# Patient Record
Sex: Male | Born: 1940 | Race: White | Hispanic: No | Smoking: Former smoker
Health system: Southern US, Community
[De-identification: ages and names within clinical notes are randomized; demographics above are authoritative.]

## PROBLEM LIST (undated history)

## (undated) ENCOUNTER — Emergency Department (HOSPITAL_COMMUNITY): Admission: EM | Payer: Medicare HMO

## (undated) DIAGNOSIS — H6090 Unspecified otitis externa, unspecified ear: Secondary | ICD-10-CM

## (undated) DIAGNOSIS — L97509 Non-pressure chronic ulcer of other part of unspecified foot with unspecified severity: Secondary | ICD-10-CM

## (undated) DIAGNOSIS — N400 Enlarged prostate without lower urinary tract symptoms: Secondary | ICD-10-CM

## (undated) DIAGNOSIS — D509 Iron deficiency anemia, unspecified: Secondary | ICD-10-CM

## (undated) DIAGNOSIS — K5790 Diverticulosis of intestine, part unspecified, without perforation or abscess without bleeding: Secondary | ICD-10-CM

## (undated) DIAGNOSIS — F32A Depression, unspecified: Secondary | ICD-10-CM

## (undated) DIAGNOSIS — M545 Low back pain, unspecified: Secondary | ICD-10-CM

## (undated) DIAGNOSIS — I10 Essential (primary) hypertension: Secondary | ICD-10-CM

## (undated) DIAGNOSIS — R161 Splenomegaly, not elsewhere classified: Secondary | ICD-10-CM

## (undated) DIAGNOSIS — M1990 Unspecified osteoarthritis, unspecified site: Secondary | ICD-10-CM

## (undated) DIAGNOSIS — I868 Varicose veins of other specified sites: Secondary | ICD-10-CM

## (undated) DIAGNOSIS — I509 Heart failure, unspecified: Secondary | ICD-10-CM

## (undated) DIAGNOSIS — I8500 Esophageal varices without bleeding: Secondary | ICD-10-CM

## (undated) DIAGNOSIS — A419 Sepsis, unspecified organism: Secondary | ICD-10-CM

## (undated) DIAGNOSIS — K7460 Unspecified cirrhosis of liver: Secondary | ICD-10-CM

## (undated) DIAGNOSIS — J449 Chronic obstructive pulmonary disease, unspecified: Secondary | ICD-10-CM

## (undated) DIAGNOSIS — N179 Acute kidney failure, unspecified: Secondary | ICD-10-CM

## (undated) DIAGNOSIS — J189 Pneumonia, unspecified organism: Secondary | ICD-10-CM

## (undated) DIAGNOSIS — D696 Thrombocytopenia, unspecified: Secondary | ICD-10-CM

## (undated) DIAGNOSIS — K3189 Other diseases of stomach and duodenum: Secondary | ICD-10-CM

## (undated) DIAGNOSIS — F329 Major depressive disorder, single episode, unspecified: Secondary | ICD-10-CM

## (undated) DIAGNOSIS — E785 Hyperlipidemia, unspecified: Secondary | ICD-10-CM

## (undated) DIAGNOSIS — K760 Fatty (change of) liver, not elsewhere classified: Secondary | ICD-10-CM

## (undated) DIAGNOSIS — R05 Cough: Secondary | ICD-10-CM

## (undated) DIAGNOSIS — K652 Spontaneous bacterial peritonitis: Secondary | ICD-10-CM

## (undated) DIAGNOSIS — K259 Gastric ulcer, unspecified as acute or chronic, without hemorrhage or perforation: Secondary | ICD-10-CM

## (undated) DIAGNOSIS — I700 Atherosclerosis of aorta: Secondary | ICD-10-CM

## (undated) DIAGNOSIS — E119 Type 2 diabetes mellitus without complications: Secondary | ICD-10-CM

## (undated) DIAGNOSIS — C220 Liver cell carcinoma: Secondary | ICD-10-CM

## (undated) DIAGNOSIS — K648 Other hemorrhoids: Secondary | ICD-10-CM

## (undated) DIAGNOSIS — C229 Malignant neoplasm of liver, not specified as primary or secondary: Secondary | ICD-10-CM

## (undated) DIAGNOSIS — Z5189 Encounter for other specified aftercare: Secondary | ICD-10-CM

## (undated) HISTORY — DX: Unspecified cirrhosis of liver: K74.60

## (undated) HISTORY — DX: Chronic obstructive pulmonary disease, unspecified: J44.9

## (undated) HISTORY — DX: Fatty (change of) liver, not elsewhere classified: K76.0

## (undated) HISTORY — DX: Heart failure, unspecified: I50.9

## (undated) HISTORY — DX: Esophageal varices without bleeding: I85.00

## (undated) HISTORY — DX: Unspecified osteoarthritis, unspecified site: M19.90

## (undated) HISTORY — DX: Spontaneous bacterial peritonitis: K65.2

## (undated) HISTORY — DX: Essential (primary) hypertension: I10

## (undated) HISTORY — DX: Liver cell carcinoma: C22.0

## (undated) HISTORY — DX: Splenomegaly, not elsewhere classified: R16.1

## (undated) HISTORY — DX: Encounter for other specified aftercare: Z51.89

## (undated) HISTORY — DX: Cough: R05

## (undated) HISTORY — PX: EYE SURGERY: SHX253

## (undated) HISTORY — DX: Non-pressure chronic ulcer of other part of unspecified foot with unspecified severity: L97.509

## (undated) HISTORY — DX: Benign prostatic hyperplasia without lower urinary tract symptoms: N40.0

## (undated) HISTORY — DX: Depression, unspecified: F32.A

## (undated) HISTORY — DX: Low back pain, unspecified: M54.50

## (undated) HISTORY — DX: Varicose veins of other specified sites: I86.8

## (undated) HISTORY — DX: Hyperlipidemia, unspecified: E78.5

## (undated) HISTORY — DX: Iron deficiency anemia, unspecified: D50.9

## (undated) HISTORY — DX: Atherosclerosis of aorta: I70.0

## (undated) HISTORY — DX: Type 2 diabetes mellitus without complications: E11.9

## (undated) HISTORY — DX: Diverticulosis of intestine, part unspecified, without perforation or abscess without bleeding: K57.90

## (undated) HISTORY — DX: Sepsis, unspecified organism: A41.9

## (undated) HISTORY — DX: Gastric ulcer, unspecified as acute or chronic, without hemorrhage or perforation: K25.9

## (undated) HISTORY — DX: Unspecified otitis externa, unspecified ear: H60.90

## (undated) HISTORY — DX: Low back pain: M54.5

## (undated) HISTORY — DX: Malignant neoplasm of liver, not specified as primary or secondary: C22.9

## (undated) HISTORY — DX: Thrombocytopenia, unspecified: D69.6

## (undated) HISTORY — DX: Other hemorrhoids: K64.8

## (undated) HISTORY — DX: Major depressive disorder, single episode, unspecified: F32.9

## (undated) HISTORY — DX: Other diseases of stomach and duodenum: K31.89

---

## 1978-10-10 HISTORY — PX: OTHER SURGICAL HISTORY: SHX169

## 2000-09-29 ENCOUNTER — Emergency Department (HOSPITAL_COMMUNITY): Admission: EM | Admit: 2000-09-29 | Discharge: 2000-09-29 | Payer: Self-pay | Admitting: Emergency Medicine

## 2000-09-29 ENCOUNTER — Encounter: Payer: Self-pay | Admitting: Emergency Medicine

## 2001-08-01 ENCOUNTER — Encounter: Payer: Self-pay | Admitting: Occupational Medicine

## 2001-08-01 ENCOUNTER — Encounter: Admission: RE | Admit: 2001-08-01 | Discharge: 2001-08-01 | Payer: Self-pay | Admitting: Occupational Medicine

## 2004-08-17 ENCOUNTER — Ambulatory Visit: Payer: Self-pay | Admitting: Internal Medicine

## 2004-12-30 ENCOUNTER — Ambulatory Visit: Payer: Self-pay | Admitting: Internal Medicine

## 2005-01-06 ENCOUNTER — Ambulatory Visit: Payer: Self-pay | Admitting: Internal Medicine

## 2005-07-08 ENCOUNTER — Ambulatory Visit: Payer: Self-pay | Admitting: Internal Medicine

## 2005-07-22 ENCOUNTER — Ambulatory Visit: Payer: Self-pay

## 2005-07-22 ENCOUNTER — Encounter: Payer: Self-pay | Admitting: Internal Medicine

## 2005-08-08 ENCOUNTER — Ambulatory Visit: Payer: Self-pay | Admitting: Internal Medicine

## 2005-10-11 ENCOUNTER — Ambulatory Visit: Payer: Self-pay | Admitting: Internal Medicine

## 2005-12-26 ENCOUNTER — Ambulatory Visit: Payer: Self-pay | Admitting: Internal Medicine

## 2006-01-02 ENCOUNTER — Ambulatory Visit: Payer: Self-pay | Admitting: Internal Medicine

## 2006-01-10 ENCOUNTER — Ambulatory Visit: Payer: Self-pay | Admitting: Internal Medicine

## 2006-04-03 ENCOUNTER — Ambulatory Visit: Payer: Self-pay | Admitting: Internal Medicine

## 2006-05-31 ENCOUNTER — Ambulatory Visit: Payer: Self-pay | Admitting: Internal Medicine

## 2006-06-06 ENCOUNTER — Ambulatory Visit: Payer: Self-pay | Admitting: Internal Medicine

## 2006-09-06 ENCOUNTER — Ambulatory Visit: Payer: Self-pay | Admitting: Internal Medicine

## 2006-11-14 ENCOUNTER — Ambulatory Visit: Payer: Self-pay | Admitting: Internal Medicine

## 2006-12-22 ENCOUNTER — Ambulatory Visit: Payer: Self-pay | Admitting: Gastroenterology

## 2006-12-27 ENCOUNTER — Ambulatory Visit: Payer: Self-pay | Admitting: Internal Medicine

## 2006-12-27 LAB — CONVERTED CEMR LAB
AST: 30 units/L (ref 0–37)
Alkaline Phosphatase: 64 units/L (ref 39–117)
BUN: 7 mg/dL (ref 6–23)
Basophils Relative: 0.2 % (ref 0.0–1.0)
Bilirubin, Direct: 0.1 mg/dL (ref 0.0–0.3)
CO2: 33 meq/L — ABNORMAL HIGH (ref 19–32)
Chloride: 98 meq/L (ref 96–112)
Eosinophils Absolute: 0.1 10*3/uL (ref 0.0–0.6)
Glucose, Bld: 118 mg/dL — ABNORMAL HIGH (ref 70–99)
HCT: 41.8 % (ref 39.0–52.0)
HDL: 35.8 mg/dL — ABNORMAL LOW (ref >39.0)
Hemoglobin: 14.5 g/dL (ref 13.0–17.0)
Hgb A1c MFr Bld: 6 % (ref 4.6–6.0)
MCHC: 34.7 g/dL (ref 30.0–36.0)
Microalb Creat Ratio: 9.8 mg/g (ref 0.0–30.0)
Neutro Abs: 3.3 10*3/uL (ref 1.4–7.7)
Neutrophils Relative %: 53.4 % (ref 43.0–77.0)
PSA: 1.78 ng/mL (ref 0.10–4.00)
Sodium: 136 meq/L (ref 135–145)
TSH: 1.08 microintl units/mL (ref 0.35–5.50)
Total CHOL/HDL Ratio: 3.4
Triglycerides: 155 mg/dL — ABNORMAL HIGH (ref 0–149)
WBC: 6 10*3/uL (ref 4.5–10.5)

## 2007-01-03 ENCOUNTER — Encounter: Payer: Self-pay | Admitting: Internal Medicine

## 2007-01-04 ENCOUNTER — Ambulatory Visit: Payer: Self-pay | Admitting: Internal Medicine

## 2007-04-06 DIAGNOSIS — E785 Hyperlipidemia, unspecified: Secondary | ICD-10-CM

## 2007-04-06 DIAGNOSIS — M1990 Unspecified osteoarthritis, unspecified site: Secondary | ICD-10-CM | POA: Insufficient documentation

## 2007-04-06 DIAGNOSIS — E1149 Type 2 diabetes mellitus with other diabetic neurological complication: Secondary | ICD-10-CM

## 2007-04-06 DIAGNOSIS — I10 Essential (primary) hypertension: Secondary | ICD-10-CM

## 2007-04-06 DIAGNOSIS — E1169 Type 2 diabetes mellitus with other specified complication: Secondary | ICD-10-CM

## 2007-04-26 ENCOUNTER — Ambulatory Visit: Payer: Self-pay | Admitting: Gastroenterology

## 2007-05-02 ENCOUNTER — Ambulatory Visit: Payer: Self-pay | Admitting: Internal Medicine

## 2007-05-02 DIAGNOSIS — M549 Dorsalgia, unspecified: Secondary | ICD-10-CM

## 2007-05-02 DIAGNOSIS — F329 Major depressive disorder, single episode, unspecified: Secondary | ICD-10-CM

## 2007-05-02 DIAGNOSIS — G8929 Other chronic pain: Secondary | ICD-10-CM

## 2007-05-03 ENCOUNTER — Ambulatory Visit: Payer: Self-pay

## 2007-05-03 ENCOUNTER — Encounter: Payer: Self-pay | Admitting: Internal Medicine

## 2007-05-18 ENCOUNTER — Ambulatory Visit: Payer: Self-pay | Admitting: Gastroenterology

## 2007-06-20 ENCOUNTER — Ambulatory Visit: Payer: Self-pay | Admitting: Internal Medicine

## 2007-06-20 DIAGNOSIS — N400 Enlarged prostate without lower urinary tract symptoms: Secondary | ICD-10-CM | POA: Insufficient documentation

## 2007-08-30 ENCOUNTER — Ambulatory Visit: Payer: Self-pay | Admitting: Internal Medicine

## 2007-08-30 DIAGNOSIS — M545 Low back pain: Secondary | ICD-10-CM

## 2007-08-30 DIAGNOSIS — G8929 Other chronic pain: Secondary | ICD-10-CM | POA: Insufficient documentation

## 2007-09-20 ENCOUNTER — Encounter: Payer: Self-pay | Admitting: Internal Medicine

## 2007-10-26 ENCOUNTER — Encounter: Payer: Self-pay | Admitting: Internal Medicine

## 2007-12-03 ENCOUNTER — Ambulatory Visit: Payer: Self-pay | Admitting: Internal Medicine

## 2007-12-03 LAB — CONVERTED CEMR LAB
Cholesterol, target level: 200 mg/dL
HDL goal, serum: 40 mg/dL

## 2008-01-25 ENCOUNTER — Telehealth: Payer: Self-pay | Admitting: *Deleted

## 2008-01-28 ENCOUNTER — Ambulatory Visit: Payer: Self-pay | Admitting: Internal Medicine

## 2008-01-28 LAB — CONVERTED CEMR LAB
Cholesterol: 193 mg/dL (ref 0–200)
LDL Cholesterol: 135 mg/dL — ABNORMAL HIGH (ref 0–99)
Triglycerides: 148 mg/dL (ref 0–149)

## 2008-02-07 ENCOUNTER — Ambulatory Visit: Payer: Self-pay | Admitting: Internal Medicine

## 2008-02-07 DIAGNOSIS — M1710 Unilateral primary osteoarthritis, unspecified knee: Secondary | ICD-10-CM | POA: Insufficient documentation

## 2008-02-14 ENCOUNTER — Encounter: Admission: RE | Admit: 2008-02-14 | Discharge: 2008-02-14 | Payer: Self-pay | Admitting: Internal Medicine

## 2008-02-19 ENCOUNTER — Encounter: Payer: Self-pay | Admitting: Internal Medicine

## 2008-03-13 ENCOUNTER — Ambulatory Visit: Payer: Self-pay | Admitting: Internal Medicine

## 2008-03-18 ENCOUNTER — Encounter: Payer: Self-pay | Admitting: Internal Medicine

## 2008-05-14 ENCOUNTER — Ambulatory Visit: Payer: Self-pay | Admitting: Internal Medicine

## 2008-05-14 LAB — CONVERTED CEMR LAB
Blood in Urine, dipstick: NEGATIVE
CO2: 27 meq/L (ref 19–32)
Chloride: 101 meq/L (ref 96–112)
Glucose, Bld: 122 mg/dL — ABNORMAL HIGH (ref 70–99)
Potassium: 4.2 meq/L (ref 3.5–5.1)
Sodium: 137 meq/L (ref 135–145)
Urobilinogen, UA: 1

## 2008-06-25 ENCOUNTER — Encounter: Payer: Self-pay | Admitting: Internal Medicine

## 2008-07-17 ENCOUNTER — Ambulatory Visit: Payer: Self-pay | Admitting: Internal Medicine

## 2008-08-14 ENCOUNTER — Ambulatory Visit: Payer: Self-pay | Admitting: Internal Medicine

## 2008-08-21 ENCOUNTER — Ambulatory Visit: Payer: Self-pay | Admitting: Internal Medicine

## 2008-08-28 ENCOUNTER — Ambulatory Visit: Payer: Self-pay | Admitting: Internal Medicine

## 2008-09-24 ENCOUNTER — Encounter: Payer: Self-pay | Admitting: Internal Medicine

## 2008-10-01 ENCOUNTER — Ambulatory Visit: Payer: Self-pay | Admitting: Internal Medicine

## 2008-10-01 DIAGNOSIS — T887XXA Unspecified adverse effect of drug or medicament, initial encounter: Secondary | ICD-10-CM

## 2008-10-01 DIAGNOSIS — R0609 Other forms of dyspnea: Secondary | ICD-10-CM

## 2008-10-01 DIAGNOSIS — R0989 Other specified symptoms and signs involving the circulatory and respiratory systems: Secondary | ICD-10-CM

## 2008-10-01 LAB — CONVERTED CEMR LAB
ALT: 18 units/L (ref 0–53)
AST: 26 units/L (ref 0–37)
Albumin: 3.3 g/dL — ABNORMAL LOW (ref 3.5–5.2)
BUN: 9 mg/dL (ref 6–23)
Basophils Absolute: 0.1 10*3/uL (ref 0.0–0.1)
Basophils Relative: 1 % (ref 0.0–3.0)
CO2: 31 meq/L (ref 19–32)
Calcium: 9.2 mg/dL (ref 8.4–10.5)
Creatinine, Ser: 0.7 mg/dL (ref 0.4–1.5)
Eosinophils Absolute: 0.1 10*3/uL (ref 0.0–0.7)
Eosinophils Relative: 1.7 % (ref 0.0–5.0)
GFR calc non Af Amer: 120 mL/min
HCT: 36.9 % — ABNORMAL LOW (ref 39.0–52.0)
Hemoglobin: 12.7 g/dL — ABNORMAL LOW (ref 13.0–17.0)
Hgb A1c MFr Bld: 6.7 % — ABNORMAL HIGH (ref 4.6–6.0)
MCHC: 34.3 g/dL (ref 30.0–36.0)
MCV: 95.4 fL (ref 78.0–100.0)
Neutro Abs: 4.9 10*3/uL (ref 1.4–7.7)
Pro B Natriuretic peptide (BNP): 24 pg/mL (ref 0.0–100.0)
RBC: 3.87 M/uL — ABNORMAL LOW (ref 4.22–5.81)
Total Bilirubin: 0.7 mg/dL (ref 0.3–1.2)
WBC: 7.2 10*3/uL (ref 4.5–10.5)

## 2008-10-08 ENCOUNTER — Encounter: Payer: Self-pay | Admitting: Internal Medicine

## 2008-10-08 ENCOUNTER — Ambulatory Visit: Payer: Self-pay

## 2008-10-14 ENCOUNTER — Encounter: Payer: Self-pay | Admitting: *Deleted

## 2008-11-12 ENCOUNTER — Ambulatory Visit: Payer: Self-pay | Admitting: Internal Medicine

## 2009-01-22 ENCOUNTER — Ambulatory Visit: Payer: Self-pay | Admitting: Internal Medicine

## 2009-01-22 LAB — CONVERTED CEMR LAB
BUN: 8 mg/dL (ref 6–23)
CO2: 31 meq/L (ref 19–32)
Calcium: 9 mg/dL (ref 8.4–10.5)
Chloride: 98 meq/L (ref 96–112)
Cholesterol: 183 mg/dL (ref 0–200)
Creatinine, Ser: 0.7 mg/dL (ref 0.4–1.5)

## 2009-05-06 ENCOUNTER — Ambulatory Visit: Payer: Self-pay | Admitting: Internal Medicine

## 2009-06-03 ENCOUNTER — Ambulatory Visit: Payer: Self-pay | Admitting: Internal Medicine

## 2009-06-03 DIAGNOSIS — J180 Bronchopneumonia, unspecified organism: Secondary | ICD-10-CM | POA: Insufficient documentation

## 2009-06-22 ENCOUNTER — Encounter: Admission: RE | Admit: 2009-06-22 | Discharge: 2009-06-22 | Payer: Self-pay | Admitting: Internal Medicine

## 2009-07-06 ENCOUNTER — Encounter: Admission: RE | Admit: 2009-07-06 | Discharge: 2009-07-06 | Payer: Self-pay | Admitting: Internal Medicine

## 2009-07-30 ENCOUNTER — Ambulatory Visit: Payer: Self-pay | Admitting: Internal Medicine

## 2009-07-30 LAB — CONVERTED CEMR LAB
BUN: 11 mg/dL (ref 6–23)
Basophils Absolute: 0 10*3/uL (ref 0.0–0.1)
CO2: 27 meq/L (ref 19–32)
Calcium: 8.9 mg/dL (ref 8.4–10.5)
Creatinine, Ser: 0.8 mg/dL (ref 0.4–1.5)
Eosinophils Absolute: 0.1 10*3/uL (ref 0.0–0.7)
GFR calc non Af Amer: 102.02 mL/min (ref >60)
Glucose, Bld: 144 mg/dL — ABNORMAL HIGH (ref 70–99)
Lymphocytes Relative: 33.1 % (ref 12.0–46.0)
MCHC: 34 g/dL (ref 30.0–36.0)
Monocytes Relative: 6.6 % (ref 3.0–12.0)
Neutro Abs: 3.6 10*3/uL (ref 1.4–7.7)
Neutrophils Relative %: 58.8 % (ref 43.0–77.0)
Platelets: 187 10*3/uL (ref 150.0–400.0)
RDW: 15.6 % — ABNORMAL HIGH (ref 11.5–14.6)
Sodium: 137 meq/L (ref 135–145)

## 2009-08-04 ENCOUNTER — Ambulatory Visit: Payer: Self-pay | Admitting: Cardiology

## 2009-08-12 ENCOUNTER — Ambulatory Visit: Payer: Self-pay | Admitting: Internal Medicine

## 2009-08-12 DIAGNOSIS — R059 Cough, unspecified: Secondary | ICD-10-CM | POA: Insufficient documentation

## 2009-08-12 DIAGNOSIS — R05 Cough: Secondary | ICD-10-CM

## 2009-08-31 ENCOUNTER — Ambulatory Visit: Payer: Self-pay | Admitting: Internal Medicine

## 2009-09-21 ENCOUNTER — Ambulatory Visit: Payer: Self-pay | Admitting: Internal Medicine

## 2009-09-22 ENCOUNTER — Telehealth (INDEPENDENT_AMBULATORY_CARE_PROVIDER_SITE_OTHER): Payer: Self-pay | Admitting: *Deleted

## 2009-10-27 ENCOUNTER — Telehealth: Payer: Self-pay | Admitting: Internal Medicine

## 2009-11-02 ENCOUNTER — Ambulatory Visit: Payer: Self-pay | Admitting: Internal Medicine

## 2009-11-03 ENCOUNTER — Ambulatory Visit: Payer: Self-pay | Admitting: Internal Medicine

## 2009-12-09 ENCOUNTER — Encounter: Payer: Self-pay | Admitting: Internal Medicine

## 2009-12-15 ENCOUNTER — Ambulatory Visit: Payer: Self-pay | Admitting: Internal Medicine

## 2009-12-15 DIAGNOSIS — J449 Chronic obstructive pulmonary disease, unspecified: Secondary | ICD-10-CM

## 2009-12-15 HISTORY — DX: Chronic obstructive pulmonary disease, unspecified: J44.9

## 2009-12-17 ENCOUNTER — Encounter: Payer: Self-pay | Admitting: Internal Medicine

## 2009-12-18 ENCOUNTER — Ambulatory Visit: Payer: Self-pay | Admitting: Cardiology

## 2010-01-04 ENCOUNTER — Ambulatory Visit: Payer: Self-pay | Admitting: Internal Medicine

## 2010-01-04 LAB — CONVERTED CEMR LAB
ALT: 18 units/L (ref 0–53)
AST: 25 units/L (ref 0–37)
Albumin: 3.7 g/dL (ref 3.5–5.2)
Alkaline Phosphatase: 82 units/L (ref 39–117)
BUN: 10 mg/dL (ref 6–23)
Bilirubin, Direct: 0 mg/dL (ref 0.0–0.3)
CO2: 30 meq/L (ref 19–32)
Calcium: 9.1 mg/dL (ref 8.4–10.5)
Chloride: 106 meq/L (ref 96–112)
Cholesterol: 182 mg/dL (ref 0–200)
Creatinine, Ser: 0.8 mg/dL (ref 0.4–1.5)
Direct LDL: 117.8 mg/dL
GFR calc non Af Amer: 101.89 mL/min (ref >60)
Glucose, Bld: 96 mg/dL (ref 70–99)
HDL: 35.4 mg/dL — ABNORMAL LOW (ref >39.00)
Hgb A1c MFr Bld: 6 % (ref 4.6–6.5)
PSA: 2.15 ng/mL (ref 0.10–4.00)
Potassium: 4.8 meq/L (ref 3.5–5.1)
Sodium: 143 meq/L (ref 135–145)
TSH: 4.18 microintl units/mL (ref 0.35–5.50)
Total Bilirubin: 0.4 mg/dL (ref 0.3–1.2)
Total CHOL/HDL Ratio: 5
Total Protein: 6.6 g/dL (ref 6.0–8.3)
Triglycerides: 241 mg/dL — ABNORMAL HIGH (ref 0.0–149.0)
VLDL: 48.2 mg/dL — ABNORMAL HIGH (ref 0.0–40.0)

## 2010-01-11 ENCOUNTER — Ambulatory Visit: Payer: Self-pay | Admitting: Internal Medicine

## 2010-02-11 ENCOUNTER — Ambulatory Visit: Payer: Self-pay | Admitting: Internal Medicine

## 2010-02-11 ENCOUNTER — Telehealth: Payer: Self-pay | Admitting: Internal Medicine

## 2010-03-10 ENCOUNTER — Telehealth: Payer: Self-pay | Admitting: Internal Medicine

## 2010-03-18 ENCOUNTER — Ambulatory Visit: Payer: Self-pay | Admitting: Internal Medicine

## 2010-04-23 ENCOUNTER — Ambulatory Visit: Payer: Self-pay | Admitting: Internal Medicine

## 2010-04-23 LAB — CONVERTED CEMR LAB
CO2: 30 meq/L (ref 19–32)
Calcium: 8.8 mg/dL (ref 8.4–10.5)
Creatinine, Ser: 1.2 mg/dL (ref 0.4–1.5)
GFR calc non Af Amer: 65.01 mL/min (ref >60)
Glucose, Bld: 77 mg/dL (ref 70–99)

## 2010-06-28 ENCOUNTER — Telehealth: Payer: Self-pay | Admitting: Internal Medicine

## 2010-07-26 ENCOUNTER — Telehealth: Payer: Self-pay | Admitting: Internal Medicine

## 2010-08-11 ENCOUNTER — Ambulatory Visit: Payer: Self-pay | Admitting: Internal Medicine

## 2010-08-11 ENCOUNTER — Encounter: Payer: Self-pay | Admitting: Internal Medicine

## 2010-09-18 ENCOUNTER — Encounter: Payer: Self-pay | Admitting: *Deleted

## 2010-09-28 ENCOUNTER — Encounter: Payer: Self-pay | Admitting: Internal Medicine

## 2010-10-22 ENCOUNTER — Encounter: Payer: Self-pay | Admitting: *Deleted

## 2010-11-11 NOTE — Assessment & Plan Note (Signed)
Summary: MED CK / REFILL // RS   Vital Signs:  Patient profile:   70 year old male Height:      72 inches Weight:      214 pounds BMI:     29.13 Temp:     98.2 degrees F oral Pulse rate:   76 / minute Resp:     14 per minute BP sitting:   118 / 70  (left arm)  Vitals Entered By: Allyne Gee, LPN (November  2, 624THL 3:28 PM) CC: roa Is Patient Diabetic? Yes Did you bring your meter with you today? No   Primary Care Mylinda Brook:  Dr. Benay Pillow  CC:  roa.  History of Present Illness: folllow up for chronic pain management I have spent greater that 30 min face to face evaluating this patient of which 1/2 was in counsilling about pain medicatiosn and their risks  Preventive Screening-Counseling & Management  Alcohol-Tobacco     Smoking Status: quit     Packs/Day: 4.0     Year Started: 1960     Year Quit: 1985     Tobacco Counseling: to remain off tobacco products  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  (ICD-715.36) 7)  Dyspnea On Exertion  (ICD-786.09) 8)  Uns Advrs Eff Uns Rx Medicinal&biological Sbstnc  (ICD-995.20) 9)  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  (ICD-715.36) 10)  Low Back Pain  (ICD-724.2) 11)  Benign Prostatic Hypertrophy  (ICD-600.00) 12)  Back Pain, Chronic  (ICD-724.5) 13)  Depression  (ICD-311) 14)  Diabetes Mellitus, Type II  (ICD-250.00) 15)  Family History of Melanoma  (ICD-V16.8) 16)  Family History Diabetes 1st Degree Relative  (ICD-V18.0) 17)  Dm  (ICD-250.00) 18)  Degenerative Joint Disease  (ICD-715.90) 19)  Hypertension  (ICD-401.9) 20)  Hyperlipidemia  (ICD-272.4)  Current Problems (verified): 1)  Other Iatrogenic Hypotension  (ICD-458.29) 2)  Preventive Health Care  (ICD-V70.0) 3)  COPD  Unspecified  (ICD-496) 4)  Cough  (ICD-786.2) 5)  Bronchopneumonia Organism Unspecified  (ICD-485) 6)  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  (ICD-715.36) 7)  Dyspnea On Exertion  (ICD-786.09) 8)  Uns Advrs Eff Uns Rx Medicinal&biological Sbstnc  (ICD-995.20) 9)  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 Take 1 Tablet By Mouth Once A Day 2)  Glucovance 2.5-500 Mg  Tabs (Glyburide-Metformin) .... Take 1 Tablet By Mouth Two Times A Day 3)  Amitriptyline Hcl 25 Mg  Tabs (Amitriptyline Hcl) .... One By Mouth Q Hs 4)  Prilosec 20 Mg Cpdr (Omeprazole) .... Take  One 30-60 Min Before First Meal of The Day 5)  Prozac 20 Mg Caps (Fluoxetine Hcl) .... One By Mouth Q Am 6)  Norvasc 5 Mg  Tabs (Amlodipine Besylate) .Marland KitchenMarland KitchenMarland Kitchen  Once Daily 7)  Finasteride 5 Mg Tabs (Finasteride) .Marland Kitchen.. 1 Once Daily 8)  Flomax 0.4 Mg  Cp24 (Tamsulosin Hcl) .... One By Mouth Daily 9)  Neurontin 300 Mg  Caps (Gabapentin) .... One By Mouth Twice Daily 10)  Naprosyn 500 Mg  Tabs (Naproxen) .... Take 1 Tablet By Mouth Two Times A Day 11)  Vitamin D3 1000 Unit Caps (Cholecalciferol) .... 2 Once Daily 12)  Benicar 40 Mg Tabs (Olmesartan Medoxomil) .Marland Kitchen.. 1 Once Daily 13)  Oxycontin 30 Mg Xr12h-Tab (Oxycodone Hcl) .Marland Kitchen.. 1 Two Times A Day 14)  Percocet 5-325 Mg Tabs (Oxycodone-Acetaminophen) .Marland Kitchen.. 1 Three Times A Day  Current Medications (verified): 1)  Aspirin 81 Mg  Tabs (Aspirin) .... Take 1 Tablet By Mouth Once A  Day 2)  Glucovance 2.5-500 Mg  Tabs (Glyburide-Metformin) .... Take 1 Tablet By Mouth Two Times A Day 3)  Amitriptyline Hcl 25 Mg  Tabs (Amitriptyline Hcl) .... One By Mouth Q Hs 4)  Prilosec 20 Mg Cpdr (Omeprazole) .... Take  One 30-60 Min Before First Meal of The Day 5)  Prozac 20 Mg Caps (Fluoxetine Hcl) .... One By Mouth Q Am 6)  Norvasc 5 Mg  Tabs (Amlodipine Besylate) .... Once Daily 7)  Finasteride 5 Mg Tabs (Finasteride) .Marland Kitchen.. 1 Once Daily 8)  Flomax 0.4 Mg  Cp24 (Tamsulosin Hcl) .... One By Mouth Daily 9)  Neurontin 300 Mg  Caps (Gabapentin) .... One By Mouth Twice Daily 10)  Naprosyn 500 Mg  Tabs (Naproxen) .... Take 1 Tablet By Mouth Two Times A Day 11)  Vitamin D3 1000 Unit Caps (Cholecalciferol) .... 2 Once Daily 12)  Benicar 40 Mg Tabs (Olmesartan Medoxomil) .Marland Kitchen.. 1 Once Daily 13)  Percocet 5-325 Mg Tabs (Oxycodone-Acetaminophen) .... One By Mouth Every 6 Hours As Needed For Pain  Allergies (verified): No Known Drug Allergies  Past History:  Family History: Last updated: 08/12/2009 Family History Diabetes 1st degree relative Family History of Melanoma Family History of Stroke M 1st degree relative <50 Fam hx fibromyalgia Negative for respiratory diseases or atopy   Social History: Last updated: 08/12/2009 Married Children Retired from [employer] work.  Previous exposure to chemicals at his job. Former Smoker.  Quit in 1985.  Smoked for 15-20 yrs up to 3-45 ppd. Alcohol use-no Drug use-no  Risk Factors: Exercise: no (06/20/2007)  Risk Factors: Smoking Status: quit (08/11/2010) Packs/Day: 4.0 (08/11/2010)  Past medical, surgical, family and social histories (including risk factors) reviewed, and no changes noted (except as noted below).  Past Medical History: Reviewed history from 12/18/2009 and no changes required. Hyperlipidemia Hypertension    - Try off ACE August 12, 2009 for upper airway  cough Cough...................................................................................Marland KitchenWert     - Sinus ct December 18, 2009 :  Minor mucosal thickening at the right frontoethmoidal recess, otherwise negative sinuses. COPD     - PFT's December 15, 2009 FEV1 2.30 (70%) ratio 63 no better with B2 and DLC0 18/6 (73%) corrects to 106% Diabetes mellitus, type II degenerative joint disease Depression Benign prostatic hypertrophy Low back pain  Past Surgical History: Reviewed history from 04/06/2007 and no changes required. [id number]  Family History: Reviewed history from 08/12/2009 and no changes required. Family History Diabetes 1st degree relative Family History of Melanoma Family History of Stroke M 1st degree relative <50 Fam hx fibromyalgia Negative for respiratory diseases or atopy   Social History: Reviewed history from 08/12/2009 and no changes required. Married Children Retired from [employer] work.  Previous exposure to chemicals  at his job. Former Smoker.  Quit in 1985.  Smoked for 15-20 yrs up to 3-45 ppd. Alcohol use-no Drug use-no  Review of Systems       The patient complains of muscle weakness and difficulty walking.  The patient denies anorexia, fever, weight loss, weight gain, vision loss, decreased hearing, hoarseness, chest pain, syncope, dyspnea on exertion, peripheral edema, prolonged cough, headaches, hemoptysis, abdominal pain, melena, hematochezia, severe indigestion/heartburn, hematuria, incontinence, genital sores, suspicious skin lesions, transient blindness, depression, unusual weight change, abnormal bleeding, enlarged lymph nodes, angioedema, breast masses, and testicular masses.    Physical Exam  General:  alert and well-hydrated.   Head:  normocephalic and atraumatic.   Eyes:  pupils equal and pupils round.   Ears:  R ear normal and L ear normal.   Nose:  no external deformity and no nasal discharge.   Mouth:  pharynx pink and moist and no  erythema.   Neck:  supple and full ROM.   Lungs:  normal respiratory effort.   increased inspiratory toe exp ratio no wheezing and egophany has cleared Heart:  normal rate and regular rhythm.   Abdomen:  soft, non-tender, and distended.   Msk:  joint tenderness and lumbar lordosis.     Impression & Recommendations:  Problem # 1:  COPD UNSPECIFIED (ICD-496)  Pulmonary Functions Reviewed:  will order PFT O2 sat: 97 (12/15/2009)     Vaccines Reviewed: Pneumovax: Pneumovax (Medicare) (08/31/2009)   Flu Vax: Historical (07/10/2010)  Pulmonary Functions Reviewed: O2 sat: 97 (12/15/2009)     Vaccines Reviewed: Pneumovax: Pneumovax (Medicare) (08/31/2009)   Flu Vax: Historical (07/10/2010)  Orders: Spirometry w/Graph (94010)  Problem # 2:  DYSPNEA ON EXERTION (ICD-786.09)  COPD  Recommended fluid and salt restriction.   Problem # 3:  LOW BACK PAIN (ICD-724.2)  The following medications were removed from the medication list:    Oxycontin 30 Mg Xr12h-tab (Oxycodone hcl) .Marland Kitchen... 1 two times a day His updated medication list for this problem includes:    Aspirin 81 Mg Tabs (Aspirin) .Marland Kitchen... Take 1 tablet by mouth once a day    Naprosyn 500 Mg Tabs (Naproxen) .Marland Kitchen... Take 1 tablet by mouth two times a day    Percocet 5-325 Mg Tabs (Oxycodone-acetaminophen) ..... One by mouth every 6 hours as needed for pain  Discussed use of moist heat or ice, modified activities, medications, and stretching/strengthening exercises. Back care instructions given. To be seen in 2 weeks if no improvement; sooner if worsening of symptoms.   Problem # 4:  BACK PAIN, CHRONIC (ICD-724.5)  The following medications were removed from the medication list:    Oxycontin 30 Mg Xr12h-tab (Oxycodone hcl) .Marland Kitchen... 1 two times a day His updated medication list for this problem includes:    Aspirin 81 Mg Tabs (Aspirin) .Marland Kitchen... Take 1 tablet by mouth once a day    Naprosyn 500 Mg Tabs (Naproxen) .Marland Kitchen... Take 1 tablet by mouth  two times a day    Percocet 5-325 Mg Tabs (Oxycodone-acetaminophen) ..... One by mouth every 6 hours as needed for pain  Discussed use of moist heat or ice, modified activities, medications, and stretching/strengthening exercises. Back care instructions given. To be seen in 2 weeks if no improvement; sooner if worsening of symptoms.   Complete Medication List: 1)  Aspirin 81 Mg Tabs (Aspirin) .... Take 1 tablet by mouth once a day 2)  Glucovance 2.5-500 Mg Tabs (Glyburide-metformin) .... Take 1 tablet by mouth two times a day 3)  Amitriptyline  Hcl 25 Mg Tabs (Amitriptyline hcl) .... One by mouth q hs 4)  Prilosec 20 Mg Cpdr (Omeprazole) .... Take  one 30-60 min before first meal of the day 5)  Prozac 20 Mg Caps (Fluoxetine hcl) .... One by mouth q am 6)  Norvasc 5 Mg Tabs (Amlodipine besylate) .... Once daily 7)  Finasteride 5 Mg Tabs (Finasteride) .Marland Kitchen.. 1 once daily 8)  Flomax 0.4 Mg Cp24 (Tamsulosin hcl) .... One by mouth daily 9)  Neurontin 300 Mg Caps (Gabapentin) .... One by mouth twice daily 10)  Naprosyn 500 Mg Tabs (Naproxen) .... Take 1 tablet by mouth two times a day 11)  Vitamin D3 1000 Unit Caps (Cholecalciferol) .... 2 once daily 12)  Benicar 40 Mg Tabs (Olmesartan medoxomil) .Marland Kitchen.. 1 once daily 13)  Percocet 5-325 Mg Tabs (Oxycodone-acetaminophen) .... One by mouth every 6 hours as needed for pain  Patient Instructions: 1)  Please schedule a follow-up appointment in 3 months. Prescriptions: PERCOCET 5-325 MG TABS (OXYCODONE-ACETAMINOPHEN) One by mouth every 6 hours as needed for pain  #120 x 0   Entered and Authorized by:   Ricard Dillon MD   Signed by:   Ricard Dillon MD on 08/11/2010   Method used:   Print then Give to Patient   RxID:   [id number] PERCOCET 5-325 MG TABS (OXYCODONE-ACETAMINOPHEN) One by mouth every 6 hours as needed for pain  #120 x 0   Entered and Authorized by:   Ricard Dillon MD   Signed by:   Ricard Dillon MD on 08/11/2010   Method used:    Print then Give to Patient   RxID:   [id number] PERCOCET 5-325 MG TABS (OXYCODONE-ACETAMINOPHEN) One by mouth every 6 hours as needed for pain  #120 x 0   Entered and Authorized by:   Ricard Dillon MD   Signed by:   Ricard Dillon MD on 08/11/2010   Method used:   Print then Give to Patient   RxID:   [id number]    Orders Added: 1)  Spirometry w/Graph [94010] 2)  Est. Patient Level IV [id number]   Immunization History:  Influenza Immunization History:    Influenza:  historical (07/10/2010)   Immunization History:  Influenza Immunization History:    Influenza:  Historical (07/10/2010)

## 2010-11-11 NOTE — Letter (Signed)
Summary: [facility]   [facility]   Imported By: Laural Benes 10/18/2010 12:17:05  _____________________________________________________________________  External Attachment:    Type:   Image     Comment:   External Document

## 2010-11-11 NOTE — Progress Notes (Signed)
Summary: REFILL REQUEST  Phone Note Refill Request Message from:  Patient  [phone] on July 26, 2010 9:58 AM  Refills Requested: Medication #1:  OXYCONTIN 30 MG XR12H-TAB 1 two times a day   Notes: Pt adv he will run out of med on 10/19 but he doesn't have an appt for med ck / refill till  11/02.... Pt would like refill so he will have enough of med to do him till his appt on 11/02.... Pt can be reached at  [phone].    Initial call taken by: Duanne Moron,  July 26, 2010 10:01 AM    Prescriptions: PERCOCET 5-325 MG TABS (OXYCODONE-ACETAMINOPHEN) 1 three times a day  #90 x 0   Entered by:   Allyne Gee, LPN   Authorized by:   Ricard Dillon MD   Signed by:   Allyne Gee, LPN on [id number]   Method used:   Print then Give to Patient   RxID:   [id number]

## 2010-11-11 NOTE — Progress Notes (Signed)
Summary: benzonatate refill  Phone Note Refill Request Message from:  Fax from Pharmacy on March 10, 2010 3:21 PM  Refills Requested: Medication #1:  BENZONATATE 200 MG CAPS one by mouth q 6 hour as needed cough Initial call taken by: Westley Hummer CMA Deborra Medina),  March 10, 2010 3:21 PM    Prescriptions: BENZONATATE 200 MG CAPS (BENZONATATE) one by mouth q 6 hour as needed cough  #90 x 1   Entered by:   Westley Hummer CMA ([place])   Authorized by:   Ricard Dillon MD   Signed by:   Westley Hummer CMA ([place]) on 03/10/2010   Method used:   Electronically to        [facility] (retail)       [place]  [address]       Ph: [phone]       Fax: [phone]   RxID:   [id number]

## 2010-11-11 NOTE — Medication Information (Signed)
Summary: Rx for Benicar  Rx for Benicar   Imported By: Laural Benes 12/11/2009 14:42:29  _____________________________________________________________________  External Attachment:    Type:   Image     Comment:   External Document

## 2010-11-11 NOTE — Assessment & Plan Note (Signed)
Summary: 1 month rov/njr   Vital Signs:  Patient profile:   70 year old male Height:      72 inches Weight:      212 pounds BMI:     28.86 Temp:     98.2 degrees F oral Pulse rate:   68 / minute Resp:     14 per minute BP sitting:   122 / 64  (left arm) BP standing:   100 / 56  (left arm)  Vitals Entered By: Allyne Gee, LPN (June  9, 624THL 624THL AM)  Nutrition Counseling: Patient's BMI is greater than 25 and therefore counseled on weight management options. CC: roa- c/o unsteadiness on feet after standing up at times, Hypertension Management   Primary Care Provider:  Dr. Benay Pillow  CC:  roa- c/o unsteadiness on feet after standing up at times and Hypertension Management.  History of Present Illness: DIZZY WITH STANDING The pt has orthostatic changes The pt has been taking two arb's he has not chest pain, SOB or mental staus changes His DM has been stable   Hypertension History:      He complains of dyspnea with exertion and peripheral edema, but denies headache, chest pain, palpitations, orthopnea, PND, visual symptoms, neurologic problems, syncope, and side effects from treatment.  trace edema with orhtstasis.        Positive major cardiovascular risk factors include male age 70 years old or older, diabetes, hyperlipidemia, and hypertension.  Negative major cardiovascular risk factors include non-tobacco-user status.     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 Take 1 Tablet By Mouth Once A Day 2)  Glucovance 2.5-500 Mg  Tabs (Glyburide-Metformin) .... Take 1 Tablet By Mouth Two Times A Day 3)  Acyclovir 800 Mg  Tabs (Acyclovir) .... Take 1 Tablet By Mouth Once A Day 4)  Amitriptyline Hcl 25 Mg  Tabs (Amitriptyline Hcl) .... One By Mouth Q Hs 5)  Prilosec 20 Mg Cpdr (Omeprazole) .... Take  One 30-60 Min Before First Meal of The Day 6)  Prozac 20 Mg Caps (Fluoxetine Hcl) .... One By Mouth Q Am 7)  Norvasc 5 Mg  Tabs (Amlodipine Besylate) .... Once Daily 8)  Finasteride 5 Mg Tabs (Finasteride) .Marland Kitchen.. 1 Once Daily 9)  Flomax 0.4 Mg  Cp24 (Tamsulosin Hcl) .... One By Mouth Daily 10)  Neurontin 300 Mg  Caps (Gabapentin) .... One By Mouth Twice Daily 11)  Naprosyn 500 Mg  Tabs (Naproxen) .... Take 1 Tablet By Mouth Two Times A Day 12)  Vitamin D3 1000 Unit Caps (Cholecalciferol) .... 2 Once Daily 13)  Benzonatate 200 Mg Caps (Benzonatate) .... One By Mouth Q 6 Hour As Needed Cough 14)  Losartan Potassium 100 Mg Tabs (Losartan Potassium) .Marland Kitchen.. 1 Once Daily 15)  Benicar 40 Mg Tabs (Olmesartan Medoxomil) .Marland Kitchen.. 1 Once Daily 16)  Oxycodone Hcl 15 Mg Tabs (Oxycodone Hcl) .... One By Mouth Qid  Current Medications (verified): 1)  Aspirin 81 Mg  Tabs (Aspirin) .... Take 1 Tablet By Mouth Once A Day 2)  Glucovance 2.5-500 Mg  Tabs (Glyburide-Metformin) .... Take 1 Tablet By Mouth Two Times A Day 3)  Acyclovir 800 Mg  Tabs (Acyclovir) .... Take 1 Tablet By  Mouth Once A Day 4)  Amitriptyline Hcl 25 Mg  Tabs (Amitriptyline Hcl) .... One By Mouth Q Hs 5)  Prilosec 20 Mg Cpdr (Omeprazole) .... Take  One 30-60 Min Before First Meal of The Day 6)  Prozac 20 Mg Caps (Fluoxetine Hcl) .... One By Mouth Q Am 7)  Norvasc 5 Mg  Tabs (Amlodipine Besylate) .... Once Daily 8)  Finasteride 5 Mg Tabs (Finasteride) .Marland Kitchen.. 1 Once Daily 9)  Flomax 0.4 Mg  Cp24 (Tamsulosin Hcl) .... One By Mouth Daily 10)  Neurontin 300 Mg  Caps (Gabapentin) .... One By Mouth Twice Daily 11)  Naprosyn 500 Mg  Tabs (Naproxen) .... Take 1 Tablet By Mouth Two Times A Day 12)  Vitamin D3 1000 Unit Caps (Cholecalciferol) .... 2 Once Daily 13)  Benzonatate 200 Mg Caps (Benzonatate) .... One By Mouth Q 6 Hour As Needed Cough 14)  Benicar 40 Mg Tabs (Olmesartan Medoxomil) .Marland Kitchen.. 1 Once Daily 15)  Oxycodone Hcl 15 Mg Tabs (Oxycodone Hcl) .... One By Mouth Qid  Allergies (verified): No Known Drug Allergies  Past History:  Family History: Last updated: 08/12/2009 Family History Diabetes 1st degree relative Family History of Melanoma Family History of Stroke M 1st degree relative <50 Fam hx fibromyalgia Negative for respiratory diseases or atopy   Social History: Last updated: 08/12/2009 Married Children Retired from [employer] work.  Previous exposure to chemicals at his job. Former Smoker.  Quit in 1985.  Smoked for 15-20 yrs up to 3-45 ppd. Alcohol use-no Drug use-no  Risk Factors: Exercise: no (06/20/2007)  Risk Factors: Smoking Status: quit (03/18/2010) Packs/Day: 4.0 (03/18/2010)  Past medical, surgical, family and social histories (including risk factors) reviewed, and no changes noted (except as noted below).  Past Medical History: Reviewed history from 12/18/2009 and no changes required. Hyperlipidemia Hypertension    - Try off ACE August 12, 2009 for upper airway  cough Cough...................................................................................Marland KitchenWert     - Sinus ct December 18, 2009 :  Minor mucosal thickening at the right frontoethmoidal recess, otherwise negative sinuses. COPD     - PFT's December 15, 2009 FEV1 2.30 (70%) ratio 63 no better with B2 and DLC0 18/6 (73%) corrects to 106% Diabetes mellitus, type II degenerative joint disease Depression Benign prostatic hypertrophy Low back pain  Past Surgical History: Reviewed history from 04/06/2007 and no changes required. [id number]  Family History: Reviewed history from 08/12/2009 and no changes required. Family History Diabetes 1st degree relative Family History of Melanoma Family History of Stroke M 1st degree relative <50 Fam hx fibromyalgia Negative for respiratory diseases or atopy   Social History: Reviewed history from 08/12/2009 and no changes required. Married Children Retired from [employer] work.  Previous exposure to chemicals at his job. Former Smoker.  Quit in 1985.  Smoked for 15-20 yrs up to 3-45 ppd. Alcohol use-no Drug use-no  Review of Systems       The patient complains of syncope, dyspnea on exertion, and peripheral edema.  The patient denies anorexia, fever, weight loss, weight gain, vision loss, decreased hearing, hoarseness, chest pain, prolonged cough, headaches, hemoptysis, abdominal pain, melena, hematochezia, severe indigestion/heartburn, hematuria, incontinence, genital sores, muscle weakness, suspicious skin lesions, transient blindness, difficulty walking, depression, unusual weight change, abnormal bleeding, enlarged lymph nodes, angioedema, breast masses, and testicular masses.    Physical Exam  General:  alert and well-hydrated.   Head:  normocephalic and atraumatic.   Eyes:  pupils equal and pupils round.   Ears:  R ear normal and L ear normal.   Nose:  no external deformity and no nasal discharge.   Neck:  supple and full ROM.   Lungs:   normal respiratory effort.   increased inspiratory toe exp ratio no wheezing and egophany has cleared Heart:  normal rate and regular rhythm.   Abdomen:  soft, non-tender, and normal bowel sounds.  distend  and tense Msk:  normal ROM and no joint tenderness.   Pulses:  decreased Extremities:  trace left pedal edema and trace right pedal edema.   Neurologic:  alert & oriented X3 and finger-to-nose normal.     Impression & Recommendations:  Problem # 1:  HYPERTENSION (ICD-401.9) due to confusion he stayed on the benicar when his insurance company mandated a change to losartin he took both The following medications were removed from the medication list:    Losartan Potassium 100 Mg Tabs (Losartan potassium) .Marland Kitchen... 1 once daily His updated medication list for this problem includes:    Norvasc 5 Mg Tabs (Amlodipine besylate) ..... Once daily    Benicar 40 Mg Tabs (Olmesartan medoxomil) .Marland Kitchen... 1 once daily  BP today: 122/64 Prior BP: 130/74 (02/11/2010)  10 Yr Risk Heart Disease: 27 % Prior 10 Yr Risk Heart Disease: 33 % (01/11/2010)  Labs Reviewed: K+: 4.8 (01/04/2010) Creat: : 0.8 (01/04/2010)   Chol: 182 (01/04/2010)   HDL: 35.40 (01/04/2010)   LDL: 135 (01/28/2008)   TG: 241.0 (01/04/2010)  Problem # 2:  COPD UNSPECIFIED (ICD-496) stable  Problem # 3:  LOC OSTEOARTHROS NOT SPEC PRIM/SEC LOWER LEG (ICD-715.36)  His updated medication list for this problem includes:    Aspirin 81 Mg Tabs (Aspirin) .Marland Kitchen... Take 1 tablet by mouth once a day    Naprosyn 500 Mg Tabs (Naproxen) .Marland Kitchen... Take 1 tablet by mouth two times a day    Oxycodone Hcl 15 Mg Tabs (Oxycodone hcl) ..... One by mouth qid  Discussed use of medications, application of heat or cold, and exercises.   Problem # 4:  DM (ICD-250.00)  The following medications were removed from the medication list:    Losartan Potassium 100 Mg Tabs (Losartan potassium) .Marland Kitchen... 1 once daily His updated medication list for this problem  includes:    Aspirin 81 Mg Tabs (Aspirin) .Marland Kitchen... Take 1 tablet by mouth once a day    Glucovance 2.5-500 Mg Tabs (Glyburide-metformin) .Marland Kitchen... Take 1 tablet by mouth two times a day    Benicar 40 Mg Tabs (Olmesartan medoxomil) .Marland Kitchen... 1 once daily  Labs Reviewed: Creat: 0.8 (01/04/2010)     Last Eye Exam: see office note from opthamolgist for details (09/26/2007) Reviewed HgBA1c results: 6.0 (01/04/2010)  6.6 (01/22/2009)  Complete Medication List: 1)  Aspirin  81 Mg Tabs (Aspirin) .... Take 1 tablet by mouth once a day 2)  Glucovance 2.5-500 Mg Tabs (Glyburide-metformin) .... Take 1 tablet by mouth two times a day 3)  Acyclovir 800 Mg Tabs (Acyclovir) .... Take 1 tablet by mouth once a day 4)  Amitriptyline Hcl 25 Mg Tabs (Amitriptyline hcl) .... One by mouth q hs 5)  Prilosec 20 Mg Cpdr (Omeprazole) .... Take  one 30-60 min before first meal of the day 6)  Prozac 20 Mg Caps (Fluoxetine hcl) .... One by mouth q am 7)  Norvasc 5 Mg Tabs (Amlodipine besylate) .... Once daily 8)  Finasteride 5 Mg Tabs (Finasteride) .Marland Kitchen.. 1 once daily 9)  Flomax 0.4 Mg Cp24 (Tamsulosin hcl) .... One by mouth daily 10)  Neurontin 300 Mg Caps (Gabapentin) .... One by mouth twice daily 11)  Naprosyn 500 Mg Tabs (Naproxen) .... Take 1 tablet by mouth two times a day 12)  Vitamin D3 1000 Unit Caps (Cholecalciferol) .... 2 once daily 13)  Benzonatate 200 Mg Caps (Benzonatate) .... One by mouth q 6 hour as needed cough 14)  Benicar 40 Mg Tabs (Olmesartan medoxomil) .Marland Kitchen.. 1 once daily 15)  Oxycodone Hcl 15 Mg Tabs (Oxycodone hcl) .... One by mouth qid  Hypertension Assessment/Plan:      The patient's hypertensive risk group is category C: Target organ damage and/or diabetes.  His calculated 10 year risk of coronary heart disease is 27 %.  Today's blood pressure is 122/64.  His blood pressure goal is < 130/80.  Patient Instructions: 1)  DO NOT TAKE THE LOSARTIN  throw it away to avoid confusion hold all blood pressure  medicines tomorrow both the amlodipine and the benicar 2)  resume them on  saturday 3)  ONLY take the benicar and the amlodipine for blood pressure 4)  Please schedule a follow-up appointment in 1 month.

## 2010-11-11 NOTE — Assessment & Plan Note (Signed)
Summary: 30 min ov/per dr Psalms Olarte/cjr   Vital Signs:  Patient profile:   70 year old male Height:      72 inches Weight:      222 pounds BMI:     30.22 Temp:     98.2 degrees F oral Pulse rate:   75 / minute Resp:     14 per minute BP sitting:   136 / 70  (left arm)  Vitals Entered By: Allyne Gee, LPN (April  4, 624THL [id number] AM) CC: annual visit for disease management-- benicar 40 added- doesnt know what it took the place of, Hypertension Management   Primary Care Provider:  Dr. Benay Pillow  CC:  annual visit for disease management-- benicar 40 added- doesnt know what it took the place of and Hypertension Management.  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        Positive major cardiovascular risk factors include male age 70 years old or older, diabetes, hyperlipidemia, and hypertension.  Negative major cardiovascular risk factors include non-tobacco-user status.     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  (ICD-715.36) 8)  Low Back Pain  (ICD-724.2) 9)  Benign Prostatic Hypertrophy  (ICD-600.00) 10)  Back Pain, Chronic  (ICD-724.5) 11)  Depression  (ICD-311) 12)  Diabetes Mellitus, Type II  (ICD-250.00) 13)  Family History of Melanoma  (ICD-V16.8) 14)  Family History Diabetes 1st Degree Relative  (ICD-V18.0) 15)  Dm  (ICD-250.00) 16)  Degenerative Joint Disease  (ICD-715.90) 17)  Hypertension  (ICD-401.9) 18)  Hyperlipidemia  (ICD-272.4)  Current Problems (verified): 1)  COPD Unspecified  (ICD-496) 2)  Cough  (ICD-786.2) 3)  Bronchopneumonia Organism Unspecified  (ICD-485) 4)  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  (ICD-715.36) 5)  Dyspnea On Exertion  (ICD-786.09) 6)  Uns Advrs Eff Uns Rx Medicinal&biological Sbstnc  (ICD-995.20) 7)  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 Take 1 Tablet By Mouth Once A Day 2)  Glucovance 2.5-500 Mg  Tabs (Glyburide-Metformin) .... Take 1 Tablet By Mouth Two Times A Day 3)  Acyclovir 800 Mg  Tabs (Acyclovir) .... Take 1 Tablet By Mouth Once A Day 4)  Amitriptyline Hcl 25 Mg  Tabs (Amitriptyline Hcl) .... One By Mouth Q Hs 5)  Prilosec 20 Mg Cpdr (Omeprazole) .... Take  One 30-60 Min Before First Meal of The Day 6)  Prozac 20 Mg Caps (Fluoxetine Hcl) .... One By Mouth Q Am 7)  Norvasc 5 Mg  Tabs (Amlodipine Besylate) .... Once Daily 8)  Finasteride 5 Mg Tabs  (Finasteride) .Marland Kitchen.. 1 Once Daily 9)  Flomax 0.4 Mg  Cp24 (Tamsulosin Hcl) .... One By Mouth Daily 10)  Neurontin 300 Mg  Caps (Gabapentin) .... One By Mouth Twice Daily 11)  Endocet 10-325 Mg Tabs (Oxycodone-Acetaminophen) .... One By Mouth Q 4 Hour As Needed Pain 12)  Vicodin Hp 10-660 Mg Tabs (Hydrocodone-Acetaminophen) .... One By Mouth Q 6 Hours As Needed  For Breakthrough Pain 13)  Naprosyn 500 Mg  Tabs (Naproxen) .... Take 1 Tablet By Mouth Two Times A Day 14)  Vitamin D3 1000 Unit Caps (Cholecalciferol) .... 2 Once Daily 15)  Benzonatate 200 Mg Caps (Benzonatate) .... One By Mouth Q 6 Hour As Needed Cough 16)  Percocet 7.5-325 Mg Tabs (Oxycodone-Acetaminophen) .... One By Mouth Q 6 Hour As Needed Pain 17)  Losartan Potassium 100 Mg Tabs (Losartan Potassium) .Marland Kitchen.. 1 Once Daily  Current Medications (verified): 1)  Aspirin 81 Mg  Tabs (Aspirin) .... Take 1 Tablet By Mouth Once A Day 2)  Glucovance 2.5-500 Mg  Tabs (Glyburide-Metformin) .... Take 1 Tablet By Mouth Two Times A Day 3)  Acyclovir 800 Mg  Tabs (Acyclovir) .... Take 1 Tablet By Mouth Once A Day 4)  Amitriptyline Hcl 25 Mg  Tabs (Amitriptyline Hcl) .... One By Mouth Q Hs 5)  Prilosec 20 Mg Cpdr (Omeprazole) .... Take  One 30-60 Min Before First Meal of The Day 6)  Prozac 20 Mg Caps (Fluoxetine Hcl) .... One By Mouth Q Am 7)  Norvasc 5 Mg  Tabs (Amlodipine Besylate) .... Once Daily 8)  Finasteride 5 Mg Tabs (Finasteride) .Marland Kitchen.. 1 Once Daily 9)  Flomax 0.4 Mg  Cp24 (Tamsulosin Hcl) .... One By Mouth Daily 10)  Neurontin 300 Mg  Caps (Gabapentin) .... One By Mouth Twice Daily 11)  Endocet 10-325 Mg Tabs (Oxycodone-Acetaminophen) .... One By Mouth Q 4 Hour As Needed Pain 12)  Vicodin Hp 10-660 Mg Tabs (Hydrocodone-Acetaminophen) .... One By Mouth Q 6 Hours As Needed  For Breakthrough Pain 13)  Naprosyn 500 Mg  Tabs (Naproxen) .... Take 1 Tablet By Mouth Two Times A Day 14)  Vitamin D3 1000 Unit Caps (Cholecalciferol) .... 2 Once  Daily 15)  Benzonatate 200 Mg Caps (Benzonatate) .... One By Mouth Q 6 Hour As Needed Cough 16)  Percocet 7.5-325 Mg Tabs (Oxycodone-Acetaminophen) .... One By Mouth Q 6 Hour As Needed Pain 17)  Losartan Potassium 100 Mg Tabs (Losartan Potassium) .Marland Kitchen.. 1 Once Daily 18)  Benicar 40 Mg Tabs (Olmesartan Medoxomil) .Marland Kitchen.. 1 Once Daily  Allergies (verified): No Known Drug Allergies  Past History:  Family History: Last updated: 08/12/2009 Family History Diabetes 1st degree relative Family History of Melanoma Family History of Stroke M 1st degree relative <50 Fam hx fibromyalgia Negative for respiratory diseases or atopy   Social History: Last updated: 08/12/2009 Married  Children Retired from [employer] work.  Previous exposure to chemicals at his job. Former Smoker.  Quit in 1985.  Smoked for 15-20 yrs up to 3-45 ppd. Alcohol use-no Drug use-no  Risk Factors: Exercise: no (06/20/2007)  Risk Factors: Smoking Status: quit (01/11/2010) Packs/Day: 4.0 (01/11/2010)  Past medical, surgical, family and social histories (including risk factors) reviewed, and no changes noted (except as noted below).  Past Medical History: Reviewed history from 12/18/2009 and no changes required. Hyperlipidemia Hypertension    - Try off ACE August 12, 2009 for upper airway cough Cough...................................................................................Marland KitchenWert     - Sinus ct December 18, 2009 :  Minor mucosal thickening at the right frontoethmoidal recess, otherwise negative sinuses. COPD     - PFT's December 15, 2009 FEV1 2.30 (70%) ratio 63 no better with B2 and DLC0 18/6 (73%) corrects to 106% Diabetes mellitus, type II degenerative joint disease Depression Benign prostatic hypertrophy Low back pain  Past Surgical History: Reviewed history from 04/06/2007 and no changes required. [id number]  Family History: Reviewed history from 08/12/2009 and no changes required. Family History  Diabetes 1st degree relative Family History of Melanoma Family History of Stroke M 1st degree relative <50 Fam hx fibromyalgia Negative for respiratory diseases or atopy   Social History: Reviewed history from 08/12/2009 and no changes required. Married Children Retired from [employer] work.  Previous exposure to chemicals at his job. Former Smoker.  Quit in 1985.  Smoked for 15-20 yrs up to 3-45 ppd. Alcohol use-no Drug use-no  Review of Systems       The patient complains of prolonged cough.  The patient denies anorexia, fever, weight loss, weight gain, vision loss, decreased hearing, hoarseness, chest pain, syncope, dyspnea on exertion, peripheral edema, headaches, hemoptysis, abdominal pain, melena, hematochezia, severe indigestion/heartburn, hematuria, incontinence, genital sores, muscle weakness, suspicious skin lesions, transient blindness, difficulty walking, depression, unusual weight change, abnormal bleeding, enlarged lymph nodes, angioedema, breast masses, and testicular masses.    Physical Exam  General:  Well-developed,well-nourished,in no acute distress; alert,appropriate and cooperative throughout examination Head:  normocephalic, atraumatic, and no abnormalities observed.   Eyes:  pupils equal and pupils round.   Ears:  R ear normal and L ear normal.   Nose:  no external deformity and no nasal discharge.   Mouth:  pharynx pink and moist and no erythema.   Neck:  supple and full ROM.   Lungs:  normal respiratory effort.   increased inspiratory toe exp ratio no wheezing and egophany has cleared Heart:  normal rate and regular rhythm.   Abdomen:  soft, non-tender, and normal bowel sounds.  distende  and tense Genitalia:  circumcised and no urethral discharge.   Prostate:  no gland enlargement and no asymmetry.   Msk:  normal ROM and no joint tenderness.   Extremities:  trace left pedal edema and trace right pedal edema.   Neurologic:  alert & oriented X3 and  finger-to-nose normal.     Impression & Recommendations:  Problem # 1:  PREVENTIVE HEALTH CARE (ICD-V70.0) The pt was asked about all immunizations, health maint. services that are appropriate to their age and was given guidance on diet exercize  and weight management  Colonoscopy: normal (05/18/2007) Td Booster: Historical (06/10/2006)   Flu Vax: Fluvax 3+ (08/31/2009)   Pneumovax: Pneumovax (Medicare) (08/31/2009) Chol: 182 (01/04/2010)   HDL: 35.40 (01/04/2010)   LDL: 135 (01/28/2008)   TG: 241.0 (01/04/2010) TSH: 4.18 (01/04/2010)   HgbA1C: 6.0 (01/04/2010)   PSA: 2.15 (01/04/2010) Next  Colonoscopy due:: 05/2017 (11/02/2009)  Discussed using sunscreen, use of alcohol, drug use, self testicular exam, routine dental care, routine eye care, routine physical exam, seat belts, multiple vitamins, osteoporosis prevention, adequate calcium intake in diet, and recommendations for immunizations.  Discussed exercise and checking cholesterol.  Discussed gun safety, safe sex, and contraception. Also recommend checking PSA.  Problem # 2:  COPD UNSPECIFIED (ICD-496) golds stage 2 with weight loss and monitering with pallnt to add a long acting agent if weigth loss not achieved  Problem # 3:  DIABETES MELLITUS, TYPE II (ICD-250.00)  His updated medication list for this problem includes:    Aspirin 81 Mg Tabs (Aspirin) .Marland Kitchen... Take 1 tablet by mouth once a day    Glucovance 2.5-500 Mg Tabs (Glyburide-metformin) .Marland Kitchen... Take 1 tablet by mouth two times a day    Losartan Potassium 100 Mg Tabs (Losartan potassium) .Marland Kitchen... 1 once daily    Benicar 40 Mg Tabs (Olmesartan medoxomil) .Marland Kitchen... 1 once daily  Labs Reviewed: Creat: 0.8 (01/04/2010)     Last Eye Exam: see office note from opthamolgist for details (09/26/2007) Reviewed HgBA1c results: 6.0 (01/04/2010)  6.6 (01/22/2009)  Problem # 4:  BACK PAIN, CHRONIC (ICD-724.5) change of medications The following medications were removed from the medication  list:    Endocet 10-325 Mg Tabs (Oxycodone-acetaminophen) ..... One by mouth q 4 hour as needed pain    Vicodin Hp 10-660 Mg Tabs (Hydrocodone-acetaminophen) ..... One by mouth q 6 hours as needed  for breakthrough pain    Percocet 7.5-325 Mg Tabs (Oxycodone-acetaminophen) ..... One by mouth q 6 hour as needed pain His updated medication list for this problem includes:    Aspirin 81 Mg Tabs (Aspirin) .Marland Kitchen... Take 1 tablet by mouth once a day    Naprosyn 500 Mg Tabs (Naproxen) .Marland Kitchen... Take 1 tablet by mouth two times a day    Oxycontin 20 Mg Xr12h-tab (Oxycodone hcl) ..... One by mouth bid  Discussed use of moist heat or ice, modified activities, medications, and stretching/strengthening exercises. Back care instructions given. To be seen in 2 weeks if no improvement; sooner if worsening of symptoms.   Complete Medication List: 1)  Aspirin 81 Mg Tabs (Aspirin) .... Take 1 tablet by mouth once a day 2)  Glucovance 2.5-500 Mg Tabs (Glyburide-metformin) .... Take 1 tablet by mouth two times a day 3)  Acyclovir 800 Mg Tabs (Acyclovir) .... Take 1 tablet by mouth once a day 4)  Amitriptyline Hcl 25 Mg Tabs (Amitriptyline hcl) .... One by mouth q hs 5)  Prilosec 20 Mg Cpdr (Omeprazole) .... Take  one 30-60 min before first meal of the day 6)  Prozac 20 Mg Caps (Fluoxetine hcl) .... One by mouth q am 7)  Norvasc 5 Mg Tabs (Amlodipine besylate) .... Once daily 8)  Finasteride 5 Mg Tabs (Finasteride) .Marland Kitchen.. 1 once daily 9)  Flomax 0.4 Mg Cp24 (Tamsulosin hcl) .... One by mouth daily 10)  Neurontin 300 Mg Caps (Gabapentin) .... One by mouth twice daily 11)  Naprosyn 500 Mg Tabs (Naproxen) .... Take 1 tablet by mouth two times a day 12)  Vitamin D3 1000 Unit Caps (Cholecalciferol) .... 2 once daily 13)  Benzonatate 200 Mg Caps (Benzonatate) .... One by mouth q 6 hour as needed cough 14)  Losartan Potassium 100 Mg Tabs (Losartan potassium) .Marland Kitchen.. 1 once daily 15)  Benicar 40 Mg Tabs (Olmesartan medoxomil) .Marland Kitchen..  1 once daily 16)  Oxycontin 20 Mg Xr12h-tab (Oxycodone hcl) .... One by mouth  bid  Other Orders: EKG w/ Interpretation (93000)  Hypertension Assessment/Plan:      The patient's hypertensive risk group is category C: Target organ damage and/or diabetes.  His calculated 10 year risk of coronary heart disease is 33 %.  Today's blood pressure is 136/70.  His blood pressure goal is < 130/80.  Patient Instructions: 1)  10 pounds weigth loss goal 2)  Please schedule a follow-up appointment in 1 month. for monitering of the oxycontin Prescriptions: OXYCONTIN 20 MG XR12H-TAB (OXYCODONE HCL) one by mouth BID  #60 x 0   Entered and Authorized by:   Ricard Dillon MD   Signed by:   Ricard Dillon MD on 01/11/2010   Method used:   Print then Give to Patient   RxID:   [id number]

## 2010-11-11 NOTE — Progress Notes (Signed)
Summary: benicar 20mg     [id number]  Phone Note Call from Patient Call back at Home Phone [phone]   Caller: Patient Call For: Brookelle Pellicane Summary of Call: needs refill on benicar we have no samples pts appt had to be pushed out due to the weather [facility] Initial call taken by: Cooper Render,  October 27, 2009 12:43 PM  Follow-up for Phone Call        [facility].  Pt saw MW 08-12-2009 and was changed from Benazapril to Benicar 20mg .  Pt did have a pending appt scheduled for 10-20-2009 but d/t the weather appt was cancelled and rescheduled to feb.  pt states he will run out of benicar samples before appt.  no samples of benicar avail in the office.  HOWEVER, after reviewing pt's pending appts...Marland KitchenMarland Kitchen Pt has been accidently scheduled to see CY instead of MW.  Therefore pt needs to be rescheduled and put on MW's schedule.  Jinny Blossom Reynolds LPN  January 18, 624THL 2:07 PM   Additional Follow-up for Phone Call Additional follow up Details #1::        pt r/s with MW. Pt has enough samples to last him until then. [place] Bing CMA  October 27, 2009 2:36 PM     Prescriptions: BENICAR 20 MG  TABS (OLMESARTAN MEDOXOMIL) One tablet by mouth daily  #30 x 0   Entered by:   [place] Bing CMA   Authorized by:   Tanda Rockers MD   Signed by:   [place] Bing CMA on 10/27/2009   Method used:   Electronically to        [facility]  [phone]* (retail)       [address]       [place], [place]  [address]       Ph: [phone] or [phone]       Fax: [phone]   RxID:   [id number]

## 2010-11-11 NOTE — Progress Notes (Signed)
Summary: new refill rx  Phone Note Refill Request Message from:  Patient---walk in  Refills Requested: Medication #1:  PERCOCET 5-325 MG TABS 1 two times a day. requesting new rx 3-4- times per day. pt will wait in lobby.per drjenkinss- may have three times a day only  Initial call taken by: Despina Arias,  June 28, 2010 10:24 AM    New/Updated Medications: PERCOCET 5-325 MG TABS (OXYCODONE-ACETAMINOPHEN) 1 three times a day Prescriptions: PERCOCET 5-325 MG TABS (OXYCODONE-ACETAMINOPHEN) 1 three times a day  #90 x 0   Entered by:   Allyne Gee, LPN   Authorized by:   Ricard Dillon MD   Signed by:   Allyne Gee, LPN on [id number]   Method used:   Print then Give to Patient   RxID:   [id number]

## 2010-11-11 NOTE — Assessment & Plan Note (Signed)
Summary: 2 month rov/njr   Vital Signs:  Patient profile:   70 year old male Height:      72 inches Weight:      222 pounds BMI:     30.22 Temp:     98.2 degrees F oral Pulse rate:   80 / minute Resp:     14 per minute BP sitting:   140 / 78  (left arm)  Vitals Entered By: Allyne Gee, LPN (January 24, [id number] 9:36 AM) CC: roa- dr wert changed benazepril to benicar 20, Hypertension Management   Primary Care Provider:  Dr. Benay Pillow  CC:  roa- dr wert changed benazepril to benicar 20 and Hypertension Management.  History of Present Illness: blood pressure  change to benicar 20 with blood pressure 140/90 the cough is improved but still "a little there" has surgery scheduled for Feb 17 Back pain and knee pain is much improved weight loss had helped still has significant pain in the hips the pt is on darvocet and needs a substitue   Hypertension History:      He complains of dyspnea with exertion, but denies headache, chest pain, palpitations, orthopnea, PND, peripheral edema, visual symptoms, neurologic problems, syncope, and side effects from treatment.        Positive major cardiovascular risk factors include male age 71 years old or older, diabetes, hyperlipidemia, and hypertension.  Negative major cardiovascular risk factors include non-tobacco-user status.     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 One Tablet By Mouth Daily 2)  Aspirin 81 Mg  Tabs (Aspirin) .... Take 1 Tablet By Mouth Once A Day 3)  Glucovance 2.5-500 Mg  Tabs (Glyburide-Metformin) .... Take 1 Tablet By Mouth Two Times A Day 4)  Acyclovir 800 Mg  Tabs (Acyclovir) .... Take 1 Tablet By Mouth Once A Day 5)  Amitriptyline Hcl 25 Mg  Tabs (Amitriptyline Hcl) .... One By Mouth Q Hs 6)  Prilosec 20 Mg Cpdr (Omeprazole) .... Take One 30-60 Min Before First and Last Meals of The Day 7)  Prozac 20 Mg Caps (Fluoxetine Hcl) .... One By Mouth Q Am 8)  Norvasc 5 Mg  Tabs (Amlodipine Besylate) .... Once Daily 9)  Finasteride 5 Mg Tabs (Finasteride) .Marland Kitchen.. 1 Once Daily 10)  Flomax 0.4 Mg  Cp24 (Tamsulosin Hcl) .... One By Mouth Daily 11)  Neurontin 300 Mg  Caps (Gabapentin) .... One By Mouth Twice Daily 12)  Furosemide 20 Mg Tabs (Furosemide) .... One By Mouth  Q Am 13)  Endocet 10-325 Mg Tabs (Oxycodone-Acetaminophen) .... One By Mouth Q 4 Hour As Needed Pain 14)  Vicodin Hp 10-660 Mg Tabs (Hydrocodone-Acetaminophen) .... One By Mouth Q 6 Hours As Needed  For Breakthrough Pain 15)  Naprosyn 500 Mg  Tabs (Naproxen) .... Take 1 Tablet By Mouth Two Times A Day 16)  Vitamin D3 1000 Unit Caps (Cholecalciferol) .... 2 Once Daily 17)  Benzonatate 200 Mg Caps (Benzonatate) .... One By Mouth Q 6 Hour As Needed Cough 18)  Percocet 7.5-325 Mg Tabs (Oxycodone-Acetaminophen) .... One By Mouth Q 6 Hour As Needed Pain  Current Medications (verified): 1)  Benicar 20 Mg  Tabs (Olmesartan Medoxomil) .... One Tablet By Mouth Daily 2)  Aspirin 81 Mg  Tabs (Aspirin)  .... Take 1 Tablet By Mouth Once A Day 3)  Glucovance 2.5-500 Mg  Tabs (Glyburide-Metformin) .... Take 1 Tablet By Mouth Two Times A Day 4)  Acyclovir 800 Mg  Tabs (Acyclovir) .... Take 1 Tablet By Mouth Once A Day 5)  Amitriptyline Hcl 25 Mg  Tabs (Amitriptyline Hcl) .... One By Mouth Q Hs 6)  Prilosec 20 Mg Cpdr (Omeprazole) .... Take One 30-60 Min Before First and Last Meals of The Day 7)  Prozac 20 Mg Caps (Fluoxetine Hcl) .... One By Mouth Q Am 8)  Norvasc 5 Mg  Tabs (Amlodipine Besylate) .... Once Daily 9)  Finasteride 5 Mg Tabs (Finasteride) .Marland Kitchen.. 1 Once Daily 10)  Flomax 0.4 Mg  Cp24 (Tamsulosin Hcl) .... One By Mouth Daily 11)  Neurontin 300 Mg  Caps (Gabapentin) .... One By Mouth Twice Daily 12)  Furosemide 20 Mg Tabs (Furosemide) .... One By Mouth Q Am 13)  Endocet 10-325 Mg Tabs (Oxycodone-Acetaminophen) .... One By Mouth Q 4 Hour As Needed Pain 14)  Vicodin Hp 10-660 Mg Tabs (Hydrocodone-Acetaminophen) .... One By Mouth Q 6 Hours As Needed  For Breakthrough Pain 15)  Naprosyn 500 Mg  Tabs (Naproxen) .... Take 1 Tablet By Mouth Two Times A Day 16)  Vitamin D3 1000 Unit Caps (Cholecalciferol) .... 2 Once Daily 17)  Benzonatate 200 Mg Caps (Benzonatate) .... One By Mouth Q 6 Hour As Needed Cough 18)  Percocet 7.5-325 Mg Tabs (Oxycodone-Acetaminophen) .... One By Mouth Q 6 Hour As Needed Pain  Allergies (verified): No Known Drug Allergies  Past History:  Family History: Last updated: 08/12/2009 Family History Diabetes 1st degree relative Family History of Melanoma Family History of Stroke M 1st degree relative <50 Fam hx fibromyalgia Negative for respiratory diseases or atopy   Social History: Last updated: 08/12/2009 Married Children Retired from [employer] work.  Previous exposure to chemicals at his job. Former Smoker.  Quit in 1985.  Smoked for 15-20 yrs up to 3-45 ppd. Alcohol use-no Drug use-no  Risk Factors: Exercise: no (06/20/2007)  Risk Factors: Smoking  Status: quit (11/02/2009) Packs/Day: 4.0 (11/02/2009)  Past medical, surgical, family and social histories (including risk factors) reviewed, and no changes noted (except as noted below).  Past Medical History: Reviewed history from 09/21/2009 and no changes required. Hyperlipidemia Hypertension    - Try off ACE August 12, 2009 for upper airway cough Cough.................................................................................Marland KitchenWert Diabetes mellitus, type II degenerative joint disease Depression Benign prostatic hypertrophy Low back pain  Past Surgical History: Reviewed history from 04/06/2007 and no changes required. [id number]  Family History: Reviewed history from 08/12/2009 and no changes required. Family History Diabetes 1st degree relative Family History of Melanoma Family History of Stroke M 1st degree relative <50  Fam hx fibromyalgia Negative for respiratory diseases or atopy   Social History: Reviewed history from 08/12/2009 and no changes required. Married Children Retired from [employer] work.  Previous exposure to chemicals at his job. Former Smoker.  Quit in 1985.  Smoked for 15-20 yrs up to 3-45 ppd. Alcohol use-no Drug use-no  Review of Systems  The patient denies anorexia, fever, weight loss, weight gain, vision loss, decreased hearing, hoarseness, chest pain, syncope, dyspnea on exertion, peripheral edema, prolonged cough, headaches, hemoptysis, abdominal pain, melena, hematochezia, severe indigestion/heartburn, hematuria, incontinence, genital sores, muscle weakness, suspicious skin lesions, transient blindness, difficulty walking, depression, unusual weight change, abnormal bleeding, enlarged lymph nodes, angioedema, and breast masses.    Physical Exam  General:  Well-developed,well-nourished,in no acute distress; alert,appropriate and cooperative throughout examination Head:  Normocephalic and atraumatic without obvious abnormalities. No  apparent alopecia or balding. Eyes:  pupils equal and pupils round.   Ears:  R ear normal and L ear normal.   Nose:  no external deformity and no nasal discharge.   Mouth:  Oral mucosa and oropharynx without lesions or exudates.  Teeth in good repair. Neck:  supple and full ROM.   Lungs:  normal respiratory effort.   increased inspiratory toe exp ratio no wheezing and egophany has cleared Heart:  normal rate and regular rhythm.   Abdomen:  soft, non-tender, and normal bowel sounds.  distende  and tense Msk:  normal ROM and no joint tenderness.   Pulses:  decreased Extremities:  trace left pedal edema and trace right pedal edema.   Neurologic:  alert & oriented X3 and finger-to-nose normal.     Impression & Recommendations:  Problem # 1:  COUGH (ICD-786.2) due to ace? improved with change to arb  Problem # 2:  DIABETES MELLITUS, TYPE II (ICD-250.00)  The following medications were removed from the medication list:    Benicar 20 Mg Tabs (Olmesartan medoxomil) ..... One tablet by mouth daily His updated medication list for this problem includes:    Aspirin 81 Mg Tabs (Aspirin) .Marland Kitchen... Take 1 tablet by mouth once a day    Glucovance 2.5-500 Mg Tabs (Glyburide-metformin) .Marland Kitchen... Take 1 tablet by mouth two times a day    Benicar 40 Mg Tabs (Olmesartan medoxomil) ..... One by mouth daily  Labs Reviewed: Creat: 0.8 (07/30/2009)     Last Eye Exam: see office note from opthamolgist for details (09/26/2007) Reviewed HgBA1c results: 6.6 (01/22/2009)  6.7 (10/01/2008)  Problem # 3:  HYPERTENSION (ICD-401.9) Assessment: Deteriorated when returns will use azor The following medications were removed from the medication list:    Benicar 20 Mg Tabs (Olmesartan medoxomil) ..... One tablet by mouth daily    Furosemide 20 Mg Tabs (Furosemide) ..... One by mouth q am His updated medication list for this problem includes:    Norvasc 5 Mg Tabs (Amlodipine besylate) ..... Once daily    Benicar 40 Mg  Tabs (Olmesartan medoxomil) ..... One by mouth daily  BP today: 140/78  not at goal Prior BP: 108/60 (09/21/2009)  10 Yr Risk Heart Disease: 47 % Prior 10 Yr Risk Heart Disease: 40 % (08/31/2009)  Labs Reviewed: K+: 4.1 (07/30/2009) Creat: : 0.8 (07/30/2009)   Chol: 183 (01/22/2009)   HDL: 33.00 (01/22/2009)   LDL: 135 (01/28/2008)   TG: 148 (01/28/2008)  Problem # 4:  HYPERLIPIDEMIA (ICD-272.4) Assessment: Unchanged  due pblood work  Labs Reviewed: SGOT: 26 (10/01/2008)   SGPT: 18 (10/01/2008)  Lipid Goals: Chol Goal: 200 (12/03/2007)   HDL  Goal: 40 (12/03/2007)   LDL Goal: 100 (12/03/2007)   TG Goal: 150 (12/03/2007)  10 Yr Risk Heart Disease: 47 % Prior 10 Yr Risk Heart Disease: 40 % (08/31/2009)   HDL:33.00 (01/22/2009), 28.7 (01/28/2008)  LDL:135 (01/28/2008), 54 (12/27/2006)  Chol:183 (01/22/2009), 193 (01/28/2008)  Trig:148 (01/28/2008), 155 (12/27/2006)  Complete Medication List: 1)  Aspirin 81 Mg Tabs (Aspirin) .... Take 1 tablet by mouth once a day 2)  Glucovance 2.5-500 Mg Tabs (Glyburide-metformin) .... Take 1 tablet by mouth two times a day 3)  Acyclovir 800 Mg Tabs (Acyclovir) .... Take 1 tablet by mouth once a day 4)  Amitriptyline Hcl 25 Mg Tabs (Amitriptyline hcl) .... One by mouth q hs 5)  Prilosec 20 Mg Cpdr (Omeprazole) .... Take one 30-60 min before first and last meals of the day 6)  Prozac 20 Mg Caps (Fluoxetine hcl) .... One by mouth q am 7)  Norvasc 5 Mg Tabs (Amlodipine besylate) .... Once daily 8)  Finasteride 5 Mg Tabs (Finasteride) .Marland Kitchen.. 1 once daily 9)  Flomax 0.4 Mg Cp24 (Tamsulosin hcl) .... One by mouth daily 10)  Neurontin 300 Mg Caps (Gabapentin) .... One by mouth twice daily 11)  Endocet 10-325 Mg Tabs (Oxycodone-acetaminophen) .... One by mouth q 4 hour as needed pain 12)  Vicodin Hp 10-660 Mg Tabs (Hydrocodone-acetaminophen) .... One by mouth q 6 hours as needed  for breakthrough pain 13)  Naprosyn 500 Mg Tabs (Naproxen) .... Take 1 tablet  by mouth two times a day 14)  Vitamin D3 1000 Unit Caps (Cholecalciferol) .... 2 once daily 15)  Benzonatate 200 Mg Caps (Benzonatate) .... One by mouth q 6 hour as needed cough 16)  Percocet 7.5-325 Mg Tabs (Oxycodone-acetaminophen) .... One by mouth q 6 hour as needed pain 17)  Benicar 40 Mg Tabs (Olmesartan medoxomil) .... One by mouth daily  Hypertension Assessment/Plan:      The patient's hypertensive risk group is category C: Target organ damage and/or diabetes.  His calculated 10 year risk of coronary heart disease is 47 %.  Today's blood pressure is 140/78.  His blood pressure goal is < 130/80.  Patient Instructions: 1)  april  30 min medicare visit 2)  BMP prior to visit, ICD-9:401.90 3)  Hepatic Panel prior to visit, ICD-9:995.20 4)  Lipid Panel prior to visit, ICD-9:272.4 5)  TSH prior to visit, ICD-9:272.4 6)  PSA prior to visit, ICD-9:601.0 7)  HbgA1C prior to visit, ICD-9:250.00   Immunization History:  Tetanus/Td Immunization History:    Tetanus/Td:  historical (06/10/2006)    Preventive Care Screening  Colonoscopy:    Date:  05/18/2007    Next Due:  05/2017    Results:  normal   Last Tetanus Booster:    Date:  06/10/2006    Results:  Historical

## 2010-11-11 NOTE — Assessment & Plan Note (Signed)
Summary: 1 month rov/njr   Vital Signs:  Patient profile:   70 year old male Weight:      212 pounds BMI:     28.86 Pulse rate:   80 / minute Pulse rhythm:   regular BP sitting:   110 / 60  (left arm) Cuff size:   regular  Vitals Entered By: Chipper Oman, RN (April 23, 2010 10:11 AM) CC: 1 mo ROV, still SOB with activity, knees and hips hurt., Hypertension Management   Primary Care Provider:  Dr. Benay Pillow  CC:  1 mo ROV, still SOB with activity, knees and hips hurt., and Hypertension Management.  History of Present Illness: ad rushing in head with standing too rapidly all pain is located in the SI joint area and radiates to the buittock and legs Blood sugers have been in the 100 range expect when he eats sweets!   Hypertension History:      He denies headache, chest pain, palpitations, dyspnea with exertion, orthopnea, PND, peripheral edema, visual symptoms, neurologic problems, syncope, and side effects from treatment.        Positive major cardiovascular risk factors include male age 84 years old or older, diabetes, hyperlipidemia, and hypertension.  Negative major cardiovascular risk factors include non-tobacco-user status.     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 Take 1 Tablet By Mouth Once A Day 2)  Glucovance 2.5-500 Mg  Tabs (Glyburide-Metformin) .... Take 1 Tablet By Mouth Two Times A Day 3)  Acyclovir 800 Mg  Tabs (Acyclovir) .... Take 1 Tablet By Mouth Once A Day 4)  Amitriptyline Hcl 25 Mg  Tabs (Amitriptyline Hcl) .... One By Mouth Q Hs 5)  Prilosec 20 Mg Cpdr (Omeprazole) .... Take  One 30-60 Min Before First Meal of The Day 6)  Prozac 20 Mg Caps (Fluoxetine Hcl) .... One By Mouth Q Am 7)  Norvasc 5 Mg  Tabs (Amlodipine Besylate) .... Once Daily 8)  Finasteride 5 Mg Tabs (Finasteride) .Marland Kitchen.. 1 Once Daily 9)  Flomax 0.4 Mg  Cp24 (Tamsulosin Hcl) .... One By Mouth Daily 10)  Neurontin 300 Mg  Caps (Gabapentin) .... One By Mouth Twice Daily 11)  Naprosyn 500 Mg  Tabs (Naproxen) .... Take 1 Tablet By Mouth Two Times A Day 12)  Vitamin D3 1000 Unit Caps (Cholecalciferol) .... 2 Once Daily 13)  Benzonatate 200 Mg Caps (Benzonatate) .... One By Mouth Q 6 Hour As Needed Cough 14)  Benicar 40 Mg Tabs (Olmesartan Medoxomil) .Marland Kitchen.. 1 Once Daily 15)  Oxycodone Hcl 15 Mg Tabs (Oxycodone Hcl) .... One  By Mouth Qid  Current Medications (verified): 1)  Aspirin 81 Mg  Tabs (Aspirin) .... Take 1 Tablet By Mouth Once A Day 2)  Glucovance 2.5-500 Mg  Tabs (Glyburide-Metformin) .... Take 1 Tablet By Mouth Two Times A Day 3)  Acyclovir 800 Mg  Tabs (Acyclovir) .... Take 1 Tablet By Mouth Once A Day 4)  Amitriptyline Hcl 25 Mg  Tabs (Amitriptyline Hcl) .... One By Mouth Q Hs 5)  Prilosec 20 Mg Cpdr (Omeprazole) .... Take  One 30-60 Min Before First Meal of The Day 6)  Prozac 20 Mg Caps (Fluoxetine Hcl) .... One By Mouth Q Am 7)  Norvasc 5 Mg  Tabs (Amlodipine Besylate) .... Once Daily 8)  Finasteride 5 Mg Tabs (Finasteride) .Marland Kitchen.. 1 Once Daily 9)  Flomax  0.4 Mg  Cp24 (Tamsulosin Hcl) .... One By Mouth Daily 10)  Neurontin 300 Mg  Caps (Gabapentin) .... One By Mouth Twice Daily 11)  Naprosyn 500 Mg  Tabs (Naproxen) .... Take 1 Tablet By Mouth Two Times A Day 12)  Vitamin D3 1000 Unit Caps (Cholecalciferol) .... 2 Once Daily 13)  Benzonatate 200 Mg Caps (Benzonatate) .... One By Mouth Q 6 Hour As Needed Cough 14)  Benicar 40 Mg Tabs (Olmesartan Medoxomil) .Marland Kitchen.. 1 Once Daily 15)  Oxycodone Hcl 15 Mg Tabs (Oxycodone Hcl) .... One By Mouth Qid  Allergies: No Known Drug Allergies  Past History:  Family History: Last updated: 08/12/2009 Family History Diabetes 1st degree relative Family History of Melanoma Family History of Stroke M 1st degree relative <50 Fam hx fibromyalgia Negative for respiratory diseases or atopy   Social History: Last updated: 08/12/2009 Married Children Retired from [employer] work.  Previous exposure to chemicals at his job. Former Smoker.  Quit in 1985.  Smoked for 15-20 yrs up to 3-45 ppd. Alcohol use-no Drug use-no  Risk Factors: Exercise: no (06/20/2007)  Risk Factors: Smoking Status: quit (03/18/2010) Packs/Day: 4.0 (03/18/2010)  Past medical, surgical, family and social histories (including risk factors) reviewed, and no changes noted (except as noted below).  Past Medical History: Reviewed history from 12/18/2009 and no changes required. Hyperlipidemia Hypertension    - Try off ACE August 12, 2009 for upper airway cough Cough...................................................................................Marland KitchenWert     - Sinus ct December 18, 2009 :  Minor mucosal thickening at the right frontoethmoidal recess, otherwise negative sinuses. COPD     - PFT's December 15, 2009 FEV1 2.30 (70%) ratio 63 no better with B2 and DLC0 18/6 (73%) corrects to 106% Diabetes mellitus, type II degenerative joint disease Depression Benign prostatic hypertrophy Low back pain  Past Surgical History: Reviewed  history from 04/06/2007 and no changes required. [id number]  Family History: Reviewed history from 08/12/2009 and no changes required. Family History Diabetes 1st degree relative Family History of Melanoma Family History of Stroke M 1st degree relative <50 Fam hx fibromyalgia Negative for respiratory diseases or atopy   Social History: Reviewed history from 08/12/2009 and no changes required. Married Children Retired from [employer] work.  Previous exposure to chemicals at his job. Former Smoker.  Quit in 1985.  Smoked for 15-20 yrs up to 3-45 ppd. Alcohol use-no Drug use-no  Review of Systems  The patient denies anorexia, fever, weight loss, weight gain, vision loss, decreased hearing, hoarseness, chest pain, syncope, dyspnea on exertion, peripheral edema, prolonged cough, headaches, hemoptysis, abdominal pain, melena, hematochezia, severe indigestion/heartburn, hematuria, incontinence, genital sores, muscle weakness, suspicious skin lesions, transient blindness, difficulty walking, depression, unusual weight change, abnormal bleeding,  enlarged lymph nodes, angioedema, and breast masses.    Physical Exam  General:  alert and well-hydrated.   Head:  normocephalic and atraumatic.   Eyes:  pupils equal and pupils round.   Ears:  R ear normal and L ear normal.   Nose:  no external deformity and no nasal discharge.   Mouth:  pharynx pink and moist and no erythema.   Neck:  supple and full ROM.   Lungs:  normal respiratory effort.   increased inspiratory toe exp ratio no wheezing and egophany has cleared Heart:  normal rate and regular rhythm.   Abdomen:  soft, non-tender, and normal bowel sounds.  distend  and tense Genitalia:  circumcised and no urethral discharge.   Msk:  normal ROM and no joint tenderness.   Pulses:  decreased Extremities:  trace left pedal edema and trace right pedal edema.   Neurologic:  alert & oriented X3 and finger-to-nose normal.     Impression &  Recommendations:  Problem # 1:  LOW BACK PAIN (ICD-724.2) the pt has symptoms of spinal stenosis and spondylosis His updated medication list for this problem includes:    Aspirin 81 Mg Tabs (Aspirin) .Marland Kitchen... Take 1 tablet by mouth once a day    Naprosyn 500 Mg Tabs (Naproxen) .Marland Kitchen... Take 1 tablet by mouth two times a day    Oxycodone Hcl 15 Mg Tabs (Oxycodone hcl) ..... One by mouth qid  Discussed use of moist heat or ice, modified activities, medications, and stretching/strengthening exercises. Back care instructions given. To be seen in 2 weeks if no improvement; sooner if worsening of symptoms.   Problem # 2:  DIABETES MELLITUS, TYPE II (ICD-250.00) Assessment: Improved  His updated medication list for this problem includes:    Aspirin 81 Mg Tabs (Aspirin) .Marland Kitchen... Take 1 tablet by mouth once a day    Glucovance 2.5-500 Mg Tabs (Glyburide-metformin) .Marland Kitchen... Take 1 tablet by mouth two times a day    Benicar 40 Mg Tabs (Olmesartan medoxomil) .Marland Kitchen... 1 once daily  Labs Reviewed: Creat: 0.8 (01/04/2010)     Last Eye Exam: see office note from opthamolgist for details (09/26/2007) Reviewed HgBA1c results: 6.0 (01/04/2010)  6.6 (01/22/2009)  Orders: Venipuncture [id number]) TLB-BMP (Basic Metabolic Panel-BMET) (99991111) TLB-A1C / Hgb A1C (Glycohemoglobin) (83036-A1C)  Problem # 3:  LOC OSTEOARTHROS NOT SPEC PRIM/SEC LOWER LEG (ICD-715.36)  His updated medication list for this problem includes:    Aspirin 81 Mg Tabs (Aspirin) .Marland Kitchen... Take 1 tablet by mouth once a day    Naprosyn 500 Mg Tabs (Naproxen) .Marland Kitchen... Take 1 tablet by mouth two times a day    Oxycodone Hcl 15 Mg Tabs (Oxycodone hcl) ..... One by mouth qid  Discussed use of medications, application of heat or cold, and exercises.   Problem # 4:  DIABETES MELLITUS, TYPE II (ICD-250.00)  His updated medication list for this problem includes:    Aspirin 81 Mg Tabs (Aspirin) .Marland Kitchen... Take 1 tablet by mouth once a day    Glucovance 2.5-500  Mg Tabs (Glyburide-metformin) .Marland Kitchen... Take 1 tablet by mouth two times a day    Benicar 40 Mg Tabs (Olmesartan medoxomil) .Marland Kitchen... 1 once daily  Labs Reviewed: Creat: 0.8 (01/04/2010)     Last Eye Exam: see office note from opthamolgist for details (09/26/2007) Reviewed HgBA1c results: 6.0 (01/04/2010)  6.6 (01/22/2009)  Orders: Venipuncture [id number]) TLB-BMP (Basic Metabolic Panel-BMET) (99991111) TLB-A1C / Hgb A1C (Glycohemoglobin) (83036-A1C)  Complete Medication List: 1)  Aspirin 81 Mg Tabs (Aspirin) .Marland KitchenMarland KitchenMarland Kitchen  Take 1 tablet by mouth once a day 2)  Glucovance 2.5-500 Mg Tabs (Glyburide-metformin) .... Take 1 tablet by mouth two times a day 3)  Amitriptyline Hcl 25 Mg Tabs (Amitriptyline hcl) .... One by mouth q hs 4)  Prilosec 20 Mg Cpdr (Omeprazole) .... Take  one 30-60 min before first meal of the day 5)  Prozac 20 Mg Caps (Fluoxetine hcl) .... One by mouth q am 6)  Norvasc 5 Mg Tabs (Amlodipine besylate) .... Once daily 7)  Finasteride 5 Mg Tabs (Finasteride) .Marland Kitchen.. 1 once daily 8)  Flomax 0.4 Mg Cp24 (Tamsulosin hcl) .... One by mouth daily 9)  Neurontin 300 Mg Caps (Gabapentin) .... One by mouth twice daily 10)  Naprosyn 500 Mg Tabs (Naproxen) .... Take 1 tablet by mouth two times a day 11)  Vitamin D3 1000 Unit Caps (Cholecalciferol) .... 2 once daily 12)  Benicar 40 Mg Tabs (Olmesartan medoxomil) .Marland Kitchen.. 1 once daily 13)  Oxycodone Hcl 15 Mg Tabs (Oxycodone hcl) .... One by mouth qid  Hypertension Assessment/Plan:      The patient's hypertensive risk group is category C: Target organ damage and/or diabetes.  His calculated 10 year risk of coronary heart disease is 27 %.  Today's blood pressure is 110/60.  His blood pressure goal is < 130/80.  Patient Instructions: 1)  Please schedule a follow-up appointment in 3 months. Prescriptions: OXYCODONE HCL 15 MG TABS (OXYCODONE HCL) one by mouth QID  #120 x 0   Entered and Authorized by:   Ricard Dillon MD   Signed by:   Ricard Dillon  MD on 04/23/2010   Method used:   Print then Give to Patient   RxID:   [id number] OXYCODONE HCL 15 MG TABS (OXYCODONE HCL) one by mouth QID  #120 x 0   Entered and Authorized by:   Ricard Dillon MD   Signed by:   Ricard Dillon MD on 04/23/2010   Method used:   Print then Give to Patient   RxID:   [id number] OXYCODONE HCL 15 MG TABS (OXYCODONE HCL) one by mouth QID  #120 x 0   Entered and Authorized by:   Ricard Dillon MD   Signed by:   Ricard Dillon MD on 04/23/2010   Method used:   Print then Give to Patient   RxID:   [id number]

## 2010-11-11 NOTE — Assessment & Plan Note (Signed)
Summary: Pulmonary/ fu ov   Copy to:  Dr. Benay Pillow Primary Provider/Referring Provider:  Dr. Benay Pillow  CC:  6 wk followup.  Pt still c/o chest congestion.  States that this is the same as before.  Denies any cough.  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  August 12, 2009 ov c/o sensation of throat and chest congestion > cough but not bringing up any mucus constantly aware of it except when sleeping when it doesn't bother him at all.  rec d/c ace and fish oil and rx gerd.     September 21, 2009  1 month followup with cxr.  Pt states that he still feels like he has chest congestion.  Feels like he has to clear his throat frequently.  He states that breathing is a little improved since last seen but much better than baseline.    November 03, 2009 denies doe, cough better to his satisfaction but still clearing throat, using tic tacs all the time although on list of diet restrictions given at last ov.  Pt denies any significant sore throat, dysphagia, itching, sneezing,  nasal congestion or excess secretions,  fever, chills, sweats, unintended wt loss, pleuritic or exertional cp, hempoptysis, change in activity tolerance  orthopnea pnd or leg swelling Pt also denies any obvious fluctuation in symptoms with weather or environmental change or other alleviating or aggravating factors.       Current Medications (verified): 1)  Aspirin 81 Mg  Tabs (Aspirin) .... Take 1 Tablet By Mouth Once A Day 2)  Glucovance 2.5-500 Mg  Tabs (Glyburide-Metformin) .... Take 1 Tablet By Mouth Two Times A Day 3)  Acyclovir 800 Mg  Tabs (Acyclovir) .... Take 1 Tablet By Mouth Once A Day 4)  Amitriptyline Hcl 25 Mg  Tabs (Amitriptyline Hcl) .... One By Mouth Q Hs 5)  Prilosec 20 Mg Cpdr (Omeprazole) .... Take One 30-60 Min Before First and Last  Meals of The Day 6)  Prozac 20 Mg Caps (Fluoxetine Hcl) .... One By Mouth Q Am 7)  Norvasc 5 Mg  Tabs (Amlodipine Besylate) .... Once Daily 8)  Finasteride 5 Mg Tabs (Finasteride) .Marland Kitchen.. 1 Once Daily 9)  Flomax 0.4 Mg  Cp24 (Tamsulosin Hcl) .... One By Mouth Daily 10)  Neurontin 300 Mg  Caps (Gabapentin) .... One By Mouth Twice Daily 11)  Endocet 10-325 Mg Tabs (Oxycodone-Acetaminophen) .... One By Mouth Q 4 Hour As Needed Pain 12)  Vicodin Hp 10-660 Mg Tabs (Hydrocodone-Acetaminophen) .... One By Mouth Q 6 Hours As Needed  For Breakthrough Pain 13)  Naprosyn 500 Mg  Tabs (Naproxen) .... Take 1 Tablet By Mouth Two Times A Day 14)  Vitamin D3 1000 Unit Caps (Cholecalciferol) .... 2 Once Daily 15)  Benzonatate 200 Mg Caps (Benzonatate) .... One By Mouth Q 6 Hour As Needed Cough 16)  Percocet 7.5-325 Mg Tabs (Oxycodone-Acetaminophen) .... One By Mouth Q 6 Hour As Needed Pain 17)  Benicar 40 Mg Tabs (Olmesartan Medoxomil) .... One By Mouth Daily  Allergies (verified): No Known Drug Allergies  Past History:  Past Medical History: Hyperlipidemia Hypertension    - Try off ACE August 12, 2009 for upper airway cough Cough...................................................................................Marland KitchenWert Diabetes mellitus, type II degenerative joint disease Depression Benign prostatic hypertrophy Low back pain  Vital Signs:  Patient profile:   70 year old  male Weight:      225.25 pounds O2 Sat:      98 % on Room air Temp:     97.6 degrees F oral Pulse rate:   89 / minute BP sitting:   120 / 68  (left arm)  Vitals Entered By: Tilden Dome (November 03, 2009 10:04 AM)  O2 Flow:  Room air  Physical Exam  Additional Exam:  wt   217 August 12, 2009 > 224 September 21, 2009  > 225 November 03, 2009  amb obese wm nad HEENT: full set of dentures with nl  turbinates, and orophanx. Nl external ear canals without cough reflex NECK :  without JVD/Nodes/TM/ nl carotid upstrokes  bilaterally LUNGS: no acc muscle use, clear to A and P bilaterally without cough on insp or exp maneuvers CV:  RRR  no s3 or murmur or increase in P2, no edema  ABD:  soft and nontender with nl excursion in the supine position. No bruits or organomegaly, bowel sounds nl MS:  warm without deformities, calf tenderness, cyanosis or clubbing       Clinical Reports Reviewed:  CXR:  09/21/2009: CXR Results:  CHEST - 2 VIEW   Comparison: 07/06/2009   Findings: No active infiltrate.  Linear densities at the right lung base are compatible with scarring and/or mild subsegmental atelectasis.  Cardiomediastinal silhouette unremarkable.  Small hiatal hernia.   IMPRESSION: Negative for infiltrate.  Subsegmental atelectasis versus linear scarring at the right lung base.   Impression & Recommendations:  Problem # 1:  COUGH (ICD-786.2) Upper airway cough syndrome, so named because it's frequently impossible to sort out how much is LPR vs CR/sinusitis with freq throat clearing generating secondary extra esophageal GERD from wide swings in gastric pressure that occur with throat clearing, promoting self use of mint and menthol lozenges that reduce the lower esophageal sphincter tone and exacerbate the problem further.  These symptoms are easily confused with asthma/copd by even experienced pulmonogists because they overlap so much. These are the same pts who not infrequently have failed to tolerate ace inhibitors,  dry powder inhalers or biphosphonates or report having reflux symptoms that don't respond to standard doses of PPI  therefore continue off ace, on h2 at hs and avoid mint and menthol  Problem # 2:  HYPERTENSION (ICD-401.9) continue off ace.  ACE inhibitors are problematic in  pts with airway complaints because  even experienced pulmononlogists can't always distinguish ace effects from copd/asthma.  By themselves they don't actually cause a problem, much like oxygen can't by itself start a  fire, but they certainly serve as a powerful catalyst or enhancer for any "fire"  or inflammatory process in the upper airway, be it caused by an ET  tube or more commonly reflux (especially in the obese or pts with known GERD or who are on biphoshonates). ACE inhibitors are problematic in  pts with airway complaints because  even experienced pulmononlogists can't always distinguish ace effects from copd/asthma.  By themselves they don't actually cause a problem, much like oxygen can't by itself start a fire, but they certainly serve as a powerful catalyst or enhancer for any "fire"  or inflammatory process in the upper airway, be it caused by an ET  tube or more commonly reflux (especially in the obese or pts with known GERD or who are on biphoshonates).   His updated medication list for this problem includes:    Norvasc 5 Mg Tabs (Amlodipine besylate) ..... Once daily  Benicar 40 Mg Tabs (Olmesartan medoxomil) ..... One by mouth daily  Medications Added to Medication List This Visit: 1)  Prilosec 20 Mg Cpdr (Omeprazole) .... Take  one 30-60 min before first meal of the day  Other Orders: Est. Patient Level III [id number])  Patient Instructions: 1)  Try reducing the prilosec (omeprazole) to Take  one 30-60 min before first meal of the day 2)  If having any respiratory symptoms in evening or while sleeping add pepcid 20 mg one at bedtime 3)  GERD (REFLUX)  is a common cause of respiratory symptoms. It commonly presents without heartburn and can be treated with medication, but also with lifestyle changes including avoidance of late meals, excessive alcohol, smoking cessation, and avoid fatty foods, chocolate, peppermint, colas, red wine, and acidic juices such as orange juice. NO MINT OR MENTHOL PRODUCTS SO NO COUGH DROPS  4)  USE SUGARLESS CANDY INSTEAD (jolley ranchers)  5)  NO OIL BASED VITAMINS  6)  Please schedule a follow-up appointment in 6 weeks with PFT's

## 2010-11-11 NOTE — Assessment & Plan Note (Signed)
Summary: 1 month follow up/cjr   Vital Signs:  Patient profile:   70 year old male Height:      72 inches Weight:      218 pounds BMI:     29.67 Temp:     98.2 degrees F oral Pulse rate:   72 / minute Resp:     14 per minute BP sitting:   130 / 74  (left arm)  Vitals Entered By: Allyne Gee, LPN (May  5, 624THL [id number] AM) CC: roa, Hypertension Management   Primary Care Provider:  Dr. Benay Pillow  CC:  roa and Hypertension Management.  History of Present Illness: Pt presents for control of back pain he has been on the oxycodone has experience back tension and pain that increases as the day advances  pain control is the main issue of today I have spent greater that 30 min face to face evaluating this patient 1/2 in pain cotrol counsilling  Hypertension History:      He denies headache, chest pain, palpitations, dyspnea with exertion, orthopnea, PND, peripheral edema, visual symptoms, neurologic problems, syncope, and side effects from treatment.        Positive major cardiovascular risk factors include male age 57 years old or older, diabetes, hyperlipidemia, and hypertension.  Negative major cardiovascular risk factors include non-tobacco-user status.     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  (ICD-715.36) 9)  Low Back Pain  (ICD-724.2) 10)  Benign Prostatic Hypertrophy  (ICD-600.00) 11)  Back Pain, Chronic  (ICD-724.5) 12)  Depression  (ICD-311) 13)  Diabetes  Mellitus, Type II  (ICD-250.00) 14)  Family History of Melanoma  (ICD-V16.8) 15)  Family History Diabetes 1st Degree Relative  (ICD-V18.0) 16)  Dm  (ICD-250.00) 17)  Degenerative Joint Disease  (ICD-715.90) 18)  Hypertension  (ICD-401.9) 19)  Hyperlipidemia  (ICD-272.4)  Current Problems (verified): 1)  Preventive Health Care  (ICD-V70.0) 2)  COPD Unspecified  (ICD-496) 3)  Cough  (ICD-786.2) 4)  Bronchopneumonia Organism Unspecified  (ICD-485) 5)  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  (ICD-715.36) 6)  Dyspnea On Exertion  (ICD-786.09) 7)  Uns Advrs Eff Uns Rx Medicinal&biological Sbstnc  (ICD-995.20) 8)  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 Take 1 Tablet By Mouth Once A Day 2)  Glucovance 2.5-500 Mg  Tabs (Glyburide-Metformin) .... Take 1 Tablet By Mouth Two Times A Day 3)  Acyclovir 800 Mg  Tabs (Acyclovir) .... Take 1 Tablet By Mouth Once A Day 4)  Amitriptyline Hcl 25 Mg  Tabs (Amitriptyline Hcl) .... One By Mouth Q Hs 5)  Prilosec 20 Mg Cpdr (Omeprazole) .... Take  One 30-60 Min Before First Meal of The Day 6)  Prozac 20 Mg Caps (Fluoxetine Hcl) .... One By Mouth Q Am 7)  Norvasc 5 Mg  Tabs (Amlodipine Besylate) .... Once Daily 8)  Finasteride 5 Mg Tabs (Finasteride) .Marland Kitchen.. 1 Once Daily 9)  Flomax 0.4 Mg  Cp24 (Tamsulosin Hcl) .... One By Mouth Daily 10)  Neurontin 300 Mg  Caps (Gabapentin) .... One By Mouth Twice Daily 11)  Naprosyn 500 Mg  Tabs (Naproxen)  .... Take 1 Tablet By Mouth Two Times A Day 12)  Vitamin D3 1000 Unit Caps (Cholecalciferol) .... 2 Once Daily 13)  Benzonatate 200 Mg Caps (Benzonatate) .... One By Mouth Q 6 Hour As Needed Cough 14)  Losartan Potassium 100 Mg Tabs (Losartan Potassium) .Marland Kitchen.. 1 Once Daily 15)  Benicar 40 Mg Tabs (Olmesartan Medoxomil) .Marland Kitchen.. 1 Once Daily 16)  Oxycodone Hcl 10 Mg Tabs (Oxycodone Hcl) .Marland Kitchen.. 1 Four Times A Day As Needed  Current Medications (verified): 1)  Aspirin 81 Mg  Tabs (Aspirin) .... Take 1 Tablet By Mouth Once A Day 2)  Glucovance 2.5-500 Mg  Tabs (Glyburide-Metformin) .... Take 1 Tablet By Mouth Two Times A Day 3)  Acyclovir 800 Mg  Tabs (Acyclovir) .... Take 1 Tablet By Mouth Once A Day 4)  Amitriptyline Hcl 25 Mg  Tabs (Amitriptyline Hcl) .... One By Mouth Q Hs 5)  Prilosec 20 Mg Cpdr (Omeprazole) .... Take  One 30-60 Min Before First Meal of The Day 6)  Prozac 20 Mg Caps (Fluoxetine Hcl) .... One By Mouth Q Am 7)  Norvasc 5 Mg  Tabs (Amlodipine Besylate) .... Once Daily 8)  Finasteride 5 Mg Tabs (Finasteride) .Marland Kitchen.. 1 Once Daily 9)  Flomax 0.4 Mg  Cp24 (Tamsulosin Hcl) .... One By Mouth Daily 10)  Neurontin 300 Mg  Caps (Gabapentin) .... One By Mouth Twice Daily 11)  Naprosyn 500 Mg  Tabs (Naproxen) .... Take 1 Tablet By Mouth Two Times A Day 12)  Vitamin D3 1000 Unit Caps (Cholecalciferol) .... 2 Once Daily 13)  Benzonatate 200 Mg Caps (Benzonatate) .... One By Mouth Q 6 Hour As Needed Cough 14)  Losartan Potassium 100 Mg Tabs (Losartan Potassium) .Marland Kitchen.. 1 Once Daily 15)  Benicar 40 Mg Tabs (Olmesartan Medoxomil) .Marland Kitchen.. 1 Once Daily 16)  Oxycodone Hcl 10 Mg Tabs (Oxycodone Hcl) .Marland Kitchen.. 1 Four Times A Day As Needed  Allergies (verified): No Known Drug Allergies  Past History:  Family History: Last updated: 08/12/2009 Family History Diabetes 1st degree relative Family History of Melanoma Family History of Stroke M 1st degree relative <50 Fam hx fibromyalgia Negative for respiratory  diseases or atopy   Social History: Last updated: 08/12/2009 Married Children Retired from [employer] work.  Previous exposure to chemicals at his job. Former Smoker.  Quit in 1985.  Smoked for 15-20 yrs up to 3-45 ppd. Alcohol use-no Drug use-no  Risk Factors: Exercise: no (06/20/2007)  Risk Factors: Smoking Status: quit (02/11/2010) Packs/Day: 4.0 (02/11/2010)  Past medical, surgical, family and social histories (including risk factors) reviewed, and no changes noted (except as noted below).  Past Medical History: Reviewed history from 12/18/2009 and no changes required. Hyperlipidemia Hypertension    - Try off ACE August 12, 2009 for upper airway cough Cough...................................................................................Marland KitchenWert     -  Sinus ct December 18, 2009 :  Minor mucosal thickening at the right frontoethmoidal recess, otherwise negative sinuses. COPD     - PFT's December 15, 2009 FEV1 2.30 (70%) ratio 63 no better with B2 and DLC0 18/6 (73%) corrects to 106% Diabetes mellitus, type II degenerative joint disease Depression Benign prostatic hypertrophy Low back pain  Past Surgical History: Reviewed history from 04/06/2007 and no changes required. [id number]  Family History: Reviewed history from 08/12/2009 and no changes required. Family History Diabetes 1st degree relative Family History of Melanoma Family History of Stroke M 1st degree relative <50 Fam hx fibromyalgia Negative for respiratory diseases or atopy   Social History: Reviewed history from 08/12/2009 and no changes required. Married Children Retired from [employer] work.  Previous exposure to chemicals at his job. Former Smoker.  Quit in 1985.  Smoked for 15-20 yrs up to 3-45 ppd. Alcohol use-no Drug use-no  Review of Systems  The patient denies anorexia, fever, weight loss, weight gain, vision loss, decreased hearing, hoarseness, chest pain, syncope, dyspnea on exertion,  peripheral edema, prolonged cough, headaches, hemoptysis, abdominal pain, melena, hematochezia, severe indigestion/heartburn, hematuria, incontinence, genital sores, muscle weakness, suspicious skin lesions, transient blindness, difficulty walking, depression, unusual weight change, abnormal bleeding, enlarged lymph nodes, angioedema, breast masses, and testicular masses.    Physical Exam  General:  Well-developed,well-nourished,in no acute distress; alert,appropriate and cooperative throughout examination Head:  normocephalic, atraumatic, and no abnormalities observed.   Eyes:  pupils equal and pupils round.   Ears:  R ear normal and L ear normal.   Nose:  no external deformity and no nasal discharge.   Mouth:  pharynx pink and moist and no erythema.   Lungs:  normal respiratory effort.   increased inspiratory toe exp ratio no wheezing and egophany has cleared Heart:  normal rate and regular rhythm.   Abdomen:  soft, non-tender, and normal bowel sounds.  distend  and tense   Impression & Recommendations:  Problem # 1:  LOW BACK PAIN (ICD-724.2) Assessment Improved muslce spasm  in the psoas muslce His updated medication list for this problem includes:    Aspirin 81 Mg Tabs (Aspirin) .Marland Kitchen... Take 1 tablet by mouth once a day    Naprosyn 500 Mg Tabs (Naproxen) .Marland Kitchen... Take 1 tablet by mouth two times a day    Oxycontin 20 Mg Xr12h-tab (Oxycodone hcl) ..... One by mouth  every 8 hours  Problem # 2:  HYPERTENSION (ICD-401.9) Assessment: Improved  His updated medication list for this problem includes:    Norvasc 5 Mg Tabs (Amlodipine besylate) ..... Once daily    Losartan Potassium 100 Mg Tabs (Losartan potassium) .Marland Kitchen... 1 once daily    Benicar 40 Mg Tabs (Olmesartan medoxomil) .Marland Kitchen... 1 once daily  BP today: 130/74 Prior BP: 136/70 (01/11/2010)  Prior 10 Yr Risk Heart Disease: 33 % (01/11/2010)  Labs Reviewed: K+: 4.8 (01/04/2010) Creat: : 0.8 (01/04/2010)   Chol: 182 (01/04/2010)    HDL: 35.40 (01/04/2010)   LDL: 135 (01/28/2008)   TG: 241.0 (01/04/2010)  Problem # 3:  BACK PAIN, CHRONIC (ICD-724.5)  His updated medication list for this problem includes:    Aspirin 81 Mg Tabs (Aspirin) .Marland Kitchen... Take 1 tablet by mouth once a day    Naprosyn 500 Mg Tabs (Naproxen) .Marland Kitchen... Take 1 tablet by mouth two times a day    Oxycontin 20 Mg Xr12h-tab (Oxycodone hcl) ..... One by mouth  every 8 hours  Discussed use of moist heat or ice, modified activities,  medications, and stretching/strengthening exercises. Back care instructions given. To be seen in 2 weeks if no improvement; sooner if worsening of symptoms.   Complete Medication List: 1)  Aspirin 81 Mg Tabs (Aspirin) .... Take 1 tablet by mouth once a day 2)  Glucovance 2.5-500 Mg Tabs (Glyburide-metformin) .... Take 1 tablet by mouth two times a day 3)  Acyclovir 800 Mg Tabs (Acyclovir) .... Take 1 tablet by mouth once a day 4)  Amitriptyline Hcl 25 Mg Tabs (Amitriptyline hcl) .... One by mouth q hs 5)  Prilosec 20 Mg Cpdr (Omeprazole) .... Take  one 30-60 min before first meal of the day 6)  Prozac 20 Mg Caps (Fluoxetine hcl) .... One by mouth q am 7)  Norvasc 5 Mg Tabs (Amlodipine besylate) .... Once daily 8)  Finasteride 5 Mg Tabs (Finasteride) .Marland Kitchen.. 1 once daily 9)  Flomax 0.4 Mg Cp24 (Tamsulosin hcl) .... One by mouth daily 10)  Neurontin 300 Mg Caps (Gabapentin) .... One by mouth twice daily 11)  Naprosyn 500 Mg Tabs (Naproxen) .... Take 1 tablet by mouth two times a day 12)  Vitamin D3 1000 Unit Caps (Cholecalciferol) .... 2 once daily 13)  Benzonatate 200 Mg Caps (Benzonatate) .... One by mouth q 6 hour as needed cough 14)  Losartan Potassium 100 Mg Tabs (Losartan potassium) .Marland Kitchen.. 1 once daily 15)  Benicar 40 Mg Tabs (Olmesartan medoxomil) .Marland Kitchen.. 1 once daily 16)  Oxycontin 20 Mg Xr12h-tab (Oxycodone hcl) .... One by mouth  every 8 hours  Hypertension Assessment/Plan:      The patient's hypertensive risk group is category C:  Target organ damage and/or diabetes.  His calculated 10 year risk of coronary heart disease is 33 %.  Today's blood pressure is 130/74.  His blood pressure goal is < 130/80.  Patient Instructions: 1)  Please schedule a follow-up appointment in 1 month. Prescriptions: OXYCONTIN 20 MG XR12H-TAB (OXYCODONE HCL) one by mouth  every 8 hours  #90 x 0   Entered and Authorized by:   Ricard Dillon MD   Signed by:   Ricard Dillon MD on 02/11/2010   Method used:   Print then Give to Patient   RxID:   [id number]

## 2010-11-11 NOTE — Miscellaneous (Signed)
Summary: Orders Update pft charges  Clinical Lists Changes  Orders: Added new Service order of Carbon Monoxide diffusing w/capacity (94720) - Signed Added new Service order of Lung Volumes (94240) - Signed Added new Service order of Spirometry (Pre & Post) (94060) - Signed 

## 2010-11-11 NOTE — Assessment & Plan Note (Signed)
Summary: Pulmonary/ final summary ov with doucmented pnds   Copy to:  Dr. Benay Pillow Primary Provider/Referring Provider:  Dr. Benay Pillow  CC:  6 wk followup with PFT's.  Pt c/o chest congestion and occ dry cough.  Pt states that his breathing is about the same- no better or worse.  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  rec d/c ace and fish oil and rx gerd.     September 21, 2009  1 month followup with cxr.  Pt states that he still feels like he has chest congestion.  Feels like he has to clear his throat frequently.  He states that breathing is a little improved since last seen but much better than baseline.    November 03, 2009 denies doe, cough better to his satisfaction but still clearing throat, using tic tacs all the time although on list of diet restrictions given at last ov.  rec no mint   December 15, 2009 cc 6 wk followup with PFT's.  Pt c/o chest congestion and occ dry cough.  Pt states that his breathing is about the same- no better or worse.  no excess mucus even in am. Pt denies any significant sore throat, dysphagia, itching, sneezing,  nasal congestion or excess secretions,  fever, chills, sweats, unintended wt loss, pleuritic or exertional cp, hempoptysis, change in activity tolerance  orthopnea pnd or leg swelling. Pt also denies any obvious fluctuation in symptoms with weather or environmental change or other alleviating or aggravating factors.          Current Medications (verified): 1)  Aspirin 81 Mg  Tabs (Aspirin) .... Take 1 Tablet By Mouth Once A Day 2)  Glucovance 2.5-500 Mg  Tabs (Glyburide-Metformin) .... Take 1 Tablet By  Mouth Two Times A Day 3)  Acyclovir 800 Mg  Tabs (Acyclovir) .... Take 1 Tablet By Mouth Once A Day 4)  Amitriptyline Hcl 25 Mg  Tabs (Amitriptyline Hcl) .... One By Mouth Q Hs 5)  Prilosec 20 Mg Cpdr (Omeprazole) .... Take  One 30-60 Min Before First Meal of The Day 6)  Prozac 20 Mg Caps (Fluoxetine Hcl) .... One By Mouth Q Am 7)  Norvasc 5 Mg  Tabs (Amlodipine Besylate) .... Once Daily 8)  Finasteride 5 Mg Tabs (Finasteride) .Marland Kitchen.. 1 Once Daily 9)  Flomax 0.4 Mg  Cp24 (Tamsulosin Hcl) .... One By Mouth Daily 10)  Neurontin 300 Mg  Caps (Gabapentin) .... One By Mouth Twice Daily 11)  Endocet 10-325 Mg Tabs (Oxycodone-Acetaminophen) .... One By Mouth Q 4 Hour As Needed Pain 12)  Vicodin Hp 10-660 Mg Tabs (Hydrocodone-Acetaminophen) .... One By Mouth Q 6 Hours As Needed  For Breakthrough Pain 13)  Naprosyn 500 Mg  Tabs (Naproxen) .... Take 1 Tablet By Mouth Two Times A Day 14)  Vitamin D3 1000 Unit Caps (Cholecalciferol) .... 2 Once Daily 15)  Benzonatate 200 Mg Caps (Benzonatate) .... One By Mouth Q 6 Hour As Needed Cough 16)  Percocet 7.5-325 Mg Tabs (Oxycodone-Acetaminophen) .... One By Mouth Q 6 Hour As Needed Pain 17)  Benicar 40 Mg Tabs (Olmesartan Medoxomil) .... One By Mouth Daily  Allergies (verified): No Known Drug Allergies  Past History:  Past Medical History: Hyperlipidemia Hypertension    - Try off ACE August 12, 2009 for upper airway cough Cough...................................................................................Marland KitchenWert     - Sinus ct ordered December 15, 2009 >> COPD     - PFT's December 15, 2009 FEV1 2.30 (70%) ratio 63 no better with B2 and DLC0 18/6 (73%) corrects to 106% Diabetes mellitus, type II degenerative joint disease Depression Benign prostatic hypertrophy Low back pain  Vital Signs:  Patient profile:   70 year old male Weight:      222 pounds O2 Sat:      97 % on Room air Temp:     97.5 degrees F oral Pulse rate:   82 / minute BP sitting:    110 / 60  (left arm)  Vitals Entered By: Tilden Dome (December 15, 2009 10:43 AM)  O2 Flow:  Room air  Physical Exam  Additional Exam:  wt   217 August 12, 2009 > 224 September 21, 2009  > 225 November 03, 2009  amb obese wm nad HEENT: full set of dentures with nl  turbinate.  Mucoid pnds. Nl external ear canals without cough reflex NECK :  without JVD/Nodes/TM/ nl carotid upstrokes bilaterally LUNGS: no acc muscle use, clear to A and P bilaterally without cough on insp or exp maneuvers CV:  RRR  no s3 or murmur or increase in P2, no edema  ABD:  soft and nontender with nl excursion in the supine position. No bruits or organomegaly, bowel sounds nl MS:  warm without deformities, calf tenderness, cyanosis or clubbing       CXR  Procedure date:  12/15/2009  Findings:       Comparison: 09/21/2009   Findings: Mild scarring in the lower lung zones.  No infiltrate. Suggestion of emphysematous changes in the apices. Cardiomediastinal silhouette unremarkable.  Medial left retrocardiac density is of undetermined etiology.  Considerations include small hiatal hernia and Bochdalek hernia.  Bony thorax intact.   IMPRESSION: No acute chest disease or interval changes.  Impression & Recommendations:  Problem # 1:  COUGH (ICD-786.2)  Upper airway cough syndrome, so named because it's frequently impossible to sort out how much is LPR vs CR/sinusitis with freq throat clearing generating secondary extra esophageal GERD from wide swings in gastric pressure that occur with throat clearing, promoting self use of mint and menthol lozenges that reduce the lower esophageal sphincter tone and exacerbate the problem further.  These symptoms are easily confused with asthma/copd by even experienced pulmonogists because they overlap so much. These are the same pts who not infrequently have failed to tolerate ace inhibitors,  dry powder inhalers or biphosphonates or report having reflux symptoms that  don't respond to standard doses of PPI  therefore continue off ace, on h2 at hs and avoid mint and menthol and pursue sinus w/u with evidence today of excess pnds on exam  Problem # 2:  COPD UNSPECIFIED (ICD-496) I had an extended discussion with the patient today lasting 15 to 20 minutes of a 25 minute visit on the following issues:  He has GOLD II severity mild copd s/p remote smoking cessation with a residual fev1 of > 2 liters so his prognosis is excellent and I don't recommend any treatment other than reconditioning.  Problem # 3:  HYPERTENSION (ICD-401.9)  His updated medication list for this problem includes:    Norvasc 5 Mg Tabs (Amlodipine besylate) ..... Once  daily    Benicar 40 Mg Tabs (Olmesartan medoxomil) ..... One by mouth daily  continue off ace.  ACE inhibitors are problematic in  pts with airway complaints because  even experienced pulmononlogists can't always distinguish ace effects from copd/asthma.  By themselves they don't actually cause a problem, much like oxygen can't by itself start a fire, but they certainly serve as a powerful catalyst or enhancer for any "fire"  or inflammatory process in the upper airway, be it caused by an ET  tube or more commonly reflux (especially in the obese or pts with known GERD or who are on biphoshonates). ACE inhibitors are problematic in  pts with airway complaints because  even experienced pulmononlogists can't always distinguish ace effects from copd/asthma.  By themselves they don't actually cause a problem, much like oxygen can't by itself start a fire, but they certainly serve as a powerful catalyst or enhancer for any "fire"  or inflammatory process in the upper airway, be it caused by an ET  tube or more commonly reflux (especially in the obese or pts with known GERD or who are on biphoshonates).   His updated medication list for this problem includes:    Norvasc 5 Mg Tabs (Amlodipine besylate) ..... Once daily    Benicar 40 Mg Tabs  (Olmesartan medoxomil) ..... One by mouth daily  Orders: Est. Patient Level IV [id number])  Other Orders: T-2 View CXR ([id number]) Misc. Referral (Misc. Ref)  Patient Instructions: 1)  As long as having any respiratory issue (including drainage and congestion you should prilosec Take one 30-60 min before first and last meals of the day and pepcid 20 mg one at bedtime 2)  See Patient Care Coordinator before leaving for sinus ct 3)  GERD (REFLUX)  is a common cause of respiratory symptoms. It commonly presents without heartburn and can be treated with medication, but also with lifestyle changes including avoidance of late meals, excessive alcohol, smoking cessation, and avoid fatty foods, chocolate, peppermint, colas, red wine, and acidic juices such as orange juice. NO MINT OR MENTHOL PRODUCTS SO NO COUGH DROPS  4)  USE SUGARLESS CANDY INSTEAD (jolley ranchers at [employer]) 5)  NO OIL BASED VITAMINS    CXR  Procedure date:  12/15/2009  Findings:       Comparison: 09/21/2009   Findings: Mild scarring in the lower lung zones.  No infiltrate. Suggestion of emphysematous changes in the apices. Cardiomediastinal silhouette unremarkable.  Medial left retrocardiac density is of undetermined etiology.  Considerations include small hiatal hernia and Bochdalek hernia.  Bony thorax intact.   IMPRESSION: No acute chest disease or interval changes.

## 2010-11-11 NOTE — Progress Notes (Signed)
Summary: Pt called and said pain med too expensive. Req cheaper med  Phone Note Call from Patient Call back at [facility] Phone [phone] Call back at Work Phone [phone]   Caller: Patient Reason for Call: Referral Complaint: Urinary/GYN Problems Summary of Call: Pt called and said that the pain med Dr. Arnoldo Morale prescribed is $495.00 for 90 pills, and isnt covered by [facility]. Pt is req cheaper pain med that is covered by [facility].  Initial call taken by: Braulio Bosch,  Feb 11, 2010 1:04 PM  Follow-up for Phone Call        chang eto the 15 mg QID-pt informed ready for pick up Follow-up by: Ricard Dillon MD,  Feb 12, 2010 9:39 AM    New/Updated Medications: OXYCODONE HCL 15 MG TABS (OXYCODONE HCL) one by mouth QID Prescriptions: OXYCODONE HCL 15 MG TABS (OXYCODONE HCL) one by mouth QID  #120 x 0   Entered and Authorized by:   Ricard Dillon MD   Signed by:   Ricard Dillon MD on 02/12/2010   Method used:   Print then Give to Patient   RxID:   [id number]

## 2010-11-11 NOTE — Medication Information (Signed)
Summary: Nonformulary Drug Change Request-Benicar  Nonformulary Drug Change Request-Benicar   Imported By: Laural Benes 12/22/2009 12:58:32  _____________________________________________________________________  External Attachment:    Type:   Image     Comment:   External Document

## 2010-11-16 ENCOUNTER — Telehealth: Payer: Self-pay | Admitting: Internal Medicine

## 2010-11-16 DIAGNOSIS — M549 Dorsalgia, unspecified: Secondary | ICD-10-CM

## 2010-11-16 MED ORDER — OXYCODONE-ACETAMINOPHEN 5-325 MG PO TABS: 1.0000 | ORAL_TABLET | Freq: Four times a day (QID) | ORAL | Status: DC | PRN

## 2010-11-16 NOTE — Telephone Encounter (Signed)
Refill Oxycodone/apap 5-325 mg # 120. Please call when ready.

## 2010-11-18 ENCOUNTER — Other Ambulatory Visit: Payer: Self-pay | Admitting: Internal Medicine

## 2010-11-18 DIAGNOSIS — F329 Major depressive disorder, single episode, unspecified: Secondary | ICD-10-CM

## 2010-11-18 DIAGNOSIS — I10 Essential (primary) hypertension: Secondary | ICD-10-CM

## 2010-11-18 DIAGNOSIS — G4700 Insomnia, unspecified: Secondary | ICD-10-CM

## 2010-11-18 DIAGNOSIS — K219 Gastro-esophageal reflux disease without esophagitis: Secondary | ICD-10-CM

## 2010-11-18 MED ORDER — FLUOXETINE HCL 20 MG PO TABS: 20.0000 mg | ORAL_TABLET | ORAL | Status: DC

## 2010-11-18 MED ORDER — AMITRIPTYLINE HCL 25 MG PO TABS: 25.0000 mg | ORAL_TABLET | Freq: Every day | ORAL | Status: DC

## 2010-11-18 MED ORDER — OMEPRAZOLE 20 MG PO CPDR: 20.0000 mg | DELAYED_RELEASE_CAPSULE | Freq: Every day | ORAL | Status: DC

## 2010-11-18 MED ORDER — AMLODIPINE BESYLATE 5 MG PO TABS: 5.0000 mg | ORAL_TABLET | Freq: Every day | ORAL | Status: DC

## 2010-11-22 ENCOUNTER — Other Ambulatory Visit: Payer: Self-pay | Admitting: Internal Medicine

## 2010-11-22 NOTE — Telephone Encounter (Signed)
Pt is req that he get refills of all his meds, Glucovance 2.5-500mg ,Amitriptylene HCL 25mg ,Prilosec 20mg ,Prozac 20mg ,Norvasc 5mg ,Finasteride5mg ,Flomax0.4mg ,Neurontin 300mg ,Naprosyn 500mg ,Vitamin D3 1000,Benicar 40mg ,Acyclovir 800mg ,Benzonatate 200mg . Pls send to [facility].

## 2010-11-23 ENCOUNTER — Other Ambulatory Visit: Payer: Self-pay | Admitting: *Deleted

## 2010-11-23 DIAGNOSIS — F329 Major depressive disorder, single episode, unspecified: Secondary | ICD-10-CM

## 2010-11-23 DIAGNOSIS — G4700 Insomnia, unspecified: Secondary | ICD-10-CM

## 2010-11-23 DIAGNOSIS — K219 Gastro-esophageal reflux disease without esophagitis: Secondary | ICD-10-CM

## 2010-11-23 DIAGNOSIS — I10 Essential (primary) hypertension: Secondary | ICD-10-CM

## 2010-11-23 DIAGNOSIS — N400 Enlarged prostate without lower urinary tract symptoms: Secondary | ICD-10-CM

## 2010-11-23 MED ORDER — NAPROXEN 500 MG PO TABS: 500.0000 mg | ORAL_TABLET | Freq: Two times a day (BID) | ORAL | Status: DC

## 2010-11-23 MED ORDER — AMLODIPINE BESYLATE 5 MG PO TABS: 5.0000 mg | ORAL_TABLET | Freq: Every day | ORAL | Status: DC

## 2010-11-23 MED ORDER — OMEPRAZOLE 20 MG PO CPDR: 20.0000 mg | DELAYED_RELEASE_CAPSULE | Freq: Every day | ORAL | Status: DC

## 2010-11-23 MED ORDER — AMITRIPTYLINE HCL 25 MG PO TABS: 25.0000 mg | ORAL_TABLET | Freq: Every day | ORAL | Status: DC

## 2010-11-23 MED ORDER — GABAPENTIN 300 MG PO CAPS: 300.0000 mg | ORAL_CAPSULE | Freq: Two times a day (BID) | ORAL | Status: DC

## 2010-11-23 MED ORDER — FINASTERIDE 5 MG PO TABS: 5.0000 mg | ORAL_TABLET | Freq: Every day | ORAL | Status: DC

## 2010-11-23 MED ORDER — GLYBURIDE-METFORMIN 2.5-500 MG PO TABS: 1.0000 | ORAL_TABLET | Freq: Two times a day (BID) | ORAL | Status: DC

## 2010-11-23 MED ORDER — CHOLECALCIFEROL 25 MCG (1000 UT) PO CAPS: 2000.0000 [IU] | ORAL_CAPSULE | Freq: Every day | ORAL | Status: DC

## 2010-11-23 MED ORDER — OLMESARTAN MEDOXOMIL 40 MG PO TABS: 40.0000 mg | ORAL_TABLET | Freq: Every day | ORAL | Status: DC

## 2010-11-23 MED ORDER — FLUOXETINE HCL 20 MG PO TABS: 20.0000 mg | ORAL_TABLET | ORAL | Status: DC

## 2010-11-23 MED ORDER — TAMSULOSIN HCL 0.4 MG PO CAPS: 0.4000 mg | ORAL_CAPSULE | Freq: Every day | ORAL | Status: DC

## 2010-11-24 MED ORDER — ACYCLOVIR 800 MG PO TABS: 800.0000 mg | ORAL_TABLET | Freq: Two times a day (BID) | ORAL | Status: AC

## 2010-11-30 ENCOUNTER — Other Ambulatory Visit: Payer: Self-pay | Admitting: *Deleted

## 2010-11-30 DIAGNOSIS — I10 Essential (primary) hypertension: Secondary | ICD-10-CM

## 2010-11-30 MED ORDER — LOSARTAN POTASSIUM 50 MG PO TABS: 50.0000 mg | ORAL_TABLET | Freq: Two times a day (BID) | ORAL | Status: DC

## 2010-12-20 ENCOUNTER — Encounter: Payer: Self-pay | Admitting: Internal Medicine

## 2010-12-20 ENCOUNTER — Ambulatory Visit (INDEPENDENT_AMBULATORY_CARE_PROVIDER_SITE_OTHER): Payer: Medicare HMO | Admitting: Internal Medicine

## 2010-12-20 VITALS — BP 130/76 | HR 76 | Temp 98.4°F | Resp 16 | Ht 72.0 in | Wt 222.0 lb

## 2010-12-20 DIAGNOSIS — E119 Type 2 diabetes mellitus without complications: Secondary | ICD-10-CM

## 2010-12-20 DIAGNOSIS — N400 Enlarged prostate without lower urinary tract symptoms: Secondary | ICD-10-CM

## 2010-12-20 MED ORDER — OXYCODONE-ACETAMINOPHEN 5-325 MG PO TABS: 1.0000 | ORAL_TABLET | Freq: Four times a day (QID) | ORAL | Status: DC | PRN

## 2010-12-20 NOTE — Assessment & Plan Note (Signed)
Stable hemoglobin A1c with fasting blood sugars in the 100 range he appears to be stable and his diabetes his weight and blood pressure are stable we'll monitor an A1c today

## 2011-02-17 ENCOUNTER — Encounter: Payer: Self-pay | Admitting: Internal Medicine

## 2011-02-17 ENCOUNTER — Ambulatory Visit (INDEPENDENT_AMBULATORY_CARE_PROVIDER_SITE_OTHER): Payer: Self-pay | Admitting: Internal Medicine

## 2011-02-17 VITALS — BP 126/64 | HR 70 | Temp 97.7°F | Ht 72.0 in | Wt 219.2 lb

## 2011-02-17 DIAGNOSIS — J449 Chronic obstructive pulmonary disease, unspecified: Secondary | ICD-10-CM

## 2011-02-17 MED ORDER — FAMOTIDINE 20 MG PO TABS: ORAL_TABLET | ORAL | Status: DC

## 2011-02-17 NOTE — Patient Instructions (Addendum)
It is very unlikely that copd is causing your breathing limitation - work on pacing yourself with activity  Add pepcid 20 mg one at bedtime for at least a month in addition to the prilosec 40mg  30 min before before first meal - do this for a full month.   NB the  ramp to expected improvement in symptoms from an empiric trial of prilosec  (and for that matter, worsening, if a chronic effective medication is stopped)  can be measured in weeks, not days, a common misconception because this is not the same as treating heartburn (no immediate cause and effect relationship)  so that response to therapy or lack thereof can be very difficult to assess especially if the patient is not adherent to the treatment plan which includes dietary restrictions.

## 2011-02-17 NOTE — Assessment & Plan Note (Addendum)
GOLD II with markedly disproportionate symptoms and freq throat clearing daytime only  Symptoms are markedly disproportionate to objective findings and not clear this is a lung problem but pt does appear to have difficult airway management issues. DDX of  difficult airways managment all start with A and  include Adherence, Ace Inhibitors, Acid Reflux, Active Sinus Disease, Alpha 1 Antitripsin deficiency, Anxiety masquerading as Airways dz,  ABPA,  allergy(esp in young), Aspiration (esp in elderly), Adverse effects of DPI,  Active smokers, plus two Bs  = Bronchiectasis and Beta blocker use..and one C= CHF   Acid reflux is the most likely culprit here. See instructions for specific recommendations which were reviewed directly with the patient who was given a copy with highlighter outlining the key components.   Anxiety/ deconditioning also a concern but declines rehab  Active sinus dz excluded by Ct last year, would not repeat unless symptoms change

## 2011-02-17 NOTE — Progress Notes (Signed)
Subjective:    Patient ID: [name], male    DOB: 03-Jul-1941, 70 y.o.   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 rec d/c ace and fish oil and rx gerd.   September 21, 2009 1 month followup with cxr. Pt states that he still feels like he has chest congestion. Feels like he has to clear his throat frequently. He states that breathing is a little improved since last seen but much better than baseline.   November 03, 2009 denies doe, cough better to his satisfaction but still clearing throat, using tic tacs all the time although on list of diet restrictions given at last ov. rec no mint   December 15, 2009 cc 6 wk followup with PFT's. Pt c/o chest congestion and occ dry cough. Pt states that his breathing is about the same- no better or worse. no excess mucus even in am. Imp UACS not limited by copd rec  1) As long as having any respiratory issue (including drainage and congestion you should prilosec Take one 30-60 min before first and last meals of the day and pepcid 20 mg one at bedtime  2) See Patient Care Coordinator before leaving for sinus ct  > neg 3) GERD (REFLUX) diet  02/17/2011 ov/Cruze Zingaro cc doe x 100 ft "every time I try" x one year, never changes  ? Never?  No never, then walked here x 450 with no desats or sob,  Then responded "well, I'm only sob in afternoons after I work all day", continues with sensation of too much throat drainage but no excess mucus, no following recs for ppi or H1 or H2 as per last set of written recs.  Sleeping ok without nocturnal  or early am exac of resp c/o's or need for noct saba.   Pt denies any significant sore throat, dysphagia,  itching, sneezing,  nasal congestion or excess/ purulent secretions,  fever, chills, sweats, unintended wt loss, pleuritic or exertional cp, hempoptysis, orthopnea pnd or leg swelling.    Also denies any obvious fluctuation of symptoms with weather or environmental changes or other aggravating or alleviating factors.     Past Medical History:  Hyperlipidemia  Hypertension  - Try off ACE August 12, 2009 for upper airway cough  Cough...................................................................................Marland KitchenWert  - Sinus ct   December 18 2009 > Minor mucosal thickening at the right frontoethmoidal recess,  otherwise negative sinuses. COPD  - PFT's December 15, 2009 FEV1 2.30 (70%) ratio 63 no better with B2 and DLC0 18.6 (73%) corrects to 106%  - Nl walking sats x 3 laps 02/17/2011  -Recruited for copd study 02/2011 Diabetes mellitus, type II  degenerative joint disease  Depression  Benign prostatic hypertrophy  Low back pain           Review of Systems     Objective:   Physical Exam      wt 217 August 12, 2009 > 224 September 21, 2009 > 225 November 03, 2009 > 219 02/17/2011  amb obese wm nad  HEENT: full set of dentures with nl turbinate. Mucoid pnds. Nl external ear canals without cough reflex  NECK : without JVD/Nodes/TM/ nl  carotid upstrokes bilaterally  LUNGS: no acc muscle use, clear to A and P bilaterally without cough on insp or exp maneuvers  CV: RRR no s3 or murmur or increase in P2, no edema  ABD: soft and quite protuberant, obese nontender with nl excursion in the supine position. No bruits or organomegaly, bowel sounds nl  MS: warm without deformities, calf tenderness, cyanosis or clubbing    Assessment & Plan:

## 2011-02-18 ENCOUNTER — Encounter (INDEPENDENT_AMBULATORY_CARE_PROVIDER_SITE_OTHER): Payer: Self-pay

## 2011-02-18 DIAGNOSIS — J449 Chronic obstructive pulmonary disease, unspecified: Secondary | ICD-10-CM

## 2011-02-23 ENCOUNTER — Encounter (INDEPENDENT_AMBULATORY_CARE_PROVIDER_SITE_OTHER): Payer: Self-pay

## 2011-02-23 ENCOUNTER — Other Ambulatory Visit: Payer: Medicare HMO

## 2011-02-23 DIAGNOSIS — J8410 Pulmonary fibrosis, unspecified: Secondary | ICD-10-CM

## 2011-03-21 ENCOUNTER — Ambulatory Visit: Payer: Medicare HMO | Admitting: Internal Medicine

## 2011-03-28 ENCOUNTER — Ambulatory Visit (INDEPENDENT_AMBULATORY_CARE_PROVIDER_SITE_OTHER): Payer: Medicare HMO | Admitting: Internal Medicine

## 2011-03-28 VITALS — BP 110/70 | HR 76 | Temp 98.6°F | Resp 16 | Ht 72.0 in | Wt 220.0 lb

## 2011-03-28 DIAGNOSIS — Z0000 Encounter for general adult medical examination without abnormal findings: Secondary | ICD-10-CM

## 2011-04-06 ENCOUNTER — Inpatient Hospital Stay (HOSPITAL_COMMUNITY)
Admission: EM | Admit: 2011-04-06 | Discharge: 2011-04-12 | DRG: 189 | Disposition: A | Discharge status: Home Health | Payer: Medicare HMO | Attending: Internal Medicine | Admitting: Internal Medicine

## 2011-04-06 ENCOUNTER — Emergency Department (HOSPITAL_COMMUNITY): Payer: Medicare HMO

## 2011-04-06 DIAGNOSIS — Z87891 Personal history of nicotine dependence: Secondary | ICD-10-CM

## 2011-04-06 DIAGNOSIS — J9620 Acute and chronic respiratory failure, unspecified whether with hypoxia or hypercapnia: Principal | ICD-10-CM | POA: Diagnosis present

## 2011-04-06 DIAGNOSIS — IMO0002 Reserved for concepts with insufficient information to code with codable children: Secondary | ICD-10-CM

## 2011-04-06 DIAGNOSIS — J8409 Other alveolar and parieto-alveolar conditions: Secondary | ICD-10-CM | POA: Diagnosis present

## 2011-04-06 DIAGNOSIS — D72829 Elevated white blood cell count, unspecified: Secondary | ICD-10-CM | POA: Diagnosis present

## 2011-04-06 DIAGNOSIS — J449 Chronic obstructive pulmonary disease, unspecified: Secondary | ICD-10-CM | POA: Diagnosis present

## 2011-04-06 DIAGNOSIS — I498 Other specified cardiac arrhythmias: Secondary | ICD-10-CM | POA: Diagnosis present

## 2011-04-06 DIAGNOSIS — J4489 Other specified chronic obstructive pulmonary disease: Secondary | ICD-10-CM | POA: Diagnosis present

## 2011-04-06 DIAGNOSIS — N179 Acute kidney failure, unspecified: Secondary | ICD-10-CM | POA: Diagnosis not present

## 2011-04-06 DIAGNOSIS — I447 Left bundle-branch block, unspecified: Secondary | ICD-10-CM | POA: Diagnosis present

## 2011-04-06 DIAGNOSIS — E1149 Type 2 diabetes mellitus with other diabetic neurological complication: Secondary | ICD-10-CM | POA: Diagnosis present

## 2011-04-06 DIAGNOSIS — K219 Gastro-esophageal reflux disease without esophagitis: Secondary | ICD-10-CM | POA: Diagnosis present

## 2011-04-06 DIAGNOSIS — E1142 Type 2 diabetes mellitus with diabetic polyneuropathy: Secondary | ICD-10-CM | POA: Diagnosis present

## 2011-04-06 DIAGNOSIS — J189 Pneumonia, unspecified organism: Secondary | ICD-10-CM | POA: Diagnosis present

## 2011-04-06 DIAGNOSIS — Z111 Encounter for screening for respiratory tuberculosis: Secondary | ICD-10-CM

## 2011-04-06 DIAGNOSIS — I10 Essential (primary) hypertension: Secondary | ICD-10-CM | POA: Diagnosis present

## 2011-04-06 DIAGNOSIS — Z9981 Dependence on supplemental oxygen: Secondary | ICD-10-CM

## 2011-04-06 DIAGNOSIS — E875 Hyperkalemia: Secondary | ICD-10-CM | POA: Diagnosis not present

## 2011-04-06 DIAGNOSIS — N400 Enlarged prostate without lower urinary tract symptoms: Secondary | ICD-10-CM | POA: Diagnosis present

## 2011-04-06 DIAGNOSIS — E869 Volume depletion, unspecified: Secondary | ICD-10-CM | POA: Diagnosis present

## 2011-04-06 DIAGNOSIS — Z7982 Long term (current) use of aspirin: Secondary | ICD-10-CM

## 2011-04-06 LAB — CBC
Hemoglobin: 11 g/dL — ABNORMAL LOW (ref 13.0–17.0)
MCH: 31.3 pg (ref 26.0–34.0)
MCV: 93.4 fL (ref 78.0–100.0)
RBC: 3.51 MIL/uL — ABNORMAL LOW (ref 4.22–5.81)

## 2011-04-06 LAB — DIFFERENTIAL
Basophils Relative: 0 % (ref 0–1)
Eosinophils Relative: 1 % (ref 0–5)
Lymphocytes Relative: 10 % — ABNORMAL LOW (ref 12–46)
Monocytes Absolute: 0.7 10*3/uL (ref 0.1–1.0)
Neutro Abs: 11.1 10*3/uL — ABNORMAL HIGH (ref 1.7–7.7)

## 2011-04-07 ENCOUNTER — Inpatient Hospital Stay (HOSPITAL_COMMUNITY): Payer: Medicare HMO

## 2011-04-07 ENCOUNTER — Emergency Department (HOSPITAL_COMMUNITY): Payer: Medicare HMO

## 2011-04-07 ENCOUNTER — Telehealth: Payer: Self-pay | Admitting: Internal Medicine

## 2011-04-07 LAB — CK TOTAL AND CKMB (NOT AT ARMC)
CK, MB: 2 ng/mL (ref 0.3–4.0)
CK, MB: 3.7 ng/mL (ref 0.3–4.0)
Relative Index: INVALID (ref 0.0–2.5)
Relative Index: INVALID (ref 0.0–2.5)
Relative Index: 1.7 (ref 0.0–2.5)
Total CK: 32 U/L (ref 7–232)

## 2011-04-07 LAB — GLUCOSE, CAPILLARY
Glucose-Capillary: 226 mg/dL — ABNORMAL HIGH (ref 70–99)
Glucose-Capillary: 188 mg/dL — ABNORMAL HIGH (ref 70–99)

## 2011-04-07 LAB — STREP PNEUMONIAE URINARY ANTIGEN: Strep Pneumo Urinary Antigen: NEGATIVE

## 2011-04-07 LAB — HEPATIC FUNCTION PANEL
AST: 26 U/L (ref 0–37)
Alkaline Phosphatase: 125 U/L — ABNORMAL HIGH (ref 39–117)
Bilirubin, Direct: 0.1 mg/dL (ref 0.0–0.3)
Total Bilirubin: 0.3 mg/dL (ref 0.3–1.2)

## 2011-04-07 LAB — POCT I-STAT 3, ART BLOOD GAS (G3+)
pCO2 arterial: 36.5 mmHg (ref 35.0–45.0)
pO2, Arterial: 54 mmHg — ABNORMAL LOW (ref 80.0–100.0)

## 2011-04-07 LAB — BASIC METABOLIC PANEL
BUN: 38 mg/dL — ABNORMAL HIGH (ref 6–23)
Calcium: 8.7 mg/dL (ref 8.4–10.5)
GFR calc non Af Amer: 53 mL/min — ABNORMAL LOW (ref >60)
Glucose, Bld: 231 mg/dL — ABNORMAL HIGH (ref 70–99)

## 2011-04-07 LAB — INFLUENZA PANEL BY PCR (TYPE A & B): H1N1 flu by pcr: NOT DETECTED

## 2011-04-07 LAB — HEMOGLOBIN A1C: Mean Plasma Glucose: 137 mg/dL — ABNORMAL HIGH (ref <117)

## 2011-04-07 LAB — TROPONIN I
Troponin I: <0.3 ng/mL (ref <0.30)
Troponin I: <0.3 ng/mL (ref <0.30)

## 2011-04-07 LAB — APTT: aPTT: 30 seconds (ref 24–37)

## 2011-04-07 MED ORDER — IOHEXOL 300 MG/ML  SOLN
100.0000 mL | Freq: Once | INTRAMUSCULAR | Status: AC | PRN
  Administered 2011-04-07: 100 mL via INTRAVENOUS

## 2011-04-07 NOTE — Telephone Encounter (Signed)
Dr. Melvyn Novas what study is this pt enrolled in? Please advise.[place], CMA

## 2011-04-08 ENCOUNTER — Inpatient Hospital Stay (HOSPITAL_COMMUNITY): Payer: Medicare HMO

## 2011-04-08 DIAGNOSIS — J4489 Other specified chronic obstructive pulmonary disease: Secondary | ICD-10-CM

## 2011-04-08 DIAGNOSIS — J8410 Pulmonary fibrosis, unspecified: Secondary | ICD-10-CM

## 2011-04-08 DIAGNOSIS — J449 Chronic obstructive pulmonary disease, unspecified: Secondary | ICD-10-CM

## 2011-04-08 DIAGNOSIS — J9600 Acute respiratory failure, unspecified whether with hypoxia or hypercapnia: Secondary | ICD-10-CM

## 2011-04-08 LAB — PRO B NATRIURETIC PEPTIDE: Pro B Natriuretic peptide (BNP): 743.1 pg/mL — ABNORMAL HIGH (ref 0–125)

## 2011-04-08 LAB — BASIC METABOLIC PANEL
BUN: 50 mg/dL — ABNORMAL HIGH (ref 6–23)
CO2: 25 mEq/L (ref 19–32)
Calcium: 8.6 mg/dL (ref 8.4–10.5)
Creatinine, Ser: 1.33 mg/dL (ref 0.50–1.35)
Glucose, Bld: 106 mg/dL — ABNORMAL HIGH (ref 70–99)

## 2011-04-08 LAB — CBC
HCT: 28.3 % — ABNORMAL LOW (ref 39.0–52.0)
Hemoglobin: 9.9 g/dL — ABNORMAL LOW (ref 13.0–17.0)
MCH: 32.2 pg (ref 26.0–34.0)
MCHC: 35 g/dL (ref 30.0–36.0)

## 2011-04-08 LAB — ANA: Anti Nuclear Antibody(ANA): NEGATIVE

## 2011-04-08 LAB — GLUCOSE, CAPILLARY: Glucose-Capillary: 351 mg/dL — ABNORMAL HIGH (ref 70–99)

## 2011-04-08 LAB — DIFFERENTIAL
Basophils Relative: 0 % (ref 0–1)
Monocytes Absolute: 0.6 10*3/uL (ref 0.1–1.0)
Monocytes Relative: 4 % (ref 3–12)
Neutro Abs: 15.2 10*3/uL — ABNORMAL HIGH (ref 1.7–7.7)

## 2011-04-08 LAB — LEGIONELLA ANTIGEN, URINE

## 2011-04-09 ENCOUNTER — Inpatient Hospital Stay (HOSPITAL_COMMUNITY): Payer: Medicare HMO

## 2011-04-09 LAB — CBC
HCT: 27.1 % — ABNORMAL LOW (ref 39.0–52.0)
Hemoglobin: 9.2 g/dL — ABNORMAL LOW (ref 13.0–17.0)
MCV: 92.2 fL (ref 78.0–100.0)
RDW: 14.3 % (ref 11.5–15.5)
WBC: 12.7 10*3/uL — ABNORMAL HIGH (ref 4.0–10.5)

## 2011-04-09 LAB — DIFFERENTIAL
Eosinophils Relative: 0 % (ref 0–5)
Lymphocytes Relative: 6 % — ABNORMAL LOW (ref 12–46)
Lymphs Abs: 0.7 10*3/uL (ref 0.7–4.0)
Monocytes Absolute: 0.3 10*3/uL (ref 0.1–1.0)
Neutro Abs: 11.6 10*3/uL — ABNORMAL HIGH (ref 1.7–7.7)

## 2011-04-09 LAB — BASIC METABOLIC PANEL
BUN: 54 mg/dL — ABNORMAL HIGH (ref 6–23)
Chloride: 106 mEq/L (ref 96–112)
Creatinine, Ser: 1.26 mg/dL (ref 0.50–1.35)
GFR calc Af Amer: >60 mL/min (ref >60)
Glucose, Bld: 178 mg/dL — ABNORMAL HIGH (ref 70–99)

## 2011-04-09 LAB — GLUCOSE, CAPILLARY
Glucose-Capillary: 175 mg/dL — ABNORMAL HIGH (ref 70–99)
Glucose-Capillary: 244 mg/dL — ABNORMAL HIGH (ref 70–99)
Glucose-Capillary: 215 mg/dL — ABNORMAL HIGH (ref 70–99)

## 2011-04-10 LAB — BASIC METABOLIC PANEL
BUN: 46 mg/dL — ABNORMAL HIGH (ref 6–23)
GFR calc Af Amer: >60 mL/min (ref >60)
GFR calc non Af Amer: >60 mL/min (ref >60)
Potassium: 5.9 mEq/L — ABNORMAL HIGH (ref 3.5–5.1)
Sodium: 138 mEq/L (ref 135–145)

## 2011-04-10 LAB — LEGIONELLA PNEUMOPHILA ANTIBODIES: Legionella pneumo, IgG Type 1-6: <1:128 {titer}

## 2011-04-10 LAB — CBC
MCHC: 35.5 g/dL (ref 30.0–36.0)
RDW: 14.1 % (ref 11.5–15.5)

## 2011-04-10 LAB — GLUCOSE, CAPILLARY

## 2011-04-11 ENCOUNTER — Inpatient Hospital Stay (HOSPITAL_COMMUNITY): Payer: Medicare HMO

## 2011-04-11 DIAGNOSIS — J9600 Acute respiratory failure, unspecified whether with hypoxia or hypercapnia: Secondary | ICD-10-CM

## 2011-04-11 DIAGNOSIS — J449 Chronic obstructive pulmonary disease, unspecified: Secondary | ICD-10-CM

## 2011-04-11 DIAGNOSIS — J8410 Pulmonary fibrosis, unspecified: Secondary | ICD-10-CM

## 2011-04-11 LAB — BASIC METABOLIC PANEL
BUN: 37 mg/dL — ABNORMAL HIGH (ref 6–23)
CO2: 25 mEq/L (ref 19–32)
Calcium: 8.9 mg/dL (ref 8.4–10.5)
Chloride: 105 mEq/L (ref 96–112)
Creatinine, Ser: 1.11 mg/dL (ref 0.50–1.35)
GFR calc non Af Amer: >60 mL/min (ref >60)
GFR calc non Af Amer: >60 mL/min (ref >60)
Glucose, Bld: 193 mg/dL — ABNORMAL HIGH (ref 70–99)
Glucose, Bld: 128 mg/dL — ABNORMAL HIGH (ref 70–99)
Potassium: 4.3 mEq/L (ref 3.5–5.1)
Sodium: 138 mEq/L (ref 135–145)

## 2011-04-11 LAB — GLUCOSE, CAPILLARY: Glucose-Capillary: 169 mg/dL — ABNORMAL HIGH (ref 70–99)

## 2011-04-11 NOTE — Telephone Encounter (Signed)
Discussed with pt at bedside 04/11/2011

## 2011-04-12 LAB — GLUCOSE, CAPILLARY: Glucose-Capillary: 236 mg/dL — ABNORMAL HIGH (ref 70–99)

## 2011-04-12 LAB — BASIC METABOLIC PANEL
BUN: 33 mg/dL — ABNORMAL HIGH (ref 6–23)
Creatinine, Ser: 1.11 mg/dL (ref 0.50–1.35)
GFR calc Af Amer: >60 mL/min (ref >60)
GFR calc non Af Amer: >60 mL/min (ref >60)
Glucose, Bld: 173 mg/dL — ABNORMAL HIGH (ref 70–99)

## 2011-04-12 LAB — INFLUENZA B ABS IGG & IGM

## 2011-04-12 LAB — INFLUENZA A ABS IGG & IGM

## 2011-04-13 LAB — CULTURE, BLOOD (ROUTINE X 2)
Culture  Setup Time: 201206280826
Culture: NO GROWTH

## 2011-04-15 LAB — HYPERSENSITIVITY PNUEMONITIS PROFILE

## 2011-04-22 ENCOUNTER — Ambulatory Visit (INDEPENDENT_AMBULATORY_CARE_PROVIDER_SITE_OTHER): Payer: Medicare HMO | Admitting: Internal Medicine

## 2011-04-22 DIAGNOSIS — R0602 Shortness of breath: Secondary | ICD-10-CM

## 2011-04-22 DIAGNOSIS — IMO0001 Reserved for inherently not codable concepts without codable children: Secondary | ICD-10-CM

## 2011-04-22 DIAGNOSIS — M549 Dorsalgia, unspecified: Secondary | ICD-10-CM

## 2011-04-22 DIAGNOSIS — J8489 Other specified interstitial pulmonary diseases: Secondary | ICD-10-CM

## 2011-04-22 DIAGNOSIS — E875 Hyperkalemia: Secondary | ICD-10-CM

## 2011-04-22 DIAGNOSIS — J84116 Cryptogenic organizing pneumonia: Secondary | ICD-10-CM

## 2011-04-22 DIAGNOSIS — G8929 Other chronic pain: Secondary | ICD-10-CM

## 2011-04-22 LAB — BASIC METABOLIC PANEL
BUN: 12 mg/dL (ref 6–23)
CO2: 33 mEq/L — ABNORMAL HIGH (ref 19–32)
Calcium: 8.9 mg/dL (ref 8.4–10.5)
GFR: 90.94 mL/min (ref >60.00)
Glucose, Bld: 167 mg/dL — ABNORMAL HIGH (ref 70–99)
Potassium: 5.1 mEq/L (ref 3.5–5.1)

## 2011-04-22 MED ORDER — OXYCODONE-ACETAMINOPHEN 5-325 MG PO TABS: 1.0000 | ORAL_TABLET | Freq: Four times a day (QID) | ORAL | Status: DC | PRN

## 2011-04-22 NOTE — Progress Notes (Signed)
Subjective:    Patient ID: [name], male    DOB: Sep 20, 1941, 70 y.o.   MRN: [id number]  HPI  Patient presents for post hospital visit.  As hospitalized for a pneumonitis of uncertain etiology whether was infectious viral or bacterial or an inflammatory BOOP. He was discharged on nebulizer treatments oxygen and acyclovir.  He presents today for followup and is due for monitoring of his blood sugar as well as a monitoring of his potassium.  During his hospitalization his ARB Was stopped due to hyperkalemia that was treated with Kayexalate. The patient has a followup appointment with his pulmonologist we instructed him that he will continue the nebulizer therapy and the oxygen and told that visit His shortness of breath is better and he denies chest pain at this time he has no significant peripheral edema in his legs or any other signs of congestive heart failure due to his dyspnea I would add a BNP  Review of Systems  Constitutional: Positive for activity change and fatigue. Negative for fever.  HENT: Negative for hearing loss, congestion, neck pain and postnasal drip.   Eyes: Negative for discharge, redness and visual disturbance.  Respiratory: Positive for chest tightness, shortness of breath and wheezing. Negative for cough.   Cardiovascular: Negative for leg swelling.  Gastrointestinal: Negative for abdominal pain, constipation and abdominal distention.  Genitourinary: Negative for urgency and frequency.  Musculoskeletal: Negative for joint swelling and arthralgias.  Skin: Negative for color change and rash.  Neurological: Positive for weakness. Negative for light-headedness.  Hematological: Negative for adenopathy.  Psychiatric/Behavioral: Negative for behavioral problems.   Past Medical History  Diagnosis Date  . Hyperlipidemia   . Hypertension August 12, 2009    try off ACE for upper airway cough  . Coughing December 18, 2009    Sinus CT : Minor mucosal thickening at the  Right Frontoethmoidal recess, otherwise  negative sinuses  . COPD (chronic obstructive pulmonary disease) December 15, 2009    FEV1 2.30 (70%) ratio 63 no better with B2 and DLCO 18/6 (73%) corrects to 106%  . Diabetes mellitus type II   . Degenerative joint disease   . Depression   . Benign prostatic hypertrophy   . Low back pain    Past Surgical History  Procedure Date  . Dvt 1980    reports that he quit smoking about 26 years ago. His smoking use included Cigarettes. He has a 80 pack-year smoking history. He does not have any smokeless tobacco history on file. He reports that he does not drink alcohol or use illicit drugs. family history includes Cancer in his mother and Stroke in his father. No Known Allergies     Objective:   Physical Exam  Nursing note and vitals reviewed.  On physical examination he is a pleasant elderly white male in moderate respiratory distress color is ashen HEENT reveals arcus senilis neck is supple lungs. Heart distant with increased expiratory to inspiratory ratio no rails or wheezes were detected heart examination revealed a regular rate and rhythm with a 1/6 systolic murmur abdomen is protuberant and tense extremity examination reveals trace edema he is oriented to person place and time appropriate in judgment and cognition       Assessment & Plan:  Obtain basic metabolic panel and BMP for monitoring of heart failure and hyperkalemia agree with remaining off Benicar for now as his blood pressure is stable at 140/70 short-term monitoring in one month but she has an appointment already scheduled.  continue his pulmonary therapy as prescribed post hospital with followup pulmonary etiology of pneumonitis is unclear at this time.  He is at risk for aspiration pneumonitis due to his obesity and history of reflux attenuation of his Pepcid increased to twice a day  Refill of his Percocet for pain

## 2011-04-24 NOTE — Discharge Summary (Signed)
[name], [name] NO.:  [id number]  MEDICAL RECORD NO.:  [id number]  LOCATION:  [id number]                         FACILITY:  [facility]  PHYSICIAN:  Oren Binet, MD    DATE OF BIRTH:  1941/07/06  DATE OF ADMISSION:  04/06/2011 DATE OF DISCHARGE:  04/12/2011                              DISCHARGE SUMMARY   ADMITTING PHYSICIAN:  Orvan Falconer, MD, with [facility].  DISCHARGING PHYSICIAN:  Oren Binet, MD  PRIMARY CARE PHYSICIAN:  Ricard Dillon, MD  CONSULTANTS THIS ADMISSION:  Christena Deem. Melvyn Novas, MD, FCCP, and Dr. Alva Garnet with Pulmonary Critical Care Medicine.  CHIEF COMPLAINT/REASON FOR ADMISSION:  [name] is a 70 year old male patient with multiple medical problems including hypertension, diabetes, severe GERD that has resulted in apparent recurrent pulmonary issues for which he has followed with Dr. Melvyn Novas for several years, although he has not seen Dr. Melvyn Novas since 2010.  He also has a prior history of smoking 4 packs of cigarettes per day and per patient's self endorsement he used to drink quite heavily.  He may or may not carry a diagnosis of COPD according to the pulmonary office notes.  He presented to the emergency department because of acute shortness of breath for 1 week.  This was not associated with orthopnea or paroxysmal nocturnal dyspnea.  He routinely sleeps with the head of bed up 6 inches due to reflux.  He has had no peripheral edema.  In the emergency department, he was found to have mild leukocytosis with a white count of 13,400, hemoglobin stable at 11, a D-dimer was elevated at 4.35 and CT of the chest angiogram was negative for PE.  This did show diffuse alveolar interstitial infiltrate with mild right pleural effusion.  Creatinine stable at 1.34 at admission and sodium was 141.  His EKG demonstrated sinus tachycardia with a left bundle branch block.  No previous once to compare with.  Chest x-ray also demonstrated interstitial  and alveolar infiltrate.  ABG showed normal pCO2 at 36, normal pH but a PaO2 of 54. Cardiac markers were negative but the proBNP was mildly elevated at 800. Hospitalist Service were asked to admit the patient.  On initial exam, the patient's BP was 105/53, pulse 100, respirations 24, and temperature 98.1.  From a general examination standpoint, he was alert and conversing and was not in any acute respiratory distress.  His physical exam was unremarkable except for crackles in the right base. No wheeze, no consolidation or dullness on exam.  No peripheral edema noted.  PAST MEDICAL HISTORY: 1. Hypertension. 2. Diabetes II, on oral hypoglycemic agents. 3. Diabetic neuropathy. 4. GERD. 5. History of pneumonia. 6. Apparent history of COPD as well as reflux-mediated chronic lung     disease, not on oxygen at home. 7. 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 8. Apparent BPH.  ADMITTING DIAGNOSES: 1. Acute hypoxemic respiratory failure, etiology unclear, suspected     congestive heart failure versus community-acquired pneumonia. 2. Apparent chronic obstructive pulmonary disease, multifactorial     etiology due to prior tobacco abuse as well as chronic lung disease     due to recurrent reflux, currently compensated  without wheezing. 3. Diabetes II without significant hyperglycemia. 4. Hypertension with a relative hypotension.  DIAGNOSTICS: 1. Portable chest x-ray June 27, interval development of diffuse     bilateral interstitial alveolar infiltrates when compared with     previous chest x-ray March 2011. 2. A CT angiogram of the chest on June 28 shows no evidence of     significant pulmonary embolus.  Diffuse bilateral alveolar and     interstitial infiltrates of nonspecific etiology.  Small right     pleural effusion. 3. Portable chest x-ray on June 29 shows progression of diffuse     bilateral infiltrates which may be due to pulmonary edema.     Superimposed  pneumonia is not excluded. 4. Portable chest x-ray on June 30 that shows no significant change in     the bilateral parenchymal opacifications.  Most recent chest x-ray     is two-view on July 2 that shows diffuse bilateral airspace disease     with mild generalized improvement in aeration. 5. A 2-D echocardiogram on June 28 that shows a technically difficult     study as a result of poor acoustic windows.  The left ventricle     cavity size was within the upper limits of normal with a pattern of     moderate to severe LVH.  There was concentric hypertrophy.     Hyperdynamic systolic function estimated ejection fraction 60-70%.     Wall motion was normal.  Also findings consistent with grade 2     diastolic dysfunction.  The inferior vena cava was also dilated     consistent with mildly elevated central venous pressure about 10-15     mmHg.  No significant mitral regurgitation.  Tricuspid valve though     was described as being structurally normal without regurgitation.     The pulmonary artery is poorly visualized but the main pulmonary     artery was normal in size.  Systolic pressure could not be     accurately estimated.  No definitive findings of pulmonary     hypertension.  LABORATORY:  At presentation white count was 13,400, hemoglobin 11, platelets 155,000 with neutrophils 83%.  Most recent CBC July 1, white count 11,700 with the patient noted to be on steroids, hemoglobin 10.3, hematocrit 29 and platelets 181,000.  Cardiac isoenzymes were cycled x3 and showed no evidence of ischemia.  TSH was 0.959.  Influenza panel by PCR was negative.  Hemoglobin A1c 6.4.  Hepatic function panel was normal except for mildly elevated alkaline phosphatase at 125.  ESR was elevated at 98.  Urinary strep was negative.  Angiotensin-converting enzyme was normal at 29.  Rheumatoid factor 16 with a high normal of 20. Blood cultures x2 were checked this admission were negative. Procalcitonin 0.48.   Urinary antigen for Legionella was negative.  ANA was negative.  Cyclic Citrul pep Ab-IgG was less than 2.  HIV antibody was nonreactive.  Legionella pneumonia IgG type 1-6 was less than 1:128 ratio.  Influenza A IgG and IgM showed the IgG antibodies to be elevated at 3.93 with IgM antibodies 1.16.  Influenza B IgG antibody was high at 2.42 with the IgM antibody less than 0.80 and a nuclear antibody neutrophil cytoplasmic antibody i.e. the ANCA IgG was pending.  Final report at time of dictation and most recent proBNP on July 3 was stable at 608.  Most recent BMET on July 3; sodium 141, potassium 4.6, chloride 107, CO2 29, glucose 173, BUN 33,  creatinine 1.11, calcium 8.4.  HOSPITAL COURSE: 1. 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  The patient presented with acute hypoxemic     changes, worse over the past week.  Etiology  initially was felt     to be related to heart failure, but clinical findings and     echocardiogram did not support this.  He had initially been begun     on Lasix for diuresis, but actually dropped his blood pressure     further so he was gently hydrated and diuretic therapy was     stopped.  Because of the unusual findings on the chest CT and the    patient's prior history of complicated chronic lung disease,     Pulmonary Medicine was consulted.  The patient was initially     evaluated by Dr. Alva Garnet who felt that chest CT was consistent with an     acute pneumonitis.  He was started on steroid therapy.  Later the     patient was evaluated by his primary pulmonologist Dr. Melvyn Novas who     agreed with a pneumonitis/BOOP findings.  The patient's steroids     have been weaned as well as his oxygen requirements have been     weaned.  Dr. Melvyn Novas does want him to stay on prednisone 20 mg daily     for the next 2 weeks until he follows up with him in the office.     The patient has continued to require oxygen.  His room air      sats at rest were stable at 94% but he does desaturate into the mid     80s with ambulation, so he will continue oxygen after discharge.     He will also continue nebulizer therapy after discharge until he     follows up with Dr. Melvyn Novas and then determination can be made as to     whether he can transition over to alternative bronchodilator     therapy via metered-dose inhaler.  In addition, there was of     concern that the patient may have a bacterial etiology to his acute     hypoxemia, so he has received a total of 7 days of Avelox which     will complete on day of discharge.  At the present time the     definitive etiology of the symptoms are not clear.  May have a     viral etiology.  The patient will be following up with Dr. Melvyn Novas in     the office and has not made significant improvement.  Suspect Dr.     Melvyn Novas will proceed with additional diagnostic workup to help     clarify.  In addition, the patient's son mentioned that the patient     had been applying bug spray prior to admission.  Discussed with     the patient, he had been using a basic over-the-counter Ortho brand     bug spray and had been applying in an open area outside.     Pulmonary Medicine does not feel that this was a contributing     factor to the patient's respiratory symptoms. 2. Hyperkalemia.  For 3 days prior to discharge, the patient had been     experiencing issues with potassiums between 5.8 and 6.8.  These     results were confirmed with the laboratory and were not a result of  hemolysis.  The patient's angiotensin renin blocking agent had been     held at admission but his naproxen had been continued.  Subsequently the     naproxen was discontinued.  The patient was given treatment to     reverse the hyperkalemia including Kayexalate.  He was given     cardioprotective calcium gluconate as well as was given IV insulin     and dextrose to help shift the potassium.  As of date of discharge,     the  patient's potassium was 4.6.  Because we are unclear as to     why the patient experienced hyperkalemia, we will continue to hold     the NSAIDs as well as the ARB agents.  Because the patient will     need his usual metformin due to hyperglycemia related to     prednisone, we will continue this medication.  Recommend for the     patient follow up with his primary care physician in the next week     for further followup and to make sure the hyperkalemia does not     recur.  The primary care physician then can make a determination as     to whether these medications can be resumed and how closely the     electrolyte panel needs to be followed. 3. Hypertension.  The patient's blood pressure is well controlled on     Norvasc, BP being 130/61 today with a heart rate of 70.  He is not     receiving his usual angiotensin renin blocking agent as previously     discussed. 4. Diabetes II.  The patient's CBGs have been consistently over 200     over the past several days and we have been utilizing sliding scale     insulin.  In review of his medication sheet in comparison with his     home medications, he was receiving half his dose of usual     glyburide, here he was only receiving q.a.m. and not as usually     b.i.d. at home.  Also due to acute illness requiring     hospitalization, his metformin was routinely placed on hold.  Also     he is receiving steroids which obviously can cause significant     hyperglycemia.  We will resume his usual home medications and he     has been instructed to follow his blood sugars closely at home.  DISPOSITION:  At the present time this patient is appropriate for discharge home.  I have discussed this extensively with the patient.  He states his family has questions regarding discharge and I anticipate I will need to return to the room before the patient leaves the hospital to discuss further with his family.  Again he states his family is concerned that he is  being discharged home without a clear final cause for his lung disease.  I did discuss with the patient that he has clinically improved and although he is still continuing oxygen, it is only with ambulation and at time of sleep and anticipate as the lungs continue to heal with the use of the steroids and other treatments that he should hopefully be able to wean off the oxygen.  We also did discuss that he had been having issues with some short-term memory loss and this could be related to more chronic hypoxemia.  Please note that the patient has not followed up with Dr. Melvyn Novas since 2010 and  may not have expressed any symptomatology to any other physicians that would alert them to the fact that he would need to have an ambulatory pulse oximetry reading obtained.  Please know while here that the patient's O2 saturations dropped to 84-85% while ambulating.  Again some of these problems could be more chronic in nature and he had an acute flare-requiring hospitalization this admission.  FINAL DISCHARGE DIAGNOSES: 1. 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 2. Relative hypotension due to volume depletion at admission resolved. 3. Hyperkalemia unspecified resolved. 4. Diabetes 2 moderately controlled while here, exacerbated due to     concomitant prednisone/steroid use. 5. Hypertension, currently controlled.  DISCHARGE MEDICATIONS:  The following are new medications. 1. Albuterol nebulizer every 4 hours as needed. 2. Atrovent nebulizer q.i.d. 3. Prednisone 20 mg daily until seen by Dr. Melvyn Novas in 2 weeks.  Prior home medications. 1. Acyclovir 400 mg b.i.d. 2. Amitriptyline 25 mg at bedtime. 3. Amlodipine 5 mg daily. 4. Aspirin 81 mg daily. 5. Famotidine 20 mg at hour sleep. 6. Finasteride 5 mg daily. 7. Fluoxetine 20 mg every a.m. 8. Gabapentin 300 mg b.i.d. 9. Glyburide and metformin 2.5/500  b.i.d.Marland Kitchen 10.Over-the-counter multiple vitamins daily. 11.Omeprazole 20 mg with special instructions to take 30-60 minutes     before first meal of the day. 12.Percocet with Tylenol 5/325 one tablet every 6 hours as needed for     pain. 13.Tamsulosin 0.4 mg daily. 14.Vitamin D3 1000 units 2 capsules daily.  Stop the following medications: 1. Naproxen 500 mg b.i.d. 2. Olmesartan 40 mg daily.  Please note that the above medications were placed on hold because of recent issues with hyperkalemia.  PATIENT'S DISCHARGE INSTRUCTIONS:  Activity:  Increase activity slowly. Please use oxygen when ambulatory.  May shower or bathe. Diet:  Diabetic carb modified. Additional instructions:  Okay to use Tylenol when not taking Percocet until he follow up with Dr. Arnoldo Morale regarding possible resumption of Aleve or Naprosyn. Other treatments:  Nebulizers with the assistance of respiratory therapy and oxygen at 1-2 liters per minute when up walking while sleeping and when needed at rest if you are short of breath. Followup appointments: 1. Return to Dr. Arnoldo Morale [phone], please call to be seen in 1 week. 2. Dr. Melvyn Novas [phone], he is to see him in 2 weeks.  Eugenio Hoes,     case manager, is in the process of arranging exact followup     appointment and time.     Julian Lissa Merlin, N.P.   ______________________________ Oren Binet, MD    ALE/MEDQ  D:  04/12/2011  T:  04/12/2011  Job:  [id number]  cc:   Ricard Dillon, MD Christena Deem. Melvyn Novas, MD, [facility]  Electronically Signed by Erin Hearing N.P. on 04/14/2011 12:13:49 PM Electronically Signed by Oren Binet  on 04/24/2011 10:49:54 AM

## 2011-04-27 ENCOUNTER — Encounter: Payer: Self-pay | Admitting: Internal Medicine

## 2011-04-27 ENCOUNTER — Ambulatory Visit (INDEPENDENT_AMBULATORY_CARE_PROVIDER_SITE_OTHER)
Admission: RE | Admit: 2011-04-27 | Discharge: 2011-04-27 | Disposition: A | Discharge status: Home / Self Care | Payer: Medicare HMO | Source: Ambulatory Visit | Attending: Internal Medicine | Admitting: Internal Medicine

## 2011-04-27 ENCOUNTER — Ambulatory Visit (INDEPENDENT_AMBULATORY_CARE_PROVIDER_SITE_OTHER): Payer: Medicare HMO | Admitting: Internal Medicine

## 2011-04-27 VITALS — BP 132/76 | HR 90 | Temp 98.1°F | Ht 72.0 in | Wt 213.8 lb

## 2011-04-27 DIAGNOSIS — J84116 Cryptogenic organizing pneumonia: Secondary | ICD-10-CM

## 2011-04-27 DIAGNOSIS — J9610 Chronic respiratory failure, unspecified whether with hypoxia or hypercapnia: Secondary | ICD-10-CM

## 2011-04-27 DIAGNOSIS — J8489 Other specified interstitial pulmonary diseases: Secondary | ICD-10-CM

## 2011-04-27 MED ORDER — PREDNISONE 20 MG PO TABS: ORAL_TABLET | ORAL | Status: DC

## 2011-04-27 NOTE — Progress Notes (Deleted)
Subjective:    Patient ID: [name], male    DOB: 1941-04-06, 70 y.o.   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 rec d/c ace and fish oil and rx gerd.   September 21, 2009 1 month followup with cxr. Pt states that he still feels like he has chest congestion. Feels like he has to clear his throat frequently. He states that breathing is a little improved since last seen but much better than baseline.   November 03, 2009 denies doe, cough better to his satisfaction but still clearing throat, using tic tacs all the time although on list of diet restrictions given at last ov. rec no mint   December 15, 2009 cc 6 wk followup with PFT's. Pt c/o chest congestion and occ dry cough. Pt states that his breathing is about the same- no better or worse. no excess mucus even in am. Imp UACS not limited by copd rec  1) As long as having any respiratory issue (including drainage and congestion you should prilosec Take one 30-60 min before first and last meals of the day and pepcid 20 mg one at bedtime  2) See Patient Care Coordinator before leaving for sinus ct  > neg 3) GERD (REFLUX) diet  04/27/2011 ov/[name] cc doe x 100 ft "every time I try" x one year, never changes  ? Never?  No never, then walked here x 450 with no desats or sob,  Then responded "well, I'm only sob in afternoons after I work all day", continues with sensation of too much throat drainage but no excess mucus, no following recs for ppi or H1 or H2 as per last set of written recs.  Sleeping ok without nocturnal  or early am exac of resp c/o's or need for noct saba.   04/27/2011 ov/[name]    Past Medical History:    Hyperlipidemia  Hypertension  - Try off ACE August 12, 2009 for upper airway cough  Cough...................................................................................Marland KitchenWert  - Sinus ct   December 18 2009 > Minor mucosal thickening at the right frontoethmoidal recess,  otherwise negative sinuses. COPD  - PFT's December 15, 2009 FEV1 2.30 (70%) ratio 63 no better with B2 and DLC0 18.6 (73%) corrects to 106%  - Nl walking sats x 3 laps 04/27/2011  -Recruited for copd study 02/2011 Diabetes mellitus, type II  degenerative joint disease  Depression  Benign prostatic hypertrophy  Low back pain           Review of Systems     Objective:   Physical Exam      wt 217 August 12, 2009 > 224 September 21, 2009 > 225 November 03, 2009 > 219 04/27/2011  amb obese wm nad  HEENT: full set of dentures with nl turbinate. Mucoid pnds. Nl external ear canals without cough reflex  NECK : without JVD/Nodes/TM/ nl carotid upstrokes bilaterally  LUNGS: no acc muscle use, clear to A and P bilaterally without cough on insp or exp maneuvers  CV: RRR no s3 or murmur or increase in P2, no edema  ABD: soft and quite protuberant, obese nontender with nl excursion in the supine position. No bruits  or organomegaly, bowel sounds nl  MS: warm without deformities, calf tenderness, cyanosis or clubbing    Assessment & Plan:

## 2011-04-27 NOTE — Patient Instructions (Signed)
Reduce prednisone to 20 mg one half daily until return  See Tammy NP w/in 2 weeks with all your medications, even over the counter meds, separated in two separate bags, the ones you take no matter what vs the ones you stop once you feel better and take only as needed when you feel you need them.   Tammy  will generate for you a new user friendly medication calendar that will put [place] all on the same page re: your medication use.     Without this process, it simply isn't possible to assure that we are providing  your outpatient care  with  the attention to detail we feel you deserve.   If we cannot assure that you're getting that kind of care,  then we cannot manage your problem effectively from this clinic.  Once you have seen Tammy and we are sure that we're all on the same page with your medication use she will arrange follow up with me.

## 2011-04-27 NOTE — Progress Notes (Signed)
Subjective:    Patient ID: [name], male    DOB: 07-Aug-1941, 69 y.o.   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 rec d/c ace and fish oil and rx gerd.   September 21, 2009 1 month followup with cxr. Pt states that he still feels like he has chest congestion. Feels like he has to clear his throat frequently. He states that breathing is a little improved since last seen but much better than baseline.   November 03, 2009 denies doe, cough better to his satisfaction but still clearing throat, using tic tacs all the time although on list of diet restrictions given at last ov. rec no mint   December 15, 2009 cc 6 wk followup with PFT's. Pt c/o chest congestion and occ dry cough. Pt states that his breathing is about the same- no better or worse. no excess mucus even in am. Imp UACS not limited by copd rec  1) As long as having any respiratory issues (including drainage and congestion) you should prilosec Take one 30-60 min before first and last meals of the day and pepcid 20 mg one at bedtime  2) See Patient Care Coordinator before leaving for sinus ct  > neg 3) GERD (REFLUX) diet  02/17/2011 ov/[name] cc doe x 100 ft "every time I try" x one year, never changes  ? Never?  No never, then walked here x 450 with no desats or sob,  Then responded "well, I'm only sob in afternoons after I work all day", continues with sensation of too much throat drainage but no excess mucus, no following recs for ppi or H1 or H2 as per last set of written recs.  Sleeping ok without nocturnal  or early am exac of resp c/o's or need for noct saba. rec It is very unlikely that copd is causing your  breathing limitation - work on pacing yourself with activity  Add pepcid 20 mg one at bedtime for at least a month in addition to the prilosec 40mg  30 min before before first meal - do this for a full month  DATE OF ADMISSION: 04/06/2011  DATE OF DISCHARGE: 04/12/2011 Dx ?  boop disharge on 02 1lpm   04/27/2011 ov/[name] cc sob x 132ft but better since discharge but now on 02 1lpm.  No purulent sputum. Very confused with meds  Pt denies any significant sore throat, dysphagia, itching, sneezing,  nasal congestion or excess/ purulent secretions,  fever, chills, sweats, unintended wt loss, pleuritic or exertional cp, hempoptysis, orthopnea pnd or leg swelling.    Also denies any obvious fluctuation of symptoms with weather or environmental changes or other aggravating or alleviating factors.     Past Medical History:  Hyperlipidemia  Hypertension  - Try off ACE August 12, 2009 for upper airway cough  Cough...................................................................................Marland KitchenWert  - Sinus ct   December 18 2009 > Minor mucosal thickening at the right frontoethmoidal recess,  otherwise negative sinuses. COPD  - PFT's December 15, 2009 FEV1 2.30 (70%) ratio 63 no better with B2 and DLC0 18.6 (73%) corrects to 106%  - Nl walking sats x 3 laps 02/17/2011  -Recruited for copd  study 02/2011 Diabetes mellitus, type II  degenerative joint disease  Depression  Benign prostatic hypertrophy  Low back pain           Review of Systems     Objective:   Physical Exam      wt 217 August 12, 2009 > 224 September 21, 2009 > 225 November 03, 2009 > 219 02/17/2011 > 04/27/2011  213 amb obese wm nad  HEENT: full set of dentures with nl turbinate. Mucoid pnds. Nl external ear canals without cough reflex  NECK : without JVD/Nodes/TM/ nl carotid upstrokes bilaterally  LUNGS: no acc muscle use, clear to A and P bilaterally without cough on insp or exp maneuvers  CV: RRR no s3 or murmur or  increase in P2, no edema  ABD: soft and quite protuberant, obese nontender with nl excursion in the supine position. No bruits or organomegaly, bowel sounds nl  MS: warm without deformities, calf tenderness, cyanosis or clubbing   04/27/11 cxr Improving patchy airspace opacities bilaterally.    Assessment & Plan:

## 2011-04-28 ENCOUNTER — Telehealth: Payer: Self-pay | Admitting: Internal Medicine

## 2011-04-28 NOTE — Telephone Encounter (Signed)
Pt advised of CXR results per append. [place] Bing, CMA

## 2011-04-29 ENCOUNTER — Encounter: Payer: Self-pay | Admitting: Internal Medicine

## 2011-04-30 ENCOUNTER — Encounter: Payer: Self-pay | Admitting: Internal Medicine

## 2011-04-30 DIAGNOSIS — J9610 Chronic respiratory failure, unspecified whether with hypoxia or hypercapnia: Secondary | ICD-10-CM | POA: Insufficient documentation

## 2011-04-30 NOTE — Assessment & Plan Note (Signed)
Adequate control on present rx, reviewed - may well be able to wean off 02 as infiltrates melt away on prednisone

## 2011-04-30 NOTE — Assessment & Plan Note (Signed)
Clinical dx only, improving on pred and 02  The goal with a chronic steroid dependent illness is always arriving at the lowest effective dose that controls the disease/symptoms and not accepting a set "formula" which is based on statistics or guidelines that don't always take into account patient  variability or the natural hx of the dz in every individual patient, which may well vary over time.  For now therefore I recommend the patient maintain  A floor of 10 mg per day

## 2011-05-06 NOTE — H&P (Signed)
[name], [name] NO.:  [id number]  MEDICAL RECORD NO.:  [id number]  LOCATION:  [facility]                         FACILITY:  [facility]  PHYSICIAN:  Orvan Falconer, MD           DATE OF BIRTH:  1941/05/04  DATE OF ADMISSION:  04/06/2011 DATE OF DISCHARGE:                             HISTORY & PHYSICAL   PRIMARY CARE PHYSICIAN:  Ricard Dillon, MD  ADVANCE DIRECTIVE:  Full code.  REASON FOR ADMISSION:  Acute shortness of breath for 1 week, likely in congestive heart failure.  HISTORY OF PRESENT ILLNESS:  This is a 70 year old male with significant prior history of tobacco use (4 packs per day for over 20 years), hypertension, diabetes, GERD, neuropathy, but with no known coronary artery disease or having a diagnosis of COPD, presents to the emergency room in respiratory distress with history of 1 week of progressive dyspnea on exertion.  He stated he also has intermittent chest tightness.  He denies fever, chills or any productive cough.  He has no history of orthopnea or PND.  However, he naturally sleeps with the head of his bed up at least 6 inches due to his reflux.  There has been no complaint of pedal edema.  He has no distant travel or any ill contact. Evaluation in emergency room shows mild leukocytosis with [place] [address], hemoglobin 11.0.  He has an elevated D-dimer of 4.35 with subsequent CT pulmonary angiogram, though negative for PE, having diffuse alveolar and interstitial infiltrates with mild right pleural effusion.  His creatinine is 1.34 and serum sodium is 141.  His EKG shows sinus tachycardia with left bundle-branch block and no previous one to compare.  A chest x-ray was performed and showed interstitial and alveolar infiltrate.  His ABG is 7.39, 36, pAO2 of 54.  His cardiac markers were negative, but he had an elevated BNP of 800.  Hospitalist was asked to admit the patient because of his shortness of breath.  PAST MEDICAL HISTORY:   Hypertension, diabetes, GERD, neuropathy, prior pneumonia.  SOCIAL HISTORY:  He was a former drinker and former tobacco use.  He is married with several children alive and well.  ALLERGIES:  No known drug allergies.  CURRENT MEDICATIONS:  Acyclovir, amitriptyline, Norvasc, aspirin, Benicar, vitamin D, Flomax, glyburide, naproxen, Neurontin, oxycodone, Pepcid, Prilosec, Proscar, and Prozac.  Dosages unclear.  FAMILY HISTORY:  No family history of premature coronary artery disease.  REVIEW OF SYSTEMS:  Otherwise unremarkable.  PHYSICAL EXAMINATION:  VITAL SIGNS:  Blood pressure is 105/53, pulse of 100, respiratory rate of 24, temperature 98.1. GENERAL:  He is alert and oriented and conversing.  He is quite comfortable and not in any respiratory distress. HEENT:  Sclerae are nonicteric. NECK:  Supple.  No stridor.  No JVD. CARDIAC:  S1, S2 regular.  I did not hear any murmur, rub, or gallop. LUNGS:  Clear.  There is a little bit of crackles on his right base but no significant wheezes or any definite evidence of consolidation. ABDOMEN:  Soft, nondistended, nontender. EXTREMITIES:  No edema.  No calf tenderness. SKIN:  Warm and dry.  He had good distal  pulses bilaterally and has no carotid bruit. NEUROLOGIC AND PSYCHIATRIC:  Unremarkable as well.  OBJECTIVE FINDINGS:  As above.  White count 13400, hemoglobin 11.0, D- dimer 4.35.  Serum sodium 141, potassium 5.0, creatinine of 1.34.  EKG showed left bundle-branch block, in sinus tachycardia.  Chest x-ray and pulmonary angiogram showed diffuse bilateral alveolar and interstitial tissue infiltrate and small right pleural effusion.  IMPRESSION:  This is a 70 year old male with significant tobacco use, diabetes, hypertension, presents with progressive shortness of breath  over 1 week and much worse tonight.  I suspect that he does indeed have chronic obstructive pulmonary disease, however, and at this time he most likely has an acute  exacerbation of congestive heart failure.  He felt significantly better with intravenous Lasix.  Because of the leukocytosis, I agree with the antibiotic and I suspect Avelox is fine. I do think that his congestive heart failure will need to be further evaluated.  We will admit him to the step-down.  We will continue his diuresis.  I would like to add nitro paste to his regimen.  He should be on an aspirin a day.  We will rule him out with serial CPKs and troponins and get an echo of his heart.  He may need further workup to rule out ischemic cardiomyopathy.  His medications will be continued as soon as they are reconciled.  He is a full code and will be admitted to [facility].  He is now stable.     Orvan Falconer, MD     PL/MEDQ  D:  04/07/2011  T:  04/07/2011  Job:  [id number]  Electronically Signed by Orvan Falconer  on 05/06/2011 12:27:54 AM

## 2011-05-12 ENCOUNTER — Telehealth: Payer: Self-pay | Admitting: Internal Medicine

## 2011-05-12 MED ORDER — PREDNISONE 20 MG PO TABS: ORAL_TABLET | ORAL | Status: DC

## 2011-05-12 NOTE — Telephone Encounter (Signed)
[id number].  Need to know what pharmacy pt wants this rx called into.

## 2011-05-12 NOTE — Telephone Encounter (Signed)
Pt returned call.  Pt verifies he is taking 1/2 of a 20mg  tab once daily.  Requests this be sent to [facility] in [place] and has been to his pharmacy x3 today. Per 7.18.12 ov note, pt was to reduce prednisone to this dose until appt with TP on 8.7.12.  rx telephoned to [facility] in [place].  Pt is aware.

## 2011-05-13 ENCOUNTER — Encounter: Payer: Self-pay | Admitting: Internal Medicine

## 2011-05-17 ENCOUNTER — Ambulatory Visit (INDEPENDENT_AMBULATORY_CARE_PROVIDER_SITE_OTHER): Payer: Medicare HMO | Admitting: Adult Health

## 2011-05-17 ENCOUNTER — Encounter: Payer: Self-pay | Admitting: Adult Health

## 2011-05-17 DIAGNOSIS — J8489 Other specified interstitial pulmonary diseases: Secondary | ICD-10-CM

## 2011-05-17 DIAGNOSIS — J84116 Cryptogenic organizing pneumonia: Secondary | ICD-10-CM

## 2011-05-17 NOTE — Patient Instructions (Signed)
Follow up med calendar closely and bring to each visit.  Hold prednisone at 20mg  1/2 daily  follow up Dr. Melvyn Novas  In 2 weeks and As needed   Please contact office for sooner follow up if symptoms do not improve or worsen or seek emergency care

## 2011-05-17 NOTE — Progress Notes (Signed)
Subjective:    Patient ID: [name], male    DOB: 08-22-1941, 70 y.o.   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 rec d/c ace and fish oil and rx gerd.   September 21, 2009 1 month followup with cxr. Pt states that he still feels like he has chest congestion. Feels like he has to clear his throat frequently. He states that breathing is a little improved since last seen but much better than baseline.   November 03, 2009 denies doe, cough better to his satisfaction but still clearing throat, using tic tacs all the time although on list of diet restrictions given at last ov. rec no mint   December 15, 2009 cc 6 wk followup with PFT's. Pt c/o chest congestion and occ dry cough. Pt states that his breathing is about the same- no better or worse. no excess mucus even in am. Imp UACS not limited by copd rec  1) As long as having any respiratory issues (including drainage and congestion) you should prilosec Take one 30-60 min before first and last meals of the day and pepcid 20 mg one at bedtime  2) See Patient Care Coordinator before leaving for sinus ct  > neg 3) GERD (REFLUX) diet  02/17/2011 ov/Wert cc doe x 100 ft "every time I try" x one year, never changes  ? Never?  No never, then walked here x 450 with no desats or sob,  Then responded "well, I'm only sob in afternoons after I work all day", continues with sensation of too much throat drainage but no excess mucus, no following recs for ppi or H1 or H2 as per last set of written recs.  Sleeping ok without nocturnal  or early am exac of resp c/o's or need for noct saba. rec It is very unlikely that copd is causing your  breathing limitation - work on pacing yourself with activity  Add pepcid 20 mg one at bedtime for at least a month in addition to the prilosec 40mg  30 min before before first meal - do this for a full month  DATE OF ADMISSION: 04/06/2011  DATE OF DISCHARGE: 04/12/2011 Dx ?  boop disharge on 02 1lpm Hyperkalemia >ARB and NSAIDS stopped  ESR ~98   04/27/2011 ov/Wert cc sob x 179ft but better since discharge but now on 02 1lpm.  No purulent sputum. Very confused with meds  >>taper pred to 10mg  daily   05/17/2011 follow up  And med review  Returns for follow up and med review. We reviewed all his meds and organized them into a med calendar with pt. Education It appears he is taking meds as indicated.   Doing well since last ov. With no flare in cough or dyspnea on low steroids. Currently on 10mg  of prednisone.  Currently using O2 with activity and As needed.  CXR last ov with decreased infiltrates.     Past Medical History:  Hyperlipidemia  Hypertension  - Try off ACE August 12, 2009 for upper airway cough  Cough...................................................................................Marland KitchenWert  - Sinus ct   December 18 2009 > Minor mucosal thickening at the right frontoethmoidal recess,  otherwise negative  sinuses. COPD  - PFT's December 15, 2009 FEV1 2.30 (70%) ratio 63 no better with B2 and DLC0 18.6 (73%) corrects to 106%  - Nl walking sats x 3 laps 02/17/2011  -Recruited for copd study 02/2011 Diabetes mellitus, type II  degenerative joint disease  Depression  Benign prostatic hypertrophy  Low back pain  Hyperkalemia 03/2011 >ARB and NSAIDS stopped.  Complex med regimen >med calendar 05/17/2011     Review of Systems Constitutional:   No  weight loss, night sweats,  Fevers, chills, fatigue, or  lassitude.  HEENT:   No headaches,  Difficulty swallowing,  Tooth/dental problems, or  Sore throat,                No sneezing, itching, ear ache, nasal congestion, post nasal drip,    CV:  No chest pain,  Orthopnea, PND, swelling in lower extremities, anasarca, dizziness, palpitations, syncope.   GI  No heartburn, indigestion, abdominal pain, nausea, vomiting, diarrhea, change in bowel habits, loss of appetite, bloody stools.   Resp:  No coughing up of blood.     No chest wall deformity  Skin: no rash or lesions.  GU: no dysuria, change in color of urine, no urgency or frequency.  No flank pain, no hematuria   MS:  No joint pain or swelling.  No decreased range of motion.  No back pain.  Psych:  No change in mood or affect. No depression or anxiety.  No memory loss.         Objective:   Physical Exam      wt 217 August 12, 2009 > 224 September 21, 2009 > 225 November 03, 2009 > 219 02/17/2011 > 04/27/2011  213 >218 05/17/2011  amb obese wm nad  HEENT: full set of dentures with nl turbinate. Nl external ear canals without cough reflex  NECK : without JVD/Nodes/TM/ nl carotid upstrokes bilaterally  LUNGS: no acc muscle use, clear to A and P bilaterally without cough on insp or exp maneuvers  CV: RRR no s3 or murmur or increase in P2, no edema  ABD: soft and quite protuberant, obese nontender with nl excursion in the supine position. No bruits or organomegaly, bowel sounds nl  MS: warm without deformities, calf tenderness, cyanosis or clubbing        Assessment & Plan:

## 2011-05-17 NOTE — Assessment & Plan Note (Addendum)
Improving with decreased cough and dyspnea.  Pt to return for PFT next week.  Follow up in 2 weeks , will need repeat ESR at that time Patient's medications were reviewed today and patient education was given. Computerized medication calendar was adjusted/completed  Plan;  Follow up med calendar closely and bring to each visit.  Hold prednisone at 20mg  1/2 daily  follow up Dr. Melvyn Novas  In 2 weeks and As needed   Please contact office for sooner follow up if symptoms do not improve or worsen or seek emergency care

## 2011-05-19 ENCOUNTER — Other Ambulatory Visit: Payer: Self-pay | Admitting: *Deleted

## 2011-05-19 DIAGNOSIS — K219 Gastro-esophageal reflux disease without esophagitis: Secondary | ICD-10-CM

## 2011-05-19 NOTE — Progress Notes (Signed)
8.7.12 med calendar update.

## 2011-05-24 ENCOUNTER — Other Ambulatory Visit (INDEPENDENT_AMBULATORY_CARE_PROVIDER_SITE_OTHER): Payer: Medicare HMO

## 2011-05-24 ENCOUNTER — Other Ambulatory Visit: Payer: Medicare HMO

## 2011-05-24 ENCOUNTER — Ambulatory Visit (INDEPENDENT_AMBULATORY_CARE_PROVIDER_SITE_OTHER): Payer: Self-pay | Admitting: Internal Medicine

## 2011-05-24 DIAGNOSIS — J449 Chronic obstructive pulmonary disease, unspecified: Secondary | ICD-10-CM

## 2011-05-24 DIAGNOSIS — I10 Essential (primary) hypertension: Secondary | ICD-10-CM

## 2011-05-24 LAB — CBC WITH DIFFERENTIAL/PLATELET
Basophils Relative: 0.4 % (ref 0.0–3.0)
Eosinophils Relative: 1 % (ref 0.0–5.0)
HCT: 35.6 % — ABNORMAL LOW (ref 39.0–52.0)
Lymphs Abs: 3.5 10*3/uL (ref 0.7–4.0)
MCV: 93 fl (ref 78.0–100.0)
Monocytes Absolute: 0.6 10*3/uL (ref 0.1–1.0)
Platelets: 168 10*3/uL (ref 150.0–400.0)
WBC: 8.8 10*3/uL (ref 4.5–10.5)

## 2011-05-24 LAB — BASIC METABOLIC PANEL
BUN: 16 mg/dL (ref 6–23)
CO2: 29 mEq/L (ref 19–32)
Chloride: 105 mEq/L (ref 96–112)
Potassium: 4.5 mEq/L (ref 3.5–5.1)

## 2011-05-24 NOTE — Progress Notes (Signed)
PFT done today. 

## 2011-06-01 ENCOUNTER — Other Ambulatory Visit: Payer: Medicare HMO

## 2011-06-02 ENCOUNTER — Encounter: Payer: Self-pay | Admitting: Internal Medicine

## 2011-06-02 ENCOUNTER — Other Ambulatory Visit (INDEPENDENT_AMBULATORY_CARE_PROVIDER_SITE_OTHER): Payer: Medicare HMO

## 2011-06-02 ENCOUNTER — Ambulatory Visit (INDEPENDENT_AMBULATORY_CARE_PROVIDER_SITE_OTHER): Payer: Medicare HMO | Admitting: Internal Medicine

## 2011-06-02 ENCOUNTER — Ambulatory Visit (INDEPENDENT_AMBULATORY_CARE_PROVIDER_SITE_OTHER)
Admission: RE | Admit: 2011-06-02 | Discharge: 2011-06-02 | Disposition: A | Discharge status: Home / Self Care | Payer: Medicare HMO | Source: Ambulatory Visit | Attending: Internal Medicine | Admitting: Internal Medicine

## 2011-06-02 VITALS — BP 122/70 | HR 100 | Temp 97.7°F | Ht 72.0 in | Wt 218.6 lb

## 2011-06-02 DIAGNOSIS — J84116 Cryptogenic organizing pneumonia: Secondary | ICD-10-CM

## 2011-06-02 DIAGNOSIS — J8489 Other specified interstitial pulmonary diseases: Secondary | ICD-10-CM

## 2011-06-02 DIAGNOSIS — J9610 Chronic respiratory failure, unspecified whether with hypoxia or hypercapnia: Secondary | ICD-10-CM

## 2011-06-02 LAB — SEDIMENTATION RATE: Sed Rate: 12 mm/hr (ref 0–22)

## 2011-06-02 MED ORDER — PREDNISONE (PAK) 10 MG PO TABS: ORAL_TABLET | ORAL | Status: DC

## 2011-06-02 NOTE — Patient Instructions (Addendum)
See calendar for specific medication instructions and bring it back for each and every office visit for every healthcare provider you see.  Without it,  you may not receive the best quality medical care that we feel you deserve.  You will note that the calendar groups together  your maintenance  medications that are timed at particular times of the day.  Think of this as your checklist for what your doctor has instructed you to do until your next evaluation to see what benefit  there is  to staying on a consistent group of medications intended to keep you well.  The other group at the bottom is entirely up to you to use as you see fit  for specific symptoms that may arise between visits that require you to treat them on an as needed basis.  Think of this as your action plan or "what if" list.   Separating the top medications from the bottom group is fundamental to providing you adequate care going forward.    Try prednisone to 10mg  every other day (even days) - if get worse go back to 10 mg daily  Please schedule a follow up office visit in 6 weeks, call sooner if needed   Late add Needs walking sats and ? D/c 02 completely next ov

## 2011-06-02 NOTE — Progress Notes (Signed)
Subjective:    Patient ID: [name], male    DOB: [birth year], 70 y.o.   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 rec d/c ace and fish oil and rx gerd.   September 21, 2009 1 month followup with cxr. Pt states that he still feels like he has chest congestion. Feels like he has to clear his throat frequently. He states that breathing is a little improved since last seen but much better than baseline.   November 03, 2009 denies doe, cough better to his satisfaction but still clearing throat, using tic tacs all the time although on list of diet restrictions given at last ov. rec no mint   December 15, 2009 cc 6 wk followup with PFT's. Pt c/o chest congestion and occ dry cough. Pt states that his breathing is about the same- no better or worse. no excess mucus even in am. Imp UACS not limited by copd rec  1) As long as having any respiratory issues (including drainage and congestion) you should prilosec Take one 30-60 min before first and last meals of the day and pepcid 20 mg one at bedtime  2) See Patient Care Coordinator before leaving for sinus ct  > neg 3) GERD (REFLUX) diet  02/17/2011 ov/[name] cc doe x 100 ft "every time I try" x one year, never changes  ? Never?  No never, then walked here x 450 with no desats or sob,  Then responded "well, I'm only sob in afternoons after I work all day", continues with sensation of too much throat drainage but no excess mucus, no following recs for ppi or H1 or H2 as per last set of written recs.  Sleeping ok without nocturnal  or early am exac of resp c/o's or need for noct saba. rec It is very unlikely that copd is causing your  breathing limitation - work on pacing yourself with activity  Add pepcid 20 mg one at bedtime for at least a month in addition to the prilosec 40mg  30 min before before first meal - do this for a full month  DATE OF ADMISSION: 04/06/2011  DATE OF DISCHARGE: 04/12/2011 Dx ?  boop disharge on 02 1lpm Hyperkalemia >ARB and NSAIDS stopped  ESR ~98   04/27/2011 ov/[name] cc sob x 126ft but better since discharge but now on 02 1lpm.  No purulent sputum. Very confused with meds  >>taper pred to 10mg  daily   05/17/2011 ov/ NP follow up  And med review  Returns for follow up and med review.  Currently on 10mg  of prednisone.  Currently using O2 with activity and As needed.  CXR last ov with decreased infiltrates.  rec Follow up med calendar closely and bring to each visit.  Hold prednisone at 20mg  1/2 daily    06/02/2011 f/u ov/[name] cc doe  continues to improve no longer needing 02 and pred at 10 mg per day with no sign cough. Not aerobic but no limited from desired adls  Pt denies any significant sore throat, dysphagia, itching, sneezing,  nasal congestion or excess/ purulent secretions,  fever, chills, sweats, unintended wt loss, pleuritic or exertional cp, hempoptysis,  orthopnea pnd or leg swelling.    Also denies any obvious fluctuation of symptoms with weather or environmental changes or other aggravating or alleviating factors.        Past Medical History:  Hyperlipidemia  Hypertension  - Try off ACE August 12, 2009 for upper airway cough  Cough...................................................................................Marland KitchenWert  - Sinus ct   December 18 2009 > Minor mucosal thickening at the right frontoethmoidal recess,  otherwise negative sinuses. COPD  - PFT's December 15, 2009 FEV1 2.30 (70%) ratio 63 no better with B2 and DLC0 18.6 (73%) corrects to 106%  - Nl walking sats x 3 laps 02/17/2011  -Recruited for copd study 02/2011 Diabetes mellitus, type II  degenerative joint disease    Depression  Benign prostatic hypertrophy  Low back pain  Hyperkalemia 03/2011 >ARB and NSAIDS stopped.  Complex med regimen >med calendar 05/17/2011      .         Objective:   Physical Exam      wt 217 August 12, 2009 > 224 September 21, 2009 > 225 November 03, 2009 > 219 02/17/2011 > 04/27/2011  213 >218 05/17/2011 > 06/02/2011  218 amb obese wm nad  HEENT: full set of dentures with nl turbinate. Nl external ear canals without cough reflex  NECK : without JVD/Nodes/TM/ nl carotid upstrokes bilaterally  LUNGS: no acc muscle use, clear to A and P bilaterally without cough on insp or exp maneuvers  CV: RRR no s3 or murmur or increase in P2, no edema  ABD: soft and quite protuberant, obese nontender with nl excursion in the supine position. No bruits or organomegaly, bowel sounds nl  MS: warm without deformities, calf tenderness, cyanosis or clubbing     CXR  06/02/2011 :  Further improvement in patchy airspace and interstitial opacities in both lungs.     Assessment & Plan:

## 2011-06-02 NOTE — Assessment & Plan Note (Signed)
The goal with a chronic steroid dependent illness is always arriving at the lowest effective dose that controls the disease/symptoms and not accepting a set "formula" which is based on statistics or guidelines that don't always take into account patient  variability or the natural hx of the dz in every individual patient, which may well vary over time.  For now therefore I recommend the patient maintain  pred at 10 mg qod

## 2011-06-03 ENCOUNTER — Telehealth: Payer: Self-pay | Admitting: Internal Medicine

## 2011-06-03 NOTE — Telephone Encounter (Signed)
Notes Recorded by Christinia Gully, MD on 06/02/2011 at 3:59 PM Call pt: Reviewed cxr and no acute change so no change in recommendations made at ov - continues to improved as expected   [id number]

## 2011-06-06 NOTE — Telephone Encounter (Signed)
[email or web address]. Jennifer Castillo, CMA  

## 2011-06-07 NOTE — Telephone Encounter (Signed)
Pt aware. Jennifer Castillo, CMA  

## 2011-06-08 ENCOUNTER — Ambulatory Visit (INDEPENDENT_AMBULATORY_CARE_PROVIDER_SITE_OTHER): Payer: Medicare HMO | Admitting: Internal Medicine

## 2011-06-08 ENCOUNTER — Other Ambulatory Visit: Payer: Self-pay | Admitting: *Deleted

## 2011-06-08 ENCOUNTER — Encounter: Payer: Self-pay | Admitting: Internal Medicine

## 2011-06-08 VITALS — BP 130/72 | HR 112 | Temp 98.2°F | Resp 18 | Ht 73.0 in | Wt 220.0 lb

## 2011-06-08 DIAGNOSIS — J449 Chronic obstructive pulmonary disease, unspecified: Secondary | ICD-10-CM

## 2011-06-08 DIAGNOSIS — G4700 Insomnia, unspecified: Secondary | ICD-10-CM

## 2011-06-08 DIAGNOSIS — I10 Essential (primary) hypertension: Secondary | ICD-10-CM

## 2011-06-08 DIAGNOSIS — M549 Dorsalgia, unspecified: Secondary | ICD-10-CM

## 2011-06-08 DIAGNOSIS — G8929 Other chronic pain: Secondary | ICD-10-CM

## 2011-06-08 DIAGNOSIS — M1990 Unspecified osteoarthritis, unspecified site: Secondary | ICD-10-CM

## 2011-06-08 DIAGNOSIS — E119 Type 2 diabetes mellitus without complications: Secondary | ICD-10-CM

## 2011-06-08 MED ORDER — OXYCODONE-ACETAMINOPHEN 5-325 MG PO TABS: 1.0000 | ORAL_TABLET | Freq: Four times a day (QID) | ORAL | Status: DC | PRN

## 2011-06-08 MED ORDER — NAPROXEN 500 MG PO TBEC: 500.0000 mg | DELAYED_RELEASE_TABLET | Freq: Two times a day (BID) | ORAL | Status: DC

## 2011-06-08 MED ORDER — PREDNISONE 5 MG PO TABS: 5.0000 mg | ORAL_TABLET | ORAL | Status: AC

## 2011-06-08 NOTE — Patient Instructions (Signed)
Return to the office in 3 months for a Medicare wellness examination we will draw blood work in advance because your [employer]

## 2011-06-08 NOTE — Progress Notes (Signed)
Subjective:    Patient ID: [name], male    DOB: 09-Feb-1941, 70 y.o.   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  He presents today for followup of his diabetes an A1c taken in the hospital was 6.1 he had been on prednisone recently tapered so repeating the A1c today would show an elevated A1c reflective of the prednisone and not of overall diabetic control therefore we will hold off on checking a A1c until his next visit in 3 months.  Patient's blood pressure is stable a CBC taken prior to this office visit shows recovering anemia his hemoglobin is up from 10.3-11.8 his white count has returned to normal 8.8 from a high of 17.0 during his infectious period.     Review of Systems  Constitutional: Negative for fever and fatigue.  HENT: Negative for hearing loss, congestion, neck pain and postnasal drip.   Eyes: Negative for discharge, redness and visual disturbance.  Respiratory: Positive for cough and chest tightness. Negative for shortness of breath and wheezing.   Cardiovascular: Negative for leg swelling.  Gastrointestinal: Negative for abdominal pain, constipation and abdominal distention.  Genitourinary: Negative for urgency and frequency.  Musculoskeletal: Negative for joint swelling and arthralgias.  Skin: Negative for color change and rash.  Neurological: Negative for weakness and light-headedness.  Hematological: Negative for adenopathy.  Psychiatric/Behavioral: Negative for behavioral problems.   Past Medical History  Diagnosis Date  . Hyperlipidemia   . Hypertension August 12, 2009    try off ACE for upper airway cough  . Coughing December 18, 2009    Sinus CT : Minor mucosal thickening at the Right Frontoethmoidal recess, otherwise  negative sinuses  . COPD (chronic obstructive pulmonary disease) December 15, 2009    FEV1 2.30 (70%) ratio 63 no better with B2 and DLCO 18/6 (73%) corrects to 106%  .  Diabetes mellitus type II   . Degenerative joint disease   . Depression   . Benign prostatic hypertrophy   . Low back pain    Past Surgical History  Procedure Date  . Dvt 1980    reports that he quit smoking about 26 years ago. His smoking use included Cigarettes. He has a 80 pack-year smoking history. He does not have any smokeless tobacco history on file. He reports that he does not drink alcohol or use illicit drugs. family history includes Cancer in his mother and Stroke in his father. No Known Allergies     Objective:   Physical Exam  Vitals reviewed. Constitutional: He appears well-developed and well-nourished.  HENT:  Head: Normocephalic and atraumatic.  Eyes: Conjunctivae are normal. Pupils are equal, round, and reactive to light.  Neck: Normal range of motion. Neck supple.  Cardiovascular: Normal rate and regular rhythm.   Pulmonary/Chest: Effort normal and breath sounds normal.  Abdominal: Soft. Bowel sounds are normal.          Assessment & Plan:  Patient is a refill of his pain medications.  He is tapering off the prednisone therefore we will do resume the Naprosyn.  He is warned to look for dark or tarry stools if he sees any resumption of dark or tarry stools she should stop the Naprosyn and contact our office.  Patient's blood sugars look like they are doing well because he is on prednisone we will wait until next visit to do his A1c he should be tapered off the medication at that point his breathing  has returned to baseline

## 2011-08-23 ENCOUNTER — Other Ambulatory Visit (INDEPENDENT_AMBULATORY_CARE_PROVIDER_SITE_OTHER): Payer: Medicare HMO

## 2011-08-23 DIAGNOSIS — Z0000 Encounter for general adult medical examination without abnormal findings: Secondary | ICD-10-CM

## 2011-08-23 DIAGNOSIS — Z79899 Other long term (current) drug therapy: Secondary | ICD-10-CM

## 2011-08-23 DIAGNOSIS — Z125 Encounter for screening for malignant neoplasm of prostate: Secondary | ICD-10-CM

## 2011-08-23 DIAGNOSIS — E785 Hyperlipidemia, unspecified: Secondary | ICD-10-CM

## 2011-08-23 DIAGNOSIS — I10 Essential (primary) hypertension: Secondary | ICD-10-CM

## 2011-08-23 LAB — PSA: PSA: 0.48 ng/mL (ref 0.10–4.00)

## 2011-08-23 LAB — POCT URINALYSIS DIPSTICK
Leukocytes, UA: NEGATIVE
Nitrite, UA: NEGATIVE
Protein, UA: NEGATIVE
Urobilinogen, UA: 2
pH, UA: 7.5

## 2011-08-23 LAB — MICROALBUMIN / CREATININE URINE RATIO
Creatinine,U: 93.1 mg/dL
Microalb Creat Ratio: 1.4 mg/g (ref 0.0–30.0)

## 2011-08-23 LAB — HEPATIC FUNCTION PANEL
ALT: 11 U/L (ref 0–53)
Alkaline Phosphatase: 82 U/L (ref 39–117)
Bilirubin, Direct: 0.1 mg/dL (ref 0.0–0.3)
Total Protein: 5.9 g/dL — ABNORMAL LOW (ref 6.0–8.3)

## 2011-08-23 LAB — LIPID PANEL
HDL: 39.7 mg/dL (ref >39.00)
LDL Cholesterol: 121 mg/dL — ABNORMAL HIGH (ref 0–99)
Total CHOL/HDL Ratio: 5

## 2011-08-23 LAB — BASIC METABOLIC PANEL
CO2: 32 mEq/L (ref 19–32)
Calcium: 8.9 mg/dL (ref 8.4–10.5)
Chloride: 106 mEq/L (ref 96–112)
Sodium: 144 mEq/L (ref 135–145)

## 2011-08-23 LAB — CBC WITH DIFFERENTIAL/PLATELET
Basophils Absolute: 0 10*3/uL (ref 0.0–0.1)
Basophils Relative: 0.5 % (ref 0.0–3.0)
Eosinophils Absolute: 0.1 10*3/uL (ref 0.0–0.7)
Lymphocytes Relative: 33.8 % (ref 12.0–46.0)
MCHC: 33.3 g/dL (ref 30.0–36.0)
Neutrophils Relative %: 55.3 % (ref 43.0–77.0)
Platelets: 141 10*3/uL — ABNORMAL LOW (ref 150.0–400.0)
RBC: 3.56 Mil/uL — ABNORMAL LOW (ref 4.22–5.81)

## 2011-08-24 LAB — VITAMIN D 25 HYDROXY (VIT D DEFICIENCY, FRACTURES): Vit D, 25-Hydroxy: 39 ng/mL (ref 30–89)

## 2011-09-07 ENCOUNTER — Encounter: Payer: Self-pay | Admitting: Internal Medicine

## 2011-09-07 ENCOUNTER — Ambulatory Visit (INDEPENDENT_AMBULATORY_CARE_PROVIDER_SITE_OTHER): Payer: Medicare HMO | Admitting: Internal Medicine

## 2011-09-07 VITALS — BP 152/90 | HR 88 | Temp 98.2°F | Resp 16 | Ht 73.0 in | Wt 222.0 lb

## 2011-09-07 DIAGNOSIS — I10 Essential (primary) hypertension: Secondary | ICD-10-CM

## 2011-09-07 DIAGNOSIS — E785 Hyperlipidemia, unspecified: Secondary | ICD-10-CM

## 2011-09-07 DIAGNOSIS — Z0000 Encounter for general adult medical examination without abnormal findings: Secondary | ICD-10-CM

## 2011-09-07 DIAGNOSIS — Z23 Encounter for immunization: Secondary | ICD-10-CM

## 2011-09-07 DIAGNOSIS — G8929 Other chronic pain: Secondary | ICD-10-CM

## 2011-09-07 DIAGNOSIS — M549 Dorsalgia, unspecified: Secondary | ICD-10-CM

## 2011-09-07 MED ORDER — OXYCODONE-ACETAMINOPHEN 5-325 MG PO TABS: 1.0000 | ORAL_TABLET | Freq: Four times a day (QID) | ORAL | Status: DC | PRN

## 2011-09-07 NOTE — Progress Notes (Signed)
Subjective:    Patient ID: [name], male    DOB: 01-Dec-1940, 70 y.o.   MRN: [id number]  HPIcpx Patient presents for complete physical examination.  He is followed for hyperlipidemia hypertension adult-onset diabetes.  Recently his A1c is increased as well as his weight he has difficulty with syncope with significant omental weight.  He has low back pain with difficulty with exercising he has  moderate COPD     Review of Systems  Constitutional: Negative for fever and fatigue.  HENT: Negative for hearing loss, congestion, neck pain and postnasal drip.   Eyes: Negative for discharge, redness and visual disturbance.  Respiratory: Negative for cough, shortness of breath and wheezing.   Cardiovascular: Negative for leg swelling.  Gastrointestinal: Negative for abdominal pain, constipation and abdominal distention.  Genitourinary: Negative for urgency and frequency.  Musculoskeletal: Negative for joint swelling and arthralgias.  Skin: Negative for color change and rash.  Neurological: Negative for weakness and light-headedness.  Hematological: Negative for adenopathy.  Psychiatric/Behavioral: Negative for behavioral problems.   Past Medical History  Diagnosis Date  . Hyperlipidemia   . Hypertension August 12, 2009    try off ACE for upper airway cough  . Coughing December 18, 2009    Sinus CT : Minor mucosal thickening at the Right Frontoethmoidal recess, otherwise  negative sinuses  . COPD (chronic obstructive pulmonary disease) December 15, 2009    FEV1 2.30 (70%) ratio 63 no better with B2 and DLCO 18/6 (73%) corrects to 106%  . Diabetes mellitus type II   . Degenerative joint disease   . Depression   . Benign prostatic hypertrophy   . Low back pain     History   Social History  . Marital Status: Married    Spouse Name: N/A    Number of Children: N/A  . Years of Education: N/A   Occupational History  . 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 Smoking status: Former Smoker -- 4.0 packs/day for 20 years    Types: Cigarettes    Quit date: 09/18/1984  . Smokeless tobacco: Not on file   Comment: quit in 1980  . Alcohol Use: No  . Drug Use: No  . Sexually Active: Yes   Other Topics Concern  . Not on file   Social History Narrative  . No narrative on file    Past Surgical History  Procedure Date  . Dvt 1980    Family History  Problem Relation Age of Onset  . Cancer Mother     melanoma  . Stroke Father     No Known Allergies  Current Outpatient Prescriptions on File Prior to Visit  Medication Sig Dispense Refill  . acyclovir (ZOVIRAX) 400 MG tablet Take 400 mg by mouth 2 (two) times daily.        Marland Kitchen amitriptyline (ELAVIL) 25 MG tablet Take 1 tablet (25 mg total) by mouth at bedtime.  90 tablet  3  . amLODipine (NORVASC) 5 MG tablet Take 1 tablet (5 mg total) by mouth daily.  90 tablet  3  . aspirin 81 MG tablet Take 81 mg by mouth daily.        . Cholecalciferol 1000 UNITS capsule Take 1 capsule (1,000 Units total) by mouth 2 (two) times daily.      . famotidine (PEPCID) 20 MG tablet One at bedtime      . finasteride (PROSCAR) 5 MG tablet Take 1 tablet (5  mg total) by mouth daily.  90 tablet  3  . FLUoxetine (PROZAC) 20 MG tablet Take 1 tablet (20 mg total) by mouth every morning.  90 tablet  3  . gabapentin (NEURONTIN) 300 MG capsule Take 1 capsule (300 mg total) by mouth 2 (two) times daily.  180 capsule  3  . glyBURIDE-metformin (GLUCOVANCE) 2.5-500 MG per tablet Take 1 tablet by mouth 2 (two) times daily.  180 tablet  3  . Multiple Vitamin (MULTIVITAMIN) capsule Take 1 capsule by mouth daily.        . naproxen (EC-NAPROSYN) 500 MG EC tablet Take 1 tablet (500 mg total) by mouth 2 (two) times daily with a meal.      . omeprazole (PRILOSEC) 20 MG capsule Take one 30-60 min before first meal of the day      . oxyCODONE-acetaminophen (PERCOCET) 5-325 MG per tablet Take 1 tablet by mouth every 6 (six) hours as  needed.  120 tablet  0  . Tamsulosin HCl (FLOMAX) 0.4 MG CAPS Take 1 capsule (0.4 mg total) by mouth daily.  90 capsule  3    BP 152/90  Pulse 88  Temp 98.2 F (36.8 C)  Resp 16  Ht 6\' 1"  (1.854 m)  Wt 222 lb (100.699 kg)  BMI 29.29 kg/m2       Objective:   Physical Exam  Nursing note reviewed. Constitutional: He is oriented to person, place, and time. He appears well-developed and well-nourished.  HENT:  Head: Normocephalic and atraumatic.  Eyes: Conjunctivae are normal. Pupils are equal, round, and reactive to light.  Neck: Normal range of motion. Neck supple.  Cardiovascular: Normal rate and regular rhythm.   Pulmonary/Chest: Effort normal and breath sounds normal.  Abdominal: Soft. Bowel sounds are normal.  Genitourinary: Rectum normal and prostate normal.  Musculoskeletal: He exhibits edema and tenderness.  Neurological: He is alert and oriented to person, place, and time.  Skin: Skin is warm and dry.          Assessment & Plan:    Patient presents for yearly preventative medicine examination.   all immunizations and health maintenance protocols were reviewed with the patient and they are up to date with these protocols.   screening laboratory values were reviewed with the patient including screening of hyperlipidemia PSA renal function and hepatic function.   There medications past medical history social history problem list and allergies were reviewed in detail.   Goals were established with regard to weight loss exercise diet in compliance with medications   Patient's diabetes was addressed he has not been following a good diet we talked about weight loss and exercise.  Patient's blood pressure is moderately well controlled but are goal less than 140/80 we discussed weight loss exercise and compliance with medications.  Patient's COPD is stable.  Patient's lipid panel is improved in terms of his HDL total cholesterol slightly elevated LDL cholesterol is  decreased

## 2011-09-07 NOTE — Patient Instructions (Signed)
A balanced plate has protein fast carbohydrate and slow carbohydrate on it but only one from each category. An example would be that you could have a steak green beans and potato but she couldn't have bread and potatoes because that would be too fast carbohydrate .

## 2011-09-07 NOTE — Progress Notes (Signed)
Addended by: Ricard Dillon MD E on: 09/07/2011 03:11 PM   Modules accepted: Orders

## 2011-11-16 NOTE — Progress Notes (Signed)
  Subjective:    Patient ID: [name], male    DOB: Jul 03, 1941, 71 y.o.   MRN: [id number]  HPI    Review of Systems     Objective:   Physical Exam        Assessment & Plan:

## 2011-11-24 ENCOUNTER — Other Ambulatory Visit: Payer: Self-pay | Admitting: *Deleted

## 2011-11-25 ENCOUNTER — Other Ambulatory Visit: Payer: Self-pay | Admitting: *Deleted

## 2011-11-25 MED ORDER — GLYBURIDE 5 MG PO TABS: 5.0000 mg | ORAL_TABLET | Freq: Two times a day (BID) | ORAL | Status: DC

## 2011-12-02 ENCOUNTER — Telehealth: Payer: Self-pay | Admitting: Family Medicine

## 2011-12-05 NOTE — Telephone Encounter (Signed)
error 

## 2011-12-06 ENCOUNTER — Encounter: Payer: Self-pay | Admitting: Internal Medicine

## 2011-12-06 ENCOUNTER — Ambulatory Visit (INDEPENDENT_AMBULATORY_CARE_PROVIDER_SITE_OTHER): Payer: Medicare HMO | Admitting: Internal Medicine

## 2011-12-06 DIAGNOSIS — M1710 Unilateral primary osteoarthritis, unspecified knee: Secondary | ICD-10-CM

## 2011-12-06 DIAGNOSIS — J449 Chronic obstructive pulmonary disease, unspecified: Secondary | ICD-10-CM

## 2011-12-06 DIAGNOSIS — I10 Essential (primary) hypertension: Secondary | ICD-10-CM

## 2011-12-06 DIAGNOSIS — D518 Other vitamin B12 deficiency anemias: Secondary | ICD-10-CM

## 2011-12-06 DIAGNOSIS — J8409 Other alveolar and parieto-alveolar conditions: Secondary | ICD-10-CM

## 2011-12-06 DIAGNOSIS — E119 Type 2 diabetes mellitus without complications: Secondary | ICD-10-CM

## 2011-12-06 DIAGNOSIS — D519 Vitamin B12 deficiency anemia, unspecified: Secondary | ICD-10-CM

## 2011-12-06 DIAGNOSIS — J8489 Other specified interstitial pulmonary diseases: Secondary | ICD-10-CM

## 2011-12-06 LAB — CBC WITH DIFFERENTIAL/PLATELET
Basophils Relative: 0.5 % (ref 0.0–3.0)
HCT: 33.8 % — ABNORMAL LOW (ref 39.0–52.0)
Hemoglobin: 11 g/dL — ABNORMAL LOW (ref 13.0–17.0)
Lymphocytes Relative: 30.5 % (ref 12.0–46.0)
Lymphs Abs: 2.2 10*3/uL (ref 0.7–4.0)
Monocytes Relative: 5.7 % (ref 3.0–12.0)
Neutro Abs: 4.4 10*3/uL (ref 1.4–7.7)
RBC: 3.8 Mil/uL — ABNORMAL LOW (ref 4.22–5.81)

## 2011-12-06 MED ORDER — CYANOCOBALAMIN 1000 MCG/ML IJ SOLN
1000.0000 ug | Freq: Once | INTRAMUSCULAR | Status: AC
  Administered 2011-12-06: 1000 ug via INTRAMUSCULAR

## 2011-12-06 NOTE — Progress Notes (Signed)
Subjective:    Patient ID: [name], male    DOB: 1941/03/05, 71 y.o.   MRN: [id number]  HPI The patient is a 71 year old male who presents for followup of COPD hypertension and diabetes.  His blood pressure today is 140/80 his weight is stable at 222 pounds he states that his CBGs have been in range.  He has occasional shortness of breath and requires oxygen when he overexerts himself but generally he is doing without his oxygen during the day.   Review of Systems  Constitutional: Negative for fever and fatigue.  HENT: Negative for hearing loss, congestion, neck pain and postnasal drip.   Eyes: Negative for discharge, redness and visual disturbance.  Respiratory: Negative for cough, shortness of breath and wheezing.   Cardiovascular: Negative for leg swelling.  Gastrointestinal: Negative for abdominal pain, constipation and abdominal distention.  Genitourinary: Negative for urgency and frequency.  Musculoskeletal: Negative for joint swelling and arthralgias.  Skin: Negative for color change and rash.  Neurological: Negative for weakness and light-headedness.  Hematological: Negative for adenopathy.  Psychiatric/Behavioral: Negative for behavioral problems.   Past Medical History  Diagnosis Date  . Hyperlipidemia   . Hypertension August 12, 2009    try off ACE for upper airway cough  . Coughing December 18, 2009    Sinus CT : Minor mucosal thickening at the Right Frontoethmoidal recess, otherwise  negative sinuses  . COPD (chronic obstructive pulmonary disease) December 15, 2009    FEV1 2.30 (70%) ratio 63 no better with B2 and DLCO 18/6 (73%) corrects to 106%  . Diabetes mellitus type II   . Degenerative joint disease   . Depression   . Benign prostatic hypertrophy   . Low back pain     History   Social History  . Marital Status: Married    Spouse Name: N/A    Number of Children: N/A  . Years of Education: N/A   Occupational History  . retired     Armed forces operational officer work    Social History Main Topics  . Smoking status: Former Smoker -- 4.0 packs/day for 20 years    Types: Cigarettes    Quit date: 09/18/1984  . Smokeless tobacco: Not on file   Comment: quit in 1980  . Alcohol Use: No  . Drug Use: No  . Sexually Active: Yes   Other Topics Concern  . Not on file   Social History Narrative  . No narrative on file    Past Surgical History  Procedure Date  . Dvt 1980    Family History  Problem Relation Age of Onset  . Cancer Mother     melanoma  . Stroke Father     No Known Allergies  Current Outpatient Prescriptions on File Prior to Visit  Medication Sig Dispense Refill  . acyclovir (ZOVIRAX) 400 MG tablet Take 400 mg by mouth 2 (two) times daily.        Marland Kitchen amitriptyline (ELAVIL) 25 MG tablet Take 1 tablet (25 mg total) by mouth at bedtime.  90 tablet  3  . amLODipine (NORVASC) 5 MG tablet Take 1 tablet (5 mg total) by mouth daily.  90 tablet  3  . aspirin 81 MG tablet Take 81 mg by mouth daily.        . Cholecalciferol 1000 UNITS capsule Take 1 capsule (1,000 Units total) by mouth 2 (two) times daily.      . famotidine (PEPCID) 20 MG tablet One at bedtime      .  finasteride (PROSCAR) 5 MG tablet Take 1 tablet (5 mg total) by mouth daily.  90 tablet  3  . FLUoxetine (PROZAC) 20 MG tablet Take 1 tablet (20 mg total) by mouth every morning.  90 tablet  3  . gabapentin (NEURONTIN) 300 MG capsule Take 1 capsule (300 mg total) by mouth 2 (two) times daily.  180 capsule  3  . glyBURIDE (DIABETA) 5 MG tablet Take 1 tablet (5 mg total) by mouth 2 (two) times daily.  180 tablet  3  . Multiple Vitamin (MULTIVITAMIN) capsule Take 1 capsule by mouth daily.        . naproxen (EC-NAPROSYN) 500 MG EC tablet Take 1 tablet (500 mg total) by mouth 2 (two) times daily with a meal.      . omeprazole (PRILOSEC) 20 MG capsule Take one 30-60 min before first meal of the day      . oxyCODONE-acetaminophen (PERCOCET) 5-325 MG per tablet Take 1 tablet by mouth every 6  (six) hours as needed.  120 tablet  0  . Tamsulosin HCl (FLOMAX) 0.4 MG CAPS Take 1 capsule (0.4 mg total) by mouth daily.  90 capsule  3  . DISCONTD: oxyCODONE-acetaminophen (PERCOCET) 5-325 MG per tablet Take 1 tablet by mouth every 6 (six) hours as needed.  120 tablet  0    BP 140/80  Pulse 80  Temp 97.9 F (36.6 C)  Resp 16  Ht 6' (1.829 m)  Wt 222 lb (100.699 kg)  BMI 30.11 kg/m2       Objective:   Physical Exam  Nursing note and vitals reviewed. Constitutional: He appears well-developed and well-nourished.  HENT:  Head: Normocephalic and atraumatic.  Eyes: Conjunctivae are normal. Pupils are equal, round, and reactive to light.  Neck: Normal range of motion. Neck supple.  Cardiovascular: Normal rate and regular rhythm.   Pulmonary/Chest: Effort normal and breath sounds normal.  Abdominal: Soft. Bowel sounds are normal.          Assessment & Plan:  Patient's diabetes will be measured today by hemoglobin A1c.  Will measure a lipid and liver today.  Patient's blood pressure stable his current medications.  The patient's COPD appears to be stable with no evidence of wheezing or rails on his physical examination  The patient's labs with him a slight increase in his hemoglobin A1c shows his diabetes was not as well controlled this time he admits that he does have ice cream at night "we talked about using a lower calorie substitute for that

## 2011-12-06 NOTE — Progress Notes (Signed)
Addended by: Allyne Gee on: 12/06/2011 12:48 PM   Modules accepted: Orders

## 2011-12-06 NOTE — Patient Instructions (Signed)
.  avs

## 2011-12-12 ENCOUNTER — Telehealth: Payer: Self-pay | Admitting: Internal Medicine

## 2011-12-12 DIAGNOSIS — G8929 Other chronic pain: Secondary | ICD-10-CM

## 2011-12-12 DIAGNOSIS — M549 Dorsalgia, unspecified: Secondary | ICD-10-CM

## 2011-12-12 MED ORDER — OXYCODONE-ACETAMINOPHEN 5-325 MG PO TABS: 1.0000 | ORAL_TABLET | Freq: Four times a day (QID) | ORAL | Status: DC | PRN

## 2011-12-12 NOTE — Telephone Encounter (Signed)
Printed and waiting for signature. 

## 2011-12-12 NOTE — Telephone Encounter (Signed)
Patient came in requesting 3 rxs for his oxycodo/acetam 5-325 mg. Please call patient for pick up when available.

## 2011-12-13 ENCOUNTER — Other Ambulatory Visit: Payer: Self-pay | Admitting: *Deleted

## 2011-12-13 DIAGNOSIS — N400 Enlarged prostate without lower urinary tract symptoms: Secondary | ICD-10-CM

## 2011-12-13 MED ORDER — FINASTERIDE 5 MG PO TABS: 5.0000 mg | ORAL_TABLET | Freq: Every day | ORAL | Status: DC

## 2011-12-13 MED ORDER — ACYCLOVIR 400 MG PO TABS: 400.0000 mg | ORAL_TABLET | Freq: Two times a day (BID) | ORAL | Status: DC

## 2012-01-20 DIAGNOSIS — H1710 Central corneal opacity, unspecified eye: Secondary | ICD-10-CM | POA: Insufficient documentation

## 2012-01-20 DIAGNOSIS — IMO0002 Reserved for concepts with insufficient information to code with codable children: Secondary | ICD-10-CM | POA: Insufficient documentation

## 2012-02-28 ENCOUNTER — Ambulatory Visit: Payer: Medicare HMO | Admitting: Internal Medicine

## 2012-03-06 ENCOUNTER — Ambulatory Visit: Payer: Medicare HMO | Admitting: Internal Medicine

## 2012-03-07 ENCOUNTER — Encounter: Payer: Self-pay | Admitting: Internal Medicine

## 2012-03-07 ENCOUNTER — Ambulatory Visit (INDEPENDENT_AMBULATORY_CARE_PROVIDER_SITE_OTHER): Payer: Medicare HMO | Admitting: Internal Medicine

## 2012-03-07 VITALS — BP 146/80 | HR 84 | Temp 98.2°F | Resp 16 | Ht 72.0 in | Wt 220.0 lb

## 2012-03-07 DIAGNOSIS — G4700 Insomnia, unspecified: Secondary | ICD-10-CM

## 2012-03-07 DIAGNOSIS — N400 Enlarged prostate without lower urinary tract symptoms: Secondary | ICD-10-CM

## 2012-03-07 DIAGNOSIS — F32A Depression, unspecified: Secondary | ICD-10-CM

## 2012-03-07 DIAGNOSIS — K219 Gastro-esophageal reflux disease without esophagitis: Secondary | ICD-10-CM

## 2012-03-07 DIAGNOSIS — M549 Dorsalgia, unspecified: Secondary | ICD-10-CM

## 2012-03-07 DIAGNOSIS — I10 Essential (primary) hypertension: Secondary | ICD-10-CM

## 2012-03-07 DIAGNOSIS — J449 Chronic obstructive pulmonary disease, unspecified: Secondary | ICD-10-CM

## 2012-03-07 DIAGNOSIS — M1710 Unilateral primary osteoarthritis, unspecified knee: Secondary | ICD-10-CM

## 2012-03-07 DIAGNOSIS — F329 Major depressive disorder, single episode, unspecified: Secondary | ICD-10-CM

## 2012-03-07 DIAGNOSIS — J4489 Other specified chronic obstructive pulmonary disease: Secondary | ICD-10-CM

## 2012-03-07 DIAGNOSIS — IMO0002 Reserved for concepts with insufficient information to code with codable children: Secondary | ICD-10-CM

## 2012-03-07 DIAGNOSIS — E1165 Type 2 diabetes mellitus with hyperglycemia: Secondary | ICD-10-CM

## 2012-03-07 DIAGNOSIS — G8929 Other chronic pain: Secondary | ICD-10-CM

## 2012-03-07 LAB — BASIC METABOLIC PANEL
BUN: 13 mg/dL (ref 6–23)
Creatinine, Ser: 1.1 mg/dL (ref 0.4–1.5)
GFR: 72.39 mL/min (ref >60.00)

## 2012-03-07 LAB — CBC WITH DIFFERENTIAL/PLATELET
Basophils Relative: 0.3 % (ref 0.0–3.0)
Eosinophils Absolute: 0.1 10*3/uL (ref 0.0–0.7)
HCT: 33.6 % — ABNORMAL LOW (ref 39.0–52.0)
Hemoglobin: 10.8 g/dL — ABNORMAL LOW (ref 13.0–17.0)
Lymphs Abs: 2 10*3/uL (ref 0.7–4.0)
MCHC: 32.1 g/dL (ref 30.0–36.0)
MCV: 87.8 fl (ref 78.0–100.0)
Monocytes Absolute: 0.5 10*3/uL (ref 0.1–1.0)
Neutro Abs: 4.2 10*3/uL (ref 1.4–7.7)
RBC: 3.83 Mil/uL — ABNORMAL LOW (ref 4.22–5.81)

## 2012-03-07 MED ORDER — OMEPRAZOLE 20 MG PO CPDR: DELAYED_RELEASE_CAPSULE | ORAL | Status: DC

## 2012-03-07 MED ORDER — GABAPENTIN 300 MG PO CAPS: 300.0000 mg | ORAL_CAPSULE | Freq: Two times a day (BID) | ORAL | Status: DC

## 2012-03-07 MED ORDER — FLUOXETINE HCL 20 MG PO TABS: 20.0000 mg | ORAL_TABLET | ORAL | Status: DC

## 2012-03-07 MED ORDER — FAMOTIDINE 20 MG PO TABS: 20.0000 mg | ORAL_TABLET | Freq: Every evening | ORAL | Status: DC | PRN

## 2012-03-07 MED ORDER — GLYBURIDE 5 MG PO TABS: 5.0000 mg | ORAL_TABLET | Freq: Two times a day (BID) | ORAL | Status: DC

## 2012-03-07 MED ORDER — AMLODIPINE BESYLATE 5 MG PO TABS: 5.0000 mg | ORAL_TABLET | Freq: Every day | ORAL | Status: DC

## 2012-03-07 MED ORDER — METHYLPREDNISOLONE ACETATE 40 MG/ML IJ SUSP: 40.0000 mg | Freq: Once | INTRAMUSCULAR | Status: DC

## 2012-03-07 MED ORDER — AMITRIPTYLINE HCL 25 MG PO TABS: 25.0000 mg | ORAL_TABLET | Freq: Every day | ORAL | Status: DC

## 2012-03-07 MED ORDER — FINASTERIDE 5 MG PO TABS: 5.0000 mg | ORAL_TABLET | Freq: Every day | ORAL | Status: DC

## 2012-03-07 MED ORDER — NAPROXEN 500 MG PO TBEC: 500.0000 mg | DELAYED_RELEASE_TABLET | Freq: Two times a day (BID) | ORAL | Status: DC

## 2012-03-07 MED ORDER — OXYCODONE-ACETAMINOPHEN 7.5-325 MG PO TABS: 1.0000 | ORAL_TABLET | ORAL | Status: AC | PRN

## 2012-03-07 MED ORDER — TAMSULOSIN HCL 0.4 MG PO CAPS: 0.4000 mg | ORAL_CAPSULE | Freq: Every day | ORAL | Status: DC

## 2012-03-07 MED ORDER — ACYCLOVIR 400 MG PO TABS: 400.0000 mg | ORAL_TABLET | Freq: Two times a day (BID) | ORAL | Status: DC

## 2012-03-07 NOTE — Progress Notes (Signed)
Subjective:    Patient ID: [name], male    DOB: 07-21-1941, 71 y.o.   MRN: [id number]  HPI This patient is a 71 year old male who is followed for hypertension diabetes and chronic low back pain.  He presents today with an increase in his low back pain and pain in his right knee.  He states that his activity lately has increased and with that his pain has increased.  His blood pressure is moderately elevated today but he rates his pain as high.  He denies chest pain , PS some progression of COPD requiring oxygen at night he is followed by Dr. Melvyn Novas  He states that his CBGs have been around 100 and we will obtain an A1c today to correlate this   Review of Systems  Constitutional: Positive for fatigue. Negative for fever.  HENT: Negative for hearing loss, congestion, neck pain and postnasal drip.   Eyes: Negative for discharge, redness and visual disturbance.  Respiratory: Positive for chest tightness and shortness of breath. Negative for cough and wheezing.   Cardiovascular: Negative for leg swelling.  Gastrointestinal: Negative for abdominal pain, constipation and abdominal distention.  Genitourinary: Negative for urgency and frequency.  Musculoskeletal: Positive for myalgias, back pain and joint swelling. Negative for arthralgias.  Skin: Negative for color change and rash.  Neurological: Positive for weakness. Negative for light-headedness.  Hematological: Negative for adenopathy.  Psychiatric/Behavioral: Negative for behavioral problems.   Past Medical History  Diagnosis Date  . Hyperlipidemia   . Hypertension August 12, 2009    try off ACE for upper airway cough  . Coughing December 18, 2009    Sinus CT : Minor mucosal thickening at the Right Frontoethmoidal recess, otherwise  negative sinuses  . COPD (chronic obstructive pulmonary disease) December 15, 2009    FEV1 2.30 (70%) ratio 63 no better with B2 and DLCO 18/6 (73%) corrects to 106%  . Diabetes mellitus type II     . Degenerative joint disease   . Depression   . Benign prostatic hypertrophy   . Low back pain     History   Social History  . Marital Status: Married    Spouse Name: N/A    Number of Children: N/A  . Years of Education: N/A   Occupational History  . 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 Smoking status: Former Smoker -- 4.0 packs/day for 20 years    Types: Cigarettes    Quit date: 09/18/1984  . Smokeless tobacco: Not on file   Comment: quit in 1980  . Alcohol Use: No  . Drug Use: No  . Sexually Active: Yes   Other Topics Concern  . Not on file   Social History Narrative  . No narrative on file    Past Surgical History  Procedure Date  . Dvt 1980    Family History  Problem Relation Age of Onset  . Cancer Mother     melanoma  . Stroke Father     No Known Allergies  Current Outpatient Prescriptions on File Prior to Visit  Medication Sig Dispense Refill  . aspirin 81 MG tablet Take 81 mg by mouth daily.        . Cholecalciferol 1000 UNITS capsule Take 1 capsule (1,000 Units total) by mouth 2 (two) times daily.      . Multiple Vitamin (MULTIVITAMIN) capsule Take 1 capsule by mouth daily.        Marland Kitchen oxyCODONE-acetaminophen (PERCOCET) 5-325  MG per tablet Take 1 tablet by mouth every 6 (six) hours as needed.  120 tablet  0  . DISCONTD: amitriptyline (ELAVIL) 25 MG tablet Take 1 tablet (25 mg total) by mouth at bedtime.  90 tablet  3  . DISCONTD: amLODipine (NORVASC) 5 MG tablet Take 1 tablet (5 mg total) by mouth daily.  90 tablet  3  . DISCONTD: famotidine (PEPCID) 20 MG tablet One at bedtime      . DISCONTD: FLUoxetine (PROZAC) 20 MG tablet Take 1 tablet (20 mg total) by mouth every morning.  90 tablet  3  . DISCONTD: gabapentin (NEURONTIN) 300 MG capsule Take 1 capsule (300 mg total) by mouth 2 (two) times daily.  180 capsule  3  . DISCONTD: glyBURIDE (DIABETA) 5 MG tablet Take 1 tablet (5 mg total) by mouth 2 (two) times daily.  180  tablet  3  . DISCONTD: omeprazole (PRILOSEC) 20 MG capsule Take one 30-60 min before first meal of the day        BP 146/80  Pulse 84  Temp 98.2 F (36.8 C)  Resp 16  Ht 6' (1.829 m)  Wt 220 lb (99.791 kg)  BMI 29.84 kg/m2       Objective:   Physical Exam  Nursing note and vitals reviewed. HENT:  Head: Normocephalic and atraumatic.  Eyes: Conjunctivae and EOM are normal.  Neck: Normal range of motion. Neck supple.  Abdominal: Soft. Bowel sounds are normal.  Musculoskeletal: He exhibits edema and tenderness.  Skin: Skin is warm and dry.          Assessment & Plan:  We will moderately increase his Percocet to help with pain and we'll give him an injection in his knee of cortisone and lidocaine to aid in pain control.  The ultimate answer will be total knee replacement but he desires to wait on that at this time I think we must consider that especially if the pain shot does not effectively control his pain long-term.  His blood pressure is elevated today most probably due to the pain. He is stable from the standpoint of cardiovascular disease but has some progression of his COPD requiring oxygen at night. We will continue to monitor him closely today we will draw a CBC with differential hemoglobin A1c and a basic metabolic panel to look at renal function and potassium   Informed consent obtained and the patient's right knee was prepped with betadine. Local anesthesia was obtained with topical spray. Then 40 mg of Depo-Medrol and 1/2 cc of lidocaine was injected into the joint space. The patient tolerated the procedure without complications. Post injection care discussed with patient.

## 2012-03-07 NOTE — Patient Instructions (Signed)
The patient is instructed to continue all medications as prescribed. Schedule followup with check out clerk upon leaving the clinic  

## 2012-04-06 ENCOUNTER — Other Ambulatory Visit: Payer: Self-pay | Admitting: Internal Medicine

## 2012-04-06 MED ORDER — OXYCODONE-ACETAMINOPHEN 7.5-325 MG PO TABS: 1.0000 | ORAL_TABLET | ORAL | Status: AC | PRN

## 2012-04-06 NOTE — Telephone Encounter (Signed)
Pt called req to get 2 scripts for oxyCODONE-acetaminophen (PERCOCET) 7.5-325 MG per tablet for pick up.

## 2012-04-06 NOTE — Telephone Encounter (Signed)
Printed and will call pt when ready for  pidk up

## 2012-04-30 ENCOUNTER — Telehealth: Payer: Self-pay | Admitting: Internal Medicine

## 2012-04-30 ENCOUNTER — Ambulatory Visit (INDEPENDENT_AMBULATORY_CARE_PROVIDER_SITE_OTHER): Payer: Medicare HMO | Admitting: Family Medicine

## 2012-04-30 ENCOUNTER — Encounter: Payer: Self-pay | Admitting: Family Medicine

## 2012-04-30 VITALS — BP 120/60 | HR 80 | Temp 98.0°F | Resp 14 | Wt 216.0 lb

## 2012-04-30 DIAGNOSIS — J4489 Other specified chronic obstructive pulmonary disease: Secondary | ICD-10-CM

## 2012-04-30 DIAGNOSIS — J449 Chronic obstructive pulmonary disease, unspecified: Secondary | ICD-10-CM

## 2012-04-30 DIAGNOSIS — J441 Chronic obstructive pulmonary disease with (acute) exacerbation: Secondary | ICD-10-CM

## 2012-04-30 MED ORDER — METHYLPREDNISOLONE ACETATE 80 MG/ML IJ SUSP
80.0000 mg | Freq: Once | INTRAMUSCULAR | Status: AC
  Administered 2012-04-30: 80 mg via INTRAMUSCULAR

## 2012-04-30 MED ORDER — LEVOFLOXACIN 500 MG PO TABS: 500.0000 mg | ORAL_TABLET | Freq: Every day | ORAL | Status: AC

## 2012-04-30 NOTE — Patient Instructions (Addendum)
Follow up for any fever, increased shortness of breath, or any persistent cough Monitor blood sugars closely and realize they may go up some while prednisone in your system.

## 2012-04-30 NOTE — Progress Notes (Signed)
  Subjective:    Patient ID: [name], male    DOB: 1941/03/12, 71 y.o.   MRN: [id number]  HPI  Acute visit. Patient history type 2 diabetes, hyperlipidemia, hypertension, COPD. Seen with about 3 week history of cough. Occasionally dry and occasionally productive of cloudy sputum. No hemoptysis. He has some chronic dyspnea which is slightly increased over the past couple of weeks. No fever. Has some wheezing off and on. Currently not using any inhalers. Ex-smoker. Type 2 diabetes with recent A1c 6.5%. Reportedly had complicated pneumonia last year. He is worried about the same today but has not had any fever. No appetite or weight change.  Past Medical History  Diagnosis Date  . Hyperlipidemia   . Hypertension August 12, 2009    try off ACE for upper airway cough  . Coughing December 18, 2009    Sinus CT : Minor mucosal thickening at the Right Frontoethmoidal recess, otherwise  negative sinuses  . COPD (chronic obstructive pulmonary disease) December 15, 2009    FEV1 2.30 (70%) ratio 63 no better with B2 and DLCO 18/6 (73%) corrects to 106%  . Diabetes mellitus type II   . Degenerative joint disease   . Depression   . Benign prostatic hypertrophy   . Low back pain    Past Surgical History  Procedure Date  . Dvt 1980    reports that he quit smoking about 27 years ago. His smoking use included Cigarettes. He has a 80 pack-year smoking history. He does not have any smokeless tobacco history on file. He reports that he does not drink alcohol or use illicit drugs. family history includes Cancer in his mother and Stroke in his father. No Known Allergies    Review of Systems  Constitutional: Positive for fatigue. Negative for fever, chills, appetite change and unexpected weight change.  HENT: Negative for congestion.   Respiratory: Positive for cough, shortness of breath and wheezing.   Cardiovascular: Negative for chest pain, palpitations and leg swelling.  Neurological: Negative for  dizziness and headaches.       Objective:   Physical Exam  Constitutional: He is oriented to person, place, and time. He appears well-developed and well-nourished.  HENT:  Right Ear: External ear normal.  Left Ear: External ear normal.  Mouth/Throat: Oropharynx is clear and moist.  Neck: Neck supple.  Cardiovascular: Normal rate and regular rhythm.   Pulmonary/Chest:       Patient has some diffuse wheezes. No retractions. No rales.  Musculoskeletal: He exhibits no edema.  Lymphadenopathy:    He has no cervical adenopathy.  Neurological: He is alert and oriented to person, place, and time.          Assessment & Plan:  Acute exacerbation of COPD. Given productive cough start Levaquin 500 milligrams once daily. Depo-Medrol 80 mg IM given. He is aware this may elevate blood sugars short-term. Followup with primary if cough not improving over the next week.

## 2012-04-30 NOTE — Telephone Encounter (Signed)
Caller: [name]/Patient; PCP: Benay Pillow; CB#: [phone];  Call regarding Deep Moist Cough/ Chest Congestion; Onset: approx 04/10/12.  Afebrile. FBS not checked 04/30/12 was 105 hs 04/29/12.  Reports shortness of breath with ambulation. Hx pneumonia.  Advised to see MD within 24 hrs for sharp fleeting chest pain only occuring with deep breath or cough and not responsive to home care per Cough Guideline.  No appts remain with Dr Arnoldo Morale or P. Megan Salon PA for 04/30/12 or 05/01/12; Info noted and sent to [id number] for call back to pt. Prefers to see Dr Arnoldo Morale.

## 2012-04-30 NOTE — Telephone Encounter (Signed)
Patient has an appointment with Dr. Burchette today at 4pm. 

## 2012-06-04 ENCOUNTER — Other Ambulatory Visit: Payer: Self-pay | Admitting: *Deleted

## 2012-06-04 ENCOUNTER — Ambulatory Visit (INDEPENDENT_AMBULATORY_CARE_PROVIDER_SITE_OTHER): Payer: Medicare HMO | Admitting: Family Medicine

## 2012-06-04 ENCOUNTER — Encounter: Payer: Self-pay | Admitting: Family Medicine

## 2012-06-04 VITALS — BP 150/80 | Temp 98.4°F | Wt 211.0 lb

## 2012-06-04 DIAGNOSIS — J449 Chronic obstructive pulmonary disease, unspecified: Secondary | ICD-10-CM

## 2012-06-04 DIAGNOSIS — E119 Type 2 diabetes mellitus without complications: Secondary | ICD-10-CM

## 2012-06-04 DIAGNOSIS — R0989 Other specified symptoms and signs involving the circulatory and respiratory systems: Secondary | ICD-10-CM

## 2012-06-04 DIAGNOSIS — R0600 Dyspnea, unspecified: Secondary | ICD-10-CM

## 2012-06-04 DIAGNOSIS — R062 Wheezing: Secondary | ICD-10-CM

## 2012-06-04 MED ORDER — ALBUTEROL SULFATE HFA 108 (90 BASE) MCG/ACT IN AERS: 2.0000 | INHALATION_SPRAY | RESPIRATORY_TRACT | Status: DC | PRN

## 2012-06-04 MED ORDER — PREDNISONE 10 MG PO TABS: ORAL_TABLET | ORAL | Status: DC

## 2012-06-04 MED ORDER — GLYBURIDE-METFORMIN 2.5-500 MG PO TABS: 1.0000 | ORAL_TABLET | Freq: Two times a day (BID) | ORAL | Status: DC

## 2012-06-04 NOTE — Patient Instructions (Addendum)
Monitor blood sugars closely while on prednisone Follow up promptly for any fever.

## 2012-06-04 NOTE — Progress Notes (Signed)
  Subjective:    Patient ID: [name], male    DOB: 11-10-1940, 71 y.o.   MRN: [id number]  HPI  Persistent cough and shortness of breath. Patient seen recently and treated with Levaquin for productive cough and Depo-Medrol. His coughing is now dry. He's had some ongoing wheezing and shortness of breath with activity. No significant dyspnea at rest. Denies chest pain. No peripheral edema. Smoker. Quit in 1985. 5 pound weight loss since last visit. Somewhat poor appetite. No hemoptysis. No pleuritic pain.  He has type 2 diabetes which has been fairly well controlled. Current dyspnea exacerbated with activity. No arm or neck pain. Has home oxygen which he uses intermittently.  Past Medical History  Diagnosis Date  . Hyperlipidemia   . Hypertension August 12, 2009    try off ACE for upper airway cough  . Coughing December 18, 2009    Sinus CT : Minor mucosal thickening at the Right Frontoethmoidal recess, otherwise  negative sinuses  . COPD (chronic obstructive pulmonary disease) December 15, 2009    FEV1 2.30 (70%) ratio 63 no better with B2 and DLCO 18/6 (73%) corrects to 106%  . Diabetes mellitus type II   . Degenerative joint disease   . Depression   . Benign prostatic hypertrophy   . Low back pain    Past Surgical History  Procedure Date  . Dvt 1980    reports that he quit smoking about 27 years ago. His smoking use included Cigarettes. He has a 80 pack-year smoking history. He does not have any smokeless tobacco history on file. He reports that he does not drink alcohol or use illicit drugs. family history includes Cancer in his mother and Stroke in his father. No Known Allergies    Review of Systems  Constitutional: Positive for appetite change and fatigue. Negative for fever and chills.  HENT: Negative for congestion.   Respiratory: Positive for cough, shortness of breath and wheezing.   Cardiovascular: Negative for chest pain, palpitations and leg swelling.  Neurological:  Negative for dizziness and headaches.  Hematological: Negative for adenopathy.       Objective:   Physical Exam  Constitutional: He appears well-developed and well-nourished.  HENT:  Mouth/Throat: Oropharynx is clear and moist.  Neck: Neck supple.  Cardiovascular: Regular rhythm.        Mildly tachycardic with heart rate around 100 with movement. Regular rhythm  Pulmonary/Chest:       Patient has diffuse wheezes. No retractions. No rales. Symmetric breath sounds. Respiratory rate 12-14 at rest  Musculoskeletal: He exhibits no edema.  Neurological: He is alert.          Assessment & Plan:  Dyspnea. History of reported COPD with current exacerbation. Suspect this is still pulmonary related given his increased wheezing and cough. Prednisone oral taper and monitor blood sugars closely. Albuterol 2 puffs every 4-6 hours when necessary. Obtain chest x-ray. Consider pulmonary referral if not improving over the next few days

## 2012-06-05 ENCOUNTER — Ambulatory Visit (INDEPENDENT_AMBULATORY_CARE_PROVIDER_SITE_OTHER)
Admission: RE | Admit: 2012-06-05 | Discharge: 2012-06-05 | Disposition: A | Discharge status: Home / Self Care | Payer: Medicare HMO | Source: Ambulatory Visit | Attending: Family Medicine | Admitting: Family Medicine

## 2012-06-05 DIAGNOSIS — R0609 Other forms of dyspnea: Secondary | ICD-10-CM

## 2012-06-05 DIAGNOSIS — R0600 Dyspnea, unspecified: Secondary | ICD-10-CM

## 2012-06-05 DIAGNOSIS — R0989 Other specified symptoms and signs involving the circulatory and respiratory systems: Secondary | ICD-10-CM

## 2012-06-05 DIAGNOSIS — R062 Wheezing: Secondary | ICD-10-CM

## 2012-06-05 NOTE — Progress Notes (Signed)
Quick Note:  Called and spoke with pt and pt is aware. ______ 

## 2012-06-07 NOTE — Progress Notes (Signed)
Quick Note:  Pt informed ______ 

## 2012-06-08 ENCOUNTER — Encounter: Payer: Self-pay | Admitting: Internal Medicine

## 2012-06-08 ENCOUNTER — Ambulatory Visit (INDEPENDENT_AMBULATORY_CARE_PROVIDER_SITE_OTHER): Payer: Medicare HMO | Admitting: Internal Medicine

## 2012-06-08 DIAGNOSIS — I10 Essential (primary) hypertension: Secondary | ICD-10-CM

## 2012-06-08 DIAGNOSIS — E119 Type 2 diabetes mellitus without complications: Secondary | ICD-10-CM

## 2012-06-08 DIAGNOSIS — R0989 Other specified symptoms and signs involving the circulatory and respiratory systems: Secondary | ICD-10-CM

## 2012-06-08 DIAGNOSIS — J449 Chronic obstructive pulmonary disease, unspecified: Secondary | ICD-10-CM

## 2012-06-08 DIAGNOSIS — R0600 Dyspnea, unspecified: Secondary | ICD-10-CM

## 2012-06-08 LAB — BASIC METABOLIC PANEL
BUN: 21 mg/dL (ref 6–23)
Calcium: 9 mg/dL (ref 8.4–10.5)
Creatinine, Ser: 1.1 mg/dL (ref 0.4–1.5)
GFR: 70.06 mL/min (ref >60.00)
Glucose, Bld: 53 mg/dL — ABNORMAL LOW (ref 70–99)
Sodium: 145 mEq/L (ref 135–145)

## 2012-06-08 NOTE — Patient Instructions (Signed)
The patient is instructed to continue all medications as prescribed. Schedule followup with check out clerk upon leaving the clinic  

## 2012-06-08 NOTE — Progress Notes (Signed)
Subjective:    Patient ID: [name], male    DOB: 12/27/1940, 71 y.o.   MRN: [id number]  HPI This 71 year old male who is followed for hypertension hyperlipidemia and type 2 diabetes with a history of COPD who presented with a recurrent pneumonia.  He required antibiotic therapy prednisone and oxygen.  He is concerned that his heart may be involved in this diagnosis he has a history of CAD and hypertension... he did not have a history of prior congestive heart failure.     Review of Systems  Constitutional: Positive for activity change and appetite change.  HENT: Positive for congestion and rhinorrhea.   Respiratory: Positive for shortness of breath and wheezing.   Cardiovascular: Positive for palpitations and leg swelling.  Genitourinary: Positive for frequency.  Neurological: Positive for weakness. Negative for tremors.  Psychiatric/Behavioral: Positive for behavioral problems and confusion.   Past Medical History  Diagnosis Date  . Hyperlipidemia   . Hypertension August 12, 2009    try off ACE for upper airway cough  . Coughing December 18, 2009    Sinus CT : Minor mucosal thickening at the Right Frontoethmoidal recess, otherwise  negative sinuses  . COPD (chronic obstructive pulmonary disease) December 15, 2009    FEV1 2.30 (70%) ratio 63 no better with B2 and DLCO 18/6 (73%) corrects to 106%  . Diabetes mellitus type II   . Degenerative joint disease   . Depression   . Benign prostatic hypertrophy   . Low back pain     History   Social History  . Marital Status: Married    Spouse Name: N/A    Number of Children: N/A  . Years of Education: N/A   Occupational History  . 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 Smoking status: Former Smoker -- 4.0 packs/day for 20 years    Types: Cigarettes    Quit date: 09/18/1984  . Smokeless tobacco: Not on file   Comment: quit in 1980  . Alcohol Use: No  . Drug Use: No  . Sexually Active: Yes    Other Topics Concern  . Not on file   Social History Narrative  . No narrative on file    Past Surgical History  Procedure Date  . Dvt 1980    Family History  Problem Relation Age of Onset  . Cancer Mother     melanoma  . Stroke Father     No Known Allergies  Current Outpatient Prescriptions on File Prior to Visit  Medication Sig Dispense Refill  . acyclovir (ZOVIRAX) 400 MG tablet Take 1 tablet (400 mg total) by mouth 2 (two) times daily.  180 tablet  3  . albuterol (PROVENTIL HFA;VENTOLIN HFA) 108 (90 BASE) MCG/ACT inhaler Inhale 2 puffs into the lungs every 4 (four) hours as needed for wheezing.  1 Inhaler  1  . amitriptyline (ELAVIL) 25 MG tablet Take 1 tablet (25 mg total) by mouth at bedtime.  90 tablet  3  . amLODipine (NORVASC) 5 MG tablet Take 1 tablet (5 mg total) by mouth daily.  90 tablet  3  . aspirin 81 MG tablet Take 81 mg by mouth daily.        . Cholecalciferol 1000 UNITS capsule Take 1 capsule (1,000 Units total) by mouth 2 (two) times daily.      . famotidine (PEPCID) 20 MG tablet Take 1 tablet (20 mg total) by mouth at bedtime as needed for heartburn.  One at bedtime  90 tablet  3  . finasteride (PROSCAR) 5 MG tablet Take 1 tablet (5 mg total) by mouth daily.  90 tablet  3  . FLUoxetine (PROZAC) 20 MG tablet Take 1 tablet (20 mg total) by mouth every morning.  90 tablet  3  . gabapentin (NEURONTIN) 300 MG capsule Take 1 capsule (300 mg total) by mouth 2 (two) times daily.  180 capsule  3  . glyBURIDE (DIABETA) 5 MG tablet Take 1 tablet (5 mg total) by mouth 2 (two) times daily.  180 tablet  3  . glyBURIDE-metformin (GLUCOVANCE) 2.5-500 MG per tablet Take 1 tablet by mouth 2 (two) times daily with a meal.  180 tablet  3  . Multiple Vitamin (MULTIVITAMIN) capsule Take 1 capsule by mouth daily.        . naproxen (EC-NAPROSYN) 500 MG EC tablet Take 1 tablet (500 mg total) by mouth 2 (two) times daily with a meal.  180 tablet  3  . omeprazole (PRILOSEC) 20 MG  capsule Take one 30-60 min before first meal of the day  90 capsule  3  . predniSONE (DELTASONE) 10 MG tablet Taper as follows: 6-6-4-4-4-3-3-2-1  33 tablet  0  . Tamsulosin HCl (FLOMAX) 0.4 MG CAPS Take 1 capsule (0.4 mg total) by mouth daily.  90 capsule  3   Current Facility-Administered Medications on File Prior to Visit  Medication Dose Route Frequency Provider Last Rate Last Dose  . methylPREDNISolone acetate (DEPO-MEDROL) injection 40 mg  40 mg Intra-articular Once Ricard Dillon, MD        There were no vitals taken for this visit.       Objective:   Physical Exam  Nursing note and vitals reviewed. Constitutional: He is oriented to person, place, and time. He appears well-developed and well-nourished.  HENT:  Head: Normocephalic and atraumatic.  Eyes: Conjunctivae are normal. Pupils are equal, round, and reactive to light.  Neck: Normal range of motion. Neck supple.  Cardiovascular: Normal rate and regular rhythm.   Murmur heard. Pulmonary/Chest: Effort normal and breath sounds normal. He has no wheezes. He has no rales.  Abdominal: Soft. Bowel sounds are normal. He exhibits distension.  Neurological: He is alert and oriented to person, place, and time.          Assessment & Plan:    Patient is on prednisone so blood sugars will probably be elevated we will not draw hemoglobin A1c today last hemoglobin A1c was 6.5 showing good control.  We will not draw one today we will draw one at the next visit.  The patient has seasonal allergies and recurrent pneumonia that has occurred in the summer for this [place].  He had a CT scan which did not reveal any post obstructive processes he does have COPD and a seasonal ask exacerbation of the COPD as possible as a precipitating event.   The patient is instructed that if he has symptoms of productive cough especially if there is sputum with color change involved he should contact drop as her antibiotics quickly given his  COPD and his history I would have a low threshold for prescribing antibiotics in the setting of bronchitis for this patient

## 2012-06-12 ENCOUNTER — Telehealth: Payer: Self-pay | Admitting: Internal Medicine

## 2012-06-12 NOTE — Telephone Encounter (Signed)
Rx last filled 05/15/12. Pt last seen 06/08/12. Pls advise.

## 2012-06-12 NOTE — Telephone Encounter (Signed)
Pt called re: refill of oxyCODONE-acetaminophen (PERCOCET) 7.5-325 MG per tablet. Pt is leaving to go out of town early Thursday morning and will need to pick up script before he leaves.

## 2012-06-12 NOTE — Telephone Encounter (Signed)
Ok to refill x 1 only. Please print rx for signature

## 2012-06-13 MED ORDER — OXYCODONE-ACETAMINOPHEN 7.5-325 MG PO TABS: 1.0000 | ORAL_TABLET | ORAL | Status: DC | PRN

## 2012-06-13 NOTE — Telephone Encounter (Signed)
Rx printed. Left a message making pt aware.

## 2012-06-19 ENCOUNTER — Telehealth: Payer: Self-pay | Admitting: Internal Medicine

## 2012-06-19 MED ORDER — OXYCODONE-ACETAMINOPHEN 7.5-325 MG PO TABS: 1.0000 | ORAL_TABLET | ORAL | Status: DC | PRN

## 2012-06-19 NOTE — Telephone Encounter (Signed)
Patient came in to pick up his rx for percocet and he states that he usually gets 3 however Dr. Shawna Orleans approved one mth as Dr. Arnoldo Morale was not in the office. Patient would like to have his other two months rx. Please assist.

## 2012-06-19 NOTE — Telephone Encounter (Signed)
Printed will call pt when ready for pick up

## 2012-06-25 ENCOUNTER — Encounter: Payer: Self-pay | Admitting: Internal Medicine

## 2012-06-25 ENCOUNTER — Ambulatory Visit (INDEPENDENT_AMBULATORY_CARE_PROVIDER_SITE_OTHER): Payer: Medicare HMO | Admitting: Internal Medicine

## 2012-06-25 VITALS — BP 128/80 | Temp 98.2°F | Wt 208.0 lb

## 2012-06-25 DIAGNOSIS — R059 Cough, unspecified: Secondary | ICD-10-CM

## 2012-06-25 DIAGNOSIS — R05 Cough: Secondary | ICD-10-CM

## 2012-06-25 DIAGNOSIS — I10 Essential (primary) hypertension: Secondary | ICD-10-CM

## 2012-06-25 DIAGNOSIS — E119 Type 2 diabetes mellitus without complications: Secondary | ICD-10-CM

## 2012-06-25 DIAGNOSIS — J449 Chronic obstructive pulmonary disease, unspecified: Secondary | ICD-10-CM

## 2012-06-25 DIAGNOSIS — J9610 Chronic respiratory failure, unspecified whether with hypoxia or hypercapnia: Secondary | ICD-10-CM

## 2012-06-25 DIAGNOSIS — J4489 Other specified chronic obstructive pulmonary disease: Secondary | ICD-10-CM

## 2012-06-25 MED ORDER — AZITHROMYCIN 250 MG PO TABS: ORAL_TABLET | ORAL | Status: DC

## 2012-06-25 NOTE — Progress Notes (Signed)
Subjective:    Patient ID: [name], male    DOB: 06/11/1941, 71 y.o.   MRN: [id number]  HPI  71 year old patient who has a history of advanced COPD and prior history of bronchopneumonia. He is on home O2 at least intermittently and does have a history of chronic hypoxic respiratory insufficiency. He was seen 2 weeks ago complaining of increasing cough and shortness of breath laboratory studies were obtained including BNP which was normal. For the past several days he has had increasing cough and shortness of breath;  he became quite dyspneic yesterday and almost  called 911. Complaints include weakness and shortness of breath but no real fever chills or sputum production.  Past Medical History  Diagnosis Date  . Hyperlipidemia   . Hypertension August 12, 2009    try off ACE for upper airway cough  . Coughing December 18, 2009    Sinus CT : Minor mucosal thickening at the Right Frontoethmoidal recess, otherwise  negative sinuses  . COPD (chronic obstructive pulmonary disease) December 15, 2009    FEV1 2.30 (70%) ratio 63 no better with B2 and DLCO 18/6 (73%) corrects to 106%  . Diabetes mellitus type II   . Degenerative joint disease   . Depression   . Benign prostatic hypertrophy   . Low back pain     History   Social History  . Marital Status: Married    Spouse Name: N/A    Number of Children: N/A  . Years of Education: N/A   Occupational History  . 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 Smoking status: Former Smoker -- 4.0 packs/day for 20 years    Types: Cigarettes    Quit date: 09/18/1984  . Smokeless tobacco: Not on file   Comment: quit in 1980  . Alcohol Use: No  . Drug Use: No  . Sexually Active: Yes   Other Topics Concern  . Not on file   Social History Narrative  . No narrative on file    Past Surgical History  Procedure Date  . Dvt 1980    Family History  Problem Relation Age of Onset  . Cancer Mother     melanoma  . Stroke  Father     No Known Allergies  Current Outpatient Prescriptions on File Prior to Visit  Medication Sig Dispense Refill  . acyclovir (ZOVIRAX) 400 MG tablet Take 1 tablet (400 mg total) by mouth 2 (two) times daily.  180 tablet  3  . albuterol (PROVENTIL HFA;VENTOLIN HFA) 108 (90 BASE) MCG/ACT inhaler Inhale 2 puffs into the lungs every 4 (four) hours as needed for wheezing.  1 Inhaler  1  . amitriptyline (ELAVIL) 25 MG tablet Take 1 tablet (25 mg total) by mouth at bedtime.  90 tablet  3  . amLODipine (NORVASC) 5 MG tablet Take 1 tablet (5 mg total) by mouth daily.  90 tablet  3  . aspirin 81 MG tablet Take 81 mg by mouth daily.        . Cholecalciferol 1000 UNITS capsule Take 1 capsule (1,000 Units total) by mouth 2 (two) times daily.      . famotidine (PEPCID) 20 MG tablet Take 1 tablet (20 mg total) by mouth at bedtime as needed for heartburn. One at bedtime  90 tablet  3  . finasteride (PROSCAR) 5 MG tablet Take 1 tablet (5 mg total) by mouth daily.  90 tablet  3  . FLUoxetine (  PROZAC) 20 MG tablet Take 1 tablet (20 mg total) by mouth every morning.  90 tablet  3  . gabapentin (NEURONTIN) 300 MG capsule Take 1 capsule (300 mg total) by mouth 2 (two) times daily.  180 capsule  3  . glyBURIDE (DIABETA) 5 MG tablet Take 1 tablet (5 mg total) by mouth 2 (two) times daily.  180 tablet  3  . glyBURIDE-metformin (GLUCOVANCE) 2.5-500 MG per tablet Take 1 tablet by mouth 2 (two) times daily with a meal.  180 tablet  3  . Multiple Vitamin (MULTIVITAMIN) capsule Take 1 capsule by mouth daily.        . naproxen (EC-NAPROSYN) 500 MG EC tablet Take 1 tablet (500 mg total) by mouth 2 (two) times daily with a meal.  180 tablet  3  . omeprazole (PRILOSEC) 20 MG capsule Take one 30-60 min before first meal of the day  90 capsule  3  . oxyCODONE-acetaminophen (PERCOCET) 7.5-325 MG per tablet Take 1 tablet by mouth every 4 (four) hours as needed for pain.  120 tablet  0  . predniSONE (DELTASONE) 10 MG tablet  Taper as follows: 6-6-4-4-4-3-3-2-1  33 tablet  0  . Tamsulosin HCl (FLOMAX) 0.4 MG CAPS Take 1 capsule (0.4 mg total) by mouth daily.  90 capsule  3   Current Facility-Administered Medications on File Prior to Visit  Medication Dose Route Frequency Provider Last Rate Last Dose  . methylPREDNISolone acetate (DEPO-MEDROL) injection 40 mg  40 mg Intra-articular Once Ricard Dillon, MD        BP 128/80  Temp 98.2 F (36.8 C) (Oral)  Wt 208 lb (94.348 kg)  SpO2 91%       Review of Systems  Constitutional: Positive for fatigue. Negative for fever, chills and appetite change.  HENT: Negative for hearing loss, ear pain, congestion, sore throat, trouble swallowing, neck stiffness, dental problem, voice change and tinnitus.   Eyes: Negative for pain, discharge and visual disturbance.  Respiratory: Positive for cough, shortness of breath and wheezing. Negative for chest tightness and stridor.   Cardiovascular: Negative for chest pain, palpitations and leg swelling.  Gastrointestinal: Negative for nausea, vomiting, abdominal pain, diarrhea, constipation, blood in stool and abdominal distention.  Genitourinary: Negative for urgency, hematuria, flank pain, discharge, difficulty urinating and genital sores.  Musculoskeletal: Negative for myalgias, back pain, joint swelling, arthralgias and gait problem.  Skin: Negative for rash.  Neurological: Positive for weakness. Negative for dizziness, syncope, speech difficulty, numbness and headaches.  Hematological: Negative for adenopathy. Does not bruise/bleed easily.  Psychiatric/Behavioral: Negative for behavioral problems and dysphoric mood. The patient is not nervous/anxious.        Objective:   Physical Exam  Constitutional: He is oriented to person, place, and time. He appears well-developed and well-nourished. No distress.  HENT:  Head: Normocephalic.  Right Ear: External ear normal.  Left Ear: External ear normal.  Eyes: Conjunctivae  normal and EOM are normal.  Neck: Normal range of motion.  Cardiovascular: Normal rate and normal heart sounds.   Pulmonary/Chest: Effort normal. No respiratory distress.       Oxygen level as high as 94 Resting pulse rate 110  Coarse rhonchi involving the lower two third lung fields bilaterally  Abdominal: Bowel sounds are normal.  Musculoskeletal: Normal range of motion. He exhibits no edema and no tenderness.  Neurological: He is alert and oriented to person, place, and time.  Psychiatric: He has a normal mood and affect. His behavior is normal.  Assessment & Plan:   Exacerbation of COPD. Will place on azithromycin and administer a prednisone dose pack.. Will also start on Dulera and expectorants. Last return in one week for followup Hypertension stable Diabetes. We'll asked to monitor home blood sugars more carefully do to prednisone use

## 2012-06-25 NOTE — Patient Instructions (Signed)
Dulera  1 ablation twice daily Albuterol inhaler 2 inhalations every 6 hours  Mucinex one twice daily Antibiotic therapy as directed  Return in one week for followup  Use oxygen therapy on a regular basis until improved Report to the ED for evaluation and possible admission  if any clinical deterioration

## 2012-07-02 ENCOUNTER — Ambulatory Visit (INDEPENDENT_AMBULATORY_CARE_PROVIDER_SITE_OTHER): Payer: Medicare HMO | Admitting: Internal Medicine

## 2012-07-02 ENCOUNTER — Encounter: Payer: Self-pay | Admitting: Internal Medicine

## 2012-07-02 VITALS — BP 132/80 | Temp 97.8°F | Wt 206.0 lb

## 2012-07-02 DIAGNOSIS — J849 Interstitial pulmonary disease, unspecified: Secondary | ICD-10-CM

## 2012-07-02 DIAGNOSIS — J9610 Chronic respiratory failure, unspecified whether with hypoxia or hypercapnia: Secondary | ICD-10-CM

## 2012-07-02 DIAGNOSIS — E119 Type 2 diabetes mellitus without complications: Secondary | ICD-10-CM

## 2012-07-02 DIAGNOSIS — J8410 Pulmonary fibrosis, unspecified: Secondary | ICD-10-CM

## 2012-07-02 DIAGNOSIS — J449 Chronic obstructive pulmonary disease, unspecified: Secondary | ICD-10-CM

## 2012-07-02 DIAGNOSIS — I10 Essential (primary) hypertension: Secondary | ICD-10-CM

## 2012-07-02 NOTE — Progress Notes (Signed)
Subjective:    Patient ID: [name], male    DOB: [birth year], 71 y.o.   MRN: [id number]  HPI  71 year old patient who is seen today for followup. Patient has a history of COPD and is a former 4 pack per day smoker but has not smoked in the past 40 years. PFTs in the past have revealed an FEV1 of 70% predicted.  He was hospitalized in June of 2012 and CT scan revealed considerable interstitial infiltrates.  He has a significant environmental exposure history to Asbestos and has also worked in a [employer].  He has a history chronic hypoxic respiratory insufficiency and presently is using nocturnal oxygen only.  He was seen one week ago and treated with azithromycin for worsening chest congestion cough and shortness of breath. Today he is much improved  Past Medical History  Diagnosis Date  . Hyperlipidemia   . Hypertension August 12, 2009    try off ACE for upper airway cough  . Coughing December 18, 2009    Sinus CT : Minor mucosal thickening at the Right Frontoethmoidal recess, otherwise  negative sinuses  . COPD (chronic obstructive pulmonary disease) December 15, 2009    FEV1 2.30 (70%) ratio 63 no better with B2 and DLCO 18/6 (73%) corrects to 106%  . Diabetes mellitus type II   . Degenerative joint disease   . Depression   . Benign prostatic hypertrophy   . Low back pain     History   Social History  . Marital Status: Married    Spouse Name: N/A    Number of Children: N/A  . Years of Education: N/A   Occupational History  . 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 Smoking status: Former Smoker -- 4.0 packs/day for 20 years    Types: Cigarettes    Quit date: 09/18/1984  . Smokeless tobacco: Not on file   Comment: quit in 1980  . Alcohol Use: No  . Drug Use: No  . Sexually Active: Yes   Other Topics Concern  . Not on file   Social History Narrative  . No narrative on file    Past Surgical History  Procedure Date  . Dvt 1980    Family  History  Problem Relation Age of Onset  . Cancer Mother     melanoma  . Stroke Father     No Known Allergies  Current Outpatient Prescriptions on File Prior to Visit  Medication Sig Dispense Refill  . acyclovir (ZOVIRAX) 400 MG tablet Take 1 tablet (400 mg total) by mouth 2 (two) times daily.  180 tablet  3  . albuterol (PROVENTIL HFA;VENTOLIN HFA) 108 (90 BASE) MCG/ACT inhaler Inhale 2 puffs into the lungs every 4 (four) hours as needed for wheezing.  1 Inhaler  1  . amitriptyline (ELAVIL) 25 MG tablet Take 1 tablet (25 mg total) by mouth at bedtime.  90 tablet  3  . amLODipine (NORVASC) 5 MG tablet Take 1 tablet (5 mg total) by mouth daily.  90 tablet  3  . aspirin 81 MG tablet Take 81 mg by mouth daily.        Marland Kitchen azithromycin (ZITHROMAX) 250 MG tablet 2 initially then 1 daily for 4 additional days  6 tablet  0  . Cholecalciferol 1000 UNITS capsule Take 1 capsule (1,000 Units total) by mouth 2 (two) times daily.      . famotidine (PEPCID) 20 MG tablet Take 1 tablet (  20 mg total) by mouth at bedtime as needed for heartburn. One at bedtime  90 tablet  3  . finasteride (PROSCAR) 5 MG tablet Take 1 tablet (5 mg total) by mouth daily.  90 tablet  3  . FLUoxetine (PROZAC) 20 MG tablet Take 1 tablet (20 mg total) by mouth every morning.  90 tablet  3  . gabapentin (NEURONTIN) 300 MG capsule Take 1 capsule (300 mg total) by mouth 2 (two) times daily.  180 capsule  3  . glyBURIDE (DIABETA) 5 MG tablet Take 1 tablet (5 mg total) by mouth 2 (two) times daily.  180 tablet  3  . glyBURIDE-metformin (GLUCOVANCE) 2.5-500 MG per tablet Take 1 tablet by mouth 2 (two) times daily with a meal.  180 tablet  3  . Multiple Vitamin (MULTIVITAMIN) capsule Take 1 capsule by mouth daily.        . naproxen (EC-NAPROSYN) 500 MG EC tablet Take 1 tablet (500 mg total) by mouth 2 (two) times daily with a meal.  180 tablet  3  . omeprazole (PRILOSEC) 20 MG capsule Take one 30-60 min before first meal of the day  90  capsule  3  . oxyCODONE-acetaminophen (PERCOCET) 7.5-325 MG per tablet Take 1 tablet by mouth every 4 (four) hours as needed for pain.  120 tablet  0  . predniSONE (DELTASONE) 10 MG tablet Taper as follows: 6-6-4-4-4-3-3-2-1  33 tablet  0  . Tamsulosin HCl (FLOMAX) 0.4 MG CAPS Take 1 capsule (0.4 mg total) by mouth daily.  90 capsule  3   Current Facility-Administered Medications on File Prior to Visit  Medication Dose Route Frequency Provider Last Rate Last Dose  . methylPREDNISolone acetate (DEPO-MEDROL) injection 40 mg  40 mg Intra-articular Once Ricard Dillon, MD        BP 132/80  Temp 97.8 F (36.6 C) (Oral)  Wt 206 lb (93.441 kg)  SpO2 93%      Review of Systems  Constitutional: Positive for fatigue. Negative for fever, chills and appetite change.  HENT: Negative for hearing loss, ear pain, congestion, sore throat, trouble swallowing, neck stiffness, dental problem, voice change and tinnitus.   Eyes: Negative for pain, discharge and visual disturbance.  Respiratory: Positive for shortness of breath. Negative for cough, chest tightness, wheezing and stridor.   Cardiovascular: Negative for chest pain, palpitations and leg swelling.  Gastrointestinal: Negative for nausea, vomiting, abdominal pain, diarrhea, constipation, blood in stool and abdominal distention.  Genitourinary: Negative for urgency, hematuria, flank pain, discharge, difficulty urinating and genital sores.  Musculoskeletal: Negative for myalgias, back pain, joint swelling, arthralgias and gait problem.  Skin: Negative for rash.  Neurological: Negative for dizziness, syncope, speech difficulty, weakness, numbness and headaches.  Hematological: Negative for adenopathy. Does not bruise/bleed easily.  Psychiatric/Behavioral: Negative for behavioral problems and dysphoric mood. The patient is not nervous/anxious.        Objective:   Physical Exam  Constitutional: He is oriented to person, place, and time. He  appears well-developed and well-nourished. No distress.  HENT:  Head: Normocephalic.  Right Ear: External ear normal.  Left Ear: External ear normal.  Eyes: Conjunctivae normal and EOM are normal.  Neck: Normal range of motion.  Cardiovascular: Normal rate and normal heart sounds.   Pulmonary/Chest: Effort normal. No respiratory distress. He has wheezes. He has rales.       Considerable rales rhonchi and a few scattered wheezing and involving both lower  2/3 lung fields  O2 saturation 97%  Abdominal: Bowel sounds are normal.  Musculoskeletal: Normal range of motion. He exhibits no edema and no tenderness.  Neurological: He is alert and oriented to person, place, and time.  Psychiatric: He has a normal mood and affect. His behavior is normal.          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  Discussed with patient and wife  Who  request a pulmonary referral; he is chronically symptomatic with dyspnea Diabetes mellitus. Recent aggravation do to steroid use Hypertension stable  Pulmonary referral

## 2012-07-02 NOTE — Patient Instructions (Signed)
Pulmonary referral as discussed

## 2012-07-05 ENCOUNTER — Ambulatory Visit (INDEPENDENT_AMBULATORY_CARE_PROVIDER_SITE_OTHER): Payer: Medicare HMO | Admitting: Pulmonary Disease

## 2012-07-05 ENCOUNTER — Encounter: Payer: Self-pay | Admitting: Pulmonary Disease

## 2012-07-05 VITALS — BP 140/82 | HR 112 | Temp 98.2°F | Ht 72.0 in | Wt 206.0 lb

## 2012-07-05 DIAGNOSIS — J849 Interstitial pulmonary disease, unspecified: Secondary | ICD-10-CM

## 2012-07-05 DIAGNOSIS — J8410 Pulmonary fibrosis, unspecified: Secondary | ICD-10-CM

## 2012-07-05 DIAGNOSIS — J449 Chronic obstructive pulmonary disease, unspecified: Secondary | ICD-10-CM

## 2012-07-05 LAB — PULMONARY FUNCTION TEST

## 2012-07-05 NOTE — Progress Notes (Signed)
Subjective:    Patient ID: [name], male    DOB: Apr 12, 1941, 71 y.o.   MRN: [id number]  HPI  71 yowm for FU of abnormal imaging & persistent cough & dyspnea x 3 years with recent flare.  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 boop disharge on 02 1lpm , no biopsy done Hyperkalemia >ARB and NSAIDS stopped  ESR ~98  Tapered down to 10 mg every other day in 1 mth, but he stopped this  Ct chest 6/12  Showed Diffuse bilateral alveolar and interstitial infiltrates of nonspecific etiology Was set up with O2 but  presently is using nocturnal oxygen only. 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 They have a parakeet at home x 20 y - wife cleans the cage. He was seen one week ago and treated with azithromycin for worsening chest congestion cough and shortness of breath. He  C/o increased symptomsx 3 months. can only walk 1/2 block and he is very SOB, wheezing, chest tx and congestion, cough (swallows the phlem when he can get it up) O2 satn was 90% RA     Hyperlipidemia  Hypertension  - Try off ACE August 12, 2009 for upper airway cough  Cough...................................................................................Marland KitchenWert  - Sinus ct December 18 2009 > Minor mucosal thickening at the right frontoethmoidal recess,  otherwise negative sinuses.  COPD   - PFT's December 15, 2009 FEV1 2.30 (70%) ratio 63 no better with B2 and DLC0 18.6 (73%) corrects to 106%  - Nl walking sats x 3 laps 02/17/2011  -Recruited for copd study 02/2011  Diabetes mellitus, type II  degenerative joint disease  Depression  Benign prostatic hypertrophy  Low back pain  Hyperkalemia 03/2011 >ARB and NSAIDS stopped.  Complex med regimen >med calendar 05/17/2011    Past Surgical History  Procedure Date  . Dvt 1980    Review of Systems neg for any significant sore throat, dysphagia, itching, sneezing, nasal congestion or excess/ purulent secretions, fever, chills, sweats, unintended wt loss, pleuritic or exertional cp, hempoptysis, orthopnea pnd or change in chronic leg swelling. Also denies presyncope, palpitations, heartburn, abdominal pain, nausea, vomiting, diarrhea or change in bowel or urinary habits, dysuria,hematuria, rash, arthralgias, visual complaints, headache, numbness weakness or ataxia.     Objective:   Physical Exam  Gen. Pleasant, obese, in no distress, normal affect ENT - no lesions, no post nasal drip Neck: No JVD, no thyromegaly, no carotid bruits Lungs: no use of accessory muscles, no dullness to percussion, left basal rales, no rhonchi  Cardiovascular: Rhythm regular, heart sounds  normal, no murmurs, no peripheral edema Abdomen: soft and non-tender, no hepatosplenomegaly, BS normal. Musculoskeletal: No deformities, no cyanosis or clubbing Neuro:  alert, non focal Skin:  Warm, no lesions/ rash        Assessment & Plan:

## 2012-07-05 NOTE — Patient Instructions (Signed)
Ambulatory satn Blood work today -  CBC/diff, CMET, ESR, ANA, ACE, hypersens panel CT scan of lungs Breathing test We discussed biopsy - we will decide based on above tests

## 2012-07-05 NOTE — Assessment & Plan Note (Addendum)
DD includes IPF, hypersens pneumonitis given occupational exposure & bird at home Ct is not typical for IPF - rpt High res CT Blood work today -  CBC/diff, CMET, ESR, ANA, ACE, hypersens panel CT scan of lungs Rpt PFts & compare to 2011 We discussed biopsy - we will decide based on above tests Concern that empiric steroids will worsen sugars

## 2012-07-05 NOTE — Progress Notes (Signed)
PFT done today. 

## 2012-07-05 NOTE — Assessment & Plan Note (Signed)
PFT's December 15, 2009 FEV1 2.30 (70%) ratio 63 no better with B2 and DLC0 18.6 (73%) corrects to 106%  > GOLD II -mild desatn walking  x 3 laps 9/13  Will add bronchodilators if PFts worse but this does not seem to be the main issue here

## 2012-07-09 ENCOUNTER — Ambulatory Visit (INDEPENDENT_AMBULATORY_CARE_PROVIDER_SITE_OTHER)
Admission: RE | Admit: 2012-07-09 | Discharge: 2012-07-09 | Disposition: A | Discharge status: Home / Self Care | Payer: Medicare HMO | Source: Ambulatory Visit | Attending: Pulmonary Disease | Admitting: Pulmonary Disease

## 2012-07-09 ENCOUNTER — Ambulatory Visit (INDEPENDENT_AMBULATORY_CARE_PROVIDER_SITE_OTHER): Payer: Medicare HMO | Admitting: Pulmonary Disease

## 2012-07-09 ENCOUNTER — Encounter: Payer: Self-pay | Admitting: Pulmonary Disease

## 2012-07-09 ENCOUNTER — Other Ambulatory Visit (INDEPENDENT_AMBULATORY_CARE_PROVIDER_SITE_OTHER): Payer: Medicare HMO

## 2012-07-09 VITALS — BP 150/86 | HR 120 | Temp 98.4°F | Ht 72.0 in | Wt 205.4 lb

## 2012-07-09 DIAGNOSIS — J849 Interstitial pulmonary disease, unspecified: Secondary | ICD-10-CM

## 2012-07-09 DIAGNOSIS — J8410 Pulmonary fibrosis, unspecified: Secondary | ICD-10-CM

## 2012-07-09 DIAGNOSIS — J449 Chronic obstructive pulmonary disease, unspecified: Secondary | ICD-10-CM

## 2012-07-09 LAB — COMPREHENSIVE METABOLIC PANEL
ALT: 21 U/L (ref 0–53)
BUN: 16 mg/dL (ref 6–23)
CO2: 23 mEq/L (ref 19–32)
Calcium: 8.7 mg/dL (ref 8.4–10.5)
Chloride: 105 mEq/L (ref 96–112)
Creatinine, Ser: 1 mg/dL (ref 0.4–1.5)
GFR: 79.1 mL/min (ref >60.00)
Glucose, Bld: 80 mg/dL (ref 70–99)

## 2012-07-09 LAB — CBC WITH DIFFERENTIAL/PLATELET
Basophils Absolute: 0 10*3/uL (ref 0.0–0.1)
Basophils Relative: 0.4 % (ref 0.0–3.0)
Eosinophils Relative: 0.4 % (ref 0.0–5.0)
Hemoglobin: 11.5 g/dL — ABNORMAL LOW (ref 13.0–17.0)
Lymphocytes Relative: 20.3 % (ref 12.0–46.0)
Monocytes Relative: 4.5 % (ref 3.0–12.0)
Neutro Abs: 8 10*3/uL — ABNORMAL HIGH (ref 1.4–7.7)
RBC: 4.05 Mil/uL — ABNORMAL LOW (ref 4.22–5.81)
WBC: 10.8 10*3/uL — ABNORMAL HIGH (ref 4.5–10.5)

## 2012-07-09 MED ORDER — PREDNISONE 10 MG PO TABS: ORAL_TABLET | ORAL | Status: DC

## 2012-07-09 NOTE — Patient Instructions (Addendum)
Your lung function is worse than 2011 Ct scan looks better than last year Blood work today We will check your oxygen levels at night  Trial of spiriva -sample Prednisone 20 mg x 7 days ,then 10 mg x 7days - check your sugars while taking this Stay on dulera 1 puff twice daily Albuterol 2 puffs every 6h as needed only

## 2012-07-09 NOTE — Assessment & Plan Note (Signed)
Sinus CT neg 12/19/10  PFT's March 2011 FEV1 2.30 (70%) ratio 63 no better with B2 and DLC0 18.6 (73%) corrects to 106%  > GOLD II -mild desatn walking  x 3 laps 9/13 Drop in FEv1 to 54% (1.71) 9/13 , DLCO unchanged  We will check your oxygen levels at night  Trial of spiriva -sample Prednisone 20 mg x 7 days ,then 10 mg x 7days - check your sugars while taking this Stay on dulera 1 puff twice daily Albuterol prn

## 2012-07-09 NOTE — Assessment & Plan Note (Signed)
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

## 2012-07-09 NOTE — Progress Notes (Signed)
  Subjective:    Patient ID: [name], male    DOB: Jul 24, 1941, 71 y.o.   MRN: [id number]  HPI 58 yowm for FU of abnormal imaging & persistent cough & dyspnea x 3 years with recent flare.  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 They have a parakeet at home x 20 y - wife cleans the cage.  He was seen one week ago and treated with azithromycin for worsening chest congestion cough and shortness of breath. He C/o increased symptomsx 3 months. can only walk 1/2 block and he is very SOB, wheezing, chest tx and congestion, cough (swallows the phlem when he can get it up)  O2 satn was 90% RA   PFT's March 2011 FEV1 2.30 (70%) ratio 63 no better with B2 and DLC0 18.6 (73%) corrects to 106%   RPt 9/13 showed FEv1 54% (1.71) with ratio 61 (drop), no BD response, TLC 63%, DLCO 70% (unchanged) HRCT showed diffuse fine peribronchovascular nodular pattern,  Possible superimposed acute infectious bronchiolitis in  the left upper lobe.   SOB is little better, cough but doesn't get any phlem up, wheezing, chest tx   Review of Systems neg for any significant sore throat, dysphagia, itching, sneezing, nasal congestion or excess/ purulent secretions, fever, chills, sweats, unintended wt loss, pleuritic or exertional cp, hempoptysis, orthopnea pnd or change in chronic leg swelling. Also denies presyncope, palpitations, heartburn, abdominal pain, nausea, vomiting, diarrhea or change in bowel or urinary habits, dysuria,hematuria, rash, arthralgias, visual complaints, headache, numbness weakness or ataxia.     Objective:   Physical Exam  Gen. Pleasant, well-nourished, in no distress, normal affect ENT - no lesions, no post nasal drip Neck: No JVD, no thyromegaly, no carotid bruits Lungs: no use of accessory muscles, no dullness to percussion, bibasal rales, no rhonchi  Cardiovascular: Rhythm regular, heart sounds  normal, no murmurs or gallops, no peripheral edema Abdomen: soft and non-tender, no hepatosplenomegaly, BS normal. Musculoskeletal: No deformities, no cyanosis or clubbing Neuro:  alert, non focal        Assessment & Plan:

## 2012-07-10 ENCOUNTER — Encounter: Payer: Self-pay | Admitting: Pulmonary Disease

## 2012-07-10 LAB — ANGIOTENSIN CONVERTING ENZYME: Angiotensin-Converting Enzyme: 31 U/L (ref 8–52)

## 2012-07-16 LAB — HYPERSENSITIVITY PNUEMONITIS PROFILE

## 2012-07-17 ENCOUNTER — Telehealth: Payer: Self-pay | Admitting: Pulmonary Disease

## 2012-07-17 MED ORDER — TIOTROPIUM BROMIDE MONOHYDRATE 18 MCG IN CAPS: 18.0000 ug | ORAL_CAPSULE | Freq: Every day | RESPIRATORY_TRACT | Status: DC

## 2012-07-17 NOTE — Telephone Encounter (Signed)
Pt returned call.  Advised of RA's recs as stated below.  Pt verbalized his understanding and denied any questions.  Spiriva sent to verified pharmacy.  Nothing further needed; will sign off.

## 2012-07-17 NOTE — Telephone Encounter (Signed)
lmomtcb x1 for pt 

## 2012-07-17 NOTE — Telephone Encounter (Signed)
OK to send Rx for spiriva Complete rest of prednisone

## 2012-07-17 NOTE — Telephone Encounter (Signed)
I spoke with the pt and he is calling to give an update on how he is feeling after starting spiriva. Pt states he is still having a lot of cough and congestion. He states he is unable to cough anything up. Pt states that SOB is improved as well as wheezing. He states he is not having any chest tightness. Pt states he has 6 days left of pred taper. Pt just wanted to give an update and to see if Dr. Elsworth Soho had any other recs for him. Pt aware RA out until tomorrow. Please advise.Dellwood Bing, CMA

## 2012-07-17 NOTE — Telephone Encounter (Signed)
[email or web address].Hameed Kolar, CMA  

## 2012-07-25 ENCOUNTER — Other Ambulatory Visit: Payer: Self-pay | Admitting: Internal Medicine

## 2012-07-30 ENCOUNTER — Ambulatory Visit (INDEPENDENT_AMBULATORY_CARE_PROVIDER_SITE_OTHER): Payer: Medicare HMO | Admitting: Pulmonary Disease

## 2012-07-30 ENCOUNTER — Encounter: Payer: Self-pay | Admitting: Pulmonary Disease

## 2012-07-30 VITALS — BP 122/62 | HR 101 | Temp 98.3°F | Ht 72.0 in | Wt 207.2 lb

## 2012-07-30 DIAGNOSIS — J449 Chronic obstructive pulmonary disease, unspecified: Secondary | ICD-10-CM

## 2012-07-30 DIAGNOSIS — H6090 Unspecified otitis externa, unspecified ear: Secondary | ICD-10-CM | POA: Insufficient documentation

## 2012-07-30 DIAGNOSIS — Z23 Encounter for immunization: Secondary | ICD-10-CM

## 2012-07-30 DIAGNOSIS — H60399 Other infective otitis externa, unspecified ear: Secondary | ICD-10-CM

## 2012-07-30 HISTORY — DX: Unspecified otitis externa, unspecified ear: H60.90

## 2012-07-30 NOTE — Progress Notes (Signed)
  Subjective:    Patient ID: [name], male    DOB: 10/23/1940, 71 y.o.   MRN: [id number]  HPI 48 yowm for FU of abnormal imaging & persistent cough & dyspnea x 3 years with recent flare.  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 They have a parakeet at home x 20 y - wife cleans the cage.  He was seen one week ago and treated with azithromycin for worsening chest congestion cough and shortness of breath. He C/o increased symptomsx 3 months. can only walk 1/2 block and he is very SOB, wheezing, chest tx and congestion, cough (swallows the phlem when he can get it up)  O2 satn was 90% RA  PFT's March 2011 FEV1 2.30 (70%) ratio 63 no better with B2 and DLC0 18.6 (73%) corrects to 106%  RPt 9/13 showed FEv1 54% (1.71) with ratio 61 (drop), no BD response, TLC 63%, DLCO 70% (unchanged)  HRCT showed diffuse fine peribronchovascular nodular pattern, Possible superimposed acute infectious bronchiolitis in the  left upper lobe.   07/30/2012 breathing is better, still feels like something is in his lungs, very little cough, slight wheezing, no chest tx. right ear feels congested and can't hear well. Ringing in rt ear - no dizziness or imbalance >> short pred trial x 2 wks - tolerated well, sugars ok spiriva helped  - Rx sent    Review of Systems neg for any significant sore throat, dysphagia, itching, sneezing, nasal congestion or excess/ purulent secretions, fever, chills, sweats, unintended wt loss, pleuritic or exertional cp, hempoptysis, orthopnea pnd or change in chronic leg swelling. Also denies presyncope, palpitations, heartburn, abdominal pain, nausea, vomiting, diarrhea or change in bowel or urinary habits, dysuria,hematuria, rash, arthralgias, visual complaints, headache, numbness weakness or ataxia.     Objective:   Physical Exam  Gen. Pleasant, well-nourished, in no distress ENT - purulent discharge rt ear, white exudate lining canal, no post nasal drip Neck: No JVD, no thyromegaly, no carotid bruits Lungs: no use of accessory muscles, no dullness to percussion, rt infrascapular rales, no rhonchi  Cardiovascular: Rhythm regular, heart sounds  normal, no murmurs or gallops, no peripheral edema Musculoskeletal: No deformities, no cyanosis or clubbing         Assessment & Plan:

## 2012-07-30 NOTE — Assessment & Plan Note (Signed)
Purulence rt ear with white exudate lining ear canal ENT appt

## 2012-07-30 NOTE — Assessment & Plan Note (Signed)
Flu shot STOP prednisone STay on spiriva once daily, dulera twice daily Albuterol is your rescue inhaler - use as needed only

## 2012-07-30 NOTE — Patient Instructions (Signed)
Flu shot STOP prednisone STay on spiriva once daily, dulera twice daily Albuterol is your rescue inhaler - use as needed only ENT appt

## 2012-08-20 ENCOUNTER — Telehealth: Payer: Self-pay | Admitting: Pulmonary Disease

## 2012-08-20 NOTE — Telephone Encounter (Signed)
ONO on RA - no significant edsatn - does not need O2 during sleep

## 2012-08-22 NOTE — Telephone Encounter (Signed)
lmomtcb x1 for pt 

## 2012-08-23 NOTE — Telephone Encounter (Signed)
lmomtcb x 2  

## 2012-08-24 NOTE — Telephone Encounter (Signed)
LMTCB x 3 

## 2012-08-30 ENCOUNTER — Encounter: Payer: Self-pay | Admitting: *Deleted

## 2012-08-30 NOTE — Telephone Encounter (Signed)
lmomtcb x4--mailed letter out to pt home

## 2012-09-03 ENCOUNTER — Encounter: Payer: Self-pay | Admitting: Pulmonary Disease

## 2012-09-04 ENCOUNTER — Telehealth: Payer: Self-pay | Admitting: Pulmonary Disease

## 2012-09-04 NOTE — Telephone Encounter (Signed)
See phone note 09/04/12

## 2012-09-04 NOTE — Telephone Encounter (Signed)
[name]., MD 08/20/2012 9:13 PM Signed  ONO on RA - no significant edsatn - does not need O2 during sleep  I spoke with patient about results and he verbalized understanding and had no questions

## 2012-09-12 ENCOUNTER — Other Ambulatory Visit: Payer: Self-pay | Admitting: *Deleted

## 2012-09-12 ENCOUNTER — Encounter: Payer: Self-pay | Admitting: Internal Medicine

## 2012-09-12 ENCOUNTER — Ambulatory Visit (INDEPENDENT_AMBULATORY_CARE_PROVIDER_SITE_OTHER): Payer: Medicare HMO | Admitting: Internal Medicine

## 2012-09-12 VITALS — BP 140/80 | HR 88 | Temp 98.2°F | Resp 16 | Ht 72.0 in | Wt 208.0 lb

## 2012-09-12 DIAGNOSIS — J449 Chronic obstructive pulmonary disease, unspecified: Secondary | ICD-10-CM

## 2012-09-12 DIAGNOSIS — J4489 Other specified chronic obstructive pulmonary disease: Secondary | ICD-10-CM

## 2012-09-12 DIAGNOSIS — R5381 Other malaise: Secondary | ICD-10-CM

## 2012-09-12 DIAGNOSIS — R0602 Shortness of breath: Secondary | ICD-10-CM

## 2012-09-12 MED ORDER — OXYCODONE-ACETAMINOPHEN 7.5-325 MG PO TABS: 1.0000 | ORAL_TABLET | ORAL | Status: DC | PRN

## 2012-09-12 MED ORDER — ACLIDINIUM BROMIDE 400 MCG/ACT IN AEPB: 1.0000 | INHALATION_SPRAY | Freq: Every day | RESPIRATORY_TRACT | Status: DC

## 2012-09-12 NOTE — Progress Notes (Signed)
Subjective:    Patient ID: [name], male    DOB: [birth year], 71 y.o.   MRN: [id number]  HPI  Discussed multifactorial SOB Pt has SOB with moderate activity. No reported CP. No audible wheezing. CAD history COPD HX Seeing pulmonary for interstitial lung disease Chronic pain manegement  Review of Systems  Constitutional: Positive for fatigue. Negative for fever.  HENT: Negative for hearing loss, congestion, neck pain and postnasal drip.   Eyes: Negative for discharge, redness and visual disturbance.  Respiratory: Positive for chest tightness and shortness of breath. Negative for cough and wheezing.   Cardiovascular: Negative for leg swelling.  Gastrointestinal: Negative for abdominal pain, constipation and abdominal distention.  Genitourinary: Negative for urgency and frequency.  Musculoskeletal: Positive for myalgias, back pain, joint swelling and arthralgias.  Skin: Negative for color change and rash.  Neurological: Positive for weakness. Negative for light-headedness.  Hematological: Negative for adenopathy.  Psychiatric/Behavioral: Positive for dysphoric mood. Negative for behavioral problems.   Past Medical History  Diagnosis Date  . Hyperlipidemia   . Hypertension August 12, 2009    try off ACE for upper airway cough  . Coughing December 18, 2009    Sinus CT : Minor mucosal thickening at the Right Frontoethmoidal recess, otherwise  negative sinuses  . COPD (chronic obstructive pulmonary disease) December 15, 2009    FEV1 2.30 (70%) ratio 63 no better with B2 and DLCO 18/6 (73%) corrects to 106%  . Diabetes mellitus type II   . Degenerative joint disease   . Depression   . Benign prostatic hypertrophy   . Low back pain     History   Social History  . Marital Status: Married    Spouse Name: N/A    Number of Children: N/A  . Years of Education: N/A   Occupational History  . 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 Smoking status: Former  Smoker -- 4.0 packs/day for 20 years    Types: Cigarettes    Quit date: 09/18/1984  . Smokeless tobacco: Never Used     Comment: quit in 1980  . Alcohol Use: No  . Drug Use: No  . Sexually Active: Yes   Other Topics Concern  . Not on file   Social History Narrative  . No narrative on file    Past Surgical History  Procedure Date  . Dvt 1980    Family History  Problem Relation Age of Onset  . Cancer Mother     melanoma  . Stroke Father     No Known Allergies  Current Outpatient Prescriptions on File Prior to Visit  Medication Sig Dispense Refill  . acyclovir (ZOVIRAX) 400 MG tablet Take 1 tablet (400 mg total) by mouth 2 (two) times daily.  180 tablet  3  . albuterol (PROVENTIL HFA;VENTOLIN HFA) 108 (90 BASE) MCG/ACT inhaler Inhale 2 puffs into the lungs every 4 (four) hours as needed for wheezing.  1 Inhaler  1  . amitriptyline (ELAVIL) 25 MG tablet Take 1 tablet (25 mg total) by mouth at bedtime.  90 tablet  3  . amLODipine (NORVASC) 5 MG tablet Take 1 tablet (5 mg total) by mouth daily.  90 tablet  3  . aspirin 81 MG tablet Take 81 mg by mouth daily.        . Cholecalciferol 1000 UNITS capsule Take 1 capsule (1,000 Units total) by mouth 2 (two) times daily.      . famotidine (  PEPCID) 20 MG tablet Take 1 tablet (20 mg total) by mouth at bedtime as needed for heartburn. One at bedtime  90 tablet  3  . finasteride (PROSCAR) 5 MG tablet Take 1 tablet (5 mg total) by mouth daily.  90 tablet  3  . FLUoxetine (PROZAC) 20 MG tablet Take 1 tablet (20 mg total) by mouth every morning.  90 tablet  3  . gabapentin (NEURONTIN) 300 MG capsule Take 1 capsule (300 mg total) by mouth 2 (two) times daily.  180 capsule  3  . glyBURIDE (DIABETA) 5 MG tablet Take 1 tablet (5 mg total) by mouth 2 (two) times daily.  180 tablet  3  . Multiple Vitamin (MULTIVITAMIN) capsule Take 1 capsule by mouth daily.        Marland Kitchen omeprazole (PRILOSEC) 20 MG capsule Take one 30-60 min before first meal of the  day  90 capsule  3  . Tamsulosin HCl (FLOMAX) 0.4 MG CAPS Take 1 capsule (0.4 mg total) by mouth daily.  90 capsule  3  . Aclidinium Bromide (TUDORZA PRESSAIR) 400 MCG/ACT AEPB Inhale 1 puff into the lungs daily.        BP 140/80  Pulse 88  Temp 98.2 F (36.8 C)  Resp 16  Ht 6' (1.829 m)  Wt 208 lb (94.348 kg)  BMI 28.21 kg/m2        Objective:   Physical Exam  Nursing note and vitals reviewed. Constitutional: He appears well-developed and well-nourished.  HENT:  Head: Normocephalic and atraumatic.  Eyes: Conjunctivae normal are normal. Pupils are equal, round, and reactive to light.  Neck: Normal range of motion. Neck supple.  Cardiovascular: Normal rate and regular rhythm.   Pulmonary/Chest: Effort normal. He has no wheezes. He has no rales.  Abdominal: Soft. Bowel sounds are normal.          Assessment & Plan:  pulmonary and physical rehab for multifactorial SOB centripetal obesity and need or weight loss as a controllable factor in SOB COPD drugs and therapy discussed HTN stable

## 2012-09-17 ENCOUNTER — Other Ambulatory Visit: Payer: Self-pay | Admitting: Internal Medicine

## 2012-09-17 DIAGNOSIS — G4700 Insomnia, unspecified: Secondary | ICD-10-CM

## 2012-09-17 MED ORDER — NAPROXEN 500 MG PO TBEC: 500.0000 mg | DELAYED_RELEASE_TABLET | Freq: Two times a day (BID) | ORAL | Status: DC

## 2012-09-17 NOTE — Telephone Encounter (Signed)
done

## 2012-09-17 NOTE — Telephone Encounter (Signed)
Pt needs new rx naproxen 500 mg#60 call [facility].

## 2012-10-01 ENCOUNTER — Ambulatory Visit (INDEPENDENT_AMBULATORY_CARE_PROVIDER_SITE_OTHER): Payer: Medicare HMO | Admitting: Adult Health

## 2012-10-01 ENCOUNTER — Encounter: Payer: Self-pay | Admitting: Adult Health

## 2012-10-01 VITALS — BP 136/78 | HR 114 | Temp 97.1°F | Ht 72.0 in | Wt 214.8 lb

## 2012-10-01 DIAGNOSIS — J4489 Other specified chronic obstructive pulmonary disease: Secondary | ICD-10-CM

## 2012-10-01 DIAGNOSIS — J449 Chronic obstructive pulmonary disease, unspecified: Secondary | ICD-10-CM

## 2012-10-01 NOTE — Patient Instructions (Addendum)
Keep up good work .  Continue on Tudorza Twice daily  As planned  follow up Dr. Elsworth Soho  In 6 months and As needed

## 2012-10-01 NOTE — Progress Notes (Signed)
Subjective:    Patient ID: [name], male    DOB: 1940/12/20, 71 y.o.   MRN: [id number]  HPI 71 yowm for FU of abnormal imaging & persistent cough & dyspnea x 3 years with recent flare.  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 They have a parakeet at home x 20 y - wife cleans the cage.  He was seen one week ago and treated with azithromycin for worsening chest congestion cough and shortness of breath. He C/o increased symptomsx 3 months. can only walk 1/2 block and he is very SOB, wheezing, chest tx and congestion, cough (swallows the phlem when he can get it up)  O2 satn was 90% RA  PFT's March 2011 FEV1 2.30 (70%) ratio 63 no better with B2 and DLC0 18.6 (73%) corrects to 106%  RPt 9/13 showed FEv1 54% (1.71) with ratio 61 (drop), no BD response, TLC 63%, DLCO 70% (unchanged)  HRCT showed diffuse fine peribronchovascular nodular pattern, Possible superimposed acute infectious bronchiolitis in the  left upper lobe.    breathing is better, still feels like something is in his lungs, very little cough, slight wheezing, no chest tx. right ear feels congested and can't hear well. Ringing in rt ear - no dizziness or imbalance >> short pred trial x 2 wks - tolerated well, sugars ok spiriva helped  - Rx sent   10/01/2012 Follow up  Pt reports breathing is "a lot better than it was" denies any complaints at this time No chest pain , fever or edema.  Feels better on tudorza    Review of Systems neg for any significant sore throat, dysphagia, itching, sneezing, nasal congestion or excess/ purulent secretions, fever, chills, sweats, unintended wt loss, pleuritic or exertional cp, hempoptysis, orthopnea pnd or change in chronic leg swelling. Also denies presyncope, palpitations, heartburn, abdominal pain, nausea, vomiting, diarrhea or change in bowel or urinary habits, dysuria,hematuria, rash, arthralgias, visual complaints, headache, numbness weakness or ataxia.     Objective:   Physical Exam  Gen. Pleasant, well-nourished, in no distress ENT - purulent discharge rt ear, white exudate lining canal, no post nasal drip Neck: No JVD, no thyromegaly, no carotid bruits Lungs: no use of accessory muscles, no dullness to percussion, no rhonchi  Cardiovascular: Rhythm regular, heart sounds  normal, no murmurs or gallops, no peripheral edema Musculoskeletal: No deformities, no cyanosis or clubbing         Assessment &  Plan:

## 2012-10-09 NOTE — Assessment & Plan Note (Signed)
Compensated   Plan Keep up good work .  Continue on Tudorza Twice daily  As planned  follow up Dr. Elsworth Soho  In 6 months and As needed

## 2012-11-08 ENCOUNTER — Other Ambulatory Visit: Payer: Self-pay | Admitting: *Deleted

## 2012-11-08 DIAGNOSIS — G4700 Insomnia, unspecified: Secondary | ICD-10-CM

## 2012-11-08 DIAGNOSIS — E1165 Type 2 diabetes mellitus with hyperglycemia: Secondary | ICD-10-CM

## 2012-11-08 DIAGNOSIS — N400 Enlarged prostate without lower urinary tract symptoms: Secondary | ICD-10-CM

## 2012-11-08 DIAGNOSIS — F329 Major depressive disorder, single episode, unspecified: Secondary | ICD-10-CM

## 2012-11-08 DIAGNOSIS — K219 Gastro-esophageal reflux disease without esophagitis: Secondary | ICD-10-CM

## 2012-11-08 DIAGNOSIS — I10 Essential (primary) hypertension: Secondary | ICD-10-CM

## 2012-11-08 MED ORDER — FINASTERIDE 5 MG PO TABS: 5.0000 mg | ORAL_TABLET | Freq: Every day | ORAL | Status: DC

## 2012-11-08 MED ORDER — AMLODIPINE BESYLATE 5 MG PO TABS: 5.0000 mg | ORAL_TABLET | Freq: Every day | ORAL | Status: DC

## 2012-11-08 MED ORDER — AMITRIPTYLINE HCL 25 MG PO TABS: 25.0000 mg | ORAL_TABLET | Freq: Every day | ORAL | Status: DC

## 2012-11-08 MED ORDER — OMEPRAZOLE 20 MG PO CPDR: DELAYED_RELEASE_CAPSULE | ORAL | Status: DC

## 2012-11-08 MED ORDER — GLYBURIDE-METFORMIN 2.5-500 MG PO TABS: 1.0000 | ORAL_TABLET | Freq: Every day | ORAL | Status: DC

## 2012-11-08 MED ORDER — ACYCLOVIR 400 MG PO TABS: 400.0000 mg | ORAL_TABLET | Freq: Two times a day (BID) | ORAL | Status: DC

## 2012-11-08 MED ORDER — GLYBURIDE 5 MG PO TABS: 5.0000 mg | ORAL_TABLET | Freq: Two times a day (BID) | ORAL | Status: DC

## 2012-11-08 MED ORDER — FAMOTIDINE 20 MG PO TABS: 20.0000 mg | ORAL_TABLET | Freq: Every evening | ORAL | Status: DC | PRN

## 2012-11-08 MED ORDER — TAMSULOSIN HCL 0.4 MG PO CAPS: 0.4000 mg | ORAL_CAPSULE | Freq: Every day | ORAL | Status: DC

## 2012-11-08 MED ORDER — GABAPENTIN 300 MG PO CAPS: 300.0000 mg | ORAL_CAPSULE | Freq: Two times a day (BID) | ORAL | Status: DC

## 2012-11-08 MED ORDER — NAPROXEN 500 MG PO TBEC: 500.0000 mg | DELAYED_RELEASE_TABLET | Freq: Two times a day (BID) | ORAL | Status: DC

## 2012-11-08 MED ORDER — FLUOXETINE HCL 20 MG PO TABS: 20.0000 mg | ORAL_TABLET | ORAL | Status: DC

## 2012-11-19 ENCOUNTER — Other Ambulatory Visit: Payer: Self-pay | Admitting: *Deleted

## 2012-11-19 DIAGNOSIS — N400 Enlarged prostate without lower urinary tract symptoms: Secondary | ICD-10-CM

## 2012-11-19 DIAGNOSIS — G4700 Insomnia, unspecified: Secondary | ICD-10-CM

## 2012-11-19 DIAGNOSIS — I10 Essential (primary) hypertension: Secondary | ICD-10-CM

## 2012-11-19 DIAGNOSIS — K219 Gastro-esophageal reflux disease without esophagitis: Secondary | ICD-10-CM

## 2012-11-19 DIAGNOSIS — F329 Major depressive disorder, single episode, unspecified: Secondary | ICD-10-CM

## 2012-11-19 MED ORDER — GABAPENTIN 300 MG PO CAPS: 300.0000 mg | ORAL_CAPSULE | Freq: Two times a day (BID) | ORAL | Status: DC

## 2012-11-19 MED ORDER — FINASTERIDE 5 MG PO TABS: 5.0000 mg | ORAL_TABLET | Freq: Every day | ORAL | Status: DC

## 2012-11-19 MED ORDER — OMEPRAZOLE 20 MG PO CPDR: DELAYED_RELEASE_CAPSULE | ORAL | Status: DC

## 2012-11-19 MED ORDER — TAMSULOSIN HCL 0.4 MG PO CAPS: 0.4000 mg | ORAL_CAPSULE | Freq: Every day | ORAL | Status: DC

## 2012-11-19 MED ORDER — FAMOTIDINE 20 MG PO TABS: 20.0000 mg | ORAL_TABLET | Freq: Every evening | ORAL | Status: DC | PRN

## 2012-11-19 MED ORDER — NAPROXEN 500 MG PO TBEC: 500.0000 mg | DELAYED_RELEASE_TABLET | Freq: Two times a day (BID) | ORAL | Status: AC

## 2012-11-19 MED ORDER — AMITRIPTYLINE HCL 25 MG PO TABS: 25.0000 mg | ORAL_TABLET | Freq: Every day | ORAL | Status: DC

## 2012-11-19 MED ORDER — ACYCLOVIR 400 MG PO TABS: 400.0000 mg | ORAL_TABLET | Freq: Two times a day (BID) | ORAL | Status: DC

## 2012-11-19 MED ORDER — AMLODIPINE BESYLATE 5 MG PO TABS: 5.0000 mg | ORAL_TABLET | Freq: Every day | ORAL | Status: DC

## 2012-11-19 MED ORDER — FLUOXETINE HCL 20 MG PO TABS: 20.0000 mg | ORAL_TABLET | ORAL | Status: DC

## 2012-12-14 ENCOUNTER — Ambulatory Visit: Payer: Medicare HMO | Admitting: Internal Medicine

## 2012-12-17 ENCOUNTER — Telehealth: Payer: Self-pay | Admitting: Internal Medicine

## 2012-12-17 MED ORDER — OXYCODONE-ACETAMINOPHEN 7.5-325 MG PO TABS: 1.0000 | ORAL_TABLET | ORAL | Status: DC | PRN

## 2012-12-17 NOTE — Telephone Encounter (Signed)
rx up front ready for p/u, pt aware 

## 2012-12-17 NOTE — Telephone Encounter (Signed)
Patient came in stating that he need a refill of his oxycodone 7.5-3.25mg  1 poq 4 hrs prn for pain. Please assist.

## 2012-12-19 ENCOUNTER — Telehealth: Payer: Self-pay | Admitting: Pulmonary Disease

## 2012-12-19 NOTE — Telephone Encounter (Signed)
I do have the cmn, left a msg for [employer] @ [facility] informed once signed by Dr Elsworth Soho, will fax back to the local [place] # [phone]. That is who we correspond with. Verdie Mosher

## 2013-01-10 ENCOUNTER — Telehealth: Payer: Self-pay | Admitting: Internal Medicine

## 2013-01-10 NOTE — Telephone Encounter (Signed)
Patient called stating that he need a refill of his oxycodone 7.5-325mg  1poq 4 hrs prn for pain. Please assist.

## 2013-01-11 ENCOUNTER — Other Ambulatory Visit: Payer: Self-pay | Admitting: *Deleted

## 2013-01-11 MED ORDER — OXYCODONE-ACETAMINOPHEN 7.5-325 MG PO TABS: 1.0000 | ORAL_TABLET | ORAL | Status: DC | PRN

## 2013-01-11 NOTE — Telephone Encounter (Signed)
Printed an d will call pt to pick after dr Arnoldo Morale signs

## 2013-02-12 ENCOUNTER — Telehealth: Payer: Self-pay | Admitting: Internal Medicine

## 2013-02-12 ENCOUNTER — Telehealth: Payer: Self-pay | Admitting: Pulmonary Disease

## 2013-02-12 MED ORDER — OXYCODONE-ACETAMINOPHEN 7.5-325 MG PO TABS: 1.0000 | ORAL_TABLET | ORAL | Status: DC | PRN

## 2013-02-12 NOTE — Telephone Encounter (Signed)
Pt needs refill of oxyCODONE-acetaminophen (PERCOCET) 7.5-325 MG per tablet

## 2013-02-12 NOTE — Telephone Encounter (Signed)
Printed and will call pt tomorrow after dr Arnoldo Morale signs, so pt can come pikc up

## 2013-02-12 NOTE — Telephone Encounter (Signed)
This form has been placed in RA look at. Will forward to him so he is aware.

## 2013-02-14 NOTE — Telephone Encounter (Signed)
Did we prescribe the O2 ? I see ONO did not show desatn He does not seem to need this now - can bring him back for another check to see if he qualifies

## 2013-02-14 NOTE — Telephone Encounter (Signed)
lmomtcb x1 for pt 

## 2013-02-18 NOTE — Telephone Encounter (Signed)
LMTCB

## 2013-02-18 NOTE — Telephone Encounter (Signed)
Per pt's chart, he was started on O2 during 04/2011 hospitalization CMN recently sign by RA 3.24.14 Per pt's chart, has been wearing his O2 nocturnally  LMOM TCB x2

## 2013-02-18 NOTE — Telephone Encounter (Signed)
Pt returned call and can be reached @ [phone]. Dion Saucier

## 2013-02-18 NOTE — Telephone Encounter (Signed)
Pt returned call. Per Magda Paganini I have scheduled pt to see TP 02-22-13. Nothing further needed at this time. Mariann Laster

## 2013-02-22 ENCOUNTER — Ambulatory Visit (INDEPENDENT_AMBULATORY_CARE_PROVIDER_SITE_OTHER): Payer: Medicare Other | Admitting: Adult Health

## 2013-02-22 ENCOUNTER — Ambulatory Visit (INDEPENDENT_AMBULATORY_CARE_PROVIDER_SITE_OTHER): Payer: Medicare Other

## 2013-02-22 DIAGNOSIS — J449 Chronic obstructive pulmonary disease, unspecified: Secondary | ICD-10-CM

## 2013-02-22 DIAGNOSIS — J849 Interstitial pulmonary disease, unspecified: Secondary | ICD-10-CM

## 2013-02-25 ENCOUNTER — Telehealth: Payer: Self-pay | Admitting: Pulmonary Disease

## 2013-02-25 DIAGNOSIS — J441 Chronic obstructive pulmonary disease with (acute) exacerbation: Secondary | ICD-10-CM

## 2013-02-25 NOTE — Telephone Encounter (Signed)
Pt states came in for nurse visit to qualify for o2 on 5/16 and then the triage nurse called and left him a msg I do not see where we called him though Jes, did you call him? Please advise thanks

## 2013-02-25 NOTE — Telephone Encounter (Signed)
I have not called pt nor JJ.  lmtcb x1

## 2013-02-25 NOTE — Assessment & Plan Note (Signed)
Error encounter -void

## 2013-02-25 NOTE — Telephone Encounter (Signed)
LMTCB

## 2013-02-25 NOTE — Telephone Encounter (Signed)
Pl look into

## 2013-02-25 NOTE — Progress Notes (Signed)
Error

## 2013-02-26 DIAGNOSIS — J8410 Pulmonary fibrosis, unspecified: Secondary | ICD-10-CM

## 2013-02-26 DIAGNOSIS — J449 Chronic obstructive pulmonary disease, unspecified: Secondary | ICD-10-CM

## 2013-02-27 NOTE — Telephone Encounter (Signed)
lmtcb x1 

## 2013-02-27 NOTE — Telephone Encounter (Signed)
Called, spoke with pt.  Informed him of below.  Pt states he was calling to see what we have figured out about his o2.   Pt has recently changed insurance companies and reports he had to come in to requalify for o2 for the new co. Pt came in for pulse oximetry walk on Friday, May 16.  Walk done on RA - pt completely all 3 laps with no desats  per pulse ox flowsheet.  Results in chart.  Dr. Elsworth Soho, will you pls look at these results and advise on recs as pt was coming in to requalify for o2 for new insurance co.  Thank you .

## 2013-02-27 NOTE — Telephone Encounter (Signed)
Seems like he has improved & does not need O2 any more - can dc o2 Pl schedule OV few wks after to see how he is doing without O2.

## 2013-02-28 NOTE — Telephone Encounter (Signed)
ATC x 2 line busy. WCB. [place] Bing, CMA

## 2013-02-28 NOTE — Telephone Encounter (Signed)
I spoke with the pt and he states that he has gotten a bill for his oxygen since Jan 2014 when he changed insurances. Per phone note the pt was brought in to qualify for oxygen which he did not. Also an ONO was done on 08-2012 and pt did not qualify for nocturnal oxygen either. Pt is upset that he is going to have to pay for oxygen for the last 5 months. He states it is because we have not given the company the information they needed. I advised that we did provide CMN but that he  does not qualify for oxygen and because he changed insurances they are requiring this new information. Pt states he has been using oxygen for 2 years, why now are they saying he has not qualified?  I explained at the time he was given the oxygen in the hospital he did qualify, but with the most recent tests his oxygen does not drop.  Pt states he has a pulse ox at home and when he is working outside he will come in a check his oxygen and it will be 93% so he will placed oxygen on and it will increase to 96% so he then stops the oxygen . I advised the pt that a saturations of 93% is good and will not qualify him for the oxygen. Pt insist that there is something we can do to have the insurance pay for the past 5 months of oxygen. Pt states he is ok with discontinuing the oxygen after the past 5 months are paid for. I advised the pt that I am not sure what else we can do but I will check with [facility] to see if they have any ideas. [facility] can you please check into this to see if there is anything that Dr. Elsworth Soho can do to help get oxygen covered for this year? [place] Bing, CMA

## 2013-03-01 NOTE — Telephone Encounter (Signed)
i do not know of anything it sounds like it has all been done if he don't qualify his ins simply will not pay for it

## 2013-03-01 NOTE — Telephone Encounter (Signed)
.  spoke with pt gave him Dr Elsworth Soho rec's  Of o2 to be Dc'd will send order over to [facility]

## 2013-03-01 NOTE — Telephone Encounter (Signed)
Pt returned triage's call.  Holly D Pryor ° °

## 2013-03-01 NOTE — Telephone Encounter (Signed)
Left message with pt's spouse TCB x1 Pt needs to be informed that insurance will not back pay him for his O2 from Jan 1 to now and that because he does not qualify for O2, this can be d/c'd w/ order to be sent.

## 2013-03-01 NOTE — Telephone Encounter (Signed)
lmomtcb x1 

## 2013-03-07 ENCOUNTER — Telehealth: Payer: Self-pay | Admitting: Pulmonary Disease

## 2013-03-07 DIAGNOSIS — J849 Interstitial pulmonary disease, unspecified: Secondary | ICD-10-CM

## 2013-03-07 NOTE — Telephone Encounter (Signed)
Pt returned call.  Advised pt that order was sent.  Pt verbalized understanding & stated nothing further needed at this time.  [name]

## 2013-03-07 NOTE — Telephone Encounter (Signed)
lmomtcb x1 for pt--order sent

## 2013-03-11 ENCOUNTER — Other Ambulatory Visit: Payer: Self-pay | Admitting: *Deleted

## 2013-03-11 ENCOUNTER — Ambulatory Visit (INDEPENDENT_AMBULATORY_CARE_PROVIDER_SITE_OTHER): Payer: Medicare Other | Admitting: Internal Medicine

## 2013-03-11 ENCOUNTER — Encounter: Payer: Self-pay | Admitting: Internal Medicine

## 2013-03-11 VITALS — BP 130/80 | HR 84 | Temp 98.2°F | Resp 16 | Ht 72.0 in | Wt 212.0 lb

## 2013-03-11 DIAGNOSIS — K219 Gastro-esophageal reflux disease without esophagitis: Secondary | ICD-10-CM

## 2013-03-11 DIAGNOSIS — J449 Chronic obstructive pulmonary disease, unspecified: Secondary | ICD-10-CM

## 2013-03-11 DIAGNOSIS — M1710 Unilateral primary osteoarthritis, unspecified knee: Secondary | ICD-10-CM

## 2013-03-11 DIAGNOSIS — E119 Type 2 diabetes mellitus without complications: Secondary | ICD-10-CM

## 2013-03-11 DIAGNOSIS — G4700 Insomnia, unspecified: Secondary | ICD-10-CM

## 2013-03-11 MED ORDER — OXYCODONE-ACETAMINOPHEN 7.5-325 MG PO TABS: 1.0000 | ORAL_TABLET | ORAL | Status: DC | PRN

## 2013-03-11 MED ORDER — AMITRIPTYLINE HCL 50 MG PO TABS: 50.0000 mg | ORAL_TABLET | Freq: Every day | ORAL | Status: DC

## 2013-03-11 MED ORDER — METHYLPREDNISOLONE ACETATE 40 MG/ML IJ SUSP: 40.0000 mg | Freq: Once | INTRAMUSCULAR | Status: DC

## 2013-03-11 NOTE — Addendum Note (Signed)
Addended by: Ricard Dillon on: 03/11/2013 03:41 PM   Modules accepted: Orders

## 2013-03-11 NOTE — Patient Instructions (Signed)
You can take 2 of the 25 mg amitriptyline until you run out and a new prescription will be for 50 mg at bedtime

## 2013-03-11 NOTE — Progress Notes (Signed)
Subjective:    Patient ID: [name], male    DOB: August 01, 1941, 72 y.o.   MRN: [id number]  HPI Back pain and knee pain Weight loss Last office visit 6 months Has knee pain Neuropathy in feet will awaken him from sleep Hx of DM    Review of Systems  Respiratory: Positive for shortness of breath.   Cardiovascular: Positive for palpitations and leg swelling.  Musculoskeletal: Positive for myalgias, joint swelling and gait problem.   Past Medical History  Diagnosis Date  . Hyperlipidemia   . Hypertension August 12, 2009    try off ACE for upper airway cough  . Coughing December 18, 2009    Sinus CT : Minor mucosal thickening at the Right Frontoethmoidal recess, otherwise  negative sinuses  . COPD (chronic obstructive pulmonary disease) December 15, 2009    FEV1 2.30 (70%) ratio 63 no better with B2 and DLCO 18/6 (73%) corrects to 106%  . Diabetes mellitus type II   . Degenerative joint disease   . Depression   . Benign prostatic hypertrophy   . Low back pain     History   Social History  . Marital Status: Married    Spouse Name: N/A    Number of Children: N/A  . Years of Education: N/A   Occupational History  . 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 Smoking status: Former Smoker -- 4.00 packs/day for 20 years    Types: Cigarettes    Quit date: 09/18/1984  . Smokeless tobacco: Never Used     Comment: quit in 1980  . Alcohol Use: No  . Drug Use: No  . Sexually Active: Yes   Other Topics Concern  . Not on file   Social History Narrative  . No narrative on file    Past Surgical History  Procedure Laterality Date  . Dvt  1980    Family History  Problem Relation Age of Onset  . Cancer Mother     melanoma  . Stroke Father     No Known Allergies  Current Outpatient Prescriptions on File Prior to Visit  Medication Sig Dispense Refill  . Aclidinium Bromide (TUDORZA PRESSAIR) 400 MCG/ACT AEPB Inhale 1 puff into the lungs daily.       Marland Kitchen acyclovir (ZOVIRAX) 400 MG tablet Take 1 tablet (400 mg total) by mouth 2 (two) times daily.  180 tablet  3  . albuterol (PROVENTIL HFA;VENTOLIN HFA) 108 (90 BASE) MCG/ACT inhaler Inhale 2 puffs into the lungs every 4 (four) hours as needed for wheezing.  1 Inhaler  1  . aspirin 81 MG tablet Take 81 mg by mouth daily.        . Cholecalciferol 1000 UNITS capsule Take 1 capsule (1,000 Units total) by mouth 2 (two) times daily.      Marland Kitchen glyBURIDE (DIABETA) 5 MG tablet Take 1 tablet (5 mg total) by mouth 2 (two) times daily.  180 tablet  3  . glyBURIDE-metformin (GLUCOVANCE) 2.5-500 MG per tablet Take 1 tablet by mouth daily with breakfast.  90 tablet  3  . Multiple Vitamin (MULTIVITAMIN) capsule Take 1 capsule by mouth daily.        . naproxen (EC-NAPROSYN) 500 MG EC tablet Take 1 tablet (500 mg total) by mouth 2 (two) times daily with a meal.  180 tablet  3  . omeprazole (PRILOSEC) 20 MG capsule Take one 30-60 min before first meal of the day  90  capsule  3  . amitriptyline (ELAVIL) 25 MG tablet Take 1 tablet (25 mg total) by mouth at bedtime.  90 tablet  3  . amLODipine (NORVASC) 5 MG tablet Take 1 tablet (5 mg total) by mouth daily.  90 tablet  3  . famotidine (PEPCID) 20 MG tablet Take 1 tablet (20 mg total) by mouth at bedtime as needed for heartburn. One at bedtime  90 tablet  3  . finasteride (PROSCAR) 5 MG tablet Take 1 tablet (5 mg total) by mouth daily.  90 tablet  3  . FLUoxetine (PROZAC) 20 MG tablet Take 1 tablet (20 mg total) by mouth every morning.  90 tablet  3  . gabapentin (NEURONTIN) 300 MG capsule Take 1 capsule (300 mg total) by mouth 2 (two) times daily.  180 capsule  3  . Tamsulosin HCl (FLOMAX) 0.4 MG CAPS Take 1 capsule (0.4 mg total) by mouth daily.  90 capsule  3   No current facility-administered medications on file prior to visit.    BP 130/80  Pulse 84  Temp(Src) 98.2 F (36.8 C)  Resp 16  Ht 6' (1.829 m)  Wt 212 lb (96.163 kg)  BMI 28.75 kg/m2         Objective:   Physical Exam  Nursing note and vitals reviewed. Constitutional: He appears well-developed and well-nourished.  HENT:  Head: Normocephalic and atraumatic.  Eyes: Conjunctivae are normal. Pupils are equal, round, and reactive to light.  Neck: Normal range of motion. Neck supple.  Cardiovascular: Normal rate and regular rhythm.   Pulmonary/Chest: Effort normal and breath sounds normal.  Abdominal: Soft. Bowel sounds are normal.  Musculoskeletal: He exhibits edema and tenderness.  Neurological: He displays normal reflexes. Coordination abnormal.  Skin: Skin is warm and dry.          Assessment & Plan:  Knee pain. Inject with depo  Informed consent obtained and the patient's knee was prepped with betadine. Local anesthesia was obtained with topical spray. Then 40 mg of Depo-Medrol and 1/2 cc of lidocaine was injected into the joint space. The patient tolerated the procedure without complications. Post injection care discussed with patient.   refill pain medication  Neuropathy increased will elavil to 50

## 2013-04-04 ENCOUNTER — Ambulatory Visit (INDEPENDENT_AMBULATORY_CARE_PROVIDER_SITE_OTHER): Payer: Medicare Other | Admitting: Pulmonary Disease

## 2013-04-04 ENCOUNTER — Encounter: Payer: Self-pay | Admitting: Pulmonary Disease

## 2013-04-04 ENCOUNTER — Ambulatory Visit (INDEPENDENT_AMBULATORY_CARE_PROVIDER_SITE_OTHER)
Admission: RE | Admit: 2013-04-04 | Discharge: 2013-04-04 | Disposition: A | Discharge status: Home / Self Care | Payer: Medicare Other | Source: Ambulatory Visit | Attending: Pulmonary Disease | Admitting: Pulmonary Disease

## 2013-04-04 VITALS — BP 130/72 | HR 94 | Temp 98.0°F | Ht 72.0 in | Wt 211.8 lb

## 2013-04-04 DIAGNOSIS — J8410 Pulmonary fibrosis, unspecified: Secondary | ICD-10-CM

## 2013-04-04 DIAGNOSIS — J849 Interstitial pulmonary disease, unspecified: Secondary | ICD-10-CM

## 2013-04-04 NOTE — Assessment & Plan Note (Addendum)
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

## 2013-04-04 NOTE — Progress Notes (Signed)
  Subjective:    Patient ID: [name], male    DOB: 1941/09/04, 72 y.o.   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  CXr showed persistent interstitial prominence esp on left  He has a significant environmental exposure history to Asbestos and has also worked in a Equities trader x 20 y. He has workshop at home ,does [employer] vintage cars. They have a parakeet at home x 20 y - wife cleans the cage.  O2 satn was 90% RA  PFT's March 2011 FEV1 2.30 (70%) ratio 63 no better with B2 and DLC0 18.6 (73%) corrects to 106%  RPt 9/13 showed FEv1 54% (1.71) with ratio 61 (drop), no BD response, TLC 63%, DLCO 70% (unchanged)  HRCT showed diffuse fine peribronchovascular nodular pattern, Possible superimposed acute infectious bronchiolitis in the left upper lobe.   >> short pred trial x 2 wks - tolerated well, sugars ok  spiriva helped - Rx sent    04/04/2013 46m FU  Pt states he is still SOB w/ any exertion if he is in a hurry but his O2 sats are fine. Has  very little dry cough, denies wheezing, O2 satn stays at 93% on walking Feels well overall Spiriva was stopped, he Has stopped taking tudorza- ineffective  Not taking albuterol either  Review of Systems neg for any significant sore throat, dysphagia, itching, sneezing, nasal congestion or excess/ purulent secretions, fever, chills, sweats, unintended wt loss, pleuritic or exertional cp, hempoptysis, orthopnea pnd or change in chronic leg swelling. Also denies presyncope, palpitations, heartburn, abdominal pain, nausea, vomiting, diarrhea or change in bowel or urinary habits, dysuria,hematuria, rash, arthralgias, visual complaints, headache, numbness weakness or ataxia.     Objective:   Physical Exam  Gen. Pleasant, well-nourished, in no distress ENT - no lesions, no post nasal drip Neck: No JVD, no thyromegaly, no carotid bruits Lungs: no use of accessory muscles, no dullness to percussion, clear without rales or rhonchi  Cardiovascular: Rhythm regular, heart sounds  normal, no murmurs or gallops, no peripheral edema Musculoskeletal: No deformities, no cyanosis or clubbing        Assessment & Plan:

## 2013-04-04 NOTE — Patient Instructions (Addendum)
CXR today Schedule PFTs

## 2013-04-10 DIAGNOSIS — Z961 Presence of intraocular lens: Secondary | ICD-10-CM | POA: Insufficient documentation

## 2013-04-10 DIAGNOSIS — Z947 Corneal transplant status: Secondary | ICD-10-CM | POA: Insufficient documentation

## 2013-06-18 ENCOUNTER — Telehealth: Payer: Self-pay | Admitting: Internal Medicine

## 2013-06-18 MED ORDER — OXYCODONE-ACETAMINOPHEN 7.5-325 MG PO TABS: 1.0000 | ORAL_TABLET | ORAL | Status: DC | PRN

## 2013-06-18 NOTE — Telephone Encounter (Signed)
Patient calling for a refill rx on his oxyCODONE-acetaminophen (PERCOCET) 7.5-325 MG per tablet He will be in [place] tomorrow and would like to pick up then if he can. Please call when ready.

## 2013-06-18 NOTE — Telephone Encounter (Signed)
Wife informed ready for pick up

## 2013-06-19 DIAGNOSIS — H26499 Other secondary cataract, unspecified eye: Secondary | ICD-10-CM | POA: Insufficient documentation

## 2013-07-01 ENCOUNTER — Encounter: Payer: Self-pay | Admitting: Internal Medicine

## 2013-07-01 ENCOUNTER — Ambulatory Visit (INDEPENDENT_AMBULATORY_CARE_PROVIDER_SITE_OTHER): Payer: Medicare Other | Admitting: Internal Medicine

## 2013-07-01 VITALS — BP 150/84 | HR 88 | Temp 97.9°F | Resp 18 | Ht 71.0 in | Wt 214.0 lb

## 2013-07-01 DIAGNOSIS — I10 Essential (primary) hypertension: Secondary | ICD-10-CM

## 2013-07-01 DIAGNOSIS — J449 Chronic obstructive pulmonary disease, unspecified: Secondary | ICD-10-CM

## 2013-07-01 DIAGNOSIS — E119 Type 2 diabetes mellitus without complications: Secondary | ICD-10-CM

## 2013-07-01 DIAGNOSIS — Z23 Encounter for immunization: Secondary | ICD-10-CM

## 2013-07-01 LAB — BASIC METABOLIC PANEL
BUN: 15 mg/dL (ref 6–23)
Calcium: 9.1 mg/dL (ref 8.4–10.5)
GFR: 69.85 mL/min (ref >60.00)
Glucose, Bld: 182 mg/dL — ABNORMAL HIGH (ref 70–99)

## 2013-07-01 MED ORDER — OXYCODONE-ACETAMINOPHEN 7.5-325 MG PO TABS: 1.0000 | ORAL_TABLET | ORAL | Status: DC | PRN

## 2013-07-01 NOTE — Progress Notes (Signed)
Subjective:    Patient ID: [name], male    DOB: 1941-06-03, 72 y.o.   MRN: [id number]  HPI Patient is a 72 year old male followed for diabetes hypertension hyperlipidemia and COPD followed by our pulmonary department.  He presents today for followup of his blood pressure and diabetes his sugars at home and been ranging 140 to 130   And the last a1c was 6.5 Back pain stable Knee pain improved after last shot    Review of Systems  Respiratory: Positive for cough. Negative for shortness of breath.   Cardiovascular: Negative for chest pain and leg swelling.  Gastrointestinal: Positive for abdominal distention. Negative for abdominal pain.  Musculoskeletal: Positive for joint swelling and gait problem.       Knee pain  Neurological: Positive for weakness.  Psychiatric/Behavioral: Positive for dysphoric mood.   Past Medical History  Diagnosis Date  . Hyperlipidemia   . Hypertension August 12, 2009    try off ACE for upper airway cough  . Coughing December 18, 2009    Sinus CT : Minor mucosal thickening at the Right Frontoethmoidal recess, otherwise  negative sinuses  . COPD (chronic obstructive pulmonary disease) December 15, 2009    FEV1 2.30 (70%) ratio 63 no better with B2 and DLCO 18/6 (73%) corrects to 106%  . Diabetes mellitus type II   . Degenerative joint disease   . Depression   . Benign prostatic hypertrophy   . Low back pain     History   Social History  . Marital Status: Married    Spouse Name: N/A    Number of Children: N/A  . Years of Education: N/A   Occupational History  . 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 Smoking status: Former Smoker -- 4.00 packs/day for 20 years    Types: Cigarettes    Quit date: 09/18/1984  . Smokeless tobacco: Never Used     Comment: quit in 1980  . Alcohol Use: No  . Drug Use: No  . Sexual Activity: Yes   Other Topics Concern  . Not on file   Social History Narrative  . No narrative on file     Past Surgical History  Procedure Laterality Date  . Dvt  1980    Family History  Problem Relation Age of Onset  . Cancer Mother     melanoma  . Stroke Father     No Known Allergies  Current Outpatient Prescriptions on File Prior to Visit  Medication Sig Dispense Refill  . acyclovir (ZOVIRAX) 400 MG tablet Take 1 tablet (400 mg total) by mouth 2 (two) times daily.  180 tablet  3  . amitriptyline (ELAVIL) 50 MG tablet Take 1 tablet (50 mg total) by mouth at bedtime.  90 tablet  3  . amLODipine (NORVASC) 5 MG tablet Take 1 tablet (5 mg total) by mouth daily.  90 tablet  3  . aspirin 81 MG tablet Take 81 mg by mouth daily.        . Cholecalciferol 1000 UNITS capsule Take 1 capsule (1,000 Units total) by mouth 2 (two) times daily.      . famotidine (PEPCID) 20 MG tablet Take 1 tablet (20 mg total) by mouth at bedtime as needed for heartburn. One at bedtime  90 tablet  3  . finasteride (PROSCAR) 5 MG tablet Take 1 tablet (5 mg total) by mouth daily.  90 tablet  3  . FLUoxetine (PROZAC) 20  MG tablet Take 1 tablet (20 mg total) by mouth every morning.  90 tablet  3  . gabapentin (NEURONTIN) 300 MG capsule Take 1 capsule (300 mg total) by mouth 2 (two) times daily.  180 capsule  3  . glyBURIDE-metformin (GLUCOVANCE) 2.5-500 MG per tablet Take 1 tablet by mouth daily with breakfast.  90 tablet  3  . Multiple Vitamin (MULTIVITAMIN) capsule Take 1 capsule by mouth daily.        . naproxen (EC-NAPROSYN) 500 MG EC tablet Take 1 tablet (500 mg total) by mouth 2 (two) times daily with a meal.  180 tablet  3  . omeprazole (PRILOSEC) 20 MG capsule Take one 30-60 min before first meal of the day  90 capsule  3  . oxyCODONE-acetaminophen (PERCOCET) 7.5-325 MG per tablet Take 1 tablet by mouth every 4 (four) hours as needed for pain.  120 tablet  0  . Tamsulosin HCl (FLOMAX) 0.4 MG CAPS Take 1 capsule (0.4 mg total) by mouth daily.  90 capsule  3   No current facility-administered medications on  file prior to visit.    BP 150/84  Pulse 88  Temp(Src) 97.9 F (36.6 C)  Resp 18  Ht 5\' 11"  (1.803 m)  Wt 214 lb (97.07 kg)  BMI 29.86 kg/m2       Objective:   Physical Exam  Nursing note reviewed. Constitutional: He appears well-developed and well-nourished.  HENT:  Head: Normocephalic and atraumatic.  Eyes: Conjunctivae and EOM are normal.  Neck: Neck supple.  Cardiovascular: Regular rhythm.   Murmur heard. Pulmonary/Chest: Effort normal and breath sounds normal.  Abdominal: Bowel sounds are normal.  Musculoskeletal: Normal range of motion. He exhibits edema.  Psychiatric: He has a normal mood and affect. His behavior is normal.          Assessment & Plan:  Back pain Stable  Breathing stable followed by pulmonary  DM Check A12c and no change in current medications

## 2013-07-01 NOTE — Patient Instructions (Signed)
The patient is instructed to continue all medications as prescribed. Schedule followup with check out clerk upon leaving the clinic  

## 2013-07-25 ENCOUNTER — Ambulatory Visit (INDEPENDENT_AMBULATORY_CARE_PROVIDER_SITE_OTHER): Payer: Medicare Other | Admitting: Pulmonary Disease

## 2013-07-25 DIAGNOSIS — J849 Interstitial pulmonary disease, unspecified: Secondary | ICD-10-CM

## 2013-07-25 DIAGNOSIS — J449 Chronic obstructive pulmonary disease, unspecified: Secondary | ICD-10-CM

## 2013-07-25 DIAGNOSIS — J8410 Pulmonary fibrosis, unspecified: Secondary | ICD-10-CM

## 2013-07-25 NOTE — Progress Notes (Signed)
PFT done today. 

## 2013-08-23 ENCOUNTER — Telehealth: Payer: Self-pay | Admitting: Pulmonary Disease

## 2013-08-23 NOTE — Telephone Encounter (Signed)
PFTs 10/14 sable to slight improved from last yr

## 2013-08-23 NOTE — Telephone Encounter (Signed)
lmomtcb x1 for pt 

## 2013-08-26 NOTE — Telephone Encounter (Signed)
Returning call can be reached at [phone].[name]

## 2013-08-26 NOTE — Telephone Encounter (Signed)
Spoke with pt and notified of results per Dr. Alva. Pt verbalized understanding and denied any questions. 

## 2013-08-26 NOTE — Telephone Encounter (Signed)
lmomtcb x 2  

## 2013-09-03 ENCOUNTER — Telehealth: Payer: Self-pay | Admitting: Internal Medicine

## 2013-09-03 ENCOUNTER — Encounter: Payer: Self-pay | Admitting: *Deleted

## 2013-09-03 ENCOUNTER — Other Ambulatory Visit: Payer: Self-pay | Admitting: *Deleted

## 2013-09-03 MED ORDER — OXYCODONE-ACETAMINOPHEN 7.5-325 MG PO TABS: 1.0000 | ORAL_TABLET | ORAL | Status: DC | PRN

## 2013-09-03 NOTE — Telephone Encounter (Signed)
Left message on machine Script

## 2013-09-03 NOTE — Telephone Encounter (Signed)
Pt request rx for oxyCODONE-acetaminophen (PERCOCET) 7.5-325 MG per tablet

## 2013-09-03 NOTE — Telephone Encounter (Signed)
Left message on machine Script can be pick up tomorrow after 12--also told him about [facility] for his percocet

## 2013-09-09 ENCOUNTER — Encounter: Payer: Self-pay | Admitting: Pulmonary Disease

## 2013-09-30 ENCOUNTER — Ambulatory Visit: Payer: Medicare Other | Admitting: Adult Health

## 2013-10-31 ENCOUNTER — Other Ambulatory Visit: Payer: Self-pay | Admitting: *Deleted

## 2013-10-31 ENCOUNTER — Telehealth: Payer: Self-pay | Admitting: Internal Medicine

## 2013-10-31 MED ORDER — OXYCODONE-ACETAMINOPHEN 7.5-325 MG PO TABS: 1.0000 | ORAL_TABLET | ORAL | Status: DC | PRN

## 2013-10-31 NOTE — Telephone Encounter (Signed)
Printed and will call pt to pick up tomorrow after dr Arnoldo Morale signs

## 2013-10-31 NOTE — Telephone Encounter (Signed)
Pt needs re-fill on oxyCODONE-acetaminophen (PERCOCET) 7.5-325 MG per tablet

## 2013-11-01 ENCOUNTER — Encounter: Payer: Self-pay | Admitting: Internal Medicine

## 2013-11-05 ENCOUNTER — Ambulatory Visit: Payer: Medicare Other | Admitting: Adult Health

## 2013-11-08 ENCOUNTER — Encounter: Payer: Self-pay | Admitting: *Deleted

## 2013-11-11 ENCOUNTER — Ambulatory Visit: Payer: Medicare Other | Admitting: Internal Medicine

## 2013-11-11 DIAGNOSIS — Z0289 Encounter for other administrative examinations: Secondary | ICD-10-CM

## 2013-11-12 ENCOUNTER — Other Ambulatory Visit: Payer: Self-pay | Admitting: *Deleted

## 2013-11-12 ENCOUNTER — Telehealth: Payer: Self-pay | Admitting: Internal Medicine

## 2013-11-12 DIAGNOSIS — Z0100 Encounter for examination of eyes and vision without abnormal findings: Principal | ICD-10-CM

## 2013-11-12 DIAGNOSIS — E119 Type 2 diabetes mellitus without complications: Secondary | ICD-10-CM

## 2013-11-12 NOTE — Telephone Encounter (Signed)
Referral sent 

## 2013-11-12 NOTE — Telephone Encounter (Signed)
Pt needs a referral to see Dr. Marilynne Halsted located at [address] [phone] and [phone]

## 2013-11-18 ENCOUNTER — Telehealth: Payer: Self-pay | Admitting: Internal Medicine

## 2013-11-18 NOTE — Telephone Encounter (Signed)
Left message on machine Too early received #120 on 10/31/2013- 18 days ago

## 2013-11-18 NOTE — Telephone Encounter (Signed)
Pt needs new rx oxycodone °

## 2013-11-22 ENCOUNTER — Telehealth: Payer: Self-pay | Admitting: Internal Medicine

## 2013-11-22 ENCOUNTER — Other Ambulatory Visit: Payer: Self-pay | Admitting: *Deleted

## 2013-11-22 DIAGNOSIS — K219 Gastro-esophageal reflux disease without esophagitis: Secondary | ICD-10-CM

## 2013-11-22 DIAGNOSIS — N400 Enlarged prostate without lower urinary tract symptoms: Secondary | ICD-10-CM

## 2013-11-22 MED ORDER — GABAPENTIN 300 MG PO CAPS: 300.0000 mg | ORAL_CAPSULE | Freq: Two times a day (BID) | ORAL | Status: DC

## 2013-11-22 MED ORDER — NAPROXEN 500 MG PO TABS
500.0000 mg | ORAL_TABLET | Freq: Two times a day (BID) | ORAL | Status: DC
Start: 2013-11-22 — End: 2014-11-12

## 2013-11-22 MED ORDER — OMEPRAZOLE 20 MG PO CPDR: DELAYED_RELEASE_CAPSULE | ORAL | Status: DC

## 2013-11-22 MED ORDER — TAMSULOSIN HCL 0.4 MG PO CAPS: 0.4000 mg | ORAL_CAPSULE | Freq: Every day | ORAL | Status: DC

## 2013-11-22 MED ORDER — FAMOTIDINE 20 MG PO TABS: 20.0000 mg | ORAL_TABLET | Freq: Every evening | ORAL | Status: DC | PRN

## 2013-11-22 MED ORDER — FINASTERIDE 5 MG PO TABS: 5.0000 mg | ORAL_TABLET | Freq: Every day | ORAL | Status: DC

## 2013-11-22 NOTE — Telephone Encounter (Signed)
done

## 2013-11-22 NOTE — Telephone Encounter (Signed)
[facility] requesting new scripts for the following:  famotidine (PEPCID) 20 MG tablet finasteride (PROSCAR) 5 MG tablet gabapentin (NEURONTIN) 300 MG capsule omeprazole (PRILOSEC) 20 MG capsule Tamsulosin HCl (FLOMAX) 0.4 MG CAPS NAPROXEN (not on med list)

## 2013-12-04 ENCOUNTER — Other Ambulatory Visit: Payer: Self-pay | Admitting: *Deleted

## 2013-12-04 ENCOUNTER — Telehealth: Payer: Self-pay | Admitting: Internal Medicine

## 2013-12-04 DIAGNOSIS — G4700 Insomnia, unspecified: Secondary | ICD-10-CM

## 2013-12-04 DIAGNOSIS — I10 Essential (primary) hypertension: Secondary | ICD-10-CM

## 2013-12-04 MED ORDER — AMITRIPTYLINE HCL 50 MG PO TABS: 50.0000 mg | ORAL_TABLET | Freq: Every day | ORAL | Status: DC

## 2013-12-04 MED ORDER — AMLODIPINE BESYLATE 5 MG PO TABS: 5.0000 mg | ORAL_TABLET | Freq: Every day | ORAL | Status: DC

## 2013-12-04 MED ORDER — ACYCLOVIR 400 MG PO TABS: 400.0000 mg | ORAL_TABLET | Freq: Two times a day (BID) | ORAL | Status: DC

## 2013-12-04 NOTE — Telephone Encounter (Signed)
[facility] requesting scripts for the following:  acyclovir (ZOVIRAX) 400 MG tablet amitriptyline (ELAVIL) 50 MG tablet amLODipine (NORVASC) 5 MG tablet

## 2013-12-04 NOTE — Telephone Encounter (Signed)
done

## 2013-12-06 ENCOUNTER — Telehealth: Payer: Self-pay | Admitting: Internal Medicine

## 2013-12-06 NOTE — Telephone Encounter (Signed)
Relevant patient education mailed to patient.  

## 2013-12-09 ENCOUNTER — Telehealth: Payer: Self-pay | Admitting: Internal Medicine

## 2013-12-09 NOTE — Telephone Encounter (Signed)
Pt requesting refill of oxyCODONE-acetaminophen (PERCOCET) 7.5-325 MG per tablet

## 2013-12-10 ENCOUNTER — Other Ambulatory Visit: Payer: Self-pay | Admitting: *Deleted

## 2013-12-10 MED ORDER — OXYCODONE-ACETAMINOPHEN 7.5-325 MG PO TABS: 1.0000 | ORAL_TABLET | ORAL | Status: DC | PRN

## 2013-12-10 NOTE — Telephone Encounter (Signed)
done

## 2013-12-11 ENCOUNTER — Telehealth: Payer: Self-pay | Admitting: Internal Medicine

## 2013-12-11 MED ORDER — GLYBURIDE-METFORMIN 2.5-500 MG PO TABS: 1.0000 | ORAL_TABLET | Freq: Every day | ORAL | Status: DC

## 2013-12-11 NOTE — Telephone Encounter (Signed)
[facility] [address] requesting of glyBURIDE-metformin (GLUCOVANCE) 2.5-500 MG per tablet

## 2013-12-11 NOTE — Telephone Encounter (Signed)
rx sent in electronically 

## 2013-12-27 ENCOUNTER — Other Ambulatory Visit: Payer: Self-pay | Admitting: *Deleted

## 2013-12-27 DIAGNOSIS — G4700 Insomnia, unspecified: Secondary | ICD-10-CM

## 2013-12-27 MED ORDER — AMITRIPTYLINE HCL 50 MG PO TABS: 50.0000 mg | ORAL_TABLET | Freq: Every day | ORAL | Status: DC

## 2014-01-16 ENCOUNTER — Telehealth: Payer: Self-pay | Admitting: Internal Medicine

## 2014-01-16 MED ORDER — OXYCODONE-ACETAMINOPHEN 7.5-325 MG PO TABS: 1.0000 | ORAL_TABLET | ORAL | Status: DC | PRN

## 2014-01-16 NOTE — Telephone Encounter (Signed)
Pt request refill oxyCODONE-acetaminophen (PERCOCET) 7.5-325 MG per tablet

## 2014-01-20 ENCOUNTER — Ambulatory Visit (INDEPENDENT_AMBULATORY_CARE_PROVIDER_SITE_OTHER): Payer: Medicare HMO | Admitting: Internal Medicine

## 2014-01-20 ENCOUNTER — Encounter: Payer: Self-pay | Admitting: Internal Medicine

## 2014-01-20 VITALS — BP 130/70 | HR 110 | Temp 97.9°F | Ht 70.5 in | Wt 212.0 lb

## 2014-01-20 DIAGNOSIS — G894 Chronic pain syndrome: Secondary | ICD-10-CM

## 2014-01-20 DIAGNOSIS — I10 Essential (primary) hypertension: Secondary | ICD-10-CM

## 2014-01-20 MED ORDER — OXYCODONE-ACETAMINOPHEN 7.5-325 MG PO TABS: 1.0000 | ORAL_TABLET | ORAL | Status: DC | PRN

## 2014-01-20 MED ORDER — ACYCLOVIR 5 % EX OINT: 1.0000 "application " | TOPICAL_OINTMENT | CUTANEOUS | Status: DC | PRN

## 2014-01-20 MED ORDER — CYANOCOBALAMIN 1000 MCG/ML IJ SOLN
1000.0000 ug | Freq: Once | INTRAMUSCULAR | Status: DC
Start: 2014-01-20 — End: 2015-12-21

## 2014-01-20 MED ORDER — CYANOCOBALAMIN 1000 MCG/ML IJ SOLN
1000.0000 ug | Freq: Once | INTRAMUSCULAR | Status: AC
  Administered 2014-01-20: 1000 ug via INTRAMUSCULAR

## 2014-01-20 NOTE — Patient Instructions (Signed)
The patient is instructed to continue all medications as prescribed. Schedule followup with check out clerk upon leaving the clinic  

## 2014-01-20 NOTE — Addendum Note (Signed)
Addended by: Harl Bowie on: 01/20/2014 03:41 PM   Modules accepted: Orders

## 2014-01-20 NOTE — Progress Notes (Signed)
Pre visit review using our clinic review tool, if applicable. No additional management support is needed unless otherwise documented below in the visit note. 

## 2014-01-20 NOTE — Progress Notes (Signed)
Subjective:    Patient ID: [name], male    DOB: 1941-04-04, 73 y.o.   MRN: [id number]  Diabetes Pertinent negatives for diabetes include no fatigue and no weakness.   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  HENT: Negative for congestion, hearing loss and postnasal drip.   Eyes: Negative for discharge, redness and visual disturbance.  Respiratory: Negative for cough, shortness of breath and wheezing.   Cardiovascular: Negative for leg swelling.  Gastrointestinal: Negative for abdominal pain, constipation and abdominal distention.  Genitourinary: Negative for urgency and frequency.  Musculoskeletal: Positive for arthralgias, back pain, gait problem and joint swelling. Negative for neck pain.  Skin: Negative for color change and rash.  Neurological: Negative for weakness and light-headedness.  Hematological: Negative for adenopathy.  Psychiatric/Behavioral: Negative for behavioral problems.   Past Medical History  Diagnosis Date  . Hyperlipidemia   . Hypertension August 12, 2009    try off ACE for upper airway cough  . Coughing December 18, 2009    Sinus CT : Minor mucosal thickening at the Right Frontoethmoidal recess, otherwise  negative sinuses  . COPD (chronic obstructive pulmonary disease) December 15, 2009    FEV1 2.30 (70%) ratio 63 no better with B2 and DLCO 18/6 (73%) corrects to 106%  . Diabetes mellitus type II   . Degenerative joint disease   . Depression   . Benign prostatic hypertrophy   . Low back pain     History   Social History  . Marital Status: Married    Spouse Name: N/A    Number of Children: N/A  . Years of Education: N/A   Occupational History  . 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 Smoking status: Former Smoker -- 4.00 packs/day for 20 years    Types: Cigarettes   Quit date: 09/18/1984  . Smokeless tobacco: Never Used     Comment: quit in 1980  . Alcohol Use: No  . Drug Use: No  . Sexual Activity: Yes   Other Topics Concern  . Not on file   Social History Narrative  . No narrative on file    Past Surgical History  Procedure Laterality Date  . Dvt  1980    Family History  Problem Relation Age of Onset  . Cancer Mother     melanoma  . Stroke Father     No Known Allergies  Current Outpatient Prescriptions on File Prior to Visit  Medication Sig Dispense Refill  . acyclovir (ZOVIRAX) 400 MG tablet Take 1 tablet (400 mg total) by mouth 2 (two) times daily.  180 tablet  3  . amitriptyline (ELAVIL) 50 MG tablet Take 1 tablet (50 mg total) by mouth at bedtime.  90 tablet  3  . amLODipine (NORVASC) 5 MG tablet Take 1 tablet (5 mg total) by mouth daily.  90 tablet  3  . aspirin 81 MG tablet Take 81 mg by mouth daily.        . Cholecalciferol 1000 UNITS capsule Take 1 capsule (1,000 Units total) by mouth 2 (two) times daily.      . famotidine (PEPCID) 20 MG tablet Take 1 tablet (20 mg total) by mouth at bedtime as needed for heartburn. One at bedtime  90 tablet  3  . finasteride (PROSCAR) 5 MG tablet Take 1 tablet (  5 mg total) by mouth daily.  90 tablet  3  . FLUoxetine (PROZAC) 20 MG tablet Take 1 tablet (20 mg total) by mouth every morning.  90 tablet  3  . gabapentin (NEURONTIN) 300 MG capsule Take 1 capsule (300 mg total) by mouth 2 (two) times daily.  180 capsule  3  . glyBURIDE-metformin (GLUCOVANCE) 2.5-500 MG per tablet Take 1 tablet by mouth daily with breakfast.  90 tablet  3  . Multiple Vitamin (MULTIVITAMIN) capsule Take 1 capsule by mouth daily.        . naproxen (NAPROSYN) 500 MG tablet Take 1 tablet (500 mg total) by mouth 2 (two) times daily with a meal.  180 tablet  3  . omeprazole (PRILOSEC) 20 MG capsule Take one 30-60 min before first meal of the day  90 capsule  3  . tamsulosin (FLOMAX) 0.4 MG CAPS capsule Take 1 capsule  (0.4 mg total) by mouth daily.  90 capsule  3   No current facility-administered medications on file prior to visit.    BP 130/70  Pulse 110  Temp(Src) 97.9 F (36.6 C) (Oral)  Ht 5' 10.5" (1.791 m)  Wt 212 lb (96.163 kg)  BMI 29.98 kg/m2  SpO2 95%       Objective:   Physical Exam  Constitutional: He appears well-developed and well-nourished.  HENT:  Head: Normocephalic and atraumatic.  Eyes: Conjunctivae are normal. Pupils are equal, round, and reactive to light.  Neck: Normal range of motion. Neck supple.  Cardiovascular: Normal rate and regular rhythm.   Murmur heard. Pulmonary/Chest: Effort normal and breath sounds normal.  Abdominal: Soft. Bowel sounds are normal.  Musculoskeletal: He exhibits edema and tenderness.          Assessment & Plan:  Topical cream for HSV cold sores B12 for fatigue Stable HTN Stable pain control  Follow up for pain medications in 90 days with MATT

## 2014-01-21 ENCOUNTER — Telehealth: Payer: Self-pay | Admitting: Internal Medicine

## 2014-01-21 NOTE — Telephone Encounter (Signed)
[employer] denied acyclovir ointment (ZOVIRAX) 5 %.  It states pt must try and fail TWO of the following:  Oral acyclovir, valacyclovir, or famciclovir.  I noted the pt has tried Oral acyclovir.

## 2014-01-22 LAB — PULMONARY FUNCTION TEST
DL/VA % pred: 89 %
DL/VA: 4.12 ml/min/mmHg/L
DLCO unc % pred: 65 %
DLCO unc: 21.12 ml/min/mmHg
FEF 25-75 Post: 1.47 L/sec
FEF 25-75 Pre: 1.42 L/sec
FEF2575-%Change-Post: 2 %
FEF2575-%Pred-Post: 62 %
FEF2575-%Pred-Pre: 60 %
FEV1-%Change-Post: 1 %
FEV1-%Pred-Post: 70 %
FEV1-%Pred-Pre: 69 %
FEV1-Post: 2.22 L
FEV1-Pre: 2.18 L
FEV1FVC-%Change-Post: 4 %
FEV1FVC-%Pred-Pre: 97 %
FEV6-%Change-Post: -2 %
FEV6-%Pred-Post: 72 %
FEV6-%Pred-Pre: 74 %
FEV6-Post: 2.97 L
FEV6-Pre: 3.05 L
FEV6FVC-%Change-Post: 0 %
FEV6FVC-%Pred-Post: 105 %
FEV6FVC-%Pred-Pre: 105 %
FVC-%Change-Post: -2 %
FVC-%Pred-Post: 69 %
FVC-%Pred-Pre: 70 %
FVC-Post: 2.99 L
Post FEV1/FVC ratio: 74 %
Post FEV6/FVC ratio: 99 %
Pre FEV1/FVC ratio: 71 %
Pre FEV6/FVC Ratio: 100 %
RV % pred: 97 %
RV: 2.45 L
TLC % pred: 73 %
TLC: 5.2 L

## 2014-01-24 MED ORDER — VALACYCLOVIR HCL 500 MG PO TABS: 500.0000 mg | ORAL_TABLET | Freq: Three times a day (TID) | ORAL | Status: DC

## 2014-01-24 NOTE — Telephone Encounter (Signed)
New rx for valtrex 500 mg tid sent to pt pharmacy, oral acyclovir discontinued

## 2014-01-31 ENCOUNTER — Telehealth: Payer: Self-pay | Admitting: Internal Medicine

## 2014-01-31 MED ORDER — VALACYCLOVIR HCL 500 MG PO TABS: 500.0000 mg | ORAL_TABLET | Freq: Two times a day (BID) | ORAL | Status: DC

## 2014-01-31 NOTE — Telephone Encounter (Signed)
[employer] is denying the quantity of 90 per 30 days on Valacyclovir.  The maximum quantity is 60/30 days.

## 2014-02-11 ENCOUNTER — Other Ambulatory Visit: Payer: Self-pay | Admitting: Internal Medicine

## 2014-02-12 ENCOUNTER — Ambulatory Visit: Payer: Medicare HMO | Admitting: Internal Medicine

## 2014-03-24 ENCOUNTER — Encounter: Payer: Self-pay | Admitting: Pulmonary Disease

## 2014-03-24 ENCOUNTER — Ambulatory Visit (INDEPENDENT_AMBULATORY_CARE_PROVIDER_SITE_OTHER)
Admission: RE | Admit: 2014-03-24 | Discharge: 2014-03-24 | Disposition: A | Discharge status: Home / Self Care | Payer: Commercial Managed Care - HMO | Source: Ambulatory Visit | Attending: Pulmonary Disease | Admitting: Pulmonary Disease

## 2014-03-24 ENCOUNTER — Ambulatory Visit: Payer: Commercial Managed Care - HMO | Admitting: Pulmonary Disease

## 2014-03-24 ENCOUNTER — Ambulatory Visit (INDEPENDENT_AMBULATORY_CARE_PROVIDER_SITE_OTHER): Payer: Commercial Managed Care - HMO | Admitting: Pulmonary Disease

## 2014-03-24 VITALS — BP 162/78 | HR 94 | Temp 98.0°F | Ht 72.0 in | Wt 214.2 lb

## 2014-03-24 DIAGNOSIS — J849 Interstitial pulmonary disease, unspecified: Secondary | ICD-10-CM

## 2014-03-24 DIAGNOSIS — J8410 Pulmonary fibrosis, unspecified: Secondary | ICD-10-CM

## 2014-03-24 NOTE — Assessment & Plan Note (Signed)
Given vague nodular densities on chest x-ray, will proceed with high-resolution CT scan of chest. We'll obtain PFTs to quantitate lung function and compared with prior studies. His symptoms appear stable, so I doubt that he has progression of interstitial lung disease.

## 2014-03-24 NOTE — Progress Notes (Signed)
   Subjective:    Patient ID: [name], male    DOB: [birth year], 73 y.o.   MRN: [id number]  HPI  2 yowm, remote smoker for FU of interstitial lung disease with some response to steroids  He presented in sep 2013 with abnormal imaging & persistent cough & dyspnea x 3 years  He quit smoking around 1986. He has a significant environmental exposure history to Asbestos and has also worked in a Equities trader x 20 y. He has workshop at home ,does [employer] vintage cars. They have a parakeet at home x 20 y - wife cleans the cage.  Initial OV (MW) August 12, 2009 For cough     Adm 04/06/2011 - 04/12/2011 , Dx ? boop disharge on 02 1lpm , no biopsy done  Ct chest 03/2011 Showed Diffuse bilateral alveolar and interstitial infiltrates of nonspecific etiology   PFT's March 2011 FEV1 2.30 (70%) ratio 63 no better with B2 and DLC0 18.6 (73%) corrects to 106%  RPt 06/2012 showed FEv1 54% (1.71) with ratio 61 (drop), no BD response, TLC 63%, DLCO 70% (unchanged)  HRCT showed diffuse fine peribronchovascular nodular pattern >> responded to prednisone x 2 weeks  Spiriva was stopped, he stopped taking tudorza- ineffective   PFTs 07/2013 stable to slight improved from last yr   03/24/2014  1 yr FU   Chief Complaint  Patient presents with  . Follow-up    Pt reports the hot weather makes breathing worse. he can barely do any activity in this weather. No wheezing,no chest tx, no cough   He remains off bronchodilators. Breathing may be slight worsened the summer, but he feels overall okay. No cough or skin rash He  Desaturates from 98-95% on walking with heart rate increasing from 90-125 . Chest x-ray shows vague nodular densities in right upper lobe  Past Medical History  Diagnosis Date  . Hyperlipidemia   . Hypertension August 12, 2009    try off ACE for upper airway cough  . Coughing December 18, 2009    Sinus CT : Minor mucosal thickening at the Right Frontoethmoidal recess,  otherwise  negative sinuses  . COPD (chronic obstructive pulmonary disease) December 15, 2009    FEV1 2.30 (70%) ratio 63 no better with B2 and DLCO 18/6 (73%) corrects to 106%  . Diabetes mellitus type II   . Degenerative joint disease   . Depression   . Benign prostatic hypertrophy   . Low back pain      Review of Systems neg for any significant sore throat, dysphagia, itching, sneezing, nasal congestion or excess/ purulent secretions, fever, chills, sweats, unintended wt loss, pleuritic or exertional cp, hempoptysis, orthopnea pnd or change in chronic leg swelling. Also denies presyncope, palpitations, heartburn, abdominal pain, nausea, vomiting, diarrhea or change in bowel or urinary habits, dysuria,hematuria, rash, arthralgias, visual complaints, headache, numbness weakness or ataxia.     Objective:   Physical Exam  Gen. Pleasant, well-nourished, in no distress ENT - no lesions, no post nasal drip Neck: No JVD, no thyromegaly, no carotid bruits Lungs: no use of accessory muscles, no dullness to percussion, clear without rales or rhonchi  Cardiovascular: Rhythm regular, heart sounds  normal, no murmurs or gallops, no peripheral edema Musculoskeletal: No deformities, no cyanosis or clubbing        Assessment & Plan:

## 2014-03-24 NOTE — Progress Notes (Signed)
PFT done today. 

## 2014-03-24 NOTE — Patient Instructions (Signed)
Reassess degree of scarring with CXR & breathing test

## 2014-03-26 ENCOUNTER — Telehealth: Payer: Self-pay | Admitting: Pulmonary Disease

## 2014-03-26 DIAGNOSIS — R911 Solitary pulmonary nodule: Secondary | ICD-10-CM

## 2014-03-26 NOTE — Telephone Encounter (Signed)
Notes Recorded by Rigoberto Noel, MD on 03/24/2014 at 12:34 PM Nodule noted in the right upper lung Recommend high-resolution CT scan of chest-no contrast --  lmtcb x1

## 2014-03-27 NOTE — Telephone Encounter (Signed)
Notes Recorded by Rigoberto Noel, MD on 03/26/2014 at 5:26 PM Lung function stable ---  lmtcb x2 for pt

## 2014-03-27 NOTE — Telephone Encounter (Signed)
Pt returned call. Informed pt of CXR results and RA recs to do a HRCT. Informed pt that someone will be contacting him to schedule the CT. Pt verbalized understanding and denied any further questions or concerns at this time. Order has been placed.

## 2014-03-31 ENCOUNTER — Ambulatory Visit (INDEPENDENT_AMBULATORY_CARE_PROVIDER_SITE_OTHER)
Admission: RE | Admit: 2014-03-31 | Discharge: 2014-03-31 | Disposition: A | Discharge status: Home / Self Care | Payer: Commercial Managed Care - HMO | Source: Ambulatory Visit | Attending: Pulmonary Disease | Admitting: Pulmonary Disease

## 2014-03-31 DIAGNOSIS — R911 Solitary pulmonary nodule: Secondary | ICD-10-CM

## 2014-04-02 ENCOUNTER — Telehealth: Payer: Self-pay | Admitting: Pulmonary Disease

## 2014-04-02 DIAGNOSIS — R911 Solitary pulmonary nodule: Secondary | ICD-10-CM

## 2014-04-02 NOTE — Telephone Encounter (Signed)
Notes Recorded by Inge Rise, CMA on 04/01/2014 at 10:58 AM lmomtcb x1 ------  Notes Recorded by Rigoberto Noel, MD on 03/31/2014 at 11:50 AM No scarring Changes of COPD as prior Nodule in RUL needs FU scan in 12 months  Order placed, pt is aware of results. [place], CMA

## 2014-04-29 ENCOUNTER — Telehealth: Payer: Self-pay | Admitting: Internal Medicine

## 2014-04-29 NOTE — Telephone Encounter (Signed)
Pt need re-fills on oxyCODONE-acetaminophen (PERCOCET) 7.5-325 MG per tablet

## 2014-05-05 MED ORDER — OXYCODONE-ACETAMINOPHEN 7.5-325 MG PO TABS: 1.0000 | ORAL_TABLET | ORAL | Status: DC | PRN

## 2014-05-05 NOTE — Telephone Encounter (Signed)
Ok per Dr Arnoldo Morale, rx will be ready this afternoon, pt aware

## 2014-05-06 LAB — PULMONARY FUNCTION TEST
DL/VA % pred: 100 %
DL/VA: 4.63 ml/min/mmHg/L
DLCO UNC % PRED: 68 %
DLCO UNC: 22.05 ml/min/mmHg
FEF 25-75 Post: 1.4 L/sec
FEF 25-75 Pre: 1.41 L/sec
FEF2575-%Change-Post: 0 %
FEF2575-%Pred-Post: 60 %
FEF2575-%Pred-Pre: 60 %
FEV1-%Change-Post: 0 %
FEV1-%Pred-Post: 74 %
FEV1-%Pred-Pre: 73 %
FEV1-Post: 2.33 L
FEV1-Pre: 2.31 L
FEV1FVC-%Change-Post: 3 %
FEV1FVC-%Pred-Pre: 95 %
FEV6-%CHANGE-POST: -2 %
FEV6-%PRED-PRE: 80 %
FEV6-%Pred-Post: 79 %
FEV6-POST: 3.21 L
FEV6-Pre: 3.28 L
FEV6FVC-%CHANGE-POST: 0 %
FEV6FVC-%PRED-POST: 106 %
FEV6FVC-%Pred-Pre: 105 %
FVC-%CHANGE-POST: -2 %
FVC-%PRED-POST: 74 %
FVC-%PRED-PRE: 76 %
FVC-POST: 3.21 L
FVC-Pre: 3.3 L
PRE FEV1/FVC RATIO: 70 %
Post FEV1/FVC ratio: 73 %
Post FEV6/FVC ratio: 100 %
Pre FEV6/FVC Ratio: 99 %
RV % PRED: 79 %
RV: 2.01 L
TLC % pred: 73 %
TLC: 5.18 L

## 2014-05-09 ENCOUNTER — Telehealth: Payer: Self-pay | Admitting: *Deleted

## 2014-05-09 NOTE — Telephone Encounter (Signed)
Left message on machine for patient to call back and schedule an appointment Diabetic bundle

## 2014-05-21 NOTE — Telephone Encounter (Signed)
Left message on machine for patient to schedule an appointment

## 2014-06-04 ENCOUNTER — Other Ambulatory Visit: Payer: Self-pay | Admitting: Internal Medicine

## 2014-06-04 DIAGNOSIS — Z947 Corneal transplant status: Secondary | ICD-10-CM

## 2014-06-06 ENCOUNTER — Telehealth: Payer: Self-pay | Admitting: Internal Medicine

## 2014-06-06 NOTE — Telephone Encounter (Signed)
Called and Lm on pt VM TCB

## 2014-06-06 NOTE — Telephone Encounter (Signed)
I do not give narcotic refills without an office visit. Also, I typically do not prescribe chronic narcotics for low back pain.   Please offer patient an appointment within the next week to discuss. We may ultimately refer him to pain management to look into maximizing non narcotic pain management but give him a supply of medication to get him to the pain management appointment.

## 2014-06-06 NOTE — Telephone Encounter (Signed)
Pt request refill oxyCODONE-acetaminophen (PERCOCET) 7.5-325 MG per tablet Pt has appt on oct 28 and is hoping/planning on getting this refilled every month., especially until his appt. pls advise

## 2014-06-06 NOTE — Telephone Encounter (Signed)
Dr. Yong Channel please see below message

## 2014-06-09 NOTE — Telephone Encounter (Signed)
Called and Lm with pt wife to have pt give me a call

## 2014-06-11 NOTE — Telephone Encounter (Signed)
appt has been scheduled.

## 2014-06-12 ENCOUNTER — Encounter: Payer: Self-pay | Admitting: Family Medicine

## 2014-06-12 ENCOUNTER — Ambulatory Visit (INDEPENDENT_AMBULATORY_CARE_PROVIDER_SITE_OTHER): Payer: Commercial Managed Care - HMO | Admitting: Family Medicine

## 2014-06-12 VITALS — BP 138/78 | HR 101 | Temp 97.9°F | Ht 72.0 in | Wt 207.0 lb

## 2014-06-12 DIAGNOSIS — E785 Hyperlipidemia, unspecified: Secondary | ICD-10-CM

## 2014-06-12 DIAGNOSIS — I10 Essential (primary) hypertension: Secondary | ICD-10-CM

## 2014-06-12 DIAGNOSIS — M549 Dorsalgia, unspecified: Secondary | ICD-10-CM

## 2014-06-12 DIAGNOSIS — E119 Type 2 diabetes mellitus without complications: Secondary | ICD-10-CM

## 2014-06-12 DIAGNOSIS — M1990 Unspecified osteoarthritis, unspecified site: Secondary | ICD-10-CM

## 2014-06-12 LAB — COMPREHENSIVE METABOLIC PANEL
ALK PHOS: 123 U/L — AB (ref 39–117)
ALT: 16 U/L (ref 0–53)
AST: 26 U/L (ref 0–37)
Albumin: 3.6 g/dL (ref 3.5–5.2)
BILIRUBIN TOTAL: 0.7 mg/dL (ref 0.2–1.2)
BUN: 16 mg/dL (ref 6–23)
CALCIUM: 8.9 mg/dL (ref 8.4–10.5)
CHLORIDE: 106 meq/L (ref 96–112)
CO2: 29 mEq/L (ref 19–32)
CREATININE: 0.9 mg/dL (ref 0.4–1.5)
GFR: 83.53 mL/min (ref >60.00)
Glucose, Bld: 170 mg/dL — ABNORMAL HIGH (ref 70–99)
Potassium: 5.3 mEq/L — ABNORMAL HIGH (ref 3.5–5.1)
Sodium: 141 mEq/L (ref 135–145)
Total Protein: 6.7 g/dL (ref 6.0–8.3)

## 2014-06-12 LAB — CBC
HCT: 36.8 % — ABNORMAL LOW (ref 39.0–52.0)
HEMOGLOBIN: 12.3 g/dL — AB (ref 13.0–17.0)
MCHC: 33.4 g/dL (ref 30.0–36.0)
MCV: 96.2 fl (ref 78.0–100.0)
Platelets: 120 10*3/uL — ABNORMAL LOW (ref 150.0–400.0)
RBC: 3.83 Mil/uL — ABNORMAL LOW (ref 4.22–5.81)
RDW: 14.2 % (ref 11.5–15.5)
WBC: 5.1 10*3/uL (ref 4.0–10.5)

## 2014-06-12 LAB — LIPID PANEL
CHOLESTEROL: 139 mg/dL (ref 0–200)
HDL: 26.1 mg/dL — ABNORMAL LOW (ref >39.00)
LDL Cholesterol: 73 mg/dL (ref 0–99)
NonHDL: 112.9
TRIGLYCERIDES: 198 mg/dL — AB (ref 0.0–149.0)
Total CHOL/HDL Ratio: 5
VLDL: 39.6 mg/dL (ref 0.0–40.0)

## 2014-06-12 LAB — HEMOGLOBIN A1C: Hgb A1c MFr Bld: 7.1 % — ABNORMAL HIGH (ref 4.6–6.5)

## 2014-06-12 LAB — TSH: TSH: 3.75 u[IU]/mL (ref 0.35–4.50)

## 2014-06-12 MED ORDER — OXYCODONE-ACETAMINOPHEN 7.5-325 MG PO TABS: 1.0000 | ORAL_TABLET | Freq: Four times a day (QID) | ORAL | Status: DC | PRN

## 2014-06-12 NOTE — Assessment & Plan Note (Signed)
Well controlled on manual repeat. Continue amlodipine

## 2014-06-12 NOTE — Assessment & Plan Note (Signed)
Discussed natural course of use of narcotics with potential escalating doses. I am concerned for addictive nature given alcohol abuse. Patient states he is aware of this risk but could stop at any time. He would like to take aleve as alternative and willing to try tramadol. Will start with labs today to see if can tolerate aleve based off of GFR. 1 month rx for percocet given With decrease by 10 # from last rx of 120.

## 2014-06-12 NOTE — Progress Notes (Signed)
[name], MD Phone: [phone]  Subjective:   [name] is a 73 y.o. 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 He has had dose escalations at least twice. 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 He states if he could take aleve he would prefer that as it was the best pain relief he ever had. 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 Despite this, has naproxen on his list and takes occasionally. His pain is throughout low back and at times radiates into right leg as far as the calf. He has had steroid injections in his epidural space before with only 2-3 month benefit.   Patient also has chronic knee pain bilaterally and states he was told by orthopedic surgeon that bilateral knee replacement was needed. He is not interested in surgery at his age. Steroid shots in his knees last only 2-3 months max and at times have not given him any pain relief and have just caused weight gain  ROS-No saddle anesthesia, bladder incontinence, fecal incontinence, weakness in extremity, numbness or tingling in extremity. History negative for trauma, history of cancer, fever, chills, unintentional weight loss, recent bacterial infection, recent IV drug use, HIV, pain worse at night or while supine.   Hypertension BP Readings from Last 3 Encounters:  06/12/14 138/78  03/24/14 162/78  01/20/14 130/70  Home BP monitoring-no Compliant with medications-yes without side effects ROS-Denies any CP, HA, blurry vision, LE edema.  ROS-  MRI 02/15/2008 lumbar spine 1".Advanced disc disease at L2-3 with extensive endplate change,  most likely discogenic in nature. In the appropriate clinical    setting, infection is not entirely excluded, but felt to be much  less likely.  2. Broad-based disc bulging, lateral recess and foraminal  narrowing at L4-5 with more chronic-appearing endplate changes.  3. Multilevel spondylosis is also evident at L3-4 and L5-S1.  4. Lipoma of the filum terminalis with normal termination level of  the conus medullaris"   Past Medical History- Patient Active Problem List   Diagnosis Date Noted  . ILD (interstitial lung disease) 07/05/2012    Priority: High  . COPD UNSPECIFIED 12/15/2009    Priority: High  . BACK PAIN, CHRONIC 05/02/2007    Priority: High  . DEGENERATIVE JOINT DISEASE 04/06/2007    Priority: Medium  . BENIGN PROSTATIC HYPERTROPHY 06/20/2007  . DEPRESSION 05/02/2007  . DM w/o Complication Type II [id number]  . HYPERLIPIDEMIA 04/06/2007  . HYPERTENSION 04/06/2007   Medications- reviewed and updated Current Outpatient Prescriptions  Medication Sig Dispense Refill  . amitriptyline (ELAVIL) 50 MG tablet Take 1 tablet (50 mg total) by mouth at bedtime.  90 tablet  3  . amLODipine (NORVASC) 5 MG tablet Take 1 tablet (5 mg total) by mouth daily.  90 tablet  3  . aspirin 81 MG tablet Take 81 mg by mouth daily.        . Cholecalciferol 1000 UNITS capsule Take 1 capsule (1,000 Units total) by mouth 2 (two) times daily.      . cyanocobalamin (,VITAMIN B-12,) 1000 MCG/ML injection Inject 1 mL (1,000 mcg total) into the muscle once.  1  mL  0  . famotidine (PEPCID) 20 MG tablet Take 1 tablet (20 mg total) by mouth at bedtime as needed for heartburn. One at bedtime  90 tablet  3  . finasteride (PROSCAR) 5 MG tablet Take 1 tablet (5 mg total) by mouth daily.  90 tablet  3  . FLUoxetine (PROZAC) 20 MG tablet Take 1 tablet (20 mg total) by mouth every morning.  90 tablet  3  . gabapentin (NEURONTIN) 300 MG capsule Take 1 capsule (300 mg total) by mouth 2 (two) times daily.  180 capsule  3  . glyBURIDE-metformin (GLUCOVANCE) 2.5-500 MG per tablet  Take 1 tablet by mouth daily with breakfast.  90 tablet  3  . Multiple Vitamin (MULTIVITAMIN) capsule Take 1 capsule by mouth daily.        . naproxen (NAPROSYN) 500 MG tablet Take 1 tablet (500 mg total) by mouth 2 (two) times daily with a meal.  180 tablet  3  . omeprazole (PRILOSEC) 20 MG capsule Take one 30-60 min before first meal of the day  90 capsule  3  . oxyCODONE-acetaminophen (PERCOCET) 7.5-325 MG per tablet Take 1 tablet by mouth every 6 (six) hours as needed for pain.  110 tablet  0  . tamsulosin (FLOMAX) 0.4 MG CAPS capsule Take 1 capsule (0.4 mg total) by mouth daily.  90 capsule  3  . TRUEPLUS LANCETS 28G MISC TEST TWO TIMES DAILY  200 each  3  . valACYclovir (VALTREX) 500 MG tablet Take 1 tablet (500 mg total) by mouth 2 (two) times daily.  60 tablet  11   No current facility-administered medications for this visit.    Objective: BP 138/78  Pulse 101  Temp(Src) 97.9 F (36.6 C)  Ht 6' (1.829 m)  Wt 207 lb (93.895 kg)  BMI 28.07 kg/m2 Gen: NAD, resting comfortably in chair, sits forward to allow comfort CV: RRR no murmurs rubs or gallops Lungs: CTAB no crackles, wheeze, rhonchi Knees: ligaments stable Back - Normal skin, Spine with normal alignment and no deformity.  No tenderness to vertebral process palpation.  Paraspinous muscles are tender but without spasm.   Range of motion is full at neck. Forward flexion -cannot touch ground by 6-9 inches. Full extension.   Neuro- no saddle anesthesia, 5/5 strength lower extremities, 1+ reflexes  Assessment/Plan:  BACK PAIN, CHRONIC Discussed natural course of use of narcotics with potential escalating doses. I am concerned for addictive nature given alcohol abuse. Patient states he is aware of this risk but could stop at any time. He would like to take aleve as alternative and willing to try tramadol. Will start with labs today to see if can tolerate aleve based off of GFR. 1 month rx for percocet given With decrease by 10 #  from last rx of 120.   DEGENERATIVE JOINT DISEASE Discussed possible pain management referral as well if unable to wean back to tramadol. Patient agreeable to exploring option for other modalities to help pain. Another consideration would be sports med/ortho for consideration of synvisc or similar option.   HYPERTENSION Well controlled on manual repeat. Continue amlodipine   Orders Placed This Encounter  Procedures  . CBC    [place]  . Comprehensive metabolic panel    [place]  . Hemoglobin A1c    [place]  . TSH    [place]  . Lipid panel    [place]    Meds ordered this encounter  Medications  . oxyCODONE-acetaminophen (PERCOCET) 7.5-325 MG per tablet  Sig: Take 1 tablet by mouth every 6 (six) hours as needed for pain.    Dispense:  110 tablet    Refill:  0

## 2014-06-12 NOTE — Assessment & Plan Note (Signed)
Discussed possible pain management referral as well if unable to wean back to tramadol. Patient agreeable to exploring option for other modalities to help pain. Another consideration would be sports med/ortho for consideration of synvisc or similar option.

## 2014-06-12 NOTE — Progress Notes (Signed)
Pre visit review using our clinic review tool, if applicable. No additional management support is needed unless otherwise documented below in the visit note. 

## 2014-06-12 NOTE — Patient Instructions (Signed)
Let's check your labs today. There is a possibility we can have you take aleve if your kidney function is better. I gave you a refill on your pain medicine. We will see if we can reduce the amount/strength of medicine. If we are unable to get you down to tramadol then I will likely refer you to pain management.   See me back next week so we can talk about your diabetes and urination issues (infrequent)  Health Maintenance Due  Topic Date Due  . Foot Exam  01/28/1951  . Zostavax  01/27/2001  . Ophthalmology Exam  09/25/2008  . Urine Microalbumin  08/22/2012  . Hemoglobin A1c  12/29/2013  . Influenza Vaccine  05/10/2014

## 2014-06-20 ENCOUNTER — Encounter: Payer: Self-pay | Admitting: Family Medicine

## 2014-06-20 ENCOUNTER — Other Ambulatory Visit: Payer: Self-pay

## 2014-06-20 ENCOUNTER — Ambulatory Visit (INDEPENDENT_AMBULATORY_CARE_PROVIDER_SITE_OTHER): Payer: Commercial Managed Care - HMO | Admitting: Family Medicine

## 2014-06-20 VITALS — BP 130/70 | HR 80 | Temp 98.1°F | Wt 208.0 lb

## 2014-06-20 DIAGNOSIS — E119 Type 2 diabetes mellitus without complications: Secondary | ICD-10-CM

## 2014-06-20 DIAGNOSIS — Z23 Encounter for immunization: Secondary | ICD-10-CM

## 2014-06-20 DIAGNOSIS — F1110 Opioid abuse, uncomplicated: Secondary | ICD-10-CM

## 2014-06-20 DIAGNOSIS — F1190 Opioid use, unspecified, uncomplicated: Secondary | ICD-10-CM

## 2014-06-20 DIAGNOSIS — M549 Dorsalgia, unspecified: Secondary | ICD-10-CM

## 2014-06-20 DIAGNOSIS — B3749 Other urogenital candidiasis: Secondary | ICD-10-CM

## 2014-06-20 LAB — MICROALBUMIN / CREATININE URINE RATIO
Creatinine,U: 75.4 mg/dL
Microalb Creat Ratio: 4.2 mg/g (ref 0.0–30.0)
Microalb, Ur: 3.2 mg/dL — ABNORMAL HIGH (ref 0.0–1.9)

## 2014-06-20 MED ORDER — CLOTRIMAZOLE 1 % EX CREA: 1.0000 "application " | TOPICAL_CREAM | Freq: Two times a day (BID) | CUTANEOUS | Status: DC

## 2014-06-20 MED ORDER — CLOTRIMAZOLE-BETAMETHASONE 1-0.05 % EX CREA: 1.0000 "application " | TOPICAL_CREAM | Freq: Two times a day (BID) | CUTANEOUS | Status: DC

## 2014-06-20 NOTE — Assessment & Plan Note (Signed)
Recurrent issue. Currently poorly controlled. Treat with Lotrisone or clotrimazole as likely candidal

## 2014-06-20 NOTE — Assessment & Plan Note (Signed)
Reduced Percocet usage by about 50% with addition of Aleve. Will continue to use Percocet 7/325 but only 2 pills per day-patient can use a half pill if needed. Return in one month. Sign pain contract at that time. Urine drug screen today

## 2014-06-20 NOTE — Progress Notes (Signed)
[name], MD Phone: [phone]  Subjective:   [name] is a 73 y.o. year old very pleasant male patient who presents with the following:  DIABETES Type II-reasonable control Lab Results  Component Value Date   HGBA1C 7.1* 06/12/2014   HGBA1C 7.0* 07/01/2013   HGBA1C 6.5 03/07/2012  Medications taking and tolerating-yes Blood Sugars per patient-fasting-100-150  ROS- Denies Polyuria,Polydipsia, nocturia, Vision changes. Denies Hypoglycemia symptoms (shaky, sweaty, hungry, weak anxious, tremor, palpitations, confusion, behavior change). Does have numbness on bottom of feet at times to light touch.   Penis irritation and redness Started to 2-3 days ago. Redness primarily on fore skin your penis head. Has occurred before and improved on Lotrisone.  ROS-no fever chills. No vesicles  Chronic back pain-reasonable control Using Aleve patient is down to using Percocet twice a day. ROS-no fecal or urinary incontinence  Past Medical History- Patient Active Problem List   Diagnosis Date Noted  . ILD (interstitial lung disease) 07/05/2012    Priority: High  . COPD UNSPECIFIED 12/15/2009    Priority: High  . BACK PAIN, CHRONIC 05/02/2007    Priority: High  . DM w/o Complication Type II 99991111    Priority: High  . HYPERTENSION 04/06/2007    Priority: Medium  . DEGENERATIVE JOINT DISEASE 04/06/2007    Priority: Medium  . 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 BENIGN PROSTATIC HYPERTROPHY 06/20/2007  . DEPRESSION 05/02/2007  . HYPERLIPIDEMIA 04/06/2007   Medications- reviewed and updated Current Outpatient Prescriptions  Medication Sig Dispense Refill  . amitriptyline (ELAVIL) 50 MG tablet Take 1 tablet (50 mg total) by mouth at bedtime.  90 tablet  3  . amLODipine (NORVASC) 5 MG tablet Take 1 tablet (5 mg total) by mouth daily.  90 tablet  3  . aspirin 81 MG tablet Take 81 mg by mouth daily.        . Cholecalciferol 1000 UNITS capsule Take 1  capsule (1,000 Units total) by mouth 2 (two) times daily.      . cyanocobalamin (,VITAMIN B-12,) 1000 MCG/ML injection Inject 1 mL (1,000 mcg total) into the muscle once.  1 mL  0  . famotidine (PEPCID) 20 MG tablet Take 1 tablet (20 mg total) by mouth at bedtime as needed for heartburn. One at bedtime  90 tablet  3  . finasteride (PROSCAR) 5 MG tablet Take 1 tablet (5 mg total) by mouth daily.  90 tablet  3  . FLUoxetine (PROZAC) 20 MG tablet Take 1 tablet (20 mg total) by mouth every morning.  90 tablet  3  . gabapentin (NEURONTIN) 300 MG capsule Take 1 capsule (300 mg total) by mouth 2 (two) times daily.  180 capsule  3  . glyBURIDE-metformin (GLUCOVANCE) 2.5-500 MG per tablet Take 1 tablet by mouth daily with breakfast.  90 tablet  3  . Multiple Vitamin (MULTIVITAMIN) capsule Take 1 capsule by mouth daily.        . naproxen (NAPROSYN) 500 MG tablet Take 1 tablet (500 mg total) by mouth 2 (two) times daily with a meal.  180 tablet  3  . omeprazole (PRILOSEC) 20 MG capsule Take one 30-60 min before first meal of the day  90 capsule  3  . oxyCODONE-acetaminophen (PERCOCET) 7.5-325 MG per tablet Take 1 tablet by mouth every 6 (six) hours as needed for pain.  110 tablet  0  . tamsulosin (FLOMAX) 0.4 MG CAPS capsule Take 1 capsule (0.4 mg total) by mouth daily.  [place]  capsule  3  . TRUEPLUS LANCETS 28G MISC TEST TWO TIMES DAILY  200 each  3  . valACYclovir (VALTREX) 500 MG tablet Take 1 tablet (500 mg total) by mouth 2 (two) times daily.  60 tablet  11  . clotrimazole (LOTRIMIN) 1 % cream Apply 1 application topically 2 (two) times daily.  30 g  0  . clotrimazole-betamethasone (LOTRISONE) cream Apply 1 application topically 2 (two) times daily.  30 g  0   No current facility-administered medications for this visit.    Objective: BP 130/70  Pulse 80  Temp(Src) 98.1 F (36.7 C)  Wt 208 lb (94.348 kg) Gen: NAD, resting comfortably in chair CV: RRR no murmurs rubs or gallops Lungs: CTAB no  crackles, wheeze, rhonchi Abdomen: soft/nontender/nondistended/normal bowel sounds.  Ext: no edema GU-erythematous skin with some whitish discoloration on fore skin. Skin: warm, dry, no rash Neuro: grossly normal except DM foot exam, moves all extremities DM foot exam: 2 small callous on base of 1st toe bilaterally, onychomycosis, no sensation on bottom of feet from monofilament, 2+ DP pulses   Assessment/Plan:  DM w/o Complication Type II Reasonable control with last A1c 7.1. Continue glyburide-metformin. Goal A1c less than 7.5  Candidal balanoposthitis Recurrent issue. Currently poorly controlled. Treat with Lotrisone or clotrimazole as likely candidal  BACK PAIN, CHRONIC Reduced Percocet usage by about 50% with addition of Aleve. Will continue to use Percocet 7/325 but only 2 pills per day-patient can use a half pill if needed. Return in one month. Sign pain contract at that time. Urine drug screen today   Orders Placed This Encounter  Procedures  . Microalbumin / creatinine urine ratio    [place]  . Drug Screen, Urine    Meds ordered this encounter  Medications  . clotrimazole (LOTRIMIN) 1 % cream    Sig: Apply 1 application topically 2 (two) times daily.    Dispense:  30 g    Refill:  0  . clotrimazole-betamethasone (LOTRISONE) cream    Sig: Apply 1 application topically 2 (two) times daily.    Dispense:  30 g    Refill:  0    Please give instead of clotrimazole alone if patient hasn't picked up clotrimazole yet.

## 2014-06-20 NOTE — Assessment & Plan Note (Signed)
Reasonable control with last A1c 7.1. Continue glyburide-metformin. Goal A1c less than 7.5

## 2014-06-20 NOTE — Patient Instructions (Addendum)
Penis infection from yeast-use cream twice a day for at least 5 days. May use a few days longer if not completely cleared.   Diabetes-looks good. Goal a1c <7.5 and you were 7.1. rechedk 3-6 months  Chronic Pain-see me back 4-6 weeks. Our plan is for you to use 2 perocet a day (may take 1/2 at a time if needed) and continue aleve in the morning. Drug screen today per standard of care. Sign pain contract next visit.   Since you have had your urine evaluated in the past, we are not going to be aggressive in investigating especially since you still urinate  Health Maintenance Due  Topic Date Due  . Urine Microalbumin -today 08/22/2012  . Influenza Vaccine -today 05/10/2014

## 2014-06-21 LAB — DRUG SCREEN, URINE
Amphetamine Screen, Ur: NEGATIVE
Barbiturate Quant, Ur: NEGATIVE
Benzodiazepines.: NEGATIVE
COCAINE METABOLITES: NEGATIVE
Creatinine,U: 83.74 mg/dL
MARIJUANA METABOLITE: NEGATIVE
Methadone: NEGATIVE
OPIATES: NEGATIVE
PHENCYCLIDINE (PCP): NEGATIVE
Propoxyphene: NEGATIVE

## 2014-07-25 ENCOUNTER — Ambulatory Visit (INDEPENDENT_AMBULATORY_CARE_PROVIDER_SITE_OTHER): Payer: Commercial Managed Care - HMO | Admitting: Family Medicine

## 2014-07-25 ENCOUNTER — Encounter: Payer: Self-pay | Admitting: Family Medicine

## 2014-07-25 ENCOUNTER — Encounter: Payer: Self-pay | Admitting: *Deleted

## 2014-07-25 VITALS — BP 130/64 | HR 80 | Temp 97.5°F | Wt 205.0 lb

## 2014-07-25 DIAGNOSIS — M545 Low back pain, unspecified: Secondary | ICD-10-CM

## 2014-07-25 MED ORDER — OXYCODONE-ACETAMINOPHEN 7.5-325 MG PO TABS: 1.0000 | ORAL_TABLET | Freq: Four times a day (QID) | ORAL | Status: DC | PRN

## 2014-07-25 NOTE — Progress Notes (Signed)
[name], MD Phone: [phone]  Subjective:   [name] is a 73 y.o. year old very pleasant male patient who presents with the following:  Chronic low back pain-stable Tried aleve in AM but still had same requirement for percocet after about a month of this trial. Taking about 3x a day on most days.   ROS-No saddle anesthesia, bladder incontinence, fecal incontinence, weakness in extremity, numbness or tingling in extremity.  Past Medical History- Patient Active Problem List   Diagnosis Date Noted  . ILD (interstitial lung disease) 07/05/2012    Priority: High  . COPD UNSPECIFIED 12/15/2009    Priority: High  . Chronic back pain. Full history 06/12/14. Pain contract signed.  05/02/2007    Priority: High  . DM w/o Complication Type II 99991111    Priority: High  . HYPERTENSION 04/06/2007    Priority: Medium  . DEGENERATIVE JOINT DISEASE 04/06/2007    Priority: Medium  . 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 BENIGN PROSTATIC HYPERTROPHY 06/20/2007  . DEPRESSION 05/02/2007  . HYPERLIPIDEMIA 04/06/2007   Medications- reviewed and updated Current Outpatient Prescriptions  Medication Sig Dispense Refill  . amitriptyline (ELAVIL) 50 MG tablet Take 1 tablet (50 mg total) by mouth at bedtime.  90 tablet  3  . amLODipine (NORVASC) 5 MG tablet Take 1 tablet (5 mg total) by mouth daily.  90 tablet  3  . aspirin 81 MG tablet Take 81 mg by mouth daily.        . Cholecalciferol 1000 UNITS capsule Take 1 capsule (1,000 Units total) by mouth 2 (two) times daily.      . clotrimazole-betamethasone (LOTRISONE) cream Apply 1 application topically 2 (two) times daily.  30 g  0  . cyanocobalamin (,VITAMIN B-12,) 1000 MCG/ML injection Inject 1 mL (1,000 mcg total) into the muscle once.  1 mL  0  . famotidine (PEPCID) 20 MG tablet Take 1 tablet (20 mg total) by mouth at bedtime as needed for heartburn. One at bedtime  90 tablet  3  . finasteride (PROSCAR) 5 MG  tablet Take 1 tablet (5 mg total) by mouth daily.  90 tablet  3  . FLUoxetine (PROZAC) 20 MG tablet Take 1 tablet (20 mg total) by mouth every morning.  90 tablet  3  . gabapentin (NEURONTIN) 300 MG capsule Take 1 capsule (300 mg total) by mouth 2 (two) times daily.  180 capsule  3  . glyBURIDE-metformin (GLUCOVANCE) 2.5-500 MG per tablet Take 1 tablet by mouth daily with breakfast.  90 tablet  3  . Multiple Vitamin (MULTIVITAMIN) capsule Take 1 capsule by mouth daily.        . naproxen (NAPROSYN) 500 MG tablet Take 1 tablet (500 mg total) by mouth 2 (two) times daily with a meal.  180 tablet  3  . omeprazole (PRILOSEC) 20 MG capsule Take one 30-60 min before first meal of the day  90 capsule  3  . tamsulosin (FLOMAX) 0.4 MG CAPS capsule Take 1 capsule (0.4 mg total) by mouth daily.  90 capsule  3  . TRUEPLUS LANCETS 28G MISC TEST TWO TIMES DAILY  200 each  3  . clotrimazole (LOTRIMIN) 1 % cream Apply 1 application topically 2 (two) times daily.  30 g  0  . oxyCODONE-acetaminophen (PERCOCET) 7.5-325 MG per tablet Take 1 tablet by mouth every 6 (six) hours as needed for pain (3x day max. May fill 09/25/14.).  120 tablet  0  . valACYclovir (  VALTREX) 500 MG tablet Take 1 tablet (500 mg total) by mouth 2 (two) times daily.  60 tablet  11   No current facility-administered medications for this visit.    Objective: BP 130/64  Pulse 80  Temp(Src) 97.5 F (36.4 C)  Wt 205 lb (92.987 kg) Gen: NAD, resting comfortably in chair   Assessment/Plan:  Chronic back pain. Full history 06/12/14. Pain contract signed.  Aleve no longer helping. DC Aleve. Continue Percocet 7.5-325 one tablet by mouth 3 times a day maximum. We'll need to decrease monthly dispense #90 for future visits. Technically 4 months of refills given so next fill date should be 11/24/2013. Asked patient to followup in 3 months   >50% of 20 minute office visit was spent on counseling (need to not increase narcotics, benefits/risk of  narcotics) and coordination of care (pain contract signing and explanation  Meds ordered this encounter  Medications  . DISCONTD: oxyCODONE-acetaminophen (PERCOCET) 7.5-325 MG per tablet    Sig: Take 1 tablet by mouth every 6 (six) hours as needed for pain (3x day max. May fill 07/26/14.).    Dispense:  120 tablet    Refill:  0  . DISCONTD: oxyCODONE-acetaminophen (PERCOCET) 7.5-325 MG per tablet    Sig: Take 1 tablet by mouth every 6 (six) hours as needed for pain (3x day max. May fill 08/26/14.).    Dispense:  120 tablet    Refill:  0  . oxyCODONE-acetaminophen (PERCOCET) 7.5-325 MG per tablet    Sig: Take 1 tablet by mouth every 6 (six) hours as needed for pain (3x day max. May fill 09/25/14.).    Dispense:  120 tablet    Refill:  0

## 2014-07-25 NOTE — Patient Instructions (Signed)
Stop taking aleve. We trialed it and it did not seem to cut down your percocet use.   See me in 3 months. Refilled 3 months pain medicine.   If your requirement ever increases, will send you to pain management.   Signed pain contract today.

## 2014-07-25 NOTE — Assessment & Plan Note (Signed)
Aleve no longer helping. DC Aleve. Continue Percocet 7.5-325 one tablet by mouth 3 times a day maximum. We'll need to decrease monthly dispense #90 for future visits. Technically 4 months of refills given so next fill date should be 11/24/2013. Asked patient to followup in 3 months

## 2014-08-06 ENCOUNTER — Ambulatory Visit: Payer: Commercial Managed Care - HMO | Admitting: Family Medicine

## 2014-10-22 DIAGNOSIS — Z947 Corneal transplant status: Secondary | ICD-10-CM | POA: Diagnosis not present

## 2014-10-22 DIAGNOSIS — H2512 Age-related nuclear cataract, left eye: Secondary | ICD-10-CM | POA: Diagnosis not present

## 2014-10-22 DIAGNOSIS — Z961 Presence of intraocular lens: Secondary | ICD-10-CM | POA: Diagnosis not present

## 2014-11-04 ENCOUNTER — Ambulatory Visit (INDEPENDENT_AMBULATORY_CARE_PROVIDER_SITE_OTHER): Payer: Commercial Managed Care - HMO | Admitting: Family Medicine

## 2014-11-04 ENCOUNTER — Encounter: Payer: Self-pay | Admitting: Family Medicine

## 2014-11-04 VITALS — BP 122/64 | Temp 97.6°F | Wt 210.0 lb

## 2014-11-04 DIAGNOSIS — E119 Type 2 diabetes mellitus without complications: Secondary | ICD-10-CM | POA: Diagnosis not present

## 2014-11-04 DIAGNOSIS — I10 Essential (primary) hypertension: Secondary | ICD-10-CM

## 2014-11-04 DIAGNOSIS — K760 Fatty (change of) liver, not elsewhere classified: Secondary | ICD-10-CM | POA: Insufficient documentation

## 2014-11-04 DIAGNOSIS — N481 Balanitis: Secondary | ICD-10-CM

## 2014-11-04 DIAGNOSIS — E785 Hyperlipidemia, unspecified: Secondary | ICD-10-CM | POA: Diagnosis not present

## 2014-11-04 DIAGNOSIS — Q541 Hypospadias, penile: Secondary | ICD-10-CM | POA: Diagnosis not present

## 2014-11-04 DIAGNOSIS — R1900 Intra-abdominal and pelvic swelling, mass and lump, unspecified site: Secondary | ICD-10-CM

## 2014-11-04 LAB — COMPREHENSIVE METABOLIC PANEL
ALBUMIN: 3.3 g/dL — AB (ref 3.5–5.2)
ALK PHOS: 130 U/L — AB (ref 39–117)
ALT: 9 U/L (ref 0–53)
AST: 18 U/L (ref 0–37)
BILIRUBIN TOTAL: 0.3 mg/dL (ref 0.2–1.2)
BUN: 18 mg/dL (ref 6–23)
CHLORIDE: 102 meq/L (ref 96–112)
CO2: 28 meq/L (ref 19–32)
CREATININE: 1.29 mg/dL (ref 0.40–1.50)
Calcium: 8.9 mg/dL (ref 8.4–10.5)
GFR: 57.9 mL/min — ABNORMAL LOW (ref >60.00)
GLUCOSE: 271 mg/dL — AB (ref 70–99)
POTASSIUM: 4.5 meq/L (ref 3.5–5.1)
SODIUM: 137 meq/L (ref 135–145)
Total Protein: 6.9 g/dL (ref 6.0–8.3)

## 2014-11-04 LAB — HEMOGLOBIN A1C: Hgb A1c MFr Bld: 7.6 % — ABNORMAL HIGH (ref 4.6–6.5)

## 2014-11-04 LAB — PROTIME-INR
INR: 1.1 ratio — ABNORMAL HIGH (ref 0.8–1.0)
Prothrombin Time: 11.9 s (ref 9.6–13.1)

## 2014-11-04 NOTE — Assessment & Plan Note (Signed)
Patient interested in starting statin given DM. His last ldl was <100 but priors have been in 120s. I agreed would likely benefit himto lower LDL below 70 at least. crestor 10mg  planned after liver evaluation.

## 2014-11-04 NOTE — Assessment & Plan Note (Addendum)
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 No recent worsening. Abdomen is protuberant in comparison to rather thin legs. Fatty liver on last CT chest. Get abdominal US to evaluate for ascites and consider IR for drainage for further information. CMET, pt/inr today as well.

## 2014-11-04 NOTE — Progress Notes (Signed)
[name], MD Phone: [phone]  Subjective:   [name] is a 74 y.o. 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 Has had abdominal swelling since that time. His legs are thin but belly is protuberant. Took a fluid pill daughter had and that seemed to improve it. He is wondering if he should be on chronic lasix/fluid pill. CT chest last year showed fatty liver.  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 Has responded to lotrimin in the past. Has recurred yet again. Patient would like to speak to a urologist about this condition as well as a "funny smell" to his urine.  Hyperlipidemia-controlled on last check  Lab Results  Component Value Date   LDLCALC 73 06/12/2014  On statin: no, advised by [facility] pharmacist to start Regular exercise: no ROS- no chest pain or shortness of breath. No myalgias  Hypertension-controlled on amlodipine  BP Readings from Last 3 Encounters:  11/04/14 122/64  07/25/14 130/64  06/20/14 130/70  Home BP monitoring-no Compliant with medications-yes without side effects ROS-Denies any CP, HA, SOB, blurry vision, LE edema.    Past Medical History- Patient Active Problem List   Diagnosis Date Noted  . ILD (interstitial lung disease) 07/05/2012    Priority: High  . COPD UNSPECIFIED 12/15/2009    Priority: High  . Chronic back pain. Full history 06/12/14. Pain contract signed.  05/02/2007    Priority: High  . Diabetes mellitus type II, controlled 04/06/2007    Priority: High  . BPH (benign prostatic hyperplasia) 06/20/2007    Priority: Medium  . Depression 05/02/2007    Priority: Medium  . Hyperlipidemia 04/06/2007    Priority: Medium  . Essential hypertension 04/06/2007    Priority: Medium  . DEGENERATIVE JOINT DISEASE 04/06/2007    Priority: Medium  . 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 Abdominal swelling 11/04/2014   Medications- reviewed and updated Current Outpatient Prescriptions  Medication Sig Dispense Refill  . amitriptyline (ELAVIL) 50 MG tablet Take 1 tablet (50 mg total) by mouth at bedtime. 90 tablet 3  . amLODipine (NORVASC) 5 MG tablet Take 1 tablet (5 mg total) by mouth daily. 90 tablet 3  . aspirin 81 MG tablet Take 81 mg by mouth daily.      . Cholecalciferol 1000 UNITS capsule Take 1 capsule (1,000 Units total) by mouth 2 (two) times daily.    . clotrimazole (LOTRIMIN) 1 % cream Apply 1 application topically 2 (two) times daily. 30 g 0  . clotrimazole-betamethasone (LOTRISONE) cream Apply 1 application topically 2 (two) times daily. 30 g 0  . cyanocobalamin (,VITAMIN B-12,) 1000 MCG/ML injection Inject 1 mL (1,000 mcg total) into the muscle once. 1 mL 0  . famotidine (PEPCID) 20 MG tablet Take 1 tablet (20 mg total) by mouth at bedtime as needed for heartburn. One at bedtime 90 tablet 3  . finasteride (PROSCAR) 5 MG tablet Take 1 tablet (5 mg total) by mouth daily. 90 tablet 3  . FLUoxetine (PROZAC) 20 MG tablet Take 1 tablet (20 mg total) by mouth every morning. 90 tablet 3  . gabapentin (NEURONTIN) 300 MG capsule Take 1 capsule (300 mg total) by mouth 2 (two) times daily. 180 capsule 3  . glyBURIDE-metformin (GLUCOVANCE) 2.5-500 MG per tablet Take 1 tablet by mouth daily with breakfast. 90 tablet 3  . Multiple Vitamin (  MULTIVITAMIN) capsule Take 1 capsule by mouth daily.      . naproxen (NAPROSYN) 500 MG tablet Take 1 tablet (500 mg total) by mouth 2 (two) times daily with a meal. 180 tablet 3  . omeprazole (PRILOSEC) 20 MG capsule Take one 30-60 min before first meal of the day 90 capsule 3  . tamsulosin (FLOMAX) 0.4 MG CAPS capsule Take 1 capsule (0.4 mg total) by mouth daily. 90 capsule 3  . TRUEPLUS LANCETS 28G MISC TEST TWO TIMES DAILY 200 each 3  . valACYclovir (VALTREX) 500 MG tablet Take 1 tablet (500 mg total) by mouth 2 (two) times daily.  60 tablet 11  . oxyCODONE-acetaminophen (PERCOCET) 7.5-325 MG per tablet Take 1 tablet by mouth every 6 (six) hours as needed for pain (3x day max. May fill 09/25/14.). (Patient not taking: Reported on 11/04/2014) 120 tablet 0   No current facility-administered medications for this visit.    Objective: BP 122/64 mmHg  Temp(Src) 97.6 F (36.4 C)  Wt 210 lb (95.255 kg) Gen: NAD, resting comfortably CV: RRR no murmurs rubs or gallops Lungs: CTAB no crackles, wheeze, rhonchi Abdomen: protuberant abdomen difficult to assess for hepatosplenomegaly, fluid wave noted. soft/nontendernormal bowel sounds. No rebound or guarding.  GU: glans penis with multiple erythematous papules extending onto foreskin. Appears to have hypospadias.  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 No recent worsening. Abdomen is protuberant in comparison to rather thin legs. Fatty liver on last CT chest. Get abdominal US to evaluate for ascites and consider IR for drainage for further information. CMET, pt/inr today as well.    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 Have referred to urology per patient preference. He declines workup by PCP given recurrent issue.    Hyperlipidemia Patient interested in starting statin given DM. His last ldl was <100 but priors have been in 120s. I agreed would likely benefit himto lower LDL below 70 at least. crestor 10mg  planned after liver evaluation.    Essential hypertension Wants to switch to ace i or arb due to letter from [facility]. Discussed would need to make sure potassium ok as mild elevation on last check. If remains elevated, may need further evaluation of potassium and hold of on change.    Return precautions advised. Follow up within a week to discuss pain management. Patient has gone through supply meant  to last through 2/15 almost entirely. He is to use 1/2 a pill up to 3x a day before seeing me back next week. Will need to review pain contract with him  Orders Placed This Encounter  Procedures  . US Abdomen Complete    Standing Status: Future     Number of Occurrences:      Standing Expiration Date: 01/03/2016    Order Specific Question:  Reason for Exam (SYMPTOM  OR DIAGNOSIS REQUIRED)    Answer:  Abdominal swelling, former alcoholic, likely fatty liver on MRI chest. may pursue IR paracentesis if ascites.    Order Specific Question:  Preferred imaging location?    Answer:  [address]  . Comprehensive metabolic panel    [place]  . Protime-INR  . Hemoglobin A1c    [place]  . Ambulatory referral to Urology    Referral Priority:  Routine    Referral Type:  Consultation    Referral Reason:  Specialty Services Required  Requested Specialty:  Urology    Number of Visits Requested:  1

## 2014-11-04 NOTE — Patient Instructions (Addendum)
Abdominal fluid-further evaluate with ultrasound, may need to pull fluid off with needle to further evaluate. Lbas today as well.  Send to urologist to evaluate your recurrent issues with penile infections, stinky urine  If your liver function tests are ok, I will order you a cholesterol medicine called atorvastatin 10mg  which you will take once a day.   We could consider a medicine to protect your kidneys if your potassium is not running high.   Check a1c today  You need to return to see me next week to focus on pain management. Use half pills three times a day until you get to that visit.

## 2014-11-04 NOTE — Assessment & Plan Note (Signed)
Wants to switch to ace i or arb due to letter from [facility]. Discussed would need to make sure potassium ok as mild elevation on last check. If remains elevated, may need further evaluation of potassium and hold of on change.

## 2014-11-04 NOTE — Assessment & Plan Note (Signed)
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 Have referred to urology per patient preference. He declines workup by PCP given recurrent issue.

## 2014-11-11 ENCOUNTER — Ambulatory Visit
Admission: RE | Admit: 2014-11-11 | Discharge: 2014-11-11 | Disposition: A | Discharge status: Home / Self Care | Payer: Commercial Managed Care - HMO | Source: Ambulatory Visit | Attending: Family Medicine | Admitting: Family Medicine

## 2014-11-11 DIAGNOSIS — R1900 Intra-abdominal and pelvic swelling, mass and lump, unspecified site: Secondary | ICD-10-CM

## 2014-11-11 DIAGNOSIS — K760 Fatty (change of) liver, not elsewhere classified: Secondary | ICD-10-CM | POA: Diagnosis not present

## 2014-11-12 ENCOUNTER — Ambulatory Visit (INDEPENDENT_AMBULATORY_CARE_PROVIDER_SITE_OTHER): Payer: Commercial Managed Care - HMO | Admitting: Family Medicine

## 2014-11-12 ENCOUNTER — Encounter: Payer: Self-pay | Admitting: Family Medicine

## 2014-11-12 VITALS — BP 142/68 | Temp 98.0°F | Wt 218.0 lb

## 2014-11-12 DIAGNOSIS — M549 Dorsalgia, unspecified: Secondary | ICD-10-CM

## 2014-11-12 DIAGNOSIS — E1165 Type 2 diabetes mellitus with hyperglycemia: Secondary | ICD-10-CM | POA: Diagnosis not present

## 2014-11-12 DIAGNOSIS — K760 Fatty (change of) liver, not elsewhere classified: Secondary | ICD-10-CM

## 2014-11-12 DIAGNOSIS — F329 Major depressive disorder, single episode, unspecified: Secondary | ICD-10-CM | POA: Diagnosis not present

## 2014-11-12 DIAGNOSIS — IMO0002 Reserved for concepts with insufficient information to code with codable children: Secondary | ICD-10-CM

## 2014-11-12 DIAGNOSIS — N400 Enlarged prostate without lower urinary tract symptoms: Secondary | ICD-10-CM | POA: Diagnosis not present

## 2014-11-12 DIAGNOSIS — K219 Gastro-esophageal reflux disease without esophagitis: Secondary | ICD-10-CM | POA: Diagnosis not present

## 2014-11-12 DIAGNOSIS — I159 Secondary hypertension, unspecified: Secondary | ICD-10-CM

## 2014-11-12 DIAGNOSIS — G4700 Insomnia, unspecified: Secondary | ICD-10-CM

## 2014-11-12 DIAGNOSIS — F32A Depression, unspecified: Secondary | ICD-10-CM

## 2014-11-12 DIAGNOSIS — I10 Essential (primary) hypertension: Secondary | ICD-10-CM | POA: Diagnosis not present

## 2014-11-12 DIAGNOSIS — G8929 Other chronic pain: Secondary | ICD-10-CM

## 2014-11-12 MED ORDER — LISINOPRIL 10 MG PO TABS: 10.0000 mg | ORAL_TABLET | Freq: Every day | ORAL | Status: DC

## 2014-11-12 MED ORDER — GLYBURIDE-METFORMIN 2.5-500 MG PO TABS: 1.0000 | ORAL_TABLET | Freq: Every day | ORAL | Status: DC

## 2014-11-12 MED ORDER — AMLODIPINE BESYLATE 5 MG PO TABS: 5.0000 mg | ORAL_TABLET | Freq: Every day | ORAL | Status: DC

## 2014-11-12 MED ORDER — OXYCODONE-ACETAMINOPHEN 7.5-325 MG PO TABS: 1.0000 | ORAL_TABLET | Freq: Four times a day (QID) | ORAL | Status: DC | PRN

## 2014-11-12 MED ORDER — FINASTERIDE 5 MG PO TABS: 5.0000 mg | ORAL_TABLET | Freq: Every day | ORAL | Status: DC

## 2014-11-12 MED ORDER — OMEPRAZOLE 20 MG PO CPDR: DELAYED_RELEASE_CAPSULE | ORAL | Status: DC

## 2014-11-12 MED ORDER — FLUOXETINE HCL 20 MG PO TABS: 20.0000 mg | ORAL_TABLET | ORAL | Status: DC

## 2014-11-12 MED ORDER — AMITRIPTYLINE HCL 50 MG PO TABS: 50.0000 mg | ORAL_TABLET | Freq: Every day | ORAL | Status: DC

## 2014-11-12 NOTE — Progress Notes (Signed)
[name], MD Phone: [phone]  Subjective:   [name] is a 74 y.o. year old very pleasant male patient who presents with the following:  Fatty liver-new Patient was concerned of abdominal swelling. Ultrasound showed fatty liver and no ascites.  ROS- no RUQ pain, does have some blaoting  Hypertension-mild poor control  BP Readings from Last 3 Encounters:  11/12/14 142/68  11/04/14 122/64  07/25/14 130/64  Home BP monitoring-no Compliant with medications-yes without side effects ROS-Denies any CP, HA, SOB, blurry vision.   Chronic back pain Controlled reasonably well with 4 tablets a day of percocet above prescribed 3 amount. Ran out early as a result and patient requesting early refill.   DIABETES Type II-worsening control  Lab Results  Component Value Date   HGBA1C 7.6* 11/04/2014   HGBA1C 7.1* 06/12/2014   HGBA1C 7.0* 07/01/2013  compliant with glyburide metformin. Not exercising or eating well ROS- Denies vision changes or hypoglycemia  Past Medical History- Patient Active Problem List   Diagnosis Date Noted  . ILD (interstitial lung disease) 07/05/2012    Priority: High  . COPD UNSPECIFIED 12/15/2009    Priority: High  . Chronic back pain. Full history 06/12/14. Pain contract signed.  05/02/2007    Priority: High  . Diabetes mellitus type II, controlled 04/06/2007    Priority: High  . Fatty liver 11/04/2014    Priority: Medium  . BPH (benign prostatic hyperplasia) 06/20/2007    Priority: Medium  . Depression 05/02/2007    Priority: Medium  . Hyperlipidemia 04/06/2007    Priority: Medium  . Essential hypertension 04/06/2007    Priority: Medium  . DEGENERATIVE JOINT DISEASE 04/06/2007    Priority: Medium  . Candidal balanoposthitis 06/20/2014    Priority: Low   Medications- reviewed and updated Current Outpatient Prescriptions  Medication Sig Dispense Refill  . amitriptyline (ELAVIL) 50 MG tablet Take 1 tablet (50 mg total) by mouth at  bedtime. 90 tablet 3  . amLODipine (NORVASC) 5 MG tablet Take 1 tablet (5 mg total) by mouth daily. 90 tablet 3  . aspirin 81 MG tablet Take 81 mg by mouth daily.      . Cholecalciferol 1000 UNITS capsule Take 1 capsule (1,000 Units total) by mouth 2 (two) times daily.    . clotrimazole (LOTRIMIN) 1 % cream Apply 1 application topically 2 (two) times daily. 30 g 0  . clotrimazole-betamethasone (LOTRISONE) cream Apply 1 application topically 2 (two) times daily. 30 g 0  . cyanocobalamin (,VITAMIN B-12,) 1000 MCG/ML injection Inject 1 mL (1,000 mcg total) into the muscle once. 1 mL 0  . famotidine (PEPCID) 20 MG tablet Take 1 tablet (20 mg total) by mouth at bedtime as needed for heartburn. One at bedtime 90 tablet 3  . finasteride (PROSCAR) 5 MG tablet Take 1 tablet (5 mg total) by mouth daily. 90 tablet 3  . FLUoxetine (PROZAC) 20 MG tablet Take 1 tablet (20 mg total) by mouth every morning. 90 tablet 3  . gabapentin (NEURONTIN) 300 MG capsule Take 1 capsule (300 mg total) by mouth 2 (two) times daily. 180 capsule 3  . glyBURIDE-metformin (GLUCOVANCE) 2.5-500 MG per tablet Take 1 tablet by mouth daily with breakfast. 90 tablet 3  . Multiple Vitamin (MULTIVITAMIN) capsule Take 1 capsule by mouth daily.      . naproxen (NAPROSYN) 500 MG tablet Take 1 tablet (500 mg total) by mouth 2 (two) times daily with a meal. 180 tablet 3  . omeprazole (PRILOSEC) 20 MG capsule  Take one 30-60 min before first meal of the day 90 capsule 3  . tamsulosin (FLOMAX) 0.4 MG CAPS capsule Take 1 capsule (0.4 mg total) by mouth daily. 90 capsule 3  . TRUEPLUS LANCETS 28G MISC TEST TWO TIMES DAILY 200 each 3  . valACYclovir (VALTREX) 500 MG tablet Take 1 tablet (500 mg total) by mouth 2 (two) times daily. 60 tablet 11  . oxyCODONE-acetaminophen (PERCOCET) 7.5-325 MG per tablet Take 1 tablet by mouth every 6 (six) hours as needed for pain (3x day max. May fill 09/25/14.). (Patient not taking: Reported on 11/04/2014) 120 tablet  0   Objective: BP 142/68 mmHg  Temp(Src) 98 F (36.7 C)  Wt 218 lb (98.884 kg) Gen: NAD, resting comfortably CV: RRR no murmurs rubs or gallops Lungs: CTAB no crackles, wheeze, rhonchi Abdomen: obese/protuberant abdomen Ext: no edema Skin: warm, dry, no rash   Assessment/Plan:  Fatty liver No obvious cirrhosis based on ultrasound and labs. I advised patient he needs to lose weight/exercise to prevent progression to cirrhosis and ultimately potentially cancer.    Essential hypertension Elevated today. Already on amlodipine 5mg . Received letter from [facility] about ace i or arb. I agreed we should start lisinopril to control BP and to reduce risk of damage to kidneys from diabtes. Lisinopril 10mg  started today. Follow up bmet in 2 weeks.    Chronic back pain. Full history 06/12/14. Pain contract signed.  For chronic back pian, I refilled percocet for 3 months. I reviewed pain contract and discussed that early requests in future or escalating needs would be grounds to terminate all future rx from this office for narcotics. Would still consider pain management referral.    Diabetes mellitus type II, controlled a1c trending up on Glyburide-metformin 2.5-500 daily.Marland Kitchen i strongly advised exercise/healthier eating. Patient will follow up in 3 months and if a1c above 7.5 despite push for healthier lifestyle for DM and fatty liver, would make glyburide metformin BID.     Return precautions advised. 3 month follow up planned. Discussed may need 2 separate visits depending on needs at next visit. Patient needs to be on statin as well but would need to monitor LFTs closely with fatty liver and history alcohol abuse.   Orders Placed This Encounter  Procedures  . Basic metabolic panel    [place]    Standing Status: Future     Number of Occurrences:      Standing Expiration Date: 11/13/2015    Meds ordered this encounter  Medications  .  oxyCODONE-acetaminophen (PERCOCET) 7.5-325 MG per tablet      Sig: Take 1 tablet by mouth every 6 (six) hours as needed for pain (3x day max. May fill 11/12/14.).    Dispense:  90 tablet    Refill:  0  .  oxyCODONE-acetaminophen (PERCOCET) 7.5-325 MG per tablet    Sig: Take 1 tablet by mouth every 6 (six) hours as needed for pain (3x day max. May fill 12/11/14.).    Dispense:  90 tablet    Refill:  0  . oxyCODONE-acetaminophen (PERCOCET) 7.5-325 MG per tablet    Sig: Take 1 tablet by mouth every 6 (six) hours as needed for pain (3x day max. May fill 01/11/15.).    Dispense:  90 tablet    Refill:  0  . lisinopril (PRINIVIL,ZESTRIL) 10 MG tablet    Sig: Take 1 tablet (10 mg total) by mouth daily.    Dispense:  90 tablet    Refill:  3  . amitriptyline (  ELAVIL) 50 MG tablet    Sig: Take 1 tablet (50 mg total) by mouth at bedtime.    Dispense:  90 tablet    Refill:  3  . amLODipine (NORVASC) 5 MG tablet    Sig: Take 1 tablet (5 mg total) by mouth daily.    Dispense:  90 tablet    Refill:  3  . finasteride (PROSCAR) 5 MG tablet    Sig: Take 1 tablet (5 mg total) by mouth daily.    Dispense:  90 tablet    Refill:  3  . FLUoxetine (PROZAC) 20 MG tablet    Sig: Take 1 tablet (20 mg total) by mouth every morning.    Dispense:  90 tablet    Refill:  3  . glyBURIDE-metformin (GLUCOVANCE) 2.5-500 MG per tablet    Sig: Take 1 tablet by mouth daily with breakfast.    Dispense:  90 tablet    Refill:  3  . omeprazole (PRILOSEC) 20 MG capsule    Sig: Take one 30-60 min before first meal of the day    Dispense:  90 capsule    Refill:  3

## 2014-11-12 NOTE — Assessment & Plan Note (Signed)
a1c trending up on Glyburide-metformin 2.5-500 daily.Marland Kitchen i strongly advised exercise/healthier eating. Patient will follow up in 3 months and if a1c above 7.5 despite push for healthier lifestyle for DM and fatty liver, would make glyburide metformin BID.

## 2014-11-12 NOTE — Assessment & Plan Note (Signed)
Elevated today. Already on amlodipine 5mg . Received letter from [facility] about ace i or arb. I agreed we should start lisinopril to control BP and to reduce risk of damage to kidneys from diabtes. Lisinopril 10mg  started today. Follow up bmet in 2 weeks.

## 2014-11-12 NOTE — Patient Instructions (Addendum)
Start lisinopril as advised by [facility] to protect your kidneys. Need to follow up in 2 weeks with our lab to make sure kidney function stable. Remember no aleve/ibuprofen.   Refilled pain medicine. 3 months worth. Will not fill early.   Diabetes is poorly controlled with a1c 7.6. I advise you to start regular exercise at least 5 minutes walking daily and increase to 30 minutes as able. Stop and come see [place] immediately if any chest pain, shortness of breath.   You have a fatty liver as cause of swelling. Weight loss is needed. Fluid pills would not help.   Follow up in 3 months. May need 2 visits given multiple medical problems.    Fatty Liver Fatty liver is the accumulation of fat in liver cells. It is also called hepatosteatosis or steatohepatitis. It is normal for your liver to contain some fat. If fat is more than 5 to 10% of your liver's weight, you have fatty liver.  There are often no symptoms (problems) for years while damage is still occurring. People often learn about their fatty liver when they have medical tests for other reasons. Fat can damage your liver for years or even decades without causing problems. When it becomes severe, it can cause fatigue, weight loss, weakness, and confusion. This makes you more likely to develop more serious liver problems. The liver is the largest organ in the body. It does a lot of work and often gives no warning signs when it is sick until late in a disease. The liver has many important jobs including:  Breaking down foods.  Storing vitamins, iron, and other minerals.  Making proteins.  Making bile for food digestion.  Breaking down many products including medications, alcohol and some poisons. CAUSES  There are a number of different conditions, medications, and poisons that can cause a fatty liver. Eating too many calories causes fat to build up in the liver. Not processing and breaking fats down normally may also cause this. Certain  conditions, such as obesity, diabetes, and high triglycerides also cause this. Most fatty liver patients tend to be middle-aged and over weight.  Some causes of fatty liver are:  Alcohol over consumption.  Malnutrition.  Steroid use.  Valproic acid toxicity.  Obesity.  Cushing's syndrome.  Poisons.  Tetracycline in high dosages.  Pregnancy.  Diabetes.  Hyperlipidemia.  Rapid weight loss. Some people develop fatty liver even having none of these conditions. SYMPTOMS  Fatty liver most often causes no problems. This is called asymptomatic.  It can be diagnosed with blood tests and also by a liver biopsy.  It is one of the most common causes of minor elevations of liver enzymes on routine blood tests.  Specialized Imaging of the liver using ultrasound, CT (computed tomography) scan, or MRI (magnetic resonance imaging) can suggest a fatty liver but a biopsy is needed to confirm it.  A biopsy involves taking a small sample of liver tissue. This is done by using a needle. It is then looked at under a microscope by a specialist. TREATMENT  It is important to treat the cause. Simple fatty liver without a medical reason may not need treatment.  Weight loss, fat restriction, and exercise in overweight patients produces inconsistent results but is worth trying.  Fatty liver due to alcohol toxicity may not improve even with stopping drinking.  Good control of diabetes may reduce fatty liver.  Lower your triglycerides through diet, medication or both.  Eat a balanced, healthy diet.  Increase  your physical activity.  Get regular checkups from a liver specialist.  There are no medical or surgical treatments for a fatty liver or NASH, but improving your diet and increasing your exercise may help prevent or reverse some of the damage. PROGNOSIS  Fatty liver may cause no damage or it can lead to an inflammation of the liver. This is, called steatohepatitis. When it is linked to  alcohol abuse, it is called alcoholic steatohepatitis. It often is not linked to alcohol. It is then called nonalcoholic steatohepatitis, or NASH. Over time the liver may become scarred and hardened. This condition is called cirrhosis. Cirrhosis is serious and may lead to liver failure or cancer. NASH is one of the leading causes of cirrhosis. About 10-20% of Americans have fatty liver and a smaller 2-5% has NASH. Document Released: 11/11/2005 Document Revised: 12/19/2011 Document Reviewed: 02/05/2014 [facility] Patient Information 2015 [place], [place]. This information is not intended to replace advice given to you by your health care provider. Make sure you discuss any questions you have with your health care provider.

## 2014-11-12 NOTE — Assessment & Plan Note (Signed)
No obvious cirrhosis based on ultrasound and labs. I advised patient he needs to lose weight/exercise to prevent progression to cirrhosis and ultimately potentially cancer.

## 2014-11-12 NOTE — Assessment & Plan Note (Signed)
For chronic back pian, I refilled percocet for 3 months. I reviewed pain contract and discussed that early requests in future or escalating needs would be grounds to terminate all future rx from this office for narcotics. Would still consider pain management referral.

## 2014-11-26 ENCOUNTER — Ambulatory Visit: Payer: Commercial Managed Care - HMO | Admitting: Family Medicine

## 2014-11-27 DIAGNOSIS — G8929 Other chronic pain: Secondary | ICD-10-CM | POA: Diagnosis not present

## 2014-11-27 DIAGNOSIS — Z48810 Encounter for surgical aftercare following surgery on the sense organs: Secondary | ICD-10-CM | POA: Diagnosis not present

## 2014-11-27 DIAGNOSIS — Z87891 Personal history of nicotine dependence: Secondary | ICD-10-CM | POA: Diagnosis not present

## 2014-11-27 DIAGNOSIS — I10 Essential (primary) hypertension: Secondary | ICD-10-CM | POA: Diagnosis not present

## 2014-11-27 DIAGNOSIS — Z8659 Personal history of other mental and behavioral disorders: Secondary | ICD-10-CM | POA: Diagnosis not present

## 2014-11-27 DIAGNOSIS — Z7982 Long term (current) use of aspirin: Secondary | ICD-10-CM | POA: Diagnosis not present

## 2014-11-27 DIAGNOSIS — H2512 Age-related nuclear cataract, left eye: Secondary | ICD-10-CM | POA: Diagnosis not present

## 2014-11-27 DIAGNOSIS — E119 Type 2 diabetes mellitus without complications: Secondary | ICD-10-CM | POA: Diagnosis not present

## 2014-12-02 DIAGNOSIS — Z947 Corneal transplant status: Secondary | ICD-10-CM | POA: Diagnosis not present

## 2014-12-02 DIAGNOSIS — I10 Essential (primary) hypertension: Secondary | ICD-10-CM | POA: Diagnosis not present

## 2014-12-02 DIAGNOSIS — H5703 Miosis: Secondary | ICD-10-CM | POA: Diagnosis not present

## 2014-12-02 DIAGNOSIS — H2512 Age-related nuclear cataract, left eye: Secondary | ICD-10-CM | POA: Diagnosis not present

## 2014-12-02 DIAGNOSIS — E119 Type 2 diabetes mellitus without complications: Secondary | ICD-10-CM | POA: Diagnosis not present

## 2014-12-02 DIAGNOSIS — J449 Chronic obstructive pulmonary disease, unspecified: Secondary | ICD-10-CM | POA: Diagnosis not present

## 2014-12-02 DIAGNOSIS — H2511 Age-related nuclear cataract, right eye: Secondary | ICD-10-CM | POA: Diagnosis not present

## 2014-12-02 DIAGNOSIS — F329 Major depressive disorder, single episode, unspecified: Secondary | ICD-10-CM | POA: Diagnosis not present

## 2014-12-02 DIAGNOSIS — K219 Gastro-esophageal reflux disease without esophagitis: Secondary | ICD-10-CM | POA: Diagnosis not present

## 2014-12-03 DIAGNOSIS — Z48810 Encounter for surgical aftercare following surgery on the sense organs: Secondary | ICD-10-CM | POA: Diagnosis not present

## 2014-12-03 DIAGNOSIS — Z9842 Cataract extraction status, left eye: Secondary | ICD-10-CM | POA: Diagnosis not present

## 2014-12-12 ENCOUNTER — Other Ambulatory Visit: Payer: Self-pay | Admitting: *Deleted

## 2014-12-12 DIAGNOSIS — N400 Enlarged prostate without lower urinary tract symptoms: Secondary | ICD-10-CM

## 2014-12-12 MED ORDER — TAMSULOSIN HCL 0.4 MG PO CAPS: 0.4000 mg | ORAL_CAPSULE | Freq: Every day | ORAL | Status: DC

## 2014-12-12 MED ORDER — GABAPENTIN 300 MG PO CAPS: 300.0000 mg | ORAL_CAPSULE | Freq: Two times a day (BID) | ORAL | Status: DC

## 2014-12-17 ENCOUNTER — Telehealth: Payer: Self-pay

## 2014-12-17 NOTE — Telephone Encounter (Signed)
Thanks for asking. Don't fill naproxen given his kidney function.  Ok to fill famotidine-in his med history.

## 2014-12-17 NOTE — Telephone Encounter (Signed)
Refill request for Famotidine 20mg  and Naproxen 500mg , BUT I dont see these on pt med list. Ok to refill?

## 2014-12-18 MED ORDER — FAMOTIDINE 20 MG PO TABS: 20.0000 mg | ORAL_TABLET | Freq: Every evening | ORAL | Status: DC | PRN

## 2014-12-18 NOTE — Telephone Encounter (Signed)
Famotidine refilled.

## 2015-01-13 ENCOUNTER — Telehealth: Payer: Self-pay | Admitting: Family Medicine

## 2015-01-13 NOTE — Telephone Encounter (Signed)
Not sure why he needs naproxen. He told me this was not working previously. Please check with him to see why he is requesting this.

## 2015-01-13 NOTE — Telephone Encounter (Signed)
Pt called to ask for rx on NAPROXEN     PHARMACY ; [facility] mail [facility]

## 2015-01-14 NOTE — Telephone Encounter (Signed)
Left message for pt to call back  °

## 2015-01-15 NOTE — Telephone Encounter (Signed)
Pt called back and said he noticed you hadn't refilled the Naprosyn and he dosen't know if he really needs to be on it or not. He says he has a visit coming up with you next month and he will discuss it with you then.

## 2015-01-19 ENCOUNTER — Other Ambulatory Visit (INDEPENDENT_AMBULATORY_CARE_PROVIDER_SITE_OTHER): Payer: Commercial Managed Care - HMO

## 2015-01-19 DIAGNOSIS — E1165 Type 2 diabetes mellitus with hyperglycemia: Secondary | ICD-10-CM | POA: Diagnosis not present

## 2015-01-19 DIAGNOSIS — IMO0002 Reserved for concepts with insufficient information to code with codable children: Secondary | ICD-10-CM

## 2015-01-19 LAB — BASIC METABOLIC PANEL
BUN: 19 mg/dL (ref 6–23)
CHLORIDE: 103 meq/L (ref 96–112)
CO2: 31 meq/L (ref 19–32)
CREATININE: 1.58 mg/dL — AB (ref 0.40–1.50)
Calcium: 9 mg/dL (ref 8.4–10.5)
GFR: 45.8 mL/min — ABNORMAL LOW (ref >60.00)
Glucose, Bld: 186 mg/dL — ABNORMAL HIGH (ref 70–99)
POTASSIUM: 4.8 meq/L (ref 3.5–5.1)
Sodium: 138 mEq/L (ref 135–145)

## 2015-01-22 ENCOUNTER — Other Ambulatory Visit: Payer: Self-pay

## 2015-01-22 MED ORDER — TRUEPLUS LANCETS 28G MISC: Status: DC

## 2015-01-22 NOTE — Telephone Encounter (Signed)
Rx request from [place] for Trueplus 28 G Lancets-Test twice daily #200  Rx sent to pharmacy.

## 2015-01-26 ENCOUNTER — Telehealth: Payer: Self-pay | Admitting: Family Medicine

## 2015-01-26 DIAGNOSIS — G4700 Insomnia, unspecified: Secondary | ICD-10-CM

## 2015-01-26 NOTE — Telephone Encounter (Signed)
Pt called to say that he need to go back on the following medicine  NAPROXEN  [place]

## 2015-01-26 NOTE — Telephone Encounter (Signed)
Pt is taking this medicine for joint pain, he cant wait until the 25th to discuss this medicine with you as originally planned. Is this ok to send in?

## 2015-01-26 NOTE — Telephone Encounter (Signed)
Short 5 day course ok-but should not take long term due to kidneys

## 2015-01-27 MED ORDER — NAPROXEN 500 MG PO TBEC: 500.0000 mg | DELAYED_RELEASE_TABLET | Freq: Two times a day (BID) | ORAL | Status: DC

## 2015-01-27 NOTE — Telephone Encounter (Signed)
Pt aware.

## 2015-01-27 NOTE — Telephone Encounter (Signed)
Medication refilled #10.

## 2015-02-02 ENCOUNTER — Ambulatory Visit (INDEPENDENT_AMBULATORY_CARE_PROVIDER_SITE_OTHER): Payer: Commercial Managed Care - HMO | Admitting: Family Medicine

## 2015-02-02 ENCOUNTER — Encounter: Payer: Self-pay | Admitting: Family Medicine

## 2015-02-02 VITALS — BP 144/82 | HR 101 | Temp 97.9°F | Wt 203.0 lb

## 2015-02-02 DIAGNOSIS — I10 Essential (primary) hypertension: Secondary | ICD-10-CM

## 2015-02-02 DIAGNOSIS — E119 Type 2 diabetes mellitus without complications: Secondary | ICD-10-CM | POA: Diagnosis not present

## 2015-02-02 DIAGNOSIS — M549 Dorsalgia, unspecified: Secondary | ICD-10-CM | POA: Diagnosis not present

## 2015-02-02 DIAGNOSIS — G8929 Other chronic pain: Secondary | ICD-10-CM | POA: Diagnosis not present

## 2015-02-02 LAB — COMPREHENSIVE METABOLIC PANEL
ALT: 8 U/L (ref 0–53)
AST: 17 U/L (ref 0–37)
Albumin: 3.5 g/dL (ref 3.5–5.2)
Alkaline Phosphatase: 139 U/L — ABNORMAL HIGH (ref 39–117)
BILIRUBIN TOTAL: 0.3 mg/dL (ref 0.2–1.2)
BUN: 22 mg/dL (ref 6–23)
CO2: 29 mEq/L (ref 19–32)
Calcium: 9.1 mg/dL (ref 8.4–10.5)
Chloride: 105 mEq/L (ref 96–112)
Creatinine, Ser: 1.77 mg/dL — ABNORMAL HIGH (ref 0.40–1.50)
GFR: 40.17 mL/min — AB (ref >60.00)
Glucose, Bld: 171 mg/dL — ABNORMAL HIGH (ref 70–99)
Potassium: 5.3 mEq/L — ABNORMAL HIGH (ref 3.5–5.1)
SODIUM: 139 meq/L (ref 135–145)
TOTAL PROTEIN: 7.1 g/dL (ref 6.0–8.3)

## 2015-02-02 LAB — HEMOGLOBIN A1C: Hgb A1c MFr Bld: 6.4 % (ref 4.6–6.5)

## 2015-02-02 MED ORDER — OXYCODONE-ACETAMINOPHEN 7.5-325 MG PO TABS: 1.0000 | ORAL_TABLET | Freq: Four times a day (QID) | ORAL | Status: DC | PRN

## 2015-02-02 NOTE — Progress Notes (Signed)
[name], MD Phone: [phone]  Subjective:   [name] is a 74 y.o. year old very pleasant male patient who presents with the following:  See problem oriented charting ROS- no leg weakness, fecal or urinary inctontinence  Past Medical History- Patient Active Problem List   Diagnosis Date Noted  . ILD (interstitial lung disease) 07/05/2012    Priority: High  . COPD UNSPECIFIED 12/15/2009    Priority: High  . Chronic back pain. Full history 06/12/14. Pain contract signed.  05/02/2007    Priority: High  . Diabetes mellitus type II, controlled 04/06/2007    Priority: High  . Fatty liver 11/04/2014    Priority: Medium  . BPH (benign prostatic hyperplasia) 06/20/2007    Priority: Medium  . Depression 05/02/2007    Priority: Medium  . Hyperlipidemia 04/06/2007    Priority: Medium  . Essential hypertension 04/06/2007    Priority: Medium  . DEGENERATIVE JOINT DISEASE 04/06/2007    Priority: Medium  . Candidal balanoposthitis 06/20/2014    Priority: Low   Medications- reviewed and updated Current Outpatient Prescriptions  Medication Sig Dispense Refill  . amitriptyline (ELAVIL) 50 MG tablet Take 1 tablet (50 mg total) by mouth at bedtime. 90 tablet 3  . amLODipine (NORVASC) 5 MG tablet Take 1 tablet (5 mg total) by mouth daily. 90 tablet 3  . aspirin 81 MG tablet Take 81 mg by mouth daily.      . Cholecalciferol 1000 UNITS capsule Take 1 capsule (1,000 Units total) by mouth 2 (two) times daily.    . cyanocobalamin (,VITAMIN B-12,) 1000 MCG/ML injection Inject 1 mL (1,000 mcg total) into the muscle once. 1 mL 0  . famotidine (PEPCID) 20 MG tablet Take 1 tablet (20 mg total) by mouth at bedtime as needed for heartburn. One at bedtime 90 tablet 1  . finasteride (PROSCAR) 5 MG tablet Take 1 tablet (5 mg total) by mouth daily. 90 tablet 3  . FLUoxetine (PROZAC) 20 MG tablet Take 1 tablet (20 mg total) by mouth every morning. 90 tablet 3  . gabapentin (NEURONTIN) 300 MG  capsule Take 1 capsule (300 mg total) by mouth 2 (two) times daily. 180 capsule 3  . glyBURIDE-metformin (GLUCOVANCE) 2.5-500 MG per tablet Take 1 tablet by mouth daily with breakfast. 90 tablet 3  . lisinopril (PRINIVIL,ZESTRIL) 10 MG tablet Take 1 tablet (10 mg total) by mouth daily. 90 tablet 3  . Multiple Vitamin (MULTIVITAMIN) capsule Take 1 capsule by mouth daily.      . naproxen (EC-NAPROSYN) 500 MG EC tablet Take 1 tablet (500 mg total) by mouth 2 (two) times daily with a meal. 10 tablet 0  . omeprazole (PRILOSEC) 20 MG capsule Take one 30-60 min before first meal of the day 90 capsule 3  . tamsulosin (FLOMAX) 0.4 MG CAPS capsule Take 1 capsule (0.4 mg total) by mouth daily. 90 capsule 3  . TRUEPLUS LANCETS 28G MISC TEST TWO TIMES DAILY   DX CODE E11.9 200 each 3  . valACYclovir (VALTREX) 500 MG tablet Take 1 tablet (500 mg total) by mouth 2 (two) times daily. 60 tablet 11  . oxyCODONE-acetaminophen (PERCOCET) 7.5-325 MG per tablet Take 1 tablet by mouth every 6 (six) hours as needed for pain (3x day max. May fill 01/11/15.). (Patient not taking: Reported on 02/02/2015) 90 tablet 0   No current facility-administered medications for this visit.    Objective: BP 144/82 mmHg  Pulse 101  Temp(Src) 97.9 F (36.6 C)  Wt 203  lb (92.08 kg) Gen: NAD, resting comfortably CV: RRR no murmurs rubs or gallops Lungs: CTAB no crackles, wheeze, rhonchi Abdomen: soft/nontender/nondistended/normal bowel sounds. No rebound or guarding. obese Ext: no edema Skin: warm, dry, no rash Neuro: grossly normal, moves all extremities   Assessment/Plan:  Chronic back pain. Full history 06/12/14. Pain contract signed.  S: low back pain stable on TID percocet. Allows him to function and get out in his shop. Increases quality of life A/P: refilled percocet x 3 months of 7.5mg . If any increase in need, would refer to pain management.     Diabetes mellitus type II, controlled S: compliant with  Glyburide-metformin 2.5-500. Has lost 15 lbs. He had been urinating a lot which resolved on naproxen (odd report). He has cut back on his foot portion size to "human size" per his report. He is not walking much as planned due to knee and back pain.  A/P: check a1c today, hopeful polyuria was not related to poorly controlled DM. IF a1c >7 increase glyburide-metformin 2.5-500 to BID    Essential hypertension S: continues to have mild poor control and never picked up lisinopril. Only on amlodipine. Frequent urination reported when off of naproxen and resolved on meds-odd reaction.  A/P: discussed he needs to start this but wanted to update renal function first which will be completed today. Hold off on nsaid as well as lisinopril until kidney function evaluated, may need close follow up if continuing to worsen. The report of polyuria improved with naproxen is odd and will hold off on naproxen (wonder if patient confusing names of meds) and have patient follow up if creatinine not improved.     3 month follow up at latest. Urology visit 02/06/15 for balanitis.   Orders Placed This Encounter  Procedures  . Comprehensive metabolic panel    [place]  . Hemoglobin A1c    [place]    Meds ordered this encounter  Medications  .  oxyCODONE-acetaminophen (PERCOCET) 7.5-325 MG per tablet    Sig: Take 1 tablet by mouth every 6 (six) hours as needed (3x a day max. May fill 02/10/15.).    Dispense:  90 tablet    Refill:  0  .  oxyCODONE-acetaminophen (PERCOCET) 7.5-325 MG per tablet    Sig: Take 1 tablet by mouth every 6 (six) hours as needed (3x a day max. May fill 03/13/15.).    Dispense:  90 tablet    Refill:  0  . oxyCODONE-acetaminophen (PERCOCET) 7.5-325 MG per tablet    Sig: Take 1 tablet by mouth every 6 (six) hours as needed (3x a day max. May fill 04/12/15.).    Dispense:  90 tablet    Refill:  0

## 2015-02-02 NOTE — Assessment & Plan Note (Signed)
S: continues to have mild poor control and never picked up lisinopril. Only on amlodipine. Frequent urination reported when off of naproxen and resolved on meds-odd reaction.  A/P: discussed he needs to start this but wanted to update renal function first which will be completed today. Hold off on nsaid as well as lisinopril until kidney function evaluated, may need close follow up if continuing to worsen. The report of polyuria improved with naproxen is odd and will hold off on naproxen (wonder if patient confusing names of meds) and have patient follow up if creatinine not improved.

## 2015-02-02 NOTE — Assessment & Plan Note (Signed)
S: low back pain stable on TID percocet. Allows him to function and get out in his shop. Increases quality of life A/P: refilled percocet x 3 months of 7.5mg . If any increase in need, would refer to pain management.

## 2015-02-02 NOTE — Patient Instructions (Addendum)
Let's check your kidney function today before refilling the naproxen.   I also want to hold off on ordering the lisinopril until we see your kidney function.   Depending on your kidney levels, may need to have you back in this week.   The frequent urination improved on naproxen is not a typical reaction so I would want you to bring all your current medicines to next visit.   Refilled pain medicine in 3 months  If kidney function ok, 3 month follow up at the latest where we could refill pain medicine and check in on a1c

## 2015-02-02 NOTE — Assessment & Plan Note (Signed)
S: compliant with Glyburide-metformin 2.5-500. Has lost 15 lbs. He had been urinating a lot which resolved on naproxen (odd report). He has cut back on his foot portion size to "human size" per his report. He is not walking much as planned due to knee and back pain.  A/P: check a1c today, hopeful polyuria was not related to poorly controlled DM. IF a1c >7 increase glyburide-metformin 2.5-500 to BID

## 2015-02-06 DIAGNOSIS — N401 Enlarged prostate with lower urinary tract symptoms: Secondary | ICD-10-CM | POA: Diagnosis not present

## 2015-02-06 DIAGNOSIS — R972 Elevated prostate specific antigen [PSA]: Secondary | ICD-10-CM | POA: Diagnosis not present

## 2015-02-06 DIAGNOSIS — R3916 Straining to void: Secondary | ICD-10-CM | POA: Diagnosis not present

## 2015-02-06 DIAGNOSIS — N359 Urethral stricture, unspecified: Secondary | ICD-10-CM | POA: Diagnosis not present

## 2015-02-12 DIAGNOSIS — N359 Urethral stricture, unspecified: Secondary | ICD-10-CM | POA: Diagnosis not present

## 2015-02-12 DIAGNOSIS — R3916 Straining to void: Secondary | ICD-10-CM | POA: Diagnosis not present

## 2015-02-12 DIAGNOSIS — N390 Urinary tract infection, site not specified: Secondary | ICD-10-CM | POA: Diagnosis not present

## 2015-02-12 DIAGNOSIS — N401 Enlarged prostate with lower urinary tract symptoms: Secondary | ICD-10-CM | POA: Diagnosis not present

## 2015-03-20 DIAGNOSIS — N402 Nodular prostate without lower urinary tract symptoms: Secondary | ICD-10-CM | POA: Diagnosis not present

## 2015-03-20 DIAGNOSIS — N359 Urethral stricture, unspecified: Secondary | ICD-10-CM | POA: Diagnosis not present

## 2015-03-20 DIAGNOSIS — R3916 Straining to void: Secondary | ICD-10-CM | POA: Diagnosis not present

## 2015-03-20 DIAGNOSIS — N401 Enlarged prostate with lower urinary tract symptoms: Secondary | ICD-10-CM | POA: Diagnosis not present

## 2015-04-15 DIAGNOSIS — H52203 Unspecified astigmatism, bilateral: Secondary | ICD-10-CM | POA: Diagnosis not present

## 2015-05-04 ENCOUNTER — Ambulatory Visit: Payer: Commercial Managed Care - HMO | Admitting: Family Medicine

## 2015-05-04 DIAGNOSIS — Z961 Presence of intraocular lens: Secondary | ICD-10-CM | POA: Diagnosis not present

## 2015-05-04 DIAGNOSIS — Z947 Corneal transplant status: Secondary | ICD-10-CM | POA: Diagnosis not present

## 2015-05-05 ENCOUNTER — Other Ambulatory Visit: Payer: Self-pay | Admitting: Family Medicine

## 2015-05-06 ENCOUNTER — Encounter: Payer: Self-pay | Admitting: Family Medicine

## 2015-05-06 ENCOUNTER — Ambulatory Visit (INDEPENDENT_AMBULATORY_CARE_PROVIDER_SITE_OTHER): Payer: Commercial Managed Care - HMO | Admitting: Family Medicine

## 2015-05-06 VITALS — BP 132/74 | HR 100 | Temp 98.0°F | Wt 198.0 lb

## 2015-05-06 DIAGNOSIS — I10 Essential (primary) hypertension: Secondary | ICD-10-CM | POA: Diagnosis not present

## 2015-05-06 DIAGNOSIS — M549 Dorsalgia, unspecified: Secondary | ICD-10-CM | POA: Diagnosis not present

## 2015-05-06 DIAGNOSIS — N1832 Chronic kidney disease, stage 3b: Secondary | ICD-10-CM | POA: Insufficient documentation

## 2015-05-06 DIAGNOSIS — N183 Chronic kidney disease, stage 3 unspecified: Secondary | ICD-10-CM

## 2015-05-06 DIAGNOSIS — E875 Hyperkalemia: Secondary | ICD-10-CM

## 2015-05-06 DIAGNOSIS — E119 Type 2 diabetes mellitus without complications: Secondary | ICD-10-CM | POA: Diagnosis not present

## 2015-05-06 DIAGNOSIS — F1190 Opioid use, unspecified, uncomplicated: Secondary | ICD-10-CM | POA: Diagnosis not present

## 2015-05-06 DIAGNOSIS — G8929 Other chronic pain: Secondary | ICD-10-CM

## 2015-05-06 LAB — POCT URINALYSIS DIPSTICK
Glucose, UA: NEGATIVE
KETONES UA: NEGATIVE
Nitrite, UA: NEGATIVE
PH UA: 5
Spec Grav, UA: 1.015
UROBILINOGEN UA: 0.2

## 2015-05-06 MED ORDER — OXYCODONE-ACETAMINOPHEN 7.5-325 MG PO TABS: 1.0000 | ORAL_TABLET | Freq: Four times a day (QID) | ORAL | Status: DC | PRN

## 2015-05-06 NOTE — Patient Instructions (Signed)
Refilled pain medicine x 3 months  Check labs today including urine given kidney issues  Also check urine drug screen since already checking urine as part of pain management contract  Blood pressure looks great. Hopeful a1c looks well.

## 2015-05-06 NOTE — Assessment & Plan Note (Signed)
S: improved control on lisinopril (recent addition) with amlodipine A/P: continue lisinopril and amlodipine.

## 2015-05-06 NOTE — Assessment & Plan Note (Signed)
S: stopped naproxen. Started on lisinopril for BP control and hopeful renal protection with DM A/P: check creatinine today, hopeful steady. Hopeful <30% increase in BMET with lisinopril started while naproxen stopped. Repeat planned for 3 months as well.

## 2015-05-06 NOTE — Assessment & Plan Note (Signed)
S: low back pain largely stable on TID percocet. Did have flare last week worse than normal but has returned to baseline. For most part, still allowing patient to function and get out in shop. Activity level is helping with weight loss as well as with effort to cut portion size.  A/P: refilled percocet x 3 months of 7.5mg . UDS completed today as part of pain contract.

## 2015-05-06 NOTE — Progress Notes (Signed)
[name], MD  Subjective:  [name] is a 74 y.o. year old very pleasant male patient who presents with:  See problem oriented charting ROS- no chest pain or shortness of breath. No hypoglycemia or blurry vision. No changes to urination habits.   Past Medical History- potential interstitial lung disease, COPD, BPH, DJD, depression, HTN, fatty liver, HLD  Medications- reviewed and updated Current Outpatient Prescriptions  Medication Sig Dispense Refill  . amitriptyline (ELAVIL) 50 MG tablet Take 1 tablet (50 mg total) by mouth at bedtime. 90 tablet 3  . amLODipine (NORVASC) 5 MG tablet Take 1 tablet (5 mg total) by mouth daily. 90 tablet 3  . aspirin 81 MG tablet Take 81 mg by mouth daily.      . Cholecalciferol 1000 UNITS capsule Take 1 capsule (1,000 Units total) by mouth 2 (two) times daily.    . cyanocobalamin (,VITAMIN B-12,) 1000 MCG/ML injection Inject 1 mL (1,000 mcg total) into the muscle once. 1 mL 0  . famotidine (PEPCID) 20 MG tablet TAKE 1 TABLET (20 MG TOTAL) BY MOUTH AT BEDTIME AS NEEDED FOR HEARTBURN 90 tablet 2  . finasteride (PROSCAR) 5 MG tablet Take 1 tablet (5 mg total) by mouth daily. 90 tablet 3  . FLUoxetine (PROZAC) 20 MG tablet Take 1 tablet (20 mg total) by mouth every morning. 90 tablet 3  . gabapentin (NEURONTIN) 300 MG capsule Take 1 capsule (300 mg total) by mouth 2 (two) times daily. 180 capsule 3  . glyBURIDE-metformin (GLUCOVANCE) 2.5-500 MG per tablet Take 1 tablet by mouth daily with breakfast. 90 tablet 3  . lisinopril (PRINIVIL,ZESTRIL) 10 MG tablet Take 1 tablet (10 mg total) by mouth daily. 90 tablet 3  . Multiple Vitamin (MULTIVITAMIN) capsule Take 1 capsule by mouth daily.      . naproxen (EC-NAPROSYN) 500 MG EC tablet Take 1 tablet (500 mg total) by mouth 2 (two) times daily with a meal. 10 tablet 0  . omeprazole (PRILOSEC) 20 MG capsule Take one 30-60 min before first meal of the day 90 capsule 3  . tamsulosin (FLOMAX) 0.4 MG CAPS  capsule Take 1 capsule (0.4 mg total) by mouth daily. 90 capsule 3  . TRUEPLUS LANCETS 28G MISC TEST TWO TIMES DAILY   DX CODE E11.9 200 each 3  . valACYclovir (VALTREX) 500 MG tablet Take 1 tablet (500 mg total) by mouth 2 (two) times daily. 60 tablet 11  . oxyCODONE-acetaminophen (PERCOCET) 7.5-325 MG per tablet Take 1 tablet by mouth every 6 (six) hours as needed (3x a day max. May fill 04/12/15.). (Patient not taking: Reported on 05/06/2015) 90 tablet 0   Objective: BP 132/74 mmHg  Pulse 100  Temp(Src) 98 F (36.7 C)  Wt 198 lb (89.812 kg) Gen: NAD, resting comfortably CV: RRR no murmurs rubs or gallops Lungs: CTAB no crackles, wheeze, rhonchi Abdomen: soft/nontender/nondistended/normal bowel sounds.   Rises slowly, appears to be in discomfort from back Ext: no edema Skin: warm, dry, no rash Neuro: grossly normal, moves all extremities   Assessment/Plan:  Chronic back pain. Full history 06/12/14. Pain contract signed.  S: low back pain largely stable on TID percocet. Did have flare last week worse than normal but has returned to baseline. For most part, still allowing patient to function and get out in shop. Activity level is helping with weight loss as well as with effort to cut portion size.  A/P: refilled percocet x 3 months of 7.5mg . UDS completed today as part of pain  contract.   Diabetes mellitus type II, controlled S: compliant with Glyburide-metformin 2.5-500. Has lost another 5 lbs due to continued cutting down on portion size and junk food. Some activity but no regular walking due to back pain. Controlled previously. Checks around lunch and CBG 150. Lab Results  Component Value Date   HGBA1C 6.4 02/02/2015  A/P: check a1c today, continue current med- increase to BID if a1c >7.   Essential hypertension S: improved control on lisinopril (recent addition) with amlodipine A/P: continue lisinopril and amlodipine.    CKD (chronic kidney disease), stage III S: stopped  naproxen. Started on lisinopril for BP control and hopeful renal protection with DM A/P: check creatinine today, hopeful steady. Hopeful <30% increase in BMET with lisinopril started while naproxen stopped. Repeat planned for 3 months as well.     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 He did have some LUTS which improved on cipro and plan for repeat urine culture as did not complete antibiotics. We will send for culture as well as micro.   Return precautions advised.   Orders Placed This Encounter  Procedures  . Urine culture  . Hemoglobin A1c  . Basic Metabolic Panel  . Drug Screen, Urine  . Urine Microscopic  . POCT urinalysis dipstick    Meds ordered this encounter  Medications  .  oxyCODONE-acetaminophen (PERCOCET) 7.5-325 MG per tablet    Sig: Take 1 tablet by mouth every 6 (six) hours as needed (3x a day max. May fill 05/13/15.).    Dispense:  90 tablet    Refill:  0  . : oxyCODONE-acetaminophen (PERCOCET) 7.5-325 MG per tablet    Sig: Take 1 tablet by mouth every 6 (six) hours as needed (3x a day max. May fill 06/13/15.).    Dispense:  90 tablet    Refill:  0  . oxyCODONE-acetaminophen (PERCOCET) 7.5-325 MG per tablet    Sig: Take 1 tablet by mouth every 6 (six) hours as needed (3x a day max. May fill 07/13/15.).    Dispense:  90 tablet    Refill:  0

## 2015-05-06 NOTE — Assessment & Plan Note (Signed)
S: compliant with Glyburide-metformin 2.5-500. Has lost another 5 lbs due to continued cutting down on portion size and junk food. Some activity but no regular walking due to back pain. Controlled previously. Checks around lunch and CBG 150. Lab Results  Component Value Date   HGBA1C 6.4 02/02/2015  A/P: check a1c today, continue current med- increase to BID if a1c >7.

## 2015-05-07 LAB — URINALYSIS, MICROSCOPIC ONLY

## 2015-05-07 LAB — DRUG SCREEN, URINE
AMPHETAMINE SCRN UR: NEGATIVE
Barbiturate Quant, Ur: NEGATIVE
Benzodiazepines.: NEGATIVE
CREATININE, U: 164.5 mg/dL
Cocaine Metabolites: NEGATIVE
MARIJUANA METABOLITE: NEGATIVE
Methadone: NEGATIVE
Opiates: POSITIVE — AB
PROPOXYPHENE: NEGATIVE
Phencyclidine (PCP): NEGATIVE

## 2015-05-07 LAB — BASIC METABOLIC PANEL
BUN: 16 mg/dL (ref 6–23)
CALCIUM: 9.3 mg/dL (ref 8.4–10.5)
CHLORIDE: 104 meq/L (ref 96–112)
CO2: 33 meq/L — AB (ref 19–32)
Creatinine, Ser: 1.49 mg/dL (ref 0.40–1.50)
GFR: 48.96 mL/min — AB (ref >60.00)
Glucose, Bld: 108 mg/dL — ABNORMAL HIGH (ref 70–99)
POTASSIUM: 5.3 meq/L — AB (ref 3.5–5.1)
Sodium: 141 mEq/L (ref 135–145)

## 2015-05-07 LAB — HEMOGLOBIN A1C: Hgb A1c MFr Bld: 6.9 % — ABNORMAL HIGH (ref 4.6–6.5)

## 2015-05-09 LAB — URINE CULTURE: Colony Count: >=100000

## 2015-05-11 NOTE — Addendum Note (Signed)
Addended by: Clyde Lundborg A on: 05/11/2015 01:12 PM   Modules accepted: Orders

## 2015-06-05 DIAGNOSIS — Z961 Presence of intraocular lens: Secondary | ICD-10-CM | POA: Diagnosis not present

## 2015-06-05 DIAGNOSIS — Z947 Corneal transplant status: Secondary | ICD-10-CM | POA: Diagnosis not present

## 2015-06-05 DIAGNOSIS — H532 Diplopia: Secondary | ICD-10-CM | POA: Insufficient documentation

## 2015-08-06 ENCOUNTER — Ambulatory Visit (INDEPENDENT_AMBULATORY_CARE_PROVIDER_SITE_OTHER): Payer: Commercial Managed Care - HMO | Admitting: Family Medicine

## 2015-08-06 ENCOUNTER — Encounter: Payer: Self-pay | Admitting: Family Medicine

## 2015-08-06 VITALS — BP 150/80 | HR 90 | Temp 98.5°F | Wt 201.0 lb

## 2015-08-06 DIAGNOSIS — G8929 Other chronic pain: Secondary | ICD-10-CM

## 2015-08-06 DIAGNOSIS — E11621 Type 2 diabetes mellitus with foot ulcer: Secondary | ICD-10-CM

## 2015-08-06 DIAGNOSIS — I159 Secondary hypertension, unspecified: Secondary | ICD-10-CM | POA: Diagnosis not present

## 2015-08-06 DIAGNOSIS — M549 Dorsalgia, unspecified: Secondary | ICD-10-CM | POA: Diagnosis not present

## 2015-08-06 DIAGNOSIS — I10 Essential (primary) hypertension: Secondary | ICD-10-CM | POA: Diagnosis not present

## 2015-08-06 DIAGNOSIS — L97519 Non-pressure chronic ulcer of other part of right foot with unspecified severity: Secondary | ICD-10-CM

## 2015-08-06 DIAGNOSIS — E1142 Type 2 diabetes mellitus with diabetic polyneuropathy: Secondary | ICD-10-CM

## 2015-08-06 LAB — BASIC METABOLIC PANEL
BUN: 16 mg/dL (ref 6–23)
CO2: 30 meq/L (ref 19–32)
Calcium: 9.3 mg/dL (ref 8.4–10.5)
Chloride: 104 mEq/L (ref 96–112)
Creatinine, Ser: 1.19 mg/dL (ref 0.40–1.50)
GFR: 63.42 mL/min (ref >60.00)
GLUCOSE: 215 mg/dL — AB (ref 70–99)
POTASSIUM: 4.7 meq/L (ref 3.5–5.1)
SODIUM: 142 meq/L (ref 135–145)

## 2015-08-06 MED ORDER — AMLODIPINE BESYLATE 5 MG PO TABS: 5.0000 mg | ORAL_TABLET | Freq: Every day | ORAL | Status: DC

## 2015-08-06 MED ORDER — OXYCODONE-ACETAMINOPHEN 7.5-325 MG PO TABS: 1.0000 | ORAL_TABLET | Freq: Four times a day (QID) | ORAL | Status: DC | PRN

## 2015-08-06 MED ORDER — AMLODIPINE BESYLATE 10 MG PO TABS: 10.0000 mg | ORAL_TABLET | Freq: Every day | ORAL | Status: DC

## 2015-08-06 NOTE — Patient Instructions (Addendum)
Sign release of information at the front desk for eye doctor records  For any evaluation for dementia I will need a 30 minute visit. [place] for front desk to create this.  Family members welcome to come   Refilled pain medicine for 3 months  Check to make sure potassium not high- bloodwork needed  Blood pressure concerns me- will recheck before you go

## 2015-08-06 NOTE — Progress Notes (Signed)
Joshua Reddish, MD  Subjective:  [name] is a 73 y.o. year old very pleasant male patient who presents for/with See problem oriented charting ROS- no chest pain. Does have some SOB from ILD. Loss of sensation on soles of feet. Ulcer on bottom of R foot. No hypoglycemia  Past Medical History-  Patient Active Problem List   Diagnosis Date Noted  . CKD (chronic kidney disease), stage III 05/06/2015    Priority: High  . ILD (interstitial lung disease) ([place]) 07/05/2012    Priority: High  . COPD UNSPECIFIED 12/15/2009    Priority: High  . Chronic back pain. Full history 06/12/14. Pain contract signed.  05/02/2007    Priority: High  . Diabetes mellitus type II, controlled ([place]) 04/06/2007    Priority: High  . Fatty liver 11/04/2014    Priority: Medium  . BPH (benign prostatic hyperplasia) 06/20/2007    Priority: Medium  . Depression 05/02/2007    Priority: Medium  . Hyperlipidemia 04/06/2007    Priority: Medium  . Essential hypertension 04/06/2007    Priority: Medium  . DEGENERATIVE JOINT DISEASE 04/06/2007    Priority: Medium  . 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 Diabetic ulcer of right foot associated with type 2 diabetes mellitus ([place]) 08/07/2015  . Diabetic polyneuropathy associated with type 2 diabetes mellitus ([place]) 08/07/2015    Medications- reviewed and updated Current Outpatient Prescriptions  Medication Sig Dispense Refill  . amitriptyline (ELAVIL) 50 MG tablet Take 1 tablet (50 mg total) by mouth at bedtime. 90 tablet 3  . amLODipine (NORVASC) 10 MG tablet Take 1 tablet (10 mg total) by mouth daily. 90 tablet 3  . amLODipine (NORVASC) 5 MG tablet Take 1 tablet (5 mg total) by mouth daily. 90 tablet 3  . aspirin 81 MG tablet Take 81 mg by mouth daily.      . Cholecalciferol 1000 UNITS capsule Take 1 capsule (1,000 Units total) by mouth 2 (two) times daily.    . cyanocobalamin (,VITAMIN B-12,) 1000 MCG/ML injection Inject 1 mL (1,000 mcg total)  into the muscle once. 1 mL 0  . famotidine (PEPCID) 20 MG tablet TAKE 1 TABLET (20 MG TOTAL) BY MOUTH AT BEDTIME AS NEEDED FOR HEARTBURN 90 tablet 2  . finasteride (PROSCAR) 5 MG tablet Take 1 tablet (5 mg total) by mouth daily. 90 tablet 3  . FLUoxetine (PROZAC) 20 MG tablet Take 1 tablet (20 mg total) by mouth every morning. 90 tablet 3  . gabapentin (NEURONTIN) 300 MG capsule Take 1 capsule (300 mg total) by mouth 2 (two) times daily. 180 capsule 3  . glyBURIDE-metformin (GLUCOVANCE) 2.5-500 MG per tablet Take 1 tablet by mouth daily with breakfast. 90 tablet 3  . lisinopril (PRINIVIL,ZESTRIL) 10 MG tablet Take 1 tablet (10 mg total) by mouth daily. 90 tablet 3  . Multiple Vitamin (MULTIVITAMIN) capsule Take 1 capsule by mouth daily.      Marland Kitchen omeprazole (PRILOSEC) 20 MG capsule Take one 30-60 min before first meal of the day 90 capsule 3  . tamsulosin (FLOMAX) 0.4 MG CAPS capsule Take 1 capsule (0.4 mg total) by mouth daily. 90 capsule 3  . TRUEPLUS LANCETS 28G MISC TEST TWO TIMES DAILY   DX CODE E11.9 200 each 3  . valACYclovir (VALTREX) 500 MG tablet Take 1 tablet (500 mg total) by mouth 2 (two) times daily. 60 tablet 11  . oxyCODONE-acetaminophen (PERCOCET) 7.5-325 MG tablet Take 1 tablet by mouth every 6 (six) hours as needed (  3x a day max. May fill 10/13/15.). 90 tablet 0   No current facility-administered medications for this visit.    Objective: BP 150/80 mmHg  Pulse 90  Temp(Src) 98.5 F (36.9 C)  Wt 201 lb (91.173 kg) Gen: NAD, resting comfortably CV: RRR no murmurs rubs or gallops Lungs: CTAB no crackles, wheeze, rhonchi Abdomen: soft/nontender/nondistended/normal bowel sounds. No rebound or guarding.  Ext: no edema MSK: tender to palpation Paraspinous muscles low abck Skin: warm, dry Neuro: grossly normal, moves all extremities  Diabetic Foot Exam - Simple   Simple Foot Form  Diabetic Foot exam was performed with the following findings:  Yes 08/07/2015  7:05 AM  Visual  Inspection  See comments:  Yes  Sensation Testing  See comments:  Yes  Pulse Check  Posterior Tibialis and Dorsalis pulse intact bilaterally:  Yes  Comments  2+ PT, 1+ DP Right 3rd toe- superficial ulcer. Base of 1st MTP- superficial ulcer No sensation bottom of feet on monofilament     Assessment/Plan:  Chronic back pain. Full history 06/12/14. Pain contract signed.  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 See MRI 2009. Can do about 3-4 hours activity in his shop and around the house and then in severe pain. Percocet up to 3x a day at 7.5 mg makes this possible A/P: refilled x 3 months.    Essential hypertension S: poorly controlled. Patient states he is taking "whatever I prescribed". Does sound like he is taking amlodipine. Unclear on lisinopril. We had called him a letter to stop lisinopril but no return call on multiple voicemails. Today, potassium is normal so if he is on lisinopril he can continue A/P:Continue current meds, he is to bring all medications with him to visit in 1 month. Also increased amlodipine to 10mg  from 5. Would try to keep lisinopril same at 10mg , perhaps add hctz component if needed   Diabetic ulcer of right foot associated with type 2 diabetes mellitus ([place]) S: Patient states he picked off a large callus on bottom of his foot within last month and continues to intermittently bleed A/P: now healing ulcer noted, but with reduced sensation as well as small ulcer on 3rd toe- believe could benefit from podiatry referral and potential diabetic shoes- referred at this time.    Diabetic polyneuropathy associated with type 2 diabetes mellitus ([place]) S: unclear how long this has been going on but no sensation to soles of feet with monofilament, making ulcers more likely A/P: refer to podiatry, hopeful he gets set up with diabetic shoes and plan for foot maintenance- at risk for ulceration and in long run amputation.   1  month Return precautions advised.   Orders Placed This Encounter  Procedures  . Basic metabolic panel    [place]  . Ambulatory referral to Podiatry    Referral Priority:  Routine    Referral Type:  Consultation    Referral Reason:  Specialty Services Required    Requested Specialty:  Podiatry    Number of Visits Requested:  1    Meds ordered this encounter  Medications  .  oxyCODONE-acetaminophen (PERCOCET) 7.5-325 MG tablet    Sig: Take 1 tablet by mouth every 6 (six) hours as needed (3x a day max. May fill 08/13/15.).    Dispense:  90 tablet    Refill:  0  . oxyCODONE-acetaminophen (PERCOCET) 7.5-325 MG tablet    Sig: Take 1 tablet by mouth every 6 (six) hours as  needed (3x a day max. May fill 09/12/15.).    Dispense:  90 tablet    Refill:  0  . oxyCODONE-acetaminophen (PERCOCET) 7.5-325 MG tablet    Sig: Take 1 tablet by mouth every 6 (six) hours as needed (3x a day max. May fill 10/13/15.).    Dispense:  90 tablet    Refill:  0  . amLODipine (NORVASC) 10 MG tablet    Sig: Take 1 tablet (10 mg total) by mouth daily.    Dispense:  90 tablet    Refill:  3

## 2015-08-07 DIAGNOSIS — L97519 Non-pressure chronic ulcer of other part of right foot with unspecified severity: Secondary | ICD-10-CM

## 2015-08-07 DIAGNOSIS — E11621 Type 2 diabetes mellitus with foot ulcer: Secondary | ICD-10-CM | POA: Insufficient documentation

## 2015-08-07 DIAGNOSIS — E1142 Type 2 diabetes mellitus with diabetic polyneuropathy: Secondary | ICD-10-CM | POA: Insufficient documentation

## 2015-08-07 NOTE — Assessment & Plan Note (Addendum)
S: poorly controlled. Patient states he is taking "whatever I prescribed". Does sound like he is taking amlodipine. Unclear on lisinopril. We had called him a letter to stop lisinopril but no return call on multiple voicemails. Today, potassium is normal so if he is on lisinopril he can continue A/P:Continue current meds, he is to bring all medications with him to visit in 1 month. Also increased amlodipine to 10mg  from 5. Would try to keep lisinopril same at 10mg , perhaps add hctz component if needed

## 2015-08-07 NOTE — Assessment & Plan Note (Signed)
S: unclear how long this has been going on but no sensation to soles of feet with monofilament, making ulcers more likely A/P: refer to podiatry, hopeful he gets set up with diabetic shoes and plan for foot maintenance- at risk for ulceration and in long run amputation.

## 2015-08-07 NOTE — Assessment & Plan Note (Signed)
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 See MRI 2009. Can do about 3-4 hours activity in his shop and around the house and then in severe pain. Percocet up to 3x a day at 7.5 mg makes this possible A/P: refilled x 3 months.

## 2015-08-07 NOTE — Assessment & Plan Note (Signed)
S: Patient states he picked off a large callus on bottom of his foot within last month and continues to intermittently bleed A/P: now healing ulcer noted, but with reduced sensation as well as small ulcer on 3rd toe- believe could benefit from podiatry referral and potential diabetic shoes- referred at this time.

## 2015-08-20 ENCOUNTER — Ambulatory Visit (INDEPENDENT_AMBULATORY_CARE_PROVIDER_SITE_OTHER): Payer: Commercial Managed Care - HMO | Admitting: Family Medicine

## 2015-08-20 ENCOUNTER — Encounter: Payer: Self-pay | Admitting: Family Medicine

## 2015-08-20 VITALS — BP 130/58 | HR 102 | Temp 98.0°F | Wt 201.0 lb

## 2015-08-20 DIAGNOSIS — E538 Deficiency of other specified B group vitamins: Secondary | ICD-10-CM | POA: Diagnosis not present

## 2015-08-20 DIAGNOSIS — F329 Major depressive disorder, single episode, unspecified: Secondary | ICD-10-CM | POA: Diagnosis not present

## 2015-08-20 DIAGNOSIS — I10 Essential (primary) hypertension: Secondary | ICD-10-CM | POA: Diagnosis not present

## 2015-08-20 DIAGNOSIS — B0050 Herpesviral ocular disease, unspecified: Secondary | ICD-10-CM

## 2015-08-20 DIAGNOSIS — F32A Depression, unspecified: Secondary | ICD-10-CM

## 2015-08-20 DIAGNOSIS — B0230 Zoster ocular disease, unspecified: Secondary | ICD-10-CM | POA: Insufficient documentation

## 2015-08-20 DIAGNOSIS — N400 Enlarged prostate without lower urinary tract symptoms: Secondary | ICD-10-CM | POA: Diagnosis not present

## 2015-08-20 MED ORDER — ACYCLOVIR 400 MG PO TABS: 400.0000 mg | ORAL_TABLET | Freq: Two times a day (BID) | ORAL | Status: DC

## 2015-08-20 MED ORDER — FINASTERIDE 5 MG PO TABS: 5.0000 mg | ORAL_TABLET | Freq: Every day | ORAL | Status: DC

## 2015-08-20 NOTE — Assessment & Plan Note (Signed)
S: thought patient was on Prozac 20 mg, amitriptyline 50 mg. In his bag is amitriptyline only. He denies worsening depression. He does think he has prozac at home though not clear why he did not bring it with him.  A/P: continue amitriptyline alone at minimum. Suspect he is on prozac as well though- continue to push each visit for him to bring meds every visit.

## 2015-08-20 NOTE — Progress Notes (Signed)
[name], MD  Subjective:  [name] is a 74 y.o. year old very pleasant male patient who presents for/with See problem oriented charting ROS- No chest pain or shortness of breath. No headache or blurry vision. No lesions in eyes.   Past Medical History-  Patient Active Problem List   Diagnosis Date Noted  . B12 deficiency 08/20/2015    Priority: High  . 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 CKD (chronic kidney disease), stage III 05/06/2015    Priority: High  . ILD (interstitial lung disease) ([place]) 07/05/2012    Priority: High  . COPD UNSPECIFIED 12/15/2009    Priority: High  . Chronic back pain. Full history 06/12/14. Pain contract signed.  05/02/2007    Priority: High  . Diabetes mellitus type II, controlled ([place]) 04/06/2007    Priority: High  . Fatty liver 11/04/2014    Priority: Medium  . BPH (benign prostatic hyperplasia) 06/20/2007    Priority: Medium  . Depression 05/02/2007    Priority: Medium  . Hyperlipidemia 04/06/2007    Priority: Medium  . Essential hypertension 04/06/2007    Priority: Medium  . DEGENERATIVE JOINT DISEASE 04/06/2007    Priority: Medium  . 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 Diabetic ulcer of right foot associated with type 2 diabetes mellitus ([place]) 08/07/2015  . Diabetic polyneuropathy associated with type 2 diabetes mellitus ([place]) 08/07/2015    Medications- reviewed and updated Current Outpatient Prescriptions  Medication Sig Dispense Refill  . amitriptyline (ELAVIL) 50 MG tablet Take 1 tablet (50 mg total) by mouth at bedtime. 90 tablet 3  . amLODipine (NORVASC) 10 MG tablet Take 1 tablet (10 mg total) by mouth daily. 90 tablet 3  . aspirin 81 MG tablet Take 81 mg by mouth daily.      . Cholecalciferol 1000 UNITS capsule Take 1 capsule (1,000 Units total) by mouth 2 (two) times daily.    . cyanocobalamin (,VITAMIN B-12,) 1000 MCG/ML injection Inject 1 mL (1,000 mcg total) into the muscle once. 1 mL 0   . FLUoxetine (PROZAC) 20 MG tablet Take 1 tablet (20 mg total) by mouth every morning. 90 tablet 3  . gabapentin (NEURONTIN) 300 MG capsule Take 1 capsule (300 mg total) by mouth 2 (two) times daily. 180 capsule 3  . Multiple Vitamin (MULTIVITAMIN) capsule Take 1 capsule by mouth daily.      Marland Kitchen omeprazole (PRILOSEC) 20 MG capsule Take one 30-60 min before first meal of the day 90 capsule 3  . tamsulosin (FLOMAX) 0.4 MG CAPS capsule Take 1 capsule (0.4 mg total) by mouth daily. 90 capsule 3  . TRUEPLUS LANCETS 28G MISC TEST TWO TIMES DAILY   DX CODE E11.9 200 each 3  . acyclovir (ZOVIRAX) 400 MG tablet Take 1 tablet (400 mg total) by mouth 2 (two) times daily. 180 tablet 0  . finasteride (PROSCAR) 5 MG tablet Take 1 tablet (5 mg total) by mouth daily. 90 tablet 3  . oxyCODONE-acetaminophen (PERCOCET) 7.5-325 MG tablet Take 1 tablet by mouth every 6 (six) hours as needed (3x a day max. May fill 10/13/15.). (Patient not taking: Reported on 08/20/2015) 90 tablet 0   No current facility-administered medications for this visit.    Objective: BP 130/58 mmHg  Pulse 102  Temp(Src) 98 F (36.7 C)  Wt 201 lb (91.173 kg) Gen: NAD, resting comfortably CV: RRR no murmurs rubs or gallops Lungs: CTAB no crackles, wheeze, rhonchi Abdomen: soft/nontender/nondistended/normal bowel sounds.  No rebound or guarding. obese Ext: no edema Skin: warm, dry Neuro: grossly normal, moves all extremities  Assessment/Plan:  Essential hypertension S: controlled. On Amlodipine 10mg  (increase from 5mg ).Lisinopril 10mg - potassium was high but unclear if patient was taking at time of test. He does not have it with him today and states he had stopped taking it to his knowledge BP Readings from Last 3 Encounters:  08/20/15 130/58  08/06/15 150/80  05/06/15 132/74  A/P:Continue current meds:  Amlodipine alone- if restarted lisinopril in future would need to check potassium again before and after start.    Depression S:  thought patient was on Prozac 20 mg, amitriptyline 50 mg. In his bag is amitriptyline only. He denies worsening depression. He does think he has prozac at home though not clear why he did not bring it with him.  A/P: continue amitriptyline alone at minimum. Suspect he is on prozac as well though- continue to push each visit for him to bring meds every visit.    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 States sees eye doc 2-3 months A/P: I gave 3 month rx of acyclovir but I asked patient to have rx resumed by optho or have them write letter to me with history which is not available at this time in our system.     Return precautions advised.   Was already taking- renewed in rx  Meds ordered this encounter  Medications  . finasteride (PROSCAR) 5 MG tablet    Sig: Take 1 tablet (5 mg total) by mouth daily.    Dispense:  90 tablet    Refill:  3  . acyclovir (ZOVIRAX) 400 MG tablet    Sig: Take 1 tablet (400 mg total) by mouth 2 (two) times daily.    Dispense:  180 tablet    Refill:  0

## 2015-08-20 NOTE — Assessment & Plan Note (Signed)
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 States sees eye doc 2-3 months A/P: I gave 3 month rx of acyclovir but I asked patient to have rx resumed by optho or have them write letter to me with history which is not available at this time in our system.

## 2015-08-20 NOTE — Assessment & Plan Note (Addendum)
S: controlled. On Amlodipine 10mg  (increase from 5mg ).Lisinopril 10mg - potassium was high but unclear if patient was taking at time of test. He does not have it with him today and states he had stopped taking it to his knowledge BP Readings from Last 3 Encounters:  08/20/15 130/58  08/06/15 150/80  05/06/15 132/74  A/P:Continue current meds:  Amlodipine alone- if restarted lisinopril in future would need to check potassium again before and after start.

## 2015-08-20 NOTE — Patient Instructions (Addendum)
Blood pressure is fine on amlodipine 10mg  alone- continue current medicine. Remain off lisinopril since you are not taking that  Refilled acyclovir but you need to see your eye doctor for further prescriptions or have him send me a letter stating how long you should be on medicine, dose, and reason for this

## 2015-09-07 ENCOUNTER — Ambulatory Visit: Payer: Self-pay | Admitting: Podiatry

## 2015-09-28 ENCOUNTER — Other Ambulatory Visit: Payer: Self-pay | Admitting: Family Medicine

## 2015-09-30 ENCOUNTER — Other Ambulatory Visit: Payer: Self-pay | Admitting: Family Medicine

## 2015-10-09 ENCOUNTER — Encounter: Payer: Self-pay | Admitting: Podiatry

## 2015-10-09 ENCOUNTER — Ambulatory Visit (INDEPENDENT_AMBULATORY_CARE_PROVIDER_SITE_OTHER): Payer: Commercial Managed Care - HMO | Admitting: Podiatry

## 2015-10-09 VITALS — BP 119/62 | HR 89 | Resp 18

## 2015-10-09 DIAGNOSIS — E1142 Type 2 diabetes mellitus with diabetic polyneuropathy: Secondary | ICD-10-CM | POA: Diagnosis not present

## 2015-10-09 DIAGNOSIS — L84 Corns and callosities: Secondary | ICD-10-CM | POA: Diagnosis not present

## 2015-10-09 DIAGNOSIS — E1149 Type 2 diabetes mellitus with other diabetic neurological complication: Secondary | ICD-10-CM

## 2015-10-09 DIAGNOSIS — L97511 Non-pressure chronic ulcer of other part of right foot limited to breakdown of skin: Secondary | ICD-10-CM | POA: Diagnosis not present

## 2015-10-09 DIAGNOSIS — L97519 Non-pressure chronic ulcer of other part of right foot with unspecified severity: Secondary | ICD-10-CM | POA: Insufficient documentation

## 2015-10-09 MED ORDER — CEPHALEXIN 500 MG PO CAPS: 500.0000 mg | ORAL_CAPSULE | Freq: Three times a day (TID) | ORAL | Status: DC

## 2015-10-09 NOTE — Progress Notes (Signed)
   Subjective:    Patient ID: [name], male    DOB: 1941/01/17, 74 y.o.   MRN: [id number]  HPI  74 year old male presents the office of concerns of the possible ulcerative ball of his right foot which is been ongoing for approximate one month. He was seen his primary care physician. His last blood sugar is 145. He denies any redness or red streak any drainage or pus. He denies any history of ulceration. He doesn't the wound does bleed at times. No other complaints at this time. No claudication symptoms.  Review of Systems  All other systems reviewed and are negative.      Objective:   Physical Exam General: AAO x3, NAD  Dermatological: Right foot submetatarsal one is a hyperkeratotic lesion. Upon debridement there was a small superficial pinpoint ulceration measuring 0.2 x 0.27 m without any probing or undermining or tunneling. No swelling erythema, edema, drainage or purulence. No conical signs of infection. Pre-ulcerative calluses bilateral second metatarsal 5. There was mild malodor after debridement of the right submetatarsal one wound. Upon debridement  no underlying ulceration, drainage or other signs of infection.  Vascular: Dorsalis Pedis artery and Posterior Tibial artery pedal pulses are 1/4 bilateral with immedate capillary fill time. Pedal hair growth present. No varicosities and no lower extremity edema present bilateral. There is no pain with calf compression, swelling, warmth, erythema.   Neruologic: Protective sensation absent with [name] monofilament or with vibratory sensation.   Musculoskeletal: No gross boney pedal deformities bilateral. No pain, crepitus, or limitation noted with foot and ankle range of motion bilateral. Muscular strength 5/5 in all groups tested bilateral.  Gait: Unassisted, Nonantalgic.         Assessment & Plan:  74 year old male right second metatarsal 1 ulceration and pre-ulcerative lesions -Treatment options discussed  including all alternatives, risks, and complications -Etiology of symptoms were discussed -Will debrided right foot a granular tissue. Continue with daily dressing changes with antibiotic ointment and a dressing. Offloading pads dispensed. Monitor for any clinical signs or symptoms of infection and directed to call the office immediately should any occur or go to the ER. -Pre-ulcerative callus debrided 2 without complication/bleeding -Follow-up in 2-3 weeks or sooner if any problems arise. In the meantime, encouraged to call the office with any questions, concerns, change in symptoms.  *X-ray right foot next appointment. If not healing will order arterial studies.  Celesta Gentile, DPM

## 2015-10-16 ENCOUNTER — Other Ambulatory Visit: Payer: Self-pay | Admitting: Family Medicine

## 2015-10-16 ENCOUNTER — Other Ambulatory Visit: Payer: Self-pay | Admitting: Podiatry

## 2015-10-19 NOTE — Telephone Encounter (Signed)
Pt IS to KEEP appt scheduled for 10/30/2015 with Dr. Jacqualyn Posey

## 2015-10-28 ENCOUNTER — Other Ambulatory Visit: Payer: Self-pay | Admitting: Podiatry

## 2015-10-28 NOTE — Telephone Encounter (Signed)
Refill for Cephalexin held. Pt is to be seen by Dr. Jacqualyn Posey on 10/30/2015.  Left message instructing pt of appt time and date, and evaluation for antibiotic necessity at next office visit.

## 2015-10-30 ENCOUNTER — Ambulatory Visit (INDEPENDENT_AMBULATORY_CARE_PROVIDER_SITE_OTHER): Payer: Commercial Managed Care - HMO | Admitting: Podiatry

## 2015-10-30 ENCOUNTER — Encounter: Payer: Self-pay | Admitting: Podiatry

## 2015-10-30 ENCOUNTER — Ambulatory Visit (INDEPENDENT_AMBULATORY_CARE_PROVIDER_SITE_OTHER): Payer: Commercial Managed Care - HMO

## 2015-10-30 VITALS — BP 160/86 | HR 92 | Resp 12

## 2015-10-30 DIAGNOSIS — L988 Other specified disorders of the skin and subcutaneous tissue: Secondary | ICD-10-CM | POA: Diagnosis not present

## 2015-10-30 DIAGNOSIS — L97511 Non-pressure chronic ulcer of other part of right foot limited to breakdown of skin: Secondary | ICD-10-CM | POA: Diagnosis not present

## 2015-10-30 DIAGNOSIS — R238 Other skin changes: Secondary | ICD-10-CM

## 2015-11-02 NOTE — Progress Notes (Signed)
Patient ID: [name], male   DOB: 1941-01-10, 75 y.o.   MRN: [id number]  Subjective: 75 year old male presents the office today follow-up evaluation of ulceration to the ball of his right foot. He states his left palm he has not noticed any drainage from the area or any pus. As for any redness or red streaks. He has been continuing daily dressing changes with antibiotic ointment without any complications. Denies any systemic complaints such as fevers, chills, nausea, vomiting. No acute changes since last appointment, and no other complaints at this time.   Objective: AAO x3, NAD DP/PT pulses palpable bilaterally, CRT less than 3 seconds Protective sensation absent with [name] monofilament Hyperkeratotic lesion right foot submetatarsal one. Upon debridement there is no underlying ulceration to the area although it is pre-ulcerative. There is no ascending redness or red streaks. No fluctuance or crepitance or malodor or any drainage/purulence. On the first interspace dorsally there is dried area of what appears to be hemorrhagic bulla without any fluctuance or crepitus or any malodor. There is no swelling erythema, ascending cellulitis. No areas of pinpoint bony tenderness or pain with vibratory sensation. MMT 5/5, ROM WNL. No edema, erythema, increase in warmth to bilateral lower extremities.  No open lesions or pre-ulcerative lesions.  No pain with calf compression, swelling, warmth, erythema  Assessment: Healed ulceration submetatarsal 1, dorsal dried bulla  Plan: -All treatment options discussed with the patient including all alternatives, risks, complications.  -Pre-ulcerative lesions debrided without complications or bleeding. Continue offloading for now. Continue to monitor the dorsal dried bulla. -Monitor for any clinical signs or symptoms of infection and directed to call the office immediately should any occur or go to the ER. -Follow-up in 4 weeks or sooner if any problems  arise. In the meantime, encouraged to call the office with any questions, concerns, change in symptoms.   Celesta Gentile, DPM

## 2015-11-11 ENCOUNTER — Other Ambulatory Visit: Payer: Self-pay | Admitting: Family Medicine

## 2015-11-13 ENCOUNTER — Ambulatory Visit (INDEPENDENT_AMBULATORY_CARE_PROVIDER_SITE_OTHER): Payer: Commercial Managed Care - HMO | Admitting: Family Medicine

## 2015-11-13 ENCOUNTER — Encounter: Payer: Self-pay | Admitting: Family Medicine

## 2015-11-13 VITALS — BP 130/62 | HR 98 | Temp 98.1°F | Wt 205.0 lb

## 2015-11-13 DIAGNOSIS — M549 Dorsalgia, unspecified: Secondary | ICD-10-CM | POA: Diagnosis not present

## 2015-11-13 DIAGNOSIS — G8929 Other chronic pain: Secondary | ICD-10-CM | POA: Diagnosis not present

## 2015-11-13 MED ORDER — OXYCODONE-ACETAMINOPHEN 7.5-325 MG PO TABS: 1.0000 | ORAL_TABLET | Freq: Four times a day (QID) | ORAL | Status: DC | PRN

## 2015-11-13 NOTE — Progress Notes (Signed)
Chronic back pain. Full history 06/12/14. Pain contract signed.  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 He feels that it is slightly worse especially on left despite TID perococet 7.5mg . He still is able to be active in his shop and around the home with the medicine when primarily wants to lay in bed or sit in chair if not.   He arrived this morning to office requesting a refill as would run out today. HE had already signed pain contract stating this would only be done during office visits.  A/P: I refilled medicine for 3 months- we advised office visit today. We had a long discussion about his pain contract and fact that refills had to be during office visits and we needed to reevaluate pain management at least every 3 months. Also discussed not escalating dosages despite increase in pain and that increasing dosages would likely lead to increasing need over time for further increases. Explicitly stated needed a visit within 4 months from today as I will give 1 more rx later this month when he comes for physical but will need to see me face to face before June 3rd when he would run out again.   >50% of 20 minute office visit was spent on counseling (need for office visits, discussion of pain contract, avoiding escalating doses) and coordination of care. Patient also to bring all meds to next visit- still not sure if he is taking prozac.

## 2015-11-13 NOTE — Assessment & Plan Note (Addendum)
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 He feels that it is slightly worse especially on left despite TID perococet 7.5mg . He still is able to be active in his shop and around the home with the medicine when primarily wants to lay in bed or sit in chair if not.   He arrived this morning to office requesting a refill as would run out today. HE had already signed pain contract stating this would only be done during office visits.  A/P: I refilled medicine for 3 months- we advised office visit today. We had a long discussion about his pain contract and fact that refills had to be during office visits and we needed to reevaluate pain management at least every 3 months. Also discussed not escalating dosages despite increase in pain and that increasing dosages would likely lead to increasing need over time for further increases. Explicitly stated needed a visit within 4 months from today as I will give 1 more rx later this month when he comes for physical but will need to see me face to face before June 3rd when he would run out again.   >50% of 20 minute office visit was spent on counseling (need for office visits, discussion of pain contract, avoiding escalating doses) and coordination of care. Patient also to bring all meds to next visit- still not sure if he is taking prozac.

## 2015-11-13 NOTE — Patient Instructions (Signed)
You have to have an appointment with me before you run out of pain medicine. We cannot refill just if you stop by, must see me. Need to make sure you always have an appointment scheduled BEFORE you run out- best to do this in advance. When I see you later this month i can give you 1 more refill so you need to schedule an appointment BEFORE June 3rd, 2017.   Bring all medicines to next visit- see if you have prozac at home

## 2015-11-27 ENCOUNTER — Ambulatory Visit (INDEPENDENT_AMBULATORY_CARE_PROVIDER_SITE_OTHER): Payer: Commercial Managed Care - HMO | Admitting: Podiatry

## 2015-11-27 ENCOUNTER — Encounter: Payer: Self-pay | Admitting: Podiatry

## 2015-11-27 VITALS — BP 136/76 | HR 100 | Resp 12

## 2015-11-27 DIAGNOSIS — L84 Corns and callosities: Secondary | ICD-10-CM | POA: Diagnosis not present

## 2015-11-27 DIAGNOSIS — E1149 Type 2 diabetes mellitus with other diabetic neurological complication: Secondary | ICD-10-CM

## 2015-12-01 ENCOUNTER — Other Ambulatory Visit (INDEPENDENT_AMBULATORY_CARE_PROVIDER_SITE_OTHER): Payer: Commercial Managed Care - HMO

## 2015-12-01 DIAGNOSIS — Z0000 Encounter for general adult medical examination without abnormal findings: Secondary | ICD-10-CM | POA: Diagnosis not present

## 2015-12-01 DIAGNOSIS — Z125 Encounter for screening for malignant neoplasm of prostate: Secondary | ICD-10-CM

## 2015-12-01 DIAGNOSIS — E875 Hyperkalemia: Secondary | ICD-10-CM

## 2015-12-01 LAB — BASIC METABOLIC PANEL
BUN: 19 mg/dL (ref 6–23)
CALCIUM: 9.1 mg/dL (ref 8.4–10.5)
CO2: 29 mEq/L (ref 19–32)
CREATININE: 1.37 mg/dL (ref 0.40–1.50)
Chloride: 102 mEq/L (ref 96–112)
GFR: 53.86 mL/min — ABNORMAL LOW (ref >60.00)
Glucose, Bld: 161 mg/dL — ABNORMAL HIGH (ref 70–99)
Potassium: 4.7 mEq/L (ref 3.5–5.1)
Sodium: 137 mEq/L (ref 135–145)

## 2015-12-01 LAB — CBC WITH DIFFERENTIAL/PLATELET
BASOS ABS: 0 10*3/uL (ref 0.0–0.1)
Basophils Relative: 0.6 % (ref 0.0–3.0)
EOS ABS: 0.1 10*3/uL (ref 0.0–0.7)
Eosinophils Relative: 1.2 % (ref 0.0–5.0)
HEMATOCRIT: 33.3 % — AB (ref 39.0–52.0)
HEMOGLOBIN: 11.3 g/dL — AB (ref 13.0–17.0)
LYMPHS PCT: 22.9 % (ref 12.0–46.0)
Lymphs Abs: 1.5 10*3/uL (ref 0.7–4.0)
MCHC: 33.9 g/dL (ref 30.0–36.0)
MCV: 95.2 fl (ref 78.0–100.0)
Monocytes Absolute: 0.4 10*3/uL (ref 0.1–1.0)
Monocytes Relative: 6 % (ref 3.0–12.0)
NEUTROS ABS: 4.5 10*3/uL (ref 1.4–7.7)
Neutrophils Relative %: 69.3 % (ref 43.0–77.0)
PLATELETS: 123 10*3/uL — AB (ref 150.0–400.0)
RBC: 3.5 Mil/uL — ABNORMAL LOW (ref 4.22–5.81)
RDW: 14.3 % (ref 11.5–15.5)
WBC: 6.5 10*3/uL (ref 4.0–10.5)

## 2015-12-01 LAB — MICROALBUMIN / CREATININE URINE RATIO
Creatinine,U: 171.7 mg/dL
MICROALB UR: 7.8 mg/dL — AB (ref 0.0–1.9)
Microalb Creat Ratio: 4.5 mg/g (ref 0.0–30.0)

## 2015-12-01 LAB — LIPID PANEL
Cholesterol: 156 mg/dL (ref 0–200)
HDL: 35.5 mg/dL — ABNORMAL LOW
LDL Cholesterol: 95 mg/dL (ref 0–99)
NonHDL: 120.13
Total CHOL/HDL Ratio: 4
Triglycerides: 124 mg/dL (ref 0.0–149.0)
VLDL: 24.8 mg/dL (ref 0.0–40.0)

## 2015-12-01 LAB — HEPATIC FUNCTION PANEL
ALK PHOS: 103 U/L (ref 39–117)
ALT: 10 U/L (ref 0–53)
AST: 20 U/L (ref 0–37)
Albumin: 3.8 g/dL (ref 3.5–5.2)
BILIRUBIN DIRECT: 0.1 mg/dL (ref 0.0–0.3)
BILIRUBIN TOTAL: 0.6 mg/dL (ref 0.2–1.2)
TOTAL PROTEIN: 6.4 g/dL (ref 6.0–8.3)

## 2015-12-01 LAB — POC URINALSYSI DIPSTICK (AUTOMATED)
BILIRUBIN UA: NEGATIVE
Glucose, UA: NEGATIVE
LEUKOCYTES UA: NEGATIVE
NITRITE UA: NEGATIVE
PH UA: 6
Spec Grav, UA: 1.02
UROBILINOGEN UA: 1

## 2015-12-01 LAB — URINALYSIS, MICROSCOPIC ONLY

## 2015-12-01 LAB — PSA: PSA: 0.11 ng/mL (ref 0.10–4.00)

## 2015-12-01 LAB — HEMOGLOBIN A1C: Hgb A1c MFr Bld: 7.4 % — ABNORMAL HIGH (ref 4.6–6.5)

## 2015-12-01 LAB — TSH: TSH: 2.67 u[IU]/mL (ref 0.35–4.50)

## 2015-12-02 ENCOUNTER — Encounter: Payer: Self-pay | Admitting: Podiatry

## 2015-12-02 NOTE — Progress Notes (Signed)
Patient ID: [name], male   DOB: 01/11/1941, 75 y.o.   MRN: [id number]  Subjective: 75 year old male presents the office today follow-up evaluation of ulceration to the ball of his right foot. He continues to state there has not been any drainage or pus coming from the area and denies any swelling redness or red streaks. He believes the wound is healed he is to dressing the area. Denies any systemic complaints such as fevers, chills, nausea, vomiting. No acute changes since last appointment, and no other complaints at this time.   Objective: AAO x3, NAD DP/PT pulses palpable bilaterally, CRT less than 3 seconds Protective sensation absent with [name] monofilament Hyperkeratotic lesion right foot submetatarsal one. Upon debridement there is no underlying ulceration identified today. There is no ascending redness or surrounding erythema. No fluctuance or crepitance or malodor or any drainage/purulence.  No areas of pinpoint bony tenderness or pain with vibratory sensation. MMT 5/5, ROM WNL. No edema, erythema, increase in warmth to bilateral lower extremities.  No other open lesions or pre-ulcerative lesions.  No pain with calf compression, swelling, warmth, erythema  Assessment: Healed ulceration submetatarsal 1  Plan: -All treatment options discussed with the patient including all alternatives, risks, complications.  -Pre-ulcerative lesions debrided without complications or bleeding. Continue offloading and monitor for any recurrence. -Monitor for any clinical signs or symptoms of infection and directed to call the office immediately should any occur or go to the ER. -Follow-up in 6 months at his request or sooner if any problems arise. In the meantime, encouraged to call the office with any questions, concerns, change in symptoms.   Celesta Gentile, DPM

## 2015-12-08 ENCOUNTER — Encounter: Payer: Commercial Managed Care - HMO | Admitting: Family Medicine

## 2015-12-17 ENCOUNTER — Other Ambulatory Visit: Payer: Self-pay | Admitting: Family Medicine

## 2015-12-21 ENCOUNTER — Ambulatory Visit (INDEPENDENT_AMBULATORY_CARE_PROVIDER_SITE_OTHER): Payer: Commercial Managed Care - HMO | Admitting: Family Medicine

## 2015-12-21 ENCOUNTER — Encounter: Payer: Self-pay | Admitting: Family Medicine

## 2015-12-21 VITALS — BP 132/64 | HR 112 | Temp 97.7°F | Ht 72.0 in | Wt 199.0 lb

## 2015-12-21 DIAGNOSIS — D696 Thrombocytopenia, unspecified: Secondary | ICD-10-CM

## 2015-12-21 DIAGNOSIS — R6889 Other general symptoms and signs: Secondary | ICD-10-CM

## 2015-12-21 DIAGNOSIS — E1142 Type 2 diabetes mellitus with diabetic polyneuropathy: Secondary | ICD-10-CM

## 2015-12-21 DIAGNOSIS — Z0001 Encounter for general adult medical examination with abnormal findings: Secondary | ICD-10-CM

## 2015-12-21 DIAGNOSIS — E785 Hyperlipidemia, unspecified: Secondary | ICD-10-CM

## 2015-12-21 MED ORDER — OXYCODONE-ACETAMINOPHEN 7.5-325 MG PO TABS: 1.0000 | ORAL_TABLET | Freq: Four times a day (QID) | ORAL | Status: DC | PRN

## 2015-12-21 MED ORDER — ROSUVASTATIN CALCIUM 10 MG PO TABS: 10.0000 mg | ORAL_TABLET | Freq: Every day | ORAL | Status: DC

## 2015-12-21 MED ORDER — METFORMIN HCL 500 MG PO TABS: 500.0000 mg | ORAL_TABLET | Freq: Every day | ORAL | Status: DC

## 2015-12-21 NOTE — Patient Instructions (Addendum)
Get eye exam scheduled and fax results to [place] at [phone].  Start metformin 500mg  (somehow your diabetes medicine did not get sent to you recently)  Start crestor 10mg  for cholesterol- if too expensive call us and we will change to atorvastatin 20mg   See me in 3 months. May update some labs then as well

## 2015-12-21 NOTE — Progress Notes (Signed)
Joshua Reddish, MD Phone: [phone]  Subjective:  Patient presents today for their annual physical. Chief complaint-noted.   See problem oriented charting- ROS- full  review of systems was completed and negative except for: does have some shortness of breath with activity, polyuria and weak stream but slightly improved, chronic back pain  The following were reviewed and entered/updated in epic: Past Medical History  Diagnosis Date  . Hyperlipidemia   . Hypertension August 12, 2009    try off ACE for upper airway cough  . Coughing December 18, 2009    Sinus CT : Minor mucosal thickening at the Right Frontoethmoidal recess, otherwise  negative sinuses  . COPD (chronic obstructive pulmonary disease) ([facility]) December 15, 2009    FEV1 2.30 (70%) ratio 63 no better with B2 and DLCO 18/6 (73%) corrects to 106%  . Diabetes mellitus type II   . Degenerative joint disease   . Depression   . Benign prostatic hypertrophy   . Low back pain   . Otitis externa 07/30/2012   Patient Active Problem List   Diagnosis Date Noted  . Thrombocytopenia ([facility]) 12/21/2015    Priority: High  . B12 deficiency 08/20/2015    Priority: High  . 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 CKD (chronic kidney disease), stage III 05/06/2015    Priority: High  . ILD (interstitial lung disease) ([facility]) 07/05/2012    Priority: High  . COPD UNSPECIFIED 12/15/2009    Priority: High  . Chronic back pain. Full history 06/12/14. Pain contract signed.  05/02/2007    Priority: High  . Diabetes mellitus type II, controlled ([facility]) 04/06/2007    Priority: High  . Right foot ulcer ([facility]) 10/09/2015    Priority: Medium  . Diabetic polyneuropathy associated with type 2 diabetes mellitus ([facility]) 08/07/2015    Priority: Medium  . Fatty liver 11/04/2014    Priority: Medium  . BPH (benign prostatic hyperplasia) 06/20/2007    Priority: Medium  . Depression 05/02/2007    Priority: Medium  . Hyperlipidemia 04/06/2007    Priority:  Medium  . Essential hypertension 04/06/2007    Priority: Medium  . DEGENERATIVE JOINT DISEASE 04/06/2007    Priority: Medium  . Candidal balanoposthitis 06/20/2014    Priority: Low   Past Surgical History  Procedure Laterality Date  . Dvt  1980    Family History  Problem Relation Age of Onset  . Cancer Mother     melanoma  . Stroke Father     Medications- reviewed and updated Current Outpatient Prescriptions  Medication Sig Dispense Refill  . acyclovir (ZOVIRAX) 400 MG tablet TAKE 1 TABLET TWICE DAILY 180 tablet 2  . amitriptyline (ELAVIL) 50 MG tablet TAKE 1 TABLET AT BEDTIME 90 tablet 3  . amLODipine (NORVASC) 10 MG tablet Take 1 tablet (10 mg total) by mouth daily. 90 tablet 3  . aspirin 81 MG tablet Take 81 mg by mouth daily.      . Cholecalciferol 1000 UNITS capsule Take 1 capsule (1,000 Units total) by mouth 2 (two) times daily.    . finasteride (PROSCAR) 5 MG tablet TAKE 1 TABLET EVERY DAY 90 tablet 3  . gabapentin (NEURONTIN) 300 MG capsule TAKE 1 CAPSULE TWICE DAILY 180 capsule 1  . Multiple Vitamin (MULTIVITAMIN) capsule Take 1 capsule by mouth daily.      Marland Kitchen omeprazole (PRILOSEC) 20 MG capsule TAKE 1 CAPSULE DAILY 30-60 MINUTES BEFORE FIRST MEAL OF THE DAY 90 capsule 3  . tamsulosin (FLOMAX) 0.4 MG  CAPS capsule TAKE 1 CAPSULE EVERY DAY 90 capsule 1  . TRUEPLUS LANCETS 28G MISC TEST TWO TIMES DAILY   DX CODE E11.9 200 each 3  . oxyCODONE-acetaminophen (PERCOCET) 7.5-325 MG tablet Take 1 tablet by mouth every 6 (six) hours as needed for severe pain (3x a day max. May fill 12/11/15.). (Patient not taking: Reported on 12/21/2015) 90 tablet 0   No current facility-administered medications for this visit.    Allergies-reviewed and updated No Known Allergies  Social History   Social History  . Marital Status: Married    Spouse Name: N/A  . Number of Children: N/A  . Years of Education: N/A   Occupational History  . retired     Armed forces operational officer work   Social History  Main Topics  . Smoking status: Former Smoker -- 4.00 packs/day for 20 years    Types: Cigarettes    Quit date: 09/18/1984  . Smokeless tobacco: Never Used     Comment: quit in 1980  . Alcohol Use: No  . Drug Use: No  . Sexual Activity: Yes   Other Topics Concern  . None   Social History Narrative   Married 53 years in 2015. 4 kids (2 sons) and 3 grandkids.    Lived in [place], [place] wholel life      Retired from [employer], going to [employer] where they have a camper             ROS--See HPI   Objective: BP 132/64 mmHg  Pulse 112  Temp(Src) 97.7 F (36.5 C)  Ht 6' (1.829 m)  Wt 199 lb (90.266 kg)  BMI 26.98 kg/m2 Gen: NAD, resting comfortably HEENT: Mucous membranes are moist. Oropharynx normal- dentures, somewhat dry Neck: no thyromegaly CV: RRR no murmurs rubs or gallops Lungs: CTAB no crackles, wheeze, rhonchi Abdomen: soft/nontender/nondistended/normal bowel sounds. No rebound or guarding. obese  Ext: no edema Skin: warm, dry Neuro: grossly normal, moves all extremities, PERRLA  Rectal: normal tone, normal size prostate on finasteride, no masses or tenderness   Assessment/Plan:  75 y.o. male presenting for annual physical.  Health Maintenance counseling: 1. Anticipatory guidance: Patient counseled regarding regular dental exams- false teeth so doesn't see, eye exams, wearing seatbelts.  2. Risk factor reduction:  Advised patient of need for regular exercise and diet rich and fruits and vegetables to reduce risk of heart attack and stroke.  3. Immunizations/screenings/ancillary studies Health Maintenance Due  Topic Date Due  . OPHTHALMOLOGY EXAM - advised to schedule 06/13/2015   4. Prostate cancer screening- known BPH- PSA down on finasteride.  Lab Results  Component Value Date   PSA 0.11 12/01/2015   PSA 0.48 08/23/2011   PSA 2.15 01/04/2010   5. Colon cancer screening - 06/07/2007 with 10 year repeat planned 6. 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 No change in pain level Hypertension- controlled on amlodipine Depression controlled on prozac 20 and amitripytine   Hyperlipidemia S: LDL >70 in diabetic. LFTs now ok.  A/P:will start crestor 10mg     Diabetes mellitus type II, controlled S:Diabetes-glyburide- metformin 2.5-500 previously- ran out -was unaware-and a1c up.  A/P:Will start back on metformin 500mg   alone.  Repeat a1c on follow up    Thrombocytopenia ([facility]) S: Low on 2 consecutive measures- also has some anemia.  A/P: now with low platelets in addition to chronic anemia. We will need to monitor closely. Will get cbc with diff and peripheral smear at follow up and consider oncology referral.     3 month follow up for chronic pain and diabetes and thrombocytopenia.   Meds ordered this encounter  Medications  . DISCONTD: oxyCODONE-acetaminophen (PERCOCET) 7.5-325 MG tablet    Sig: Take 1 tablet by mouth every 6 (six) hours as needed for severe pain (3x a day max. May fill 02/10/16.).    Dispense:  90 tablet    Refill:  0  . metFORMIN (GLUCOPHAGE) 500 MG tablet    Sig: Take 1 tablet (500 mg total) by mouth daily with breakfast.    Dispense:  90 tablet    Refill:  3  . rosuvastatin (CRESTOR) 10 MG tablet    Sig: Take 1 tablet (10 mg total) by mouth daily.    Dispense:  90 tablet    Refill:  3

## 2015-12-21 NOTE — Assessment & Plan Note (Signed)
S: Low on 2 consecutive measures- also has some anemia.  A/P: now with low platelets in addition to chronic anemia. We will need to monitor closely. Will get cbc with diff and peripheral smear at follow up and consider oncology referral.

## 2015-12-21 NOTE — Assessment & Plan Note (Addendum)
S:Diabetes-glyburide- metformin 2.5-500 previously- ran out -was unaware-and a1c up.  A/P:Will start back on metformin 500mg  alone.  Repeat a1c on follow up

## 2015-12-21 NOTE — Assessment & Plan Note (Addendum)
S: LDL >70 in diabetic. LFTs now ok.  A/P:will start crestor 10mg 

## 2015-12-23 ENCOUNTER — Ambulatory Visit (INDEPENDENT_AMBULATORY_CARE_PROVIDER_SITE_OTHER): Payer: Commercial Managed Care - HMO | Admitting: Podiatry

## 2015-12-23 ENCOUNTER — Encounter: Payer: Self-pay | Admitting: Podiatry

## 2015-12-23 ENCOUNTER — Telehealth: Payer: Self-pay | Admitting: *Deleted

## 2015-12-23 VITALS — BP 138/72 | HR 87 | Temp 97.1°F | Resp 16

## 2015-12-23 DIAGNOSIS — L03119 Cellulitis of unspecified part of limb: Secondary | ICD-10-CM

## 2015-12-23 DIAGNOSIS — L02619 Cutaneous abscess of unspecified foot: Secondary | ICD-10-CM | POA: Diagnosis not present

## 2015-12-23 DIAGNOSIS — L97511 Non-pressure chronic ulcer of other part of right foot limited to breakdown of skin: Secondary | ICD-10-CM | POA: Diagnosis not present

## 2015-12-23 MED ORDER — CEPHALEXIN 500 MG PO CAPS: 500.0000 mg | ORAL_CAPSULE | Freq: Three times a day (TID) | ORAL | Status: DC

## 2015-12-23 NOTE — Telephone Encounter (Signed)
Pt spoke with scheduler A. Horton, states his foot has an open wound, with dark blood and foul smell.  I spoke with pt and told him he should be seen by a doctor as soon as possible, either today by Dr. Paulla Dolly, his PCP or the ER.  Pt states he would like to see Dr. Paulla Dolly today.  Transferred to schedulers.

## 2015-12-24 NOTE — Progress Notes (Signed)
Subjective:     Patient ID: [name], male   DOB: 06/21/1941, 75 y.o.   MRN: [id number]  HPI patient presents stating I was just concerned because I had some odor in my right foot. I started soaking and it seems better but I wanted to get it checked   Review of Systems     Objective:   Physical Exam Neurovascular status unchanged from previous visit with low grade breakdown of tissue in the distal portion of the second metatarsal right with localized superficial breakdown of tissue measuring approximately 2 cm in length by 1 cm in width with no deep exposure. I did not note any pus formation at this time and there was no proximal edema erythema or drainage noted and the patient has no indication of systemic infection    Assessment:     At risk patient with superficial breakdown of the distal second metatarsal right    Plan:     Patient refused x-rays at this time and I went ahead and using sterile instrumentation I did debridement of tissue I flushed the area and applied sterile dressing with triple antibiotic ointment. I gave instructions on soaks I placed on antibiotics cephalexin 500 3 times a day and I gave strict instructions of any proximal edema erythema drainage or any systemic signs of infection were to occur to contact us and go to the emergency room immediately. Seen back by Dr. Jacqualyn Posey in the next several weeks and is again made aware that at one point in the future amputation may be necessary

## 2016-01-11 ENCOUNTER — Encounter: Payer: Self-pay | Admitting: Podiatry

## 2016-01-11 ENCOUNTER — Ambulatory Visit (INDEPENDENT_AMBULATORY_CARE_PROVIDER_SITE_OTHER): Payer: Commercial Managed Care - HMO | Admitting: Podiatry

## 2016-01-11 DIAGNOSIS — E1149 Type 2 diabetes mellitus with other diabetic neurological complication: Secondary | ICD-10-CM

## 2016-01-11 DIAGNOSIS — L97511 Non-pressure chronic ulcer of other part of right foot limited to breakdown of skin: Secondary | ICD-10-CM

## 2016-01-12 DIAGNOSIS — E1149 Type 2 diabetes mellitus with other diabetic neurological complication: Secondary | ICD-10-CM | POA: Insufficient documentation

## 2016-01-12 NOTE — Progress Notes (Signed)
Patient ID: [name], male   DOB: 25-Jul-1941, 75 y.o.   MRN: [id number]  Subjective: 75 year old male presents the office today follow-up evaluation of ulceration to the right foot. He states that since starting the antibiotics he has had improvement in symptoms on discontinue have a small wound on the bottom of the right foot. This appears over the same area that he had the healed wound from previous. He also states he is tried cut his toenails history he cut his big toe quite a bit and it took a while for the area to stop bleeding.  Denies any systemic complaints such as fevers, chills, nausea, vomiting. No acute changes since last appointment, and no other complaints at this time.   Objective: AAO x3, NAD DP/PT pulses palpable bilaterally, CRT less than 3 seconds Protective sensation absent with [name] monofilament Hyperkeratotic lesion right foot submetatarsal one. Upon debridement as a small superficial granular wound measuring approximate 0.6 x 0.2 cm. It is superficial without any probing, undermining or tunneling. There is no surrounding erythema, ascending cellulitis, fluctuance, crepitus, malodor, drainage or pus. There is no edema or erythema to the foot. At the distal aspect of the right hallux is a superficial granular wound. This appears to be an area of skin that had peeled off from where he cut himself while trimming the toenails. Small dried blood underneath the toenail. No other open lesions or pre-ulcerative lesions at this time. No pain with calf compression, swelling, warmth, erythema  Assessment: Reoccurrence of right submetatarsal 1 ulceration with wound the distal hallux  Plan: -All treatment options discussed with the patient including all alternatives, risks, complications.  -Wound was debrided without complications or bleeding. Continue with antibiotic ointment dressing changes daily. -Recommended x-rays be declined -Continue daily dressing changes as well  to the wound of the tip of the toe -Finish this course of antibiotics. -Monitor for any clinical signs or symptoms of infection and directed to call the office immediately should any occur or go to the ER. -Long-term will need likely diabetic shoes due to the reoccurrence of wound. If infection occurs may require amputation. -Follow-up 2 weeks  [name], DPM

## 2016-01-25 ENCOUNTER — Ambulatory Visit: Payer: Commercial Managed Care - HMO | Admitting: Podiatry

## 2016-02-15 ENCOUNTER — Encounter: Payer: Self-pay | Admitting: Podiatry

## 2016-02-15 ENCOUNTER — Ambulatory Visit (INDEPENDENT_AMBULATORY_CARE_PROVIDER_SITE_OTHER): Payer: Commercial Managed Care - HMO | Admitting: Podiatry

## 2016-02-15 DIAGNOSIS — E1149 Type 2 diabetes mellitus with other diabetic neurological complication: Secondary | ICD-10-CM | POA: Diagnosis not present

## 2016-02-15 DIAGNOSIS — L97511 Non-pressure chronic ulcer of other part of right foot limited to breakdown of skin: Secondary | ICD-10-CM | POA: Diagnosis not present

## 2016-02-15 MED ORDER — AMOXICILLIN-POT CLAVULANATE 875-125 MG PO TABS: 1.0000 | ORAL_TABLET | Freq: Two times a day (BID) | ORAL | Status: DC

## 2016-02-15 NOTE — Progress Notes (Signed)
Patient ID: ASHE KOPPER, male   DOB: 1940-11-23, 75 y.o.   MRN: [id number]  Subjective: 75 year old male presents the office today follow-up evaluation of ulceration to the right foot. He states he is feeling occasional malodor along the wound. He denies any drainage or pus or any ascending redness or red streaks. Continuing with into buttock ointment dressing changes daily. He is finish antibiotics. Denies any systemic complaints such as fevers, chills, nausea, vomiting. No acute changes since last appointment, and no other complaints at this time.   Objective: AAO x3, NAD DP/PT pulses palpable bilaterally, CRT less than 3 seconds Protective sensation absent with [name] monofilament Hyperkeratotic lesion right foot submetatarsal one. Upon debridement as a small superficial granular wound measuring approximate 0.2 x 0.2 cm. It is superficial without any probing, undermining or tunneling. There is no surrounding erythema, ascending cellulitis, fluctuance, crepitus, drainage or pus. Slight malodor from some macerated tissue under the hypkerkertic periwound. There is no edema or erythema to the foot. No other open lesions or pre-ulcerative lesions at this time. No pain with calf compression, swelling, warmth, erythema  Assessment: Reoccurrence of right submetatarsal 1 ulceration with wound the distal hallux  Plan: -All treatment options discussed with the patient including all alternatives, risks, complications.  -Wound was debrided without complications or bleeding. Continue with antibiotic ointment dressing changes daily. The wound does appear to be improved compared to last appointment. However there is some slight malodor. Because this will start Augmentin. -Recommended x-rays be declined again today.  -Monitor for any clinical signs or symptoms of infection and directed to call the office immediately should any occur or go to the ER. -Long-term will need likely diabetic shoes due to  the reoccurrence of wound. If infection occurs may require amputation. -Follow-up 2-3 weeks or sooner if needed.   Celesta Gentile, DPM

## 2016-02-16 ENCOUNTER — Other Ambulatory Visit: Payer: Self-pay | Admitting: Family Medicine

## 2016-03-02 ENCOUNTER — Other Ambulatory Visit: Payer: Self-pay | Admitting: Podiatry

## 2016-03-10 ENCOUNTER — Ambulatory Visit: Payer: Commercial Managed Care - HMO | Admitting: Family Medicine

## 2016-03-14 ENCOUNTER — Encounter: Payer: Self-pay | Admitting: Podiatry

## 2016-03-14 ENCOUNTER — Ambulatory Visit (INDEPENDENT_AMBULATORY_CARE_PROVIDER_SITE_OTHER): Payer: Commercial Managed Care - HMO

## 2016-03-14 ENCOUNTER — Ambulatory Visit (INDEPENDENT_AMBULATORY_CARE_PROVIDER_SITE_OTHER): Payer: Commercial Managed Care - HMO | Admitting: Podiatry

## 2016-03-14 VITALS — BP 129/81 | HR 96 | Temp 98.4°F | Resp 18

## 2016-03-14 DIAGNOSIS — L89891 Pressure ulcer of other site, stage 1: Secondary | ICD-10-CM

## 2016-03-14 DIAGNOSIS — R52 Pain, unspecified: Secondary | ICD-10-CM | POA: Diagnosis not present

## 2016-03-14 DIAGNOSIS — L97511 Non-pressure chronic ulcer of other part of right foot limited to breakdown of skin: Secondary | ICD-10-CM

## 2016-03-14 MED ORDER — DOXYCYCLINE HYCLATE 100 MG PO TABS: 100.0000 mg | ORAL_TABLET | Freq: Two times a day (BID) | ORAL | Status: DC

## 2016-03-14 MED ORDER — SILVER SULFADIAZINE 1 % EX CREA: 1.0000 "application " | TOPICAL_CREAM | Freq: Every day | CUTANEOUS | Status: DC

## 2016-03-17 ENCOUNTER — Other Ambulatory Visit: Payer: Self-pay | Admitting: Podiatry

## 2016-03-18 NOTE — Telephone Encounter (Signed)
Refilled

## 2016-03-18 NOTE — Telephone Encounter (Signed)
Dr. Jacqualyn Posey, do you want to refill the Augmentin? Marivel Mcclarty I need to cancel if not.

## 2016-03-20 ENCOUNTER — Encounter: Payer: Self-pay | Admitting: Podiatry

## 2016-03-20 NOTE — Progress Notes (Signed)
Patient ID: [name], male   DOB: December 17, 1940, 75 y.o.   MRN: [id number]  Subjective: 75 year old male presents the office today follow-up evaluation of ulceration to the right foot. Since last appointment he states he had a blister popped to this area. He was mowing his grass when he notices happening. Since the blister popped and a larger wound to the right foot submetatarsal one. Denies any drainage or pus.Redness or red streaks. He has not been applying any ointment or dressing to the wound. Denies any systemic complaints such as fevers, chills, nausea, vomiting. No acute changes since last appointment, and no other complaints at this time.   Objective: AAO x3, NAD DP/PT pulses palpable bilaterally, CRT less than 3 seconds Protective sensation absent with [name] monofilament Hyperkeratotic lesion right foot submetatarsal one. There is epidermolysis from the previous blister. There is no fluid within the area however the skin remains. After debridement today the wound measures 2.5 x 1 semi-or superficial granular wound base. There is no probing, undermining or tunneling. There is no surrounding erythema, ascending cellulitis, fluctuance, crepitus, malodor, drainage or pus. Prominent metatarsal head submetatarsal one. No other open lesions or pre-ulcerative lesions at this time. No pain with calf compression, swelling, warmth, erythema  Assessment: Right submetatarsal 1 ulceration which is worsened.  Plan: -All treatment options discussed with the patient including all alternatives, risks, complications.  -Declined x-ray -Wound was debrided to granular, healthy wound base. Continue with Silvadene dressing changes daily. Offloading. Limited activity. Discussed with him not to the grass or do a lot of activity. Given the worsening of the wound will start Augmentin. -Monitor for any clinical signs or symptoms of infection and directed to call the office immediately should any occur or  go to the ER. -Follow-up as scheduled or sooner if any problems arise. In the meantime, encouraged to call the office with any questions, concerns, change in symptoms.   Celesta Gentile, DPM

## 2016-03-22 ENCOUNTER — Ambulatory Visit (INDEPENDENT_AMBULATORY_CARE_PROVIDER_SITE_OTHER): Payer: Commercial Managed Care - HMO | Admitting: Family Medicine

## 2016-03-22 ENCOUNTER — Encounter: Payer: Self-pay | Admitting: Family Medicine

## 2016-03-22 VITALS — BP 142/68 | HR 102 | Temp 97.8°F | Ht 72.0 in | Wt 195.0 lb

## 2016-03-22 DIAGNOSIS — D696 Thrombocytopenia, unspecified: Secondary | ICD-10-CM

## 2016-03-22 DIAGNOSIS — M549 Dorsalgia, unspecified: Secondary | ICD-10-CM

## 2016-03-22 DIAGNOSIS — E785 Hyperlipidemia, unspecified: Secondary | ICD-10-CM

## 2016-03-22 DIAGNOSIS — E1142 Type 2 diabetes mellitus with diabetic polyneuropathy: Secondary | ICD-10-CM

## 2016-03-22 DIAGNOSIS — G8929 Other chronic pain: Secondary | ICD-10-CM

## 2016-03-22 DIAGNOSIS — Z79899 Other long term (current) drug therapy: Secondary | ICD-10-CM | POA: Diagnosis not present

## 2016-03-22 DIAGNOSIS — D649 Anemia, unspecified: Secondary | ICD-10-CM | POA: Diagnosis not present

## 2016-03-22 MED ORDER — OXYCODONE-ACETAMINOPHEN 7.5-325 MG PO TABS: 1.0000 | ORAL_TABLET | Freq: Four times a day (QID) | ORAL | Status: DC | PRN

## 2016-03-22 NOTE — Progress Notes (Signed)
Pre visit review using our clinic review tool, if applicable. No additional management support is needed unless otherwise documented below in the visit note. 

## 2016-03-22 NOTE — Assessment & Plan Note (Signed)
S: poorly controlled on no medicine last time. restarted On metformin 500mg  alone. Lab Results  Component Value Date   HGBA1C 7.4* 12/01/2015   HGBA1C 6.9* 05/06/2015   HGBA1C 6.4 02/02/2015   A/P: update a1c-  May change to BID if a1c above 7.

## 2016-03-22 NOTE — Assessment & Plan Note (Signed)
S: pain 7/10 most times despite percocet 3x a day- worse without this and unable to be active at home. Med continues to allow activity.  A/P: percocet 3x a day- 3 months #90. Attempt UDS- not sure if he can urinate today

## 2016-03-22 NOTE — Patient Instructions (Addendum)
No changes until we call you  Labs before you leave including blood and urine.  Also pick up stool cards and send those back to [place]  We are trying to figure out why your oxygen carrying and clotting cells are low

## 2016-03-22 NOTE — Assessment & Plan Note (Signed)
Anemia S: anemia more chronic issue but platelets low on 2 consecutive checks. Denies change in baseline symptoms A/P: cbc, cmp, periphreal smear path review, hiv, hep c, b12, folate check today. Up to date colonoscopy 2008 with 10 year follow up- also get stool cards for potential anemia cause. Low threshold to refer to hematology given age- MDS?

## 2016-03-22 NOTE — Progress Notes (Signed)
Subjective:  [name] is a 75 y.o. year old very pleasant male patient who presents for/with See problem oriented charting ROS- no chest pain, shortness of breath. Continues to deal with chronic back pain. No nausea/vomiting. .see any ROS included in HPI as well.   Past Medical History-  Patient Active Problem List   Diagnosis Date Noted  . Thrombocytopenia ([place]) 12/21/2015    Priority: High  . B12 deficiency 08/20/2015    Priority: High  . 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 CKD (chronic kidney disease), stage III 05/06/2015    Priority: High  . ILD (interstitial lung disease) ([place]) 07/05/2012    Priority: High  . COPD UNSPECIFIED 12/15/2009    Priority: High  . Chronic back pain. Full history 06/12/14. Pain contract signed.  05/02/2007    Priority: High  . Diabetes mellitus type II, controlled ([place]) 04/06/2007    Priority: High  . Right foot ulcer ([place]) 10/09/2015    Priority: Medium  . Diabetic polyneuropathy associated with type 2 diabetes mellitus ([place]) 08/07/2015    Priority: Medium  . Fatty liver 11/04/2014    Priority: Medium  . BPH (benign prostatic hyperplasia) 06/20/2007    Priority: Medium  . Depression 05/02/2007    Priority: Medium  . Hyperlipidemia 04/06/2007    Priority: Medium  . Essential hypertension 04/06/2007    Priority: Medium  . DEGENERATIVE JOINT DISEASE 04/06/2007    Priority: Medium  . 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 Type II diabetes mellitus with neurological manifestations ([place]) 01/12/2016    Medications- reviewed and updated Current Outpatient Prescriptions  Medication Sig Dispense Refill  . acyclovir (ZOVIRAX) 400 MG tablet TAKE 1 TABLET TWICE DAILY 180 tablet 2  . amitriptyline (ELAVIL) 50 MG tablet TAKE 1 TABLET AT BEDTIME 90 tablet 3  . amLODipine (NORVASC) 10 MG tablet Take 1 tablet (10 mg total) by mouth daily. 90 tablet 3  . aspirin 81 MG tablet Take 81 mg by mouth daily.      .  Cholecalciferol 1000 UNITS capsule Take 1 capsule (1,000 Units total) by mouth 2 (two) times daily.    . finasteride (PROSCAR) 5 MG tablet TAKE 1 TABLET EVERY DAY 90 tablet 3  . gabapentin (NEURONTIN) 300 MG capsule TAKE 1 CAPSULE TWICE DAILY 180 capsule 1  . metFORMIN (GLUCOPHAGE) 500 MG tablet Take 1 tablet (500 mg total) by mouth daily with breakfast. 90 tablet 3  . Multiple Vitamin (MULTIVITAMIN) capsule Take 1 capsule by mouth daily.      Marland Kitchen omeprazole (PRILOSEC) 20 MG capsule TAKE 1 CAPSULE DAILY 30-60 MINUTES BEFORE FIRST MEAL OF THE DAY 90 capsule 3  . oxyCODONE-acetaminophen (PERCOCET) 7.5-325 MG tablet Take 1 tablet by mouth every 6 (six) hours as needed for severe pain (3x a day max. May fill 12/11/15.). 90 tablet 0  . rosuvastatin (CRESTOR) 10 MG tablet Take 1 tablet (10 mg total) by mouth daily. 90 tablet 3  . tamsulosin (FLOMAX) 0.4 MG CAPS capsule TAKE 1 CAPSULE EVERY DAY 90 capsule 1  . TRUEPLUS LANCETS 28G MISC Use to test blood sugars two times daily. Dx: E11.9 200 each 3   No current facility-administered medications for this visit.    Objective: BP 142/68 mmHg  Pulse 102  Temp(Src) 97.8 F (36.6 C) (Oral)  Ht 6' (1.829 m)  Wt 195 lb (88.451 kg)  BMI 26.44 kg/m2  SpO2 96% Gen: NAD, resting comfortably CV: RRR no murmurs rubs or  gallops Lungs: CTAB no crackles, wheeze, rhonchi Abdomen: soft/nontender/nondistended/normal bowel sounds.  Ext: 1+ edema Skin: warm, dry, no rash Neuro: grossly normal, moves all extremities  Assessment/Plan:  Thrombocytopenia (HCC) Anemia S: anemia more chronic issue but platelets low on 2 consecutive checks. Denies change in baseline symptoms A/P: cbc, cmp, periphreal smear path review, hiv, hep c, b12, folate check today. Up to date colonoscopy 2008 with 10 year follow up- also get stool cards for potential anemia cause. Low threshold to refer to hematology given age- MDS?   Diabetes mellitus type II, controlled ([place]) S: poorly  controlled on no medicine last time. restarted On metformin 500mg  alone. Lab Results  Component Value Date   HGBA1C 7.4* 12/01/2015   HGBA1C 6.9* 05/06/2015   HGBA1C 6.4 02/02/2015   A/P: update a1c-  May change to BID if a1c above 7.   Hyperlipidemia S: reasonably controlled. Started on crestor to try to get LDL <70 as diabetic and not on statin. No myalgias.   A/P: update direct ldl, continue crestor 10mg    Chronic back pain. Full history 06/12/14. Pain contract signed.  S: pain 7/10 most times despite percocet 3x a day- worse without this and unable to be active at home. Med continues to allow activity.  A/P: percocet 3x a day- 3 months #90. Attempt UDS- not sure if he can urinate today    Return in about 3 months (around 06/22/2016).  Orders Placed This Encounter  Procedures  . CBC with Differential/Platelet  . Comprehensive metabolic panel    [facility]  . Pathologist smear review  . HIV antibody    [facility]  . Hepatitis C antibody, reflex    [facility]  . Vitamin B12  . Folate RBC  . Hemoglobin A1c    [facility]  . LDL cholesterol, direct    [facility]  . Drug Screen, Urine  . POC Hemoccult Bld/Stl (3-Cd Home Screen)    Send home    Standing Status: Future     Number of Occurrences:      Standing Expiration Date: 03/22/2017    Meds ordered this encounter  Medications  . oxyCODONE-acetaminophen (PERCOCET) 7.5-325 MG tablet    Sig: Take 1 tablet by mouth every 6 (six) hours as needed for severe pain (3x a day max. May fill 03/22/16.).    Dispense:  90 tablet    Refill:  0  . DISCONTD: oxyCODONE-acetaminophen (PERCOCET) 7.5-325 MG tablet    Sig: Take 1 tablet by mouth every 6 (six) hours as needed for severe pain (3x a day max. May fill 04/21/16.).    Dispense:  90 tablet    Refill:  0  . oxyCODONE-acetaminophen (PERCOCET) 7.5-325 MG tablet    Sig: Take 1 tablet by mouth every 6 (six) hours as needed for severe pain (3x a day max. May fill 05/22/16.).    Dispense:  90 tablet     Refill:  0    Return precautions advised.  [name], MD

## 2016-03-22 NOTE — Assessment & Plan Note (Signed)
S: reasonably controlled. Started on crestor to try to get LDL <70 as diabetic and not on statin. No myalgias.   A/P: update direct ldl, continue crestor 10mg 

## 2016-03-23 LAB — COMPREHENSIVE METABOLIC PANEL
ALBUMIN: 4 g/dL (ref 3.5–5.2)
ALT: 10 U/L (ref 0–53)
AST: 18 U/L (ref 0–37)
Alkaline Phosphatase: 109 U/L (ref 39–117)
BUN: 18 mg/dL (ref 6–23)
CO2: 28 meq/L (ref 19–32)
CREATININE: 1.4 mg/dL (ref 0.40–1.50)
Calcium: 9.1 mg/dL (ref 8.4–10.5)
Chloride: 104 mEq/L (ref 96–112)
GFR: 52.49 mL/min — AB (ref >60.00)
GLUCOSE: 186 mg/dL — AB (ref 70–99)
POTASSIUM: 4.1 meq/L (ref 3.5–5.1)
Sodium: 142 mEq/L (ref 135–145)
TOTAL PROTEIN: 7 g/dL (ref 6.0–8.3)
Total Bilirubin: 0.4 mg/dL (ref 0.2–1.2)

## 2016-03-23 LAB — CBC WITH DIFFERENTIAL/PLATELET
BASOS PCT: 0.7 % (ref 0.0–3.0)
Basophils Absolute: 0.1 10*3/uL (ref 0.0–0.1)
EOS ABS: 0.1 10*3/uL (ref 0.0–0.7)
Eosinophils Relative: 1.5 % (ref 0.0–5.0)
HCT: 34.6 % — ABNORMAL LOW (ref 39.0–52.0)
Hemoglobin: 11.7 g/dL — ABNORMAL LOW (ref 13.0–17.0)
LYMPHS ABS: 2.3 10*3/uL (ref 0.7–4.0)
Lymphocytes Relative: 28.1 % (ref 12.0–46.0)
MCHC: 33.7 g/dL (ref 30.0–36.0)
MCV: 94.5 fl (ref 78.0–100.0)
MONO ABS: 0.3 10*3/uL (ref 0.1–1.0)
Monocytes Relative: 4.1 % (ref 3.0–12.0)
NEUTROS ABS: 5.5 10*3/uL (ref 1.4–7.7)
Neutrophils Relative %: 65.6 % (ref 43.0–77.0)
Platelets: 173 10*3/uL (ref 150.0–400.0)
RBC: 3.66 Mil/uL — ABNORMAL LOW (ref 4.22–5.81)
RDW: 14.4 % (ref 11.5–15.5)
WBC: 8.3 10*3/uL (ref 4.0–10.5)

## 2016-03-23 LAB — FOLATE RBC: RBC Folate: 811 ng/mL (ref >280)

## 2016-03-23 LAB — VITAMIN B12: Vitamin B-12: 543 pg/mL (ref 211–911)

## 2016-03-23 LAB — HIV ANTIBODY (ROUTINE TESTING W REFLEX): HIV 1&2 Ab, 4th Generation: NONREACTIVE

## 2016-03-23 LAB — HEPATITIS C ANTIBODY: HCV Ab: REACTIVE — AB

## 2016-03-23 LAB — LDL CHOLESTEROL, DIRECT: LDL DIRECT: 54 mg/dL

## 2016-03-23 LAB — HEMOGLOBIN A1C: HEMOGLOBIN A1C: 7 % — AB (ref 4.6–6.5)

## 2016-03-24 LAB — HEPATITIS C RNA QUANTITATIVE: HCV QUANT: NOT DETECTED [IU]/mL (ref <15)

## 2016-03-24 LAB — PATHOLOGIST SMEAR REVIEW

## 2016-03-28 ENCOUNTER — Ambulatory Visit: Payer: Commercial Managed Care - HMO | Admitting: Podiatry

## 2016-03-29 ENCOUNTER — Encounter: Payer: Self-pay | Admitting: Family Medicine

## 2016-03-29 DIAGNOSIS — R768 Other specified abnormal immunological findings in serum: Secondary | ICD-10-CM | POA: Insufficient documentation

## 2016-03-29 LAB — PAIN MGMT, PROFILE 1 W/O CONF, U
AMPHETAMINES: NEGATIVE ng/mL (ref <500)
BARBITURATES: NEGATIVE ng/mL (ref <300)
Benzodiazepines: NEGATIVE ng/mL (ref <100)
COCAINE METABOLITE: NEGATIVE ng/mL (ref <150)
Creatinine: 208 mg/dL (ref >=20.0)
MARIJUANA METABOLITE: NEGATIVE ng/mL (ref <20)
Methadone Metabolite: NEGATIVE ng/mL (ref <100)
OPIATES: POSITIVE ng/mL — AB (ref <100)
OXIDANT: NEGATIVE ug/mL (ref <200)
OXYCODONE: POSITIVE ng/mL — AB (ref <100)
Phencyclidine: NEGATIVE ng/mL (ref <25)
pH: 6.88 (ref 4.5–9.0)

## 2016-03-30 LAB — POC HEMOCCULT BLD/STL (HOME/3-CARD/SCREEN)
Card #2 Fecal Occult Blod, POC: NEGATIVE
Card #3 Fecal Occult Blood, POC: NEGATIVE
FECAL OCCULT BLD: NEGATIVE

## 2016-03-30 NOTE — Addendum Note (Signed)
Addended by: Elmer Picker on: 03/30/2016 01:24 PM   Modules accepted: Orders

## 2016-03-31 ENCOUNTER — Other Ambulatory Visit: Payer: Self-pay | Admitting: Podiatry

## 2016-03-31 ENCOUNTER — Telehealth: Payer: Self-pay | Admitting: *Deleted

## 2016-03-31 NOTE — Telephone Encounter (Signed)
Do augmentin 875 bid. He does not have an appointment right now because he missed his appointment this week and has not called to reschedule. Please have him make an appointment.

## 2016-03-31 NOTE — Telephone Encounter (Addendum)
Pt request refill of Doxycycline.  Dr. Jacqualyn Posey ordered Augmentin 875mg  and to make an appt.  I spoke with pt, he states he did not request the Doxycycline, that he is on Doxycycline currently, I transferred pt to schedulers for an appt.

## 2016-04-08 ENCOUNTER — Ambulatory Visit: Payer: Commercial Managed Care - HMO | Admitting: Podiatry

## 2016-04-13 DIAGNOSIS — Z947 Corneal transplant status: Secondary | ICD-10-CM | POA: Diagnosis not present

## 2016-04-13 DIAGNOSIS — Z961 Presence of intraocular lens: Secondary | ICD-10-CM | POA: Diagnosis not present

## 2016-04-22 ENCOUNTER — Encounter: Payer: Self-pay | Admitting: Podiatry

## 2016-04-22 ENCOUNTER — Ambulatory Visit (INDEPENDENT_AMBULATORY_CARE_PROVIDER_SITE_OTHER): Payer: Commercial Managed Care - HMO | Admitting: Podiatry

## 2016-04-22 VITALS — BP 138/77 | HR 69 | Resp 12

## 2016-04-22 DIAGNOSIS — E1149 Type 2 diabetes mellitus with other diabetic neurological complication: Secondary | ICD-10-CM

## 2016-04-22 DIAGNOSIS — L97511 Non-pressure chronic ulcer of other part of right foot limited to breakdown of skin: Secondary | ICD-10-CM | POA: Diagnosis not present

## 2016-05-05 NOTE — Progress Notes (Signed)
Patient ID: [name], male   DOB: 08-09-1941, 75 y.o.   MRN: [id number]  Subjective: 75 year old male presents the office today follow-up evaluation of ulceration to the right foot. He states that the wound looks "much better". He denies any drainage or pus or any swelling. He does to that he is still on his feet quite a bit. He is trying to change the bandage daily with antibiotic ointment.  Denies any systemic complaints such as fevers, chills, nausea, vomiting. No acute changes since last appointment, and no other complaints at this time.   Objective: AAO x3, NAD DP/PT pulses palpable bilaterally, CRT less than 3 seconds Protective sensation absent with [name] monofilament Hyperkeratotic lesion right foot submetatarsal one. Upon debridement the wound appears to be almost healed at this point however does remain somewhat superficial granular wound base. There is no probing, undermining or coming. There is no swelling edema, erythema, increase in warmth. No ascending synovitis. No malodor. No fluctuance or crepitus.  No other open lesions or pre-ulcerative lesions at this time. No pain with calf compression, swelling, warmth, erythema  Assessment: Right submetatarsal 1 ulceration which is improved.  Plan: -All treatment options discussed with the patient including all alternatives, risks, complications.  -Wound was debrided to granular, healthy wound base. Continue with Silvadene dressing changes daily.  -Recommended total nail activity off on the grass and doing a lot of work until the wound heals. -Offloading. -Monitor for signs or symptoms of infection to the ER should any occur. -Follow-up as scheduled or sooner if any problems arise. In the meantime, encouraged to call the office with any questions, concerns, change in symptoms.   Celesta Gentile, DPM

## 2016-05-18 ENCOUNTER — Other Ambulatory Visit: Payer: Self-pay | Admitting: Family Medicine

## 2016-05-18 DIAGNOSIS — I159 Secondary hypertension, unspecified: Secondary | ICD-10-CM

## 2016-06-03 ENCOUNTER — Ambulatory Visit (INDEPENDENT_AMBULATORY_CARE_PROVIDER_SITE_OTHER): Payer: Commercial Managed Care - HMO | Admitting: Podiatry

## 2016-06-03 ENCOUNTER — Encounter: Payer: Self-pay | Admitting: Podiatry

## 2016-06-03 DIAGNOSIS — E1149 Type 2 diabetes mellitus with other diabetic neurological complication: Secondary | ICD-10-CM

## 2016-06-03 DIAGNOSIS — L89891 Pressure ulcer of other site, stage 1: Secondary | ICD-10-CM | POA: Diagnosis not present

## 2016-06-03 DIAGNOSIS — L97511 Non-pressure chronic ulcer of other part of right foot limited to breakdown of skin: Secondary | ICD-10-CM

## 2016-06-05 NOTE — Progress Notes (Signed)
Patient ID: [name], male   DOB: [birth year], 75 y.o.   MRN: [id number]  Subjective: 75 year old male presents the office today follow-up evaluation of ulceration to the right foot. He states he is doing better. He is still notices small amount of bleeding at times in the area and denies any pus or edema or erythema. He does continue to become very active on his feet. Decrease were regular shoe. He just antibiotic ointment along the area daily.  Denies any systemic complaints such as fevers, chills, nausea, vomiting. No acute changes since last appointment, and no other complaints at this time.   Objective: AAO x3, NAD DP/PT pulses palpable bilaterally, CRT less than 3 seconds Protective sensation absent with [name] monofilament Hyperkeratotic lesion right foot submetatarsal one. Upon of rheumatoid to superficial granular wound measuring approximate 0.2 x 0.2 cm in his improvement in size. There is no probing, undermining or tunneling. There is no surrounding edema, erythema, increase in warmth. No other open lesions or pre-ulcerative lesions are identified today. No pain with calf compression, swelling, warmth, erythema  Assessment: Right submetatarsal 1 ulceration which is healing  Plan: -All treatment options discussed with the patient including all alternatives, risks, complications.  -Wound sharply debrided today to granular tissue. Continue antibiotic ointment dressing changes daily. Can leave the area uncovered at night. Monitor for signs or symptoms of infection. Offloading pads were dispensed. Limit activity. Must wear offloading pads at all times. Follow-up as scheduled or sooner if needed. Monitor for signs or symptoms of infection and directed to the ER should any occur call the office.  Celesta Gentile, DPM

## 2016-06-22 ENCOUNTER — Encounter: Payer: Self-pay | Admitting: Family Medicine

## 2016-06-22 ENCOUNTER — Ambulatory Visit (INDEPENDENT_AMBULATORY_CARE_PROVIDER_SITE_OTHER): Payer: Commercial Managed Care - HMO | Admitting: Family Medicine

## 2016-06-22 VITALS — BP 130/70 | HR 92 | Temp 97.7°F | Wt 194.6 lb

## 2016-06-22 DIAGNOSIS — Z23 Encounter for immunization: Secondary | ICD-10-CM | POA: Diagnosis not present

## 2016-06-22 DIAGNOSIS — E1142 Type 2 diabetes mellitus with diabetic polyneuropathy: Secondary | ICD-10-CM | POA: Diagnosis not present

## 2016-06-22 DIAGNOSIS — E785 Hyperlipidemia, unspecified: Secondary | ICD-10-CM | POA: Diagnosis not present

## 2016-06-22 DIAGNOSIS — M549 Dorsalgia, unspecified: Secondary | ICD-10-CM | POA: Diagnosis not present

## 2016-06-22 DIAGNOSIS — G8929 Other chronic pain: Secondary | ICD-10-CM

## 2016-06-22 MED ORDER — OXYCODONE-ACETAMINOPHEN 7.5-325 MG PO TABS: 1.0000 | ORAL_TABLET | Freq: Four times a day (QID) | ORAL | 0 refills | Status: DC | PRN

## 2016-06-22 MED ORDER — OXYCODONE-ACETAMINOPHEN 7.5-325 MG PO TABS
1.0000 | ORAL_TABLET | Freq: Four times a day (QID) | ORAL | 0 refills | Status: DC | PRN
Start: 2016-06-22 — End: 2016-09-19

## 2016-06-22 MED ORDER — TETANUS-DIPHTH-ACELL PERTUSSIS 5-2.5-18.5 LF-MCG/0.5 IM SUSP: 0.5000 mL | Freq: Once | INTRAMUSCULAR | 0 refills | Status: AC

## 2016-06-22 NOTE — Progress Notes (Signed)
Pre visit review using our clinic review tool, if applicable. No additional management support is needed unless otherwise documented below in the visit note. 

## 2016-06-22 NOTE — Progress Notes (Signed)
Subjective:  [name] is a 75 y.o. year old very pleasant male patient who presents for/with See problem oriented charting ROS- No chest pain or shortness of breath. No headache or blurry vision. Chronic back pain noted.see any ROS included in HPI as well.   Past Medical History-  Patient Active Problem List   Diagnosis Date Noted  . Thrombocytopenia ([place]) 12/21/2015    Priority: High  . B12 deficiency 08/20/2015    Priority: High  . 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 CKD (chronic kidney disease), stage III 05/06/2015    Priority: High  . ILD (interstitial lung disease) ([place]) 07/05/2012    Priority: High  . COPD UNSPECIFIED 12/15/2009    Priority: High  . Chronic back pain. Full history 06/12/14. Pain contract signed.  05/02/2007    Priority: High  . Diabetes mellitus type II, controlled ([place]) 04/06/2007    Priority: High  . Right foot ulcer ([place]) 10/09/2015    Priority: Medium  . Diabetic polyneuropathy associated with type 2 diabetes mellitus ([place]) 08/07/2015    Priority: Medium  . Fatty liver 11/04/2014    Priority: Medium  . BPH (benign prostatic hyperplasia) 06/20/2007    Priority: Medium  . Depression 05/02/2007    Priority: Medium  . Hyperlipidemia 04/06/2007    Priority: Medium  . Essential hypertension 04/06/2007    Priority: Medium  . DEGENERATIVE JOINT DISEASE 04/06/2007    Priority: Medium  . 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 positive 03/29/2016  . Type II diabetes mellitus with neurological manifestations ([place]) 01/12/2016    Medications- reviewed and updated Current Outpatient Prescriptions  Medication Sig Dispense Refill  . acyclovir (ZOVIRAX) 400 MG tablet TAKE 1 TABLET TWICE DAILY 180 tablet 2  . amitriptyline (ELAVIL) 50 MG tablet TAKE 1 TABLET AT BEDTIME 90 tablet 3  . amLODipine (NORVASC) 10 MG tablet TAKE 1 TABLET DAILY. 90 tablet 3  . aspirin 81 MG tablet Take 81 mg by mouth daily.       . Cholecalciferol 1000 UNITS capsule Take 1 capsule (1,000 Units total) by mouth 2 (two) times daily.    . finasteride (PROSCAR) 5 MG tablet TAKE 1 TABLET EVERY DAY 90 tablet 3  . gabapentin (NEURONTIN) 300 MG capsule TAKE 1 CAPSULE TWICE DAILY 180 capsule 1  . metFORMIN (GLUCOPHAGE) 500 MG tablet Take 1 tablet (500 mg total) by mouth daily with breakfast. 90 tablet 3  . Multiple Vitamin (MULTIVITAMIN) capsule Take 1 capsule by mouth daily.      Marland Kitchen omeprazole (PRILOSEC) 20 MG capsule TAKE 1 CAPSULE DAILY 30-60 MINUTES BEFORE FIRST MEAL OF THE DAY 90 capsule 3  . oxyCODONE-acetaminophen (PERCOCET) 7.5-325 MG tablet Take 1 tablet by mouth every 6 (six) hours as needed for severe pain (3x a day max. May fill 06/22/16.). 90 tablet 0  . oxyCODONE-acetaminophen (PERCOCET) 7.5-325 MG tablet Take 1 tablet by mouth every 6 (six) hours as needed for severe pain (3x a day max. May fill 07/22/16.). 90 tablet 0  . rosuvastatin (CRESTOR) 10 MG tablet Take 1 tablet (10 mg total) by mouth daily. 90 tablet 3  . tamsulosin (FLOMAX) 0.4 MG CAPS capsule TAKE 1 CAPSULE EVERY DAY 90 capsule 1  . TRUEPLUS LANCETS 28G MISC Use to test blood sugars two times daily. Dx: E11.9 200 each 3  . oxyCODONE-acetaminophen (PERCOCET) 7.5-325 MG tablet Take 1 tablet by mouth every 6 (six) hours as needed for severe  pain. 3x a day max. May fill 08/22/16. 90 tablet 0  . Tdap (BOOSTRIX) 5-2.5-18.5 LF-MCG/0.5 injection Inject 0.5 mLs into the muscle once. 0.5 mL 0   No current facility-administered medications for this visit.     Objective: BP 130/70 (BP Location: Left Arm, Patient Position: Sitting, Cuff Size: Normal)   Pulse 92   Temp 97.7 F (36.5 C) (Oral)   Wt 194 lb 9.6 oz (88.3 kg)   SpO2 96%   BMI 26.39 kg/m  Gen: NAD, resting comfortably CV: RRR no murmurs rubs or gallops Lungs: CTAB no crackles, wheeze, rhonchi Abdomen:obese Ext: no edema Skin: warm, dry  Assessment/Plan:  Hyperlipidemia S: improved  controlled on crestor 10mg  with LDL <70. No myalgias.  Lab Results  Component Value Date   CHOL 156 12/01/2015   HDL 35.50 (L) 12/01/2015   LDLCALC 95 12/01/2015   LDLDIRECT 54.0 03/22/2016   TRIG 124.0 12/01/2015   CHOLHDL 4 12/01/2015   A/P: continue current medicine   Chronic back pain. Full history 06/12/14. Pain contract signed.  S: stable chronic bback pain on percocet 3x a day with 7/10 pain stable. Allows him to be active around the house- wife with him today and confirms. 3 months 3x a day A/P: refilled meds today #90 per month of percocet 7.5-325mg   Diabetes S: suspect reasonably controlled On metformin 500mg  alone CBGs- runs 150s in mornings and nights.unchanged from last visit when a1c was 7 Lab Results  Component Value Date   HGBA1C 7.0 (H) 03/22/2016   HGBA1C 7.4 (H) 12/01/2015   HGBA1C 6.9 (H) 05/06/2015   A/P: update a1c next visit- continue current meds for now  Health Maintenance Due  Topic Date Due  . OPHTHALMOLOGY EXAM - request records 06/13/2015  . TETANUS/TDAP - sent to pharmacy 06/10/2016   Return in about 3 months (around 09/21/2016). a1c and likely bmet at least net visit. Cbc with thrombocytopenia history  Orders Placed This Encounter  Procedures  . Flu vaccine HIGH DOSE PF    Meds ordered this encounter  Medications  . Tdap (BOOSTRIX) 5-2.5-18.5 LF-MCG/0.5 injection    Sig: Inject 0.5 mLs into the muscle once.    Dispense:  0.5 mL    Refill:  0  . oxyCODONE-acetaminophen (PERCOCET) 7.5-325 MG tablet    Sig: Take 1 tablet by mouth every 6 (six) hours as needed for severe pain (3x a day max. May fill 06/22/16.).    Dispense:  90 tablet    Refill:  0  . oxyCODONE-acetaminophen (PERCOCET) 7.5-325 MG tablet    Sig: Take 1 tablet by mouth every 6 (six) hours as needed for severe pain (3x a day max. May fill 07/22/16.).    Dispense:  90 tablet    Refill:  0  . oxyCODONE-acetaminophen (PERCOCET) 7.5-325 MG tablet    Sig: Take 1 tablet by mouth  every 6 (six) hours as needed for severe pain. 3x a day max. May fill 08/22/16.    Dispense:  90 tablet    Refill:  0    Return precautions advised.  [name], MD

## 2016-06-22 NOTE — Assessment & Plan Note (Signed)
S: stable chronic bback pain on percocet 3x a day with 7/10 pain stable. Allows him to be active around the house- wife with him today and confirms. 3 months 3x a day A/P: refilled meds today #90 per month of percocet 7.5-325mg 

## 2016-06-22 NOTE — Assessment & Plan Note (Signed)
S: improved controlled on crestor 10mg  with LDL <70. No myalgias.  Lab Results  Component Value Date   CHOL 156 12/01/2015   HDL 35.50 (L) 12/01/2015   LDLCALC 95 12/01/2015   LDLDIRECT 54.0 03/22/2016   TRIG 124.0 12/01/2015   CHOLHDL 4 12/01/2015   A/P: continue current medicine

## 2016-06-22 NOTE — Patient Instructions (Addendum)
Sign release of information at the check out desk for recors from eye doctor  High dose flu shot  Get your tetanus shot for both of you at the pharmacy  Check labs next visit

## 2016-07-15 ENCOUNTER — Encounter: Payer: Self-pay | Admitting: Podiatry

## 2016-07-15 ENCOUNTER — Ambulatory Visit (INDEPENDENT_AMBULATORY_CARE_PROVIDER_SITE_OTHER): Payer: Commercial Managed Care - HMO | Admitting: Podiatry

## 2016-07-15 VITALS — BP 145/82 | HR 85 | Temp 98.1°F | Resp 14

## 2016-07-15 DIAGNOSIS — L84 Corns and callosities: Secondary | ICD-10-CM | POA: Diagnosis not present

## 2016-07-15 DIAGNOSIS — E1149 Type 2 diabetes mellitus with other diabetic neurological complication: Secondary | ICD-10-CM

## 2016-07-15 NOTE — Progress Notes (Signed)
Patient ID: [name], male   DOB: 1941-03-15, 75 y.o.   MRN: [id number]  Subjective: 75 year old male presents the office today follow-up evaluation of ulceration to the right foot. He states that the areas painful when he puts a lot of pressure to the area is a callus is formed. Denies any drainage or redness or swelling. Denies any systemic complaints such as fevers, chills, nausea, vomiting. No acute changes since last appointment, and no other complaints at this time.   Objective: AAO x3, NAD DP/PT pulses palpable bilaterally, CRT less than 3 seconds Protective sensation absent with [name] monofilament Hyperkeratotic lesion right foot submetatarsal one. Upon debridement there is no underlying ulceration, drainage or signs of infection the areas pre-ulcerative however. There is prominent metatarsal heads plantarly with atrophy of the fat pad. There is no other open lesions or pre-ulcerative lesions identified at today's visit. No pain with calf compression, swelling, warmth, erythema  Assessment: Right submetatarsal 1 pre-ulcerative lesion  Plan: -All treatment options discussed with the patient including all alternatives, risks, complications.  -Hyperkeratotic lesion was debrided without complications or bleeding. Offloading pads were dispensed. Monitor for any skin breakdown. Follow up in 9 weeks or sooner if needed. Call any questions concerns the meantime.  Celesta Gentile, DPM

## 2016-08-23 ENCOUNTER — Other Ambulatory Visit: Payer: Self-pay | Admitting: Family Medicine

## 2016-09-19 ENCOUNTER — Encounter: Payer: Self-pay | Admitting: Family Medicine

## 2016-09-19 ENCOUNTER — Ambulatory Visit (INDEPENDENT_AMBULATORY_CARE_PROVIDER_SITE_OTHER): Payer: Commercial Managed Care - HMO | Admitting: Family Medicine

## 2016-09-19 ENCOUNTER — Encounter: Payer: Self-pay | Admitting: Podiatry

## 2016-09-19 ENCOUNTER — Ambulatory Visit (INDEPENDENT_AMBULATORY_CARE_PROVIDER_SITE_OTHER): Payer: Commercial Managed Care - HMO | Admitting: Podiatry

## 2016-09-19 DIAGNOSIS — Q828 Other specified congenital malformations of skin: Secondary | ICD-10-CM | POA: Diagnosis not present

## 2016-09-19 DIAGNOSIS — G8929 Other chronic pain: Secondary | ICD-10-CM | POA: Diagnosis not present

## 2016-09-19 DIAGNOSIS — I10 Essential (primary) hypertension: Secondary | ICD-10-CM

## 2016-09-19 DIAGNOSIS — E1149 Type 2 diabetes mellitus with other diabetic neurological complication: Secondary | ICD-10-CM | POA: Diagnosis not present

## 2016-09-19 DIAGNOSIS — E1142 Type 2 diabetes mellitus with diabetic polyneuropathy: Secondary | ICD-10-CM | POA: Diagnosis not present

## 2016-09-19 DIAGNOSIS — M545 Low back pain, unspecified: Secondary | ICD-10-CM

## 2016-09-19 DIAGNOSIS — L84 Corns and callosities: Secondary | ICD-10-CM

## 2016-09-19 LAB — COMPREHENSIVE METABOLIC PANEL
ALT: 12 U/L (ref 0–53)
AST: 19 U/L (ref 0–37)
Albumin: 4.1 g/dL (ref 3.5–5.2)
Alkaline Phosphatase: 113 U/L (ref 39–117)
BILIRUBIN TOTAL: 0.4 mg/dL (ref 0.2–1.2)
BUN: 16 mg/dL (ref 6–23)
CO2: 29 meq/L (ref 19–32)
CREATININE: 1.16 mg/dL (ref 0.40–1.50)
Calcium: 9.2 mg/dL (ref 8.4–10.5)
Chloride: 104 mEq/L (ref 96–112)
GFR: 65.12 mL/min (ref >60.00)
GLUCOSE: 178 mg/dL — AB (ref 70–99)
Potassium: 4.9 mEq/L (ref 3.5–5.1)
Sodium: 141 mEq/L (ref 135–145)
Total Protein: 6.8 g/dL (ref 6.0–8.3)

## 2016-09-19 LAB — CBC
HCT: 35.6 % — ABNORMAL LOW (ref 39.0–52.0)
Hemoglobin: 12.3 g/dL — ABNORMAL LOW (ref 13.0–17.0)
MCHC: 34.5 g/dL (ref 30.0–36.0)
MCV: 95.4 fl (ref 78.0–100.0)
Platelets: 135 10*3/uL — ABNORMAL LOW (ref 150.0–400.0)
RBC: 3.73 Mil/uL — ABNORMAL LOW (ref 4.22–5.81)
RDW: 14.2 % (ref 11.5–15.5)
WBC: 6.7 10*3/uL (ref 4.0–10.5)

## 2016-09-19 LAB — HEMOGLOBIN A1C: Hgb A1c MFr Bld: 6.9 % — ABNORMAL HIGH (ref 4.6–6.5)

## 2016-09-19 MED ORDER — OXYCODONE-ACETAMINOPHEN 7.5-325 MG PO TABS: 1.0000 | ORAL_TABLET | Freq: Four times a day (QID) | ORAL | 0 refills | Status: DC | PRN

## 2016-09-19 NOTE — Patient Instructions (Addendum)
Sign release of information at the check out desk for eye exam  Labs before you leave  You have gained 3 lbs- need to lose weight as can strain the back  Refilled pain meds 3 months- see me back before running out

## 2016-09-19 NOTE — Assessment & Plan Note (Signed)
S: controlled on amlodipine 10mg .  BP Readings from Last 3 Encounters:  09/19/16 118/64  07/15/16 (!) 145/82  06/22/16 130/70  A/P:Continue current meds:  Much better control this visit

## 2016-09-19 NOTE — Progress Notes (Signed)
Subjective:  [name] is a 75 y.o. year old very pleasant male patient who presents for/with See problem oriented charting ROS- no hypoglycemia. No chest pain or shortness of breath. Chronic back pain stable   Past Medical History-  Patient Active Problem List   Diagnosis Date Noted  . Thrombocytopenia ([name]) 12/21/2015    Priority: High  . B12 deficiency 08/20/2015    Priority: High  . 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 CKD (chronic kidney disease), stage III 05/06/2015    Priority: High  . ILD (interstitial lung disease) ([name]) 07/05/2012    Priority: High  . COPD UNSPECIFIED 12/15/2009    Priority: High  . Chronic back pain. Full history 06/12/14. Pain contract signed.  05/02/2007    Priority: High  . Diabetes mellitus type II, controlled ([place]) 04/06/2007    Priority: High  . Right foot ulcer ([name]) 10/09/2015    Priority: Medium  . Diabetic polyneuropathy associated with type 2 diabetes mellitus ([place]) 08/07/2015    Priority: Medium  . Fatty liver 11/04/2014    Priority: Medium  . BPH (benign prostatic hyperplasia) 06/20/2007    Priority: Medium  . Depression 05/02/2007    Priority: Medium  . Hyperlipidemia 04/06/2007    Priority: Medium  . Essential hypertension 04/06/2007    Priority: Medium  . DEGENERATIVE JOINT DISEASE 04/06/2007    Priority: Medium  . 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 positive 03/29/2016  . Type II diabetes mellitus with neurological manifestations ([place]) 01/12/2016    Medications- reviewed and updated Current Outpatient Prescriptions  Medication Sig Dispense Refill  . acyclovir (ZOVIRAX) 400 MG tablet TAKE 1 TABLET TWICE DAILY 180 tablet 2  . amitriptyline (ELAVIL) 50 MG tablet TAKE 1 TABLET AT BEDTIME 90 tablet 3  . amLODipine (NORVASC) 10 MG tablet TAKE 1 TABLET DAILY. 90 tablet 3  . aspirin 81 MG tablet Take 81 mg by mouth daily.      . Cholecalciferol 1000 UNITS capsule Take 1  capsule (1,000 Units total) by mouth 2 (two) times daily.    . finasteride (PROSCAR) 5 MG tablet TAKE 1 TABLET EVERY DAY 90 tablet 3  . gabapentin (NEURONTIN) 300 MG capsule TAKE 1 CAPSULE TWICE DAILY 180 capsule 1  . metFORMIN (GLUCOPHAGE) 500 MG tablet Take 1 tablet (500 mg total) by mouth daily with breakfast. 90 tablet 3  . Multiple Vitamin (MULTIVITAMIN) capsule Take 1 capsule by mouth daily.      Marland Kitchen omeprazole (PRILOSEC) 20 MG capsule TAKE 1 CAPSULE DAILY 30 TO 60 MINUTES BEFORE FIRST MEAL OF THE DAY 90 capsule 3  . oxyCODONE-acetaminophen (PERCOCET) 7.5-325 MG tablet Take 1 tablet by mouth every 6 (six) hours as needed for severe pain (3x a day max. May fill 06/22/16.). 90 tablet 0  . oxyCODONE-acetaminophen (PERCOCET) 7.5-325 MG tablet Take 1 tablet by mouth every 6 (six) hours as needed for severe pain (3x a day max. May fill 07/22/16.). 90 tablet 0  . oxyCODONE-acetaminophen (PERCOCET) 7.5-325 MG tablet Take 1 tablet by mouth every 6 (six) hours as needed for severe pain. 3x a day max. May fill 08/22/16. 90 tablet 0  . rosuvastatin (CRESTOR) 10 MG tablet Take 1 tablet (10 mg total) by mouth daily. 90 tablet 3  . tamsulosin (FLOMAX) 0.4 MG CAPS capsule TAKE 1 CAPSULE EVERY DAY 90 capsule 1  . TRUEPLUS LANCETS 28G MISC Use to test blood sugars two times daily. Dx: E11.9  200 each 3   No current facility-administered medications for this visit.     Objective: BP 118/64 (BP Location: Left Arm, Patient Position: Sitting, Cuff Size: Large)   Pulse 92   Temp 97.4 F (36.3 C) (Oral)   Ht 6' (1.829 m)   Wt 197 lb 9.6 oz (89.6 kg)   SpO2 96%   BMI 26.80 kg/m  Gen: NAD, resting comfortably CV: RRR no murmurs rubs or gallops Lungs: CTAB no crackles, wheeze, rhonchi Abdomen: soft/nontender/nondistended/normal bowel sounds. No rebound or guarding.  Ext: no edema Skin: warm, dry Neuro: grossly normal, moves all extremities  Diabetic Foot Exam - Simple   Simple Foot Form Diabetic Foot exam  was performed with the following findings:  Yes 09/19/2016 11:57 AM  Visual Inspection See comments:  Yes Sensation Testing See comments:  Yes Pulse Check Posterior Tibialis and Dorsalis pulse intact bilaterally:  Yes Comments 1+ pulses, does not feel monofilament on bilateral feet, ulcer right foot- followed by podiatry    Assessment/Plan:  DM (diabetes mellitus) type II controlled, neurological manifestation ([name]) S: well controlled On metformin 500mg  alone. Following with podiatry for diabetic ulcer.  CBGs- 150s in the morning still and at night.  Exercise and diet- poor exercise due to back, motivation low on food changes- mild weight gain Lab Results  Component Value Date   HGBA1C 7.0 (H) 03/22/2016   HGBA1C 7.4 (H) 12/01/2015   HGBA1C 6.9 (H) 05/06/2015   A/P: Encouraged need for healthy eating, regular exercise, weight loss. Exercise will be tough. Continue metformin as long as a1c under 7.5. Neuropathy noted and likely contributes to ulcer- continue podiatry care  Chronic back pain. Full history 06/12/14. Pain contract signed.  S: stable chronic back pain. Taking percocet 3x a day keeps pain at 7/10 and allows him to be active at home. Slightly worse- sometimes an 8- some pain into left leg. Uses a cane until he takes his medicine. Prior injection in the back helped.  Left knee also bothering him some- injections in the past.  A/P: 3 months of refills provided today- patient aware we will not escalate dose. Did UDS in June and has pain contract signed. Discussed pain management option- he will consider for next visit given worsening. Also wants to consider injection of knees for arthritis has helped in past through ortho  Essential hypertension S: controlled on amlodipine 10mg .  BP Readings from Last 3 Encounters:  09/19/16 118/64  07/15/16 (!) 145/82  06/22/16 130/70  A/P:Continue current meds:  Much better control this visit  Diabetic polyneuropathy associated with type 2  diabetes mellitus ([name]) Ulcer right foot improving- continue podiatry follow up- really appreciate their help  3 months  Orders Placed This Encounter  Procedures  . CBC    [place]  . Comprehensive metabolic panel    [place]  . Hemoglobin A1c    [place]    Meds ordered this encounter  Medications  . oxyCODONE-acetaminophen (PERCOCET) 7.5-325 MG tablet    Sig: Take 1 tablet by mouth every 6 (six) hours as needed for severe pain (3x a day max. May fill 09/21/16.).    Dispense:  90 tablet    Refill:  0  . oxyCODONE-acetaminophen (PERCOCET) 7.5-325 MG tablet    Sig: Take 1 tablet by mouth every 6 (six) hours as needed for severe pain (3x a day max. May fill 10/22/16.).    Dispense:  90 tablet    Refill:  0  . oxyCODONE-acetaminophen (PERCOCET) 7.5-325 MG tablet  Sig: Take 1 tablet by mouth every 6 (six) hours as needed for severe pain. 3x a day max. May fill 11/22/16.    Dispense:  90 tablet    Refill:  0   Return precautions advised.  [name], MD

## 2016-09-19 NOTE — Assessment & Plan Note (Signed)
S: stable chronic back pain. Taking percocet 3x a day keeps pain at 7/10 and allows him to be active at home. Slightly worse- sometimes an 8- some pain into left leg. Uses a cane until he takes his medicine. Prior injection in the back helped.  Left knee also bothering him some- injections in the past.  A/P: 3 months of refills provided today- patient aware we will not escalate dose. Did UDS in June and has pain contract signed. Discussed pain management option- he will consider for next visit given worsening. Also wants to consider injection of knees for arthritis has helped in past through ortho

## 2016-09-19 NOTE — Assessment & Plan Note (Signed)
Ulcer right foot improving- continue podiatry follow up- really appreciate their help

## 2016-09-19 NOTE — Progress Notes (Signed)
Pre visit review using our clinic review tool, if applicable. No additional management support is needed unless otherwise documented below in the visit note. 

## 2016-09-19 NOTE — Assessment & Plan Note (Signed)
S: well controlled On metformin 500mg  alone. Following with podiatry for diabetic ulcer.  CBGs- 150s in the morning still and at night.  Exercise and diet- poor exercise due to back, motivation low on food changes- mild weight gain Lab Results  Component Value Date   HGBA1C 7.0 (H) 03/22/2016   HGBA1C 7.4 (H) 12/01/2015   HGBA1C 6.9 (H) 05/06/2015   A/P: Encouraged need for healthy eating, regular exercise, weight loss. Exercise will be tough. Continue metformin as long as a1c under 7.5. Neuropathy noted and likely contributes to ulcer- continue podiatry care

## 2016-09-25 NOTE — Progress Notes (Signed)
Patient ID: [name], male   DOB: 16-Dec-1940, 75 y.o.   MRN: [id number]  Subjective: 75 year old male presents the office today follow-up evaluation of ulceration to the right foot. He states that it is "looking better". He denies any swelling or redness to the area. The areas not been painful. He has been continuing to regular shoe but he does continue with offloading pads to the area as well. Denies any systemic complaints such as fevers, chills, nausea, vomiting. No acute changes since last appointment, and no other complaints at this time.   Objective: AAO x3, NAD DP/PT pulses palpable bilaterally, CRT less than 3 seconds Protective sensation absent with [name] monofilament Thick, hyperkeratotic lesion right foot submetatarsal one. Upon debridement there is no underlying ulceration, drainage or signs of infection the areas pre-ulcerative however. There is prominent metatarsal heads plantarly with atrophy of the fat pad. There is no other open lesions or pre-ulcerative lesions identified at today's visit. No pain with calf compression, swelling, warmth, erythema  Assessment: Right submetatarsal 1 pre-ulcerative lesion  Plan: -All treatment options discussed with the patient including all alternatives, risks, complications.  -Hyperkeratotic lesion was debrided without complications or bleeding. Offloading pads were dispensed. Monitor for any skin breakdown.  Discussed possible diabetic shoes/insert to help take pressure off the pre-ulcerative callus to the history wound. He also has neuropathy. I believe he benefit from diabetic shoes. Paperwork was completed today for precertification.- -Follow up in 9 weeks or sooner if needed. Call any questions concerns the meantime.  Celesta Gentile, DPM

## 2016-10-19 DIAGNOSIS — Z947 Corneal transplant status: Secondary | ICD-10-CM | POA: Diagnosis not present

## 2016-10-19 DIAGNOSIS — H532 Diplopia: Secondary | ICD-10-CM | POA: Diagnosis not present

## 2016-10-19 DIAGNOSIS — Z961 Presence of intraocular lens: Secondary | ICD-10-CM | POA: Diagnosis not present

## 2016-11-21 ENCOUNTER — Encounter: Payer: Self-pay | Admitting: Podiatry

## 2016-11-21 ENCOUNTER — Ambulatory Visit (INDEPENDENT_AMBULATORY_CARE_PROVIDER_SITE_OTHER): Payer: Medicare HMO | Admitting: Podiatry

## 2016-11-21 DIAGNOSIS — L84 Corns and callosities: Secondary | ICD-10-CM

## 2016-11-21 DIAGNOSIS — E1149 Type 2 diabetes mellitus with other diabetic neurological complication: Secondary | ICD-10-CM

## 2016-11-23 ENCOUNTER — Other Ambulatory Visit: Payer: Self-pay | Admitting: Family Medicine

## 2016-11-23 NOTE — Progress Notes (Signed)
Patient ID: [name], male   DOB: [birth year], 76 y.o.   MRN: [id number]  Subjective: 76 year old male presents the office today follow-up evaluation of a pre-ulceration callus to the right foot. She states that the areas doing well and is not painful. He has not noticed any swelling or redness or any drainage. Denies any pain. Denies any systemic complaints such as fevers, chills, nausea, vomiting. No acute changes since last appointment, and no other complaints at this time.   Objective: AAO x3, NAD DP/PT pulses palpable bilaterally, CRT less than 3 seconds Protective sensation absent with [name] monofilament Hyperkeratotic lesion right foot submetatarsal one. Upon debridement there is no underlying ulceration, drainage or signs of infection. There is prominent metatarsal heads plantarly with atrophy of the fat pad. There is no other open lesions or pre-ulcerative lesions identified at today's visit. No pain with calf compression, swelling, warmth, erythema  Assessment: Right submetatarsal 1 pre-ulcerative lesion  Plan: -All treatment options discussed with the patient including all alternatives, risks, complications.  -Hyperkeratotic lesion was debrided without complications or bleeding. Offloading pads were dispensed. Monitor for any skin breakdown.  -Awaiting diabetic shoes.  -Follow up in 3 months or sooner if needed. Call any questions concerns the meantime.  Celesta Gentile, DPM

## 2016-12-01 ENCOUNTER — Telehealth: Payer: Self-pay | Admitting: *Deleted

## 2016-12-01 NOTE — Telephone Encounter (Signed)
Patient was approved for diabetic shoes and gave paper work to [employer] to schedule for measurement. Lattie Haw

## 2016-12-13 ENCOUNTER — Ambulatory Visit: Payer: Medicare HMO | Admitting: *Deleted

## 2016-12-13 DIAGNOSIS — E1149 Type 2 diabetes mellitus with other diabetic neurological complication: Secondary | ICD-10-CM

## 2016-12-13 NOTE — Progress Notes (Signed)
Patient ID: [name], male   DOB: [birth year], 76 y.o.   MRN: [id number]   Patient presents to be measured and molded for diabetic shoes and inserts. [place] [address] 11 extra wide

## 2016-12-19 ENCOUNTER — Ambulatory Visit: Payer: Commercial Managed Care - HMO | Admitting: Family Medicine

## 2016-12-21 ENCOUNTER — Encounter: Payer: Self-pay | Admitting: Family Medicine

## 2016-12-21 ENCOUNTER — Ambulatory Visit (INDEPENDENT_AMBULATORY_CARE_PROVIDER_SITE_OTHER): Payer: Medicare HMO | Admitting: Family Medicine

## 2016-12-21 VITALS — BP 132/70 | HR 103 | Temp 98.0°F | Ht 72.0 in | Wt 199.4 lb

## 2016-12-21 DIAGNOSIS — M170 Bilateral primary osteoarthritis of knee: Secondary | ICD-10-CM

## 2016-12-21 DIAGNOSIS — G8929 Other chronic pain: Secondary | ICD-10-CM

## 2016-12-21 DIAGNOSIS — L97511 Non-pressure chronic ulcer of other part of right foot limited to breakdown of skin: Secondary | ICD-10-CM

## 2016-12-21 DIAGNOSIS — J449 Chronic obstructive pulmonary disease, unspecified: Secondary | ICD-10-CM | POA: Diagnosis not present

## 2016-12-21 DIAGNOSIS — E875 Hyperkalemia: Secondary | ICD-10-CM | POA: Diagnosis not present

## 2016-12-21 DIAGNOSIS — D696 Thrombocytopenia, unspecified: Secondary | ICD-10-CM

## 2016-12-21 DIAGNOSIS — M545 Low back pain: Secondary | ICD-10-CM | POA: Diagnosis not present

## 2016-12-21 DIAGNOSIS — E1142 Type 2 diabetes mellitus with diabetic polyneuropathy: Secondary | ICD-10-CM | POA: Diagnosis not present

## 2016-12-21 DIAGNOSIS — I10 Essential (primary) hypertension: Secondary | ICD-10-CM | POA: Diagnosis not present

## 2016-12-21 LAB — CBC WITH DIFFERENTIAL/PLATELET
BASOS ABS: 0.1 10*3/uL (ref 0.0–0.1)
Basophils Relative: 0.7 % (ref 0.0–3.0)
EOS ABS: 0.1 10*3/uL (ref 0.0–0.7)
Eosinophils Relative: 1.6 % (ref 0.0–5.0)
HCT: 36.2 % — ABNORMAL LOW (ref 39.0–52.0)
Hemoglobin: 12.4 g/dL — ABNORMAL LOW (ref 13.0–17.0)
LYMPHS ABS: 2.1 10*3/uL (ref 0.7–4.0)
LYMPHS PCT: 30 % (ref 12.0–46.0)
MCHC: 34.2 g/dL (ref 30.0–36.0)
MCV: 95.6 fl (ref 78.0–100.0)
Monocytes Absolute: 0.4 10*3/uL (ref 0.1–1.0)
Monocytes Relative: 5.8 % (ref 3.0–12.0)
NEUTROS ABS: 4.2 10*3/uL (ref 1.4–7.7)
NEUTROS PCT: 61.9 % (ref 43.0–77.0)
PLATELETS: 134 10*3/uL — AB (ref 150.0–400.0)
RBC: 3.78 Mil/uL — ABNORMAL LOW (ref 4.22–5.81)
RDW: 14.2 % (ref 11.5–15.5)
WBC: 6.8 10*3/uL (ref 4.0–10.5)

## 2016-12-21 LAB — COMPREHENSIVE METABOLIC PANEL
ALT: 16 U/L (ref 0–53)
AST: 23 U/L (ref 0–37)
Albumin: 4.1 g/dL (ref 3.5–5.2)
Alkaline Phosphatase: 106 U/L (ref 39–117)
BILIRUBIN TOTAL: 0.4 mg/dL (ref 0.2–1.2)
BUN: 13 mg/dL (ref 6–23)
CO2: 30 meq/L (ref 19–32)
CREATININE: 1.11 mg/dL (ref 0.40–1.50)
Calcium: 9.6 mg/dL (ref 8.4–10.5)
Chloride: 104 mEq/L (ref 96–112)
GFR: 68.47 mL/min (ref >60.00)
GLUCOSE: 296 mg/dL — AB (ref 70–99)
Potassium: 5.3 mEq/L — ABNORMAL HIGH (ref 3.5–5.1)
Sodium: 141 mEq/L (ref 135–145)
Total Protein: 6.7 g/dL (ref 6.0–8.3)

## 2016-12-21 LAB — HEMOGLOBIN A1C: Hgb A1c MFr Bld: 7.5 % — ABNORMAL HIGH (ref 4.6–6.5)

## 2016-12-21 MED ORDER — OXYCODONE-ACETAMINOPHEN 7.5-325 MG PO TABS: 1.0000 | ORAL_TABLET | Freq: Four times a day (QID) | ORAL | 0 refills | Status: DC | PRN

## 2016-12-21 MED ORDER — ALBUTEROL SULFATE HFA 108 (90 BASE) MCG/ACT IN AERS: 2.0000 | INHALATION_SPRAY | Freq: Four times a day (QID) | RESPIRATORY_TRACT | 5 refills | Status: DC | PRN

## 2016-12-21 NOTE — Assessment & Plan Note (Signed)
S: controlled on amlodipine 10mg .  BP Readings from Last 3 Encounters:  12/21/16 132/70  09/19/16 118/64  07/15/16 (!) 145/82  A/P:Continue current meds doing well on repeat

## 2016-12-21 NOTE — Assessment & Plan Note (Addendum)
S: previously told me injections only lasted 2-3 months. Today he states lasted 6 months. He would like to retrial. Knee pain has gone up to 8/10. He has been told in past needed replacement but he does not want to do that.  A/P: agreed to one more trial of injections but if lasts under 3 months would not repeat and consider viscous supplementation with Dr. Paulla Fore. He will return for bilateral knee injections as long as a1c is under 7- if not under 7- will need to work on weight loss/healthy eating and med adjustments before providing injection

## 2016-12-21 NOTE — Assessment & Plan Note (Signed)
S: very mild anemia and thrombocytopenia - both stable on last labs A/P: repeat cbc today

## 2016-12-21 NOTE — Progress Notes (Signed)
Subjective:  [name] is a 76 y.o. year old very pleasant male patient who presents for/with See problem oriented charting ROS- complains of knee and low back pain. No chest pain. Does have some SOb and wheeze at times.    Past Medical History-  Patient Active Problem List   Diagnosis Date Noted  . Thrombocytopenia ([place]) 12/21/2015    Priority: High  . B12 deficiency 08/20/2015    Priority: High  . 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 CKD (chronic kidney disease), stage III 05/06/2015    Priority: High  . ILD (interstitial lung disease) ([place]) 07/05/2012    Priority: High  . COPD (chronic obstructive pulmonary disease) ([place]) 12/15/2009    Priority: High  . Chronic back pain. Full history 06/12/14. Pain contract signed.  05/02/2007    Priority: High  . DM (diabetes mellitus) type II controlled, neurological manifestation ([place]) 04/06/2007    Priority: High  . Right foot ulcer ([place]) 10/09/2015    Priority: Medium  . Diabetic polyneuropathy associated with type 2 diabetes mellitus ([place]) 08/07/2015    Priority: Medium  . Fatty liver 11/04/2014    Priority: Medium  . BPH (benign prostatic hyperplasia) 06/20/2007    Priority: Medium  . Depression 05/02/2007    Priority: Medium  . Hyperlipidemia 04/06/2007    Priority: Medium  . Essential hypertension 04/06/2007    Priority: Medium  . Osteoarthritis 04/06/2007    Priority: Medium  . 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 positive 03/29/2016    Medications- reviewed and updated Current Outpatient Prescriptions  Medication Sig Dispense Refill  . acyclovir (ZOVIRAX) 400 MG tablet TAKE 1 TABLET TWICE DAILY 180 tablet 2  . amitriptyline (ELAVIL) 50 MG tablet TAKE 1 TABLET AT BEDTIME 90 tablet 3  . amLODipine (NORVASC) 10 MG tablet TAKE 1 TABLET DAILY. 90 tablet 3  . aspirin 81 MG tablet Take 81 mg by mouth daily.      . Cholecalciferol 1000 UNITS capsule Take 1 capsule  (1,000 Units total) by mouth 2 (two) times daily.    . famotidine (PEPCID) 20 MG tablet TAKE 1 TABLET AT BEDTIME AS NEEDED FOR  HEARTBURN 90 tablet 3  . finasteride (PROSCAR) 5 MG tablet TAKE 1 TABLET EVERY DAY 90 tablet 3  . gabapentin (NEURONTIN) 300 MG capsule TAKE 1 CAPSULE TWICE DAILY 180 capsule 1  . metFORMIN (GLUCOPHAGE) 500 MG tablet TAKE 1 TABLET EVERY DAY WITH BREAKFAST 90 tablet 3  . Multiple Vitamin (MULTIVITAMIN) capsule Take 1 capsule by mouth daily.      Marland Kitchen omeprazole (PRILOSEC) 20 MG capsule TAKE 1 CAPSULE DAILY 30 TO 60 MINUTES BEFORE FIRST MEAL OF THE DAY 90 capsule 3  . oxyCODONE-acetaminophen (PERCOCET) 7.5-325 MG tablet Take 1 tablet by mouth every 6 (six) hours as needed for severe pain (3x a day max. May fill 12/21/16). 90 tablet 0  . oxyCODONE-acetaminophen (PERCOCET) 7.5-325 MG tablet Take 1 tablet by mouth every 6 (six) hours as needed for severe pain (3x a day max. May fill 01/21/17). 90 tablet 0  . oxyCODONE-acetaminophen (PERCOCET) 7.5-325 MG tablet Take 1 tablet by mouth every 6 (six) hours as needed for severe pain (3x a day max. May fill 02/20/17). 90 tablet 0  . rosuvastatin (CRESTOR) 10 MG tablet TAKE 1 TABLET EVERY DAY 90 tablet 3  . tamsulosin (FLOMAX) 0.4 MG CAPS capsule TAKE 1 CAPSULE EVERY DAY 90 capsule 1  . TRUEPLUS LANCETS  28G MISC Use to test blood sugars two times daily. Dx: E11.9 200 each 3  . albuterol (PROVENTIL HFA;VENTOLIN HFA) 108 (90 Base) MCG/ACT inhaler Inhale 2 puffs into the lungs every 6 (six) hours as needed for wheezing or shortness of breath. 1 Inhaler 5   No current facility-administered medications for this visit.     Objective: BP 132/70 (BP Location: Left Arm, Patient Position: Sitting, Cuff Size: Large)   Pulse (!) 103   Temp 98 F (36.7 C) (Oral)   Ht 6' (1.829 m)   Wt 199 lb 6.4 oz (90.4 kg)   SpO2 95%   BMI 27.04 kg/m  Gen: NAD, resting comfortably CV: RRR no murmurs rubs or gallops Lungs: CTAB no crackles, wheeze,  rhonchi Ext: no edema Skin: warm, dry Neuro: appears uncomfortable with walking, slow gait. Appears to be in pain trying to get out of chair  Assessment/Plan:  Right foot ulcer ([place]) Much improved- now moved to 3 month follow up with podiatry.   DM (diabetes mellitus) type II controlled, neurological manifestation ([place]) S:  controlled. On last check with metformin 500mg  alone CBGs- stable around 140 or 150 Exercise and diet- low exercise due to back, encouragedhealthy eating but low motivation. Weight continues to creep up.  Lab Results  Component Value Date   HGBA1C 6.9 (H) 09/19/2016   HGBA1C 7.0 (H) 03/22/2016   HGBA1C 7.4 (H) 12/01/2015   A/P: get a1c- if over 7- would likely increase metformin at least to 500mg  BID.   Thrombocytopenia ([place]) S: very mild anemia and thrombocytopenia - both stable on last labs A/P: repeat cbc today  Essential hypertension S: controlled on amlodipine 10mg .  BP Readings from Last 3 Encounters:  12/21/16 132/70  09/19/16 118/64  07/15/16 (!) 145/82  A/P:Continue current meds doing well on repeat  COPD (chronic obstructive pulmonary disease) ([facility]) S: little activity and as a result does not have much SOB. If gets really busy/active states does get winded and have slight wheeze A/P: discussed having albuterol on hand- he agrees. Will give albuterol inhaler  Chronic back pain. Full history 06/12/14. Pain contract signed.  S: reviewed [place] and getting #90 percocet 7.5/325 every 30 days. He continues to state allows him to be active and keeps pain around 7/10 for most part. Does get pain into left leg still.  A/P: feels like knees are the worst part recently though- will refill medication   Osteoarthritis S: previously told me injections only lasted 2-3 months. Today he states lasted 6 months. He would like to retrial. Knee pain has gone up to 8/10. He has been told in past needed replacement but he does not want to do that.  A/P: agreed to one  more trial of injections but if lasts under 3 months would not repeat and consider viscous supplementation with Dr. Paulla Fore. He will return for bilateral knee injections as long as a1c is under 7- if not under 7- will need to work on weight loss/healthy eating and med adjustments before providing injection  Return in 14 weeks (on 03/29/2017). Asked him to sign roi for eye exam- states has had within a year  Orders Placed This Encounter  Procedures  . CBC with Differential/Platelet  . Comprehensive metabolic panel    [place]  . Hemoglobin A1c    [place]    Meds ordered this encounter  Medications  . oxyCODONE-acetaminophen (PERCOCET) 7.5-325 MG tablet    Sig: Take 1 tablet by mouth every 6 (six) hours as needed for  severe pain (3x a day max. May fill 12/21/16).    Dispense:  90 tablet    Refill:  0  . oxyCODONE-acetaminophen (PERCOCET) 7.5-325 MG tablet    Sig: Take 1 tablet by mouth every 6 (six) hours as needed for severe pain (3x a day max. May fill 01/21/17).    Dispense:  90 tablet    Refill:  0  . oxyCODONE-acetaminophen (PERCOCET) 7.5-325 MG tablet    Sig: Take 1 tablet by mouth every 6 (six) hours as needed for severe pain (3x a day max. May fill 02/20/17).    Dispense:  90 tablet    Refill:  0  . albuterol (PROVENTIL HFA;VENTOLIN HFA) 108 (90 Base) MCG/ACT inhaler    Sig: Inhale 2 puffs into the lungs every 6 (six) hours as needed for wheezing or shortness of breath.    Dispense:  1 Inhaler    Refill:  5    Return precautions advised.  [name], MD

## 2016-12-21 NOTE — Assessment & Plan Note (Signed)
S:  controlled. On last check with metformin 500mg  alone CBGs- stable around 140 or 150 Exercise and diet- low exercise due to back, encouragedhealthy eating but low motivation. Weight continues to creep up.  Lab Results  Component Value Date   HGBA1C 6.9 (H) 09/19/2016   HGBA1C 7.0 (H) 03/22/2016   HGBA1C 7.4 (H) 12/01/2015   A/P: get a1c- if over 7- would likely increase metformin at least to 500mg  BID.

## 2016-12-21 NOTE — Assessment & Plan Note (Signed)
Much improved- now moved to 3 month follow up with podiatry.

## 2016-12-21 NOTE — Patient Instructions (Addendum)
He will return for bilateral knee injections as long as a1c is under 7 (we will call about a1c)- if not under 7- will need to work on weight loss/healthy eating and medication adjustments before providing injection as do not want to run his sugars up.   Discussed need to keep weight under 200 as evidence of activity being up with pain control Wt Readings from Last 3 Encounters:  12/21/16 199 lb 6.4 oz (90.4 kg)  09/19/16 197 lb 9.6 oz (89.6 kg)  06/22/16 194 lb 9.6 oz (88.3 kg)   congrats on the foot doing better

## 2016-12-21 NOTE — Assessment & Plan Note (Signed)
S: reviewed [place] and getting #90 percocet 7.5/325 every 30 days. He continues to state allows him to be active and keeps pain around 7/10 for most part. Does get pain into left leg still.  A/P: feels like knees are the worst part recently though- will refill medication

## 2016-12-21 NOTE — Progress Notes (Signed)
Pre visit review using our clinic review tool, if applicable. No additional management support is needed unless otherwise documented below in the visit note. 

## 2016-12-21 NOTE — Assessment & Plan Note (Signed)
S: little activity and as a result does not have much SOB. If gets really busy/active states does get winded and have slight wheeze A/P: discussed having albuterol on hand- he agrees. Will give albuterol inhaler

## 2016-12-27 MED ORDER — METFORMIN HCL 500 MG PO TABS: 500.0000 mg | ORAL_TABLET | Freq: Two times a day (BID) | ORAL | 3 refills | Status: DC

## 2016-12-27 NOTE — Addendum Note (Signed)
Addended by: Abelardo Diesel on: 12/27/2016 03:25 PM   Modules accepted: Orders

## 2017-01-12 ENCOUNTER — Other Ambulatory Visit: Payer: Medicare HMO

## 2017-01-18 ENCOUNTER — Other Ambulatory Visit: Payer: Medicare HMO

## 2017-01-18 DIAGNOSIS — L89899 Pressure ulcer of other site, unspecified stage: Secondary | ICD-10-CM | POA: Diagnosis not present

## 2017-01-18 DIAGNOSIS — E1140 Type 2 diabetes mellitus with diabetic neuropathy, unspecified: Secondary | ICD-10-CM | POA: Diagnosis not present

## 2017-01-18 DIAGNOSIS — L84 Corns and callosities: Secondary | ICD-10-CM | POA: Diagnosis not present

## 2017-02-20 ENCOUNTER — Encounter: Payer: Self-pay | Admitting: Podiatry

## 2017-02-20 ENCOUNTER — Ambulatory Visit (INDEPENDENT_AMBULATORY_CARE_PROVIDER_SITE_OTHER): Payer: Medicare HMO | Admitting: Podiatry

## 2017-02-20 DIAGNOSIS — E1149 Type 2 diabetes mellitus with other diabetic neurological complication: Secondary | ICD-10-CM | POA: Diagnosis not present

## 2017-02-20 DIAGNOSIS — L84 Corns and callosities: Secondary | ICD-10-CM

## 2017-02-20 NOTE — Progress Notes (Signed)
Patient ID: [name], male   DOB: [birth year], 76 y.o.   MRN: [id number]  Subjective: 76 year old male presents the office today follow-up evaluation of a pre-ulceration callus to the right foot. He states this area is doing better but when he does do a lateral walking or standing he tends to get some pain around the edges. Denies any drainage or pus.  Denies any systemic complaints such as fevers, chills, nausea, vomiting. No acute changes since last appointment, and no other complaints at this time.   Objective: AAO x3, NAD DP/PT pulses palpable bilaterally, CRT less than 3 seconds Protective sensation absent with [name] monofilament Hyperkeratotic lesion right foot submetatarsal one. Upon debridement there is no underlying ulceration, drainage or signs of infection. There is prominent metatarsal heads plantarly with atrophy of the fat pad and there is plantarflexion of the first ray. There is no other open lesions or pre-ulcerative lesions identified at today's visit. No pain with calf compression, swelling, warmth, erythema  Assessment: Right submetatarsal 1 pre-ulcerative lesion  Plan: -All treatment options discussed with the patient including all alternatives, risks, complications.  -Hyperkeratotic lesion was debrided without complications or bleeding. Offloading pads were dispensed. Monitor for any skin breakdown.  -Offloading pads were dispensed. -Continue diabetic shoes.  -Follow up in 3 months or sooner if needed. Call any questions concerns the meantime.  Celesta Gentile, DPM

## 2017-03-30 ENCOUNTER — Ambulatory Visit (INDEPENDENT_AMBULATORY_CARE_PROVIDER_SITE_OTHER): Payer: Medicare HMO | Admitting: Family Medicine

## 2017-03-30 ENCOUNTER — Encounter: Payer: Self-pay | Admitting: Family Medicine

## 2017-03-30 VITALS — BP 120/66 | HR 97 | Temp 98.6°F | Ht 72.0 in | Wt 197.0 lb

## 2017-03-30 DIAGNOSIS — G8929 Other chronic pain: Secondary | ICD-10-CM

## 2017-03-30 DIAGNOSIS — R319 Hematuria, unspecified: Secondary | ICD-10-CM | POA: Diagnosis not present

## 2017-03-30 DIAGNOSIS — E1142 Type 2 diabetes mellitus with diabetic polyneuropathy: Secondary | ICD-10-CM | POA: Diagnosis not present

## 2017-03-30 DIAGNOSIS — M545 Low back pain, unspecified: Secondary | ICD-10-CM

## 2017-03-30 DIAGNOSIS — E785 Hyperlipidemia, unspecified: Secondary | ICD-10-CM | POA: Diagnosis not present

## 2017-03-30 DIAGNOSIS — M170 Bilateral primary osteoarthritis of knee: Secondary | ICD-10-CM | POA: Diagnosis not present

## 2017-03-30 DIAGNOSIS — I10 Essential (primary) hypertension: Secondary | ICD-10-CM | POA: Diagnosis not present

## 2017-03-30 LAB — COMPREHENSIVE METABOLIC PANEL
ALBUMIN: 4.1 g/dL (ref 3.5–5.2)
ALT: 11 U/L (ref 0–53)
AST: 15 U/L (ref 0–37)
Alkaline Phosphatase: 105 U/L (ref 39–117)
BUN: 22 mg/dL (ref 6–23)
CALCIUM: 9.5 mg/dL (ref 8.4–10.5)
CO2: 31 mEq/L (ref 19–32)
Chloride: 103 mEq/L (ref 96–112)
Creatinine, Ser: 1.41 mg/dL (ref 0.40–1.50)
GFR: 51.92 mL/min — AB (ref >60.00)
Glucose, Bld: 164 mg/dL — ABNORMAL HIGH (ref 70–99)
POTASSIUM: 5.5 meq/L — AB (ref 3.5–5.1)
Sodium: 140 mEq/L (ref 135–145)
Total Bilirubin: 0.4 mg/dL (ref 0.2–1.2)
Total Protein: 6.5 g/dL (ref 6.0–8.3)

## 2017-03-30 LAB — CBC
HEMATOCRIT: 36.7 % — AB (ref 39.0–52.0)
HEMOGLOBIN: 12.5 g/dL — AB (ref 13.0–17.0)
MCHC: 34.1 g/dL (ref 30.0–36.0)
MCV: 95.9 fl (ref 78.0–100.0)
Platelets: 165 10*3/uL (ref 150.0–400.0)
RBC: 3.83 Mil/uL — AB (ref 4.22–5.81)
RDW: 14.4 % (ref 11.5–15.5)
WBC: 9.5 10*3/uL (ref 4.0–10.5)

## 2017-03-30 LAB — POC URINALSYSI DIPSTICK (AUTOMATED)
BILIRUBIN UA: NEGATIVE
Glucose, UA: NEGATIVE
KETONES UA: NEGATIVE
Nitrite, UA: NEGATIVE
Urobilinogen, UA: 0.2 E.U./dL
pH, UA: 6 (ref 5.0–8.0)

## 2017-03-30 LAB — HEMOGLOBIN A1C: HEMOGLOBIN A1C: 7.1 % — AB (ref 4.6–6.5)

## 2017-03-30 LAB — LDL CHOLESTEROL, DIRECT: LDL DIRECT: 58 mg/dL

## 2017-03-30 MED ORDER — OXYCODONE-ACETAMINOPHEN 7.5-325 MG PO TABS: 1.0000 | ORAL_TABLET | Freq: Four times a day (QID) | ORAL | 0 refills | Status: DC | PRN

## 2017-03-30 NOTE — Assessment & Plan Note (Signed)
S:  controlled on crestor 10mg . No myalgias.  A/P: update LDL today

## 2017-03-30 NOTE — Progress Notes (Addendum)
Subjective:  [name] is a 76 y.o. year old very pleasant male patient who presents for/with See problem oriented charting ROS- chronic knee and back pain. No chest pain. No worsening edema   Past Medical History-  Patient Active Problem List   Diagnosis Date Noted  . Thrombocytopenia ([place]) 12/21/2015    Priority: High  . B12 deficiency 08/20/2015    Priority: High  . 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 CKD (chronic kidney disease), stage III 05/06/2015    Priority: High  . ILD (interstitial lung disease) ([place]) 07/05/2012    Priority: High  . COPD (chronic obstructive pulmonary disease) ([place]) 12/15/2009    Priority: High  . Chronic back pain. Full history 06/12/14. Pain contract signed.  05/02/2007    Priority: High  . DM (diabetes mellitus) type II controlled, neurological manifestation ([place]) 04/06/2007    Priority: High  . Right foot ulcer ([place]) 10/09/2015    Priority: Medium  . Diabetic polyneuropathy associated with type 2 diabetes mellitus ([place]) 08/07/2015    Priority: Medium  . Fatty liver 11/04/2014    Priority: Medium  . BPH (benign prostatic hyperplasia) 06/20/2007    Priority: Medium  . Depression 05/02/2007    Priority: Medium  . Hyperlipidemia 04/06/2007    Priority: Medium  . Essential hypertension 04/06/2007    Priority: Medium  . Osteoarthritis 04/06/2007    Priority: Medium  . 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 positive 03/29/2016    Medications- reviewed and updated Current Outpatient Prescriptions  Medication Sig Dispense Refill  . acyclovir (ZOVIRAX) 400 MG tablet TAKE 1 TABLET TWICE DAILY 180 tablet 2  . albuterol (PROVENTIL HFA;VENTOLIN HFA) 108 (90 Base) MCG/ACT inhaler Inhale 2 puffs into the lungs every 6 (six) hours as needed for wheezing or shortness of breath. 1 Inhaler 5  . amitriptyline (ELAVIL) 50 MG tablet TAKE 1 TABLET AT BEDTIME 90 tablet 3  . amLODipine (NORVASC) 10 MG  tablet TAKE 1 TABLET DAILY. 90 tablet 3  . aspirin 81 MG tablet Take 81 mg by mouth daily.      . Cholecalciferol 1000 UNITS capsule Take 1 capsule (1,000 Units total) by mouth 2 (two) times daily.    . famotidine (PEPCID) 20 MG tablet TAKE 1 TABLET AT BEDTIME AS NEEDED FOR  HEARTBURN 90 tablet 3  . finasteride (PROSCAR) 5 MG tablet TAKE 1 TABLET EVERY DAY 90 tablet 3  . gabapentin (NEURONTIN) 300 MG capsule TAKE 1 CAPSULE TWICE DAILY 180 capsule 1  . metFORMIN (GLUCOPHAGE) 500 MG tablet Take 1 tablet (500 mg total) by mouth 2 (two) times daily with a meal. 60 tablet 3  . Multiple Vitamin (MULTIVITAMIN) capsule Take 1 capsule by mouth daily.      . NONFORMULARY OR COMPOUNDED ITEM [facility]  Peripheral Neuropathy Cream- Bupivacaine 1%, Doxepin 3%, Gabapentin 6%, Pentoxifylline 3%, Topiramate 1% Apply 1-2 grams to affected area 3-4 times daily Qty. 120 gm 3 refills    . omeprazole (PRILOSEC) 20 MG capsule TAKE 1 CAPSULE DAILY 30 TO 60 MINUTES BEFORE FIRST MEAL OF THE DAY 90 capsule 3  . oxyCODONE-acetaminophen (PERCOCET) 7.5-325 MG tablet Take 1 tablet by mouth every 6 (six) hours as needed for severe pain (3x a day max. May fill 12/21/16). 90 tablet 0  . rosuvastatin (CRESTOR) 10 MG tablet TAKE 1 TABLET EVERY DAY 90 tablet 3  . tamsulosin (FLOMAX) 0.4 MG CAPS capsule TAKE 1 CAPSULE EVERY  DAY 90 capsule 1  . TRUEPLUS LANCETS 28G MISC Use to test blood sugars two times daily. Dx: E11.9 200 each 3   No current facility-administered medications for this visit.     Objective: BP 120/66 (BP Location: Left Arm, Patient Position: Sitting, Cuff Size: Large)   Pulse 97   Temp 98.6 F (37 C) (Oral)   Ht 6' (1.829 m)   Wt 197 lb (89.4 kg)   SpO2 95%   BMI 26.72 kg/m  Gen: NAD, resting comfortably CV: RRR no murmurs rubs or gallops Lungs: CTAB no crackles, wheeze, rhonchi Abdomen: soft/nontender/nondistended/normal bowel sounds.  Ext: trace edema Skin: warm, dry Neuro: antalgic  gait  Assessment/Plan:  Essential hypertension S: controlled on amlodipine 10mg .  ASCVD 10 year risk calculation if age 13-79: on crestor 10mg  BP Readings from Last 3 Encounters:  03/30/17 120/66  12/21/16 132/70  09/19/16 118/64  A/P: We discussed blood pressure goal of <140/90. Continue current meds.   Hyperlipidemia S:  controlled on crestor 10mg . No myalgias.  A/P: update LDL today  Osteoarthritis S: chronic pain in knees and low back. Has been told needed replacement in the past but has not want surgery. Percocet 7.5/324 #90 every 30 days. Injection into knees has lasted very short term. Last visit he reported prior had helped 6 months when previously had helped 2- he really wants anything that will help his knees feel better even despite the percocet. Pain in knees about 8/10 at its worst but percocet helps A/P: Refer to ortho for consideration of repeat steroid injections under their care vs. Viscous supplementation. Do not believe patient is open to surgery. This referral is NOT to request they take over his chronic pain medications.   Chronic back pain. Full history 06/12/14. Pain contract signed.  Refills provided and UDS today  DM (diabetes mellitus) type II controlled, neurological manifestation ([place]) S: hopefully controlled. On metformin 500mg  now BID  Lab Results  Component Value Date   HGBA1C 7.5 (H) 12/21/2016   HGBA1C 6.9 (H) 09/19/2016   HGBA1C 7.0 (H) 03/22/2016   A/P: update a1c  We discussed upcoming [place] policy for visits for pain management being separate from regular visits. 14 week follow up for now for both  [facility] was reviewed and rx only from me and every 30 days.   Orders Placed This Encounter  Procedures  . Drug Abuse Panel 10-50, U  . CBC    [place]  . Comprehensive metabolic panel    [place]  . LDL cholesterol, direct    [place]  . Hemoglobin A1c    [place]  . Ambulatory referral to Orthopedics    Referral Priority:   Routine     Referral Type:   Consultation    Meds ordered this encounter  Medications  . oxyCODONE-acetaminophen (PERCOCET) 7.5-325 MG tablet    Sig: Take 1 tablet by mouth every 6 (six) hours as needed for severe pain (3x a day max. May fill 03/30/17).    Dispense:  90 tablet    Refill:  0  . oxyCODONE-acetaminophen (PERCOCET) 7.5-325 MG tablet    Sig: Take 1 tablet by mouth every 6 (six) hours as needed for severe pain (3x a day max. May fill 04/29/17).    Dispense:  90 tablet    Refill:  0  . oxyCODONE-acetaminophen (PERCOCET) 7.5-325 MG tablet    Sig: Take 1 tablet by mouth every 6 (six) hours as needed for severe pain (3x a day max. May fill 05/30/17).  Dispense:  90 tablet    Refill:  0    Return precautions advised.  [name], MD

## 2017-03-30 NOTE — Assessment & Plan Note (Signed)
Refills provided and UDS today

## 2017-03-30 NOTE — Patient Instructions (Addendum)
Please stop by lab before you go  Refilled pain medicine  We will call you within a week or two about your referral to orthopedics about the knee pain. If you do not hear within 3 weeks, give [place] a call.   Have you had your eye exam yet? If so, Sign release of information at the check out desk for this. If you have not- ask you to get this before I see you back

## 2017-03-30 NOTE — Addendum Note (Signed)
Addended by: Elmer Picker on: 03/30/2017 03:59 PM   Modules accepted: Orders

## 2017-03-30 NOTE — Assessment & Plan Note (Signed)
S: hopefully controlled. On metformin 500mg  now BID  Lab Results  Component Value Date   HGBA1C 7.5 (H) 12/21/2016   HGBA1C 6.9 (H) 09/19/2016   HGBA1C 7.0 (H) 03/22/2016   A/P: update a1c

## 2017-03-30 NOTE — Assessment & Plan Note (Signed)
S: controlled on amlodipine 10mg .  ASCVD 10 year risk calculation if age 76-79: on crestor 10mg  BP Readings from Last 3 Encounters:  03/30/17 120/66  12/21/16 132/70  09/19/16 118/64  A/P: We discussed blood pressure goal of <140/90. Continue current meds.

## 2017-03-30 NOTE — Assessment & Plan Note (Signed)
S: chronic pain in knees and low back. Has been told needed replacement in the past but has not want surgery. Percocet 7.5/324 #90 every 30 days. Injection into knees has lasted very short term. Last visit he reported prior had helped 6 months when previously had helped 2- he really wants anything that will help his knees feel better even despite the percocet. Pain in knees about 8/10 at its worst but percocet helps A/P: Refer to ortho for consideration of repeat steroid injections under their care vs. Viscous supplementation. Do not believe patient is open to surgery. This referral is NOT to request they take over his chronic pain medications.

## 2017-03-31 LAB — URINALYSIS, MICROSCOPIC ONLY

## 2017-04-02 LAB — URINE CULTURE

## 2017-04-03 ENCOUNTER — Other Ambulatory Visit: Payer: Self-pay

## 2017-04-03 LAB — DRUG ABUSE PANEL 10-50, U
AMPHETAMINES (1000 NG/ML SCRN): NEGATIVE
BARBITURATES: NEGATIVE
BENZODIAZEPINES: NEGATIVE
COCAINE METABOLITES: NEGATIVE
MARIJUANA MET (50 NG/ML SCRN): NEGATIVE
METHADONE: NEGATIVE
METHAQUALONE: NEGATIVE
OPIATES: NEGATIVE
PHENCYCLIDINE: NEGATIVE
PROPOXYPHENE: NEGATIVE

## 2017-04-03 MED ORDER — CEPHALEXIN 500 MG PO CAPS: 500.0000 mg | ORAL_CAPSULE | Freq: Two times a day (BID) | ORAL | 0 refills | Status: DC

## 2017-04-04 ENCOUNTER — Other Ambulatory Visit: Payer: Self-pay

## 2017-04-04 ENCOUNTER — Other Ambulatory Visit (INDEPENDENT_AMBULATORY_CARE_PROVIDER_SITE_OTHER): Payer: Medicare HMO

## 2017-04-04 DIAGNOSIS — E875 Hyperkalemia: Secondary | ICD-10-CM | POA: Diagnosis not present

## 2017-04-04 LAB — BASIC METABOLIC PANEL
BUN: 19 mg/dL (ref 6–23)
CALCIUM: 9.2 mg/dL (ref 8.4–10.5)
CHLORIDE: 105 meq/L (ref 96–112)
CO2: 29 meq/L (ref 19–32)
CREATININE: 1.26 mg/dL (ref 0.40–1.50)
GFR: 59.11 mL/min — ABNORMAL LOW (ref >60.00)
Glucose, Bld: 212 mg/dL — ABNORMAL HIGH (ref 70–99)
Potassium: 4.9 mEq/L (ref 3.5–5.1)
Sodium: 141 mEq/L (ref 135–145)

## 2017-04-04 MED ORDER — METFORMIN HCL 500 MG PO TABS: 500.0000 mg | ORAL_TABLET | Freq: Two times a day (BID) | ORAL | 3 refills | Status: DC

## 2017-04-05 ENCOUNTER — Encounter: Payer: Self-pay | Admitting: Internal Medicine

## 2017-05-03 ENCOUNTER — Ambulatory Visit (INDEPENDENT_AMBULATORY_CARE_PROVIDER_SITE_OTHER): Payer: Medicare HMO | Admitting: Orthopaedic Surgery

## 2017-05-10 ENCOUNTER — Other Ambulatory Visit: Payer: Self-pay | Admitting: Family Medicine

## 2017-05-10 DIAGNOSIS — I159 Secondary hypertension, unspecified: Secondary | ICD-10-CM

## 2017-05-10 NOTE — Progress Notes (Signed)
Subjective:   [name] is a 76 y.o. male who presents for Medicare Annual/Subsequent prevemination.  Review of Systems:  The Patient was informed that the wellness visit is to identify future health risk and educate and initiate measures that can reduce risk for increased disease through the lifespan.    Annual Wellness Assessment   Preventive Screening -Counseling & Management  Medicare Annual Preventive Care Visit - Subsequent Last OV 03/30/2017 Colonoscopy aged out; last exam 2008 PSA 0.11  Former smoker w 80 pack hx, quit 85  U/s abd 2 2016 neg for AAA CT of chest completed/ Dr Elsworth Soho  The patient no longer is appropriate for a low-dose CT referral   Describes Health as poor, fair, good or great?  C/o of pain in back and knees. Dr Yong Channel referred to  [facility]/he missed his apt. The pt will fup to reschedule an apt  Taking appr 2 to 3 pain pills abd still not relieved Fell x 1 month ago; fell on knees  States he is ready to have this problem addressed   VS reviewed;  bp in good control 03/30/17 120/66  12/21/16 132/70  09/19/16 118/64    Diet diabetes is is improved, states he eats less ice cream  BMI 26 ETOH x 20 years; quit over 20 years ago  Exercise reduced due to pain   Stressors: no Lives w spouse Wife has some memory loss but she is independent   Sleep patterns: sleeps well  Pain? Noted chronic pain in back and kness    Cardiac Risk Factors Addressed Hyperlipidemia- ldl 58 Diabetes- A1c 7.5 from 6.9 in Dec A1c 7.1 in June  A1c Obesity/ would like to lose weight  Advanced Directives- discussed and given info on [place] resources for questions.   Patient Care Team: [name], MD as PCP - General (Family Medicine) Assessed for additional providers  Immunization History  Administered Date(s) Administered  . Influenza Split 09/07/2011, 07/30/2012  . Influenza Whole 10/10/2005, 08/31/2009, 07/10/2010  . Influenza,  High Dose Seasonal PF 06/22/2016  . Influenza,inj,Quad PF,36+ Mos 07/01/2013, 06/20/2014  . Influenza-Unspecified 10/26/2015  . Meningococcal Conjugate 07/24/2014  . Pneumococcal Conjugate-13 07/24/2014  . Pneumococcal Polysaccharide-23 08/31/2009  . Td 06/10/2006   Required Immunizations needed today  Screening test up to date or reviewed for plan of completion Health Maintenance Due  Topic Date Due  . OPHTHALMOLOGY EXAM  06/13/2015  . TETANUS/TDAP  06/10/2016  . INFLUENZA VACCINE  05/10/2017   Eye exam 4-5 months ago Corneal tx hx; goes to [facility] [place] Cataracts off as well  A Tetanus is recommended every 10 years. Medicare covers a tetanus if you have a cut or wound; otherwise, there may be a charge. If you had not had a tetanus with pertusses, known as the Tdap, you can take this anytime.   Keep in mind the flu shot is an inactivated vaccine and takes at least 2 weeks to build immunity. The flu virus can be dormant for 4 days prior to symptoms Taking the flu shot at the beginning of the season can reduce the risk for the entire community.  Cardiac Risk Factors include: advanced age (>61men, >63 women);diabetes mellitus     Objective:    Vitals: Pulse 89   Ht 6\' 1"  (1.854 m)   Wt 200 lb (90.7 kg)   PF 97 L/min   BMI 26.39 kg/m   Body mass index is 26.39 kg/m.  Tobacco History  Smoking Status  . Former Smoker  . Packs/day: 4.00  . Years: 20.00  . Types: Cigarettes  . Quit date: 09/18/1984  Smokeless Tobacco  . Never Used    Comment: quit in 1980     Counseling given: Not Answered   Past Medical History:  Diagnosis Date  . Benign prostatic hypertrophy   . COPD (chronic obstructive pulmonary disease) ([place]) December 15, 2009   FEV1 2.30 (70%) ratio 63 no better with B2 and DLCO 18/6 (73%) corrects to 106%  . Coughing December 18, 2009   Sinus CT : Minor mucosal thickening at the Right Frontoethmoidal recess, otherwise  negative sinuses  .  Degenerative joint disease   . Depression   . Diabetes mellitus type II   . Hyperlipidemia   . Hypertension August 12, 2009   try off ACE for upper airway cough  . Low back pain   . Otitis externa 07/30/2012   Past Surgical History:  Procedure Laterality Date  . dvt  1980   Family History  Problem Relation Age of Onset  . Cancer Mother        melanoma  . Stroke Father    History  Sexual Activity  . Sexual activity: Yes    Outpatient Encounter Prescriptions as of 05/11/2017  Medication Sig  . acyclovir (ZOVIRAX) 400 MG tablet TAKE 1 TABLET TWICE DAILY  . albuterol (PROVENTIL HFA;VENTOLIN HFA) 108 (90 Base) MCG/ACT inhaler Inhale 2 puffs into the lungs every 6 (six) hours as needed for wheezing or shortness of breath.  Marland Kitchen amitriptyline (ELAVIL) 50 MG tablet TAKE 1 TABLET AT BEDTIME  . amLODipine (NORVASC) 10 MG tablet TAKE 1 TABLET EVERY DAY  . aspirin 81 MG tablet Take 81 mg by mouth daily.    . Cholecalciferol 1000 UNITS capsule Take 1 capsule (1,000 Units total) by mouth 2 (two) times daily.  . famotidine (PEPCID) 20 MG tablet TAKE 1 TABLET AT BEDTIME AS NEEDED FOR  HEARTBURN  . finasteride (PROSCAR) 5 MG tablet TAKE 1 TABLET EVERY DAY  . gabapentin (NEURONTIN) 300 MG capsule TAKE 1 CAPSULE TWICE DAILY  . metFORMIN (GLUCOPHAGE) 500 MG tablet Take 1 tablet (500 mg total) by mouth 2 (two) times daily with a meal.  . Multiple Vitamin (MULTIVITAMIN) capsule Take 1 capsule by mouth daily.    . NONFORMULARY OR COMPOUNDED ITEM [facility]  Peripheral Neuropathy Cream- Bupivacaine 1%, Doxepin 3%, Gabapentin 6%, Pentoxifylline 3%, Topiramate 1% Apply 1-2 grams to affected area 3-4 times daily Qty. 120 gm 3 refills  . omeprazole (PRILOSEC) 20 MG capsule TAKE 1 CAPSULE DAILY 30 TO 60 MINUTES BEFORE FIRST MEAL OF THE DAY  . oxyCODONE-acetaminophen (PERCOCET) 7.5-325 MG tablet Take 1 tablet by mouth every 6 (six) hours as needed for severe pain (3x a day max. May fill 03/30/17).    Marland Kitchen oxyCODONE-acetaminophen (PERCOCET) 7.5-325 MG tablet Take 1 tablet by mouth every 6 (six) hours as needed for severe pain (3x a day max. May fill 04/29/17).  Marland Kitchen oxyCODONE-acetaminophen (PERCOCET) 7.5-325 MG tablet Take 1 tablet by mouth every 6 (six) hours as needed for severe pain (3x a day max. May fill 05/30/17).  . rosuvastatin (CRESTOR) 10 MG tablet TAKE 1 TABLET EVERY DAY  . tamsulosin (FLOMAX) 0.4 MG CAPS capsule TAKE 1 CAPSULE EVERY DAY  . TRUEPLUS LANCETS 28G MISC Use to test blood sugars two times daily. Dx: E11.9  . [DISCONTINUED] acyclovir (ZOVIRAX) 400 MG tablet TAKE 1 TABLET TWICE DAILY  . [DISCONTINUED] amLODipine (NORVASC) 10  MG tablet TAKE 1 TABLET DAILY.  . [DISCONTINUED] cephALEXin (KEFLEX) 500 MG capsule Take 1 capsule (500 mg total) by mouth 2 (two) times daily. (Patient not taking: Reported on 05/11/2017)  . [DISCONTINUED] tamsulosin (FLOMAX) 0.4 MG CAPS capsule TAKE 1 CAPSULE EVERY DAY   No facility-administered encounter medications on file as of 05/11/2017.     Activities of Daily Living In your present state of health, do you have any difficulty performing the following activities: 05/11/2017  Hearing? N  Vision? Y  Difficulty concentrating or making decisions? N  Walking or climbing stairs? N  Dressing or bathing? N  Doing errands, shopping? N  Preparing Food and eating ? N  Using the Toilet? N  In the past six months, have you accidently leaked urine? N  Do you have problems with loss of bowel control? N  Managing your Medications? N  Managing your Finances? N  Housekeeping or managing your Housekeeping? N  Some recent data might be hidden    Patient Care Team: [name], MD as PCP - General (Family Medicine)   Assessment:     Exercise Activities and Dietary recommendations Current Exercise Habits: The patient does not participate in regular exercise at present  Goals    . patient centered          Wants to fup on pain to be more  mobile             Fall Risk Fall Risk  05/11/2017 12/21/2016 11/13/2015 11/12/2014 07/01/2013  Falls in the past year? Yes Yes No No No  Number falls in past yr: 1 2 or more - - -  Injury with Fall? - No - - -  Risk for fall due to : - - - - -  Follow up Education provided - - - -   Depression Screen PHQ 2/9 Scores 12/21/2016 11/13/2015 11/12/2014 07/01/2013  PHQ - 2 Score 0 0 0 1    Cognitive Function MMSE - Mini Mental State Exam 05/11/2017  Orientation to time 5  Orientation to Place 5  Registration 3  Attention/ Calculation 4  Recall 1  Language- name 2 objects 2  Language- repeat 1  Language- follow 3 step command 3  Language- read & follow direction 1     6CIT Screen 05/11/2017  What Year? 0 points  What month? 0 points  What time? 0 points  Count back from 20 0 points  Months in reverse 2 points  Repeat phrase 4 points  Total Score 6    Immunization History  Administered Date(s) Administered  . Influenza Split 09/07/2011, 07/30/2012  . Influenza Whole 10/10/2005, 08/31/2009, 07/10/2010  . Influenza, High Dose Seasonal PF 06/22/2016  . Influenza,inj,Quad PF,36+ Mos 07/01/2013, 06/20/2014  . Influenza-Unspecified 10/26/2015  . Meningococcal Conjugate 07/24/2014  . Pneumococcal Conjugate-13 07/24/2014  . Pneumococcal Polysaccharide-23 08/31/2009  . Td 06/10/2006   Screening Tests Health Maintenance  Topic Date Due  . OPHTHALMOLOGY EXAM  06/13/2015  . TETANUS/TDAP  06/10/2016  . INFLUENZA VACCINE  05/10/2017  . FOOT EXAM  09/19/2017  . HEMOGLOBIN A1C  09/29/2017  . PNA vac Low Risk Adult  Completed      Plan:     PCP Notes   Health Maintenance Has had eye exam x 4 or 5 months ago. Uses [facility] providers  Abnormal Screens  Tetnus is due; can take at the pharmacy  mmse was 3/28. Focus was decreased due to pain or medicine. Could not hold a pencil due  to OA  Referrals  Call placed to [facility] ortho on behalf of the patient as he missed his  apt  Patient concerns; Completed antibiotic 6/25; states ua started having an odor; no other symptoms. Declined apt and did not feel he could give a ua specimen Sent  a basket note to Dr Yong Channel to advise  Nurse Concerns; States he is ready to proceed w tx. For knees and hips. Stated back pain was causing numbness in left leg and both knees are worse. Symptoms have been noted "for some time" no falls  I called [place] and they agreed to call the patient 8/3. Dr Ninfa Linden on vacation but will set him up w another provider  Next PCP apt 9/20 unless acute needs present       I have personally reviewed and noted the following in the patient's chart:   . Medical and social history . Use of alcohol, tobacco or illicit drugs  . Current medications and supplements . Functional ability and status . Nutritional status . Physical activity . Advanced directives . List of other physicians . Hospitalizations, surgeries, and ER visits in previous 12 months . Vitals . Screenings to include cognitive, depression, and falls . Referrals and appointments  In addition, I have reviewed and discussed with patient certain preventive protocols, quality metrics, and best practice recommendations. A written personalized care plan for preventive services as well as general preventive health recommendations were provided to patient.     Wynetta Fines, RN  05/11/2017

## 2017-05-11 ENCOUNTER — Ambulatory Visit (INDEPENDENT_AMBULATORY_CARE_PROVIDER_SITE_OTHER): Payer: Medicare HMO

## 2017-05-11 VITALS — HR 89 | Ht 73.0 in | Wt 200.0 lb

## 2017-05-11 DIAGNOSIS — Z0000 Encounter for general adult medical examination without abnormal findings: Secondary | ICD-10-CM | POA: Diagnosis not present

## 2017-05-11 NOTE — Patient Instructions (Addendum)
[name] , Thank you for taking time to come for your Medicare Wellness Visit. I appreciate your ongoing commitment to your health goals. Please review the following plan we discussed and let me know if I can assist you in the future.   Manuela Schwartz will send Dr. Yong Channel a night regarding odor in urine. The patient does not want to make an appointment at this time would appreciate Dr. Yong Channel recommendation. No other symptoms at this time.  The patient will call [facility] at [phone] and retry to reschedule his appointment   A Tetanus is recommended every 10 years. Medicare covers a tetanus if you have a cut or wound; otherwise, there may be a charge. If you had not had a tetanus with pertusses, known as the Tdap, you can take this anytime.   Keep in mind the flu shot is an inactivated vaccine and takes at least 2 weeks to build immunity. The flu virus can be dormant for 4 days prior to symptoms Taking the flu shot at the beginning of the season can reduce the risk for the entire community.   Falls can be the main reason people lose their independence Think before you CLIMB  4 things you can do to prevent falls 1. Begin an exercise program to improve your leg strength and balance 2. Ask the doctor to review medicines for fall risk 3. Get an annual eye check up and update your eye glasses;  (the [employer] still assist with eyewear:  Reviewed for annual vision exam;The [facility] assistance for eyewear is coordinated through [facility]; Please call Cherlyn Labella at [phone] 4. Make your home safer by: Removing clutter and tripping hazzards Putting railing on stairs and adding grab bars to the bathroom  Have good lighting; especially on stairs and at night when getting up to the bathroom   Exercise; including walking can assist with maintaining tone and balance and help prevent falls as you age.   Will try to complete AD; Given copy  Referred to [facility] for questions [place]  offers free advance directive forms, as well as assistance in completing the forms themselves. For assistance, contact the Spiritual Care Department at [phone], or the Clinical Social Work Department at [phone].   C/O OF UTI in the past and UA has an odor.       These are the goals we discussed: Goals    . patient centered          Wants to fup on pain to be more mobile              This is a list of the screening recommended for you and due dates:  Health Maintenance  Topic Date Due  . Eye exam for diabetics  06/13/2015  . Tetanus Vaccine  06/10/2016  . Flu Shot  05/10/2017  . Complete foot exam   09/19/2017  . Hemoglobin A1C  09/29/2017  . Pneumonia vaccines  Completed     Health Maintenance, Male A healthy lifestyle and preventive care is important for your health and wellness. Ask your health care provider about what schedule of regular examinations is right for you. What should I know about weight and diet? Eat a Healthy Diet  Eat plenty of vegetables, fruits, whole grains, low-fat dairy products, and lean protein.  Do not eat a lot of foods high in solid fats, added sugars, or salt.  Maintain a Healthy Weight Regular exercise can help you achieve or maintain a healthy weight. You should:  Do at least 150 minutes of exercise each week. The exercise should increase your heart rate and make you sweat (moderate-intensity exercise).  Do strength-training exercises at least twice a week.  Watch Your Levels of Cholesterol and Blood Lipids  Have your blood tested for lipids and cholesterol every 5 years starting at 76 years of age. If you are at high risk for heart disease, you should start having your blood tested when you are 76 years old. You may need to have your cholesterol levels checked more often if: ? Your lipid or cholesterol levels are high. ? You are older than 76 years of age. ? You are at high risk for heart disease.  What should I  know about cancer screening? Many types of cancers can be detected early and may often be prevented. Lung Cancer  You should be screened every year for lung cancer if: ? You are a current smoker who has smoked for at least 30 years. ? You are a former smoker who has quit within the past 15 years.  Talk to your health care provider about your screening options, when you should start screening, and how often you should be screened.  Colorectal Cancer  Routine colorectal cancer screening usually begins at 76 years of age and should be repeated every 5-10 years until you are 76 years old. You may need to be screened more often if early forms of precancerous polyps or small growths are found. Your health care provider may recommend screening at an earlier age if you have risk factors for colon cancer.  Your health care provider may recommend using home test kits to check for hidden blood in the stool.  A small camera at the end of a tube can be used to examine your colon (sigmoidoscopy or colonoscopy). This checks for the earliest forms of colorectal cancer.  Prostate and Testicular Cancer  Depending on your age and overall health, your health care provider may do certain tests to screen for prostate and testicular cancer.  Talk to your health care provider about any symptoms or concerns you have about testicular or prostate cancer.  Skin Cancer  Check your skin from head to toe regularly.  Tell your health care provider about any new moles or changes in moles, especially if: ? There is a change in a mole's size, shape, or color. ? You have a mole that is larger than a pencil eraser.  Always use sunscreen. Apply sunscreen liberally and repeat throughout the day.  Protect yourself by wearing long sleeves, pants, a wide-brimmed hat, and sunglasses when outside.  What should I know about heart disease, diabetes, and high blood pressure?  If you are 78-54 years of age, have your blood  pressure checked every 3-5 years. If you are 51 years of age or older, have your blood pressure checked every year. You should have your blood pressure measured twice-once when you are at a hospital or clinic, and once when you are not at a hospital or clinic. Record the average of the two measurements. To check your blood pressure when you are not at a hospital or clinic, you can use: ? An automated blood pressure machine at a pharmacy. ? A home blood pressure monitor.  Talk to your health care provider about your target blood pressure.  If you are between 21-66 years old, ask your health care provider if you should take aspirin to prevent heart disease.  Have regular diabetes screenings by checking your fasting blood  sugar level. ? If you are at a normal weight and have a low risk for diabetes, have this test once every three years after the age of 27. ? If you are overweight and have a high risk for diabetes, consider being tested at a younger age or more often.  A one-time screening for abdominal aortic aneurysm (AAA) by ultrasound is recommended for men aged 72-75 years who are current or former smokers. What should I know about preventing infection? Hepatitis B If you have a higher risk for hepatitis B, you should be screened for this virus. Talk with your health care provider to find out if you are at risk for hepatitis B infection. Hepatitis C Blood testing is recommended for:  Everyone born from [birth year] through 1965.  Anyone with known risk factors for hepatitis C.  Sexually Transmitted Diseases (STDs)  You should be screened each year for STDs including gonorrhea and chlamydia if: ? You are sexually active and are younger than 76 years of age. ? You are older than 76 years of age and your health care provider tells you that you are at risk for this type of infection. ? Your sexual activity has changed since you were last screened and you are at an increased risk for chlamydia or  gonorrhea. Ask your health care provider if you are at risk.  Talk with your health care provider about whether you are at high risk of being infected with HIV. Your health care provider may recommend a prescription medicine to help prevent HIV infection.  What else can I do?  Schedule regular health, dental, and eye exams.  Stay current with your vaccines (immunizations).  Do not use any tobacco products, such as cigarettes, chewing tobacco, and e-cigarettes. If you need help quitting, ask your health care provider.  Limit alcohol intake to no more than 2 drinks per day. One drink equals 12 ounces of beer, 5 ounces of wine, or 1 ounces of hard liquor.  Do not use street drugs.  Do not share needles.  Ask your health care provider for help if you need support or information about quitting drugs.  Tell your health care provider if you often feel depressed.  Tell your health care provider if you have ever been abused or do not feel safe at home. This information is not intended to replace advice given to you by your health care provider. Make sure you discuss any questions you have with your health care provider. Document Released: 03/24/2008 Document Revised: 05/25/2016 Document Reviewed: 06/30/2015 Elsevier Interactive Patient Education  2018 [place] in the Home Falls can cause injuries and can affect people from all age groups. There are many simple things that you can do to make your home safe and to help prevent falls. What can I do on the outside of my home?  Regularly repair the edges of walkways and driveways and fix any cracks.  Remove high doorway thresholds.  Trim any shrubbery on the main path into your home.  Use bright outdoor lighting.  Clear walkways of debris and clutter, including tools and rocks.  Regularly check that handrails are securely fastened and in good repair. Both sides of any steps should have handrails.  Install  guardrails along the edges of any raised decks or porches.  Have leaves, snow, and ice cleared regularly.  Use sand or salt on walkways during winter months.  In the garage, clean up any spills right away, including grease or  oil spills. What can I do in the bathroom?  Use night lights.  Install grab bars by the toilet and in the tub and shower. Do not use towel bars as grab bars.  Use non-skid mats or decals on the floor of the tub or shower.  If you need to sit down while you are in the shower, use a plastic, non-slip stool.  Keep the floor dry. Immediately clean up any water that spills on the floor.  Remove soap buildup in the tub or shower on a regular basis.  Attach bath mats securely with double-sided non-slip rug tape.  Remove throw rugs and other tripping hazards from the floor. What can I do in the bedroom?  Use night lights.  Make sure that a bedside light is easy to reach.  Do not use oversized bedding that drapes onto the floor.  Have a firm chair that has side arms to use for getting dressed.  Remove throw rugs and other tripping hazards from the floor. What can I do in the kitchen?  Clean up any spills right away.  Avoid walking on wet floors.  Place frequently used items in easy-to-reach places.  If you need to reach for something above you, use a sturdy step stool that has a grab bar.  Keep electrical cables out of the way.  Do not use floor polish or wax that makes floors slippery. If you have to use wax, make sure that it is non-skid floor wax.  Remove throw rugs and other tripping hazards from the floor. What can I do in the stairways?  Do not leave any items on the stairs.  Make sure that there are handrails on both sides of the stairs. Fix handrails that are broken or loose. Make sure that handrails are as long as the stairways.  Check any carpeting to make sure that it is firmly attached to the stairs. Fix any carpet that is loose or  worn.  Avoid having throw rugs at the top or bottom of stairways, or secure the rugs with carpet tape to prevent them from moving.  Make sure that you have a light switch at the top of the stairs and the bottom of the stairs. If you do not have them, have them installed. What are some other fall prevention tips?  Wear closed-toe shoes that fit well and support your feet. Wear shoes that have rubber soles or low heels.  When you use a stepladder, make sure that it is completely opened and that the sides are firmly locked. Have someone hold the ladder while you are using it. Do not climb a closed stepladder.  Add color or contrast paint or tape to grab bars and handrails in your home. Place contrasting color strips on the first and last steps.  Use mobility aids as needed, such as canes, walkers, scooters, and crutches.  Turn on lights if it is dark. Replace any light bulbs that burn out.  Set up furniture so that there are clear paths. Keep the furniture in the same spot.  Fix any uneven floor surfaces.  Choose a carpet design that does not hide the edge of steps of a stairway.  Be aware of any and all pets.  Review your medicines with your healthcare provider. Some medicines can cause dizziness or changes in blood pressure, which increase your risk of falling. Talk with your health care provider about other ways that you can decrease your risk of falls. This may include working with  a physical therapist or trainer to improve your strength, balance, and endurance. This information is not intended to replace advice given to you by your health care provider. Make sure you discuss any questions you have with your health care provider. Document Released: 09/16/2002 Document Revised: 02/23/2016 Document Reviewed: 10/31/2014 Elsevier Interactive Patient Education  2017 [employer].

## 2017-05-12 ENCOUNTER — Telehealth: Payer: Self-pay

## 2017-05-12 NOTE — Telephone Encounter (Signed)
I advised yesterday that he would need an office visit. I will call him and reiterate.

## 2017-05-12 NOTE — Telephone Encounter (Signed)
The patient came in for an annual wellness visit on 8/2  Stated he recently completed the antibiotic for UTI. Once the antibiotic  was stopped, he noted the odor returned.   Declined to leave a specimen as he did not think he could void. Declined to make another apt for ov. The patient was educated regarding the consequences if he has a UTI untreated.  Please advise

## 2017-05-12 NOTE — Progress Notes (Signed)
Reviewed and agree. Signing in PCP absence. Colin Benton R., DO

## 2017-05-12 NOTE — Telephone Encounter (Signed)
Needs office visit.

## 2017-05-12 NOTE — Telephone Encounter (Signed)
Spoke with patient and scheduled him for Monday

## 2017-05-15 ENCOUNTER — Encounter: Payer: Self-pay | Admitting: Family Medicine

## 2017-05-15 ENCOUNTER — Ambulatory Visit (INDEPENDENT_AMBULATORY_CARE_PROVIDER_SITE_OTHER): Payer: Medicare HMO | Admitting: Family Medicine

## 2017-05-15 VITALS — BP 118/62 | HR 102 | Temp 98.3°F | Ht 73.0 in | Wt 200.2 lb

## 2017-05-15 DIAGNOSIS — N390 Urinary tract infection, site not specified: Secondary | ICD-10-CM | POA: Diagnosis not present

## 2017-05-15 DIAGNOSIS — Z79899 Other long term (current) drug therapy: Secondary | ICD-10-CM | POA: Diagnosis not present

## 2017-05-15 DIAGNOSIS — R8290 Unspecified abnormal findings in urine: Secondary | ICD-10-CM | POA: Diagnosis not present

## 2017-05-15 LAB — POC URINALSYSI DIPSTICK (AUTOMATED)
BILIRUBIN UA: NEGATIVE
Blood, UA: NEGATIVE
Glucose, UA: NEGATIVE
Ketones, UA: NEGATIVE
LEUKOCYTES UA: NEGATIVE
NITRITE UA: NEGATIVE
PH UA: 6.5 (ref 5.0–8.0)
Spec Grav, UA: 1.015 (ref 1.010–1.025)
Urobilinogen, UA: NEGATIVE E.U./dL — AB

## 2017-05-15 NOTE — Progress Notes (Signed)
Subjective:  [name] is a 76 y.o. year old very pleasant male patient who presents for/with See problem oriented charting ROS- urine odor. No dysuria, polyuria. Stable nocturia. No edema.    Past Medical History-  Patient Active Problem List   Diagnosis Date Noted  . Thrombocytopenia ([place]) 12/21/2015    Priority: High  . B12 deficiency 08/20/2015    Priority: High  . 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 CKD (chronic kidney disease), stage III 05/06/2015    Priority: High  . ILD (interstitial lung disease) ([place]) 07/05/2012    Priority: High  . COPD (chronic obstructive pulmonary disease) ([place]) 12/15/2009    Priority: High  . Chronic back pain. Full history 06/12/14. Pain contract signed.  05/02/2007    Priority: High  . 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 Right foot ulcer ([place]) 10/09/2015    Priority: Medium  . Diabetic polyneuropathy associated with type 2 diabetes mellitus ([place]) 08/07/2015    Priority: Medium  . Fatty liver 11/04/2014    Priority: Medium  . BPH (benign prostatic hyperplasia) 06/20/2007    Priority: Medium  . Depression 05/02/2007    Priority: Medium  . Hyperlipidemia 04/06/2007    Priority: Medium  . Essential hypertension 04/06/2007    Priority: Medium  . Osteoarthritis 04/06/2007    Priority: Medium  . 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 positive 03/29/2016    Medications- reviewed and updated Current Outpatient Prescriptions  Medication Sig Dispense Refill  . acyclovir (ZOVIRAX) 400 MG tablet TAKE 1 TABLET TWICE DAILY 180 tablet 2  . albuterol (PROVENTIL HFA;VENTOLIN HFA) 108 (90 Base) MCG/ACT inhaler Inhale 2 puffs into the lungs every 6 (six) hours as needed for wheezing or shortness of breath. 1 Inhaler 5  . amitriptyline (ELAVIL) 50 MG tablet TAKE 1 TABLET AT BEDTIME 90 tablet 3  . amLODipine (NORVASC) 10 MG  tablet TAKE 1 TABLET EVERY DAY 90 tablet 3  . aspirin 81 MG tablet Take 81 mg by mouth daily.      . Cholecalciferol 1000 UNITS capsule Take 1 capsule (1,000 Units total) by mouth 2 (two) times daily.    . famotidine (PEPCID) 20 MG tablet TAKE 1 TABLET AT BEDTIME AS NEEDED FOR  HEARTBURN 90 tablet 3  . finasteride (PROSCAR) 5 MG tablet TAKE 1 TABLET EVERY DAY 90 tablet 3  . gabapentin (NEURONTIN) 300 MG capsule TAKE 1 CAPSULE TWICE DAILY 180 capsule 1  . metFORMIN (GLUCOPHAGE) 500 MG tablet Take 1 tablet (500 mg total) by mouth 2 (two) times daily with a meal. 60 tablet 3  . Multiple Vitamin (MULTIVITAMIN) capsule Take 1 capsule by mouth daily.      . NONFORMULARY OR COMPOUNDED ITEM [facility]  Peripheral Neuropathy Cream- Bupivacaine 1%, Doxepin 3%, Gabapentin 6%, Pentoxifylline 3%, Topiramate 1% Apply 1-2 grams to affected area 3-4 times daily Qty. 120 gm 3 refills    . omeprazole (PRILOSEC) 20 MG capsule TAKE 1 CAPSULE DAILY 30 TO 60 MINUTES BEFORE FIRST MEAL OF THE DAY 90 capsule 3  . oxyCODONE-acetaminophen (PERCOCET) 7.5-325 MG tablet Take 1 tablet by mouth every 6 (six) hours as needed for severe pain (3x a day max. May fill 03/30/17). 90 tablet 0  . oxyCODONE-acetaminophen (PERCOCET) 7.5-325 MG tablet Take 1 tablet by mouth every 6 (six) hours as needed for severe pain (3x a day max. May fill  04/29/17). 90 tablet 0  . oxyCODONE-acetaminophen (PERCOCET) 7.5-325 MG tablet Take 1 tablet by mouth every 6 (six) hours as needed for severe pain (3x a day max. May fill 05/30/17). 90 tablet 0  . rosuvastatin (CRESTOR) 10 MG tablet TAKE 1 TABLET EVERY DAY 90 tablet 3  . tamsulosin (FLOMAX) 0.4 MG CAPS capsule TAKE 1 CAPSULE EVERY DAY 90 capsule 1  . TRUEPLUS LANCETS 28G MISC Use to test blood sugars two times daily. Dx: E11.9 200 each 3   No current facility-administered medications for this visit.     Objective: BP 118/62 (BP Location: Left Arm, Patient Position: Sitting, Cuff Size:  Large)   Pulse (!) 102   Temp 98.3 F (36.8 C) (Oral)   Ht 6\' 1"  (1.854 m)   Wt 200 lb 3.2 oz (90.8 kg)   SpO2 96%   BMI 26.41 kg/m  Gen: NAD, resting comfortably CV: RRR no murmurs rubs or gallops Lungs: CTAB no crackles, wheeze, rhonchi Abdomen: no suprapubic or flank pain.  Ext: no edema Skin: warm, dry Rectal: normal tone, diffusely enlarged prostate, no masses or tenderness. Non boggy.   Results for orders placed or performed in visit on 05/15/17 (from the past 24 hour(s))  POCT Urinalysis Dipstick (Automated)     Status: Abnormal   Collection Time: 05/15/17  1:30 PM  Result Value Ref Range   Color, UA Yellow    Clarity, UA Clear    Glucose, UA neg    Bilirubin, UA neg    Ketones, UA neg    Spec Grav, UA 1.015 1.010 - 1.025   Blood, UA neg    pH, UA 6.5 5.0 - 8.0   Protein, UA trace    Urobilinogen, UA negative (A) 0.2 or 1.0 E.U./dL   Nitrite, UA neg    Leukocytes, UA Negative Negative   Assessment/Plan:  Concern for UTI after recent treatment for UTI/Urine odor S: Patients symptoms started within last week. States had a urine odor that has returned after finishing.   He was treated for UTI with keflex 500mg  BID in late June due to E. Coli UTI resistant to quinolones.  Complains of dysuria: no; polyuria: no; nocturia: 1-2x; urgency: no.  Symptoms are stable- primarily odor and stable nocturia.  ROS- no fever, chills, nausea, vomiting, flank pain (worse than normal). No blood in urine.  A/P: UA reassuring. Possible but doubt UTI. Will get culture and no treatment today.  Patient to follow up if new or worsening symptoms or failure to improve.   Urinary tract infection without hematuria, site unspecified - Plan: POCT Urinalysis Dipstick (Automated), Urine Culture  High risk medication use - Plan: Drug Abuse Panel 10-50, U last UDS was negative for opiates. confirmed today that he took percocet around 8 AM today. Took 3 doses yesterday.    Orders Placed This  Encounter  Procedures  . Urine Culture  . Drug Abuse Panel 10-50, U  . POCT Urinalysis Dipstick (Automated)   Return precautions advised.  [name], MD

## 2017-05-15 NOTE — Patient Instructions (Signed)
We will let you know if this is an infection in your urine - does not appear to be on initial testing (should know by end of week). See [place] back for new or worsening symptoms

## 2017-05-22 ENCOUNTER — Ambulatory Visit (INDEPENDENT_AMBULATORY_CARE_PROVIDER_SITE_OTHER): Payer: Medicare HMO | Admitting: Podiatry

## 2017-05-22 ENCOUNTER — Encounter: Payer: Self-pay | Admitting: Podiatry

## 2017-05-22 ENCOUNTER — Telehealth: Payer: Self-pay

## 2017-05-22 DIAGNOSIS — M79675 Pain in left toe(s): Secondary | ICD-10-CM

## 2017-05-22 DIAGNOSIS — B351 Tinea unguium: Secondary | ICD-10-CM

## 2017-05-22 DIAGNOSIS — N390 Urinary tract infection, site not specified: Secondary | ICD-10-CM | POA: Diagnosis not present

## 2017-05-22 DIAGNOSIS — E1149 Type 2 diabetes mellitus with other diabetic neurological complication: Secondary | ICD-10-CM | POA: Diagnosis not present

## 2017-05-22 DIAGNOSIS — L84 Corns and callosities: Secondary | ICD-10-CM | POA: Diagnosis not present

## 2017-05-22 DIAGNOSIS — Z79899 Other long term (current) drug therapy: Secondary | ICD-10-CM | POA: Diagnosis not present

## 2017-05-22 DIAGNOSIS — M79674 Pain in right toe(s): Secondary | ICD-10-CM

## 2017-05-22 NOTE — Telephone Encounter (Signed)
Left pt a message informing him that we need another urine sample.  Told him to call us back with any questions or he could stop by at his convenience and give [place] another sample.

## 2017-05-22 NOTE — Telephone Encounter (Signed)
Spoke with Colletta [place] in lab who states that she called patient to have him come back for the urine.

## 2017-05-23 ENCOUNTER — Ambulatory Visit (INDEPENDENT_AMBULATORY_CARE_PROVIDER_SITE_OTHER): Payer: Medicare HMO | Admitting: Orthopedic Surgery

## 2017-05-23 ENCOUNTER — Ambulatory Visit (INDEPENDENT_AMBULATORY_CARE_PROVIDER_SITE_OTHER): Payer: Medicare HMO

## 2017-05-23 DIAGNOSIS — M5442 Lumbago with sciatica, left side: Secondary | ICD-10-CM | POA: Diagnosis not present

## 2017-05-23 DIAGNOSIS — G8929 Other chronic pain: Secondary | ICD-10-CM | POA: Diagnosis not present

## 2017-05-23 DIAGNOSIS — M25561 Pain in right knee: Secondary | ICD-10-CM

## 2017-05-23 DIAGNOSIS — M5441 Lumbago with sciatica, right side: Secondary | ICD-10-CM | POA: Diagnosis not present

## 2017-05-23 DIAGNOSIS — M25562 Pain in left knee: Secondary | ICD-10-CM

## 2017-05-23 NOTE — Progress Notes (Signed)
Office Visit Note   Patient: [name]           Date of Birth: April 26, 1941           MRN: [id number] Visit Date: 05/23/2017              Requested by: [name], MD [address], [place] [address] PCP: [name], MD  No chief complaint on file.     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 Patient states he has had several weeks of relief with previous intra-articular injections for his knee. He states that he is been having chronic lower back pain with radiation into the buttocks bilaterally and occasionally radiating to the posterior aspect of the left knee.  Patient states that he smoked for 40 years' 4 packs a day he states that he's been stopped for 30 years. Patient does have a history of hypertension states he takes 2 pills for hypertension denies a history of high cholesterol.  Assessment & Plan: Visit Diagnoses:  1. Chronic bilateral low back pain with left-sided sciatica   2. Chronic pain of right knee   3. Chronic pain of left knee     Plan: Patient states he would like to proceed with total knee arthroplasty on the right. We will obtain clearance from his primary care physician patient states he does have family to care for him at home with plan for discharge to home with home health therapy. Risks and benefits were discussed including infection neurovascular injury DVT need for additional surgery. Patient states he understands wishes to proceed at this time.  Follow-Up Instructions: Return in about 2 weeks (around 06/06/2017).   Ortho Exam  Patient is alert, oriented, no adenopathy, well-dressed, normal affect, normal respiratory effort. Patient has an antalgic gait he walks with varus alignment of both knees. He lacks about 20 to full extension of the left knee about 10 of full extension of the right knee. Clausen cruciates are stable he is tender to palpation  tricompartmental around the both knees. Patient has taken anti-inflammatories as well as narcotic medication for his pain he states he's been to a chiropractor for about 30 treatments for his 20 year history of lower back pain. 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 Patient complains of mechanical catching locking and giving way in both knees states that once he is on his knees he cannot stand up without assistance from a table chair or the door.  Imaging: Xr Knee 1-2 Views Left  Result Date: 05/23/2017 Two-view radiographs of the left knee shows varus alignment bone-on-bone contact medial joint line tricompartmental bony spurs with calcification of the popliteal vessels.  Xr Knee 1-2 Views Right  Result Date: 05/23/2017 Two-view radiographs of the right knee shows tricompartmental arthritic changes there is calcification of the popliteal vessels bone-on-bone contact tricompartmental osteophytic bone spurs with varus alignment.  Xr Lumbar Spine 2-3 Views  Result Date: 05/23/2017 Two-view radiographs lumbar spine shows degenerative disc disease throughout the lumbar spine with bone-on-bone contact and osteophytic bone spurs. Patient does have calcification of the aorta and no aneurysm maximum diameter 25 mm  No images are attached to the encounter.  Labs: Lab Results  Component Value Date   HGBA1C 7.1 (H) 03/30/2017   HGBA1C 7.5 (H) 12/21/2016   HGBA1C 6.9 (H) 09/19/2016   ESRSEDRATE 20 07/09/2012   ESRSEDRATE 12 06/02/2011   ESRSEDRATE 98 (H)  04/07/2011   REPTSTATUS 04/08/2011 FINAL 04/07/2011   CULT NO GROWTH 5 DAYS 04/07/2011   LABORGA ESCHERICHIA COLI 03/30/2017    Orders:  Orders Placed This Encounter  Procedures  . XR Lumbar Spine 2-3 Views  . XR Knee 1-2 Views Left  . XR Knee 1-2 Views Right   No orders of the defined types were placed in this encounter.    Procedures: No procedures performed  Clinical Data: No additional findings.  ROS:  All other  systems negative, except as noted in the HPI. Review of Systems  Objective: Vital Signs: There were no vitals taken for this visit.  Specialty Comments:  No specialty comments available.  PMFS History: Patient Active Problem List   Diagnosis Date Noted  . 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 positive 03/29/2016  . Thrombocytopenia ([place]) 12/21/2015  . Right foot ulcer ([place]) 10/09/2015  . B12 deficiency 08/20/2015  . 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 Diabetic polyneuropathy associated with type 2 diabetes mellitus ([place]) 08/07/2015  . CKD (chronic kidney disease), stage III 05/06/2015  . Fatty liver 11/04/2014  . 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 ILD (interstitial lung disease) ([place]) 07/05/2012  . COPD (chronic obstructive pulmonary disease) ([place]) 12/15/2009  . BPH (benign prostatic hyperplasia) 06/20/2007  . Depression 05/02/2007  . Chronic back pain. Full history 06/12/14. Pain contract signed.  05/02/2007  . DM (diabetes mellitus) type II controlled, neurological manifestation ([place]) 04/06/2007  . Hyperlipidemia 04/06/2007  . Essential hypertension 04/06/2007  . Osteoarthritis 04/06/2007   Past Medical History:  Diagnosis Date  . Benign prostatic hypertrophy   . COPD (chronic obstructive pulmonary disease) ([place]) December 15, 2009   FEV1 2.30 (70%) ratio 63 no better with B2 and DLCO 18/6 (73%) corrects to 106%  . Coughing December 18, 2009   Sinus CT : Minor mucosal thickening at the Right Frontoethmoidal recess, otherwise  negative sinuses  . Degenerative joint disease   . Depression   . Diabetes mellitus type II   . Hyperlipidemia   . Hypertension August 12, 2009   try off ACE for upper airway cough  . Low back pain   . Otitis externa 07/30/2012    Family History  Problem Relation Age of Onset  . Cancer Mother        melanoma  . Stroke Father     Past Surgical History:  Procedure Laterality Date  . dvt  1980   Social History   Occupational History  . retired     Armed forces operational officer  work   Social History Main Topics  . Smoking status: Former Smoker    Packs/day: 4.00    Years: 20.00    Types: Cigarettes    Quit date: 09/18/1984  . Smokeless tobacco: Never Used     Comment: quit in 1980  . Alcohol use No     Comment: no drinking since 30 years plus  . Drug use: No  . Sexual activity: Yes

## 2017-05-24 LAB — URINE CULTURE

## 2017-05-24 NOTE — Progress Notes (Signed)
Subjective: 76 y.o. returns the office today for painful, elongated, thickened toenails which he cannot trim himself. Denies any redness or drainage around the nails. Also has a painful recurrent callus of the right foot where the wound used to be. Denies any open sores denies any redness or drainage coming from the area. Denies any acute changes since last appointment and no new complaints today. Denies any systemic complaints such as fevers, chills, nausea, vomiting.   Objective: AAO 3, NAD DP/PT pulses palpable, CRT less than 3 seconds Protective sensation decreased with Simms Weinstein monofilament, Achilles tendon reflex intact.  Nails hypertrophic, dystrophic, elongated, brittle, discolored 10. There is tenderness overlying the nails 1-5 bilaterally. There is no surrounding erythema or drainage along the nail sites. Hyperkeratotic lesion right submetatarsal 1. After debridement there is no underlying ulceration, drainage or any clinical signs of infection present. No open lesions or pre-ulcerative lesions are identified. No other areas of tenderness bilateral lower extremities. No overlying edema, erythema, increased warmth. No pain with calf compression, swelling, warmth, erythema.  Assessment: Patient presents with symptomatic onychomycosis; pre-ulcerative callus  Plan: -Treatment options including alternatives, risks, complications were discussed -Nails sharply debrided 10. Small amount of bleeding to the left hallux toenail but there is no cut out of the skin. So and I chose applied. Antibiotic ointment and a Band-Aid was applied. Monitor for any signs or symptoms of infection and nonhealing wound in 2 weeks to call the office for follow-up or sooner if any issues are to arise.  -Hyperkeratotic lesion sharply debrided 1 without complications or bleeding. -Discussed daily foot inspection. If there are any changes, to call the office immediately.  -Follow-up in 3 months or sooner if  any problems are to arise. In the meantime, encouraged to call the office with any questions, concerns, changes symptoms.  Celesta Gentile, DPM

## 2017-05-31 ENCOUNTER — Telehealth: Payer: Self-pay

## 2017-05-31 NOTE — Telephone Encounter (Signed)
Called patient to schedule him to see Dr. Yong Channel as we received a form from [facility] for surgical clearance for a TKA.  I left a voicemail message asking him to call the office and schedule the visit.

## 2017-06-01 LAB — DRUG ABUSE PANEL 10-50, U
AMPHETAMINES (1000 ng/mL SCRN): NEGATIVE
BARBITURATES: NEGATIVE
BENZODIAZEPINES: NEGATIVE
COCAINE METABOLITES: NEGATIVE
MARIJUANA MET (50 ng/mL SCRN): NEGATIVE
METHADONE: NEGATIVE
METHAQUALONE: NEGATIVE
OPIATES: NEGATIVE
PHENCYCLIDINE: NEGATIVE
PROPOXYPHENE: NEGATIVE

## 2017-06-05 ENCOUNTER — Encounter: Payer: Self-pay | Admitting: Family Medicine

## 2017-06-05 ENCOUNTER — Ambulatory Visit (INDEPENDENT_AMBULATORY_CARE_PROVIDER_SITE_OTHER): Payer: Medicare HMO | Admitting: Family Medicine

## 2017-06-05 VITALS — BP 124/68 | HR 99 | Temp 98.1°F | Ht 73.0 in | Wt 198.8 lb

## 2017-06-05 DIAGNOSIS — G8929 Other chronic pain: Secondary | ICD-10-CM

## 2017-06-05 DIAGNOSIS — M545 Low back pain: Secondary | ICD-10-CM | POA: Diagnosis not present

## 2017-06-05 MED ORDER — DICLOFENAC SODIUM 1 % TD GEL: 2.0000 g | Freq: Four times a day (QID) | TRANSDERMAL | 3 refills | Status: DC

## 2017-06-05 NOTE — Patient Instructions (Addendum)
For the 70 remaining tablets of percocet 1. Take one tablet twice a day for next 2 weeks (#28) 2. Take 1/2 tablet twice a day for following 2 weeks (#14) 3. Take 1/2 tablet once a day for 2 weeks (7)  You will then have a few more half tablets you can take sparingly  I can refer you to pain management if you change your mind  Try voltaren gel (if affordable)  Can use tylenol 650mg  up to every 6 hours.   Would not take aleve, ibuprofen, motrin, etc. Due to your kidneys  Recheck in 2 months to see how you are doing

## 2017-06-05 NOTE — Progress Notes (Signed)
Subjective:  [name] is a 76 y.o. year old very pleasant male patient who presents for/with See problem oriented charting ROS- continued knee, hip, low back pain.  No fevers/chills or chest pain  Past Medical History-  Patient Active Problem List   Diagnosis Date Noted  . B12 deficiency 08/20/2015    Priority: High  . 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 CKD (chronic kidney disease), stage III 05/06/2015    Priority: High  . ILD (interstitial lung disease) ([place]) 07/05/2012    Priority: High  . COPD (chronic obstructive pulmonary disease) ([place]) 12/15/2009    Priority: High  . Chronic back pain. Off narcotics 06/05/17 due to negative UDS for opiates x2. Full history 06/12/14. Pain contract signed.  05/02/2007    Priority: High  . DM (diabetes mellitus) type II controlled, neurological manifestation ([place]) 04/06/2007    Priority: High  . Thrombocytopenia ([place]) 12/21/2015    Priority: Medium  . Right foot ulcer ([place]) 10/09/2015    Priority: Medium  . Diabetic polyneuropathy associated with type 2 diabetes mellitus ([place]) 08/07/2015    Priority: Medium  . Fatty liver 11/04/2014    Priority: Medium  . BPH (benign prostatic hyperplasia) 06/20/2007    Priority: Medium  . Depression 05/02/2007    Priority: Medium  . Hyperlipidemia 04/06/2007    Priority: Medium  . Essential hypertension 04/06/2007    Priority: Medium  . Osteoarthritis 04/06/2007    Priority: Medium  . 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 positive 03/29/2016    Medications- reviewed and updated Current Outpatient Prescriptions  Medication Sig Dispense Refill  . acyclovir (ZOVIRAX) 400 MG tablet TAKE 1 TABLET TWICE DAILY 180 tablet 2  . albuterol (PROVENTIL HFA;VENTOLIN HFA) 108 (90 Base) MCG/ACT inhaler Inhale 2 puffs into the lungs every 6 (six) hours as needed for wheezing or shortness of breath. 1 Inhaler 5  . amitriptyline (ELAVIL) 50 MG tablet TAKE 1  TABLET AT BEDTIME 90 tablet 3  . amLODipine (NORVASC) 10 MG tablet TAKE 1 TABLET EVERY DAY 90 tablet 3  . aspirin 81 MG tablet Take 81 mg by mouth daily.      . Cholecalciferol 1000 UNITS capsule Take 1 capsule (1,000 Units total) by mouth 2 (two) times daily.    . famotidine (PEPCID) 20 MG tablet TAKE 1 TABLET AT BEDTIME AS NEEDED FOR  HEARTBURN 90 tablet 3  . finasteride (PROSCAR) 5 MG tablet TAKE 1 TABLET EVERY DAY 90 tablet 3  . gabapentin (NEURONTIN) 300 MG capsule TAKE 1 CAPSULE TWICE DAILY 180 capsule 1  . metFORMIN (GLUCOPHAGE) 500 MG tablet Take 1 tablet (500 mg total) by mouth 2 (two) times daily with a meal. 60 tablet 3  . Multiple Vitamin (MULTIVITAMIN) capsule Take 1 capsule by mouth daily.      . NONFORMULARY OR COMPOUNDED ITEM [facility]  Peripheral Neuropathy Cream- Bupivacaine 1%, Doxepin 3%, Gabapentin 6%, Pentoxifylline 3%, Topiramate 1% Apply 1-2 grams to affected area 3-4 times daily Qty. 120 gm 3 refills    . omeprazole (PRILOSEC) 20 MG capsule TAKE 1 CAPSULE DAILY 30 TO 60 MINUTES BEFORE FIRST MEAL OF THE DAY 90 capsule 3  . oxyCODONE-acetaminophen (PERCOCET) 7.5-325 MG tablet Take 1 tablet by mouth every 6 (six) hours as needed for severe pain (3x a day max. May fill 03/30/17). 90 tablet 0  . oxyCODONE-acetaminophen (PERCOCET) 7.5-325 MG tablet Take 1 tablet by mouth every 6 (six) hours  as needed for severe pain (3x a day max. May fill 04/29/17). 90 tablet 0  . oxyCODONE-acetaminophen (PERCOCET) 7.5-325 MG tablet Take 1 tablet by mouth every 6 (six) hours as needed for severe pain (3x a day max. May fill 05/30/17). 90 tablet 0  . rosuvastatin (CRESTOR) 10 MG tablet TAKE 1 TABLET EVERY DAY 90 tablet 3  . tamsulosin (FLOMAX) 0.4 MG CAPS capsule TAKE 1 CAPSULE EVERY DAY 90 capsule 1  . TRUEPLUS LANCETS 28G MISC Use to test blood sugars two times daily. Dx: E11.9 200 each 3  . diclofenac sodium (VOLTAREN) 1 % GEL Apply 2 g topically 4 (four) times daily. 100 g 3   No  current facility-administered medications for this visit.     Objective: BP 124/68 (BP Location: Left Arm, Patient Position: Sitting, Cuff Size: Large)   Pulse 99   Temp 98.1 F (36.7 C) (Oral)   Ht 6\' 1"  (1.854 m)   Wt 198 lb 12.8 oz (90.2 kg)   SpO2 95%   BMI 26.23 kg/m  Gen: NAD  Assessment/Plan:  Chronic back pain. Off narcotics 06/05/17 due to negative UDS for opiates x2. Full history 06/12/14. Pain contract signed.  S: continued pain In knees, back, and hips. Came in today for surgical clearance for knee replacement but states has changed his mind and does not want surgery. He has about 70 percocet 7.5-325 tablets left over- states taking TID A/P: Extended discussion today- patient screened negative for opiates on 2 UDS. Before the last one he confirmed he had taken 1 dose of percocet 7 hours prior to test and then prior 24 hours had taken 3 and that he had been on 3 a day longterm yet still tested negative. I told patient that I would no longer be prescribing medication as this sent up a flag for potential diversion.   After this patient stated he actually had not taken percocet before the last visit- he states he didn't want it to look like he was a "druggie". I then challenged him on this stating then why did you tell me you had taken the medicine and he stated that he tries to tell me what he thinks I want to hear. He tried to convince me to prescribe again and I was very firm that I would no longer be prescribing this medication. Even if the test was a false negative x2- he would be best managed in pain management hands for their expertise on drug testing.   Since he has 70 tablets, we discussed a wean (see avs) and alternate options for management including tylenol per avs and voltaren gel. We discussed pain management referral but he declined for unclear reasons and states wants to see if he can manage the pain with options we discussed.      Future Appointments Date Time  Provider [place]  06/29/2017 1:30 PM [name], MD [facility]  08/24/2017 1:15 PM Trula Slade, DPM [facility]   Meds ordered this encounter  Medications  . diclofenac sodium (VOLTAREN) 1 % GEL    Sig: Apply 2 g topically 4 (four) times daily.    Dispense:  100 g    Refill:  3   The duration of face-to-face time during this visit was greater than 25 minutes. Greater than 50% of this time was spent in counseling, explanation of diagnosis, planning of further management, and/or coordination of care including discussion of potential diversion, discussing pain management options off of narcotics, discussing  his decision to not get knee replacment.    Return precautions advised.  [name], MD

## 2017-06-05 NOTE — Assessment & Plan Note (Signed)
S: continued pain In knees, back, and hips. Came in today for surgical clearance for knee replacement but states has changed his mind and does not want surgery. He has about 70 percocet 7.5-325 tablets left over- states taking TID A/P: Extended discussion today- patient screened negative for opiates on 2 UDS. Before the last one he confirmed he had taken 1 dose of percocet 7 hours prior to test and then prior 24 hours had taken 3 and that he had been on 3 a day longterm yet still tested negative. I told patient that I would no longer be prescribing medication as this sent up a flag for potential diversion.   After this patient stated he actually had not taken percocet before the last visit- he states he didn't want it to look like he was a "druggie". I then challenged him on this stating then why did you tell me you had taken the medicine and he stated that he tries to tell me what he thinks I want to hear. He tried to convince me to prescribe again and I was very firm that I would no longer be prescribing this medication. Even if the test was a false negative x2- he would be best managed in pain management hands for their expertise on drug testing.   Since he has 70 tablets, we discussed a wean (see avs) and alternate options for management including tylenol per avs and voltaren gel. We discussed pain management referral but he declined for unclear reasons and states wants to see if he can manage the pain with options we discussed.

## 2017-06-29 ENCOUNTER — Encounter: Payer: Self-pay | Admitting: Family Medicine

## 2017-06-29 ENCOUNTER — Ambulatory Visit (INDEPENDENT_AMBULATORY_CARE_PROVIDER_SITE_OTHER): Payer: Medicare HMO | Admitting: Family Medicine

## 2017-06-29 VITALS — BP 122/78 | HR 108 | Temp 98.6°F | Ht 73.0 in | Wt 198.0 lb

## 2017-06-29 DIAGNOSIS — M5442 Lumbago with sciatica, left side: Secondary | ICD-10-CM | POA: Diagnosis not present

## 2017-06-29 DIAGNOSIS — G8929 Other chronic pain: Secondary | ICD-10-CM

## 2017-06-29 DIAGNOSIS — Z23 Encounter for immunization: Secondary | ICD-10-CM

## 2017-06-29 NOTE — Progress Notes (Signed)
Subjective:  [name] is a 76 y.o. year old very pleasant male patient who presents for/with See problem oriented charting ROS- complains of back pain going into his left leg. No leg weakness or falls.    Past Medical History-  Patient Active Problem List   Diagnosis Date Noted  . B12 deficiency 08/20/2015    Priority: High  . 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 CKD (chronic kidney disease), stage III 05/06/2015    Priority: High  . ILD (interstitial lung disease) ([place]) 07/05/2012    Priority: High  . COPD (chronic obstructive pulmonary disease) ([place]) 12/15/2009    Priority: High  . Chronic back pain. Off narcotics 06/05/17 due to negative UDS for opiates x2. Full history 06/12/14. Pain contract signed.  05/02/2007    Priority: High  . DM (diabetes mellitus) type II controlled, neurological manifestation ([place]) 04/06/2007    Priority: High  . Thrombocytopenia ([place]) 12/21/2015    Priority: Medium  . Right foot ulcer ([place]) 10/09/2015    Priority: Medium  . Diabetic polyneuropathy associated with type 2 diabetes mellitus ([place]) 08/07/2015    Priority: Medium  . Fatty liver 11/04/2014    Priority: Medium  . BPH (benign prostatic hyperplasia) 06/20/2007    Priority: Medium  . Depression 05/02/2007    Priority: Medium  . Hyperlipidemia 04/06/2007    Priority: Medium  . Essential hypertension 04/06/2007    Priority: Medium  . Osteoarthritis 04/06/2007    Priority: Medium  . 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 positive 03/29/2016    Medications- reviewed and updated Current Outpatient Prescriptions  Medication Sig Dispense Refill  . acyclovir (ZOVIRAX) 400 MG tablet TAKE 1 TABLET TWICE DAILY 180 tablet 2  . albuterol (PROVENTIL HFA;VENTOLIN HFA) 108 (90 Base) MCG/ACT inhaler Inhale 2 puffs into the lungs every 6 (six) hours as needed for wheezing or shortness of breath. 1 Inhaler 5  . amitriptyline (ELAVIL) 50 MG tablet  TAKE 1 TABLET AT BEDTIME 90 tablet 3  . amLODipine (NORVASC) 10 MG tablet TAKE 1 TABLET EVERY DAY 90 tablet 3  . aspirin 81 MG tablet Take 81 mg by mouth daily.      . Cholecalciferol 1000 UNITS capsule Take 1 capsule (1,000 Units total) by mouth 2 (two) times daily.    . diclofenac sodium (VOLTAREN) 1 % GEL Apply 2 g topically 4 (four) times daily. 100 g 3  . famotidine (PEPCID) 20 MG tablet TAKE 1 TABLET AT BEDTIME AS NEEDED FOR  HEARTBURN 90 tablet 3  . finasteride (PROSCAR) 5 MG tablet TAKE 1 TABLET EVERY DAY 90 tablet 3  . gabapentin (NEURONTIN) 300 MG capsule TAKE 1 CAPSULE TWICE DAILY 180 capsule 1  . metFORMIN (GLUCOPHAGE) 500 MG tablet Take 1 tablet (500 mg total) by mouth 2 (two) times daily with a meal. 60 tablet 3  . Multiple Vitamin (MULTIVITAMIN) capsule Take 1 capsule by mouth daily.      . NONFORMULARY OR COMPOUNDED ITEM [facility]  Peripheral Neuropathy Cream- Bupivacaine 1%, Doxepin 3%, Gabapentin 6%, Pentoxifylline 3%, Topiramate 1% Apply 1-2 grams to affected area 3-4 times daily Qty. 120 gm 3 refills    . omeprazole (PRILOSEC) 20 MG capsule TAKE 1 CAPSULE DAILY 30 TO 60 MINUTES BEFORE FIRST MEAL OF THE DAY 90 capsule 3  . oxyCODONE-acetaminophen (PERCOCET) 7.5-325 MG tablet     . rosuvastatin (CRESTOR) 10 MG tablet TAKE 1 TABLET EVERY DAY 90 tablet  3  . tamsulosin (FLOMAX) 0.4 MG CAPS capsule TAKE 1 CAPSULE EVERY DAY 90 capsule 1  . TRUEPLUS LANCETS 28G MISC Use to test blood sugars two times daily. Dx: E11.9 200 each 3   No current facility-administered medications for this visit.     Objective: BP 122/78   Pulse (!) 108   Temp 98.6 F (37 C) (Oral)   Ht 6\' 1"  (1.854 m)   Wt 198 lb (89.8 kg)   SpO2 97%   BMI 26.12 kg/m  Gen: NAD, resting comfortably in his chair CV: regular rhythm but high rate- seems to run high normal at least at baseline.  Lungs: CTAB no crackles, wheeze, rhonchi Abdomen: soft/nontender/nondistended. obese Ext: no edema Skin:  warm, dry Neuro: antalgic gait Msk: pain with palpation of low back   Assessment/Plan:  Chronic back pain. Off narcotics 06/05/17 due to negative UDS for opiates x2. Full history 06/12/14. Pain contract signed.  S: started wean a month ago from percocet- he has been off of percocet for several days.  He was taken off of these due to UDS being negative for opiates despite him reporting taking medication within 16 hours and typically taking 3 doses a day.   He declined pain management referral (is at least considering now). Pain radiates into his left leg. His knee and back pain have worsened. Does not want knee surgery. Already set up with [facility]- wants to discuss options for his back. He is taking tylenol 500mg  8 a day- discussed 6 a day max. voltaren gel helps some.  A/P: Discussed with patient as he is established with [facility]- all he has to do is call for follow up and can discuss his back. He wants to reconsider knee injections or potentially injections into the back. We discussed 6 tylenol per day absolute max. Did discuss injections could help radiculopathy element into left leg potentially but unlikely to change course of his back pain.    Future Appointments Date Time Provider [place]  07/10/2017 2:15 PM Newt Minion, MD [facility] None  08/24/2017 1:15 PM Trula Slade, DPM [facility]  09/28/2017 1:30 PM [name], MD [facility] None   Return in about 3 months (around 09/28/2017) for follow up- or sooner if needed. Will need DM evaluation during visit  Orders Placed This Encounter  Procedures  . Flu vaccine HIGH DOSE PF   The duration of face-to-face time during this visit was greater than 15 minutes. Greater than 50% of this time was spent in counseling, explanation of diagnosis, planning of further management, and/or coordination of care including discussion of tylenol max dose, reasoning for no narcotics, discussion of follow up options  particularly pain management- he would prefer to see ortho first and consider pain management. .   Return precautions advised.  [name], MD

## 2017-06-29 NOTE — Patient Instructions (Addendum)
Call [facility] Address: [address], [place], [place] [address] Phone: [phone]  They should be able to help with injections in the knee or back if needed.   Flu shot today before you leave- high dose  Have eye doctor send [place] a copy of his note.   See me back in 2-3 months

## 2017-07-02 NOTE — Assessment & Plan Note (Addendum)
S: started wean a month ago from percocet- he has been off of percocet for several days.  He was taken off of these due to UDS being negative for opiates despite him reporting taking medication within 16 hours and typically taking 3 doses a day.   He declined pain management referral (is at least considering now). Pain radiates into his left leg. His knee and back pain have worsened. Does not want knee surgery. Already set up with [facility]- wants to discuss options for his back. He is taking tylenol 500mg  8 a day- discussed 6 a day max. voltaren gel helps some.  A/P: Discussed with patient as he is established with [facility]- all he has to do is call for follow up and can discuss his back. He wants to reconsider knee injections or potentially injections into the back. We discussed 6 tylenol per day absolute max. Did discuss injections could help radiculopathy element into left leg potentially but unlikely to change course of his back pain.

## 2017-07-10 ENCOUNTER — Encounter (INDEPENDENT_AMBULATORY_CARE_PROVIDER_SITE_OTHER): Payer: Self-pay | Admitting: Orthopedic Surgery

## 2017-07-10 ENCOUNTER — Ambulatory Visit (INDEPENDENT_AMBULATORY_CARE_PROVIDER_SITE_OTHER): Payer: Medicare HMO

## 2017-07-10 ENCOUNTER — Ambulatory Visit (INDEPENDENT_AMBULATORY_CARE_PROVIDER_SITE_OTHER): Payer: Medicare HMO | Admitting: Orthopedic Surgery

## 2017-07-10 DIAGNOSIS — M5416 Radiculopathy, lumbar region: Secondary | ICD-10-CM

## 2017-07-10 DIAGNOSIS — M1712 Unilateral primary osteoarthritis, left knee: Secondary | ICD-10-CM

## 2017-07-10 DIAGNOSIS — M170 Bilateral primary osteoarthritis of knee: Secondary | ICD-10-CM

## 2017-07-10 DIAGNOSIS — M1711 Unilateral primary osteoarthritis, right knee: Secondary | ICD-10-CM

## 2017-07-10 MED ORDER — LIDOCAINE HCL 1 % IJ SOLN
5.0000 mL | INTRAMUSCULAR | Status: AC | PRN
  Administered 2017-07-10: 5 mL

## 2017-07-10 MED ORDER — METHYLPREDNISOLONE ACETATE 40 MG/ML IJ SUSP
40.0000 mg | INTRAMUSCULAR | Status: AC | PRN
  Administered 2017-07-10: 40 mg via INTRA_ARTICULAR

## 2017-07-10 NOTE — Progress Notes (Signed)
Office Visit Note   Patient: [name]           Date of Birth: 1941-09-16           MRN: [id number] Visit Date: 07/10/2017              Requested by: [name], MD [address], [place] [id number] PCP: [name], MD  Chief Complaint  Patient presents with  . Left Knee - Pain  . Right Knee - Pain  . Lower Back - Pain      HPI: The patient is a 76 year old gentleman who presents today complaining of bilateral knee pain left worse than right. Long standing history of osteoarthritis bilaterally was recently scheduled for total knee replacement has decided to hold on surgery will not like to proceed at this time with joint replacement. Complaining of him locking catching and popping in bilateral knees increased pain with ambulation.  Also complaining of low back pain this is chronic and no recent injury having numbness and shooting pain down his left cheek and lateral thigh to the knee. Is requesting steroid injection Has had has good relief. Last radiographs and MRI of lumbar spine in 2009. We will update his x-ray today.  States "ever since I got off the dope my back and knees have been killing me."  Assessment & Plan: Visit Diagnoses:  1. Radiculopathy, lumbar region     Plan: Refer to Dr. Ernestina Patches to evaluate for [facility]. Injection bilateral knees with depomedrol. Will follow up in office as needed for knee pain.  Follow-Up Instructions: No Follow-up on file.   Right Knee Exam   Tenderness  The patient is experiencing tenderness in the lateral joint line.  Muscle Strength   The patient has normal right knee strength.  Tests  Varus: negative Valgus: negative  Other  Swelling: mild  Comments:  No crepitation   Left Knee Exam   Tenderness  The patient is experiencing tenderness in the medial joint line.  Muscle Strength   The patient has normal left knee strength.  Tests  Varus: negative Valgus: negative  Other    Swelling: mild   Back Exam   Tenderness  The patient is experiencing no tenderness.   Range of Motion  The patient has normal back ROM.  Tests  Straight leg raise right: negative Straight leg raise left: negative      Patient is alert, oriented, no adenopathy, well-dressed, normal affect, normal respiratory effort.   Imaging: No results found. No images are attached to the encounter.  Labs: Lab Results  Component Value Date   HGBA1C 7.1 (H) 03/30/2017   HGBA1C 7.5 (H) 12/21/2016   HGBA1C 6.9 (H) 09/19/2016   ESRSEDRATE 20 07/09/2012   ESRSEDRATE 12 06/02/2011   ESRSEDRATE 98 (H) 04/07/2011   REPTSTATUS 04/08/2011 FINAL 04/07/2011   CULT NO GROWTH 5 DAYS 04/07/2011   LABORGA  05/22/2017    Three or more organisms present,each greater than 10,000 CFU/mL.These organisms,commonly found on external and internal genitalia,are considered to be colonizers.No further testing performed.     Orders:  Orders Placed This Encounter  Procedures  . XR Lumbar Spine 2-3 Views   No orders of the defined types were placed in this encounter.    Procedures: Large Joint Inj Date/Time: 07/10/2017 3:15 PM Performed by: Suzan Slick Authorized by: [name] R   Consent Given by:  Patient Site marked: the procedure site was marked   Timeout: prior to  procedure the correct patient, procedure, and site was verified   Indications:  Pain and diagnostic evaluation Location:  Knee Site:  R knee Needle Size:  22 G Needle Length:  1.5 inches Ultrasound Guidance: No   Fluoroscopic Guidance: No   Arthrogram: No   Medications:  5 mL lidocaine 1 %; 40 mg methylPREDNISolone acetate 40 MG/ML Aspiration Attempted: No   Patient tolerance:  Patient tolerated the procedure well with no immediate complications Large Joint Inj Date/Time: 07/10/2017 3:15 PM Performed by: Suzan Slick Authorized by: [name] R   Consent Given by:  Patient Site marked: the procedure site was  marked   Timeout: prior to procedure the correct patient, procedure, and site was verified   Indications:  Pain and diagnostic evaluation Location:  Knee Site:  L knee Needle Size:  22 G Needle Length:  1.5 inches Ultrasound Guidance: No   Fluoroscopic Guidance: No   Arthrogram: No   Medications:  5 mL lidocaine 1 %; 40 mg methylPREDNISolone acetate 40 MG/ML Aspiration Attempted: No   Patient tolerance:  Patient tolerated the procedure well with no immediate complications    Clinical Data: No additional findings.  ROS:  All other systems negative, except as noted in the HPI. Review of Systems  Constitutional: Negative for chills and fever.  Musculoskeletal: Positive for arthralgias and back pain.  Neurological: Positive for numbness. Negative for weakness.    Objective: Vital Signs: There were no vitals taken for this visit.  Specialty Comments:  No specialty comments available.  PMFS History: Patient Active Problem List   Diagnosis Date Noted  . 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 positive 03/29/2016  . Thrombocytopenia ([place]) 12/21/2015  . Right foot ulcer ([place]) 10/09/2015  . B12 deficiency 08/20/2015  . 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 Diabetic polyneuropathy associated with type 2 diabetes mellitus ([place]) 08/07/2015  . CKD (chronic kidney disease), stage III ([place]) 05/06/2015  . Fatty liver 11/04/2014  . 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 ILD (interstitial lung disease) ([place]) 07/05/2012  . COPD (chronic obstructive pulmonary disease) ([place]) 12/15/2009  . BPH (benign prostatic hyperplasia) 06/20/2007  . Depression 05/02/2007  . Chronic back pain. Off narcotics 06/05/17 due to negative UDS for opiates x2. Full history 06/12/14. Pain contract signed.  05/02/2007  . DM (diabetes mellitus) type II controlled, neurological manifestation ([place]) 04/06/2007  . Hyperlipidemia 04/06/2007  . Essential hypertension 04/06/2007  . Osteoarthritis 04/06/2007   Past Medical History:    Diagnosis Date  . Benign prostatic hypertrophy   . COPD (chronic obstructive pulmonary disease) ([place]) December 15, 2009   FEV1 2.30 (70%) ratio 63 no better with B2 and DLCO 18/6 (73%) corrects to 106%  . Coughing December 18, 2009   Sinus CT : Minor mucosal thickening at the Right Frontoethmoidal recess, otherwise  negative sinuses  . Degenerative joint disease   . Depression   . Diabetes mellitus type II   . Hyperlipidemia   . Hypertension August 12, 2009   try off ACE for upper airway cough  . Low back pain   . Otitis externa 07/30/2012    Family History  Problem Relation Age of Onset  . Cancer Mother        melanoma  . Stroke Father     Past Surgical History:  Procedure Laterality Date  . dvt  1980   Social History   Occupational History  . 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 Smoking status: Former Smoker  Packs/day: 4.00    Years: 20.00    Types: Cigarettes    Quit date: 09/18/1984  . Smokeless tobacco: Never Used     Comment: quit in 1980  . Alcohol use No     Comment: no drinking since 30 years plus  . Drug use: No  . Sexual activity: Yes

## 2017-07-14 ENCOUNTER — Telehealth (INDEPENDENT_AMBULATORY_CARE_PROVIDER_SITE_OTHER): Payer: Self-pay

## 2017-07-14 NOTE — Telephone Encounter (Signed)
Patient called [facility] stating he was seen on Monday and had xrays of his knees and back. He was told that the doctor would call him with the results. He stated he was upset that it was Friday and he had not gotten any answers. He wants someone to call him about his images. [phone]

## 2017-07-17 ENCOUNTER — Other Ambulatory Visit (INDEPENDENT_AMBULATORY_CARE_PROVIDER_SITE_OTHER): Payer: Self-pay | Admitting: Family

## 2017-07-17 ENCOUNTER — Telehealth (INDEPENDENT_AMBULATORY_CARE_PROVIDER_SITE_OTHER): Payer: Self-pay | Admitting: Radiology

## 2017-07-17 DIAGNOSIS — M5416 Radiculopathy, lumbar region: Secondary | ICD-10-CM

## 2017-07-17 NOTE — Telephone Encounter (Signed)
Advised patient on voicemail of message below. No xrays were taken of knee at last office visit, only of his back. Advised worsening arthritis at L2-3. Advised of bone spurring. Advised that he would be getting set up for ESI with Dr. Ernestina Patches.

## 2017-07-17 NOTE — Telephone Encounter (Signed)
-----   Message from Suzan Slick, NP sent at 07/17/2017 10:08 AM EDT ----- Degenerative changes in back a little worse since mri in 2009, have ordered ESI with [facility], should get call to schedule this from them  They will/can talk about his xray further in person

## 2017-07-19 ENCOUNTER — Telehealth (INDEPENDENT_AMBULATORY_CARE_PROVIDER_SITE_OTHER): Payer: Self-pay | Admitting: Radiology

## 2017-07-19 NOTE — Telephone Encounter (Signed)
Patient calling left voicemail at 3:21pm wanting to know when his injection will be schedule. Courtney already sent message to Dr. Ernestina Patches to see if he would need consult before ESI injection. Do you know if [facility] is scheduling faster for these. I know Loma Sousa had said he was scheduled all next week for injections. Before I call to offer him that as an option I wanted to make sure they were not scheduling out like Dr. Ernestina Patches was.

## 2017-07-19 NOTE — Telephone Encounter (Signed)
They are booked until oct 23 first avail

## 2017-07-19 NOTE — Telephone Encounter (Signed)
I tried calling patient but there is no answer. Will advised that Dr. Ernestina Patches is schedule is until 4th week of October. So is [facility]. He will unfortunately have to wait until first available.

## 2017-07-24 NOTE — Telephone Encounter (Signed)
[name] has tried to leave voicemails for patient to schedule.

## 2017-07-26 ENCOUNTER — Other Ambulatory Visit: Payer: Self-pay | Admitting: Family Medicine

## 2017-07-27 ENCOUNTER — Encounter (INDEPENDENT_AMBULATORY_CARE_PROVIDER_SITE_OTHER): Payer: Self-pay | Admitting: Physical Medicine and Rehabilitation

## 2017-07-27 ENCOUNTER — Ambulatory Visit (INDEPENDENT_AMBULATORY_CARE_PROVIDER_SITE_OTHER): Payer: Medicare HMO

## 2017-07-27 ENCOUNTER — Ambulatory Visit (INDEPENDENT_AMBULATORY_CARE_PROVIDER_SITE_OTHER): Payer: Medicare HMO | Admitting: Physical Medicine and Rehabilitation

## 2017-07-27 VITALS — BP 144/77 | HR 91 | Temp 97.7°F

## 2017-07-27 DIAGNOSIS — M5416 Radiculopathy, lumbar region: Secondary | ICD-10-CM

## 2017-07-27 MED ORDER — BETAMETHASONE SOD PHOS & ACET 6 (3-3) MG/ML IJ SUSP
12.0000 mg | Freq: Once | INTRAMUSCULAR | Status: AC
  Administered 2017-07-27: 12 mg

## 2017-07-27 MED ORDER — LIDOCAINE HCL (PF) 1 % IJ SOLN
2.0000 mL | Freq: Once | INTRAMUSCULAR | Status: AC
  Administered 2017-07-27: 2 mL

## 2017-07-27 NOTE — Progress Notes (Deleted)
Lower back pain, left side pain, with radiating pain to left leg. Pain worse when standing and walking. No numbness. No surgeries.  Previous injection approximately 5 - 7 years ago, was helpful.  No blood thinners. No contrast allergy. Has driver.

## 2017-07-27 NOTE — Patient Instructions (Signed)

## 2017-08-09 NOTE — Procedures (Signed)
[name] is a 76 year old gentleman with chronic history of low back pain and radicular pain.  I have actually seen him in the past many years ago with an injection that was very helpful.  He has been followed by Dr. due to and [name], FN-P.  They have requested diagnostic and hopefully therapeutic L5 transforaminal injection on the left.  He is having left radicular leg pain with no new injury.  No numbness no weakness.  Is worse with standing and walking consistent with more of a stenosis pattern.  He has not had updated MRI but does not have any red flag symptoms or complaints.  His case is complicated by type 2 diabetes with peripheral polyneuropathy.  The injection  will be diagnostic and hopefully therapeutic. The patient has failed conservative care including time, medications and activity modification.  Lumbosacral Transforaminal Epidural Steroid Injection - Sub-Pedicular Approach with Fluoroscopic Guidance  Patient: [name]      Date of Birth: [birth year] MRN: [id number] PCP: [name], MD      Visit Date: 07/27/2017   Universal Protocol:    Date/Time: 07/27/2017  Consent Given By: the patient  Position: PRONE  Additional Comments: Vital signs were monitored before and after the procedure. Patient was prepped and draped in the usual sterile fashion. The correct patient, procedure, and site was verified.   Injection Procedure Details:  Procedure Site One Meds Administered:  Meds ordered this encounter  Medications  . lidocaine (PF) (XYLOCAINE) 1 % injection 2 mL  . betamethasone acetate-betamethasone sodium phosphate (CELESTONE) injection 12 mg    Laterality: Left  Location/Site:  L5-S1  Needle size: 22 G  Needle type: Spinal  Needle Placement: Transforaminal  Findings:  -Contrast Used: 1 mL iohexol 180 mg iodine/mL   -Comments: Excellent flow of contrast along the nerve and into the epidural space.  Procedure Details: After squaring off  the end-plates to get a true AP view, the C-arm was positioned so that an oblique view of the foramen as noted above was visualized. The target area is just inferior to the "nose of the scotty dog" or sub pedicular. The soft tissues overlying this structure were infiltrated with 2-3 ml. of 1% Lidocaine without Epinephrine.  The spinal needle was inserted toward the target using a "trajectory" view along the fluoroscope beam.  Under AP and lateral visualization, the needle was advanced so it did not puncture dura and was located close the 6 O'Clock position of the pedical in AP tracterory. Biplanar projections were used to confirm position. Aspiration was confirmed to be negative for CSF and/or blood. A 1-2 ml. volume of Isovue-250 was injected and flow of contrast was noted at each level. Radiographs were obtained for documentation purposes.   After attaining the desired flow of contrast documented above, a 0.5 to 1.0 ml test dose of 0.25% Marcaine was injected into each respective transforaminal space.  The patient was observed for 90 seconds post injection.  After no sensory deficits were reported, and normal lower extremity motor function was noted,   the above injectate was administered so that equal amounts of the injectate were placed at each foramen (level) into the transforaminal epidural space.   Additional Comments:  The patient tolerated the procedure well Dressing: Band-Aid    Post-procedure details: Patient was observed during the procedure. Post-procedure instructions were reviewed.  Patient left the clinic in stable condition.

## 2017-08-24 ENCOUNTER — Telehealth (INDEPENDENT_AMBULATORY_CARE_PROVIDER_SITE_OTHER): Payer: Self-pay | Admitting: Physical Medicine and Rehabilitation

## 2017-08-24 ENCOUNTER — Ambulatory Visit: Payer: Medicare HMO | Admitting: Podiatry

## 2017-08-24 ENCOUNTER — Encounter: Payer: Self-pay | Admitting: Podiatry

## 2017-08-24 DIAGNOSIS — E1149 Type 2 diabetes mellitus with other diabetic neurological complication: Secondary | ICD-10-CM

## 2017-08-24 DIAGNOSIS — B351 Tinea unguium: Secondary | ICD-10-CM

## 2017-08-24 DIAGNOSIS — M79675 Pain in left toe(s): Secondary | ICD-10-CM

## 2017-08-24 DIAGNOSIS — M79674 Pain in right toe(s): Secondary | ICD-10-CM

## 2017-08-24 DIAGNOSIS — L84 Corns and callosities: Secondary | ICD-10-CM | POA: Diagnosis not present

## 2017-08-24 DIAGNOSIS — R234 Changes in skin texture: Secondary | ICD-10-CM | POA: Diagnosis not present

## 2017-08-24 DIAGNOSIS — M5416 Radiculopathy, lumbar region: Secondary | ICD-10-CM

## 2017-08-24 NOTE — Telephone Encounter (Signed)
He really need lspine MRI to understand why it would flip from left to right instead of blindly doing injections. Glad the the left helped.

## 2017-08-25 NOTE — Telephone Encounter (Signed)
Left message for patient to call back and let me know if he wishes to proceed with MRI.

## 2017-08-27 NOTE — Progress Notes (Signed)
Subjective: 76 y.o. returns the office today for painful, elongated, thickened toenails which he cannot trim himself. Denies any redness or drainage around the nails.  He also presents today for pre-ulcerative callus of the right foot.  He states he has noticed the blood spot on the area but denies any opening in denies any drainage of pus.  Denies any surrounding redness or red streaks.  He has no other concerns today.  Denies any acute changes since last appointment and no new complaints today. Denies any systemic complaints such as fevers, chills, nausea, vomiting.   PCP: [name], MD  Objective: AAO 3, NAD DP/PT pulses palpable, CRT less than 3 seconds Protective sensation decreased with Simms Weinstein monofilament Nails hypertrophic, dystrophic, elongated, brittle, discolored 10. There is tenderness overlying the nails 1-5 bilaterally. There is no surrounding erythema or drainage along the nail sites. On the right foot submetatarsal one is a hyperkeratotic lesion.  Upon debridement the area is pre-ulcerative but there is no definitive opening the skin but there was blood underneath the callus.  Continue to monitor the area very closely for any skin breakdown to help prevent any further ulceration. Also on exam today there was evidence of fresh blood present in the fourth interspace in the right foot which he has not noticed.  He states that he start this morning he did not notice it.  There is a superficial fissure present in the right fourth interspace with some macerated tissue. No open lesions or pre-ulcerative lesions are identified. No other areas of tenderness bilateral lower extremities. No overlying edema, erythema, increased warmth. No pain with calf compression, swelling, warmth, erythema.  Assessment: Patient presents with symptomatic onychomycosis; pre-ulcerative lesion right submetatarsal 1, skin fissure right fourth interspace  Plan: -Treatment options including  alternatives, risks, complications were discussed -Nails sharply debrided 10 without complication/bleeding. -Pre-ulcerative lesion right foot was sharply debrided without any complications or bleeding.  Offloading pads were dispensed.  Discussed to monitor very closely for any skin breakdown. -Right fourth interspace was cleaned.  Castellani's paint was applied.  Discussed with him to dry thoroughly between his toes.  No clinical signs of infection continue to monitor. -Discussed daily foot inspection. If there are any changes, to call the office immediately.  -Follow-up as scheduled or sooner if any problems are to arise. In the meantime, encouraged to call the office with any questions, concerns, changes symptoms.  Celesta Gentile, DPM

## 2017-08-29 NOTE — Telephone Encounter (Signed)
Patient called back and left message, but he did not say if he wants [place] to proceed with ordering the MRI. I called him back and left another message for him to call me and let me know if he wants [place] to order this.

## 2017-08-29 NOTE — Telephone Encounter (Signed)
MRI ordered per patient.

## 2017-08-29 NOTE — Addendum Note (Signed)
Addended by: Sherre Scarlet B on: 08/29/2017 03:20 PM   Modules accepted: Orders

## 2017-09-07 ENCOUNTER — Ambulatory Visit: Payer: Medicare HMO | Admitting: Podiatry

## 2017-09-14 ENCOUNTER — Telehealth: Payer: Self-pay | Admitting: Family Medicine

## 2017-09-14 NOTE — Telephone Encounter (Signed)
Copied from [place] [id number]. Topic: Quick Communication - See Telephone Encounter >> Sep 14, 2017  1:33 PM Ether Griffins B wrote: CRM for notification. See Telephone encounter for:  Pt out of amitriptyline 50 mg needing refill he has been out for 2 days. Send to [facility] 09/14/17.

## 2017-09-15 ENCOUNTER — Ambulatory Visit: Payer: Medicare HMO | Admitting: Podiatry

## 2017-09-15 DIAGNOSIS — B351 Tinea unguium: Secondary | ICD-10-CM

## 2017-09-15 DIAGNOSIS — L84 Corns and callosities: Secondary | ICD-10-CM | POA: Diagnosis not present

## 2017-09-15 DIAGNOSIS — M79674 Pain in right toe(s): Secondary | ICD-10-CM

## 2017-09-15 DIAGNOSIS — M79675 Pain in left toe(s): Secondary | ICD-10-CM

## 2017-09-15 DIAGNOSIS — E1149 Type 2 diabetes mellitus with other diabetic neurological complication: Secondary | ICD-10-CM

## 2017-09-18 ENCOUNTER — Inpatient Hospital Stay: Admission: RE | Admit: 2017-09-18 | Payer: Medicare HMO | Source: Ambulatory Visit

## 2017-09-18 ENCOUNTER — Other Ambulatory Visit: Payer: Self-pay

## 2017-09-18 MED ORDER — AMITRIPTYLINE HCL 50 MG PO TABS: 50.0000 mg | ORAL_TABLET | Freq: Every day | ORAL | 3 refills | Status: DC

## 2017-09-20 NOTE — Progress Notes (Signed)
Subjective: 76 y.o. returns the office today for painful, elongated, thickened toenails which he cannot trim himself. Denies any redness or drainage around the nails.  He also presents today for pre-ulcerative callus of the right foot.  He states he does get moisturizer on the callus when he thinks about it.  Denies any drainage or pus or any swelling or redness to his feet.  He has no new concerns today. Denies any acute changes since last appointment and no new complaints today. Denies any systemic complaints such as fevers, chills, nausea, vomiting.   PCP: [name], MD  Objective: AAO 3, NAD DP/PT pulses palpable, CRT less than 3 seconds Protective sensation decreased with Simms Weinstein monofilament Nails hypertrophic, dystrophic, elongated, brittle, discolored 10. There is tenderness overlying the nails 1-5 bilaterally. There is no surrounding erythema or drainage along the nail sites. On the right foot submetatarsal one is a hyperkeratotic lesion.  Upon debridement the area is pre-ulcerative but there is no definitive opening the skin but there was blood underneath the callus.  Continue to monitor the area very closely for any skin breakdown to help prevent any further ulceration. No other open lesions or pre-ulcerative lesions are identified. No other areas of tenderness bilateral lower extremities. No overlying edema, erythema, increased warmth. No pain with calf compression, swelling, warmth, erythema.  Assessment: Patient presents with symptomatic onychomycosis; pre-ulcerative lesion right submetatarsal 1  Plan: -Treatment options including alternatives, risks, complications were discussed -Nails sharply debrided 10 without complication/bleeding. -Pre-ulcerative lesion right foot was sharply debrided without any complications or bleeding.  Offloading pads were dispensed.  Discussed to monitor very closely for any skin breakdown. -Discussed daily foot inspection. If there  are any changes, to call the office immediately.  -Follow-up in 3 months or sooner if any problems are to arise. In the meantime, encouraged to call the office with any questions, concerns, changes symptoms.  Celesta Gentile, DPM

## 2017-09-27 ENCOUNTER — Other Ambulatory Visit: Payer: Self-pay

## 2017-09-27 MED ORDER — AMITRIPTYLINE HCL 50 MG PO TABS: 50.0000 mg | ORAL_TABLET | Freq: Every day | ORAL | 3 refills | Status: DC

## 2017-09-28 ENCOUNTER — Encounter: Payer: Self-pay | Admitting: Family Medicine

## 2017-09-28 ENCOUNTER — Ambulatory Visit: Payer: Medicare HMO | Admitting: Family Medicine

## 2017-09-28 VITALS — BP 128/70 | HR 100 | Temp 97.6°F | Ht 73.0 in | Wt 211.8 lb

## 2017-09-28 DIAGNOSIS — J449 Chronic obstructive pulmonary disease, unspecified: Secondary | ICD-10-CM | POA: Diagnosis not present

## 2017-09-28 DIAGNOSIS — Z1283 Encounter for screening for malignant neoplasm of skin: Secondary | ICD-10-CM | POA: Diagnosis not present

## 2017-09-28 DIAGNOSIS — B3749 Other urogenital candidiasis: Secondary | ICD-10-CM

## 2017-09-28 DIAGNOSIS — E1142 Type 2 diabetes mellitus with diabetic polyneuropathy: Secondary | ICD-10-CM

## 2017-09-28 DIAGNOSIS — I10 Essential (primary) hypertension: Secondary | ICD-10-CM | POA: Diagnosis not present

## 2017-09-28 DIAGNOSIS — K219 Gastro-esophageal reflux disease without esophagitis: Secondary | ICD-10-CM

## 2017-09-28 DIAGNOSIS — E538 Deficiency of other specified B group vitamins: Secondary | ICD-10-CM

## 2017-09-28 MED ORDER — FAMOTIDINE 20 MG PO TABS: 20.0000 mg | ORAL_TABLET | Freq: Two times a day (BID) | ORAL | 3 refills | Status: DC

## 2017-09-28 MED ORDER — CLOTRIMAZOLE-BETAMETHASONE 1-0.05 % EX CREA: 1.0000 "application " | TOPICAL_CREAM | Freq: Two times a day (BID) | CUTANEOUS | 1 refills | Status: DC

## 2017-09-28 NOTE — Patient Instructions (Addendum)
We will call you within a week or two about your referral to dermatology. If you do not hear within 3 weeks, give [place] a call.   Hopefully cream helps the groin  will change to pepcid/famotidine scheduled twice a day and stop prilosec. Let me know if reflux comes back and we can chang eback.   Please stop by lab before you go

## 2017-09-28 NOTE — Progress Notes (Signed)
Subjective:  [name] is a 76 y.o. year old very pleasant male patient who presents for/with See problem oriented charting ROS- chronic back pain.  Gets winded in dusty environments. No chest pain. No hypoglycemia.   Past Medical History-  Patient Active Problem List   Diagnosis Date Noted  . 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 CKD (chronic kidney disease), stage III ([place]) 05/06/2015    Priority: High  . ILD (interstitial lung disease) ([place]) 07/05/2012    Priority: High  . COPD (chronic obstructive pulmonary disease) ([place]) 12/15/2009    Priority: High  . Chronic back pain. Off narcotics 06/05/17 due to negative UDS for opiates x2. Full history 06/12/14. Pain contract signed.  05/02/2007    Priority: High  . DM (diabetes mellitus) type II controlled, neurological manifestation ([place]) 04/06/2007    Priority: High  . Thrombocytopenia ([place]) 12/21/2015    Priority: Medium  . Right foot ulcer ([place]) 10/09/2015    Priority: Medium  . B12 deficiency 08/20/2015    Priority: Medium  . Diabetic polyneuropathy associated with type 2 diabetes mellitus ([place]) 08/07/2015    Priority: Medium  . Fatty liver 11/04/2014    Priority: Medium  . BPH (benign prostatic hyperplasia) 06/20/2007    Priority: Medium  . Depression 05/02/2007    Priority: Medium  . Hyperlipidemia 04/06/2007    Priority: Medium  . Essential hypertension 04/06/2007    Priority: Medium  . Osteoarthritis 04/06/2007    Priority: Medium  . GERD (gastroesophageal reflux disease) 09/30/2017    Priority: Low  . 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 positive 03/29/2016    Priority: Low  . Candidal balanoposthitis 06/20/2014    Priority: Low    Medications- reviewed and updated Current Outpatient Medications  Medication Sig Dispense Refill  . acyclovir (ZOVIRAX) 400 MG tablet TAKE 1 TABLET TWICE DAILY 180 tablet 2  . albuterol (PROVENTIL HFA;VENTOLIN HFA) 108 (90 Base) MCG/ACT inhaler Inhale 2 puffs into the lungs every 6  (six) hours as needed for wheezing or shortness of breath. 1 Inhaler 5  . amitriptyline (ELAVIL) 50 MG tablet Take 1 tablet (50 mg total) by mouth at bedtime. 90 tablet 3  . amLODipine (NORVASC) 10 MG tablet TAKE 1 TABLET EVERY DAY 90 tablet 3  . aspirin 81 MG tablet Take 81 mg by mouth daily.      . Cholecalciferol 1000 UNITS capsule Take 1 capsule (1,000 Units total) by mouth 2 (two) times daily.    . diclofenac sodium (VOLTAREN) 1 % GEL Apply 2 g topically 4 (four) times daily. 100 g 3  . famotidine (PEPCID) 20 MG tablet TAKE 1 TABLET AT BEDTIME AS NEEDED FOR  HEARTBURN 90 tablet 3  . finasteride (PROSCAR) 5 MG tablet TAKE 1 TABLET EVERY DAY 90 tablet 3  . gabapentin (NEURONTIN) 300 MG capsule TAKE 1 CAPSULE TWICE DAILY 180 capsule 1  . metFORMIN (GLUCOPHAGE) 500 MG tablet Take 1 tablet (500 mg total) by mouth 2 (two) times daily with a meal. 60 tablet 3  . Multiple Vitamin (MULTIVITAMIN) capsule Take 1 capsule by mouth daily.      . NONFORMULARY OR COMPOUNDED ITEM [facility]  Peripheral Neuropathy Cream- Bupivacaine 1%, Doxepin 3%, Gabapentin 6%, Pentoxifylline 3%, Topiramate 1% Apply 1-2 grams to affected area 3-4 times daily Qty. 120 gm 3 refills    . omeprazole (PRILOSEC) 20 MG capsule TAKE 1 CAPSULE DAILY 30 TO 60 MINUTES BEFORE FIRST MEAL OF THE DAY 90 capsule 3  .  rosuvastatin (CRESTOR) 10 MG tablet TAKE 1 TABLET EVERY DAY 90 tablet 3  . tamsulosin (FLOMAX) 0.4 MG CAPS capsule TAKE 1 CAPSULE EVERY DAY 90 capsule 1  . TRUEPLUS LANCETS 28G MISC Use to test blood sugars two times daily. Dx: E11.9 200 each 3   No current facility-administered medications for this visit.     Objective: BP 128/70   Pulse 100   Temp 97.6 F (36.4 C) (Oral)   Ht 6\' 1"  (1.854 m)   Wt 211 lb 12.8 oz (96.1 kg)   SpO2 96%   BMI 27.94 kg/m  Gen: NAD, resting comfortably CV: RRR no murmurs rubs or gallops Lungs: CTAB no crackles, wheeze, rhonchi Ext: no edema Skin: warm, dry Appears to be  in pain when rising from chair- moves slowly   Assessment/Plan:  DM (diabetes mellitus) type II controlled, neurological manifestation ([facility]) S: hopefully controlled. On metformin 500mg  BID. Also on gabapentin and amitriptyline for neuropathy CBGs- 175 at night Lab Results  Component Value Date   HGBA1C 7.1 (H) 03/30/2017   HGBA1C 7.5 (H) 12/21/2016   HGBA1C 6.9 (H) 09/19/2016  A/P: update a1c- may need to increase metformin Needs urine microalbumin- cannot pee today Addendum: Lab Results  Component Value Date   HGBA1C 7.7 (H) 09/28/2017  increase AM metformin to 1000mg  BID with follow up 4 months  Essential hypertension S: controlled on amlodipine 10mg  on repeat BP Readings from Last 3 Encounters:  09/28/17 128/70  07/27/17 (!) 144/77  06/29/17 122/78  A/P: We discussed blood pressure goal of <140/90. Continue current meds  COPD (chronic obstructive pulmonary disease) ([facility]) S: rarely uses his albuterol. Usually out in dusty shop and feels some wheeze and SOB- if he takes a 30 minute break gets better A/P: will continue with prn albuterol  B12 deficiency S:  In past was on injections Lab Results  Component Value Date   VITAMINB12 543 03/22/2016  A/P: update b12- at least a year off meds at this point Lab Results  Component Value Date   VITAMINB12 239 09/28/2017  trending down- advised OTC b12 1000 mcg daily. Did send in so would appear on med list Recheck next labs   Candidal balanoposthitis lotrisone prn helps- refilled today  GERD (gastroesophageal reflux disease) S: on prilosec 20mg  twice a day with pepcid prn- ppi likely contributes to decreased b12 leels A/P:will change to pepcid scheduled and stop prilosec   Future Appointments  Date Time Provider [place]  11/17/2017  2:15 PM Trula Slade, DPM [facility]  01/29/2018  1:15 PM [name], MD [facility]   Return in about 4 months (around 01/27/2018) for follow up- or sooner if  needed.  Orders Placed This Encounter  Procedures  . CBC    [place]  . Comprehensive metabolic panel    [place]  . Hemoglobin A1c    [place]  . Vitamin B12  . Ambulatory referral to Dermatology    Referral Priority:   Routine    Referral Type:   Consultation    Referral Reason:   Specialty Services Required    Requested Specialty:   Dermatology    Number of Visits Requested:   1    Meds ordered this encounter  Medications  . clotrimazole-betamethasone (LOTRISONE) cream    Sig: Apply 1 application topically 2 (two) times daily. For 7 days maximum. Stop if worsening symptoms.    Dispense:  45 g    Refill:  1  . famotidine (PEPCID) 20 MG  tablet    Sig: Take 1 tablet (20 mg total) by mouth 2 (two) times daily.    Dispense:  180 tablet    Refill:  3  . Cyanocobalamin 1000 MCG CAPS    Sig: Take 1 capsule by mouth daily.    Dispense:  90 capsule    Refill:  3    Return precautions advised.  [name], MD

## 2017-09-29 ENCOUNTER — Ambulatory Visit
Admission: RE | Admit: 2017-09-29 | Discharge: 2017-09-29 | Disposition: A | Discharge status: Home / Self Care | Payer: Medicare HMO | Source: Ambulatory Visit | Attending: Physical Medicine and Rehabilitation | Admitting: Physical Medicine and Rehabilitation

## 2017-09-29 ENCOUNTER — Telehealth (INDEPENDENT_AMBULATORY_CARE_PROVIDER_SITE_OTHER): Payer: Self-pay | Admitting: Physical Medicine and Rehabilitation

## 2017-09-29 DIAGNOSIS — M48061 Spinal stenosis, lumbar region without neurogenic claudication: Secondary | ICD-10-CM | POA: Diagnosis not present

## 2017-09-29 DIAGNOSIS — M5416 Radiculopathy, lumbar region: Secondary | ICD-10-CM

## 2017-09-29 LAB — COMPREHENSIVE METABOLIC PANEL
ALK PHOS: 91 U/L (ref 39–117)
ALT: 12 U/L (ref 0–53)
AST: 18 U/L (ref 0–37)
Albumin: 4.1 g/dL (ref 3.5–5.2)
BILIRUBIN TOTAL: 0.3 mg/dL (ref 0.2–1.2)
BUN: 13 mg/dL (ref 6–23)
CO2: 24 meq/L (ref 19–32)
CREATININE: 1.17 mg/dL (ref 0.40–1.50)
Calcium: 8.9 mg/dL (ref 8.4–10.5)
Chloride: 103 mEq/L (ref 96–112)
GFR: 64.31 mL/min (ref >60.00)
GLUCOSE: 294 mg/dL — AB (ref 70–99)
Potassium: 4.3 mEq/L (ref 3.5–5.1)
SODIUM: 136 meq/L (ref 135–145)
TOTAL PROTEIN: 6.8 g/dL (ref 6.0–8.3)

## 2017-09-29 LAB — CBC
HCT: 36.5 % — ABNORMAL LOW (ref 39.0–52.0)
HEMOGLOBIN: 12.3 g/dL — AB (ref 13.0–17.0)
MCHC: 33.6 g/dL (ref 30.0–36.0)
MCV: 101.5 fl — ABNORMAL HIGH (ref 78.0–100.0)
Platelets: 119 10*3/uL — ABNORMAL LOW (ref 150.0–400.0)
RBC: 3.59 Mil/uL — ABNORMAL LOW (ref 4.22–5.81)
RDW: 14.2 % (ref 11.5–15.5)
WBC: 6.5 10*3/uL (ref 4.0–10.5)

## 2017-09-29 LAB — HEMOGLOBIN A1C: Hgb A1c MFr Bld: 7.7 % — ABNORMAL HIGH (ref 4.6–6.5)

## 2017-09-29 LAB — VITAMIN B12: Vitamin B-12: 239 pg/mL (ref 211–911)

## 2017-09-29 NOTE — Telephone Encounter (Signed)
Called patient and left a message for him to call back to schedule an MRI review with Dr. Ernestina Patches.

## 2017-09-30 DIAGNOSIS — K219 Gastro-esophageal reflux disease without esophagitis: Secondary | ICD-10-CM | POA: Insufficient documentation

## 2017-09-30 MED ORDER — METFORMIN HCL 1000 MG PO TABS: 1000.0000 mg | ORAL_TABLET | Freq: Two times a day (BID) | ORAL | 3 refills | Status: DC

## 2017-09-30 MED ORDER — CYANOCOBALAMIN 1000 MCG PO CAPS: 1.0000 | ORAL_CAPSULE | Freq: Every day | ORAL | 3 refills | Status: AC

## 2017-09-30 NOTE — Assessment & Plan Note (Signed)
S:  In past was on injections Lab Results  Component Value Date   [id number] 458 03/22/2016  A/P: update b12- at least a year off meds at this point Lab Results  Component Value Date   VITAMINB12 239 09/28/2017  trending down- advised OTC b12 1000 mcg daily. Did send in so would appear on med list Recheck next labs

## 2017-09-30 NOTE — Assessment & Plan Note (Signed)
S: on prilosec 20mg  twice a day with pepcid prn- ppi likely contributes to decreased b12 leels A/P:will change to pepcid scheduled and stop prilosec

## 2017-09-30 NOTE — Assessment & Plan Note (Signed)
S: rarely uses his albuterol. Usually out in dusty shop and feels some wheeze and SOB- if he takes a 30 minute break gets better A/P: will continue with prn albuterol

## 2017-09-30 NOTE — Assessment & Plan Note (Addendum)
S: hopefully controlled. On metformin 500mg  BID. Also on gabapentin and amitriptyline for neuropathy CBGs- 175 at night Lab Results  Component Value Date   HGBA1C 7.1 (H) 03/30/2017   HGBA1C 7.5 (H) 12/21/2016   HGBA1C 6.9 (H) 09/19/2016  A/P: update a1c- may need to increase metformin Needs urine microalbumin- cannot pee today Addendum: Lab Results  Component Value Date   HGBA1C 7.7 (H) 09/28/2017  increase AM metformin to 1000mg  BID with follow up 4 months

## 2017-09-30 NOTE — Assessment & Plan Note (Signed)
S: controlled on amlodipine 10mg  on repeat BP Readings from Last 3 Encounters:  09/28/17 128/70  07/27/17 (!) 144/77  06/29/17 122/78  A/P: We discussed blood pressure goal of <140/90. Continue current meds

## 2017-09-30 NOTE — Assessment & Plan Note (Signed)
lotrisone prn helps- refilled today

## 2017-10-02 ENCOUNTER — Other Ambulatory Visit: Payer: Self-pay

## 2017-10-02 ENCOUNTER — Telehealth: Payer: Self-pay

## 2017-10-02 DIAGNOSIS — D696 Thrombocytopenia, unspecified: Secondary | ICD-10-CM

## 2017-10-02 NOTE — Telephone Encounter (Signed)
PA for amitriptyline started on Cover My Meds.  Awaiting insurance decision.

## 2017-10-04 NOTE — Telephone Encounter (Signed)
Received Prior Authorization approval for amitriptyline (ELAVIL) 50 MG tablet. Authorization is valid through 10/02/2019

## 2017-10-04 NOTE — Telephone Encounter (Signed)
PA for amitriptyline approved.  Patient's pharmacy notified.

## 2017-10-12 ENCOUNTER — Telehealth: Payer: Self-pay | Admitting: Family Medicine

## 2017-10-12 NOTE — Telephone Encounter (Signed)
Pt called to get his lab results. Results given to him with verbal understanding per request. He was told to take his Metformin 1000 mg in the morning and 500 mg in the evening. Pt voiced understanding.

## 2017-10-13 ENCOUNTER — Telehealth (INDEPENDENT_AMBULATORY_CARE_PROVIDER_SITE_OTHER): Payer: Self-pay | Admitting: Physical Medicine and Rehabilitation

## 2017-10-13 NOTE — Telephone Encounter (Signed)
Auto approved on [employer] for codes [phone] and [phone]. See referral.

## 2017-10-16 ENCOUNTER — Telehealth: Payer: Self-pay | Admitting: Family Medicine

## 2017-10-16 NOTE — Telephone Encounter (Signed)
I will re-send referral to Dr. Juel Burrow office, no further action needed. We can use the same initial referral.

## 2017-10-16 NOTE — Telephone Encounter (Signed)
Patient came in office stating he needed a referral for "[facility] [address], [place], [address]". Patient sated he has an appointment already for Thursday 10/19/17 at 10:30 am. Patient stated he needs the referral for insurance purposes. Please advise. Please call patient when referral is placed.

## 2017-10-16 NOTE — Telephone Encounter (Signed)
Referral to Dermatology was placed on 09/28/17. [name] can we use this referral for what he is requesting or do we need to place a new one?

## 2017-10-19 DIAGNOSIS — L570 Actinic keratosis: Secondary | ICD-10-CM | POA: Diagnosis not present

## 2017-10-19 DIAGNOSIS — D225 Melanocytic nevi of trunk: Secondary | ICD-10-CM | POA: Diagnosis not present

## 2017-10-19 DIAGNOSIS — L821 Other seborrheic keratosis: Secondary | ICD-10-CM | POA: Diagnosis not present

## 2017-10-19 DIAGNOSIS — X32XXXA Exposure to sunlight, initial encounter: Secondary | ICD-10-CM | POA: Diagnosis not present

## 2017-10-19 DIAGNOSIS — C4442 Squamous cell carcinoma of skin of scalp and neck: Secondary | ICD-10-CM | POA: Diagnosis not present

## 2017-10-30 ENCOUNTER — Other Ambulatory Visit: Payer: Medicare HMO

## 2017-10-30 DIAGNOSIS — C4442 Squamous cell carcinoma of skin of scalp and neck: Secondary | ICD-10-CM | POA: Diagnosis not present

## 2017-11-01 ENCOUNTER — Other Ambulatory Visit (INDEPENDENT_AMBULATORY_CARE_PROVIDER_SITE_OTHER): Payer: Medicare HMO

## 2017-11-01 DIAGNOSIS — D696 Thrombocytopenia, unspecified: Secondary | ICD-10-CM | POA: Diagnosis not present

## 2017-11-01 LAB — CBC WITH DIFFERENTIAL/PLATELET
BASOS PCT: 0.8 % (ref 0.0–3.0)
Basophils Absolute: 0.1 10*3/uL (ref 0.0–0.1)
EOS ABS: 0.1 10*3/uL (ref 0.0–0.7)
EOS PCT: 1.2 % (ref 0.0–5.0)
HEMATOCRIT: 37.8 % — AB (ref 39.0–52.0)
HEMOGLOBIN: 12.9 g/dL — AB (ref 13.0–17.0)
LYMPHS PCT: 25.7 % (ref 12.0–46.0)
Lymphs Abs: 2.2 10*3/uL (ref 0.7–4.0)
MCHC: 34.1 g/dL (ref 30.0–36.0)
MCV: 99 fl (ref 78.0–100.0)
MONOS PCT: 4.8 % (ref 3.0–12.0)
Monocytes Absolute: 0.4 10*3/uL (ref 0.1–1.0)
Neutro Abs: 5.7 10*3/uL (ref 1.4–7.7)
Neutrophils Relative %: 67.5 % (ref 43.0–77.0)
Platelets: 128 10*3/uL — ABNORMAL LOW (ref 150.0–400.0)
RBC: 3.82 Mil/uL — ABNORMAL LOW (ref 4.22–5.81)
RDW: 13.7 % (ref 11.5–15.5)
WBC: 8.5 10*3/uL (ref 4.0–10.5)

## 2017-11-02 ENCOUNTER — Encounter (INDEPENDENT_AMBULATORY_CARE_PROVIDER_SITE_OTHER): Payer: Self-pay | Admitting: Physical Medicine and Rehabilitation

## 2017-11-02 ENCOUNTER — Ambulatory Visit (INDEPENDENT_AMBULATORY_CARE_PROVIDER_SITE_OTHER): Payer: Medicare HMO | Admitting: Physical Medicine and Rehabilitation

## 2017-11-02 ENCOUNTER — Ambulatory Visit (INDEPENDENT_AMBULATORY_CARE_PROVIDER_SITE_OTHER): Payer: Medicare HMO

## 2017-11-02 ENCOUNTER — Telehealth (INDEPENDENT_AMBULATORY_CARE_PROVIDER_SITE_OTHER): Payer: Self-pay | Admitting: Physical Medicine and Rehabilitation

## 2017-11-02 VITALS — BP 156/90 | HR 108 | Temp 97.7°F

## 2017-11-02 DIAGNOSIS — G8929 Other chronic pain: Secondary | ICD-10-CM | POA: Diagnosis not present

## 2017-11-02 DIAGNOSIS — M545 Low back pain: Secondary | ICD-10-CM

## 2017-11-02 DIAGNOSIS — M48062 Spinal stenosis, lumbar region with neurogenic claudication: Secondary | ICD-10-CM

## 2017-11-02 DIAGNOSIS — M47816 Spondylosis without myelopathy or radiculopathy, lumbar region: Secondary | ICD-10-CM | POA: Diagnosis not present

## 2017-11-02 DIAGNOSIS — M5416 Radiculopathy, lumbar region: Secondary | ICD-10-CM | POA: Diagnosis not present

## 2017-11-02 LAB — PATHOLOGIST SMEAR REVIEW

## 2017-11-02 MED ORDER — METHYLPREDNISOLONE ACETATE 80 MG/ML IJ SUSP
80.0000 mg | Freq: Once | INTRAMUSCULAR | Status: AC
  Administered 2017-11-02: 80 mg

## 2017-11-02 NOTE — Patient Instructions (Signed)

## 2017-11-02 NOTE — Progress Notes (Deleted)
Pt states pain in lower back and in right and left knee. Pt states pain started 20 years ago and pain has gotten worse over the past 10 years. Pt states not taking tylenol makes pain worse, tylenol makes the pain better. Pt states last injection helped and lasted for awhile. +Driver, -BT, -Dye Allergies.

## 2017-11-03 NOTE — Procedures (Signed)
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  He comes in today for MRI review and possible injection.  Prior L5 transforaminal injection gave him quite a bit of relief of his left radicular leg pain and now has predominantly upper lumbar pain worse with standing and walking.  MRI was reviewed with the patient and is reviewed below.  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  This is in the general area where he hurts more than the sacral area.  He does have findings at L5-S1 as well with moderate stenosis.  We are going to complete a diagnostic and hopefully therapeutic left L3-4 interlaminar epidural injection.  Depending on relief would look at facet joint blocks of the upper lumbar region.  Lumbar Epidural Steroid Injection - Interlaminar Approach with Fluoroscopic Guidance  Patient: [name]      Date of Birth: 07-09-1941 MRN: [id number] PCP: [name], MD      Visit Date: 11/02/2017   Universal Protocol:     Consent Given By: the patient  Position: PRONE  Additional Comments: Vital signs were monitored before and after the procedure. Patient was prepped and draped in the usual sterile fashion. The correct patient, procedure, and site was verified.   Injection Procedure Details:  Procedure Site One Meds Administered:  Meds ordered this encounter  Medications  . methylPREDNISolone acetate (DEPO-MEDROL) injection 80 mg     Laterality: Left  Location/Site:  L3-L4  Needle size: 20 G  Needle type: Tuohy  Needle Placement: Paramedian epidural  Findings:   -Comments: Excellent flow of contrast into the epidural space.  Procedure Details: Using a paramedian approach from the side mentioned above, the region overlying the inferior lamina was localized under fluoroscopic visualization and the soft tissues overlying this structure  were infiltrated with 4 ml. of 1% Lidocaine without Epinephrine. The Tuohy needle was inserted into the epidural space using a paramedian approach.   The epidural space was localized using loss of resistance along with lateral and bi-planar fluoroscopic views.  After negative aspirate for air, blood, and CSF, a 2 ml. volume of Isovue-250 was injected into the epidural space and the flow of contrast was observed. Radiographs were obtained for documentation purposes.    The injectate was administered into the level noted above.   Additional Comments:  The patient tolerated the procedure well No complications occurred Dressing: Band-Aid    Post-procedure details: Patient was observed during the procedure. Post-procedure instructions were reviewed.  Patient left the clinic in stable condition.  Pertinent Imaging: MRI LUMBAR SPINE WITHOUT CONTRAST  TECHNIQUE: Multiplanar, multisequence MR imaging of the lumbar spine was performed. No intravenous contrast was administered.  COMPARISON:  02/14/2008.  FINDINGS: Segmentation:  Standard.  Alignment: Slight retrolisthesis L2-3, in conjunction with asymmetric osteophytic spurring from inferior L2.  Vertebrae: Stable chronic changes of hemangiomata, and endplate degenerative change throughout lumbar spine. No worrisome osseous lesion or signs of discitis.  Conus medullaris and cauda equina: Conus extends to the L1 level. Conus and cauda equina appear normal.  Paraspinal and other soft tissues: Renal cystic disease, incompletely evaluated.  Disc levels:  L1-L2:  Annular bulge.  Mild facet arthropathy.  No impingement.  L2-L3: Moderate stenosis related to disc space narrowing, osseous spurring, annular bulge, and posterior element hypertrophy. LEFT greater than RIGHT subarticular zone narrowing affecting the L3 nerve roots. BILATERAL foraminal narrowing, not  clearly compressive.  L3-L4: Mild disc space narrowing.  Annular bulge. Posterior element hypertrophy. Moderate stenosis. BILATERAL L4 nerve root impingement.  L4-L5: Disc space narrowing. Central protrusion. Osseous spurring. Facet arthropathy. RIGHT greater than LEFT L5 nerve root impingement. BILATERAL foraminal narrowing, not clearly compressive.  L5-S1: Disc space narrowing. Central protrusion. Posterior element hypertrophy. Moderate stenosis. BILATERAL RIGHT greater than LEFT L5 and S1 nerve root impingement.  IMPRESSION: Multilevel spondylosis as described. Potentially symptomatic LEFT leg symptomatology most likely to derive from pathology at the L2-3 level. See discussion above.   Electronically Signed   By: Staci Righter M.D.   On: 09/29/2017 17:19

## 2017-11-17 ENCOUNTER — Ambulatory Visit: Payer: Medicare HMO | Admitting: Podiatry

## 2017-11-20 NOTE — Telephone Encounter (Signed)
Closing encounter

## 2017-12-19 ENCOUNTER — Other Ambulatory Visit: Payer: Self-pay | Admitting: Family Medicine

## 2017-12-25 DIAGNOSIS — L570 Actinic keratosis: Secondary | ICD-10-CM | POA: Diagnosis not present

## 2017-12-25 DIAGNOSIS — Z08 Encounter for follow-up examination after completed treatment for malignant neoplasm: Secondary | ICD-10-CM | POA: Diagnosis not present

## 2017-12-25 DIAGNOSIS — L820 Inflamed seborrheic keratosis: Secondary | ICD-10-CM | POA: Diagnosis not present

## 2017-12-25 DIAGNOSIS — X32XXXD Exposure to sunlight, subsequent encounter: Secondary | ICD-10-CM | POA: Diagnosis not present

## 2017-12-25 DIAGNOSIS — Z85828 Personal history of other malignant neoplasm of skin: Secondary | ICD-10-CM | POA: Diagnosis not present

## 2017-12-26 ENCOUNTER — Other Ambulatory Visit: Payer: Self-pay | Admitting: Family Medicine

## 2017-12-28 ENCOUNTER — Ambulatory Visit: Payer: Medicare HMO | Admitting: Podiatry

## 2018-01-03 DIAGNOSIS — H52203 Unspecified astigmatism, bilateral: Secondary | ICD-10-CM | POA: Diagnosis not present

## 2018-01-03 DIAGNOSIS — E119 Type 2 diabetes mellitus without complications: Secondary | ICD-10-CM | POA: Diagnosis not present

## 2018-01-03 DIAGNOSIS — H524 Presbyopia: Secondary | ICD-10-CM | POA: Diagnosis not present

## 2018-01-03 DIAGNOSIS — Z961 Presence of intraocular lens: Secondary | ICD-10-CM | POA: Diagnosis not present

## 2018-01-03 DIAGNOSIS — Z7984 Long term (current) use of oral hypoglycemic drugs: Secondary | ICD-10-CM | POA: Diagnosis not present

## 2018-01-03 DIAGNOSIS — Z947 Corneal transplant status: Secondary | ICD-10-CM | POA: Diagnosis not present

## 2018-01-05 ENCOUNTER — Encounter: Payer: Self-pay | Admitting: Podiatry

## 2018-01-05 ENCOUNTER — Ambulatory Visit: Payer: Medicare HMO | Admitting: Podiatry

## 2018-01-05 DIAGNOSIS — L84 Corns and callosities: Secondary | ICD-10-CM

## 2018-01-05 DIAGNOSIS — M79675 Pain in left toe(s): Secondary | ICD-10-CM | POA: Diagnosis not present

## 2018-01-05 DIAGNOSIS — E1149 Type 2 diabetes mellitus with other diabetic neurological complication: Secondary | ICD-10-CM

## 2018-01-05 DIAGNOSIS — M79674 Pain in right toe(s): Secondary | ICD-10-CM

## 2018-01-05 DIAGNOSIS — B351 Tinea unguium: Secondary | ICD-10-CM

## 2018-01-05 NOTE — Progress Notes (Signed)
Subjective: 77 y.o. returns the office today for painful, elongated, thickened toenails which he cannot trim himself. Denies any redness or drainage around the nails.  He also has a pre-ulcerative callus of the right foot.  He states he does get moisturizer on the callus when he thinks about it.  Denies any drainage or pus or any swelling or redness to his feet.  He has no new concerns today.   PCP: [name], MD  Objective: AAO 3, NAD DP/PT pulses palpable, CRT less than 3 seconds Protective sensation decreased with Simms Weinstein monofilament Nails hypertrophic, dystrophic, elongated, brittle, discolored 10. There is tenderness overlying the nails 1-5 bilaterally. There is no surrounding erythema or drainage along the nail sites. On the right foot submetatarsal one is a hyperkeratotic lesion.  Upon debridement the area is pre-ulcerative but there is no definitive opening the skin but there was blood underneath the callus.  Continue to monitor the area very closely for any skin breakdown to help prevent any further ulceration. No other open lesions or pre-ulcerative lesions are identified. No other areas of tenderness bilateral lower extremities. No overlying edema, erythema, increased warmth. No pain with calf compression, swelling, warmth, erythema.  Assessment: Patient presents with symptomatic onychomycosis; pre-ulcerative lesion right submetatarsal 1  Plan: -Treatment options including alternatives, risks, complications were discussed -Nails sharply debrided 10 without complication/bleeding. -Pre-ulcerative lesion right foot was sharply debrided without any complications or bleeding.  Offloading pads were dispensed.  Discussed to monitor very closely for any skin breakdown. -Discussed daily foot inspection. If there are any changes, to call the office immediately.  -Follow-up in 3 months or sooner if any problems are to arise. In the meantime, encouraged to call the office  with any questions, concerns, changes symptoms.  Celesta Gentile, DPM

## 2018-01-15 ENCOUNTER — Other Ambulatory Visit: Payer: Self-pay | Admitting: Family Medicine

## 2018-01-15 DIAGNOSIS — I159 Secondary hypertension, unspecified: Secondary | ICD-10-CM

## 2018-01-24 LAB — HM DIABETES EYE EXAM

## 2018-01-29 ENCOUNTER — Ambulatory Visit (INDEPENDENT_AMBULATORY_CARE_PROVIDER_SITE_OTHER): Payer: Medicare HMO | Admitting: Family Medicine

## 2018-01-29 ENCOUNTER — Encounter: Payer: Self-pay | Admitting: Family Medicine

## 2018-01-29 ENCOUNTER — Telehealth: Payer: Self-pay | Admitting: Hematology and Oncology

## 2018-01-29 VITALS — BP 136/70 | HR 98 | Temp 98.5°F | Ht 73.0 in | Wt 217.8 lb

## 2018-01-29 DIAGNOSIS — J449 Chronic obstructive pulmonary disease, unspecified: Secondary | ICD-10-CM

## 2018-01-29 DIAGNOSIS — D696 Thrombocytopenia, unspecified: Secondary | ICD-10-CM | POA: Diagnosis not present

## 2018-01-29 DIAGNOSIS — K219 Gastro-esophageal reflux disease without esophagitis: Secondary | ICD-10-CM

## 2018-01-29 DIAGNOSIS — E1142 Type 2 diabetes mellitus with diabetic polyneuropathy: Secondary | ICD-10-CM

## 2018-01-29 DIAGNOSIS — R011 Cardiac murmur, unspecified: Secondary | ICD-10-CM

## 2018-01-29 DIAGNOSIS — I10 Essential (primary) hypertension: Secondary | ICD-10-CM | POA: Diagnosis not present

## 2018-01-29 LAB — MICROALBUMIN / CREATININE URINE RATIO
Creatinine,U: 126.2 mg/dL
MICROALB/CREAT RATIO: 47.8 mg/g — AB (ref 0.0–30.0)
Microalb, Ur: 60.3 mg/dL — ABNORMAL HIGH (ref 0.0–1.9)

## 2018-01-29 LAB — POCT GLYCOSYLATED HEMOGLOBIN (HGB A1C): HEMOGLOBIN A1C: 6.9

## 2018-01-29 NOTE — Assessment & Plan Note (Addendum)
With continued thrombocytopenia as well as mild anemia (that dipped down decently 2 visits ago then improved again, I discussed with patient potential hematology referral.  Given his overall fatigue he would like to see them for their opinion.  I do suspect the anemia may be related to his chronic kidney disease stage III.  I did consider taking him off aspirin-but he wants to remain on asa with stroke and heart history in family.

## 2018-01-29 NOTE — Assessment & Plan Note (Signed)
S: poorly controlled on metformin 500 mg twice daily last visit.  We increased the medication to 1000 mg in the a.m. and 500 mg in the p.m.  A1c is 6.9 today so significant improvement. CBGs- denies low blood sugars Lab Results  Component Value Date   HGBA1C 6.9 01/29/2018   HGBA1C 7.7 (H) 09/28/2017   HGBA1C 7.1 (H) 03/30/2017   A/P: continue metformin 1000mg  AM, 500mg  PM- doing much better on this

## 2018-01-29 NOTE — Progress Notes (Signed)
Subjective:  [name] is a 77 y.o. year old very pleasant male patient who presents for/with See problem oriented charting ROS- reports fatigue. Shortness of breath at times but stable. Edema slightly worse. No chest pain reported. Does have knee and back pain   Past Medical History-  Patient Active Problem List   Diagnosis Date Noted  . 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 CKD (chronic kidney disease), stage III ([place]) 05/06/2015    Priority: High  . ILD (interstitial lung disease) ([place]) 07/05/2012    Priority: High  . COPD (chronic obstructive pulmonary disease) ([place]) 12/15/2009    Priority: High  . Chronic back pain. Off narcotics 06/05/17 due to negative UDS for opiates x2. Full history 06/12/14. Pain contract signed.  05/02/2007    Priority: High  . DM (diabetes mellitus) type II controlled, neurological manifestation ([place]) 04/06/2007    Priority: High  . Thrombocytopenia ([place]) 12/21/2015    Priority: Medium  . B12 deficiency 08/20/2015    Priority: Medium  . Diabetic polyneuropathy associated with type 2 diabetes mellitus ([place]) 08/07/2015    Priority: Medium  . Fatty liver 11/04/2014    Priority: Medium  . BPH (benign prostatic hyperplasia) 06/20/2007    Priority: Medium  . Depression 05/02/2007    Priority: Medium  . Hyperlipidemia 04/06/2007    Priority: Medium  . Essential hypertension 04/06/2007    Priority: Medium  . Osteoarthritis 04/06/2007    Priority: Medium  . GERD (gastroesophageal reflux disease) 09/30/2017    Priority: Low  . 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 positive 03/29/2016    Priority: Low  . Candidal balanoposthitis 06/20/2014    Priority: Low    Medications- reviewed and updated Current Outpatient Medications  Medication Sig Dispense Refill  . acyclovir (ZOVIRAX) 400 MG tablet TAKE 1 TABLET TWICE DAILY 180 tablet 2  . albuterol (PROVENTIL HFA;VENTOLIN HFA) 108 (90 Base) MCG/ACT inhaler Inhale 2 puffs into the lungs every 6 (six) hours as  needed for wheezing or shortness of breath. 1 Inhaler 5  . amitriptyline (ELAVIL) 50 MG tablet Take 1 tablet (50 mg total) by mouth at bedtime. 90 tablet 3  . amLODipine (NORVASC) 10 MG tablet TAKE 1 TABLET EVERY DAY 90 tablet 3  . aspirin 81 MG tablet Take 81 mg by mouth daily.      . Cholecalciferol 1000 UNITS capsule Take 1 capsule (1,000 Units total) by mouth 2 (two) times daily.    . clotrimazole-betamethasone (LOTRISONE) cream Apply 1 application topically 2 (two) times daily. For 7 days maximum. Stop if worsening symptoms. 45 g 1  . diclofenac sodium (VOLTAREN) 1 % GEL Apply 2 g topically 4 (four) times daily. 100 g 3  . famotidine (PEPCID) 20 MG tablet Take 1 tablet (20 mg total) by mouth 2 (two) times daily. 180 tablet 3  . finasteride (PROSCAR) 5 MG tablet TAKE 1 TABLET EVERY DAY 90 tablet 3  . gabapentin (NEURONTIN) 300 MG capsule TAKE 1 CAPSULE TWICE DAILY 180 capsule 1  . metFORMIN (GLUCOPHAGE) 1000 MG tablet Take 1 tablet (1,000 mg total) by mouth 2 (two) times daily with a meal. 180 tablet 3  . metFORMIN (GLUCOPHAGE) 500 MG tablet TAKE 1 TABLET EVERY DAY WITH BREAKFAST 90 tablet 3  . Multiple Vitamin (MULTIVITAMIN) capsule Take 1 capsule by mouth daily.      . NONFORMULARY OR COMPOUNDED ITEM [facility]  Peripheral Neuropathy Cream- Bupivacaine 1%, Doxepin 3%, Gabapentin 6%, Pentoxifylline 3%, Topiramate 1% Apply 1-2 grams  to affected area 3-4 times daily Qty. 120 gm 3 refills    . rosuvastatin (CRESTOR) 10 MG tablet TAKE 1 TABLET EVERY DAY 90 tablet 3  . tamsulosin (FLOMAX) 0.4 MG CAPS capsule TAKE 1 CAPSULE EVERY DAY 90 capsule 1  . TRUEPLUS LANCETS 28G MISC Use to test blood sugars two times daily. Dx: E11.9 200 each 3   No current facility-administered medications for this visit.     Objective: BP 136/70   Pulse 98   Temp 98.5 F (36.9 C) (Oral)   Ht 6\' 1"  (1.854 m)   Wt 217 lb 12.8 oz (98.8 kg)   SpO2 96%   BMI 28.74 kg/m  Gen: NAD, resting  comfortably CV: RRR faint systolic murmur  lungs: CTAB no crackles, wheeze, rhonchi Abdomen: soft/nontender/nondistended/normal bowel sounds. No hepatosplenomegaly.  Ext: 1+ edema  Skin: warm, dry  Diabetic Foot Exam - Simple   Simple Foot Form Diabetic Foot exam was performed with the following findings:  Yes 01/29/2018  1:53 PM  Visual Inspection No deformities, no ulcerations, no other skin breakdown bilaterally:  Yes Sensation Testing See comments:  Yes Pulse Check Posterior Tibialis and Dorsalis pulse intact bilaterally:  Yes Comments Weak pulses but palpable. No sensation to monofilament.     Assessment/Plan:  Other notes: 1. Knee and back injections have not helped him that much.  He is disappointed by this   Murmur-faint systolic murmur noted today.  He also has 1+ edema.  This could be due to his amlodipine.  With his reported fatigue as well though we will get an echocardiogram.  Possible aortic sclerosis or stenosis.   DM (diabetes mellitus) type II controlled, neurological manifestation ([place]) S: poorly controlled on metformin 500 mg twice daily last visit.  We increased the medication to 1000 mg in the a.m. and 500 mg in the p.m.  A1c is 6.9 today so significant improvement. CBGs- denies low blood sugars Lab Results  Component Value Date   HGBA1C 6.9 01/29/2018   HGBA1C 7.7 (H) 09/28/2017   HGBA1C 7.1 (H) 03/30/2017   A/P: continue metformin 1000mg  AM, 500mg  PM- doing much better on this  Essential hypertension S: controlled on amlodipine 10 mg BP Readings from Last 3 Encounters:  01/29/18 (!) 148/86  11/02/17 (!) 156/90  09/28/17 128/70  A/P: We discussed blood pressure goal of <140/90. Continue current meds  GERD (gastroesophageal reflux disease) S: Last visit we tried to change to Pepcid twice a day and stop Prilosec 20 mg twice daily. Was getting low b12 on prilosec- hopeful better. He states reflux is controlled A/P: continue pepcid twice a day.  Consider b12 with next labs  COPD (chronic obstructive pulmonary disease) ([place]) Stable from last visit.  Rarely using albuterol.  He has not been out in his shop as much where it is dusty and he has had less wheezing and shortness of breath being out of that environment.  Continue as needed albuterol  Thrombocytopenia ([place]) With continued thrombocytopenia as well as mild anemia (that dipped down decently 2 visits ago then improved again, I discussed with patient potential hematology referral.  Given his overall fatigue he would like to see them for their opinion.  I do suspect the anemia may be related to his chronic kidney disease stage III.  I did consider taking him off aspirin-but he wants to remain on asa with stroke and heart history in family.    Future Appointments  Date Time Provider [place]  04/17/2018  1:15 PM  Trula Slade, DPM [facility]   Return in about 4 months (around 05/31/2018) for follow up- or sooner if needed.  Lab/Order associations: Controlled type 2 diabetes mellitus with diabetic polyneuropathy, without long-term current use of insulin ([place]) - Plan: Microalbumin / creatinine urine ratio, POCT glycosylated hemoglobin (Hb A1C)  Thrombocytopenia ([place]), Chronic - Plan: Ambulatory referral to Hematology  Murmur - Plan: ECHOCARDIOGRAM COMPLETE  Return precautions advised.  [name], MD

## 2018-01-29 NOTE — Assessment & Plan Note (Signed)
S: controlled on amlodipine 10 mg BP Readings from Last 3 Encounters:  01/29/18 (!) 148/86  11/02/17 (!) 156/90  09/28/17 128/70  A/P: We discussed blood pressure goal of <140/90. Continue current meds

## 2018-01-29 NOTE — Telephone Encounter (Signed)
Left message for patient regarding appointment date/time/location/phone number

## 2018-01-29 NOTE — Assessment & Plan Note (Signed)
Stable from last visit.  Rarely using albuterol.  He has not been out in his shop as much where it is dusty and he has had less wheezing and shortness of breath being out of that environment.  Continue as needed albuterol

## 2018-01-29 NOTE — Assessment & Plan Note (Signed)
S: Last visit we tried to change to Pepcid twice a day and stop Prilosec 20 mg twice daily. Was getting low b12 on prilosec- hopeful better. He states reflux is controlled A/P: continue pepcid twice a day. Consider b12 with next labs

## 2018-01-29 NOTE — Patient Instructions (Addendum)
Health Maintenance Due  Topic Date Due  . OPHTHALMOLOGY EXAM- Patient recently had it done. Our team will call and get records. 06/13/2015  . TETANUS/TDAP-Informed patient to have it done at his pharmacy and let us know when this is done 06/10/2016  . URINE MICROALBUMIN -Today at office visit 11/30/2016  . FOOT EXAM -Today at Office Visit 09/19/2017   We will call you within a week or two about your referral to hematology as well as for the heart ultrasound given your slight murmur. If you do not hear within 3 weeks, give [place] a call.

## 2018-01-30 ENCOUNTER — Encounter: Payer: Self-pay | Admitting: Family Medicine

## 2018-01-30 ENCOUNTER — Other Ambulatory Visit: Payer: Self-pay

## 2018-01-30 ENCOUNTER — Telehealth: Payer: Self-pay

## 2018-01-30 DIAGNOSIS — I10 Essential (primary) hypertension: Secondary | ICD-10-CM

## 2018-01-30 MED ORDER — LISINOPRIL 2.5 MG PO TABS: 2.5000 mg | ORAL_TABLET | Freq: Every day | ORAL | 5 refills | Status: DC

## 2018-01-30 NOTE — Telephone Encounter (Signed)
Called patient and left a message to call office back.  

## 2018-01-31 ENCOUNTER — Encounter: Payer: Self-pay | Admitting: Hematology and Oncology

## 2018-01-31 ENCOUNTER — Telehealth: Payer: Self-pay | Admitting: Hematology and Oncology

## 2018-01-31 NOTE — Telephone Encounter (Signed)
Pt's appt has been rescheduled to 5/7 at 2pm to see Dr. Lebron Conners. Pt voiced understanding. Letter mailed.

## 2018-02-05 ENCOUNTER — Ambulatory Visit (HOSPITAL_COMMUNITY): Payer: Medicare HMO | Attending: Internal Medicine

## 2018-02-05 ENCOUNTER — Encounter (INDEPENDENT_AMBULATORY_CARE_PROVIDER_SITE_OTHER): Payer: Self-pay

## 2018-02-05 ENCOUNTER — Other Ambulatory Visit: Payer: Self-pay

## 2018-02-05 VITALS — BP 136/70

## 2018-02-05 DIAGNOSIS — R011 Cardiac murmur, unspecified: Secondary | ICD-10-CM

## 2018-02-05 DIAGNOSIS — N189 Chronic kidney disease, unspecified: Secondary | ICD-10-CM | POA: Diagnosis not present

## 2018-02-05 DIAGNOSIS — J449 Chronic obstructive pulmonary disease, unspecified: Secondary | ICD-10-CM | POA: Insufficient documentation

## 2018-02-05 DIAGNOSIS — E1122 Type 2 diabetes mellitus with diabetic chronic kidney disease: Secondary | ICD-10-CM | POA: Diagnosis not present

## 2018-02-05 DIAGNOSIS — I340 Nonrheumatic mitral (valve) insufficiency: Secondary | ICD-10-CM | POA: Diagnosis not present

## 2018-02-05 MED ORDER — PERFLUTREN LIPID MICROSPHERE
1.0000 mL | INTRAVENOUS | Status: AC | PRN
Start: 2018-02-05 — End: 2018-02-05
  Administered 2018-02-05: 1 mL via INTRAVENOUS

## 2018-02-13 ENCOUNTER — Encounter: Payer: Self-pay | Admitting: Hematology and Oncology

## 2018-02-13 ENCOUNTER — Telehealth: Payer: Self-pay | Admitting: Family Medicine

## 2018-02-13 ENCOUNTER — Inpatient Hospital Stay: Payer: Medicare HMO | Attending: Hematology and Oncology | Admitting: Hematology and Oncology

## 2018-02-13 ENCOUNTER — Inpatient Hospital Stay: Payer: Medicare HMO

## 2018-02-13 ENCOUNTER — Telehealth: Payer: Self-pay | Admitting: Hematology and Oncology

## 2018-02-13 VITALS — BP 153/81 | HR 109 | Temp 98.1°F | Resp 20 | Ht 73.0 in | Wt 208.2 lb

## 2018-02-13 DIAGNOSIS — I129 Hypertensive chronic kidney disease with stage 1 through stage 4 chronic kidney disease, or unspecified chronic kidney disease: Secondary | ICD-10-CM | POA: Diagnosis not present

## 2018-02-13 DIAGNOSIS — D696 Thrombocytopenia, unspecified: Secondary | ICD-10-CM

## 2018-02-13 DIAGNOSIS — Z79899 Other long term (current) drug therapy: Secondary | ICD-10-CM | POA: Insufficient documentation

## 2018-02-13 DIAGNOSIS — Z7982 Long term (current) use of aspirin: Secondary | ICD-10-CM | POA: Diagnosis not present

## 2018-02-13 DIAGNOSIS — Z7984 Long term (current) use of oral hypoglycemic drugs: Secondary | ICD-10-CM | POA: Insufficient documentation

## 2018-02-13 DIAGNOSIS — Z87891 Personal history of nicotine dependence: Secondary | ICD-10-CM | POA: Insufficient documentation

## 2018-02-13 DIAGNOSIS — N400 Enlarged prostate without lower urinary tract symptoms: Secondary | ICD-10-CM | POA: Insufficient documentation

## 2018-02-13 DIAGNOSIS — E538 Deficiency of other specified B group vitamins: Secondary | ICD-10-CM | POA: Diagnosis not present

## 2018-02-13 DIAGNOSIS — K760 Fatty (change of) liver, not elsewhere classified: Secondary | ICD-10-CM | POA: Diagnosis not present

## 2018-02-13 DIAGNOSIS — G4733 Obstructive sleep apnea (adult) (pediatric): Secondary | ICD-10-CM | POA: Insufficient documentation

## 2018-02-13 DIAGNOSIS — E119 Type 2 diabetes mellitus without complications: Secondary | ICD-10-CM | POA: Diagnosis not present

## 2018-02-13 DIAGNOSIS — J449 Chronic obstructive pulmonary disease, unspecified: Secondary | ICD-10-CM | POA: Insufficient documentation

## 2018-02-13 DIAGNOSIS — E785 Hyperlipidemia, unspecified: Secondary | ICD-10-CM | POA: Diagnosis not present

## 2018-02-13 DIAGNOSIS — F329 Major depressive disorder, single episode, unspecified: Secondary | ICD-10-CM | POA: Diagnosis not present

## 2018-02-13 DIAGNOSIS — D649 Anemia, unspecified: Secondary | ICD-10-CM

## 2018-02-13 LAB — CBC WITH DIFFERENTIAL (CANCER CENTER ONLY)
Basophils Absolute: 0 10*3/uL (ref 0.0–0.1)
Basophils Relative: 1 %
Eosinophils Absolute: 0.1 10*3/uL (ref 0.0–0.5)
Eosinophils Relative: 1 %
HCT: 34.7 % — ABNORMAL LOW (ref 38.4–49.9)
HEMOGLOBIN: 11.6 g/dL — AB (ref 13.0–17.1)
LYMPHS ABS: 1.8 10*3/uL (ref 0.9–3.3)
LYMPHS PCT: 34 %
MCH: 33.6 pg — AB (ref 27.2–33.4)
MCHC: 33.4 g/dL (ref 32.0–36.0)
MCV: 100.6 fL — AB (ref 79.3–98.0)
Monocytes Absolute: 0.3 10*3/uL (ref 0.1–0.9)
Monocytes Relative: 6 %
NEUTROS PCT: 58 %
Neutro Abs: 3.1 10*3/uL (ref 1.5–6.5)
Platelet Count: 118 10*3/uL — ABNORMAL LOW (ref 140–400)
RBC: 3.45 MIL/uL — AB (ref 4.20–5.82)
RDW: 14.1 % (ref 11.0–14.6)
WBC Count: 5.3 10*3/uL (ref 4.0–10.3)

## 2018-02-13 LAB — CMP (CANCER CENTER ONLY)
ALK PHOS: 92 U/L (ref 40–150)
ALT: 19 U/L (ref 0–55)
AST: 28 U/L (ref 5–34)
Albumin: 3.9 g/dL (ref 3.5–5.0)
Anion gap: 6 (ref 3–11)
BUN: 12 mg/dL (ref 7–26)
CALCIUM: 9.7 mg/dL (ref 8.4–10.4)
CO2: 27 mmol/L (ref 22–29)
CREATININE: 1.17 mg/dL (ref 0.70–1.30)
Chloride: 107 mmol/L (ref 98–109)
GFR, Estimated: 58 mL/min — ABNORMAL LOW (ref >60)
Glucose, Bld: 131 mg/dL (ref 70–140)
Potassium: 4.7 mmol/L (ref 3.5–5.1)
Sodium: 140 mmol/L (ref 136–145)
Total Bilirubin: 0.3 mg/dL (ref 0.2–1.2)
Total Protein: 6.9 g/dL (ref 6.4–8.3)

## 2018-02-13 LAB — VITAMIN B12: VITAMIN B 12: 619 pg/mL (ref 180–914)

## 2018-02-13 LAB — LACTATE DEHYDROGENASE: LDH: 111 U/L — ABNORMAL LOW (ref 125–245)

## 2018-02-13 LAB — FOLATE: Folate: 30 ng/mL (ref >5.9)

## 2018-02-13 NOTE — Telephone Encounter (Signed)
Called patient and left him a voicemail message asking for a return phone call to review his echocardiogram

## 2018-02-13 NOTE — Telephone Encounter (Signed)
Scheduled appt per 5/7 los - Gave patient AVS los.

## 2018-02-13 NOTE — Telephone Encounter (Signed)
Copied from [place] [phone]. Topic: General - Other >> Feb 13, 2018  9:45 AM Yvette Rack wrote: Reason for CRM: pt would like results of his heart test

## 2018-02-13 NOTE — Telephone Encounter (Signed)
See note

## 2018-02-14 ENCOUNTER — Telehealth: Payer: Self-pay | Admitting: Family Medicine

## 2018-02-14 ENCOUNTER — Telehealth: Payer: Self-pay

## 2018-02-14 LAB — FERRITIN: Ferritin: 114 ng/mL (ref 22–316)

## 2018-02-14 LAB — FOLLICLE STIMULATING HORMONE: FSH: 7.2 m[IU]/mL (ref 1.5–12.4)

## 2018-02-14 LAB — HCV COMMENT:

## 2018-02-14 LAB — IRON AND TIBC
Iron: 88 ug/dL (ref 42–163)
Saturation Ratios: 35 % — ABNORMAL LOW (ref 42–163)
TIBC: 254 ug/dL (ref 202–409)
UIBC: 166 ug/dL

## 2018-02-14 LAB — THYROID PANEL WITH TSH
Free Thyroxine Index: 1.6 (ref 1.2–4.9)
T3 Uptake Ratio: 30 % (ref 24–39)
T4, Total: 5.2 ug/dL (ref 4.5–12.0)
TSH: 0.824 u[IU]/mL (ref 0.450–4.500)

## 2018-02-14 LAB — TESTOSTERONE, FREE, TOTAL, SHBG
SEX HORMONE BINDING: 128.2 nmol/L — AB (ref 19.3–76.4)
TESTOSTERONE FREE: 0.6 pg/mL — AB (ref 6.6–18.1)
TESTOSTERONE: 185 ng/dL — AB (ref 264–916)

## 2018-02-14 LAB — HEPATITIS C ANTIBODY (REFLEX): HCV Ab: <0.1 s/co ratio (ref 0.0–0.9)

## 2018-02-14 LAB — LUTEINIZING HORMONE: LH: 13.2 m[IU]/mL — AB (ref 1.7–8.6)

## 2018-02-14 NOTE — Telephone Encounter (Signed)
Copied from [place] [id number]. Topic: Quick Communication - Lab Results >> Feb 14, 2018  9:26 AM Everrett Coombe, CMA wrote: Called patient to inform them of 02/05/2018 lab results. When patient returns call, triage nurse may disclose results.   [phone]

## 2018-02-14 NOTE — Telephone Encounter (Signed)
See result notes. 

## 2018-02-14 NOTE — Telephone Encounter (Signed)
Called patient and left a message to call office back.  

## 2018-02-15 LAB — METHYLMALONIC ACID, SERUM: METHYLMALONIC ACID, QUANTITATIVE: 393 nmol/L — AB (ref 0–378)

## 2018-02-19 ENCOUNTER — Telehealth: Payer: Self-pay | Admitting: Hematology and Oncology

## 2018-02-19 ENCOUNTER — Inpatient Hospital Stay (HOSPITAL_BASED_OUTPATIENT_CLINIC_OR_DEPARTMENT_OTHER): Payer: Medicare HMO | Admitting: Hematology and Oncology

## 2018-02-19 VITALS — BP 147/84 | HR 118 | Temp 97.9°F | Resp 18 | Ht 73.0 in | Wt 210.1 lb

## 2018-02-19 DIAGNOSIS — J449 Chronic obstructive pulmonary disease, unspecified: Secondary | ICD-10-CM | POA: Diagnosis not present

## 2018-02-19 DIAGNOSIS — D696 Thrombocytopenia, unspecified: Secondary | ICD-10-CM

## 2018-02-19 DIAGNOSIS — E538 Deficiency of other specified B group vitamins: Secondary | ICD-10-CM

## 2018-02-19 DIAGNOSIS — D649 Anemia, unspecified: Secondary | ICD-10-CM | POA: Diagnosis not present

## 2018-02-19 DIAGNOSIS — Z79899 Other long term (current) drug therapy: Secondary | ICD-10-CM | POA: Diagnosis not present

## 2018-02-19 DIAGNOSIS — K760 Fatty (change of) liver, not elsewhere classified: Secondary | ICD-10-CM | POA: Diagnosis not present

## 2018-02-19 DIAGNOSIS — E785 Hyperlipidemia, unspecified: Secondary | ICD-10-CM | POA: Diagnosis not present

## 2018-02-19 DIAGNOSIS — N400 Enlarged prostate without lower urinary tract symptoms: Secondary | ICD-10-CM | POA: Diagnosis not present

## 2018-02-19 DIAGNOSIS — E119 Type 2 diabetes mellitus without complications: Secondary | ICD-10-CM | POA: Diagnosis not present

## 2018-02-19 DIAGNOSIS — I129 Hypertensive chronic kidney disease with stage 1 through stage 4 chronic kidney disease, or unspecified chronic kidney disease: Secondary | ICD-10-CM | POA: Diagnosis not present

## 2018-02-19 NOTE — Telephone Encounter (Signed)
Appointments scheduled AVS/Calendar printed per 5/13 los °

## 2018-02-20 ENCOUNTER — Telehealth: Payer: Self-pay | Admitting: Family Medicine

## 2018-02-20 ENCOUNTER — Other Ambulatory Visit (INDEPENDENT_AMBULATORY_CARE_PROVIDER_SITE_OTHER): Payer: Medicare HMO

## 2018-02-20 ENCOUNTER — Other Ambulatory Visit: Payer: Self-pay

## 2018-02-20 DIAGNOSIS — I10 Essential (primary) hypertension: Secondary | ICD-10-CM | POA: Diagnosis not present

## 2018-02-20 LAB — BASIC METABOLIC PANEL WITH GFR
BUN: 12 mg/dL (ref 6–23)
CO2: 27 meq/L (ref 19–32)
Calcium: 9.2 mg/dL (ref 8.4–10.5)
Chloride: 106 meq/L (ref 96–112)
Creatinine, Ser: 1.02 mg/dL (ref 0.40–1.50)
GFR: 75.26 mL/min (ref >60.00)
Glucose, Bld: 158 mg/dL — ABNORMAL HIGH (ref 70–99)
Potassium: 4.8 meq/L (ref 3.5–5.1)
Sodium: 142 meq/L (ref 135–145)

## 2018-02-20 MED ORDER — ROSUVASTATIN CALCIUM 10 MG PO TABS: 10.0000 mg | ORAL_TABLET | Freq: Every day | ORAL | 3 refills | Status: DC

## 2018-02-20 NOTE — Telephone Encounter (Signed)
Noted. Medication has been sent into patient pharmacy

## 2018-02-20 NOTE — Progress Notes (Signed)
Kidney function doing okay on the lisinopril fortunately.

## 2018-02-20 NOTE — Telephone Encounter (Signed)
MEDICATION: Rosuvastatin 10 MG tablet   PHARMACY:  [facility] on [address]   IS THIS A 90 DAY SUPPLY : yes  IS PATIENT OUT OF MEDICATION: yes    IF NOT; HOW MUCH IS LEFT:   LAST APPOINTMENT DATE: @5 /05/2018  NEXT APPOINTMENT DATE:@5 /14/2019  OTHER COMMENTS: Patient requests that this be filled today. I advised the patient it normally takes a few days, however I would include his request in the note.    **Let patient know to contact pharmacy at the end of the day to make sure medication is ready. **  ** Please notify patient to allow 48-72 hours to process**  **Encourage patient to contact the pharmacy for refills or they can request refills through [facility]**

## 2018-02-27 ENCOUNTER — Other Ambulatory Visit: Payer: Self-pay | Admitting: Radiology

## 2018-02-28 ENCOUNTER — Ambulatory Visit (HOSPITAL_COMMUNITY)
Admission: RE | Admit: 2018-02-28 | Discharge: 2018-02-28 | Disposition: A | Discharge status: Home / Self Care | Payer: Medicare HMO | Source: Ambulatory Visit | Attending: Hematology and Oncology | Admitting: Hematology and Oncology

## 2018-02-28 ENCOUNTER — Encounter (HOSPITAL_COMMUNITY): Payer: Self-pay

## 2018-02-28 DIAGNOSIS — D696 Thrombocytopenia, unspecified: Secondary | ICD-10-CM | POA: Insufficient documentation

## 2018-02-28 DIAGNOSIS — M1990 Unspecified osteoarthritis, unspecified site: Secondary | ICD-10-CM | POA: Diagnosis not present

## 2018-02-28 DIAGNOSIS — Z79899 Other long term (current) drug therapy: Secondary | ICD-10-CM | POA: Diagnosis not present

## 2018-02-28 DIAGNOSIS — D649 Anemia, unspecified: Secondary | ICD-10-CM | POA: Insufficient documentation

## 2018-02-28 DIAGNOSIS — Z7984 Long term (current) use of oral hypoglycemic drugs: Secondary | ICD-10-CM | POA: Diagnosis not present

## 2018-02-28 DIAGNOSIS — E785 Hyperlipidemia, unspecified: Secondary | ICD-10-CM | POA: Insufficient documentation

## 2018-02-28 DIAGNOSIS — Z7982 Long term (current) use of aspirin: Secondary | ICD-10-CM | POA: Diagnosis not present

## 2018-02-28 DIAGNOSIS — Z7951 Long term (current) use of inhaled steroids: Secondary | ICD-10-CM | POA: Insufficient documentation

## 2018-02-28 DIAGNOSIS — I10 Essential (primary) hypertension: Secondary | ICD-10-CM | POA: Diagnosis not present

## 2018-02-28 DIAGNOSIS — Z87891 Personal history of nicotine dependence: Secondary | ICD-10-CM | POA: Diagnosis not present

## 2018-02-28 DIAGNOSIS — J449 Chronic obstructive pulmonary disease, unspecified: Secondary | ICD-10-CM | POA: Insufficient documentation

## 2018-02-28 DIAGNOSIS — N400 Enlarged prostate without lower urinary tract symptoms: Secondary | ICD-10-CM | POA: Insufficient documentation

## 2018-02-28 DIAGNOSIS — D7589 Other specified diseases of blood and blood-forming organs: Secondary | ICD-10-CM | POA: Diagnosis not present

## 2018-02-28 DIAGNOSIS — E119 Type 2 diabetes mellitus without complications: Secondary | ICD-10-CM | POA: Diagnosis not present

## 2018-02-28 DIAGNOSIS — F329 Major depressive disorder, single episode, unspecified: Secondary | ICD-10-CM | POA: Insufficient documentation

## 2018-02-28 LAB — CBC WITH DIFFERENTIAL/PLATELET
Basophils Absolute: 0 10*3/uL (ref 0.0–0.1)
Basophils Relative: 1 %
EOS ABS: 0.1 10*3/uL (ref 0.0–0.7)
EOS PCT: 1 %
HCT: 33.5 % — ABNORMAL LOW (ref 39.0–52.0)
Hemoglobin: 11.4 g/dL — ABNORMAL LOW (ref 13.0–17.0)
LYMPHS ABS: 2.1 10*3/uL (ref 0.7–4.0)
Lymphocytes Relative: 36 %
MCH: 33.3 pg (ref 26.0–34.0)
MCHC: 34 g/dL (ref 30.0–36.0)
MCV: 98 fL (ref 78.0–100.0)
MONOS PCT: 6 %
Monocytes Absolute: 0.3 10*3/uL (ref 0.1–1.0)
Neutro Abs: 3.2 10*3/uL (ref 1.7–7.7)
Neutrophils Relative %: 56 %
PLATELETS: 127 10*3/uL — AB (ref 150–400)
RBC: 3.42 MIL/uL — ABNORMAL LOW (ref 4.22–5.81)
RDW: 13.9 % (ref 11.5–15.5)
WBC: 5.7 10*3/uL (ref 4.0–10.5)

## 2018-02-28 LAB — GLUCOSE, CAPILLARY: Glucose-Capillary: 149 mg/dL — ABNORMAL HIGH (ref 65–99)

## 2018-02-28 LAB — PROTIME-INR
INR: 0.96
PROTHROMBIN TIME: 12.7 s (ref 11.4–15.2)

## 2018-02-28 MED ORDER — FENTANYL CITRATE (PF) 100 MCG/2ML IJ SOLN
INTRAMUSCULAR | Status: AC | PRN
  Administered 2018-02-28 (×2): 50 ug via INTRAVENOUS

## 2018-02-28 MED ORDER — MIDAZOLAM HCL 2 MG/2ML IJ SOLN
INTRAMUSCULAR | Status: AC | PRN
  Administered 2018-02-28 (×2): 1 mg via INTRAVENOUS

## 2018-02-28 MED ORDER — LIDOCAINE HCL (PF) 1 % IJ SOLN
INTRAMUSCULAR | Status: AC | PRN
  Administered 2018-02-28: 10 mL

## 2018-02-28 MED ORDER — FENTANYL CITRATE (PF) 100 MCG/2ML IJ SOLN
INTRAMUSCULAR | Status: AC
  Filled 2018-02-28: qty 4

## 2018-02-28 MED ORDER — SODIUM CHLORIDE 0.9 % IV SOLN
INTRAVENOUS | Status: DC
  Administered 2018-02-28: 09:00:00 via INTRAVENOUS

## 2018-02-28 MED ORDER — MIDAZOLAM HCL 2 MG/2ML IJ SOLN
INTRAMUSCULAR | Status: AC
  Filled 2018-02-28: qty 4

## 2018-02-28 NOTE — Discharge Instructions (Addendum)
Moderate Conscious Sedation, Adult, Care After These instructions provide you with information about caring for yourself after your procedure. Your health care provider may also give you more specific instructions. Your treatment has been planned according to current medical practices, but problems sometimes occur. Call your health care provider if you have any problems or questions after your procedure. What can I expect after the procedure? After your procedure, it is common:  To feel sleepy for several hours.  To feel clumsy and have poor balance for several hours.  To have poor judgment for several hours.  To vomit if you eat too soon.  Follow these instructions at home: For at least 24 hours after the procedure:   Do not: ? Participate in activities where you could fall or become injured. ? Drive. ? Use heavy machinery. ? Drink alcohol. ? Take sleeping pills or medicines that cause drowsiness. ? Make important decisions or sign legal documents. ? Take care of children on your own.  Rest. Eating and drinking  Follow the diet recommended by your health care provider.  If you vomit: ? Drink water, juice, or soup when you can drink without vomiting. ? Make sure you have little or no nausea before eating solid foods. General instructions  Have a responsible adult stay with you until you are awake and alert.  Take over-the-counter and prescription medicines only as told by your health care provider.  If you smoke, do not smoke without supervision.  Keep all follow-up visits as told by your health care provider. This is important. Contact a health care provider if:  You keep feeling nauseous or you keep vomiting.  You feel light-headed.  You develop a rash.  You have a fever. Get help right away if:  You have trouble breathing. This information is not intended to replace advice given to you by your health care provider. Make sure you discuss any questions you have  with your health care provider. Document Released: 07/17/2013 Document Revised: 02/29/2016 Document Reviewed: 01/16/2016 Elsevier Interactive Patient Education  2018 [employer].   Bone Marrow Aspiration and Bone Marrow Biopsy, Adult, Care After This sheet gives you information about how to care for yourself after your procedure. Your health care provider may also give you more specific instructions. If you have problems or questions, contact your health care provider. What can I expect after the procedure? After the procedure, it is common to have:  Mild pain and tenderness.  Swelling.  Bruising.  Follow these instructions at home:  Take over-the-counter or prescription medicines only as told by your health care provider.  Do not take baths, swim, or use a hot tub until your health care provider approves. Ask if you can take a shower or have a sponge bath.  You may shower tomorrow.  Follow instructions from your health care provider about how to take care of the puncture site. Make sure you: ? Wash your hands with soap and water before you change your bandage (dressing). If soap and water are not available, use hand sanitizer. ? Change your dressing as told by your health care provider.  You may remove dressing tomorrow.  Check your puncture siteevery day for signs of infection. Check for: ? More redness, swelling, or pain. ? More fluid or blood. ? Warmth. ? Pus or a bad smell.  Return to your normal activities as told by your health care provider. Ask your health care provider what activities are safe for you.  Do not drive for  24 hours if you were given a medicine to help you relax (sedative).  Keep all follow-up visits as told by your health care provider. This is important. Contact a health care provider if:  You have more redness, swelling, or pain around the puncture site.  You have more fluid or blood coming from the puncture site.  Your puncture site feels warm  to the touch.  You have pus or a bad smell coming from the puncture site.  You have a fever.  Your pain is not controlled with medicine. This information is not intended to replace advice given to you by your health care provider. Make sure you discuss any questions you have with your health care provider. Document Released: 04/15/2005 Document Revised: 04/15/2016 Document Reviewed: 03/09/2016 Elsevier Interactive Patient Education  2018 [employer].

## 2018-02-28 NOTE — Consult Note (Signed)
Chief Complaint: Patient was seen in consultation today for CT-guided bone marrow biopsy  Referring Physician(s): Paulding G  Supervising Physician: Jacqulynn Cadet  Patient Status: [facility] - Out-pt  History of Present Illness: [name] is a 77 y.o. male with history of anemia and thrombocytopenia of unknown etiology who  presents today for CT-guided bone marrow biopsy for further evaluation.  Past Medical History:  Diagnosis Date  . Benign prostatic hypertrophy   . COPD (chronic obstructive pulmonary disease) ([place]) December 15, 2009   FEV1 2.30 (70%) ratio 63 no better with B2 and DLCO 18/6 (73%) corrects to 106%  . Coughing December 18, 2009   Sinus CT : Minor mucosal thickening at the Right Frontoethmoidal recess, otherwise  negative sinuses  . Degenerative joint disease   . Depression   . Diabetes mellitus type II   . Hyperlipidemia   . Hypertension August 12, 2009   try off ACE for upper airway cough  . Low back pain   . Otitis externa 07/30/2012    Past Surgical History:  Procedure Laterality Date  . dvt  1980    Allergies: Patient has no known allergies.  Medications: Prior to Admission medications   Medication Sig Start Date End Date Taking? Authorizing Provider  acyclovir (ZOVIRAX) 400 MG tablet TAKE 1 TABLET TWICE DAILY 12/26/17  Yes [name], MD  amitriptyline (ELAVIL) 50 MG tablet Take 1 tablet (50 mg total) by mouth at bedtime. 09/27/17  Yes [name], MD  amLODipine (NORVASC) 10 MG tablet TAKE 1 TABLET EVERY DAY 01/16/18  Yes [name], MD  aspirin 81 MG tablet Take 81 mg by mouth daily.     Yes [provider]  Cholecalciferol 1000 UNITS capsule Take 1 capsule (1,000 Units total) by mouth 2 (two) times daily. 05/19/11  Yes Parrett, Tammy S, NP  diclofenac sodium (VOLTAREN) 1 % GEL Apply 2 g topically 4 (four) times daily. 06/05/17  Yes [name], MD  famotidine (PEPCID) 20 MG tablet Take 1 tablet (20 mg  total) by mouth 2 (two) times daily. 09/28/17  Yes [name], MD  finasteride (PROSCAR) 5 MG tablet TAKE 1 TABLET EVERY DAY 07/27/17  Yes [name], MD  gabapentin (NEURONTIN) 300 MG capsule TAKE 1 CAPSULE TWICE DAILY 12/20/17  Yes [name], MD  lisinopril (ZESTRIL) 2.5 MG tablet Take 1 tablet (2.5 mg total) by mouth daily. 01/30/18  Yes [name], MD  metFORMIN (GLUCOPHAGE) 1000 MG tablet Take 1 tablet (1,000 mg total) by mouth 2 (two) times daily with a meal. 09/30/17  Yes [name], MD  metFORMIN (GLUCOPHAGE) 500 MG tablet TAKE 1 TABLET EVERY DAY WITH BREAKFAST 12/20/17  Yes [name], MD  Multiple Vitamin (MULTIVITAMIN) capsule Take 1 capsule by mouth daily.     Yes [provider]  NONFORMULARY OR COMPOUNDED ITEM [facility]  Peripheral Neuropathy Cream- Bupivacaine 1%, Doxepin 3%, Gabapentin 6%, Pentoxifylline 3%, Topiramate 1% Apply 1-2 grams to affected area 3-4 times daily Qty. 120 gm 3 refills 02/20/17  Yes [provider]  rosuvastatin (CRESTOR) 10 MG tablet Take 1 tablet (10 mg total) by mouth daily. 02/20/18  Yes [name], MD  tamsulosin (FLOMAX) 0.4 MG CAPS capsule TAKE 1 CAPSULE EVERY DAY 05/11/17  Yes [name], MD  TRUEPLUS LANCETS 28G MISC Use to test blood sugars two times daily. Dx: E11.9 02/17/16  Yes [name], MD  albuterol (PROVENTIL HFA;VENTOLIN  HFA) 108 (90 Base) MCG/ACT inhaler Inhale 2 puffs into the lungs every 6 (six) hours as needed for wheezing or shortness of breath. 12/21/16   [name], MD  clotrimazole-betamethasone (LOTRISONE) cream Apply 1 application topically 2 (two) times daily. For 7 days maximum. Stop if worsening symptoms. 09/28/17   [name], MD     Family History  Problem Relation Age of Onset  . Cancer Mother        melanoma  . Stroke Father     Social History   Socioeconomic History  . Marital status: Married    Spouse name: Not on  file  . Number of children: Not on file  . Years of education: Not on file  . Highest education level: Not on file  Occupational History  . Occupation: retired    Comment: maintenence work  Scientific laboratory technician  . Financial resource strain: Not on file  . Food insecurity:    Worry: Not on file    Inability: Not on file  . Transportation needs:    Medical: Not on file    Non-medical: Not on file  Tobacco Use  . Smoking status: Former Smoker    Packs/day: 4.00    Years: 20.00    Pack years: 80.00    Types: Cigarettes    Last attempt to quit: 09/18/1984    Years since quitting: 33.4  . Smokeless tobacco: Never Used  . Tobacco comment: quit in 1980  Substance and Sexual Activity  . 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 Drug use: No  . Sexual activity: Yes  Lifestyle  . Physical activity:    Days per week: Not on file    Minutes per session: Not on file  . Stress: Not on file  Relationships  . Social connections:    Talks on phone: Not on file    Gets together: Not on file    Attends religious service: Not on file    Active member of club or organization: Not on file    Attends meetings of clubs or organizations: Not on file    Relationship status: Not on file  Other Topics Concern  . Not on file  Social History Narrative   Married 53 years in 2015. 4 kids (2 sons) and 3 grandkids.    Lived in [place], [place] wholel life      Retired from [employer], going to [employer] where they have a camper               Review of Systems denies fever, headache, chest pain, dyspnea, cough, abdominal pain, nausea, vomiting or bleeding.  He does have back pain.  Vital Signs: BP (!) 150/78 (BP Location: Right Arm)   Pulse 95   Temp 97.6 F (36.4 C) (Oral)   Resp 18   SpO2 98%   Physical Exam awake, alert.  Chest clear to auscultation bilaterally.  Heart with regular rate and rhythm.  Abdomen protuberant, positive bowel sounds, nontender.   Trace bilateral lower extremity edema.  Imaging: No results found.  Labs:  CBC: Recent Labs    03/30/17 1459 09/28/17 1414 11/01/17 1213 02/13/18 1550  WBC 9.5 6.5 8.5 5.3  HGB 12.5* 12.3* 12.9* 11.6*  HCT 36.7* 36.5* 37.8* 34.7*  PLT 165.0 119.0* 128.0* 118*    COAGS: No results for input(s): INR, APTT in the last 8760 hours.  BMP: Recent Labs  04/04/17 1323 09/28/17 1414 02/13/18 1550 02/20/18 1053  NA 141 136 140 142  K 4.9 4.3 4.7 4.8  CL 105 103 107 106  CO2 '29 24 27 27  ' GLUCOSE 212* 294* 131 158*  BUN '19 13 12 12  ' CALCIUM 9.2 8.9 9.7 9.2  CREATININE 1.26 1.17 1.17 1.02  GFRNONAA  --   --  58*  --   GFRAA  --   --  >60  --     LIVER FUNCTION TESTS: Recent Labs    03/30/17 1459 09/28/17 1414 02/13/18 1550  BILITOT 0.4 0.3 0.3  AST '15 18 28  ' ALT '11 12 19  ' ALKPHOS 105 91 92  PROT 6.5 6.8 6.9  ALBUMIN 4.1 4.1 3.9    TUMOR MARKERS: No results for input(s): AFPTM, CEA, CA199, CHROMGRNA in the last 8760 hours.  Assessment and Plan: 77 y.o. male with history of anemia and thrombocytopenia of unknown etiology who  presents today for CT-guided bone marrow biopsy for further evaluation.Risks and benefits discussed with the patient/spouse including, but not limited to bleeding, infection, damage to adjacent structures or low yield requiring additional tests.  All of the patient's questions were answered, patient is agreeable to proceed. Consent signed and in chart.     Thank you for this interesting consult.  I greatly enjoyed meeting JANOAH MENNA and look forward to participating in their care.  A copy of this report was sent to the requesting provider on this date.  Electronically Signed: D. Rowe Robert, PA-C 02/28/2018, 8:51 AM   I spent a total of  20 minutes in consultation with patient , greater than 50% of which was counseling/coordinating care for CT-guided bone marrow biopsy

## 2018-02-28 NOTE — Procedures (Signed)
Interventional Radiology Procedure Note  Procedure: CT guided aspirate and core biopsy of right iliac bone Complications: None Recommendations: - Bedrest supine x 1 hrs - Hydrocodone PRN  Pain - Follow biopsy results  Signed,  Germani Gavilanes K. Klye Besecker, MD   

## 2018-03-05 DIAGNOSIS — D539 Nutritional anemia, unspecified: Secondary | ICD-10-CM | POA: Insufficient documentation

## 2018-03-05 DIAGNOSIS — D649 Anemia, unspecified: Secondary | ICD-10-CM | POA: Insufficient documentation

## 2018-03-05 NOTE — Assessment & Plan Note (Signed)
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  Previously has diagnosis of B12 deficiency, but confirmatory lab work is not available to me at the moment.  Additional possible etiologies include anemia of chronic disease and decreased level of testosterone due to andropause.  Plan: -Repeat lab work as outlined below. -Return to clinic in 1 week to review the findings.

## 2018-03-05 NOTE — Assessment & Plan Note (Signed)
77 y.o. male with history of fatty liver and previous history of alcohol abuse presenting with minimal thrombocytopenia of at least 2 years in duration without significant progression.  Differential is exceptionally broad, most likely, due to patient's hepatic steatosis, patient may have beginning of portal hypertension resulting in splenomegaly which decreases the circulating platelet counts.  Plan: -Repeat lab work today as outlined below. - Abdominal ultrasound to assess me in size.

## 2018-03-05 NOTE — Progress Notes (Signed)
[name] New Visit:  Assessment: Thrombocytopenia ([name]) 77 y.o. male with history of fatty liver and previous history of alcohol abuse presenting with minimal thrombocytopenia of at least 2 years in duration without significant progression.  Differential is exceptionally broad, most likely, due to patient's hepatic steatosis, patient may have beginning of portal hypertension resulting in splenomegaly which decreases the circulating platelet counts.  Plan: -Repeat lab work today as outlined below. - Abdominal ultrasound to assess me in size.  Anemia Patient has mild chronic anemia tending towards macrocytosis and hypochromia.  Previously has diagnosis of B12 deficiency, but confirmatory lab work is not available to me at the moment.  Additional possible etiologies include anemia of chronic disease and decreased level of testosterone due to andropause.  Plan: -Repeat lab work as outlined below. -Return to clinic in 1 week to review the findings.   Voice recognition software was used and creation of this note. Despite my best effort at editing the text, some misspelling/errors may have occurred. Orders Placed This Encounter  Procedures  . US Abdomen Complete    Standing Status:   Future    Standing Expiration Date:   02/13/2019    Order Specific Question:   Reason for Exam (SYMPTOM  OR DIAGNOSIS REQUIRED)    Answer:   thrombocytopenia -- pelase eva lfor evidence of portal hypertension and splenomegaly    Order Specific Question:   Preferred imaging location?    Answer:   [facility]  . CBC with Differential ([place] Only)    Standing Status:   Future    Number of Occurrences:   1    Standing Expiration Date:   02/14/2019  . CMP ([place] only)    Standing Status:   Future    Number of Occurrences:   1    Standing Expiration Date:   02/14/2019  . Lactate dehydrogenase (LDH)    Standing Status:   Future    Number of Occurrences:   1    Standing  Expiration Date:   02/13/2019  . Iron and TIBC    Standing Status:   Future    Number of Occurrences:   1    Standing Expiration Date:   02/14/2019  . Ferritin    Standing Status:   Future    Number of Occurrences:   1    Standing Expiration Date:   02/14/2019  . Vitamin B12    Standing Status:   Future    Number of Occurrences:   1    Standing Expiration Date:   02/13/2019  . Folate, Serum    Standing Status:   Future    Number of Occurrences:   1    Standing Expiration Date:   02/13/2019  . Methylmalonic acid, serum    Standing Status:   Future    Number of Occurrences:   1    Standing Expiration Date:   02/13/2019  . Testosterone, free, total    Standing Status:   Future    Number of Occurrences:   1    Standing Expiration Date:   02/13/2019  . LH-Luteinizing hormone    Standing Status:   Future    Number of Occurrences:   1    Standing Expiration Date:   02/13/2019  . FSH-Follicle stimulating hormone    Standing Status:   Future    Number of Occurrences:   1    Standing Expiration Date:   02/13/2019  . Thyroid  Panel With TSH    Standing Status:   Future    Number of Occurrences:   1    Standing Expiration Date:   02/13/2019  . Hepatitis C antibody (reflex if positive)    Standing Status:   Future    Number of Occurrences:   1    Standing Expiration Date:   02/13/2019    All questions were answered. . The patient knows to call the clinic with any problems, questions or concerns.  This note was electronically signed.    History of Presenting Illness [name] 77 y.o. presenting to the [facility] for thrombocytopenia evaluation, referred by Dr [name].  Patient's past medical history significant for chronic kidney disease, interstitial lung disease/COPD, diabetes mellitus type 2, history of B12 deficiency, history of fatty liver, hyperlipidemia, obstructive sleep apnea, previous history of positive hepatitis C antibody test and history of nonmelanoma skin Frazier.   Patient does have history of alcohol abuse, but is not drinking at the present time.  Patient quit smoking approximately 30 years ago but previously smoked 3 to 4 packs/day.  Family history significant for mother with cutaneous melanoma, but otherwise negative for disorders or malignancies.  At the present time, patient is complaining of severe fatigue, but remains reasonably active with good appetite no weight loss, no fevers, chills, night sweats.  Denies easy bruising or abnormal bleeding from the gums, nose, rectum, or urinary tract.  No new neurological deficits.  Oncological/hematological History: --Labs, 09/25/16: WBC 6.7, Hgb 12.3,     Plt 135 --Labs, 12/21/16: WBC 6.8, Hgb 12.4,      Plt 134 --Labs, 03/30/17: WBC 9.5, Hgb 12.5,     Plt 165 --Labs, 11/01/17: WBC 8.5, Hgb 12.9, MCV 99.0, MCH 34.1, RDW 13.1,  Plt 128   Medical History: Past Medical History:  Diagnosis Date  . Benign prostatic hypertrophy   . COPD (chronic obstructive pulmonary disease) ([place]) December 15, 2009   FEV1 2.30 (70%) ratio 63 no better with B2 and DLCO 18/6 (73%) corrects to 106%  . Coughing December 18, 2009   Sinus CT : Minor mucosal thickening at the Right Frontoethmoidal recess, otherwise  negative sinuses  . Degenerative joint disease   . Depression   . Diabetes mellitus type II   . Hyperlipidemia   . Hypertension August 12, 2009   try off ACE for upper airway cough  . Low back pain   . Otitis externa 07/30/2012    Surgical History: Past Surgical History:  Procedure Laterality Date  . dvt  1980    Family History: Family History  Problem Relation Age of Onset  . Frazier Mother        melanoma  . Stroke Father     Social History: Social History   Socioeconomic History  . Marital status: Married    Spouse name: Not on file  . Number of children: Not on file  . Years of education: Not on file  . Highest education level: Not on file  Occupational History  . Occupation: retired     Comment: maintenence work  Scientific laboratory technician  . Financial resource strain: Not on file  . Food insecurity:    Worry: Not on file    Inability: Not on file  . Transportation needs:    Medical: Not on file    Non-medical: Not on file  Tobacco Use  . Smoking status: Former Smoker    Packs/day: 4.00    Years: 20.00  Pack years: 80.00    Types: Cigarettes    Last attempt to quit: 09/18/1984    Years since quitting: 33.4  . Smokeless tobacco: Never Used  . Tobacco comment: quit in 1980  Substance and Sexual Activity  . 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 Drug use: No  . Sexual activity: Yes  Lifestyle  . Physical activity:    Days per week: Not on file    Minutes per session: Not on file  . Stress: Not on file  Relationships  . Social connections:    Talks on phone: Not on file    Gets together: Not on file    Attends religious service: Not on file    Active member of club or organization: Not on file    Attends meetings of clubs or organizations: Not on file    Relationship status: Not on file  . Intimate partner violence:    Fear of current or ex partner: Not on file    Emotionally abused: Not on file    Physically abused: Not on file    Forced sexual activity: Not on file  Other Topics Concern  . Not on file  Social History Narrative   Married 53 years in 2015. 4 kids (2 sons) and 3 grandkids.    Lived in [place], [place] wholel life      Retired from [employer], going to [employer] where they have a camper             Allergies: No Known Allergies  Medications:  Current Outpatient Medications  Medication Sig Dispense Refill  . acyclovir (ZOVIRAX) 400 MG tablet TAKE 1 TABLET TWICE DAILY 180 tablet 2  . albuterol (PROVENTIL HFA;VENTOLIN HFA) 108 (90 Base) MCG/ACT inhaler Inhale 2 puffs into the lungs every 6 (six) hours as needed for wheezing or shortness of breath. 1 Inhaler 5  . amitriptyline (ELAVIL) 50 MG tablet Take  1 tablet (50 mg total) by mouth at bedtime. 90 tablet 3  . amLODipine (NORVASC) 10 MG tablet TAKE 1 TABLET EVERY DAY 90 tablet 3  . aspirin 81 MG tablet Take 81 mg by mouth daily.      . Cholecalciferol 1000 UNITS capsule Take 1 capsule (1,000 Units total) by mouth 2 (two) times daily.    . clotrimazole-betamethasone (LOTRISONE) cream Apply 1 application topically 2 (two) times daily. For 7 days maximum. Stop if worsening symptoms. 45 g 1  . diclofenac sodium (VOLTAREN) 1 % GEL Apply 2 g topically 4 (four) times daily. 100 g 3  . famotidine (PEPCID) 20 MG tablet Take 1 tablet (20 mg total) by mouth 2 (two) times daily. 180 tablet 3  . finasteride (PROSCAR) 5 MG tablet TAKE 1 TABLET EVERY DAY 90 tablet 3  . gabapentin (NEURONTIN) 300 MG capsule TAKE 1 CAPSULE TWICE DAILY 180 capsule 1  . lisinopril (ZESTRIL) 2.5 MG tablet Take 1 tablet (2.5 mg total) by mouth daily. 30 tablet 5  . metFORMIN (GLUCOPHAGE) 1000 MG tablet Take 1 tablet (1,000 mg total) by mouth 2 (two) times daily with a meal. 180 tablet 3  . metFORMIN (GLUCOPHAGE) 500 MG tablet TAKE 1 TABLET EVERY DAY WITH BREAKFAST 90 tablet 3  . Multiple Vitamin (MULTIVITAMIN) capsule Take 1 capsule by mouth daily.      . NONFORMULARY OR COMPOUNDED ITEM [facility]  Peripheral Neuropathy Cream- Bupivacaine 1%, Doxepin 3%, Gabapentin 6%, Pentoxifylline 3%, Topiramate  1% Apply 1-2 grams to affected area 3-4 times daily Qty. 120 gm 3 refills    . tamsulosin (FLOMAX) 0.4 MG CAPS capsule TAKE 1 CAPSULE EVERY DAY 90 capsule 1  . TRUEPLUS LANCETS 28G MISC Use to test blood sugars two times daily. Dx: E11.9 200 each 3  . rosuvastatin (CRESTOR) 10 MG tablet Take 1 tablet (10 mg total) by mouth daily. 90 tablet 3   No current facility-administered medications for this visit.     Review of Systems: Review of Systems  Constitutional: Positive for fatigue. Negative for appetite change and unexpected weight change.  Hematological: Does not  bruise/bleed easily.  All other systems reviewed and are negative.    PHYSICAL EXAMINATION Blood pressure (!) 153/81, pulse (!) 109, temperature 98.1 F (36.7 C), temperature source Oral, resp. rate 20, height '6\' 1"'  (1.854 m), weight 208 lb 3.2 oz (94.4 kg), SpO2 98 %.  ECOG PERFORMANCE STATUS: 1 - Symptomatic but completely ambulatory  Physical Exam  Constitutional: He is oriented to person, place, and time. He appears well-developed and well-nourished. No distress.  HENT:  Head: Normocephalic and atraumatic.  Mouth/Throat: Oropharynx is clear and moist. No oropharyngeal exudate.  Eyes: Pupils are equal, round, and reactive to light. Conjunctivae and EOM are normal. No scleral icterus.  Neck: No thyromegaly present.  Cardiovascular: Normal rate, regular rhythm, normal heart sounds and intact distal pulses. Exam reveals no gallop and no friction rub.  No murmur heard. Pulmonary/Chest: Effort normal and breath sounds normal. No stridor. No respiratory distress. He has no wheezes. He has no rales.  Abdominal: Soft. Bowel sounds are normal. He exhibits no distension and no mass. There is no tenderness. There is no guarding.  Musculoskeletal: He exhibits no edema.  Lymphadenopathy:    He has no cervical adenopathy.  Neurological: He is alert and oriented to person, place, and time. He displays normal reflexes. No cranial nerve deficit or sensory deficit.  Skin: Skin is warm and dry. No rash noted. He is not diaphoretic. No erythema. No pallor.     LABORATORY DATA: I have personally reviewed the data as listed: Clinical Support on 02/13/2018  Component Date Value Ref Range Status  . WBC Count 02/13/2018 5.3  4.0 - 10.3 K/uL Final  . RBC 02/13/2018 3.45* 4.20 - 5.82 MIL/uL Final  . Hemoglobin 02/13/2018 11.6* 13.0 - 17.1 g/dL Final  . HCT 02/13/2018 34.7* 38.4 - 49.9 % Final  . MCV 02/13/2018 100.6* 79.3 - 98.0 fL Final  . MCH 02/13/2018 33.6* 27.2 - 33.4 pg Final  . MCHC 02/13/2018  33.4  32.0 - 36.0 g/dL Final  . RDW 02/13/2018 14.1  11.0 - 14.6 % Final  . Platelet Count 02/13/2018 118* 140 - 400 K/uL Final  . Neutrophils Relative % 02/13/2018 58  % Final  . Neutro Abs 02/13/2018 3.1  1.5 - 6.5 K/uL Final  . Lymphocytes Relative 02/13/2018 34  % Final  . Lymphs Abs 02/13/2018 1.8  0.9 - 3.3 K/uL Final  . Monocytes Relative 02/13/2018 6  % Final  . Monocytes Absolute 02/13/2018 0.3  0.1 - 0.9 K/uL Final  . Eosinophils Relative 02/13/2018 1  % Final  . Eosinophils Absolute 02/13/2018 0.1  0.0 - 0.5 K/uL Final  . Basophils Relative 02/13/2018 1  % Final  . Basophils Absolute 02/13/2018 0.0  0.0 - 0.1 K/uL Final   Performed at [facility], [place] [address], [place], [place] [address]  . Sodium 02/13/2018 140  136 -  145 mmol/L Final  . Potassium 02/13/2018 4.7  3.5 - 5.1 mmol/L Final  . Chloride 02/13/2018 107  98 - 109 mmol/L Final  . CO2 02/13/2018 27  22 - 29 mmol/L Final  . Glucose, Bld 02/13/2018 131  70 - 140 mg/dL Final  . BUN 02/13/2018 12  7 - 26 mg/dL Final  . Creatinine 02/13/2018 1.17  0.70 - 1.30 mg/dL Final  . Calcium 02/13/2018 9.7  8.4 - 10.4 mg/dL Final  . Total Protein 02/13/2018 6.9  6.4 - 8.3 g/dL Final  . Albumin 02/13/2018 3.9  3.5 - 5.0 g/dL Final  . AST 02/13/2018 28  5 - 34 U/L Final  . ALT 02/13/2018 19  0 - 55 U/L Final  . Alkaline Phosphatase 02/13/2018 92  40 - 150 U/L Final  . Total Bilirubin 02/13/2018 0.3  0.2 - 1.2 mg/dL Final  . GFR, Est Non Af Am 02/13/2018 58* >60 mL/min Final  . GFR, Est AFR Am 02/13/2018 >60  >60 mL/min Final   Comment: (NOTE) The eGFR has been calculated using the CKD EPI equation. This calculation has not been validated in all clinical situations. eGFR's persistently <60 mL/min signify possible Chronic Kidney Disease.   [name] gap 02/13/2018 6  3 - 11 Final   Performed at [facility], [place] [address] [place], [place] [address]  . LDH 02/13/2018 111* 125 -  245 U/L Final   Performed at [facility], [place] [address], [place], [place] [address]  . Iron 02/13/2018 88  42 - 163 ug/dL Final  . TIBC 02/13/2018 254  202 - 409 ug/dL Final  . Saturation Ratios 02/13/2018 35* 42 - 163 % Final  . UIBC 02/13/2018 166  ug/dL Final   Performed at [facility], [place] [address] [place], [place] [address]  . Ferritin 02/13/2018 114  22 - 316 ng/mL Final   Performed at [facility], [place] [address], [place], [place] [address]  . Vitamin B-12 02/13/2018 619  180 - 914 pg/mL Final   Comment: (NOTE) This assay is not validated for testing neonatal or myeloproliferative syndrome specimens for Vitamin B12 levels. Performed at [facility], [place] [address] [place], [place] [address]   . Folate 02/13/2018 30.0  >5.9 ng/mL Final   Performed at [place] [address] [place], [place] [address]  . Methylmalonic Acid, Quantitative 02/13/2018 393* 0 - 378 nmol/L Final  . Disclaimer: 02/13/2018 Comment   Final   Comment: (NOTE) This test was developed and its performance characteristics determined by [facility]. It has not been cleared or approved by the Food and Drug Administration. Performed At: [facility] [place], [place] [id number] Rush Farmer MD [id number] Performed at [facility], [place] [address] [place], [place] [address]   . Testosterone 02/13/2018 185* 264 - 916 ng/dL Final   Comment: (NOTE) Adult male reference interval is based on a population of healthy nonobese males (BMI <30) between 59 and 24 years old. [place], [place] [id number]. PMID: [id number].   Marland Kitchen Testosterone, Free 02/13/2018 0.6* 6.6 - 18.1 pg/mL Final  . Sex Hormone Binding 02/13/2018 128.2* 19.3 - 76.4 nmol/L Final   Comment: (NOTE) Performed At: [facility] [place], [place] [id number] Rush Farmer MD  IN:[id number] Performed at [facility], [place] [address], [place], [place] [address]   . LH 02/13/2018 13.2* 1.7 -  8.6 mIU/mL Final   Comment: (NOTE) Performed At: [facility] [place], [place] [id number] Rush Farmer MD AP:[id number] Performed at [facility], [place] [address], [place], [place] [address]   . [place] 02/13/2018 7.2  1.5 - 12.4 mIU/mL Final   Comment: (NOTE) Performed At: [facility] [place], [place] [id number] Rush Farmer MD UO:[id number] Performed at [facility], [place] [address], [place], [place] [address]   . TSH 02/13/2018 0.824  0.450 - 4.500 uIU/mL Final  . T4, Total 02/13/2018 5.2  4.5 - 12.0 ug/dL Final  . T3 Uptake Ratio 02/13/2018 30  24 - 39 % Final  . Free Thyroxine Index 02/13/2018 1.6  1.2 - 4.9 Final   Comment: (NOTE) Performed At: [facility] [place], [place] [id number] Rush Farmer MD [id number] Performed at [facility], [place] [address], [place], [place] [address]   . HCV Ab 02/13/2018 <0.1  0.0 - 0.9 s/co ratio Final   Comment: (NOTE) Performed At: [facility] [place], [place] [id number] Rush Farmer MD [id number] Performed at [facility], [place] [address], [place],  [address]   . Comment: 02/13/2018 Comment   Final   Comment: (NOTE) Non reactive HCV antibody screen is consistent with no HCV infection, unless recent infection is suspected or other evidence exists to indicate HCV infection. Performed At: [facility] [place], [place] [id number] Rush Farmer MD [id number] Performed at [facility], [place] [address] [place],  [address]          Ardath Sax, MD

## 2018-03-06 ENCOUNTER — Other Ambulatory Visit: Payer: Self-pay | Admitting: Family Medicine

## 2018-03-07 NOTE — Assessment & Plan Note (Signed)
77 y.o. male with history of fatty liver and previous history of alcohol abuse presenting with mild thrombocytopenia of at least 2 years in duration with minimal progression over the past year.  In addition, progressive macrocytic hyperchromic anemia is noted.  Differential is broad, but conjunction of 2 hematological abnormalities points at possible development of myelodysplastic syndrome.  Of interest, patient's methylmalonic acid level is elevated while vitamin B12 is not deficient nor is folate.  At this time, I do not have a definitive explanation for this finding.  In addition, patient has decreased testosterone with increased luteinizing hormone suggestive possible component of andropause contributing to the degree of anemia.  Plan: -Consult interventional radiology for bone marrow biopsy.   -Return to clinic in 4 weeks to review the findings.

## 2018-03-07 NOTE — Progress Notes (Signed)
[name] Follow-up Visit:  Assessment: Anemia 77 y.o. male with history of fatty liver and previous history of alcohol abuse presenting with mild thrombocytopenia of at least 2 years in duration with minimal progression over the past year.  In addition, progressive macrocytic hyperchromic anemia is noted.  Differential is broad, but conjunction of 2 hematological abnormalities points at possible development of myelodysplastic syndrome.  Of interest, patient's methylmalonic acid level is elevated while vitamin B12 is not deficient nor is folate.  At this time, I do not have a definitive explanation for this finding.  In addition, patient has decreased testosterone with increased luteinizing hormone suggestive possible component of andropause contributing to the degree of anemia.  Plan: -Consult interventional radiology for bone marrow biopsy.   -Return to clinic in 4 weeks to review the findings.   Voice recognition software was used and creation of this note. Despite my best effort at editing the text, some misspelling/errors may have occurred.  Orders Placed This Encounter  Procedures  . CT BIOPSY    Order Specific Question:   Lab orders requested (DO NOT place separate lab orders, these will be automatically ordered during procedure specimen collection):    Answer:   Cytology - Non Pap    Comments:   Pathology, flow, cytogenetics, MDS FISH    Order Specific Question:   Lab orders requested (DO NOT place separate lab orders, these will be automatically ordered during procedure specimen collection):    Answer:   Surgical Pathology    Order Specific Question:   Lab orders requested (DO NOT place separate lab orders, these will be automatically ordered during procedure specimen collection):    Answer:   Other    Order Specific Question:   Reason for Exam (SYMPTOM  OR DIAGNOSIS REQUIRED)    Answer:   Bi-cytopenia -- please eval for MDS    Order Specific Question:    Preferred imaging location?    Answer:   [facility]    Order Specific Question:   Radiology Contrast Protocol - do NOT remove file path    Answer:   \\[email or web address]  . CT BONE MARROW BIOPSY & ASPIRATION    Standing Status:   Future    Number of Occurrences:   1    Standing Expiration Date:   05/23/2019    Order Specific Question:   Reason for Exam (SYMPTOM  OR DIAGNOSIS REQUIRED)    Answer:   Bi-cytopenia -- please eval for MDS    Order Specific Question:   Preferred imaging location?    Answer:   [facility]    Order Specific Question:   Radiology Contrast Protocol - do NOT remove file path    Answer:   \\[email or web address]\CTProtocols.pdf    Cancer Staging No matching staging information was found for the patient.  All questions were answered.  . The patient knows to call the clinic with any problems, questions or concerns.  This note was electronically signed.    History of Presenting Illness [name] is a 77 y.o. male followed in the [facility] for thrombocytopenia and anemia evaluation, referred by [name].  Patient's past medical history significant for chronic kidney disease, interstitial lung disease/COPD, diabetes mellitus type 2, history of B12 deficiency, history of fatty liver, hyperlipidemia, obstructive sleep apnea, previous history of positive hepatitis C antibody test and history of nonmelanoma skin cancer.  Patient does have history of alcohol abuse, but is not drinking at the  present time.  Patient quit smoking approximately 30 years ago but previously smoked 3 to 4 packs/day.  Family history significant for mother with cutaneous melanoma, but otherwise negative for disorders or malignancies.  At the present time, patient is complaining of severe fatigue, but remains reasonably active with good appetite no weight loss, no fevers, chills, night sweats.  Denies easy bruising or abnormal bleeding  from the gums, nose, rectum, or urinary tract.  No new neurological deficits.  Oncological/hematological History: --Labs, 09/25/16: WBC 6.7, Hgb 12.3,                                                             Plt 135 --Labs, 12/21/16: WBC 6.8, Hgb 12.4,                                                             Plt 134 --Labs, 03/30/17: WBC 9.5, Hgb 12.5,                                                             Plt 165 --Labs, 11/01/17: WBC 8.5, Hgb 12.9, MCV   99.0, MCH 34.1,   RDW 13.1,  Plt 128 --Labs, 02/13/18: WBC 5.3, Hgb 11.6, MCV 100.6, MCH 33.6, MCHC 33.4, RDW 14.1,  Plt 118; Fe 88, FeSat 35%, TIBC 254, Ferritin 114, Folate 30, VitB12 619, MMA 393; LDH 111; LH 13.2, FSH 7.2, Testosterone 185, TSH 0.82; HCV -- negative    No history exists.    Medical History: Past Medical History:  Diagnosis Date  . Benign prostatic hypertrophy   . COPD (chronic obstructive pulmonary disease) ([facility]) December 15, 2009   FEV1 2.30 (70%) ratio 63 no better with B2 and DLCO 18/6 (73%) corrects to 106%  . Coughing December 18, 2009   Sinus CT : Minor mucosal thickening at the Right Frontoethmoidal recess, otherwise  negative sinuses  . Degenerative joint disease   . Depression   . Diabetes mellitus type II   . Hyperlipidemia   . Hypertension August 12, 2009   try off ACE for upper airway cough  . Low back pain   . Otitis externa 07/30/2012    Surgical History: Past Surgical History:  Procedure Laterality Date  . dvt  1980    Family History: Family History  Problem Relation Age of Onset  . Cancer Mother        melanoma  . Stroke Father     Social History: Social History   Socioeconomic History  . Marital status: Married    Spouse name: Not on file  . Number of children: Not on file  . Years of education: Not on file  . Highest education level: Not on file  Occupational History  . Occupation: retired    Comment: maintenence work  Scientific laboratory technician  . Financial resource strain:  Not on file  . Food insecurity:    Worry:  Not on file    Inability: Not on file  . Transportation needs:    Medical: Not on file    Non-medical: Not on file  Tobacco Use  . Smoking status: Former Smoker    Packs/day: 4.00    Years: 20.00    Pack years: 80.00    Types: Cigarettes    Last attempt to quit: 09/18/1984    Years since quitting: 33.4  . Smokeless tobacco: Never Used  . Tobacco comment: quit in 1980  Substance and Sexual Activity  . 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 Drug use: No  . Sexual activity: Yes  Lifestyle  . Physical activity:    Days per week: Not on file    Minutes per session: Not on file  . Stress: Not on file  Relationships  . Social connections:    Talks on phone: Not on file    Gets together: Not on file    Attends religious service: Not on file    Active member of club or organization: Not on file    Attends meetings of clubs or organizations: Not on file    Relationship status: Not on file  . Intimate partner violence:    Fear of current or ex partner: Not on file    Emotionally abused: Not on file    Physically abused: Not on file    Forced sexual activity: Not on file  Other Topics Concern  . Not on file  Social History Narrative   Married 53 years in 2015. 4 kids (2 sons) and 3 grandkids.    Lived in [place], [place] wholel life      Retired from [employer], going to [employer] where they have a camper             Allergies: No Known Allergies  Medications:  Current Outpatient Medications  Medication Sig Dispense Refill  . acyclovir (ZOVIRAX) 400 MG tablet TAKE 1 TABLET TWICE DAILY 180 tablet 2  . albuterol (PROVENTIL HFA;VENTOLIN HFA) 108 (90 Base) MCG/ACT inhaler Inhale 2 puffs into the lungs every 6 (six) hours as needed for wheezing or shortness of breath. 1 Inhaler 5  . amitriptyline (ELAVIL) 50 MG tablet Take 1 tablet (50 mg total) by mouth at bedtime. 90 tablet 3  . amLODipine  (NORVASC) 10 MG tablet TAKE 1 TABLET EVERY DAY 90 tablet 3  . aspirin 81 MG tablet Take 81 mg by mouth daily.      . Cholecalciferol 1000 UNITS capsule Take 1 capsule (1,000 Units total) by mouth 2 (two) times daily.    . clotrimazole-betamethasone (LOTRISONE) cream Apply 1 application topically 2 (two) times daily. For 7 days maximum. Stop if worsening symptoms. 45 g 1  . diclofenac sodium (VOLTAREN) 1 % GEL Apply 2 g topically 4 (four) times daily. 100 g 3  . famotidine (PEPCID) 20 MG tablet Take 1 tablet (20 mg total) by mouth 2 (two) times daily. 180 tablet 3  . finasteride (PROSCAR) 5 MG tablet TAKE 1 TABLET EVERY DAY 90 tablet 3  . gabapentin (NEURONTIN) 300 MG capsule TAKE 1 CAPSULE TWICE DAILY 180 capsule 1  . lisinopril (ZESTRIL) 2.5 MG tablet Take 1 tablet (2.5 mg total) by mouth daily. 30 tablet 5  . metFORMIN (GLUCOPHAGE) 1000 MG tablet Take 1 tablet (1,000 mg total) by mouth 2 (two) times daily with a meal. 180 tablet 3  . metFORMIN (GLUCOPHAGE)  500 MG tablet TAKE 1 TABLET EVERY DAY WITH BREAKFAST 90 tablet 3  . Multiple Vitamin (MULTIVITAMIN) capsule Take 1 capsule by mouth daily.      . NONFORMULARY OR COMPOUNDED ITEM [facility]  Peripheral Neuropathy Cream- Bupivacaine 1%, Doxepin 3%, Gabapentin 6%, Pentoxifylline 3%, Topiramate 1% Apply 1-2 grams to affected area 3-4 times daily Qty. 120 gm 3 refills    . rosuvastatin (CRESTOR) 10 MG tablet TAKE 1 TABLET EVERY DAY 90 tablet 3  . tamsulosin (FLOMAX) 0.4 MG CAPS capsule TAKE 1 CAPSULE EVERY DAY 90 capsule 1  . tamsulosin (FLOMAX) 0.4 MG CAPS capsule TAKE 1 CAPSULE EVERY DAY 90 capsule 1  . TRUEPLUS LANCETS 28G MISC Use to test blood sugars two times daily. Dx: E11.9 200 each 3   No current facility-administered medications for this visit.     Review of Systems: Review of Systems  Constitutional: Positive for fatigue. Negative for appetite change and unexpected weight change.  Hematological: Does not bruise/bleed  easily.  All other systems reviewed and are negative.    PHYSICAL EXAMINATION Blood pressure (!) 147/84, pulse (!) 118, temperature 97.9 F (36.6 C), temperature source Oral, resp. rate 18, height '6\' 1"'  (1.854 m), weight 210 lb 1.6 oz (95.3 kg), SpO2 97 %.  ECOG PERFORMANCE STATUS: 1 - Symptomatic but completely ambulatory  Physical Exam  Constitutional: He is oriented to person, place, and time. He appears well-developed and well-nourished. No distress.  HENT:  Head: Normocephalic and atraumatic.  Mouth/Throat: Oropharynx is clear and moist. No oropharyngeal exudate.  Eyes: Pupils are equal, round, and reactive to light. Conjunctivae and EOM are normal. No scleral icterus.  Neck: No thyromegaly present.  Cardiovascular: Normal rate, regular rhythm, normal heart sounds and intact distal pulses. Exam reveals no gallop and no friction rub.  No murmur heard. Pulmonary/Chest: Effort normal and breath sounds normal. No stridor. No respiratory distress. He has no wheezes. He has no rales.  Abdominal: Soft. Bowel sounds are normal. He exhibits no distension and no mass. There is no tenderness. There is no guarding.  Musculoskeletal: He exhibits no edema.  Lymphadenopathy:    He has no cervical adenopathy.  Neurological: He is alert and oriented to person, place, and time. He displays normal reflexes. No cranial nerve deficit or sensory deficit.  Skin: Skin is warm and dry. No rash noted. He is not diaphoretic. No erythema. No pallor.     LABORATORY DATA: I have personally reviewed the data as listed: Clinical Support on 02/13/2018  Component Date Value Ref Range Status  . WBC Count 02/13/2018 5.3  4.0 - 10.3 K/uL Final  . RBC 02/13/2018 3.45* 4.20 - 5.82 MIL/uL Final  . Hemoglobin 02/13/2018 11.6* 13.0 - 17.1 g/dL Final  . HCT 02/13/2018 34.7* 38.4 - 49.9 % Final  . MCV 02/13/2018 100.6* 79.3 - 98.0 fL Final  . MCH 02/13/2018 33.6* 27.2 - 33.4 pg Final  . MCHC 02/13/2018 33.4  32.0 -  36.0 g/dL Final  . RDW 02/13/2018 14.1  11.0 - 14.6 % Final  . Platelet Count 02/13/2018 118* 140 - 400 K/uL Final  . Neutrophils Relative % 02/13/2018 58  % Final  . Neutro Abs 02/13/2018 3.1  1.5 - 6.5 K/uL Final  . Lymphocytes Relative 02/13/2018 34  % Final  . Lymphs Abs 02/13/2018 1.8  0.9 - 3.3 K/uL Final  . Monocytes Relative 02/13/2018 6  % Final  . Monocytes Absolute 02/13/2018 0.3  0.1 - 0.9 K/uL Final  . Eosinophils  Relative 02/13/2018 1  % Final  . Eosinophils Absolute 02/13/2018 0.1  0.0 - 0.5 K/uL Final  . Basophils Relative 02/13/2018 1  % Final  . Basophils Absolute 02/13/2018 0.0  0.0 - 0.1 K/uL Final   Performed at [facility], [place] [address] [place], [place] [address]  . Sodium 02/13/2018 140  136 - 145 mmol/L Final  . Potassium 02/13/2018 4.7  3.5 - 5.1 mmol/L Final  . Chloride 02/13/2018 107  98 - 109 mmol/L Final  . CO2 02/13/2018 27  22 - 29 mmol/L Final  . Glucose, Bld 02/13/2018 131  70 - 140 mg/dL Final  . BUN 02/13/2018 12  7 - 26 mg/dL Final  . Creatinine 02/13/2018 1.17  0.70 - 1.30 mg/dL Final  . Calcium 02/13/2018 9.7  8.4 - 10.4 mg/dL Final  . Total Protein 02/13/2018 6.9  6.4 - 8.3 g/dL Final  . Albumin 02/13/2018 3.9  3.5 - 5.0 g/dL Final  . AST 02/13/2018 28  5 - 34 U/L Final  . ALT 02/13/2018 19  0 - 55 U/L Final  . Alkaline Phosphatase 02/13/2018 92  40 - 150 U/L Final  . Total Bilirubin 02/13/2018 0.3  0.2 - 1.2 mg/dL Final  . GFR, Est Non Af Am 02/13/2018 58* >60 mL/min Final  . GFR, Est AFR Am 02/13/2018 >60  >60 mL/min Final   Comment: (NOTE) The eGFR has been calculated using the CKD EPI equation. This calculation has not been validated in all clinical situations. eGFR's persistently <60 mL/min signify possible Chronic Kidney Disease.   Georgiann Hahn gap 02/13/2018 6  3 - 11 Final   Performed at [facility], [place] [address] [place], [place] [address]  . LDH 02/13/2018 111* 125 - 245 U/L  Final   Performed at [facility], [place] [address], [place], [place] [address]  . [place] 02/13/2018 88  42 - 163 ug/dL Final  . TIBC 02/13/2018 254  202 - 409 ug/dL Final  . Saturation Ratios 02/13/2018 35* 42 - 163 % Final  . UIBC 02/13/2018 166  ug/dL Final   Performed at [facility], [place] [address], [place], [place] [address]  . Ferritin 02/13/2018 114  22 - 316 ng/mL Final   Performed at [facility], [place] [address] [place], [place] [address]  . Vitamin B-12 02/13/2018 619  180 - 914 pg/mL Final   Comment: (NOTE) This assay is not validated for testing neonatal or myeloproliferative syndrome specimens for Vitamin B12 levels. Performed at [facility], [place] [address] [place], [place] [address]   . Folate 02/13/2018 30.0  >5.9 ng/mL Final   Performed at [place] [address], [place], [place] [address]  . Methylmalonic Acid, Quantitative 02/13/2018 393* 0 - 378 nmol/L Final  . Disclaimer: 02/13/2018 Comment   Final   Comment: (NOTE) This test was developed and its performance characteristics determined by [facility]. It has not been cleared or approved by the Food and Drug Administration. Performed At: [facility] [place], [place] [id number] Rush Farmer MD [id number] Performed at [facility], [place] [address] [place], [place] [address]   . Testosterone 02/13/2018 185* 264 - 916 ng/dL Final   Comment: (NOTE) Adult male reference interval is based on a population of healthy nonobese males (BMI <30) between 73 and 72 years old. [place], [place] [phone]. PMID: [id number].   Marland Kitchen  Testosterone, Free 02/13/2018 0.6* 6.6 - 18.1 pg/mL Final  . Sex Hormone Binding 02/13/2018 128.2* 19.3 - 76.4 nmol/L Final   Comment: (NOTE) Performed At: [facility] [place], [place] [id number] Rush Farmer MD  [id number] Performed at [facility], [place] [address], [place], [place] [address]   . LH 02/13/2018 13.2* 1.7 - 8.6 mIU/mL Final   Comment: (NOTE) Performed At: [facility] [place], [place] [id number] Rush Farmer MD [id number] Performed at [facility], [place] [address] [place], [place] [address]   . [place] 02/13/2018 7.2  1.5 - 12.4 mIU/mL Final   Comment: (NOTE) Performed At: [facility] [place], [place] [id number] Rush Farmer MD [id number] Performed at [facility], [place] [address] [place], [place] [address]   . TSH 02/13/2018 0.824  0.450 - 4.500 uIU/mL Final  . T4, Total 02/13/2018 5.2  4.5 - 12.0 ug/dL Final  . T3 Uptake Ratio 02/13/2018 30  24 - 39 % Final  . Free Thyroxine Index 02/13/2018 1.6  1.2 - 4.9 Final   Comment: (NOTE) Performed At: [facility] [place], [place] [id number] Rush Farmer MD [id number] Performed at [facility], [place] [address] [place], [place] [address]   . HCV Ab 02/13/2018 <0.1  0.0 - 0.9 s/co ratio Final   Comment: (NOTE) Performed At: [facility] [place], [place] [id number] Rush Farmer MD [id number] Performed at [facility], [place] [address] [place], [place] [address]   . Comment: 02/13/2018 Comment   Final   Comment: (NOTE) Non reactive HCV antibody screen is consistent with no HCV infection, unless recent infection is suspected or other evidence exists to indicate HCV infection. Performed At: [facility] [place], [place] [id number] Rush Farmer MD [id number] Performed at [facility], [place] [address], [place], [place] [address]        Ardath Sax, MD

## 2018-03-15 LAB — TISSUE HYBRIDIZATION (BONE MARROW)-NCBH

## 2018-03-15 LAB — CHROMOSOME ANALYSIS, BONE MARROW

## 2018-03-19 NOTE — Progress Notes (Signed)
HEMATOLOGY/ONCOLOGY CONSULTATION NOTE  Date of Service: 03/20/2018  Patient Care Team: [name], MD as PCP - General (Family Medicine)  CHIEF COMPLAINTS/PURPOSE OF CONSULTATION:  Thromobcytopenia  HISTORY OF PRESENTING ILLNESS:   [name] is a wonderful 77 y.o. 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 He is accompanied today by his wife. The pt reports that he is doing well overall.   The pt reports that he has had low energy levels from the past 3-4 years since he retired. Today he feels fatigued as well.  He has been taking Metformin for the last 10 years and has recently begun replacing Vitamin B12. He has neuropathy in his feet and has experienced some falls and uses a cane at night. He is also taking Finasteride  Most recent lab results (02/28/18) of CBC  is as follows: all values are WNL except for RBC at 3.42, HGB at 11.4, HCT at 33.5, Platelets at 127k. Testosterone 02/13/18 was lower at 185  On review of systems, pt reports neuropathy in his feet, some fatigue, stable back pain, some ankle swelling, and denies abdominal pains and any other symptoms.   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    MEDICAL HISTORY:  Past Medical History:  Diagnosis Date  . Benign prostatic hypertrophy   . COPD (chronic obstructive pulmonary disease) ([place]) December 15, 2009   FEV1 2.30 (70%) ratio 63 no better with B2 and DLCO 18/6 (73%) corrects to 106%  . Coughing December 18, 2009   Sinus CT : Minor mucosal thickening at the Right Frontoethmoidal recess, otherwise  negative sinuses  . Degenerative joint disease   . Depression   . Diabetes mellitus type II   . Hyperlipidemia   . Hypertension August 12, 2009   try off ACE for upper airway cough  . Low back pain     . Otitis externa 07/30/2012    SURGICAL HISTORY: Past Surgical History:  Procedure Laterality Date  . dvt  1980    SOCIAL HISTORY: Social History   Socioeconomic History  . Marital status: Married    Spouse name: Not on file  . Number of children: Not on file  . Years of education: Not on file  . Highest education level: Not on file  Occupational History  . Occupation: retired    Comment: maintenence work  Scientific laboratory technician  . Financial resource strain: Not on file  . Food insecurity:    Worry: Not on file    Inability: Not on file  . Transportation needs:    Medical: Not on file    Non-medical: Not on file  Tobacco Use  . Smoking status: Former Smoker    Packs/day: 4.00    Years: 20.00    Pack years: 80.00    Types: Cigarettes    Last attempt to quit: 09/18/1984    Years since quitting: 33.5  . Smokeless tobacco: Never Used  . Tobacco comment: quit in 1980  Substance and Sexual Activity  . 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 Drug use: No  . Sexual activity: Yes  Lifestyle  . Physical activity:    Days per week: Not on file    Minutes per session: Not  on file  . Stress: Not on file  Relationships  . Social connections:    Talks on phone: Not on file    Gets together: Not on file    Attends religious service: Not on file    Active member of club or organization: Not on file    Attends meetings of clubs or organizations: Not on file    Relationship status: Not on file  . Intimate partner violence:    Fear of current or ex partner: Not on file    Emotionally abused: Not on file    Physically abused: Not on file    Forced sexual activity: Not on file  Other Topics Concern  . Not on file  Social History Narrative   Married 53 years in 2015. 4 kids (2 sons) and 3 grandkids.    Lived in [place], [place] wholel life      Retired from [employer], going to [employer] where they have a camper             FAMILY  HISTORY: Family History  Problem Relation Age of Onset  . Cancer Mother        melanoma  . Stroke Father     ALLERGIES:  has No Known Allergies.  MEDICATIONS:  Current Outpatient Medications  Medication Sig Dispense Refill  . acyclovir (ZOVIRAX) 400 MG tablet TAKE 1 TABLET TWICE DAILY 180 tablet 2  . albuterol (PROVENTIL HFA;VENTOLIN HFA) 108 (90 Base) MCG/ACT inhaler Inhale 2 puffs into the lungs every 6 (six) hours as needed for wheezing or shortness of breath. 1 Inhaler 5  . amitriptyline (ELAVIL) 50 MG tablet Take 1 tablet (50 mg total) by mouth at bedtime. 90 tablet 3  . amLODipine (NORVASC) 10 MG tablet TAKE 1 TABLET EVERY DAY 90 tablet 3  . aspirin 81 MG tablet Take 81 mg by mouth daily.      . Cholecalciferol 1000 UNITS capsule Take 1 capsule (1,000 Units total) by mouth 2 (two) times daily.    . clotrimazole-betamethasone (LOTRISONE) cream Apply 1 application topically 2 (two) times daily. For 7 days maximum. Stop if worsening symptoms. 45 g 1  . diclofenac sodium (VOLTAREN) 1 % GEL Apply 2 g topically 4 (four) times daily. 100 g 3  . famotidine (PEPCID) 20 MG tablet Take 1 tablet (20 mg total) by mouth 2 (two) times daily. 180 tablet 3  . finasteride (PROSCAR) 5 MG tablet TAKE 1 TABLET EVERY DAY 90 tablet 3  . gabapentin (NEURONTIN) 300 MG capsule TAKE 1 CAPSULE TWICE DAILY 180 capsule 1  . lisinopril (ZESTRIL) 2.5 MG tablet Take 1 tablet (2.5 mg total) by mouth daily. 30 tablet 5  . metFORMIN (GLUCOPHAGE) 1000 MG tablet Take 1 tablet (1,000 mg total) by mouth 2 (two) times daily with a meal. 180 tablet 3  . metFORMIN (GLUCOPHAGE) 500 MG tablet TAKE 1 TABLET EVERY DAY WITH BREAKFAST 90 tablet 3  . Multiple Vitamin (MULTIVITAMIN) capsule Take 1 capsule by mouth daily.      . NONFORMULARY OR COMPOUNDED ITEM [facility]  Peripheral Neuropathy Cream- Bupivacaine 1%, Doxepin 3%, Gabapentin 6%, Pentoxifylline 3%, Topiramate 1% Apply 1-2 grams to affected area 3-4 times  daily Qty. 120 gm 3 refills    . rosuvastatin (CRESTOR) 10 MG tablet TAKE 1 TABLET EVERY DAY 90 tablet 3  . tamsulosin (FLOMAX) 0.4 MG CAPS capsule TAKE 1 CAPSULE EVERY DAY 90 capsule 1  . tamsulosin (FLOMAX) 0.4 MG CAPS  capsule TAKE 1 CAPSULE EVERY DAY 90 capsule 1  . TRUEPLUS LANCETS 28G MISC Use to test blood sugars two times daily. Dx: E11.9 200 each 3   No current facility-administered medications for this visit.     REVIEW OF SYSTEMS:    10 Point review of Systems was done is negative except as noted above.  PHYSICAL EXAMINATION:  . Vitals:   03/20/18 1259  BP: 140/74  Pulse: 83  Resp: 19  Temp: 98.1 F (36.7 C)  SpO2: 98%   Filed Weights   03/20/18 1259  Weight: 209 lb 1.6 oz (94.8 kg)   .Body mass index is 27.59 kg/m.  GENERAL:alert, in no acute distress and comfortable SKIN: no acute rashes, no significant lesions EYES: conjunctiva are pink and non-injected, sclera anicteric OROPHARYNX: MMM, no exudates, no oropharyngeal erythema or ulceration NECK: supple, no JVD LYMPH:  no palpable lymphadenopathy in the cervical, axillary or inguinal regions LUNGS: clear to auscultation b/l with normal respiratory effort HEART: regular rate & rhythm ABDOMEN:  normoactive bowel sounds , non tender, not distended. Extremity: no pedal edema PSYCH: alert & oriented x 3 with fluent speech NEURO: no focal motor/sensory deficits  LABORATORY DATA:  I have reviewed the data as listed  . CBC Latest Ref Rng & Units 02/28/2018 02/13/2018 11/01/2017  WBC 4.0 - 10.5 K/uL 5.7 5.3 8.5  Hemoglobin 13.0 - 17.0 g/dL 11.4(L) 11.6(L) 12.9(L)  Hematocrit 39.0 - 52.0 % 33.5(L) 34.7(L) 37.8(L)  Platelets 150 - 400 K/uL 127(L) 118(L) 128.0(L)   . CBC    Component Value Date/Time   WBC 5.7 02/28/2018 0830   RBC 3.42 (L) 02/28/2018 0830   HGB 11.4 (L) 02/28/2018 0830   HGB 11.6 (L) 02/13/2018 1550   HCT 33.5 (L) 02/28/2018 0830   PLT 127 (L) 02/28/2018 0830   PLT 118 (L) 02/13/2018  1550   MCV 98.0 02/28/2018 0830   MCH 33.3 02/28/2018 0830   MCHC 34.0 02/28/2018 0830   RDW 13.9 02/28/2018 0830   LYMPHSABS 2.1 02/28/2018 0830   MONOABS 0.3 02/28/2018 0830   EOSABS 0.1 02/28/2018 0830   BASOSABS 0.0 02/28/2018 0830    . CMP Latest Ref Rng & Units 02/20/2018 02/13/2018 09/28/2017  Glucose 70 - 99 mg/dL 158(H) 131 294(H)  BUN 6 - 23 mg/dL '12 12 13  ' Creatinine 0.40 - 1.50 mg/dL 1.02 1.17 1.17  Sodium 135 - 145 mEq/L 142 140 136  Potassium 3.5 - 5.1 mEq/L 4.8 4.7 4.3  Chloride 96 - 112 mEq/L 106 107 103  CO2 19 - 32 mEq/L '27 27 24  ' Calcium 8.4 - 10.5 mg/dL 9.2 9.7 8.9  Total Protein 6.4 - 8.3 g/dL - 6.9 6.8  Total Bilirubin 0.2 - 1.2 mg/dL - 0.3 0.3  Alkaline Phos 40 - 150 U/L - 92 91  AST 5 - 34 U/L - 28 18  ALT 0 - 55 U/L - 19 12   02/28/18 Surgical Pathology:   02/28/18 Cytogenetics:    RADIOGRAPHIC STUDIES: I have personally reviewed the radiological images as listed and agreed with the findings in the report. Ct Biopsy  Result Date: 02/28/2018 INDICATION: 77 year old male with anemia and thrombocytopenia. He presents for CT-guided bone marrow biopsy. EXAM: CT GUIDED BONE MARROW ASPIRATION AND CORE BIOPSY Interventional Radiologist:  Criselda Peaches, MD MEDICATIONS: None. ANESTHESIA/SEDATION: Moderate (conscious) sedation was employed during this procedure. A total of 2 milligrams versed and 100 micrograms fentanyl were administered intravenously. The patient's level of consciousness and vital signs were  monitored continuously by radiology nursing throughout the procedure under my direct supervision. Total monitored sedation time: 10 minutes FLUOROSCOPY TIME:  Fluoroscopy Time: 0 minutes 0 seconds (0 mGy). COMPLICATIONS: None immediate. Estimated blood loss: <25 mL PROCEDURE: Informed written consent was obtained from the patient after a thorough discussion of the procedural risks, benefits and alternatives. All questions were addressed. Maximal Sterile  Barrier Technique was utilized including caps, mask, sterile gowns, sterile gloves, sterile drape, hand hygiene and skin antiseptic. A timeout was performed prior to the initiation of the procedure. The patient was positioned prone and non-contrast localization CT was performed of the pelvis to demonstrate the iliac marrow spaces. Maximal barrier sterile technique utilized including caps, mask, sterile gowns, sterile gloves, large sterile drape, hand hygiene, and betadine prep. Under sterile conditions and local anesthesia, an 11 gauge coaxial bone biopsy needle was advanced into the right iliac marrow space. Needle position was confirmed with CT imaging. Initially, bone marrow aspiration was performed. Next, the 11 gauge outer cannula was utilized to obtain a right iliac bone marrow core biopsy. Needle was removed. Hemostasis was obtained with compression. The patient tolerated the procedure well. Samples were prepared with the cytotechnologist. IMPRESSION: Technically successful CT-guided bone marrow aspiration and core biopsy. Electronically Signed   By: Jacqulynn Cadet M.D.   On: 02/28/2018 11:55   Ct Bone Marrow Biopsy & Aspiration  Result Date: 02/28/2018 INDICATION: 77 year old male with anemia and thrombocytopenia. He presents for CT-guided bone marrow biopsy. EXAM: CT GUIDED BONE MARROW ASPIRATION AND CORE BIOPSY Interventional Radiologist:  Criselda Peaches, MD MEDICATIONS: None. ANESTHESIA/SEDATION: Moderate (conscious) sedation was employed during this procedure. A total of 2 milligrams versed and 100 micrograms fentanyl were administered intravenously. The patient's level of consciousness and vital signs were monitored continuously by radiology nursing throughout the procedure under my direct supervision. Total monitored sedation time: 10 minutes FLUOROSCOPY TIME:  Fluoroscopy Time: 0 minutes 0 seconds (0 mGy). COMPLICATIONS: None immediate. Estimated blood loss: <25 mL PROCEDURE: Informed  written consent was obtained from the patient after a thorough discussion of the procedural risks, benefits and alternatives. All questions were addressed. Maximal Sterile Barrier Technique was utilized including caps, mask, sterile gowns, sterile gloves, sterile drape, hand hygiene and skin antiseptic. A timeout was performed prior to the initiation of the procedure. The patient was positioned prone and non-contrast localization CT was performed of the pelvis to demonstrate the iliac marrow spaces. Maximal barrier sterile technique utilized including caps, mask, sterile gowns, sterile gloves, large sterile drape, hand hygiene, and betadine prep. Under sterile conditions and local anesthesia, an 11 gauge coaxial bone biopsy needle was advanced into the right iliac marrow space. Needle position was confirmed with CT imaging. Initially, bone marrow aspiration was performed. Next, the 11 gauge outer cannula was utilized to obtain a right iliac bone marrow core biopsy. Needle was removed. Hemostasis was obtained with compression. The patient tolerated the procedure well. Samples were prepared with the cytotechnologist. IMPRESSION: Technically successful CT-guided bone marrow aspiration and core biopsy. Electronically Signed   By: Jacqulynn Cadet M.D.   On: 02/28/2018 11:55    ASSESSMENT & PLAN:   77 y.o. male with  1. 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 BM Bx - no overt pathology or MDS. Increased ring sideroblasts. ? etiology 2. 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  -Begin using a Vitamin B complex (increased ring sideroblasts ?  pyridoxin deficiency) and continue 1061mg Vitamin B12 replacement -Reviewed 02/28/18 surgical pathology report which could not rule out MDS, and noted an increase in ring sideroblasts -BM cytogenetics report from 02/28/18 was  unremarkable -Wouldn't necessarily recommend replacing testosterone given BPH history -patient notes being sober with ETOH use at this time . 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 -Will see pt back in 3 months    RTC with Dr KIrene Limboin 3 months with labs   All of the patients questions were answered with apparent satisfaction. The patient knows to call the clinic with any problems, questions or concerns.  The toal time spent in the appt was 30 minutes and more than 50% was on counseling and direct patient cares.   Orders Placed This Encounter  Procedures  . CBC with Differential/Platelet    Standing Status:   Future    Standing Expiration Date:   04/24/2019  . [place]    Standing Status:   Future    Standing Expiration Date:   03/21/2019  . Vitamin B12    Standing Status:   Future    Standing Expiration Date:   03/20/2019  . Methylmalonic acid, serum    Standing Status:   Future    Standing Expiration Date:   03/20/2019  . Homocysteine, serum    Standing Status:   Future    Standing Expiration Date:   03/20/2019  . Folate RBC    Standing Status:   Future    Standing Expiration Date:   03/21/2019  . Lead, blood (adult age 1324yrs or greater)    Standing Status:   Future    Standing Expiration Date:   03/20/2019    Order Specific Question:   [place]    Answer:   [place][[id number]]       GSullivan LoneMD MS AAHIVMS SLauderdale Community HospitalCNash General HospitalHematology/Oncology Physician [facility] (Office):       [phone](Work cell):  [phone](Fax):           [phone] 03/20/2018 1:41 PM  I, SBaldwin Jamaica am acting as a sEducation administratorfor Dr KIrene Limbo   .I have reviewed the above documentation for accuracy and completeness, and I agree with the above. .Brunetta GeneraMD

## 2018-03-20 ENCOUNTER — Inpatient Hospital Stay: Payer: Medicare HMO | Attending: Hematology and Oncology | Admitting: Hematology

## 2018-03-20 ENCOUNTER — Telehealth: Payer: Self-pay | Admitting: Hematology

## 2018-03-20 VITALS — BP 140/74 | HR 83 | Temp 98.1°F | Resp 19 | Ht 73.0 in | Wt 209.1 lb

## 2018-03-20 DIAGNOSIS — E291 Testicular hypofunction: Secondary | ICD-10-CM | POA: Diagnosis not present

## 2018-03-20 DIAGNOSIS — E538 Deficiency of other specified B group vitamins: Secondary | ICD-10-CM | POA: Diagnosis not present

## 2018-03-20 DIAGNOSIS — J449 Chronic obstructive pulmonary disease, unspecified: Secondary | ICD-10-CM | POA: Diagnosis not present

## 2018-03-20 DIAGNOSIS — N400 Enlarged prostate without lower urinary tract symptoms: Secondary | ICD-10-CM | POA: Diagnosis not present

## 2018-03-20 DIAGNOSIS — D696 Thrombocytopenia, unspecified: Secondary | ICD-10-CM

## 2018-03-20 DIAGNOSIS — D7589 Other specified diseases of blood and blood-forming organs: Secondary | ICD-10-CM | POA: Diagnosis not present

## 2018-03-20 DIAGNOSIS — Z87891 Personal history of nicotine dependence: Secondary | ICD-10-CM | POA: Diagnosis not present

## 2018-03-20 DIAGNOSIS — I10 Essential (primary) hypertension: Secondary | ICD-10-CM

## 2018-03-20 DIAGNOSIS — D649 Anemia, unspecified: Secondary | ICD-10-CM | POA: Diagnosis not present

## 2018-03-20 DIAGNOSIS — F1021 Alcohol dependence, in remission: Secondary | ICD-10-CM | POA: Diagnosis not present

## 2018-03-20 DIAGNOSIS — E119 Type 2 diabetes mellitus without complications: Secondary | ICD-10-CM

## 2018-03-20 NOTE — Telephone Encounter (Signed)
Gave pt avs and calendar with appts per 6/11 los.  °

## 2018-04-02 ENCOUNTER — Ambulatory Visit (HOSPITAL_COMMUNITY)
Admission: RE | Admit: 2018-04-02 | Discharge: 2018-04-02 | Disposition: A | Discharge status: Home / Self Care | Payer: Medicare HMO | Source: Ambulatory Visit | Attending: Hematology and Oncology | Admitting: Hematology and Oncology

## 2018-04-02 DIAGNOSIS — R932 Abnormal findings on diagnostic imaging of liver and biliary tract: Secondary | ICD-10-CM | POA: Insufficient documentation

## 2018-04-02 DIAGNOSIS — Q631 Lobulated, fused and horseshoe kidney: Secondary | ICD-10-CM | POA: Insufficient documentation

## 2018-04-02 DIAGNOSIS — D696 Thrombocytopenia, unspecified: Secondary | ICD-10-CM | POA: Diagnosis not present

## 2018-04-02 DIAGNOSIS — R161 Splenomegaly, not elsewhere classified: Secondary | ICD-10-CM | POA: Diagnosis not present

## 2018-04-02 DIAGNOSIS — R935 Abnormal findings on diagnostic imaging of other abdominal regions, including retroperitoneum: Secondary | ICD-10-CM | POA: Diagnosis not present

## 2018-04-17 ENCOUNTER — Ambulatory Visit: Payer: Medicare HMO | Admitting: Podiatry

## 2018-04-17 ENCOUNTER — Other Ambulatory Visit: Payer: Self-pay

## 2018-04-17 DIAGNOSIS — M79674 Pain in right toe(s): Secondary | ICD-10-CM | POA: Diagnosis not present

## 2018-04-17 DIAGNOSIS — M79675 Pain in left toe(s): Secondary | ICD-10-CM

## 2018-04-17 DIAGNOSIS — E1149 Type 2 diabetes mellitus with other diabetic neurological complication: Secondary | ICD-10-CM | POA: Diagnosis not present

## 2018-04-17 DIAGNOSIS — L84 Corns and callosities: Secondary | ICD-10-CM | POA: Diagnosis not present

## 2018-04-17 DIAGNOSIS — M2041 Other hammer toe(s) (acquired), right foot: Secondary | ICD-10-CM

## 2018-04-17 DIAGNOSIS — M2042 Other hammer toe(s) (acquired), left foot: Secondary | ICD-10-CM

## 2018-04-17 DIAGNOSIS — B351 Tinea unguium: Secondary | ICD-10-CM

## 2018-04-18 NOTE — Progress Notes (Signed)
Subjective: 77 y.o. returns the office today for painful, elongated, thickened toenails which he cannot trim himself. Denies any redness or drainage around the nails.  He also has a pre-ulcerative callus of the right foot.  He states that the callus is better than what it has been previously. He is requesting new diabetic shoes.  Denies any drainage or pus or any swelling or redness to his feet.  He has no new concerns today.   PCP: [name], MD  Objective: AAO 3, NAD DP/PT pulses palpable, CRT less than 3 seconds Protective sensation decreased with Simms Weinstein monofilament Nails hypertrophic, dystrophic, elongated, brittle, discolored 10. There is tenderness overlying the nails 1-5 bilaterally. There is no surrounding erythema or drainage along the nail sites. On the right foot submetatarsal one is a hyperkeratotic lesion.  Upon debridement the area is pre-ulcerative but there is no definitive opening the skin but there was blood underneath the callus.   No other open lesions or pre-ulcerative lesions are identified. Hammertoes are present.  No pain with calf compression, swelling, warmth, erythema.  Assessment: Patient presents with symptomatic onychomycosis; pre-ulcerative lesion right submetatarsal 1  Plan: -Treatment options including alternatives, risks, complications were discussed -Nails sharply debrided 10 without complication/bleeding. -Pre-ulcerative lesion right foot was sharply debrided without any complications or bleeding.  Moisturizer to the area.  -Paperwork was completed and given to [facility] for diabetic shoes. Given neuropathy, pre-ulcerative callus and digital deformities he would benefit from new diabetic shoes.  -Discussed daily foot inspection. If there are any changes, to call the office immediately.  -Follow-up in 3 months or sooner if any problems are to arise. In the meantime, encouraged to call the office with any questions, concerns, changes  symptoms.  Celesta Gentile, DPM

## 2018-05-16 ENCOUNTER — Ambulatory Visit: Payer: Medicare HMO | Admitting: Orthotics

## 2018-05-24 DIAGNOSIS — L570 Actinic keratosis: Secondary | ICD-10-CM | POA: Diagnosis not present

## 2018-05-24 DIAGNOSIS — X32XXXD Exposure to sunlight, subsequent encounter: Secondary | ICD-10-CM | POA: Diagnosis not present

## 2018-05-24 DIAGNOSIS — D225 Melanocytic nevi of trunk: Secondary | ICD-10-CM | POA: Diagnosis not present

## 2018-05-24 DIAGNOSIS — L821 Other seborrheic keratosis: Secondary | ICD-10-CM | POA: Diagnosis not present

## 2018-05-31 ENCOUNTER — Ambulatory Visit: Payer: Medicare HMO | Admitting: Family Medicine

## 2018-06-01 ENCOUNTER — Other Ambulatory Visit: Payer: Self-pay | Admitting: Family Medicine

## 2018-06-04 ENCOUNTER — Encounter: Payer: Self-pay | Admitting: Family Medicine

## 2018-06-04 ENCOUNTER — Ambulatory Visit (INDEPENDENT_AMBULATORY_CARE_PROVIDER_SITE_OTHER): Payer: Medicare HMO | Admitting: Family Medicine

## 2018-06-04 VITALS — BP 130/80 | HR 101 | Temp 98.1°F | Ht 73.0 in | Wt 210.0 lb

## 2018-06-04 DIAGNOSIS — E1142 Type 2 diabetes mellitus with diabetic polyneuropathy: Secondary | ICD-10-CM

## 2018-06-04 DIAGNOSIS — I10 Essential (primary) hypertension: Secondary | ICD-10-CM

## 2018-06-04 DIAGNOSIS — E785 Hyperlipidemia, unspecified: Secondary | ICD-10-CM

## 2018-06-04 LAB — POCT GLYCOSYLATED HEMOGLOBIN (HGB A1C): HEMOGLOBIN A1C: 6.4 % — AB (ref 4.0–5.6)

## 2018-06-04 NOTE — Assessment & Plan Note (Signed)
S: well controlled on crestor 10mg  Lab Results  Component Value Date   CHOL 156 12/01/2015   HDL 35.50 (L) 12/01/2015   LDLCALC 95 12/01/2015   LDLDIRECT 58.0 03/30/2017   TRIG 124.0 12/01/2015   CHOLHDL 4 12/01/2015   A/P: update lipid panel. Ideally LDL will remain below 70 but will tolerate up to 100.

## 2018-06-04 NOTE — Progress Notes (Signed)
Subjective:  [name] is a 77 y.o. year old very pleasant male patient who presents for/with See problem oriented charting ROS-no chest pain, stable shortness of breath.  Slight edema.  Notes bilateral knee pain  Past Medical History-  Patient Active Problem List   Diagnosis Date Noted  . 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 CKD (chronic kidney disease), stage III ([place]) 05/06/2015    Priority: High  . ILD (interstitial lung disease) ([place]) 07/05/2012    Priority: High  . COPD (chronic obstructive pulmonary disease) ([place]) 12/15/2009    Priority: High  . Chronic back pain. Off narcotics 06/05/17 due to negative UDS for opiates x2. Full history 06/12/14. Pain contract signed.  05/02/2007    Priority: High  . DM (diabetes mellitus) type II controlled, neurological manifestation ([place]) 04/06/2007    Priority: High  . Thrombocytopenia ([place]) 12/21/2015    Priority: Medium  . B12 deficiency 08/20/2015    Priority: Medium  . Diabetic polyneuropathy associated with type 2 diabetes mellitus ([place]) 08/07/2015    Priority: Medium  . Fatty liver 11/04/2014    Priority: Medium  . BPH (benign prostatic hyperplasia) 06/20/2007    Priority: Medium  . Depression 05/02/2007    Priority: Medium  . Hyperlipidemia 04/06/2007    Priority: Medium  . Essential hypertension 04/06/2007    Priority: Medium  . Osteoarthritis 04/06/2007    Priority: Medium  . GERD (gastroesophageal reflux disease) 09/30/2017    Priority: Low  . 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 positive 03/29/2016    Priority: Low  . 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 Anemia 03/05/2018  . Diplopia 06/05/2015  . PCO (posterior capsular opacification) 06/19/2013  . 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 Pseudophakia of left eye 04/10/2013  . Nuclear cataract 01/20/2012  . 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    Medications- reviewed and updated Current Outpatient Medications  Medication Sig Dispense  Refill  . acyclovir (ZOVIRAX) 400 MG tablet TAKE 1 TABLET TWICE DAILY 180 tablet 2  . albuterol (PROVENTIL HFA;VENTOLIN HFA) 108 (90 Base) MCG/ACT inhaler Inhale 2 puffs into the lungs every 6 (six) hours as needed for wheezing or shortness of breath. 1 Inhaler 5  . amitriptyline (ELAVIL) 50 MG tablet TAKE 1 TABLET AT BEDTIME 90 tablet 3  . amLODipine (NORVASC) 10 MG tablet TAKE 1 TABLET EVERY DAY 90 tablet 3  . aspirin 81 MG tablet Take 81 mg by mouth daily.      . Cholecalciferol 1000 UNITS capsule Take 1 capsule (1,000 Units total) by mouth 2 (two) times daily.    . clotrimazole-betamethasone (LOTRISONE) cream Apply 1 application topically 2 (two) times daily. For 7 days maximum. Stop if worsening symptoms. 45 g 1  . diclofenac sodium (VOLTAREN) 1 % GEL Apply 2 g topically 4 (four) times daily. 100 g 3  . famotidine (PEPCID) 20 MG tablet TAKE 1 TABLET (20 MG TOTAL) BY MOUTH 2 (TWO) TIMES DAILY. 180 tablet 3  . finasteride (PROSCAR) 5 MG tablet TAKE 1 TABLET EVERY DAY 90 tablet 3  . gabapentin (NEURONTIN) 300 MG capsule TAKE 1 CAPSULE TWICE DAILY 180 capsule 1  . lisinopril (ZESTRIL) 2.5 MG tablet Take 1 tablet (2.5 mg total) by mouth daily. 30 tablet 5  . metFORMIN (GLUCOPHAGE) 1000 MG tablet Take 1 tablet (1,000 mg total) by mouth 2 (two) times daily with a meal. 180 tablet 3  . metFORMIN (GLUCOPHAGE) 500 MG tablet TAKE 1 TABLET EVERY DAY  WITH BREAKFAST 90 tablet 3  . Multiple Vitamin (MULTIVITAMIN) capsule Take 1 capsule by mouth daily.      . NONFORMULARY OR COMPOUNDED ITEM [facility]  Peripheral Neuropathy Cream- Bupivacaine 1%, Doxepin 3%, Gabapentin 6%, Pentoxifylline 3%, Topiramate 1% Apply 1-2 grams to affected area 3-4 times daily Qty. 120 gm 3 refills    . omeprazole (PRILOSEC) 20 MG capsule     . rosuvastatin (CRESTOR) 10 MG tablet TAKE 1 TABLET EVERY DAY 90 tablet 3  . tamsulosin (FLOMAX) 0.4 MG CAPS capsule TAKE 1 CAPSULE EVERY DAY 90 capsule 1  . TRUEPLUS LANCETS 28G  MISC Use to test blood sugars two times daily. Dx: E11.9 200 each 3   No current facility-administered medications for this visit.     Objective: BP 130/80 (BP Location: Left Arm, Patient Position: Sitting, Cuff Size: Normal)   Pulse (!) 101   Temp 98.1 F (36.7 C) (Oral)   Ht 6\' 1"  (1.854 m)   Wt 210 lb (95.3 kg)   SpO2 95%   BMI 27.71 kg/m  Gen: NAD, resting comfortably CV: slightly tachycardic but regular no murmurs rubs or gallops Lungs: CTAB no crackles, wheeze, rhonchi Abdomen: soft/nontender/nondistended/normal bowel sounds. Ext: trace edema Skin: warm, dry  Assessment/Plan:  Other notes: 1.  Mild pain in left leg in small strip going all the way down to his foot. He thinks about 6 months.  We discussed likely related to his back, could be related to neuropathy.  Only mild issue so we will monitor  Hypertension S: controlled on amlodipine 10mg , lisinopril 2.5mg  BP Readings from Last 3 Encounters:  06/04/18 130/80  03/20/18 140/74  02/28/18 134/73  A/P: We discussed blood pressure goal of <140/90. Continue current meds  DM (diabetes mellitus) type II controlled, neurological manifestation ([place]) S: last visit- controlled on metformin 1g AM, 500mg  PM.  CBGs-  after supper 220 on last check, luckily a1c looks ok Lab Results  Component Value Date   HGBA1C 6.4 (A) 06/04/2018   HGBA1C 6.9 01/29/2018   HGBA1C 7.7 (H) 09/28/2017   A/P: Continue current medications, thrilled with progress.  Will stretch next visit 5 months for physical.  His neuropathy is stable  Hyperlipidemia S: well controlled on crestor 10mg  Lab Results  Component Value Date   CHOL 156 12/01/2015   HDL 35.50 (L) 12/01/2015   LDLCALC 95 12/01/2015   LDLDIRECT 58.0 03/30/2017   TRIG 124.0 12/01/2015   CHOLHDL 4 12/01/2015   A/P: update lipid panel. Ideally LDL will remain below 70 but will tolerate up to 100.    Future Appointments  Date Time Provider [place]  06/19/2018  1:45 PM  Trula Slade, DPM [facility]  06/21/2018  1:15 PM [facility] 2 [facility] None  06/21/2018  1:40 PM Brunetta Genera, MD [facility] None  10/22/2018  1:30 PM [name], MD [facility]   Return in about 5 months (around 11/04/2018) for physical.  Lab/Order associations: Controlled type 2 diabetes mellitus with diabetic polyneuropathy, without long-term current use of insulin (HCC)  Hyperlipidemia, unspecified hyperlipidemia type - Plan: Lipid panel  Essential hypertension  Diabetic polyneuropathy associated with type 2 diabetes mellitus ([place]) - Plan: POCT glycosylated hemoglobin (Hb A1C)  Return precautions advised.  [name], MD

## 2018-06-04 NOTE — Assessment & Plan Note (Signed)
S: last visit- controlled on metformin 1g AM, 500mg  PM.  CBGs-  after supper 220 on last check, luckily a1c looks ok Lab Results  Component Value Date   HGBA1C 6.4 (A) 06/04/2018   HGBA1C 6.9 01/29/2018   HGBA1C 7.7 (H) 09/28/2017   A/P: Continue current medications, thrilled with progress.  Will stretch next visit 5 months for physical.  His neuropathy is stable

## 2018-06-04 NOTE — Patient Instructions (Signed)
Please stop by lab before you go To check on cholesterol  Lab Results  Component Value Date   HGBA1C 6.4 (A) 06/04/2018  below goal of 7.5!!!! Great news for diabetes

## 2018-06-05 LAB — LIPID PANEL
CHOL/HDL RATIO: 3
Cholesterol: 120 mg/dL (ref 0–200)
HDL: 47.3 mg/dL (ref >39.00)
LDL Cholesterol: 47 mg/dL (ref 0–99)
NONHDL: 72.97
Triglycerides: 132 mg/dL (ref 0.0–149.0)
VLDL: 26.4 mg/dL (ref 0.0–40.0)

## 2018-06-19 ENCOUNTER — Ambulatory Visit: Payer: Medicare HMO | Admitting: Orthotics

## 2018-06-19 ENCOUNTER — Ambulatory Visit: Payer: Medicare HMO | Admitting: Podiatry

## 2018-06-19 DIAGNOSIS — M2041 Other hammer toe(s) (acquired), right foot: Secondary | ICD-10-CM | POA: Diagnosis not present

## 2018-06-19 DIAGNOSIS — E1149 Type 2 diabetes mellitus with other diabetic neurological complication: Secondary | ICD-10-CM | POA: Diagnosis not present

## 2018-06-19 DIAGNOSIS — M79675 Pain in left toe(s): Secondary | ICD-10-CM

## 2018-06-19 DIAGNOSIS — M79674 Pain in right toe(s): Secondary | ICD-10-CM | POA: Diagnosis not present

## 2018-06-19 DIAGNOSIS — M2042 Other hammer toe(s) (acquired), left foot: Secondary | ICD-10-CM | POA: Diagnosis not present

## 2018-06-19 DIAGNOSIS — L84 Corns and callosities: Secondary | ICD-10-CM

## 2018-06-19 DIAGNOSIS — B351 Tinea unguium: Secondary | ICD-10-CM

## 2018-06-19 NOTE — Progress Notes (Signed)
Patient had appointment today for definitive and final diabetic shoe fitting and delivery.  Patient was seen by Betha, C.Ped, [facility].   The inserts fit well and accomplished full contact with the plantar surface of the foot bilateral; the shoes fit well and offered forefoot freedom, no noticible heel slippage, and good toe clearance w/ the insert in place.  Patient was advised to monitor of any skin irritation, breakdown.  Patient was satisfied with fit and function. 

## 2018-06-20 NOTE — Progress Notes (Signed)
Subjective: 77 y.o. returns the office today for painful, elongated, thickened toenails which he cannot trim himself. Denies any redness or drainage around the nails.  He also has a pre-ulcerative callus of the right foot although is getting better.  He also presents today to get diabetic shoes and inserts.  He has no other concerns today.  Denies any drainage or pus or any swelling or redness to his feet.  He has no new concerns today.   PCP: [name], MD  Objective: AAO 3, NAD DP/PT pulses palpable, CRT less than 3 seconds Protective sensation decreased with Simms Weinstein monofilament Nails hypertrophic, dystrophic, elongated, brittle, discolored 10. There is tenderness overlying the nails 1-5 bilaterally. There is no surrounding erythema or drainage along the nail sites. On the right foot submetatarsal one is a hyperkeratotic lesion.  Upon debridement the area is pre-ulcerative but there is no definitive opening the skin but there was blood underneath the callus.   No other open lesions or pre-ulcerative lesions are identified. Hammertoes are present.  No pain with calf compression, swelling, warmth, erythema.  Assessment: Patient presents with symptomatic onychomycosis; pre-ulcerative lesion right submetatarsal 1  Plan: -Treatment options including alternatives, risks, complications were discussed -Nails sharply debrided 10 without complication/bleeding. -Pre-ulcerative lesion right foot was sharply debrided without any complications or bleeding.  Moisturizer to the area.  -Diabetic shoes were dispensed today with the inserts by [facility].  -Daily foot inspection discussed  Return in about 3 months (around 09/18/2018).  Trula Slade DPM

## 2018-06-21 ENCOUNTER — Inpatient Hospital Stay: Payer: Medicare HMO

## 2018-06-21 ENCOUNTER — Inpatient Hospital Stay: Payer: Medicare HMO | Attending: Hematology and Oncology | Admitting: Hematology

## 2018-06-21 ENCOUNTER — Encounter: Payer: Self-pay | Admitting: Hematology

## 2018-06-21 VITALS — BP 143/76 | HR 105 | Temp 98.2°F | Resp 18 | Wt 208.9 lb

## 2018-06-21 DIAGNOSIS — D649 Anemia, unspecified: Secondary | ICD-10-CM

## 2018-06-21 DIAGNOSIS — D696 Thrombocytopenia, unspecified: Secondary | ICD-10-CM

## 2018-06-21 DIAGNOSIS — I10 Essential (primary) hypertension: Secondary | ICD-10-CM | POA: Diagnosis not present

## 2018-06-21 DIAGNOSIS — J449 Chronic obstructive pulmonary disease, unspecified: Secondary | ICD-10-CM | POA: Diagnosis not present

## 2018-06-21 DIAGNOSIS — E119 Type 2 diabetes mellitus without complications: Secondary | ICD-10-CM | POA: Insufficient documentation

## 2018-06-21 DIAGNOSIS — E538 Deficiency of other specified B group vitamins: Secondary | ICD-10-CM

## 2018-06-21 LAB — CBC WITH DIFFERENTIAL/PLATELET
Basophils Absolute: 0.1 10*3/uL (ref 0.0–0.1)
Basophils Relative: 1 %
EOS ABS: 0.1 10*3/uL (ref 0.0–0.5)
EOS PCT: 1 %
HCT: 34 % — ABNORMAL LOW (ref 38.4–49.9)
HEMOGLOBIN: 11.5 g/dL — AB (ref 13.0–17.1)
LYMPHS ABS: 1.8 10*3/uL (ref 0.9–3.3)
Lymphocytes Relative: 30 %
MCH: 33.8 pg — AB (ref 27.2–33.4)
MCHC: 33.9 g/dL (ref 32.0–36.0)
MCV: 99.7 fL — ABNORMAL HIGH (ref 79.3–98.0)
MONO ABS: 0.4 10*3/uL (ref 0.1–0.9)
MONOS PCT: 6 %
Neutro Abs: 3.9 10*3/uL (ref 1.5–6.5)
Neutrophils Relative %: 62 %
PLATELETS: 125 10*3/uL — AB (ref 140–400)
RBC: 3.41 MIL/uL — ABNORMAL LOW (ref 4.20–5.82)
RDW: 14.5 % (ref 11.0–14.6)
WBC: 6.2 10*3/uL (ref 4.0–10.3)

## 2018-06-21 LAB — CMP (CANCER CENTER ONLY)
ALK PHOS: 81 U/L (ref 38–126)
ALT: 16 U/L (ref 0–44)
ANION GAP: 10 (ref 5–15)
AST: 22 U/L (ref 15–41)
Albumin: 3.8 g/dL (ref 3.5–5.0)
BUN: 13 mg/dL (ref 8–23)
CALCIUM: 9.5 mg/dL (ref 8.9–10.3)
CO2: 24 mmol/L (ref 22–32)
Chloride: 106 mmol/L (ref 98–111)
Creatinine: 1.15 mg/dL (ref 0.61–1.24)
GFR, Estimated: 60 mL/min — ABNORMAL LOW (ref >60)
Glucose, Bld: 182 mg/dL — ABNORMAL HIGH (ref 70–99)
POTASSIUM: 5 mmol/L (ref 3.5–5.1)
Sodium: 140 mmol/L (ref 135–145)
Total Bilirubin: 0.4 mg/dL (ref 0.3–1.2)
Total Protein: 6.7 g/dL (ref 6.5–8.1)

## 2018-06-21 LAB — VITAMIN B12: Vitamin B-12: 836 pg/mL (ref 180–914)

## 2018-06-21 NOTE — Progress Notes (Signed)
HEMATOLOGY/ONCOLOGY CONSULTATION NOTE  Date of Service: 06/21/2018  Patient Care Team: [name], MD as PCP - General (Family Medicine)  CHIEF COMPLAINTS/PURPOSE OF CONSULTATION:  Thrombocytopenia  HISTORY OF PRESENTING ILLNESS:   [name] is a wonderful 77 y.o. 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 He is accompanied today by his wife. The pt reports that he is doing well overall.   The pt reports that he has had low energy levels from the past 3-4 years since he retired. Today he feels fatigued as well.  He has been taking Metformin for the last 10 years and has recently begun replacing Vitamin B12. He has neuropathy in his feet and has experienced some falls and uses a cane at night. He is also taking Finasteride  Most recent lab results (02/28/18) of CBC  is as follows: all values are WNL except for RBC at 3.42, HGB at 11.4, HCT at 33.5, Platelets at 127k. Testosterone 02/13/18 was lower at 185  On review of systems, pt reports neuropathy in his feet, some fatigue, stable back pain, some ankle swelling, and denies abdominal pains and any other symptoms.   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   Interval History:   PASQUAL FARIAS returns today for management and evaluation of his Thrombocytopenia. The patient's last visit with [place] was on 03/20/18. He is accompanied today by his wife. The pt reports that he is doing well overall.   The pt reports that he has not developed any new concerns since his last visit. 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 He also endorses stable joint pains and denies any new constitutional symptoms.  The pt continues to take Vitamin B12  replacement and Vitamin B complex.    The pt also notes that he takes Flomax and does not urinate very frequently throughout the day.   Lab results today (06/21/18) of CBC w/diff is as follows: all values are WNL except for RBC at 3.41, HGB at 11.5, HCT at 34.0, MCV at 99.7, MCH at 33.8, PLT at 125k. 06/21/18 CMP, Vitamin B12, Lead blood, Methylmalonic acid, Homocysteine and Folic acid levels are all pending.   On review of systems, pt reports bruising easily, good energy levels, stable joint aches, and denies fevers, chills, night sweats, unexpected weight loss, concerns for bleeding, abdominal pains, changes in bowel habits, and any other symptoms.   MEDICAL HISTORY:  Past Medical History:  Diagnosis Date  . Benign prostatic hypertrophy   . COPD (chronic obstructive pulmonary disease) ([place]) December 15, 2009   FEV1 2.30 (70%) ratio 63 no better with B2 and DLCO 18/6 (73%) corrects to 106%  . Coughing December 18, 2009   Sinus CT : Minor mucosal thickening at the Right Frontoethmoidal recess, otherwise  negative sinuses  . Degenerative joint disease   . Depression   . Diabetes mellitus type II   . Hyperlipidemia   . Hypertension August 12, 2009   try off ACE for upper airway cough  . Low back pain   . Otitis externa 07/30/2012    SURGICAL HISTORY: Past Surgical History:  Procedure Laterality Date  . dvt  1980    SOCIAL HISTORY: Social  History   Socioeconomic History  . Marital status: Married    Spouse name: Not on file  . Number of children: Not on file  . Years of education: Not on file  . Highest education level: Not on file  Occupational History  . Occupation: retired    Comment: maintenence work  Scientific laboratory technician  . Financial resource strain: Not on file  . Food insecurity:    Worry: Not on file    Inability: Not on file  . Transportation needs:    Medical: Not on file    Non-medical: Not on file  Tobacco Use  . Smoking status: Former Smoker    Packs/day: 4.00     Years: 20.00    Pack years: 80.00    Types: Cigarettes    Last attempt to quit: 09/18/1984    Years since quitting: 33.7  . Smokeless tobacco: Never Used  . Tobacco comment: quit in 1980  Substance and Sexual Activity  . 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 Drug use: No  . Sexual activity: Yes  Lifestyle  . Physical activity:    Days per week: Not on file    Minutes per session: Not on file  . Stress: Not on file  Relationships  . Social connections:    Talks on phone: Not on file    Gets together: Not on file    Attends religious service: Not on file    Active member of club or organization: Not on file    Attends meetings of clubs or organizations: Not on file    Relationship status: Not on file  . Intimate partner violence:    Fear of current or ex partner: Not on file    Emotionally abused: Not on file    Physically abused: Not on file    Forced sexual activity: Not on file  Other Topics Concern  . Not on file  Social History Narrative   Married 53 years in 2015. 4 kids (2 sons) and 3 grandkids.    Lived in [place], [place] wholel life      Retired from [employer], going to [employer] where they have a camper             FAMILY HISTORY: Family History  Problem Relation Age of Onset  . Cancer Mother        melanoma  . Stroke Father     ALLERGIES:  has No Known Allergies.  MEDICATIONS:  Current Outpatient Medications  Medication Sig Dispense Refill  . acyclovir (ZOVIRAX) 400 MG tablet TAKE 1 TABLET TWICE DAILY 180 tablet 2  . albuterol (PROVENTIL HFA;VENTOLIN HFA) 108 (90 Base) MCG/ACT inhaler Inhale 2 puffs into the lungs every 6 (six) hours as needed for wheezing or shortness of breath. 1 Inhaler 5  . amitriptyline (ELAVIL) 50 MG tablet TAKE 1 TABLET AT BEDTIME 90 tablet 3  . amLODipine (NORVASC) 10 MG tablet TAKE 1 TABLET EVERY DAY 90 tablet 3  . aspirin 81 MG tablet Take 81 mg by mouth daily.      .  Cholecalciferol 1000 UNITS capsule Take 1 capsule (1,000 Units total) by mouth 2 (two) times daily.    . clotrimazole-betamethasone (LOTRISONE) cream Apply 1 application topically 2 (two) times daily. For 7 days maximum. Stop if worsening symptoms. 45 g 1  . diclofenac sodium (VOLTAREN) 1 % GEL Apply 2 g topically 4 (four) times daily. 100  g 3  . famotidine (PEPCID) 20 MG tablet TAKE 1 TABLET (20 MG TOTAL) BY MOUTH 2 (TWO) TIMES DAILY. 180 tablet 3  . finasteride (PROSCAR) 5 MG tablet TAKE 1 TABLET EVERY DAY 90 tablet 3  . gabapentin (NEURONTIN) 300 MG capsule TAKE 1 CAPSULE TWICE DAILY 180 capsule 1  . lisinopril (ZESTRIL) 2.5 MG tablet Take 1 tablet (2.5 mg total) by mouth daily. 30 tablet 5  . metFORMIN (GLUCOPHAGE) 1000 MG tablet Take 1 tablet (1,000 mg total) by mouth 2 (two) times daily with a meal. 180 tablet 3  . metFORMIN (GLUCOPHAGE) 500 MG tablet TAKE 1 TABLET EVERY DAY WITH BREAKFAST 90 tablet 3  . Multiple Vitamin (MULTIVITAMIN) capsule Take 1 capsule by mouth daily.      . NONFORMULARY OR COMPOUNDED ITEM [facility]  Peripheral Neuropathy Cream- Bupivacaine 1%, Doxepin 3%, Gabapentin 6%, Pentoxifylline 3%, Topiramate 1% Apply 1-2 grams to affected area 3-4 times daily Qty. 120 gm 3 refills    . omeprazole (PRILOSEC) 20 MG capsule     . rosuvastatin (CRESTOR) 10 MG tablet TAKE 1 TABLET EVERY DAY 90 tablet 3  . tamsulosin (FLOMAX) 0.4 MG CAPS capsule TAKE 1 CAPSULE EVERY DAY 90 capsule 1  . TRUEPLUS LANCETS 28G MISC Use to test blood sugars two times daily. Dx: E11.9 200 each 3   No current facility-administered medications for this visit.     REVIEW OF SYSTEMS:    A 10+ POINT REVIEW OF SYSTEMS WAS OBTAINED including neurology, dermatology, psychiatry, cardiac, respiratory, lymph, extremities, GI, GU, Musculoskeletal, constitutional, breasts, reproductive, HEENT.  All pertinent positives are noted in the HPI.  All others are negative.   PHYSICAL  EXAMINATION:  . Vitals:   06/21/18 1352  BP: (!) 143/76  Pulse: (!) 105  Resp: 18  Temp: 98.2 F (36.8 C)  SpO2: 99%   Filed Weights   06/21/18 1352  Weight: 208 lb 14.4 oz (94.8 kg)   .Body mass index is 27.56 kg/m.  GENERAL:alert, in no acute distress and comfortable SKIN: no acute rashes, no significant lesions EYES: conjunctiva are pink and non-injected, sclera anicteric OROPHARYNX: MMM, no exudates, no oropharyngeal erythema or ulceration NECK: supple, no JVD LYMPH:  no palpable lymphadenopathy in the cervical, axillary or inguinal regions LUNGS: clear to auscultation b/l with normal respiratory effort HEART: regular rate & rhythm ABDOMEN:  normoactive bowel sounds , non tender, not distended. No palpable hepatosplenomegaly.  Extremity: no pedal edema PSYCH: alert & oriented x 3 with fluent speech NEURO: no focal motor/sensory deficits   LABORATORY DATA:  I have reviewed the data as listed  . CBC Latest Ref Rng & Units 06/21/2018 02/28/2018 02/13/2018  WBC 4.0 - 10.3 K/uL 6.2 5.7 5.3  Hemoglobin 13.0 - 17.1 g/dL 11.5(L) 11.4(L) 11.6(L)  Hematocrit 38.4 - 49.9 % 34.0(L) 33.5(L) 34.7(L)  Platelets 140 - 400 K/uL 125(L) 127(L) 118(L)   . CBC    Component Value Date/Time   WBC 6.2 06/21/2018 1324   RBC 3.41 (L) 06/21/2018 1324   HGB 11.5 (L) 06/21/2018 1324   HGB 11.6 (L) 02/13/2018 1550   HCT 33.1 (L) 06/21/2018 1332   PLT 125 (L) 06/21/2018 1324   PLT 118 (L) 02/13/2018 1550   MCV 99.7 (H) 06/21/2018 1324   MCH 33.8 (H) 06/21/2018 1324   MCHC 33.9 06/21/2018 1324   RDW 14.5 06/21/2018 1324   LYMPHSABS 1.8 06/21/2018 1324   MONOABS 0.4 06/21/2018 1324   EOSABS 0.1 06/21/2018 1324   BASOSABS 0.1  06/21/2018 1324    . CMP Latest Ref Rng & Units 02/20/2018 02/13/2018 09/28/2017  Glucose 70 - 99 mg/dL 158(H) 131 294(H)  BUN 6 - 23 mg/dL 12 12 13   Creatinine 0.40 - 1.50 mg/dL 1.02 1.17 1.17  Sodium 135 - 145 mEq/L 142 140 136  Potassium 3.5 - 5.1 mEq/L 4.8  4.7 4.3  Chloride 96 - 112 mEq/L 106 107 103  CO2 19 - 32 mEq/L 27 27 24   Calcium 8.4 - 10.5 mg/dL 9.2 9.7 8.9  Total Protein 6.4 - 8.3 g/dL - 6.9 6.8  Total Bilirubin 0.2 - 1.2 mg/dL - 0.3 0.3  Alkaline Phos 40 - 150 U/L - 92 91  AST 5 - 34 U/L - 28 18  ALT 0 - 55 U/L - 19 12   Component     Latest Ref Rng & Units 06/21/2018  Folate, Hemolysate     Not Estab. ng/mL >620.0  HCT     37.5 - 51.0 % 33.1 (L)  Folate, RBC     >498 ng/mL >1,873  Methylmalonic Acid, Quantitative     0 - 378 nmol/L 294  DISCLAIMER      Comment  Specimen Type      [place]  Lead-Whole Blood     0 - 4 ug/dL 2  Vitamin B12     180 - 914 pg/mL 836  Homocysteine     0.0 - 15.0 umol/L 11.2    02/28/18 Surgical Pathology:   02/28/18 Cytogenetics:    RADIOGRAPHIC STUDIES: I have personally reviewed the radiological images as listed and agreed with the findings in the report. No results found.  ASSESSMENT & PLAN:   77 y.o. male with  1. 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 BM Bx - no overt pathology or MDS. Increased ring sideroblasts. ? etiology 2. B12 deficiency - likely related to metformin use  PLAN -Continue using a Vitamin B complex (increased ring sideroblasts ?pyridoxin deficiency) and continue 1021mcg Vitamin B12 replacement -Reviewed 02/28/18 surgical pathology report which could not rule out MDS, and noted an increase in ring sideroblasts -BM cytogenetics report from 02/28/18 was unremarkable -Wouldn't necessarily recommend replacing testosterone given BPH history -patient notes being sober with ETOH use at this time . -no overt evidence of symptomatic liver disease -- did has diffuse hepatic steatosis no splenomegaly on [place] in 2016 - if worsening thrombocytopenia might need rpt [place] abd for evaluation of liver and spleen. -Discussed pt labwork today, 06/21/18; blood counts have remained stable -on labs today lead levels not increased and normal B12 and RBC  folate. -Will see the pt back in 6 months, sooner if any new concerns    RTC with Dr Irene Limbo in 6 months with labs   All of the patients questions were answered with apparent satisfaction. The patient knows to call the clinic with any problems, questions or concerns.  The total time spent in the appt was 20 minutes and more than 50% was on counseling and direct patient cares.   Orders Placed This Encounter  Procedures  . CBC with Differential/Platelet    Standing Status:   Future    Standing Expiration Date:   07/26/2019  . CMP ([place] only)    Standing Status:   Future    Standing Expiration Date:   06/22/2019  . Vitamin B12    Standing Status:   Future    Standing Expiration Date:   06/21/2019  . Ferritin    Standing Status:  Future    Standing Expiration Date:   06/22/2019  . Iron and TIBC    Standing Status:   Future    Standing Expiration Date:   06/21/2019     (Office):       [phone] (Work cell):  [phone] (Fax):           [phone]  06/21/2018 2:11 PM  I, Baldwin [place], am acting as a Education administrator for Dr. Irene Limbo  .I have reviewed the above documentation for accuracy and completeness, and I agree with the above. Brunetta Genera MD

## 2018-06-21 NOTE — Patient Instructions (Signed)
Minimize/Avoid alcohol use as much as possible. Continue B12 and B complex as recommended.

## 2018-06-22 LAB — HOMOCYSTEINE: HOMOCYSTEINE-NORM: 11.2 umol/L (ref 0.0–15.0)

## 2018-06-22 LAB — FOLATE RBC: HEMATOCRIT: 33.1 % — AB (ref 37.5–51.0)

## 2018-06-22 LAB — LEAD, BLOOD (ADULT >= 16 YRS): LEAD-WHOLE BLOOD: 2 ug/dL (ref 0–4)

## 2018-06-24 LAB — METHYLMALONIC ACID, SERUM: Methylmalonic Acid, Quantitative: 294 nmol/L (ref 0–378)

## 2018-07-19 ENCOUNTER — Other Ambulatory Visit: Payer: Self-pay | Admitting: Family Medicine

## 2018-07-25 ENCOUNTER — Other Ambulatory Visit: Payer: Self-pay

## 2018-07-27 ENCOUNTER — Other Ambulatory Visit: Payer: Self-pay | Admitting: Family Medicine

## 2018-07-27 NOTE — Telephone Encounter (Signed)
Requested medication (s) are due for refill today:  Trueplus Lancets 28 G. - yes  Requested medication (s) are on the active medication list:  Trueplus lancets - yes  Future visit scheduled:  Yes; 10/22/18  Last Refill: 02/17/16; #200; RF x 3  Please note that the requested Tue Metrix meter, True Metrix Test Strips, and BD single use swabs are not on the pt's. Medication record.   Requested Prescriptions  Pending Prescriptions Disp Refills   TRUEPLUS LANCETS 28G MISC 200 each 3    Sig: Use to test blood sugars two times daily. Dx: E11.9     Endocrinology: Diabetes - Testing Supplies Passed - 07/27/2018 12:01 PM      Passed - Valid encounter within last 12 months    Recent Outpatient Visits          1 month ago Controlled type 2 diabetes mellitus with diabetic polyneuropathy, without long-term current use of insulin ([place])   [facility] [place], Brayton Mars, MD   5 months ago Controlled type 2 diabetes mellitus with diabetic polyneuropathy, without long-term current use of insulin [facility])   [facility] [place], Brayton Mars, MD   10 months ago Controlled type 2 diabetes mellitus with diabetic polyneuropathy, without long-term current use of insulin ([place])   [facility] [place], Brayton Mars, MD   1 year ago Chronic bilateral low back pain with left-sided sciatica   [facility] [place], MD   1 year ago Chronic bilateral low back pain without sciatica   [facility] [employer], Brayton Mars, MD      Future Appointments            In 2 months [place] [place], Brayton Mars, MD [place]

## 2018-07-27 NOTE — Telephone Encounter (Signed)
Pls see msg and advise.

## 2018-07-27 NOTE — Telephone Encounter (Signed)
Medication: Diabetic supplies- True Metrix meter, true metrix test strips BD single use swabs and TRUEPLUS LANCETS 28G MISC   James from [employer] called on behalf of pt requesting supplies listed above.   Preferred Pharmacy (with phone number or street name): [place], [place] [phone] (Phone) [phone] (Fax)

## 2018-07-28 NOTE — Telephone Encounter (Signed)
If patient is in need of lancets, test strips, swabs- I am ok with refill- just verify what patient wants and refill as needed if insurance covers

## 2018-07-30 MED ORDER — TRUEPLUS LANCETS 28G MISC: 3 refills | Status: DC

## 2018-07-30 NOTE — Telephone Encounter (Signed)
Called and left voicemail msg requesting a call back.  CRM created.

## 2018-08-02 ENCOUNTER — Telehealth: Payer: Self-pay | Admitting: Family Medicine

## 2018-08-02 NOTE — Telephone Encounter (Signed)
LVM for patient to find out how many times he is checking his glucose.

## 2018-08-10 ENCOUNTER — Other Ambulatory Visit: Payer: Self-pay | Admitting: Family Medicine

## 2018-08-13 ENCOUNTER — Other Ambulatory Visit: Payer: Self-pay

## 2018-08-13 MED ORDER — LISINOPRIL 2.5 MG PO TABS: 2.5000 mg | ORAL_TABLET | Freq: Every day | ORAL | 5 refills | Status: DC

## 2018-09-18 ENCOUNTER — Ambulatory Visit: Payer: Medicare HMO | Admitting: Podiatry

## 2018-09-18 DIAGNOSIS — M79674 Pain in right toe(s): Secondary | ICD-10-CM | POA: Diagnosis not present

## 2018-09-18 DIAGNOSIS — E1149 Type 2 diabetes mellitus with other diabetic neurological complication: Secondary | ICD-10-CM

## 2018-09-18 DIAGNOSIS — Q828 Other specified congenital malformations of skin: Secondary | ICD-10-CM

## 2018-09-18 DIAGNOSIS — M79675 Pain in left toe(s): Secondary | ICD-10-CM

## 2018-09-18 DIAGNOSIS — B351 Tinea unguium: Secondary | ICD-10-CM

## 2018-09-19 NOTE — Progress Notes (Signed)
Subjective: 77 y.o. returns the office today for painful, elongated, thickened toenails which he cannot trim himself. Denies any redness or drainage around the nails.  He also has a pre-ulcerative callus of the right foot although is getting better.  He also presents today to get diabetic shoes and inserts.  He has no other concerns today.  Denies any drainage or pus or any swelling or redness to his feet.  He has no new concerns today.   PCP: [name], MD  Objective: AAO 3, NAD DP/PT pulses palpable, CRT less than 3 seconds Protective sensation decreased with Simms Weinstein monofilament Nails hypertrophic, dystrophic, elongated, brittle, discolored 10. There is tenderness overlying the nails 1-5 bilaterally. There is no surrounding erythema or drainage along the nail sites. On the right foot submetatarsal one is a hyperkeratotic lesion as well as today there is also a lesion on the left foot submetatarsal 5.  Some mild dried blood present 7/5 left foot upon debridement the area is pre-ulcerative but there is no definitive opening the skin but there was blood underneath the callus.   No other open lesions or pre-ulcerative lesions are identified. Hammertoes are present.  No pain with calf compression, swelling, warmth, erythema.  Assessment: Patient presents with symptomatic onychomycosis; pre-ulcerative lesion right submetatarsal 1  Plan: -Treatment options including alternatives, risks, complications were discussed -Nails sharply debrided 10 without complication/bleeding. -Hyperkeratotic lesion sharp debrided x2 without any complications or bleeding.  Continue offloading to monitor for any skin breakdown. -Daily foot inspection discussed  Return in about 3 months  Trula Slade DPM

## 2018-09-28 ENCOUNTER — Other Ambulatory Visit: Payer: Self-pay | Admitting: Family Medicine

## 2018-10-10 HISTORY — PX: COLONOSCOPY: SHX174

## 2018-10-22 ENCOUNTER — Ambulatory Visit (INDEPENDENT_AMBULATORY_CARE_PROVIDER_SITE_OTHER): Payer: Medicare HMO | Admitting: Family Medicine

## 2018-10-22 ENCOUNTER — Encounter: Payer: Self-pay | Admitting: Family Medicine

## 2018-10-22 VITALS — BP 136/82 | HR 99 | Temp 97.8°F | Ht 73.0 in | Wt 213.2 lb

## 2018-10-22 DIAGNOSIS — Z6828 Body mass index (BMI) 28.0-28.9, adult: Secondary | ICD-10-CM

## 2018-10-22 DIAGNOSIS — D696 Thrombocytopenia, unspecified: Secondary | ICD-10-CM | POA: Diagnosis not present

## 2018-10-22 DIAGNOSIS — J849 Interstitial pulmonary disease, unspecified: Secondary | ICD-10-CM | POA: Diagnosis not present

## 2018-10-22 DIAGNOSIS — Z0000 Encounter for general adult medical examination without abnormal findings: Secondary | ICD-10-CM

## 2018-10-22 DIAGNOSIS — E1142 Type 2 diabetes mellitus with diabetic polyneuropathy: Secondary | ICD-10-CM

## 2018-10-22 DIAGNOSIS — N183 Chronic kidney disease, stage 3 unspecified: Secondary | ICD-10-CM

## 2018-10-22 DIAGNOSIS — M170 Bilateral primary osteoarthritis of knee: Secondary | ICD-10-CM

## 2018-10-22 DIAGNOSIS — J449 Chronic obstructive pulmonary disease, unspecified: Secondary | ICD-10-CM | POA: Diagnosis not present

## 2018-10-22 LAB — URINALYSIS, ROUTINE W REFLEX MICROSCOPIC
Bilirubin Urine: NEGATIVE
Hgb urine dipstick: NEGATIVE
Ketones, ur: NEGATIVE
Leukocytes, UA: NEGATIVE
Nitrite: NEGATIVE
PH: 5.5 (ref 5.0–8.0)
RBC / HPF: NONE SEEN (ref 0–?)
Specific Gravity, Urine: 1.02 (ref 1.000–1.030)
Total Protein, Urine: 100 — AB
UROBILINOGEN UA: 0.2 (ref 0.0–1.0)
Urine Glucose: NEGATIVE

## 2018-10-22 LAB — COMPREHENSIVE METABOLIC PANEL
ALBUMIN: 4.1 g/dL (ref 3.5–5.2)
ALT: 19 U/L (ref 0–53)
AST: 24 U/L (ref 0–37)
Alkaline Phosphatase: 87 U/L (ref 39–117)
BUN: 14 mg/dL (ref 6–23)
CO2: 28 mEq/L (ref 19–32)
Calcium: 9.4 mg/dL (ref 8.4–10.5)
Chloride: 105 mEq/L (ref 96–112)
Creatinine, Ser: 1.06 mg/dL (ref 0.40–1.50)
GFR: 71.87 mL/min (ref >60.00)
Glucose, Bld: 143 mg/dL — ABNORMAL HIGH (ref 70–99)
Potassium: 4.8 mEq/L (ref 3.5–5.1)
Sodium: 141 mEq/L (ref 135–145)
TOTAL PROTEIN: 6.6 g/dL (ref 6.0–8.3)
Total Bilirubin: 0.3 mg/dL (ref 0.2–1.2)

## 2018-10-22 LAB — LDL CHOLESTEROL, DIRECT: Direct LDL: 57 mg/dL

## 2018-10-22 LAB — CBC
HCT: 34.4 % — ABNORMAL LOW (ref 39.0–52.0)
Hemoglobin: 11.9 g/dL — ABNORMAL LOW (ref 13.0–17.0)
MCHC: 34.5 g/dL (ref 30.0–36.0)
MCV: 100.5 fl — ABNORMAL HIGH (ref 78.0–100.0)
Platelets: 119 10*3/uL — ABNORMAL LOW (ref 150.0–400.0)
RBC: 3.42 Mil/uL — ABNORMAL LOW (ref 4.22–5.81)
RDW: 14.4 % (ref 11.5–15.5)
WBC: 6.7 10*3/uL (ref 4.0–10.5)

## 2018-10-22 LAB — HEMOGLOBIN A1C: HEMOGLOBIN A1C: 7.1 % — AB (ref 4.6–6.5)

## 2018-10-22 NOTE — Progress Notes (Signed)
Phone: [phone]  Subjective:  Patient presents today for their annual physical. Chief complaint-noted.   See problem oriented charting- ROS- full  review of systems was completed and negative except for: light sensitivity, shortness of breath, leg swelling, joint and back pain, numbness and pain into feet from diabtes  The following were reviewed and entered/updated in epic: Past Medical History:  Diagnosis Date  . Benign prostatic hypertrophy   . COPD (chronic obstructive pulmonary disease) ([place]) December 15, 2009   FEV1 2.30 (70%) ratio 63 no better with B2 and DLCO 18/6 (73%) corrects to 106%  . Coughing December 18, 2009   Sinus CT : Minor mucosal thickening at the Right Frontoethmoidal recess, otherwise  negative sinuses  . Degenerative joint disease   . Depression   . Diabetes mellitus type II   . Hyperlipidemia   . Hypertension August 12, 2009   try off ACE for upper airway cough  . Low back pain   . Otitis externa 07/30/2012   Patient Active Problem List   Diagnosis Date Noted  . 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 CKD (chronic kidney disease), stage III ([place]) 05/06/2015    Priority: High  . ILD (interstitial lung disease) ([place]) 07/05/2012    Priority: High  . COPD (chronic obstructive pulmonary disease) ([place]) 12/15/2009    Priority: High  . Chronic back pain. Off narcotics 06/05/17 due to negative UDS for opiates x2. Full history 06/12/14. Pain contract signed.  05/02/2007    Priority: High  . DM (diabetes mellitus) type II controlled, neurological manifestation ([place]) 04/06/2007    Priority: High  . Thrombocytopenia ([place]) 12/21/2015    Priority: Medium  . B12 deficiency 08/20/2015    Priority: Medium  . Diabetic polyneuropathy associated with type 2 diabetes mellitus ([place]) 08/07/2015    Priority: Medium  . Fatty liver 11/04/2014    Priority: Medium  . BPH (benign prostatic hyperplasia) 06/20/2007    Priority: Medium  . Depression 05/02/2007   Priority: Medium  . Hyperlipidemia 04/06/2007    Priority: Medium  . Essential hypertension 04/06/2007    Priority: Medium  . Osteoarthritis 04/06/2007    Priority: Medium  . GERD (gastroesophageal reflux disease) 09/30/2017    Priority: Low  . 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 positive 03/29/2016    Priority: Low  . 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 Anemia 03/05/2018  . Diplopia 06/05/2015  . PCO (posterior capsular opacification) 06/19/2013  . 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 Pseudophakia of left eye 04/10/2013  . Nuclear cataract 01/20/2012  . 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 dvt  1980    Family History  Problem Relation Age of Onset  . Cancer Mother        melanoma  . Stroke Father     Medications- reviewed and updated Current Outpatient Medications  Medication Sig Dispense Refill  . acyclovir (ZOVIRAX) 400 MG tablet TAKE 1 TABLET TWICE DAILY 180 tablet 2  . albuterol (PROVENTIL HFA;VENTOLIN HFA) 108 (90 Base) MCG/ACT inhaler Inhale 2 puffs into the lungs every 6 (six) hours as needed for wheezing or shortness of breath. 1 Inhaler 5  . amitriptyline (ELAVIL) 50 MG tablet TAKE 1 TABLET AT BEDTIME 90 tablet 3  . amLODipine (NORVASC) 10 MG tablet TAKE 1 TABLET EVERY DAY 90 tablet 3  . ASCORBIC ACID PO 1,000 mg every morning.    . Blood Glucose Monitoring Suppl (  TRUE METRIX AIR GLUCOSE METER) w/Device KIT     . Cholecalciferol 1000 UNITS capsule Take 1 capsule (1,000 Units total) by mouth 2 (two) times daily.    . clotrimazole-betamethasone (LOTRISONE) cream Apply 1 application topically 2 (two) times daily. For 7 days maximum. Stop if worsening symptoms. 45 g 1  . diclofenac sodium (VOLTAREN) 1 % GEL Apply 2 g topically 4 (four) times daily. 100 g 3  . finasteride (PROSCAR) 5 MG tablet TAKE 1 TABLET EVERY DAY 90 tablet 3  . gabapentin (NEURONTIN) 300 MG capsule TAKE 1 CAPSULE TWICE  DAILY 180 capsule 1  . lisinopril (PRINIVIL,ZESTRIL) 2.5 MG tablet Take 1 tablet (2.5 mg total) by mouth daily. 30 tablet 5  . metFORMIN (GLUCOPHAGE) 1000 MG tablet Take 1 tablet (1,000 mg total) by mouth 2 (two) times daily with a meal. 180 tablet 3  . metFORMIN (GLUCOPHAGE) 500 MG tablet TAKE 1 TABLET EVERY DAY WITH BREAKFAST 90 tablet 3  . Multiple Vitamin (MULTIVITAMIN) capsule Take 1 capsule by mouth daily.      . NONFORMULARY OR COMPOUNDED ITEM [facility]  Peripheral Neuropathy Cream- Bupivacaine 1%, Doxepin 3%, Gabapentin 6%, Pentoxifylline 3%, Topiramate 1% Apply 1-2 grams to affected area 3-4 times daily Qty. 120 gm 3 refills    . omeprazole (PRILOSEC) 20 MG capsule TAKE 1 CAPSULE DAILY 30 TO 60 MINUTES BEFORE FIRST MEAL OF THE DAY 90 capsule 3  . rosuvastatin (CRESTOR) 10 MG tablet TAKE 1 TABLET EVERY DAY 90 tablet 3  . tamsulosin (FLOMAX) 0.4 MG CAPS capsule TAKE 1 CAPSULE EVERY DAY 90 capsule 1  . TRUE METRIX BLOOD GLUCOSE TEST test strip     . TRUEPLUS LANCETS 28G MISC Use to test blood sugars two times daily. Dx: E11.9 200 each 3   No current facility-administered medications for this visit.     Allergies-reviewed and updated No Known Allergies  Social History   Social History Narrative   Married 53 years in 2015. 4 kids (2 sons) and 3 grandkids.    Lived in [place], [place] wholel life      Retired from [employer], going to [employer] where they have a camper             Objective: BP 136/82 (BP Location: Left Arm, Patient Position: Sitting, Cuff Size: Large)   Pulse 99   Temp 97.8 F (36.6 C) (Oral)   Ht '6\' 1"'  (1.854 m)   Wt 213 lb 3.2 oz (96.7 kg)   SpO2 96%   BMI 28.13 kg/m  Gen: NAD, resting comfortably HEENT: Mucous membranes are moist. Oropharynx normal Neck: no thyromegaly CV: RRR no murmurs rubs or gallops Lungs: CTAB no crackles, wheeze, rhonchi Abdomen: soft/nontender/nondistended/normal bowel sounds.  Ext: trace to 1+  edema Skin: warm, dry Neuro: grossly normal, moves all extremities, PERRLA Antalgic gait- struggles to get onto table  Assessment/Plan:  78 y.o. male presenting for annual physical.  Health Maintenance counseling: 1. Anticipatory guidance: Patient counseled regarding regular dental exams - wears dentures, eye exams -yearly,  avoiding smoking and second hand smoke , limiting alcohol to 2 beverages per day - doesn't drink at all.   2. Risk factor reduction:  Advised patient of need for regular exercise and diet rich and fruits and vegetables to reduce risk of heart attack and stroke. Exercise- very little- knees hurt him. Going back and forth to shop some at the house- he wants to try flexogenics and see if he can  feel better to get more active. Diet-has been slipping up over holidays- plans to reverse this. Eats too many biscuits and breads even with his diabetes Wt Readings from Last 3 Encounters:  10/22/18 213 lb 3.2 oz (96.7 kg)  06/21/18 208 lb 14.4 oz (94.8 kg)  06/04/18 210 lb (95.3 kg)  3. Immunizations/screenings/ancillary studies-discussed Shingrix at pharmacy - thinks had old zostavax  Immunization History  Administered Date(s) Administered  . Influenza Split 09/07/2011, 07/30/2012  . Influenza Whole 10/10/2005, 08/31/2009, 07/10/2010  . Influenza, High Dose Seasonal PF 06/22/2016, 06/29/2017  . Influenza,inj,Quad PF,6+ Mos 07/01/2013, 06/20/2014  . Influenza-Unspecified 10/26/2015, 09/12/2018  . Meningococcal Conjugate 07/24/2014  . Pneumococcal Conjugate-13 07/24/2014  . Pneumococcal Polysaccharide-23 08/31/2009  . Td 06/10/2006  4. Prostate cancer screening- past age based screenings for prostate cancer 5. Colon cancer screening - 2008 colonoscopy- they recommended office visit- I printed letter 6. Skin cancer screening- sees Dr. Nevada Crane. advised regular sunscreen use. Denies worrisome, changing, or new skin lesions.  7.  Former smoker- we will get urinalysis with labs    Status of chronic or acute concerns   Osteoarthritis knee- he declines visit with our sports medicine team- wants to see flexogenics  At [phone].   Hypertension-controlled on amlodipine 10 mg and lisinopril 2.5 mg on repeat   Diabetes type 2- has been controlled on metformin 1 g in the morning, 5 mg in the evening. Lab Results  Component Value Date   HGBA1C 6.4 (A) 06/04/2018    Patient is on gabapentin for associated diabetic neuropathy as well as amitriptyline 50 mg. Also takes 3 tylenol - we discussed only taking 2 .   Hyperlipidemia-has been controlled on Crestor 10 mg-has had full lipid panel within a year-will defer today  COPD- remains stable from last visit with rare use of albuterol uses perhaps 2 days a week right now.  Continue current meds- may need to add controller  Diagnosed ILD in past- likely contributes to breathing issues. Has been steroid responsive in past. Never biopsied.   Thrombocytopenia- saw hematology in September 2019- they recommended consideration of abdominal ultrasound if worsening thrombocytopenia.  Dr. 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 He is going to see urology  GERD-reasonably controlled on omeprazole 20 mg . pepcid listed but sounsd like not taking  Senile purpura- easy bruising/bleeding while on aspirin- we agreed to stop this aspirin to see if this helps.  Depression- controlled outside of situationally with knee pain on amitriptyline 85m alone. phq9 to be updated by team-    Future Appointments  Date Time Provider [place]enta 12/18/2018  1:45 PM GMarzetta Board DPM [facility]  12/20/2018  2:00 PM [facility] None  12/20/2018  2:40 PM [facility]etta Genera [facility]   Return in about 4 months (around 02/20/2019) for follow up- or sooner if  needed.  Lab/Order associations: Not fasting- had a cookie with meds this AM Preventative health care - Plan: Urinalysis, Hemoglobin A1c, CBC, Comprehensive metabolic panel, LDL cholesterol, direct  Controlled type 2 diabetes mellitus with diabetic polyneuropathy, without long-term current use of insulin ([place] - Plan: Urinalysis, Hemoglobin A1c, CBC, Comprehensive metabolic panel, LDL cholesterol, direct  Primary osteoarthritis of both knees - Plan: Ambulatory referral to Orthopedic Surgery  Chronic obstructive pulmonary disease, unspecified COPD type ([place], Chronic  Thrombocytopenia (  [place]), Chronic  BMI 28.0-28.9,adult  ILD (interstitial lung disease) ([facility])  CKD (chronic kidney disease), stage III ([place])  Return precautions advised.  [name], MD

## 2018-10-22 NOTE — Patient Instructions (Addendum)
We will call you within two weeks about your referral to flexogenics per your request (you declined referral to Dr. Paulla Fore of sports medicine or other orthopedics). If you do not hear within 3 weeks, give [place] a call.   Please check with your pharmacy to see if they have the shingrix vaccine. If they do- please get this immunization and update [place] by phone call or mychart with dates you receive the vaccine  Please stop by lab before you go

## 2018-10-23 ENCOUNTER — Telehealth: Payer: Self-pay | Admitting: Family Medicine

## 2018-10-23 NOTE — Telephone Encounter (Signed)
Call returned to pt. To discuss results; see result note.

## 2018-10-23 NOTE — Telephone Encounter (Signed)
Copied from [place] [id number]. Topic: Quick Communication - Lab Results (Clinic Use ONLY) >> Oct 23, 2018  1:28 PM [place], [place], [place] wrote: Called patient to inform them of 10/22/2018 lab results. When patient returns call, triage nurse may disclose results. >> Oct 23, 2018  3:02 PM Bea Graff, NT wrote: Pt returning call for lab results.

## 2018-10-23 NOTE — Telephone Encounter (Signed)
Pended Metformin refill in error.

## 2018-11-14 ENCOUNTER — Encounter: Payer: Self-pay | Admitting: Internal Medicine

## 2018-11-20 ENCOUNTER — Telehealth: Payer: Self-pay | Admitting: Family Medicine

## 2018-11-20 NOTE — Telephone Encounter (Signed)
See note  Copied from [place] [id number]. Topic: Referral - Status >> Nov 20, 2018  2:46 PM Margot Ables wrote: Reason for CRM: pt called stating he has not heard from [place] and was advised to call the office if he hadn't been scheduled in 3 weeks. Please f/u with pt.

## 2018-11-28 ENCOUNTER — Encounter: Payer: Self-pay | Admitting: *Deleted

## 2018-11-29 ENCOUNTER — Telehealth: Payer: Self-pay | Admitting: Family Medicine

## 2018-11-29 NOTE — Telephone Encounter (Signed)
Copied from [place] [id number]. Topic: Referral - Status >> Nov 29, 2018  4:50 PM Yvette Rack wrote: Reason for CRM: Pt stated he has not heard anything regarding an appt for [facility]. Pt requests call back. [phone]

## 2018-11-30 NOTE — Telephone Encounter (Signed)
Can you please assist with this?

## 2018-12-18 ENCOUNTER — Ambulatory Visit: Payer: Medicare HMO | Admitting: Podiatry

## 2018-12-18 ENCOUNTER — Encounter: Payer: Self-pay | Admitting: Podiatry

## 2018-12-18 ENCOUNTER — Ambulatory Visit (INDEPENDENT_AMBULATORY_CARE_PROVIDER_SITE_OTHER): Payer: Medicare HMO

## 2018-12-18 DIAGNOSIS — E1142 Type 2 diabetes mellitus with diabetic polyneuropathy: Secondary | ICD-10-CM

## 2018-12-18 DIAGNOSIS — M79675 Pain in left toe(s): Secondary | ICD-10-CM

## 2018-12-18 DIAGNOSIS — L97412 Non-pressure chronic ulcer of right heel and midfoot with fat layer exposed: Secondary | ICD-10-CM | POA: Diagnosis not present

## 2018-12-18 DIAGNOSIS — E08621 Diabetes mellitus due to underlying condition with foot ulcer: Secondary | ICD-10-CM

## 2018-12-18 DIAGNOSIS — M79674 Pain in right toe(s): Secondary | ICD-10-CM

## 2018-12-18 DIAGNOSIS — B351 Tinea unguium: Secondary | ICD-10-CM | POA: Diagnosis not present

## 2018-12-18 NOTE — Patient Instructions (Addendum)
DRESSING CHANGES RIGHT FOOT:  WEAR SURGICAL SHOE AT ALL TIMES RIGHT FOOT  1. KEEP RIGHT FOOT DRY AT ALL TIMES!!!! 2. CLEANSE ULCER WITH SALINE 3. DAB DRY WITH GAUZE SPONGE. 4. APPLY A LIGHT AMOUNT OF IODOSORB GEL TO BASE OF ULCER 5. APPLY 2 INCH BANDAID TO WOUND 6. WEAR SURGICAL SHOE DAILY AT ALL TIMES. 7. DO NOT WALK BAREFOOT!!! 8.  IF YOU EXPERIENCE ANY FEVER, CHILLS, NIGHTSWEATS, NAUSEA OR VOMITING, ELEVATED OR LOW BLOOD SUGARS, REPORT TO EMERGENCY ROOM. 9. IF YOU EXPERIENCE INCREASED REDNESS, PAIN, SWELLING, DISCOLORATION, ODOR, PUS, DRAINAGE OR WARMTH, REPORT TO EMERGENCY ROOM.    Diabetes Mellitus and Foot Care Foot care is an important part of your health, especially when you have diabetes. Diabetes may cause you to have problems because of poor blood flow (circulation) to your feet and legs, which can cause your skin to:  Become thinner and drier.  Break more easily.  Heal more slowly.  Peel and crack. You may also have nerve damage (neuropathy) in your legs and feet, causing decreased feeling in them. This means that you may not notice minor injuries to your feet that could lead to more serious problems. Noticing and addressing any potential problems early is the best way to prevent future foot problems. How to care for your feet Foot hygiene  Wash your feet daily with warm water and mild soap. Do not use hot water. Then, pat your feet and the areas between your toes until they are completely dry. Do not soak your feet as this can dry your skin.  Trim your toenails straight across. Do not dig under them or around the cuticle. File the edges of your nails with an emery board or nail file.  Apply a moisturizing lotion or petroleum jelly to the skin on your feet and to dry, brittle toenails. Use lotion that does not contain alcohol and is unscented. Do not apply lotion between your toes. Shoes and socks  Wear clean socks or stockings every day. Make sure they are not too  tight. Do not wear knee-high stockings since they may decrease blood flow to your legs.  Wear shoes that fit properly and have enough cushioning. Always look in your shoes before you put them on to be sure there are no objects inside.  To break in new shoes, wear them for just a few hours a day. This prevents injuries on your feet. Wounds, scrapes, corns, and calluses  Check your feet daily for blisters, cuts, bruises, sores, and redness. If you cannot see the bottom of your feet, use a mirror or ask someone for help.  Do not cut corns or calluses or try to remove them with medicine.  If you find a minor scrape, cut, or break in the skin on your feet, keep it and the skin around it clean and dry. You may clean these areas with mild soap and water. Do not clean the area with peroxide, alcohol, or iodine.  If you have a wound, scrape, corn, or callus on your foot, look at it several times a day to make sure it is healing and not infected. Check for: ? Redness, swelling, or pain. ? Fluid or blood. ? Warmth. ? Pus or a bad smell. General instructions  Do not cross your legs. This may decrease blood flow to your feet.  Do not use heating pads or hot water bottles on your feet. They may burn your skin. If you have lost feeling in your feet or  legs, you may not know this is happening until it is too late.  Protect your feet from hot and cold by wearing shoes, such as at the beach or on hot pavement.  Schedule a complete foot exam at least once a year (annually) or more often if you have foot problems. If you have foot problems, report any cuts, sores, or bruises to your health care provider immediately. Contact a health care provider if:  You have a medical condition that increases your risk of infection and you have any cuts, sores, or bruises on your feet.  You have an injury that is not healing.  You have redness on your legs or feet.  You feel burning or tingling in your legs or  feet.  You have pain or cramps in your legs and feet.  Your legs or feet are numb.  Your feet always feel cold.  You have pain around a toenail. Get help right away if:  You have a wound, scrape, corn, or callus on your foot and: ? You have pain, swelling, or redness that gets worse. ? You have fluid or blood coming from the wound, scrape, corn, or callus. ? Your wound, scrape, corn, or callus feels warm to the touch. ? You have pus or a bad smell coming from the wound, scrape, corn, or callus. ? You have a fever. ? You have a red line going up your leg. Summary  Check your feet every day for cuts, sores, red spots, swelling, and blisters.  Moisturize feet and legs daily.  Wear shoes that fit properly and have enough cushioning.  If you have foot problems, report any cuts, sores, or bruises to your health care provider immediately.  Schedule a complete foot exam at least once a year (annually) or more often if you have foot problems. This information is not intended to replace advice given to you by your health care provider. Make sure you discuss any questions you have with your health care provider. Document Released: 09/23/2000 Document Revised: 11/08/2017 Document Reviewed: 10/28/2016 Elsevier Interactive Patient Education  2019 [employer]vier Inc.  Onychomycosis/Fungal Toenails  WHAT IS IT? An infection that lies within the keratin of your nail plate that is caused by a fungus.  WHY ME? Fungal infections affect all ages, sexes, races, and creeds.  There may be many factors that predispose you to a fungal infection such as age, coexisting medical conditions such as diabetes, or an autoimmune disease; stress, medications, fatigue, genetics, etc.  Bottom line: fungus thrives in a warm, moist environment and your shoes offer such a location.  IS IT CONTAGIOUS? Theoretically, yes.  You do not want to share shoes, nail clippers or files with someone who has fungal toenails.  Walking  around barefoot in the same room or sleeping in the same bed is unlikely to transfer the organism.  It is important to realize, however, that fungus can spread easily from one nail to the next on the same foot.  HOW DO WE TREAT THIS?  There are several ways to treat this condition.  Treatment may depend on many factors such as age, medications, pregnancy, liver and kidney conditions, etc.  It is best to ask your doctor which options are available to you.  1. No treatment.   Unlike many other medical concerns, you can live with this condition.  However for many people this can be a painful condition and may lead to ingrown toenails or a bacterial infection.  It is recommended that  you keep the nails cut short to help reduce the amount of fungal nail. 2. Topical treatment.  These range from herbal remedies to prescription strength nail lacquers.  About 40-50% effective, topicals require twice daily application for approximately 9 to 12 months or until an entirely new nail has grown out.  The most effective topicals are medical grade medications available through physicians offices. 3. Oral antifungal medications.  With an 80-90% cure rate, the most common oral medication requires 3 to 4 months of therapy and stays in your system for a year as the new nail grows out.  Oral antifungal medications do require blood work to make sure it is a safe drug for you.  A liver function panel will be performed prior to starting the medication and after the first month of treatment.  It is important to have the blood work performed to avoid any harmful side effects.  In general, this medication safe but blood work is required. 4. Laser Therapy.  This treatment is performed by applying a specialized laser to the affected nail plate.  This therapy is noninvasive, fast, and non-painful.  It is not covered by insurance and is therefore, out of pocket.  The results have been very good with a 80-95% cure rate.  [facility] the only practice in the area to offer this therapy. 5. Permanent Nail Avulsion.  Removing the entire nail so that a new nail will not grow back.

## 2018-12-19 NOTE — Progress Notes (Signed)
HEMATOLOGY/ONCOLOGY CONSULTATION NOTE  Date of Service: 12/20/2018  Patient Care Team: [name], MD as PCP - General (Family Medicine)  CHIEF COMPLAINTS/PURPOSE OF CONSULTATION:  Thrombocytopenia  HISTORY OF PRESENTING ILLNESS:   [name] is a wonderful 78 y.o. 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 He is accompanied today by his wife. The pt reports that he is doing well overall.   The pt reports that he has had low energy levels from the past 3-4 years since he retired. Today he feels fatigued as well.  He has been taking Metformin for the last 10 years and has recently begun replacing Vitamin B12. He has neuropathy in his feet and has experienced some falls and uses a cane at night. He is also taking Finasteride  Most recent lab results (02/28/18) of CBC  is as follows: all values are WNL except for RBC at 3.42, HGB at 11.4, HCT at 33.5, Platelets at 127k. Testosterone 02/13/18 was lower at 185  On review of systems, pt reports neuropathy in his feet, some fatigue, stable back pain, some ankle swelling, and denies abdominal pains and any other symptoms.   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 The patient's last visit with [place] was on 06/21/18. He is accompanied today by his wife. The pt reports that he is doing well overall.   The pt reports that he has not had any concerns with bleeding. He notes that he has recently had a diabetic ulcer in the interim, and will be having an XR next week to rule out bone involvement. The pt has not had any antibiotics, but notes that he will be seeing a podiatrist soon as well. The pt endorses stable energy levels  and denies new fatigue.  Besides this, the pt notes that he has not had any other concerns. He has continued on a Vitamin B12 and Vitamin B complex. The pt notes that his DM has fluctuated.  The pt notes that he hasn't had any alcohol in 40 years.   Lab results today (12/20/18) of CBC w/diff and CMP is as follows: all values are WNL except for RBC at 3.37, HGB at 11.1, HCT at 34.0, MCV at 100.9, PLT at 109k, Glucose at 213. 12/20/18 Ferritin at 69, 30% Saturation ratio 12/20/18 Vitamin B12 at 1112  On review of systems, pt reports stable weight, right foot ulcer, stable energy levels, and denies fevers, chills, night sweats, concerns for bleeding, and any other symptoms.  MEDICAL HISTORY:  Past Medical History:  Diagnosis Date  . Benign prostatic hypertrophy   . COPD (chronic obstructive pulmonary disease) ([place]) December 15, 2009   FEV1 2.30 (70%) ratio 63 no better with B2 and DLCO 18/6 (73%) corrects to 106%  . Coughing December 18, 2009   Sinus CT : Minor mucosal thickening at the Right Frontoethmoidal recess, otherwise  negative sinuses  . Degenerative joint disease   . Depression   . Diabetes mellitus type II   . Diverticulosis   . Fatty liver   . Hyperlipidemia   . Hypertension August 12, 2009   try off ACE for upper airway cough  . Internal hemorrhoids   .  Low back pain   . Otitis externa 07/30/2012  . Splenomegaly     SURGICAL HISTORY: Past Surgical History:  Procedure Laterality Date  . dvt  1980    SOCIAL HISTORY: Social History   Socioeconomic History  . Marital status: Married    Spouse name: Not on file  . Number of children: Not on file  . Years of education: Not on file  . Highest education level: Not on file  Occupational History  . Occupation: retired    Comment: maintenence work  Scientific laboratory technician  . Financial resource strain: Not on file  . Food insecurity:    Worry: Not on file    Inability: Not on file  . Transportation needs:    Medical: Not on file     Non-medical: Not on file  Tobacco Use  . Smoking status: Former Smoker    Packs/day: 4.00    Years: 20.00    Pack years: 80.00    Types: Cigarettes    Last attempt to quit: 09/18/1984    Years since quitting: 34.2  . Smokeless tobacco: Never Used  . Tobacco comment: quit in 1980  Substance and Sexual Activity  . 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 Drug use: No  . Sexual activity: Yes  Lifestyle  . Physical activity:    Days per week: Not on file    Minutes per session: Not on file  . Stress: Not on file  Relationships  . Social connections:    Talks on phone: Not on file    Gets together: Not on file    Attends religious service: Not on file    Active member of club or organization: Not on file    Attends meetings of clubs or organizations: Not on file    Relationship status: Not on file  . Intimate partner violence:    Fear of current or ex partner: Not on file    Emotionally abused: Not on file    Physically abused: Not on file    Forced sexual activity: Not on file  Other Topics Concern  . Not on file  Social History Narrative   Married 53 years in 2015. 4 kids (2 sons) and 3 grandkids.    Lived in [place], [place] wholel life      Retired from [employer], going to [employer] where they have a camper             FAMILY HISTORY: Family History  Problem Relation Age of Onset  . Cancer Mother        melanoma  . Stroke Father     ALLERGIES:  has No Known Allergies.  MEDICATIONS:  Current Outpatient Medications  Medication Sig Dispense Refill  . acyclovir (ZOVIRAX) 400 MG tablet TAKE 1 TABLET TWICE DAILY 180 tablet 2  . albuterol (PROVENTIL HFA;VENTOLIN HFA) 108 (90 Base) MCG/ACT inhaler Inhale 2 puffs into the lungs every 6 (six) hours as needed for wheezing or shortness of breath. 1 Inhaler 5  . amitriptyline (ELAVIL) 50 MG tablet TAKE 1 TABLET AT BEDTIME 90 tablet 3  . amLODipine (NORVASC) 10 MG tablet TAKE 1  TABLET EVERY DAY 90 tablet 3  . ASCORBIC ACID PO 1,000 mg every morning.    . Blood Glucose Monitoring Suppl (TRUE METRIX AIR GLUCOSE METER) w/Device KIT     . Cholecalciferol 1000 UNITS capsule Take 1 capsule (1,000 Units total) by mouth  2 (two) times daily.    . clotrimazole-betamethasone (LOTRISONE) cream Apply 1 application topically 2 (two) times daily. For 7 days maximum. Stop if worsening symptoms. 45 g 1  . diclofenac sodium (VOLTAREN) 1 % GEL Apply 2 g topically 4 (four) times daily. 100 g 3  . finasteride (PROSCAR) 5 MG tablet TAKE 1 TABLET EVERY DAY 90 tablet 3  . gabapentin (NEURONTIN) 300 MG capsule TAKE 1 CAPSULE TWICE DAILY 180 capsule 1  . lisinopril (PRINIVIL,ZESTRIL) 2.5 MG tablet Take 1 tablet (2.5 mg total) by mouth daily. 30 tablet 5  . metFORMIN (GLUCOPHAGE) 1000 MG tablet Take 1 tablet (1,000 mg total) by mouth 2 (two) times daily with a meal. 180 tablet 3  . metFORMIN (GLUCOPHAGE) 500 MG tablet TAKE 1 TABLET EVERY DAY WITH BREAKFAST 90 tablet 3  . Multiple Vitamin (MULTIVITAMIN) capsule Take 1 capsule by mouth daily.      . NONFORMULARY OR COMPOUNDED ITEM [facility]  Peripheral Neuropathy Cream- Bupivacaine 1%, Doxepin 3%, Gabapentin 6%, Pentoxifylline 3%, Topiramate 1% Apply 1-2 grams to affected area 3-4 times daily Qty. 120 gm 3 refills    . omeprazole (PRILOSEC) 20 MG capsule TAKE 1 CAPSULE DAILY 30 TO 60 MINUTES BEFORE FIRST MEAL OF THE DAY 90 capsule 3  . rosuvastatin (CRESTOR) 10 MG tablet TAKE 1 TABLET EVERY DAY 90 tablet 3  . tamsulosin (FLOMAX) 0.4 MG CAPS capsule TAKE 1 CAPSULE EVERY DAY 90 capsule 1  . TRUE METRIX BLOOD GLUCOSE TEST test strip     . TRUEPLUS LANCETS 28G MISC Use to test blood sugars two times daily. Dx: E11.9 200 each 3   No current facility-administered medications for this visit.     REVIEW OF SYSTEMS:    A 10+ POINT REVIEW OF SYSTEMS WAS OBTAINED including neurology, dermatology, psychiatry, cardiac, respiratory, lymph,  extremities, GI, GU, Musculoskeletal, constitutional, breasts, reproductive, HEENT.  All pertinent positives are noted in the HPI.  All others are negative.    PHYSICAL EXAMINATION:  . Vitals:   12/20/18 1440  BP: (!) 157/77  Pulse: 85  Resp: 18  Temp: 97.6 F (36.4 C)  SpO2: 97%   Filed Weights   12/20/18 1440  Weight: 210 lb 1.6 oz (95.3 kg)   .Body mass index is 27.72 kg/m.  GENERAL:alert, in no acute distress and comfortable SKIN: no acute rashes, no significant lesions EYES: conjunctiva are pink and non-injected, sclera anicteric OROPHARYNX: MMM, no exudates, no oropharyngeal erythema or ulceration NECK: supple, no JVD LYMPH:  no palpable lymphadenopathy in the cervical, axillary or inguinal regions LUNGS: clear to auscultation b/l with normal respiratory effort HEART: regular rate & rhythm ABDOMEN:  normoactive bowel sounds , non tender, not distended. No palpable hepatosplenomegaly.  Extremity: no pedal edema PSYCH: alert & oriented x 3 with fluent speech NEURO: no focal motor/sensory deficits   LABORATORY DATA:  I have reviewed the data as listed  . CBC Latest Ref Rng & Units 12/20/2018 10/22/2018 06/21/2018  WBC 4.0 - 10.5 K/uL 5.1 6.7 6.2  Hemoglobin 13.0 - 17.0 g/dL 11.1(L) 11.9(L) 11.5(L)  Hematocrit 39.0 - 52.0 % 34.0(L) 34.4(L) 33.1(L)  Platelets 150 - 400 K/uL 109(L) 119.0(L) 125(L)   . CBC    Component Value Date/Time   WBC 5.1 12/20/2018 1320   RBC 3.37 (L) 12/20/2018 1320   HGB 11.1 (L) 12/20/2018 1320   HGB 11.6 (L) 02/13/2018 1550   HCT 34.0 (L) 12/20/2018 1320   HCT 33.1 (L) 06/21/2018 1332   PLT 109 (  L) 12/20/2018 1320   PLT 118 (L) 02/13/2018 1550   MCV 100.9 (H) 12/20/2018 1320   MCH 32.9 12/20/2018 1320   MCHC 32.6 12/20/2018 1320   RDW 13.7 12/20/2018 1320   LYMPHSABS 1.6 12/20/2018 1320   MONOABS 0.3 12/20/2018 1320   EOSABS 0.1 12/20/2018 1320   BASOSABS 0.0 12/20/2018 1320    . CMP Latest Ref Rng & Units 12/20/2018 10/22/2018  06/21/2018  Glucose 70 - 99 mg/dL 213(H) 143(H) 182(H)  BUN 8 - 23 mg/dL '11 14 13  ' Creatinine 0.61 - 1.24 mg/dL 1.15 1.06 1.15  Sodium 135 - 145 mmol/L 142 141 140  Potassium 3.5 - 5.1 mmol/L 4.6 4.8 5.0  Chloride 98 - 111 mmol/L 108 105 106  CO2 22 - 32 mmol/L '23 28 24  ' Calcium 8.9 - 10.3 mg/dL 9.0 9.4 9.5  Total Protein 6.5 - 8.1 g/dL 6.6 6.6 6.7  Total Bilirubin 0.3 - 1.2 mg/dL 0.4 0.3 0.4  Alkaline Phos 38 - 126 U/L 92 87 81  AST 15 - 41 U/L '27 24 22  ' ALT 0 - 44 U/L '15 19 16   ' Component     Latest Ref Rng & Units 06/21/2018  Folate, Hemolysate     Not Estab. ng/mL >620.0  HCT     37.5 - 51.0 % 33.1 (L)  Folate, RBC     >498 ng/mL >1,873  Methylmalonic Acid, Quantitative     0 - 378 nmol/L 294  DISCLAIMER      Comment  Specimen Type      [place]  Lead-Whole Blood     0 - 4 ug/dL 2  Vitamin B12     180 - 914 pg/mL 836  Homocysteine     0.0 - 15.0 umol/L 11.2    02/28/18 Surgical Pathology:   02/28/18 Cytogenetics:    RADIOGRAPHIC STUDIES: I have personally reviewed the radiological images as listed and agreed with the findings in the report. Dg Foot Complete Right  Result Date: 12/18/2018 Please see detailed radiograph report in office note.   ASSESSMENT & PLAN:   78 y.o. male with  1. 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 02/28/18 surgical pathology report with no overt pathology, could not rule out MDS, and noted an increase in ring sideroblasts 02/28/18 BM cytogenetics report was unremarkable  2. 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 Vitamin B12 at 1112. -Discussed that as the patient's counts have been quite stable overall, I recommend continued observation again in one year -Recommend staying active and pursuing an ideal body weight -Continue using a Vitamin B  complex (increased ring sideroblasts ?pyridoxin deficiency) and continue 1047mg Vitamin B12 replacement -Wouldn't necessarily recommend replacing testosterone given BPH history -Pt reports not consuming alcohol in 40 years -no overt evidence of symptomatic liver disease -- did has diffuse hepatic steatosis no splenomegaly on [place] 2016 - if worsening thrombocytopenia might need rpt [place] for evaluation of liver and spleen. -Will see the pt back in one year, sooner if any new concerns   RTC with Dr KIrene Limbowith labs in 12 months   All of the patients questions were answered with apparent satisfaction. The patient knows to call the clinic with any problems, questions or concerns.  The total time spent in the appt was 25 minutes and more  than 50% was on counseling and direct patient cares.   Orders Placed This Encounter  Procedures  . CBC with Differential/Platelet    Standing Status:   Future    Standing Expiration Date:   01/24/2020  . CMP ([place] only)    Standing Status:   Future    Standing Expiration Date:   12/20/2019  . Vitamin B12    Standing Status:   Future    Standing Expiration Date:   12/20/2019  . Ferritin    Standing Status:   Future    Standing Expiration Date:   12/20/2019     (Office):       [phone] (Work cell):  [phone] (Fax):           [phone]  12/20/2018 3:46 PM  I, Baldwin Jamaica, am acting as a scribe for Dr. Sullivan Lone.   .I have reviewed the above documentation for accuracy and completeness, and I agree with the above. Brunetta Genera MD

## 2018-12-20 ENCOUNTER — Other Ambulatory Visit: Payer: Self-pay

## 2018-12-20 ENCOUNTER — Inpatient Hospital Stay: Payer: Medicare HMO | Attending: Hematology | Admitting: Hematology

## 2018-12-20 ENCOUNTER — Telehealth: Payer: Self-pay | Admitting: Hematology

## 2018-12-20 ENCOUNTER — Inpatient Hospital Stay: Payer: Medicare HMO

## 2018-12-20 VITALS — BP 157/77 | HR 85 | Temp 97.6°F | Resp 18 | Ht 73.0 in | Wt 210.1 lb

## 2018-12-20 DIAGNOSIS — D696 Thrombocytopenia, unspecified: Secondary | ICD-10-CM

## 2018-12-20 DIAGNOSIS — Z7984 Long term (current) use of oral hypoglycemic drugs: Secondary | ICD-10-CM | POA: Diagnosis not present

## 2018-12-20 DIAGNOSIS — I10 Essential (primary) hypertension: Secondary | ICD-10-CM

## 2018-12-20 DIAGNOSIS — D649 Anemia, unspecified: Secondary | ICD-10-CM

## 2018-12-20 DIAGNOSIS — Z87891 Personal history of nicotine dependence: Secondary | ICD-10-CM

## 2018-12-20 DIAGNOSIS — E119 Type 2 diabetes mellitus without complications: Secondary | ICD-10-CM

## 2018-12-20 DIAGNOSIS — Z79899 Other long term (current) drug therapy: Secondary | ICD-10-CM | POA: Diagnosis not present

## 2018-12-20 DIAGNOSIS — E538 Deficiency of other specified B group vitamins: Secondary | ICD-10-CM

## 2018-12-20 LAB — FERRITIN: Ferritin: 69 ng/mL (ref 24–336)

## 2018-12-20 LAB — IRON AND TIBC
Iron: 84 ug/dL (ref 42–163)
Saturation Ratios: 30 % (ref 20–55)
TIBC: 281 ug/dL (ref 202–409)
UIBC: 198 ug/dL (ref 117–376)

## 2018-12-20 LAB — CBC WITH DIFFERENTIAL/PLATELET
Abs Immature Granulocytes: 0.01 10*3/uL (ref 0.00–0.07)
BASOS ABS: 0 10*3/uL (ref 0.0–0.1)
Basophils Relative: 1 %
Eosinophils Absolute: 0.1 10*3/uL (ref 0.0–0.5)
Eosinophils Relative: 2 %
HCT: 34 % — ABNORMAL LOW (ref 39.0–52.0)
Hemoglobin: 11.1 g/dL — ABNORMAL LOW (ref 13.0–17.0)
Immature Granulocytes: 0 %
Lymphocytes Relative: 32 %
Lymphs Abs: 1.6 10*3/uL (ref 0.7–4.0)
MCH: 32.9 pg (ref 26.0–34.0)
MCHC: 32.6 g/dL (ref 30.0–36.0)
MCV: 100.9 fL — ABNORMAL HIGH (ref 80.0–100.0)
Monocytes Absolute: 0.3 10*3/uL (ref 0.1–1.0)
Monocytes Relative: 6 %
NEUTROS ABS: 3.1 10*3/uL (ref 1.7–7.7)
NRBC: 0 % (ref 0.0–0.2)
Neutrophils Relative %: 59 %
PLATELETS: 109 10*3/uL — AB (ref 150–400)
RBC: 3.37 MIL/uL — ABNORMAL LOW (ref 4.22–5.81)
RDW: 13.7 % (ref 11.5–15.5)
WBC: 5.1 10*3/uL (ref 4.0–10.5)

## 2018-12-20 LAB — CMP (CANCER CENTER ONLY)
ALT: 15 U/L (ref 0–44)
AST: 27 U/L (ref 15–41)
Albumin: 3.6 g/dL (ref 3.5–5.0)
Alkaline Phosphatase: 92 U/L (ref 38–126)
Anion gap: 11 (ref 5–15)
BUN: 11 mg/dL (ref 8–23)
CHLORIDE: 108 mmol/L (ref 98–111)
CO2: 23 mmol/L (ref 22–32)
Calcium: 9 mg/dL (ref 8.9–10.3)
Creatinine: 1.15 mg/dL (ref 0.61–1.24)
GFR, Est AFR Am: >60 mL/min (ref >60)
GFR, Estimated: >60 mL/min (ref >60)
Glucose, Bld: 213 mg/dL — ABNORMAL HIGH (ref 70–99)
POTASSIUM: 4.6 mmol/L (ref 3.5–5.1)
Sodium: 142 mmol/L (ref 135–145)
Total Bilirubin: 0.4 mg/dL (ref 0.3–1.2)
Total Protein: 6.6 g/dL (ref 6.5–8.1)

## 2018-12-20 LAB — VITAMIN B12: Vitamin B-12: 1112 pg/mL — ABNORMAL HIGH (ref 180–914)

## 2018-12-20 NOTE — Telephone Encounter (Signed)
Scheduled appt per 3/12 los. ° °Printed calendar and avs. ° °

## 2018-12-21 ENCOUNTER — Encounter: Payer: Self-pay | Admitting: Internal Medicine

## 2018-12-21 ENCOUNTER — Ambulatory Visit: Payer: Medicare HMO | Admitting: Internal Medicine

## 2018-12-21 VITALS — BP 116/68 | HR 102 | Temp 98.4°F | Ht 69.0 in | Wt 210.5 lb

## 2018-12-21 DIAGNOSIS — Z1211 Encounter for screening for malignant neoplasm of colon: Secondary | ICD-10-CM

## 2018-12-21 DIAGNOSIS — K760 Fatty (change of) liver, not elsewhere classified: Secondary | ICD-10-CM | POA: Diagnosis not present

## 2018-12-21 DIAGNOSIS — R161 Splenomegaly, not elsewhere classified: Secondary | ICD-10-CM | POA: Diagnosis not present

## 2018-12-21 DIAGNOSIS — K219 Gastro-esophageal reflux disease without esophagitis: Secondary | ICD-10-CM | POA: Diagnosis not present

## 2018-12-21 DIAGNOSIS — D696 Thrombocytopenia, unspecified: Secondary | ICD-10-CM | POA: Diagnosis not present

## 2018-12-21 NOTE — Progress Notes (Signed)
Patient ID: [name], male   DOB: [birth year], 78 y.o.   MRN: [id number] HPI: [name] is a 78 year old male with a past medical history of hypertension, hyperlipidemia, fatty liver, mild thrombocytopenia with splenomegaly, diabetes, low back pain who is seen to discuss screening colonoscopy.  He is here alone today.  He reports that he would like to have another colonoscopy as his last one was more than 10 years ago.  On review of records he had a colonoscopy on 05/18/2007 by Dr. Verl Blalock.  This showed colonic diverticulosis and internal hemorrhoids but was otherwise normal.  He reports that he has been feeling well from a GI perspective.  He denies change in bowel habit, diarrhea or constipation.  No abdominal pain.  No blood in his stool or melena.  He does have a history of reflux and takes omeprazole 20 mg a day.  He reports this is well controlled without dysphagia or odynophagia.  Denies nausea and vomiting.  Denies unintentional weight loss.  He did see Dr. Irene Limbo yesterday to evaluate thrombocytopenia.  He does have mild splenomegaly.  Abdominal ultrasound reviewed which does not show overt nodularity or cirrhosis.  He has a history of fatty liver.  His albumin is normal.  His ferritin is normal.  His B12 is normal.  Last platelet count is 109.  No family history of liver disease.  No abdominal ascites.  Total bilirubin is 0.4  Past Medical History:  Diagnosis Date  . Benign prostatic hypertrophy   . COPD (chronic obstructive pulmonary disease) ([place]) December 15, 2009   FEV1 2.30 (70%) ratio 63 no better with B2 and DLCO 18/6 (73%) corrects to 106%  . Coughing December 18, 2009   Sinus CT : Minor mucosal thickening at the Right Frontoethmoidal recess, otherwise  negative sinuses  . Degenerative joint disease   . Depression   . Diabetes mellitus type II   . Diverticulosis   . Fatty liver   . Hyperlipidemia   . Hypertension August 12, 2009   try off ACE for upper airway cough  .  Internal hemorrhoids   . Low back pain   . Otitis externa 07/30/2012  . Splenomegaly   . Ulcer of foot ([place])    right foot    Past Surgical History:  Procedure Laterality Date  . dvt  1980    Outpatient Medications Prior to Visit  Medication Sig Dispense Refill  . acyclovir (ZOVIRAX) 400 MG tablet TAKE 1 TABLET TWICE DAILY 180 tablet 2  . albuterol (PROVENTIL HFA;VENTOLIN HFA) 108 (90 Base) MCG/ACT inhaler Inhale 2 puffs into the lungs every 6 (six) hours as needed for wheezing or shortness of breath. 1 Inhaler 5  . amitriptyline (ELAVIL) 50 MG tablet TAKE 1 TABLET AT BEDTIME 90 tablet 3  . amLODipine (NORVASC) 10 MG tablet TAKE 1 TABLET EVERY DAY 90 tablet 3  . ASCORBIC ACID PO 1,000 mg every morning.    . Blood Glucose Monitoring Suppl (TRUE METRIX AIR GLUCOSE METER) w/Device KIT     . Cholecalciferol 1000 UNITS capsule Take 1 capsule (1,000 Units total) by mouth 2 (two) times daily.    . clotrimazole-betamethasone (LOTRISONE) cream Apply 1 application topically 2 (two) times daily. For 7 days maximum. Stop if worsening symptoms. 45 g 1  . diclofenac sodium (VOLTAREN) 1 % GEL Apply 2 g topically 4 (four) times daily. 100 g 3  . finasteride (PROSCAR) 5 MG tablet TAKE 1 TABLET EVERY DAY 90 tablet 3  .  gabapentin (NEURONTIN) 300 MG capsule TAKE 1 CAPSULE TWICE DAILY 180 capsule 1  . lisinopril (PRINIVIL,ZESTRIL) 2.5 MG tablet Take 1 tablet (2.5 mg total) by mouth daily. 30 tablet 5  . metFORMIN (GLUCOPHAGE) 1000 MG tablet Take 1 tablet (1,000 mg total) by mouth 2 (two) times daily with a meal. (Patient taking differently: Take 1,000 mg by mouth daily with breakfast. ) 180 tablet 3  . metFORMIN (GLUCOPHAGE) 500 MG tablet TAKE 1 TABLET EVERY DAY WITH BREAKFAST (Patient taking differently: Take 500 mg by mouth every evening. ) 90 tablet 3  . Multiple Vitamin (MULTIVITAMIN) capsule Take 1 capsule by mouth daily.      . NONFORMULARY OR COMPOUNDED ITEM [facility]  Peripheral Neuropathy  Cream- Bupivacaine 1%, Doxepin 3%, Gabapentin 6%, Pentoxifylline 3%, Topiramate 1% Apply 1-2 grams to affected area 3-4 times daily Qty. 120 gm 3 refills    . omeprazole (PRILOSEC) 20 MG capsule TAKE 1 CAPSULE DAILY 30 TO 60 MINUTES BEFORE FIRST MEAL OF THE DAY 90 capsule 3  . rosuvastatin (CRESTOR) 10 MG tablet TAKE 1 TABLET EVERY DAY 90 tablet 3  . tamsulosin (FLOMAX) 0.4 MG CAPS capsule TAKE 1 CAPSULE EVERY DAY 90 capsule 1  . TRUE METRIX BLOOD GLUCOSE TEST test strip     . TRUEPLUS LANCETS 28G MISC Use to test blood sugars two times daily. Dx: E11.9 200 each 3   No facility-administered medications prior to visit.     No Known Allergies  Family History  Problem Relation Age of Onset  . Cancer Mother        melanoma  . Stroke Father     Social History   Tobacco Use  . Smoking status: Former Smoker    Packs/day: 4.00    Years: 20.00    Pack years: 80.00    Types: Cigarettes    Last attempt to quit: 09/18/1984    Years since quitting: 34.2  . Smokeless tobacco: Never Used  . Tobacco comment: quit in 1980  Substance Use Topics  . 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 Drug use: No    ROS: As per history of present illness, otherwise negative  BP 116/68   Pulse (!) 102   Temp 98.4 F (36.9 C)   Ht _0  (1.753 m)   Wt 210 lb 8 oz (95.5 kg)   BMI 31.09 kg/m  Constitutional: Well-developed and well-nourished. No distress. HEENT: Normocephalic and atraumatic. Oropharynx is clear and moist. Conjunctivae are normal.  No scleral icterus. Neck: Neck supple. Trachea midline. Cardiovascular: Normal rate, regular rhythm and intact distal pulses. No M/R/G Pulmonary/chest: Effort normal and breath sounds normal. No wheezing, rales or rhonchi. Abdominal: Soft, nontender, nondistended. Bowel sounds active throughout. There are no masses palpable. No hepatosplenomegaly. Extremities: no clubbing, cyanosis, or edema Neurological: Alert and oriented to  person place and time. Skin: Skin is warm and dry.  Psychiatric: Normal mood and affect. Behavior is normal.  RELEVANT LABS AND IMAGING: CBC    Component Value Date/Time   WBC 5.1 12/20/2018 1320   RBC 3.37 (L) 12/20/2018 1320   HGB 11.1 (L) 12/20/2018 1320   HGB 11.6 (L) 02/13/2018 1550   HCT 34.0 (L) 12/20/2018 1320   HCT 33.1 (L) 06/21/2018 1332   PLT 109 (L) 12/20/2018 1320   PLT 118 (L) 02/13/2018 1550   MCV 100.9 (H) 12/20/2018 1320   MCH 32.9 12/20/2018 1320   MCHC 32.6 12/20/2018 1320  RDW 13.7 12/20/2018 1320   LYMPHSABS 1.6 12/20/2018 1320   MONOABS 0.3 12/20/2018 1320   EOSABS 0.1 12/20/2018 1320   BASOSABS 0.0 12/20/2018 1320    CMP     Component Value Date/Time   NA 142 12/20/2018 1320   K 4.6 12/20/2018 1320   CL 108 12/20/2018 1320   CO2 23 12/20/2018 1320   GLUCOSE 213 (H) 12/20/2018 1320   BUN 11 12/20/2018 1320   CREATININE 1.15 12/20/2018 1320   CALCIUM 9.0 12/20/2018 1320   PROT 6.6 12/20/2018 1320   ALBUMIN 3.6 12/20/2018 1320   AST 27 12/20/2018 1320   ALT 15 12/20/2018 1320   ALKPHOS 92 12/20/2018 1320   BILITOT 0.4 12/20/2018 1320   GFRNONAA >60 12/20/2018 1320   GFRAA >60 12/20/2018 1320    ASSESSMENT/PLAN: 78 year old male with a past medical history of hypertension, hyperlipidemia, fatty liver, mild thrombocytopenia with splenomegaly, diabetes, low back pain who is seen to discuss screening colonoscopy.  1.  Colon cancer screening --average risk with last exam 12 years ago.  We discussed the risk, benefits and alternatives to colonoscopy and he wishes to proceed.  After this discussion I do think it is safe to proceed with screening colonoscopy.  He does not use home oxygen and is not on anticoagulation.  2.  GERD --no alarm symptom.  Controlled on omeprazole 20 mg daily.  3.  Thrombocytopenia and splenomegaly/fatty liver --this combination raises the question of cirrhosis.  There is no evidence of decompensated liver disease, ascites,  jaundice.  INR when checked less than a year ago was 0.96 which is normal.  His albumin is normal.  Liver enzymes are all normal.  Ultrasound unremarkable.  At this time, I do not think further work-up for cirrhosis is indicated.  Will monitor going forward   TW:[facility], [address], [place], [place] [id number]

## 2018-12-21 NOTE — Patient Instructions (Addendum)
You have been scheduled for a colonoscopy. Please follow written instructions given to you at your visit today.  Please pick up your prep supplies at the pharmacy within the next 1-3 days. If you use inhalers (even only as needed), please bring them with you on the day of your procedure. Your physician has requested that you go to www.[email or web address].[email or web address] and enter the access code given to you at your visit today. This web site gives a general overview about your procedure. However, you should still follow specific instructions given to you by our office regarding your preparation for the procedure.  PLEASE HOLD ORAL DIABETIC MEDICATIONS THE MORNING OF YOUR COLONOSCOPY PROCEDURE.  If you are age 46 or older, your body mass index should be between 23-30. Your Body mass index is 31.09 kg/m. If this is out of the aforementioned range listed, please consider follow up with your Primary Care Provider.  If you are age 87 or younger, your body mass index should be between 19-25. Your Body mass index is 31.09 kg/m. If this is out of the aformentioned range listed, please consider follow up with your Primary Care Provider.

## 2018-12-25 ENCOUNTER — Encounter: Payer: Self-pay | Admitting: Podiatry

## 2018-12-25 ENCOUNTER — Other Ambulatory Visit: Payer: Self-pay

## 2018-12-25 ENCOUNTER — Ambulatory Visit (INDEPENDENT_AMBULATORY_CARE_PROVIDER_SITE_OTHER): Payer: Medicare HMO | Admitting: Podiatry

## 2018-12-25 DIAGNOSIS — L97412 Non-pressure chronic ulcer of right heel and midfoot with fat layer exposed: Secondary | ICD-10-CM

## 2018-12-25 DIAGNOSIS — E08621 Diabetes mellitus due to underlying condition with foot ulcer: Secondary | ICD-10-CM | POA: Diagnosis not present

## 2018-12-25 NOTE — Progress Notes (Signed)
Mr.  BRANDAN ROBICHEAUX presents for continued care of ulceration submetatarsal head 1 right foot.  Patient has been performing daily dressing changes to right foot daily utilizing Iodosorb Gel. Pt. denies any new complaints.  Patient denies any fever, chills, nightsweats, nausea or vomiting.  Mr. Broker states he has followed my instructions very well and relates he feels less pain to area.    Current Outpatient Medications:  .  acyclovir (ZOVIRAX) 400 MG tablet, TAKE 1 TABLET TWICE DAILY, Disp: 180 tablet, Rfl: 2 .  albuterol (PROVENTIL HFA;VENTOLIN HFA) 108 (90 Base) MCG/ACT inhaler, Inhale 2 puffs into the lungs every 6 (six) hours as needed for wheezing or shortness of breath., Disp: 1 Inhaler, Rfl: 5 .  amitriptyline (ELAVIL) 50 MG tablet, TAKE 1 TABLET AT BEDTIME, Disp: 90 tablet, Rfl: 3 .  amLODipine (NORVASC) 10 MG tablet, TAKE 1 TABLET EVERY DAY, Disp: 90 tablet, Rfl: 3 .  ASCORBIC ACID PO, 1,000 mg every morning., Disp: , Rfl:  .  Blood Glucose Monitoring Suppl (TRUE METRIX AIR GLUCOSE METER) w/Device KIT, , Disp: , Rfl:  .  Cholecalciferol 1000 UNITS capsule, Take 1 capsule (1,000 Units total) by mouth 2 (two) times daily., Disp: , Rfl:  .  clotrimazole-betamethasone (LOTRISONE) cream, Apply 1 application topically 2 (two) times daily. For 7 days maximum. Stop if worsening symptoms., Disp: 45 g, Rfl: 1 .  diclofenac sodium (VOLTAREN) 1 % GEL, Apply 2 g topically 4 (four) times daily., Disp: 100 g, Rfl: 3 .  finasteride (PROSCAR) 5 MG tablet, TAKE 1 TABLET EVERY DAY, Disp: 90 tablet, Rfl: 3 .  gabapentin (NEURONTIN) 300 MG capsule, TAKE 1 CAPSULE TWICE DAILY, Disp: 180 capsule, Rfl: 1 .  lisinopril (PRINIVIL,ZESTRIL) 2.5 MG tablet, Take 1 tablet (2.5 mg total) by mouth daily., Disp: 30 tablet, Rfl: 5 .  metFORMIN (GLUCOPHAGE) 1000 MG tablet, Take 1 tablet (1,000 mg total) by mouth 2 (two) times daily with a meal. (Patient taking differently: Take 1,000 mg by mouth daily with breakfast. ),  Disp: 180 tablet, Rfl: 3 .  metFORMIN (GLUCOPHAGE) 500 MG tablet, TAKE 1 TABLET EVERY DAY WITH BREAKFAST (Patient taking differently: Take 500 mg by mouth every evening. ), Disp: 90 tablet, Rfl: 3 .  Multiple Vitamin (MULTIVITAMIN) capsule, Take 1 capsule by mouth daily.  , Disp: , Rfl:  .  NONFORMULARY OR COMPOUNDED ITEM, [facility]  Peripheral Neuropathy Cream- Bupivacaine 1%, Doxepin 3%, Gabapentin 6%, Pentoxifylline 3%, Topiramate 1% Apply 1-2 grams to affected area 3-4 times daily Qty. 120 gm 3 refills, Disp: , Rfl:  .  omeprazole (PRILOSEC) 20 MG capsule, TAKE 1 CAPSULE DAILY 30 TO 60 MINUTES BEFORE FIRST MEAL OF THE DAY, Disp: 90 capsule, Rfl: 3 .  rosuvastatin (CRESTOR) 10 MG tablet, TAKE 1 TABLET EVERY DAY, Disp: 90 tablet, Rfl: 3 .  tamsulosin (FLOMAX) 0.4 MG CAPS capsule, TAKE 1 CAPSULE EVERY DAY, Disp: 90 capsule, Rfl: 1 .  TRUE METRIX BLOOD GLUCOSE TEST test strip, , Disp: , Rfl:  .  TRUEPLUS LANCETS 28G MISC, Use to test blood sugars two times daily. Dx: E11.9, Disp: 200 each, Rfl: 3   No Known Allergies   Objective:   Ulceration located submetatarsal head 1 right foot.  Predebridement measurements carried out today of 0.3 x 0.2 x 0.1 cm. Area is nearly healed and has inverted teardrop shape.  No redness, streaking or lymphangitis noted.  No probing to bone, no undermining, no active pus or purulence noted.  No deep abscess, no  erythema, no edema, no cellulitis, no odor was encountered.    Postdebridement measurements today are: 0.3 x 0.3 x 0.1 cm.   No redness, streaking or lymphangitis noted.  No probing to bone, no undermining, no active pus or purulence noted.  No deep abscess, no erythema, no edema, no cellulitis, no odor was encountered.    Assessment:   1.  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    NIDDM with peripheral neuropathy  Plan: 1. Ulcer was debrided and reactive hyperkeratoses and necrotic tissue was resected to the level of bleeding or  viable tissue. Ulcer was cleansed with wound cleanser. Iodosorb Gel was applied to base of wound with light dressing. 2. Xray not  performed right foot. 3. Continue surgical shoe right foot at all times. 4. Patient to continue daily iodosorb dressings. Continue to keep right foot dry. 5. Patient is to follow up 2 weeks. 6. Patient instructed to report to emergency department with worsening appearance of ulcer/toe/foot, increased pain, foul odor, increased redness, swelling, drainage, fever, chills, nightsweats, nausea, vomiting, increased blood sugar.  7. Patient related understanding.

## 2018-12-27 ENCOUNTER — Encounter: Payer: Self-pay | Admitting: Podiatry

## 2018-12-27 NOTE — Progress Notes (Addendum)
Subjective: Patient presents today for preventative diabetic foot care.  He has a diabetic neuropathy.  He is seen for painful, mycotic toenails bilaterally and callus on the bottom of his right foot.  Pain is aggravated when wearing enclosed shoe gear. Pain is getting progressively worse and relieved with periodic professional debridement.  He does relate tenderness to the plantar aspect of his right foot on today.  He has noticed blood on his sock for the past 2 weeks.  [name], MD is his PCP.   Current Outpatient Medications:  .  acyclovir (ZOVIRAX) 400 MG tablet, TAKE 1 TABLET TWICE DAILY, Disp: 180 tablet, Rfl: 2 .  albuterol (PROVENTIL HFA;VENTOLIN HFA) 108 (90 Base) MCG/ACT inhaler, Inhale 2 puffs into the lungs every 6 (six) hours as needed for wheezing or shortness of breath., Disp: 1 Inhaler, Rfl: 5 .  amitriptyline (ELAVIL) 50 MG tablet, TAKE 1 TABLET AT BEDTIME, Disp: 90 tablet, Rfl: 3 .  amLODipine (NORVASC) 10 MG tablet, TAKE 1 TABLET EVERY DAY, Disp: 90 tablet, Rfl: 3 .  ASCORBIC ACID PO, 1,000 mg every morning., Disp: , Rfl:  .  Blood Glucose Monitoring Suppl (TRUE METRIX AIR GLUCOSE METER) w/Device KIT, , Disp: , Rfl:  .  Cholecalciferol 1000 UNITS capsule, Take 1 capsule (1,000 Units total) by mouth 2 (two) times daily., Disp: , Rfl:  .  clotrimazole-betamethasone (LOTRISONE) cream, Apply 1 application topically 2 (two) times daily. For 7 days maximum. Stop if worsening symptoms., Disp: 45 g, Rfl: 1 .  diclofenac sodium (VOLTAREN) 1 % GEL, Apply 2 g topically 4 (four) times daily., Disp: 100 g, Rfl: 3 .  finasteride (PROSCAR) 5 MG tablet, TAKE 1 TABLET EVERY DAY, Disp: 90 tablet, Rfl: 3 .  gabapentin (NEURONTIN) 300 MG capsule, TAKE 1 CAPSULE TWICE DAILY, Disp: 180 capsule, Rfl: 1 .  lisinopril (PRINIVIL,ZESTRIL) 2.5 MG tablet, Take 1 tablet (2.5 mg total) by mouth daily., Disp: 30 tablet, Rfl: 5 .  metFORMIN (GLUCOPHAGE) 1000 MG tablet, Take 1 tablet (1,000 mg total)  by mouth 2 (two) times daily with a meal. (Patient taking differently: Take 1,000 mg by mouth daily with breakfast. ), Disp: 180 tablet, Rfl: 3 .  metFORMIN (GLUCOPHAGE) 500 MG tablet, TAKE 1 TABLET EVERY DAY WITH BREAKFAST (Patient taking differently: Take 500 mg by mouth every evening. ), Disp: 90 tablet, Rfl: 3 .  Multiple Vitamin (MULTIVITAMIN) capsule, Take 1 capsule by mouth daily.  , Disp: , Rfl:  .  NONFORMULARY OR COMPOUNDED ITEM, [facility]  Peripheral Neuropathy Cream- Bupivacaine 1%, Doxepin 3%, Gabapentin 6%, Pentoxifylline 3%, Topiramate 1% Apply 1-2 grams to affected area 3-4 times daily Qty. 120 gm 3 refills, Disp: , Rfl:  .  omeprazole (PRILOSEC) 20 MG capsule, TAKE 1 CAPSULE DAILY 30 TO 60 MINUTES BEFORE FIRST MEAL OF THE DAY, Disp: 90 capsule, Rfl: 3 .  rosuvastatin (CRESTOR) 10 MG tablet, TAKE 1 TABLET EVERY DAY, Disp: 90 tablet, Rfl: 3 .  tamsulosin (FLOMAX) 0.4 MG CAPS capsule, TAKE 1 CAPSULE EVERY DAY, Disp: 90 capsule, Rfl: 1 .  TRUE METRIX BLOOD GLUCOSE TEST test strip, , Disp: , Rfl:  .  TRUEPLUS LANCETS 28G MISC, Use to test blood sugars two times daily. Dx: E11.9, Disp: 200 each, Rfl: 3   No Known Allergies   Objective:  Vascular Examination: Capillary refill time less than 3 seconds x 10 digits.  Dorsalis pedis pulses palpable bilaterally.  Posterior tibial pulses palpable bilaterally.  Digital hair absent x 10 digits.  Skin temperature gradient within normal limits bilaterally.    Dermatological Examination: Skin thin, shiny and atrophic b/l  Toenails 1-5 b/l discolored, thick, dystrophic with subungual debris and pain with palpation to nailbeds due to thickness of nails.  Hyperkeratotic lesion noted submetatarsal head 1 of the right foot.  There is no erythema, no edema, no drainage.  There is flocculence noted suspecting an underlying ulceration.  Predebridement measurements carried out today of 2.0 x 1.5 cm.  No redness, streaking or lymphangitis  noted.  No probing to bone, no undermining, no active pus or purulence noted.  No deep abscess, no erythema, no edema, no cellulitis, no odor was encountered.    Postdebridement measurements today are: 1.5 x 0.5 x 0.1 cm with pink granular base.  No redness, streaking or lymphangitis noted.  No probing to bone, no undermining, no active pus or purulence noted.  No deep abscess, no erythema, no edema, no cellulitis, no odor was encountered.    Musculoskeletal: Muscle strength 5/5 to all LE muscle groups  Neurological: Sensation intact with 10 gram monofilament.  Vibratory sensation intact.  X-ray right foot reveals no bone destruction noted first metatarsal.  Assessment: 1. Painful onychomycosis toenails 1-5 b/l 2.   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   NIDDM with peripheral neuropathy  Plan: 1. Toenails 1-5 b/l were debrided in length and girth without iatrogenic bleeding. 2. Ulcer was debrided and reactive hyperkeratoses and necrotic tissue was resected to the level of bleeding or viable tissue. Ulcer was cleansed with wound cleanser.  Iodosorb gel was applied to base of wound with light dressing. 3. Xray of the right foot was performed and reviewed with the patient. 4. Surgical shoe was dispensed for right foot. 5. Patient was given instructions on offloading and dressing change/aftercare and was instructed to call immediately if any signs or symptoms of infection arise.  6. Patient instructed to report to emergency department with worsening appearance of ulcer/toe/foot, increased pain, foul odor, increased redness, swelling, drainage, fever, chills, nightsweats, nausea, vomiting, increased blood sugar.  7. Patient/POA related understanding. 8. Follow up 1 week 9. Patient/POA to call should there be a concern in the interim.

## 2018-12-28 ENCOUNTER — Telehealth: Payer: Self-pay | Admitting: *Deleted

## 2018-12-28 NOTE — Telephone Encounter (Signed)
I have left a voicemail for patient to call back.  Patient needs to be advised that unfortunately, [facility] has requested that all non-emergent or non life threatening cases be rescheduled at this time due to COVID-19 pandemic.   Patient is currently scheduled for colonoscopy on 12/31/2018 but will be cancelled and rescheduled at a later date once appointment becomes available.

## 2018-12-31 ENCOUNTER — Encounter: Payer: Medicare HMO | Admitting: Internal Medicine

## 2018-12-31 NOTE — Telephone Encounter (Signed)
[name], Clerical Supervisor has spoken to patient and he verbalizes understanding of information below. We will call him back to reschedule at a later time.

## 2019-01-08 ENCOUNTER — Telehealth: Payer: Self-pay | Admitting: *Deleted

## 2019-01-08 NOTE — Telephone Encounter (Signed)
Pt called states he was returning a call from Amy.

## 2019-01-08 NOTE — Telephone Encounter (Signed)
I called pt, asked if he had Amy's last name or the concern of the call. Pt states he knew the concern of the call was to make an appt he stepped on a cement step and opened his ulcer. I offered pt an appt and he said he already got a call and is scheduled for tomorrow.

## 2019-01-09 ENCOUNTER — Ambulatory Visit (INDEPENDENT_AMBULATORY_CARE_PROVIDER_SITE_OTHER): Payer: Medicare HMO

## 2019-01-09 ENCOUNTER — Ambulatory Visit: Payer: Medicare HMO | Admitting: Podiatry

## 2019-01-09 ENCOUNTER — Other Ambulatory Visit: Payer: Self-pay

## 2019-01-09 VITALS — Temp 98.5°F

## 2019-01-09 DIAGNOSIS — E08621 Diabetes mellitus due to underlying condition with foot ulcer: Secondary | ICD-10-CM | POA: Diagnosis not present

## 2019-01-09 DIAGNOSIS — S93601A Unspecified sprain of right foot, initial encounter: Secondary | ICD-10-CM | POA: Diagnosis not present

## 2019-01-09 DIAGNOSIS — L97412 Non-pressure chronic ulcer of right heel and midfoot with fat layer exposed: Secondary | ICD-10-CM | POA: Diagnosis not present

## 2019-01-09 DIAGNOSIS — S9031XA Contusion of right foot, initial encounter: Secondary | ICD-10-CM | POA: Diagnosis not present

## 2019-01-09 DIAGNOSIS — E1149 Type 2 diabetes mellitus with other diabetic neurological complication: Secondary | ICD-10-CM

## 2019-01-09 MED ORDER — MUPIROCIN 2 % EX OINT: TOPICAL_OINTMENT | CUTANEOUS | 0 refills | Status: AC

## 2019-01-09 NOTE — Progress Notes (Signed)
[name] presents for follow up diabetic ulceration submetatarsal head 1 right foot.  Patient has been performing daily dressing changes to right foot daily utilizing Iodosorb Gel. Patient denies any fever, chills, nightsweats, nausea or vomiting.  [name] states he has continued to follow dressing change instructions.  He relates he injured his right foot going into his workshed. He hit his right foot on a concrete step. He relates pain on the plantar forefoot. No swelling, no warmth. He did not break the skin.   Current Outpatient Medications:  .  acyclovir (ZOVIRAX) 400 MG tablet, TAKE 1 TABLET TWICE DAILY, Disp: 180 tablet, Rfl: 2 .  albuterol (PROVENTIL HFA;VENTOLIN HFA) 108 (90 Base) MCG/ACT inhaler, Inhale 2 puffs into the lungs every 6 (six) hours as needed for wheezing or shortness of breath., Disp: 1 Inhaler, Rfl: 5 .  amitriptyline (ELAVIL) 50 MG tablet, TAKE 1 TABLET AT BEDTIME, Disp: 90 tablet, Rfl: 3 .  amLODipine (NORVASC) 10 MG tablet, TAKE 1 TABLET EVERY DAY, Disp: 90 tablet, Rfl: 3 .  ASCORBIC ACID PO, 1,000 mg every morning., Disp: , Rfl:  .  Blood Glucose Monitoring Suppl (TRUE METRIX AIR GLUCOSE METER) w/Device KIT, , Disp: , Rfl:  .  Cholecalciferol 1000 UNITS capsule, Take 1 capsule (1,000 Units total) by mouth 2 (two) times daily., Disp: , Rfl:  .  clotrimazole-betamethasone (LOTRISONE) cream, Apply 1 application topically 2 (two) times daily. For 7 days maximum. Stop if worsening symptoms., Disp: 45 g, Rfl: 1 .  diclofenac sodium (VOLTAREN) 1 % GEL, Apply 2 g topically 4 (four) times daily., Disp: 100 g, Rfl: 3 .  finasteride (PROSCAR) 5 MG tablet, TAKE 1 TABLET EVERY DAY, Disp: 90 tablet, Rfl: 3 .  gabapentin (NEURONTIN) 300 MG capsule, TAKE 1 CAPSULE TWICE DAILY, Disp: 180 capsule, Rfl: 1 .  lisinopril (PRINIVIL,ZESTRIL) 2.5 MG tablet, Take 1 tablet (2.5 mg total) by mouth daily., Disp: 30 tablet, Rfl: 5 .  metFORMIN (GLUCOPHAGE) 1000 MG tablet, Take 1  tablet (1,000 mg total) by mouth 2 (two) times daily with a meal. (Patient taking differently: Take 1,000 mg by mouth daily with breakfast. ), Disp: 180 tablet, Rfl: 3 .  metFORMIN (GLUCOPHAGE) 500 MG tablet, TAKE 1 TABLET EVERY DAY WITH BREAKFAST (Patient taking differently: Take 500 mg by mouth every evening. ), Disp: 90 tablet, Rfl: 3 .  Multiple Vitamin (MULTIVITAMIN) capsule, Take 1 capsule by mouth daily.  , Disp: , Rfl:  .  NONFORMULARY OR COMPOUNDED ITEM, [facility]  Peripheral Neuropathy Cream- Bupivacaine 1%, Doxepin 3%, Gabapentin 6%, Pentoxifylline 3%, Topiramate 1% Apply 1-2 grams to affected area 3-4 times daily Qty. 120 gm 3 refills, Disp: , Rfl:  .  omeprazole (PRILOSEC) 20 MG capsule, TAKE 1 CAPSULE DAILY 30 TO 60 MINUTES BEFORE FIRST MEAL OF THE DAY, Disp: 90 capsule, Rfl: 3 .  rosuvastatin (CRESTOR) 10 MG tablet, TAKE 1 TABLET EVERY DAY, Disp: 90 tablet, Rfl: 3 .  tamsulosin (FLOMAX) 0.4 MG CAPS capsule, TAKE 1 CAPSULE EVERY DAY, Disp: 90 capsule, Rfl: 1 .  TRUE METRIX BLOOD GLUCOSE TEST test strip, , Disp: , Rfl:  .  TRUEPLUS LANCETS 28G MISC, Use to test blood sugars two times daily. Dx: E11.9, Disp: 200 each, Rfl: 3   No Known Allergies   Objective:    Vascular status intact b/l  Diminished sensation with 10 gram monofilament b/l.  Gross examination of right foot reveals no breaks in skin other than current ulceration. No edema,  no erythema, no drainage, no flocculence. No increased warmth.   Ulceration located submetatarsal head 1 right foot.  Predebridement measurements carried out today 0.3 x 0.3 x 0.1 cm with hyperkeratotic roof.   area is nearly healed and has inverted teardrop shape.  No probing to bone, no undermining, no active pus or purulence noted.  No deep abscess, no erythema, no edema, no cellulitis, no odor was encountered.     Postdebridement picture:   Postdebridement measurements today are: 0.5 x 0.3 x 0.1 cm. Red, granular base.   No redness,  streaking or lymphangitis noted.  No probing to bone, no undermining, no active pus or purulence noted.  No deep abscess, no erythema, no edema, no cellulitis, no odor was encountered.    Xrays right foot reveal no cortical disruption indicating fracture. No gas in tissue. No soft tissue swelling. +Posterior calcaneal spur.  Assessment:   1. 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 Contusion right foot 3.    NIDDM with peripheral neuropathy  Plan: 1. Ulcer was debrided and reactive hyperkeratoses and necrotic tissue was resected to the level of bleeding or viable tissue. Ulcer was cleansed with wound cleanser. Iodosorb Gel was applied to base of wound with light dressing. 2. Xray performed right foot and reviewed with patient. 3. Continue surgical shoe right foot at all times. 4. Patient to discontinue iodosorb dressings. Start Mupirocin Ointment dressings once daily. Continue to keep right foot dry. 5. Patient is to follow up 2 weeks. 6. Patient instructed to report to emergency department with worsening appearance of ulcer/toe/foot, increased pain, foul odor, increased redness, swelling, drainage, fever, chills, nightsweats, nausea, vomiting, increased blood sugar.  7. Patient related understanding.

## 2019-01-11 ENCOUNTER — Encounter: Payer: Self-pay | Admitting: Podiatry

## 2019-01-14 ENCOUNTER — Other Ambulatory Visit: Payer: Self-pay | Admitting: Family Medicine

## 2019-01-14 DIAGNOSIS — I159 Secondary hypertension, unspecified: Secondary | ICD-10-CM

## 2019-01-17 ENCOUNTER — Other Ambulatory Visit: Payer: Self-pay

## 2019-01-17 MED ORDER — METFORMIN HCL 1000 MG PO TABS: 1000.0000 mg | ORAL_TABLET | Freq: Every day | ORAL | 3 refills | Status: DC

## 2019-01-17 NOTE — Telephone Encounter (Signed)
Metformin 1000 mg #180/3 Last fill 09/30/17 Last ov 10/22/18

## 2019-01-23 ENCOUNTER — Encounter: Payer: Self-pay | Admitting: Podiatry

## 2019-01-23 ENCOUNTER — Other Ambulatory Visit: Payer: Self-pay

## 2019-01-23 ENCOUNTER — Ambulatory Visit: Payer: Medicare HMO | Admitting: Podiatry

## 2019-01-23 DIAGNOSIS — E08621 Diabetes mellitus due to underlying condition with foot ulcer: Secondary | ICD-10-CM | POA: Diagnosis not present

## 2019-01-23 DIAGNOSIS — E1149 Type 2 diabetes mellitus with other diabetic neurological complication: Secondary | ICD-10-CM

## 2019-01-23 DIAGNOSIS — L97412 Non-pressure chronic ulcer of right heel and midfoot with fat layer exposed: Secondary | ICD-10-CM

## 2019-01-24 ENCOUNTER — Encounter: Payer: Self-pay | Admitting: Podiatry

## 2019-01-24 NOTE — Progress Notes (Signed)
Subjective:   [name] presents today for continued care of ulceration of right foot.  Patient has been performing daily dressing changes to right foot daily utilizing Mupirocin ointment.   He states he has continued to follow all instructions. He notes even more improvement in appearance of wound. Pt. denies any new complaints.  Patient denies any fever, chills, nightsweats, nausea or vomiting.   Current Outpatient Medications:  .  acyclovir (ZOVIRAX) 400 MG tablet, TAKE 1 TABLET TWICE DAILY, Disp: 180 tablet, Rfl: 2 .  albuterol (PROVENTIL HFA;VENTOLIN HFA) 108 (90 Base) MCG/ACT inhaler, Inhale 2 puffs into the lungs every 6 (six) hours as needed for wheezing or shortness of breath., Disp: 1 Inhaler, Rfl: 5 .  amitriptyline (ELAVIL) 50 MG tablet, TAKE 1 TABLET AT BEDTIME, Disp: 90 tablet, Rfl: 3 .  amLODipine (NORVASC) 10 MG tablet, TAKE 1 TABLET EVERY DAY, Disp: 90 tablet, Rfl: 3 .  ASCORBIC ACID PO, 1,000 mg every morning., Disp: , Rfl:  .  Blood Glucose Monitoring Suppl (TRUE METRIX AIR GLUCOSE METER) w/Device KIT, , Disp: , Rfl:  .  Cholecalciferol 1000 UNITS capsule, Take 1 capsule (1,000 Units total) by mouth 2 (two) times daily., Disp: , Rfl:  .  clotrimazole-betamethasone (LOTRISONE) cream, Apply 1 application topically 2 (two) times daily. For 7 days maximum. Stop if worsening symptoms., Disp: 45 g, Rfl: 1 .  diclofenac sodium (VOLTAREN) 1 % GEL, Apply 2 g topically 4 (four) times daily., Disp: 100 g, Rfl: 3 .  finasteride (PROSCAR) 5 MG tablet, TAKE 1 TABLET EVERY DAY, Disp: 90 tablet, Rfl: 3 .  gabapentin (NEURONTIN) 300 MG capsule, TAKE 1 CAPSULE TWICE DAILY, Disp: 180 capsule, Rfl: 1 .  lisinopril (PRINIVIL,ZESTRIL) 2.5 MG tablet, Take 1 tablet (2.5 mg total) by mouth daily., Disp: 30 tablet, Rfl: 5 .  metFORMIN (GLUCOPHAGE) 1000 MG tablet, Take 1 tablet (1,000 mg total) by mouth daily with breakfast., Disp: 180 tablet, Rfl: 3 .  metFORMIN (GLUCOPHAGE) 500 MG tablet, TAKE  1 TABLET EVERY DAY WITH BREAKFAST (Patient taking differently: Take 500 mg by mouth every evening. ), Disp: 90 tablet, Rfl: 3 .  Multiple Vitamin (MULTIVITAMIN) capsule, Take 1 capsule by mouth daily.  , Disp: , Rfl:  .  NONFORMULARY OR COMPOUNDED ITEM, [facility]  Peripheral Neuropathy Cream- Bupivacaine 1%, Doxepin 3%, Gabapentin 6%, Pentoxifylline 3%, Topiramate 1% Apply 1-2 grams to affected area 3-4 times daily Qty. 120 gm 3 refills, Disp: , Rfl:  .  omeprazole (PRILOSEC) 20 MG capsule, TAKE 1 CAPSULE DAILY 30 TO 60 MINUTES BEFORE FIRST MEAL OF THE DAY, Disp: 90 capsule, Rfl: 3 .  rosuvastatin (CRESTOR) 10 MG tablet, TAKE 1 TABLET EVERY DAY, Disp: 90 tablet, Rfl: 3 .  tamsulosin (FLOMAX) 0.4 MG CAPS capsule, TAKE 1 CAPSULE EVERY DAY, Disp: 90 capsule, Rfl: 1 .  TRUE METRIX BLOOD GLUCOSE TEST test strip, , Disp: , Rfl:  .  TRUEPLUS LANCETS 28G MISC, Use to test blood sugars two times daily. Dx: E11.9, Disp: 200 each, Rfl: 3   No Known Allergies   Objective:   Ulceration located submet head 1 right foot.  Lesion is callused and measures 1.0 x 0.6 cm with hyperkeratotic roof.  No erythema, no edema, no warmth nor flocculence noted.     Postdebridement, ulcer remains closed with subdermal hemorrhage. Measurements today are 1.0 x 0.6 cm.   No erythema, no edema, no warmth nor flocculence noted.     Assessment:   1.  Diabetic  Ulceration submet head 1 right foot, healed 2.    NIDDM with peripheral neuropathy  Plan: 1. Ulcer right foot was debrided and reactive hyperkeratoses was debrided. Ulcer was cleansed with wound cleanser. 2. Triple antibiotic ointment and bandaid was applied to wound . 3. Xray not indicated on today's visit.   4. He may shower and keep area clean after showering. Apply new bandaid after shower.  5. He was instructed to wear surgical shoe at home and may resume his diabetic shoe when traveling outside the home. 6. Patient is to follow up one month and wear his  diabetic shoes. 7. Patient instructed to report to emergency department with worsening appearance of ulcer/toe/foot, increased pain, foul odor, increased redness, swelling, drainage, fever, chills, nightsweats, nausea, vomiting, increased blood sugar.  8. Patient/POA related understanding.

## 2019-01-28 ENCOUNTER — Telehealth: Payer: Self-pay

## 2019-01-28 NOTE — Telephone Encounter (Signed)
Spoke with [facility] who confirmed that Metformin rx was filled on 4/9 and shipped out.  We received duplicate request on 1/10 which will be deleted.

## 2019-02-11 ENCOUNTER — Telehealth: Payer: Self-pay | Admitting: *Deleted

## 2019-02-11 ENCOUNTER — Encounter: Payer: Self-pay | Admitting: *Deleted

## 2019-02-11 NOTE — Telephone Encounter (Signed)
Called pt, no answer. Left message for him to call us back to reschedule his screening colonoscopy at this time.

## 2019-02-20 ENCOUNTER — Other Ambulatory Visit: Payer: Self-pay

## 2019-02-20 ENCOUNTER — Ambulatory Visit: Payer: Medicare HMO | Admitting: Podiatry

## 2019-02-20 VITALS — Temp 97.9°F

## 2019-02-20 DIAGNOSIS — L84 Corns and callosities: Secondary | ICD-10-CM

## 2019-02-20 DIAGNOSIS — E1142 Type 2 diabetes mellitus with diabetic polyneuropathy: Secondary | ICD-10-CM

## 2019-02-20 DIAGNOSIS — B351 Tinea unguium: Secondary | ICD-10-CM

## 2019-02-21 ENCOUNTER — Ambulatory Visit (INDEPENDENT_AMBULATORY_CARE_PROVIDER_SITE_OTHER): Payer: Medicare HMO | Admitting: Family Medicine

## 2019-02-21 ENCOUNTER — Encounter: Payer: Self-pay | Admitting: Family Medicine

## 2019-02-21 VITALS — Wt 202.0 lb

## 2019-02-21 DIAGNOSIS — E1142 Type 2 diabetes mellitus with diabetic polyneuropathy: Secondary | ICD-10-CM

## 2019-02-21 DIAGNOSIS — M5442 Lumbago with sciatica, left side: Secondary | ICD-10-CM | POA: Diagnosis not present

## 2019-02-21 DIAGNOSIS — N183 Chronic kidney disease, stage 3 unspecified: Secondary | ICD-10-CM

## 2019-02-21 DIAGNOSIS — D696 Thrombocytopenia, unspecified: Secondary | ICD-10-CM

## 2019-02-21 DIAGNOSIS — G8929 Other chronic pain: Secondary | ICD-10-CM | POA: Diagnosis not present

## 2019-02-21 DIAGNOSIS — J449 Chronic obstructive pulmonary disease, unspecified: Secondary | ICD-10-CM

## 2019-02-21 DIAGNOSIS — J849 Interstitial pulmonary disease, unspecified: Secondary | ICD-10-CM

## 2019-02-21 NOTE — Patient Instructions (Signed)
Health Maintenance Due  Topic Date Due  . OPHTHALMOLOGY EXAM - he states he is now due for this- he is waiting on a phone call 01/25/2019

## 2019-02-21 NOTE — Progress Notes (Signed)
Phone [phone]   Subjective:  Virtual visit via phonenote  This visit type was conducted due to national recommendations for restrictions regarding the COVID-19 Pandemic (e.g. social distancing).  This format is felt to be most appropriate for this patient at this time balancing risks to patient and risks to population by having him in for in person visit.  All issues noted in this document were discussed and addressed.  No physical exam was performed (except for noted visual exam or audio findings with Telehealth visits).  The patient has consented to conduct a Telehealth visit and understands insurance will be billed.   Our team/I connected with [name] at  1:20 PM EDT by phone (patient did not have equipment for webex) and verified that I am speaking with the correct person using two identifiers.  Location patient: [place] Location provider: [facility], office Persons participating in the virtual visit:  patient  Time on phone: 16 minutes Counseling provided about  covid 79- he is trying to deal with being at home most of the time, chronic health conditions as noted above  Our team/I discussed the limitations of evaluation and management by telemedicine and the availability of in person appointments. In light of current covid-19 pandemic, patient also understands that we are trying to protect them by minimizing in office contact if at all possible.  The patient expressed consent for telemedicine visit and agreed to proceed. Patient understands insurance will be billed.   ROS- No fever, chills, cough, shortness of breath, new body aches, sore throat, or loss of taste or smell   Past Medical History-  Patient Active Problem List   Diagnosis Date Noted  . Thrombocytopenia ([place]) 12/21/2015    Priority: High  . 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 CKD (chronic kidney disease), stage III ([place]) 05/06/2015    Priority: High  . ILD (interstitial lung disease) ([place])  07/05/2012    Priority: High  . COPD (chronic obstructive pulmonary disease) ([place]) 12/15/2009    Priority: High  . Chronic back pain. Off narcotics 06/05/17 due to negative UDS for opiates x2. Full history 06/12/14. Pain contract signed.  05/02/2007    Priority: High  . DM (diabetes mellitus) type II controlled, neurological manifestation ([place]) 04/06/2007    Priority: High  . B12 deficiency 08/20/2015    Priority: Medium  . Diabetic polyneuropathy associated with type 2 diabetes mellitus ([place]) 08/07/2015    Priority: Medium  . Fatty liver 11/04/2014    Priority: Medium  . BPH (benign prostatic hyperplasia) 06/20/2007    Priority: Medium  . Depression 05/02/2007    Priority: Medium  . Hyperlipidemia associated with type 2 diabetes mellitus ([place]) 04/06/2007    Priority: Medium  . Essential hypertension 04/06/2007    Priority: Medium  . Osteoarthritis 04/06/2007    Priority: Medium  . GERD (gastroesophageal reflux disease) 09/30/2017    Priority: Low  . 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 positive 03/29/2016    Priority: Low  . 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 Anemia 03/05/2018  . Diplopia 06/05/2015  . PCO (posterior capsular opacification) 06/19/2013  . 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 Pseudophakia of left eye 04/10/2013  . Nuclear cataract 01/20/2012  . 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 acyclovir (ZOVIRAX) 400 MG tablet TAKE 1 TABLET TWICE DAILY 180 tablet 2  . albuterol (PROVENTIL HFA;VENTOLIN HFA) 108 (  90 Base) MCG/ACT inhaler Inhale 2 puffs into the lungs every 6 (six) hours as needed for wheezing or shortness of breath. 1 Inhaler 5  . amitriptyline (ELAVIL) 50 MG tablet TAKE 1 TABLET AT BEDTIME 90 tablet 3  . amLODipine (NORVASC) 10 MG tablet TAKE 1 TABLET EVERY DAY 90 tablet 3  . ASCORBIC ACID PO 1,000 mg every morning.    . Blood Glucose  Monitoring Suppl (TRUE METRIX AIR GLUCOSE METER) w/Device KIT     . Cholecalciferol 1000 UNITS capsule Take 1 capsule (1,000 Units total) by mouth 2 (two) times daily.    . clotrimazole-betamethasone (LOTRISONE) cream Apply 1 application topically 2 (two) times daily. For 7 days maximum. Stop if worsening symptoms. 45 g 1  . famotidine (PEPCID) 20 MG tablet     . finasteride (PROSCAR) 5 MG tablet TAKE 1 TABLET EVERY DAY 90 tablet 3  . gabapentin (NEURONTIN) 300 MG capsule TAKE 1 CAPSULE TWICE DAILY 180 capsule 1  . lisinopril (PRINIVIL,ZESTRIL) 2.5 MG tablet Take 1 tablet (2.5 mg total) by mouth daily. 30 tablet 5  . metFORMIN (GLUCOPHAGE) 1000 MG tablet Take 1 tablet (1,000 mg total) by mouth daily with breakfast. 180 tablet 3  . metFORMIN (GLUCOPHAGE) 500 MG tablet TAKE 1 TABLET EVERY DAY WITH BREAKFAST (Patient taking differently: Take 500 mg by mouth every evening. ) 90 tablet 3  . Multiple Vitamin (MULTIVITAMIN) capsule Take 1 capsule by mouth daily.      . NONFORMULARY OR COMPOUNDED ITEM [facility]  Peripheral Neuropathy Cream- Bupivacaine 1%, Doxepin 3%, Gabapentin 6%, Pentoxifylline 3%, Topiramate 1% Apply 1-2 grams to affected area 3-4 times daily Qty. 120 gm 3 refills    . omeprazole (PRILOSEC) 20 MG capsule TAKE 1 CAPSULE DAILY 30 TO 60 MINUTES BEFORE FIRST MEAL OF THE DAY 90 capsule 3  . rosuvastatin (CRESTOR) 10 MG tablet TAKE 1 TABLET EVERY DAY 90 tablet 3  . tamsulosin (FLOMAX) 0.4 MG CAPS capsule TAKE 1 CAPSULE EVERY DAY 90 capsule 1  . TRUE METRIX BLOOD GLUCOSE TEST test strip     . TRUEPLUS LANCETS 28G MISC Use to test blood sugars two times daily. Dx: E11.9 200 each 3   No current facility-administered medications for this visit.      Objective:  Wt 202 lb (91.6 kg)   BMI 29.83 kg/m  self reported vitals  Nonlabored voice, normal speech      Assessment and Plan   #ILD/COPD S: Patient carries a diagnosis of COPD due to former smoking.  Also previously  diagnosed as having interstitial lung disease by pulmonology.  He does not follow-up with them anymore.  Fortunately breathing issues have not progressed.  He has albuterol but uses it sparingly- stable at present A/P: Stable x 2. Continue current medications.    # Diabetes S: compliant with metformin 1056m in AM and 5079min the evening CBG-180 highest sugar has been. Usually around 140-150 but spot checks various times of day Lab Results  Component Value Date   HGBA1C 6.6 (A) 02/22/2019   A/P: Excellent control-continue current medications  #hypertension/CKD stage III S: compliant with amlodipine 1023mlisinopril 2.5mg29mhronic kidney disease has been stable with GFR in the 50s-several readings in 2019 and 2020 with GFR at 60 or above   A/P: CKD stable on labs in early March-we opted to defer phlebotomy for now  Hypertension-reports home blood pressure 141/86 this morning-last blood pressure was well controlled in the office-we will continue to monitor and make  adjustments if needed if remains elevated.  On amlodipine 10 mg  #hyperlipidemia S: compliant with rosuvastatin 81m. LDL goal under 70 A/P:  Stable. Continue current medications.    # GERD S:compliant with omeprazole 26m A/P:  Stable. Continue current medications.     # BPH S:compliant with flomax 0.2m56mnd finasteride 5mg29mRare nocturia .  A/P:  Stable. Continue current medications.     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 Has been on this since 1980- apparently has had flare ups in past when tried to come off. Was in his right eye.  A/P:  Stable. Continue current medications.     #Chronic low back pain/diabetic neuropathy S: Used to be on narcotics but UDS's were negative for narcotics x2 and these were discontinued.  Patient is compliant with amitriptyline and gabapentin-he finds these helpful. A/P:  Stable. Continue current medications.      #Thrombocytopenia S: Follows with  oncology- see extensive note December 20, 2018   -They continue to follow blood work A/P: Stable-continue oncology follow-up  Future Appointments  Date Time Provider DeparStoddard0/2020  1:00 PM Pyrtle, Jay MLajuan LinesLBGI-LEC [facility]  04/24/2019  3:15 PM Gal[place]zetta Board [facility]  12/19/2019  2:30 PM [facility] 5 [facility] None  12/19/2019  3:00 PM Kale,Brunetta [facility]   Lab/Order associations: Controlled type 2 diabetes mellitus with diabetic polyneuropathy, without long-term current use of insulin (HCC) [place]: POCT glycosylated hemoglobin (Hb A1C)  Chronic obstructive pulmonary disease, unspecified COPD type (HCC) [place] (interstitial lung disease) (HCC) [place] (chronic kidney disease), stage III (HCC) [place] bilateral low back pain with left-sided sciatica  Thrombocytopenia (HCC) [place] precautions advised.  StephGarret Reddish

## 2019-02-22 LAB — POCT GLYCOSYLATED HEMOGLOBIN (HGB A1C): Hemoglobin A1C: 6.6 % — AB (ref 4.0–5.6)

## 2019-02-25 ENCOUNTER — Telehealth: Payer: Self-pay | Admitting: Family Medicine

## 2019-02-25 ENCOUNTER — Telehealth: Payer: Self-pay

## 2019-02-25 NOTE — Telephone Encounter (Signed)
Copied from [place] [phone]. Topic: General - Other >> Feb 22, 2019  4:53 PM Nils Flack, Marland Kitchen wrote: Reason for CRM: pt is returning call about labs, no crm.  Please call back

## 2019-02-25 NOTE — Telephone Encounter (Signed)
Called pt and left VM to call the office. See lab note.

## 2019-02-25 NOTE — Telephone Encounter (Signed)
Covid-19 travel screening questions  Have you traveled in the last 14 days? no If yes where?  Do you now or have you had a fever in the last 14 days?no  Do you have any respiratory symptoms of shortness of breath or cough now or in the last 14 days?no, has had COPD for 10 years  Do you have a medical history of Congestive Heart Failure?  Do you have a medical history of lung disease?  Do you have any family members or close contacts with diagnosed or suspected Covid-19? No  Pt advised about having care partner wait in a vehicle and supply cell phone # to contact.  He states that they don't have a cell phone and asked if his wife could wait In the waiting area if she wore a mask.  Stated that that would be ok for this situation.  He complied and stated that they both would require masks when they arrived.

## 2019-02-25 NOTE — Telephone Encounter (Signed)
Patient called back and I relayed the lab message to him. He had no further questions and appreciated the call. No further action required.

## 2019-02-27 ENCOUNTER — Other Ambulatory Visit: Payer: Self-pay

## 2019-02-27 ENCOUNTER — Ambulatory Visit (AMBULATORY_SURGERY_CENTER): Payer: Medicare HMO | Admitting: Internal Medicine

## 2019-02-27 ENCOUNTER — Encounter: Payer: Self-pay | Admitting: Internal Medicine

## 2019-02-27 VITALS — BP 149/67 | HR 92 | Temp 97.4°F | Resp 19

## 2019-02-27 DIAGNOSIS — D124 Benign neoplasm of descending colon: Secondary | ICD-10-CM

## 2019-02-27 DIAGNOSIS — D125 Benign neoplasm of sigmoid colon: Secondary | ICD-10-CM | POA: Diagnosis not present

## 2019-02-27 DIAGNOSIS — D123 Benign neoplasm of transverse colon: Secondary | ICD-10-CM

## 2019-02-27 DIAGNOSIS — Z1211 Encounter for screening for malignant neoplasm of colon: Secondary | ICD-10-CM | POA: Diagnosis not present

## 2019-02-27 DIAGNOSIS — K635 Polyp of colon: Secondary | ICD-10-CM

## 2019-02-27 DIAGNOSIS — D122 Benign neoplasm of ascending colon: Secondary | ICD-10-CM | POA: Diagnosis not present

## 2019-02-27 MED ORDER — SODIUM CHLORIDE 0.9 % IV SOLN: 500.0000 mL | Freq: Once | INTRAVENOUS | Status: DC

## 2019-02-27 NOTE — Progress Notes (Signed)
Called to room to assist during endoscopic procedure.  Patient ID and intended procedure confirmed with present staff. Received instructions for my participation in the procedure from the performing physician.  

## 2019-02-27 NOTE — Progress Notes (Signed)
Pt's states no medical or surgical changes since previsit or office visit. [name],[name]-vital signs. 

## 2019-02-27 NOTE — Progress Notes (Signed)
To PACU, VSS. Report to RN.tb 

## 2019-02-27 NOTE — Op Note (Signed)
[place] Patient Name: [name] Procedure Date: 02/27/2019 12:27 PM MRN: [id number] Endoscopist: Jerene Bears , MD Age: 78 Referring MD:  Date of Birth: [birth year] Gender: Male Account #: [id number] Procedure:                Colonoscopy Indications:              Screening for colorectal malignant neoplasm, Last                            colonoscopy: 2008 Medicines:                Monitored Anesthesia Care Procedure:                Pre-Anesthesia Assessment:                           - Prior to the procedure, a History and Physical                            was performed, and patient medications and                            allergies were reviewed. The patient's tolerance of                            previous anesthesia was also reviewed. The risks                            and benefits of the procedure and the sedation                            options and risks were discussed with the patient.                            All questions were answered, and informed consent                            was obtained. Prior Anticoagulants: The patient has                            taken no previous anticoagulant or antiplatelet                            agents. ASA Grade Assessment: III - A patient with                            severe systemic disease. After reviewing the risks                            and benefits, the patient was deemed in                            satisfactory condition to undergo the procedure.  After obtaining informed consent, the colonoscope                            was passed under direct vision. Throughout the                            procedure, the patient's blood pressure, pulse, and                            oxygen saturations were monitored continuously. The                            Colonoscope was introduced through the anus and                            advanced to the cecum, identified by  appendiceal                            orifice and ileocecal valve. The colonoscopy was                            performed without difficulty. The patient tolerated                            the procedure well. The quality of the bowel                            preparation was good, though larger pieces of solid                            stool was found in the left colon associated with                            diverticulosis and could not be cleared. The                            ileocecal valve, appendiceal orifice, and rectum                            were photographed. Scope In: 1:17:41 PM Scope Out: 1:56:03 PM Scope Withdrawal Time: 0 hours 32 minutes 27 seconds  Total Procedure Duration: 0 hours 38 minutes 22 seconds  Findings:                 Five sessile polyps were found in the ascending                            colon. The polyps were 4 to 15 mm in size. These                            polyps were removed with a cold snare. Resection                            and retrieval  were complete.                           Six sessile polyps were found in the transverse                            colon. The polyps were 3 to 9 mm in size. These                            polyps were removed with a cold snare. Resection                            and retrieval were complete.                           Six sessile polyps were found in the descending                            colon. The polyps were 4 to 10 mm in size. These                            polyps were removed with a cold snare. Resection                            and retrieval were complete.                           A 5 mm polyp was found in the sigmoid colon. The                            polyp was sessile. The polyp was removed with a                            cold snare. Resection and retrieval were complete.                           Multiple small and large-mouthed diverticula were                             found in the sigmoid colon, descending colon and                            ascending colon.                           External and internal hemorrhoids were found during                            retroflexion and during digital exam. The                            hemorrhoids were medium-sized. Complications:            No immediate complications. Estimated Blood Loss:  Estimated blood loss was minimal. Impression:               - Five 4 to 15 mm polyps in the ascending colon,                            removed with a cold snare. Resected and retrieved.                           - Six 3 to 9 mm polyps in the transverse colon,                            removed with a cold snare. Resected and retrieved.                           - Six 4 to 10 mm polyps in the descending colon,                            removed with a cold snare. Resected and retrieved.                           - One 5 mm polyp in the sigmoid colon, removed with                            a cold snare. Resected and retrieved.                           - Severe diverticulosis in the sigmoid colon, in                            the descending colon and in the ascending colon.                           - External and internal hemorrhoids. Recommendation:           - Patient has a contact number available for                            emergencies. The signs and symptoms of potential                            delayed complications were discussed with the                            patient. Return to normal activities tomorrow.                            Written discharge instructions were provided to the                            patient.                           - Resume previous diet.                           -  Continue present medications.                           - Await pathology results.                           - Repeat colonoscopy is recommended for                            surveillance of multiple  polyps. The colonoscopy                            date will be determined after pathology results                            from today's exam become available for review. Jerene Bears, MD 02/27/2019 2:02:48 PM This report has been signed electronically.

## 2019-02-27 NOTE — Patient Instructions (Signed)
YOU HAD AN ENDOSCOPIC PROCEDURE TODAY AT [facility]:   Refer to the procedure report that was given to you for any specific questions about what was found during the examination.  If the procedure report does not answer your questions, please call your gastroenterologist to clarify.  If you requested that your care partner not be given the details of your procedure findings, then the procedure report has been included in a sealed envelope for you to review at your convenience later.  YOU SHOULD EXPECT: Some feelings of bloating in the abdomen. Passage of more gas than usual.  Walking can help get rid of the air that was put into your GI tract during the procedure and reduce the bloating. If you had a lower endoscopy (such as a colonoscopy or flexible sigmoidoscopy) you may notice spotting of blood in your stool or on the toilet paper. If you underwent a bowel prep for your procedure, you may not have a normal bowel movement for a few days.  Please Note:  You might notice some irritation and congestion in your nose or some drainage.  This is from the oxygen used during your procedure.  There is no need for concern and it should clear up in a day or so.  SYMPTOMS TO REPORT IMMEDIATELY:   Following lower endoscopy (colonoscopy or flexible sigmoidoscopy):  Excessive amounts of blood in the stool  Significant tenderness or worsening of abdominal pains  Swelling of the abdomen that is new, acute  Fever of 100F or higher  For urgent or emergent issues, a gastroenterologist can be reached at any hour by calling [phone].   DIET:  We do recommend a small meal at first, but then you may proceed to your regular diet.  Drink plenty of fluids but you should avoid alcoholic beverages for 24 hours.  ACTIVITY:  You should plan to take it easy for the rest of today and you should NOT DRIVE or use heavy machinery until tomorrow (because of the sedation medicines used during the test).     FOLLOW UP: Our staff will call the number listed on your records 48-72 hours following your procedure to check on you and address any questions or concerns that you may have regarding the information given to you following your procedure. If we do not reach you, we will leave a message.  We will attempt to reach you two times.  During this call, we will ask if you have developed any symptoms of COVID 19. If you develop any symptoms (for example fever, flu-like symptoms, shortness of breath, cough etc.) before then, please call [phone].  If any biopsies were taken you will be contacted by phone or by letter within the next 1-3 weeks.  Please call us at [phone] if you have not heard about the biopsies in 3 weeks.   Await for biopsy results Polyps (handout given) Hemorrhoids (handout given) Diverticulosis (handout given)  SIGNATURES/CONFIDENTIALITY: You and/or your care partner have signed paperwork which will be entered into your electronic medical record.  These signatures attest to the fact that that the information above on your After Visit Summary has been reviewed and is understood.  Full responsibility of the confidentiality of this discharge information lies with you and/or your care-partner.

## 2019-02-28 ENCOUNTER — Encounter: Payer: Self-pay | Admitting: Podiatry

## 2019-02-28 NOTE — Progress Notes (Signed)
Subjective:  [name] presents to clinic today for follow-up of ulceration submetatarsal head 1 right foot.  Patient states ulcer appears to be completely healed.  He denies any redness, edema or pain of the area.  He denies any fever, chills, night sweats, nausea or vomiting.   He also presents for his preventative diabetic foot care on today.  He has thick, discolored, elongated toenails 1-5 b/l that become tender and cannot cut because of thickness.  Pain is aggravated when wearing enclosed shoe gear.  [name], MD is his PCP.   Current Outpatient Medications:  .  acyclovir (ZOVIRAX) 400 MG tablet, TAKE 1 TABLET TWICE DAILY, Disp: 180 tablet, Rfl: 2 .  albuterol (PROVENTIL HFA;VENTOLIN HFA) 108 (90 Base) MCG/ACT inhaler, Inhale 2 puffs into the lungs every 6 (six) hours as needed for wheezing or shortness of breath., Disp: 1 Inhaler, Rfl: 5 .  amitriptyline (ELAVIL) 50 MG tablet, TAKE 1 TABLET AT BEDTIME, Disp: 90 tablet, Rfl: 3 .  amLODipine (NORVASC) 10 MG tablet, TAKE 1 TABLET EVERY DAY, Disp: 90 tablet, Rfl: 3 .  ASCORBIC ACID PO, 1,000 mg every morning., Disp: , Rfl:  .  Blood Glucose Monitoring Suppl (TRUE METRIX AIR GLUCOSE METER) w/Device KIT, , Disp: , Rfl:  .  Cholecalciferol 1000 UNITS capsule, Take 1 capsule (1,000 Units total) by mouth 2 (two) times daily., Disp: , Rfl:  .  clotrimazole-betamethasone (LOTRISONE) cream, Apply 1 application topically 2 (two) times daily. For 7 days maximum. Stop if worsening symptoms., Disp: 45 g, Rfl: 1 .  famotidine (PEPCID) 20 MG tablet, , Disp: , Rfl:  .  finasteride (PROSCAR) 5 MG tablet, TAKE 1 TABLET EVERY DAY, Disp: 90 tablet, Rfl: 3 .  gabapentin (NEURONTIN) 300 MG capsule, TAKE 1 CAPSULE TWICE DAILY, Disp: 180 capsule, Rfl: 1 .  lisinopril (PRINIVIL,ZESTRIL) 2.5 MG tablet, Take 1 tablet (2.5 mg total) by mouth daily., Disp: 30 tablet, Rfl: 5 .  metFORMIN (GLUCOPHAGE) 1000 MG tablet, Take 1 tablet (1,000 mg total) by  mouth daily with breakfast., Disp: 180 tablet, Rfl: 3 .  metFORMIN (GLUCOPHAGE) 500 MG tablet, TAKE 1 TABLET EVERY DAY WITH BREAKFAST (Patient taking differently: Take 500 mg by mouth every evening. ), Disp: 90 tablet, Rfl: 3 .  Multiple Vitamin (MULTIVITAMIN) capsule, Take 1 capsule by mouth daily.  , Disp: , Rfl:  .  NONFORMULARY OR COMPOUNDED ITEM, [facility]  Peripheral Neuropathy Cream- Bupivacaine 1%, Doxepin 3%, Gabapentin 6%, Pentoxifylline 3%, Topiramate 1% Apply 1-2 grams to affected area 3-4 times daily Qty. 120 gm 3 refills, Disp: , Rfl:  .  omeprazole (PRILOSEC) 20 MG capsule, TAKE 1 CAPSULE DAILY 30 TO 60 MINUTES BEFORE FIRST MEAL OF THE DAY, Disp: 90 capsule, Rfl: 3 .  rosuvastatin (CRESTOR) 10 MG tablet, TAKE 1 TABLET EVERY DAY, Disp: 90 tablet, Rfl: 3 .  tamsulosin (FLOMAX) 0.4 MG CAPS capsule, TAKE 1 CAPSULE EVERY DAY, Disp: 90 capsule, Rfl: 1 .  TRUE METRIX BLOOD GLUCOSE TEST test strip, , Disp: , Rfl:  .  TRUEPLUS LANCETS 28G MISC, Use to test blood sugars two times daily. Dx: E11.9, Disp: 200 each, Rfl: 3  Current Facility-Administered Medications:  .  0.9 %  sodium chloride infusion, 500 mL, Intravenous, Once, Pyrtle, Lajuan Lines, MD   No Known Allergies   Objective: Vitals:   02/20/19 1325  Temp: 97.9 F (36.6 C)   Physical Examination: 78 year old Caucasian male in no acute distress.  Alert, awake and oriented x3. Neurovascular  Examination: Capillary refill time less than 3 seconds x 10 digits.  Palpable DP/PT pulses b/l.  Digital hair absent b/l.  No edema noted b/l.  Skin temperature gradient WNL b/l.  No pain with calf compression, no swelling, no warmth and no erythema bilaterally.  Dermatological Examination: Skin with normal turgor, texture and tone b/l.  Site of previous ulceration submetatarsal head 1 right foot noted to be hyperkeratotic.  There is visible subdermal hemorrhage present.  There is no erythema, no edema, no drainage, no  flocculence, no warmth noted.  Hyperkeratotic lesion submetatarsal head 5 of the left foot.  There is no erythema, no edema, no drainage, no flocculence, no warmth noted.  No interdigital macerations noted b/l.  Elongated, thick, discolored brittle toenails with subungual debris and pain on dorsal palpation of nailbeds 1-5 b/l.  Musculoskeletal Examination: Muscle strength 5/5 to all muscle groups b/l  Semirigid hammertoe deformity noted digits 2 through 5 bilaterally.  Neurological Examination: Sensation decreased when tested with 10 gram monofilament.  Vibratory sensation diminished bilaterally.  Assessment: 1.  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  Pre-ulcerative callus submetatarsal head 1 right foot 3.  Callus submetatarsal head 5 left foot 4.  Diabetes with neuropathy   Plan: 1. Toenails 1-5 b/l were debrided in length and girth without iatrogenic laceration. 2. Pre-ulcerative callus submetatarsal head 1 right foot and submetatarsal head 5 left foot were pared utilizing sterile scalpel blade without incident. 3. Ulceration submetatarsal head 1 right foot has completely healed.  His right foot diabetic insert was modified by our Pedorthist today to offload pressure from first metatarsal head. 4. Continue soft, supportive shoe gear daily. 5. Report any pedal injuries to medical professional. 6. Follow up 9 weeks.  He is to call our office immediately if he experiences any pedal issues or concerns in the interim 7. Patient/POA to call should there be a question/concern in there interim.

## 2019-03-01 ENCOUNTER — Telehealth: Payer: Self-pay

## 2019-03-01 NOTE — Telephone Encounter (Signed)
  Follow up Call-  Call back number 02/27/2019  Post procedure Call Back phone  # [phone]  Permission to leave phone message Yes  Some recent data might be hidden     Patient questions:  Do you have a fever, pain , or abdominal swelling? No. Pain Score  0 *  Have you tolerated food without any problems? Yes.    Have you been able to return to your normal activities? Yes.    Do you have any questions about your discharge instructions: Diet   No. Medications  No. Follow up visit  No.  Do you have questions or concerns about your Care? No.  Actions: * If pain score is 4 or above: No action needed, pain <4. 1. Have you developed a fever since your procedure? no  2.   Have you had an respiratory symptoms (SOB or cough) since your procedure? no  3.   Have you tested positive for COVID 19 since your procedure no  3.   Have you had any family members/close contacts diagnosed with the COVID 19 since your procedure?  no   If any of these questions are a yes, please inquire if patient has been seen by family doctor and route this note to Joylene John, Therapist, sports.

## 2019-03-05 ENCOUNTER — Encounter: Payer: Self-pay | Admitting: Internal Medicine

## 2019-03-07 ENCOUNTER — Other Ambulatory Visit: Payer: Self-pay | Admitting: Family Medicine

## 2019-03-14 ENCOUNTER — Telehealth: Payer: Self-pay | Admitting: Internal Medicine

## 2019-03-14 NOTE — Telephone Encounter (Signed)
Patient calling regarding hes results about his colonoscopy would like to know if they are back CB # [phone]

## 2019-03-14 NOTE — Telephone Encounter (Signed)
Result letter reviewed with pt and questions were answered.

## 2019-04-01 ENCOUNTER — Other Ambulatory Visit: Payer: Self-pay | Admitting: Family Medicine

## 2019-04-03 ENCOUNTER — Other Ambulatory Visit: Payer: Self-pay | Admitting: *Deleted

## 2019-04-03 MED ORDER — LISINOPRIL 2.5 MG PO TABS: 2.5000 mg | ORAL_TABLET | Freq: Every day | ORAL | 1 refills | Status: DC

## 2019-04-24 ENCOUNTER — Encounter: Payer: Self-pay | Admitting: Podiatry

## 2019-04-24 ENCOUNTER — Other Ambulatory Visit: Payer: Self-pay

## 2019-04-24 ENCOUNTER — Ambulatory Visit (INDEPENDENT_AMBULATORY_CARE_PROVIDER_SITE_OTHER): Payer: Medicare HMO | Admitting: Podiatry

## 2019-04-24 DIAGNOSIS — M79675 Pain in left toe(s): Secondary | ICD-10-CM

## 2019-04-24 DIAGNOSIS — L84 Corns and callosities: Secondary | ICD-10-CM

## 2019-04-24 DIAGNOSIS — M79674 Pain in right toe(s): Secondary | ICD-10-CM | POA: Diagnosis not present

## 2019-04-24 DIAGNOSIS — B351 Tinea unguium: Secondary | ICD-10-CM | POA: Diagnosis not present

## 2019-04-24 DIAGNOSIS — E1149 Type 2 diabetes mellitus with other diabetic neurological complication: Secondary | ICD-10-CM | POA: Diagnosis not present

## 2019-04-24 NOTE — Patient Instructions (Signed)
Diabetes Mellitus and Foot Care Foot care is an important part of your health, especially when you have diabetes. Diabetes may cause you to have problems because of poor blood flow (circulation) to your feet and legs, which can cause your skin to:  Become thinner and drier.  Break more easily.  Heal more slowly.  Peel and crack. You may also have nerve damage (neuropathy) in your legs and feet, causing decreased feeling in them. This means that you may not notice minor injuries to your feet that could lead to more serious problems. Noticing and addressing any potential problems early is the best way to prevent future foot problems. How to care for your feet Foot hygiene  Wash your feet daily with warm water and mild soap. Do not use hot water. Then, pat your feet and the areas between your toes until they are completely dry. Do not soak your feet as this can dry your skin.  Trim your toenails straight across. Do not dig under them or around the cuticle. File the edges of your nails with an emery board or nail file.  Apply a moisturizing lotion or petroleum jelly to the skin on your feet and to dry, brittle toenails. Use lotion that does not contain alcohol and is unscented. Do not apply lotion between your toes. Shoes and socks  Wear clean socks or stockings every day. Make sure they are not too tight. Do not wear knee-high stockings since they may decrease blood flow to your legs.  Wear shoes that fit properly and have enough cushioning. Always look in your shoes before you put them on to be sure there are no objects inside.  To break in new shoes, wear them for just a few hours a day. This prevents injuries on your feet. Wounds, scrapes, corns, and calluses  Check your feet daily for blisters, cuts, bruises, sores, and redness. If you cannot see the bottom of your feet, use a mirror or ask someone for help.  Do not cut corns or calluses or try to remove them with medicine.  If you  find a minor scrape, cut, or break in the skin on your feet, keep it and the skin around it clean and dry. You may clean these areas with mild soap and water. Do not clean the area with peroxide, alcohol, or iodine.  If you have a wound, scrape, corn, or callus on your foot, look at it several times a day to make sure it is healing and not infected. Check for: ? Redness, swelling, or pain. ? Fluid or blood. ? Warmth. ? Pus or a bad smell. General instructions  Do not cross your legs. This may decrease blood flow to your feet.  Do not use heating pads or hot water bottles on your feet. They may burn your skin. If you have lost feeling in your feet or legs, you may not know this is happening until it is too late.  Protect your feet from hot and cold by wearing shoes, such as at the beach or on hot pavement.  Schedule a complete foot exam at least once a year (annually) or more often if you have foot problems. If you have foot problems, report any cuts, sores, or bruises to your health care provider immediately. Contact a health care provider if:  You have a medical condition that increases your risk of infection and you have any cuts, sores, or bruises on your feet.  You have an injury that is not   healing.  You have redness on your legs or feet.  You feel burning or tingling in your legs or feet.  You have pain or cramps in your legs and feet.  Your legs or feet are numb.  Your feet always feel cold.  You have pain around a toenail. Get help right away if:  You have a wound, scrape, corn, or callus on your foot and: ? You have pain, swelling, or redness that gets worse. ? You have fluid or blood coming from the wound, scrape, corn, or callus. ? Your wound, scrape, corn, or callus feels warm to the touch. ? You have pus or a bad smell coming from the wound, scrape, corn, or callus. ? You have a fever. ? You have a red line going up your leg. Summary  Check your feet every day  for cuts, sores, red spots, swelling, and blisters.  Moisturize feet and legs daily.  Wear shoes that fit properly and have enough cushioning.  If you have foot problems, report any cuts, sores, or bruises to your health care provider immediately.  Schedule a complete foot exam at least once a year (annually) or more often if you have foot problems. This information is not intended to replace advice given to you by your health care provider. Make sure you discuss any questions you have with your health care provider. Document Released: 09/23/2000 Document Revised: 11/08/2017 Document Reviewed: 10/28/2016 Elsevier Patient Education  2020 [employer]   Onychomycosis/Fungal Toenails  WHAT IS IT? An infection that lies within the keratin of your nail plate that is caused by a fungus.  WHY ME? Fungal infections affect all ages, sexes, races, and creeds.  There may be many factors that predispose you to a fungal infection such as age, coexisting medical conditions such as diabetes, or an autoimmune disease; stress, medications, fatigue, genetics, etc.  Bottom line: fungus thrives in a warm, moist environment and your shoes offer such a location.  IS IT CONTAGIOUS? Theoretically, yes.  You do not want to share shoes, nail clippers or files with someone who has fungal toenails.  Walking around barefoot in the same room or sleeping in the same bed is unlikely to transfer the organism.  It is important to realize, however, that fungus can spread easily from one nail to the next on the same foot.  HOW DO WE TREAT THIS?  There are several ways to treat this condition.  Treatment may depend on many factors such as age, medications, pregnancy, liver and kidney conditions, etc.  It is best to ask your doctor which options are available to you.  1. No treatment.   Unlike many other medical concerns, you can live with this condition.  However for many people this can be a painful condition and may lead to  ingrown toenails or a bacterial infection.  It is recommended that you keep the nails cut short to help reduce the amount of fungal nail. 2. Topical treatment.  These range from herbal remedies to prescription strength nail lacquers.  About 40-50% effective, topicals require twice daily application for approximately 9 to 12 months or until an entirely new nail has grown out.  The most effective topicals are medical grade medications available through physicians offices. 3. Oral antifungal medications.  With an 80-90% cure rate, the most common oral medication requires 3 to 4 months of therapy and stays in your system for a year as the new nail grows out.  Oral antifungal medications do require   blood work to make sure it is a safe drug for you.  A liver function panel will be performed prior to starting the medication and after the first month of treatment.  It is important to have the blood work performed to avoid any harmful side effects.  In general, this medication safe but blood work is required. 4. Laser Therapy.  This treatment is performed by applying a specialized laser to the affected nail plate.  This therapy is noninvasive, fast, and non-painful.  It is not covered by insurance and is therefore, out of pocket.  The results have been very good with a 80-95% cure rate.  The [facility] is the only practice in the area to offer this therapy. 5. Permanent Nail Avulsion.  Removing the entire nail so that a new nail will not grow back. 

## 2019-04-28 NOTE — Progress Notes (Signed)
Subjective: [name] presents for preventative diabetic foot care with diabetes and diabetic neuropathy.  He has h/o ulceration submet head 1 right foot.   He voices no new pedal concerns on today's visit.  [name], MD is his PCP.    Current Outpatient Medications:  .  acyclovir (ZOVIRAX) 400 MG tablet, TAKE 1 TABLET TWICE DAILY, Disp: 180 tablet, Rfl: 1 .  albuterol (PROVENTIL HFA;VENTOLIN HFA) 108 (90 Base) MCG/ACT inhaler, Inhale 2 puffs into the lungs every 6 (six) hours as needed for wheezing or shortness of breath., Disp: 1 Inhaler, Rfl: 5 .  amitriptyline (ELAVIL) 50 MG tablet, TAKE 1 TABLET AT BEDTIME, Disp: 90 tablet, Rfl: 3 .  amLODipine (NORVASC) 10 MG tablet, TAKE 1 TABLET EVERY DAY, Disp: 90 tablet, Rfl: 3 .  ASCORBIC ACID PO, 1,000 mg every morning., Disp: , Rfl:  .  Blood Glucose Monitoring Suppl (TRUE METRIX AIR GLUCOSE METER) w/Device KIT, , Disp: , Rfl:  .  Cholecalciferol 1000 UNITS capsule, Take 1 capsule (1,000 Units total) by mouth 2 (two) times daily., Disp: , Rfl:  .  clotrimazole-betamethasone (LOTRISONE) cream, Apply 1 application topically 2 (two) times daily. For 7 days maximum. Stop if worsening symptoms., Disp: 45 g, Rfl: 1 .  famotidine (PEPCID) 20 MG tablet, , Disp: , Rfl:  .  finasteride (PROSCAR) 5 MG tablet, TAKE 1 TABLET EVERY DAY, Disp: 90 tablet, Rfl: 3 .  gabapentin (NEURONTIN) 300 MG capsule, TAKE 1 CAPSULE TWICE DAILY, Disp: 180 capsule, Rfl: 1 .  lisinopril (ZESTRIL) 2.5 MG tablet, Take 1 tablet (2.5 mg total) by mouth daily., Disp: 90 tablet, Rfl: 1 .  metFORMIN (GLUCOPHAGE) 1000 MG tablet, Take 1 tablet (1,000 mg total) by mouth daily with breakfast., Disp: 180 tablet, Rfl: 3 .  metFORMIN (GLUCOPHAGE) 500 MG tablet, TAKE 1 TABLET EVERY DAY WITH BREAKFAST (Patient taking differently: Take 500 mg by mouth every evening. ), Disp: 90 tablet, Rfl: 3 .  Multiple Vitamin (MULTIVITAMIN) capsule, Take 1 capsule by mouth daily.  , Disp: ,  Rfl:  .  NONFORMULARY OR COMPOUNDED ITEM, [facility]  Peripheral Neuropathy Cream- Bupivacaine 1%, Doxepin 3%, Gabapentin 6%, Pentoxifylline 3%, Topiramate 1% Apply 1-2 grams to affected area 3-4 times daily Qty. 120 gm 3 refills, Disp: , Rfl:  .  omeprazole (PRILOSEC) 20 MG capsule, TAKE 1 CAPSULE DAILY 30 TO 60 MINUTES BEFORE FIRST MEAL OF THE DAY, Disp: 90 capsule, Rfl: 3 .  rosuvastatin (CRESTOR) 10 MG tablet, TAKE 1 TABLET EVERY DAY, Disp: 90 tablet, Rfl: 3 .  tamsulosin (FLOMAX) 0.4 MG CAPS capsule, TAKE 1 CAPSULE EVERY DAY, Disp: 90 capsule, Rfl: 1 .  TRUE METRIX BLOOD GLUCOSE TEST test strip, , Disp: , Rfl:  .  TRUEPLUS LANCETS 28G MISC, Use to test blood sugars two times daily. Dx: E11.9, Disp: 200 each, Rfl: 3  Current Facility-Administered Medications:  .  0.9 %  sodium chloride infusion, 500 mL, Intravenous, Once, Pyrtle, Lajuan Lines, MD  No Known Allergies  Objective:  Vascular Examination: Capillary refill time <3 seconds  x 10 digits.  Dorsalis pedis pulses palpable b/l.  Posterior tibial pulses palpable b/l.  Digital hair absent x 10 digits.  Skin temperature gradient WNL b/l.  Dermatological Examination: Skin with normal turgor, texture and tone b/l.  Toenails 1-5 b/l discolored, thick, dystrophic with subungual debris and pain with palpation to nailbeds due to thickness of nails.  Hyperkeratotic lesion submet head 1 right foot, submet head 5 left foot. No  erythema, no edema, no drainage, no flocculence noted.   Musculoskeletal: Muscle strength 5/5 to all LE muscle groups.  Hammertoe deformity 1-5 b/l.   No pain, crepitus or joint limitation with passive/active ROM.  Neurological: Sensation diminished with 10 gram monofilament.  Vibratory sensation diminished  Assessment: 1. Painful onychomycosis toenails 1-5 b/l 2. Calluses submet head 1 right foot, submet head 5 left foot 3. NIDDM with Diabetic neuropathy  Plan: 1. Continue diabetic foot care  principles. Literature dispensed on today. 2. Toenails 1-5 b/l were debrided in length and girth without iatrogenic bleeding. 3. Calluses pared submet head 1 right foot, submet head 5 left foot utilizing sterile scalpel blade without incident.pared utilizing sterile scalpel blade without incident.  4. Patient to continue soft, supportive shoe gear 5. Patient to report any pedal injuries to medical professional  6. Follow up 9 weeks. 7. Patient/POA to call should there be a concern in the interim.

## 2019-05-28 ENCOUNTER — Other Ambulatory Visit: Payer: Self-pay | Admitting: Family Medicine

## 2019-06-25 ENCOUNTER — Telehealth: Payer: Self-pay | Admitting: Family Medicine

## 2019-06-25 NOTE — Telephone Encounter (Signed)
I left a message asking the patient to call me at [phone] to schedule AWV with Courtney. VDM (Dee-Dee) °

## 2019-06-27 ENCOUNTER — Other Ambulatory Visit: Payer: Self-pay | Admitting: Family Medicine

## 2019-07-03 ENCOUNTER — Ambulatory Visit (INDEPENDENT_AMBULATORY_CARE_PROVIDER_SITE_OTHER): Payer: Medicare HMO

## 2019-07-03 ENCOUNTER — Other Ambulatory Visit: Payer: Self-pay

## 2019-07-03 DIAGNOSIS — Z0000 Encounter for general adult medical examination without abnormal findings: Secondary | ICD-10-CM | POA: Diagnosis not present

## 2019-07-03 NOTE — Progress Notes (Signed)
I have reviewed and agree with note, evaluation, plan. Happy to see him for knee or hip pain.   [name], MD

## 2019-07-03 NOTE — Progress Notes (Signed)
This visit is being conducted via phone call due to the COVID-19 pandemic. This patient has given me verbal consent via phone to conduct this visit, patient states they are participating from their home address. Some vital signs may be absent or patient reported.   Patient identification: identified by name, DOB, and current address.   Subjective:   [name] is a 78 y.o. male who presents for Medicare Annual/Subsequent preventive examination.  Review of Systems:   Cardiac Risk Factors include: advanced age (>76mn, >>74women);diabetes mellitus;dyslipidemia;hypertension  Objective:    Vitals: There were no vitals taken for this visit.  There is no height or weight on file to calculate BMI.  Advanced Directives 07/03/2019 06/21/2018 02/28/2018 05/11/2017  Does Patient Have a Medical Advance Directive? No No No No  Would patient like information on creating a medical advance directive? Yes (MAU/Ambulatory/Procedural Areas - Information given) No - Patient declined No - Patient declined -    Tobacco Social History   Tobacco Use  Smoking Status Former Smoker  . Packs/day: 4.00  . Years: 20.00  . Pack years: 80.00  . Types: Cigarettes  . Quit date: 09/18/1984  . Years since quitting: 34.8  Smokeless Tobacco Never Used  Tobacco Comment   quit in 1980     Counseling given: Not Answered Comment: quit in 1980   Clinical Intake:  Pre-visit preparation completed: Yes  Pain : 0-10 Pain Score: 2  Pain Type: Acute pain Pain Location: Knee Pain Orientation: Left Pain Descriptors / Indicators: Aching Pain Onset: 1 to 4 weeks ago  Diabetes: Yes CBG done?: No Did pt. bring in CBG monitor from home?: No  How often do you need to have someone help you when you read instructions, pamphlets, or other written materials from your doctor or pharmacy?: 1 - Never  Interpreter Needed?: No  Information entered by :: CDenman GeorgeLPN  Past Medical History:  Diagnosis Date  .  Benign prostatic hypertrophy   . COPD (chronic obstructive pulmonary disease) ([facility] December 15, 2009   FEV1 2.30 (70%) ratio 63 no better with B2 and DLCO 18/6 (73%) corrects to 106%  . Coughing December 18, 2009   Sinus CT : Minor mucosal thickening at the Right Frontoethmoidal recess, otherwise  negative sinuses  . Degenerative joint disease   . Depression   . Diabetes mellitus type II   . Diverticulosis   . Fatty liver   . Hyperlipidemia   . Hypertension August 12, 2009   try off ACE for upper airway cough  . Internal hemorrhoids   . Low back pain   . Otitis externa 07/30/2012  . Splenomegaly   . Ulcer of foot ([facility]    right foot   Past Surgical History:  Procedure Laterality Date  . dvt  1980   Family History  Problem Relation Age of Onset  . Cancer Mother        melanoma  . Stroke Father    Social History   Socioeconomic History  . Marital status: Married    Spouse name: Not on file  . Number of children: Not on file  . Years of education: Not on file  . Highest education level: Not on file  Occupational History  . Occupation: retired    Comment: maintenence work  SScientific laboratory technician . Financial resource strain: Not on file  . Food insecurity    Worry: Not on file    Inability: Not on file  . Transportation needs  Medical: Not on file    Non-medical: Not on file  Tobacco Use  . Smoking status: Former Smoker    Packs/day: 4.00    Years: 20.00    Pack years: 80.00    Types: Cigarettes    Quit date: 09/18/1984    Years since quitting: 34.8  . Smokeless tobacco: Never Used  . Tobacco comment: quit in 1980  Substance and Sexual Activity  . 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 Drug use: No  . Sexual activity: Yes  Lifestyle  . Physical activity    Days per week: Not on file    Minutes per session: Not on file  . Stress: Not on file  Relationships  . Social Herbalist on phone: Not on file    Gets together: Not on file     Attends religious service: Not on file    Active member of club or organization: Not on file    Attends meetings of clubs or organizations: Not on file    Relationship status: Not on file  Other Topics Concern  . Not on file  Social History Narrative   Married 53 years in 2015. 4 kids (2 sons) and 3 grandkids.    Lived in [place], [place] wholel life      Retired from [employer], going to [employer] where they have a camper             Outpatient Encounter Medications as of 07/03/2019  Medication Sig  . acyclovir (ZOVIRAX) 400 MG tablet TAKE 1 TABLET TWICE DAILY  . albuterol (PROVENTIL HFA;VENTOLIN HFA) 108 (90 Base) MCG/ACT inhaler Inhale 2 puffs into the lungs every 6 (six) hours as needed for wheezing or shortness of breath.  Marland Kitchen amitriptyline (ELAVIL) 50 MG tablet TAKE 1 TABLET AT BEDTIME  . amLODipine (NORVASC) 10 MG tablet TAKE 1 TABLET EVERY DAY  . ASCORBIC ACID PO 1,000 mg every morning.  . Blood Glucose Monitoring Suppl (TRUE METRIX AIR GLUCOSE METER) w/Device KIT   . Cholecalciferol 1000 UNITS capsule Take 1 capsule (1,000 Units total) by mouth 2 (two) times daily.  . clotrimazole-betamethasone (LOTRISONE) cream Apply 1 application topically 2 (two) times daily. For 7 days maximum. Stop if worsening symptoms.  . famotidine (PEPCID) 20 MG tablet TAKE 1 TABLET TWICE DAILY  . finasteride (PROSCAR) 5 MG tablet TAKE 1 TABLET EVERY DAY  . gabapentin (NEURONTIN) 300 MG capsule TAKE 1 CAPSULE TWICE DAILY  . glucose blood (TRUE METRIX BLOOD GLUCOSE TEST) test strip USE  TO TEST BLOOD SUGAR EVERY DAY  . lisinopril (ZESTRIL) 2.5 MG tablet Take 1 tablet (2.5 mg total) by mouth daily.  . metFORMIN (GLUCOPHAGE) 1000 MG tablet Take 1 tablet (1,000 mg total) by mouth daily with breakfast.  . metFORMIN (GLUCOPHAGE) 500 MG tablet TAKE 1 TABLET EVERY DAY WITH BREAKFAST (Patient taking differently: Take 500 mg by mouth every evening. )  . Multiple Vitamin (MULTIVITAMIN) capsule  Take 1 capsule by mouth daily.    . NONFORMULARY OR COMPOUNDED ITEM [facility]  Peripheral Neuropathy Cream- Bupivacaine 1%, Doxepin 3%, Gabapentin 6%, Pentoxifylline 3%, Topiramate 1% Apply 1-2 grams to affected area 3-4 times daily Qty. 120 gm 3 refills  . omeprazole (PRILOSEC) 20 MG capsule TAKE 1 CAPSULE DAILY 30 TO 60 MINUTES BEFORE FIRST MEAL OF THE DAY  . rosuvastatin (CRESTOR) 10 MG tablet TAKE 1 TABLET EVERY DAY  . tamsulosin (  FLOMAX) 0.4 MG CAPS capsule TAKE 1 CAPSULE EVERY DAY  . TRUEPLUS LANCETS 28G MISC Use to test blood sugars two times daily. Dx: E11.9   Facility-Administered Encounter Medications as of 07/03/2019  Medication  . 0.9 %  sodium chloride infusion    Activities of Daily Living In your present state of health, do you have any difficulty performing the following activities: 07/03/2019  Hearing? N  Vision? N  Difficulty concentrating or making decisions? N  Walking or climbing stairs? N  Dressing or bathing? N  Doing errands, shopping? N  Preparing Food and eating ? N  Using the Toilet? N  In the past six months, have you accidently leaked urine? N  Do you have problems with loss of bowel control? N  Managing your Medications? N  Managing your Finances? N  Housekeeping or managing your Housekeeping? N  Some recent data might be hidden    Patient Care Team: [name], MD as PCP - General (Family Medicine) Marilynne Halsted, MD as Referring Physician (Ophthalmology) Brunetta Genera, MD as Consulting Physician (Hematology) Pyrtle, Lajuan Lines, MD as Consulting Physician (Gastroenterology) Marzetta Board, DPM as Consulting Physician (Podiatry)   Assessment:   This is a routine wellness examination for [employer].  Exercise Activities and Dietary recommendations Current Exercise Habits: The patient does not participate in regular exercise at present  Goals    . patient centered     Wants to fup on pain to be more mobile               Fall Risk Fall Risk  07/03/2019 01/29/2018 01/29/2018 01/29/2018 05/11/2017  Falls in the past year? 1 No No No Yes  Number falls in past yr: 1 - - - 1  Injury with Fall? 1 - - - -  Risk for fall due to : History of fall(s) - - - -  Follow up Education provided;Falls prevention discussed;Falls evaluation completed - - - Education provided   Is the patient's home free of loose throw rugs in walkways, pet beds, electrical cords, etc?   yes      Grab bars in the bathroom? yes      Handrails on the stairs?   yes      Adequate lighting?   yes  Depression Screen PHQ 2/9 Scores 07/03/2019 10/22/2018 01/29/2018 12/21/2016  PHQ - 2 Score 0 1 0 0  PHQ- 9 Score - 3 - -    Cognitive Function-no cognitive concerns at this time  MMSE - Mini Mental State Exam 05/11/2017  Orientation to time 5  Orientation to Place 5  Registration 3  Attention/ Calculation 4  Recall 1  Language- name 2 objects 2  Language- repeat 1  Language- follow 3 step command 3  Language- read & follow direction 1     6CIT Screen 07/03/2019 05/11/2017  What Year? 0 points 0 points  What month? 0 points 0 points  What time? 0 points 0 points  Count back from 20 0 points 0 points  Months in reverse 0 points 2 points  Repeat phrase 2 points 4 points  Total Score 2 6    Immunization History  Administered Date(s) Administered  . Influenza Split 09/07/2011, 07/30/2012  . Influenza Whole 10/10/2005, 08/31/2009, 07/10/2010  . Influenza, High Dose Seasonal PF 06/22/2016, 06/29/2017, 09/20/2018  . Influenza,inj,Quad PF,6+ Mos 07/01/2013, 06/20/2014  . Influenza-Unspecified 10/26/2015, 09/12/2018  . Meningococcal Conjugate 07/24/2014  . Pneumococcal Conjugate-13 07/24/2014  . Pneumococcal Polysaccharide-23 08/31/2009  .  Td 06/10/2006  . Zoster Recombinat (Shingrix) 12/11/2018, 05/03/2019    Qualifies for Shingles Vaccine? shingrix completed   Screening Tests Health Maintenance  Topic Date Due  . TETANUS/TDAP   06/10/2016  . OPHTHALMOLOGY EXAM  01/25/2019  . INFLUENZA VACCINE  05/11/2019  . HEMOGLOBIN A1C  08/25/2019  . FOOT EXAM  02/20/2020  . COLONOSCOPY  02/27/2020  . PNA vac Low Risk Adult  Completed   Cancer Screenings: Lung: Low Dose CT Chest recommended if Age 32-80 years, 30 pack-year currently smoking OR have quit w/in 15years. Patient does not qualify. Colorectal: colonoscopy 02/27/19 with Dr. Hilarie Fredrickson     Plan:  I have personally reviewed and addressed the Medicare Annual Wellness questionnaire and have noted the following in the patient's chart:  A. Medical and social history B. Use of alcohol, tobacco or illicit drugs  C. Current medications and supplements D. Functional ability and status E.  Nutritional status F.  Physical activity G. Advance directives H. List of other physicians I.  Hospitalizations, surgeries, and ER visits in previous 12 months J.  [place] such as hearing and vision if needed, cognitive and depression L. Referrals, records requested, and appointments- none (patient has self referred for diabetic eye exam and will be seen within the next month)   In addition, I have reviewed and discussed with patient certain preventive protocols, quality metrics, and best practice recommendations. A written personalized care plan for preventive services as well as general preventive health recommendations were provided to patient.   Signed,  Denman [name], LPN  Nurse Health Advisor   Nurse Notes: Patient with fall within the last month.  States that he is sore in left hip and knee.  Did not want to schedule additional appointment at this time but will call if felt that it is needed.

## 2019-07-03 NOTE — Patient Instructions (Signed)
[name] , Thank you for taking time to come for your Medicare Wellness Visit. I appreciate your ongoing commitment to your health goals. Please review the following plan we discussed and let me know if I can assist you in the future.   Screening recommendations/referrals: Colorectal Screening: completed last 02/27/19 with Dr. Hilarie Fredrickson   Vision and Dental Exams: Recommended annual ophthalmology exams for early detection of glaucoma and other disorders of the eye Recommended annual dental exams for proper oral hygiene  Diabetic Exams: Diabetic Eye Exam: noted that this has been scheduled Diabetic Foot Exam: up to date   Vaccinations: Influenza vaccine:  recommended this fall either at PCP office or through your local pharmacy  Pneumococcal vaccine: up to date; last 07/24/14 (Dr. Yong Channel may recommend a booster at your next visit)  Tdap vaccine: Please call your insurance company to determine your out of pocket expense. You may also receive this vaccine at your local pharmacy or Health Dept. Shingles vaccine: Shingrix completed   Advanced directives: Advance directives discussed with you today. I have provided a copy for you to complete at home and have notarized. Once this is complete please bring a copy in to our office so we can scan it into your chart.  Goals: Recommend to remove any items from the home that may cause slips or trips.  Next appointment: Please schedule your Annual Wellness Visit with your Nurse Health Advisor in one year.  Preventive Care 78 Years and Older, Male Preventive care refers to lifestyle choices and visits with your health care provider that can promote health and wellness. What does preventive care include?  A yearly physical exam. This is also called an annual well check.  Dental exams once or twice a year.  Routine eye exams. Ask your health care provider how often you should have your eyes checked.  Personal lifestyle choices, including:  Daily  care of your teeth and gums.  Regular physical activity.  Eating a healthy diet.  Avoiding tobacco and drug use.  Limiting alcohol use.  Practicing safe sex.  Taking low doses of aspirin every day if recommended by your health care provider..  Taking vitamin and mineral supplements as recommended by your health care provider. What happens during an annual well check? The services and screenings done by your health care provider during your annual well check will depend on your age, overall health, lifestyle risk factors, and family history of disease. Counseling  Your health care provider may ask you questions about your:  Alcohol use.  Tobacco use.  Drug use.  Emotional well-being.  Home and relationship well-being.  Sexual activity.  Eating habits.  History of falls.  Memory and ability to understand (cognition).  Work and work Statistician. Screening  You may have the following tests or measurements:  Height, weight, and BMI.  Blood pressure.  Lipid and cholesterol levels. These may be checked every 5 years, or more frequently if you are over 18 years old.  Skin check.  Lung cancer screening. You may have this screening every year starting at age 60 if you have a 30-pack-year history of smoking and currently smoke or have quit within the past 15 years.  Fecal occult blood test (FOBT) of the stool. You may have this test every year starting at age 31.  Flexible sigmoidoscopy or colonoscopy. You may have a sigmoidoscopy every 5 years or a colonoscopy every 10 years starting at age 16.  Prostate cancer screening. Recommendations will vary depending on your  family history and other risks.  Hepatitis C blood test.  Hepatitis B blood test.  Sexually transmitted disease (STD) testing.  Diabetes screening. This is done by checking your blood sugar (glucose) after you have not eaten for a while (fasting). You may have this done every 1-3 years.  Abdominal  aortic aneurysm (AAA) screening. You may need this if you are a current or former smoker.  Osteoporosis. You may be screened starting at age 17 if you are at high risk. Talk with your health care provider about your test results, treatment options, and if necessary, the need for more tests. Vaccines  Your health care provider may recommend certain vaccines, such as:  Influenza vaccine. This is recommended every year.  Tetanus, diphtheria, and acellular pertussis (Tdap, Td) vaccine. You may need a Td booster every 10 years.  Zoster vaccine. You may need this after age 7.  Pneumococcal 13-valent conjugate (PCV13) vaccine. One dose is recommended after age 69.  Pneumococcal polysaccharide (PPSV23) vaccine. One dose is recommended after age 39. Talk to your health care provider about which screenings and vaccines you need and how often you need them. This information is not intended to replace advice given to you by your health care provider. Make sure you discuss any questions you have with your health care provider. Document Released: 10/23/2015 Document Revised: 06/15/2016 Document Reviewed: 07/28/2015 Elsevier Interactive Patient Education  2017 [place] Prevention in the Home Falls can cause injuries. They can happen to people of all ages. There are many things you can do to make your home safe and to help prevent falls. What can I do on the outside of my home?  Regularly fix the edges of walkways and driveways and fix any cracks.  Remove anything that might make you trip as you walk through a door, such as a raised step or threshold.  Trim any bushes or trees on the path to your home.  Use bright outdoor lighting.  Clear any walking paths of anything that might make someone trip, such as rocks or tools.  Regularly check to see if handrails are loose or broken. Make sure that both sides of any steps have handrails.  Any raised decks and porches should have  guardrails on the edges.  Have any leaves, snow, or ice cleared regularly.  Use sand or salt on walking paths during winter.  Clean up any spills in your garage right away. This includes oil or grease spills. What can I do in the bathroom?  Use night lights.  Install grab bars by the toilet and in the tub and shower. Do not use towel bars as grab bars.  Use non-skid mats or decals in the tub or shower.  If you need to sit down in the shower, use a plastic, non-slip stool.  Keep the floor dry. Clean up any water that spills on the floor as soon as it happens.  Remove soap buildup in the tub or shower regularly.  Attach bath mats securely with double-sided non-slip rug tape.  Do not have throw rugs and other things on the floor that can make you trip. What can I do in the bedroom?  Use night lights.  Make sure that you have a light by your bed that is easy to reach.  Do not use any sheets or blankets that are too big for your bed. They should not hang down onto the floor.  Have a firm chair that has side arms. You can  use this for support while you get dressed.  Do not have throw rugs and other things on the floor that can make you trip. What can I do in the kitchen?  Clean up any spills right away.  Avoid walking on wet floors.  Keep items that you use a lot in easy-to-reach places.  If you need to reach something above you, use a strong step stool that has a grab bar.  Keep electrical cords out of the way.  Do not use floor polish or wax that makes floors slippery. If you must use wax, use non-skid floor wax.  Do not have throw rugs and other things on the floor that can make you trip. What can I do with my stairs?  Do not leave any items on the stairs.  Make sure that there are handrails on both sides of the stairs and use them. Fix handrails that are broken or loose. Make sure that handrails are as long as the stairways.  Check any carpeting to make sure that  it is firmly attached to the stairs. Fix any carpet that is loose or worn.  Avoid having throw rugs at the top or bottom of the stairs. If you do have throw rugs, attach them to the floor with carpet tape.  Make sure that you have a light switch at the top of the stairs and the bottom of the stairs. If you do not have them, ask someone to add them for you. What else can I do to help prevent falls?  Wear shoes that:  Do not have high heels.  Have rubber bottoms.  Are comfortable and fit you well.  Are closed at the toe. Do not wear sandals.  If you use a stepladder:  Make sure that it is fully opened. Do not climb a closed stepladder.  Make sure that both sides of the stepladder are locked into place.  Ask someone to hold it for you, if possible.  Clearly mark and make sure that you can see:  Any grab bars or handrails.  First and last steps.  Where the edge of each step is.  Use tools that help you move around (mobility aids) if they are needed. These include:  Canes.  Walkers.  Scooters.  Crutches.  Turn on the lights when you go into a dark area. Replace any light bulbs as soon as they burn out.  Set up your furniture so you have a clear path. Avoid moving your furniture around.  If any of your floors are uneven, fix them.  If there are any pets around you, be aware of where they are.  Review your medicines with your doctor. Some medicines can make you feel dizzy. This can increase your chance of falling. Ask your doctor what other things that you can do to help prevent falls. This information is not intended to replace advice given to you by your health care provider. Make sure you discuss any questions you have with your health care provider. Document Released: 07/23/2009 Document Revised: 03/03/2016 Document Reviewed: 10/31/2014 Elsevier Interactive Patient Education  2017 [employer].

## 2019-07-09 ENCOUNTER — Ambulatory Visit: Payer: Medicare HMO | Admitting: Podiatry

## 2019-08-23 ENCOUNTER — Other Ambulatory Visit: Payer: Self-pay | Admitting: Family Medicine

## 2019-08-30 ENCOUNTER — Other Ambulatory Visit: Payer: Self-pay | Admitting: Family Medicine

## 2019-09-11 DIAGNOSIS — H18592 Other hereditary corneal dystrophies, left eye: Secondary | ICD-10-CM | POA: Diagnosis not present

## 2019-09-11 DIAGNOSIS — Z947 Corneal transplant status: Secondary | ICD-10-CM | POA: Diagnosis not present

## 2019-09-11 DIAGNOSIS — Z961 Presence of intraocular lens: Secondary | ICD-10-CM | POA: Diagnosis not present

## 2019-11-04 ENCOUNTER — Other Ambulatory Visit: Payer: Self-pay | Admitting: Family Medicine

## 2019-11-04 DIAGNOSIS — Z947 Corneal transplant status: Secondary | ICD-10-CM | POA: Diagnosis not present

## 2019-11-04 DIAGNOSIS — H18599 Other hereditary corneal dystrophies, unspecified eye: Secondary | ICD-10-CM | POA: Diagnosis not present

## 2019-11-04 DIAGNOSIS — Z961 Presence of intraocular lens: Secondary | ICD-10-CM | POA: Diagnosis not present

## 2019-11-07 ENCOUNTER — Encounter: Payer: Self-pay | Admitting: Family Medicine

## 2019-11-07 ENCOUNTER — Ambulatory Visit (INDEPENDENT_AMBULATORY_CARE_PROVIDER_SITE_OTHER): Payer: Medicare HMO | Admitting: Family Medicine

## 2019-11-07 ENCOUNTER — Other Ambulatory Visit: Payer: Self-pay

## 2019-11-07 VITALS — BP 122/68 | HR 72 | Temp 97.4°F | Ht 69.0 in | Wt 207.2 lb

## 2019-11-07 DIAGNOSIS — L97412 Non-pressure chronic ulcer of right heel and midfoot with fat layer exposed: Secondary | ICD-10-CM | POA: Diagnosis not present

## 2019-11-07 DIAGNOSIS — J449 Chronic obstructive pulmonary disease, unspecified: Secondary | ICD-10-CM | POA: Diagnosis not present

## 2019-11-07 DIAGNOSIS — E08621 Diabetes mellitus due to underlying condition with foot ulcer: Secondary | ICD-10-CM | POA: Insufficient documentation

## 2019-11-07 DIAGNOSIS — E1142 Type 2 diabetes mellitus with diabetic polyneuropathy: Secondary | ICD-10-CM | POA: Diagnosis not present

## 2019-11-07 DIAGNOSIS — M5442 Lumbago with sciatica, left side: Secondary | ICD-10-CM

## 2019-11-07 DIAGNOSIS — N183 Chronic kidney disease, stage 3 unspecified: Secondary | ICD-10-CM

## 2019-11-07 DIAGNOSIS — E538 Deficiency of other specified B group vitamins: Secondary | ICD-10-CM

## 2019-11-07 DIAGNOSIS — D696 Thrombocytopenia, unspecified: Secondary | ICD-10-CM

## 2019-11-07 DIAGNOSIS — M170 Bilateral primary osteoarthritis of knee: Secondary | ICD-10-CM

## 2019-11-07 DIAGNOSIS — F3342 Major depressive disorder, recurrent, in full remission: Secondary | ICD-10-CM | POA: Diagnosis not present

## 2019-11-07 DIAGNOSIS — Z0000 Encounter for general adult medical examination without abnormal findings: Secondary | ICD-10-CM | POA: Diagnosis not present

## 2019-11-07 DIAGNOSIS — E1169 Type 2 diabetes mellitus with other specified complication: Secondary | ICD-10-CM

## 2019-11-07 DIAGNOSIS — E785 Hyperlipidemia, unspecified: Secondary | ICD-10-CM

## 2019-11-07 DIAGNOSIS — J849 Interstitial pulmonary disease, unspecified: Secondary | ICD-10-CM | POA: Diagnosis not present

## 2019-11-07 DIAGNOSIS — B0230 Zoster ocular disease, unspecified: Secondary | ICD-10-CM

## 2019-11-07 DIAGNOSIS — K760 Fatty (change of) liver, not elsewhere classified: Secondary | ICD-10-CM

## 2019-11-07 DIAGNOSIS — I10 Essential (primary) hypertension: Secondary | ICD-10-CM

## 2019-11-07 DIAGNOSIS — G8929 Other chronic pain: Secondary | ICD-10-CM

## 2019-11-07 LAB — CBC WITH DIFFERENTIAL/PLATELET
Basophils Absolute: 0 10*3/uL (ref 0.0–0.1)
Basophils Relative: 0.8 % (ref 0.0–3.0)
Eosinophils Absolute: 0.1 10*3/uL (ref 0.0–0.7)
Eosinophils Relative: 1.6 % (ref 0.0–5.0)
HCT: 31.5 % — ABNORMAL LOW (ref 39.0–52.0)
Hemoglobin: 10.6 g/dL — ABNORMAL LOW (ref 13.0–17.0)
Lymphocytes Relative: 26.2 % (ref 12.0–46.0)
Lymphs Abs: 1.4 10*3/uL (ref 0.7–4.0)
MCHC: 33.7 g/dL (ref 30.0–36.0)
MCV: 102 fl — ABNORMAL HIGH (ref 78.0–100.0)
Monocytes Absolute: 0.3 10*3/uL (ref 0.1–1.0)
Monocytes Relative: 6.2 % (ref 3.0–12.0)
Neutro Abs: 3.5 10*3/uL (ref 1.4–7.7)
Neutrophils Relative %: 65.2 % (ref 43.0–77.0)
Platelets: 124 10*3/uL — ABNORMAL LOW (ref 150.0–400.0)
RBC: 3.09 Mil/uL — ABNORMAL LOW (ref 4.22–5.81)
RDW: 15.6 % — ABNORMAL HIGH (ref 11.5–15.5)
WBC: 5.3 10*3/uL (ref 4.0–10.5)

## 2019-11-07 LAB — COMPREHENSIVE METABOLIC PANEL
ALT: 11 U/L (ref 0–53)
AST: 21 U/L (ref 0–37)
Albumin: 3.7 g/dL (ref 3.5–5.2)
Alkaline Phosphatase: 91 U/L (ref 39–117)
BUN: 14 mg/dL (ref 6–23)
CO2: 27 mEq/L (ref 19–32)
Calcium: 9.2 mg/dL (ref 8.4–10.5)
Chloride: 107 mEq/L (ref 96–112)
Creatinine, Ser: 1.01 mg/dL (ref 0.40–1.50)
GFR: 71.3 mL/min (ref >60.00)
Glucose, Bld: 123 mg/dL — ABNORMAL HIGH (ref 70–99)
Potassium: 4.4 mEq/L (ref 3.5–5.1)
Sodium: 141 mEq/L (ref 135–145)
Total Bilirubin: 0.4 mg/dL (ref 0.2–1.2)
Total Protein: 6.3 g/dL (ref 6.0–8.3)

## 2019-11-07 LAB — LIPID PANEL
Cholesterol: 120 mg/dL (ref 0–200)
HDL: 45.6 mg/dL (ref >39.00)
LDL Cholesterol: 49 mg/dL (ref 0–99)
NonHDL: 74.69
Total CHOL/HDL Ratio: 3
Triglycerides: 126 mg/dL (ref 0.0–149.0)
VLDL: 25.2 mg/dL (ref 0.0–40.0)

## 2019-11-07 LAB — POCT GLYCOSYLATED HEMOGLOBIN (HGB A1C): Hemoglobin A1C: 5.9 % — AB (ref 4.0–5.6)

## 2019-11-07 LAB — VITAMIN B12: Vitamin B-12: 598 pg/mL (ref 211–911)

## 2019-11-07 MED ORDER — CLOTRIMAZOLE-BETAMETHASONE 1-0.05 % EX CREA: 1.0000 "application " | TOPICAL_CREAM | Freq: Two times a day (BID) | CUTANEOUS | 1 refills | Status: DC

## 2019-11-07 NOTE — Progress Notes (Signed)
Phone: [phone]   Subjective:  Patient presents today for their annual physical. Chief complaint-noted.   See problem oriented charting- ROS- full  review of systems was completed and negative  except for: continued low back pain, very joint pains, shortnss of breath better with albuterol  The following were reviewed and entered/updated in epic: Past Medical History:  Diagnosis Date  . Benign prostatic hypertrophy   . COPD (chronic obstructive pulmonary disease) ([place]) December 15, 2009   FEV1 2.30 (70%) ratio 63 no better with B2 and DLCO 18/6 (73%) corrects to 106%  . Coughing December 18, 2009   Sinus CT : Minor mucosal thickening at the Right Frontoethmoidal recess, otherwise  negative sinuses  . Degenerative joint disease   . Depression   . Diabetes mellitus type II   . Diverticulosis   . Fatty liver   . Hyperlipidemia   . Hypertension August 12, 2009   try off ACE for upper airway cough  . Internal hemorrhoids   . Low back pain   . Otitis externa 07/30/2012  . Splenomegaly   . Ulcer of foot ([place])    right foot   Patient Active Problem List   Diagnosis Date Noted  . Thrombocytopenia ([place]) 12/21/2015    Priority: High  . 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 CKD (chronic kidney disease), stage III 05/06/2015    Priority: High  . ILD (interstitial lung disease) ([place]) 07/05/2012    Priority: High  . COPD (chronic obstructive pulmonary disease) ([place]) 12/15/2009    Priority: High  . Chronic back pain. Off narcotics 06/05/17 due to negative UDS for opiates x2. Full history 06/12/14. Pain contract signed.  05/02/2007    Priority: High  . DM (diabetes mellitus) type II controlled, neurological manifestation ([place]) 04/06/2007    Priority: High  . B12 deficiency 08/20/2015    Priority: Medium  . Diabetic polyneuropathy associated with type 2 diabetes mellitus ([place]) 08/07/2015    Priority: Medium  . Fatty liver 11/04/2014    Priority: Medium  . BPH (benign prostatic  hyperplasia) 06/20/2007    Priority: Medium  . Depression 05/02/2007    Priority: Medium  . Hyperlipidemia associated with type 2 diabetes mellitus ([place]) 04/06/2007    Priority: Medium  . Essential hypertension 04/06/2007    Priority: Medium  . Osteoarthritis 04/06/2007    Priority: Medium  . GERD (gastroesophageal reflux disease) 09/30/2017    Priority: Low  . 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 positive 03/29/2016    Priority: Low  . 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 Diabetic ulcer of right midfoot associated with diabetes mellitus due to underlying condition, with fat layer exposed ([place]) 11/07/2019  . Anemia 03/05/2018  . Diplopia 06/05/2015  . PCO (posterior capsular opacification) 06/19/2013  . 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 Pseudophakia of left eye 04/10/2013  . Nuclear cataract 01/20/2012  . 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 dvt  1980    Family History  Problem Relation Age of Onset  . Cancer Mother        melanoma  . Stroke Father     Medications- reviewed and updated Current Outpatient Medications  Medication Sig Dispense Refill  . acyclovir (ZOVIRAX) 400 MG tablet TAKE 1 TABLET TWICE DAILY 180 tablet 1  . albuterol (PROVENTIL HFA;VENTOLIN HFA) 108 (90 Base) MCG/ACT inhaler Inhale 2 puffs into the lungs every 6 (six) hours as needed for  wheezing or shortness of breath. 1 Inhaler 5  . amitriptyline (ELAVIL) 50 MG tablet TAKE 1 TABLET AT BEDTIME 90 tablet 1  . amLODipine (NORVASC) 10 MG tablet TAKE 1 TABLET EVERY DAY 90 tablet 3  . Blood Glucose Monitoring Suppl (TRUE METRIX AIR GLUCOSE METER) w/Device KIT     . Cholecalciferol 1000 UNITS capsule Take 1 capsule (1,000 Units total) by mouth 2 (two) times daily.    . clotrimazole-betamethasone (LOTRISONE) cream Apply 1 application topically 2 (two) times daily. For 7 days maximum for severe jock itch . Stop if worsening symptoms. 45  g 1  . famotidine (PEPCID) 20 MG tablet TAKE 1 TABLET TWICE DAILY 180 tablet 1  . finasteride (PROSCAR) 5 MG tablet TAKE 1 TABLET EVERY DAY 90 tablet 1  . gabapentin (NEURONTIN) 300 MG capsule TAKE 1 CAPSULE TWICE DAILY 180 capsule 1  . glucose blood (TRUE METRIX BLOOD GLUCOSE TEST) test strip TEST BLOOD SUGAR EVERY DAY 100 strip 1  . lisinopril (ZESTRIL) 2.5 MG tablet Take 1 tablet (2.5 mg total) by mouth daily. 90 tablet 1  . metFORMIN (GLUCOPHAGE) 1000 MG tablet Take 1 tablet (1,000 mg total) by mouth daily with breakfast. 180 tablet 3  . metFORMIN (GLUCOPHAGE) 500 MG tablet TAKE 1 TABLET EVERY DAY WITH BREAKFAST (Patient taking differently: Take 500 mg by mouth every evening. ) 90 tablet 3  . Multiple Vitamin (MULTIVITAMIN) capsule Take 1 capsule by mouth daily.      . NONFORMULARY OR COMPOUNDED ITEM [facility]  Peripheral Neuropathy Cream- Bupivacaine 1%, Doxepin 3%, Gabapentin 6%, Pentoxifylline 3%, Topiramate 1% Apply 1-2 grams to affected area 3-4 times daily Qty. 120 gm 3 refills    . omeprazole (PRILOSEC) 20 MG capsule TAKE 1 CAPSULE DAILY 30 TO 60 MINUTES BEFORE FIRST MEAL OF THE DAY 90 capsule 1  . rosuvastatin (CRESTOR) 10 MG tablet TAKE 1 TABLET EVERY DAY 90 tablet 3  . tamsulosin (FLOMAX) 0.4 MG CAPS capsule TAKE 1 CAPSULE EVERY DAY 90 capsule 1  . TRUEPLUS LANCETS 28G MISC Use to test blood sugars two times daily. Dx: E11.9 200 each 3  . ASCORBIC ACID PO 1,000 mg every morning.     No current facility-administered medications for this visit.    Allergies-reviewed and updated No Known Allergies  Social History   Social History Narrative   Married 53 years in 2015. 4 kids (2 sons) and 3 grandkids.    Lived in [place], [place] wholel life      Retired from [employer], going to [employer] where they have a camper            Objective  Objective:  BP 122/68   Pulse 72   Temp (!) 97.4 F (36.3 C)   Ht '5\' 9"'  (1.753 m)   Wt 207 lb 3.2 oz (94  kg)   SpO2 95%   BMI 30.60 kg/m  Gen: NAD, resting comfortably HEENT: Mask not removed due to covid 19. TM normal. Bridge of nose normal. Eyelids normal.  Neck: no thyromegaly or cervical lymphadenopathy  CV: RRR no murmurs rubs or gallops Lungs: CTAB no crackles, wheeze, rhonchi Abdomen: soft/nontender/nondistended/normal bowel sounds. No rebound or guarding.  Ext: 1+ bilateral  edema Skin: warm, dry Neuro: grossly normal, moves all extremities, PERRLA-moves slowly to table due to joint pain   Assessment and Plan  79 y.o. male presenting for annual physical.  Health Maintenance counseling: 1. Anticipatory guidance: Patient counseled regarding regular dental  exams - no as has false teeth, eye exams - once yearly,  avoiding smoking and second hand smoke , limiting alcohol to 2 beverages per day - doesn't drink as recovered alcoholic 2. Risk factor reduction:  Advised patient of need for regular exercise and diet rich and fruits and vegetables to reduce risk of heart attack and stroke. Exercise- limited by pain. Diet-up 5 lbs from last visit- encouraged healthy eating as exercise limited. .  Wt Readings from Last 3 Encounters:  11/07/19 207 lb 3.2 oz (94 kg)  02/21/19 202 lb (91.6 kg)  12/21/18 210 lb 8 oz (95.5 kg)  3. Immunizations/screenings/ancillary studies- had first covid shot - and 2nd planned. Due for Tdap- could get at pharmacy or we could give next visit here.  Immunization History  Administered Date(s) Administered  . Influenza Split 09/07/2011, 07/30/2012  . Influenza Whole 10/10/2005, 08/31/2009, 07/10/2010  . Influenza, High Dose Seasonal PF 06/22/2016, 06/29/2017, 09/20/2018, 07/11/2019  . Influenza,inj,Quad PF,6+ Mos 07/01/2013, 06/20/2014  . Influenza-Unspecified 10/26/2015, 09/12/2018  . Meningococcal Conjugate 07/24/2014  . Moderna SARS-COVID-2 Vaccination 10/22/2019  . Pneumococcal Conjugate-13 07/24/2014  . Pneumococcal Polysaccharide-23 08/31/2009  . Td  06/10/2006  . Zoster Recombinat (Shingrix) 12/11/2018, 05/03/2019  4. Prostate cancer screening- no longer a candidate due to age based screening  Lab Results  Component Value Date   PSA 0.11 12/01/2015   PSA 0.48 08/23/2011   PSA 2.15 01/04/2010   5. Colon cancer screening - may 2020 with 18 polyps and 1 year repeat 6. Skin cancer screening- no dermatologist. advised regular sunscreen use. Denies worrisome, changing, or new skin lesions.  7. former smoker- quit 1980s- no regular screening based on this  Status of chronic or acute concerns   #ILD/COPD S: Patient carries a diagnosis of COPD due to former smoking.  Also previously diagnosed as having interstitial lung disease by pulmonology.  He does not follow-up with them anymore.  Fortunately breathing issues have not progressed.  He has albuterol but uses it anywhere from 1-3x a day- stable at present A/P:Stable. Continue current medications.    # Diabetes S: compliant with metformin 1044m in AM and 5031min the evening. Sugars around 150.  Lab Results  Component Value Date   HGBA1C.   poc 5.9 (A) 11/07/2019   A/P: Stable. Continue current medications.   #hypertension/CKD stage III S: compliant with amlodipine 1050mlisinopril 2.5mg60mhronic kidney disease has been stable with GFR in the 50s-several readings in 2019 and 2020 with GFR at 60 or above.  Avoids nsaids- takes tylenol up to 8 a day- advised 6 max A/P: Stable. Continue current medications. Update GFR today  #hyperlipidemia S: compliant with rosuvastatin 10mg26mL goal under 70 A/P: hopefully controlled - update today  # GERD #b12 deficiency history- now on orals S:compliant with omeprazole 20mg 66m Results  Component Value Date   VITAMINB12 1,112 (H) 12/20/2018  A/P: controlled- continue current rx. b12 ok on last check- he is on b12 supplement   # BPH S:compliant with flomax 0.4mg an50minasteride 5mg.  V45m Rare nocturia .  A/P: Stable. Continue current  medications.    # history of ocular herpes S:patient is compliant with acyclovir 400mg BID83ms been on this since 1980- apparently has had flare ups in past when tried to come off. Was in his right eye.  A/P: recently saw eye doctor- reports he is going to send report to me. Apparently has some "bad findings" but he is not sure if  its related to diabetes   #Chronic low back pain/diabetic neuropathy S: Used to be on narcotics but UDS's were negative for narcotics x2 and these were discontinued.  Patient is compliant with amitriptyline and gabapentin-he finds these helpful. A/P: reasonable control despite no narcotics- continue current rx    #Thrombocytopenia S: Follows with oncology- see extensive note December 20, 2018   -They continue to follow blood work A/P: encouraged him to schedule march appointment for follow up    # Depression S: Compliant with Prozac 20 mg in past (in 2021 he reports not taking) and amitriptyline 50 mg A/P: well controlled with phq9 under 5- continue amitriptyline alone since he reports not taking prozac- could restart if needed in future  # Jock itch - can get bad and uses clotrimazone and betamethasone together and finds helpful- asks for refill today.   # diabetic foot ulcer- follows with podiatry for this he reports  Recommended follow up: 6 months follow up Future Appointments  Date Time Provider [place]  12/09/2019  2:30 PM Marzetta Board, DPM [facility]  12/19/2019  2:30 PM [facility] None  12/19/2019  3:00 PM Brunetta Genera, MD [facility] None   Lab/Order associations: fasting   ICD-10-CM   1. Preventative health care  Z00.00   2. Controlled type 2 diabetes mellitus with diabetic polyneuropathy, without long-term current use of insulin (HCC)  E11.42 POCT HgB A1C    CBC with Differential/Platelet    Comprehensive metabolic panel    Lipid panel  3. ILD (interstitial lung disease) ([place])  J84.9   4.  Chronic obstructive pulmonary disease, unspecified COPD type ([place])  J44.9   5. Stage 3 chronic kidney disease, unspecified whether stage 3a or 3b CKD  N18.30   6. Chronic bilateral low back pain with left-sided sciatica  M54.42    G89.29   7. Thrombocytopenia ([place])  D69.6   8. Ocular herpes  B02.30   9. Primary osteoarthritis of both knees  M17.0   10. Hyperlipidemia associated with type 2 diabetes mellitus (HCC)  E11.69    E78.5   11. Fatty liver  K76.0   12. Essential hypertension  I10   13. Diabetic polyneuropathy associated with type 2 diabetes mellitus (HCC)  E11.42   14. B12 deficiency  E53.8 Vitamin B12  15. Recurrent major depressive disorder, in full remission ([place])  F33.42   16. Diabetic ulcer of right midfoot associated with diabetes mellitus due to underlying condition, with fat layer exposed [facility])  Q91.694    L97.412     Meds ordered this encounter  Medications  . DISCONTD: clotrimazole-betamethasone (LOTRISONE) cream    Sig: Apply 1 application topically 2 (two) times daily. For 7 days maximum for severe jock itch . Stop if worsening symptoms.    Dispense:  45 g    Refill:  1  . clotrimazole-betamethasone (LOTRISONE) cream    Sig: Apply 1 application topically 2 (two) times daily. For 7 days maximum for severe jock itch . Stop if worsening symptoms.    Dispense:  45 g    Refill:  1    Return precautions advised.  [name], MD

## 2019-11-07 NOTE — Patient Instructions (Addendum)
Health Maintenance Due  Topic Date Due  . TETANUS/TDAP - get this done at your pharmacy at least 2 weeks after final covid shot 06/10/2016  . OPHTHALMOLOGY EXAM - 10/25/19- team please request a copy of this 01/25/2019  . INFLUENZA VACCINE -07-25-2019 05/11/2019  . HEMOGLOBIN A1C -today- looked good under 7 08/25/2019   Please stop by lab before you go If you do not have mychart- we will call you about results within 5 business days of [place] receiving them.  If you have mychart- we will send your results within 3 business days of [place] receiving them.  If abnormal or we want to clarify a result, we will call or mychart you to make sure you receive the message.  If you have questions or concerns or don't hear within 5-7 days, please send [place] a message or call us.    No changes today- refilled jock itch cream

## 2019-11-29 ENCOUNTER — Other Ambulatory Visit: Payer: Self-pay

## 2019-11-29 ENCOUNTER — Telehealth: Payer: Self-pay | Admitting: Family Medicine

## 2019-11-29 DIAGNOSIS — J449 Chronic obstructive pulmonary disease, unspecified: Secondary | ICD-10-CM

## 2019-11-29 MED ORDER — ALBUTEROL SULFATE HFA 108 (90 BASE) MCG/ACT IN AERS: 2.0000 | INHALATION_SPRAY | Freq: Four times a day (QID) | RESPIRATORY_TRACT | 3 refills | Status: DC | PRN

## 2019-11-29 NOTE — Telephone Encounter (Signed)
..   LAST APPOINTMENT DATE: 11/07/2019   NEXT APPOINTMENT DATE:@Visit  date not found  MEDICATION: Ventolin HFA Albuterol Sulfate 200 Metered Inhalations   PHARMACY: [facility] in [place]   **Let patient know to contact pharmacy at the end of the day to make sure medication is ready. **  ** Please notify patient to allow 48-72 hours to process**  **Encourage patient to contact the pharmacy for refills or they can request refills through [facility]**  [place]:   LAST REFILL:  12/21/16  QTY: 1 w/ 5 refills   REFILL DATE:    OTHER COMMENTS:  Patient states he uses as needed.  States he has ran out of this med.  He did not realize that he had no refills.  States this helps him to sleep better at night.  Is requesting to be sent to the pharmacy today if at all possible.     Okay for refill?  Please advise

## 2019-11-29 NOTE — Telephone Encounter (Signed)
Medication refilled

## 2019-12-02 ENCOUNTER — Telehealth: Payer: Self-pay | Admitting: Family Medicine

## 2019-12-02 NOTE — Telephone Encounter (Signed)
Please schedule pt a virtual or OV to be assessed by [facility].

## 2019-12-02 NOTE — Telephone Encounter (Signed)
Called pt to schedule appt but was unable to LVM

## 2019-12-02 NOTE — Telephone Encounter (Signed)
Pt came in requesting Dr. Yong Channel prescribe him something to help with his breathing. Pt states he is still experiencing shortness of breath. Pt states he had ambulance come to his house last night (12/01/2019) to examine him. Please advise.

## 2019-12-03 ENCOUNTER — Inpatient Hospital Stay (HOSPITAL_COMMUNITY)
Admission: EM | Admit: 2019-12-03 | Discharge: 2019-12-08 | DRG: 291 | Disposition: A | Discharge status: Home Health | Payer: Medicare HMO | Attending: Internal Medicine | Admitting: Internal Medicine

## 2019-12-03 ENCOUNTER — Emergency Department (HOSPITAL_COMMUNITY): Payer: Medicare HMO

## 2019-12-03 ENCOUNTER — Other Ambulatory Visit: Payer: Self-pay

## 2019-12-03 ENCOUNTER — Encounter (HOSPITAL_COMMUNITY): Payer: Self-pay | Admitting: Emergency Medicine

## 2019-12-03 DIAGNOSIS — K7460 Unspecified cirrhosis of liver: Secondary | ICD-10-CM | POA: Diagnosis not present

## 2019-12-03 DIAGNOSIS — E1149 Type 2 diabetes mellitus with other diabetic neurological complication: Secondary | ICD-10-CM | POA: Diagnosis present

## 2019-12-03 DIAGNOSIS — N1832 Chronic kidney disease, stage 3b: Secondary | ICD-10-CM | POA: Diagnosis present

## 2019-12-03 DIAGNOSIS — N1831 Chronic kidney disease, stage 3a: Secondary | ICD-10-CM | POA: Diagnosis present

## 2019-12-03 DIAGNOSIS — N179 Acute kidney failure, unspecified: Secondary | ICD-10-CM | POA: Diagnosis not present

## 2019-12-03 DIAGNOSIS — Z86718 Personal history of other venous thrombosis and embolism: Secondary | ICD-10-CM | POA: Diagnosis not present

## 2019-12-03 DIAGNOSIS — J9620 Acute and chronic respiratory failure, unspecified whether with hypoxia or hypercapnia: Secondary | ICD-10-CM | POA: Diagnosis present

## 2019-12-03 DIAGNOSIS — I5031 Acute diastolic (congestive) heart failure: Secondary | ICD-10-CM | POA: Diagnosis not present

## 2019-12-03 DIAGNOSIS — J849 Interstitial pulmonary disease, unspecified: Secondary | ICD-10-CM

## 2019-12-03 DIAGNOSIS — R188 Other ascites: Secondary | ICD-10-CM

## 2019-12-03 DIAGNOSIS — I5023 Acute on chronic systolic (congestive) heart failure: Secondary | ICD-10-CM

## 2019-12-03 DIAGNOSIS — E1142 Type 2 diabetes mellitus with diabetic polyneuropathy: Secondary | ICD-10-CM | POA: Diagnosis not present

## 2019-12-03 DIAGNOSIS — F329 Major depressive disorder, single episode, unspecified: Secondary | ICD-10-CM | POA: Diagnosis present

## 2019-12-03 DIAGNOSIS — Z87891 Personal history of nicotine dependence: Secondary | ICD-10-CM

## 2019-12-03 DIAGNOSIS — I5043 Acute on chronic combined systolic (congestive) and diastolic (congestive) heart failure: Secondary | ICD-10-CM | POA: Diagnosis present

## 2019-12-03 DIAGNOSIS — E785 Hyperlipidemia, unspecified: Secondary | ICD-10-CM | POA: Diagnosis present

## 2019-12-03 DIAGNOSIS — Z20822 Contact with and (suspected) exposure to covid-19: Secondary | ICD-10-CM | POA: Diagnosis present

## 2019-12-03 DIAGNOSIS — N400 Enlarged prostate without lower urinary tract symptoms: Secondary | ICD-10-CM | POA: Diagnosis present

## 2019-12-03 DIAGNOSIS — R161 Splenomegaly, not elsewhere classified: Secondary | ICD-10-CM | POA: Diagnosis present

## 2019-12-03 DIAGNOSIS — Z79899 Other long term (current) drug therapy: Secondary | ICD-10-CM

## 2019-12-03 DIAGNOSIS — K760 Fatty (change of) liver, not elsewhere classified: Secondary | ICD-10-CM | POA: Diagnosis present

## 2019-12-03 DIAGNOSIS — Z7984 Long term (current) use of oral hypoglycemic drugs: Secondary | ICD-10-CM | POA: Diagnosis not present

## 2019-12-03 DIAGNOSIS — I110 Hypertensive heart disease with heart failure: Secondary | ICD-10-CM | POA: Diagnosis not present

## 2019-12-03 DIAGNOSIS — J441 Chronic obstructive pulmonary disease with (acute) exacerbation: Secondary | ICD-10-CM | POA: Diagnosis not present

## 2019-12-03 DIAGNOSIS — E1122 Type 2 diabetes mellitus with diabetic chronic kidney disease: Secondary | ICD-10-CM | POA: Diagnosis present

## 2019-12-03 DIAGNOSIS — D6959 Other secondary thrombocytopenia: Secondary | ICD-10-CM | POA: Diagnosis present

## 2019-12-03 DIAGNOSIS — I10 Essential (primary) hypertension: Secondary | ICD-10-CM | POA: Diagnosis present

## 2019-12-03 DIAGNOSIS — N183 Chronic kidney disease, stage 3 unspecified: Secondary | ICD-10-CM | POA: Diagnosis not present

## 2019-12-03 DIAGNOSIS — I509 Heart failure, unspecified: Secondary | ICD-10-CM

## 2019-12-03 DIAGNOSIS — K219 Gastro-esophageal reflux disease without esophagitis: Secondary | ICD-10-CM | POA: Diagnosis present

## 2019-12-03 DIAGNOSIS — R0602 Shortness of breath: Secondary | ICD-10-CM | POA: Diagnosis not present

## 2019-12-03 DIAGNOSIS — I5033 Acute on chronic diastolic (congestive) heart failure: Secondary | ICD-10-CM | POA: Diagnosis not present

## 2019-12-03 DIAGNOSIS — J449 Chronic obstructive pulmonary disease, unspecified: Secondary | ICD-10-CM | POA: Diagnosis not present

## 2019-12-03 DIAGNOSIS — I2510 Atherosclerotic heart disease of native coronary artery without angina pectoris: Secondary | ICD-10-CM | POA: Diagnosis not present

## 2019-12-03 DIAGNOSIS — J9621 Acute and chronic respiratory failure with hypoxia: Secondary | ICD-10-CM | POA: Diagnosis not present

## 2019-12-03 DIAGNOSIS — I130 Hypertensive heart and chronic kidney disease with heart failure and stage 1 through stage 4 chronic kidney disease, or unspecified chronic kidney disease: Secondary | ICD-10-CM | POA: Diagnosis not present

## 2019-12-03 DIAGNOSIS — R079 Chest pain, unspecified: Secondary | ICD-10-CM | POA: Diagnosis not present

## 2019-12-03 DIAGNOSIS — E1169 Type 2 diabetes mellitus with other specified complication: Secondary | ICD-10-CM | POA: Diagnosis present

## 2019-12-03 DIAGNOSIS — J9611 Chronic respiratory failure with hypoxia: Secondary | ICD-10-CM

## 2019-12-03 DIAGNOSIS — I5022 Chronic systolic (congestive) heart failure: Secondary | ICD-10-CM

## 2019-12-03 LAB — BASIC METABOLIC PANEL
Anion gap: 12 (ref 5–15)
BUN: 14 mg/dL (ref 8–23)
CO2: 24 mmol/L (ref 22–32)
Calcium: 9.5 mg/dL (ref 8.9–10.3)
Chloride: 108 mmol/L (ref 98–111)
Creatinine, Ser: 1.17 mg/dL (ref 0.61–1.24)
GFR calc Af Amer: >60 mL/min (ref >60)
GFR calc non Af Amer: 59 mL/min — ABNORMAL LOW (ref >60)
Glucose, Bld: 156 mg/dL — ABNORMAL HIGH (ref 70–99)
Potassium: 4.9 mmol/L (ref 3.5–5.1)
Sodium: 144 mmol/L (ref 135–145)

## 2019-12-03 LAB — CBC
HCT: 35.6 % — ABNORMAL LOW (ref 39.0–52.0)
Hemoglobin: 11.5 g/dL — ABNORMAL LOW (ref 13.0–17.0)
MCH: 33.9 pg (ref 26.0–34.0)
MCHC: 32.3 g/dL (ref 30.0–36.0)
MCV: 105 fL — ABNORMAL HIGH (ref 80.0–100.0)
Platelets: 140 10*3/uL — ABNORMAL LOW (ref 150–400)
RBC: 3.39 MIL/uL — ABNORMAL LOW (ref 4.22–5.81)
RDW: 15.1 % (ref 11.5–15.5)
WBC: 7.1 10*3/uL (ref 4.0–10.5)
nRBC: 0 % (ref 0.0–0.2)

## 2019-12-03 LAB — POC SARS CORONAVIRUS 2 AG -  ED: SARS Coronavirus 2 Ag: NEGATIVE

## 2019-12-03 LAB — BRAIN NATRIURETIC PEPTIDE: B Natriuretic Peptide: 927.6 pg/mL — ABNORMAL HIGH (ref 0.0–100.0)

## 2019-12-03 LAB — TROPONIN I (HIGH SENSITIVITY)
Troponin I (High Sensitivity): 21 ng/L — ABNORMAL HIGH (ref <18)
Troponin I (High Sensitivity): 20 ng/L — ABNORMAL HIGH (ref <18)

## 2019-12-03 MED ORDER — IOHEXOL 350 MG/ML SOLN
100.0000 mL | Freq: Once | INTRAVENOUS | Status: AC | PRN
  Administered 2019-12-03: 100 mL via INTRAVENOUS

## 2019-12-03 MED ORDER — AZITHROMYCIN 250 MG PO TABS
500.0000 mg | ORAL_TABLET | Freq: Once | ORAL | Status: AC
  Administered 2019-12-03: 500 mg via ORAL
  Filled 2019-12-03: qty 2

## 2019-12-03 MED ORDER — FUROSEMIDE 10 MG/ML IJ SOLN
20.0000 mg | Freq: Once | INTRAMUSCULAR | Status: AC
  Administered 2019-12-03: 20 mg via INTRAVENOUS
  Filled 2019-12-03: qty 2

## 2019-12-03 MED ORDER — ALBUTEROL SULFATE HFA 108 (90 BASE) MCG/ACT IN AERS
2.0000 | INHALATION_SPRAY | Freq: Once | RESPIRATORY_TRACT | Status: AC
  Administered 2019-12-03: 2 via RESPIRATORY_TRACT
  Filled 2019-12-03: qty 6.7

## 2019-12-03 MED ORDER — SODIUM CHLORIDE 0.9 % IV SOLN
1.0000 g | Freq: Once | INTRAVENOUS | Status: AC
  Administered 2019-12-03: 1 g via INTRAVENOUS
  Filled 2019-12-03: qty 10

## 2019-12-03 NOTE — ED Provider Notes (Signed)
[facility] EMERGENCY DEPARTMENT Provider Note   CSN: [id number] Arrival date & time: 12/03/19  1513     History Chief Complaint  Patient presents with  . Shortness of Breath    [name] is a 79 y.o. male.  The history is provided by the patient and medical records. No language interpreter was used.  Shortness of Breath  [name] is a 79 y.o. male who presents to the Emergency Department complaining of CP and SOB.  He complains of five days of progressive shortness of breath with dyspnea on exertion that is profound in nature. He has associated central chest tightness and pain. Pain is worse when he takes a deep breath. He feels like every time he takes it breath it might be his last. He denies any fevers, abdominal pain, nausea, vomiting, diarrhea. He has chronic lower extremity edema states that this is near his baseline. He does have a history of COPD with similar symptoms in the past that required hospitalization several years ago. 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 No known COVID 19 exposures. He had a second COVID vaccine yesterday.    Past Medical History:  Diagnosis Date  . Benign prostatic hypertrophy   . COPD (chronic obstructive pulmonary disease) ([place]) December 15, 2009   FEV1 2.30 (70%) ratio 63 no better with B2 and DLCO 18/6 (73%) corrects to 106%  . Coughing December 18, 2009   Sinus CT : Minor mucosal thickening at the Right Frontoethmoidal recess, otherwise  negative sinuses  . Degenerative joint disease   . Depression   . Diabetes mellitus type II   . Diverticulosis   . Fatty liver   . Hyperlipidemia   . Hypertension August 12, 2009   try off ACE for upper airway cough  . Internal hemorrhoids   . Low back pain   . Otitis externa 07/30/2012  . Splenomegaly   . Ulcer of foot ([place])    right foot    Patient Active Problem List   Diagnosis Date Noted  . Acute on chronic respiratory failure ([place]) 12/03/2019   . Diabetic ulcer of right midfoot associated with diabetes mellitus due to underlying condition, with fat layer exposed ([place]) 11/07/2019  . Anemia 03/05/2018  . GERD (gastroesophageal reflux disease) 09/30/2017  . 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 positive 03/29/2016  . Thrombocytopenia ([place]) 12/21/2015  . B12 deficiency 08/20/2015  . 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 Diabetic polyneuropathy associated with type 2 diabetes mellitus ([place]) 08/07/2015  . Diplopia 06/05/2015  . CKD (chronic kidney disease), stage III 05/06/2015  . Fatty liver 11/04/2014  . 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 PCO (posterior capsular opacification) 06/19/2013  . 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 Pseudophakia of left eye 04/10/2013  . ILD (interstitial lung disease) ([place]) 07/05/2012  . Nuclear cataract 01/20/2012  . 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 COPD (chronic obstructive pulmonary disease) ([place]) 12/15/2009  . BPH (benign prostatic hyperplasia) 06/20/2007  . Depression 05/02/2007  . Chronic back pain. Off narcotics 06/05/17 due to negative UDS for opiates x2. Full history 06/12/14. Pain contract signed.  05/02/2007  . DM (diabetes mellitus) type II controlled, neurological manifestation ([place]) 04/06/2007  . Hyperlipidemia associated with type 2 diabetes mellitus ([place]) 04/06/2007  . Essential hypertension 04/06/2007  . Osteoarthritis 04/06/2007    Past Surgical History:  Procedure Laterality Date  . dvt  1980       Family History  Problem Relation Age  of Onset  . Cancer Mother        melanoma  . Stroke Father     Social History   Tobacco Use  . Smoking status: Former Smoker    Packs/day: 4.00    Years: 20.00    Pack years: 80.00    Types: Cigarettes    Quit date: 09/18/1984    Years since quitting: 35.2  . Smokeless tobacco: Never Used  . Tobacco comment: quit in 1980  Substance Use Topics  . 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 Drug use: No    Home  Medications Prior to Admission medications   Medication Sig Start Date End Date Taking? Authorizing Provider  acyclovir (ZOVIRAX) 400 MG tablet TAKE 1 TABLET TWICE DAILY Patient taking differently: Take 400 mg by mouth 2 (two) times daily.  09/03/19  Yes [name], MD  albuterol (VENTOLIN HFA) 108 (90 Base) MCG/ACT inhaler Inhale 2 puffs into the lungs every 6 (six) hours as needed for wheezing or shortness of breath. 11/29/19  Yes [name], MD  amitriptyline (ELAVIL) 50 MG tablet TAKE 1 TABLET AT BEDTIME Patient taking differently: Take 50 mg by mouth at bedtime.  08/26/19  Yes [name], MD  amLODipine (NORVASC) 10 MG tablet TAKE 1 TABLET EVERY DAY Patient taking differently: Take 10 mg by mouth daily.  01/15/19  Yes [name], MD  ASCORBIC ACID PO Take 1 tablet by mouth daily.    Yes [provider]  Blood Glucose Monitoring Suppl (TRUE METRIX AIR GLUCOSE METER) w/Device KIT  08/01/18  Yes [provider]  Cholecalciferol 1000 UNITS capsule Take 1 capsule (1,000 Units total) by mouth 2 (two) times daily. 05/19/11  Yes Parrett, Tammy S, NP  clotrimazole-betamethasone (LOTRISONE) cream Apply 1 application topically 2 (two) times daily. For 7 days maximum for severe jock itch . Stop if worsening symptoms. 11/07/19  Yes [name], MD  famotidine (PEPCID) 20 MG tablet TAKE 1 TABLET TWICE DAILY Patient taking differently: Take 20 mg by mouth daily.  08/26/19  Yes [name], MD  finasteride (PROSCAR) 5 MG tablet TAKE 1 TABLET EVERY DAY Patient taking differently: Take 5 mg by mouth daily.  08/26/19  Yes [name], MD  gabapentin (NEURONTIN) 300 MG capsule TAKE 1 CAPSULE TWICE DAILY Patient taking differently: Take 300 mg by mouth 2 (two) times daily.  03/07/19  Yes [name], MD  glucose blood (TRUE METRIX BLOOD GLUCOSE TEST) test strip TEST BLOOD SUGAR EVERY DAY 11/05/19  Yes [name], MD  lisinopril (ZESTRIL) 2.5  MG tablet Take 1 tablet (2.5 mg total) by mouth daily. 04/03/19  Yes [name], MD  metFORMIN (GLUCOPHAGE) 1000 MG tablet Take 1 tablet (1,000 mg total) by mouth daily with breakfast. 01/17/19  Yes [name], MD  metFORMIN (GLUCOPHAGE) 500 MG tablet TAKE 1 TABLET EVERY DAY WITH BREAKFAST Patient taking differently: Take 500 mg by mouth every evening.  10/01/18  Yes [name], MD  Multiple Vitamin (MULTIVITAMIN) capsule Take 1 capsule by mouth daily.     Yes [provider]  NONFORMULARY OR COMPOUNDED ITEM [facility]  Peripheral Neuropathy Cream- Bupivacaine 1%, Doxepin 3%, Gabapentin 6%, Pentoxifylline 3%, Topiramate 1% Apply 1-2 grams to affected area 3-4 times daily Qty. 120 gm 3 refills 02/20/17  Yes [provider]  omeprazole (PRILOSEC) 20 MG capsule TAKE 1 CAPSULE DAILY 30 TO 60  MINUTES BEFORE FIRST MEAL OF THE DAY Patient taking differently: Take 20 mg by mouth daily.  08/26/19  Yes [name], MD  rosuvastatin (CRESTOR) 10 MG tablet TAKE 1 TABLET EVERY DAY Patient taking differently: Take 10 mg by mouth daily.  03/07/19  Yes [name], MD  sodium chloride (MURO 128) 5 % ophthalmic solution Place 1 drop into the left eye at bedtime.   Yes [provider]  tamsulosin (FLOMAX) 0.4 MG CAPS capsule TAKE 1 CAPSULE EVERY DAY Patient taking differently: Take 0.4 mg by mouth daily.  08/26/19  Yes [name], MD  TRUEPLUS LANCETS 28G MISC Use to test blood sugars two times daily. Dx: E11.9 07/30/18  Yes [name], MD    Allergies    Patient has no known allergies.  Review of Systems   Review of Systems  Respiratory: Positive for shortness of breath.   All other systems reviewed and are negative.   Physical Exam Updated Vital Signs BP 134/80 (BP Location: Left Arm)   Pulse (!) 115   Temp 98.4 F (36.9 C) (Oral)   Resp 20   Ht 6' (1.829 m)   Wt 88 kg   SpO2 97%   BMI 26.31 kg/m   Physical  Exam Vitals and nursing note reviewed.  Constitutional:      Appearance: He is well-developed.  HENT:     Head: Normocephalic and atraumatic.  Cardiovascular:     Rate and Rhythm: Regular rhythm. Tachycardia present.     Heart sounds: No murmur.  Pulmonary:     Effort: Pulmonary effort is normal. No respiratory distress.     Breath sounds: Normal breath sounds.  Abdominal:     Palpations: Abdomen is soft.     Tenderness: There is no abdominal tenderness. There is no guarding or rebound.  Musculoskeletal:        General: No tenderness.     Comments: Trace edema to BLE  Skin:    General: Skin is warm and dry.  Neurological:     Mental Status: He is alert and oriented to person, place, and time.  Psychiatric:        Behavior: Behavior normal.     ED Results / Procedures / Treatments   Labs (all labs ordered are listed, but only abnormal results are displayed) Labs Reviewed  BASIC METABOLIC PANEL - Abnormal; Notable for the following components:      Result Value   Glucose, Bld 156 (*)    GFR calc non Af Amer 59 (*)    All other components within normal limits  CBC - Abnormal; Notable for the following components:   RBC 3.39 (*)    Hemoglobin 11.5 (*)    HCT 35.6 (*)    MCV 105.0 (*)    Platelets 140 (*)    All other components within normal limits  BRAIN NATRIURETIC PEPTIDE - Abnormal; Notable for the following components:   B Natriuretic Peptide 927.6 (*)    All other components within normal limits  TROPONIN I (HIGH SENSITIVITY) - Abnormal; Notable for the following components:   Troponin I (High Sensitivity) 21 (*)    All other components within normal limits  TROPONIN I (HIGH SENSITIVITY) - Abnormal; Notable for the following components:   Troponin I (High Sensitivity) 20 (*)    All other components within normal limits  SARS CORONAVIRUS 2 (TAT 6-24 HRS)  POC SARS CORONAVIRUS 2 AG -  ED    EKG EKG Interpretation  Date/Time:  Tuesday December 03 2019  15:17:51 EST Ventricular Rate:  129 PR Interval:  146 QRS Duration: 102 QT Interval:  308 QTC Calculation: 451 R Axis:   59 Text Interpretation: Sinus tachycardia Incomplete right bundle branch block Cannot rule out Anterior infarct , age undetermined Abnormal ECG Confirmed by Quintella Reichert [phone]) on 12/03/2019 4:48:20 PM   Radiology DG Chest 2 View  Result Date: 12/03/2019 CLINICAL DATA:  Shortness of breath EXAM: CHEST - 2 VIEW COMPARISON:  03/24/2014 FINDINGS: Small bilateral pleural effusions. Patchy airspace disease at the left lung base. Stable cardiomediastinal silhouette with aortic atherosclerosis. Subsegmental atelectasis in the left mid lung. No pneumothorax. IMPRESSION: Small bilateral pleural effusions with patchy atelectasis or pneumonia at the left base. Electronically Signed   By: Donavan Foil M.D.   On: 12/03/2019 16:10   CT Angio Chest PE W/Cm &/Or Wo Cm  Result Date: 12/03/2019 CLINICAL DATA:  79 year old male with chest pain and shortness of breath. EXAM: CT ANGIOGRAPHY CHEST WITH CONTRAST TECHNIQUE: Multidetector CT imaging of the chest was performed using the standard protocol during bolus administration of intravenous contrast. Multiplanar CT image reconstructions and MIPs were obtained to evaluate the vascular anatomy. CONTRAST:  139m OMNIPAQUE IOHEXOL 350 MG/ML SOLN COMPARISON:  Chest CT dated 03/31/2014. Chest radiograph dated 12/03/2019. FINDINGS: Cardiovascular: There is mild cardiomegaly. No pericardial effusion. Advanced 3 vessel coronary vascular calcification. There is advanced atherosclerotic calcification of the thoracic aorta. Evaluation of the pulmonary arteries is limited due to respiratory motion artifact. No pulmonary artery embolus identified. Mediastinum/Nodes: There is no hilar or mediastinal adenopathy. The esophagus is grossly unremarkable. No mediastinal fluid collection. Lungs/Pleura: There is moderate bilateral pleural effusions. There is diffuse  interstitial and interlobular septal thickening most consistent with edema. Diffuse bilateral lower lobe predominant hazy densities may represent combination of atelectasis and edema or pneumonia. Clinical correlation is recommended. There is no pneumothorax. The central airways are patent. There is background of emphysema. Upper Abdomen: Irregular liver contour suggestive of cirrhosis. There is mild bilateral renal parenchyma atrophy with cortical scarring and lobulation. The kidneys are partially visualized and not evaluated. A 3 cm soft tissue prominence in the anterior left kidney likely represents lobulated renal tissue and less likely a mass. Further evaluation with ultrasound on a nonemergent/outpatient basis recommended. Musculoskeletal: Osteopenia with degenerative changes of the spine. Age indeterminate fracture of the lateral aspect of the left ninth rib. Clinical correlation is recommended. Review of the MIP images confirms the above findings. IMPRESSION: 1. No CT evidence of pulmonary artery embolus. 2. Moderate bilateral pleural effusions with findings of pulmonary edema. Pneumonia is not excluded clinical correlation is recommended. 3. Mild cardiomegaly, 3 vessel coronary vascular calcification, and Aortic Atherosclerosis (ICD10-I70.0) and Emphysema (ICD10-J43.9). 4. Cirrhosis. 5. Lobulated renal parenchyma versus less likely left renal mass. Further evaluation with ultrasound or CT with IV contrast on a nonemergent/outpatient basis recommended. Electronically Signed   By: AAnner CreteM.D.   On: 12/03/2019 20:18    Procedures Procedures (including critical care time)  Medications Ordered in ED Medications  albuterol (VENTOLIN HFA) 108 (90 Base) MCG/ACT inhaler 2 puff (2 puffs Inhalation Given 12/03/19 1736)  cefTRIAXone (ROCEPHIN) 1 g in sodium chloride 0.9 % 100 mL IVPB (0 g Intravenous Stopped 12/03/19 2105)  azithromycin (ZITHROMAX) tablet 500 mg (500 mg Oral Given 12/03/19 1759)   iohexol (OMNIPAQUE) 350 MG/ML injection 100 mL (100 mLs Intravenous Contrast Given 12/03/19 2003)  furosemide (LASIX) injection 20 mg (20 mg Intravenous Given 12/03/19 2106)  ED Course  I have reviewed the triage vital signs and the nursing notes.  Pertinent labs & imaging results that were available during my care of the patient were reviewed by me and considered in my medical decision making (see chart for details).    MDM Rules/Calculators/A&P                     Patient here for evaluation of progressive shortness of breath. He is significantly tachycardic on evaluation with clear lung sounds. Given his history of DVT a CTA was obtained. CT is negative for PE but is consistent with congestive heart failure with pleural effusions. There is question of pneumonia, will treat with antibiotics. Will treat with Lasix for diuresis for CHF. Patient does occasionally desaturate to the upper 80s and he was placed on supplemental oxygen. Patient and son updated findings of studies and recommendation for admission for new onset CHF. Patient is in agreement with treatment plan. Hospitalist consulted for admission.  Final Clinical Impression(s) / ED Diagnoses Final diagnoses:  Acute congestive heart failure, unspecified heart failure type ([place])  Shortness of breath    Rx / DC Orders ED Discharge Orders    None       Quintella Reichert, MD 12/03/19 2226

## 2019-12-03 NOTE — H&P (Signed)
History and Physical  SMILEY BIRR JJH:[id number] DOB: Oct 31, 1940 DOA: 12/03/2019  Referring physician: ER provider PCP: [name], MD  Outpatient Specialists:    Patient coming from: Home  Chief Complaint: Shortness of breath  HPI:  Patient is a 79 year old Caucasian male with past medical history significant for COPD, chronic respiratory failure previously on oxygen supplementation, hypertension, hyperlipidemia, fatty liver with splenomegaly, diabetes mellitus type 2, diverticulosis, DVT, BPH and degenerative joint disease.  Patient quit cigarette smoking over 20 years ago.  Patient developed shortness of breath 5 days ago.  Shortness of breath has progressively worsened.  EMS was called yesterday, and the patient was advised to come to the hospital for further assessment and management of the shortness of breath but the patient refused as he wanted to take second dose of the Covid vaccine yesterday.  Patient has denied fever or chills.  Patient endorses dyspnea on minimal exertion with orthopnea.  No headache, no neck pain, no chest pain, no GI symptoms and no urinary symptoms.  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  Troponin was 20.  CT angio of the chest was negative for pulmonary artery embolus.  However, CT angio chest revealed moderate bilateral pleural effusion with findings of pulmonary edema, and pneumonia could not be excluded.  Mild cardiomegaly with severe three-vessel coronary vascular calcification and aortic atherosclerosis, liver cirrhosis with lobulated renal parenchyma were reported.  Patient will be admitted for further assessment and management.  ED Course: On presentation to the hospital, vitals revealed temperature of 98.4, blood pressure of 134/80 mmHg, heart rate of 115 to 127 bpm, respiratory rate of 20 and O2 sat of 93 to 97%.  Work-up  is as documented above.  ER provider has administered diuretics and nebulizer treatment.  Patient remains significantly short of breath.  Patient will be admitted to the progressive unit.  Pertinent labs: As documented above.  EKG: Independently reviewed.   Imaging: independently reviewed.   Review of Systems:  Negative for fever, visual changes, sore throat, rash, new muscle aches, chest pain, dysuria, bleeding, n/v/abdominal pain.  Past Medical History:  Diagnosis Date  . Benign prostatic hypertrophy   . COPD (chronic obstructive pulmonary disease) ([place]) December 15, 2009   FEV1 2.30 (70%) ratio 63 no better with B2 and DLCO 18/6 (73%) corrects to 106%  . Coughing December 18, 2009   Sinus CT : Minor mucosal thickening at the Right Frontoethmoidal recess, otherwise  negative sinuses  . Degenerative joint disease   . Depression   . Diabetes mellitus type II   . Diverticulosis   . Fatty liver   . Hyperlipidemia   . Hypertension August 12, 2009   try off ACE for upper airway cough  . Internal hemorrhoids   . Low back pain   . Otitis externa 07/30/2012  . Splenomegaly   . Ulcer of foot ([place])    right foot    Past Surgical History:  Procedure Laterality Date  . dvt  1980     reports that he quit smoking about 35 years ago. His smoking use included cigarettes. He has a 80.00 pack-year smoking history. He has never used smokeless tobacco. He reports that he does not drink alcohol or use drugs.  No Known Allergies  Family History  Problem Relation Age of Onset  . Cancer Mother        melanoma  .  Stroke Father      Prior to Admission medications   Medication Sig Start Date End Date Taking? Authorizing Provider  acyclovir (ZOVIRAX) 400 MG tablet TAKE 1 TABLET TWICE DAILY Patient taking differently: Take 400 mg by mouth 2 (two) times daily.  09/03/19  Yes [name], MD  albuterol (VENTOLIN HFA) 108 (90 Base) MCG/ACT inhaler Inhale 2 puffs into the lungs every 6 (six)  hours as needed for wheezing or shortness of breath. 11/29/19  Yes [name], MD  amitriptyline (ELAVIL) 50 MG tablet TAKE 1 TABLET AT BEDTIME Patient taking differently: Take 50 mg by mouth at bedtime.  08/26/19  Yes [name], MD  amLODipine (NORVASC) 10 MG tablet TAKE 1 TABLET EVERY DAY Patient taking differently: Take 10 mg by mouth daily.  01/15/19  Yes [name], MD  ASCORBIC ACID PO Take 1 tablet by mouth daily.    Yes [provider]  Blood Glucose Monitoring Suppl (TRUE METRIX AIR GLUCOSE METER) w/Device KIT  08/01/18  Yes [provider]  Cholecalciferol 1000 UNITS capsule Take 1 capsule (1,000 Units total) by mouth 2 (two) times daily. 05/19/11  Yes Parrett, Tammy S, NP  clotrimazole-betamethasone (LOTRISONE) cream Apply 1 application topically 2 (two) times daily. For 7 days maximum for severe jock itch . Stop if worsening symptoms. 11/07/19  Yes [name], MD  famotidine (PEPCID) 20 MG tablet TAKE 1 TABLET TWICE DAILY Patient taking differently: Take 20 mg by mouth daily.  08/26/19  Yes [name], MD  finasteride (PROSCAR) 5 MG tablet TAKE 1 TABLET EVERY DAY Patient taking differently: Take 5 mg by mouth daily.  08/26/19  Yes [name], MD  gabapentin (NEURONTIN) 300 MG capsule TAKE 1 CAPSULE TWICE DAILY Patient taking differently: Take 300 mg by mouth 2 (two) times daily.  03/07/19  Yes [name], MD  glucose blood (TRUE METRIX BLOOD GLUCOSE TEST) test strip TEST BLOOD SUGAR EVERY DAY 11/05/19  Yes [name], MD  lisinopril (ZESTRIL) 2.5 MG tablet Take 1 tablet (2.5 mg total) by mouth daily. 04/03/19  Yes [name], MD  metFORMIN (GLUCOPHAGE) 1000 MG tablet Take 1 tablet (1,000 mg total) by mouth daily with breakfast. 01/17/19  Yes [name], MD  metFORMIN (GLUCOPHAGE) 500 MG tablet TAKE 1 TABLET EVERY DAY WITH BREAKFAST Patient taking differently: Take 500 mg by mouth every evening.  10/01/18  Yes  [name], MD  Multiple Vitamin (MULTIVITAMIN) capsule Take 1 capsule by mouth daily.     Yes [provider]  NONFORMULARY OR COMPOUNDED ITEM [facility]  Peripheral Neuropathy Cream- Bupivacaine 1%, Doxepin 3%, Gabapentin 6%, Pentoxifylline 3%, Topiramate 1% Apply 1-2 grams to affected area 3-4 times daily Qty. 120 gm 3 refills 02/20/17  Yes [provider]  omeprazole (PRILOSEC) 20 MG capsule TAKE 1 CAPSULE DAILY 30 TO 60 MINUTES BEFORE FIRST MEAL OF THE DAY Patient taking differently: Take 20 mg by mouth daily.  08/26/19  Yes [name], MD  rosuvastatin (CRESTOR) 10 MG tablet TAKE 1 TABLET EVERY DAY Patient taking differently: Take 10 mg by mouth daily.  03/07/19  Yes [name], MD  sodium chloride (MURO 128) 5 % ophthalmic solution Place 1 drop into the left eye at bedtime.   Yes [provider]  tamsulosin (FLOMAX) 0.4 MG CAPS capsule TAKE 1 CAPSULE EVERY DAY Patient taking differently: Take 0.4 mg by mouth daily.  08/26/19  Yes [name]  O, MD  TRUEPLUS LANCETS 28G MISC Use to test blood sugars two times daily. Dx: E11.9 07/30/18  Yes [name], MD    Physical Exam: Vitals:   12/03/19 1522 12/03/19 1523 12/03/19 1652 12/03/19 1734  BP: (!) 130/104  (!) 141/90 134/80  Pulse: (!) 127  (!) 123 (!) 115  Resp: (!) 26  (!) 24 20  Temp: 97.8 F (36.6 C)  98.6 F (37 C) 98.4 F (36.9 C)  TempSrc: Oral  Oral Oral  SpO2: 93%  97% 97%  Weight:  88 kg    Height:  6' (1.829 m)      Constitutional:  . Patient is in respiratory distress.   Eyes:  . Marland KitchenPallor. No jaundice.  ENMT:  . external ears, nose appear normal Neck:  . Neck is supple.  JVD is equivocal. Respiratory:  . Decreased air entry with minimal expiratory wheeze (patient has received nebulizer treatment) Cardiovascular:  . S1S2 . Bilateral LE extremity edema   Abdomen:  . Abdomen is obese, soft and non tender.  Shifting dullness is equivocal.   Organs are difficult to assess. Neurologic:  . Awake and alert. . Moves all limbs.  Wt Readings from Last 3 Encounters:  12/03/19 88 kg  11/07/19 94 kg  02/21/19 91.6 kg    I have personally reviewed following labs and imaging studies  Labs on Admission:  CBC: Recent Labs  Lab 12/03/19 1538  WBC 7.1  HGB 11.5*  HCT 35.6*  MCV 105.0*  PLT 833*   Basic Metabolic Panel: Recent Labs  Lab 12/03/19 1538  NA 144  K 4.9  CL 108  CO2 24  GLUCOSE 156*  BUN 14  CREATININE 1.17  CALCIUM 9.5   Liver Function Tests: No results for input(s): AST, ALT, ALKPHOS, BILITOT, PROT, ALBUMIN in the last 168 hours. No results for input(s): LIPASE, AMYLASE in the last 168 hours. No results for input(s): AMMONIA in the last 168 hours. Coagulation Profile: No results for input(s): INR, PROTIME in the last 168 hours. Cardiac Enzymes: No results for input(s): CKTOTAL, CKMB, CKMBINDEX, TROPONINI in the last 168 hours. BNP (last 3 results) No results for input(s): PROBNP in the last 8760 hours. HbA1C: No results for input(s): HGBA1C in the last 72 hours. CBG: No results for input(s): GLUCAP in the last 168 hours. Lipid Profile: No results for input(s): CHOL, HDL, LDLCALC, TRIG, CHOLHDL, LDLDIRECT in the last 72 hours. Thyroid Function Tests: No results for input(s): TSH, T4TOTAL, FREET4, T3FREE, THYROIDAB in the last 72 hours. Anemia Panel: No results for input(s): VITAMINB12, FOLATE, FERRITIN, TIBC, IRON, RETICCTPCT in the last 72 hours. Urine analysis:    Component Value Date/Time   COLORURINE YELLOW 10/22/2018 [place] 10/22/2018 1451   LABSPEC 1.020 10/22/2018 1451   PHURINE 5.5 10/22/2018 1451   GLUCOSEU NEGATIVE 10/22/2018 1451   HGBUR NEGATIVE 10/22/2018 1451   HGBUR negative 05/14/2008 1032   BILIRUBINUR NEGATIVE 10/22/2018 1451   BILIRUBINUR neg 05/15/2017 1330   KETONESUR NEGATIVE 10/22/2018 1451   PROTEINUR trace 05/15/2017 1330   UROBILINOGEN 0.2  10/22/2018 1451   NITRITE NEGATIVE 10/22/2018 1451   LEUKOCYTESUR NEGATIVE 10/22/2018 1451   Sepsis Labs: _0 (procalcitonin:4,lacticidven:4) )No results found for this or any previous visit (from the past 240 hour(s)).    Radiological Exams on Admission: DG Chest 2 View  Result Date: 12/03/2019 CLINICAL DATA:  Shortness of breath EXAM: CHEST - 2 VIEW COMPARISON:  03/24/2014 FINDINGS: Small bilateral pleural effusions. Patchy airspace  disease at the left lung base. Stable cardiomediastinal silhouette with aortic atherosclerosis. Subsegmental atelectasis in the left mid lung. No pneumothorax. IMPRESSION: Small bilateral pleural effusions with patchy atelectasis or pneumonia at the left base. Electronically Signed   By: Donavan Foil M.D.   On: 12/03/2019 16:10   CT Angio Chest PE W/Cm &/Or Wo Cm  Result Date: 12/03/2019 CLINICAL DATA:  79 year old male with chest pain and shortness of breath. EXAM: CT ANGIOGRAPHY CHEST WITH CONTRAST TECHNIQUE: Multidetector CT imaging of the chest was performed using the standard protocol during bolus administration of intravenous contrast. Multiplanar CT image reconstructions and MIPs were obtained to evaluate the vascular anatomy. CONTRAST:  144m OMNIPAQUE IOHEXOL 350 MG/ML SOLN COMPARISON:  Chest CT dated 03/31/2014. Chest radiograph dated 12/03/2019. FINDINGS: Cardiovascular: There is mild cardiomegaly. No pericardial effusion. Advanced 3 vessel coronary vascular calcification. There is advanced atherosclerotic calcification of the thoracic aorta. Evaluation of the pulmonary arteries is limited due to respiratory motion artifact. No pulmonary artery embolus identified. Mediastinum/Nodes: There is no hilar or mediastinal adenopathy. The esophagus is grossly unremarkable. No mediastinal fluid collection. Lungs/Pleura: There is moderate bilateral pleural effusions. There is diffuse interstitial and interlobular septal thickening most consistent with edema.  Diffuse bilateral lower lobe predominant hazy densities may represent combination of atelectasis and edema or pneumonia. Clinical correlation is recommended. There is no pneumothorax. The central airways are patent. There is background of emphysema. Upper Abdomen: Irregular liver contour suggestive of cirrhosis. There is mild bilateral renal parenchyma atrophy with cortical scarring and lobulation. The kidneys are partially visualized and not evaluated. A 3 cm soft tissue prominence in the anterior left kidney likely represents lobulated renal tissue and less likely a mass. Further evaluation with ultrasound on a nonemergent/outpatient basis recommended. Musculoskeletal: Osteopenia with degenerative changes of the spine. Age indeterminate fracture of the lateral aspect of the left ninth rib. Clinical correlation is recommended. Review of the MIP images confirms the above findings. IMPRESSION: 1. No CT evidence of pulmonary artery embolus. 2. Moderate bilateral pleural effusions with findings of pulmonary edema. Pneumonia is not excluded clinical correlation is recommended. 3. Mild cardiomegaly, 3 vessel coronary vascular calcification, and Aortic Atherosclerosis (ICD10-I70.0) and Emphysema (ICD10-J43.9). 4. Cirrhosis. 5. Lobulated renal parenchyma versus less likely left renal mass. Further evaluation with ultrasound or CT with IV contrast on a nonemergent/outpatient basis recommended. Electronically Signed   By: AAnner CreteM.D.   On: 12/03/2019 20:18    EKG: Independently reviewed.   Active Problems:   Acute on chronic respiratory failure (HCC)   Assessment/Plan Acute on chronic respiratory failure: -Possibly secondary to COPD with exacerbation -Possibly secondary to acute on chronic diastolic CHF. -Patient also has pleural effusion. -Admit patient to progressive unit. -ABG. -Low threshold to introduce BiPAP. -IV Lasix 40 Mg twice daily. -IV Solu-Medrol, nebs Xopenex -Further management  depend on hospital course -Low threshold to consult pulmonary and/or cardiology teams.  Possible acute on chronic diastolic CHF: -Diuresis as documented above. -Metoprolol. -Echocardiogram. -Further management depend on hospital course.  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   -Cannot rule out myelodysplastic syndrome  Bilateral pleural effusion: -Consider thoracentesis and pleural fluid analysis if possible (kindly discussed with radiology team in the morning, probably, after  results of abdominal ultrasound is known).  Advanced three-vessel coronary vascular calcification: -Consider cardiology consultation in the morning.  Guarded prognosis.  There is possibility of further deterioration.  We will admit patient to progressive unit.  Patient is severely ill.  DVT prophylaxis: Subcutaneous Lovenox Code Status: Full code Family Communication:  Disposition Plan: Admit to progressive unit Consults called: Consider pulmonary and cardiac consultation in the morning Admission status: Inpatient  Time spent: 70 minutes  Dana Allan, MD  [facility] Pager #: [phone] 7PM-7AM contact night coverage as above  12/03/2019, 10:01 PM

## 2019-12-03 NOTE — ED Triage Notes (Addendum)
Pt arrives to ED from home with complaints of shortness of breath since Thursday that has been worsening since. Patient denies chest pain, fevers or chills. Patient tachycardic and tachypnea in triage. Patient has hx of COPD and does not use O2 at home.

## 2019-12-03 NOTE — ED Notes (Signed)
Pt demanding to get out of bed to go to the bathroom, pt was advised by this RN and Dr. Ralene Bathe he could not get out of bed due to his his hypoxia    Pt got out of bed and walked to bathroom in his room   Pt very short of breath, placed back on 02 and instructed not to get out of bed.

## 2019-12-03 NOTE — ED Notes (Signed)
[phone] pts son Legrand Como would like an update

## 2019-12-04 ENCOUNTER — Inpatient Hospital Stay (HOSPITAL_COMMUNITY): Payer: Medicare HMO

## 2019-12-04 DIAGNOSIS — E785 Hyperlipidemia, unspecified: Secondary | ICD-10-CM

## 2019-12-04 DIAGNOSIS — I10 Essential (primary) hypertension: Secondary | ICD-10-CM

## 2019-12-04 DIAGNOSIS — I5033 Acute on chronic diastolic (congestive) heart failure: Secondary | ICD-10-CM | POA: Insufficient documentation

## 2019-12-04 DIAGNOSIS — I5031 Acute diastolic (congestive) heart failure: Secondary | ICD-10-CM

## 2019-12-04 DIAGNOSIS — N183 Chronic kidney disease, stage 3 unspecified: Secondary | ICD-10-CM

## 2019-12-04 DIAGNOSIS — J449 Chronic obstructive pulmonary disease, unspecified: Secondary | ICD-10-CM

## 2019-12-04 DIAGNOSIS — E1169 Type 2 diabetes mellitus with other specified complication: Secondary | ICD-10-CM

## 2019-12-04 LAB — MAGNESIUM: Magnesium: 1.3 mg/dL — ABNORMAL LOW (ref 1.7–2.4)

## 2019-12-04 LAB — HEPATIC FUNCTION PANEL
ALT: 15 U/L (ref 0–44)
AST: 27 U/L (ref 15–41)
Albumin: 3.4 g/dL — ABNORMAL LOW (ref 3.5–5.0)
Alkaline Phosphatase: 80 U/L (ref 38–126)
Bilirubin, Direct: 0.2 mg/dL (ref 0.0–0.2)
Indirect Bilirubin: 0.8 mg/dL (ref 0.3–0.9)
Total Bilirubin: 1 mg/dL (ref 0.3–1.2)
Total Protein: 6.6 g/dL (ref 6.5–8.1)

## 2019-12-04 LAB — CBC
HCT: 34.9 % — ABNORMAL LOW (ref 39.0–52.0)
Hemoglobin: 11.6 g/dL — ABNORMAL LOW (ref 13.0–17.0)
MCH: 33.8 pg (ref 26.0–34.0)
MCHC: 33.2 g/dL (ref 30.0–36.0)
MCV: 101.7 fL — ABNORMAL HIGH (ref 80.0–100.0)
Platelets: 157 10*3/uL (ref 150–400)
RBC: 3.43 MIL/uL — ABNORMAL LOW (ref 4.22–5.81)
RDW: 15 % (ref 11.5–15.5)
WBC: 8.6 10*3/uL (ref 4.0–10.5)
nRBC: 0 % (ref 0.0–0.2)

## 2019-12-04 LAB — GLUCOSE, CAPILLARY
Glucose-Capillary: 157 mg/dL — ABNORMAL HIGH (ref 70–99)
Glucose-Capillary: 263 mg/dL — ABNORMAL HIGH (ref 70–99)
Glucose-Capillary: 186 mg/dL — ABNORMAL HIGH (ref 70–99)
Glucose-Capillary: 239 mg/dL — ABNORMAL HIGH (ref 70–99)

## 2019-12-04 LAB — BLOOD GAS, ARTERIAL
Acid-base deficit: 1.7 mmol/L (ref 0.0–2.0)
Bicarbonate: 22.5 mmol/L (ref 20.0–28.0)
Drawn by: 57060
FIO2: 32
O2 Saturation: 91.1 %
Patient temperature: 37.1
pCO2 arterial: 37.7 mmHg (ref 32.0–48.0)
pH, Arterial: 7.393 (ref 7.350–7.450)
pO2, Arterial: 63.7 mmHg — ABNORMAL LOW (ref 83.0–108.0)

## 2019-12-04 LAB — COMPREHENSIVE METABOLIC PANEL
ALT: 15 U/L (ref 0–44)
AST: 26 U/L (ref 15–41)
Albumin: 3.4 g/dL — ABNORMAL LOW (ref 3.5–5.0)
Alkaline Phosphatase: 79 U/L (ref 38–126)
Anion gap: 12 (ref 5–15)
BUN: 14 mg/dL (ref 8–23)
CO2: 23 mmol/L (ref 22–32)
Calcium: 9.1 mg/dL (ref 8.9–10.3)
Chloride: 108 mmol/L (ref 98–111)
Creatinine, Ser: 1.3 mg/dL — ABNORMAL HIGH (ref 0.61–1.24)
GFR calc Af Amer: >60 mL/min (ref >60)
GFR calc non Af Amer: 52 mL/min — ABNORMAL LOW (ref >60)
Glucose, Bld: 143 mg/dL — ABNORMAL HIGH (ref 70–99)
Potassium: 4.2 mmol/L (ref 3.5–5.1)
Sodium: 143 mmol/L (ref 135–145)
Total Bilirubin: 0.9 mg/dL (ref 0.3–1.2)
Total Protein: 6.3 g/dL — ABNORMAL LOW (ref 6.5–8.1)

## 2019-12-04 LAB — ECHOCARDIOGRAM COMPLETE
Height: 72 in
Weight: 3104 oz

## 2019-12-04 LAB — MRSA PCR SCREENING: MRSA by PCR: NEGATIVE

## 2019-12-04 LAB — PROTIME-INR
INR: 1.1 (ref 0.8–1.2)
Prothrombin Time: 14.1 seconds (ref 11.4–15.2)

## 2019-12-04 LAB — PHOSPHORUS: Phosphorus: 4.8 mg/dL — ABNORMAL HIGH (ref 2.5–4.6)

## 2019-12-04 LAB — TSH: TSH: 4.225 u[IU]/mL (ref 0.350–4.500)

## 2019-12-04 LAB — SARS CORONAVIRUS 2 (TAT 6-24 HRS): SARS Coronavirus 2: NEGATIVE

## 2019-12-04 MED ORDER — BUDESONIDE 0.25 MG/2ML IN SUSP
0.2500 mg | Freq: Two times a day (BID) | RESPIRATORY_TRACT | Status: DC
  Administered 2019-12-04 – 2019-12-08 (×9): 0.25 mg via RESPIRATORY_TRACT
  Filled 2019-12-04 (×9): qty 2

## 2019-12-04 MED ORDER — AMLODIPINE BESYLATE 10 MG PO TABS
10.0000 mg | ORAL_TABLET | Freq: Every day | ORAL | Status: DC
  Administered 2019-12-04: 10 mg via ORAL
  Filled 2019-12-04: qty 1

## 2019-12-04 MED ORDER — LEVALBUTEROL HCL 0.63 MG/3ML IN NEBU
0.6300 mg | INHALATION_SOLUTION | Freq: Three times a day (TID) | RESPIRATORY_TRACT | Status: DC
  Administered 2019-12-04: 0.63 mg via RESPIRATORY_TRACT
  Filled 2019-12-04: qty 3

## 2019-12-04 MED ORDER — VITAMIN D 25 MCG (1000 UNIT) PO TABS
1000.0000 [IU] | ORAL_TABLET | Freq: Two times a day (BID) | ORAL | Status: DC
  Administered 2019-12-04 – 2019-12-08 (×10): 1000 [IU] via ORAL
  Filled 2019-12-04 (×10): qty 1

## 2019-12-04 MED ORDER — ASCORBIC ACID 500 MG PO TABS
500.0000 mg | ORAL_TABLET | Freq: Every day | ORAL | Status: DC
  Administered 2019-12-04 – 2019-12-08 (×5): 500 mg via ORAL
  Filled 2019-12-04 (×5): qty 1

## 2019-12-04 MED ORDER — ENOXAPARIN SODIUM 40 MG/0.4ML ~~LOC~~ SOLN
40.0000 mg | SUBCUTANEOUS | Status: DC
  Administered 2019-12-04 – 2019-12-07 (×4): 40 mg via SUBCUTANEOUS
  Filled 2019-12-04 (×4): qty 0.4

## 2019-12-04 MED ORDER — METOPROLOL TARTRATE 25 MG PO TABS
25.0000 mg | ORAL_TABLET | Freq: Three times a day (TID) | ORAL | Status: DC
  Administered 2019-12-04 – 2019-12-05 (×4): 25 mg via ORAL
  Filled 2019-12-04 (×4): qty 1

## 2019-12-04 MED ORDER — FAMOTIDINE 20 MG PO TABS
20.0000 mg | ORAL_TABLET | Freq: Every day | ORAL | Status: DC
  Administered 2019-12-04 – 2019-12-08 (×5): 20 mg via ORAL
  Filled 2019-12-04 (×5): qty 1

## 2019-12-04 MED ORDER — GABAPENTIN 300 MG PO CAPS
300.0000 mg | ORAL_CAPSULE | Freq: Two times a day (BID) | ORAL | Status: DC
  Administered 2019-12-04 – 2019-12-08 (×10): 300 mg via ORAL
  Filled 2019-12-04 (×11): qty 3

## 2019-12-04 MED ORDER — INSULIN ASPART 100 UNIT/ML ~~LOC~~ SOLN
0.0000 [IU] | Freq: Three times a day (TID) | SUBCUTANEOUS | Status: DC
  Administered 2019-12-04 (×2): 2 [IU] via SUBCUTANEOUS
  Administered 2019-12-04: 5 [IU] via SUBCUTANEOUS
  Administered 2019-12-05: 1 [IU] via SUBCUTANEOUS
  Administered 2019-12-05: 2 [IU] via SUBCUTANEOUS
  Administered 2019-12-05: 1 [IU] via SUBCUTANEOUS
  Administered 2019-12-06 (×2): 2 [IU] via SUBCUTANEOUS
  Administered 2019-12-07: 1 [IU] via SUBCUTANEOUS
  Administered 2019-12-07 – 2019-12-08 (×2): 2 [IU] via SUBCUTANEOUS
  Administered 2019-12-08: 1 [IU] via SUBCUTANEOUS

## 2019-12-04 MED ORDER — FUROSEMIDE 10 MG/ML IJ SOLN
40.0000 mg | Freq: Two times a day (BID) | INTRAMUSCULAR | Status: DC
  Administered 2019-12-04 – 2019-12-05 (×4): 40 mg via INTRAVENOUS
  Filled 2019-12-04 (×3): qty 4

## 2019-12-04 MED ORDER — ADULT MULTIVITAMIN W/MINERALS CH
1.0000 | ORAL_TABLET | Freq: Every day | ORAL | Status: DC
  Administered 2019-12-04 – 2019-12-08 (×5): 1 via ORAL
  Filled 2019-12-04 (×5): qty 1

## 2019-12-04 MED ORDER — ACYCLOVIR 400 MG PO TABS
400.0000 mg | ORAL_TABLET | Freq: Two times a day (BID) | ORAL | Status: DC
  Administered 2019-12-04 – 2019-12-08 (×10): 400 mg via ORAL
  Filled 2019-12-04 (×10): qty 1

## 2019-12-04 MED ORDER — ROSUVASTATIN CALCIUM 5 MG PO TABS
10.0000 mg | ORAL_TABLET | Freq: Every day | ORAL | Status: DC
  Administered 2019-12-04 – 2019-12-08 (×5): 10 mg via ORAL
  Filled 2019-12-04 (×5): qty 2

## 2019-12-04 MED ORDER — INSULIN ASPART 100 UNIT/ML ~~LOC~~ SOLN
0.0000 [IU] | Freq: Every day | SUBCUTANEOUS | Status: DC
  Administered 2019-12-04: 2 [IU] via SUBCUTANEOUS

## 2019-12-04 MED ORDER — METHYLPREDNISOLONE SODIUM SUCC 40 MG IJ SOLR
40.0000 mg | Freq: Two times a day (BID) | INTRAMUSCULAR | Status: DC
  Administered 2019-12-04 (×2): 40 mg via INTRAVENOUS
  Filled 2019-12-04 (×2): qty 1

## 2019-12-04 MED ORDER — LISINOPRIL 5 MG PO TABS
2.5000 mg | ORAL_TABLET | Freq: Every day | ORAL | Status: DC
  Administered 2019-12-04 – 2019-12-06 (×3): 2.5 mg via ORAL
  Filled 2019-12-04 (×3): qty 1

## 2019-12-04 MED ORDER — TAMSULOSIN HCL 0.4 MG PO CAPS
0.4000 mg | ORAL_CAPSULE | Freq: Every day | ORAL | Status: DC
  Administered 2019-12-04 – 2019-12-08 (×5): 0.4 mg via ORAL
  Filled 2019-12-04 (×5): qty 1

## 2019-12-04 MED ORDER — LEVALBUTEROL HCL 0.63 MG/3ML IN NEBU
0.6300 mg | INHALATION_SOLUTION | Freq: Four times a day (QID) | RESPIRATORY_TRACT | Status: DC | PRN
  Administered 2019-12-04: 0.63 mg via RESPIRATORY_TRACT
  Filled 2019-12-04: qty 3

## 2019-12-04 MED ORDER — IPRATROPIUM BROMIDE 0.02 % IN SOLN
0.5000 mg | Freq: Three times a day (TID) | RESPIRATORY_TRACT | Status: DC
  Administered 2019-12-04 – 2019-12-05 (×3): 0.5 mg via RESPIRATORY_TRACT
  Filled 2019-12-04 (×3): qty 2.5

## 2019-12-04 MED ORDER — FINASTERIDE 5 MG PO TABS
5.0000 mg | ORAL_TABLET | Freq: Every day | ORAL | Status: DC
  Administered 2019-12-04 – 2019-12-08 (×5): 5 mg via ORAL
  Filled 2019-12-04 (×5): qty 1

## 2019-12-04 MED ORDER — PANTOPRAZOLE SODIUM 40 MG PO TBEC
40.0000 mg | DELAYED_RELEASE_TABLET | Freq: Every day | ORAL | Status: DC
  Administered 2019-12-04 – 2019-12-08 (×5): 40 mg via ORAL
  Filled 2019-12-04 (×5): qty 1

## 2019-12-04 MED ORDER — AMITRIPTYLINE HCL 50 MG PO TABS
50.0000 mg | ORAL_TABLET | Freq: Every day | ORAL | Status: DC
  Administered 2019-12-04 – 2019-12-07 (×4): 50 mg via ORAL
  Filled 2019-12-04 (×4): qty 1

## 2019-12-04 MED ORDER — CEFDINIR 300 MG PO CAPS
300.0000 mg | ORAL_CAPSULE | Freq: Two times a day (BID) | ORAL | Status: DC
  Administered 2019-12-04 (×2): 300 mg via ORAL
  Filled 2019-12-04 (×2): qty 1

## 2019-12-04 MED ORDER — SODIUM CHLORIDE (HYPERTONIC) 5 % OP SOLN
1.0000 [drp] | Freq: Every day | OPHTHALMIC | Status: DC
  Administered 2019-12-04 – 2019-12-07 (×4): 1 [drp] via OPHTHALMIC
  Filled 2019-12-04: qty 15

## 2019-12-04 MED ORDER — IPRATROPIUM BROMIDE 0.02 % IN SOLN
0.5000 mg | Freq: Three times a day (TID) | RESPIRATORY_TRACT | Status: DC
  Administered 2019-12-04: 0.5 mg via RESPIRATORY_TRACT
  Filled 2019-12-04: qty 2.5

## 2019-12-04 MED ORDER — LEVALBUTEROL HCL 0.63 MG/3ML IN NEBU
0.6300 mg | INHALATION_SOLUTION | Freq: Three times a day (TID) | RESPIRATORY_TRACT | Status: DC
  Administered 2019-12-04 – 2019-12-05 (×3): 0.63 mg via RESPIRATORY_TRACT
  Filled 2019-12-04 (×3): qty 3

## 2019-12-04 MED ORDER — PERFLUTREN LIPID MICROSPHERE
1.0000 mL | INTRAVENOUS | Status: AC | PRN
  Administered 2019-12-04: 3 mL via INTRAVENOUS
  Filled 2019-12-04: qty 10

## 2019-12-04 NOTE — Evaluation (Signed)
Physical Therapy Evaluation Patient Details Name: [name] MRN: [id number] DOB: 1941/07/12 Today's Date: 12/04/2019   History of Present Illness  Pt is 79 year old Caucasian male with past medical history significant for COPD, chronic respiratory failure previously on oxygen supplementation, hypertension, hyperlipidemia, fatty liver with splenomegaly, diabetes mellitus type 2, diverticulosis, DVT, BPH and degenerative joint disease.  Pt admitted with respiratory falure and CHF  Clinical Impression  Pt admitted with above diagnosis. Pt was on 4 LPM O2 with sats stable during PT.  He was able to ambulate 45' x3 with seated rest breaks.  Required cues for safety with trnasfers.   Pt currently with functional limitations due to the deficits listed below (see PT Problem List). Pt will benefit from skilled PT to increase their independence and safety with mobility to allow discharge to the venue listed below.       Follow Up Recommendations Home health PT    Equipment Recommendations  None recommended by PT    Recommendations for Other Services       Precautions / Restrictions Precautions Precautions: Fall      Mobility  Bed Mobility Overal bed mobility: Needs Assistance             General bed mobility comments: in chair at arrival  Transfers Overall transfer level: Needs assistance Equipment used: Rolling walker (2 wheeled) Transfers: Sit to/from Stand Sit to Stand: Min assist;Min guard         General transfer comment: performed x 7; cues for safe hand placement; initial sit to stand with min A for steadying but improved to min guard; pt was able to perform toileting ADLs  Ambulation/Gait Ambulation/Gait assistance: Min guard Gait Distance (Feet): 45 Feet(x3) Assistive device: Rolling walker (2 wheeled);None Gait Pattern/deviations: Decreased stride length;Shuffle Gait velocity: decreased   General Gait Details: Did first 2 bouts with RW and 3rd bout without  AD; mild unsteadiness but no LOB; only able to ambulate in room due to telemetry not portable; required rest breaks  Stairs            Wheelchair Mobility    Modified Rankin (Stroke Patients Only)       Balance Overall balance assessment: Needs assistance Sitting-balance support: No upper extremity supported;Feet supported Sitting balance-Leahy Scale: Good     Standing balance support: Single extremity supported;During functional activity                                 Pertinent Vitals/Pain Pain Assessment: 0-10 Pain Score: 7  Pain Location: back and knees - chronic Pain Descriptors / Indicators: Sore Pain Intervention(s): Limited activity within patient's tolerance    Home Living Family/patient expects to be discharged to:: Private residence Living Arrangements: Spouse/significant other Available Help at Discharge: Family;Available 24 hours/day Type of Home: House Home Access: Ramped entrance     Home Layout: One level Home Equipment: Grab bars - tub/shower;Shower seat;Cane - single point;Walker - 2 wheels;Cane - quad      Prior Function Level of Independence: Needs assistance   Gait / Transfers Assistance Needed: Pt uses SPC during the day and quad cane at night. Can ambulate at store holding cart.  ADL's / Homemaking Assistance Needed: Can do ADLs and IADLs.  Drives  Comments: denies recent falls     Hand Dominance        Extremity/Trunk Assessment   Upper Extremity Assessment Upper Extremity Assessment: Overall WFL for tasks assessed  Lower Extremity Assessment Lower Extremity Assessment: Overall WFL for tasks assessed    Cervical / Trunk Assessment Cervical / Trunk Assessment: Normal  Communication   Communication: No difficulties  Cognition Arousal/Alertness: Awake/alert Behavior During Therapy: WFL for tasks assessed/performed Overall Cognitive Status: Within Functional Limits for tasks assessed                                         General Comments General comments (skin integrity, edema, etc.): Pt on 4 LPM O2 with sats 95-97% during therapy.  HR 90-103 bpm .    Exercises     Assessment/Plan    PT Assessment Patient needs continued PT services  PT Problem List Decreased strength;Decreased mobility;Decreased activity tolerance;Cardiopulmonary status limiting activity;Decreased balance;Decreased knowledge of use of DME       PT Treatment Interventions DME instruction;Therapeutic activities;Gait training;Therapeutic exercise;Patient/family education;Balance training;Functional mobility training    PT Goals (Current goals can be found in the Care Plan section)  Acute Rehab PT Goals Patient Stated Goal: return home PT Goal Formulation: With patient Time For Goal Achievement: 12/19/19 Potential to Achieve Goals: Good    Frequency Min 3X/week   Barriers to discharge        Co-evaluation               AM-PAC PT "6 Clicks" Mobility  Outcome Measure Help needed turning from your back to your side while in a flat bed without using bedrails?: None Help needed moving from lying on your back to sitting on the side of a flat bed without using bedrails?: None Help needed moving to and from a bed to a chair (including a wheelchair)?: A Little Help needed standing up from a chair using your arms (e.g., wheelchair or bedside chair)?: A Little Help needed to walk in hospital room?: A Little Help needed climbing 3-5 steps with a railing? : A Little 6 Click Score: 20    End of Session Equipment Utilized During Treatment: Gait belt Activity Tolerance: Patient tolerated treatment well Patient left: in chair;with call bell/phone within reach(feet elevated) Nurse Communication: Mobility status PT Visit Diagnosis: Difficulty in walking, not elsewhere classified (R26.2)    Time: 1610-1650 PT Time Calculation (min) (ACUTE ONLY): 40 min   Charges:   PT Evaluation $PT Eval Moderate  Complexity: 1 Mod PT Treatments $Therapeutic Activity: 8-22 mins        Maggie Font, PT Acute Rehab Services Pager [phone] [facility] [phone] [facility] [place] 12/04/2019, 5:13 PM

## 2019-12-04 NOTE — Progress Notes (Signed)
Patient ID: [name], male   DOB: 09/02/1941, 79 y.o.   MRN: [id number] Echocardiogram with definity completed.  Darlina Sicilian RDCS

## 2019-12-04 NOTE — Plan of Care (Signed)
  Problem: Clinical Measurements: Goal: Ability to maintain clinical measurements within normal limits will improve Outcome: Progressing   Problem: Activity: Goal: Risk for activity intolerance will decrease Outcome: Progressing   Problem: Elimination: Goal: Will not experience complications related to bowel motility Outcome: Progressing Goal: Will not experience complications related to urinary retention Outcome: Progressing   Problem: Pain Managment: Goal: General experience of comfort will improve Outcome: Progressing   Pt able to stand and ambulate to chair without desaturating. Less dyspnea on exertion. No pain during shift. Strict I/O -- condom cath in place. Good UOP.

## 2019-12-04 NOTE — Progress Notes (Addendum)
PROGRESS NOTE    [name]  NLZ:[id number] DOB: [birth year] DOA: 12/03/2019 PCP: [name], MD    Brief Narrative:  Patient has been admitted to the hospital with a working diagnosis of acute on chronic diastolic heart failure exacerbation, complicated by bilateral pleural effusions and cardiogenic pulmonary edema.   79 year old male who presented with dyspnea.  He does have significant past medical history for COPD with chronic hypoxic respiratory failure, hypertension, dyslipidemia, type 2 diabetes mellitus, deep vein thrombosis and BPH.  Reported 5 days of dyspnea, progressive in nature, worse with minimal exertion.  On his initial physical examination his temperature was 98.4, blood pressure 134/80, heart rate 115 227 bpm, respiratory 20, oxygen saturation 93%.  Lungs had decreased entry bilaterally, heart S1-S2 present, rhythmic, abdomen protuberant, positive lower extremity edema bilaterally. Sodium 144, potassium 4.9, chloride 108, bicarb 24, glucose 156, BUN 14, creatinine 1.17, BNP 927, troponin I 21, white count 7.1, hemoglobin 11.5, hematocrit 35.6, platelets 140.  SARS COVID-19 negative.  Chest radiograph with bilateral hilar congestion and small bilateral pleural effusions.  CT chest with bilateral pleural effusions negative for pulmonary embolism.  EKG 129 bpm, normal axis, normal intervals, sinus rhythm, no ST segment T wave changes, poor R wave progression.  Patient has been placed on diuresis with improvement of his symptoms.   Assessment & Plan:   Principal Problem:   Acute on chronic diastolic CHF (congestive heart failure) (HCC) Active Problems:   DM (diabetes mellitus) type II controlled, neurological manifestation (HCC)   Hyperlipidemia associated with type 2 diabetes mellitus (HCC)   Essential hypertension   COPD (chronic obstructive pulmonary disease) (HCC)   CKD (chronic kidney disease), stage III   Acute on chronic respiratory failure ([place])   1. Acute  hypoxic respiratory failure due to cardiogenic pulmonary edema with bilateral pleural effusions, due to acute on chronic diastolic heart failure. Patient's symptoms have improved with diuresis, bit not yet back to baseline. Urine out put since admission not documented. Oxymetry is 95% on 4 L./ min per [place].  Will continue diuresis with furosemide 40 mg IV q 12, add strict in and out's. Will continue oxymetry monitoring and supplemental oxygen per [place] to keel 02 saturation more than 88%.   Follow on echocardiogram. Continue b blockade and ace inhibition.   Patient continue at high risk for worsening respiratory failure.   2. Chronic COPD. No clinical signs of exacerbation, will continue bronchodilator and supplemental 02 per [place] to target 02 saturation more than 88%. Will discontinue systemic steroids. Continue with budesonide.   3. CAD. No active chest pain.   4. Liver cirrhosis with splenomegaly. Will continue heart failure management. US liver with no ascites. AST 27 and ALT 15.   5. HTN. Continue blood pressure control with amlodipine, lisinopril and metoprolol.   6. T2Dm with dyslipidemia. Continue insulin sliding scale for glucose cover and monitoring, continue rosuvastatin.   DVT prophylaxis: enoxaparin   Code Status:  full Family Communication: I spoke over the phone with the patient's daughter about patient's  condition, plan of care, prognosis and all questions were addressed.  Disposition Plan/ discharge barriers: transfer to telemetry/     Subjective: Patient feeling better, with improvement of dyspnea, but not yet back to baseline, no nausea or vomiting, no chest pain. No lower extremity edema, at home with no cough. Sudden worsening symptoms.   Objective: Vitals:   12/04/19 0447 12/04/19 0716 12/04/19 0721 12/04/19 0744  BP: (!) 135/94  127/83   Pulse: Marland Kitchen)  118  (!) 102 (!) 103  Resp: 20  (!) 26 (!) 22  Temp: 98.2 F (36.8 C)  97.9 F (36.6 C)   TempSrc: Oral  Oral     SpO2: 94% 93% 97% 92%  Weight:      Height:        Intake/Output Summary (Last 24 hours) at 12/04/2019 0941 Last data filed at 12/04/2019 0920 Gross per 24 hour  Intake 240 ml  Output 700 ml  Net -460 ml   Filed Weights   12/03/19 1523  Weight: 88 kg    Examination:   General: Not in pain or dyspnea, deconditioned  Neurology: Awake and alert, non focal  E ENT: mild pallor, no icterus, oral mucosa moist Cardiovascular: No JVD. S1-S2 present, rhythmic, no gallops, rubs, or murmurs. No lower extremity edema. Pulmonary: positive breath sounds bilaterally, decreased air movement, no wheezing, or rhonchi, scattered rales. Gastrointestinal. Abdomen with no organomegaly, non tender, no rebound or guarding Skin. No rashes Musculoskeletal: no joint deformities     Data Reviewed: I have personally reviewed following labs and imaging studies  CBC: Recent Labs  Lab 12/03/19 1538 12/04/19 0312  WBC 7.1 8.6  HGB 11.5* 11.6*  HCT 35.6* 34.9*  MCV 105.0* 101.7*  PLT 140* 324   Basic Metabolic Panel: Recent Labs  Lab 12/03/19 1538 12/04/19 0312  NA 144 143  K 4.9 4.2  CL 108 108  CO2 24 23  GLUCOSE 156* 143*  BUN 14 14  CREATININE 1.17 1.30*  CALCIUM 9.5 9.1  MG  --  1.3*  PHOS  --  4.8*   GFR: Estimated Creatinine Clearance: 51.4 mL/min (A) (by C-G formula based on SCr of 1.3 mg/dL (H)). Liver Function Tests: Recent Labs  Lab 12/04/19 0312  AST 27  26  ALT 15  15  ALKPHOS 80  79  BILITOT 1.0  0.9  PROT 6.6  6.3*  ALBUMIN 3.4*  3.4*   No results for input(s): LIPASE, AMYLASE in the last 168 hours. No results for input(s): AMMONIA in the last 168 hours. Coagulation Profile: Recent Labs  Lab 12/04/19 0312  INR 1.1   Cardiac Enzymes: No results for input(s): CKTOTAL, CKMB, CKMBINDEX, TROPONINI in the last 168 hours. BNP (last 3 results) No results for input(s): PROBNP in the last 8760 hours. HbA1C: No results for input(s): HGBA1C in the last 72  hours. CBG: Recent Labs  Lab 12/04/19 0808  GLUCAP 157*   Lipid Profile: No results for input(s): CHOL, HDL, LDLCALC, TRIG, CHOLHDL, LDLDIRECT in the last 72 hours. Thyroid Function Tests: Recent Labs    12/04/19 0312  TSH 4.225   Anemia Panel: No results for input(s): VITAMINB12, FOLATE, FERRITIN, TIBC, IRON, RETICCTPCT in the last 72 hours.    Radiology Studies: I have reviewed all of the imaging during this hospital visit personally     Scheduled Meds: . acyclovir  400 mg Oral BID  . amitriptyline  50 mg Oral QHS  . amLODipine  10 mg Oral Daily  . ascorbic acid  500 mg Oral Daily  . budesonide (PULMICORT) nebulizer solution  0.25 mg Nebulization BID  . cefdinir  300 mg Oral Q12H  . cholecalciferol  1,000 Units Oral BID  . enoxaparin (LOVENOX) injection  40 mg Subcutaneous Q24H  . famotidine  20 mg Oral Daily  . finasteride  5 mg Oral Daily  . furosemide  40 mg Intravenous BID  . gabapentin  300 mg Oral BID  . insulin aspart  0-5 Units Subcutaneous QHS  . insulin aspart  0-9 Units Subcutaneous TID WC  . ipratropium  0.5 mg Nebulization TID  . levalbuterol  0.63 mg Nebulization TID  . lisinopril  2.5 mg Oral Daily  . methylPREDNISolone (SOLU-MEDROL) injection  40 mg Intravenous Q12H  . metoprolol tartrate  25 mg Oral Q8H  . multivitamin with minerals  1 tablet Oral Daily  . pantoprazole  40 mg Oral Daily  . rosuvastatin  10 mg Oral Daily  . sodium chloride  1 drop Left Eye QHS  . tamsulosin  0.4 mg Oral Daily   Continuous Infusions:   LOS: 1 day        Travus Oren Gerome Apley, MD

## 2019-12-04 NOTE — Plan of Care (Signed)
  Problem: Education: Goal: Knowledge of General Education information will improve Description: Including pain rating scale, medication(s)/side effects and non-pharmacologic comfort measures 12/04/2019 0628 by Izell Springer, RN Outcome: Progressing 12/04/2019 0625 by Izell [place], RN Outcome: Progressing   Problem: Health Behavior/Discharge Planning: Goal: Ability to manage health-related needs will improve 12/04/2019 0628 by Delcie Roch, Kyana Aicher, RN Outcome: Progressing 12/04/2019 0625 by Izell [place], RN Outcome: Progressing   Problem: Clinical Measurements: Goal: Ability to maintain clinical measurements within normal limits will improve 12/04/2019 0628 by Delcie Roch, Maclain Cohron, RN Outcome: Progressing 12/04/2019 0625 by Izell [place], RN Outcome: Progressing Goal: Will remain free from infection 12/04/2019 0628 by Delcie Roch, Eda Magnussen, RN Outcome: Progressing 12/04/2019 0625 by Izell [place], RN Outcome: Progressing Goal: Diagnostic test results will improve 12/04/2019 0628 by Delcie Roch, Etherine Mackowiak, RN Outcome: Progressing 12/04/2019 0625 by Izell [place], RN Outcome: Progressing Goal: Respiratory complications will improve 12/04/2019 0628 by Izell [place], RN Outcome: Progressing 12/04/2019 0625 by Izell [place], RN Outcome: Progressing Goal: Cardiovascular complication will be avoided 12/04/2019 9826 by Izell [place], RN Outcome: Progressing 12/04/2019 0625 by Izell [place], RN Outcome: Progressing   Problem: Activity: Goal: Risk for activity intolerance will decrease 12/04/2019 0628 by Izell [place], RN Outcome: Progressing 12/04/2019 0625 by Izell [place], RN Outcome: Progressing   Problem: Nutrition: Goal: Adequate nutrition will be maintained 12/04/2019 0628 by Delcie Roch, Torian Quintero, RN Outcome: Progressing 12/04/2019 0625 by Izell [place], RN Outcome: Progressing   Problem: Coping: Goal: Level of anxiety will decrease 12/04/2019 0628 by Izell [place], RN Outcome: Progressing 12/04/2019 0625 by  Izell [place], RN Outcome: Progressing   Problem: Elimination: Goal: Will not experience complications related to bowel motility 12/04/2019 0628 by Izell [place], RN Outcome: Progressing 12/04/2019 0625 by Izell [place], RN Outcome: Progressing Goal: Will not experience complications related to urinary retention 12/04/2019 0628 by Delcie Roch, Lashaunta Sicard, RN Outcome: Progressing 12/04/2019 0625 by Izell [place], RN Outcome: Progressing   Problem: Pain Managment: Goal: General experience of comfort will improve 12/04/2019 0628 by Delcie Roch, Ayauna Mcnay, RN Outcome: Progressing 12/04/2019 0625 by Izell [place], RN Outcome: Progressing   Problem: Safety: Goal: Ability to remain free from injury will improve 12/04/2019 0628 by Delcie Roch, Yoneko [place], RN Outcome: Progressing 12/04/2019 0625 by Izell [place], RN Outcome: Progressing   Problem: Skin Integrity: Goal: Risk for impaired skin integrity will decrease 12/04/2019 0628 by Izell , RN Outcome: Progressing 12/04/2019 0625 by Izell , RN Outcome: Progressing

## 2019-12-04 NOTE — Plan of Care (Signed)
Pt remains on 3-4 LNC. [place] of abd completed, Echo completed. Spoke to daughter [name] with updates. Pt VSS . External male catheter in place, call bell within reach of pt, encouraged to use call bell for assistance

## 2019-12-05 DIAGNOSIS — N179 Acute kidney failure, unspecified: Secondary | ICD-10-CM

## 2019-12-05 DIAGNOSIS — E1142 Type 2 diabetes mellitus with diabetic polyneuropathy: Secondary | ICD-10-CM

## 2019-12-05 DIAGNOSIS — I5022 Chronic systolic (congestive) heart failure: Secondary | ICD-10-CM

## 2019-12-05 DIAGNOSIS — I5023 Acute on chronic systolic (congestive) heart failure: Secondary | ICD-10-CM

## 2019-12-05 LAB — GLUCOSE, CAPILLARY
Glucose-Capillary: 135 mg/dL — ABNORMAL HIGH (ref 70–99)
Glucose-Capillary: 198 mg/dL — ABNORMAL HIGH (ref 70–99)
Glucose-Capillary: 122 mg/dL — ABNORMAL HIGH (ref 70–99)
Glucose-Capillary: 125 mg/dL — ABNORMAL HIGH (ref 70–99)

## 2019-12-05 LAB — BASIC METABOLIC PANEL
Anion gap: 10 (ref 5–15)
BUN: 29 mg/dL — ABNORMAL HIGH (ref 8–23)
CO2: 26 mmol/L (ref 22–32)
Calcium: 8.8 mg/dL — ABNORMAL LOW (ref 8.9–10.3)
Chloride: 103 mmol/L (ref 98–111)
Creatinine, Ser: 1.76 mg/dL — ABNORMAL HIGH (ref 0.61–1.24)
GFR calc Af Amer: 42 mL/min — ABNORMAL LOW (ref >60)
GFR calc non Af Amer: 36 mL/min — ABNORMAL LOW (ref >60)
Glucose, Bld: 130 mg/dL — ABNORMAL HIGH (ref 70–99)
Potassium: 4.7 mmol/L (ref 3.5–5.1)
Sodium: 139 mmol/L (ref 135–145)

## 2019-12-05 MED ORDER — CARVEDILOL 12.5 MG PO TABS: 12.5000 mg | ORAL_TABLET | Freq: Two times a day (BID) | ORAL | Status: DC

## 2019-12-05 MED ORDER — ACETAMINOPHEN 325 MG PO TABS
650.0000 mg | ORAL_TABLET | Freq: Four times a day (QID) | ORAL | Status: DC | PRN
  Administered 2019-12-05: 650 mg via ORAL

## 2019-12-05 MED ORDER — CARVEDILOL 3.125 MG PO TABS
3.1250 mg | ORAL_TABLET | Freq: Two times a day (BID) | ORAL | Status: DC
  Administered 2019-12-06 – 2019-12-08 (×5): 3.125 mg via ORAL
  Filled 2019-12-05 (×5): qty 1

## 2019-12-05 NOTE — Progress Notes (Addendum)
PROGRESS NOTE    [name]  AQT:[id number] DOB: 26-Jun-1941 DOA: 12/03/2019 PCP: [name], MD    Brief Narrative:  Patient has been admitted to the hospital with a working diagnosis of acute on chronic diastolic heart failure exacerbation, complicated by bilateral pleural effusions and cardiogenic pulmonary edema.   79 year old male who presented with dyspnea.  He does have significant past medical history for COPD with chronic hypoxic respiratory failure, hypertension, dyslipidemia, type 2 diabetes mellitus, deep vein thrombosis and BPH.  Reported 5 days of dyspnea, progressive in nature, worse with minimal exertion.  On his initial physical examination his temperature was 98.4, blood pressure 134/80, heart rate 115 227 bpm, respiratory 20, oxygen saturation 93%.  Lungs had decreased entry bilaterally, heart S1-S2 present, rhythmic, abdomen protuberant, positive lower extremity edema bilaterally. Sodium 144, potassium 4.9, chloride 108, bicarb 24, glucose 156, BUN 14, creatinine 1.17, BNP 927, troponin I 21, white count 7.1, hemoglobin 11.5, hematocrit 35.6, platelets 140.  SARS COVID-19 negative.  Chest radiograph with bilateral hilar congestion and small bilateral pleural effusions.  CT chest with bilateral pleural effusions negative for pulmonary embolism.  EKG 129 bpm, normal axis, normal intervals, sinus rhythm, no ST segment T wave changes, poor R wave progression.  Patient has been placed on diuresis with improvement of his symptoms.   Further workup with echocardiography showed significant reduction in LV systolic function, down to 20 to 25% , no significant valvular disease, global hypokinesis of the left ventricle.    Assessment & Plan:   Principal Problem:   Acute on chronic systolic CHF (congestive heart failure) (HCC) Active Problems:   DM (diabetes mellitus) type II controlled, neurological manifestation (HCC)   Hyperlipidemia associated with type 2 diabetes  mellitus (HCC)   Essential hypertension   COPD (chronic obstructive pulmonary disease) (HCC)   CKD (chronic kidney disease), stage III   Acute on chronic respiratory failure ([place])   1. Acute hypoxic respiratory failure due to cardiogenic pulmonary edema with bilateral pleural effusions, due to acute on chronic systolic heart failure. Echocardiogram with global left ventricle hypokinesis, with reduced systolic function down to 20 to 25%. Urine output over last 24 is 1,800 ml with significant improvement of his symptoms. Blood pressure systolic 562 mmHg. Oxymetry is 97% on 4 L/ min per [place].   Will continue b blockade with carvedilol and will continue ace inh with low dose lisinopril. Will hold on furosemide due to worsening renal function.   When more stable, patient candidate for aldosterone blockade. Hold on ischemia work up due to low GFR.   Out of bed to chair, PT. OT  2. AKI on CKD stage 3a. Renal function with serum cr up to 1,76 from 1,30. Urine output 1,800 over last 24 h. Will hold on furosemide for now, and will follow up on renal function in am, avoid hypotension and nephrotoxic medications, will continue low dose lisinopril for now.   2. Chronic COPD. No clinical signs of exacerbation, continue supplemental 02 per [place], continue with budesonide.   3. CAD. Patient denies any chest pain or angina.   4. Liver cirrhosis with splenomegaly.  US liver with no ascites. 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  5. HTN. Will dc amlodipine, will continue b blockade and ace inh for heart failure.   6. T2Dm with dyslipidemia. Fasting glucose this am is 103, will continue with insulin sliding scale for glucose cover and monitoring, continue rosuvastatin.   7. Depression. Continue with amitriptyline.  DVT prophylaxis: enoxaparin   Code Status:  full Family Communication: no family at the bedside. Disposition Plan/ discharge barriers: patient continue acutely  ill.     Subjective: Patient is feeling better, dyspnea is improving along with mobility, no nausea or vomiting, no chest pain. This am is out of bed to chair.   Objective: Vitals:   12/05/19 0630 12/05/19 0719 12/05/19 0814 12/05/19 0816  BP:   103/66   Pulse: 95  86   Resp: 18 17 17    Temp:   98.7 F (37.1 C)   TempSrc:   Oral   SpO2: 93% 92% 93% 94%  Weight:      Height:        Intake/Output Summary (Last 24 hours) at 12/05/2019 0857 Last data filed at 12/05/2019 2585 Gross per 24 hour  Intake 840 ml  Output 1100 ml  Net -260 ml   Filed Weights   12/03/19 1523  Weight: 88 kg    Examination:   General: deconditioned  Neurology: Awake and alert, non focal  E ENT: no pallor, no icterus, oral mucosa moist Cardiovascular: No JVD. S1-S2 present, rhythmic, no gallops, rubs, or murmurs. No lower extremity edema. Pulmonary: positive breath sounds bilaterally, no wheezing, or rhonchi scattered rales bilaterally. Gastrointestinal. Abdomen with no organomegaly, non tender, no rebound or guarding Skin. No rashes Musculoskeletal: no joint deformities     Data Reviewed: I have personally reviewed following labs and imaging studies  CBC: Recent Labs  Lab 12/03/19 1538 12/04/19 0312  WBC 7.1 8.6  HGB 11.5* 11.6*  HCT 35.6* 34.9*  MCV 105.0* 101.7*  PLT 140* 277   Basic Metabolic Panel: Recent Labs  Lab 12/03/19 1538 12/04/19 0312 12/05/19 0307  NA 144 143 139  K 4.9 4.2 4.7  CL 108 108 103  CO2 24 23 26   GLUCOSE 156* 143* 130*  BUN 14 14 29*  CREATININE 1.17 1.30* 1.76*  CALCIUM 9.5 9.1 8.8*  MG  --  1.3*  --   PHOS  --  4.8*  --    GFR: Estimated Creatinine Clearance: 38 mL/min (A) (by C-G formula based on SCr of 1.76 mg/dL (H)). Liver Function Tests: Recent Labs  Lab 12/04/19 0312  AST 27  26  ALT 15  15  ALKPHOS 80  79  BILITOT 1.0  0.9  PROT 6.6  6.3*  ALBUMIN 3.4*  3.4*   No results for input(s): LIPASE, AMYLASE in the last 168  hours. No results for input(s): AMMONIA in the last 168 hours. Coagulation Profile: Recent Labs  Lab 12/04/19 0312  INR 1.1   Cardiac Enzymes: No results for input(s): CKTOTAL, CKMB, CKMBINDEX, TROPONINI in the last 168 hours. BNP (last 3 results) No results for input(s): PROBNP in the last 8760 hours. HbA1C: No results for input(s): HGBA1C in the last 72 hours. CBG: Recent Labs  Lab 12/04/19 0808 12/04/19 1153 12/04/19 1528 12/04/19 2023 12/05/19 0730  GLUCAP 157* 263* 186* 239* 135*   Lipid Profile: No results for input(s): CHOL, HDL, LDLCALC, TRIG, CHOLHDL, LDLDIRECT in the last 72 hours. Thyroid Function Tests: Recent Labs    12/04/19 0312  TSH 4.225   Anemia Panel: No results for input(s): VITAMINB12, FOLATE, FERRITIN, TIBC, IRON, RETICCTPCT in the last 72 hours.    Radiology Studies: I have reviewed all of the imaging during this hospital visit personally     Scheduled Meds: . acyclovir  400 mg Oral BID  . amitriptyline  50 mg Oral QHS  .  amLODipine  10 mg Oral Daily  . ascorbic acid  500 mg Oral Daily  . budesonide (PULMICORT) nebulizer solution  0.25 mg Nebulization BID  . cholecalciferol  1,000 Units Oral BID  . enoxaparin (LOVENOX) injection  40 mg Subcutaneous Q24H  . famotidine  20 mg Oral Daily  . finasteride  5 mg Oral Daily  . furosemide  40 mg Intravenous BID  . gabapentin  300 mg Oral BID  . insulin aspart  0-5 Units Subcutaneous QHS  . insulin aspart  0-9 Units Subcutaneous TID WC  . lisinopril  2.5 mg Oral Daily  . metoprolol tartrate  25 mg Oral Q8H  . multivitamin with minerals  1 tablet Oral Daily  . pantoprazole  40 mg Oral Daily  . rosuvastatin  10 mg Oral Daily  . sodium chloride  1 drop Left Eye QHS  . tamsulosin  0.4 mg Oral Daily   Continuous Infusions:   LOS: 2 days        Myley Bahner Gerome Apley, MD

## 2019-12-05 NOTE — Plan of Care (Signed)
  Problem: Clinical Measurements: Goal: Ability to maintain clinical measurements within normal limits will improve Outcome: Progressing Goal: Respiratory complications will improve Outcome: Progressing Goal: Cardiovascular complication will be avoided Outcome: Progressing   Problem: Activity: Goal: Risk for activity intolerance will decrease Outcome: Progressing   Problem: Coping: Goal: Level of anxiety will decrease Outcome: Progressing   Problem: Pain Managment: Goal: General experience of comfort will improve Outcome: Progressing   Problem: Skin Integrity: Goal: Risk for impaired skin integrity will decrease Outcome: Progressing

## 2019-12-05 NOTE — Plan of Care (Signed)

## 2019-12-05 NOTE — Evaluation (Signed)
Occupational Therapy Evaluation Patient Details Name: [name] MRN: [id number] DOB: 10/04/1941 Today's Date: 12/05/2019    History of Present Illness Pt is 79 year old Caucasian male with past medical history significant for COPD, chronic respiratory failure previously on oxygen supplementation, hypertension, hyperlipidemia, fatty liver with splenomegaly, diabetes mellitus type 2, diverticulosis, DVT, BPH and degenerative joint disease.  Pt admitted with respiratory falure and CHF   Clinical Impression   Pt with decline in function and safety with ADLs and ADL mobility with impaired balance and endurance. Pt lives at home with his wife, was independent with ADLs/selfcare (some assist with buttons due to arthritis in hands), used a cane for mobility and was driving. Pt currently requires min A with LB ADLs, min A with toileting, min guard A for grooming at sink and min - min guard A with mobility using RW. Pt would benefit from acute OT services to address impairments to maximize level of function and safety    Follow Up Recommendations  No OT follow up;Supervision - Intermittent    Equipment Recommendations  Other (comment)(reacher)    Recommendations for Other Services       Precautions / Restrictions Precautions Precautions: Fall Restrictions Weight Bearing Restrictions: No      Mobility Bed Mobility               General bed mobility comments: in chair upon arrival  Transfers Overall transfer level: Needs assistance Equipment used: Rolling walker (2 wheeled) Transfers: Sit to/from Stand Sit to Stand: Min assist;Min guard         General transfer comment: cues for correct hand placement    Balance Overall balance assessment: Needs assistance Sitting-balance support: No upper extremity supported;Feet supported Sitting balance-Leahy Scale: Good     Standing balance support: Single extremity supported;During functional activity                                ADL either performed or assessed with clinical judgement   ADL Overall ADL's : Needs assistance/impaired Eating/Feeding: Independent;Sitting   Grooming: Wash/dry hands;Wash/dry face;Min guard;Standing   Upper Body Bathing: Set up;Sitting   Lower Body Bathing: Minimal assistance;Sit to/from stand   Upper Body Dressing : Set up;Sitting   Lower Body Dressing: Minimal assistance;Sit to/from stand   Toilet Transfer: Minimal assistance;Min guard;Ambulation;RW;Cueing for safety   Toileting- Clothing Manipulation and Hygiene: Minimal assistance;Sit to/from stand       Functional mobility during ADLs: Minimal assistance;Min guard;Rolling walker;Cueing for safety       Vision Baseline Vision/History: Wears glasses Wears Glasses: At all times Patient Visual Report: No change from baseline       Perception     Praxis      Pertinent Vitals/Pain Pain Assessment: 0-10 Pain Score: 5  Pain Location: back and knees - chronic Pain Descriptors / Indicators: Sore;Aching Pain Intervention(s): Monitored during session;Repositioned     Hand Dominance Right   Extremity/Trunk Assessment Upper Extremity Assessment Upper Extremity Assessment: Overall WFL for tasks assessed   Lower Extremity Assessment Lower Extremity Assessment: Defer to PT evaluation   Cervical / Trunk Assessment Cervical / Trunk Assessment: Normal   Communication Communication Communication: No difficulties   Cognition Arousal/Alertness: Awake/alert Behavior During Therapy: WFL for tasks assessed/performed Overall Cognitive Status: Within Functional Limits for tasks assessed  General Comments       Exercises     Shoulder Instructions      Home Living Family/patient expects to be discharged to:: Private residence Living Arrangements: Spouse/significant other Available Help at Discharge: Family;Available 24 hours/day Type of Home:  House Home Access: Ramped entrance     Home Layout: One level     Bathroom Shower/Tub: Teacher, early years/pre: Handicapped height     Home Equipment: Grab bars - tub/shower;Shower seat;Cane - single point;Walker - 2 wheels;Cane - quad          Prior Functioning/Environment Level of Independence: Needs assistance  Gait / Transfers Assistance Needed: Pt uses SPC during the day and quad cane outside. Can ambulate at store holding cart. ADL's / Homemaking Assistance Needed: independent with ADLs/selfcare, was driving            OT Problem List: Impaired balance (sitting and/or standing);Decreased activity tolerance;Cardiopulmonary status limiting activity;Decreased knowledge of use of DME or AE      OT Treatment/Interventions: Self-care/ADL training;DME and/or AE instruction;Therapeutic activities;Therapeutic exercise;Patient/family education;Energy conservation    OT Goals(Current goals can be found in the care plan section) Acute Rehab OT Goals Patient Stated Goal: return home OT Goal Formulation: With patient Time For Goal Achievement: 12/19/19 Potential to Achieve Goals: Good ADL Goals Pt Will Perform Grooming: with supervision;with set-up;standing;with caregiver independent in assisting Pt Will Perform Lower Body Bathing: with min guard assist;with caregiver independent in assisting Pt Will Perform Lower Body Dressing: with min guard assist;with caregiver independent in assisting Pt Will Transfer to Toilet: with min guard assist;with supervision;ambulating;grab bars Pt Will Perform Toileting - Clothing Manipulation and hygiene: with supervision;sit to/from stand  OT Frequency: Min 2X/week   Barriers to D/C:            Co-evaluation              AM-PAC OT "6 Clicks" Daily Activity     Outcome Measure Help from another person eating meals?: None Help from another person taking care of personal grooming?: A Little Help from another person  toileting, which includes using toliet, bedpan, or urinal?: A Little Help from another person bathing (including washing, rinsing, drying)?: A Little Help from another person to put on and taking off regular upper body clothing?: None Help from another person to put on and taking off regular lower body clothing?: A Little 6 Click Score: 20   End of Session Equipment Utilized During Treatment: Gait belt;Rolling walker;Oxygen  Activity Tolerance: Patient tolerated treatment well Patient left: in chair;with call bell/phone within reach  OT Visit Diagnosis: Unsteadiness on feet (R26.81);Other abnormalities of gait and mobility (R26.89);Muscle weakness (generalized) (M62.81)                Time: 0350-0938 OT Time Calculation (min): 26 min Charges:  OT General Charges $OT Visit: 1 Visit OT Treatments $Self Care/Home Management : 8-22 mins    Britt Bottom 12/05/2019, 12:21 PM

## 2019-12-06 LAB — BASIC METABOLIC PANEL
Anion gap: 11 (ref 5–15)
BUN: 44 mg/dL — ABNORMAL HIGH (ref 8–23)
CO2: 24 mmol/L (ref 22–32)
Calcium: 8.5 mg/dL — ABNORMAL LOW (ref 8.9–10.3)
Chloride: 101 mmol/L (ref 98–111)
Creatinine, Ser: 2.1 mg/dL — ABNORMAL HIGH (ref 0.61–1.24)
GFR calc Af Amer: 34 mL/min — ABNORMAL LOW (ref >60)
GFR calc non Af Amer: 29 mL/min — ABNORMAL LOW (ref >60)
Glucose, Bld: 106 mg/dL — ABNORMAL HIGH (ref 70–99)
Potassium: 4.2 mmol/L (ref 3.5–5.1)
Sodium: 136 mmol/L (ref 135–145)

## 2019-12-06 LAB — GLUCOSE, CAPILLARY
Glucose-Capillary: 110 mg/dL — ABNORMAL HIGH (ref 70–99)
Glucose-Capillary: 179 mg/dL — ABNORMAL HIGH (ref 70–99)
Glucose-Capillary: 159 mg/dL — ABNORMAL HIGH (ref 70–99)
Glucose-Capillary: 145 mg/dL — ABNORMAL HIGH (ref 70–99)

## 2019-12-06 NOTE — Progress Notes (Signed)
Occupational Therapy Treatment Patient Details Name: [name] MRN: [id number] DOB: 1941-07-03 Today's Date: 12/06/2019    History of present illness Pt is 79 year old Caucasian male with past medical history significant for COPD, chronic respiratory failure previously on oxygen supplementation, hypertension, hyperlipidemia, fatty liver with splenomegaly, diabetes mellitus type 2, diverticulosis, DVT, BPH and degenerative joint disease.  Pt admitted with respiratory falure and CHF   OT comments  Pt. Seen for skilled OT treatment.  Agreeable to oob.  Able to complete bed mobility S.  Don and adjust socks MIn a.  Stand pivot to recliner with cues for hand placement during pivot.    Follow Up Recommendations  No OT follow up;Supervision - Intermittent    Equipment Recommendations       Recommendations for Other Services      Precautions / Restrictions Precautions Precautions: Fall       Mobility Bed Mobility Overal bed mobility: Needs Assistance Bed Mobility: Supine to Sit     Supine to sit: Supervision        Transfers Overall transfer level: Needs assistance Equipment used: 1 person hand held assist Transfers: Sit to/from Stand;Stand Pivot Transfers Sit to Stand: Min guard Stand pivot transfers: Min guard       General transfer comment: cues for correct hand placement    Balance                                           ADL either performed or assessed with clinical judgement   ADL Overall ADL's : Needs assistance/impaired                     Lower Body Dressing: Minimal assistance;Sit to/from stand;Sitting/lateral leans   Toilet Transfer: Minimal assistance;Min guard;Ambulation;Cueing for safety;Cueing for sequencing   Toileting- Clothing Manipulation and Hygiene: Minimal assistance;Sit to/from stand Toileting - Clothing Manipulation Details (indicate cue type and reason): simulated during functional task     Functional  mobility during ADLs: Min guard General ADL Comments: pt. completed bed mobility with S.  able to perform LB dressing min a, stand pivot from bed to recliner.  cues for hand placement on arm rests     Vision       Perception     Praxis      Cognition Arousal/Alertness: Awake/alert Behavior During Therapy: WFL for tasks assessed/performed Overall Cognitive Status: Within Functional Limits for tasks assessed                                          Exercises     Shoulder Instructions       General Comments      Pertinent Vitals/ Pain       Pain Assessment: No/denies pain  Home Living                                          Prior Functioning/Environment              Frequency  Min 2X/week        Progress Toward Goals  OT Goals(current goals can now be found in the care plan section)  Progress towards OT goals: Progressing toward  goals     Plan Discharge plan remains appropriate    Co-evaluation                 AM-PAC OT "6 Clicks" Daily Activity     Outcome Measure   Help from another person eating meals?: None Help from another person taking care of personal grooming?: A Little Help from another person toileting, which includes using toliet, bedpan, or urinal?: A Little Help from another person bathing (including washing, rinsing, drying)?: A Little Help from another person to put on and taking off regular upper body clothing?: None Help from another person to put on and taking off regular lower body clothing?: A Little 6 Click Score: 20    End of Session Equipment Utilized During Treatment: Oxygen  OT Visit Diagnosis: Unsteadiness on feet (R26.81);Other abnormalities of gait and mobility (R26.89);Muscle weakness (generalized) (M62.81)   Activity Tolerance Patient tolerated treatment well   Patient Left in chair;with call bell/phone within reach   Nurse Communication          Time: 9201-0071 OT  Time Calculation (min): 10 min  Charges: OT General Charges $OT Visit: 1 Visit OT Treatments $Self Care/Home Management : 8-22 mins  Sonia Baller, COTA/L Acute Rehabilitation [phone]   Janice Coffin 12/06/2019, 12:37 PM

## 2019-12-06 NOTE — Progress Notes (Signed)
Physical Therapy Treatment Patient Details Name: [name] MRN: [id number] DOB: 07-21-1941 Today's Date: 12/06/2019    History of Present Illness Pt is a 79 y.o. male admitted 12/03/19 with respiratory failure; worked up for CHF exacerbation, complicated by bilateral pleural effusions and cardiogenic pulmonary edema. Also with worsening renal function. Echo showed significant reduction in LV systolic function (61-60%), global hypokinesis L ventricle. PMH includes COPD, HTN, DM2, DVT, BPH, DJD, chronic respiratory failure (previously on O2 supplementation.   PT Comments    Pt progressing well with mobility. Trialled gait training with SPC and RW; stability much improved with RW, although pt still requires cues for safety with mobility. DOE up to 3/4 with activity; SpO2 91-100% on RA. Pt motivated to participate and regain PLOF. Wife present and supportive.   Follow Up Recommendations  Home health PT;Supervision for mobility/OOB     Equipment Recommendations  None recommended by PT    Recommendations for Other Services       Precautions / Restrictions Precautions Precautions: Fall Restrictions Weight Bearing Restrictions: No    Mobility  Bed Mobility               General bed mobility comments: Received sitting in recliner  Transfers Overall transfer level: Needs assistance Equipment used: None;1 person hand held assist;Rolling walker (2 wheeled) Transfers: Sit to/from Stand Sit to Stand: Supervision         General transfer comment: Able to perform 6x repeated sit<>stands with BUE support at supervision-level. Performed standing trials with SPC and RW, cues for hand placement; supervision-level  Ambulation/Gait Ambulation/Gait assistance: Min guard;Min assist Gait Distance (Feet): 180 Feet Assistive device: Straight cane;Rolling walker (2 wheeled) Gait Pattern/deviations: Step-through pattern;Decreased stride length;Trunk flexed;Antalgic   Gait velocity  interpretation: 1.31 - 2.62 ft/sec, indicative of limited community ambulator General Gait Details: Amb 10' + 74' with SPC and seated rest breaks, 1x LOB requiring minA to correct. Ambulated additional 40'+100' with RW and close min guard, cues for safety to maintain proximity to and feet within RW with turning, pt carried this cue over but still moving fast requiring intermittent cues for safety. SpO2 >/91% on RA; DOE up to 3/4   Stairs             Wheelchair Mobility    Modified Rankin (Stroke Patients Only)       Balance Overall balance assessment: Needs assistance Sitting-balance support: No upper extremity supported;Feet supported Sitting balance-Leahy Scale: Good     Standing balance support: Single extremity supported;During functional activity;Bilateral upper extremity supported Standing balance-Leahy Scale: Fair Standing balance comment: Can static stand without UE support; reliant on UE support for dynamic stability                            Cognition Arousal/Alertness: Awake/alert Behavior During Therapy: WFL for tasks assessed/performed Overall Cognitive Status: Impaired/Different from baseline Area of Impairment: Attention;Memory;Following commands;Safety/judgement;Awareness                   Current Attention Level: Selective Memory: Decreased short-term memory Following Commands: Follows multi-step commands inconsistently Safety/Judgement: Decreased awareness of safety;Decreased awareness of deficits Awareness: Emergent   General Comments: Frequent cues for safety, with poor attention and decreased short-term memory (?); likely some component of baseline cognition      Exercises      General Comments General comments (skin integrity, edema, etc.): Wife present and supportive. Pt with SpO2 91-100% on RA; pt requesting use  of O2 due to SOB, educ on current SpO2 and may not need O2 upon return home      Pertinent Vitals/Pain Pain  Assessment: Faces Faces Pain Scale: Hurts a little bit Pain Location: Knees (chronic) Pain Descriptors / Indicators: Sore;Aching;Constant Pain Intervention(s): Monitored during session;Limited activity within patient's tolerance    Home Living                      Prior Function            PT Goals (current goals can now be found in the care plan section) Progress towards PT goals: Progressing toward goals    Frequency    Min 3X/week      PT Plan Current plan remains appropriate    Co-evaluation              AM-PAC PT "6 Clicks" Mobility   Outcome Measure  Help needed turning from your back to your side while in a flat bed without using bedrails?: None Help needed moving from lying on your back to sitting on the side of a flat bed without using bedrails?: None Help needed moving to and from a bed to a chair (including a wheelchair)?: A Little Help needed standing up from a chair using your arms (e.g., wheelchair or bedside chair)?: None Help needed to walk in hospital room?: A Little Help needed climbing 3-5 steps with a railing? : A Little 6 Click Score: 21    End of Session Equipment Utilized During Treatment: Gait belt Activity Tolerance: Patient tolerated treatment well Patient left: in chair;with call bell/phone within reach;with family/visitor present Nurse Communication: Mobility status PT Visit Diagnosis: Difficulty in walking, not elsewhere classified (R26.2)     Time: 2353-6144 PT Time Calculation (min) (ACUTE ONLY): 35 min  Charges:  $Gait Training: 8-22 mins $Therapeutic Exercise: 8-22 mins                     Mabeline Caras, PT, DPT Acute Rehabilitation Services  Pager [phone] Office Start 12/06/2019, 5:19 PM

## 2019-12-06 NOTE — TOC Initial Note (Signed)
Transition of Care [facility]) - Initial/Assessment Note    Patient Details  Name: [name] MRN: [id number] Date of Birth: 03-Feb-1941  [facility] of [facility] CM/SW Contact:    Maryclare Labrador, RN Phone Number: 12/06/2019, 1:57 PM  Clinical Narrative:    PTA independent from home with spouse.  Pt confirms he has a PCP and denied barriers with paying for medications.  Pt declined for CM to arrange post discharges follow up appt with his PCP - pt plans to do it next week.  Pt is interested in [facility] as recommended - CM provided medicare.gov HH choice - pt chose [facility] accepts.  HH/face to face orders requested.                  Expected Discharge Plan: [place] Barriers to Discharge: Continued Medical Work up   Patient Goals and CMS Choice Patient states their goals for this hospitalization and ongoing recovery are:: Pt states he is ready to be able to get back home and do thinkgs in his yard CMS Medicare.gov Compare Post Acute Care list provided to:: Patient Choice offered to / list presented to : Patient  Expected Discharge Plan and Services Expected Discharge Plan: [place]       Living arrangements for the past 2 months: Single Family Home                           HH Arranged: PT HH Agency: [facility] Date [facility] Agency Contacted: 12/06/19 Time HH Agency Contacted: [id number] Representative spoke with at [place]: Tommi Rumps  Prior Living Arrangements/Services Living arrangements for the past 2 months: [place] with:: Spouse   Do you feel safe going back to the place where you live?: Yes      Need for Family Participation in Patient Care: No (Comment) Care giver support system in place?: Yes (comment) Current home services: DME(currently uses a cane) Criminal Activity/Legal Involvement Pertinent to Current Situation/Hospitalization: No - Comment as needed  Activities of Daily Living Home Assistive  Devices/Equipment: None ADL Screening (condition at time of admission) Patient's cognitive ability adequate to safely complete daily activities?: Yes Is the patient deaf or have difficulty hearing?: No Does the patient have difficulty seeing, even when wearing glasses/contacts?: No Does the patient have difficulty concentrating, remembering, or making decisions?: No Patient able to express need for assistance with ADLs?: Yes Does the patient have difficulty dressing or bathing?: No Independently performs ADLs?: Yes (appropriate for developmental age) Does the patient have difficulty walking or climbing stairs?: No Weakness of Legs: None Weakness of Arms/Hands: None  Permission Sought/Granted   Permission granted to share information with : Yes, Verbal Permission Granted              Emotional Assessment   Attitude/Demeanor/Rapport: Self-Confident, Charismatic, Gracious, Engaged Affect (typically observed): Afraid/Fearful, Accepting, Adaptable Orientation: : Oriented to Self, Oriented to Place, Oriented to  Time, Oriented to Situation   Psych Involvement: No (comment)  Admission diagnosis:  Shortness of breath [R06.02] Acute on chronic respiratory failure (HCC) [J96.20] Cirrhosis of liver with ascites, unspecified hepatic cirrhosis type (HCC) [K74.60, R18.8] Acute congestive heart failure, unspecified heart failure type ([place]) [I50.9] Patient Active Problem List   Diagnosis Date Noted  . Acute on chronic systolic CHF (congestive heart failure) ([place]) 12/05/2019  . Acute on chronic diastolic CHF (congestive heart failure) ([place]) 12/04/2019  .  Acute on chronic respiratory failure ([place]) 12/03/2019  . Diabetic ulcer of right midfoot associated with diabetes mellitus due to underlying condition, with fat layer exposed ([place]) 11/07/2019  . Anemia 03/05/2018  . GERD (gastroesophageal reflux disease) 09/30/2017  . 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 positive 03/29/2016  . Thrombocytopenia ([place])  12/21/2015  . B12 deficiency 08/20/2015  . 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 Diabetic polyneuropathy associated with type 2 diabetes mellitus ([place]) 08/07/2015  . Diplopia 06/05/2015  . CKD (chronic kidney disease), stage III 05/06/2015  . Fatty liver 11/04/2014  . 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 PCO (posterior capsular opacification) 06/19/2013  . 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 Pseudophakia of left eye 04/10/2013  . ILD (interstitial lung disease) ([place]) 07/05/2012  . Nuclear cataract 01/20/2012  . 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 COPD (chronic obstructive pulmonary disease) ([place]) 12/15/2009  . BPH (benign prostatic hyperplasia) 06/20/2007  . Depression 05/02/2007  . Chronic back pain. Off narcotics 06/05/17 due to negative UDS for opiates x2. Full history 06/12/14. Pain contract signed.  05/02/2007  . DM (diabetes mellitus) type II controlled, neurological manifestation ([place]) 04/06/2007  . Hyperlipidemia associated with type 2 diabetes mellitus ([place]) 04/06/2007  . Essential hypertension 04/06/2007  . Osteoarthritis 04/06/2007   PCP:  [name], MD Pharmacy:   [facility], [address] [place] [address] Phone: [phone] Fax: [phone]  [facility] [address] [place] - [address] [place] [place] [address] Phone: [phone] Fax: [facility], [place] [address] Phone: [phone] Fax: [phone]  [facility] [address] [place] [address] Phone: [phone] Fax: [phone]     Social Determinants of Health (SDOH) Interventions    Readmission Risk Interventions No flowsheet data found.

## 2019-12-06 NOTE — Progress Notes (Addendum)
PROGRESS NOTE    [name]  JQB:[id number] DOB: 1940/10/30 DOA: 12/03/2019 PCP: [name], MD    Brief Narrative:  Patient has been admitted to the hospital with a working diagnosis of acute on chronic diastolic heart failure exacerbation, complicated by bilateral pleural effusions and cardiogenic pulmonary edema.  79 year old male who presented with dyspnea. He does have significant past medical history for COPD with chronic hypoxic respiratory failure, hypertension, dyslipidemia, type 2 diabetes mellitus, deep vein thrombosis and BPH. Reported 5 days of dyspnea, progressive in nature,worse with minimal exertion. On his initial physical examination his temperature was 98.4, blood pressure 134/80, heart rate 115 227 bpm, respiratory 20, oxygen saturation 93%. Lungs had decreased entry bilaterally, heart S1-S2 present, rhythmic, abdomen protuberant, positive lower extremity edema bilaterally. Sodium 144, potassium 4.9, chloride 108, bicarb 24, glucose 156, BUN 14, creatinine 1.17, BNP 927, troponin I 21, white count 7.1, hemoglobin 11.5, hematocrit 35.6, platelets 140.SARS COVID-19 negative. Chest radiograph with bilateral hilar congestion and small bilateral pleural effusions. CT chest with bilateral pleural effusions negative for pulmonary embolism. EKG 129 bpm, normal axis, normal intervals, sinus rhythm, no ST segment T wave changes, poor R wave progression.  Patient has been placed on diuresis with improvement of his symptoms.  Further workup with echocardiography showed significant reduction in LV systolic function, down to 20 to 25% , no significant valvular disease, global hypokinesis of the left ventricle.   Worsening renal function.    Assessment & Plan:   Principal Problem:   Acute on chronic systolic CHF (congestive heart failure) (HCC) Active Problems:   DM (diabetes mellitus) type II controlled, neurological manifestation (HCC)   Hyperlipidemia  associated with type 2 diabetes mellitus (HCC)   Essential hypertension   COPD (chronic obstructive pulmonary disease) (HCC)   CKD (chronic kidney disease), stage III   Acute on chronic respiratory failure ([place])    1. Acute hypoxic respiratory failure due to cardiogenic pulmonary edema with bilateral pleural effusions, due to acute on chronic systolic heart failure. Echocardiogram with global left ventricle hypokinesis, with reduced systolic function down to 20 to 25%. Urine output over last 24 documented 150 ml. Improved oxygenation,  now on 2 L/ min per [place] with oxygenation 98%.   Blood pressure 127/76, will continue carvedilol low dose, will hold on ace inh and diuretics for now due to worsening renal function.   2. AKI on CKD stage 3a. Worsening renal function with serum cr up to 2,1 with K at 4,2 and serum bicarbonate at 24, documented only 150 ml of urine out put over last 24. Will continue to hold on furosemide and lisinopril and follow on renal panel in am, continue close monitoring of urine output and blood pressure.   Patient continue at a high risk of worsening renal function.  2. Chronic COPD. No current clinical signs of exacerbation, on budesonide.   3. CAD. stable.   4. Liver cirrhosis with splenomegaly.  US liver with no ascites. Currently compensated.   5. HTN. Continue with carvedilol, keep MAP 60 to 65 mmHg.   6. T2Dm with dyslipidemia. Fasting glucose this am is 106, on insulin sliding scale for glucose cover and monitoring. On rosuvastatin.  7. Depression. On amitriptyline.   8. BPH. Continue with tamsulosin and finasteride, follow on urine output, bladder scan if needed.   DVT prophylaxis:enoxaparin Code Status:full Family Communication:no family at the bedside. Disposition Plan/ discharge barriers:patient continue acutely ill.     Subjective: Patient is feeling better, dyspnea is  improving, but not yet back to baseline, no nausea or  vomiting, no chest pain.  Objective: Vitals:   12/06/19 0756 12/06/19 0816 12/06/19 0835 12/06/19 1523  BP: 113/76 112/69  127/76  Pulse: 88 (!) 101  99  Resp:  20    Temp:  97.7 F (36.5 C)    TempSrc:      SpO2:  99% 98%   Weight:      Height:        Intake/Output Summary (Last 24 hours) at 12/06/2019 1653 Last data filed at 12/06/2019 1001 Gross per 24 hour  Intake 240 ml  Output 350 ml  Net -110 ml   Filed Weights   12/03/19 1523  Weight: 88 kg    Examination:   General: Not in pain or dyspnea, deconditioned  Neurology: Awake and alert, non focal  E ENT: no pallor, no icterus, oral mucosa moist Cardiovascular: No JVD. S1-S2 present, rhythmic, no gallops, rubs, or murmurs. No lower extremity edema. Pulmonary: positive breath sounds bilaterally, adequate air movement, no wheezing, rhonchi or rales. Mild decreased breath sounds at bases.  Gastrointestinal. Abdomen flat, no organomegaly, non tender, no rebound or guarding Skin. No rashes Musculoskeletal: no joint deformities     Data Reviewed: I have personally reviewed following labs and imaging studies  CBC: Recent Labs  Lab 12/03/19 1538 12/04/19 0312  WBC 7.1 8.6  HGB 11.5* 11.6*  HCT 35.6* 34.9*  MCV 105.0* 101.7*  PLT 140* 831   Basic Metabolic Panel: Recent Labs  Lab 12/03/19 1538 12/04/19 0312 12/05/19 0307 12/06/19 0244  NA 144 143 139 136  K 4.9 4.2 4.7 4.2  CL 108 108 103 101  CO2 24 23 26 24   GLUCOSE 156* 143* 130* 106*  BUN 14 14 29* 44*  CREATININE 1.17 1.30* 1.76* 2.10*  CALCIUM 9.5 9.1 8.8* 8.5*  MG  --  1.3*  --   --   PHOS  --  4.8*  --   --    GFR: Estimated Creatinine Clearance: 31.8 mL/min (A) (by C-G formula based on SCr of 2.1 mg/dL (H)). Liver Function Tests: Recent Labs  Lab 12/04/19 0312  AST 27  26  ALT 15  15  ALKPHOS 80  79  BILITOT 1.0  0.9  PROT 6.6  6.3*  ALBUMIN 3.4*  3.4*   No results for input(s): LIPASE, AMYLASE in the last 168 hours. No  results for input(s): AMMONIA in the last 168 hours. Coagulation Profile: Recent Labs  Lab 12/04/19 0312  INR 1.1   Cardiac Enzymes: No results for input(s): CKTOTAL, CKMB, CKMBINDEX, TROPONINI in the last 168 hours. BNP (last 3 results) No results for input(s): PROBNP in the last 8760 hours. HbA1C: No results for input(s): HGBA1C in the last 72 hours. CBG: Recent Labs  Lab 12/05/19 1133 12/05/19 1654 12/05/19 2053 12/06/19 0739 12/06/19 1153  GLUCAP 198* 122* 125* 110* 179*   Lipid Profile: No results for input(s): CHOL, HDL, LDLCALC, TRIG, CHOLHDL, LDLDIRECT in the last 72 hours. Thyroid Function Tests: Recent Labs    12/04/19 0312  TSH 4.225   Anemia Panel: No results for input(s): VITAMINB12, FOLATE, FERRITIN, TIBC, IRON, RETICCTPCT in the last 72 hours.    Radiology Studies: I have reviewed all of the imaging during this hospital visit personally     Scheduled Meds: . acyclovir  400 mg Oral BID  . amitriptyline  50 mg Oral QHS  . ascorbic acid  500 mg Oral Daily  . budesonide (  PULMICORT) nebulizer solution  0.25 mg Nebulization BID  . carvedilol  3.125 mg Oral BID WC  . cholecalciferol  1,000 Units Oral BID  . enoxaparin (LOVENOX) injection  40 mg Subcutaneous Q24H  . famotidine  20 mg Oral Daily  . finasteride  5 mg Oral Daily  . gabapentin  300 mg Oral BID  . insulin aspart  0-5 Units Subcutaneous QHS  . insulin aspart  0-9 Units Subcutaneous TID WC  . multivitamin with minerals  1 tablet Oral Daily  . pantoprazole  40 mg Oral Daily  . rosuvastatin  10 mg Oral Daily  . sodium chloride  1 drop Left Eye QHS  . tamsulosin  0.4 mg Oral Daily   Continuous Infusions:   LOS: 3 days        Naomee Nowland Gerome Apley, MD

## 2019-12-07 LAB — GLUCOSE, CAPILLARY
Glucose-Capillary: 121 mg/dL — ABNORMAL HIGH (ref 70–99)
Glucose-Capillary: 200 mg/dL — ABNORMAL HIGH (ref 70–99)
Glucose-Capillary: 109 mg/dL — ABNORMAL HIGH (ref 70–99)
Glucose-Capillary: 152 mg/dL — ABNORMAL HIGH (ref 70–99)

## 2019-12-07 LAB — BASIC METABOLIC PANEL
Anion gap: 9 (ref 5–15)
BUN: 39 mg/dL — ABNORMAL HIGH (ref 8–23)
CO2: 25 mmol/L (ref 22–32)
Calcium: 8.7 mg/dL — ABNORMAL LOW (ref 8.9–10.3)
Chloride: 105 mmol/L (ref 98–111)
Creatinine, Ser: 1.6 mg/dL — ABNORMAL HIGH (ref 0.61–1.24)
GFR calc Af Amer: 47 mL/min — ABNORMAL LOW (ref >60)
GFR calc non Af Amer: 41 mL/min — ABNORMAL LOW (ref >60)
Glucose, Bld: 130 mg/dL — ABNORMAL HIGH (ref 70–99)
Potassium: 4.5 mmol/L (ref 3.5–5.1)
Sodium: 139 mmol/L (ref 135–145)

## 2019-12-07 NOTE — Progress Notes (Signed)
SATURATION QUALIFICATIONS: (This note is used to comply with regulatory documentation for home oxygen)  Patient Saturations on Room Air at Rest = 97%  Patient Saturations on Room Air while Ambulating = 95%  Patient Saturations on N/A Liters of oxygen while Ambulating = N/A%  Please briefly explain why patient needs home oxygen:Patientt report shortness of breath, but oxygen maintain at 95% on Room air

## 2019-12-07 NOTE — Progress Notes (Signed)
PROGRESS NOTE    [name]  QHU:[id number] DOB: [birth year] DOA: 12/03/2019 PCP: [name], MD    Brief Narrative:  Patient has been admitted to the hospital with a working diagnosis of acute on chronic diastolic heart failure exacerbation, complicated by bilateral pleural effusions and cardiogenic pulmonary edema.Developed AKI, now resolving.   79 year old male who presented with dyspnea. He does have significant past medical history for COPD with chronic hypoxic respiratory failure, hypertension, dyslipidemia, type 2 diabetes mellitus, deep vein thrombosis and BPH. Reported 5 days of dyspnea, progressive in nature,worse with minimal exertion. On his initial physical examination his temperature was 98.4, blood pressure 134/80, heart rate 115 227 bpm, respiratory 20, oxygen saturation 93%. Lungs had decreased entry bilaterally, heart S1-S2 present, rhythmic, abdomen protuberant, positive lower extremity edema bilaterally. Sodium 144, potassium 4.9, chloride 108, bicarb 24, glucose 156, BUN 14, creatinine 1.17, BNP 927, troponin I 21, white count 7.1, hemoglobin 11.5, hematocrit 35.6, platelets 140.SARS COVID-19 negative. Chest radiograph with bilateral hilar congestion and small bilateral pleural effusions. CT chest with bilateral pleural effusions negative for pulmonary embolism. EKG 129 bpm, normal axis, normal intervals, sinus rhythm, no ST segment T wave changes, poor R wave progression.  Patient has been placed on diuresis with improvement of his symptoms.  Further workup with echocardiography showed significant reduction in LV systolic function, down to 20 to 25% , no significant valvular disease, global hypokinesis of the left ventricle.  Cardiorenal syndrome with now improving renal function.    Assessment & Plan:   Principal Problem:   Acute on chronic systolic CHF (congestive heart failure) (HCC) Active Problems:   DM (diabetes mellitus) type II  controlled, neurological manifestation (HCC)   Hyperlipidemia associated with type 2 diabetes mellitus (HCC)   Essential hypertension   COPD (chronic obstructive pulmonary disease) (HCC)   CKD (chronic kidney disease), stage III   Acute on chronic respiratory failure ([place])   1. 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 Echocardiogram with global left ventricle hypokinesis, with reduced systolic function down to 20 to 25%. Urine output over last 24 H is 1,450 ml. Continue on 2 L/ min per [place] with oxygenation 95%.   Blood pressure 141/77 mmHg. Tolerating well carvedilol 3.125 mg bid, will add valsartan in am, for heart failure, and possible as outpatient start entresto.   2. AKI on CKD stage 3a. Renal function with serum cr down to 1,60 with K at 4,5 and serum bicarbonate at 25, good urine output at 1,450 ml. Will continue to follow up renal function in am. Continue to hold on furosemide for now.   3. Chronic COPD/ ILD. No signs of acute clinical signs of exacerbation,continue with budesonide. Old records personally reviewed, patient had dx in the past of ILD. Will do ambulatory oxymetry on room air, patient may qualify for home 02.   4. CAD.stable/ chest pain free.   5. Liver cirrhosis with splenomegaly. US liver with no ascites. No clinical signs of decompensation. .   6. HTN.Improved blood pressure, will continue carvedilol, possible addition of valsartan in am.   7. T2Dm with dyslipidemia.Fasting glucose this am is 130, contineu with  insulin sliding scale for glucose cover and monitoring. Continue with rosuvastatin.  8. Depression. Continue with amitriptyline.  9. BPH. On tamsulosin and finasteride. No signs of urinary retention.   DVT prophylaxis:enoxaparin Code Status:full Family Communication:I spoke with  patient's daughter at  the bedside, we talked in detail about patient's condition, plan of care and prognosis and all questions were addressed.  Disposition Plan/ discharge barriers:plan for dc home in am, if renal function and blood pressure stable.    Subjective: Patient is feeling better, close to baseline, no nausea or vomiting, no chest pain. Required supplemental 02 at night during sleep. No nausea or vomiting.   Objective: Vitals:   12/06/19 2005 12/06/19 2238 12/07/19 0530 12/07/19 0812  BP:  124/77  (!) 141/77  Pulse: 98 97  (!) 103  Resp: 16 20 16 17   Temp:  98.1 F (36.7 C)  97.8 F (36.6 C)  TempSrc:  Oral  Oral  SpO2: 98% 100%  95%  Weight:      Height:        Intake/Output Summary (Last 24 hours) at 12/07/2019 0908 Last data filed at 12/07/2019 0700 Gross per 24 hour  Intake --  Output 1450 ml  Net -1450 ml   Filed Weights   12/03/19 1523  Weight: 88 kg    Examination:   General: deconditioned  Neurology: Awake and alert, non focal  E ENT: mild pallor, no icterus, oral mucosa moist Cardiovascular: No JVD. S1-S2 present, rhythmic, no gallops, rubs, or murmurs. No lower extremity edema. Pulmonary: positive breath sounds bilaterally, adequate air movement, no wheezing or rhonchi, mild bilateral rales at bases. Gastrointestinal. Abdomen with no organomegaly, non tender, no rebound or guarding Skin. No rashes Musculoskeletal: no joint deformities     Data Reviewed: I have personally reviewed following labs and imaging studies  CBC: Recent Labs  Lab 12/03/19 1538 12/04/19 0312  WBC 7.1 8.6  HGB 11.5* 11.6*  HCT 35.6* 34.9*  MCV 105.0* 101.7*  PLT 140* 948   Basic Metabolic Panel: Recent Labs  Lab 12/03/19 1538 12/04/19 0312 12/05/19 0307 12/06/19 0244 12/07/19 0216  NA 144 143 139 136 139  K 4.9 4.2 4.7 4.2 4.5  CL 108 108 103 101 105  CO2 24 23 26 24 25   GLUCOSE 156* 143* 130* 106* 130*  BUN 14 14 29* 44* 39*  CREATININE 1.17 1.30*  1.76* 2.10* 1.60*  CALCIUM 9.5 9.1 8.8* 8.5* 8.7*  MG  --  1.3*  --   --   --   PHOS  --  4.8*  --   --   --    GFR: Estimated Creatinine Clearance: 41.8 mL/min (A) (by C-G formula based on SCr of 1.6 mg/dL (H)). Liver Function Tests: Recent Labs  Lab 12/04/19 0312  AST 27  26  ALT 15  15  ALKPHOS 80  79  BILITOT 1.0  0.9  PROT 6.6  6.3*  ALBUMIN 3.4*  3.4*   No results for input(s): LIPASE, AMYLASE in the last 168 hours. No results for input(s): AMMONIA in the last 168 hours. Coagulation Profile: Recent Labs  Lab 12/04/19 0312  INR 1.1   Cardiac Enzymes: No results for input(s): CKTOTAL, CKMB, CKMBINDEX, TROPONINI in the last 168 hours. BNP (last 3 results) No results for input(s): PROBNP in the last 8760 hours. HbA1C: No results for input(s): HGBA1C in the last 72 hours. CBG: Recent Labs  Lab 12/06/19 0739 12/06/19 1153 12/06/19 1717 12/06/19 2102 12/07/19 0814  GLUCAP 110* 179* 159* 145* 121*   Lipid Profile: No results for input(s): CHOL, HDL, LDLCALC, TRIG, CHOLHDL, LDLDIRECT in the last 72 hours. Thyroid Function Tests: No results for input(s): TSH, T4TOTAL, FREET4, T3FREE, THYROIDAB in the last 72 hours. Anemia Panel: No  results for input(s): VITAMINB12, FOLATE, FERRITIN, TIBC, IRON, RETICCTPCT in the last 72 hours.    Radiology Studies: I have reviewed all of the imaging during this hospital visit personally     Scheduled Meds: . acyclovir  400 mg Oral BID  . amitriptyline  50 mg Oral QHS  . ascorbic acid  500 mg Oral Daily  . budesonide (PULMICORT) nebulizer solution  0.25 mg Nebulization BID  . carvedilol  3.125 mg Oral BID WC  . cholecalciferol  1,000 Units Oral BID  . enoxaparin (LOVENOX) injection  40 mg Subcutaneous Q24H  . famotidine  20 mg Oral Daily  . finasteride  5 mg Oral Daily  . gabapentin  300 mg Oral BID  . insulin aspart  0-5 Units Subcutaneous QHS  . insulin aspart  0-9 Units Subcutaneous TID WC  . multivitamin with  minerals  1 tablet Oral Daily  . pantoprazole  40 mg Oral Daily  . rosuvastatin  10 mg Oral Daily  . sodium chloride  1 drop Left Eye QHS  . tamsulosin  0.4 mg Oral Daily   Continuous Infusions:   LOS: 4 days        Roben Tatsch Gerome Apley, MD

## 2019-12-08 LAB — BASIC METABOLIC PANEL
Anion gap: 9 (ref 5–15)
BUN: 30 mg/dL — ABNORMAL HIGH (ref 8–23)
CO2: 25 mmol/L (ref 22–32)
Calcium: 8.9 mg/dL (ref 8.9–10.3)
Chloride: 105 mmol/L (ref 98–111)
Creatinine, Ser: 1.38 mg/dL — ABNORMAL HIGH (ref 0.61–1.24)
GFR calc Af Amer: 56 mL/min — ABNORMAL LOW (ref >60)
GFR calc non Af Amer: 49 mL/min — ABNORMAL LOW (ref >60)
Glucose, Bld: 122 mg/dL — ABNORMAL HIGH (ref 70–99)
Potassium: 4.6 mmol/L (ref 3.5–5.1)
Sodium: 139 mmol/L (ref 135–145)

## 2019-12-08 LAB — GLUCOSE, CAPILLARY
Glucose-Capillary: 186 mg/dL — ABNORMAL HIGH (ref 70–99)
Glucose-Capillary: 131 mg/dL — ABNORMAL HIGH (ref 70–99)

## 2019-12-08 MED ORDER — LISINOPRIL 5 MG PO TABS
2.5000 mg | ORAL_TABLET | Freq: Every day | ORAL | Status: DC
  Administered 2019-12-08: 2.5 mg via ORAL
  Filled 2019-12-08: qty 1

## 2019-12-08 MED ORDER — CARVEDILOL 3.125 MG PO TABS: 3.1250 mg | ORAL_TABLET | Freq: Two times a day (BID) | ORAL | 0 refills | Status: DC

## 2019-12-08 MED ORDER — FUROSEMIDE 20 MG PO TABS: 20.0000 mg | ORAL_TABLET | Freq: Every day | ORAL | 0 refills | Status: DC

## 2019-12-08 NOTE — Progress Notes (Signed)
SATURATION QUALIFICATIONS: (This note is used to comply with regulatory documentation for home oxygen)  Patient Saturations on Room Air at Rest = 95%  Patient Saturations on Room Air while Ambulating =  94%  Patient Saturations on N/a Liters of oxygen while Ambulating =  N/a%  Please briefly explain why patient needs home oxygen: Patient was a little short of breath while ambulating

## 2019-12-08 NOTE — TOC Transition Note (Signed)
Transition of Care [facility]) - CM/SW Discharge Note   Patient Details  Name: ANVAY TENNIS MRN: [id number] Date of Birth: [birth year]  [facility] CM/SW Contact:  Carles Collet, RN Phone Number: 12/08/2019, 12:22 PM   Clinical Narrative:    Patient will DC w [facility]mings for [facility] services. Spoke w Dr Cathlean Sauer, receieved orders for home nocturnal oxygen test through Huey Romans, MD approved for it to done Monday. Orders placed per [employer] specifications. Patient updated that he will be called (and to make sure he answers his phone, number verified) for a home sleeping oxygen test. He verbalized understanding.     Final next level of care: [place] Barriers to Discharge: No Barriers Identified   Patient Goals and CMS Choice Patient states their goals for this hospitalization and ongoing recovery are:: Pt states he is ready to be able to get back home and do thinkgs in his yard CMS Medicare.gov Compare Post Acute Care list provided to:: Patient Choice offered to / list presented to : Patient  Discharge Placement                       Discharge Plan and Services                    Date DME Agency Contacted: 12/08/19 Time DME Agency Contacted: 1222 Representative spoke with at DME Agency: Magda Paganini HH Arranged: PT [place]: [place] Date [place]: 12/08/19 Time [place]: 1222 Representative spoke with at [place]: [place] Determinants of Health ([place]) Interventions     Readmission Risk Interventions No flowsheet data found.

## 2019-12-08 NOTE — Discharge Summary (Addendum)
Physician Discharge Summary  [name] MRN:[id number] DOB: 09/22/1941 DOA: 12/03/2019  PCP: Hunter, Stephen O, MD  Admit date: 12/03/2019 Discharge date: 12/08/2019  Admitted From: Home  Disposition:  Home   Recommendations for Outpatient Follow-up and new medication changes:  1. Follow up with Dr. Hunter in 7 days.  2. Follow up with Cardiology in 2 weeks. 3. Metoprolol has been exchange to carvedilol  4. Added 20 mg furosemide daily. 5. Instructed take furosemide twice daily in case of recurrent shortness of breath, leg swelling or weight gain 3 lbs in 24 hours or 5 lbs in 7 days. (information given to her daughter over the phone) 6. Continue low dose lisinopril and discontinue amlodipine.   Home Health: Yes   Equipment/Devices: Home 02 to use at night.   Discharge Condition: stable  CODE STATUS: full  Diet recommendation: heart healthy and diabetic prudent.   Brief/Interim Summary: Patient was admitted to the hospital with a working diagnosis of acute on chronic systolic heart failure exacerbation, complicated by bilateral pleural effusions and cardiogenic pulmonary edema.Developed AKI.    78-year-old male who presented with dyspnea. He does have significant past medical history for COPD with chronic hypoxic respiratory failure, hypertension, dyslipidemia, type 2 diabetes mellitus, hx of deep vein thrombosis and BPH. Reported 5 days of dyspnea, progressive in nature,worse with minimal exertion. On his initial physical examination his temperature was 98.4, blood pressure 134/80, heart rate 115  bpm, respiratory rate 20, oxygen saturation 93%. Lungs had decreased air entry bilaterally, heart S1-S2 present, rhythmic, abdomen soft and non tender, positive lower extremity edema bilaterally. Sodium 144, potassium 4.9, chloride 108, bicarb 24, glucose 156, BUN 14, creatinine 1.17, BNP 927, troponin I 21, white count 7.1, hemoglobin 11.5, hematocrit 35.6, platelets 140.SARS COVID-19  negative. Chest radiograph with bilateral hilar congestion and small bilateral pleural effusions. CT chest with bilateral pleural effusions negative for pulmonary embolism. EKG 129 bpm, normal axis, normal intervals, sinus rhythm, no ST segment T wave changes, poor R wave progression.  Patient received aggressive IV diuresis with improvement of his symptoms.  Further workup with echocardiography showed significant reduction in LV systolic function, down to 20 to 25% , no significant valvular disease, global hypokinesis of the left ventricle.  Patient developed cardiorenal syndrome with now improving renal function.  1.    Acute hypoxic respiratory failure due to cardiogenic pulmonary edema with bilateral pleural effusions, due to acute on chronic systolic heart failure exacerbation, suspected nonischemic cardiomyopathy.  Patient was admitted to the telemetry cardiac ward, received aggressive diuresis with intravenous furosemide, negative fluid balance was achieved with significant improvement of patient's symptoms.    Patient underwent further work-up with echocardiography which showed a reduction in his left ventricular ejection fraction down to 20 to 25%, with global hypokinesis.  No significant valvular disease.  The patient was placed on carvedilol with good toleration.  At discharge will continue ACE inhibitor with lisinopril 2.5 mg daily.  Further titration of heart failure regimen as an outpatient.  Continue diuresis at home with furosemide 20 mg daily.  Patient will have home health services arranged.  No clinical signs of acute coronary syndrome.  2.  Acute kidney injury on chronic kidney disease stage IIIa.  Patient received intravenous furosemide good good response, his daily urine output was over a liter.  His peak creatinine reached 2.10, diuresis was held with improvement of kidney function, at discharge his serum creatinine is 1.38, BUN 30, sodium 139, potassium 4.6, chloride  105 bicarb 25.     At discharge patient will continue 20 mg of furosemide daily, follow-up kidney function as an outpatient.  3.  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  No signs of acute exacerbation, patient continue inhaled bronchodilators and corticosteroids.  Unable to assess PA pressures per echocardiography.  Ambulatory oximetry on room air 95%.  Patient had oxygen desaturation at night on room air while sleeping down to th 80's per report from nursing. Will order patient to have home 02 at night.   4.  Liver cirrhosis.  Incidental finding of liver cirrhosis per CT chest, further work-up with abdominal ultrasonography showed heterogeneous echotexture of the liver with nodular liver Consistent with Cirrhosis.  The portal vein was patent.  Liver function tests within normal limits with AST 26, ALT 15, total bilirubin was 1.0  5.  Hypertension.  Patient developed hypotension as a consequence of diuresis, antihypertensive agents were held, at discharge patient will resume lisinopril low-dose 2.5 mg daily.  6.  Type 2 diabetes mellitus with dyslipidemia.  Hemoglobin A1c 5.9, patient will continue at discharge Metformin for glucose control.  Continue statin therapy.  7.  Depression.  Continue amitriptyline  8.  BPH continue finasteride and tamsulosin, no signs of urine retention.  Discharge Diagnoses:  Principal Problem:   Acute on chronic systolic CHF (congestive heart failure) (HCC) Active Problems:   DM (diabetes mellitus) type II controlled, neurological manifestation (HCC)   Hyperlipidemia associated with type 2 diabetes mellitus (HCC)   Essential hypertension   COPD (chronic obstructive pulmonary disease) (HCC)   CKD (chronic kidney disease), stage III   Acute on chronic respiratory failure (HCC)    Discharge Instructions   Allergies as of 12/08/2019   No Known Allergies     Medication List    STOP taking these medications   amLODipine 10 MG tablet Commonly known as: NORVASC      TAKE these medications   acyclovir 400 MG tablet Commonly known as: ZOVIRAX TAKE 1 TABLET TWICE DAILY   albuterol 108 (90 Base) MCG/ACT inhaler Commonly known as: VENTOLIN HFA Inhale 2 puffs into the lungs every 6 (six) hours as needed for wheezing or shortness of breath.   amitriptyline 50 MG tablet Commonly known as: ELAVIL TAKE 1 TABLET AT BEDTIME   ASCORBIC ACID PO Take 1 tablet by mouth daily.   carvedilol 3.125 MG tablet Commonly known as: COREG Take 1 tablet (3.125 mg total) by mouth 2 (two) times daily with a meal.   Cholecalciferol 25 MCG (1000 UT) capsule Take 1 capsule (1,000 Units total) by mouth 2 (two) times daily.   clotrimazole-betamethasone cream Commonly known as: LOTRISONE Apply 1 application topically 2 (two) times daily. For 7 days maximum for severe jock itch . Stop if worsening symptoms.   famotidine 20 MG tablet Commonly known as: PEPCID TAKE 1 TABLET TWICE DAILY What changed: when to take this   finasteride 5 MG tablet Commonly known as: PROSCAR TAKE 1 TABLET EVERY DAY   furosemide 20 MG tablet Commonly known as: Lasix Take 1 tablet (20 mg total) by mouth daily.   gabapentin 300 MG capsule Commonly known as: NEURONTIN TAKE 1 CAPSULE TWICE DAILY   lisinopril 2.5 MG tablet Commonly known as: ZESTRIL Take 1 tablet (2.5 mg total) by mouth daily.   metFORMIN 500 MG tablet Commonly known as: GLUCOPHAGE TAKE 1 TABLET EVERY DAY WITH BREAKFAST What changed:   See the new instructions.  Another medication with the same name was removed. Continue taking this medication, and follow the directions you see here.     multivitamin capsule Take 1 capsule by mouth daily.   NONFORMULARY OR COMPOUNDED ITEM [facility]  Peripheral Neuropathy Cream- Bupivacaine 1%, Doxepin 3%, Gabapentin 6%, Pentoxifylline 3%, Topiramate 1% Apply 1-2 grams to affected area 3-4 times daily Qty. 120 gm 3 refills   omeprazole 20 MG capsule Commonly known as:  PRILOSEC TAKE 1 CAPSULE DAILY 30 TO 60 MINUTES BEFORE FIRST MEAL OF THE DAY What changed: See the new instructions.   rosuvastatin 10 MG tablet Commonly known as: CRESTOR TAKE 1 TABLET EVERY DAY   sodium chloride 5 % ophthalmic solution Commonly known as: MURO 128 Place 1 drop into the left eye at bedtime.   tamsulosin 0.4 MG Caps capsule Commonly known as: FLOMAX TAKE 1 CAPSULE EVERY DAY   True Metrix Air Glucose Meter w/Device Kit   True Metrix Blood Glucose Test test strip Generic drug: glucose blood TEST BLOOD SUGAR EVERY DAY   TRUEplus Lancets 28G Misc Use to test blood sugars two times daily. Dx: E11.9            Durable Medical Equipment  (From admission, onward)         Start     Ordered   12/08/19 0000  For home use only DME oxygen    Question Answer Comment  Length of Need 6 Months   Mode or (Route) Nasal cannula   Liters per Minute 2   Frequency Only at night (stationary unit needed)   Oxygen conserving device Yes   Oxygen delivery system Gas      12/08/19 1112         Follow-up Information    Hunter, Stephen O, MD Follow up in 1 week(s).   Specialty: Family Medicine Contact information: [address] [place] [address] [phone]          No Known Allergies       Procedures/Studies: DG Chest 2 View  Result Date: 12/03/2019 CLINICAL DATA:  Shortness of breath EXAM: CHEST - 2 VIEW COMPARISON:  03/24/2014 FINDINGS: Small bilateral pleural effusions. Patchy airspace disease at the left lung base. Stable cardiomediastinal silhouette with aortic atherosclerosis. Subsegmental atelectasis in the left mid lung. No pneumothorax. IMPRESSION: Small bilateral pleural effusions with patchy atelectasis or pneumonia at the left base. Electronically Signed   By: Kim  Fujinaga M.D.   On: 12/03/2019 16:10   CT Angio Chest PE W/Cm &/Or Wo Cm  Result Date: 12/03/2019 CLINICAL DATA:  78-year-old male with chest pain and shortness of breath.  EXAM: CT ANGIOGRAPHY CHEST WITH CONTRAST TECHNIQUE: Multidetector CT imaging of the chest was performed using the standard protocol during bolus administration of intravenous contrast. Multiplanar CT image reconstructions and MIPs were obtained to evaluate the vascular anatomy. CONTRAST:  100mL OMNIPAQUE IOHEXOL 350 MG/ML SOLN COMPARISON:  Chest CT dated 03/31/2014. Chest radiograph dated 12/03/2019. FINDINGS: Cardiovascular: There is mild cardiomegaly. No pericardial effusion. Advanced 3 vessel coronary vascular calcification. There is advanced atherosclerotic calcification of the thoracic aorta. Evaluation of the pulmonary arteries is limited due to respiratory motion artifact. No pulmonary artery embolus identified. Mediastinum/Nodes: There is no hilar or mediastinal adenopathy. The esophagus is grossly unremarkable. No mediastinal fluid collection. Lungs/Pleura: There is moderate bilateral pleural effusions. There is diffuse interstitial and interlobular septal thickening most consistent with edema. Diffuse bilateral lower lobe predominant hazy densities may represent combination of atelectasis and edema or pneumonia. Clinical correlation is recommended. There is no pneumothorax. The central airways are patent. There is background of emphysema. Upper Abdomen: Irregular liver contour   suggestive of cirrhosis. There is mild bilateral renal parenchyma atrophy with cortical scarring and lobulation. The kidneys are partially visualized and not evaluated. A 3 cm soft tissue prominence in the anterior left kidney likely represents lobulated renal tissue and less likely a mass. Further evaluation with ultrasound on a nonemergent/outpatient basis recommended. Musculoskeletal: Osteopenia with degenerative changes of the spine. Age indeterminate fracture of the lateral aspect of the left ninth rib. Clinical correlation is recommended. Review of the MIP images confirms the above findings. IMPRESSION: 1. No CT evidence of  pulmonary artery embolus. 2. Moderate bilateral pleural effusions with findings of pulmonary edema. Pneumonia is not excluded clinical correlation is recommended. 3. Mild cardiomegaly, 3 vessel coronary vascular calcification, and Aortic Atherosclerosis (ICD10-I70.0) and Emphysema (ICD10-J43.9). 4. Cirrhosis. 5. Lobulated renal parenchyma versus less likely left renal mass. Further evaluation with ultrasound or CT with IV contrast on a nonemergent/outpatient basis recommended. Electronically Signed   By: Arash  Radparvar M.D.   On: 12/03/2019 20:18   US Abdomen Complete  Result Date: 12/04/2019 CLINICAL DATA:  Cirrhosis EXAM: ABDOMEN ULTRASOUND COMPLETE COMPARISON:  04/02/2018 FINDINGS: Gallbladder: Limited visualization of the gallbladder. No evidence of cholelithiasis or cholecystitis. Common bile duct: Diameter: Not identified. Liver: Heterogeneous echotexture of the liver with nodular liver capsule consistent with cirrhosis. Portal vein is patent on color Doppler imaging with normal direction of blood flow towards the liver. IVC: No abnormality visualized. Pancreas: Not visualized due to bowel gas. Spleen: Size and appearance within normal limits. Right Kidney: Length: 10.8. Echogenicity within normal limits. No mass or hydronephrosis visualized. Left Kidney: Length: 10.5. Echogenicity within normal limits. No mass or hydronephrosis visualized. Abdominal aorta: No aneurysm visualized. Other findings: None. IMPRESSION: 1. Nonvisualization of the pancreas and common bile duct due to bowel gas and patient positioning. 2. Cirrhosis. 3. Otherwise unremarkable exam. Electronically Signed   By: Michael  Brown M.D.   On: 12/04/2019 01:43   ECHOCARDIOGRAM COMPLETE  Result Date: 12/04/2019    ECHOCARDIOGRAM REPORT   Patient Name:   [name] Date of Exam: 12/04/2019 Medical Rec #:  [id number]      Height:       72.0 in Accession #:    [id number]     Weight:       194.0 lb Date of Birth:  02/27/1941      BSA:           2.103 m Patient Age:    78 years       BP:           108/73 mmHg Patient Gender: M              HR:           101 bpm. Exam Location:  Inpatient Procedure: 2D Echo and Intracardiac Opacification Agent Indications:    CHF-Acute Diastolic 428.31 / I50.31  History:        Patient has prior history of Echocardiogram examinations, most                 recent 02/05/2018. COPD; Risk Factors:Hypertension, Dyslipidemia                 and Former Smoker.  Sonographer:    Tiffany Cooper RDCS Referring Phys: [id number] [name] I OGBATA IMPRESSIONS  1. No LV thrombus seen on contrast imaging. Left ventricular ejection fraction, by estimation, is 20 to 25%. The left ventricle has severely decreased function. The left ventricle demonstrates global hypokinesis. The left ventricular internal cavity size   was mildly dilated. Indeterminate diastolic filling due to E-A fusion.  2. Right ventricular systolic function is normal. The right ventricular size is normal. Tricuspid regurgitation signal is inadequate for assessing PA pressure.  3. The mitral valve is grossly normal. Trivial mitral valve regurgitation. No evidence of mitral stenosis.  4. The aortic valve is grossly normal. Aortic valve regurgitation is not visualized. Mild aortic valve stenosis. Comparison(s): A prior study was performed on 02/05/2018. Prior images reviewed side by side. Changes from prior study are noted. EF now 20-25% with severe global hypokinesis. FINDINGS  Left Ventricle: No LV thrombus seen on contrast imaging. Left ventricular ejection fraction, by estimation, is 20 to 25%. The left ventricle has severely decreased function. The left ventricle demonstrates global hypokinesis. Definity contrast agent was  given IV to delineate the left ventricular endocardial borders. The left ventricular internal cavity size was mildly dilated. There is no left ventricular hypertrophy. Indeterminate diastolic filling due to E-A fusion. Right Ventricle: The right  ventricular size is normal. No increase in right ventricular wall thickness. Right ventricular systolic function is normal. Tricuspid regurgitation signal is inadequate for assessing PA pressure. Left Atrium: Left atrial size was normal in size. Right Atrium: Right atrial size was normal in size. Pericardium: There is no evidence of pericardial effusion. Mitral Valve: The mitral valve is grossly normal. Trivial mitral valve regurgitation. No evidence of mitral valve stenosis. Tricuspid Valve: The tricuspid valve is grossly normal. Tricuspid valve regurgitation is not demonstrated. No evidence of tricuspid stenosis. Aortic Valve: The aortic valve is grossly normal. Aortic valve regurgitation is not visualized. Mild aortic stenosis is present. Aortic valve mean gradient measures 10.1 mmHg. Aortic valve peak gradient measures 15.9 mmHg. Aortic valve area, by VTI measures 1.52 cm. Pulmonic Valve: The pulmonic valve was grossly normal. Pulmonic valve regurgitation is not visualized. Aorta: The aortic root and ascending aorta are structurally normal, with no evidence of dilitation. Venous: The inferior vena cava was not well visualized. IAS/Shunts: No atrial level shunt detected by color flow Doppler.  LEFT VENTRICLE PLAX 2D LVIDd:         5.00 cm LVIDs:         3.80 cm LV PW:         1.00 cm LV IVS:        1.20 cm LVOT diam:     2.00 cm LV SV:         49 LV SV Index:   23 LVOT Area:     3.14 cm  LV Volumes (MOD) LV vol d, MOD A2C: 177.0 ml LV vol d, MOD A4C: 213.0 ml LV vol s, MOD A2C: 135.0 ml LV vol s, MOD A4C: 173.0 ml LV SV MOD A2C:     42.0 ml LV SV MOD A4C:     213.0 ml LV SV MOD BP:      42.7 ml RIGHT VENTRICLE RV S prime:     18.10 cm/s TAPSE (M-mode): 2.0 cm LEFT ATRIUM             Index       RIGHT ATRIUM           Index LA diam:        2.80 cm 1.33 cm/m  RA Area:     10.20 cm LA Vol (A2C):   74.1 ml 35.23 ml/m RA Volume:   22.00 ml  10.46 ml/m LA Vol (A4C):   44.4 ml 21.11 ml/m LA Biplane Vol: 58.2 ml  27.67 ml/m  AORTIC VALVE AV Area (Vmax):    1.33 cm AV Area (Vmean):   1.28 cm AV Area (VTI):     1.52 cm AV Vmax:           199.08 cm/s AV Vmean:          154.605 cm/s AV VTI:            0.323 m AV Peak Grad:      15.9 mmHg AV Mean Grad:      10.1 mmHg LVOT Vmax:         84.20 cm/s LVOT Vmean:        62.800 cm/s LVOT VTI:          0.156 m LVOT/AV VTI ratio: 0.48  AORTA Ao Root diam: 3.80 cm Ao Asc diam:  3.30 cm MITRAL VALVE MV Area (PHT): 4.80 cm     SHUNTS MV Decel Time: 158 msec     Systemic VTI:  0.16 m MV E velocity: 81.90 cm/s   Systemic Diam: 2.00 cm MV A velocity: 111.00 cm/s MV E/A ratio:  0.74 Armona O'Neal MD Electronically signed by [place] O'Neal MD Signature Date/Time: 12/04/2019/3:57:41 PM    Final       Procedures:   Subjective: Patient is feeling well, back to his baseline, no nausea or vomiting, no chest pain or dyspnea. Tolerating po well.   Discharge Exam: Vitals:   12/08/19 0832 12/08/19 0846  BP:  121/72  Pulse:  99  Resp: (!) 21 18  Temp:  97.8 F (36.6 C)  SpO2: 96% 96%   Vitals:   12/07/19 2312 12/08/19 0341 12/08/19 0832 12/08/19 0846  BP: 116/74 (!) 109/95  121/72  Pulse: 91   99  Resp: 18 16 (!) 21 18  Temp: 97.8 F (36.6 C) 98.2 F (36.8 C)  97.8 F (36.6 C)  TempSrc: Oral Oral    SpO2: 96% 94% 96% 96%  Weight:      Height:        General: Not in pain or dyspnea  Neurology: Awake and alert, non focal  E ENT: no pallor, no icterus, oral mucosa moist Cardiovascular: No JVD. S1-S2 present, rhythmic, no gallops, rubs, or murmurs. No lower extremity edema. Pulmonary: vesicular breath sounds bilaterally, adequate air movement, no wheezing, rhonchi or rales. Gastrointestinal. Abdomen with, no organomegaly, non tender, no rebound or guarding Skin. No rashes Musculoskeletal: no joint deformities   The results of significant diagnostics from this hospitalization (including imaging, microbiology, ancillary and laboratory) are listed below for  reference.     Microbiology: Recent Results (from the past 240 hour(s))  SARS CORONAVIRUS 2 (TAT 6-24 HRS) Nasopharyngeal Nasopharyngeal Swab     Status: None   Collection Time: 12/03/19  9:08 PM   Specimen: Nasopharyngeal Swab  Result Value Ref Range Status   SARS Coronavirus 2 NEGATIVE NEGATIVE Final    Comment: (NOTE) SARS-CoV-2 target nucleic acids are NOT DETECTED. The SARS-CoV-2 RNA is generally detectable in upper and lower respiratory specimens during the acute phase of infection. Negative results do not preclude SARS-CoV-2 infection, do not rule out co-infections with other pathogens, and should not be used as the sole basis for treatment or other patient management decisions. Negative results must be combined with clinical observations, patient history, and epidemiological information. The expected result is Negative. Fact Sheet for Patients: https://www.fda.gov/media/138098/download Fact Sheet for Healthcare Providers: https://www.fda.gov/media/138095/download This test is not yet approved or cleared by the [place] FDA and  has been authorized for   detection and/or diagnosis of SARS-CoV-2 by FDA under an Emergency Use Authorization (EUA). This EUA will remain  in effect (meaning this test can be used) for the duration of the COVID-19 declaration under Section 56 4(b)(1) of the Act, 21 U.S.C. section 360bbb-3(b)(1), unless the authorization is terminated or revoked sooner. Performed at [facility], [address] [place], [place] [address]   MRSA PCR Screening     Status: None   Collection Time: 12/03/19 11:52 PM   Specimen: Nasopharyngeal  Result Value Ref Range Status   MRSA by PCR NEGATIVE NEGATIVE Final    Comment:        The GeneXpert MRSA Assay (FDA approved for NASAL specimens only), is one component of a comprehensive MRSA colonization surveillance program. It is not intended to diagnose MRSA infection nor to guide or monitor treatment  for MRSA infections. Performed at [facility], [address] [place], [place] [address]      Labs: BNP (last 3 results) Recent Labs    12/03/19 1800  BNP 927.6*   Basic Metabolic Panel: Recent Labs  Lab 12/04/19 0312 12/05/19 0307 12/06/19 0244 12/07/19 0216 12/08/19 0311  NA 143 139 136 139 139  K 4.2 4.7 4.2 4.5 4.6  CL 108 103 101 105 105  CO2 23 26 24 25 25  GLUCOSE 143* 130* 106* 130* 122*  BUN 14 29* 44* 39* 30*  CREATININE 1.30* 1.76* 2.10* 1.60* 1.38*  CALCIUM 9.1 8.8* 8.5* 8.7* 8.9  MG 1.3*  --   --   --   --   PHOS 4.8*  --   --   --   --    Liver Function Tests: Recent Labs  Lab 12/04/19 0312  AST 27  26  ALT 15  15  ALKPHOS 80  79  BILITOT 1.0  0.9  PROT 6.6  6.3*  ALBUMIN 3.4*  3.4*   No results for input(s): LIPASE, AMYLASE in the last 168 hours. No results for input(s): AMMONIA in the last 168 hours. CBC: Recent Labs  Lab 12/03/19 1538 12/04/19 0312  WBC 7.1 8.6  HGB 11.5* 11.6*  HCT 35.6* 34.9*  MCV 105.0* 101.7*  PLT 140* 157   Cardiac Enzymes: No results for input(s): CKTOTAL, CKMB, CKMBINDEX, TROPONINI in the last 168 hours. BNP: Invalid input(s): POCBNP CBG: Recent Labs  Lab 12/07/19 0814 12/07/19 1158 12/07/19 1632 12/07/19 2112 12/08/19 0847  GLUCAP 121* 200* 109* 152* 186*   D-Dimer No results for input(s): DDIMER in the last 72 hours. Hgb A1c No results for input(s): HGBA1C in the last 72 hours. Lipid Profile No results for input(s): CHOL, HDL, LDLCALC, TRIG, CHOLHDL, LDLDIRECT in the last 72 hours. Thyroid function studies No results for input(s): TSH, T4TOTAL, T3FREE, THYROIDAB in the last 72 hours.  Invalid input(s): FREET3 Anemia work up No results for input(s): VITAMINB12, FOLATE, FERRITIN, TIBC, IRON, RETICCTPCT in the last 72 hours. Urinalysis    Component Value Date/Time   COLORURINE YELLOW 10/22/2018 1451   APPEARANCEUR CLEAR 10/22/2018 1451   LABSPEC 1.020 10/22/2018 1451   PHURINE  5.5 10/22/2018 1451   GLUCOSEU NEGATIVE 10/22/2018 1451   HGBUR NEGATIVE 10/22/2018 1451   HGBUR negative 05/14/2008 1032   BILIRUBINUR NEGATIVE 10/22/2018 1451   BILIRUBINUR neg 05/15/2017 1330   KETONESUR NEGATIVE 10/22/2018 1451   PROTEINUR trace 05/15/2017 1330   UROBILINOGEN 0.2 10/22/2018 1451   NITRITE NEGATIVE 10/22/2018 1451   LEUKOCYTESUR NEGATIVE 10/22/2018 1451   Sepsis Labs   Invalid input(s): PROCALCITONIN,  WBC,  LACTICIDVEN Microbiology Recent Results (from the past 240 hour(s))  SARS CORONAVIRUS 2 (TAT 6-24 HRS) Nasopharyngeal Nasopharyngeal Swab     Status: None   Collection Time: 12/03/19  9:08 PM   Specimen: Nasopharyngeal Swab  Result Value Ref Range Status   SARS Coronavirus 2 NEGATIVE NEGATIVE Final    Comment: (NOTE) SARS-CoV-2 target nucleic acids are NOT DETECTED. The SARS-CoV-2 RNA is generally detectable in upper and lower respiratory specimens during the acute phase of infection. Negative results do not preclude SARS-CoV-2 infection, do not rule out co-infections with other pathogens, and should not be used as the sole basis for treatment or other patient management decisions. Negative results must be combined with clinical observations, patient history, and epidemiological information. The expected result is Negative. Fact Sheet for Patients: SugarRoll.be Fact Sheet for Healthcare Providers: https://www.woods-mathews.com/ This test is not yet approved or cleared by the [place] FDA and  has been authorized for detection and/or diagnosis of SARS-CoV-2 by FDA under an Emergency Use Authorization (EUA). This EUA will remain  in effect (meaning this test can be used) for the duration of the COVID-19 declaration under Section 56 4(b)(1) of the Act, 21 U.S.C. section 360bbb-3(b)(1), unless the authorization is terminated or revoked sooner. Performed at [facility], [place] [address] [place],  [place] [address]   MRSA PCR Screening     Status: None   Collection Time: 12/03/19 11:52 PM   Specimen: Nasopharyngeal  Result Value Ref Range Status   MRSA by PCR NEGATIVE NEGATIVE Final    Comment:        The GeneXpert MRSA Assay (FDA approved for NASAL specimens only), is one component of a comprehensive MRSA colonization surveillance program. It is not intended to diagnose MRSA infection nor to guide or monitor treatment for MRSA infections. Performed at [facility], [place] [address] [place], [place] [address]      Time coordinating discharge: 45 minutes  SIGNED:   Tawni Millers, MD  [facility] 12/08/2019, 10:09 AM

## 2019-12-09 ENCOUNTER — Other Ambulatory Visit: Payer: Self-pay

## 2019-12-09 ENCOUNTER — Ambulatory Visit: Payer: Medicare HMO | Admitting: Podiatry

## 2019-12-09 ENCOUNTER — Encounter: Payer: Self-pay | Admitting: Podiatry

## 2019-12-09 ENCOUNTER — Encounter: Payer: Self-pay | Admitting: Internal Medicine

## 2019-12-09 DIAGNOSIS — L84 Corns and callosities: Secondary | ICD-10-CM

## 2019-12-09 DIAGNOSIS — M79674 Pain in right toe(s): Secondary | ICD-10-CM

## 2019-12-09 DIAGNOSIS — E1149 Type 2 diabetes mellitus with other diabetic neurological complication: Secondary | ICD-10-CM | POA: Diagnosis not present

## 2019-12-09 DIAGNOSIS — B351 Tinea unguium: Secondary | ICD-10-CM | POA: Diagnosis not present

## 2019-12-09 DIAGNOSIS — M79675 Pain in left toe(s): Secondary | ICD-10-CM

## 2019-12-09 NOTE — Patient Instructions (Signed)
Diabetes Mellitus and Foot Care Foot care is an important part of your health, especially when you have diabetes. Diabetes may cause you to have problems because of poor blood flow (circulation) to your feet and legs, which can cause your skin to:  Become thinner and drier.  Break more easily.  Heal more slowly.  Peel and crack. You may also have nerve damage (neuropathy) in your legs and feet, causing decreased feeling in them. This means that you may not notice minor injuries to your feet that could lead to more serious problems. Noticing and addressing any potential problems early is the best way to prevent future foot problems. How to care for your feet Foot hygiene  Wash your feet daily with warm water and mild soap. Do not use hot water. Then, pat your feet and the areas between your toes until they are completely dry. Do not soak your feet as this can dry your skin.  Trim your toenails straight across. Do not dig under them or around the cuticle. File the edges of your nails with an emery board or nail file.  Apply a moisturizing lotion or petroleum jelly to the skin on your feet and to dry, brittle toenails. Use lotion that does not contain alcohol and is unscented. Do not apply lotion between your toes. Shoes and socks  Wear clean socks or stockings every day. Make sure they are not too tight. Do not wear knee-high stockings since they may decrease blood flow to your legs.  Wear shoes that fit properly and have enough cushioning. Always look in your shoes before you put them on to be sure there are no objects inside.  To break in new shoes, wear them for just a few hours a day. This prevents injuries on your feet. Wounds, scrapes, corns, and calluses  Check your feet daily for blisters, cuts, bruises, sores, and redness. If you cannot see the bottom of your feet, use a mirror or ask someone for help.  Do not cut corns or calluses or try to remove them with medicine.  If you  find a minor scrape, cut, or break in the skin on your feet, keep it and the skin around it clean and dry. You may clean these areas with mild soap and water. Do not clean the area with peroxide, alcohol, or iodine.  If you have a wound, scrape, corn, or callus on your foot, look at it several times a day to make sure it is healing and not infected. Check for: ? Redness, swelling, or pain. ? Fluid or blood. ? Warmth. ? Pus or a bad smell. General instructions  Do not cross your legs. This may decrease blood flow to your feet.  Do not use heating pads or hot water bottles on your feet. They may burn your skin. If you have lost feeling in your feet or legs, you may not know this is happening until it is too late.  Protect your feet from hot and cold by wearing shoes, such as at the beach or on hot pavement.  Schedule a complete foot exam at least once a year (annually) or more often if you have foot problems. If you have foot problems, report any cuts, sores, or bruises to your health care provider immediately. Contact a health care provider if:  You have a medical condition that increases your risk of infection and you have any cuts, sores, or bruises on your feet.  You have an injury that is not   healing.  You have redness on your legs or feet.  You feel burning or tingling in your legs or feet.  You have pain or cramps in your legs and feet.  Your legs or feet are numb.  Your feet always feel cold.  You have pain around a toenail. Get help right away if:  You have a wound, scrape, corn, or callus on your foot and: ? You have pain, swelling, or redness that gets worse. ? You have fluid or blood coming from the wound, scrape, corn, or callus. ? Your wound, scrape, corn, or callus feels warm to the touch. ? You have pus or a bad smell coming from the wound, scrape, corn, or callus. ? You have a fever. ? You have a red line going up your leg. Summary  Check your feet every day  for cuts, sores, red spots, swelling, and blisters.  Moisturize feet and legs daily.  Wear shoes that fit properly and have enough cushioning.  If you have foot problems, report any cuts, sores, or bruises to your health care provider immediately.  Schedule a complete foot exam at least once a year (annually) or more often if you have foot problems. This information is not intended to replace advice given to you by your health care provider. Make sure you discuss any questions you have with your health care provider. Document Revised: 06/19/2019 Document Reviewed: 10/28/2016 Elsevier Patient Education  2020 [employer]vier Inc.  

## 2019-12-10 ENCOUNTER — Telehealth: Payer: Self-pay

## 2019-12-10 ENCOUNTER — Other Ambulatory Visit: Payer: Self-pay

## 2019-12-10 DIAGNOSIS — I5023 Acute on chronic systolic (congestive) heart failure: Secondary | ICD-10-CM | POA: Diagnosis not present

## 2019-12-10 DIAGNOSIS — I0981 Rheumatic heart failure: Secondary | ICD-10-CM | POA: Diagnosis not present

## 2019-12-10 DIAGNOSIS — N1831 Chronic kidney disease, stage 3a: Secondary | ICD-10-CM | POA: Diagnosis not present

## 2019-12-10 DIAGNOSIS — I501 Left ventricular failure: Secondary | ICD-10-CM | POA: Diagnosis not present

## 2019-12-10 DIAGNOSIS — J449 Chronic obstructive pulmonary disease, unspecified: Secondary | ICD-10-CM | POA: Diagnosis not present

## 2019-12-10 DIAGNOSIS — I130 Hypertensive heart and chronic kidney disease with heart failure and stage 1 through stage 4 chronic kidney disease, or unspecified chronic kidney disease: Secondary | ICD-10-CM | POA: Diagnosis not present

## 2019-12-10 DIAGNOSIS — E1122 Type 2 diabetes mellitus with diabetic chronic kidney disease: Secondary | ICD-10-CM | POA: Diagnosis not present

## 2019-12-10 DIAGNOSIS — J9621 Acute and chronic respiratory failure with hypoxia: Secondary | ICD-10-CM | POA: Diagnosis not present

## 2019-12-10 DIAGNOSIS — N179 Acute kidney failure, unspecified: Secondary | ICD-10-CM | POA: Diagnosis not present

## 2019-12-10 MED ORDER — GABAPENTIN 300 MG PO CAPS: 300.0000 mg | ORAL_CAPSULE | Freq: Two times a day (BID) | ORAL | 3 refills | Status: DC

## 2019-12-10 MED ORDER — GABAPENTIN 300 MG PO CAPS: 300.0000 mg | ORAL_CAPSULE | Freq: Two times a day (BID) | ORAL | 0 refills | Status: DC

## 2019-12-10 NOTE — Telephone Encounter (Signed)
Transition Care Management Follow-up Telephone Call  Date of discharge and from where: 12/08/19 [facility]   How have you been since you were released from the hospital? Improved   Any questions or concerns? No   Items Reviewed:  Did the pt receive and understand the discharge instructions provided? Yes   Medications obtained and verified? Yes   Any new allergies since your discharge? No   Dietary orders reviewed? Yes  Do you have support at home? Yes   Other (ie: DME, Home Health, etc) currently on [facility] services   Functional Questionnaire: (I = Independent and D = Dependent) ADL's: I  Bathing/Dressing- I   Meal Prep- I  Eating- I  Maintaining continence- I  Transferring/Ambulation- I  Managing Meds- I   Follow up appointments reviewed:    PCP Hospital f/u appt confirmed? Yes  Scheduled to see Dr. Yong Channel on 12/13/19 @ 1.  [facility] f/u appt confirmed? Patient will schedule follow up with Cardiology    Are transportation arrangements needed? No   If their condition worsens, is the pt aware to call  their PCP or go to the ED? Yes  Was the patient provided with contact information for the PCP's office or ED? Yes  Was the pt encouraged to call back with questions or concerns? Yes

## 2019-12-10 NOTE — Telephone Encounter (Signed)
Noted thanks °

## 2019-12-11 ENCOUNTER — Telehealth: Payer: Self-pay | Admitting: Family Medicine

## 2019-12-11 ENCOUNTER — Other Ambulatory Visit: Payer: Self-pay

## 2019-12-11 NOTE — Telephone Encounter (Signed)
Nurse from [facility] called requesting verbal orders on pt. Pt refused occupational therapy. Requesting nurse to monitor heart failure, diabetes, & medications. Nurse frequency of 2 times 1 week, 1 time for 4 weeks, skip a week, 1 time for 1 week, skip a week, 1 time for 1 week. Requesting orders for PT to focus on gait, stability, & strength. PT eval is pending. Nurse also asked about metformin. Pt has been taking 1000 mg in the morning and 500 mg at night which is different from instructions on prescription. Please advise.

## 2019-12-11 NOTE — Patient Outreach (Signed)
[facility]) Care Management  12/11/2019  [name] [birth year] [id number]    EMMI-General Discharge RED ON EMMI ALERT Day # 1 Date: 12/10/2019 Red Alert Reason: "Scheduled follow-up? No  Unfilled prescriptions? Yes"   Outreach attempt # 1 to patient. Spoke with spouse(DPR on file) as she reports patient is still asleep. Reviewed and addressed red alerts with spouse. RN CM confirmed that patient spoke with MD office yesterday and was advised of PCP appt on 12/13/2019. Spouse reports no issues or concerns regarding meds. Note form TOC call from staff at PCP office office confirms no issues with meds. Advised spouse that they would get one more automated EMMI-GENERAL post discharge calls to assess how they are doing following recent hospitalization and will receive a call from a nurse if any of their responses were abnormal. Spouse voiced understanding and was appreciative of f/u call.    Plan: RN CM will close case at this time.  Enzo Montgomery, RN,BSN,CCM [facility] Telephonic Care Management Coordinator Direct Phone: [phone] Toll Free: [phone] Fax: [phone]

## 2019-12-12 ENCOUNTER — Other Ambulatory Visit: Payer: Self-pay

## 2019-12-12 DIAGNOSIS — I0981 Rheumatic heart failure: Secondary | ICD-10-CM | POA: Diagnosis not present

## 2019-12-12 DIAGNOSIS — I501 Left ventricular failure: Secondary | ICD-10-CM | POA: Diagnosis not present

## 2019-12-12 DIAGNOSIS — J9621 Acute and chronic respiratory failure with hypoxia: Secondary | ICD-10-CM | POA: Diagnosis not present

## 2019-12-12 DIAGNOSIS — E1122 Type 2 diabetes mellitus with diabetic chronic kidney disease: Secondary | ICD-10-CM | POA: Diagnosis not present

## 2019-12-12 DIAGNOSIS — N1831 Chronic kidney disease, stage 3a: Secondary | ICD-10-CM | POA: Diagnosis not present

## 2019-12-12 DIAGNOSIS — I5023 Acute on chronic systolic (congestive) heart failure: Secondary | ICD-10-CM | POA: Diagnosis not present

## 2019-12-12 DIAGNOSIS — I130 Hypertensive heart and chronic kidney disease with heart failure and stage 1 through stage 4 chronic kidney disease, or unspecified chronic kidney disease: Secondary | ICD-10-CM | POA: Diagnosis not present

## 2019-12-12 DIAGNOSIS — N179 Acute kidney failure, unspecified: Secondary | ICD-10-CM | POA: Diagnosis not present

## 2019-12-12 DIAGNOSIS — J449 Chronic obstructive pulmonary disease, unspecified: Secondary | ICD-10-CM | POA: Diagnosis not present

## 2019-12-12 MED ORDER — GABAPENTIN 300 MG PO CAPS: 300.0000 mg | ORAL_CAPSULE | Freq: Two times a day (BID) | ORAL | 3 refills | Status: DC

## 2019-12-12 NOTE — Progress Notes (Signed)
Subjective: [name] presents today for follow up of preventative diabetic foot care and callus(es) plantar aspect right foot and painful mycotic toenails b/l that are difficult to trim. Pain interferes with ambulation. Aggravating factors include wearing enclosed shoe gear. Pain is relieved with periodic professional debridement.   He states he missed his last appointment and has had other health issues. His last visit was in July 2020, which concerns me as well. He has had ulceration of callused area before. He denies any redness, drainage or swelling of callus on plantar aspect right foot.  No Known Allergies   Objective: There were no vitals filed for this visit.  Vascular Examination:  Capillary fill time to digits <3s b/l. Palpable DP pulses b/l. Palpable PT pulses b/l. Pedal hair absent b/l Skin temperature gradient within normal limits b/l.  Dermatological Examination: Pedal skin with normal turgor, texture and tone bilaterally. No open wounds bilaterally. No interdigital macerations bilaterally. Toenails 1-5 b/l elongated, dystrophic, thickened, crumbly with subungual debris and tenderness to dorsal palpation. Hyperkeratotic lesion(s) submet head 1 right foot. This is location of previous ulceration. There is subdermal hemorrhage present. No perilesion erythema, no edema, no drainage,no flocculence.Marland Kitchen  No erythema, no edema, no drainage, no flocculence.  Musculoskeletal: Normal muscle strength 5/5 to all lower extremity muscle groups bilaterally, no pain crepitus or joint limitation noted with ROM b/l and hammertoes noted to the  1-5 bilaterally  Neurological: Protective sensation absent with 10g monofilament b/l and vibratory sensation absent b/l  Assessment: 1. Pain due to onychomycosis of toenails of both feet   2. Callus   3. Type II diabetes mellitus with neurological manifestations ([place])    Plan: -Continue diabetic foot care principles. Literature dispensed on today.   -Toenails 1-5 b/l were debrided in length and girth with sterile nail nippers and dremel without iatrogenic bleeding.  -Calluses were debrided without complication or incident. Total number debrided =1 submet head 1 right foot. -Patient to continue soft, supportive shoe gear daily. -Patient to report any pedal injuries to medical professional immediately. -We will place him back on his 9 week appointments. -Patient/POA to call should there be question/concern in the interim.  Return in about 9 weeks (around 02/10/2020) for diabetic nail and callus trim.

## 2019-12-12 NOTE — Telephone Encounter (Signed)
Called [facility] back and provided VO and clarified Rx.

## 2019-12-13 ENCOUNTER — Ambulatory Visit (INDEPENDENT_AMBULATORY_CARE_PROVIDER_SITE_OTHER): Payer: Medicare HMO | Admitting: Family Medicine

## 2019-12-13 ENCOUNTER — Encounter: Payer: Self-pay | Admitting: Family Medicine

## 2019-12-13 VITALS — BP 100/58 | HR 86 | Temp 98.1°F | Ht 72.0 in | Wt 193.8 lb

## 2019-12-13 DIAGNOSIS — F3342 Major depressive disorder, recurrent, in full remission: Secondary | ICD-10-CM

## 2019-12-13 DIAGNOSIS — I10 Essential (primary) hypertension: Secondary | ICD-10-CM | POA: Diagnosis not present

## 2019-12-13 DIAGNOSIS — I700 Atherosclerosis of aorta: Secondary | ICD-10-CM

## 2019-12-13 DIAGNOSIS — D696 Thrombocytopenia, unspecified: Secondary | ICD-10-CM | POA: Diagnosis not present

## 2019-12-13 DIAGNOSIS — I5022 Chronic systolic (congestive) heart failure: Secondary | ICD-10-CM | POA: Diagnosis not present

## 2019-12-13 DIAGNOSIS — E1142 Type 2 diabetes mellitus with diabetic polyneuropathy: Secondary | ICD-10-CM | POA: Diagnosis not present

## 2019-12-13 DIAGNOSIS — J849 Interstitial pulmonary disease, unspecified: Secondary | ICD-10-CM

## 2019-12-13 DIAGNOSIS — J449 Chronic obstructive pulmonary disease, unspecified: Secondary | ICD-10-CM

## 2019-12-13 DIAGNOSIS — N183 Chronic kidney disease, stage 3 unspecified: Secondary | ICD-10-CM

## 2019-12-13 DIAGNOSIS — K7030 Alcoholic cirrhosis of liver without ascites: Secondary | ICD-10-CM

## 2019-12-13 LAB — COMPREHENSIVE METABOLIC PANEL
ALT: 25 U/L (ref 0–53)
AST: 25 U/L (ref 0–37)
Albumin: 3.6 g/dL (ref 3.5–5.2)
Alkaline Phosphatase: 99 U/L (ref 39–117)
BUN: 17 mg/dL (ref 6–23)
CO2: 26 mEq/L (ref 19–32)
Calcium: 9.1 mg/dL (ref 8.4–10.5)
Chloride: 106 mEq/L (ref 96–112)
Creatinine, Ser: 1.28 mg/dL (ref 0.40–1.50)
GFR: 54.23 mL/min — ABNORMAL LOW (ref >60.00)
Glucose, Bld: 137 mg/dL — ABNORMAL HIGH (ref 70–99)
Potassium: 4.4 mEq/L (ref 3.5–5.1)
Sodium: 142 mEq/L (ref 135–145)
Total Bilirubin: 0.4 mg/dL (ref 0.2–1.2)
Total Protein: 6.4 g/dL (ref 6.0–8.3)

## 2019-12-13 LAB — CBC WITH DIFFERENTIAL/PLATELET
Basophils Absolute: 0.1 10*3/uL (ref 0.0–0.1)
Basophils Relative: 1 % (ref 0.0–3.0)
Eosinophils Absolute: 0.1 10*3/uL (ref 0.0–0.7)
Eosinophils Relative: 1.8 % (ref 0.0–5.0)
HCT: 32.5 % — ABNORMAL LOW (ref 39.0–52.0)
Hemoglobin: 11.2 g/dL — ABNORMAL LOW (ref 13.0–17.0)
Lymphocytes Relative: 28.7 % (ref 12.0–46.0)
Lymphs Abs: 1.7 10*3/uL (ref 0.7–4.0)
MCHC: 34.4 g/dL (ref 30.0–36.0)
MCV: 100.3 fl — ABNORMAL HIGH (ref 78.0–100.0)
Monocytes Absolute: 0.3 10*3/uL (ref 0.1–1.0)
Monocytes Relative: 5.5 % (ref 3.0–12.0)
Neutro Abs: 3.7 10*3/uL (ref 1.4–7.7)
Neutrophils Relative %: 63 % (ref 43.0–77.0)
Platelets: 134 10*3/uL — ABNORMAL LOW (ref 150.0–400.0)
RBC: 3.24 Mil/uL — ABNORMAL LOW (ref 4.22–5.81)
RDW: 15.6 % — ABNORMAL HIGH (ref 11.5–15.5)
WBC: 5.9 10*3/uL (ref 4.0–10.5)

## 2019-12-13 MED ORDER — ESCITALOPRAM OXALATE 5 MG PO TABS: 5.0000 mg | ORAL_TABLET | Freq: Every day | ORAL | 5 refills | Status: DC

## 2019-12-13 NOTE — Progress Notes (Signed)
Phone [phone]   Subjective:  [name] is a 79 y.o. year old very pleasant male patient who presents for transitional care management and hospital follow up for systolic CHMF. Patient was hospitalized from 12/03/19 to 12/08/19. A TCM phone call was completed on 12/10/19. Medical complexity high.  I reviewed hospital discharge summary and summarized below.  79 year old male admitted to the hospital for chronic systolic heart failure exacerbation complicated by bilateral pleural effusions and cardiogenic pulmonary edema as well as acute kidney injury  Prior hospitalization patient had 5 days of shortness of breath that was progressive in nature and worse with minimal exertion.  Initial respiratory rate elevated at 20 with oxygen saturations at 93% and on exam had decreased air entry bilaterally.  COVID-19 testing was negative.  Chest x-ray showed bilateral hilar congestion and small bilateral pleural effusions.  CT of the chest showed bilateral pleural effusions negative for pulmonary embolism.  Patient was listed as having acute hypoxic respiratory failure which improved before discharge-patient was sent home on home oxygen as he had a desaturation at nighttime into the 80s. Patient states he never received home oxygen but has monitoring his levels and has not had issues- has even checked at night .Ultimately patient had improvement in symptoms with aggressive IV diuresis.  Echocardiogram showed significant reduction in ejection fraction down to 20 to 25% with no significant valvular disease.  He did have global hypokinesis of the left ventricle.  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  Home health services were arranged.  Patient reports they have come out once- they are working to get him stronger   Per note patient developed cardiorenal syndrome with worsening renal function which improved before discharge.  His peak creatinine was up to 2.1 but had improved by time of  discharge back to 1.38.  He was discharged on Lasix 20 mg daily with plan for outpatient BMP.  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  Ultrasound the abdomen was evaluated and shows cirrhosis but otherwise reassuring exam  Hypertension-patient had low blood pressure due to diuresis and home blood pressure medicines were held-he was discharged home on lisinopril as well as Lasix.  His amlodipine was permanently discontinued. He denies feeling lightheaded or dizzy.  Patient was started on coreg as well   Diabetes-A1c was at 5.9-patient was discharged home on Metformin  Lab Results  Component Value Date   HGBA1C 5.9 (A) 11/07/2019   HGBA1C 6.6 (A) 02/22/2019   HGBA1C 7.1 (H) 10/22/2018   Depression-patient was continued on amitriptyline (has had some falls in past and reports confusion at time so we discussed stopping).  For BPH patient was continued on finasteride and tamsulosin without signs of urinary retention.  He was continued on his home statin for hyperlipidemia  Please note I independently reviewed the following images 1.  Chest x-ray December 03, 2019-bilateral small pleural effusions with some patchy airspace disease primarily at left lung base.  Mild cardiomegaly.  Aortic atherosclerosis was noted.  No obvious pneumothorax.  No obvious bony abnormalities 2.  CT angiogram chest December 03, 2019-no obvious pulmonary embolism noted.  Bilateral pleural effusions appear larger than on chest x-ray.  Interstitial markings considering for pulmonary edema.  Mild cardiomegaly.  Aortic and coronary atherosclerosis  was noted.  Emphysematous changes noted.  Of visualized portion of liver cirrhosis was noted.  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 Off narcotics 06/05/17 due to negative UDS for opiates x2. Full history 06/12/14. Pain contract signed.  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[place]) 04/06/2007    Priority: High   B12 deficiency 08/20/2015    Priority: Medium   Diabetic polyneuropathy associated with type 2 diabetes mellitus ([place]) 08/07/2015    Priority: Medium   CKD (chronic kidney disease), stage III 05/06/2015    Priority: Medium   Fatty liver 11/04/2014    Priority: Medium   BPH (benign prostatic hyperplasia) 06/20/2007    Priority: Medium   Depression 05/02/2007    Priority: Medium   Hyperlipidemia associated with type 2 diabetes mellitus ([place]) 04/06/2007    Priority: Medium   Essential hypertension 04/06/2007    Priority: Medium   Osteoarthritis 04/06/2007    Priority: Medium   Aortic atherosclerosis ([place]) 12/14/2019    Priority: Low   GERD (gastroesophageal reflux disease) 09/30/2017    Priority: Low   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 positive 03/29/2016    Priority: Low   Candidal balanoposthitis 06/20/2014    Priority: Low   Diabetic ulcer of right midfoot associated with diabetes mellitus due to underlying condition, with fat layer exposed ([place]) 11/07/2019    Anemia 03/05/2018   Diplopia 06/05/2015   PCO (posterior capsular opacification) 06/19/2013   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 Current Outpatient Medications  Medication Sig Dispense Refill   acyclovir (ZOVIRAX) 400 MG tablet TAKE 1 TABLET TWICE DAILY (Patient taking differently: Take 400 mg by mouth 2 (two) times daily. ) 180 tablet 1   albuterol (VENTOLIN HFA) 108 (90 Base) MCG/ACT inhaler Inhale 2 puffs into the lungs every 6 (six) hours as needed for wheezing or shortness of breath. 6.7 g 3   amitriptyline (ELAVIL) 50 MG tablet TAKE 1 TABLET AT BEDTIME (Patient taking differently: Take 50 mg by mouth at bedtime. ) 90 tablet 1   ASCORBIC ACID PO Take 1 tablet by mouth daily.      Blood Glucose Monitoring Suppl (TRUE METRIX AIR GLUCOSE METER) w/Device KIT      carvedilol (COREG) 3.125 MG tablet Take 1 tablet (3.125 mg total) by mouth 2 (two) times daily with a meal. 60 tablet 0   Cholecalciferol 1000 UNITS capsule Take 1 capsule (1,000 Units total) by mouth 2 (two) times daily.     clotrimazole-betamethasone (LOTRISONE) cream Apply 1 application topically 2 (two) times  daily. For 7 days maximum for severe jock itch . Stop if worsening symptoms. 45 g 1   famotidine (PEPCID) 20 MG tablet TAKE 1 TABLET TWICE DAILY (Patient taking differently: Take 20 mg by mouth daily. ) 180 tablet 1   finasteride (PROSCAR) 5 MG tablet TAKE 1 TABLET EVERY DAY (Patient taking differently: Take 5 mg by mouth daily. ) 90 tablet 1   furosemide (LASIX) 20 MG tablet Take 1 tablet (20 mg total) by mouth daily. 30 tablet 0   gabapentin (NEURONTIN) 300 MG capsule Take 1 capsule (300 mg total) by mouth 2 (two) times daily. 180 capsule 3    glucose blood (TRUE METRIX BLOOD GLUCOSE TEST) test strip TEST BLOOD SUGAR EVERY DAY 100 strip 1   lisinopril (ZESTRIL) 2.5 MG tablet Take 1 tablet (2.5 mg total) by mouth daily. 90 tablet 1   metFORMIN (GLUCOPHAGE) 500 MG tablet TAKE 1 TABLET EVERY DAY WITH BREAKFAST (Patient taking differently: Take 500 mg by mouth every evening. ) 90 tablet 3   Multiple Vitamin (MULTIVITAMIN) capsule Take 1 capsule by mouth daily.       NONFORMULARY OR COMPOUNDED ITEM [facility]  Peripheral Neuropathy Cream- Bupivacaine 1%, Doxepin 3%, Gabapentin 6%, Pentoxifylline 3%, Topiramate 1% Apply 1-2 grams to affected area 3-4 times daily Qty. 120 gm 3 refills     omeprazole (PRILOSEC) 20 MG capsule TAKE 1 CAPSULE DAILY 30 TO 60 MINUTES BEFORE FIRST MEAL OF THE DAY (Patient taking differently: Take 20 mg by mouth daily. ) 90 capsule 1   rosuvastatin (CRESTOR) 10 MG tablet TAKE 1 TABLET EVERY DAY (Patient taking differently: Take 10 mg by mouth daily. ) 90 tablet 3   sodium chloride (MURO 128) 5 % ophthalmic solution Place 1 drop into the left eye at bedtime.     tamsulosin (FLOMAX) 0.4 MG CAPS capsule TAKE 1 CAPSULE EVERY DAY (Patient taking differently: Take 0.4 mg by mouth daily. ) 90 capsule 1   TRUEPLUS LANCETS 28G MISC Use to test blood sugars two times daily. Dx: E11.9 200 each 3   No current facility-administered medications for this visit.   Objective  Objective:  BP (!) 100/58    Pulse 86    Temp 98.1 F (36.7 C) (Temporal)    Ht 6' (1.829 m)    Wt 193 lb 12.8 oz (87.9 kg)    SpO2 94%    BMI 26.28 kg/m  Gen: NAD, resting comfortably CV: RRR no murmurs rubs or gallops Lungs: CTAB no crackles, wheeze, rhonchi Abdomen: soft/nontender/nondistended/normal bowel sounds. No rebound or guarding.  Ext: trace edema Skin: warm, dry    Assessment and Plan:   #Hospital follow-up/TCM  Chronic systolic heart failure [facility]) Hospital follow-up for 79 year old male admitted to the hospital for  chronic systolic heart failure exacerbation complicated by bilateral pleural effusions and cardiogenic pulmonary edema as well as acute kidney injury.  Ejection fraction was as low as 20 to 25% -Patient appears to be doing well/improved fluid status on Lasix 20 mg daily.  Update renal function today-did have acute worsening while hospitalized but thrilled this has improved with what appears to be back to baseline -Blood pressure is low normal but patient symptomatically is not bothered by this-we will continue this to allow for him to be on carvedilol, lisinopril, Lasix considering severity of heart failure -I placed a referral for cardiology follow-up as does not appear this was set up before discharge -Patient was requiring oxygen in the hospital but has not required at  home -Patient and I discussed indications to increase Lasix and also to contact us immediately if he needs to do so  CKD (chronic kidney disease), stage III Had acute on chronic kidney injury but appears to have improved back to baseline.  Should continue to avoid NSAIDs.  we will monitor renal function each visit especially with him being on Lasix now and low normal blood pressure  DM (diabetes mellitus) type II controlled, neurological manifestation (HCC)  Well-controlled with Metformin alone-we will continue current low-dose 500 mg daily for now Lab Results  Component Value Date   HGBA1C 5.9 (A) 11/07/2019   HGBA1C 6.6 (A) 02/22/2019   HGBA1C 7.1 (H) 10/22/2018      COPD (chronic obstructive pulmonary disease) ([place]) Patient followed by pulmonology in the past.  At present primarily only using albuterol.  Patient also has interstitial lung disease listed by pulmonology in the past with broad differential.  We previously had attributed shortness of breath to pulmonary issues but now in light of heart failure I wonder if that has been contributing for some time -For now continue as needed albuterol as appears  stable  Thrombocytopenia (HCC) Macrocytosis and thrombocytopenia followed by hematology-has upcoming visit on March 11.  Appears stable on labs  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[place]) Noted on CT imaging while hospitalized.  Patient has been alcohol free for many years but this is likely related to prior damage.  We previously had fatty liver listed.  LFTs are normal thankfully without obvious ascites.  We will continue to monitor as appears stable Hepatic Function Latest Ref Rng & Units 12/13/2019 12/04/2019 12/04/2019  Total Protein 6.0 - 8.3 g/dL 6.4 6.6 6.3(L)  Albumin 3.5 - 5.2 g/dL 3.6 3.4(L) 3.4(L)  AST 0 - 37 U/L '25 27 26  ' ALT 0 - 53 U/L '25 15 15  ' Alk Phosphatase 39 - 117 U/L 99 80 79  Total Bilirubin 0.2 - 1.2 mg/dL 0.4 1.0 0.9  Bilirubin, Direct 0.0 - 0.2 mg/dL - 0.2 -  -We will plan on liver ultrasound every 6 months-start September 2021 which would be 6 months out from most recent imaging  Essential hypertension Patient started on 3 medications for CHF.  Amlodipine was stopped also related to CHF.  Blood pressure is well controlled-perhaps overcontrolled but patient does need these medicines from CHF perspective-he is tolerating current regimen without issues so we decided to continue-Lisinopril 2.5 mg, Lasix 20 mg, carvedilol 3.125 mg twice a day  Depression In the past Prozac 20 mg-patient stopped for unclear reasons-may have simply forgot In the past as of March 2021-stop amitriptyline due to patient reports of intermittent confusion and risk of falls  Depression appears well controlled on amitriptyline alone but since we are going to be stopping this as above-we opted to start low-dose Lexapro 5 mg-he will let us know if he has any worsening symptoms of depression  Aortic atherosclerosis ([place]) Noted on CT angiogram February 2021.  Main treatment is risk factor modification-see diabetes, hypertension.  Patient is also on rosuvastatin  Also from CTA "Lobulated renal  parenchyma versus less likely left renal mass. Further evaluation with ultrasound or CT with IV contrast on a nonemergent/outpatient basis recommended." -This was noted after visit with patient and we will discuss this at next visit and likely obtain imaging but will have to monitor renal function closely  Recommended follow up: Return in about 1 month (around 01/13/2020). Future Appointments  Date Time Provider [place]  12/19/2019  2:30 PM  [facility] 5 [facility] None  12/19/2019  3:00 PM Brunetta Genera, MD [facility] None  01/22/2020  1:20 PM [name], MD [facility]  02/18/2020  4:00 PM Marzetta Board, DPM [facility]    Lab/Order associations:   ICD-10-CM   1. Chronic systolic heart failure (HCC)  I50.22 Ambulatory referral to Cardiology    CBC with Differential/Platelet    Comprehensive metabolic panel  2. Stage 3 chronic kidney disease, unspecified whether stage 3a or 3b CKD  N18.30   3. Controlled type 2 diabetes mellitus with diabetic polyneuropathy, without long-term current use of insulin (HCC)  E11.42   4. Chronic obstructive pulmonary disease, unspecified COPD type ([place])  J44.9   5. ILD (interstitial lung disease) ([place])  J84.9   6. Thrombocytopenia ([place])  D69.6   7. 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HCC)  K70.30   8. Essential hypertension  I10   9. Recurrent major depressive disorder, in full remission ([place])  F33.42   10. Aortic atherosclerosis (HCC)  I70.0     Meds ordered this encounter  Medications   escitalopram (LEXAPRO) 5 MG tablet    Sig: Take 1 tablet (5 mg total) by mouth daily.    Dispense:  90 tablet    Refill:  5    Return precautions advised.  [name], MD

## 2019-12-13 NOTE — Patient Instructions (Addendum)
Health Maintenance Due  Topic Date Due  . TETANUS/TDAP - consider getting this at your pharmacy 06/10/2016  . OPHTHALMOLOGY EXAM - team please help either get a copy of this or set him up for next eye exam 01/25/2019    When you get lexapro/escitalopram in the mail- take an amitriptyline that night then the next morning take escitalopram/lexapro 5 mg then permanently discontinue the amitriptyline   We will call you within two weeks about your referral to cardiology/heart doctors- they wanted to see you  Weeks after you got out of the hospital  Make sure you are taking carvedilol/coreg instead of metoprolol now  Instructed take furosemide/lasix twice daily (separated by 6 hours) in case of recurrent shortness of breath, leg swelling or weight gain 2 lbs in 24 hours or 5 lbs in 7 days. This is to make sure you don't get too much fluid build up   Please stop by lab before you go If you do not have mychart- we will call you about results within 5 business days of [place] receiving them.  If you have mychart- we will send your results within 3 business days of [place] receiving them.  If abnormal or we want to clarify a result, we will call or mychart you to make sure you receive the message.  If you have questions or concerns or don't hear within 5 business days, please send [place] a message or call us.   Recommended follow up: Return in about 1- 1.5 month (around 01/13/2020 to 01/28/20).

## 2019-12-14 DIAGNOSIS — K7030 Alcoholic cirrhosis of liver without ascites: Secondary | ICD-10-CM | POA: Insufficient documentation

## 2019-12-14 DIAGNOSIS — I700 Atherosclerosis of aorta: Secondary | ICD-10-CM | POA: Insufficient documentation

## 2019-12-14 NOTE — Assessment & Plan Note (Signed)
In the past Prozac 20 mg-patient stopped for unclear reasons-may have simply forgot In the past as of March 2021-stop amitriptyline due to patient reports of intermittent confusion and risk of falls  Depression appears well controlled on amitriptyline alone but since we are going to be stopping this as above-we opted to start low-dose Lexapro 5 mg-he will let us know if he has any worsening symptoms of depression

## 2019-12-14 NOTE — Assessment & Plan Note (Signed)
Macrocytosis and thrombocytopenia followed by hematology-has upcoming visit on March 11.  Appears stable on labs

## 2019-12-14 NOTE — Assessment & Plan Note (Addendum)
Noted on CT imaging while hospitalized.  Patient has been alcohol free for many years but this is likely related to prior damage.  We previously had fatty liver listed.  LFTs are normal thankfully without obvious ascites.  We will continue to monitor as appears stable Hepatic Function Latest Ref Rng & Units 12/13/2019 12/04/2019 12/04/2019  Total Protein 6.0 - 8.3 g/dL 6.4 6.6 6.3(L)  Albumin 3.5 - 5.2 g/dL 3.6 3.4(L) 3.4(L)  AST 0 - 37 U/L '25 27 26  ' ALT 0 - 53 U/L '25 15 15  ' Alk Phosphatase 39 - 117 U/L 99 80 79  Total Bilirubin 0.2 - 1.2 mg/dL 0.4 1.0 0.9  Bilirubin, Direct 0.0 - 0.2 mg/dL - 0.2 -  -We will plan on liver ultrasound every 6 months-start September 2021 which would be 6 months out from most recent imaging

## 2019-12-14 NOTE — Assessment & Plan Note (Addendum)
Patient followed by pulmonology in the past.  At present primarily only using albuterol.  Patient also has interstitial lung disease listed by pulmonology in the past with broad differential.  We previously had attributed shortness of breath to pulmonary issues but now in light of heart failure I wonder if that has been contributing for some time -For now continue as needed albuterol as appears stable

## 2019-12-14 NOTE — Assessment & Plan Note (Signed)
Well-controlled with Metformin alone-we will continue current low-dose 500 mg daily for now Lab Results  Component Value Date   HGBA1C 5.9 (A) 11/07/2019   HGBA1C 6.6 (A) 02/22/2019   HGBA1C 7.1 (H) 10/22/2018

## 2019-12-14 NOTE — Assessment & Plan Note (Signed)
Noted on CT angiogram February 2021.  Main treatment is risk factor modification-see diabetes, hypertension.  Patient is also on rosuvastatin

## 2019-12-14 NOTE — Assessment & Plan Note (Signed)
Patient started on 3 medications for CHF.  Amlodipine was stopped also related to CHF.  Blood pressure is well controlled-perhaps overcontrolled but patient does need these medicines from CHF perspective-he is tolerating current regimen without issues so we decided to continue-Lisinopril 2.5 mg, Lasix 20 mg, carvedilol 3.125 mg twice a day

## 2019-12-14 NOTE — Assessment & Plan Note (Addendum)
Hospital follow-up for 79 year old male admitted to the hospital for chronic systolic heart failure exacerbation complicated by bilateral pleural effusions and cardiogenic pulmonary edema as well as acute kidney injury.  Ejection fraction was as low as 20 to 25% -Patient appears to be doing well/improved fluid status on Lasix 20 mg daily.  Update renal function today-did have acute worsening while hospitalized but thrilled this has improved with what appears to be back to baseline -Blood pressure is low normal but patient symptomatically is not bothered by this-we will continue this to allow for him to be on carvedilol, lisinopril, Lasix considering severity of heart failure -I placed a referral for cardiology follow-up as does not appear this was set up before discharge -Patient was requiring oxygen in the hospital but has not required at home -Patient and I discussed indications to increase Lasix and also to contact us immediately if he needs to do so

## 2019-12-14 NOTE — Assessment & Plan Note (Signed)
Had acute on chronic kidney injury but appears to have improved back to baseline.  Should continue to avoid NSAIDs.  we will monitor renal function each visit especially with him being on Lasix now and low normal blood pressure

## 2019-12-16 DIAGNOSIS — E1122 Type 2 diabetes mellitus with diabetic chronic kidney disease: Secondary | ICD-10-CM | POA: Diagnosis not present

## 2019-12-16 DIAGNOSIS — N1831 Chronic kidney disease, stage 3a: Secondary | ICD-10-CM | POA: Diagnosis not present

## 2019-12-16 DIAGNOSIS — J449 Chronic obstructive pulmonary disease, unspecified: Secondary | ICD-10-CM | POA: Diagnosis not present

## 2019-12-16 DIAGNOSIS — I130 Hypertensive heart and chronic kidney disease with heart failure and stage 1 through stage 4 chronic kidney disease, or unspecified chronic kidney disease: Secondary | ICD-10-CM | POA: Diagnosis not present

## 2019-12-16 DIAGNOSIS — I5023 Acute on chronic systolic (congestive) heart failure: Secondary | ICD-10-CM | POA: Diagnosis not present

## 2019-12-16 DIAGNOSIS — N179 Acute kidney failure, unspecified: Secondary | ICD-10-CM | POA: Diagnosis not present

## 2019-12-16 DIAGNOSIS — I0981 Rheumatic heart failure: Secondary | ICD-10-CM | POA: Diagnosis not present

## 2019-12-16 DIAGNOSIS — I501 Left ventricular failure: Secondary | ICD-10-CM | POA: Diagnosis not present

## 2019-12-16 DIAGNOSIS — J9621 Acute and chronic respiratory failure with hypoxia: Secondary | ICD-10-CM | POA: Diagnosis not present

## 2019-12-17 DIAGNOSIS — I0981 Rheumatic heart failure: Secondary | ICD-10-CM | POA: Diagnosis not present

## 2019-12-17 DIAGNOSIS — J9621 Acute and chronic respiratory failure with hypoxia: Secondary | ICD-10-CM | POA: Diagnosis not present

## 2019-12-17 DIAGNOSIS — N179 Acute kidney failure, unspecified: Secondary | ICD-10-CM | POA: Diagnosis not present

## 2019-12-17 DIAGNOSIS — I130 Hypertensive heart and chronic kidney disease with heart failure and stage 1 through stage 4 chronic kidney disease, or unspecified chronic kidney disease: Secondary | ICD-10-CM | POA: Diagnosis not present

## 2019-12-17 DIAGNOSIS — I5023 Acute on chronic systolic (congestive) heart failure: Secondary | ICD-10-CM | POA: Diagnosis not present

## 2019-12-17 DIAGNOSIS — I501 Left ventricular failure: Secondary | ICD-10-CM | POA: Diagnosis not present

## 2019-12-17 DIAGNOSIS — J449 Chronic obstructive pulmonary disease, unspecified: Secondary | ICD-10-CM | POA: Diagnosis not present

## 2019-12-17 DIAGNOSIS — N1831 Chronic kidney disease, stage 3a: Secondary | ICD-10-CM | POA: Diagnosis not present

## 2019-12-17 DIAGNOSIS — E1122 Type 2 diabetes mellitus with diabetic chronic kidney disease: Secondary | ICD-10-CM | POA: Diagnosis not present

## 2019-12-18 DIAGNOSIS — I501 Left ventricular failure: Secondary | ICD-10-CM | POA: Diagnosis not present

## 2019-12-18 DIAGNOSIS — J449 Chronic obstructive pulmonary disease, unspecified: Secondary | ICD-10-CM | POA: Diagnosis not present

## 2019-12-18 DIAGNOSIS — I5023 Acute on chronic systolic (congestive) heart failure: Secondary | ICD-10-CM | POA: Diagnosis not present

## 2019-12-18 DIAGNOSIS — J9621 Acute and chronic respiratory failure with hypoxia: Secondary | ICD-10-CM | POA: Diagnosis not present

## 2019-12-18 DIAGNOSIS — I0981 Rheumatic heart failure: Secondary | ICD-10-CM | POA: Diagnosis not present

## 2019-12-18 DIAGNOSIS — N179 Acute kidney failure, unspecified: Secondary | ICD-10-CM | POA: Diagnosis not present

## 2019-12-18 DIAGNOSIS — N1831 Chronic kidney disease, stage 3a: Secondary | ICD-10-CM | POA: Diagnosis not present

## 2019-12-18 DIAGNOSIS — E1122 Type 2 diabetes mellitus with diabetic chronic kidney disease: Secondary | ICD-10-CM | POA: Diagnosis not present

## 2019-12-18 DIAGNOSIS — I130 Hypertensive heart and chronic kidney disease with heart failure and stage 1 through stage 4 chronic kidney disease, or unspecified chronic kidney disease: Secondary | ICD-10-CM | POA: Diagnosis not present

## 2019-12-19 ENCOUNTER — Inpatient Hospital Stay: Payer: Medicare HMO | Attending: Hematology

## 2019-12-19 ENCOUNTER — Inpatient Hospital Stay: Payer: Medicare HMO | Admitting: Hematology

## 2019-12-19 DIAGNOSIS — J449 Chronic obstructive pulmonary disease, unspecified: Secondary | ICD-10-CM | POA: Diagnosis not present

## 2019-12-24 DIAGNOSIS — I5023 Acute on chronic systolic (congestive) heart failure: Secondary | ICD-10-CM | POA: Diagnosis not present

## 2019-12-24 DIAGNOSIS — I130 Hypertensive heart and chronic kidney disease with heart failure and stage 1 through stage 4 chronic kidney disease, or unspecified chronic kidney disease: Secondary | ICD-10-CM | POA: Diagnosis not present

## 2019-12-24 DIAGNOSIS — I2584 Coronary atherosclerosis due to calcified coronary lesion: Secondary | ICD-10-CM

## 2019-12-24 DIAGNOSIS — Z9181 History of falling: Secondary | ICD-10-CM

## 2019-12-24 DIAGNOSIS — N1831 Chronic kidney disease, stage 3a: Secondary | ICD-10-CM

## 2019-12-24 DIAGNOSIS — K7460 Unspecified cirrhosis of liver: Secondary | ICD-10-CM

## 2019-12-24 DIAGNOSIS — K5790 Diverticulosis of intestine, part unspecified, without perforation or abscess without bleeding: Secondary | ICD-10-CM

## 2019-12-24 DIAGNOSIS — Z87891 Personal history of nicotine dependence: Secondary | ICD-10-CM

## 2019-12-24 DIAGNOSIS — F329 Major depressive disorder, single episode, unspecified: Secondary | ICD-10-CM

## 2019-12-24 DIAGNOSIS — I080 Rheumatic disorders of both mitral and aortic valves: Secondary | ICD-10-CM

## 2019-12-24 DIAGNOSIS — K760 Fatty (change of) liver, not elsewhere classified: Secondary | ICD-10-CM

## 2019-12-24 DIAGNOSIS — N179 Acute kidney failure, unspecified: Secondary | ICD-10-CM

## 2019-12-24 DIAGNOSIS — J9621 Acute and chronic respiratory failure with hypoxia: Secondary | ICD-10-CM

## 2019-12-24 DIAGNOSIS — I700 Atherosclerosis of aorta: Secondary | ICD-10-CM

## 2019-12-24 DIAGNOSIS — J9611 Chronic respiratory failure with hypoxia: Secondary | ICD-10-CM | POA: Diagnosis not present

## 2019-12-24 DIAGNOSIS — M1990 Unspecified osteoarthritis, unspecified site: Secondary | ICD-10-CM

## 2019-12-24 DIAGNOSIS — M545 Low back pain: Secondary | ICD-10-CM

## 2019-12-24 DIAGNOSIS — Z7984 Long term (current) use of oral hypoglycemic drugs: Secondary | ICD-10-CM

## 2019-12-24 DIAGNOSIS — R161 Splenomegaly, not elsewhere classified: Secondary | ICD-10-CM

## 2019-12-24 DIAGNOSIS — E1122 Type 2 diabetes mellitus with diabetic chronic kidney disease: Secondary | ICD-10-CM

## 2019-12-24 DIAGNOSIS — I501 Left ventricular failure: Secondary | ICD-10-CM | POA: Diagnosis not present

## 2019-12-24 DIAGNOSIS — E785 Hyperlipidemia, unspecified: Secondary | ICD-10-CM

## 2019-12-24 DIAGNOSIS — J449 Chronic obstructive pulmonary disease, unspecified: Secondary | ICD-10-CM | POA: Diagnosis not present

## 2019-12-24 DIAGNOSIS — Z7982 Long term (current) use of aspirin: Secondary | ICD-10-CM

## 2019-12-24 DIAGNOSIS — I0981 Rheumatic heart failure: Secondary | ICD-10-CM | POA: Diagnosis not present

## 2019-12-24 DIAGNOSIS — E1149 Type 2 diabetes mellitus with other diabetic neurological complication: Secondary | ICD-10-CM

## 2019-12-24 DIAGNOSIS — N400 Enlarged prostate without lower urinary tract symptoms: Secondary | ICD-10-CM

## 2019-12-24 DIAGNOSIS — Z86718 Personal history of other venous thrombosis and embolism: Secondary | ICD-10-CM

## 2019-12-24 DIAGNOSIS — E1169 Type 2 diabetes mellitus with other specified complication: Secondary | ICD-10-CM

## 2019-12-25 DIAGNOSIS — E1122 Type 2 diabetes mellitus with diabetic chronic kidney disease: Secondary | ICD-10-CM | POA: Diagnosis not present

## 2019-12-25 DIAGNOSIS — I501 Left ventricular failure: Secondary | ICD-10-CM | POA: Diagnosis not present

## 2019-12-25 DIAGNOSIS — I130 Hypertensive heart and chronic kidney disease with heart failure and stage 1 through stage 4 chronic kidney disease, or unspecified chronic kidney disease: Secondary | ICD-10-CM | POA: Diagnosis not present

## 2019-12-25 DIAGNOSIS — N179 Acute kidney failure, unspecified: Secondary | ICD-10-CM | POA: Diagnosis not present

## 2019-12-25 DIAGNOSIS — I5023 Acute on chronic systolic (congestive) heart failure: Secondary | ICD-10-CM | POA: Diagnosis not present

## 2019-12-25 DIAGNOSIS — I0981 Rheumatic heart failure: Secondary | ICD-10-CM | POA: Diagnosis not present

## 2019-12-25 DIAGNOSIS — N1831 Chronic kidney disease, stage 3a: Secondary | ICD-10-CM | POA: Diagnosis not present

## 2019-12-25 DIAGNOSIS — J449 Chronic obstructive pulmonary disease, unspecified: Secondary | ICD-10-CM | POA: Diagnosis not present

## 2019-12-25 DIAGNOSIS — J9621 Acute and chronic respiratory failure with hypoxia: Secondary | ICD-10-CM | POA: Diagnosis not present

## 2019-12-27 ENCOUNTER — Other Ambulatory Visit: Payer: Self-pay

## 2019-12-27 ENCOUNTER — Ambulatory Visit: Payer: Medicare HMO | Admitting: Cardiology

## 2019-12-27 ENCOUNTER — Encounter: Payer: Self-pay | Admitting: Cardiology

## 2019-12-27 VITALS — BP 130/72 | HR 93 | Ht 72.0 in | Wt 198.0 lb

## 2019-12-27 DIAGNOSIS — N179 Acute kidney failure, unspecified: Secondary | ICD-10-CM | POA: Diagnosis not present

## 2019-12-27 DIAGNOSIS — J9621 Acute and chronic respiratory failure with hypoxia: Secondary | ICD-10-CM | POA: Diagnosis not present

## 2019-12-27 DIAGNOSIS — I5022 Chronic systolic (congestive) heart failure: Secondary | ICD-10-CM | POA: Diagnosis not present

## 2019-12-27 DIAGNOSIS — I5023 Acute on chronic systolic (congestive) heart failure: Secondary | ICD-10-CM | POA: Diagnosis not present

## 2019-12-27 DIAGNOSIS — J449 Chronic obstructive pulmonary disease, unspecified: Secondary | ICD-10-CM | POA: Diagnosis not present

## 2019-12-27 DIAGNOSIS — I10 Essential (primary) hypertension: Secondary | ICD-10-CM | POA: Diagnosis not present

## 2019-12-27 DIAGNOSIS — N1831 Chronic kidney disease, stage 3a: Secondary | ICD-10-CM

## 2019-12-27 DIAGNOSIS — E1122 Type 2 diabetes mellitus with diabetic chronic kidney disease: Secondary | ICD-10-CM | POA: Diagnosis not present

## 2019-12-27 DIAGNOSIS — I501 Left ventricular failure: Secondary | ICD-10-CM | POA: Diagnosis not present

## 2019-12-27 DIAGNOSIS — I429 Cardiomyopathy, unspecified: Secondary | ICD-10-CM

## 2019-12-27 DIAGNOSIS — I0981 Rheumatic heart failure: Secondary | ICD-10-CM | POA: Diagnosis not present

## 2019-12-27 DIAGNOSIS — I130 Hypertensive heart and chronic kidney disease with heart failure and stage 1 through stage 4 chronic kidney disease, or unspecified chronic kidney disease: Secondary | ICD-10-CM | POA: Diagnosis not present

## 2019-12-27 MED ORDER — ENTRESTO 24-26 MG PO TABS: 1.0000 | ORAL_TABLET | Freq: Two times a day (BID) | ORAL | 6 refills | Status: DC

## 2019-12-27 NOTE — Patient Instructions (Addendum)
Medication Instructions:   Your physician has recommended you make the following change in your medication:   Stop lisinopril  Start entresto 24/26 mg by mouth twice daily on Monday, December 30, 2019  Take voucher to pharmacy for first 30 days free.  Continue all other medications the same  Labwork:  Your physician recommends that you return for non-fasting lab work in about 10 days of starting entresto,  to check your BMET. Go around 01/08/2020. You may have this done at [facility] or [facility] [phone] [place])  Testing/Procedures:  NONE  Follow-Up:  Your physician recommends that you schedule a follow-up appointment in: 3-4 weeks with [name] at our [place] office.   Any Other Special Instructions Will Be Listed Below (If Applicable).  If you need a refill on your cardiac medications before your next appointment, please call your pharmacy.

## 2019-12-27 NOTE — Progress Notes (Signed)
Cardiology Office Note  Date: 12/27/2019   ID: [name], DOB 1941/05/25, MRN [id number]  PCP:  [name], MD  Consulting Cardiologist:  Rozann Lesches, MD Electrophysiologist:  None   Chief Complaint  Patient presents with  . Cardiomyopathy    History of Present Illness: JATAVIUS ELLENWOOD is a 79 y.o. male referred for cardiology consultation by Dr. Yong Channel with history of cardiomyopathy and systolic heart failure. He is here today with his wife.  I reviewed his records.  He was hospitalized in February with progressive shortness of breath and hypoxic respiratory failure associated with significant dyspnea on exertion.  He tells me that symptoms had been gradually progressing for months to a year, but much worse in the week prior to his presentation.  He was found to have moderate, bilateral pleural effusions by chest CT pulmonary embolus, multivessel distribution coronary calcification.  Discharge summary indicates improvement with IV diuresis.  Echocardiogram  reported LVEF 20 to 25% range with no evidence of LV mural thrombus, normal RV contraction, trivial mitral regurgitation, and mild aortic stenosis.  Previous echocardiogram from April 2019 reported LVEF 60 to 65% range.  He was placed on low-dose Coreg and lisinopril, did have transient acute on chronic renal insufficiency with creatinine up to 2.1 but more recently back near baseline.  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  He states that he feels much better, less short of breath, no orthopnea or PND.  He does not report any exertional chest pain at times, but states that occasionally he feels an unusual sensation in his chest.  He has had no palpitations or syncope.  Reports a history of premature CAD in paternal uncles and also a brother.  He has a personal history of hypertension, hyperlipidemia, and type 2 diabetes mellitus.  He has a prior long-term history of tobacco and alcohol use,  but quit both several years ago.  Today we discussed his recent testing and diagnosis, also went over his medications with further adjustments anticipated.  Past Medical History:  Diagnosis Date  . Benign prostatic hypertrophy   . COPD (chronic obstructive pulmonary disease) ([place]) December 15, 2009   FEV1 2.30 (70%) ratio 63 no better with B2 and DLCO 18/6 (73%) corrects to 106%  . Degenerative joint disease   . Depression   . Diverticulosis   . Essential hypertension    ACE inhibitor cough  . Fatty liver   . Hyperlipidemia   . Internal hemorrhoids   . Low back pain   . Otitis externa 07/30/2012  . Splenomegaly   . Type 2 diabetes mellitus ([place])   . Ulcer of foot ([place])    Right foot    Past Surgical History:  Procedure Laterality Date  . NO PAST SURGERIES  1980    Current Outpatient Medications  Medication Sig Dispense Refill  . acyclovir (ZOVIRAX) 400 MG tablet TAKE 1 TABLET TWICE DAILY (Patient taking differently: Take 400 mg by mouth 2 (two) times daily. ) 180 tablet 1  . albuterol (VENTOLIN HFA) 108 (90 Base) MCG/ACT inhaler Inhale 2 puffs into the lungs every 6 (six) hours as needed for wheezing or shortness of breath. 6.7 g 3  . ASCORBIC ACID PO Take 1 tablet by mouth daily.     Marland Kitchen aspirin EC 81 MG tablet Take 81 mg by mouth daily.    . Blood Glucose Monitoring Suppl (TRUE METRIX AIR GLUCOSE METER) w/Device KIT     .  carvedilol (COREG) 3.125 MG tablet Take 1 tablet (3.125 mg total) by mouth 2 (two) times daily with a meal. 60 tablet 0  . Cholecalciferol 1000 UNITS capsule Take 1 capsule (1,000 Units total) by mouth 2 (two) times daily.    . clotrimazole-betamethasone (LOTRISONE) cream Apply 1 application topically 2 (two) times daily. For 7 days maximum for severe jock itch . Stop if worsening symptoms. 45 g 1  . escitalopram (LEXAPRO) 5 MG tablet Take 1 tablet (5 mg total) by mouth daily. 90 tablet 5  . famotidine (PEPCID) 20 MG tablet TAKE 1 TABLET TWICE DAILY (Patient  taking differently: Take 20 mg by mouth daily. ) 180 tablet 1  . finasteride (PROSCAR) 5 MG tablet TAKE 1 TABLET EVERY DAY (Patient taking differently: Take 5 mg by mouth daily. ) 90 tablet 1  . furosemide (LASIX) 20 MG tablet Take 1 tablet (20 mg total) by mouth daily. 30 tablet 0  . gabapentin (NEURONTIN) 300 MG capsule Take 1 capsule (300 mg total) by mouth 2 (two) times daily. 180 capsule 3  . glucose blood (TRUE METRIX BLOOD GLUCOSE TEST) test strip TEST BLOOD SUGAR EVERY DAY 100 strip 1  . metFORMIN (GLUCOPHAGE) 500 MG tablet TAKE 1 TABLET EVERY DAY WITH BREAKFAST (Patient taking differently: Take 500 mg by mouth every evening. ) 90 tablet 3  . Multiple Vitamin (MULTIVITAMIN) capsule Take 1 capsule by mouth daily.      . NONFORMULARY OR COMPOUNDED ITEM [facility]  Peripheral Neuropathy Cream- Bupivacaine 1%, Doxepin 3%, Gabapentin 6%, Pentoxifylline 3%, Topiramate 1% Apply 1-2 grams to affected area 3-4 times daily Qty. 120 gm 3 refills    . omeprazole (PRILOSEC) 20 MG capsule TAKE 1 CAPSULE DAILY 30 TO 60 MINUTES BEFORE FIRST MEAL OF THE DAY (Patient taking differently: Take 20 mg by mouth daily. ) 90 capsule 1  . rosuvastatin (CRESTOR) 10 MG tablet TAKE 1 TABLET EVERY DAY (Patient taking differently: Take 10 mg by mouth daily. ) 90 tablet 3  . sodium chloride (MURO 128) 5 % ophthalmic solution Place 1 drop into the left eye at bedtime.    . tamsulosin (FLOMAX) 0.4 MG CAPS capsule TAKE 1 CAPSULE EVERY DAY (Patient taking differently: Take 0.4 mg by mouth daily. ) 90 capsule 1  . TRUEPLUS LANCETS 28G MISC Use to test blood sugars two times daily. Dx: E11.9 200 each 3  . [START ON 12/30/2019] sacubitril-valsartan (ENTRESTO) 24-26 MG Take 1 tablet by mouth 2 (two) times daily. 60 tablet 6   No current facility-administered medications for this visit.   Allergies:  Patient has no known allergies.   Social History: The patient  reports that he quit smoking about 35 years ago. His  smoking use included cigarettes. He has a 80.00 pack-year smoking history. He has never used smokeless tobacco. He reports that he does not drink alcohol or use drugs.   Family History: The patient's family history includes CAD in his brother; Cancer in his mother; Stroke in his father.   ROS:   Hearing loss, arthritic symptoms.  Physical Exam: VS:  BP 130/72   Pulse 93   Ht 6' (1.829 m)   Wt 198 lb (89.8 kg)   SpO2 98%   BMI 26.85 kg/m , BMI Body mass index is 26.85 kg/m.  Wt Readings from Last 3 Encounters:  12/27/19 198 lb (89.8 kg)  12/13/19 193 lb 12.8 oz (87.9 kg)  12/03/19 194 lb (88 kg)    General: Elderly male, appears comfortable  at rest. HEENT: Conjunctiva and lids normal, wearing a mask. Neck: Supple, no elevated JVP or carotid bruits, no thyromegaly. Lungs: No crackles or wheezing, nonlabored breathing. Cardiac: Indistinct PMI, RRR, no S3, 2/6 systolic murmur, no pericardial rub. Abdomen: Protuberant, nontender, bowel sounds present. Extremities: Mild venous stasis and trace ankle edema, distal pulses 2+. Skin: Warm and dry. Musculoskeletal: No kyphosis. Neuropsychiatric: Alert and oriented x3, affect grossly appropriate.  ECG:  An ECG dated 12/03/2019 was personally reviewed today and demonstrated:  Sinus tachycardia with R' in lead V1, poor R wave progression, nonspecific T wave changes.  Recent Labwork: 12/03/2019: B Natriuretic Peptide 927.6 12/04/2019: Magnesium 1.3; TSH 4.225 12/13/2019: ALT 25; AST 25; BUN 17; Creatinine, Ser 1.28; Hemoglobin 11.2; Platelets 134.0; Potassium 4.4; Sodium 142     Component Value Date/Time   CHOL 120 11/07/2019 1146   TRIG 126.0 11/07/2019 1146   HDL 45.60 11/07/2019 1146   CHOLHDL 3 11/07/2019 1146   VLDL 25.2 11/07/2019 1146   LDLCALC 49 11/07/2019 1146   LDLDIRECT 57.0 10/22/2018 1451    Other Studies Reviewed Today:  Echocardiogram 12/04/2019: 1. No LV thrombus seen on contrast imaging. Left ventricular ejection   fraction, by estimation, is 20 to 25%. The left ventricle has severely  decreased function. The left ventricle demonstrates global hypokinesis.  The left ventricular internal cavity  size was mildly dilated. Indeterminate diastolic filling due to E-A  fusion.  2. Right ventricular systolic function is normal. The right ventricular  size is normal. Tricuspid regurgitation signal is inadequate for assessing  PA pressure.  3. The mitral valve is grossly normal. Trivial mitral valve  regurgitation. No evidence of mitral stenosis.  4. The aortic valve is grossly normal. Aortic valve regurgitation is not  visualized. Mild aortic valve stenosis.   Assessment and Plan:  1.  Newly documented cardiomyopathy with LVEF 20 to 25%, global hypokinesis noted, but would still be suspicious about underlying ischemic etiology in light of risk factor profile.  High-sensitivity troponin I levels during hospital stay were not suggestive of ACS.  He has had symptomatic improvement following diuresis.  Plan at this time is to continue Coreg and Lasix at current doses, switch lisinopril to Entresto 24/26 mg twice daily.  Follow-up BMET in 10 days and then office visit for reassessment.  Anticipate addition of Aldactone next assuming renal function and potassium allow.  He will continue aspirin and Crestor for treatment of suspected ischemic heart disease.  Once medications are stabilized can determine mode of ischemic work-up.  2.  CKD stage IIIa, recent creatinine 1.28 and potassium normal.  3.  Essential hypertension, systolic is 964 today.  Medication adjustments are being made as noted above in association with his cardiomyopathy.  4.  Mixed hyperlipidemia on Crestor.  LDL 49 in January.  Medication Adjustments/Labs and Tests Ordered: Current medicines are reviewed at length with the patient today.  Concerns regarding medicines are outlined above.   Tests Ordered: Orders Placed This Encounter  Procedures   . Basic metabolic panel    Medication Changes: Meds ordered this encounter  Medications  . sacubitril-valsartan (ENTRESTO) 24-26 MG    Sig: Take 1 tablet by mouth 2 (two) times daily.    Dispense:  60 tablet    Refill:  6    12/27/2019 stop lisinopril-start 12/30/2019 Please use Voucher for this prescription. RXBIN: [id number] RXGRP: [id number] GOVPC: [id number] ID: [id number]    Disposition:  Follow up 3 to 4 weeks with [place] in the [place] office.  Signed, Satira Sark, MD, [facility] 12/27/2019 2:06 PM    [facility], [place], [place] [address] Phone: [phone]; Fax: [phone]

## 2019-12-30 ENCOUNTER — Other Ambulatory Visit: Payer: Self-pay | Admitting: *Deleted

## 2019-12-30 MED ORDER — ENTRESTO 24-26 MG PO TABS: 1.0000 | ORAL_TABLET | Freq: Two times a day (BID) | ORAL | 3 refills | Status: DC

## 2019-12-31 ENCOUNTER — Encounter: Payer: Self-pay | Admitting: Gastroenterology

## 2020-01-01 DIAGNOSIS — N179 Acute kidney failure, unspecified: Secondary | ICD-10-CM | POA: Diagnosis not present

## 2020-01-01 DIAGNOSIS — I5023 Acute on chronic systolic (congestive) heart failure: Secondary | ICD-10-CM | POA: Diagnosis not present

## 2020-01-01 DIAGNOSIS — N1831 Chronic kidney disease, stage 3a: Secondary | ICD-10-CM | POA: Diagnosis not present

## 2020-01-01 DIAGNOSIS — I501 Left ventricular failure: Secondary | ICD-10-CM | POA: Diagnosis not present

## 2020-01-01 DIAGNOSIS — I0981 Rheumatic heart failure: Secondary | ICD-10-CM | POA: Diagnosis not present

## 2020-01-01 DIAGNOSIS — I130 Hypertensive heart and chronic kidney disease with heart failure and stage 1 through stage 4 chronic kidney disease, or unspecified chronic kidney disease: Secondary | ICD-10-CM | POA: Diagnosis not present

## 2020-01-01 DIAGNOSIS — J9621 Acute and chronic respiratory failure with hypoxia: Secondary | ICD-10-CM | POA: Diagnosis not present

## 2020-01-01 DIAGNOSIS — J449 Chronic obstructive pulmonary disease, unspecified: Secondary | ICD-10-CM | POA: Diagnosis not present

## 2020-01-01 DIAGNOSIS — E1122 Type 2 diabetes mellitus with diabetic chronic kidney disease: Secondary | ICD-10-CM | POA: Diagnosis not present

## 2020-01-03 DIAGNOSIS — E1122 Type 2 diabetes mellitus with diabetic chronic kidney disease: Secondary | ICD-10-CM | POA: Diagnosis not present

## 2020-01-03 DIAGNOSIS — I0981 Rheumatic heart failure: Secondary | ICD-10-CM | POA: Diagnosis not present

## 2020-01-03 DIAGNOSIS — I130 Hypertensive heart and chronic kidney disease with heart failure and stage 1 through stage 4 chronic kidney disease, or unspecified chronic kidney disease: Secondary | ICD-10-CM | POA: Diagnosis not present

## 2020-01-03 DIAGNOSIS — N1831 Chronic kidney disease, stage 3a: Secondary | ICD-10-CM | POA: Diagnosis not present

## 2020-01-03 DIAGNOSIS — N179 Acute kidney failure, unspecified: Secondary | ICD-10-CM | POA: Diagnosis not present

## 2020-01-03 DIAGNOSIS — J9621 Acute and chronic respiratory failure with hypoxia: Secondary | ICD-10-CM | POA: Diagnosis not present

## 2020-01-03 DIAGNOSIS — I501 Left ventricular failure: Secondary | ICD-10-CM | POA: Diagnosis not present

## 2020-01-03 DIAGNOSIS — I5023 Acute on chronic systolic (congestive) heart failure: Secondary | ICD-10-CM | POA: Diagnosis not present

## 2020-01-03 DIAGNOSIS — J449 Chronic obstructive pulmonary disease, unspecified: Secondary | ICD-10-CM | POA: Diagnosis not present

## 2020-01-07 ENCOUNTER — Telehealth: Payer: Self-pay

## 2020-01-07 ENCOUNTER — Other Ambulatory Visit: Payer: Self-pay

## 2020-01-07 DIAGNOSIS — J449 Chronic obstructive pulmonary disease, unspecified: Secondary | ICD-10-CM | POA: Diagnosis not present

## 2020-01-07 DIAGNOSIS — I130 Hypertensive heart and chronic kidney disease with heart failure and stage 1 through stage 4 chronic kidney disease, or unspecified chronic kidney disease: Secondary | ICD-10-CM | POA: Diagnosis not present

## 2020-01-07 DIAGNOSIS — I0981 Rheumatic heart failure: Secondary | ICD-10-CM | POA: Diagnosis not present

## 2020-01-07 DIAGNOSIS — N1831 Chronic kidney disease, stage 3a: Secondary | ICD-10-CM | POA: Diagnosis not present

## 2020-01-07 DIAGNOSIS — I501 Left ventricular failure: Secondary | ICD-10-CM | POA: Diagnosis not present

## 2020-01-07 DIAGNOSIS — J9621 Acute and chronic respiratory failure with hypoxia: Secondary | ICD-10-CM | POA: Diagnosis not present

## 2020-01-07 DIAGNOSIS — I5023 Acute on chronic systolic (congestive) heart failure: Secondary | ICD-10-CM | POA: Diagnosis not present

## 2020-01-07 DIAGNOSIS — E1122 Type 2 diabetes mellitus with diabetic chronic kidney disease: Secondary | ICD-10-CM | POA: Diagnosis not present

## 2020-01-07 DIAGNOSIS — N179 Acute kidney failure, unspecified: Secondary | ICD-10-CM | POA: Diagnosis not present

## 2020-01-07 MED ORDER — ESCITALOPRAM OXALATE 5 MG PO TABS: 5.0000 mg | ORAL_TABLET | Freq: Every day | ORAL | 5 refills | Status: DC

## 2020-01-07 MED ORDER — CARVEDILOL 3.125 MG PO TABS: 3.1250 mg | ORAL_TABLET | Freq: Two times a day (BID) | ORAL | 0 refills | Status: DC

## 2020-01-07 MED ORDER — FUROSEMIDE 20 MG PO TABS: 20.0000 mg | ORAL_TABLET | Freq: Every day | ORAL | 0 refills | Status: DC

## 2020-01-07 MED ORDER — ROSUVASTATIN CALCIUM 10 MG PO TABS: 10.0000 mg | ORAL_TABLET | Freq: Every day | ORAL | 3 refills | Status: DC

## 2020-01-07 NOTE — Telephone Encounter (Signed)
error 

## 2020-01-08 DIAGNOSIS — H18599 Other hereditary corneal dystrophies, unspecified eye: Secondary | ICD-10-CM | POA: Diagnosis not present

## 2020-01-08 DIAGNOSIS — Z947 Corneal transplant status: Secondary | ICD-10-CM | POA: Diagnosis not present

## 2020-01-08 DIAGNOSIS — N1831 Chronic kidney disease, stage 3a: Secondary | ICD-10-CM | POA: Diagnosis not present

## 2020-01-08 DIAGNOSIS — J9621 Acute and chronic respiratory failure with hypoxia: Secondary | ICD-10-CM | POA: Diagnosis not present

## 2020-01-08 DIAGNOSIS — I501 Left ventricular failure: Secondary | ICD-10-CM | POA: Diagnosis not present

## 2020-01-08 DIAGNOSIS — I0981 Rheumatic heart failure: Secondary | ICD-10-CM | POA: Diagnosis not present

## 2020-01-08 DIAGNOSIS — Z961 Presence of intraocular lens: Secondary | ICD-10-CM | POA: Diagnosis not present

## 2020-01-08 DIAGNOSIS — I130 Hypertensive heart and chronic kidney disease with heart failure and stage 1 through stage 4 chronic kidney disease, or unspecified chronic kidney disease: Secondary | ICD-10-CM | POA: Diagnosis not present

## 2020-01-08 DIAGNOSIS — J449 Chronic obstructive pulmonary disease, unspecified: Secondary | ICD-10-CM | POA: Diagnosis not present

## 2020-01-08 DIAGNOSIS — E1122 Type 2 diabetes mellitus with diabetic chronic kidney disease: Secondary | ICD-10-CM | POA: Diagnosis not present

## 2020-01-08 DIAGNOSIS — N179 Acute kidney failure, unspecified: Secondary | ICD-10-CM | POA: Diagnosis not present

## 2020-01-08 DIAGNOSIS — I5023 Acute on chronic systolic (congestive) heart failure: Secondary | ICD-10-CM | POA: Diagnosis not present

## 2020-01-09 DIAGNOSIS — I130 Hypertensive heart and chronic kidney disease with heart failure and stage 1 through stage 4 chronic kidney disease, or unspecified chronic kidney disease: Secondary | ICD-10-CM | POA: Diagnosis not present

## 2020-01-09 DIAGNOSIS — J449 Chronic obstructive pulmonary disease, unspecified: Secondary | ICD-10-CM | POA: Diagnosis not present

## 2020-01-09 DIAGNOSIS — E1122 Type 2 diabetes mellitus with diabetic chronic kidney disease: Secondary | ICD-10-CM | POA: Diagnosis not present

## 2020-01-09 DIAGNOSIS — J9621 Acute and chronic respiratory failure with hypoxia: Secondary | ICD-10-CM | POA: Diagnosis not present

## 2020-01-09 DIAGNOSIS — N179 Acute kidney failure, unspecified: Secondary | ICD-10-CM | POA: Diagnosis not present

## 2020-01-09 DIAGNOSIS — N1831 Chronic kidney disease, stage 3a: Secondary | ICD-10-CM | POA: Diagnosis not present

## 2020-01-09 DIAGNOSIS — I5023 Acute on chronic systolic (congestive) heart failure: Secondary | ICD-10-CM | POA: Diagnosis not present

## 2020-01-09 DIAGNOSIS — I0981 Rheumatic heart failure: Secondary | ICD-10-CM | POA: Diagnosis not present

## 2020-01-09 DIAGNOSIS — I501 Left ventricular failure: Secondary | ICD-10-CM | POA: Diagnosis not present

## 2020-01-10 DIAGNOSIS — N179 Acute kidney failure, unspecified: Secondary | ICD-10-CM | POA: Diagnosis not present

## 2020-01-10 DIAGNOSIS — N1831 Chronic kidney disease, stage 3a: Secondary | ICD-10-CM | POA: Diagnosis not present

## 2020-01-10 DIAGNOSIS — I501 Left ventricular failure: Secondary | ICD-10-CM | POA: Diagnosis not present

## 2020-01-10 DIAGNOSIS — J9621 Acute and chronic respiratory failure with hypoxia: Secondary | ICD-10-CM | POA: Diagnosis not present

## 2020-01-10 DIAGNOSIS — I0981 Rheumatic heart failure: Secondary | ICD-10-CM | POA: Diagnosis not present

## 2020-01-10 DIAGNOSIS — I5023 Acute on chronic systolic (congestive) heart failure: Secondary | ICD-10-CM | POA: Diagnosis not present

## 2020-01-10 DIAGNOSIS — E1122 Type 2 diabetes mellitus with diabetic chronic kidney disease: Secondary | ICD-10-CM | POA: Diagnosis not present

## 2020-01-10 DIAGNOSIS — I130 Hypertensive heart and chronic kidney disease with heart failure and stage 1 through stage 4 chronic kidney disease, or unspecified chronic kidney disease: Secondary | ICD-10-CM | POA: Diagnosis not present

## 2020-01-10 DIAGNOSIS — J449 Chronic obstructive pulmonary disease, unspecified: Secondary | ICD-10-CM | POA: Diagnosis not present

## 2020-01-13 ENCOUNTER — Other Ambulatory Visit: Payer: Self-pay | Admitting: Family Medicine

## 2020-01-14 ENCOUNTER — Telehealth: Payer: Self-pay | Admitting: Cardiology

## 2020-01-14 DIAGNOSIS — J449 Chronic obstructive pulmonary disease, unspecified: Secondary | ICD-10-CM | POA: Diagnosis not present

## 2020-01-14 DIAGNOSIS — I5023 Acute on chronic systolic (congestive) heart failure: Secondary | ICD-10-CM | POA: Diagnosis not present

## 2020-01-14 DIAGNOSIS — I501 Left ventricular failure: Secondary | ICD-10-CM | POA: Diagnosis not present

## 2020-01-14 DIAGNOSIS — N1831 Chronic kidney disease, stage 3a: Secondary | ICD-10-CM | POA: Diagnosis not present

## 2020-01-14 DIAGNOSIS — J9621 Acute and chronic respiratory failure with hypoxia: Secondary | ICD-10-CM | POA: Diagnosis not present

## 2020-01-14 DIAGNOSIS — I0981 Rheumatic heart failure: Secondary | ICD-10-CM | POA: Diagnosis not present

## 2020-01-14 DIAGNOSIS — E1122 Type 2 diabetes mellitus with diabetic chronic kidney disease: Secondary | ICD-10-CM | POA: Diagnosis not present

## 2020-01-14 DIAGNOSIS — I130 Hypertensive heart and chronic kidney disease with heart failure and stage 1 through stage 4 chronic kidney disease, or unspecified chronic kidney disease: Secondary | ICD-10-CM | POA: Diagnosis not present

## 2020-01-14 DIAGNOSIS — N179 Acute kidney failure, unspecified: Secondary | ICD-10-CM | POA: Diagnosis not present

## 2020-01-14 NOTE — Telephone Encounter (Signed)
Spoke with patient who reports that he feels okay. Reports staying well hydrated. Denies dizziness SOB or chest pain. Advised that home health nurse is coming out tomorrow to draw specimen for bmet and will get vital signs again to update up our office tomorrow with HR. Verbalized understanding.

## 2020-01-14 NOTE — Telephone Encounter (Signed)
Heart Rate was 105 today when she went out to visit him

## 2020-01-14 NOTE — Telephone Encounter (Addendum)
Spoke with Elmyra Ricks with [facility] who says patient's resting HR was 105 today. Elmyra Ricks says his HR is usually in the normal range. Verbal order given to Elmyra Ricks to draw bmet that is past due. Per Elmyra Ricks, she will visit patient tomorrow and recheck vitals signs. Medications reviewed.

## 2020-01-15 ENCOUNTER — Telehealth: Payer: Self-pay

## 2020-01-15 DIAGNOSIS — I501 Left ventricular failure: Secondary | ICD-10-CM | POA: Diagnosis not present

## 2020-01-15 DIAGNOSIS — J449 Chronic obstructive pulmonary disease, unspecified: Secondary | ICD-10-CM | POA: Diagnosis not present

## 2020-01-15 DIAGNOSIS — I5023 Acute on chronic systolic (congestive) heart failure: Secondary | ICD-10-CM | POA: Diagnosis not present

## 2020-01-15 DIAGNOSIS — I130 Hypertensive heart and chronic kidney disease with heart failure and stage 1 through stage 4 chronic kidney disease, or unspecified chronic kidney disease: Secondary | ICD-10-CM | POA: Diagnosis not present

## 2020-01-15 DIAGNOSIS — I0981 Rheumatic heart failure: Secondary | ICD-10-CM | POA: Diagnosis not present

## 2020-01-15 DIAGNOSIS — J9621 Acute and chronic respiratory failure with hypoxia: Secondary | ICD-10-CM | POA: Diagnosis not present

## 2020-01-15 DIAGNOSIS — E1122 Type 2 diabetes mellitus with diabetic chronic kidney disease: Secondary | ICD-10-CM | POA: Diagnosis not present

## 2020-01-15 DIAGNOSIS — N179 Acute kidney failure, unspecified: Secondary | ICD-10-CM | POA: Diagnosis not present

## 2020-01-15 DIAGNOSIS — N1831 Chronic kidney disease, stage 3a: Secondary | ICD-10-CM | POA: Diagnosis not present

## 2020-01-15 LAB — BASIC METABOLIC PANEL
BUN: 17 (ref 4–21)
CO2: 25 — AB (ref 13–22)
Chloride: 106 (ref 99–108)
Creatinine: 1.4 — AB (ref 0.6–1.3)
Glucose: 146
Potassium: 4.9 (ref 3.4–5.3)
Sodium: 143 (ref 137–147)

## 2020-01-15 LAB — COMPREHENSIVE METABOLIC PANEL
Calcium: 9 (ref 8.7–10.7)
GFR calc Af Amer: 58
GFR calc non Af Amer: 50

## 2020-01-15 NOTE — Telephone Encounter (Signed)
Spoke with home health nurse patient has three serrate issues.  1. Patient has had increased heart rate. (message sent to cardiology yesterday) when she went out today to review meds patient has not had Cavedilo, Crestor or lasix in over two weeks. And Lexapro was never received. She has put first three meds in pill bottle and spoke to pharmacy and they are resending script for lexapro. Should be received soon. He is still having issues with elevated HR but feels this will resolve with taking medications. Today during PT HR was in 130's but resting was 98.   2. She is concerned about patients cognitive abilities and memory. He is able to have clear conversations but is not able to manage medications. Wife is in the home but never comes out of room with visits. I informed her that I am not familiar with patients family dynamics but will be happy to get your input and return call. She is questioning if she should reach out to children.   3. She also would like orders to extend visits for 1x week for 1 month. She does not feel it is safe for patient to discharge at this time.

## 2020-01-15 NOTE — Telephone Encounter (Signed)
I honestly do not recall interacting with patient's wife.  I would call patient and tell him we have some concerns about his memory and his ability to manage medications and see if he is willing to have wife come out with next home health visit to help-if she is not interested in helping I do think reaching out to children would be reasonable.  Lets have him restart all medications immediately-any barriers to him restarting?  Could they help set up a pillbox when they go out?  He is at high risk for hospitalization if he does not take his medications  If his heart rate does not come back down with medication we may need more immediate follow-up then next week which is scheduled

## 2020-01-16 ENCOUNTER — Other Ambulatory Visit: Payer: Self-pay

## 2020-01-16 MED ORDER — CARVEDILOL 3.125 MG PO TABS: 3.1250 mg | ORAL_TABLET | Freq: Two times a day (BID) | ORAL | 1 refills | Status: DC

## 2020-01-16 MED ORDER — FUROSEMIDE 20 MG PO TABS: 20.0000 mg | ORAL_TABLET | Freq: Every day | ORAL | 1 refills | Status: DC

## 2020-01-16 NOTE — Telephone Encounter (Signed)
Patient returning missed call. 

## 2020-01-16 NOTE — Telephone Encounter (Signed)
Patient is returning Joellen's call, states he will be free for the next hour.

## 2020-01-16 NOTE — Telephone Encounter (Signed)
Patient states he keeps missing the returned phone call, asked for a returned call later today or tomorrow,

## 2020-01-16 NOTE — Telephone Encounter (Signed)
Left message to return call to our office.  

## 2020-01-21 ENCOUNTER — Telehealth: Payer: Self-pay | Admitting: Family Medicine

## 2020-01-21 DIAGNOSIS — N179 Acute kidney failure, unspecified: Secondary | ICD-10-CM | POA: Diagnosis not present

## 2020-01-21 DIAGNOSIS — I130 Hypertensive heart and chronic kidney disease with heart failure and stage 1 through stage 4 chronic kidney disease, or unspecified chronic kidney disease: Secondary | ICD-10-CM | POA: Diagnosis not present

## 2020-01-21 DIAGNOSIS — J9621 Acute and chronic respiratory failure with hypoxia: Secondary | ICD-10-CM | POA: Diagnosis not present

## 2020-01-21 DIAGNOSIS — I0981 Rheumatic heart failure: Secondary | ICD-10-CM | POA: Diagnosis not present

## 2020-01-21 DIAGNOSIS — E1122 Type 2 diabetes mellitus with diabetic chronic kidney disease: Secondary | ICD-10-CM | POA: Diagnosis not present

## 2020-01-21 DIAGNOSIS — I5023 Acute on chronic systolic (congestive) heart failure: Secondary | ICD-10-CM | POA: Diagnosis not present

## 2020-01-21 DIAGNOSIS — J449 Chronic obstructive pulmonary disease, unspecified: Secondary | ICD-10-CM | POA: Diagnosis not present

## 2020-01-21 DIAGNOSIS — I501 Left ventricular failure: Secondary | ICD-10-CM | POA: Diagnosis not present

## 2020-01-21 DIAGNOSIS — N1831 Chronic kidney disease, stage 3a: Secondary | ICD-10-CM | POA: Diagnosis not present

## 2020-01-21 NOTE — Telephone Encounter (Signed)
Omeprazole can increase lexapro dosage- that's why I picked low dose  Aspirin can increase bleeding risk with lexapro- omepraozle likely reduces that risk. Im fine with current meds

## 2020-01-21 NOTE — Telephone Encounter (Signed)
Home Health is calling in statig that when they entered patients medications, it gives them a level 3 warning for escitalopram (LEXAPRO) 5 MG tablet there is an interaction between aspirin EC 81 MG tablet and omeprazole (PRILOSEC) 20 MG capsule.

## 2020-01-21 NOTE — Telephone Encounter (Signed)
See below

## 2020-01-22 ENCOUNTER — Other Ambulatory Visit: Payer: Self-pay

## 2020-01-22 ENCOUNTER — Ambulatory Visit (INDEPENDENT_AMBULATORY_CARE_PROVIDER_SITE_OTHER): Payer: Medicare HMO | Admitting: Family Medicine

## 2020-01-22 ENCOUNTER — Ambulatory Visit (INDEPENDENT_AMBULATORY_CARE_PROVIDER_SITE_OTHER): Payer: Medicare HMO

## 2020-01-22 ENCOUNTER — Encounter: Payer: Self-pay | Admitting: Family Medicine

## 2020-01-22 VITALS — BP 118/68 | HR 94 | Temp 99.0°F | Ht 72.0 in | Wt 198.0 lb

## 2020-01-22 DIAGNOSIS — E1122 Type 2 diabetes mellitus with diabetic chronic kidney disease: Secondary | ICD-10-CM | POA: Diagnosis not present

## 2020-01-22 DIAGNOSIS — D696 Thrombocytopenia, unspecified: Secondary | ICD-10-CM

## 2020-01-22 DIAGNOSIS — J9621 Acute and chronic respiratory failure with hypoxia: Secondary | ICD-10-CM | POA: Diagnosis not present

## 2020-01-22 DIAGNOSIS — N1831 Chronic kidney disease, stage 3a: Secondary | ICD-10-CM | POA: Diagnosis not present

## 2020-01-22 DIAGNOSIS — I5022 Chronic systolic (congestive) heart failure: Secondary | ICD-10-CM

## 2020-01-22 DIAGNOSIS — D7589 Other specified diseases of blood and blood-forming organs: Secondary | ICD-10-CM | POA: Diagnosis not present

## 2020-01-22 DIAGNOSIS — R0602 Shortness of breath: Secondary | ICD-10-CM | POA: Diagnosis not present

## 2020-01-22 DIAGNOSIS — N2889 Other specified disorders of kidney and ureter: Secondary | ICD-10-CM

## 2020-01-22 DIAGNOSIS — R112 Nausea with vomiting, unspecified: Secondary | ICD-10-CM

## 2020-01-22 DIAGNOSIS — N179 Acute kidney failure, unspecified: Secondary | ICD-10-CM | POA: Diagnosis not present

## 2020-01-22 DIAGNOSIS — I0981 Rheumatic heart failure: Secondary | ICD-10-CM | POA: Diagnosis not present

## 2020-01-22 DIAGNOSIS — I5023 Acute on chronic systolic (congestive) heart failure: Secondary | ICD-10-CM | POA: Diagnosis not present

## 2020-01-22 DIAGNOSIS — I501 Left ventricular failure: Secondary | ICD-10-CM | POA: Diagnosis not present

## 2020-01-22 DIAGNOSIS — I10 Essential (primary) hypertension: Secondary | ICD-10-CM | POA: Diagnosis not present

## 2020-01-22 DIAGNOSIS — I130 Hypertensive heart and chronic kidney disease with heart failure and stage 1 through stage 4 chronic kidney disease, or unspecified chronic kidney disease: Secondary | ICD-10-CM | POA: Diagnosis not present

## 2020-01-22 DIAGNOSIS — J449 Chronic obstructive pulmonary disease, unspecified: Secondary | ICD-10-CM

## 2020-01-22 DIAGNOSIS — N183 Chronic kidney disease, stage 3 unspecified: Secondary | ICD-10-CM | POA: Diagnosis not present

## 2020-01-22 LAB — COMPREHENSIVE METABOLIC PANEL
ALT: 10 U/L (ref 0–53)
AST: 19 U/L (ref 0–37)
Albumin: 3.7 g/dL (ref 3.5–5.2)
Alkaline Phosphatase: 80 U/L (ref 39–117)
BUN: 29 mg/dL — ABNORMAL HIGH (ref 6–23)
CO2: 27 mEq/L (ref 19–32)
Calcium: 8.6 mg/dL (ref 8.4–10.5)
Chloride: 102 mEq/L (ref 96–112)
Creatinine, Ser: 1.52 mg/dL — ABNORMAL HIGH (ref 0.40–1.50)
GFR: 44.46 mL/min — ABNORMAL LOW (ref >60.00)
Glucose, Bld: 146 mg/dL — ABNORMAL HIGH (ref 70–99)
Potassium: 4.2 mEq/L (ref 3.5–5.1)
Sodium: 141 mEq/L (ref 135–145)
Total Bilirubin: 0.7 mg/dL (ref 0.2–1.2)
Total Protein: 6 g/dL (ref 6.0–8.3)

## 2020-01-22 LAB — CBC WITH DIFFERENTIAL/PLATELET
Basophils Absolute: 0.1 10*3/uL (ref 0.0–0.1)
Basophils Relative: 0.5 % (ref 0.0–3.0)
Eosinophils Absolute: 0 10*3/uL (ref 0.0–0.7)
Eosinophils Relative: 0 % (ref 0.0–5.0)
HCT: 32.7 % — ABNORMAL LOW (ref 39.0–52.0)
Hemoglobin: 10.8 g/dL — ABNORMAL LOW (ref 13.0–17.0)
Lymphocytes Relative: 5.5 % — ABNORMAL LOW (ref 12.0–46.0)
Lymphs Abs: 0.7 10*3/uL (ref 0.7–4.0)
MCHC: 33.1 g/dL (ref 30.0–36.0)
MCV: 100.7 fl — ABNORMAL HIGH (ref 78.0–100.0)
Monocytes Absolute: 0.3 10*3/uL (ref 0.1–1.0)
Monocytes Relative: 2.4 % — ABNORMAL LOW (ref 3.0–12.0)
Neutro Abs: 10.8 10*3/uL — ABNORMAL HIGH (ref 1.4–7.7)
Neutrophils Relative %: 91.6 % — ABNORMAL HIGH (ref 43.0–77.0)
Platelets: 101 10*3/uL — ABNORMAL LOW (ref 150.0–400.0)
RBC: 3.25 Mil/uL — ABNORMAL LOW (ref 4.22–5.81)
RDW: 16.6 % — ABNORMAL HIGH (ref 11.5–15.5)
WBC: 11.8 10*3/uL — ABNORMAL HIGH (ref 4.0–10.5)

## 2020-01-22 NOTE — Patient Instructions (Addendum)
We will draw labs in room  Then we will get chest x-ray  We will call about results- may have some medication adjustments short term. Depending on kidney function- may need repeat or even to send you to hospital depending on levels (hoping not that bad)  If you have chest pain or worsening shortness of breath seek care in hospital but seemed pretty stable today. Let us know if fevers increase. Give me an update on Friday regardless with how you are feeling   Recommended follow up: Return in about 1 month (around 02/21/2020).

## 2020-01-22 NOTE — Progress Notes (Signed)
Phone [phone] In person visit   Subjective:   [name] is a 79 y.o. year old very pleasant male patient who presents for/with See problem oriented charting Chief Complaint  Patient presents with  . Nausea    N&V with low o2 and hr started last night.    This visit occurred during the SARS-CoV-2 public health emergency.  Safety protocols were in place, including screening questions prior to the visit, additional usage of staff PPE, and extensive cleaning of exam room while observing appropriate contact time as indicated for disinfecting solutions.   Past Medical History-  Patient Active Problem List   Diagnosis Date Noted  . 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[place]) 12/14/2019    Priority: High  . Chronic systolic heart failure ([place]) 12/05/2019    Priority: High  . Thrombocytopenia ([place]) 12/21/2015    Priority: High  . 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 ILD (interstitial lung disease) ([place]) 07/05/2012    Priority: High  . COPD (chronic obstructive pulmonary disease) ([place]) 12/15/2009    Priority: High  . Chronic back pain. Off narcotics 06/05/17 due to negative UDS for opiates x2. Full history 06/12/14. Pain contract signed.  05/02/2007    Priority: High  . DM (diabetes mellitus) type II controlled, neurological manifestation ([place]) 04/06/2007    Priority: High  . B12 deficiency 08/20/2015    Priority: Medium  . Diabetic polyneuropathy associated with type 2 diabetes mellitus ([place]) 08/07/2015    Priority: Medium  . CKD (chronic kidney disease), stage III 05/06/2015    Priority: Medium  . Fatty liver 11/04/2014    Priority: Medium  . BPH (benign prostatic hyperplasia) 06/20/2007    Priority: Medium  . Depression 05/02/2007    Priority: Medium  . Hyperlipidemia associated with type 2 diabetes mellitus ([place]) 04/06/2007    Priority: Medium  . Essential hypertension 04/06/2007    Priority: Medium  . Osteoarthritis 04/06/2007    Priority: Medium   . Aortic atherosclerosis ([place]) 12/14/2019    Priority: Low  . GERD (gastroesophageal reflux disease) 09/30/2017    Priority: Low  . 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 positive 03/29/2016    Priority: Low  . 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 Diabetic ulcer of right midfoot associated with diabetes mellitus due to underlying condition, with fat layer exposed ([place]) 11/07/2019  . Anemia 03/05/2018  . Diplopia 06/05/2015  . PCO (posterior capsular opacification) 06/19/2013  . 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 Pseudophakia of left eye 04/10/2013  . Nuclear cataract 01/20/2012  . 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 acyclovir (ZOVIRAX) 400 MG tablet TAKE 1 TABLET TWICE DAILY 180 tablet 1  . albuterol (VENTOLIN HFA) 108 (90 Base) MCG/ACT inhaler Inhale 2 puffs into the lungs every 6 (six) hours as needed for wheezing or shortness of breath. 6.7 g 3  . ASCORBIC ACID PO Take 1 tablet by mouth daily.     Marland Kitchen aspirin EC 81 MG tablet Take 81 mg by mouth daily.    . Blood Glucose Monitoring Suppl (TRUE METRIX AIR GLUCOSE METER) w/Device KIT     . carvedilol (COREG) 3.125 MG tablet Take 1 tablet (3.125 mg total) by mouth 2 (two) times daily with a meal. 180 tablet 1  . Cholecalciferol 1000 UNITS capsule Take 1 capsule (1,000 Units total) by mouth 2 (two) times daily.    Marland Kitchen  clotrimazole-betamethasone (LOTRISONE) cream Apply 1 application topically 2 (two) times daily. For 7 days maximum for severe jock itch . Stop if worsening symptoms. 45 g 1  . escitalopram (LEXAPRO) 5 MG tablet Take 1 tablet (5 mg total) by mouth daily. 90 tablet 5  . famotidine (PEPCID) 20 MG tablet TAKE 1 TABLET TWICE DAILY (Patient taking differently: Take 20 mg by mouth daily. ) 180 tablet 1  . finasteride (PROSCAR) 5 MG tablet TAKE 1 TABLET EVERY DAY (Patient taking differently: Take 5 mg by mouth  daily. ) 90 tablet 1  . furosemide (LASIX) 20 MG tablet Take 1 tablet (20 mg total) by mouth daily. 90 tablet 1  . gabapentin (NEURONTIN) 300 MG capsule Take 1 capsule (300 mg total) by mouth 2 (two) times daily. 180 capsule 3  . glucose blood (TRUE METRIX BLOOD GLUCOSE TEST) test strip TEST BLOOD SUGAR EVERY DAY 100 strip 1  . metFORMIN (GLUCOPHAGE) 500 MG tablet TAKE 1 TABLET EVERY DAY WITH BREAKFAST (Patient taking differently: Take 500 mg by mouth every evening. ) 90 tablet 3  . Multiple Vitamin (MULTIVITAMIN) capsule Take 1 capsule by mouth daily.      . NONFORMULARY OR COMPOUNDED ITEM [facility]  Peripheral Neuropathy Cream- Bupivacaine 1%, Doxepin 3%, Gabapentin 6%, Pentoxifylline 3%, Topiramate 1% Apply 1-2 grams to affected area 3-4 times daily Qty. 120 gm 3 refills    . omeprazole (PRILOSEC) 20 MG capsule TAKE 1 CAPSULE DAILY 30 TO 60 MINUTES BEFORE FIRST MEAL OF THE DAY (Patient taking differently: Take 20 mg by mouth daily. ) 90 capsule 1  . rosuvastatin (CRESTOR) 10 MG tablet Take 1 tablet (10 mg total) by mouth daily. 90 tablet 3  . sacubitril-valsartan (ENTRESTO) 24-26 MG Take 1 tablet by mouth 2 (two) times daily. 60 tablet 3  . sodium chloride (MURO 128) 5 % ophthalmic solution Place 1 drop into the left eye at bedtime.    . tamsulosin (FLOMAX) 0.4 MG CAPS capsule TAKE 1 CAPSULE EVERY DAY (Patient taking differently: Take 0.4 mg by mouth daily. ) 90 capsule 1  . TRUEPLUS LANCETS 28G MISC Use to test blood sugars two times daily. Dx: E11.9 200 each 3   No current facility-administered medications for this visit.     Objective:  BP 118/68   Pulse 94 Comment: irregular  Temp 99 F (37.2 C) (Oral) Comment (Src): checked both ways and temp was 99 both times.  Ht 6' (1.829 m)   Wt 198 lb (89.8 kg)   SpO2 96%   BMI 26.85 kg/m  Gen: NAD, resting comfortably CV: RRR no murmurs rubs or gallops Lungs: CTAB no crackles, wheeze, rhonchi Abdomen:  soft/nontender/nondistended/normal bowel sounds. No rebound or guarding.  Ext: no edema Skin: warm, dry, no obvious rash    Assessment and Plan   # nausea and  Vomiting/shortness of breath S:Patient states that last night he had some nausea vomiting with weakness.  He feels like he ate something bad yesterday-not sure exactly what.  Vomited 2-4 times nonbilious nonbloody.  No diarrhea.  He did have issue with oxygen getting low last night-his alarm sensor went off-he had to take oxygen and this helped him feel much better-no issues yet today though he has felt mildly short of breath. Denies any cough.  No fever or chills but temperature slightly higher at 99 today he has not had nausea or vomiting again today but patient does feel much weaker today than recently. Had to stop PT because he did  not feel well yesterday I believe.  Apparently his heart rate went down into the 30s once with physical therapy-not clear if he was getting a good reading at that time-heart rate in normal range today  A/P: 79 year old male who was seen last month for heart failure follow-up post hospitalization.  Patient now with an episode yesterday of nausea and vomiting-has had some lingering mild shortness of breath and ongoing fatigue since that time.  We will update blood work.  He was started on Entresto by cardiology in with nausea and vomiting and him being on Lasix as well 1 to make sure renal function has not substantially worsened.  Check CBC, CMP.  With shortness of breath will get chest x-ray-he does not appear fluid overloaded and I am not going to check a BMP at this time.  Weight is also stable from last visit which points away from fluid overload.  #Macrocytosis/leukocytosis/anemia/thrombocytopenia-patient was scheduled to see hematology after last visit-he states he forgot this visit.  Prior extensive blood work March 2020.  We will update CBC and I suspect we will need to repeat referral.  #Renal mass-from  last ultrasound of abdomen "Lobulated renal parenchyma versus less likely left renal mass.Further evaluation with ultrasound or CT with IV contrast on a nonemergent/outpatient basis recommended." -I went ahead and order this today-hopeful no significant worsening of creatinine and this is able to be completed  #ILD/COPD S: Patient carries a diagnosis of COPD due to former smoking.  Also previously diagnosed as having interstitial lung disease by pulmonology.  He does not follow-up with them anymore.   He has albuterol but uses it 1-3x a day typically-he did not report any increased usage over the last 24 hours.  Has used oxygen at nighttime since heart failure hospitalization and this was helpful last night.   A/P: No wheeze on exam today.  Did have lower oxygen saturation last night.  Has mild shortness of breath-we will get chest x-ray but do not strongly suspect COPD exacerbation.  #/Congestive heart failure with EF 20 to 25%/hypertension/CKD stage III S: Post heart failure hospitalization patient compliant with carvedilol, Lasix 20 mg, Entresto started by cardiology. Prior to heart failure:  amlodipine 70m, lisinopril 2.542m Today, patient without weight gain or increased edema.  Blood pressures well controlled  Chronic kidney disease has been stable with GFR in the 50s-several readings in 2019 and 2020 with GFR at 60 or above   A/P: CHF appears stable.  Blood pressure is well controlled.  Hoping CKD stage III stable particular with nausea and vomiting yesterday-update CMP today  # Depression S: Well controlled with Lexapro 5 mg started in 2021 -patient denies any sleep issues which was primary reason he was using amitriptyline Patient stopped taking Prozac in 2020 or 2021.  Previously been on amitriptyline 50 mg but we titrated off due to fall risk/age/potential memory concerns A/P: Thankfully stable-appears in full remission -continue current meds  Recommended follow up: Return in about  1 month (around 02/21/2020) for follow up- or sooner if needed. Future Appointments  Date Time Provider [place]4/20/2021  3:00 PM StErma HeritagePA[facility] [place] [facility]  02/18/2020  4:00 PM GaMarzetta BoardDPM [facility]  02/25/2020  1:20 PM HuMarin OlpMD [facility]    Lab/Order associations:   ICD-10-CM   1. Nausea and vomiting, intractability of vomiting not specified, unspecified vomiting type  R11.2 CBC with Differential/Platelet    Comprehensive metabolic panel  2.  Shortness of breath  R06.02 DG Chest 2 View  3. Renal mass  N28.89 CT Abdomen Pelvis W Contrast  4. Thrombocytopenia ([place])  D69.6   5. Macrocytosis  D75.89   6. Chronic obstructive pulmonary disease, unspecified COPD type ([place])  J44.9   7. Other specified disorders of kidney and ureter  N28.89 CT Abdomen Pelvis W Contrast  8. Chronic systolic heart failure (HCC)  I50.22   9. Stage 3 chronic kidney disease, unspecified whether stage 3a or 3b CKD  N18.30   10. Essential hypertension  I10    Return precautions advised.  If patient has new or worsening symptoms he should return to see [place] ASAP-hopefully nausea and vomiting remain resolved and fatigue slowly improves [name], MD

## 2020-01-22 NOTE — Telephone Encounter (Signed)
Lm on HH vm tcb.

## 2020-01-22 NOTE — Progress Notes (Signed)
Notes from Dr. Yong Channel No obvious pneumonia on chest x-ray.  No obvious fluid overload related to heart failure either-encouraging x-ray.Once again if you have new or worsening symptoms please make sure to let us know immediately or seek care

## 2020-01-23 ENCOUNTER — Encounter: Payer: Self-pay | Admitting: *Deleted

## 2020-01-24 DIAGNOSIS — J9611 Chronic respiratory failure with hypoxia: Secondary | ICD-10-CM | POA: Diagnosis not present

## 2020-01-24 DIAGNOSIS — J449 Chronic obstructive pulmonary disease, unspecified: Secondary | ICD-10-CM | POA: Diagnosis not present

## 2020-01-27 ENCOUNTER — Other Ambulatory Visit: Payer: Self-pay | Admitting: Family Medicine

## 2020-01-27 DIAGNOSIS — N2889 Other specified disorders of kidney and ureter: Secondary | ICD-10-CM

## 2020-01-28 ENCOUNTER — Encounter: Payer: Self-pay | Admitting: Student

## 2020-01-28 ENCOUNTER — Ambulatory Visit: Payer: Medicare HMO | Admitting: Student

## 2020-01-28 ENCOUNTER — Other Ambulatory Visit: Payer: Self-pay

## 2020-01-28 VITALS — BP 138/68 | HR 86 | Temp 98.1°F | Ht 72.0 in | Wt 200.0 lb

## 2020-01-28 DIAGNOSIS — I130 Hypertensive heart and chronic kidney disease with heart failure and stage 1 through stage 4 chronic kidney disease, or unspecified chronic kidney disease: Secondary | ICD-10-CM | POA: Diagnosis not present

## 2020-01-28 DIAGNOSIS — N1831 Chronic kidney disease, stage 3a: Secondary | ICD-10-CM

## 2020-01-28 DIAGNOSIS — Z79899 Other long term (current) drug therapy: Secondary | ICD-10-CM

## 2020-01-28 DIAGNOSIS — I5022 Chronic systolic (congestive) heart failure: Secondary | ICD-10-CM | POA: Diagnosis not present

## 2020-01-28 DIAGNOSIS — I10 Essential (primary) hypertension: Secondary | ICD-10-CM | POA: Diagnosis not present

## 2020-01-28 DIAGNOSIS — E1169 Type 2 diabetes mellitus with other specified complication: Secondary | ICD-10-CM

## 2020-01-28 DIAGNOSIS — E785 Hyperlipidemia, unspecified: Secondary | ICD-10-CM | POA: Diagnosis not present

## 2020-01-28 DIAGNOSIS — J449 Chronic obstructive pulmonary disease, unspecified: Secondary | ICD-10-CM | POA: Diagnosis not present

## 2020-01-28 MED ORDER — CARVEDILOL 6.25 MG PO TABS: 6.2500 mg | ORAL_TABLET | Freq: Two times a day (BID) | ORAL | 3 refills | Status: DC

## 2020-01-28 NOTE — Patient Instructions (Signed)
Medication Instructions:  INCREASE coreg to 6.25 mg twice a day  *If you need a refill on your cardiac medications before your next appointment, please call your pharmacy*   Lab Work: BMET next week  If you have labs (blood work) drawn today and your tests are completely normal, you will receive your results only by: Marland Kitchen MyChart Message (if you have MyChart) OR . A paper copy in the mail If you have any lab test that is abnormal or we need to change your treatment, we will call you to review the results.   Testing/Procedures: None today   Follow-Up: At [facility], you and your health needs are our priority.  As part of our continuing mission to provide you with exceptional heart care, we have created designated Provider Care Teams.  These Care Teams include your primary Cardiologist (physician) and Advanced Practice Providers (APPs -  Physician Assistants and Nurse Practitioners) who all work together to provide you with the care you need, when you need it.  We recommend signing up for the patient portal called "MyChart".  Sign up information is provided on this After Visit Summary.  MyChart is used to connect with patients for Virtual Visits (Telemedicine).  Patients are able to view lab/test results, encounter notes, upcoming appointments, etc.  Non-urgent messages can be sent to your provider as well.   To learn more about what you can do with MyChart, go to [email or web address].    Your next appointment:   4 week(s)  The format for your next appointment:   In Person  Provider:   Bernerd Pho, PA-C or Dr.McDowell       Thank you for choosing [facility] !          Other Instructions None

## 2020-01-28 NOTE — Progress Notes (Signed)
Cardiology Office Note    Date:  01/28/2020   ID:  Joshua, Frazier 1941/02/18, MRN [id number]  PCP:  [name], MD  Cardiologist: Rozann Lesches, MD    Chief Complaint  Patient presents with  . Follow-up    4 week visit    History of Present Illness:    [name] is a 79 y.o. male with past medical history of chronic systolic CHF (EF 29-92% by echo in 11/2019), HTN, HLD, Type 2 DM, Stage 3 CKD, liver cirrhosis, thrombocytopenia and COPD who presents to the office today for 4-week follow-up.   He was last examined by Dr. Domenic Polite in 12/2019 as a new patient referral after having been admitted in 11/2019 for acute hypoxic respiratory failure in the setting of a new cardiomyopathy as EF was down to 20-25%. At the time of his visit, he reported improvement in his breathing and denied any exertional chest pain. He was continued on Coreg 3.174m BID and Lasix 220mdaily with Lisinopril being transitioned to Entresto 24-2644mID. Was recommended to consider adding Spironolactone at follow-up if renal function and BP allowed.   Follow-up labs on 4/7 showed creatinine at 1.4 and this had further trended upwards to 1.52 when checked by his PCP on 4/14 in the setting of nausea and vomiting. CXR was also obtained on 4/14 and showed no acute cardiopulmonary abnormalities.   In talking with the patient today, he reports still having dyspnea on exertion but says this has significantly improved over the past few months. He was previously unable to walk up a small incline but now has been walking to his mailbox on a daily basis without significant dyspnea. No recent chest pain or palpitations.  He denies any orthopnea or PND. His lower extremity edema has overall been well controlled. He did have an isolated episode of nausea and vomiting last week but denies any recurrent symptoms.  Also, today is his birthday!   Past Medical History:  Diagnosis Date  . Benign prostatic hypertrophy    . COPD (chronic obstructive pulmonary disease) ([facility], [facility]   FEV1 2.30 (70%) ratio 63 no better with B2 and DLCO 18/6 (73%) corrects to 106%  . Degenerative joint disease   . Depression   . Diverticulosis   . Essential hypertension    ACE inhibitor cough  . Fatty liver   . Hyperlipidemia   . Internal hemorrhoids   . Low back pain   . Otitis externa 07/30/2012  . Splenomegaly   . Type 2 diabetes mellitus ([facility] . Ulcer of foot ([facility]  Right foot    Past Surgical History:  Procedure Laterality Date  . NO PAST SURGERIES  1980    Current Medications: Outpatient Medications Prior to Visit  Medication Sig Dispense Refill  . acyclovir (ZOVIRAX) 400 MG tablet TAKE 1 TABLET TWICE DAILY 180 tablet 1  . albuterol (VENTOLIN HFA) 108 (90 Base) MCG/ACT inhaler Inhale 2 puffs into the lungs every 6 (six) hours as needed for wheezing or shortness of breath. 6.7 g 3  . ASCORBIC ACID PO Take 1 tablet by mouth daily.     . aMarland Kitchenpirin EC 81 MG tablet Take 81 mg by mouth daily.    . Blood Glucose Monitoring Suppl (TRUE METRIX AIR GLUCOSE METER) w/Device KIT     . Cholecalciferol 1000 UNITS capsule Take 1 capsule (1,000 Units total) by mouth 2 (two) times daily.    . clotrimazole-betamethasone (LOTRISONE) cream  Apply 1 application topically 2 (two) times daily. For 7 days maximum for severe jock itch . Stop if worsening symptoms. 45 g 1  . escitalopram (LEXAPRO) 5 MG tablet Take 1 tablet (5 mg total) by mouth daily. 90 tablet 5  . famotidine (PEPCID) 20 MG tablet TAKE 1 TABLET TWICE DAILY (Patient taking differently: Take 20 mg by mouth daily. ) 180 tablet 1  . finasteride (PROSCAR) 5 MG tablet TAKE 1 TABLET EVERY DAY (Patient taking differently: Take 5 mg by mouth daily. ) 90 tablet 1  . furosemide (LASIX) 20 MG tablet Take 1 tablet (20 mg total) by mouth daily. 90 tablet 1  . gabapentin (NEURONTIN) 300 MG capsule Take 1 capsule (300 mg total) by mouth 2 (two) times daily. 180 capsule 3  .  glucose blood (TRUE METRIX BLOOD GLUCOSE TEST) test strip TEST BLOOD SUGAR EVERY DAY 100 strip 1  . metFORMIN (GLUCOPHAGE) 500 MG tablet TAKE 1 TABLET EVERY DAY WITH BREAKFAST (Patient taking differently: Take 500 mg by mouth every evening. ) 90 tablet 3  . Multiple Vitamin (MULTIVITAMIN) capsule Take 1 capsule by mouth daily.      . NONFORMULARY OR COMPOUNDED ITEM [facility]  Peripheral Neuropathy Cream- Bupivacaine 1%, Doxepin 3%, Gabapentin 6%, Pentoxifylline 3%, Topiramate 1% Apply 1-2 grams to affected area 3-4 times daily Qty. 120 gm 3 refills    . omeprazole (PRILOSEC) 20 MG capsule TAKE 1 CAPSULE DAILY 30 TO 60 MINUTES BEFORE FIRST MEAL OF THE DAY (Patient taking differently: Take 20 mg by mouth daily. ) 90 capsule 1  . rosuvastatin (CRESTOR) 10 MG tablet Take 1 tablet (10 mg total) by mouth daily. 90 tablet 3  . sacubitril-valsartan (ENTRESTO) 24-26 MG Take 1 tablet by mouth 2 (two) times daily. 60 tablet 3  . sodium chloride (MURO 128) 5 % ophthalmic solution Place 1 drop into the left eye at bedtime.    . tamsulosin (FLOMAX) 0.4 MG CAPS capsule TAKE 1 CAPSULE EVERY DAY (Patient taking differently: Take 0.4 mg by mouth daily. ) 90 capsule 1  . TRUEPLUS LANCETS 28G MISC Use to test blood sugars two times daily. Dx: E11.9 200 each 3  . carvedilol (COREG) 3.125 MG tablet Take 1 tablet (3.125 mg total) by mouth 2 (two) times daily with a meal. 180 tablet 1   No facility-administered medications prior to visit.     Allergies:   Patient has no known allergies.   Social History   Socioeconomic History  . Marital status: Married    Spouse name: Not on file  . Number of children: Not on file  . Years of education: Not on file  . Highest education level: Not on file  Occupational History  . Occupation: retired    Comment: maintenence work  Tobacco Use  . Smoking status: Former Smoker    Packs/day: 4.00    Years: 20.00    Pack years: 80.00    Types: Cigarettes    Quit  date: 09/18/1984    Years since quitting: 35.3  . Smokeless tobacco: Never Used  . Tobacco comment: quit in 1980  Substance and Sexual Activity  . 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 Drug use: No  . Sexual activity: Yes  Other Topics Concern  . Not on file  Social History Narrative   Married 53 years in 2015. 4 kids (2 sons) and 3 grandkids.    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 Difficulty of Paying Living Expenses:   Food Insecurity:   . Worried About Charity fundraiser in the Last Year:   . Arboriculturist in the Last Year:   Transportation Needs:   . Film/video editor (Medical):   Marland Kitchen Lack of Transportation (Non-Medical):   Physical Activity:   . Days of Exercise per Week:   . Minutes of Exercise per Session:   Stress:   . Feeling of Stress :   Social Connections:   . Frequency of Communication with Friends and Family:   . Frequency of Social Gatherings with Friends and Family:   . Attends Religious Services:   . Active Member of Clubs or Organizations:   . Attends Archivist Meetings:   Marland Kitchen Marital Status:      Family History:  The patient's family history includes CAD in his brother; Melanoma in his mother; Stroke in his father.   Review of Systems:   Please see the history of present illness.     General:  No chills, fever, night sweats or weight changes.  Cardiovascular:  No chest pain, edema, orthopnea, palpitations, paroxysmal nocturnal dyspnea. Positive for dyspnea on exertion.  Dermatological: No rash, lesions/masses Respiratory: No cough, dyspnea Urologic: No hematuria, dysuria Abdominal:   No nausea, vomiting, diarrhea, bright red blood per rectum, melena, or hematemesis Neurologic:  No visual changes, wkns, changes in mental status. All other  systems reviewed and are otherwise negative except as noted above.   Physical Exam:    VS:  BP 138/68   Pulse 86   Temp 98.1 F (36.7 C)   Ht 6' (1.829 m)   Wt 200 lb (90.7 kg)   SpO2 97%   BMI 27.12 kg/m    General: Well developed, well nourished,male appearing in no acute distress. Head: Normocephalic, atraumatic, sclera non-icteric.  Neck: No carotid bruits. JVD not elevated.  Lungs: Respirations regular and unlabored, without wheezes or rales.  Heart: Regular rate and rhythm. No S3 or S4.  No murmur, no rubs, or gallops appreciated. Abdomen: Soft, non-tender, non-distended. No obvious abdominal masses. Msk:  Strength and tone appear normal for age. No obvious joint deformities or effusions. Extremities: No clubbing or cyanosis. No lower extremity edema.  Distal pedal pulses are 2+ bilaterally. Neuro: Alert and oriented X 3. Moves all extremities spontaneously. No focal deficits noted. Psych:  Responds to questions appropriately with a normal affect. Skin: No rashes or lesions noted  Wt Readings from Last 3 Encounters:  01/28/20 200 lb (90.7 kg)  01/22/20 198 lb (89.8 kg)  12/27/19 198 lb (89.8 kg)     Studies/Labs Reviewed:   EKG:  EKG is not ordered today.  Recent Labs: 12/03/2019: B Natriuretic Peptide 927.6 12/04/2019: Magnesium 1.3; TSH 4.225 01/22/2020: ALT 10; BUN 29; Creatinine, Ser 1.52; Hemoglobin 10.8; Platelets 101.0; Potassium 4.2; Sodium 141   Lipid Panel    Component Value Date/Time   CHOL 120 11/07/2019 1146   TRIG 126.0 11/07/2019 1146   HDL 45.60 11/07/2019 1146   CHOLHDL 3 11/07/2019 1146   VLDL 25.2 11/07/2019 1146   LDLCALC 49 11/07/2019 1146   LDLDIRECT 57.0 10/22/2018 1451    Additional studies/ records that were reviewed today include:   Echocardiogram: 11/2019 IMPRESSIONS  1. No LV thrombus seen on contrast imaging. Left ventricular ejection  fraction, by estimation, is 20 to 25%. The left ventricle has severely  decreased  function. The left ventricle demonstrates global hypokinesis.  The left ventricular internal cavity  size was mildly dilated. Indeterminate diastolic filling due to E-A  fusion.  2. Right ventricular systolic function is normal. The right ventricular  size is normal. Tricuspid regurgitation signal is inadequate for assessing  PA pressure.  3. The mitral valve is grossly normal. Trivial mitral valve  regurgitation. No evidence of mitral stenosis.  4. The aortic valve is grossly normal. Aortic valve regurgitation is not  visualized. Mild aortic valve stenosis.   Comparison(s): A prior study was performed on 02/05/2018. Prior images  reviewed side by side. Changes from prior study are noted. EF now 20-25%  with severe global hypokinesis.   Assessment:    1. Chronic systolic heart failure ([place])   2. Essential hypertension   3. Hyperlipidemia associated with type 2 diabetes mellitus (HCC)   4. Stage 3a chronic kidney disease   5. Medication management      Plan:   In order of problems listed above:  1. Chronic Systolic CHF - he was found to have a reduced EF of 20-25% during his recent admission. His respiratory status continues to improve and he denies any chest discomfort. Appears euvolemic by examination.  - He is currently on Coreg 3.171m BID, Entresto 24-259mBID and Lasix 2034maily. Given recent rise in creatinine, I am hesitant to further titrate Entresto or add Spironolactone at this time. Will titrate Coreg to 6.4m67mD as HR is typically in the 80's to 110's when checked at home. I did ask for him to have repeat labs to reassess renal function.   - would ultimately plan for a repeat echocardiogram in 2-3 months. If EF remains reduced, he will require a cardiac catheterization to rule out an ischemic etiology and this was briefly reviewed with him today.   2. HTN - BP is well-controlled at 138/68 during today's visit. Continue Entresto at current dosing but will titrate  Coreg as outlined above in the setting of his cardiomyopathy.   3. HLD - followed by PCP. FLP in 10/2019 showed total cholesterol 120, HDL 45, triglycerides 126 and LDL 49. He remains on Crestor 10mg105mly.   4. Stage 3 CKD - baseline creatinine 1.2 - 1.3. Was elevated to 1.4 and 1.52 by most recent labs. I did recommend he have a repeat BMET for if this remains elevated, would reduce Lasix to 20mg 50my other day as he appears euvolemic by examination and Entresto does have a diuretic effect as well.     Medication Adjustments/Labs and Tests Ordered: Current medicines are reviewed at length with the patient today.  Concerns regarding medicines are outlined above.  Medication changes, Labs and Tests ordered today are listed in the Patient Instructions below. Patient Instructions  Medication Instructions:  INCREASE Coreg to 6.25 mg twice a day  *If you need a refill on your cardiac medications before your next appointment, please call your pharmacy*   Lab Work: BMET next week  If you have labs (blood work) drawn today and your tests are completely normal, you will receive your results only by: . MyChMarland Kitchenrt Message (if you have MyChart) OR . A paper copy in the mail If you have any lab test that is abnormal or we need to change your treatment, we will call you to review the results.  Testing/Procedures: None today   Follow-Up: At [facility], you and your health needs are our priority.  As part of our continuing mission to provide you with exceptional heart care, we have created designated Provider Care Teams.  These Care Teams include your primary Cardiologist (physician) and Advanced Practice Providers (APPs -  Physician Assistants and Nurse Practitioners) who all work together to provide you with the care you need, when you need it.  We recommend signing up for the patient portal called "MyChart".  Sign up information is provided on this After Visit Summary.  MyChart is used to  connect with patients for Virtual Visits (Telemedicine).  Patients are able to view lab/test results, encounter notes, upcoming appointments, etc.  Non-urgent messages can be sent to your provider as well.   To learn more about what you can do with MyChart, go to [email or web address].    Your next appointment:   4 week(s)  The format for your next appointment:   In Person  Provider:   Bernerd Pho, PA-C or Dr.McDowell   Thank you for choosing [facility] !       Other Instructions None    Signed, Erma Heritage, PA-C  01/28/2020 7:19 PM    [facility]. [address] [place], [place] [address] Phone: [phone] Fax: [phone]

## 2020-01-30 ENCOUNTER — Telehealth: Payer: Self-pay | Admitting: Cardiology

## 2020-01-30 NOTE — Telephone Encounter (Signed)
No notes in chart indicating phone call

## 2020-01-30 NOTE — Telephone Encounter (Signed)
Patient called stating that he was returning a call .States that someone called him the other day. He is having no problems.

## 2020-02-05 ENCOUNTER — Other Ambulatory Visit: Payer: Self-pay

## 2020-02-05 ENCOUNTER — Other Ambulatory Visit (INDEPENDENT_AMBULATORY_CARE_PROVIDER_SITE_OTHER): Payer: Medicare HMO

## 2020-02-05 DIAGNOSIS — N183 Chronic kidney disease, stage 3 unspecified: Secondary | ICD-10-CM

## 2020-02-05 LAB — BASIC METABOLIC PANEL
BUN: 17 mg/dL (ref 6–23)
CO2: 29 mEq/L (ref 19–32)
Calcium: 8.7 mg/dL (ref 8.4–10.5)
Chloride: 105 mEq/L (ref 96–112)
Creatinine, Ser: 1.07 mg/dL (ref 0.40–1.50)
GFR: 66.66 mL/min (ref >60.00)
Glucose, Bld: 151 mg/dL — ABNORMAL HIGH (ref 70–99)
Potassium: 4.6 mEq/L (ref 3.5–5.1)
Sodium: 141 mEq/L (ref 135–145)

## 2020-02-05 NOTE — Addendum Note (Signed)
Addended by: Christiana Fuchs on: 02/05/2020 02:04 PM   Modules accepted: Orders

## 2020-02-07 DIAGNOSIS — N179 Acute kidney failure, unspecified: Secondary | ICD-10-CM | POA: Diagnosis not present

## 2020-02-07 DIAGNOSIS — J9621 Acute and chronic respiratory failure with hypoxia: Secondary | ICD-10-CM | POA: Diagnosis not present

## 2020-02-07 DIAGNOSIS — I0981 Rheumatic heart failure: Secondary | ICD-10-CM | POA: Diagnosis not present

## 2020-02-07 DIAGNOSIS — I5023 Acute on chronic systolic (congestive) heart failure: Secondary | ICD-10-CM | POA: Diagnosis not present

## 2020-02-07 DIAGNOSIS — J449 Chronic obstructive pulmonary disease, unspecified: Secondary | ICD-10-CM | POA: Diagnosis not present

## 2020-02-07 DIAGNOSIS — N1831 Chronic kidney disease, stage 3a: Secondary | ICD-10-CM | POA: Diagnosis not present

## 2020-02-07 DIAGNOSIS — I130 Hypertensive heart and chronic kidney disease with heart failure and stage 1 through stage 4 chronic kidney disease, or unspecified chronic kidney disease: Secondary | ICD-10-CM | POA: Diagnosis not present

## 2020-02-07 DIAGNOSIS — I501 Left ventricular failure: Secondary | ICD-10-CM | POA: Diagnosis not present

## 2020-02-07 DIAGNOSIS — E1122 Type 2 diabetes mellitus with diabetic chronic kidney disease: Secondary | ICD-10-CM | POA: Diagnosis not present

## 2020-02-11 ENCOUNTER — Other Ambulatory Visit: Payer: Self-pay

## 2020-02-11 MED ORDER — FAMOTIDINE 20 MG PO TABS: 20.0000 mg | ORAL_TABLET | Freq: Every day | ORAL | 3 refills | Status: DC

## 2020-02-14 ENCOUNTER — Ambulatory Visit
Admission: RE | Admit: 2020-02-14 | Discharge: 2020-02-14 | Disposition: A | Discharge status: Home / Self Care | Payer: Medicare HMO | Source: Ambulatory Visit | Attending: Family Medicine | Admitting: Family Medicine

## 2020-02-14 ENCOUNTER — Other Ambulatory Visit: Payer: Self-pay

## 2020-02-14 DIAGNOSIS — K5730 Diverticulosis of large intestine without perforation or abscess without bleeding: Secondary | ICD-10-CM | POA: Diagnosis not present

## 2020-02-14 DIAGNOSIS — K7460 Unspecified cirrhosis of liver: Secondary | ICD-10-CM | POA: Diagnosis not present

## 2020-02-14 DIAGNOSIS — N2889 Other specified disorders of kidney and ureter: Secondary | ICD-10-CM

## 2020-02-14 MED ORDER — IOPAMIDOL (ISOVUE-300) INJECTION 61%
100.0000 mL | Freq: Once | INTRAVENOUS | Status: AC | PRN
  Administered 2020-02-14: 100 mL via INTRAVENOUS

## 2020-02-17 ENCOUNTER — Other Ambulatory Visit: Payer: Self-pay

## 2020-02-17 DIAGNOSIS — R1900 Intra-abdominal and pelvic swelling, mass and lump, unspecified site: Secondary | ICD-10-CM

## 2020-02-18 ENCOUNTER — Encounter: Payer: Self-pay | Admitting: Podiatry

## 2020-02-18 ENCOUNTER — Ambulatory Visit: Payer: Medicare HMO | Admitting: Podiatry

## 2020-02-18 ENCOUNTER — Other Ambulatory Visit: Payer: Self-pay

## 2020-02-18 DIAGNOSIS — B351 Tinea unguium: Secondary | ICD-10-CM | POA: Diagnosis not present

## 2020-02-18 DIAGNOSIS — L97412 Non-pressure chronic ulcer of right heel and midfoot with fat layer exposed: Secondary | ICD-10-CM | POA: Diagnosis not present

## 2020-02-18 DIAGNOSIS — E08621 Diabetes mellitus due to underlying condition with foot ulcer: Secondary | ICD-10-CM | POA: Diagnosis not present

## 2020-02-18 DIAGNOSIS — E1142 Type 2 diabetes mellitus with diabetic polyneuropathy: Secondary | ICD-10-CM | POA: Diagnosis not present

## 2020-02-18 MED ORDER — MUPIROCIN 2 % EX OINT: TOPICAL_OINTMENT | CUTANEOUS | 1 refills | Status: DC

## 2020-02-18 NOTE — Patient Instructions (Addendum)
DRESSING CHANGES RIGHT FOOT:  WEAR SURGICAL SHOE AT ALL TIMES!!!!!   1. KEEP RIGHT  FOOT DRY AT ALL TIMES!!!!  2. CLEANSE ULCER WITH SALINE.  3. DAB DRY WITH GAUZE SPONGE.  4. APPLY A LIGHT AMOUNT OF MUPIROCIN OINTMENT TO BASE OF ULCER.  5. APPLY OUTER BAND-AID AS INSTRUCTED.  6. WEAR SURGICAL SHOE DAILY AT ALL TIMES.  7. DO NOT WALK BAREFOOT!!!  8.  IF YOU EXPERIENCE ANY FEVER, CHILLS, NIGHTSWEATS, NAUSEA OR VOMITING, ELEVATED OR LOW BLOOD SUGARS, REPORT TO EMERGENCY ROOM.  9. IF YOU EXPERIENCE INCREASED REDNESS, PAIN, SWELLING, DISCOLORATION, ODOR, PUS, DRAINAGE OR WARMTH OF YOUR FOOT, REPORT TO EMERGENCY ROOM.  Diabetes Mellitus and Foot Care Foot care is an important part of your health, especially when you have diabetes. Diabetes may cause you to have problems because of poor blood flow (circulation) to your feet and legs, which can cause your skin to:  Become thinner and drier.  Break more easily.  Heal more slowly.  Peel and crack. You may also have nerve damage (neuropathy) in your legs and feet, causing decreased feeling in them. This means that you may not notice minor injuries to your feet that could lead to more serious problems. Noticing and addressing any potential problems early is the best way to prevent future foot problems. How to care for your feet Foot hygiene  Wash your feet daily with warm water and mild soap. Do not use hot water. Then, pat your feet and the areas between your toes until they are completely dry. Do not soak your feet as this can dry your skin.  Trim your toenails straight across. Do not dig under them or around the cuticle. File the edges of your nails with an emery board or nail file.  Apply a moisturizing lotion or petroleum jelly to the skin on your feet and to dry, brittle toenails. Use lotion that does not contain alcohol and is unscented. Do not apply lotion between your toes. Shoes and socks  Wear clean socks or stockings every  day. Make sure they are not too tight. Do not wear knee-high stockings since they may decrease blood flow to your legs.  Wear shoes that fit properly and have enough cushioning. Always look in your shoes before you put them on to be sure there are no objects inside.  To break in new shoes, wear them for just a few hours a day. This prevents injuries on your feet. Wounds, scrapes, corns, and calluses  Check your feet daily for blisters, cuts, bruises, sores, and redness. If you cannot see the bottom of your feet, use a mirror or ask someone for help.  Do not cut corns or calluses or try to remove them with medicine.  If you find a minor scrape, cut, or break in the skin on your feet, keep it and the skin around it clean and dry. You may clean these areas with mild soap and water. Do not clean the area with peroxide, alcohol, or iodine.  If you have a wound, scrape, corn, or callus on your foot, look at it several times a day to make sure it is healing and not infected. Check for: ? Redness, swelling, or pain. ? Fluid or blood. ? Warmth. ? Pus or a bad smell. General instructions  Do not cross your legs. This may decrease blood flow to your feet.  Do not use heating pads or hot water bottles on your feet. They may burn your skin. If  you have lost feeling in your feet or legs, you may not know this is happening until it is too late.  Protect your feet from hot and cold by wearing shoes, such as at the beach or on hot pavement.  Schedule a complete foot exam at least once a year (annually) or more often if you have foot problems. If you have foot problems, report any cuts, sores, or bruises to your health care provider immediately. Contact a health care provider if:  You have a medical condition that increases your risk of infection and you have any cuts, sores, or bruises on your feet.  You have an injury that is not healing.  You have redness on your legs or feet.  You feel burning  or tingling in your legs or feet.  You have pain or cramps in your legs and feet.  Your legs or feet are numb.  Your feet always feel cold.  You have pain around a toenail. Get help right away if:  You have a wound, scrape, corn, or callus on your foot and: ? You have pain, swelling, or redness that gets worse. ? You have fluid or blood coming from the wound, scrape, corn, or callus. ? Your wound, scrape, corn, or callus feels warm to the touch. ? You have pus or a bad smell coming from the wound, scrape, corn, or callus. ? You have a fever. ? You have a red line going up your leg. Summary  Check your feet every day for cuts, sores, red spots, swelling, and blisters.  Moisturize feet and legs daily.  Wear shoes that fit properly and have enough cushioning.  If you have foot problems, report any cuts, sores, or bruises to your health care provider immediately.  Schedule a complete foot exam at least once a year (annually) or more often if you have foot problems. This information is not intended to replace advice given to you by your health care provider. Make sure you discuss any questions you have with your health care provider. Document Revised: 06/19/2019 Document Reviewed: 10/28/2016 Elsevier Patient Education  [place] are small areas of thickened skin that occur on the top, sides, or tip of a toe. They contain a cone-shaped core with a point that can press on a nerve below. This causes pain.  Calluses are areas of thickened skin that can occur anywhere on the body, including the hands, fingers, palms, soles of the feet, and heels. Calluses are usually larger than corns. What are the causes? Corns and calluses are caused by rubbing (friction) or pressure, such as from shoes that are too tight or do not fit properly. What increases the risk? Corns are more likely to develop in people who have misshapen toes (toe deformities), such as  hammer toes. Calluses can occur with friction to any area of the skin. They are more likely to develop in people who:  Work with their hands.  Wear shoes that fit poorly, are too tight, or are high-heeled.  Have toe deformities. What are the signs or symptoms? Symptoms of a corn or callus include:  A hard growth on the skin.  Pain or tenderness under the skin.  Redness and swelling.  Increased discomfort while wearing tight-fitting shoes, if your feet are affected. If a corn or callus becomes infected, symptoms may include:  Redness and swelling that gets worse.  Pain.  Fluid, blood, or pus draining from the corn or callus. How  is this diagnosed? Corns and calluses may be diagnosed based on your symptoms, your medical history, and a physical exam. How is this treated? Treatment for corns and calluses may include:  Removing the cause of the friction or pressure. This may involve: ? Changing your shoes. ? Wearing shoe inserts (orthotics) or other protective layers in your shoes, such as a corn pad. ? Wearing gloves.  Applying medicine to the skin (topical medicine) to help soften skin in the hardened, thickened areas.  Removing layers of dead skin with a file to reduce the size of the corn or callus.  Removing the corn or callus with a scalpel or laser.  Taking antibiotic medicines, if your corn or callus is infected.  Having surgery, if a toe deformity is the cause. Follow these instructions at home:   Take over-the-counter and prescription medicines only as told by your health care provider.  If you were prescribed an antibiotic, take it as told by your health care provider. Do not stop taking it even if your condition starts to improve.  Wear shoes that fit well. Avoid wearing high-heeled shoes and shoes that are too tight or too loose.  Wear any padding, protective layers, gloves, or orthotics as told by your health care provider.  Soak your hands or feet and  then use a file or pumice stone to soften your corn or callus. Do this as told by your health care provider.  Check your corn or callus every day for symptoms of infection. Contact a health care provider if you:  Notice that your symptoms do not improve with treatment.  Have redness or swelling that gets worse.  Notice that your corn or callus becomes painful.  Have fluid, blood, or pus coming from your corn or callus.  Have new symptoms. Summary  Corns are small areas of thickened skin that occur on the top, sides, or tip of a toe.  Calluses are areas of thickened skin that can occur anywhere on the body, including the hands, fingers, palms, and soles of the feet. Calluses are usually larger than corns.  Corns and calluses are caused by rubbing (friction) or pressure, such as from shoes that are too tight or do not fit properly.  Treatment may include wearing any padding, protective layers, gloves, or orthotics as told by your health care provider. This information is not intended to replace advice given to you by your health care provider. Make sure you discuss any questions you have with your health care provider. Document Revised: 01/16/2019 Document Reviewed: 08/09/2017 Elsevier Patient Education  [place].  Peripheral Neuropathy Peripheral neuropathy is a type of nerve damage. It affects nerves that carry signals between the spinal cord and the arms, legs, and the rest of the body (peripheral nerves). It does not affect nerves in the spinal cord or brain. In peripheral neuropathy, one nerve or a group of nerves may be damaged. Peripheral neuropathy is a broad category that includes many specific nerve disorders, like diabetic neuropathy, hereditary neuropathy, and carpal tunnel syndrome. What are the causes? This condition may be caused by:  Diabetes. This is the most common cause of peripheral neuropathy.  Nerve injury.  Pressure or stress on a nerve that lasts a  long time.  Lack (deficiency) of B vitamins. This can result from alcoholism, poor diet, or a restricted diet.  Infections.  Autoimmune diseases, such as rheumatoid arthritis and systemic lupus erythematosus.  Nerve diseases that are passed from parent to child (inherited).  Some medicines, such as cancer medicines (chemotherapy).  Poisonous (toxic) substances, such as lead and mercury.  Too little blood flowing to the legs.  Kidney disease.  Thyroid disease. In some cases, the cause of this condition is not known. What are the signs or symptoms? Symptoms of this condition depend on which of your nerves is damaged. Common symptoms include:  Loss of feeling (numbness) in the feet, hands, or both.  Tingling in the feet, hands, or both.  Burning pain.  Very sensitive skin.  Weakness.  Not being able to move a part of the body (paralysis).  Muscle twitching.  Clumsiness or poor coordination.  Loss of balance.  Not being able to control your bladder.  Feeling dizzy.  Sexual problems. How is this diagnosed? Diagnosing and finding the cause of peripheral neuropathy can be difficult. Your health care provider will take your medical history and do a physical exam. A neurological exam will also be done. This involves checking things that are affected by your brain, spinal cord, and nerves (nervous system). For example, your health care provider will check your reflexes, how you move, and what you can feel. You may have other tests, such as:  Blood tests.  Electromyogram (EMG) and nerve conduction tests. These tests check nerve function and how well the nerves are controlling the muscles.  Imaging tests, such as CT scans or MRI to rule out other causes of your symptoms.  Removing a small piece of nerve to be examined in a lab (nerve biopsy). This is rare.  Removing and examining a small amount of the fluid that surrounds the brain and spinal cord (lumbar puncture). This  is rare. How is this treated? Treatment for this condition may involve:  Treating the underlying cause of the neuropathy, such as diabetes, kidney disease, or vitamin deficiencies.  Stopping medicines that can cause neuropathy, such as chemotherapy.  Medicine to relieve pain. Medicines may include: ? Prescription or over-the-counter pain medicine. ? Antiseizure medicine. ? Antidepressants. ? Pain-relieving patches that are applied to painful areas of skin.  Surgery to relieve pressure on a nerve or to destroy a nerve that is causing pain.  Physical therapy to help improve movement and balance.  Devices to help you move around (assistive devices). Follow these instructions at home: Medicines  Take over-the-counter and prescription medicines only as told by your health care provider. Do not take any other medicines without first asking your health care provider.  Do not drive or use heavy machinery while taking prescription pain medicine. Lifestyle   Do not use any products that contain nicotine or tobacco, such as cigarettes and e-cigarettes. Smoking keeps blood from reaching damaged nerves. If you need help quitting, ask your health care provider.  Avoid or limit alcohol. Too much alcohol can cause a vitamin B deficiency, and vitamin B is needed for healthy nerves.  Eat a healthy diet. This includes: ? Eating foods that are high in fiber, such as fresh fruits and vegetables, whole grains, and beans. ? Limiting foods that are high in fat and processed sugars, such as fried or sweet foods. General instructions   If you have diabetes, work closely with your health care provider to keep your blood sugar under control.  If you have numbness in your feet: ? Check every day for signs of injury or infection. Watch for redness, warmth, and swelling. ? Wear padded socks and comfortable shoes. These help protect your feet.  Develop a good support system. Living with peripheral  neuropathy can be stressful. Consider talking with a mental health specialist or joining a support group.  Use assistive devices and attend physical therapy as told by your health care provider. This may include using a walker or a cane.  Keep all follow-up visits as told by your health care provider. This is important. Contact a health care provider if:  You have new signs or symptoms of peripheral neuropathy.  You are struggling emotionally from dealing with peripheral neuropathy.  Your pain is not well-controlled. Get help right away if:  You have an injury or infection that is not healing normally.  You develop new weakness in an arm or leg.  You fall frequently. Summary  Peripheral neuropathy is when the nerves in the arms, or legs are damaged, resulting in numbness, weakness, or pain.  There are many causes of peripheral neuropathy, including diabetes, pinched nerves, vitamin deficiencies, autoimmune disease, and hereditary conditions.  Diagnosing and finding the cause of peripheral neuropathy can be difficult. Your health care provider will take your medical history, do a physical exam, and do tests, including blood tests and nerve function tests.  Treatment involves treating the underlying cause of the neuropathy and taking medicines to help control pain. Physical therapy and assistive devices may also help. This information is not intended to replace advice given to you by your health care provider. Make sure you discuss any questions you have with your health care provider. Document Revised: 09/08/2017 Document Reviewed: 12/05/2016 Elsevier Patient Education  2020 [employer].

## 2020-02-20 NOTE — Addendum Note (Signed)
Addended by: Thomes Cake on: 02/20/2020 08:19 AM   Modules accepted: Orders

## 2020-02-23 DIAGNOSIS — J449 Chronic obstructive pulmonary disease, unspecified: Secondary | ICD-10-CM | POA: Diagnosis not present

## 2020-02-23 DIAGNOSIS — J9611 Chronic respiratory failure with hypoxia: Secondary | ICD-10-CM | POA: Diagnosis not present

## 2020-02-25 ENCOUNTER — Ambulatory Visit (INDEPENDENT_AMBULATORY_CARE_PROVIDER_SITE_OTHER): Payer: Medicare HMO | Admitting: Family Medicine

## 2020-02-25 ENCOUNTER — Encounter: Payer: Self-pay | Admitting: Family Medicine

## 2020-02-25 ENCOUNTER — Other Ambulatory Visit: Payer: Self-pay

## 2020-02-25 VITALS — BP 126/82 | HR 90 | Temp 97.7°F | Ht 72.0 in | Wt 204.8 lb

## 2020-02-25 DIAGNOSIS — I10 Essential (primary) hypertension: Secondary | ICD-10-CM | POA: Diagnosis not present

## 2020-02-25 DIAGNOSIS — F419 Anxiety disorder, unspecified: Secondary | ICD-10-CM

## 2020-02-25 DIAGNOSIS — I5022 Chronic systolic (congestive) heart failure: Secondary | ICD-10-CM

## 2020-02-25 DIAGNOSIS — E785 Hyperlipidemia, unspecified: Secondary | ICD-10-CM | POA: Diagnosis not present

## 2020-02-25 DIAGNOSIS — E1169 Type 2 diabetes mellitus with other specified complication: Secondary | ICD-10-CM | POA: Diagnosis not present

## 2020-02-25 DIAGNOSIS — E1142 Type 2 diabetes mellitus with diabetic polyneuropathy: Secondary | ICD-10-CM | POA: Diagnosis not present

## 2020-02-25 DIAGNOSIS — R197 Diarrhea, unspecified: Secondary | ICD-10-CM | POA: Diagnosis not present

## 2020-02-25 LAB — POCT GLYCOSYLATED HEMOGLOBIN (HGB A1C): Hemoglobin A1C: 5.9 % — AB (ref 4.0–5.6)

## 2020-02-25 MED ORDER — BUSPIRONE HCL 5 MG PO TABS: 5.0000 mg | ORAL_TABLET | Freq: Two times a day (BID) | ORAL | 5 refills | Status: DC | PRN

## 2020-02-25 NOTE — Progress Notes (Signed)
Phone [phone] In person visit   Subjective:   [name] is a 79 y.o. year old very pleasant male patient who presents for/with See problem oriented charting Chief Complaint  Patient presents with  . Diarrhea    Patient mentioned that almost after every meal he experiences some diarrhea.     This visit occurred during the SARS-CoV-2 public health emergency.  Safety protocols were in place, including screening questions prior to the visit, additional usage of staff PPE, and extensive cleaning of exam room while observing appropriate contact time as indicated for disinfecting solutions.   Past Medical History-  Patient Active Problem List   Diagnosis Date Noted  . 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[address]) 12/14/2019    Priority: High  . Chronic systolic heart failure ([place]) 12/05/2019    Priority: High  . Thrombocytopenia ([place]) 12/21/2015    Priority: High  . 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 ILD (interstitial lung disease) ([place]) 07/05/2012    Priority: High  . COPD (chronic obstructive pulmonary disease) ([place]) 12/15/2009    Priority: High  . Chronic back pain. Off narcotics 06/05/17 due to negative UDS for opiates x2. Full history 06/12/14. Pain contract signed.  05/02/2007    Priority: High  . DM (diabetes mellitus) type II controlled, neurological manifestation ([place]) 04/06/2007    Priority: High  . B12 deficiency 08/20/2015    Priority: Medium  . Diabetic polyneuropathy associated with type 2 diabetes mellitus ([place]) 08/07/2015    Priority: Medium  . CKD (chronic kidney disease), stage III 05/06/2015    Priority: Medium  . Fatty liver 11/04/2014    Priority: Medium  . BPH (benign prostatic hyperplasia) 06/20/2007    Priority: Medium  . Depression 05/02/2007    Priority: Medium  . Hyperlipidemia associated with type 2 diabetes mellitus ([place]) 04/06/2007    Priority: Medium  . Essential hypertension 04/06/2007    Priority: Medium  .  Osteoarthritis 04/06/2007    Priority: Medium  . Aortic atherosclerosis ([address]) 12/14/2019    Priority: Low  . GERD (gastroesophageal reflux disease) 09/30/2017    Priority: Low  . 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 positive 03/29/2016    Priority: Low  . 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 Anxiety 02/25/2020  . Diabetic ulcer of right midfoot associated with diabetes mellitus due to underlying condition, with fat layer exposed ([place]) 11/07/2019  . Anemia 03/05/2018  . Diplopia 06/05/2015  . PCO (posterior capsular opacification) 06/19/2013  . 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 Pseudophakia of left eye 04/10/2013  . Nuclear cataract 01/20/2012  . 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 acyclovir (ZOVIRAX) 400 MG tablet TAKE 1 TABLET TWICE DAILY 180 tablet 1  . albuterol (VENTOLIN HFA) 108 (90 Base) MCG/ACT inhaler Inhale 2 puffs into the lungs every 6 (six) hours as needed for wheezing or shortness of breath. 6.7 g 3  . amitriptyline (ELAVIL) 50 MG tablet     . ASCORBIC ACID PO Take 1 tablet by mouth daily.     Marland Kitchen aspirin EC 81 MG tablet Take 81 mg by mouth daily.    . Blood Glucose Monitoring Suppl (TRUE METRIX AIR GLUCOSE METER) w/Device KIT     . carvedilol (COREG) 6.25 MG tablet Take 1 tablet (6.25 mg total) by mouth 2 (two) times daily. 180 tablet 3  . Cholecalciferol 1000 UNITS capsule Take  1 capsule (1,000 Units total) by mouth 2 (two) times daily.    . clotrimazole-betamethasone (LOTRISONE) cream Apply 1 application topically 2 (two) times daily. For 7 days maximum for severe jock itch . Stop if worsening symptoms. 45 g 1  . escitalopram (LEXAPRO) 5 MG tablet Take 1 tablet (5 mg total) by mouth daily. 90 tablet 5  . famotidine (PEPCID) 20 MG tablet Take 1 tablet (20 mg total) by mouth daily. 90 tablet 3  . finasteride (PROSCAR) 5 MG tablet TAKE 1 TABLET EVERY  DAY (Patient taking differently: Take 5 mg by mouth daily. ) 90 tablet 1  . gabapentin (NEURONTIN) 300 MG capsule Take 1 capsule (300 mg total) by mouth 2 (two) times daily. 180 capsule 3  . glucose blood (TRUE METRIX BLOOD GLUCOSE TEST) test strip TEST BLOOD SUGAR EVERY DAY 100 strip 1  . metFORMIN (GLUCOPHAGE) 500 MG tablet TAKE 1 TABLET EVERY DAY WITH BREAKFAST (Patient taking differently: Take 500 mg by mouth every evening. ) 90 tablet 3  . Multiple Vitamin (MULTIVITAMIN) capsule Take 1 capsule by mouth daily.      . mupirocin ointment (BACTROBAN) 2 % Apply to right foot ulcer once daily and cover with dressing. 30 g 1  . NONFORMULARY OR COMPOUNDED ITEM [facility]  Peripheral Neuropathy Cream- Bupivacaine 1%, Doxepin 3%, Gabapentin 6%, Pentoxifylline 3%, Topiramate 1% Apply 1-2 grams to affected area 3-4 times daily Qty. 120 gm 3 refills    . omeprazole (PRILOSEC) 20 MG capsule TAKE 1 CAPSULE DAILY 30 TO 60 MINUTES BEFORE FIRST MEAL OF THE DAY (Patient taking differently: Take 20 mg by mouth daily. ) 90 capsule 1  . rosuvastatin (CRESTOR) 10 MG tablet Take 1 tablet (10 mg total) by mouth daily. 90 tablet 3  . sacubitril-valsartan (ENTRESTO) 24-26 MG Take 1 tablet by mouth 2 (two) times daily. 60 tablet 3  . sodium chloride (MURO 128) 5 % ophthalmic solution Place 1 drop into the left eye at bedtime.    . tamsulosin (FLOMAX) 0.4 MG CAPS capsule TAKE 1 CAPSULE EVERY DAY (Patient taking differently: Take 0.4 mg by mouth daily. ) 90 capsule 1  . TRUEPLUS LANCETS 28G MISC Use to test blood sugars two times daily. Dx: E11.9 200 each 3  . busPIRone (BUSPAR) 5 MG tablet Take 1 tablet (5 mg total) by mouth 2 (two) times daily as needed (anxiety). 60 tablet 5  . furosemide (LASIX) 20 MG tablet Take 1 tablet (20 mg total) by mouth daily. 90 tablet 1   No current facility-administered medications for this visit.     Objective:  BP 126/82   Pulse 90   Temp 97.7 F (36.5 C) (Temporal)   Ht  6' (1.829 m)   Wt 204 lb 12.8 oz (92.9 kg)   SpO2 96%   BMI 27.78 kg/m  Gen: NAD, resting comfortably CV: RRR  Lungs: CTAB no crackles, wheeze, rhonchi Ext: no edema Skin: warm, dry    Assessment and Plan   #Liver mass 4 CM-"lobulated renal parenchyma versus less likely left renal mass" based off abdominal ultrasound-we ordered CT scan and showed 4 cm mass in liver- we are getting MR abdomen set up   # Diabetes #Diarrhea S: Medication:metformin 558m every evening     Patient states that almost after every meal he experiences some diarrhea. He states this has been going on for a few yeras  Last month he was seen for nausea and vomiting and shortness of breath approximately a month after  heart failure hospitalization.  CMP showed stable chronic kidney disease stage III and largely normal LFTs.  CBC showed leukocytosis, macrocytosis, high platelets, anemia-we strongly recommended oncology follow-up at that time but he declined-he actually had been scheduled with hematology/oncology prior to that visit and he missed the visit-he declined to reschedule low.  Medication changes after most recent hospitalization included adding Entresto.  Patient denies adding any other medications   Of note patient does take Metformin 500 mg every evening which could contribute.  Lab Results  Component Value Date   HGBA1C 5.9 (A) 02/25/2020   HGBA1C 5.9 (A) 11/07/2019   HGBA1C 6.6 (A) 02/22/2019   A/P: Diabetes is well controlled with A1c of 5.9 today on point-of-care machine-this would likely adjust on phlebotomy to approximately 6.4. -We are going to stop Metformin and see how he does with diarrhea over the next few weeks-if he does not improve he is going to let me know. -If he does not improve I would like to do stool studies to include stool culture, C. difficile, fecal lactoferrin -He can also try a dose of Imodium as needed over-the-counter - he preferred to check in 3-4 weeks - he also states  in 3-4 weeks hed be ok reconsidering hem/onc visit   #hypertension S: medication: coreg 6.46m BID, lasix 270m entresto BP Readings from Last 3 Encounters:  02/25/20 126/82  01/28/20 138/68  01/22/20 118/68  A/P: Stable. Continue current medications.    #hyperlipidemia S: Medication:rosuvastatin  1024mLab Results  Component Value Date   CHOL 120 11/07/2019   HDL 45.60 11/07/2019   LDLCALC 49 11/07/2019   LDLDIRECT 57.0 10/22/2018   TRIG 126.0 11/07/2019   CHOLHDL 3 11/07/2019   A/P: good control on last check- continue current meds   # anxiety- some frustration with caretaking after wife had stroke. Asks for something to help with anxiety - will try low dose buspirone 5mg13mice daily  # CHF- compliant with lasix. Weight up slightly. No edema. Continue to monitor.  Wt Readings from Last 3 Encounters:  02/25/20 204 lb 12.8 oz (92.9 kg)  01/28/20 200 lb (90.7 kg)  01/22/20 198 lb (89.8 kg)   Recommended follow up: Return in about 4 weeks (around 03/24/2020) for follow up- or sooner if needed. Future Appointments  Date Time Provider DepaLonaconing24/2021  4:00 PM GalaMarzetta BoardM [place]  03/06/2020  3:00 PM Strader, BritFransisco Hertz-C [facility] [place] [facility]    Lab/Order associations:   ICD-10-CM   1. Diarrhea, unspecified type  R19.7   2. Anxiety  F41.9   3. Controlled type 2 diabetes mellitus with diabetic polyneuropathy, without long-term current use of insulin (HCC)  E11.42 POCT glycosylated hemoglobin (Hb A1C)  4. Essential hypertension  I10   5. Hyperlipidemia associated with type 2 diabetes mellitus (HCC)  E11.69    E78.5   6. Chronic systolic heart failure (HCC)  I50.22    Return precautions advised.  StepGarret Reddish

## 2020-02-25 NOTE — Patient Instructions (Addendum)
Health Maintenance Due  Topic Date Due  . TETANUS/TDAP - consider getting this at your pharmacy 06/10/2016  . OPHTHALMOLOGY EXAM - Sign release of information at the check out desk for last eye exam  01/25/2019  . URINE MICROALBUMIN - lets plan on this next time since you cant pee today 01/30/2019  . FOOT EXAM - next visit- slide your shoes and socks off when you come in 02/20/2020   -We are going to stop Metformin and see how he does with diarrhea over the next few weeks-if he does not improve he is going to let me know. -If he does not improve I would like to do stool studies to include stool culture, C. difficile, fecal lactoferrin -He can also try a dose of Imodium as needed over-the-counter  Team please update him on MRI scheduling   Recommended follow up: Return in about 4 weeks (around 03/24/2020) for follow up- or sooner if needed.

## 2020-02-25 NOTE — Assessment & Plan Note (Signed)
#   anxiety- some frustration with caretaking after wife had stroke. Asks for something to help with anxiety - will try low dose buspirone 5mg  twice daily

## 2020-02-27 NOTE — Progress Notes (Signed)
Subjective:  Patient ID: [name], male    DOB: 06-06-1941,  MRN: [id number]  [name] is seen today for at risk foot care with history of diabetic neuropathy, CKD, and painful mycotic nails b/l that are difficult to trim. Pain interferes with ambulation. Aggravating factors include wearing enclosed shoe gear. Pain is relieved with periodic professional debridement.  He has h/o ulcer submet head 1 right foot.  He states he has been in the hospital. "I almost didn't make it, Doc."  He states he has some discomfort under right foot callus. Also relates injury to foot attempting to help a family member. Denies any drainage in shoe/sock. Denies any fever, chills, night sweats, nausea or vomiting.  Past Medical History:  Diagnosis Date  . Benign prostatic hypertrophy   . COPD (chronic obstructive pulmonary disease) ([place]) December 15, 2009   FEV1 2.30 (70%) ratio 63 no better with B2 and DLCO 18/6 (73%) corrects to 106%  . Degenerative joint disease   . Depression   . Diverticulosis   . Essential hypertension    ACE inhibitor cough  . Fatty liver   . Hyperlipidemia   . Internal hemorrhoids   . Low back pain   . Otitis externa 07/30/2012  . Splenomegaly   . Type 2 diabetes mellitus ([place])   . Ulcer of foot ([place])    Right foot     Past Surgical History:  Procedure Laterality Date  . NO PAST SURGERIES  1980     Current Outpatient Medications on File Prior to Visit  Medication Sig Dispense Refill  . acyclovir (ZOVIRAX) 400 MG tablet TAKE 1 TABLET TWICE DAILY 180 tablet 1  . albuterol (VENTOLIN HFA) 108 (90 Base) MCG/ACT inhaler Inhale 2 puffs into the lungs every 6 (six) hours as needed for wheezing or shortness of breath. 6.7 g 3  . amitriptyline (ELAVIL) 50 MG tablet     . ASCORBIC ACID PO Take 1 tablet by mouth daily.     Marland Kitchen aspirin EC 81 MG tablet Take 81 mg by mouth daily.    . Blood Glucose Monitoring Suppl (TRUE METRIX AIR GLUCOSE METER) w/Device KIT     . carvedilol  (COREG) 6.25 MG tablet Take 1 tablet (6.25 mg total) by mouth 2 (two) times daily. 180 tablet 3  . Cholecalciferol 1000 UNITS capsule Take 1 capsule (1,000 Units total) by mouth 2 (two) times daily.    . clotrimazole-betamethasone (LOTRISONE) cream Apply 1 application topically 2 (two) times daily. For 7 days maximum for severe jock itch . Stop if worsening symptoms. 45 g 1  . escitalopram (LEXAPRO) 5 MG tablet Take 1 tablet (5 mg total) by mouth daily. 90 tablet 5  . famotidine (PEPCID) 20 MG tablet Take 1 tablet (20 mg total) by mouth daily. 90 tablet 3  . finasteride (PROSCAR) 5 MG tablet TAKE 1 TABLET EVERY DAY (Patient taking differently: Take 5 mg by mouth daily. ) 90 tablet 1  . gabapentin (NEURONTIN) 300 MG capsule Take 1 capsule (300 mg total) by mouth 2 (two) times daily. 180 capsule 3  . glucose blood (TRUE METRIX BLOOD GLUCOSE TEST) test strip TEST BLOOD SUGAR EVERY DAY 100 strip 1  . metFORMIN (GLUCOPHAGE) 500 MG tablet TAKE 1 TABLET EVERY DAY WITH BREAKFAST (Patient taking differently: Take 500 mg by mouth every evening. ) 90 tablet 3  . Multiple Vitamin (MULTIVITAMIN) capsule Take 1 capsule by mouth daily.      . NONFORMULARY OR COMPOUNDED [place]  Peripheral Neuropathy Cream- Bupivacaine 1%, Doxepin 3%, Gabapentin 6%, Pentoxifylline 3%, Topiramate 1% Apply 1-2 grams to affected area 3-4 times daily Qty. 120 gm 3 refills    . omeprazole (PRILOSEC) 20 MG capsule TAKE 1 CAPSULE DAILY 30 TO 60 MINUTES BEFORE FIRST MEAL OF THE DAY (Patient taking differently: Take 20 mg by mouth daily. ) 90 capsule 1  . rosuvastatin (CRESTOR) 10 MG tablet Take 1 tablet (10 mg total) by mouth daily. 90 tablet 3  . sacubitril-valsartan (ENTRESTO) 24-26 MG Take 1 tablet by mouth 2 (two) times daily. 60 tablet 3  . sodium chloride (MURO 128) 5 % ophthalmic solution Place 1 drop into the left eye at bedtime.    . tamsulosin (FLOMAX) 0.4 MG CAPS capsule TAKE 1 CAPSULE EVERY DAY (Patient taking  differently: Take 0.4 mg by mouth daily. ) 90 capsule 1  . TRUEPLUS LANCETS 28G MISC Use to test blood sugars two times daily. Dx: E11.9 200 each 3  . furosemide (LASIX) 20 MG tablet Take 1 tablet (20 mg total) by mouth daily. 90 tablet 1   No current facility-administered medications on file prior to visit.     No Known Allergies   Family History  Problem Relation Age of Onset  . Melanoma Mother   . Stroke Father   . CAD Brother        Premature disease     Social History   Occupational History  . Occupation: retired    Comment: maintenence work  Tobacco Use  . Smoking status: Former Smoker    Packs/day: 4.00    Years: 20.00    Pack years: 80.00    Types: Cigarettes    Quit date: 09/18/1984    Years since quitting: 35.4  . Smokeless tobacco: Never Used  . Tobacco comment: quit in 1980  Substance and Sexual Activity  . 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 Drug use: No  . Sexual activity: Yes     Immunization History  Administered Date(s) Administered  . Influenza Split 09/07/2011, 07/30/2012  . Influenza Whole 10/10/2005, 08/31/2009, 07/10/2010  . Influenza, High Dose Seasonal PF 06/22/2016, 06/29/2017, 09/20/2018, 07/11/2019  . Influenza,inj,Quad PF,6+ Mos 07/01/2013, 06/20/2014  . Influenza-Unspecified 10/26/2015, 09/12/2018  . Meningococcal Conjugate 07/24/2014  . Moderna SARS-COVID-2 Vaccination 10/22/2019, 12/02/2019  . Pneumococcal Conjugate-13 07/24/2014  . Pneumococcal Polysaccharide-23 08/31/2009  . Td 06/10/2006  . Zoster Recombinat (Shingrix) 12/11/2018, 05/03/2019     Objective:  Physical Exam: There were no vitals filed for this visit.   79 y.o. male WD, WN in NAD. AAO x 3.  Examination: Neurovascular status unchanged b/l. Capillary fill time to digits <3 seconds b/l. Palpable DP pulses b/l. Palpable PT pulses b/l. Pedal hair absent b/l Skin temperature gradient within normal limits b/l.  Pedal skin with normal  turgor, texture and tone bilaterally. No interdigital macerations bilaterally. Toenails 1-5 b/l elongated, dystrophic, thickened, crumbly with subungual debris and tenderness to dorsal palpation.    Wound Location: submet head 1 right foot There is a minimal amount of non-viable hyperkeratosis of roof of wound. Predebridement Wound Measurement: 2.5 x 2.9 cm with hyperkeratotic roof with subdermal hemorrhage. Postdebridement Wound Measurement: 0.5 x 0.1 x 0.1 cm. Wound Base: Granular/Healthy Peri-wound: Normal Exudate: None: wound tissue dry Blood Loss during debridement: 0 cc's. Description of tissue removed from ulceration today:  Hyperkeratotic. Signs of clinical bacterial infection are absent. Material in wound which inhibits healing/promotes adjacent tissue breakdown:  hyperkeratosis  Normal muscle strength 5/5 to all lower extremity muscle groups bilaterally. No pain crepitus or joint limitation noted with ROM b/l. Hammertoes noted to the 1-5 bilaterally.  Protective sensation diminished with 10g monofilament b/l. Vibratory sensation diminished b/l.  Hemoglobin A1C Latest Ref Rng & Units 02/25/2020 11/07/2019  HGBA1C 4.0 - 5.6 % 5.9(A) 5.9(A)  Some recent data might be hidden   Assessment:   1. Onychomycosis   2. Diabetic ulcer of right midfoot associated with diabetes mellitus due to underlying condition, with fat layer exposed ([place])   3. Diabetic peripheral neuropathy associated with type 2 diabetes mellitus ([place])    Plan:  -Patient was evaluated and treated and all questions answered.  -Patient  educated on diagnosis and treatment plan of routine ulcer debridement/wound care.  -Examined patient. -Ulcer submet head 1 debrided with sterile scalpel blade and cleansed with wound cleanser. -Triple antibiotic ointment and band-aid applied. -Amount of devitalized tissue removed: Minimal. -Ulcer submet head 1 right foot responded well to today's debridement. Keep right foot dry.  -Written instructions dispensed for daily Mupirocin Ointment dressing changes. Patient has surgical shoe at home. He is to wear daily on right foot. -Toenails 1-5 b/l were debrided in length and girth with sterile nail nippers and dremel without iatrogenic bleeding.  -Patient to continue soft, supportive shoe gear daily. -Patient to report any pedal injuries to medical professional immediately. -Patient/POA to call should there be question/concern in the interim. -Patient risk factors affecting healing of ulcer: diabetes, neuropathy, foot deformity -Frequency of debridement needed to achieve healing: every 2 weeks. -Patient to report to ED with any worsening of foot symptoms such as increased redness, drainage, odor, abnormal warmth/coolness. -Patient to report to ED with fever, chills, night sweats, nausea or vomiting.  Return in about 2 weeks (around 03/03/2020) for diabetic nail trim.  Marzetta Board, DPM

## 2020-03-02 ENCOUNTER — Other Ambulatory Visit: Payer: Self-pay

## 2020-03-02 ENCOUNTER — Ambulatory Visit: Payer: Medicare HMO | Admitting: Podiatry

## 2020-03-02 ENCOUNTER — Encounter: Payer: Self-pay | Admitting: Podiatry

## 2020-03-02 DIAGNOSIS — Z8631 Personal history of diabetic foot ulcer: Secondary | ICD-10-CM | POA: Diagnosis not present

## 2020-03-02 DIAGNOSIS — E1142 Type 2 diabetes mellitus with diabetic polyneuropathy: Secondary | ICD-10-CM | POA: Diagnosis not present

## 2020-03-02 NOTE — Patient Instructions (Addendum)
Bring your diabetic shoes in on next visit.    Diabetes Mellitus and Foot Care Foot care is an important part of your health, especially when you have diabetes. Diabetes may cause you to have problems because of poor blood flow (circulation) to your feet and legs, which can cause your skin to:  Become thinner and drier.  Break more easily.  Heal more slowly.  Peel and crack. You may also have nerve damage (neuropathy) in your legs and feet, causing decreased feeling in them. This means that you may not notice minor injuries to your feet that could lead to more serious problems. Noticing and addressing any potential problems early is the best way to prevent future foot problems. How to care for your feet Foot hygiene  Wash your feet daily with warm water and mild soap. Do not use hot water. Then, pat your feet and the areas between your toes until they are completely dry. Do not soak your feet as this can dry your skin.  Trim your toenails straight across. Do not dig under them or around the cuticle. File the edges of your nails with an emery board or nail file.  Apply a moisturizing lotion or petroleum jelly to the skin on your feet and to dry, brittle toenails. Use lotion that does not contain alcohol and is unscented. Do not apply lotion between your toes. Shoes and socks  Wear clean socks or stockings every day. Make sure they are not too tight. Do not wear knee-high stockings since they may decrease blood flow to your legs.  Wear shoes that fit properly and have enough cushioning. Always look in your shoes before you put them on to be sure there are no objects inside.  To break in new shoes, wear them for just a few hours a day. This prevents injuries on your feet. Wounds, scrapes, corns, and calluses  Check your feet daily for blisters, cuts, bruises, sores, and redness. If you cannot see the bottom of your feet, use a mirror or ask someone for help.  Do not cut corns or  calluses or try to remove them with medicine.  If you find a minor scrape, cut, or break in the skin on your feet, keep it and the skin around it clean and dry. You may clean these areas with mild soap and water. Do not clean the area with peroxide, alcohol, or iodine.  If you have a wound, scrape, corn, or callus on your foot, look at it several times a day to make sure it is healing and not infected. Check for: ? Redness, swelling, or pain. ? Fluid or blood. ? Warmth. ? Pus or a bad smell. General instructions  Do not cross your legs. This may decrease blood flow to your feet.  Do not use heating pads or hot water bottles on your feet. They may burn your skin. If you have lost feeling in your feet or legs, you may not know this is happening until it is too late.  Protect your feet from hot and cold by wearing shoes, such as at the beach or on hot pavement.  Schedule a complete foot exam at least once a year (annually) or more often if you have foot problems. If you have foot problems, report any cuts, sores, or bruises to your health care provider immediately. Contact a health care provider if:  You have a medical condition that increases your risk of infection and you have any cuts, sores, or bruises  on your feet.  You have an injury that is not healing.  You have redness on your legs or feet.  You feel burning or tingling in your legs or feet.  You have pain or cramps in your legs and feet.  Your legs or feet are numb.  Your feet always feel cold.  You have pain around a toenail. Get help right away if:  You have a wound, scrape, corn, or callus on your foot and: ? You have pain, swelling, or redness that gets worse. ? You have fluid or blood coming from the wound, scrape, corn, or callus. ? Your wound, scrape, corn, or callus feels warm to the touch. ? You have pus or a bad smell coming from the wound, scrape, corn, or callus. ? You have a fever. ? You have a red line  going up your leg. Summary  Check your feet every day for cuts, sores, red spots, swelling, and blisters.  Moisturize feet and legs daily.  Wear shoes that fit properly and have enough cushioning.  If you have foot problems, report any cuts, sores, or bruises to your health care provider immediately.  Schedule a complete foot exam at least once a year (annually) or more often if you have foot problems. This information is not intended to replace advice given to you by your health care provider. Make sure you discuss any questions you have with your health care provider. Document Revised: 06/19/2019 Document Reviewed: 10/28/2016 Elsevier Patient Education  [place].

## 2020-03-04 ENCOUNTER — Other Ambulatory Visit: Payer: Self-pay | Admitting: Family Medicine

## 2020-03-06 ENCOUNTER — Ambulatory Visit: Payer: Medicare HMO | Admitting: Student

## 2020-03-06 ENCOUNTER — Encounter: Payer: Self-pay | Admitting: Student

## 2020-03-06 ENCOUNTER — Other Ambulatory Visit: Payer: Self-pay

## 2020-03-06 VITALS — BP 140/80 | HR 88 | Ht 73.0 in | Wt 206.0 lb

## 2020-03-06 DIAGNOSIS — I5022 Chronic systolic (congestive) heart failure: Secondary | ICD-10-CM

## 2020-03-06 DIAGNOSIS — E785 Hyperlipidemia, unspecified: Secondary | ICD-10-CM

## 2020-03-06 DIAGNOSIS — K7030 Alcoholic cirrhosis of liver without ascites: Secondary | ICD-10-CM | POA: Diagnosis not present

## 2020-03-06 DIAGNOSIS — E1169 Type 2 diabetes mellitus with other specified complication: Secondary | ICD-10-CM

## 2020-03-06 DIAGNOSIS — N1831 Chronic kidney disease, stage 3a: Secondary | ICD-10-CM | POA: Diagnosis not present

## 2020-03-06 DIAGNOSIS — I350 Nonrheumatic aortic (valve) stenosis: Secondary | ICD-10-CM

## 2020-03-06 DIAGNOSIS — I10 Essential (primary) hypertension: Secondary | ICD-10-CM

## 2020-03-06 MED ORDER — SACUBITRIL-VALSARTAN 49-51 MG PO TABS: 1.0000 | ORAL_TABLET | Freq: Two times a day (BID) | ORAL | 11 refills | Status: DC

## 2020-03-06 MED ORDER — SACUBITRIL-VALSARTAN 49-51 MG PO TABS: 1.0000 | ORAL_TABLET | Freq: Two times a day (BID) | ORAL | 3 refills | Status: DC

## 2020-03-06 NOTE — Progress Notes (Signed)
Cardiology Office Note    Date:  03/07/2020   ID:  [name], [name] [birth year], MRN [id number]  PCP:  [name], MD  Cardiologist: Rozann Lesches, MD    Chief Complaint  Patient presents with  . Follow-up    39-monthvisit    History of Present Illness:    Joshua FAWVERis a 79y.o. male with past medical history of chronic systolic CHF (EF 284-12%by echo in 11/2019), HTN, HLD, Type 2 DM, Stage 3 CKD, liver cirrhosis, thrombocytopenia and COPD who presents to the office today for 1 month follow-up.  He was last examined by myself on 01/28/2020 and reported still having dyspnea on exertion but symptoms had improved. He denied any associated chest pain or palpitations. He was currently taking Coreg 3.125 mg twice daily, Entresto 24-26 mg twice daily and Lasix 20 mg daily. Given recent rise in creatinine, Entresto was not adjusted but Coreg was titrated to 6.25 mg twice daily. Labs on 02/05/2020 showed creatinine had improved to 1.07 with electrolytes within a normal range.  In talking with the patient today, he reports still having dyspnea on exertion which occurs with minimal activity. He also reports baseline orthopnea and sleeps with the head of his bed elevated. He denies any specific abdominal distention or lower extremity edema. Weight has overall been stable on his home scales. He does describe some episodes of chest discomfort but this occurs when he cannot catch his breath. Denies any specific exertional chest pain.  He did have a CT scan of his abdomen performed earlier this month which showed hepatic cirrhosis and he was found to have a 4 cm hypervascular mass in the left hepatic lobe and a nodule in the right posterior lobe which were suspicious for hepatocellular carcinomas and an abdominal MRI was recommended. This is scheduled for 03/27/2020.  Past Medical History:  Diagnosis Date  . Benign prostatic hypertrophy   . COPD (chronic obstructive pulmonary disease)  [place]   FEV1 2.30 (70%) ratio 63 no better with B2 and DLCO 18/6 (73%) corrects to 106%  . Degenerative joint disease   . Depression   . Diverticulosis   . Essential hypertension    ACE inhibitor cough  . Fatty liver   . Hyperlipidemia   . Internal hemorrhoids   . Low back pain   . Otitis externa 07/30/2012  . Splenomegaly   . Type 2 diabetes mellitus ([place]   . [place]cer of [place] ([place]    Right foot    Past Surgical History:  Procedure Laterality Date  . NO PAST SURGERIES  1980    Current Medications: Outpatient Medications Prior to Visit  Medication Sig Dispense Refill  . acyclovir (ZOVIRAX) 400 MG tablet TAKE 1 TABLET TWICE DAILY 180 tablet 1  . albuterol (VENTOLIN HFA) 108 (90 Base) MCG/ACT inhaler Inhale 2 puffs into the lungs every 6 (six) hours as needed for wheezing or shortness of breath. 6.7 g 3  . amitriptyline (ELAVIL) 50 MG tablet TAKE 1 TABLET AT BEDTIME 90 tablet 1  . ASCORBIC ACID PO Take 1 tablet by mouth daily.     .Marland Kitchenaspirin EC 81 MG tablet Take 81 mg by mouth daily.    . Blood Glucose Monitoring Suppl (TRUE METRIX AIR GLUCOSE METER) w/Device KIT     . busPIRone (BUSPAR) 5 MG tablet Take 1 tablet (5 mg total) by mouth 2 (two) times daily as needed (anxiety). 60 tablet 5  . carvedilol (  COREG) 6.25 MG tablet Take 1 tablet (6.25 mg total) by mouth 2 (two) times daily. 180 tablet 3  . Cholecalciferol 1000 UNITS capsule Take 1 capsule (1,000 Units total) by mouth 2 (two) times daily.    . clotrimazole-betamethasone (LOTRISONE) cream Apply 1 application topically 2 (two) times daily. For 7 days maximum for severe jock itch . Stop if worsening symptoms. 45 g 1  . escitalopram (LEXAPRO) 5 MG tablet Take 1 tablet (5 mg total) by mouth daily. 90 tablet 5  . famotidine (PEPCID) 20 MG tablet TAKE 1 TABLET TWICE DAILY 180 tablet 1  . finasteride (PROSCAR) 5 MG tablet TAKE 1 TABLET EVERY DAY 90 tablet 1  . furosemide (LASIX) 20 MG tablet Take 1 tablet (20 mg total)  by mouth daily. 90 tablet 1  . gabapentin (NEURONTIN) 300 MG capsule Take 1 capsule (300 mg total) by mouth 2 (two) times daily. 180 capsule 3  . glucose blood (TRUE METRIX BLOOD GLUCOSE TEST) test strip TEST BLOOD SUGAR EVERY DAY 100 strip 1  . Multiple Vitamin (MULTIVITAMIN) capsule Take 1 capsule by mouth daily.      . mupirocin ointment (BACTROBAN) 2 % Apply to right foot ulcer once daily and cover with dressing. 30 g 1  . NONFORMULARY OR COMPOUNDED ITEM [facility]  Peripheral Neuropathy Cream- Bupivacaine 1%, Doxepin 3%, Gabapentin 6%, Pentoxifylline 3%, Topiramate 1% Apply 1-2 grams to affected area 3-4 times daily Qty. 120 gm 3 refills    . omeprazole (PRILOSEC) 20 MG capsule TAKE 1 CAPSULE DAILY 30 TO 60 MINUTES BEFORE FIRST MEAL OF THE DAY 90 capsule 1  . rosuvastatin (CRESTOR) 10 MG tablet Take 1 tablet (10 mg total) by mouth daily. 90 tablet 3  . sodium chloride (MURO 128) 5 % ophthalmic solution Place 1 drop into the left eye at bedtime.    . tamsulosin (FLOMAX) 0.4 MG CAPS capsule TAKE 1 CAPSULE EVERY DAY 90 capsule 1  . TRUEPLUS LANCETS 28G MISC Use to test blood sugars two times daily. Dx: E11.9 200 each 3  . sacubitril-valsartan (ENTRESTO) 24-26 MG Take 1 tablet by mouth 2 (two) times daily. 60 tablet 3  . metFORMIN (GLUCOPHAGE) 500 MG tablet TAKE 1 TABLET EVERY DAY WITH BREAKFAST (Patient taking differently: Take 500 mg by mouth every evening. ) 90 tablet 3   No facility-administered medications prior to visit.     Allergies:   Patient has no known allergies.   Social History   Socioeconomic History  . Marital status: Married    Spouse name: Not on file  . Number of children: Not on file  . Years of education: Not on file  . Highest education level: Not on file  Occupational History  . Occupation: retired    Comment: maintenence work  Tobacco Use  . Smoking status: Former Smoker    Packs/day: 4.00    Years: 20.00    Pack years: 80.00    Types:  Cigarettes    Quit date: 09/18/1984    Years since quitting: 35.4  . Smokeless tobacco: Never Used  . Tobacco comment: quit in 1980  Substance and Sexual Activity  . 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 Drug use: No  . Sexual activity: Yes  Other Topics Concern  . Not on file  Social History Narrative   Married 53 years in 2015. 4 kids (2 sons) and 3 grandkids.    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 Difficulty of Paying Living Expenses:   Food Insecurity:   . Worried About Charity fundraiser in the Last Year:   . Arboriculturist in the Last Year:   Transportation Needs:   . Film/video editor (Medical):   Marland Kitchen Lack of Transportation (Non-Medical):   Physical Activity:   . Days of Exercise per Week:   . Minutes of Exercise per Session:   Stress:   . Feeling of Stress :   Social Connections:   . Frequency of Communication with Friends and Family:   . Frequency of Social Gatherings with Friends and Family:   . Attends Religious Services:   . Active Member of Clubs or Organizations:   . Attends Archivist Meetings:   Marland Kitchen Marital Status:      Family History:  The patient's family history includes CAD in his brother; Melanoma in his mother; Stroke in his father.   Review of Systems:   Please see the history of present illness.     General:  No chills, fever, night sweats or weight changes.  Cardiovascular:  No chest pain, palpitations, paroxysmal nocturnal dyspnea. Positive for dyspnea on exertion and orthopnea.  Dermatological: No rash, lesions/masses Respiratory: No cough, dyspnea Urologic: No hematuria, dysuria Abdominal:   No nausea, vomiting, diarrhea, bright red blood per rectum, melena, or hematemesis Neurologic:  No visual changes, wkns, changes in mental  status. All other systems reviewed and are otherwise negative except as noted above.   Physical Exam:    VS:  BP 140/80   Pulse 88   Ht _0  (1.854 m)   Wt 206 lb (93.4 kg)   SpO2 94%   BMI 27.18 kg/m    General: Well developed, well nourished,male appearing in no acute distress. Head: Normocephalic, atraumatic, sclera non-icteric.  Neck: No carotid bruits. JVD not elevated.  Lungs: Respirations regular and unlabored, without wheezes or rales.  Heart: Regular rate and rhythm. No S3 or S4.  No rubs or gallops appreciated. 2/6 SEM along RUSB.  Abdomen: Soft, non-tender, non-distended. No obvious abdominal masses. Msk:  Strength and tone appear normal for age. No obvious joint deformities or effusions. Extremities: No clubbing or cyanosis. Trace ankle edema bilaterally.  Distal pedal pulses are 2+ bilaterally. Neuro: Alert and oriented X 3. Moves all extremities spontaneously. No focal deficits noted. Psych:  Responds to questions appropriately with a normal affect. Skin: No rashes or lesions noted  Wt Readings from Last 3 Encounters:  03/06/20 206 lb (93.4 kg)  02/25/20 204 lb 12.8 oz (92.9 kg)  01/28/20 200 lb (90.7 kg)     Studies/Labs Reviewed:   EKG:  EKG is not ordered today.   Recent Labs: 12/03/2019: B Natriuretic Peptide 927.6 12/04/2019: Magnesium 1.3; TSH 4.225 01/22/2020: ALT 10; Hemoglobin 10.8; Platelets 101.0 02/05/2020: BUN 17; Creatinine, Ser 1.07; Potassium 4.6; Sodium 141   Lipid Panel    Component Value Date/Time   CHOL 120 11/07/2019 1146   TRIG 126.0 11/07/2019 1146   HDL 45.60 11/07/2019 1146   CHOLHDL 3 11/07/2019 1146   VLDL 25.2 11/07/2019 1146   LDLCALC 49 11/07/2019 1146   LDLDIRECT 57.0 10/22/2018 1451    Additional studies/ records that were reviewed today include:   Echocardiogram: 11/2019  IMPRESSIONS    1. No LV thrombus seen on contrast imaging. Left ventricular ejection  fraction, by estimation, is 20 to 25%. The left ventricle  has severely  decreased function. The left ventricle demonstrates global hypokinesis.  The left ventricular internal cavity  size was mildly dilated. Indeterminate diastolic filling due to E-A  fusion.  2. Right ventricular systolic function is normal. The right ventricular  size is normal. Tricuspid regurgitation signal is inadequate for assessing  PA pressure.  3. The mitral valve is grossly normal. Trivial mitral valve  regurgitation. No evidence of mitral stenosis.  4. The aortic valve is grossly normal. Aortic valve regurgitation is not  visualized. Mild aortic valve stenosis.   Comparison(s): A prior study was performed on 02/05/2018. Prior images  reviewed side by side. Changes from prior study are noted. EF now 20-25%  with severe global hypokinesis.   Assessment:    1. Chronic systolic heart failure ([place])   2. Aortic valve stenosis, etiology of cardiac valve disease unspecified   3. Essential hypertension   4. Hyperlipidemia associated with type 2 diabetes mellitus (HCC)   5. Stage 3a chronic kidney disease   6. 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[place])      Plan:   In order of problems listed above:  1. Chronic Systolic CHF - he has a known reduced EF of 20-25% by echo in 11/2019. He continues to experience dyspnea on exertion and fatigue but this has overall been stable and has not progressed. Given his multiple cardiac risk factors (HTN, HLD, Type 2 DM, prior tobacco use, and family history of CAD with his brother having required CABG), I suspect he will need a cardiac catheterization in the near future but timing of this will depend on MRI results as outlined below.  - he is currently on Coreg 6.32m BID, Entresto 24-257mBID and Lasix 2049maily. Given improvement in renal function, will titrate Entresto to 49-52m17mD. Repeat BMET in 2 weeks. Pending results and reassessment of BP, would try to add Spironolactone.   2. Aortic Stenosis - mild by  echocardiogram in 11/2019. Continue to follow.   3. HTN - BP is slightly elevated at 140/80 during today's visit. Will continue Coreg 6.25mg52m with dose titration of Entresto as outlined above.   4. HLD - FLP in 10/2019 showed LDL at 49. He remains on Crestor 10mg 63my.   5. Stage 3 CKD - baseline creatinine 1.1 - 1.3. Creatinine was elevated to 1.52 in 01/2020, improved to 1.07 by most recent labs. Will obtain a repeat BMET in 2 weeks following titration of Entresto.   6. Alcoholic Cirrhosis/ Abnormal CT Scan - recent CT showed hepatic cirrhosis and he was found to have a 4 cm hypervascular mass in the left hepatic lobe and a nodule in the right posterior lobe which were suspicious for hepatocellular carcinomas. Scheduled for an abdominal MRI on 03/27/2020.   Medication Adjustments/Labs and Tests Ordered: Current medicines are reviewed at length with the patient today.  Concerns regarding medicines are outlined above.  Medication changes, Labs and Tests ordered today are listed in the Patient Instructions below. Patient Instructions  Medication Instructions:  Your physician has recommended you make the following change in your medication:  Increase Entresto to 49-51 mg two Times Daily   *If you need a refill on your cardiac medications before your next appointment, please call your pharmacy*   Lab Work: Your physician recommends that you return for lab work in: 2  Weeks   If you have labs (blood work) drawn today and your tests are completely normal, you will receive your results only by: Marland Kitchen MyChart Message (if you have MyChart) OR . A paper copy in the mail If you have any lab test that is abnormal or we need to change your treatment, we will call you to review the results.   Testing/Procedures: NONE   Follow-Up: At [facility], you and your health needs are our priority.  As part of our continuing mission to provide you with exceptional heart care, we have created  designated Provider Care Teams.  These Care Teams include your primary Cardiologist (physician) and Advanced Practice Providers (APPs -  Physician Assistants and Nurse Practitioners) who all work together to provide you with the care you need, when you need it.  We recommend signing up for the patient portal called "MyChart".  Sign up information is provided on this After Visit Summary.  MyChart is used to connect with patients for Virtual Visits (Telemedicine).  Patients are able to view lab/test results, encounter notes, upcoming appointments, etc.  Non-urgent messages can be sent to your provider as well.   To learn more about what you can do with MyChart, go to [email or web address].    Your next appointment:   4 week(s)  The format for your next appointment:   In Person  Provider:   Rozann Lesches, MD or Bernerd Pho, PA-C   Other Instructions Thank you for choosing [facility]!       Signed, Erma Heritage, PA-C  03/07/2020 8:39 AM    [place] [address] [place], [place] [address] Phone: [phone] Fax: [phone]

## 2020-03-06 NOTE — Patient Instructions (Signed)
Medication Instructions:  Your physician has recommended you make the following change in your medication:  Increase Entresto to 49-51 mg two Times Daily   *If you need a refill on your cardiac medications before your next appointment, please call your pharmacy*   Lab Work: Your physician recommends that you return for lab work in: 2 Weeks   If you have labs (blood work) drawn today and your tests are completely normal, you will receive your results only by: Marland Kitchen MyChart Message (if you have MyChart) OR . A paper copy in the mail If you have any lab test that is abnormal or we need to change your treatment, we will call you to review the results.   Testing/Procedures: NONE   Follow-Up: At [facility], you and your health needs are our priority.  As part of our continuing mission to provide you with exceptional heart care, we have created designated Provider Care Teams.  These Care Teams include your primary Cardiologist (physician) and Advanced Practice Providers (APPs -  Physician Assistants and Nurse Practitioners) who all work together to provide you with the care you need, when you need it.  We recommend signing up for the patient portal called "MyChart".  Sign up information is provided on this After Visit Summary.  MyChart is used to connect with patients for Virtual Visits (Telemedicine).  Patients are able to view lab/test results, encounter notes, upcoming appointments, etc.  Non-urgent messages can be sent to your provider as well.   To learn more about what you can do with MyChart, go to [email or web address].    Your next appointment:   4 week(s)  The format for your next appointment:   In Person  Provider:   Rozann Lesches, MD or Bernerd Pho, PA-C   Other Instructions Thank you for choosing [facility]!

## 2020-03-07 ENCOUNTER — Encounter: Payer: Self-pay | Admitting: Student

## 2020-03-09 NOTE — Progress Notes (Signed)
Subjective:   [name] presents for continued care of ulceration of right foot.  Patient has been performing daily dressing changes to right foot daily utilizing Mupirocin Ointment. Pt. denies any new complaints.  Patient denies any fever, chills, nightsweats, nausea or vomiting.  Current Outpatient Medications on File Prior to Visit  Medication Sig Dispense Refill  . acyclovir (ZOVIRAX) 400 MG tablet TAKE 1 TABLET TWICE DAILY 180 tablet 1  . albuterol (VENTOLIN HFA) 108 (90 Base) MCG/ACT inhaler Inhale 2 puffs into the lungs every 6 (six) hours as needed for wheezing or shortness of breath. 6.7 g 3  . ASCORBIC ACID PO Take 1 tablet by mouth daily.     Marland Kitchen aspirin EC 81 MG tablet Take 81 mg by mouth daily.    . Blood Glucose Monitoring Suppl (TRUE METRIX AIR GLUCOSE METER) w/Device KIT     . busPIRone (BUSPAR) 5 MG tablet Take 1 tablet (5 mg total) by mouth 2 (two) times daily as needed (anxiety). 60 tablet 5  . carvedilol (COREG) 6.25 MG tablet Take 1 tablet (6.25 mg total) by mouth 2 (two) times daily. 180 tablet 3  . Cholecalciferol 1000 UNITS capsule Take 1 capsule (1,000 Units total) by mouth 2 (two) times daily.    . clotrimazole-betamethasone (LOTRISONE) cream Apply 1 application topically 2 (two) times daily. For 7 days maximum for severe jock itch . Stop if worsening symptoms. 45 g 1  . escitalopram (LEXAPRO) 5 MG tablet Take 1 tablet (5 mg total) by mouth daily. 90 tablet 5  . furosemide (LASIX) 20 MG tablet Take 1 tablet (20 mg total) by mouth daily. 90 tablet 1  . gabapentin (NEURONTIN) 300 MG capsule Take 1 capsule (300 mg total) by mouth 2 (two) times daily. 180 capsule 3  . glucose blood (TRUE METRIX BLOOD GLUCOSE TEST) test strip TEST BLOOD SUGAR EVERY DAY 100 strip 1  . Multiple Vitamin (MULTIVITAMIN) capsule Take 1 capsule by mouth daily.      . mupirocin ointment (BACTROBAN) 2 % Apply to right foot ulcer once daily and cover with dressing. 30 g 1  . NONFORMULARY OR  COMPOUNDED ITEM [facility]  Peripheral Neuropathy Cream- Bupivacaine 1%, Doxepin 3%, Gabapentin 6%, Pentoxifylline 3%, Topiramate 1% Apply 1-2 grams to affected area 3-4 times daily Qty. 120 gm 3 refills    . rosuvastatin (CRESTOR) 10 MG tablet Take 1 tablet (10 mg total) by mouth daily. 90 tablet 3  . sodium chloride (MURO 128) 5 % ophthalmic solution Place 1 drop into the left eye at bedtime.    . TRUEPLUS LANCETS 28G MISC Use to test blood sugars two times daily. Dx: E11.9 200 each 3   No current facility-administered medications on file prior to visit.     No Known Allergies   Objective:   There were no vitals filed for this visit.   Vascular Examination: Neurovascular status unchanged b/l. Capillary refill time to digits immediate b/l. Palpable DP pulses b/l. Palpable PT pulses b/l. Pedal hair absent b/l Skin temperature gradient within normal limits b/l. No edema noted b/l.  Dermatological Examination: Pedal skin with normal turgor, texture and tone bilaterally. No open wounds bilaterally. No interdigital macerations bilaterally.  Wound Location: submet head 1 right foot There is minimal amount of non-viable hyperkeratotic tissue present in the wound. Predebridement Wound Measurement: 0.3 x 0.1 cm Postdebridement Wound Measurement: 0.5 x 0.1 cm and is healed. Wound Base: epithelialized Peri-wound: Normal Exudate: None: wound tissue dry Blood Loss  during debridement: less than 0 cc's. Description of tissue removed from ulceration today: non-viable hyperkeratosis. Signs of clinical bacterial infection are absent. Material in wound which inhibits healing/promotes adjacent tissue breakdown: non-viable hyperkeratosis.  Musculoskeletal Examination: Normal muscle strength 5/5 to all lower extremity muscle groups bilaterally. No pain crepitus or joint limitation noted with ROM b/l. Hammertoes noted to the 1-5 bilaterally.  Neurological Examination: Protective sensation  diminished with 10g monofilament b/l.  Assessment:   1.  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    NIDDM with peripheral neuropathy  Plan: 1. Ulcer was debrided to the level of healthy, viable tissue. Ulcer was cleansed with wound cleanser. Triple antibiotic ointment was applied to base of wound with light dressing. 2. Continue surgical shoe for an additional week and he may resume his diabetic shoe after that. He is to schedule appointment with [facility] for re-evaluation of insoles. 3. Patient was given written instructions on dressing changes and was instructed to call immediately if any signs or symptoms of infection arise.  4. Patient is to follow up in 3 weeks. 5. Patient instructed to report to emergency department with worsening appearance of ulcer/toe/foot, increased pain, foul odor, increased redness, swelling, drainage, fever, chills, nightsweats, nausea, vomiting, increased blood sugar.  6. Patient/POA related understanding.  Return in about 3 weeks (around 03/23/2020) for diabetic nail trim.

## 2020-03-17 ENCOUNTER — Other Ambulatory Visit (HOSPITAL_COMMUNITY)
Admission: RE | Admit: 2020-03-17 | Discharge: 2020-03-17 | Disposition: A | Discharge status: Home / Self Care | Payer: Medicare HMO | Source: Ambulatory Visit | Attending: Student | Admitting: Student

## 2020-03-17 ENCOUNTER — Other Ambulatory Visit: Payer: Self-pay

## 2020-03-17 DIAGNOSIS — I5022 Chronic systolic (congestive) heart failure: Secondary | ICD-10-CM | POA: Diagnosis not present

## 2020-03-17 LAB — BASIC METABOLIC PANEL
Anion gap: 10 (ref 5–15)
BUN: 14 mg/dL (ref 8–23)
CO2: 26 mmol/L (ref 22–32)
Calcium: 9 mg/dL (ref 8.9–10.3)
Chloride: 105 mmol/L (ref 98–111)
Creatinine, Ser: 0.93 mg/dL (ref 0.61–1.24)
GFR calc Af Amer: >60 mL/min (ref >60)
GFR calc non Af Amer: >60 mL/min (ref >60)
Glucose, Bld: 128 mg/dL — ABNORMAL HIGH (ref 70–99)
Potassium: 4.4 mmol/L (ref 3.5–5.1)
Sodium: 141 mmol/L (ref 135–145)

## 2020-03-18 ENCOUNTER — Telehealth: Payer: Self-pay | Admitting: *Deleted

## 2020-03-18 NOTE — Telephone Encounter (Signed)
-----   Message from Erma Heritage, [place] sent at 03/17/2020  2:51 PM EDT ----- Please let the patient know his electrolytes and renal function remain stable following dose adjustment of Entresto. Continue current medication regimen at this time.

## 2020-03-18 NOTE — Telephone Encounter (Signed)
Called pt no answer, no voicemail.  

## 2020-03-20 ENCOUNTER — Telehealth: Payer: Self-pay | Admitting: Family Medicine

## 2020-03-20 NOTE — Telephone Encounter (Signed)
[facility] is calling in stating they sent a medicare compliance form back in April then again in May but has not heard anything. Form number is [id number], verified fax number.

## 2020-03-23 ENCOUNTER — Ambulatory Visit: Payer: Medicare HMO | Admitting: Podiatry

## 2020-03-25 DIAGNOSIS — J9611 Chronic respiratory failure with hypoxia: Secondary | ICD-10-CM | POA: Diagnosis not present

## 2020-03-25 DIAGNOSIS — J449 Chronic obstructive pulmonary disease, unspecified: Secondary | ICD-10-CM | POA: Diagnosis not present

## 2020-03-26 ENCOUNTER — Ambulatory Visit: Payer: Medicare HMO | Admitting: Family Medicine

## 2020-03-27 ENCOUNTER — Other Ambulatory Visit: Payer: Self-pay

## 2020-03-27 ENCOUNTER — Encounter: Payer: Self-pay | Admitting: Podiatry

## 2020-03-27 ENCOUNTER — Ambulatory Visit: Payer: Medicare HMO | Admitting: Family Medicine

## 2020-03-27 ENCOUNTER — Ambulatory Visit
Admission: RE | Admit: 2020-03-27 | Discharge: 2020-03-27 | Disposition: A | Discharge status: Home / Self Care | Payer: Medicare HMO | Source: Ambulatory Visit | Attending: Family Medicine | Admitting: Family Medicine

## 2020-03-27 ENCOUNTER — Ambulatory Visit: Payer: Medicare HMO | Admitting: Podiatry

## 2020-03-27 DIAGNOSIS — L97501 Non-pressure chronic ulcer of other part of unspecified foot limited to breakdown of skin: Secondary | ICD-10-CM

## 2020-03-27 DIAGNOSIS — R1900 Intra-abdominal and pelvic swelling, mass and lump, unspecified site: Secondary | ICD-10-CM

## 2020-03-27 DIAGNOSIS — E1142 Type 2 diabetes mellitus with diabetic polyneuropathy: Secondary | ICD-10-CM | POA: Diagnosis not present

## 2020-03-27 DIAGNOSIS — E08621 Diabetes mellitus due to underlying condition with foot ulcer: Secondary | ICD-10-CM

## 2020-03-27 DIAGNOSIS — K8050 Calculus of bile duct without cholangitis or cholecystitis without obstruction: Secondary | ICD-10-CM | POA: Diagnosis not present

## 2020-03-27 MED ORDER — GADOBENATE DIMEGLUMINE 529 MG/ML IV SOLN
19.0000 mL | Freq: Once | INTRAVENOUS | Status: AC | PRN
  Administered 2020-03-27: 19 mL via INTRAVENOUS

## 2020-03-27 NOTE — Patient Instructions (Addendum)
DRESSING CHANGES RIGHT FOOT:  WEAR SURGICAL SHOE AT ALL TIMES!!!!!   1. KEEP RIGHT  FOOT DRY AT ALL TIMES!!!!  2. CLEANSE ULCER WITH SALINE.  3. DAB DRY WITH GAUZE SPONGE.  4. APPLY A LIGHT AMOUNT OF MUPIROCIN OINTMENT TO BASE OF ULCER.  5. APPLY OUTER BAND-AID AS INSTRUCTED.  6. WEAR SURGICAL SHOE DAILY AT ALL TIMES.  7. DO NOT WALK BAREFOOT!!!  8.  IF YOU EXPERIENCE ANY FEVER, CHILLS, NIGHTSWEATS, NAUSEA OR VOMITING, ELEVATED OR LOW BLOOD SUGARS, REPORT TO EMERGENCY ROOM.  9. IF YOU EXPERIENCE INCREASED REDNESS, PAIN, SWELLING, DISCOLORATION, ODOR, PUS, DRAINAGE OR WARMTH OF YOUR FOOT, REPORT TO EMERGENCY ROOM.  Diabetes Mellitus and Foot Care Foot care is an important part of your health, especially when you have diabetes. Diabetes may cause you to have problems because of poor blood flow (circulation) to your feet and legs, which can cause your skin to:  Become thinner and drier.  Break more easily.  Heal more slowly.  Peel and crack. You may also have nerve damage (neuropathy) in your legs and feet, causing decreased feeling in them. This means that you may not notice minor injuries to your feet that could lead to more serious problems. Noticing and addressing any potential problems early is the best way to prevent future foot problems. How to care for your feet Foot hygiene  Wash your feet daily with warm water and mild soap. Do not use hot water. Then, pat your feet and the areas between your toes until they are completely dry. Do not soak your feet as this can dry your skin.  Trim your toenails straight across. Do not dig under them or around the cuticle. File the edges of your nails with an emery board or nail file.  Apply a moisturizing lotion or petroleum jelly to the skin on your feet and to dry, brittle toenails. Use lotion that does not contain alcohol and is unscented. Do not apply lotion between your toes. Shoes and socks  Wear clean socks or  stockings every day. Make sure they are not too tight. Do not wear knee-high stockings since they may decrease blood flow to your legs.  Wear shoes that fit properly and have enough cushioning. Always look in your shoes before you put them on to be sure there are no objects inside.  To break in new shoes, wear them for just a few hours a day. This prevents injuries on your feet. Wounds, scrapes, corns, and calluses  Check your feet daily for blisters, cuts, bruises, sores, and redness. If you cannot see the bottom of your feet, use a mirror or ask someone for help.  Do not cut corns or calluses or try to remove them with medicine.  If you find a minor scrape, cut, or break in the skin on your feet, keep it and the skin around it clean and dry. You may clean these areas with mild soap and water. Do not clean the area with peroxide, alcohol, or iodine.  If you have a wound, scrape, corn, or callus on your foot, look at it several times a day to make sure it is healing and not infected. Check for: ? Redness, swelling, or pain. ? Fluid or blood. ? Warmth. ? Pus or a bad smell. General instructions  Do not cross your legs. This may decrease blood flow to your feet.  Do not use heating pads or hot water bottles on your feet. They may burn your skin. If  you have lost feeling in your feet or legs, you may not know this is happening until it is too late.  Protect your feet from hot and cold by wearing shoes, such as at the beach or on hot pavement.  Schedule a complete foot exam at least once a year (annually) or more often if you have foot problems. If you have foot problems, report any cuts, sores, or bruises to your health care provider immediately. Contact a health care provider if:  You have a medical condition that increases your risk of infection and you have any cuts, sores, or bruises on your feet.  You have an injury that is not healing.  You have redness on your legs or  feet.  You feel burning or tingling in your legs or feet.  You have pain or cramps in your legs and feet.  Your legs or feet are numb.  Your feet always feel cold.  You have pain around a toenail. Get help right away if:  You have a wound, scrape, corn, or callus on your foot and: ? You have pain, swelling, or redness that gets worse. ? You have fluid or blood coming from the wound, scrape, corn, or callus. ? Your wound, scrape, corn, or callus feels warm to the touch. ? You have pus or a bad smell coming from the wound, scrape, corn, or callus. ? You have a fever. ? You have a red line going up your leg. Summary  Check your feet every day for cuts, sores, red spots, swelling, and blisters.  Moisturize feet and legs daily.  Wear shoes that fit properly and have enough cushioning.  If you have foot problems, report any cuts, sores, or bruises to your health care provider immediately.  Schedule a complete foot exam at least once a year (annually) or more often if you have foot problems. This information is not intended to replace advice given to you by your health care provider. Make sure you discuss any questions you have with your health care provider. Document Revised: 06/19/2019 Document Reviewed: 10/28/2016 Elsevier Patient Education  [place].

## 2020-03-27 NOTE — Progress Notes (Deleted)
Phone [phone] In person visit   Subjective:   [name] is a 79 y.o. year old very pleasant male patient who presents for/with See problem oriented charting No chief complaint on file.   This visit occurred during the SARS-CoV-2 public health emergency.  Safety protocols were in place, including screening questions prior to the visit, additional usage of staff PPE, and extensive cleaning of exam room while observing appropriate contact time as indicated for disinfecting solutions.   Past Medical History-  Patient Active Problem List   Diagnosis Date Noted  . Anxiety 02/25/2020  . 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[place]) 12/14/2019  . Aortic atherosclerosis ([place]) 12/14/2019  . Chronic systolic heart failure ([place]) 12/05/2019  . Diabetic ulcer of right midfoot associated with diabetes mellitus due to underlying condition, with fat layer exposed ([place]) 11/07/2019  . Anemia 03/05/2018  . GERD (gastroesophageal reflux disease) 09/30/2017  . 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 positive 03/29/2016  . Thrombocytopenia ([place]) 12/21/2015  . B12 deficiency 08/20/2015  . 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 Diabetic polyneuropathy associated with type 2 diabetes mellitus ([place]) 08/07/2015  . Diplopia 06/05/2015  . CKD (chronic kidney disease), stage III 05/06/2015  . Fatty liver 11/04/2014  . 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 PCO (posterior capsular opacification) 06/19/2013  . 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 Pseudophakia of left eye 04/10/2013  . ILD (interstitial lung disease) ([place]) 07/05/2012  . Nuclear cataract 01/20/2012  . 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 COPD (chronic obstructive pulmonary disease) ([place]) 12/15/2009  . BPH (benign prostatic hyperplasia) 06/20/2007  . Depression 05/02/2007  . Chronic back pain. Off narcotics 06/05/17 due to negative UDS for opiates x2. Full history 06/12/14. Pain contract signed.  05/02/2007  . DM (diabetes mellitus) type II  controlled, neurological manifestation ([place]) 04/06/2007  . Hyperlipidemia associated with type 2 diabetes mellitus ([place]) 04/06/2007  . Essential hypertension 04/06/2007  . Osteoarthritis 04/06/2007    Medications- reviewed and updated Current Outpatient Medications  Medication Sig Dispense Refill  . acyclovir (ZOVIRAX) 400 MG tablet TAKE 1 TABLET TWICE DAILY 180 tablet 1  . albuterol (VENTOLIN HFA) 108 (90 Base) MCG/ACT inhaler Inhale 2 puffs into the lungs every 6 (six) hours as needed for wheezing or shortness of breath. 6.7 g 3  . amitriptyline (ELAVIL) 50 MG tablet TAKE 1 TABLET AT BEDTIME 90 tablet 1  . ASCORBIC ACID PO Take 1 tablet by mouth daily.     Marland Kitchen aspirin EC 81 MG tablet Take 81 mg by mouth daily.    . Blood Glucose Monitoring Suppl (TRUE METRIX AIR GLUCOSE METER) w/Device KIT     . busPIRone (BUSPAR) 5 MG tablet Take 1 tablet (5 mg total) by mouth 2 (two) times daily as needed (anxiety). 60 tablet 5  . carvedilol (COREG) 6.25 MG tablet Take 1 tablet (6.25 mg total) by mouth 2 (two) times daily. 180 tablet 3  . Cholecalciferol 1000 UNITS capsule Take 1 capsule (1,000 Units total) by mouth 2 (two) times daily.    . clotrimazole-betamethasone (LOTRISONE) cream Apply 1 application topically 2 (two) times daily. For 7 days maximum for severe jock itch . Stop if worsening symptoms. 45 g 1  . escitalopram (LEXAPRO) 5 MG tablet Take 1 tablet (5 mg total) by mouth daily. 90 tablet 5  . famotidine (PEPCID) 20 MG tablet TAKE 1 TABLET TWICE DAILY 180 tablet 1  . finasteride (PROSCAR) 5 MG tablet TAKE 1 TABLET EVERY DAY 90 tablet 1  . furosemide (LASIX)  20 MG tablet Take 1 tablet (20 mg total) by mouth daily. 90 tablet 1  . gabapentin (NEURONTIN) 300 MG capsule Take 1 capsule (300 mg total) by mouth 2 (two) times daily. 180 capsule 3  . glucose blood (TRUE METRIX BLOOD GLUCOSE TEST) test strip TEST BLOOD SUGAR EVERY DAY 100 strip 1  . Multiple Vitamin (MULTIVITAMIN) capsule Take 1 capsule  by mouth daily.      . mupirocin ointment (BACTROBAN) 2 % Apply to right foot ulcer once daily and cover with dressing. 30 g 1  . NONFORMULARY OR COMPOUNDED ITEM [facility]  Peripheral Neuropathy Cream- Bupivacaine 1%, Doxepin 3%, Gabapentin 6%, Pentoxifylline 3%, Topiramate 1% Apply 1-2 grams to affected area 3-4 times daily Qty. 120 gm 3 refills    . omeprazole (PRILOSEC) 20 MG capsule TAKE 1 CAPSULE DAILY 30 TO 60 MINUTES BEFORE FIRST MEAL OF THE DAY 90 capsule 1  . rosuvastatin (CRESTOR) 10 MG tablet Take 1 tablet (10 mg total) by mouth daily. 90 tablet 3  . sacubitril-valsartan (ENTRESTO) 49-51 MG Take 1 tablet by mouth 2 (two) times daily. 180 tablet 3  . sodium chloride (MURO 128) 5 % ophthalmic solution Place 1 drop into the left eye at bedtime.    . tamsulosin (FLOMAX) 0.4 MG CAPS capsule TAKE 1 CAPSULE EVERY DAY 90 capsule 1  . TRUEPLUS LANCETS 28G MISC Use to test blood sugars two times daily. Dx: E11.9 200 each 3   No current facility-administered medications for this visit.     Objective:  There were no vitals taken for this visit. Gen: NAD, resting comfortably CV: RRR no murmurs rubs or gallops Lungs: CTAB no crackles, wheeze, rhonchi Abdomen: soft/nontender/nondistended/normal bowel sounds. No rebound or guarding.  Ext: no edema Skin: warm, dry Neuro: grossly normal, moves all extremities  ***    Assessment and Plan  *** ***Macrocytosis work-up- declines oncology 02/12/20 ***Monitor renal function each visit ***Liver ultrasound every 6 months starting September 2021 *** Lobulated renal parenchyma versus less likely left renal mass. Further evaluation with ultrasound or CT with IV contrast on a nonemergent/outpatient basis recommended.  #ILD/COPD S: Patient carries a diagnosis of COPD due to former smoking.  Also previously diagnosed as having interstitial lung disease by pulmonology.  He does not follow-up with them anymore.  Fortunately breathing issues  have not progressed.  He has albuterol but uses it 1-3x a day***- stable at present A/P:***   # Diabetes S: compliant with metformin 1028m in AM and 5043min the evening CBGs- *** Exercise and diet- ***  A/P: ***  #/Congestive heart failure with EF 20 to 25%/hypertension/CKD stage III S: Post heart failure hospitalization patient compliant with carvedilol, Lasix 20 mg, Entresto started by cardiology. Prior to heart failure:  amlodipine 1079mlisinopril 2.5mg77moday, ***  Chronic kidney disease has been stable with GFR in the 50s-several readings in 2019 and 2020 with GFR at 60 or above ***  A/P: ***  #hyperlipidemia S: compliant with rosuvastatin 10mg47mL goal under 70 A/P: ***  # GERD S:compliant with omeprazole 20mg 32m: ***   # BPH S:compliant with flomax 0.4mg an70minasteride 5mg.  R65m nocturia ***.  A/P: ***   # history of ocular herpes S:patient is compliant with acyclovir 400mg BID57ms been on this since 1980- apparently has had flare ups in past when tried to come off. Was in his right eye.  A/P: ***   #Chronic low back pain/diabetic neuropathy S: Used to be on  narcotics but UDS's were negative for narcotics x2 and these were discontinued.  Patient is compliant with amitriptyline and gabapentin-he finds these helpful. A/P: ***    #Thrombocytopenia S: Follows with oncology- see extensive note December 20, 2018 ***  -They continue to follow blood work A/P: ***   # Depression Well controlled with Lexapro 5 mg started in 2021 -patient denies any sleep issues which was primary reason he was using amitriptyline Patient stopped taking Prozac in 2020 or 2021.  Previously been on amitriptyline 50 mg but we titrated off due to fall risk/age/potential memory concerns  A/P:  # Jock itch - can get bad and uses clotrimazone and betamethasone together and finds helpful- asks for refill today. ***  No problem-specific Assessment & Plan notes found for this  encounter.   Recommended follow up: ***No follow-ups on file. Future Appointments  Date Time Provider [place]  03/27/2020 12:00 PM Marzetta Board, DPM [facility]  03/27/2020  1:00 PM [name], MD [facility]  03/27/2020  4:40 PM [facility] MR 1 [id number] [address] [facility]  04/03/2020  3:30 PM Strader, Fransisco Hertz, PA-C [facility] [place] [facility]    Lab/Order associations: No diagnosis found.  No orders of the defined types were placed in this encounter.   Time Spent: *** minutes of total time (11:33 AM***- 11:33 AM***) was spent on the date of the encounter performing the following actions: chart review prior to seeing the patient, obtaining history, performing a medically necessary exam, counseling on the treatment plan, placing orders, and documenting in our EHR.   Return precautions advised.  Francella Solian, CMA

## 2020-04-01 NOTE — Telephone Encounter (Signed)
[name] from [facility] is calling in, stating they have not heard from anyone about this outstanding order. They are needing it for medicaid compliance.

## 2020-04-01 NOTE — Telephone Encounter (Signed)
Called spoke to [place] she will refax to pod fax now

## 2020-04-02 ENCOUNTER — Other Ambulatory Visit: Payer: Self-pay

## 2020-04-02 DIAGNOSIS — R160 Hepatomegaly, not elsewhere classified: Secondary | ICD-10-CM

## 2020-04-02 DIAGNOSIS — C220 Liver cell carcinoma: Secondary | ICD-10-CM

## 2020-04-03 ENCOUNTER — Encounter: Payer: Self-pay | Admitting: Student

## 2020-04-03 ENCOUNTER — Encounter: Payer: Self-pay | Admitting: Podiatry

## 2020-04-03 ENCOUNTER — Ambulatory Visit: Payer: Medicare HMO | Admitting: Podiatry

## 2020-04-03 ENCOUNTER — Ambulatory Visit (INDEPENDENT_AMBULATORY_CARE_PROVIDER_SITE_OTHER): Payer: Medicare HMO | Admitting: Student

## 2020-04-03 ENCOUNTER — Other Ambulatory Visit: Payer: Self-pay

## 2020-04-03 ENCOUNTER — Other Ambulatory Visit: Payer: Medicare HMO | Admitting: Orthotics

## 2020-04-03 VITALS — BP 128/76 | HR 86 | Ht 71.0 in | Wt 205.6 lb

## 2020-04-03 DIAGNOSIS — I5022 Chronic systolic (congestive) heart failure: Secondary | ICD-10-CM

## 2020-04-03 DIAGNOSIS — E785 Hyperlipidemia, unspecified: Secondary | ICD-10-CM | POA: Diagnosis not present

## 2020-04-03 DIAGNOSIS — N1831 Chronic kidney disease, stage 3a: Secondary | ICD-10-CM | POA: Diagnosis not present

## 2020-04-03 DIAGNOSIS — L97501 Non-pressure chronic ulcer of other part of unspecified foot limited to breakdown of skin: Secondary | ICD-10-CM

## 2020-04-03 DIAGNOSIS — E1142 Type 2 diabetes mellitus with diabetic polyneuropathy: Secondary | ICD-10-CM

## 2020-04-03 DIAGNOSIS — I10 Essential (primary) hypertension: Secondary | ICD-10-CM | POA: Diagnosis not present

## 2020-04-03 DIAGNOSIS — E08621 Diabetes mellitus due to underlying condition with foot ulcer: Secondary | ICD-10-CM

## 2020-04-03 DIAGNOSIS — C220 Liver cell carcinoma: Secondary | ICD-10-CM | POA: Diagnosis not present

## 2020-04-03 DIAGNOSIS — L97511 Non-pressure chronic ulcer of other part of right foot limited to breakdown of skin: Secondary | ICD-10-CM

## 2020-04-03 MED ORDER — SACUBITRIL-VALSARTAN 49-51 MG PO TABS: 1.0000 | ORAL_TABLET | Freq: Two times a day (BID) | ORAL | 3 refills | Status: DC

## 2020-04-03 NOTE — Progress Notes (Signed)
Cardiology Office Note    Date:  04/04/2020   ID:  Deonte, Otting 1940/11/02, MRN [id number]  PCP:  [name], MD  Cardiologist: Rozann Lesches, MD    Chief Complaint  Patient presents with  . Follow-up    1 month visit    History of Present Illness:    [name] is a 79 y.o. male with past medical history of chronic systolic CHF (EF 89-37% by echo in 11/2019), HTN, HLD, Type 2 DM, Stage 3 CKD, liver cirrhosis, thrombocytopenia and COPD who presents to the office today for 1 month follow-up.  He was last examined by myself in 02/2020 reported still having dyspnea on exertion with minimal activity along with orthopnea. He denied any exertional chest pain. He had recently undergone CT imaging of his abdomen which had shown a 4 cm hypervascular mass in the left hepatic lobe and nodule in the right posterior lobe which was suspicious for hepatocellular carcinomas and abdominal MRI was recommended. He was continued on his current medication regimen including Coreg 6.25 mg twice daily and Lasix 20 mg daily with Entresto being titrated from 24-62m BID to 49-55mBID. It was felt he would likely require a cardiac catheterization in the near future but this was delayed until his MRI had resulted.  His Abdominal MRI was performed on 03/27/2020 and showed a dominant lesion of concern in the medial segment of the left hepatic lobe and diagnostic of hepatocellular carcinoma. By review of notes, he was urgently referred to General Surgery and Oncology.  In talking with the patient today, he reports having scheduled follow-up his PCP next week to discuss the next step in further evaluation of his recently diagnosed liver cancer. He continues to have dyspnea on exertion but this has overall been stable. No associated orthopnea, PND or edema. No chest pain or palpitations.   He reports good compliance with his current medication regimen. [employer] did not send the updated Rx for his  EnDelene Lollo he has been doubling-up on his 24-2634mablets and will soon run out.   Past Medical History:  Diagnosis Date  . Benign prostatic hypertrophy   . COPD (chronic obstructive pulmonary disease) [place] [facility]   FEV1 2.30 (70%) ratio 63 no better with B2 and DLCO 18/6 (73%) corrects to 106%  . Degenerative joint disease   . Depression   . Diverticulosis   . Essential hypertension    ACE inhibitor cough  . Fatty liver   . Hyperlipidemia   . Internal hemorrhoids   . Low back pain   . Otitis externa 07/30/2012  . Splenomegaly   . Type 2 diabetes mellitus ([place] . Ulcer of foot ([place]  Right foot    Past Surgical History:  Procedure Laterality Date  . NO PAST SURGERIES  1980    Current Medications: Outpatient Medications Prior to Visit  Medication Sig Dispense Refill  . acyclovir (ZOVIRAX) 400 MG tablet TAKE 1 TABLET TWICE DAILY 180 tablet 1  . albuterol (VENTOLIN HFA) 108 (90 Base) MCG/ACT inhaler Inhale 2 puffs into the lungs every 6 (six) hours as needed for wheezing or shortness of breath. 6.7 g 3  . amitriptyline (ELAVIL) 50 MG tablet TAKE 1 TABLET AT BEDTIME 90 tablet 1  . ASCORBIC ACID PO Take 1 tablet by mouth daily.     . aMarland Kitchenpirin EC 81 MG tablet Take 81 mg by mouth daily.    . Blood Glucose Monitoring Suppl (  TRUE METRIX AIR GLUCOSE METER) w/Device KIT     . busPIRone (BUSPAR) 5 MG tablet Take 1 tablet (5 mg total) by mouth 2 (two) times daily as needed (anxiety). 60 tablet 5  . carvedilol (COREG) 6.25 MG tablet Take 1 tablet (6.25 mg total) by mouth 2 (two) times daily. 180 tablet 3  . Cholecalciferol 1000 UNITS capsule Take 1 capsule (1,000 Units total) by mouth 2 (two) times daily.    . clotrimazole-betamethasone (LOTRISONE) cream Apply 1 application topically 2 (two) times daily. For 7 days maximum for severe jock itch . Stop if worsening symptoms. 45 g 1  . escitalopram (LEXAPRO) 5 MG tablet Take 1 tablet (5 mg total) by mouth daily. 90 tablet 5  .  famotidine (PEPCID) 20 MG tablet TAKE 1 TABLET TWICE DAILY 180 tablet 1  . finasteride (PROSCAR) 5 MG tablet TAKE 1 TABLET EVERY DAY 90 tablet 1  . gabapentin (NEURONTIN) 300 MG capsule Take 1 capsule (300 mg total) by mouth 2 (two) times daily. 180 capsule 3  . glucose blood (TRUE METRIX BLOOD GLUCOSE TEST) test strip TEST BLOOD SUGAR EVERY DAY 100 strip 1  . Multiple Vitamin (MULTIVITAMIN) capsule Take 1 capsule by mouth daily.      . mupirocin ointment (BACTROBAN) 2 % Apply to right foot ulcer once daily and cover with dressing. 30 g 1  . NONFORMULARY OR COMPOUNDED ITEM [facility]  Peripheral Neuropathy Cream- Bupivacaine 1%, Doxepin 3%, Gabapentin 6%, Pentoxifylline 3%, Topiramate 1% Apply 1-2 grams to affected area 3-4 times daily Qty. 120 gm 3 refills    . omeprazole (PRILOSEC) 20 MG capsule TAKE 1 CAPSULE DAILY 30 TO 60 MINUTES BEFORE FIRST MEAL OF THE DAY 90 capsule 1  . rosuvastatin (CRESTOR) 10 MG tablet Take 1 tablet (10 mg total) by mouth daily. 90 tablet 3  . sodium chloride (MURO 128) 5 % ophthalmic solution Place 1 drop into the left eye at bedtime.    . tamsulosin (FLOMAX) 0.4 MG CAPS capsule TAKE 1 CAPSULE EVERY DAY 90 capsule 1  . TRUEPLUS LANCETS 28G MISC Use to test blood sugars two times daily. Dx: E11.9 200 each 3  . sacubitril-valsartan (ENTRESTO) 49-51 MG Take 1 tablet by mouth 2 (two) times daily. 180 tablet 3  . furosemide (LASIX) 20 MG tablet Take 1 tablet (20 mg total) by mouth daily. 90 tablet 1   No facility-administered medications prior to visit.     Allergies:   Patient has no known allergies.   Social History   Socioeconomic History  . Marital status: Married    Spouse name: Not on file  . Number of children: Not on file  . Years of education: Not on file  . Highest education level: Not on file  Occupational History  . Occupation: retired    Comment: maintenence work  Tobacco Use  . Smoking status: Former Smoker    Packs/day: 4.00     Years: 20.00    Pack years: 80.00    Types: Cigarettes    Quit date: 09/18/1984    Years since quitting: 35.5  . Smokeless tobacco: Never Used  . Tobacco comment: quit in 1980  Vaping Use  . Vaping Use: Never used  Substance and Sexual Activity  . 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 Drug use: No  . Sexual activity: Yes  Other Topics Concern  . Not on file  Social History Narrative   Married 53 years  in 2015. 4 kids (2 sons) and 3 grandkids.    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 Difficulty of Paying Living Expenses:   Food Insecurity:   . Worried About Charity fundraiser in the Last Year:   . Arboriculturist in the Last Year:   Transportation Needs:   . Film/video editor (Medical):   Marland Kitchen Lack of Transportation (Non-Medical):   Physical Activity:   . Days of Exercise per Week:   . Minutes of Exercise per Session:   Stress:   . Feeling of Stress :   Social Connections:   . Frequency of Communication with Friends and Family:   . Frequency of Social Gatherings with Friends and Family:   . Attends Religious Services:   . Active Member of Clubs or Organizations:   . Attends Archivist Meetings:   Marland Kitchen Marital Status:      Family History:  The patient's family history includes CAD in his brother; Melanoma in his mother; Stroke in his father.   Review of Systems:   Please see the history of present illness.     General:  No chills, fever, night sweats or weight changes.  Cardiovascular:  No chest pain, edema, orthopnea, palpitations, paroxysmal nocturnal dyspnea. Positive for dyspnea on exertion.  Dermatological: No rash, lesions/masses Respiratory: No cough, dyspnea Urologic: No hematuria, dysuria Abdominal:   No nausea, vomiting, diarrhea, bright red blood per  rectum, melena, or hematemesis Neurologic:  No visual changes, wkns, changes in mental status. All other systems reviewed and are otherwise negative except as noted above.   Physical Exam:    VS:  BP 128/76   Pulse 86   Ht '5\' 11"'  (1.803 m)   Wt 205 lb 9.6 oz (93.3 kg)   SpO2 95%   BMI 28.68 kg/m    General: Well developed, well nourished,male appearing in no acute distress. Head: Normocephalic, atraumatic, sclera non-icteric.  Neck: No carotid bruits. JVD not elevated.  Lungs: Respirations regular and unlabored, without wheezes or rales.  Heart: Regular rate and rhythm. No S3 or S4.  No rubs or gallops appreciated. 2/6 SEM along RUSB.  Abdomen: Soft, non-tender, non-distended. No obvious abdominal masses. Msk:  Strength and tone appear normal for age. No obvious joint deformities or effusions. Extremities: No clubbing or cyanosis. No lower extremity edema.  Distal pedal pulses are 2+ bilaterally. Right foot in boot.  Neuro: Alert and oriented X 3. Moves all extremities spontaneously. No focal deficits noted. Psych:  Responds to questions appropriately with a normal affect. Skin: No rashes or lesions noted  Wt Readings from Last 3 Encounters:  04/03/20 205 lb 9.6 oz (93.3 kg)  03/06/20 206 lb (93.4 kg)  02/25/20 204 lb 12.8 oz (92.9 kg)     Studies/Labs Reviewed:   EKG:  EKG is not ordered today.   Recent Labs: 12/03/2019: B Natriuretic Peptide 927.6 12/04/2019: Magnesium 1.3; TSH 4.225 01/22/2020: ALT 10; Hemoglobin 10.8; Platelets 101.0 03/17/2020: BUN 14; Creatinine, Ser 0.93; Potassium 4.4; Sodium 141   Lipid Panel    Component Value Date/Time   CHOL 120 11/07/2019 1146   TRIG 126.0 11/07/2019 1146   HDL 45.60 11/07/2019 1146   CHOLHDL 3 11/07/2019 1146  VLDL 25.2 11/07/2019 1146   LDLCALC 49 11/07/2019 1146   LDLDIRECT 57.0 10/22/2018 1451    Additional studies/ records that were reviewed today include:   Echocardiogram: 11/2019 IMPRESSIONS    1. No LV  thrombus seen on contrast imaging. Left ventricular ejection  fraction, by estimation, is 20 to 25%. The left ventricle has severely  decreased function. The left ventricle demonstrates global hypokinesis.  The left ventricular internal cavity  size was mildly dilated. Indeterminate diastolic filling due to E-A  fusion.  2. Right ventricular systolic function is normal. The right ventricular  size is normal. Tricuspid regurgitation signal is inadequate for assessing  PA pressure.  3. The mitral valve is grossly normal. Trivial mitral valve  regurgitation. No evidence of mitral stenosis.  4. The aortic valve is grossly normal. Aortic valve regurgitation is not  visualized. Mild aortic valve stenosis.   Abdominal MRI: 03/2020 IMPRESSION: 1. The dominant lesion of concern in the medial segment of the left hepatic lobe (segment 4 A) is diagnostic of hepatocellular carcinoma (LR 5). 2. The other small lesion questioned inferiorly in the right lobe is not clearly seen, although evaluation of this area is mildly limited by motion artifact. 3. Small amount of ascites without definite evidence of metastatic disease. 4. Small dependent left pleural effusion and small hernia. 5. Mild extrahepatic biliary dilatation post cholecystectomy. No evidence of choledocholithiasis.  Assessment:    1. Chronic systolic heart failure ([place])   2. Essential hypertension   3. Hyperlipidemia LDL goal <70   4. Stage 3a chronic kidney disease   5. Hepatocellular carcinoma ([place])      Plan:   In order of problems listed above:  1. Chronic Systolic CHF - Echocardiogram earlier this year showed a reduced EF of 20-25%. He continues to have dyspnea on exertion but denies any chest pain or palpitations. Appears euvolemic by examination.  - We had previously discussed a cardiac catheterization but given his recently diagnosed liver cancer, would not pursue aggressive treatment at this time as that could led  to him being on uninterrupted DAPT for 6-12 months. Reviewed with Dr. Domenic Polite as well who was in agreement for continued medical therapy at this time.  - BP is at 128/76 today but he reports it has been "low" at times. He is currently on Coreg 6.20m BID, Lasix 294mdaily and Entresto 49-5185mID. Samples were provided of Entresto today. I have asked him to keep a BP log and report back on readings. If BP allows, will titrate Coreg to 9.375m36mD or add Spironolactone 12.5mg 64mly.   2. HTN - BP is well-controlled at 128/76 during today's visit. I have asked him to keep a BP log at home and report back with readings in several weeks as we can further titrate Coreg or add Spironolactone at that time if BP allows.   3. HLD - Followed by PCP. He remains on Crestor 10mg 58my.   4. Stage 3 CKD - Baseline creatinine 1.1 - 1.2. Improved to 0.93 by recent labs on 03/17/2020.  5. Liver Cancer -  Recent MRI showed a dominant lesion of concern in the medial segment of the left hepatic lobe and diagnostic of hepatocellular carcinoma. He has been referred to General Surgery and Oncology by his PCP. He reports at this time, he would not wish to pursue chemotherapy but will review options further with Oncology.    Medication Adjustments/Labs and Tests Ordered: Current medicines are reviewed at length with the patient today.  Concerns regarding medicines are outlined above.  Medication changes, Labs and Tests ordered today are listed in the Patient Instructions below. Patient Instructions  Medication Instructions:  Your physician recommends that you continue on your current medications as directed. Please refer to the Current Medication list given to you today.  *If you need a refill on your cardiac medications before your next appointment, please call your pharmacy*   Lab Work: NONE   If you have labs (blood work) drawn today and your tests are completely normal, you will receive your results only  by: Marland Kitchen MyChart Message (if you have MyChart) OR . A paper copy in the mail If you have any lab test that is abnormal or we need to change your treatment, we will call you to review the results.   Testing/Procedures: NONE   Follow-Up: At [facility], you and your health needs are our priority.  As part of our continuing mission to provide you with exceptional heart care, we have created designated Provider Care Teams.  These Care Teams include your primary Cardiologist (physician) and Advanced Practice Providers (APPs -  Physician Assistants and Nurse Practitioners) who all work together to provide you with the care you need, when you need it.  We recommend signing up for the patient portal called "MyChart".  Sign up information is provided on this After Visit Summary.  MyChart is used to connect with patients for Virtual Visits (Telemedicine).  Patients are able to view lab/test results, encounter notes, upcoming appointments, etc.  Non-urgent messages can be sent to your provider as well.   To learn more about what you can do with MyChart, go to [email or web address].    Your next appointment:   2-3 month(s)  The format for your next appointment:   In Person  Provider:   Rozann Lesches, MD or Bernerd Pho, PA-C   Other Instructions Thank you for choosing [facility]!     Signed, Erma Heritage, PA-C  04/04/2020 9:41 AM    [place] [address] [place], [place] [address] Phone: [phone] Fax: [phone]

## 2020-04-03 NOTE — Patient Instructions (Addendum)
Medication Instructions:  Your physician recommends that you continue on your current medications as directed. Please refer to the Current Medication list given to you today.  *If you need a refill on your cardiac medications before your next appointment, please call your pharmacy*   Lab Work: NONE   If you have labs (blood work) drawn today and your tests are completely normal, you will receive your results only by: Marland Kitchen MyChart Message (if you have MyChart) OR . A paper copy in the mail If you have any lab test that is abnormal or we need to change your treatment, we will call you to review the results.   Testing/Procedures: NONE   Follow-Up: At [facility], you and your health needs are our priority.  As part of our continuing mission to provide you with exceptional heart care, we have created designated Provider Care Teams.  These Care Teams include your primary Cardiologist (physician) and Advanced Practice Providers (APPs -  Physician Assistants and Nurse Practitioners) who all work together to provide you with the care you need, when you need it.  We recommend signing up for the patient portal called "MyChart".  Sign up information is provided on this After Visit Summary.  MyChart is used to connect with patients for Virtual Visits (Telemedicine).  Patients are able to view lab/test results, encounter notes, upcoming appointments, etc.  Non-urgent messages can be sent to your provider as well.   To learn more about what you can do with MyChart, go to [email or web address].    Your next appointment:   2-3 month(s)  The format for your next appointment:   In Person  Provider:   Rozann Lesches, MD or Bernerd Pho, PA-C   Other Instructions Thank you for choosing [facility]!

## 2020-04-03 NOTE — Telephone Encounter (Signed)
Done

## 2020-04-04 ENCOUNTER — Encounter: Payer: Self-pay | Admitting: Student

## 2020-04-04 NOTE — Progress Notes (Signed)
Subjective:  Patient ID: [name], male    DOB: [birth year],  MRN: [id number]  79 y.o. male is seen for follow up ulcer submet head 1 right foot.  Prescribed wound care regimen: Mupirocin Ointment dressing once daily.  Patient also has Darco shoe which he has been wearing daily.  Patient relates wound is worse as he sees more dried blood on the bottom of his foot when compared to previous visit.  Patient denies any fever, chills, night sweats, nausea, or vomiting.  He states one of his daughters mentioned hyperbaric oxygen treatment for his wound and he is wondering if that would help him.  Past Medical History:  Diagnosis Date  . Benign prostatic hypertrophy   . COPD (chronic obstructive pulmonary disease) ([place]) December 15, 2009   FEV1 2.30 (70%) ratio 63 no better with B2 and DLCO 18/6 (73%) corrects to 106%  . Degenerative joint disease   . Depression   . Diverticulosis   . Essential hypertension    ACE inhibitor cough  . Fatty liver   . Hyperlipidemia   . Internal hemorrhoids   . Low back pain   . Otitis externa 07/30/2012  . Splenomegaly   . Type 2 diabetes mellitus ([place])   . Ulcer of foot ([place])    Right foot     Past Surgical History:  Procedure Laterality Date  . NO PAST SURGERIES  1980     Current Outpatient Medications on File Prior to Visit  Medication Sig Dispense Refill  . acyclovir (ZOVIRAX) 400 MG tablet TAKE 1 TABLET TWICE DAILY 180 tablet 1  . albuterol (VENTOLIN HFA) 108 (90 Base) MCG/ACT inhaler Inhale 2 puffs into the lungs every 6 (six) hours as needed for wheezing or shortness of breath. 6.7 g 3  . amitriptyline (ELAVIL) 50 MG tablet TAKE 1 TABLET AT BEDTIME 90 tablet 1  . ASCORBIC ACID PO Take 1 tablet by mouth daily.     Marland Kitchen aspirin EC 81 MG tablet Take 81 mg by mouth daily.    . Blood Glucose Monitoring Suppl (TRUE METRIX AIR GLUCOSE METER) w/Device KIT     . busPIRone (BUSPAR) 5 MG tablet Take 1 tablet (5 mg total) by mouth 2 (two) times  daily as needed (anxiety). 60 tablet 5  . carvedilol (COREG) 6.25 MG tablet Take 1 tablet (6.25 mg total) by mouth 2 (two) times daily. 180 tablet 3  . Cholecalciferol 1000 UNITS capsule Take 1 capsule (1,000 Units total) by mouth 2 (two) times daily.    . clotrimazole-betamethasone (LOTRISONE) cream Apply 1 application topically 2 (two) times daily. For 7 days maximum for severe jock itch . Stop if worsening symptoms. 45 g 1  . escitalopram (LEXAPRO) 5 MG tablet Take 1 tablet (5 mg total) by mouth daily. 90 tablet 5  . famotidine (PEPCID) 20 MG tablet TAKE 1 TABLET TWICE DAILY 180 tablet 1  . finasteride (PROSCAR) 5 MG tablet TAKE 1 TABLET EVERY DAY 90 tablet 1  . furosemide (LASIX) 20 MG tablet Take 1 tablet (20 mg total) by mouth daily. 90 tablet 1  . gabapentin (NEURONTIN) 300 MG capsule Take 1 capsule (300 mg total) by mouth 2 (two) times daily. 180 capsule 3  . glucose blood (TRUE METRIX BLOOD GLUCOSE TEST) test strip TEST BLOOD SUGAR EVERY DAY 100 strip 1  . Multiple Vitamin (MULTIVITAMIN) capsule Take 1 capsule by mouth daily.      . mupirocin ointment (BACTROBAN) 2 % Apply to right foot ulcer once  daily and cover with dressing. 30 g 1  . NONFORMULARY OR COMPOUNDED ITEM [facility]  Peripheral Neuropathy Cream- Bupivacaine 1%, Doxepin 3%, Gabapentin 6%, Pentoxifylline 3%, Topiramate 1% Apply 1-2 grams to affected area 3-4 times daily Qty. 120 gm 3 refills    . omeprazole (PRILOSEC) 20 MG capsule TAKE 1 CAPSULE DAILY 30 TO 60 MINUTES BEFORE FIRST MEAL OF THE DAY 90 capsule 1  . rosuvastatin (CRESTOR) 10 MG tablet Take 1 tablet (10 mg total) by mouth daily. 90 tablet 3  . sodium chloride (MURO 128) 5 % ophthalmic solution Place 1 drop into the left eye at bedtime.    . tamsulosin (FLOMAX) 0.4 MG CAPS capsule TAKE 1 CAPSULE EVERY DAY 90 capsule 1  . TRUEPLUS LANCETS 28G MISC Use to test blood sugars two times daily. Dx: E11.9 200 each 3   No current facility-administered  medications on file prior to visit.     No Known Allergies   Family History  Problem Relation Age of Onset  . Melanoma Mother   . Stroke Father   . CAD Brother        Premature disease     Social History   Occupational History  . Occupation: retired    Comment: maintenence work  Tobacco Use  . Smoking status: Former Smoker    Packs/day: 4.00    Years: 20.00    Pack years: 80.00    Types: Cigarettes    Quit date: 09/18/1984    Years since quitting: 35.5  . Smokeless tobacco: Never Used  . Tobacco comment: quit in 1980  Vaping Use  . Vaping Use: Never used  Substance and Sexual Activity  . 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 Drug use: No  . Sexual activity: Yes     Immunization History  Administered Date(s) Administered  . Influenza Split 09/07/2011, 07/30/2012  . Influenza Whole 10/10/2005, 08/31/2009, 07/10/2010  . Influenza, High Dose Seasonal PF 06/22/2016, 06/29/2017, 09/20/2018, 07/11/2019  . Influenza,inj,Quad PF,6+ Mos 07/01/2013, 06/20/2014  . Influenza-Unspecified 10/26/2015, 09/12/2018  . Meningococcal Conjugate 07/24/2014  . Moderna SARS-COVID-2 Vaccination 10/22/2019, 12/02/2019  . Pneumococcal Conjugate-13 07/24/2014  . Pneumococcal Polysaccharide-23 08/31/2009  . Td 06/10/2006  . Zoster Recombinat (Shingrix) 12/11/2018, 05/03/2019     Review of systems: Positive Findings in bold print.  Constitutional:  chills, fatigue, fever, sweats, weight change Communication: Optometrist, sign Ecologist, hand writing, iPad/Android device Head: head injury Eyes: changes in vision, eye pain, glaucoma, cataracts, macular degeneration, diplopia, glare, light sensitivity, eyeglasses or contacts, blindness Ears nose mouth throat: hearing impaired, hearing aids,  ringing in ears, deaf, sign language,  vertigo, nosebleeds,  rhinitis,  cold sores, snoring, swollen glands Cardiovascular: HTN, edema, arrhythmia, pacemaker in place,  defibrillator in place, chest pain/tightness, chronic anticoagulation, blood clot, heart failure, MI Peripheral Vascular: leg cramps, varicose veins, blood clots, lymphedema, varicosities Respiratory:  asthma, difficulty breathing, denies congestion, SOB, wheezing, cough, emphysema, oxygen dependent Gastrointestinal: change in appetite or weight, abdominal pain, constipation, diarrhea, nausea, vomiting, vomiting blood, change in bowel habits, abdominal pain, jaundice, rectal bleeding, hemorrhoids, GERD Genitourinary:  nocturia,  pain on urination, polyuria,  blood in urine, Foley catheter, urinary urgency, ESRD on hemodialysis Musculoskeletal: amputation(s), cramping, stiff joints, painful joints, decreased joint motion, fractures, OA, gout, hemiplegia, paraplegia, uses cane, wheelchair bound, uses walker, uses rollator Skin: changes in toenails, color change, dryness, itching, mole changes,  rash, wound(s) Neurological: headaches, numbness in feet, paresthesias in feet, burning  in feet, fainting,  seizures, change in speech. denies headaches, memory problems/poor historian, cerebral palsy, weakness, paralysis, CVA, TIA, tremors Endocrine: diabetes, hypothyroidism, hyperthyroidism,  goiter, dry mouth, flushing, heat intolerance,  cold intolerance,  excessive thirst, denies polyuria,  nocturia Hematological:  easy bleeding, excessive bleeding, easy bruising, enlarged lymph nodes, on long term blood thinner, history of past transusions Allergy/immunological:  hives, eczema, frequent infections, multiple drug allergies, seasonal allergies, transplant recipient, multiple food allergies Psychiatric:  anxiety, depression, mood disorder, suicidal ideations, hallucinations, insomnia   Objective:  Physical Exam: There were no vitals filed for this visit.   VEASNA SANTIBANEZ is a pleasant 79 y.o. male in NAD. AAO x 3.  Vascular Examination: Neurovascular status unchanged b/l lower extremities. Capillary  refill time to digits immediate b/l. Palpable pedal pulses b/l LE. Pedal hair absent. Lower extremity skin temperature gradient within normal limits. No pain with calf compression b/l. No edema noted b/l lower extremities.  Dermatological Examination: Pedal skin with normal turgor, texture and tone bilaterally. No open wounds bilaterally. No interdigital macerations bilaterally. Toenails recently debrided 1-5 b/l.  No images are attached to the encounter.  Wound Location: submet head 1 right foot There is a minimal amount of devitalized tissue present in the wound. He has a dried blood blister roof noted submet head 1 right foot measuring 4.5 x 2.5 cm Wound Base: early epithelial tissue Peri-wound: Normal Exudate: None: wound tissue dry Blood Loss during debridement: 0 cc('s). Description of tissue removed from ulceration today: Devitalized blood blister roof. Sign(s) of clinical bacterial infection: no clinical signs of infection noted on examination today. Material in wound which inhibits healing/promotes adjacent tissue breakdown: N/A  Musculoskeletal Examination: Normal muscle strength 5/5 to all lower extremity muscle groups bilaterally. No pain crepitus or joint limitation noted with ROM b/l. Hammertoes noted to the 1-5 bilaterally.  Neurological Examination: Protective sensation diminished with 10g monofilament b/l. Vibratory sensation diminished b/l. Proprioception intact bilaterally.  Hemoglobin A1C Latest Ref Rng & Units 02/25/2020 11/07/2019  HGBA1C 4.0 - 5.6 % 5.9(A) 5.9(A)  Some recent data might be hidden    Assessment:  No diagnosis found. Plan:  -He is to continue wearing Darco Shoe and applying Mupirocin Ointment to right foot daily. -We have scheduled him for one week follow up. I have asked him to bring in his diabetic shoe for more offloading modification as well as to be measured for a new pair of diabetic shoes with custom molded insoles.  -Patient was evaluated  and treated and all questions answered.  -Patient/POA/Family member educated on diagnosis and treatment plan of routine ulcer debridement/wound care.  -Ulceration debridement achieved utilizing sharp debridement with sterile scalpel blade.. Type/amount of devitalized tissue removed: Dried blister roof.  -Today's ulcer size post-debridement:  -Ulceration cleansed with wound cleanser. Iodosorb Gel applied to base of ulceration and secured with light dressing. -Wound responded well to today's debridement. -Patient risk factors affecting healing of ulcer: -Frequency of debridement needed to achieve healing:  -[name] given written instructions on daily wound care for submet head 1 right foot ulceration. Continue Mupirocin Ointment to wound daily. -Frequency of debridements needed to achieve healing: weekly to biweekly.  Return in about 1 week (around 04/03/2020) for follow up ulcer.  Marzetta Board, DPM

## 2020-04-08 ENCOUNTER — Inpatient Hospital Stay: Payer: Medicare HMO

## 2020-04-08 ENCOUNTER — Inpatient Hospital Stay: Payer: Medicare HMO | Attending: Hematology | Admitting: Hematology

## 2020-04-08 ENCOUNTER — Other Ambulatory Visit: Payer: Self-pay

## 2020-04-08 VITALS — BP 123/62 | HR 92 | Temp 97.9°F | Resp 18 | Ht 71.0 in | Wt 204.4 lb

## 2020-04-08 DIAGNOSIS — E538 Deficiency of other specified B group vitamins: Secondary | ICD-10-CM | POA: Insufficient documentation

## 2020-04-08 DIAGNOSIS — D696 Thrombocytopenia, unspecified: Secondary | ICD-10-CM

## 2020-04-08 DIAGNOSIS — C220 Liver cell carcinoma: Secondary | ICD-10-CM

## 2020-04-08 DIAGNOSIS — D649 Anemia, unspecified: Secondary | ICD-10-CM | POA: Diagnosis not present

## 2020-04-08 DIAGNOSIS — Z87891 Personal history of nicotine dependence: Secondary | ICD-10-CM | POA: Insufficient documentation

## 2020-04-08 DIAGNOSIS — I10 Essential (primary) hypertension: Secondary | ICD-10-CM | POA: Insufficient documentation

## 2020-04-08 DIAGNOSIS — F1010 Alcohol abuse, uncomplicated: Secondary | ICD-10-CM | POA: Diagnosis not present

## 2020-04-08 DIAGNOSIS — K700 Alcoholic fatty liver: Secondary | ICD-10-CM | POA: Insufficient documentation

## 2020-04-08 DIAGNOSIS — E119 Type 2 diabetes mellitus without complications: Secondary | ICD-10-CM | POA: Insufficient documentation

## 2020-04-08 LAB — CMP (CANCER CENTER ONLY)
ALT: 10 U/L (ref 0–44)
AST: 22 U/L (ref 15–41)
Albumin: 3.4 g/dL — ABNORMAL LOW (ref 3.5–5.0)
Alkaline Phosphatase: 103 U/L (ref 38–126)
Anion gap: 8 (ref 5–15)
BUN: 12 mg/dL (ref 8–23)
CO2: 27 mmol/L (ref 22–32)
Calcium: 8.8 mg/dL — ABNORMAL LOW (ref 8.9–10.3)
Chloride: 108 mmol/L (ref 98–111)
Creatinine: 1.14 mg/dL (ref 0.61–1.24)
GFR, Est AFR Am: >60 mL/min (ref >60)
GFR, Estimated: >60 mL/min (ref >60)
Glucose, Bld: 159 mg/dL — ABNORMAL HIGH (ref 70–99)
Potassium: 4.5 mmol/L (ref 3.5–5.1)
Sodium: 143 mmol/L (ref 135–145)
Total Bilirubin: 0.3 mg/dL (ref 0.3–1.2)
Total Protein: 6.5 g/dL (ref 6.5–8.1)

## 2020-04-08 LAB — HEPATITIS B CORE ANTIBODY, TOTAL: Hep B Core Total Ab: NONREACTIVE

## 2020-04-08 LAB — PROTIME-INR
INR: 1.1 (ref 0.8–1.2)
Prothrombin Time: 14 seconds (ref 11.4–15.2)

## 2020-04-08 LAB — CBC WITH DIFFERENTIAL/PLATELET
Abs Immature Granulocytes: 0 10*3/uL (ref 0.00–0.07)
Basophils Absolute: 0 10*3/uL (ref 0.0–0.1)
Basophils Relative: 1 %
Eosinophils Absolute: 0.1 10*3/uL (ref 0.0–0.5)
Eosinophils Relative: 1 %
HCT: 33.9 % — ABNORMAL LOW (ref 39.0–52.0)
Hemoglobin: 10.8 g/dL — ABNORMAL LOW (ref 13.0–17.0)
Immature Granulocytes: 0 %
Lymphocytes Relative: 28 %
Lymphs Abs: 1.3 10*3/uL (ref 0.7–4.0)
MCH: 33.4 pg (ref 26.0–34.0)
MCHC: 31.9 g/dL (ref 30.0–36.0)
MCV: 105 fL — ABNORMAL HIGH (ref 80.0–100.0)
Monocytes Absolute: 0.3 10*3/uL (ref 0.1–1.0)
Monocytes Relative: 6 %
Neutro Abs: 3 10*3/uL (ref 1.7–7.7)
Neutrophils Relative %: 64 %
Platelets: 120 10*3/uL — ABNORMAL LOW (ref 150–400)
RBC: 3.23 MIL/uL — ABNORMAL LOW (ref 4.22–5.81)
RDW: 15.3 % (ref 11.5–15.5)
WBC: 4.7 10*3/uL (ref 4.0–10.5)
nRBC: 0 % (ref 0.0–0.2)

## 2020-04-08 LAB — HEPATITIS C ANTIBODY: HCV Ab: NONREACTIVE

## 2020-04-08 LAB — AMMONIA: Ammonia: 22 umol/L (ref 9–35)

## 2020-04-08 LAB — HEPATITIS B SURFACE ANTIGEN: Hepatitis B Surface Ag: NONREACTIVE

## 2020-04-08 NOTE — Progress Notes (Signed)
HEMATOLOGY/ONCOLOGY CLINIC NOTE  Date of Service: 04/08/2020  Patient Care Team: [name], MD as PCP - General (Family Medicine) Satira Sark, MD as PCP - Cardiology (Cardiology) Marilynne Halsted, MD as Referring Physician (Ophthalmology) Brunetta Genera, MD as Consulting Physician (Hematology) Pyrtle, Lajuan Lines, MD as Consulting Physician (Gastroenterology) Marzetta Board, DPM as Consulting Physician (Podiatry) Jonnie Finner, RN as Oncology Nurse Navigator  CHIEF COMPLAINTS/PURPOSE OF CONSULTATION:  Thrombocytopenia  HISTORY OF PRESENTING ILLNESS:   [name] is a wonderful 79 y.o. 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 He is accompanied today by his wife. The pt reports that he is doing well overall.   The pt reports that he has had low energy levels from the past 3-4 years since he retired. Today he feels fatigued as well.  He has been taking Metformin for the last 10 years and has recently begun replacing Vitamin B12. He has neuropathy in his feet and has experienced some falls and uses a cane at night. He is also taking Finasteride  Most recent lab results (02/28/18) of CBC  is as follows: all values are WNL except for RBC at 3.42, HGB at 11.4, HCT at 33.5, Platelets at 127k. Testosterone 02/13/18 was lower at 185  On review of systems, pt reports neuropathy in his feet, some fatigue, stable back pain, some ankle swelling, and denies abdominal pains and any other symptoms.   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 We are joined today by his wife. The patient's last visit with [place] was on 12/20/2018. The pt reports that he is  doing well overall.  The pt reports that he has fatty liver from a history of alcohol abuse. He was in the hospital on 12/03/19 with congestive heart failure. While in the hospital there became a concern for changes in his kidneys so they completed a CT Abd/Pel. 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 There was a plan with his Cardiologist to place a cardiac stent, but that has been put on hold until his liver mass is addressed. His breathing has improved since his diuretics were increased.   Pt complains of diarrhea for at least two years that is not improved with anti-diarrheal medication.   Of note since the patient's last visit, pt has had CT Abd/Pel ([id number]) completed on 02/14/2020 with results revealing "1. 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 2. 4 cm hypervascular mass in segment 4A of left hepatic lobe, and 8 mm hypervascular nodule in posterior right hepatic lobe. These are highly suspicious for hepatocellular carcinomas in the setting of cirrhosis. Consider abdomen MRI without and with contrast for further characterization. 3. No evidence of metastatic disease. 4. Colonic diverticulosis. No radiographic evidence of diverticulitis."  Pt has had MRI Abdomen ([id number]) completed on 03/27/2020 with results revealing "1. The dominant lesion of concern in the medial segment of the left hepatic lobe (segment 4 A) is diagnostic of hepatocellular carcinoma (LR 5). 2. The other small lesion questioned inferiorly in the right lobe is not clearly seen, although evaluation of this area is mildly limited by  motion artifact. 3. Small amount of ascites without definite evidence of metastatic disease. 4. Small dependent left pleural effusion and small hernia. 5. Mild extrahepatic biliary dilatation post cholecystectomy. No evidence of choledocholithiasis."  Lab results today (04/08/20) of CBC w/diff and CMP is as follows: all  values are WNL except for WBC at 11.8K, RBC at 3.25, Hgb at 10.8, HCT at 32.7, MCV at 100.7, RDW at 16.6, PLT at 101.0, Neutro Rel at 91.6, Lymphs Rel at 5.5, Mono Rel at 2.4, Neutro Abs at 10.8K, Glucose at 146, BUN at 29, Creatinine at 1.52, GFR at 44.46.  On review of systems, pt reports improved SOB, weight loss, diarrhea and denies abdominal pain, constipation and any other symptoms.    MEDICAL HISTORY:  Past Medical History:  Diagnosis Date  . Benign prostatic hypertrophy   . COPD (chronic obstructive pulmonary disease) ([place]) December 15, 2009   FEV1 2.30 (70%) ratio 63 no better with B2 and DLCO 18/6 (73%) corrects to 106%  . Degenerative joint disease   . Depression   . Diverticulosis   . Essential hypertension    ACE inhibitor cough  . Fatty liver   . Hyperlipidemia   . Internal hemorrhoids   . Low back pain   . Otitis externa 07/30/2012  . Splenomegaly   . Type 2 diabetes mellitus ([place])   . Ulcer of foot ([place])    Right foot    SURGICAL HISTORY: Past Surgical History:  Procedure Laterality Date  . NO PAST SURGERIES  1980    SOCIAL HISTORY: Social History   Socioeconomic History  . Marital status: Married    Spouse name: Not on file  . Number of children: Not on file  . Years of education: Not on file  . Highest education level: Not on file  Occupational History  . Occupation: retired    Comment: maintenence work  Tobacco Use  . Smoking status: Former Smoker    Packs/day: 4.00    Years: 20.00    Pack years: 80.00    Types: Cigarettes    Quit date: 09/18/1984    Years since quitting: 35.5  . Smokeless tobacco: Never Used  . Tobacco comment: quit in 1980  Vaping Use  . Vaping Use: Never used  Substance and Sexual Activity  . 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 Drug use: No  . Sexual activity: Yes  Other Topics Concern  . Not on file  Social History Narrative   Married 53 years in 2015. 4 kids (2 sons) and 3 grandkids.     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 Difficulty of Paying Living Expenses:   Food Insecurity:   . Worried About Charity fundraiser in the Last Year:   . Arboriculturist in the Last Year:   Transportation Needs:   . Film/video editor (Medical):   Marland Kitchen Lack of Transportation (Non-Medical):   Physical Activity:   . Days of Exercise per Week:   . Minutes of Exercise per Session:   Stress:   . Feeling of Stress :   Social Connections:   . Frequency of Communication with Friends and Family:   . Frequency of  Social Gatherings with Friends and Family:   . Attends Religious Services:   . Active Member of Clubs or Organizations:   . Attends Archivist Meetings:   Marland Kitchen Marital Status:   Intimate Partner Violence:   . Fear of Current or Ex-Partner:   . Emotionally Abused:   Marland Kitchen Physically Abused:   . Sexually Abused:     FAMILY HISTORY: Family History  Problem Relation Age of Onset  . Melanoma Mother   . Stroke Father   . CAD Brother        Premature disease    ALLERGIES:  has No Known Allergies.  MEDICATIONS:  Current Outpatient Medications  Medication Sig Dispense Refill  . acyclovir (ZOVIRAX) 400 MG tablet TAKE 1 TABLET TWICE DAILY 180 tablet 1  . albuterol (VENTOLIN HFA) 108 (90 Base) MCG/ACT inhaler Inhale 2 puffs into the lungs every 6 (six) hours as needed for wheezing or shortness of breath. 6.7 g 3  . amitriptyline (ELAVIL) 50 MG tablet TAKE 1 TABLET AT BEDTIME 90 tablet 1  . ASCORBIC ACID PO Take 1 tablet by mouth daily.     Marland Kitchen aspirin EC 81 MG tablet Take 81 mg by mouth daily.    . Blood Glucose Monitoring Suppl (TRUE METRIX AIR GLUCOSE METER) w/Device KIT     . busPIRone (BUSPAR) 5 MG tablet Take 1 tablet (5 mg total) by mouth 2 (two) times daily as needed (anxiety). 60 tablet 5  .  carvedilol (COREG) 6.25 MG tablet Take 1 tablet (6.25 mg total) by mouth 2 (two) times daily. 180 tablet 3  . Cholecalciferol 1000 UNITS capsule Take 1 capsule (1,000 Units total) by mouth 2 (two) times daily.    . clotrimazole-betamethasone (LOTRISONE) cream Apply 1 application topically 2 (two) times daily. For 7 days maximum for severe jock itch . Stop if worsening symptoms. 45 g 1  . escitalopram (LEXAPRO) 5 MG tablet Take 1 tablet (5 mg total) by mouth daily. 90 tablet 5  . famotidine (PEPCID) 20 MG tablet TAKE 1 TABLET TWICE DAILY 180 tablet 1  . finasteride (PROSCAR) 5 MG tablet TAKE 1 TABLET EVERY DAY 90 tablet 1  . gabapentin (NEURONTIN) 300 MG capsule Take 1 capsule (300 mg total) by mouth 2 (two) times daily. 180 capsule 3  . glucose blood (TRUE METRIX BLOOD GLUCOSE TEST) test strip TEST BLOOD SUGAR EVERY DAY 100 strip 1  . Multiple Vitamin (MULTIVITAMIN) capsule Take 1 capsule by mouth daily.      . mupirocin ointment (BACTROBAN) 2 % Apply to right foot ulcer once daily and cover with dressing. 30 g 1  . NONFORMULARY OR COMPOUNDED ITEM [facility]  Peripheral Neuropathy Cream- Bupivacaine 1%, Doxepin 3%, Gabapentin 6%, Pentoxifylline 3%, Topiramate 1% Apply 1-2 grams to affected area 3-4 times daily Qty. 120 gm 3 refills    . omeprazole (PRILOSEC) 20 MG capsule TAKE 1 CAPSULE DAILY 30 TO 60 MINUTES BEFORE FIRST MEAL OF THE DAY 90 capsule 1  . rosuvastatin (CRESTOR) 10 MG tablet Take 1 tablet (10 mg total) by mouth daily. 90 tablet 3  . sacubitril-valsartan (ENTRESTO) 49-51 MG Take 1 tablet by mouth 2 (two) times daily. 180 tablet 3  . sodium chloride (MURO 128) 5 % ophthalmic solution Place 1 drop into the left eye at bedtime.    . tamsulosin (FLOMAX) 0.4 MG CAPS capsule TAKE 1 CAPSULE EVERY DAY 90 capsule 1  . TRUEPLUS LANCETS 28G MISC Use to test blood sugars two times daily. Dx:  E11.9 200 each 3  . furosemide (LASIX) 20 MG tablet Take 1 tablet (20 mg total) by mouth daily.  90 tablet 1   No current facility-administered medications for this visit.    REVIEW OF SYSTEMS:   A 10+ POINT REVIEW OF SYSTEMS WAS OBTAINED including neurology, dermatology, psychiatry, cardiac, respiratory, lymph, extremities, GI, GU, Musculoskeletal, constitutional, breasts, reproductive, HEENT.  All pertinent positives are noted in the HPI.  All others are negative.   PHYSICAL EXAMINATION:  . Vitals:   04/08/20 1329  BP: 123/62  Pulse: 92  Resp: 18  Temp: 97.9 F (36.6 C)  SpO2: 93%   Filed Weights   04/08/20 1329  Weight: 204 lb 6.4 oz (92.7 kg)   .Body mass index is 28.51 kg/m.  Exam was given in a chair   GENERAL:alert, in no acute distress and comfortable SKIN: no acute rashes, no significant lesions EYES: conjunctiva are pink and non-injected, sclera anicteric OROPHARYNX: MMM, no exudates, no oropharyngeal erythema or ulceration NECK: supple, no JVD LYMPH:  no palpable lymphadenopathy in the cervical, axillary or inguinal regions LUNGS: clear to auscultation b/l with normal respiratory effort HEART: regular rate & rhythm ABDOMEN:  normoactive bowel sounds , non tender, not distended. No palpable hepatosplenomegaly.  Extremity: 2+ pedal edema PSYCH: alert & oriented x 3 with fluent speech NEURO: no focal motor/sensory deficits  LABORATORY DATA:  I have reviewed the data as listed  . CBC Latest Ref Rng & Units 04/08/2020 01/22/2020 12/13/2019  WBC 4.0 - 10.5 K/uL 4.7 11.8(H) 5.9  Hemoglobin 13.0 - 17.0 g/dL 10.8(L) 10.8(L) 11.2(L)  Hematocrit 39 - 52 % 33.9(L) 32.7(L) 32.5(L)  Platelets 150 - 400 K/uL 120(L) 101.0(L) 134.0(L)   . CBC    Component Value Date/Time   WBC 4.7 04/08/2020 1447   RBC 3.23 (L) 04/08/2020 1447   HGB 10.8 (L) 04/08/2020 1447   HGB 11.6 (L) 02/13/2018 1550   HCT 33.9 (L) 04/08/2020 1447   HCT 33.1 (L) 06/21/2018 1332   PLT 120 (L) 04/08/2020 1447   PLT 118 (L) 02/13/2018 1550   MCV 105.0 (H) 04/08/2020 1447   MCH 33.4  04/08/2020 1447   MCHC 31.9 04/08/2020 1447   RDW 15.3 04/08/2020 1447   LYMPHSABS 1.3 04/08/2020 1447   MONOABS 0.3 04/08/2020 1447   EOSABS 0.1 04/08/2020 1447   BASOSABS 0.0 04/08/2020 1447    . CMP Latest Ref Rng & Units 04/08/2020 03/17/2020 02/05/2020  Glucose 70 - 99 mg/dL 159(H) 128(H) 151(H)  BUN 8 - 23 mg/dL _0 Creatinine 0.61 - 1.24 mg/dL 1.14 0.93 1.07  Sodium 135 - 145 mmol/L 143 141 141  Potassium 3.5 - 5.1 mmol/L 4.5 4.4 4.6  Chloride 98 - 111 mmol/L 108 105 105  CO2 22 - 32 mmol/L _1 Calcium 8.9 - 10.3 mg/dL 8.8(L) 9.0 8.7  Total Protein 6.5 - 8.1 g/dL 6.5 - -  Total Bilirubin 0.3 - 1.2 mg/dL 0.3 - -  Alkaline Phos 38 - 126 U/L 103 - -  AST 15 - 41 U/L 22 - -  ALT 0 - 44 U/L 10 - -   Component     Latest Ref Rng & Units 06/21/2018  Folate, Hemolysate     Not Estab. ng/mL >620.0  HCT     37.5 - 51.0 % 33.1 (L)  Folate, RBC     >498 ng/mL >1,873  Methylmalonic Acid, Quantitative     0 - 378 nmol/L 294  DISCLAIMER  Comment  Specimen Type      [place]  Lead-Whole Blood     0 - 4 ug/dL 2  Vitamin B12     180 - 914 pg/mL 836  Homocysteine     0.0 - 15.0 umol/L 11.2    02/28/18 Surgical Pathology:   02/28/18 Cytogenetics:    RADIOGRAPHIC STUDIES: I have personally reviewed the radiological images as listed and agreed with the findings in the report. MR Abdomen W Wo Contrast  Result Date: 03/30/2020 CLINICAL DATA:  Hypervascular hepatic lesion abdominal CT suspicious for hepatocellular carcinoma. History of cirrhosis. EXAM: MRI ABDOMEN WITHOUT AND WITH CONTRAST TECHNIQUE: Multiplanar multisequence MR imaging of the abdomen was performed both before and after the administration of intravenous contrast. CONTRAST:  60m MULTIHANCE GADOBENATE DIMEGLUMINE 529 MG/ML IV SOLN COMPARISON:  Abdominal ultrasound 12/04/2019. Abdominopelvic CT 02/14/2020 and chest CTA 12/03/2019. FINDINGS: Lower chest: Small dependent left pleural effusion and small  hernia. The visualized lower chest otherwise appears unremarkable. Hepatobiliary: There are morphologic changes of cirrhosis. The dominant lesion of concern in the medial segment of the left hepatic lobe (segment 4 A) demonstrates intratumoral fat with signal dropout on the gradient echo opposed phase images, heterogeneous T2 hyperintensity and non rim arterial phase enhancement. There is an enhancing capsule with non peripheral washout on the delayed images. This lesion measures 3.7 x 2.6 cm on image 32/17 and is diagnostic of hepatocellular carcinoma (LR 5). The other small lesion questioned inferiorly in the right lobe is not clearly seen, although evaluation of this area is mildly limited by motion artifact. Mild extrahepatic biliary dilatation with the common hepatic duct measuring 10 mm in diameter. The duct tapers distally. No evidence of choledocholithiasis. Previous cholecystectomy. Pancreas: Atrophy. No pancreatic ductal dilatation or surrounding inflammation. Spleen: Normal in size without focal abnormality. Adrenals/Urinary Tract: Both adrenal glands appear normal. Bilateral renal cortical scarring and cyst formation. No hydronephrosis or enhancing renal mass. Stomach/Bowel: No evidence of bowel wall thickening, distention or surrounding inflammatory change. Vascular/Lymphatic: There are no enlarged abdominal lymph nodes. Diffuse aortic and branch vessel atherosclerosis. The portal, superior mesenteric and splenic veins are patent. No evidence of portal vein tumor thrombus. Other: Small amount of ascites without peritoneal nodularity or definite abnormal associated enhancement. Musculoskeletal: No acute or significant osseous findings. Diffuse lumbar spondylosis. IMPRESSION: 1. The dominant lesion of concern in the medial segment of the left hepatic lobe (segment 4 A) is diagnostic of hepatocellular carcinoma (LR 5). 2. The other small lesion questioned inferiorly in the right lobe is not clearly seen,  although evaluation of this area is mildly limited by motion artifact. 3. Small amount of ascites without definite evidence of metastatic disease. 4. Small dependent left pleural effusion and small hernia. 5. Mild extrahepatic biliary dilatation post cholecystectomy. No evidence of choledocholithiasis. Electronically Signed   By: WRichardean SaleM.D.   On: 03/30/2020 09:21    ASSESSMENT & PLAN:   79y.o. male with  1. Thrombocytopenia  Mild Plt 127k with minimal normocytic anemia. 02/28/18 surgical pathology report with no overt pathology, could not rule out MDS, and noted an increase in ring sideroblasts 02/28/18 BM cytogenetics report was unremarkable  2. 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 -Discussed 02/14/2020 CT Abd/Pel ([id number]) which revealed "1. 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 2. 4 cm hypervascular mass in segment 4A of left hepatic lobe, and 8 mm hypervascular nodule in posterior right hepatic lobe. These are highly suspicious for hepatocellular carcinomas in the setting of cirrhosis. 3. No evidence of metastatic disease." -Discussed 03/27/2020 MRI Abdomen ([id number]) which revealed "1. The dominant lesion of concern in the medial segment of the left hepatic lobe (segment 4 A) is diagnostic of hepatocellular carcinoma (LR 5). 2. The other small lesion questioned inferiorly in the right lobe is not clearly seen, although evaluation of this area is mildly limited by motion artifact." -Advised pt that based on Radiology evaluation he has a localized primary  Hepatocellular Carcinoma  -Advised pt that liver cirrhosis is a risk factor for Hepatocellular Carcinoma.  -Advised pt that he will not be a good candidate for surgery due to liver dysfunction & other medical conditions  -Given pt's medical history treatment options would include: localized ablation, arterially directed therapies, or external beam radiation.  -Will discuss best therapeutic approach for newly diagnosed [facility] with Interventional Radiology  -Will get labs today, including AFP for baseline  -Will see back in 2 months with labs    FOLLOW UP: -Labs today -IR referral for consideration of TACE/ablative therapies for newly diagnosed [place]. RTC with Dr Irene Limbo in 2 months with labs  . Orders Placed This Encounter  Procedures  . CBC with Differential/Platelet    Standing Status:   Future    Number of Occurrences:   1    Standing Expiration Date:   04/08/2021  . CMP ([place] only)    Standing Status:   Future    Number of Occurrences:   1    Standing Expiration Date:   04/08/2021  . AFP tumor marker    Standing Status:   Future    Number of Occurrences:   1    Standing Expiration Date:   04/08/2021  . Hepatitis C antibody    Standing Status:   Future    Number of Occurrences:   1    Standing Expiration Date:   04/08/2021  . Hepatitis B core antibody, total    Standing Status:   Future    Number of Occurrences:   1    Standing Expiration Date:   04/08/2021  . Hepatitis B surface antigen    Standing Status:   Future    Number of Occurrences:   1    Standing Expiration Date:   04/08/2021  . Protime-INR    Standing Status:   Future    Number of Occurrences:   1    Standing Expiration Date:   04/08/2021  . Ammonia    Standing Status:   Future    Number of Occurrences:   1    Standing Expiration Date:   04/08/2021     The total time spent in the appt was 45 minutes and more than 50% was on counseling and direct patient cares.  All of the patient's questions were answered  with apparent satisfaction. The patient knows to call the clinic with any problems, questions or concerns.   (Office):       [phone] (Work cell):  [phone] (Fax):           [phone]  04/08/2020 4:09 PM  I, Yevette Edwards, am acting as a Education administrator for Dr. Sullivan Lone.   .I have reviewed the above documentation for accuracy and completeness, and I agree with the above. Brunetta Genera MD

## 2020-04-08 NOTE — Progress Notes (Signed)
Subjective:  Patient ID: [name], male    DOB: 01-12-1941,  MRN: [id number]  79 y.o. male is seen for follow up ulcer submet head 1 right foot.  Prescribed wound care regimen: Mupirocin Ointment dressing once daily.  Patient does not know if wound is better than previous visit.  Patient denies fever, chills, night sweats, nausea, or vomiting.  Patient also asking about being measured for his new diabetic shoes.  He states he has just been diagnosed with liver cancer. His family will be attending that follow up appointment with him.   Past Medical History:  Diagnosis Date  . Benign prostatic hypertrophy   . COPD (chronic obstructive pulmonary disease) ([place]) December 15, 2009   FEV1 2.30 (70%) ratio 63 no better with B2 and DLCO 18/6 (73%) corrects to 106%  . Degenerative joint disease   . Depression   . Diverticulosis   . Essential hypertension    ACE inhibitor cough  . Fatty liver   . Hyperlipidemia   . Internal hemorrhoids   . Low back pain   . Otitis externa 07/30/2012  . Splenomegaly   . Type 2 diabetes mellitus ([place])   . Ulcer of foot ([place])    Right foot     Past Surgical History:  Procedure Laterality Date  . NO PAST SURGERIES  1980     Current Outpatient Medications on File Prior to Visit  Medication Sig Dispense Refill  . acyclovir (ZOVIRAX) 400 MG tablet TAKE 1 TABLET TWICE DAILY 180 tablet 1  . albuterol (VENTOLIN HFA) 108 (90 Base) MCG/ACT inhaler Inhale 2 puffs into the lungs every 6 (six) hours as needed for wheezing or shortness of breath. 6.7 g 3  . amitriptyline (ELAVIL) 50 MG tablet TAKE 1 TABLET AT BEDTIME 90 tablet 1  . ASCORBIC ACID PO Take 1 tablet by mouth daily.     Marland Kitchen aspirin EC 81 MG tablet Take 81 mg by mouth daily.    . Blood Glucose Monitoring Suppl (TRUE METRIX AIR GLUCOSE METER) w/Device KIT     . busPIRone (BUSPAR) 5 MG tablet Take 1 tablet (5 mg total) by mouth 2 (two) times daily as needed (anxiety). 60 tablet 5  . carvedilol  (COREG) 6.25 MG tablet Take 1 tablet (6.25 mg total) by mouth 2 (two) times daily. 180 tablet 3  . Cholecalciferol 1000 UNITS capsule Take 1 capsule (1,000 Units total) by mouth 2 (two) times daily.    . clotrimazole-betamethasone (LOTRISONE) cream Apply 1 application topically 2 (two) times daily. For 7 days maximum for severe jock itch . Stop if worsening symptoms. 45 g 1  . escitalopram (LEXAPRO) 5 MG tablet Take 1 tablet (5 mg total) by mouth daily. 90 tablet 5  . famotidine (PEPCID) 20 MG tablet TAKE 1 TABLET TWICE DAILY 180 tablet 1  . finasteride (PROSCAR) 5 MG tablet TAKE 1 TABLET EVERY DAY 90 tablet 1  . gabapentin (NEURONTIN) 300 MG capsule Take 1 capsule (300 mg total) by mouth 2 (two) times daily. 180 capsule 3  . glucose blood (TRUE METRIX BLOOD GLUCOSE TEST) test strip TEST BLOOD SUGAR EVERY DAY 100 strip 1  . Multiple Vitamin (MULTIVITAMIN) capsule Take 1 capsule by mouth daily.      . mupirocin ointment (BACTROBAN) 2 % Apply to right foot ulcer once daily and cover with dressing. 30 g 1  . NONFORMULARY OR COMPOUNDED ITEM [facility]  Peripheral Neuropathy Cream- Bupivacaine 1%, Doxepin 3%, Gabapentin 6%, Pentoxifylline 3%, Topiramate 1% Apply  1-2 grams to affected area 3-4 times daily Qty. 120 gm 3 refills    . omeprazole (PRILOSEC) 20 MG capsule TAKE 1 CAPSULE DAILY 30 TO 60 MINUTES BEFORE FIRST MEAL OF THE DAY 90 capsule 1  . rosuvastatin (CRESTOR) 10 MG tablet Take 1 tablet (10 mg total) by mouth daily. 90 tablet 3  . sodium chloride (MURO 128) 5 % ophthalmic solution Place 1 drop into the left eye at bedtime.    . tamsulosin (FLOMAX) 0.4 MG CAPS capsule TAKE 1 CAPSULE EVERY DAY 90 capsule 1  . TRUEPLUS LANCETS 28G MISC Use to test blood sugars two times daily. Dx: E11.9 200 each 3  . furosemide (LASIX) 20 MG tablet Take 1 tablet (20 mg total) by mouth daily. 90 tablet 1  . sacubitril-valsartan (ENTRESTO) 49-51 MG Take 1 tablet by mouth 2 (two) times daily. 180 tablet 3    No current facility-administered medications on file prior to visit.     No Known Allergies   Family History  Problem Relation Age of Onset  . Melanoma Mother   . Stroke Father   . CAD Brother        Premature disease     Social History   Occupational History  . Occupation: retired    Comment: maintenence work  Tobacco Use  . Smoking status: Former Smoker    Packs/day: 4.00    Years: 20.00    Pack years: 80.00    Types: Cigarettes    Quit date: 09/18/1984    Years since quitting: 35.5  . Smokeless tobacco: Never Used  . Tobacco comment: quit in 1980  Vaping Use  . Vaping Use: Never used  Substance and Sexual Activity  . 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 Drug use: No  . Sexual activity: Yes     Immunization History  Administered Date(s) Administered  . Influenza Split 09/07/2011, 07/30/2012  . Influenza Whole 10/10/2005, 08/31/2009, 07/10/2010  . Influenza, High Dose Seasonal PF 06/22/2016, 06/29/2017, 09/20/2018, 07/11/2019  . Influenza,inj,Quad PF,6+ Mos 07/01/2013, 06/20/2014  . Influenza-Unspecified 10/26/2015, 09/12/2018  . Meningococcal Conjugate 07/24/2014  . Moderna SARS-COVID-2 Vaccination 10/22/2019, 12/02/2019  . Pneumococcal Conjugate-13 07/24/2014  . Pneumococcal Polysaccharide-23 08/31/2009  . Td 06/10/2006  . Zoster Recombinat (Shingrix) 12/11/2018, 05/03/2019    Objective:  Physical Exam: There were no vitals filed for this visit.   [name] is a pleasant 79 y.o. male in NAD. AAO x 3.  Vascular Examination: Capillary refill time to digits immediate b/l. Palpable pedal pulses b/l LE. Pedal hair absent. Lower extremity skin temperature gradient within normal limits. No pain with calf compression b/l. No edema noted b/l lower extremities.  Dermatological Examination: Pedal skin with normal turgor, texture and tone bilaterally. No interdigital macerations bilaterally. Toenails 1-5 b/l well maintained with  adequate length. No erythema, no edema, no drainage, no flocculence.   Wound Location: submet head 1 right foot There is a minimal amount of devitalized tissue present in the wound. Predebridement Wound Measurement:  0.2  x 0.2 cm. Postdebridement Wound Measurement: 0.3 x 0.2 x 0.1cm. Wound Base: Granular/Healthy Peri-wound: Calloused Exudate: None: wound tissue dry Blood Loss during debridement: 0 cc('s). Description of tissue removed from ulceration today:  hyperkeratosis. Sign(s) of clinical bacterial infection: no clinical signs of infection noted on examination today. Material in wound which inhibits healing/promotes adjacent tissue breakdown:  Devitalized hyperkeratosis.  Musculoskeletal Examination: Normal muscle strength 5/5 to all lower extremity muscle  groups bilaterally. No pain crepitus or joint limitation noted with ROM b/l. Hammertoes noted to the 1-5 bilaterally.  Neurological Examination: Protective sensation diminished with 10g monofilament b/l.  Hemoglobin A1C Latest Ref Rng & Units 02/25/2020 11/07/2019  HGBA1C 4.0 - 5.6 % 5.9(A) 5.9(A)  Some recent data might be hidden   Assessment:  No diagnosis found. Plan:  -Patient was evaluated and treated and all questions answered.  -Patient/POA/Family member educated on diagnosis and treatment plan of routine ulcer debridement/wound care.  -Ulceration debridement achieved utilizing sharp debridement with sterile scalpel blade.. Type/amount of devitalized tissue removed:hyperkeratosis.  -Today's ulcer size post-debridement: 0.3 x 0.2 x 0.1 cm. -Ulceration cleansed with wound cleanser. triple antibiotic ointment applied to base of ulceration and secured with light dressing. -Wound responded well to today's debridement. -Patient risk factors affecting healing of ulcer:NIDDM with neuropathy, debility. -Frequency of debridement needed to achieve healing: weekly to biweekly -Pedorthist offloaded Darco shoe and also measured him  for his new diabetic shoes.  -He is to continue once daily Mupirocin Ointment dressing changes to right foot. -Patient to report any pedal injuries to medical professional immediately. -Patient/POA to call should there be question/concern in the interim.  Return in about 2 weeks (around 04/17/2020).  Marzetta Board, DPM

## 2020-04-08 NOTE — Patient Instructions (Signed)
Thank you for choosing [facility] to provide your oncology and hematology care.   Should you have questions after your visit to the [facility] ([facility]), please contact this office at [phone] between 8:30 AM and 4:30 PM.  Voice mails left after 4:00 PM may not be returned until the following business day.  Calls received after 4:30 PM will be answered by an off-site Nurse Triage Line.    Prescription Refills:  Please have your pharmacy contact us directly for most prescription requests.  Contact the office directly for refills of narcotics (pain medications). Allow 48-72 hours for refills.  Appointments: Please contact the [facility] scheduling department [phone] for questions regarding [facility] appointment scheduling.  Contact the schedulers with any scheduling changes so that your appointment can be rescheduled in a timely manner.   Central Scheduling for [facility] [phone] - Call to schedule procedures such as PET scans, CT scans, MRI, Ultrasound, etc.  To afford each patient quality time with our providers, please arrive 30 minutes before your scheduled appointment time.  If you arrive late for your appointment, you may be asked to reschedule.  We strive to give you quality time with our providers, and arriving late affects you and other patients whose appointments are after yours. If you are a no show for multiple scheduled visits, you may be dismissed from the clinic at the providers discretion.     Resources: [facility] Social Workers [phone] for additional information on assistance programs or assistance connecting with community support programs   [place] DSS  [phone]: Information regarding food stamps, Medicaid, and utility assistance [place] [phone]   [place] [employer]'s shared-ride transportation service for eligible riders who have a disability that prevents them from riding the fixed route bus.   Medicare Rights Center  800-333-4114 Helps people with Medicare understand their rights and benefits, navigate the Medicare system, and secure the quality healthcare they deserve [employer] 800-227-2345 Assists patients locate various types of support and financial assistance Cancer Care: 1-800-813-HOPE (4673) Provides financial assistance, online support groups, medication/co-pay assistance.   Transportation Assistance for appointments at [facility]: Transportation Coordinator [phone]  Again, thank you for choosing [facility] for your care.       

## 2020-04-09 ENCOUNTER — Telehealth: Payer: Self-pay | Admitting: Hematology

## 2020-04-09 LAB — AFP TUMOR MARKER: AFP, Serum, Tumor Marker: 3.7 ng/mL (ref 0.0–8.3)

## 2020-04-09 NOTE — Telephone Encounter (Signed)
Scheduled per 06/30 los, patient has been called and notified. 

## 2020-04-14 ENCOUNTER — Other Ambulatory Visit: Payer: Self-pay

## 2020-04-14 DIAGNOSIS — C220 Liver cell carcinoma: Secondary | ICD-10-CM

## 2020-04-15 ENCOUNTER — Encounter: Payer: Self-pay | Admitting: Podiatry

## 2020-04-15 ENCOUNTER — Ambulatory Visit: Payer: Medicare HMO | Admitting: Podiatry

## 2020-04-15 ENCOUNTER — Other Ambulatory Visit: Payer: Self-pay

## 2020-04-15 DIAGNOSIS — L97501 Non-pressure chronic ulcer of other part of unspecified foot limited to breakdown of skin: Secondary | ICD-10-CM

## 2020-04-15 DIAGNOSIS — E1142 Type 2 diabetes mellitus with diabetic polyneuropathy: Secondary | ICD-10-CM | POA: Diagnosis not present

## 2020-04-15 DIAGNOSIS — E08621 Diabetes mellitus due to underlying condition with foot ulcer: Secondary | ICD-10-CM | POA: Diagnosis not present

## 2020-04-17 ENCOUNTER — Other Ambulatory Visit: Payer: Self-pay | Admitting: Hematology

## 2020-04-17 DIAGNOSIS — C220 Liver cell carcinoma: Secondary | ICD-10-CM

## 2020-04-17 NOTE — Progress Notes (Signed)
Referral to IR placed for consideration of liver directed ablative rx vs TACE for newly diagnosed [facility]

## 2020-04-19 NOTE — Progress Notes (Signed)
Subjective:  Patient ID: [name], male    DOB: 01/03/1941,  MRN: [id number]  79 y.o. male is seen for follow up ulcer submet head 1 right foot.  Prescribed wound care regimen: Mupirocin Ointment dressing daily.  Patient is using [employer].  Patient relates wound has improved.  Patient The patient is having no constitutional symptoms, denying fever, chills, anorexia, or weight loss..  Past Medical History:  Diagnosis Date  . Benign prostatic hypertrophy   . COPD (chronic obstructive pulmonary disease) ([place]) December 15, 2009   FEV1 2.30 (70%) ratio 63 no better with B2 and DLCO 18/6 (73%) corrects to 106%  . Degenerative joint disease   . Depression   . Diverticulosis   . Essential hypertension    ACE inhibitor cough  . Fatty liver   . Hyperlipidemia   . Internal hemorrhoids   . Low back pain   . Otitis externa 07/30/2012  . Splenomegaly   . Type 2 diabetes mellitus ([place])   . Ulcer of foot ([place])    Right foot     Past Surgical History:  Procedure Laterality Date  . NO PAST SURGERIES  1980     Current Outpatient Medications on File Prior to Visit  Medication Sig Dispense Refill  . acyclovir (ZOVIRAX) 400 MG tablet TAKE 1 TABLET TWICE DAILY 180 tablet 1  . albuterol (VENTOLIN HFA) 108 (90 Base) MCG/ACT inhaler Inhale 2 puffs into the lungs every 6 (six) hours as needed for wheezing or shortness of breath. 6.7 g 3  . amitriptyline (ELAVIL) 50 MG tablet TAKE 1 TABLET AT BEDTIME 90 tablet 1  . ASCORBIC ACID PO Take 1 tablet by mouth daily.     Marland Kitchen aspirin EC 81 MG tablet Take 81 mg by mouth daily.    . Blood Glucose Monitoring Suppl (TRUE METRIX AIR GLUCOSE METER) w/Device KIT     . busPIRone (BUSPAR) 5 MG tablet Take 1 tablet (5 mg total) by mouth 2 (two) times daily as needed (anxiety). 60 tablet 5  . carvedilol (COREG) 6.25 MG tablet Take 1 tablet (6.25 mg total) by mouth 2 (two) times daily. 180 tablet 3  . Cholecalciferol 1000 UNITS capsule Take 1 capsule (1,000  Units total) by mouth 2 (two) times daily.    . clotrimazole-betamethasone (LOTRISONE) cream Apply 1 application topically 2 (two) times daily. For 7 days maximum for severe jock itch . Stop if worsening symptoms. 45 g 1  . escitalopram (LEXAPRO) 5 MG tablet Take 1 tablet (5 mg total) by mouth daily. 90 tablet 5  . famotidine (PEPCID) 20 MG tablet TAKE 1 TABLET TWICE DAILY 180 tablet 1  . finasteride (PROSCAR) 5 MG tablet TAKE 1 TABLET EVERY DAY 90 tablet 1  . furosemide (LASIX) 20 MG tablet Take 1 tablet (20 mg total) by mouth daily. 90 tablet 1  . gabapentin (NEURONTIN) 300 MG capsule Take 1 capsule (300 mg total) by mouth 2 (two) times daily. 180 capsule 3  . glucose blood (TRUE METRIX BLOOD GLUCOSE TEST) test strip TEST BLOOD SUGAR EVERY DAY 100 strip 1  . Multiple Vitamin (MULTIVITAMIN) capsule Take 1 capsule by mouth daily.      . mupirocin ointment (BACTROBAN) 2 % Apply to right foot ulcer once daily and cover with dressing. 30 g 1  . NONFORMULARY OR COMPOUNDED ITEM [facility]  Peripheral Neuropathy Cream- Bupivacaine 1%, Doxepin 3%, Gabapentin 6%, Pentoxifylline 3%, Topiramate 1% Apply 1-2 grams to affected area 3-4 times daily Qty. 120 gm  3 refills    . omeprazole (PRILOSEC) 20 MG capsule TAKE 1 CAPSULE DAILY 30 TO 60 MINUTES BEFORE FIRST MEAL OF THE DAY 90 capsule 1  . rosuvastatin (CRESTOR) 10 MG tablet Take 1 tablet (10 mg total) by mouth daily. 90 tablet 3  . sacubitril-valsartan (ENTRESTO) 49-51 MG Take 1 tablet by mouth 2 (two) times daily. 180 tablet 3  . sodium chloride (MURO 128) 5 % ophthalmic solution Place 1 drop into the left eye at bedtime.    . tamsulosin (FLOMAX) 0.4 MG CAPS capsule TAKE 1 CAPSULE EVERY DAY 90 capsule 1  . TRUEPLUS LANCETS 28G MISC Use to test blood sugars two times daily. Dx: E11.9 200 each 3   No current facility-administered medications on file prior to visit.     No Known Allergies   Family History  Problem Relation Age of Onset  .  Melanoma Mother   . Stroke Father   . CAD Brother        Premature disease     Social History   Occupational History  . Occupation: retired    Comment: maintenence work  Tobacco Use  . Smoking status: Former Smoker    Packs/day: 4.00    Years: 20.00    Pack years: 80.00    Types: Cigarettes    Quit date: 09/18/1984    Years since quitting: 35.6  . Smokeless tobacco: Never Used  . Tobacco comment: quit in 1980  Vaping Use  . Vaping Use: Never used  Substance and Sexual Activity  . 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 Drug use: No  . Sexual activity: Yes     Immunization History  Administered Date(s) Administered  . Influenza Split 09/07/2011, 07/30/2012  . Influenza Whole 10/10/2005, 08/31/2009, 07/10/2010  . Influenza, High Dose Seasonal PF 06/22/2016, 06/29/2017, 09/20/2018, 07/11/2019  . Influenza,inj,Quad PF,6+ Mos 07/01/2013, 06/20/2014  . Influenza-Unspecified 10/26/2015, 09/12/2018  . Meningococcal Conjugate 07/24/2014  . Moderna SARS-COVID-2 Vaccination 10/22/2019, 12/02/2019  . Pneumococcal Conjugate-13 07/24/2014  . Pneumococcal Polysaccharide-23 08/31/2009  . Td 06/10/2006  . Zoster Recombinat (Shingrix) 12/11/2018, 05/03/2019     Review of systems: Positive Findings in bold print.  Constitutional:  chills, fatigue, fever, sweats, weight change Communication: Optometrist, sign Ecologist, hand writing, iPad/Android device Head: head injury Eyes: changes in vision, eye pain, glaucoma, cataracts, macular degeneration, diplopia, glare, light sensitivity, eyeglasses or contacts, blindness Ears nose mouth throat: hearing impaired, hearing aids,  ringing in ears, deaf, sign language,  vertigo, nosebleeds,  rhinitis,  cold sores, snoring, swollen glands Cardiovascular: HTN, edema, arrhythmia, pacemaker in place, defibrillator in place, chest pain/tightness, chronic anticoagulation, blood clot, heart failure, MI Peripheral  Vascular: leg cramps, varicose veins, blood clots, lymphedema, varicosities Respiratory:  asthma, difficulty breathing, denies congestion, SOB, wheezing, cough, emphysema, oxygen dependent Gastrointestinal: change in appetite or weight, abdominal pain, constipation, diarrhea, nausea, vomiting, vomiting blood, change in bowel habits, abdominal pain, jaundice, rectal bleeding, hemorrhoids, GERD Genitourinary:  nocturia,  pain on urination, polyuria,  blood in urine, Foley catheter, urinary urgency, ESRD on hemodialysis Musculoskeletal: amputation(s), cramping, stiff joints, painful joints, decreased joint motion, fractures, OA, gout, hemiplegia, paraplegia, uses cane, wheelchair bound, uses walker, uses rollator Skin: changes in toenails, color change, dryness, itching, mole changes,  rash, wound(s) Neurological: headaches, numbness in feet, paresthesias in feet, burning in feet, fainting,  seizures, change in speech. denies headaches, memory problems/poor historian, cerebral palsy, weakness, paralysis, CVA, TIA, tremors Endocrine: diabetes, hypothyroidism, hyperthyroidism,  goiter, dry mouth, flushing, heat intolerance,  cold intolerance,  excessive thirst, denies polyuria,  nocturia Hematological:  easy bleeding, excessive bleeding, easy bruising, enlarged lymph nodes, on long term blood thinner, history of past transusions Allergy/immunological:  hives, eczema, frequent infections, multiple drug allergies, seasonal allergies, transplant recipient, multiple food allergies Psychiatric:  anxiety, depression, mood disorder, suicidal ideations, hallucinations, insomnia   Objective:  Physical Exam: There were no vitals filed for this visit.   OSRIC KLOPF is a pleasant 79 y.o. Caucasian male in NAD. AAO x 3.  Vascular Examination: Neurovascular status unchanged b/l lower extremities. Capillary refill time to digits immediate b/l. Palpable pedal pulses b/l LE. Pedal hair absent. Lower extremity skin  temperature gradient within normal limits. No pain with calf compression b/l. No edema noted b/l lower extremities.  Dermatological Examination: Pedal skin with normal turgor, texture and tone bilaterally. No open wounds bilaterally. No interdigital macerations bilaterally. Toenails 1-5 b/l elongated, discolored, dystrophic, thickened, crumbly with subungual debris and tenderness to dorsal palpation.    Wound Location: submet head 1 right foot There is a minimal amount of devitalized tissue present in the wound. Predebridement Wound Measurement:  0.5  x 0.3 x 0cm. Postdebridement Wound Measurement: 0.5 x 0.3 x 0cm. Wound Base: epithelialized Peri-wound: Normal Exudate: None: wound tissue dry Blood Loss during debridement: 0 cc('s). Material in wound which inhibits healing/promotes adjacent tissue breakdown: N/A Description of tissue removed from ulceration today:  nonviable hyperkeratosis. Sign(s) of clinical bacterial infection: no clinical signs of infection noted on examination today.  Musculoskeletal Examination: Normal muscle strength 5/5 to all lower extremity muscle groups bilaterally. No pain crepitus or joint limitation noted with ROM b/l. Hammertoes noted to the 1-5 bilaterally.  Neurological Examination: Protective sensation diminished with 10g monofilament b/l. Clonus negative b/l.  Hemoglobin A1C Latest Ref Rng & Units 02/25/2020 11/07/2019  HGBA1C 4.0 - 5.6 % 5.9(A) 5.9(A)  Some recent data might be hidden       No results for input(s): GRAMSTAIN, LABORGA in the last 8760 hours.   Lab Results  Component Value Date   WBC 4.7 04/08/2020   HGB 10.8 (L) 04/08/2020   HCT 33.9 (L) 04/08/2020   MCV 105.0 (H) 04/08/2020   PLT 120 (L) 04/08/2020     No results found.   Assessment:  No diagnosis found. Plan:  -Patient was evaluated and treated and all questions answered.  -Patient/POA/Family member educated on diagnosis and treatment plan of routine ulcer  debridement/wound care.  -Ulceration debridement achieved utilizing sharp excisional debridement with sterile scalpel blade.  Type/amount of devitalized tissue removed: devitalized hyperkeratosis -Today's ulcer size post-debridement: 0.5 x 0.3 x 0 cm. -Ulceration cleansed with wound cleanser. triple antibiotic ointment applied to base of ulceration and secured with light dressing. -Wound responded well to today's debridement. -Patient risk factors affecting healing of ulcer: diabetes, diabetic neuropathy, debility -[name] given written instructions on daily wound care for submet head 1 right foot ulceration. -Patient to report any pedal injuries to medical professional immediately. -Patient may start showering. -Patient may wear his diabetic shoe on his right foot. Insert was modified on last visit. He was also measured/casted for new diabetic shoes/insoles. -Patient/POA to call should there be question/concern in the interim.  Return in about 4 weeks (around 05/13/2020) for diabetic ulcer.  Marzetta Board, DPM

## 2020-04-20 DIAGNOSIS — I509 Heart failure, unspecified: Secondary | ICD-10-CM | POA: Diagnosis not present

## 2020-04-20 DIAGNOSIS — C220 Liver cell carcinoma: Secondary | ICD-10-CM | POA: Diagnosis not present

## 2020-04-20 DIAGNOSIS — I2510 Atherosclerotic heart disease of native coronary artery without angina pectoris: Secondary | ICD-10-CM | POA: Diagnosis not present

## 2020-04-23 ENCOUNTER — Telehealth: Payer: Self-pay

## 2020-04-23 ENCOUNTER — Other Ambulatory Visit: Payer: Self-pay

## 2020-04-23 DIAGNOSIS — C220 Liver cell carcinoma: Secondary | ICD-10-CM

## 2020-04-24 ENCOUNTER — Other Ambulatory Visit: Payer: Self-pay

## 2020-04-24 ENCOUNTER — Other Ambulatory Visit: Payer: Self-pay | Admitting: Hematology

## 2020-04-24 DIAGNOSIS — C220 Liver cell carcinoma: Secondary | ICD-10-CM

## 2020-04-24 DIAGNOSIS — J9611 Chronic respiratory failure with hypoxia: Secondary | ICD-10-CM | POA: Diagnosis not present

## 2020-04-24 DIAGNOSIS — J449 Chronic obstructive pulmonary disease, unspecified: Secondary | ICD-10-CM | POA: Diagnosis not present

## 2020-04-24 NOTE — Progress Notes (Unsigned)
IR evaluation and mX order

## 2020-04-24 NOTE — Telephone Encounter (Signed)
Order review 

## 2020-04-27 ENCOUNTER — Telehealth: Payer: Medicare HMO

## 2020-04-28 ENCOUNTER — Telehealth: Payer: Medicare HMO

## 2020-04-28 ENCOUNTER — Other Ambulatory Visit: Payer: Self-pay | Admitting: Hematology

## 2020-04-28 ENCOUNTER — Ambulatory Visit
Admission: RE | Admit: 2020-04-28 | Discharge: 2020-04-28 | Disposition: A | Discharge status: Home / Self Care | Payer: Medicare HMO | Source: Ambulatory Visit | Attending: Hematology | Admitting: Hematology

## 2020-04-28 ENCOUNTER — Other Ambulatory Visit: Payer: Self-pay

## 2020-04-28 ENCOUNTER — Encounter: Payer: Self-pay | Admitting: *Deleted

## 2020-04-28 DIAGNOSIS — C220 Liver cell carcinoma: Secondary | ICD-10-CM

## 2020-04-28 HISTORY — PX: IR RADIOLOGIST EVAL & MGMT: IMG5224

## 2020-04-28 NOTE — Consult Note (Signed)
Chief Complaint: Hepatocellular carcinoma  Referring Physician(s): Kale,Gautam Kishore  History of Present Illness: MARDY LUCIER is a 79 y.o. 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    The indeterminate liver lesion was then further evaluated with abdominal MRI performed 03/30/2020 which confirmed the presence of an approximately 3.7 x 2.6 cm lesion within the dome of the medial segment of the left lobe of the liver which demonstrated imaging characteristics diagnostic of a hepatocellular carcinoma.  (Note, an additional question punctate lesion within the right lobe of the liver was not definitively identified abdominal MRI and is likely too small for further characterization at this time.)  Given patient's multiple medical core morbidities he is not felt to be a surgical candidate and as such is seen today in telemedicine consultation for evaluation of this incidentally discovered hepatocellular carcinoma.  Patient remains asymptomatic in regards to this initially discovered liver lesion.  Specifically, no change in appetite or energy level.  No abdominal pain.  No yellowing of the skin or eyes.  No bloody or melanotic stools.  No increased abdominal girth.  From a functional standpoint, the patient is able to ambulate with an assistive device though admits he is unable to walk long distances secondary to shortness of breath.  He lives at home with his wife who he relies on for  assistance with ADLs.    Past Medical History:  Diagnosis Date   Benign prostatic hypertrophy    COPD (chronic obstructive pulmonary disease) ([place]) December 15, 2009   FEV1 2.30 (70%) ratio 63 no better with B2 and DLCO 18/6 (73%) corrects to 106%   Degenerative joint disease    Depression    Diverticulosis    Essential hypertension    ACE inhibitor cough   Fatty liver    Hyperlipidemia    Internal hemorrhoids    Low back pain    Otitis externa 07/30/2012   Splenomegaly    Type 2 diabetes mellitus ([place])    Ulcer of foot ([place])    Right foot    Past Surgical History:  Procedure Laterality Date   NO PAST SURGERIES  1980    Allergies: Patient has no known allergies.  Medications: Prior to Admission medications   Medication Sig Start Date End Date Taking? Authorizing Provider  acyclovir (ZOVIRAX) 400 MG tablet TAKE 1 TABLET TWICE DAILY 01/13/20   [name], MD  albuterol (VENTOLIN HFA) 108 (90 Base) MCG/ACT inhaler Inhale 2 puffs into the lungs every 6 (six) hours as needed for wheezing or shortness of breath. 11/29/19   [name], MD  amitriptyline (ELAVIL) 50 MG tablet TAKE 1 TABLET AT BEDTIME 03/05/20   [name], MD  ASCORBIC ACID PO Take 1 tablet by mouth daily.     [provider]  aspirin EC 81 MG tablet Take 81 mg by mouth daily.    [provider]  Blood Glucose Monitoring Suppl (TRUE METRIX AIR GLUCOSE METER) w/Device KIT  08/01/18   [provider]  busPIRone (BUSPAR) 5 MG tablet Take  1 tablet (5 mg total) by mouth 2 (two) times daily as needed (anxiety). 02/25/20   [name], MD  carvedilol (COREG) 6.25 MG tablet Take 1 tablet (6.25 mg total) by mouth 2 (two) times daily. 01/28/20   Strader, Fransisco Hertz, PA-C  Cholecalciferol 1000 UNITS capsule Take 1 capsule (1,000 Units total) by mouth 2 (two) times daily. 05/19/11   Parrett, Fonnie Mu, NP  clotrimazole-betamethasone (LOTRISONE) cream Apply 1 application  topically 2 (two) times daily. For 7 days maximum for severe jock itch . Stop if worsening symptoms. 11/07/19   [name], MD  escitalopram (LEXAPRO) 5 MG tablet Take 1 tablet (5 mg total) by mouth daily. 01/07/20   [name], MD  famotidine (PEPCID) 20 MG tablet TAKE 1 TABLET TWICE DAILY 03/05/20   [name], MD  finasteride (PROSCAR) 5 MG tablet TAKE 1 TABLET EVERY DAY 03/05/20   [name], MD  furosemide (LASIX) 20 MG tablet Take 1 tablet (20 mg total) by mouth daily. 01/16/20 03/06/20  [name], MD  gabapentin (NEURONTIN) 300 MG capsule Take 1 capsule (300 mg total) by mouth 2 (two) times daily. 12/12/19   [name], MD  glucose blood (TRUE METRIX BLOOD GLUCOSE TEST) test strip TEST BLOOD SUGAR EVERY DAY 11/05/19   [name], MD  Multiple Vitamin (MULTIVITAMIN) capsule Take 1 capsule by mouth daily.      [provider]  mupirocin ointment (BACTROBAN) 2 % Apply to right foot ulcer once daily and cover with dressing. 02/18/20   Marzetta Board, DPM  NONFORMULARY OR COMPOUNDED ITEM [facility]  Peripheral Neuropathy Cream- Bupivacaine 1%, Doxepin 3%, Gabapentin 6%, Pentoxifylline 3%, Topiramate 1% Apply 1-2 grams to affected area 3-4 times daily Qty. 120 gm 3 refills 02/20/17   [provider]  omeprazole (PRILOSEC) 20 MG capsule TAKE 1 CAPSULE DAILY 30 TO 60 MINUTES BEFORE FIRST MEAL OF THE DAY 03/05/20   [name], MD  rosuvastatin (CRESTOR) 10 MG tablet Take 1 tablet (10 mg total) by mouth daily. 01/07/20   [name], MD  sacubitril-valsartan (ENTRESTO) 49-51 MG Take 1 tablet by mouth 2 (two) times daily. 04/03/20   Strader, Fransisco Hertz, PA-C  sodium chloride (MURO 128) 5 % ophthalmic solution Place 1 drop into the left eye at bedtime.    [provider]  tamsulosin (FLOMAX) 0.4 MG CAPS capsule TAKE 1 CAPSULE EVERY DAY 03/05/20   [name], MD  TRUEPLUS LANCETS 28G MISC Use to test blood  sugars two times daily. Dx: E11.9 07/30/18   [name], MD     Family History  Problem Relation Age of Onset   Melanoma Mother    Stroke Father    CAD Brother        Premature disease    Social History   Socioeconomic History   Marital status: Married    Spouse name: Not on file   Number of children: Not on file   Years of education: Not on file   Highest education level: Not on file  Occupational History   Occupation: retired    Comment: maintenence work  Tobacco Use   Smoking status: Former Smoker    Packs/day: 4.00    Years: 20.00    Pack years: 80.00    Types: Cigarettes    Quit date: 09/18/1984    Years since quitting: 35.6   Smokeless tobacco: Never Used   Tobacco comment: quit in 1980  Vaping Use   Vaping Use: Never used  Substance and Sexual Activity   Alcohol use: No    Comment: no drinking since 30 years plus   Drug use: No   Sexual activity: Yes  Other Topics Concern   Not on file  Social History Narrative   Married 53 years in 2015. 4 kids (2 sons) and 3 grandkids.    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            Social Determinants of Radio broadcast assistant Strain:    Difficulty of Paying Living Expenses:   Food Insecurity:    Worried About Charity fundraiser in the Last Year:    Arboriculturist in the Last Year:   Transportation Needs:    Film/video editor (Medical):    Lack of Transportation (Non-Medical):   Physical Activity:    Days of Exercise per Week:    Minutes of Exercise per Session:   Stress:    Feeling of Stress :   Social Connections:    Frequency of Communication with Friends and Family:    Frequency of Social Gatherings with Friends and Family:    Attends Religious Services:    Active Member of Clubs or Organizations:    Attends Music therapist:    Marital Status:       ECOG Status: 2 - Symptomatic, <50% confined to bed  Review of Systems  Review of Systems: A 12 point ROS discussed and pertinent positives are indicated in the HPI above.  All other systems are negative.  Physical Exam No direct physical exam was performed (except for noted visual exam findings with Video Visits).   Vital Signs: There were no vitals taken for this visit.  Imaging:  Abdominal MRI - 03/30/2020; CT abdomen and pelvis - 02/14/2020; chest CT - 12/03/2019  Personal review of abdominal MRI performed 03/30/2020 demonstrates an approximately 3.6 cm heterogeneously enhancing mass involving the subcapsular aspect of the medial segment of the left lobe of the liver.  The question of punctate (approximately 0.8 cm) hyperenhancing lesion within the right lobe of the liver is not definitively seen on the abdominal is too small for accurate characterization at this time.  Redemonstrated nodularity hepatic contour with mild splenomegaly.  The portal vein remains patent.  Note is made of a trace amount of perihepatic ascites and a small left-sided pleural effusion.  Labs:  CBC: Recent Labs    12/04/19 0312 12/13/19 1336 01/22/20 1412 04/08/20 1447  WBC 8.6 5.9 11.8* 4.7  HGB 11.6* 11.2* 10.8* 10.8*  HCT 34.9* 32.5* 32.7* 33.9*  PLT 157 134.0* 101.0* 120*    COAGS: Recent Labs    12/04/19 0312 04/08/20 1446  INR 1.1 1.1    BMP: Recent Labs    12/08/19 0311 12/13/19 1336 01/15/20 0000 01/22/20 1412 02/05/20 1404 03/17/20 1335 04/08/20 1447  NA 139   < > 143 141 141 141 143  K 4.6   < > 4.9 4.2 4.6 4.4 4.5  CL 105   < > 106 102 105 105 108  CO2 25   < > 25* '27 29 26 27  ' GLUCOSE 122*   < >  --  146* 151* 128* 159*  BUN 30*   < > 17 29* '17 14 12  ' CALCIUM 8.9   < >  9.0 8.6 8.7 9.0 8.8*  CREATININE 1.38*   < > 1.4* 1.52* 1.07 0.93 1.14  GFRNONAA 49*  --  50  --   --  >60 >60  GFRAA 56*  --  58  --   --  >60 >60   < > = values in this interval not displayed.     LIVER FUNCTION TESTS: Recent Labs    12/04/19 0312 12/13/19 1336 01/22/20 1412 04/08/20 1447  BILITOT 1.0   0.9 0.4 0.7 0.3  AST '27   26 25 19 22  ' ALT '15   15 25 10 10  ' ALKPHOS 80   79 99 80 103  PROT 6.6   6.3* 6.4 6.0 6.5  ALBUMIN 3.4*   3.4* 3.6 3.7 3.4*    TUMOR MARKERS: No results for input(s): AFPTM, CEA, CA199, CHROMGRNA in the last 8760 hours.  Assessment and Plan:  BRANDONLEE NAVIS is a 79 y.o. 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  He remains asymptomatic in regards to this incidentally discovered liver lesion.  From a functional standpoint, he is estimated as an ECOG 2.  Given patient's multiple medical comorbidities, he is not felt to be a surgical candidate.  Personal review of abdominal MRI demonstrates a heterogeneously enhancing mass within the dome of the medial segment of the left lobe of the liver measuring approximately 3.7 x 3.6 x 2.5 cm in diameter.  (note, the questioned punctate (approximately 0.8 cm) hyperenhancing lesion within the right lobe of the liver is not definitively seen on the abdominal MRI and is too small for accurate characterization at this time.)  Cirrhotic protocol CT scan demonstrates a moderate to large amount of prominently calcified slightly irregular atherosclerotic plaque throughout the abdominal aorta.  Specifically, there is a moderate amount of focal eccentric calcified atherosclerotic plaque at the level of the aortic bifurcation which approaches 50% luminal narrowing of the bilateral common iliac arteries. the portal vein remains patent.    Trace amount of perihepatic ascites is noted  on the abdominal MRI however the patient's bilirubin level is within normal limits.  Patient's creatinine is also within normal limits.  AFP level (ordered by Dr. Irene Limbo), is not available at the time of this consultation.  Potential treatment options were discussed at length with the patient including the following:  - Surgical resection - Given the patient's medical morbidities, he is likely not an operative candidate, however definitive surgical consultation was offered though the patient declined.  - Continued observation - I explained that oftentimes incidentally discovered liver lesions demonstrated indolent growth pattern however in the absence of previous examinations, a definitive growth rate is uncertain.  I also explained that given his medical comorbidities, this lesion may indeed proved to be noncontributory to his overall life expectancy.  - Image guided microwave ablation - I explained that while the solitary lesion is in an adequate location for potential ablation (located within the periphery of the medial segment of the left lobe of the liver) its size of greater than 3 cm increases the possibility of residual disease.  Additionally, given his medical comorbidities, ablation procedures require general and is nausea which is not of insignificant consequence given patient's medical comorbidities.  -  Transcatheter arterial treatments, specifically, bland embolization -  I explained that bland embolization is performed not with curative intent, however in hopes of achieving disease reduction and/or stability for a length of time, typically 3-6 months.  Additionally I explained that if successful, with lesion size reduction to less than 3 cm, the patient may ultimately may be a candidate for percutaneous ablation, a modality performed with curative intent.    Risks and benefits of bland hepatic embolization was discussed with the patient including, but not limited to bleeding, infection,  vascular injury or contrast induced renal failure. I explained that embolization is performed at [facility] and entails an overnight admission for continued observation and PCA usage.  Following this prolonged and detailed conversation, the patient wishes to pursue bland embolization.  Plan: - Clinical cytogeneticist and schedule image guided bland embolization at [facility].  The procedure will entail an overnight admission for continued observation and PCA usage. - Note, will ensure a pre procedural AFP is documented, otherwise would repeat AFP level on the day of the procedure AFP. - Continued surveillance of questioned punctate (approximate 0.8 cm) hyperenhancing lesion within the right lobe of the liver on postprocedural surveillance abdominal MRI performed 3 months following the planned embolization of the dominant lesion within the left lobe of the liver.  Thank you for this interesting consult.  I greatly enjoyed meeting PENN GRISSETT and look forward to participating in their care.  A copy of this report was sent to the requesting provider on this date.  Electronically Signed: Sandi Mariscal 04/28/2020, 10:22 AM   I spent a total of 30 Minutes in remote  clinical consultation, greater than 50% of which was counseling/coordinating care for newly diagnosed hepatocellular carcinoma.    Visit type: Audio only (telephone). Audio (no video) only due to patient's lack of internet/smartphone capability. Alternative for in-person consultation at [facility], [address], [place], [place]. This visit type was conducted due to national recommendations for restrictions regarding the COVID-19 Pandemic (e.g. social distancing).  This format is felt to be most appropriate for this patient at this time.  All issues noted in this document were discussed and addressed.

## 2020-05-01 ENCOUNTER — Other Ambulatory Visit: Payer: Self-pay | Admitting: *Deleted

## 2020-05-01 ENCOUNTER — Other Ambulatory Visit: Payer: Self-pay

## 2020-05-01 ENCOUNTER — Ambulatory Visit (INDEPENDENT_AMBULATORY_CARE_PROVIDER_SITE_OTHER): Payer: Medicare HMO | Admitting: Orthotics

## 2020-05-01 DIAGNOSIS — E08621 Diabetes mellitus due to underlying condition with foot ulcer: Secondary | ICD-10-CM

## 2020-05-01 DIAGNOSIS — M79675 Pain in left toe(s): Secondary | ICD-10-CM | POA: Diagnosis not present

## 2020-05-01 DIAGNOSIS — L97412 Non-pressure chronic ulcer of right heel and midfoot with fat layer exposed: Secondary | ICD-10-CM

## 2020-05-01 DIAGNOSIS — M79671 Pain in right foot: Secondary | ICD-10-CM

## 2020-05-01 DIAGNOSIS — Z8631 Personal history of diabetic foot ulcer: Secondary | ICD-10-CM

## 2020-05-01 DIAGNOSIS — C220 Liver cell carcinoma: Secondary | ICD-10-CM

## 2020-05-01 NOTE — Progress Notes (Signed)

## 2020-05-04 ENCOUNTER — Other Ambulatory Visit (HOSPITAL_COMMUNITY): Payer: Self-pay | Admitting: Interventional Radiology

## 2020-05-04 DIAGNOSIS — C220 Liver cell carcinoma: Secondary | ICD-10-CM

## 2020-05-06 ENCOUNTER — Telehealth: Payer: Self-pay | Admitting: Family Medicine

## 2020-05-06 NOTE — Progress Notes (Signed)
  Chronic Care Management   Note  05/06/2020 Name: [name] MRN: [id number] DOB: 12-02-1940  [name] is a 79 y.o. year old male who is a primary care patient of [name], MD. I reached out to [name] by phone today in response to a referral sent by [name]'s PCP, [name], MD.   [name] was given information about Chronic Care Management services today including:  1. CCM service includes personalized support from designated clinical staff supervised by his physician, including individualized plan of care and coordination with other care providers 2. 24/7 contact phone numbers for assistance for urgent and routine care needs. 3. Service will only be billed when office clinical staff spend 20 minutes or more in a month to coordinate care. 4. Only one practitioner may furnish and bill the service in a calendar month. 5. The patient may stop CCM services at any time (effective at the end of the month) by phone call to the office staff.   Patient agreed to services and verbal consent obtained.   Follow up plan:   Earney Hamburg Upstream Scheduler

## 2020-05-08 ENCOUNTER — Other Ambulatory Visit (HOSPITAL_COMMUNITY)
Admission: RE | Admit: 2020-05-08 | Discharge: 2020-05-08 | Disposition: A | Discharge status: Home / Self Care | Payer: Medicare HMO | Source: Ambulatory Visit | Attending: Interventional Radiology | Admitting: Interventional Radiology

## 2020-05-08 ENCOUNTER — Other Ambulatory Visit: Payer: Self-pay

## 2020-05-08 DIAGNOSIS — C220 Liver cell carcinoma: Secondary | ICD-10-CM | POA: Insufficient documentation

## 2020-05-09 LAB — AFP TUMOR MARKER: AFP, Serum, Tumor Marker: 4.3 ng/mL (ref 0.0–8.3)

## 2020-05-15 ENCOUNTER — Ambulatory Visit: Payer: Medicare HMO | Admitting: Podiatry

## 2020-05-15 ENCOUNTER — Encounter: Payer: Self-pay | Admitting: Podiatry

## 2020-05-15 ENCOUNTER — Other Ambulatory Visit: Payer: Self-pay

## 2020-05-15 DIAGNOSIS — B353 Tinea pedis: Secondary | ICD-10-CM

## 2020-05-15 DIAGNOSIS — L97412 Non-pressure chronic ulcer of right heel and midfoot with fat layer exposed: Secondary | ICD-10-CM

## 2020-05-15 DIAGNOSIS — E1142 Type 2 diabetes mellitus with diabetic polyneuropathy: Secondary | ICD-10-CM | POA: Diagnosis not present

## 2020-05-15 DIAGNOSIS — B351 Tinea unguium: Secondary | ICD-10-CM

## 2020-05-15 DIAGNOSIS — M79674 Pain in right toe(s): Secondary | ICD-10-CM | POA: Diagnosis not present

## 2020-05-15 DIAGNOSIS — M79675 Pain in left toe(s): Secondary | ICD-10-CM | POA: Diagnosis not present

## 2020-05-15 DIAGNOSIS — E08621 Diabetes mellitus due to underlying condition with foot ulcer: Secondary | ICD-10-CM

## 2020-05-15 MED ORDER — CLOTRIMAZOLE 1 % EX CREA: TOPICAL_CREAM | CUTANEOUS | 1 refills | Status: DC

## 2020-05-16 NOTE — Progress Notes (Signed)
Subjective:  Patient ID: [name], male    DOB: 02/03/1941,  MRN: [id number]  79 y.o. male is seen for follow up ulcer submet head 1 right foot.  Prescribed wound care regimen: Mupirocin Ointment dressing daily.  Patient is wearing diabetic shoes with new offloaded insoles.  Patient relates wound has improved and he feels no discomfort  Patient The patient is having no constitutional symptoms, denying fever, chills, anorexia, or weight loss.  He states he has seen his oncologist and a plan has been established for procedure to address his liver tumor. He states he feels much better now that there is a treatment plan.  Past Medical History:  Diagnosis Date  . Benign prostatic hypertrophy   . COPD (chronic obstructive pulmonary disease) ([place]) December 15, 2009   FEV1 2.30 (70%) ratio 63 no better with B2 and DLCO 18/6 (73%) corrects to 106%  . Degenerative joint disease   . Depression   . Diverticulosis   . Essential hypertension    ACE inhibitor cough  . Fatty liver   . Hyperlipidemia   . Internal hemorrhoids   . Low back pain   . Otitis externa 07/30/2012  . Splenomegaly   . Type 2 diabetes mellitus ([place])   . Ulcer of foot ([place])    Right foot     Past Surgical History:  Procedure Laterality Date  . IR RADIOLOGIST EVAL & MGMT  04/28/2020  . NO PAST SURGERIES  1980     Current Outpatient Medications on File Prior to Visit  Medication Sig Dispense Refill  . acyclovir (ZOVIRAX) 400 MG tablet TAKE 1 TABLET TWICE DAILY 180 tablet 1  . albuterol (VENTOLIN HFA) 108 (90 Base) MCG/ACT inhaler Inhale 2 puffs into the lungs every 6 (six) hours as needed for wheezing or shortness of breath. 6.7 g 3  . amitriptyline (ELAVIL) 50 MG tablet TAKE 1 TABLET AT BEDTIME 90 tablet 1  . ASCORBIC ACID PO Take 1 tablet by mouth daily.     Marland Kitchen aspirin EC 81 MG tablet Take 81 mg by mouth daily.    . Blood Glucose Monitoring Suppl (TRUE METRIX AIR GLUCOSE METER) w/Device KIT     . busPIRone  (BUSPAR) 5 MG tablet Take 1 tablet (5 mg total) by mouth 2 (two) times daily as needed (anxiety). 60 tablet 5  . carvedilol (COREG) 6.25 MG tablet Take 1 tablet (6.25 mg total) by mouth 2 (two) times daily. 180 tablet 3  . Cholecalciferol 1000 UNITS capsule Take 1 capsule (1,000 Units total) by mouth 2 (two) times daily.    . clotrimazole-betamethasone (LOTRISONE) cream Apply 1 application topically 2 (two) times daily. For 7 days maximum for severe jock itch . Stop if worsening symptoms. 45 g 1  . escitalopram (LEXAPRO) 5 MG tablet Take 1 tablet (5 mg total) by mouth daily. 90 tablet 5  . famotidine (PEPCID) 20 MG tablet TAKE 1 TABLET TWICE DAILY 180 tablet 1  . finasteride (PROSCAR) 5 MG tablet TAKE 1 TABLET EVERY DAY 90 tablet 1  . furosemide (LASIX) 20 MG tablet Take 1 tablet (20 mg total) by mouth daily. 90 tablet 1  . gabapentin (NEURONTIN) 300 MG capsule Take 1 capsule (300 mg total) by mouth 2 (two) times daily. 180 capsule 3  . glucose blood (TRUE METRIX BLOOD GLUCOSE TEST) test strip TEST BLOOD SUGAR EVERY DAY 100 strip 1  . Multiple Vitamin (MULTIVITAMIN) capsule Take 1 capsule by mouth daily.      . mupirocin  ointment (BACTROBAN) 2 % Apply to right foot ulcer once daily and cover with dressing. 30 g 1  . NONFORMULARY OR COMPOUNDED ITEM [facility]  Peripheral Neuropathy Cream- Bupivacaine 1%, Doxepin 3%, Gabapentin 6%, Pentoxifylline 3%, Topiramate 1% Apply 1-2 grams to affected area 3-4 times daily Qty. 120 gm 3 refills    . omeprazole (PRILOSEC) 20 MG capsule TAKE 1 CAPSULE DAILY 30 TO 60 MINUTES BEFORE FIRST MEAL OF THE DAY 90 capsule 1  . rosuvastatin (CRESTOR) 10 MG tablet Take 1 tablet (10 mg total) by mouth daily. 90 tablet 3  . sacubitril-valsartan (ENTRESTO) 49-51 MG Take 1 tablet by mouth 2 (two) times daily. 180 tablet 3  . sodium chloride (MURO 128) 5 % ophthalmic solution Place 1 drop into the left eye at bedtime.    . tamsulosin (FLOMAX) 0.4 MG CAPS capsule TAKE  1 CAPSULE EVERY DAY 90 capsule 1  . TRUEPLUS LANCETS 28G MISC Use to test blood sugars two times daily. Dx: E11.9 200 each 3   No current facility-administered medications on file prior to visit.     No Known Allergies   Family History  Problem Relation Age of Onset  . Melanoma Mother   . Stroke Father   . CAD Brother        Premature disease     Social History   Occupational History  . Occupation: retired    Comment: maintenence work  Tobacco Use  . Smoking status: Former Smoker    Packs/day: 4.00    Years: 20.00    Pack years: 80.00    Types: Cigarettes    Quit date: 09/18/1984    Years since quitting: 35.6  . Smokeless tobacco: Never Used  . Tobacco comment: quit in 1980  Vaping Use  . Vaping Use: Never used  Substance and Sexual Activity  . 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 Drug use: No  . Sexual activity: Yes     Immunization History  Administered Date(s) Administered  . Influenza Split 09/07/2011, 07/30/2012  . Influenza Whole 10/10/2005, 08/31/2009, 07/10/2010  . Influenza, High Dose Seasonal PF 06/22/2016, 06/29/2017, 09/20/2018, 07/11/2019  . Influenza,inj,Quad PF,6+ Mos 07/01/2013, 06/20/2014  . Influenza-Unspecified 10/26/2015, 09/12/2018  . Meningococcal Conjugate 07/24/2014  . Moderna SARS-COVID-2 Vaccination 10/22/2019, 12/02/2019  . Pneumococcal Conjugate-13 07/24/2014  . Pneumococcal Polysaccharide-23 08/31/2009  . Td 06/10/2006  . Zoster Recombinat (Shingrix) 12/11/2018, 05/03/2019   Objective:  Physical Exam: There were no vitals filed for this visit.   [name] is a pleasant 79 y.o. Caucasian male in NAD. AAO x 3.  Vascular Examination: Neurovascular status unchanged b/l lower extremities. Capillary refill time to digits immediate b/l. Palpable pedal pulses b/l LE. Pedal hair absent. Lower extremity skin temperature gradient within normal limits. No  pain with calf compression b/l. No edema noted b/l  lower extremities.   Dermatological Examination: Pedal skin with normal turgor, texture and tone bilaterally. No open wounds bilaterally. No interdigital macerations bilaterally. Toenails 1-5 b/l elongated, discolored, dystrophic, thickened, crumbly with subungual debris and tenderness to dorsal palpation. Diffuse scaling noted peripherally and plantarly b/l feet..  No interdigital macerations.  No blisters, no weeping. No signs of secondary bacterial infection noted.     Wound Location: submet head 1 right foot There is a minimal amount of devitalized tissue present in the wound. Predebridement Wound Measurement:  1.0 x 0.5 x 0 cm. Postdebridement Wound Measurement: 1.0 1.0 x 0 cm and is  partial thickness Wound Base: epithelialized Peri-wound: Normal Exudate: None: wound tissue dry Blood Loss during debridement: 0 cc('s). Material in wound which inhibits healing/promotes adjacent tissue breakdown: N/A Description of tissue removed from ulceration today:  nonviable hyperkeratosis. Sign(s) of clinical bacterial infection: no clinical signs of infection noted on examination today.  Musculoskeletal Examination: Normal muscle strength 5/5 to all lower extremity muscle groups bilaterally. No pain crepitus or joint limitation noted with ROM b/l. Hammertoes noted to the 1-5 bilaterally.  Neurological Examination: Protective sensation diminished with 10g monofilament b/l. Clonus negative b/l.  Hemoglobin A1C Latest Ref Rng & Units 02/25/2020 11/07/2019  HGBA1C 4.0 - 5.6 % 5.9(A) 5.9(A)  Some recent data might be hidden    Lab Results  Component Value Date   WBC 4.7 04/08/2020   HGB 10.8 (L) 04/08/2020   HCT 33.9 (L) 04/08/2020   MCV 105.0 (H) 04/08/2020   PLT 120 (L) 04/08/2020    Assessment:   1. Pain due to onychomycosis of toenails of both feet   2. Tinea pedis of both feet   3. Diabetic ulcer of right midfoot associated with diabetes mellitus due to underlying condition, with fat  layer exposed ([place])   4. Diabetic peripheral neuropathy associated with type 2 diabetes mellitus ([place])    Plan:  -Patient was evaluated and treated and all questions answered.  -Patient/POA/Family member educated on diagnosis and treatment plan of routine ulcer debridement/wound care.  -Ulceration debridement achieved utilizing sharp excisional debridement with sterile scalpel blade.  Type/amount of devitalized tissue removed: devitalized hyperkeratosis -Today's ulcer size post-debridement: 1.0 x 1.0 cm and is partial thickness. -For tinea pedis, Rx for Clotrimazole Cream 1% to be applied to both feet and between toes bid x 4 weeks -Ulceration cleansed with wound cleanser. triple antibiotic ointment applied to base of ulceration and secured with light dressing. -Wound responded well to today's debridement. -Patient risk factors affecting healing of ulcer: diabetes, diabetic neuropathy, debility -[name] given instructions on daily wound care for submet head 1 right foot ulceration. -Patient to report any pedal injuries to medical professional immediately. -Patient may start showering. -Continue diabetic shoes daily. He has new offloaded insert for right foot.  -Patient/POA to call should there be question/concern in the interim.  Return in about 5 weeks (around 06/19/2020) for diabetic ulcer right.  Marzetta Board, DPM

## 2020-05-25 DIAGNOSIS — J9611 Chronic respiratory failure with hypoxia: Secondary | ICD-10-CM | POA: Diagnosis not present

## 2020-05-25 DIAGNOSIS — J449 Chronic obstructive pulmonary disease, unspecified: Secondary | ICD-10-CM | POA: Diagnosis not present

## 2020-05-29 ENCOUNTER — Other Ambulatory Visit: Payer: Self-pay | Admitting: Radiology

## 2020-05-29 ENCOUNTER — Other Ambulatory Visit: Payer: Self-pay | Admitting: Student

## 2020-06-01 ENCOUNTER — Observation Stay (HOSPITAL_COMMUNITY)
Admission: RE | Admit: 2020-06-01 | Discharge: 2020-06-01 | Disposition: A | Discharge status: Home / Self Care | Payer: Medicare HMO | Source: Ambulatory Visit | Attending: Interventional Radiology | Admitting: Interventional Radiology

## 2020-06-01 ENCOUNTER — Other Ambulatory Visit: Payer: Self-pay | Admitting: Radiology

## 2020-06-01 ENCOUNTER — Other Ambulatory Visit: Payer: Self-pay

## 2020-06-01 ENCOUNTER — Ambulatory Visit (HOSPITAL_COMMUNITY)
Admission: RE | Admit: 2020-06-01 | Discharge: 2020-06-01 | Disposition: A | Discharge status: Home / Self Care | Payer: Medicare HMO | Source: Ambulatory Visit | Attending: Interventional Radiology | Admitting: Interventional Radiology

## 2020-06-01 ENCOUNTER — Encounter (HOSPITAL_COMMUNITY): Payer: Self-pay

## 2020-06-01 DIAGNOSIS — Z8249 Family history of ischemic heart disease and other diseases of the circulatory system: Secondary | ICD-10-CM | POA: Diagnosis not present

## 2020-06-01 DIAGNOSIS — N400 Enlarged prostate without lower urinary tract symptoms: Secondary | ICD-10-CM | POA: Diagnosis not present

## 2020-06-01 DIAGNOSIS — F329 Major depressive disorder, single episode, unspecified: Secondary | ICD-10-CM | POA: Insufficient documentation

## 2020-06-01 DIAGNOSIS — J449 Chronic obstructive pulmonary disease, unspecified: Secondary | ICD-10-CM | POA: Insufficient documentation

## 2020-06-01 DIAGNOSIS — K760 Fatty (change of) liver, not elsewhere classified: Secondary | ICD-10-CM | POA: Diagnosis not present

## 2020-06-01 DIAGNOSIS — Z808 Family history of malignant neoplasm of other organs or systems: Secondary | ICD-10-CM | POA: Insufficient documentation

## 2020-06-01 DIAGNOSIS — F1011 Alcohol abuse, in remission: Secondary | ICD-10-CM | POA: Diagnosis not present

## 2020-06-01 DIAGNOSIS — Z8659 Personal history of other mental and behavioral disorders: Secondary | ICD-10-CM | POA: Diagnosis not present

## 2020-06-01 DIAGNOSIS — Z7982 Long term (current) use of aspirin: Secondary | ICD-10-CM | POA: Diagnosis not present

## 2020-06-01 DIAGNOSIS — Z9981 Dependence on supplemental oxygen: Secondary | ICD-10-CM | POA: Diagnosis not present

## 2020-06-01 DIAGNOSIS — I10 Essential (primary) hypertension: Secondary | ICD-10-CM | POA: Insufficient documentation

## 2020-06-01 DIAGNOSIS — I70203 Unspecified atherosclerosis of native arteries of extremities, bilateral legs: Secondary | ICD-10-CM | POA: Diagnosis not present

## 2020-06-01 DIAGNOSIS — E785 Hyperlipidemia, unspecified: Secondary | ICD-10-CM | POA: Diagnosis not present

## 2020-06-01 DIAGNOSIS — I708 Atherosclerosis of other arteries: Secondary | ICD-10-CM | POA: Diagnosis not present

## 2020-06-01 DIAGNOSIS — E1151 Type 2 diabetes mellitus with diabetic peripheral angiopathy without gangrene: Secondary | ICD-10-CM | POA: Diagnosis not present

## 2020-06-01 DIAGNOSIS — Z87891 Personal history of nicotine dependence: Secondary | ICD-10-CM | POA: Diagnosis not present

## 2020-06-01 DIAGNOSIS — Z79899 Other long term (current) drug therapy: Secondary | ICD-10-CM | POA: Insufficient documentation

## 2020-06-01 DIAGNOSIS — C220 Liver cell carcinoma: Secondary | ICD-10-CM | POA: Diagnosis not present

## 2020-06-01 DIAGNOSIS — I7092 Chronic total occlusion of artery of the extremities: Secondary | ICD-10-CM | POA: Diagnosis not present

## 2020-06-01 HISTORY — PX: IR EMBO TUMOR ORGAN ISCHEMIA INFARCT INC GUIDE ROADMAPPING: IMG5449

## 2020-06-01 LAB — COMPREHENSIVE METABOLIC PANEL
ALT: 23 U/L (ref 0–44)
AST: 37 U/L (ref 15–41)
Albumin: 3.9 g/dL (ref 3.5–5.0)
Alkaline Phosphatase: 81 U/L (ref 38–126)
Anion gap: 10 (ref 5–15)
BUN: 26 mg/dL — ABNORMAL HIGH (ref 8–23)
CO2: 24 mmol/L (ref 22–32)
Calcium: 9 mg/dL (ref 8.9–10.3)
Chloride: 105 mmol/L (ref 98–111)
Creatinine, Ser: 1.37 mg/dL — ABNORMAL HIGH (ref 0.61–1.24)
GFR calc Af Amer: 56 mL/min — ABNORMAL LOW (ref >60)
GFR calc non Af Amer: 49 mL/min — ABNORMAL LOW (ref >60)
Glucose, Bld: 126 mg/dL — ABNORMAL HIGH (ref 70–99)
Potassium: 4.6 mmol/L (ref 3.5–5.1)
Sodium: 139 mmol/L (ref 135–145)
Total Bilirubin: 0.4 mg/dL (ref 0.3–1.2)
Total Protein: 6.8 g/dL (ref 6.5–8.1)

## 2020-06-01 LAB — CBC
HCT: 34.6 % — ABNORMAL LOW (ref 39.0–52.0)
Hemoglobin: 11.6 g/dL — ABNORMAL LOW (ref 13.0–17.0)
MCH: 34.4 pg — ABNORMAL HIGH (ref 26.0–34.0)
MCHC: 33.5 g/dL (ref 30.0–36.0)
MCV: 102.7 fL — ABNORMAL HIGH (ref 80.0–100.0)
Platelets: 85 10*3/uL — ABNORMAL LOW (ref 150–400)
RBC: 3.37 MIL/uL — ABNORMAL LOW (ref 4.22–5.81)
RDW: 15.1 % (ref 11.5–15.5)
WBC: 4.5 10*3/uL (ref 4.0–10.5)
nRBC: 0 % (ref 0.0–0.2)

## 2020-06-01 LAB — PROTIME-INR
INR: 1.1 (ref 0.8–1.2)
Prothrombin Time: 13.5 seconds (ref 11.4–15.2)

## 2020-06-01 LAB — GLUCOSE, CAPILLARY: Glucose-Capillary: 109 mg/dL — ABNORMAL HIGH (ref 70–99)

## 2020-06-01 MED ORDER — PIPERACILLIN-TAZOBACTAM 3.375 G IVPB 30 MIN
3.3750 g | Freq: Once | INTRAVENOUS | Status: AC
  Filled 2020-06-01: qty 50

## 2020-06-01 MED ORDER — PANTOPRAZOLE SODIUM 40 MG IV SOLR
40.0000 mg | Freq: Once | INTRAVENOUS | Status: AC
  Filled 2020-06-01: qty 40

## 2020-06-01 MED ORDER — PANTOPRAZOLE SODIUM 40 MG IV SOLR
INTRAVENOUS | Status: AC
  Administered 2020-06-01: 40 mg via INTRAVENOUS
  Filled 2020-06-01: qty 40

## 2020-06-01 MED ORDER — FENTANYL CITRATE (PF) 100 MCG/2ML IJ SOLN
INTRAMUSCULAR | Status: AC
  Filled 2020-06-01: qty 4

## 2020-06-01 MED ORDER — IOHEXOL 300 MG/ML  SOLN: 100.0000 mL | Freq: Once | INTRAMUSCULAR | Status: DC | PRN

## 2020-06-01 MED ORDER — LIDOCAINE HCL (PF) 1 % IJ SOLN
INTRAMUSCULAR | Status: AC | PRN
  Administered 2020-06-01: 10 mL via INTRADERMAL

## 2020-06-01 MED ORDER — LIDOCAINE HCL 1 % IJ SOLN
INTRAMUSCULAR | Status: AC
  Filled 2020-06-01: qty 20

## 2020-06-01 MED ORDER — SODIUM CHLORIDE 0.9 % IV SOLN: INTRAVENOUS | Status: DC

## 2020-06-01 MED ORDER — PIPERACILLIN-TAZOBACTAM 3.375 G IVPB
INTRAVENOUS | Status: AC
  Administered 2020-06-01: 3.375 g via INTRAVENOUS
  Filled 2020-06-01: qty 50

## 2020-06-01 MED ORDER — MIDAZOLAM HCL 2 MG/2ML IJ SOLN
INTRAMUSCULAR | Status: AC | PRN
  Administered 2020-06-01 (×2): 1 mg via INTRAVENOUS

## 2020-06-01 MED ORDER — FENTANYL CITRATE (PF) 100 MCG/2ML IJ SOLN
INTRAMUSCULAR | Status: AC | PRN
  Administered 2020-06-01: 50 ug via INTRAVENOUS

## 2020-06-01 MED ORDER — SODIUM CHLORIDE 0.9 % IV SOLN
8.0000 mg | Freq: Once | INTRAVENOUS | Status: AC
  Administered 2020-06-01: 8 mg via INTRAVENOUS
  Filled 2020-06-01: qty 4

## 2020-06-01 MED ORDER — MIDAZOLAM HCL 2 MG/2ML IJ SOLN
INTRAMUSCULAR | Status: AC
  Filled 2020-06-01: qty 4

## 2020-06-01 NOTE — Consult Note (Signed)
Chief Complaint: Hepatocellular carcinoma segment 4 A. Request is for bland embolization.   Referring Physician(s): Dr. Carolyne Fiscal  Supervising Physician: Sandi Mariscal  Patient Status: [name] - In-pt  History of Present Illness: [name] is a 79 y.o. male Former heavy alcohol use,. History of COPD (on O2 at night), HLD, DM, HTN, Hepatocellular carcinoma. Found to have a hypervascular mass in segment 4 A while being worked up for left renal mass. CT CAP from 5.7.21 showed a  3.2 X 2 cm hypervascular mass  in segment 4 A of the left lobe.  MRI dated 6.21.21 reads the dominant lesion of concern in the medial segment of the left hepatic lobe (segment 4 A) is diagnostic for hepatocellular carcinoma.  Due to multiple co- morbidities the patient is not a candidate for surgical resection. The patient was seen via telehealth consult on 7.2.21 with Dr. Owens Shark, Interventional Radiology for consult. The patient was given several different treatment options and decided to pursue bland embolization. Patient presents for bland embolization segment 4 A of the liver.    Past Medical History:  Diagnosis Date  . Benign prostatic hypertrophy   . COPD (chronic obstructive pulmonary disease) ([place]) December 15, 2009   FEV1 2.30 (70%) ratio 63 no better with B2 and DLCO 18/6 (73%) corrects to 106%  . Degenerative joint disease   . Depression   . Diverticulosis   . Essential hypertension    ACE inhibitor cough  . Fatty liver   . Hyperlipidemia   . Internal hemorrhoids   . Low back pain   . Otitis externa 07/30/2012  . Splenomegaly   . Type 2 diabetes mellitus ([place])   . Ulcer of foot ([place])    Right foot    Past Surgical History:  Procedure Laterality Date  . IR RADIOLOGIST EVAL & MGMT  04/28/2020  . NO PAST SURGERIES  1980    Allergies: Patient has no known allergies.  Medications: Prior to Admission medications   Medication Sig Start Date End Date Taking? Authorizing Provider  acyclovir  (ZOVIRAX) 400 MG tablet TAKE 1 TABLET TWICE DAILY 01/13/20   [name], MD  albuterol (VENTOLIN HFA) 108 (90 Base) MCG/ACT inhaler Inhale 2 puffs into the lungs every 6 (six) hours as needed for wheezing or shortness of breath. 11/29/19   [name], MD  amitriptyline (ELAVIL) 50 MG tablet TAKE 1 TABLET AT BEDTIME 03/05/20   [name], MD  ASCORBIC ACID PO Take 1 tablet by mouth daily.     [provider]  aspirin EC 81 MG tablet Take 81 mg by mouth daily.    [provider]  Blood Glucose Monitoring Suppl (TRUE METRIX AIR GLUCOSE METER) w/Device KIT  08/01/18   [provider]  busPIRone (BUSPAR) 5 MG tablet Take 1 tablet (5 mg total) by mouth 2 (two) times daily as needed (anxiety). 02/25/20   [name], MD  carvedilol (COREG) 6.25 MG tablet Take 1 tablet (6.25 mg total) by mouth 2 (two) times daily. 01/28/20   Strader, Fransisco Hertz, PA-C  Cholecalciferol 1000 UNITS capsule Take 1 capsule (1,000 Units total) by mouth 2 (two) times daily. 05/19/11   Parrett, Fonnie Mu, NP  clotrimazole (LOTRIMIN) 1 % cream For athlete's feet, apply to both feet and between toes twice daily for 4 weeks. 05/15/20   Marzetta Board, DPM  clotrimazole-betamethasone (LOTRISONE) cream Apply 1 application topically 2 (two) times daily. For 7 days maximum for severe  jock itch . Stop if worsening symptoms. 11/07/19   [name], MD  escitalopram (LEXAPRO) 5 MG tablet Take 1 tablet (5 mg total) by mouth daily. 01/07/20   [name], MD  famotidine (PEPCID) 20 MG tablet TAKE 1 TABLET TWICE DAILY 03/05/20   [name], MD  finasteride (PROSCAR) 5 MG tablet TAKE 1 TABLET EVERY DAY 03/05/20   [name], MD  furosemide (LASIX) 20 MG tablet Take 1 tablet (20 mg total) by mouth daily. 01/16/20 03/06/20  [name], MD  gabapentin (NEURONTIN) 300 MG capsule Take 1 capsule (300 mg total) by mouth 2 (two) times daily. 12/12/19   [name], MD  glucose  blood (TRUE METRIX BLOOD GLUCOSE TEST) test strip TEST BLOOD SUGAR EVERY DAY 11/05/19   [name], MD  Multiple Vitamin (MULTIVITAMIN) capsule Take 1 capsule by mouth daily.      [provider]  mupirocin ointment (BACTROBAN) 2 % Apply to right foot ulcer once daily and cover with dressing. 02/18/20   Marzetta Board, DPM  NONFORMULARY OR COMPOUNDED ITEM [facility]  Peripheral Neuropathy Cream- Bupivacaine 1%, Doxepin 3%, Gabapentin 6%, Pentoxifylline 3%, Topiramate 1% Apply 1-2 grams to affected area 3-4 times daily Qty. 120 gm 3 refills 02/20/17   [provider]  omeprazole (PRILOSEC) 20 MG capsule TAKE 1 CAPSULE DAILY 30 TO 60 MINUTES BEFORE FIRST MEAL OF THE DAY 03/05/20   [name], MD  rosuvastatin (CRESTOR) 10 MG tablet Take 1 tablet (10 mg total) by mouth daily. 01/07/20   [name], MD  sacubitril-valsartan (ENTRESTO) 49-51 MG Take 1 tablet by mouth 2 (two) times daily. 04/03/20   Strader, Fransisco Hertz, PA-C  sodium chloride (MURO 128) 5 % ophthalmic solution Place 1 drop into the left eye at bedtime.    [provider]  tamsulosin (FLOMAX) 0.4 MG CAPS capsule TAKE 1 CAPSULE EVERY DAY 03/05/20   [name], MD  TRUEPLUS LANCETS 28G MISC Use to test blood sugars two times daily. Dx: E11.9 07/30/18   [name], MD     Family History  Problem Relation Age of Onset  . Melanoma Mother   . Stroke Father   . CAD Brother        Premature disease    Social History   Socioeconomic History  . Marital status: Married    Spouse name: Not on file  . Number of children: Not on file  . Years of education: Not on file  . Highest education level: Not on file  Occupational History  . Occupation: retired    Comment: maintenence work  Tobacco Use  . Smoking status: Former Smoker    Packs/day: 4.00    Years: 20.00    Pack years: 80.00    Types: Cigarettes    Quit date: 09/18/1984    Years since quitting: 35.7  .  Smokeless tobacco: Never Used  . Tobacco comment: quit in 1980  Vaping Use  . Vaping Use: Never used  Substance and Sexual Activity  . 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 Drug use: No  . Sexual activity: Yes  Other Topics Concern  . Not on file  Social History Narrative   Married 53 years in 2015. 4 kids (2 sons) and 3 grandkids.    Lived in [place], [place] wholel life      Retired from [employer]  Hobbies-rest, going to beach where they have a camper            Social Determinants of Health   Financial Resource Strain:   . Difficulty of Paying Living Expenses: Not on file  Food Insecurity:   . Worried About Charity fundraiser in the Last Year: Not on file  . Ran Out of Food in the Last Year: Not on file  Transportation Needs:   . Lack of Transportation (Medical): Not on file  . Lack of Transportation (Non-Medical): Not on file  Physical Activity:   . Days of Exercise per Week: Not on file  . Minutes of Exercise per Session: Not on file  Stress:   . Feeling of Stress : Not on file  Social Connections:   . Frequency of Communication with Friends and Family: Not on file  . Frequency of Social Gatherings with Friends and Family: Not on file  . Attends Religious Services: Not on file  . Active Member of Clubs or Organizations: Not on file  . Attends Archivist Meetings: Not on file  . Marital Status: Not on file     Review of Systems: A 12 point ROS discussed and pertinent positives are indicated in the HPI above.  All other systems are negative.  Review of Systems  Constitutional: Negative for fever.  HENT: Negative for congestion.   Respiratory: Negative for cough and shortness of breath.   Cardiovascular: Negative for chest pain.  Gastrointestinal: Negative for abdominal pain.  Neurological: Negative for headaches.  Psychiatric/Behavioral: Negative for behavioral problems and confusion.    Vital Signs: BP (!) 141/88  (BP Location: Right Arm)   Pulse 74   Temp 97.8 F (36.6 C) (Oral)   Resp 18   SpO2 98%   Physical Exam Vitals and nursing note reviewed.  Constitutional:      Appearance: He is well-developed.  HENT:     Head: Normocephalic.  Cardiovascular:     Rate and Rhythm: Normal rate and regular rhythm.     Heart sounds: Normal heart sounds.  Pulmonary:     Effort: Pulmonary effort is normal.     Breath sounds: Normal breath sounds.  Musculoskeletal:        General: Normal range of motion.     Cervical back: Normal range of motion.  Skin:    General: Skin is dry.  Neurological:     Mental Status: He is alert and oriented to person, place, and time.     Imaging: No results found.  Labs:  CBC: Recent Labs    12/04/19 0312 12/13/19 1336 01/22/20 1412 04/08/20 1447  WBC 8.6 5.9 11.8* 4.7  HGB 11.6* 11.2* 10.8* 10.8*  HCT 34.9* 32.5* 32.7* 33.9*  PLT 157 134.0* 101.0* 120*    COAGS: Recent Labs    12/04/19 0312 04/08/20 1446  INR 1.1 1.1    BMP: Recent Labs    12/08/19 0311 12/13/19 1336 01/15/20 0000 01/22/20 1412 02/05/20 1404 03/17/20 1335 04/08/20 1447  NA 139   < > 143 141 141 141 143  K 4.6   < > 4.9 4.2 4.6 4.4 4.5  CL 105   < > 106 102 105 105 108  CO2 25   < > 25* '27 29 26 27  ' GLUCOSE 122*   < >  --  146* 151* 128* 159*  BUN 30*   < > 17 29* '17 14 12  ' CALCIUM 8.9   < > 9.0 8.6 8.7  9.0 8.8*  CREATININE 1.38*   < > 1.4* 1.52* 1.07 0.93 1.14  GFRNONAA 49*  --  50  --   --  >60 >60  GFRAA 56*  --  58  --   --  >60 >60   < > = values in this interval not displayed.    LIVER FUNCTION TESTS: Recent Labs    12/04/19 0312 12/13/19 1336 01/22/20 1412 04/08/20 1447  BILITOT 1.0  0.9 0.4 0.7 0.3  AST '27  26 25 19 22  ' ALT '15  15 25 10 10  ' ALKPHOS 80  79 99 80 103  PROT 6.6  6.3* 6.4 6.0 6.5  ALBUMIN 3.4*  3.4* 3.6 3.7 3.4*    TUMOR MARKERS: No results for input(s): AFPTM, CEA, CA199, CHROMGRNA in the last 8760 hours.  Assessment and  Plan:  79  y.o, male outpatient. Former heavy alcohol use,. History of COPD (on O2 at night), HLD, DM, HTN, Hepatocellular carcinoma. Found to have a hypervascular mass in segment 4 A while being worked up for left renal mass. CT CAP from 5.7.21 showed a  3.2 X 2 cm hypervascular mass  in segment 4 A of the left lobe.  MRI dated 6.21.21 reads the dominant lesion of concern in the medial segment of the left hepatic lobe (segment 4 A) is diagnostic for hepatocellular carcinoma.  Due to multiple co- morbidities the patient is not a candidate for surgical resection. The patient was seen via telehealth consult on 7.2.21 with Dr. Owens Shark, Interventional Radiology for consult. The patient was given several different treatment options and decided to pursue bland embolization. Patient presents for bland embolization segment 4 A of the liver.   Patient has no know allergies. BUN 26, CR 1.37, Total bilirubin 0.4. All other labs and medications are within acceptable parameters.  Patient is afebrile.   Risks and benefits of bland embolization were discussed with the patient including, but not limited to, bleeding, infection, vascular injury, contrast induced renal failure, post procedural pain, nausea, vomiting, fatigue, progression of liver failure, chemical cholecystitis, or need for additional procedures.  This interventional procedure involves the use of X-rays and because of the nature of the planned procedure, it is possible that we will have prolonged use of X-ray fluoroscopy.  Potential radiation risks to you include (but are not limited to) the following: - A slightly elevated risk for cancer  several years later in life. This risk is typically less than 0.5% percent. This risk is low in comparison to the normal incidence of human cancer, which is 33% for women and 50% for men according to the [place]. - Radiation induced injury can include skin redness, resembling a rash, tissue  breakdown / ulcers and hair loss (which can be temporary or permanent).   The likelihood of either of these occurring depends on the difficulty of the procedure and whether you are sensitive to radiation due to previous procedures, disease, or genetic conditions.   IF your procedure requires a prolonged use of radiation, you will be notified and given written instructions for further action.  It is your responsibility to monitor the irradiated area for the 2 weeks following the procedure and to notify your physician if you are concerned that you have suffered a radiation induced injury.    All of the patient's questions were answered, patient is agreeable to proceed.  Consent signed and in chart.    Thank you for this interesting consult.  I greatly enjoyed  meeting [name] and look forward to participating in their care.  A copy of this report was sent to the requesting provider on this date.  Electronically Signed: Jacqualine Mau, NP 06/01/2020, 8:19 AM   I spent a total of  40 Minutes   in face to face in clinical consultation, greater than 50% of which was counseling/coordinating care for bland embolization of segment 4 A of the liver

## 2020-06-01 NOTE — Procedures (Signed)
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  Sonographic evaluation of the left common femoral artery and peripheral aspect of the left external iliac artery demonstrates echogenic plaque also resulting in a subtotal occlusion.  As such, hepatic embolization was NOT attempted as the 5 Fr vascular sheath would've likely resulting in occlusion.    Complications: None Immediate EBL: None  Keep right leg straight for 2 hrs (until 1230)  Signed: Sandi Mariscal Pager: [phone] 06/01/2020, 11:19 AM

## 2020-06-01 NOTE — Discharge Instructions (Signed)
Femoral Site Care This sheet gives you information about how to care for yourself after your procedure. Your health care provider may also give you more specific instructions. If you have problems or questions, contact your health care provider. What can I expect after the procedure? After the procedure, it is common to have:  Bruising that usually fades within 1-2 weeks.  Tenderness at the site. Follow these instructions at home: Wound care  Follow instructions from your health care provider about how to take care of your insertion site. Make sure you: ? Wash your hands with soap and water before you change your bandage (dressing). If soap and water are not available, use hand sanitizer. ? Change your dressing as told by your health care provider. ? Leave stitches (sutures), skin glue, or adhesive strips in place. These skin closures may need to stay in place for 2 weeks or longer. If adhesive strip edges start to loosen and curl up, you may trim the loose edges. Do not remove adhesive strips completely unless your health care provider tells you to do that.  Do not take baths, swim, or use a hot tub until your health care provider approves.  You may shower 24-48 hours after the procedure or as told by your health care provider. ? Gently wash the site with plain soap and water. ? Pat the area dry with a clean towel. ? Do not rub the site. This may cause bleeding.  Do not apply powder or lotion to the site. Keep the site clean and dry.  Check your femoral site every day for signs of infection. Check for: ? Redness, swelling, or pain. ? Fluid or blood. ? Warmth. ? Pus or a bad smell. Activity  For the first 2-3 days after your procedure, or as long as directed: ? Avoid climbing stairs as much as possible. ? Do not squat.  Do not lift anything that is heavier than 10 lb (4.5 kg), or the limit that you are told, until your health care provider says that it is safe.  Rest as  directed. ? Avoid sitting for a long time without moving. Get up to take short walks every 1-2 hours.  Do not drive for 24 hours if you were given a medicine to help you relax (sedative). General instructions  Take over-the-counter and prescription medicines only as told by your health care provider.  Keep all follow-up visits as told by your health care provider. This is important. Contact a health care provider if you have:  A fever or chills.  You have redness, swelling, or pain around your insertion site. Get help right away if:  The catheter insertion area swells very fast.  You pass out.  You suddenly start to sweat or your skin gets clammy.  The catheter insertion area is bleeding, and the bleeding does not stop when you hold steady pressure on the area.  The area near or just beyond the catheter insertion site becomes pale, cool, tingly, or numb. These symptoms may represent a serious problem that is an emergency. Do not wait to see if the symptoms will go away. Get medical help right away. Call your local emergency services (911 in the [place] Do not drive yourself to the hospital. Summary  After the procedure, it is common to have bruising that usually fades within 1-2 weeks.  Check your femoral site every day for signs of infection.  Do not lift anything that is heavier than 10 lb (4.5 kg), or the   limit that you are told, until your health care provider says that it is safe. This information is not intended to replace advice given to you by your health care provider. Make sure you discuss any questions you have with your health care provider. Document Revised: 10/09/2017 Document Reviewed: 10/09/2017 Elsevier Patient Education  2020 Elsevier Inc.    Moderate Conscious Sedation, Adult, Care After These instructions provide you with information about caring for yourself after your procedure. Your health care provider may also give you more specific instructions. Your  treatment has been planned according to current medical practices, but problems sometimes occur. Call your health care provider if you have any problems or questions after your procedure. What can I expect after the procedure? After your procedure, it is common:  To feel sleepy for several hours.  To feel clumsy and have poor balance for several hours.  To have poor judgment for several hours.  To vomit if you eat too soon. Follow these instructions at home: For at least 24 hours after the procedure:   Do not: ? Participate in activities where you could fall or become injured. ? Drive. ? Use heavy machinery. ? Drink alcohol. ? Take sleeping pills or medicines that cause drowsiness. ? Make important decisions or sign legal documents. ? Take care of children on your own.  Rest. Eating and drinking  Follow the diet recommended by your health care provider.  If you vomit: ? Drink water, juice, or soup when you can drink without vomiting. ? Make sure you have little or no nausea before eating solid foods. General instructions  Have a responsible adult stay with you until you are awake and alert.  Take over-the-counter and prescription medicines only as told by your health care provider.  If you smoke, do not smoke without supervision.  Keep all follow-up visits as told by your health care provider. This is important. Contact a health care provider if:  You keep feeling nauseous or you keep vomiting.  You feel light-headed.  You develop a rash.  You have a fever. Get help right away if:  You have trouble breathing. This information is not intended to replace advice given to you by your health care provider. Make sure you discuss any questions you have with your health care provider. Document Revised: 09/08/2017 Document Reviewed: 01/16/2016 Elsevier Patient Education  2020 Elsevier Inc.  

## 2020-06-02 LAB — AFP TUMOR MARKER: AFP, Serum, Tumor Marker: 4.5 ng/mL (ref 0.0–8.3)

## 2020-06-03 ENCOUNTER — Other Ambulatory Visit: Payer: Self-pay

## 2020-06-03 ENCOUNTER — Ambulatory Visit: Payer: Medicare HMO | Admitting: Student

## 2020-06-03 ENCOUNTER — Encounter: Payer: Self-pay | Admitting: Student

## 2020-06-03 VITALS — BP 126/72 | HR 74 | Ht 72.0 in | Wt 201.8 lb

## 2020-06-03 DIAGNOSIS — N1831 Chronic kidney disease, stage 3a: Secondary | ICD-10-CM | POA: Diagnosis not present

## 2020-06-03 DIAGNOSIS — C220 Liver cell carcinoma: Secondary | ICD-10-CM

## 2020-06-03 DIAGNOSIS — I10 Essential (primary) hypertension: Secondary | ICD-10-CM | POA: Diagnosis not present

## 2020-06-03 DIAGNOSIS — E785 Hyperlipidemia, unspecified: Secondary | ICD-10-CM

## 2020-06-03 DIAGNOSIS — I5022 Chronic systolic (congestive) heart failure: Secondary | ICD-10-CM | POA: Diagnosis not present

## 2020-06-03 NOTE — Progress Notes (Signed)
Cardiology Office Note    Date:  06/03/2020   ID:  Joshua, Frazier [birth year], MRN [id number]  PCP:  [name], MD  Cardiologist: Rozann Lesches, MD    Chief Complaint  Patient presents with   Follow-up    2 month visit    History of Present Illness:    [name] is a 79 y.o. male with past medical history of chronic systolic CHF (EF 02-40% by echo in 11/2019), HTN, HLD, Type 2 DM, Stage 3 CKD, liver cirrhosis, thrombocytopenia and COPD who presents to the office today for 12-monthfollow-up.   He was last examined by myself in 03/2020 and was still having dyspnea on exertion but denied any recent chest pain or palpitations. Recent CT had shown a 4 cm hypervascular mass in the left hepatic lobe and nodule in the right posterior lobe which was suspicious for hepatocellular carcinoma and Abdominal MRI appeared consistent with this as well. He had been referred to Oncology by his PCP but was awaiting his initial visit. Given his newly diagnosed cancer, a cardiac catheterization was not arranged as he would require DAPT for 6-12 months if requiring stent placement. He was continued on Coreg 6.277mBID, Entresto 49-5150mID and Lasix 12m55mily.   In the interim, he has followed-up with Oncology and IR. By review of notes, he is not felt to be a surgical candidate given his comorbidities. He was scheduled for band embolization on 06/01/2020 but imaging showed echogenic plaque along the left external iliac artery resulting in a subtotal occlusion and hepatic embolization was not attempted as it was felt this could result in occlusion.   In talking with the patient today, he reports he is scheduled for repeat abdominal MRI in 6 weeks for reassessment of his hepatic lesion. He says he is at peace with whatever decision is made in regards to treatment options but hopes providers will be honest with him in regards to his prognosis.  He feels like his breathing has significantly  improved since being started on Entresto. However, he is concerned about the cost of this as he is currently in the donut hole. He denies any recent orthopnea, PND or lower extremity edema. No recent palpitations or exertional chest pain.   Past Medical History:  Diagnosis Date   Benign prostatic hypertrophy    CHF (congestive heart failure) (HCC)[place] a. EF 20-25% by echo in 11/2019   COPD (chronic obstructive pulmonary disease) (HCC)[facility], 2011   FEV1 2.30 (70%) ratio 63 no better with B2 and DLCO 18/6 (73%) corrects to 106%   Degenerative joint disease    Depression    Diverticulosis    Essential hypertension    ACE inhibitor cough   Fatty liver    Hyperlipidemia    Internal hemorrhoids    Low back pain    Otitis externa 07/30/2012   Splenomegaly    Type 2 diabetes mellitus (HCC)    Ulcer of foot (HCC)    Right foot    Past Surgical History:  Procedure Laterality Date   IR EMBO TUMOR ORGAN ISCHEMIA INFARCT INC GUIDE ROADMAPPING  06/01/2020   IR RADIOLOGIST EVAL & MGMT  04/28/2020   NO PAST SURGERIES  1980    Current Medications: Outpatient Medications Prior to Visit  Medication Sig Dispense Refill   acyclovir (ZOVIRAX) 400 MG tablet TAKE 1 TABLET TWICE DAILY 180 tablet 1   albuterol (VENTOLIN HFA) 108 (90 Base) MCG/ACT inhaler  Inhale 2 puffs into the lungs every 6 (six) hours as needed for wheezing or shortness of breath. 6.7 g 3   amitriptyline (ELAVIL) 50 MG tablet TAKE 1 TABLET AT BEDTIME 90 tablet 1   ASCORBIC ACID PO Take 1 tablet by mouth daily.      aspirin EC 81 MG tablet Take 81 mg by mouth daily.     Blood Glucose Monitoring Suppl (TRUE METRIX AIR GLUCOSE METER) w/Device KIT      busPIRone (BUSPAR) 5 MG tablet Take 1 tablet (5 mg total) by mouth 2 (two) times daily as needed (anxiety). 60 tablet 5   carvedilol (COREG) 6.25 MG tablet Take 1 tablet (6.25 mg total) by mouth 2 (two) times daily. 180 tablet 3   Cholecalciferol 1000 UNITS  capsule Take 1 capsule (1,000 Units total) by mouth 2 (two) times daily.     clotrimazole (LOTRIMIN) 1 % cream For athlete's feet, apply to both feet and between toes twice daily for 4 weeks. 45 g 1   clotrimazole-betamethasone (LOTRISONE) cream Apply 1 application topically 2 (two) times daily. For 7 days maximum for severe jock itch . Stop if worsening symptoms. 45 g 1   escitalopram (LEXAPRO) 5 MG tablet Take 1 tablet (5 mg total) by mouth daily. 90 tablet 5   famotidine (PEPCID) 20 MG tablet TAKE 1 TABLET TWICE DAILY 180 tablet 1   finasteride (PROSCAR) 5 MG tablet TAKE 1 TABLET EVERY DAY 90 tablet 1   furosemide (LASIX) 20 MG tablet Take 1 tablet (20 mg total) by mouth daily. 90 tablet 1   gabapentin (NEURONTIN) 300 MG capsule Take 1 capsule (300 mg total) by mouth 2 (two) times daily. 180 capsule 3   glucose blood (TRUE METRIX BLOOD GLUCOSE TEST) test strip TEST BLOOD SUGAR EVERY DAY 100 strip 1   Multiple Vitamin (MULTIVITAMIN) capsule Take 1 capsule by mouth daily.       mupirocin ointment (BACTROBAN) 2 % Apply to right foot ulcer once daily and cover with dressing. 30 g 1   NONFORMULARY OR COMPOUNDED ITEM [facility]  Peripheral Neuropathy Cream- Bupivacaine 1%, Doxepin 3%, Gabapentin 6%, Pentoxifylline 3%, Topiramate 1% Apply 1-2 grams to affected area 3-4 times daily Qty. 120 gm 3 refills     rosuvastatin (CRESTOR) 10 MG tablet Take 1 tablet (10 mg total) by mouth daily. 90 tablet 3   sacubitril-valsartan (ENTRESTO) 49-51 MG Take 1 tablet by mouth 2 (two) times daily. 180 tablet 3   sodium chloride (MURO 128) 5 % ophthalmic solution Place 1 drop into the left eye at bedtime.     tamsulosin (FLOMAX) 0.4 MG CAPS capsule TAKE 1 CAPSULE EVERY DAY 90 capsule 1   TRUEPLUS LANCETS 28G MISC Use to test blood sugars two times daily. Dx: E11.9 200 each 3   omeprazole (PRILOSEC) 20 MG capsule TAKE 1 CAPSULE DAILY 30 TO 60 MINUTES BEFORE FIRST MEAL OF THE DAY (Patient not  taking: Reported on 06/03/2020) 90 capsule 1   No facility-administered medications prior to visit.     Allergies:   Patient has no known allergies.   Social History   Socioeconomic History   Marital status: Married    Spouse name: Not on file   Number of children: Not on file   Years of education: Not on file   Highest education level: Not on file  Occupational History   Occupation: retired    Comment: maintenence work  Tobacco Use   Smoking status: Former Smoker  Packs/day: 4.00    Years: 20.00    Pack years: 80.00    Types: Cigarettes    Quit date: 09/18/1984    Years since quitting: 35.7   Smokeless tobacco: Never Used   Tobacco comment: quit in 1980  Vaping Use   Vaping Use: Never used  Substance and Sexual Activity   Alcohol use: No    Comment: no drinking since 30 years plus   Drug use: No   Sexual activity: Yes  Other Topics Concern   Not on file  Social History Narrative   Married 53 years in 2015. 4 kids (2 sons) and 3 grandkids.    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            Social Determinants of Radio broadcast assistant Strain:    Difficulty of Paying Living Expenses: Not on file  Food Insecurity:    Worried About Charity fundraiser in the Last Year: Not on file   [place] in the Last Year: Not on file  Transportation Needs:    Film/video editor (Medical): Not on file   Lack of Transportation (Non-Medical): Not on file  Physical Activity:    Days of Exercise per Week: Not on file   Minutes of Exercise per Session: Not on file  Stress:    Feeling of Stress : Not on file  Social Connections:    Frequency of Communication with Friends and Family: Not on file   Frequency of Social Gatherings with Friends and Family: Not on file   Attends Religious Services: Not on file   Active Member of Clubs or  Organizations: Not on file   Attends Archivist Meetings: Not on file   Marital Status: Not on file     Family History:  The patient's family history includes CAD in his brother; Melanoma in his mother; Stroke in his father.   Review of Systems:   Please see the history of present illness.     General:  No chills, fever, night sweats or weight changes.  Cardiovascular:  No chest pain, edema, orthopnea, palpitations, paroxysmal nocturnal dyspnea. Positive for dyspnea on exertion (improved).  Dermatological: No rash, lesions/masses Respiratory: No cough, dyspnea Urologic: No hematuria, dysuria Abdominal:   No nausea, vomiting, diarrhea, bright red blood per rectum, melena, or hematemesis Neurologic:  No visual changes, wkns, changes in mental status. All other systems reviewed and are otherwise negative except as noted above.   Physical Exam:    VS:  BP 126/72    Pulse 74    Ht 6' (1.829 m)    Wt 201 lb 12.8 oz (91.5 kg)    SpO2 95%    BMI 27.37 kg/m    General: Well developed, elderly male appearing in no acute distress. Head: Normocephalic, atraumatic. Neck: No carotid bruits. JVD not elevated.  Lungs: Respirations regular and unlabored, without wheezes or rales.  Heart: Regular rate and rhythm. No S3 or S4.  No rubs or gallops appreciated. 2/6 SEM along RUSB.  Abdomen: Appears non-distended. No obvious abdominal masses. Msk:  Strength and tone appear normal for age. No obvious joint deformities or effusions. Extremities: No clubbing or cyanosis. Trace ankle edema bilaterally.  Distal pedal pulses are 2+ bilaterally. Neuro: Alert and oriented X 3. Moves all extremities spontaneously. No focal deficits noted. Psych:  Responds to questions appropriately with a normal affect. Skin: No rashes or lesions noted  Wt Readings from Last 3 Encounters:  06/03/20 201 lb 12.8 oz (91.5 kg)  06/01/20 194 lb (88 kg)  04/08/20 204 lb 6.4 oz (92.7 kg)     Studies/Labs Reviewed:    EKG:  EKG is not ordered today.   Recent Labs: 12/03/2019: B Natriuretic Peptide 927.6 12/04/2019: Magnesium 1.3; TSH 4.225 06/01/2020: ALT 23; BUN 26; Creatinine, Ser 1.37; Hemoglobin 11.6; Platelets 85; Potassium 4.6; Sodium 139   Lipid Panel    Component Value Date/Time   CHOL 120 11/07/2019 1146   TRIG 126.0 11/07/2019 1146   HDL 45.60 11/07/2019 1146   CHOLHDL 3 11/07/2019 1146   VLDL 25.2 11/07/2019 1146   LDLCALC 49 11/07/2019 1146   LDLDIRECT 57.0 10/22/2018 1451    Additional studies/ records that were reviewed today include:   Echocardiogram: 11/2019 IMPRESSIONS    1. No LV thrombus seen on contrast imaging. Left ventricular ejection  fraction, by estimation, is 20 to 25%. The left ventricle has severely  decreased function. The left ventricle demonstrates global hypokinesis.  The left ventricular internal cavity  size was mildly dilated. Indeterminate diastolic filling due to E-A  fusion.  2. Right ventricular systolic function is normal. The right ventricular  size is normal. Tricuspid regurgitation signal is inadequate for assessing  PA pressure.  3. The mitral valve is grossly normal. Trivial mitral valve  regurgitation. No evidence of mitral stenosis.  4. The aortic valve is grossly normal. Aortic valve regurgitation is not  visualized. Mild aortic valve stenosis.   Assessment:    1. Chronic systolic heart failure ([place])   2. Essential hypertension   3. Hyperlipidemia LDL goal <70   4. Stage 3a chronic kidney disease   5. 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 A cardiac catheterization has not been pursued given that he was recently diagnosed with hepatocellular carcinoma two months ago and  is now being followed by Oncology for treatment options. Given that he has been on medical therapy for several months, will plan to obtain a repeat echocardiogram to reassess LV function and wall motion. He remains on Coreg 6.25 mg twice daily, Lasix 20 mg daily and Entresto 49-51 mg twice daily (samples provided today). If EF remains significantly reduced, would try adding Spironolactone 12.5 mg daily if BP allows. He is also on ASA 61m daily and Crestor 118mdaily for presumed CAD.  2. HTN - BP is well controlled at 126/72 during today's visit but he does report this is typically lower when checked at home. Continue current medication regimen for now with Coreg 6.25 mg twice daily and Entresto 49-51 mg twice daily.  3. HLD - LDL was at 49 when checked in 10/2019. Continue Crestor 10 mg daily.    4. Stage 3 CKD - Creatinine has been variable from 1.1 - 1.3 over the past few months. At 1.37 by recent labs in 05/2020.  5. Hepatocellular Carcinoma  - He is being followed by Oncology and has an appointment next week for further evaluation. Band embolization on 06/01/2020 was unsuccessful as outlined above given PAD.     Medication Adjustments/Labs and Tests Ordered:  Current medicines are reviewed at length with the patient today.  Concerns regarding medicines are outlined above.  Medication changes, Labs and Tests ordered today are listed in the Patient Instructions below. Patient Instructions  Medication Instructions:  Your physician recommends that you continue on your current medications as directed. Please refer to the Current Medication list given to you today.  You have been given samples of Entresto today.   *If you need a refill on your cardiac medications before your next appointment, please call your pharmacy*   Lab Work: NONE  If you have labs (blood work) drawn today and your tests are completely normal, you will receive your results only by:  [place] (if you have  MyChart) OR  A paper copy in the mail If you have any lab test that is abnormal or we need to change your treatment, we will call you to review the results.   Testing/Procedures: Your physician has requested that you have an echocardiogram. Echocardiography is a painless test that uses sound waves to create images of your heart. It provides your doctor with information about the size and shape of your heart and how well your hearts chambers and valves are working. This procedure takes approximately one hour. There are no restrictions for this procedure.     Follow-Up: At [facility], you and your health needs are our priority.  As part of our continuing mission to provide you with exceptional heart care, we have created designated Provider Care Teams.  These Care Teams include your primary Cardiologist (physician) and Advanced Practice Providers (APPs -  Physician Assistants and Nurse Practitioners) who all work together to provide you with the care you need, when you need it.  We recommend signing up for the patient portal called "MyChart".  Sign up information is provided on this After Visit Summary.  MyChart is used to connect with patients for Virtual Visits (Telemedicine).  Patients are able to view lab/test results, encounter notes, upcoming appointments, etc.  Non-urgent messages can be sent to your provider as well.   To learn more about what you can do with MyChart, go to [email or web address].    Your next appointment:   2 month(s)  The format for your next appointment:   In Person  Provider:   Rozann Lesches, MD or Bernerd Pho, PA-C   Other Instructions Thank you for choosing [facility]!       Signed, Erma Heritage, PA-C  06/03/2020 5:37 PM    [place] [address] [place], [place] [address] Phone: [phone] Fax: [phone]

## 2020-06-03 NOTE — Patient Instructions (Addendum)
Medication Instructions:  Your physician recommends that you continue on your current medications as directed. Please refer to the Current Medication list given to you today.  You have been given samples of Entresto today.   *If you need a refill on your cardiac medications before your next appointment, please call your pharmacy*   Lab Work: NONE  If you have labs (blood work) drawn today and your tests are completely normal, you will receive your results only by: [place] [facility] MyChart Message (if you have MyChart) OR . A paper copy in the mail If you have any lab test that is abnormal or we need to change your treatment, we will call you to review the results.   Testing/Procedures: Your physician has requested that you have an echocardiogram. Echocardiography is a painless test that uses sound waves to create images of your heart. It provides your doctor with information about the size and shape of your heart and how well your heart's chambers and valves are working. This procedure takes approximately one hour. There are no restrictions for this procedure.     Follow-Up: At [facility], you and your health needs are our priority.  As part of our continuing mission to provide you with exceptional heart care, we have created designated Provider Care Teams.  These Care Teams include your primary Cardiologist (physician) and Advanced Practice Providers (APPs -  Physician Assistants and Nurse Practitioners) who all work together to provide you with the care you need, when you need it.  We recommend signing up for the patient portal called "MyChart".  Sign up information is provided on this After Visit Summary.  MyChart is used to connect with patients for Virtual Visits (Telemedicine).  Patients are able to view lab/test results, encounter notes, upcoming appointments, etc.  Non-urgent messages can be sent to your provider as well.   To learn more about what you can do with MyChart, go to  [email or web address].    Your next appointment:   2 month(s)  The format for your next appointment:   In Person  Provider:   Rozann Lesches, MD or Bernerd Pho, PA-C   Other Instructions Thank you for choosing [facility]!

## 2020-06-08 ENCOUNTER — Other Ambulatory Visit: Payer: Self-pay | Admitting: *Deleted

## 2020-06-08 DIAGNOSIS — C220 Liver cell carcinoma: Secondary | ICD-10-CM

## 2020-06-09 ENCOUNTER — Inpatient Hospital Stay: Payer: Medicare HMO | Admitting: Hematology

## 2020-06-09 ENCOUNTER — Telehealth: Payer: Self-pay | Admitting: Hematology

## 2020-06-09 ENCOUNTER — Other Ambulatory Visit: Payer: Self-pay

## 2020-06-09 ENCOUNTER — Other Ambulatory Visit: Payer: Self-pay | Admitting: Interventional Radiology

## 2020-06-09 ENCOUNTER — Inpatient Hospital Stay: Payer: Medicare HMO | Attending: Hematology

## 2020-06-09 VITALS — BP 137/64 | HR 73 | Temp 97.1°F | Resp 18 | Ht 72.0 in | Wt 201.1 lb

## 2020-06-09 DIAGNOSIS — C220 Liver cell carcinoma: Secondary | ICD-10-CM

## 2020-06-09 DIAGNOSIS — E1142 Type 2 diabetes mellitus with diabetic polyneuropathy: Secondary | ICD-10-CM

## 2020-06-09 DIAGNOSIS — K7460 Unspecified cirrhosis of liver: Secondary | ICD-10-CM | POA: Diagnosis not present

## 2020-06-09 DIAGNOSIS — N183 Chronic kidney disease, stage 3 unspecified: Secondary | ICD-10-CM

## 2020-06-09 DIAGNOSIS — E785 Hyperlipidemia, unspecified: Secondary | ICD-10-CM

## 2020-06-09 DIAGNOSIS — K766 Portal hypertension: Secondary | ICD-10-CM | POA: Diagnosis not present

## 2020-06-09 DIAGNOSIS — Z79899 Other long term (current) drug therapy: Secondary | ICD-10-CM | POA: Insufficient documentation

## 2020-06-09 DIAGNOSIS — D696 Thrombocytopenia, unspecified: Secondary | ICD-10-CM | POA: Diagnosis not present

## 2020-06-09 DIAGNOSIS — E538 Deficiency of other specified B group vitamins: Secondary | ICD-10-CM | POA: Insufficient documentation

## 2020-06-09 DIAGNOSIS — I10 Essential (primary) hypertension: Secondary | ICD-10-CM

## 2020-06-09 DIAGNOSIS — I5022 Chronic systolic (congestive) heart failure: Secondary | ICD-10-CM

## 2020-06-09 LAB — CBC WITH DIFFERENTIAL (CANCER CENTER ONLY)
Abs Immature Granulocytes: 0 10*3/uL (ref 0.00–0.07)
Basophils Absolute: 0 10*3/uL (ref 0.0–0.1)
Basophils Relative: 1 %
Eosinophils Absolute: 0.1 10*3/uL (ref 0.0–0.5)
Eosinophils Relative: 1 %
HCT: 33.5 % — ABNORMAL LOW (ref 39.0–52.0)
Hemoglobin: 10.9 g/dL — ABNORMAL LOW (ref 13.0–17.0)
Immature Granulocytes: 0 %
Lymphocytes Relative: 39 %
Lymphs Abs: 1.6 10*3/uL (ref 0.7–4.0)
MCH: 33.2 pg (ref 26.0–34.0)
MCHC: 32.5 g/dL (ref 30.0–36.0)
MCV: 102.1 fL — ABNORMAL HIGH (ref 80.0–100.0)
Monocytes Absolute: 0.3 10*3/uL (ref 0.1–1.0)
Monocytes Relative: 7 %
Neutro Abs: 2.1 10*3/uL (ref 1.7–7.7)
Neutrophils Relative %: 52 %
Platelet Count: 73 10*3/uL — ABNORMAL LOW (ref 150–400)
RBC: 3.28 MIL/uL — ABNORMAL LOW (ref 4.22–5.81)
RDW: 15.4 % (ref 11.5–15.5)
WBC Count: 4.1 10*3/uL (ref 4.0–10.5)
nRBC: 0 % (ref 0.0–0.2)

## 2020-06-09 LAB — CMP (CANCER CENTER ONLY)
ALT: 16 U/L (ref 0–44)
AST: 26 U/L (ref 15–41)
Albumin: 3.7 g/dL (ref 3.5–5.0)
Alkaline Phosphatase: 104 U/L (ref 38–126)
Anion gap: 6 (ref 5–15)
BUN: 22 mg/dL (ref 8–23)
CO2: 28 mmol/L (ref 22–32)
Calcium: 10 mg/dL (ref 8.9–10.3)
Chloride: 107 mmol/L (ref 98–111)
Creatinine: 1.33 mg/dL — ABNORMAL HIGH (ref 0.61–1.24)
GFR, Est AFR Am: 59 mL/min — ABNORMAL LOW (ref >60)
GFR, Estimated: 50 mL/min — ABNORMAL LOW (ref >60)
Glucose, Bld: 175 mg/dL — ABNORMAL HIGH (ref 70–99)
Potassium: 5.1 mmol/L (ref 3.5–5.1)
Sodium: 141 mmol/L (ref 135–145)
Total Bilirubin: 0.4 mg/dL (ref 0.3–1.2)
Total Protein: 6.6 g/dL (ref 6.5–8.1)

## 2020-06-09 NOTE — Progress Notes (Signed)
HEMATOLOGY/ONCOLOGY CLINIC NOTE  Date of Service: 06/09/2020  Patient Care Team: [name], MD as PCP - General (Family Medicine) Satira Sark, MD as PCP - Cardiology (Cardiology) Marilynne Halsted, MD as Referring Physician (Ophthalmology) Brunetta Genera, MD as Consulting Physician (Hematology) Pyrtle, Lajuan Lines, MD as Consulting Physician (Gastroenterology) Marzetta Board, DPM as Consulting Physician (Podiatry) Jonnie Finner, RN as Oncology Nurse Navigator Madelin Rear, [facility] as Pharmacist (Pharmacist)  CHIEF COMPLAINTS/PURPOSE OF CONSULTATION:  Thrombocytopenia  HISTORY OF PRESENTING ILLNESS:   [name] is a wonderful 79 y.o. 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 He is accompanied today by his wife. The pt reports that he is doing well overall.   The pt reports that he has had low energy levels from the past 3-4 years since he retired. Today he feels fatigued as well.  He has been taking Metformin for the last 10 years and has recently begun replacing Vitamin B12. He has neuropathy in his feet and has experienced some falls and uses a cane at night. He is also taking Finasteride  Most recent lab results (02/28/18) of CBC  is as follows: all values are WNL except for RBC at 3.42, HGB at 11.4, HCT at 33.5, Platelets at 127k. Testosterone 02/13/18 was lower at 185  On review of systems, pt reports neuropathy in his feet, some fatigue, stable back pain, some ankle swelling, and denies abdominal pains and any other symptoms.   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 We are joined today by his wife. The patient's last visit  with [place] was on 04/08/2020. The pt reports that he is doing well overall.  The pt reports that he spoke with Dr. Pascal Lux, who decided not to proceed with arterially directed therapies. Dr. Pascal Lux is planning on setting the patient up for a repeat MRI in 6 weeks. He has felt well in the interim and denies any new symptoms.   Lab results today (06/09/20) of CBC w/diff and CMP is as follows: all values are WNL except for RBC at 3.28, Hgb at 10.9, HCT at 33.5, MCV at 102.1, PLT at 73K, Glucose at 175, Creatinine at 1.33, GFR Est Non Af Am at 50. 06/09/2020 AFP is in progress Component     Latest Ref Rng & Units 04/08/2020 05/08/2020 06/01/2020  AFP, Serum, Tumor Marker     0.0 - 8.3 ng/mL 3.7 4.3 4.5    On review of systems, pt reports diarrhea and denies low appetite, unexpected weight loss, abdominal pain, constipation and any other symptoms.   MEDICAL HISTORY:  Past Medical History:  Diagnosis Date  . Benign prostatic hypertrophy   . CHF (congestive heart failure) ([place])    a. EF 20-25% by echo in 11/2019  . COPD (chronic obstructive pulmonary disease) ([place]) December 15, 2009   FEV1 2.30 (70%) ratio 63 no better with B2 and DLCO 18/6 (73%) corrects to 106%  . Degenerative joint disease   . Depression   . Diverticulosis   . Essential hypertension    ACE inhibitor cough  . Fatty liver   . Hyperlipidemia   . Internal hemorrhoids   .  Low back pain   . Otitis externa 07/30/2012  . Splenomegaly   . Type 2 diabetes mellitus ([place])   . Ulcer of foot ([place])    Right foot    SURGICAL HISTORY: Past Surgical History:  Procedure Laterality Date  . IR EMBO TUMOR ORGAN ISCHEMIA INFARCT INC GUIDE ROADMAPPING  06/01/2020  . IR RADIOLOGIST EVAL & MGMT  04/28/2020  . NO PAST SURGERIES  1980    SOCIAL HISTORY: Social History   Socioeconomic History  . Marital status: Married    Spouse name: Not on file  . Number of children: Not on file  . Years of education: Not on file  . Highest education level:  Not on file  Occupational History  . Occupation: retired    Comment: maintenence work  Tobacco Use  . Smoking status: Former Smoker    Packs/day: 4.00    Years: 20.00    Pack years: 80.00    Types: Cigarettes    Quit date: 09/18/1984    Years since quitting: 35.7  . Smokeless tobacco: Never Used  . Tobacco comment: quit in 1980  Vaping Use  . Vaping Use: Never used  Substance and Sexual Activity  . 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 Drug use: No  . Sexual activity: Yes  Other Topics Concern  . Not on file  Social History Narrative   Married 53 years in 2015. 4 kids (2 sons) and 3 grandkids.    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 Difficulty of Paying Living Expenses: Not on file  Food Insecurity:   . Worried About Charity fundraiser in the Last Year: Not on file  . Ran Out of Food in the Last Year: Not on file  Transportation Needs:   . Lack of Transportation (Medical): Not on file  . Lack of Transportation (Non-Medical): Not on file  Physical Activity:   . Days of Exercise per Week: Not on file  . Minutes of Exercise per Session: Not on file  Stress:   . Feeling of Stress : Not on file  Social Connections:   . Frequency of Communication with Friends and Family: Not on file  . Frequency of Social Gatherings with Friends and Family: Not on file  . Attends Religious Services: Not on file  . Active Member of Clubs or Organizations: Not on file  . Attends Archivist Meetings: Not on file  . Marital Status: Not on file  Intimate Partner Violence:   . Fear of Current or Ex-Partner: Not on file  . Emotionally Abused: Not on file  . Physically Abused: Not on file  . Sexually Abused: Not on file    FAMILY HISTORY: Family History  Problem Relation Age of Onset  .  Melanoma Mother   . Stroke Father   . CAD Brother        Premature disease    ALLERGIES:  has No Known Allergies.  MEDICATIONS:  Current Outpatient Medications  Medication Sig Dispense Refill  . acyclovir (ZOVIRAX) 400 MG tablet TAKE 1 TABLET TWICE DAILY 180 tablet 1  . albuterol (VENTOLIN HFA) 108 (90 Base) MCG/ACT inhaler Inhale 2 puffs into the lungs every  6 (six) hours as needed for wheezing or shortness of breath. 6.7 g 3  . amitriptyline (ELAVIL) 50 MG tablet TAKE 1 TABLET AT BEDTIME 90 tablet 1  . ASCORBIC ACID PO Take 1 tablet by mouth daily.     Marland Kitchen aspirin EC 81 MG tablet Take 81 mg by mouth daily.    . Blood Glucose Monitoring Suppl (TRUE METRIX AIR GLUCOSE METER) w/Device KIT     . busPIRone (BUSPAR) 5 MG tablet Take 1 tablet (5 mg total) by mouth 2 (two) times daily as needed (anxiety). 60 tablet 5  . carvedilol (COREG) 6.25 MG tablet Take 1 tablet (6.25 mg total) by mouth 2 (two) times daily. 180 tablet 3  . Cholecalciferol 1000 UNITS capsule Take 1 capsule (1,000 Units total) by mouth 2 (two) times daily.    . clotrimazole (LOTRIMIN) 1 % cream For athlete's feet, apply to both feet and between toes twice daily for 4 weeks. 45 g 1  . clotrimazole-betamethasone (LOTRISONE) cream Apply 1 application topically 2 (two) times daily. For 7 days maximum for severe jock itch . Stop if worsening symptoms. 45 g 1  . escitalopram (LEXAPRO) 5 MG tablet Take 1 tablet (5 mg total) by mouth daily. 90 tablet 5  . famotidine (PEPCID) 20 MG tablet TAKE 1 TABLET TWICE DAILY 180 tablet 1  . finasteride (PROSCAR) 5 MG tablet TAKE 1 TABLET EVERY DAY 90 tablet 1  . furosemide (LASIX) 20 MG tablet Take 1 tablet (20 mg total) by mouth daily. 90 tablet 1  . gabapentin (NEURONTIN) 300 MG capsule Take 1 capsule (300 mg total) by mouth 2 (two) times daily. 180 capsule 3  . glucose blood (TRUE METRIX BLOOD GLUCOSE TEST) test strip TEST BLOOD SUGAR EVERY DAY 100 strip 1  . Multiple Vitamin (MULTIVITAMIN)  capsule Take 1 capsule by mouth daily.      . mupirocin ointment (BACTROBAN) 2 % Apply to right foot ulcer once daily and cover with dressing. 30 g 1  . NONFORMULARY OR COMPOUNDED ITEM [facility]  Peripheral Neuropathy Cream- Bupivacaine 1%, Doxepin 3%, Gabapentin 6%, Pentoxifylline 3%, Topiramate 1% Apply 1-2 grams to affected area 3-4 times daily Qty. 120 gm 3 refills    . omeprazole (PRILOSEC) 20 MG capsule TAKE 1 CAPSULE DAILY 30 TO 60 MINUTES BEFORE FIRST MEAL OF THE DAY (Patient not taking: Reported on 06/03/2020) 90 capsule 1  . rosuvastatin (CRESTOR) 10 MG tablet Take 1 tablet (10 mg total) by mouth daily. 90 tablet 3  . sacubitril-valsartan (ENTRESTO) 49-51 MG Take 1 tablet by mouth 2 (two) times daily. 180 tablet 3  . sodium chloride (MURO 128) 5 % ophthalmic solution Place 1 drop into the left eye at bedtime.    . tamsulosin (FLOMAX) 0.4 MG CAPS capsule TAKE 1 CAPSULE EVERY DAY 90 capsule 1  . TRUEPLUS LANCETS 28G MISC Use to test blood sugars two times daily. Dx: E11.9 200 each 3   No current facility-administered medications for this visit.    REVIEW OF SYSTEMS:   A 10+ POINT REVIEW OF SYSTEMS WAS OBTAINED including neurology, dermatology, psychiatry, cardiac, respiratory, lymph, extremities, GI, GU, Musculoskeletal, constitutional, breasts, reproductive, HEENT.  All pertinent positives are noted in the HPI.  All others are negative.   PHYSICAL EXAMINATION:  . Vitals:   06/09/20 0925  BP: 137/64  Pulse: 73  Resp: 18  Temp: (!) 97.1 F (36.2 C)  SpO2: 98%   Filed Weights   06/09/20 0925  Weight: 201 lb 1.6  oz (91.2 kg)   .Body mass index is 27.27 kg/m.  Exam was given in a chair   GENERAL:alert, in no acute distress and comfortable SKIN: no acute rashes, no significant lesions EYES: conjunctiva are pink and non-injected, sclera anicteric OROPHARYNX: MMM, no exudates, no oropharyngeal erythema or ulceration NECK: supple, no JVD LYMPH:  no palpable  lymphadenopathy in the cervical, axillary or inguinal regions LUNGS: clear to auscultation b/l with normal respiratory effort HEART: regular rate & rhythm ABDOMEN:  normoactive bowel sounds , non tender, not distended. No palpable hepatosplenomegaly.  Extremity: trace pedal edema PSYCH: alert & oriented x 3 with fluent speech NEURO: no focal motor/sensory deficits  LABORATORY DATA:  I have reviewed the data as listed  . CBC Latest Ref Rng & Units 06/09/2020 06/01/2020 04/08/2020  WBC 4.0 - 10.5 K/uL 4.1 4.5 4.7  Hemoglobin 13.0 - 17.0 g/dL 10.9(L) 11.6(L) 10.8(L)  Hematocrit 39 - 52 % 33.5(L) 34.6(L) 33.9(L)  Platelets 150 - 400 K/uL 73(L) 85(L) 120(L)   . CBC    Component Value Date/Time   WBC 4.1 06/09/2020 0859   WBC 4.5 06/01/2020 0815   RBC 3.28 (L) 06/09/2020 0859   HGB 10.9 (L) 06/09/2020 0859   HCT 33.5 (L) 06/09/2020 0859   HCT 33.1 (L) 06/21/2018 1332   PLT 73 (L) 06/09/2020 0859   MCV 102.1 (H) 06/09/2020 0859   MCH 33.2 06/09/2020 0859   MCHC 32.5 06/09/2020 0859   RDW 15.4 06/09/2020 0859   LYMPHSABS 1.6 06/09/2020 0859   MONOABS 0.3 06/09/2020 0859   EOSABS 0.1 06/09/2020 0859   BASOSABS 0.0 06/09/2020 0859    . CMP Latest Ref Rng & Units 06/09/2020 06/01/2020 04/08/2020  Glucose 70 - 99 mg/dL 175(H) 126(H) 159(H)  BUN 8 - 23 mg/dL 22 26(H) 12  Creatinine 0.61 - 1.24 mg/dL 1.33(H) 1.37(H) 1.14  Sodium 135 - 145 mmol/L 141 139 143  Potassium 3.5 - 5.1 mmol/L 5.1 4.6 4.5  Chloride 98 - 111 mmol/L 107 105 108  CO2 22 - 32 mmol/L '28 24 27  ' Calcium 8.9 - 10.3 mg/dL 10.0 9.0 8.8(L)  Total Protein 6.5 - 8.1 g/dL 6.6 6.8 6.5  Total Bilirubin 0.3 - 1.2 mg/dL 0.4 0.4 0.3  Alkaline Phos 38 - 126 U/L 104 81 103  AST 15 - 41 U/L 26 37 22  ALT 0 - 44 U/L '16 23 10   ' Component     Latest Ref Rng & Units 06/21/2018  Folate, Hemolysate     Not Estab. ng/mL >620.0  HCT     37.5 - 51.0 % 33.1 (L)  Folate, RBC     >498 ng/mL >1,873  Methylmalonic Acid, Quantitative      0 - 378 nmol/L 294  DISCLAIMER      Comment  Specimen Type      [place]  Lead-Whole Blood     0 - 4 ug/dL 2  Vitamin B12     180 - 914 pg/mL 836  Homocysteine     0.0 - 15.0 umol/L 11.2    02/28/18 Surgical Pathology:   02/28/18 Cytogenetics:    RADIOGRAPHIC STUDIES: I have personally reviewed the radiological images as listed and agreed with the findings in the report. IR EMBO TUMOR ORGAN ISCHEMIA INFARCT INC GUIDE ROADMAPPING  Result Date: 06/01/2020 INDICATION: History of incidentally discovered hepatocellular carcinoma. Patient presents today for attempted mesenteric arteriogram and bland embolization. Please refer to formal consultation in the epic EMR dated 04/28/2020 for additional details. EXAM:  1. ULTRASOUND GUIDANCE FOR ARTERIAL ACCESS 2. RIGHT EXTERNAL AND COMMON FEMORAL ARTERIOGRAM COMPARISON:  Abdominal CTA-02/14/2020; abdominal MRI-03/30/2020 MEDICATIONS: None ANESTHESIA/SEDATION: Moderate (conscious) sedation was employed during this procedure. A total of Versed 2 mg and Fentanyl 50 mcg was administered intravenously. Moderate Sedation Time: 20 minutes. The patient's level of consciousness and vital signs were monitored continuously by radiology nursing throughout the procedure under my direct supervision. CONTRAST:  15 cc Omnipaque 300 FLUOROSCOPY TIME:  2 minutes, 6 seconds (144 mGy) COMPLICATIONS: None immediate. PROCEDURE: Informed consent was obtained from the patient following explanation of the procedure, risks, benefits and alternatives. All questions were addressed. Note was made of great difficulty palpating either common femoral artery or requiring Doppler pulses at the level of either ankle. Regardless, the bilateral groins were prepped and draped in usual sterile fashion. A time out was performed prior to the initiation of the procedure. Maximal barrier sterile technique utilized including caps, mask, sterile gowns, sterile gloves, large sterile drape, hand  hygiene, and Betadine prep. The right femoral head was marked fluoroscopically. Sonographic evaluation demonstrated extensive echogenic near occlusive plaque involving bilateral common femoral arteries as well as the imaged peripheral aspects of the bilateral external iliac arteries. Under direct ultrasound guidance, the right common femoral artery was accessed at a location between 2 large eccentric areas of plaque. Under direct fluoroscopic guidance, a Nitrex wire was advanced with some difficulty to the level of the inferior aspect of the abdominal aorta. Next, the micropuncture needle was exchanged for the inner catheter of the micropuncture sheath and right external iliac and common femoral arteriograms were performed in various obliquities Images were reviewed and the decision was made to abort the procedure. The inner micro puncture catheter was removed and hemostasis was achieved with manual compression. A dressing was applied. The patient tolerated the procedure well without immediate postprocedural complication. FINDINGS: Sonographic evaluation demonstrates a large amount of echogenic atherosclerotic plaque resulting in subtotal occlusion of the bilateral common femoral arteries and imaged peripheral aspects of the bilateral external iliac arteries. Limited right external iliac and common femoral arteriograms demonstrates large amount of atherosclerotic plaque resulting in subtotal occlusion and felt to mimic the sonographic appearance of the contralateral left common femoral and external iliac arteries. As such, the decision was made to abort procedure given concern that the standard sized 5 [place] vascular sheath would result in complete occlusion of the inflow vascular to either lower extremity. 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 PLAN: Above  was discussed with referring oncologist, Dr. Irene Limbo. Will proceed with obtaining a follow-up MRI 3 months following the preprocedural MRI (end of September/early October). Ultimately, given the patient's multiple medical comorbidities, I am doubtful the patient will be a candidate for ANY hepatic directed therapy however this surveillance MRI will provide some information regarding the suspected hepatocellular carcinoma's aggressiveness. Electronically Signed   By: Sandi Mariscal M.D.   On: 06/01/2020 17:18    ASSESSMENT & PLAN:   79 y.o. male with  1. Thrombocytopenia  Mild Plt 127k with minimal normocytic anemia. 02/28/18 surgical pathology report with no overt pathology, could not rule out MDS, and noted an increase in ring sideroblasts 02/28/18 BM cytogenetics report was unremarkable  2. 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 2. 4 cm hypervascular mass in segment 4A of left hepatic lobe, and 8 mm hypervascular nodule in posterior right hepatic lobe. These are highly suspicious for hepatocellular carcinomas in the setting of cirrhosis. 3. No evidence of metastatic disease." -03/27/2020 MRI Abdomen ([id number]) revealed "1. The dominant lesion of concern in the medial segment of the left hepatic lobe (segment 4 A) is diagnostic of hepatocellular carcinoma (LR 5). 2. The other small lesion questioned inferiorly in the right lobe is not clearly seen, although evaluation of this area is mildly limited by motion artifact." PLAN: -Discussed pt labwork today, 06/09/20; all values are WNL except for RBC at 3.28, Hgb at 10.9, HCT at 33.5, MCV at 102.1, PLT at 73K, Glucose at 175, Creatinine at 1.33, GFR Est Non Af Am at 50. AFP is in progress. -Advised pt that Hepatocellular Carcinomas grow at unpredictable  rates. It is possible that his disease will grow slowly.  -Discussed referring pt to Radiation Oncology for external beam radiation. -Discussed using systemic therapies to treat disease. Pt is not the best candidate due to heart and lung disease.  -Discussed keeping pt comfortable with best supportive cares. Advised pt that this may give him time without being burdened by the side effects of treatment.  -Advised pt that the 4 cm lesion is larger than what is typically treated with local ablation. May be treated incompletely due to it's size.  -Discussed w/ Dr. Pascal Lux who will arrange for MRI. If no local ablation therapies are considered will refer to Radiation Oncology.   -Recommend pt f/u for MRI as scheduled -Will see back in 2 months with labs   FOLLOW UP: RTC with Dr Irene Limbo in 2 months with labs   The total time spent in the appt was 20 minutes and more than 50% was on counseling and direct patient cares.  All of the patient's questions were answered with apparent satisfaction. The patient knows to call the clinic with any problems, questions or concerns.   (Office):       [phone] (Work cell):  [phone] (Fax):           [phone]  06/09/2020 10:11 AM  I, Yevette Edwards, am acting as a scribe for Dr. Sullivan Lone.   .I have reviewed the above documentation for accuracy and completeness, and I agree with the above. Brunetta Genera MD

## 2020-06-09 NOTE — Telephone Encounter (Signed)
Scheduled appointment per 8/31 los. Gave patient calendar printout at time of service.

## 2020-06-10 LAB — AFP TUMOR MARKER: AFP, Serum, Tumor Marker: 4.6 ng/mL (ref 0.0–8.3)

## 2020-06-11 ENCOUNTER — Ambulatory Visit (HOSPITAL_COMMUNITY): Admission: RE | Admit: 2020-06-11 | Payer: Medicare HMO | Source: Ambulatory Visit

## 2020-06-16 NOTE — Progress Notes (Unsigned)
Chronic Care Management Pharmacy   ***not seen Name: [name]  MRN: [id number] DOB: [birth year]  Chief Complaint/ HPI  [name],  79 y.o., male presents for their Initial CCM visit with the clinical pharmacist via telephone due to COVID-19 Pandemic.  PCP : [name], MD  Chronic conditions include:  Encounter Diagnoses  Name Primary?   Controlled type 2 diabetes mellitus with diabetic polyneuropathy, without long-term current use of insulin ([place]) Yes   Hyperlipidemia associated with type 2 diabetes mellitus ([place])    Essential hypertension    Chronic obstructive pulmonary disease, unspecified COPD type ([place])    Chronic systolic heart failure ([place])    Office Visits:  02/25/2020 (PCP): Diarrhea following most meals, stop metformin for a few weeks, immodium OTC; buspirone 5 mg twice daily for anxiety.  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[place]) 06/01/2020   Anxiety 74/05/1447   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[place]) 12/14/2019   Aortic atherosclerosis ([place]) 18/56/3149   Chronic systolic heart failure ([place]) 12/05/2019   Diabetic ulcer of right midfoot associated with diabetes mellitus due to underlying condition, with fat layer exposed ([place]) 11/07/2019   Anemia 03/05/2018   GERD (gastroesophageal reflux disease) 09/30/2017   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 positive 03/29/2016   Thrombocytopenia ([place]) 12/21/2015   B12 deficiency 08/20/2015   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   Diabetic polyneuropathy associated with type 2 diabetes mellitus ([place]) 08/07/2015   Diplopia 06/05/2015   CKD (chronic kidney disease), stage III 05/06/2015   Fatty liver 11/04/2014   Candidal balanoposthitis 06/20/2014   PCO (posterior capsular opacification) 06/19/2013   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 Off narcotics 06/05/17 due to negative UDS for opiates x2. Full history 06/12/14. Pain contract signed.  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[place]) 04/06/2007   Hyperlipidemia associated with type 2 diabetes mellitus ([place]) 04/06/2007   Essential hypertension 04/06/2007   Osteoarthritis 04/06/2007   Past Surgical History:  Procedure Laterality Date   IR EMBO TUMOR ORGAN ISCHEMIA INFARCT INC GUIDE ROADMAPPING  06/01/2020   IR RADIOLOGIST EVAL & MGMT  04/28/2020   NO PAST SURGERIES  1980   Family History  Problem Relation Age of Onset   Melanoma Mother    Stroke Father    CAD Brother        Premature disease   No Known Allergies Outpatient Encounter Medications as of 06/17/2020  Medication Sig Note   acyclovir (ZOVIRAX) 400 MG tablet TAKE 1 TABLET TWICE DAILY    albuterol (VENTOLIN HFA) 108 (90 Base) MCG/ACT inhaler Inhale 2 puffs into the lungs every 6 (six) hours as needed for wheezing or shortness of breath.    amitriptyline (ELAVIL) 50 MG tablet TAKE 1 TABLET AT BEDTIME    ASCORBIC ACID PO Take 1 tablet by mouth daily.     aspirin EC 81 MG tablet Take 81 mg by mouth daily.    Blood Glucose Monitoring Suppl (TRUE METRIX AIR GLUCOSE METER) w/Device KIT     busPIRone (BUSPAR) 5 MG tablet Take 1 tablet (5 mg total) by mouth 2 (two) times daily as needed (anxiety).  carvedilol (COREG) 6.25 MG tablet Take 1 tablet (6.25 mg total) by mouth 2 (two) times daily.    Cholecalciferol 1000 UNITS capsule Take 1 capsule (1,000 Units total) by mouth 2 (two) times daily.    clotrimazole (LOTRIMIN) 1 % cream For athlete's feet, apply to both feet and between toes twice daily for 4 weeks.    clotrimazole-betamethasone  (LOTRISONE) cream Apply 1 application topically 2 (two) times daily. For 7 days maximum for severe jock itch . Stop if worsening symptoms. 12/03/2019: Unknown start date    escitalopram (LEXAPRO) 5 MG tablet Take 1 tablet (5 mg total) by mouth daily.    famotidine (PEPCID) 20 MG tablet TAKE 1 TABLET TWICE DAILY    finasteride (PROSCAR) 5 MG tablet TAKE 1 TABLET EVERY DAY    furosemide (LASIX) 20 MG tablet Take 1 tablet (20 mg total) by mouth daily.    gabapentin (NEURONTIN) 300 MG capsule Take 1 capsule (300 mg total) by mouth 2 (two) times daily.    glucose blood (TRUE METRIX BLOOD GLUCOSE TEST) test strip TEST BLOOD SUGAR EVERY DAY    Multiple Vitamin (MULTIVITAMIN) capsule Take 1 capsule by mouth daily.      mupirocin ointment (BACTROBAN) 2 % Apply to right foot ulcer once daily and cover with dressing.    NONFORMULARY OR COMPOUNDED ITEM [facility]  Peripheral Neuropathy Cream- Bupivacaine 1%, Doxepin 3%, Gabapentin 6%, Pentoxifylline 3%, Topiramate 1% Apply 1-2 grams to affected area 3-4 times daily Qty. 120 gm 3 refills    omeprazole (PRILOSEC) 20 MG capsule TAKE 1 CAPSULE DAILY 30 TO 60 MINUTES BEFORE FIRST MEAL OF THE DAY (Patient not taking: Reported on 06/03/2020)    rosuvastatin (CRESTOR) 10 MG tablet Take 1 tablet (10 mg total) by mouth daily.    sacubitril-valsartan (ENTRESTO) 49-51 MG Take 1 tablet by mouth 2 (two) times daily.    sodium chloride (MURO 128) 5 % ophthalmic solution Place 1 drop into the left eye at bedtime.    tamsulosin (FLOMAX) 0.4 MG CAPS capsule TAKE 1 CAPSULE EVERY DAY    TRUEPLUS LANCETS 28G MISC Use to test blood sugars two times daily. Dx: E11.9    No facility-administered encounter medications on file as of 06/17/2020.   Patient Care Team    Relationship Specialty Notifications Start End  [name], MD PCP - General Family Medicine  06/12/14    Comment: Wanda Plump, Aloha Gell, MD PCP - Cardiology Cardiology  12/26/19     Marilynne Halsted, MD Referring Physician Ophthalmology  07/03/19   Brunetta Genera, MD Consulting Physician Hematology  07/03/19   Pyrtle, Lajuan Lines, MD Consulting Physician Gastroenterology  07/03/19   Marzetta Board, DPM Consulting Physician Podiatry  07/03/19   Jonnie Finner, RN Oncology Nurse Navigator  All results, Admissions 04/08/20    Comment: Phone:  [phone]   Fax:  [phone]  Madelin Rear, [facility] Pharmacist Pharmacist  05/06/20    Comment: [phone]   Current Diagnosis/Assessment: Goals Addressed   None    Diabetes   A1c goal < ***%  Lab Results  Component Value Date/Time   HGBA1C 5.9 (A) 02/25/2020 01:37 PM   HGBA1C 5.9 (A) 11/07/2019 11:09 AM   HGBA1C 7.1 (H) 10/22/2018 02:51 PM   HGBA1C 7.7 (H) 09/28/2017 02:14 PM   MICROALBUR 60.3 (H) 01/29/2018 12:52 PM   MICROALBUR 7.8 (H) 12/01/2015 09:40 AM    Checking BG: {CHL HP Blood Glucose Monitoring Frequency:6607763896}. Recent FBG readings ***  Previous medications:  Patient is currently {CHL Controlled/Uncontrolled:[phone]} on the following medications:   ***  We discussed: {CHL HP Upstream Pharmacy [phone]}.  Plan  Continue {CHL HP Upstream Pharmacy Plans:[phone]}.   Hypertension   BP goal <130/80  BP Readings from Last 3 Encounters:  06/09/20 137/64  06/03/20 126/72  06/01/20 118/73   Patient checks BP at home {CHL HP BP Monitoring Frequency:[phone]} Patient home BP readings are ranging: ***  Patient is currently {CHL Controlled/Uncontrolled:[phone]} on the following medications:   ***  We discussed {CHL HP Upstream Pharmacy [phone]}.  Plan  Continue {CHL HP Upstream Pharmacy Plans:[phone]}.   COPD and/or tobacco ***    Last spirometry score: *** Gold Grade: {CHL HP Upstream Pharm COPD Gold TKWIO:[id number]} Current COPD Classification:  {CHL HP Upstream Pharm COPD Classification:[phone]} Eosinophil count:   Lab Results   Component Value Date/Time   EOSPCT 1 06/09/2020 08:59 AM                                 Eos (Absolute):  Lab Results  Component Value Date/Time   EOSABS 0.1 06/09/2020 08:59 AM    Patient has failed these meds in past: *** Patient is currently {CHL Controlled/Uncontrolled:[phone]} on the following medications: *** Using maintenance inhaler regularly? {yes/no:20286} Frequency of rescue inhaler use:  {CHL HP Upstream Pharm Inhaler QAST:4196222979}  Tobacco Status:  Social History   Tobacco Use  Smoking Status Former Smoker   Packs/day: 4.00   Years: 20.00   Pack years: 80.00   Types: Cigarettes   Quit date: 09/18/1984   Years since quitting: 35.7  Smokeless Tobacco Never Used  Tobacco Comment   quit in 1980    Patient smokes {Time to first cigarette:23873} Patient triggers include: {Smoking Triggers:23882} On a scale of 1-10, reports MOTIVATION to quit is *** On a scale of 1-10, reports CONFIDENCE in quitting is *** Previous quit attempts included: *** Patient is currently {CHL Controlled/Uncontrolled:[phone]} on the following medications:   ***  We discussed:  {Smoking Cessation Counseling:23883}  Plan  Continue {CHL HP Upstream Pharmacy Plans:[phone]}.  Heart Failure   Type: Systolic Last ejection fraction: 20-25% 11/2019. Patient is currently {CHL Controlled/Uncontrolled:[phone]} on the following medications:   Entresto 49-51 mg twice daily   Carvedilol 6.25 mg twice daily   We discussed diet and exercise extensively and weighing daily; if you gain more than 3 pounds in one day or 5 pounds in one week call your doctor.  Plan  Continue current medications.  Hyperlipidemia   LDL goal < 70  Lipid Panel     Component Value Date/Time   CHOL 120 11/07/2019 1146   TRIG 126.0 11/07/2019 1146   HDL 45.60 11/07/2019 1146   LDLCALC 49 11/07/2019 1146   LDLDIRECT 57.0 10/22/2018 1451    Hepatic Function Latest Ref Rng & Units  06/09/2020 06/01/2020 04/08/2020  Total Protein 6.5 - 8.1 g/dL 6.6 6.8 6.5  Albumin 3.5 - 5.0 g/dL 3.7 3.9 3.4(L)  AST 15 - 41 U/L 26 37 22  ALT 0 - 44 U/L '16 23 10  ' Alk Phosphatase 38 - 126 U/L 104 81 103  Total Bilirubin 0.3 - 1.2 mg/dL 0.4 0.4 0.3  Bilirubin, Direct 0.0 - 0.2 mg/dL - - -    Patient has failed these meds in past: n/a. Patient is currently at goal on the following medications:   Crestor 10 mg once daily  We discussed:  diet and exercise extensively.***  Plan  Continue current medications.  Hypothyroidism   Lab Results  Component Value Date/Time   TSH 4.225 12/04/2019 03:12 AM   TSH 0.824 02/13/2018 03:50 PM   TSH 2.67 12/01/2015 09:40 AM   Patient has failed these meds in past: *** Patient is currently {CHL Controlled/Uncontrolled:[phone]} on the following medications:   ***  We discussed:  {CHL HP Upstream Pharmacy [phone]}.  Plan  Continue {CHL HP Upstream Pharmacy Plans:[phone]}.  GERD   Patient {Actions; denies-reports:120008::"denies"} recent acid reflux.  Currently {CHL Controlled/Uncontrolled:805 839 4524} on:  ***  We discussed: Avoidance of potential triggers such as {causes; exacerbators GERD:13199}.  Plan   Continue {CHL HP Upstream Pharmacy Plans:[phone]}.  Anxiety   Patient has failed these meds in past: *** Patient is currently {CHL Controlled/Uncontrolled:805 839 4524} on the following medications:   Buspirone 5 mg once daily  We discussed:  ***  Plan  Continue {CHL HP Upstream Pharmacy OECXF:[id number]}   Vaccines   Immunization History  Administered Date(s) Administered   Influenza Split 09/07/2011, 07/30/2012   Influenza Whole 10/10/2005, 08/31/2009, 07/10/2010   Influenza, High Dose Seasonal PF 06/22/2016, 06/29/2017, 09/20/2018, 07/11/2019   Influenza,inj,Quad PF,6+ Mos 07/01/2013, 06/20/2014   Influenza-Unspecified 10/26/2015, 09/12/2018   Meningococcal Conjugate 07/24/2014    Moderna SARS-COVID-2 Vaccination 10/22/2019, 12/02/2019   Pneumococcal Conjugate-13 07/24/2014   Pneumococcal Polysaccharide-23 08/31/2009   Td 06/10/2006   Zoster Recombinat (Shingrix) 12/11/2018, 05/03/2019    Reviewed and discussed patient's vaccination history.    Plan  Recommended patient receive *** vaccine in ***.   Medication Management Coordination   Receives prescription medications from:  [facility], [place] [address] Phone: [phone] Fax: [phone]  [facility] [address], [place] [address] [place] [place] [address] Phone: [phone] Fax: [place], [place] [address] Phone: [phone] Fax: [phone]  [facility] [address], [place] [address] Phone: [phone] Fax: [phone]    *** Plan  {US Pharmacy LEZV:47159}. ___________________________ SDOH (Social Determinants of Health) assessments performed: Yes.  Future Appointments  Date Time Provider [place]  06/17/2020 11:00 AM [facility] CCM PHARMACIST [facility] PEC  06/17/2020  1:00 PM AP - ECHO 1 OUTPATIENT [facility]-CARDIOPUL [facility]  06/23/2020  4:15 PM Marzetta Board, DPM [facility]  06/29/2020  3:00 PM WL-MR 1 [facility]-MRI [facility]  07/01/2020  1:30 PM [facility] IR [facility]  07/23/2020  3:30 PM Strader, Fransisco Hertz, PA-C [facility] [place] [facility]  08/10/2020  2:00 PM [facility] LAB [facility] None  08/10/2020  2:40 PM Kale, Cloria [place], MD [facility] None   Visit follow-up:   CPA follow-up: ***.  RPH follow-up: *** month *** visit.  Madelin Rear, Pharm.D., BCGP Clinical Pharmacist [facility] [phone]

## 2020-06-17 ENCOUNTER — Ambulatory Visit (HOSPITAL_COMMUNITY)
Admission: RE | Admit: 2020-06-17 | Discharge: 2020-06-17 | Disposition: A | Discharge status: Home / Self Care | Payer: Medicare HMO | Source: Ambulatory Visit | Attending: Student | Admitting: Student

## 2020-06-17 ENCOUNTER — Other Ambulatory Visit: Payer: Self-pay

## 2020-06-17 ENCOUNTER — Ambulatory Visit: Payer: Medicare HMO

## 2020-06-17 DIAGNOSIS — I5022 Chronic systolic (congestive) heart failure: Secondary | ICD-10-CM | POA: Diagnosis not present

## 2020-06-17 LAB — ECHOCARDIOGRAM COMPLETE
AR max vel: 1.13 cm2
AV Area VTI: 1.23 cm2
AV Area mean vel: 1.15 cm2
AV Mean grad: 16 mmHg
AV Peak grad: 25.8 mmHg
Ao pk vel: 2.54 m/s
Area-P 1/2: 2.26 cm2
Calc EF: 49 %
S' Lateral: 4.34 cm
Single Plane A2C EF: 39.8 %
Single Plane A4C EF: 51.8 %

## 2020-06-17 NOTE — Progress Notes (Signed)
*  PRELIMINARY RESULTS* Echocardiogram 2D Echocardiogram has been performed.  [name] 06/17/2020, 2:01 PM

## 2020-06-23 ENCOUNTER — Encounter: Payer: Self-pay | Admitting: Podiatry

## 2020-06-23 ENCOUNTER — Other Ambulatory Visit: Payer: Self-pay

## 2020-06-23 ENCOUNTER — Ambulatory Visit (INDEPENDENT_AMBULATORY_CARE_PROVIDER_SITE_OTHER): Payer: Medicare HMO | Admitting: Podiatry

## 2020-06-23 DIAGNOSIS — E1142 Type 2 diabetes mellitus with diabetic polyneuropathy: Secondary | ICD-10-CM

## 2020-06-23 DIAGNOSIS — Z8631 Personal history of diabetic foot ulcer: Secondary | ICD-10-CM

## 2020-06-25 DIAGNOSIS — J449 Chronic obstructive pulmonary disease, unspecified: Secondary | ICD-10-CM | POA: Diagnosis not present

## 2020-06-25 DIAGNOSIS — J9611 Chronic respiratory failure with hypoxia: Secondary | ICD-10-CM | POA: Diagnosis not present

## 2020-06-26 NOTE — Progress Notes (Signed)
Subjective:  Patient ID: [name], male    DOB: Aug 27, 1941,  MRN: [id number]  79 y.o. male is seen for follow up ulcer submet head 1 right foot.  Prescribed wound care regimen: Mupirocin Ointment dressing daily.  Patient is wearing diabetic shoes with new offloaded insoles.  Patient relates wound has improved and he feels no discomfort  Patient The patient is having no constitutional symptoms, denying fever, chills, anorexia, or weight loss.  He states he has seen his Cardiologist and is still being worked up for his liver cancer.  Past Medical History:  Diagnosis Date  . Benign prostatic hypertrophy   . CHF (congestive heart failure) ([place])    a. EF 20-25% by echo in 11/2019  . COPD (chronic obstructive pulmonary disease) ([place]) December 15, 2009   FEV1 2.30 (70%) ratio 63 no better with B2 and DLCO 18/6 (73%) corrects to 106%  . Degenerative joint disease   . Depression   . Diverticulosis   . Essential hypertension    ACE inhibitor cough  . Fatty liver   . Hyperlipidemia   . Internal hemorrhoids   . Low back pain   . Otitis externa 07/30/2012  . Splenomegaly   . Type 2 diabetes mellitus ([place])   . Ulcer of foot ([place])    Right foot     Past Surgical History:  Procedure Laterality Date  . IR EMBO TUMOR ORGAN ISCHEMIA INFARCT INC GUIDE ROADMAPPING  06/01/2020  . IR RADIOLOGIST EVAL & MGMT  04/28/2020  . NO PAST SURGERIES  1980     Current Outpatient Medications on File Prior to Visit  Medication Sig Dispense Refill  . acyclovir (ZOVIRAX) 400 MG tablet TAKE 1 TABLET TWICE DAILY 180 tablet 1  . albuterol (VENTOLIN HFA) 108 (90 Base) MCG/ACT inhaler Inhale 2 puffs into the lungs every 6 (six) hours as needed for wheezing or shortness of breath. 6.7 g 3  . amitriptyline (ELAVIL) 50 MG tablet TAKE 1 TABLET AT BEDTIME 90 tablet 1  . ASCORBIC ACID PO Take 1 tablet by mouth daily.     Marland Kitchen aspirin EC 81 MG tablet Take 81 mg by mouth daily.    . Blood Glucose Monitoring Suppl  (TRUE METRIX AIR GLUCOSE METER) w/Device KIT     . busPIRone (BUSPAR) 5 MG tablet Take 1 tablet (5 mg total) by mouth 2 (two) times daily as needed (anxiety). 60 tablet 5  . carvedilol (COREG) 6.25 MG tablet Take 1 tablet (6.25 mg total) by mouth 2 (two) times daily. 180 tablet 3  . Cholecalciferol 1000 UNITS capsule Take 1 capsule (1,000 Units total) by mouth 2 (two) times daily.    . clotrimazole (LOTRIMIN) 1 % cream For athlete's feet, apply to both feet and between toes twice daily for 4 weeks. 45 g 1  . clotrimazole-betamethasone (LOTRISONE) cream Apply 1 application topically 2 (two) times daily. For 7 days maximum for severe jock itch . Stop if worsening symptoms. 45 g 1  . escitalopram (LEXAPRO) 5 MG tablet Take 1 tablet (5 mg total) by mouth daily. 90 tablet 5  . famotidine (PEPCID) 20 MG tablet TAKE 1 TABLET TWICE DAILY 180 tablet 1  . finasteride (PROSCAR) 5 MG tablet TAKE 1 TABLET EVERY DAY 90 tablet 1  . furosemide (LASIX) 20 MG tablet Take 1 tablet (20 mg total) by mouth daily. 90 tablet 1  . gabapentin (NEURONTIN) 300 MG capsule Take 1 capsule (300 mg total) by mouth 2 (two) times daily. 180 capsule  3  . glucose blood (TRUE METRIX BLOOD GLUCOSE TEST) test strip TEST BLOOD SUGAR EVERY DAY 100 strip 1  . Multiple Vitamin (MULTIVITAMIN) capsule Take 1 capsule by mouth daily.      . mupirocin ointment (BACTROBAN) 2 % Apply to right foot ulcer once daily and cover with dressing. 30 g 1  . NONFORMULARY OR COMPOUNDED ITEM [facility]  Peripheral Neuropathy Cream- Bupivacaine 1%, Doxepin 3%, Gabapentin 6%, Pentoxifylline 3%, Topiramate 1% Apply 1-2 grams to affected area 3-4 times daily Qty. 120 gm 3 refills    . omeprazole (PRILOSEC) 20 MG capsule TAKE 1 CAPSULE DAILY 30 TO 60 MINUTES BEFORE FIRST MEAL OF THE DAY (Patient not taking: Reported on 06/03/2020) 90 capsule 1  . rosuvastatin (CRESTOR) 10 MG tablet Take 1 tablet (10 mg total) by mouth daily. 90 tablet 3  .  sacubitril-valsartan (ENTRESTO) 49-51 MG Take 1 tablet by mouth 2 (two) times daily. 180 tablet 3  . sodium chloride (MURO 128) 5 % ophthalmic solution Place 1 drop into the left eye at bedtime.    . tamsulosin (FLOMAX) 0.4 MG CAPS capsule TAKE 1 CAPSULE EVERY DAY 90 capsule 1  . TRUEPLUS LANCETS 28G MISC Use to test blood sugars two times daily. Dx: E11.9 200 each 3   No current facility-administered medications on file prior to visit.     No Known Allergies   Family History  Problem Relation Age of Onset  . Melanoma Mother   . Stroke Father   . CAD Brother        Premature disease     Social History   Occupational History  . Occupation: retired    Comment: maintenence work  Tobacco Use  . Smoking status: Former Smoker    Packs/day: 4.00    Years: 20.00    Pack years: 80.00    Types: Cigarettes    Quit date: 09/18/1984    Years since quitting: 35.7  . Smokeless tobacco: Never Used  . Tobacco comment: quit in 1980  Vaping Use  . Vaping Use: Never used  Substance and Sexual Activity  . 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 Drug use: No  . Sexual activity: Yes     Immunization History  Administered Date(s) Administered  . Influenza Split 09/07/2011, 07/30/2012  . Influenza Whole 10/10/2005, 08/31/2009, 07/10/2010  . Influenza, High Dose Seasonal PF 06/22/2016, 06/29/2017, 09/20/2018, 07/11/2019  . Influenza,inj,Quad PF,6+ Mos 07/01/2013, 06/20/2014  . Influenza-Unspecified 10/26/2015, 09/12/2018  . Meningococcal Conjugate 07/24/2014  . Moderna SARS-COVID-2 Vaccination 10/22/2019, 12/02/2019  . Pneumococcal Conjugate-13 07/24/2014  . Pneumococcal Polysaccharide-23 08/31/2009  . Td 06/10/2006  . Zoster Recombinat (Shingrix) 12/11/2018, 05/03/2019   Objective:  Physical Exam: There were no vitals filed for this visit.   [name] is a pleasant 79 y.o. Caucasian male in NAD. AAO x 3.  Vascular Examination: Neurovascular status  unchanged b/l lower extremities. Capillary refill time to digits immediate b/l. Palpable pedal pulses b/l LE. Pedal hair absent. Lower extremity skin temperature gradient within normal limits. No  pain with calf compression b/l. No edema noted b/l lower extremities.   Dermatological Examination: Pedal skin with normal turgor, texture and tone bilaterally. No open wounds bilaterally. No interdigital macerations bilaterally. Toenails 1-5 b/l elongated, discolored, dystrophic, thickened, crumbly with subungual debris and tenderness to dorsal palpation. Diffuse scaling noted peripherally and plantarly b/l feet..  No interdigital macerations.  No blisters, no weeping. No signs of secondary bacterial  infection noted.  Wound Location: submet head 1 right foot:     There is a minimal amount of devitalized tissue present in the wound. Predebridement Wound Measurement:  1.0 x 0.5 x 0 cm. Postdebridement Wound Measurement: completely healed. Wound Base: epithelialized Peri-wound: Normal Exudate: None: wound tissue dry Blood Loss during debridement: 0 cc('s). Material in wound which inhibits healing/promotes adjacent tissue breakdown: N/A Description of tissue removed from ulceration today:  nonviable hyperkeratosis. Sign(s) of clinical bacterial infection: no clinical signs of infection noted on examination today.  Musculoskeletal Examination: Normal muscle strength 5/5 to all lower extremity muscle groups bilaterally. No pain crepitus or joint limitation noted with ROM b/l. Hammertoes noted to the 1-5 bilaterally.  Neurological Examination: Protective sensation diminished with 10g monofilament b/l. Clonus negative b/l.  Hemoglobin A1C Latest Ref Rng & Units 02/25/2020 11/07/2019  HGBA1C 4.0 - 5.6 % 5.9(A) 5.9(A)  Some recent data might be hidden    Lab Results  Component Value Date   WBC 4.1 06/09/2020   HGB 10.9 (L) 06/09/2020   HCT 33.5 (L) 06/09/2020   MCV 102.1 (H) 06/09/2020   PLT 73  (L) 06/09/2020    Assessment:   1. Healed diabetic foot ulcer   2. 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  -Patient/POA/Family member educated on diagnosis and treatment plan of routine ulcer debridement/wound care.  -Ulceration debridement achieved utilizing sharp excisional debridement with sterile scalpel blade.  -Type/amount of devitalized tissue removed: devitalized hyperkeratosis -Today's ulcer size post-debridement: completely healed. -He has new diabetic shoes with custom insoles. -Wound responded well to today's debridement. -Patient risk factors affecting healing of ulcer: diabetes, diabetic neuropathy, debility -Patient to report any pedal injuries to medical professional immediately. -Patient/POA to call should there be question/concern in the interim.  Return in about 5 weeks (around 07/28/2020) for painful mycotic toenails, diabetic foot care, preulcer right submet head 1.  Marzetta Board, DPM

## 2020-06-29 ENCOUNTER — Ambulatory Visit (HOSPITAL_COMMUNITY)
Admission: RE | Admit: 2020-06-29 | Discharge: 2020-06-29 | Disposition: A | Discharge status: Home / Self Care | Payer: Medicare HMO | Source: Ambulatory Visit | Attending: Interventional Radiology | Admitting: Interventional Radiology

## 2020-06-29 ENCOUNTER — Encounter (HOSPITAL_COMMUNITY): Payer: Self-pay

## 2020-06-29 ENCOUNTER — Other Ambulatory Visit: Payer: Self-pay

## 2020-06-29 DIAGNOSIS — C220 Liver cell carcinoma: Secondary | ICD-10-CM

## 2020-07-01 ENCOUNTER — Telehealth: Payer: Medicare HMO

## 2020-07-07 ENCOUNTER — Other Ambulatory Visit: Payer: Self-pay

## 2020-07-07 ENCOUNTER — Ambulatory Visit (HOSPITAL_COMMUNITY)
Admission: RE | Admit: 2020-07-07 | Discharge: 2020-07-07 | Disposition: A | Discharge status: Home / Self Care | Payer: Medicare HMO | Source: Ambulatory Visit | Attending: Interventional Radiology | Admitting: Interventional Radiology

## 2020-07-07 DIAGNOSIS — C220 Liver cell carcinoma: Secondary | ICD-10-CM | POA: Diagnosis not present

## 2020-07-07 DIAGNOSIS — R161 Splenomegaly, not elsewhere classified: Secondary | ICD-10-CM | POA: Diagnosis not present

## 2020-07-07 DIAGNOSIS — I700 Atherosclerosis of aorta: Secondary | ICD-10-CM | POA: Diagnosis not present

## 2020-07-07 DIAGNOSIS — K7689 Other specified diseases of liver: Secondary | ICD-10-CM | POA: Diagnosis not present

## 2020-07-07 DIAGNOSIS — K7460 Unspecified cirrhosis of liver: Secondary | ICD-10-CM | POA: Diagnosis not present

## 2020-07-07 MED ORDER — GADOBUTROL 1 MMOL/ML IV SOLN
9.0000 mL | Freq: Once | INTRAVENOUS | Status: AC | PRN
  Administered 2020-07-07: 9 mL via INTRAVENOUS

## 2020-07-14 ENCOUNTER — Telehealth: Payer: Self-pay

## 2020-07-14 NOTE — Telephone Encounter (Signed)
This pt has asked to talk to the manager. I told him I would send a message to the manager and she would call back

## 2020-07-15 ENCOUNTER — Ambulatory Visit
Admission: RE | Admit: 2020-07-15 | Discharge: 2020-07-15 | Disposition: A | Discharge status: Home / Self Care | Payer: Medicare HMO | Source: Ambulatory Visit | Attending: Interventional Radiology | Admitting: Interventional Radiology

## 2020-07-15 ENCOUNTER — Other Ambulatory Visit: Payer: Self-pay

## 2020-07-15 ENCOUNTER — Encounter: Payer: Self-pay | Admitting: *Deleted

## 2020-07-15 DIAGNOSIS — I7092 Chronic total occlusion of artery of the extremities: Secondary | ICD-10-CM | POA: Diagnosis not present

## 2020-07-15 DIAGNOSIS — K7689 Other specified diseases of liver: Secondary | ICD-10-CM | POA: Diagnosis not present

## 2020-07-15 DIAGNOSIS — C220 Liver cell carcinoma: Secondary | ICD-10-CM | POA: Diagnosis not present

## 2020-07-15 HISTORY — PX: IR RADIOLOGIST EVAL & MGMT: IMG5224

## 2020-07-15 NOTE — Progress Notes (Signed)
Patient ID: [name], male   DOB: [birth year], 79 y.o.   MRN: [id number]         Chief Complaint: [facility]  Referring Physician(s): Brunetta Genera  History of Present Illness:  [name] is a 79 y.o. 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     The indeterminate liver lesion was then further evaluated with abdominal MRI performed on 03/30/2020 which confirmed the presence of an approximately 3.7 x 2.6 cm lesion within the dome of the medial segment of the left lobe of the liver which demonstrated imaging characteristics diagnostic of a hepatocellular carcinoma.  (Note, an additional question punctate lesion within the right lobe of the liver was not definitively identified abdominal MRI and is likely too small for further characterization at this time.)   Given patient's multiple medical core morbidities he is not felt to be a surgical candidate and as such was evaluated for percutaneous management of this instantly discovered hepatocellular carcinoma at the IR clinic on 04/28/2020.    At that time, the decision was made to proceed with bland hepatic embolization as the lesion was greater than 3 cm in diameter however attempted bland embolization performed on 06/01/2020 demonstrated vaso-occlusive arterial disease precluding intervention.  As such, decision was made to proceed with surveillance imaging and patient is again seen in telemedicine consultation following acquisition of  abdominal MRI performed 07/07/2020.  The patient is accompanied on the phone call by his wife.   Patient remains asymptomatic in regards to this initially discovered liver lesion.  Specifically, no change in appetite or energy level.  No abdominal pain.  No yellowing of the skin or eyes.  No bloody or melanotic stools.  No increased abdominal girth.   From a functional standpoint, the patient is able to ambulate with an assistive device though admits he is unable to walk long distances secondary to shortness of breath.  He lives at home with his wife who he relies on for assistance with ADLs.   Past Medical History:  Diagnosis Date   Benign prostatic hypertrophy    CHF (congestive heart failure) ([place])    a. EF 20-25% by echo in 11/2019   COPD (chronic obstructive pulmonary disease) ([place]) December 15, 2009   FEV1 2.30 (70%) ratio 63 no better with B2 and DLCO 18/6 (73%) corrects to 106%   Degenerative joint disease    Depression    Diverticulosis    Essential hypertension    ACE inhibitor cough   Fatty liver    Hyperlipidemia    Internal hemorrhoids    Low back pain    Otitis externa 07/30/2012   Splenomegaly    Type 2 diabetes mellitus ([place])    Ulcer of foot ([place])    Right foot    Past Surgical History:  Procedure Laterality Date   IR EMBO TUMOR ORGAN ISCHEMIA INFARCT INC GUIDE ROADMAPPING  06/01/2020   IR RADIOLOGIST EVAL & MGMT  04/28/2020   NO PAST SURGERIES  1980    Allergies: Patient has no known allergies.  Medications: Prior  to Admission medications   Medication Sig Start Date End Date Taking? Authorizing Provider  acyclovir (ZOVIRAX) 400 MG tablet TAKE 1 TABLET TWICE DAILY 01/13/20   [name], MD  albuterol (VENTOLIN HFA) 108 (90 Base) MCG/ACT inhaler Inhale 2 puffs into the lungs every 6 (six) hours as needed for wheezing or shortness of breath. 11/29/19   [name], MD  amitriptyline (ELAVIL) 50 MG tablet TAKE 1 TABLET AT BEDTIME 03/05/20   [name], MD  ASCORBIC ACID PO Take 1 tablet by mouth daily.     [provider]  aspirin EC 81 MG tablet Take 81 mg by mouth daily.    [provider]  Blood Glucose Monitoring Suppl (TRUE METRIX AIR GLUCOSE METER) w/Device KIT  08/01/18   [provider]  busPIRone (BUSPAR) 5 MG tablet Take 1 tablet (5 mg total) by mouth 2 (two) times daily as needed (anxiety). 02/25/20   [name], MD  carvedilol (COREG) 6.25 MG tablet Take 1 tablet (6.25 mg total) by mouth 2 (two) times daily. 01/28/20   Strader, Fransisco Hertz, PA-C  Cholecalciferol 1000 UNITS capsule Take 1 capsule (1,000 Units total) by mouth 2 (two) times daily. 05/19/11   Parrett, Fonnie Mu, NP  clotrimazole (LOTRIMIN) 1 % cream For athlete's feet, apply to both feet and between toes twice daily for 4 weeks. 05/15/20   Marzetta Board, DPM  clotrimazole-betamethasone (LOTRISONE) cream Apply 1 application topically 2 (two) times daily. For 7 days maximum for severe jock itch . Stop if worsening symptoms. 11/07/19   [name], MD  escitalopram (LEXAPRO) 5 MG tablet Take 1 tablet (5 mg total) by mouth daily. 01/07/20   [name], MD  famotidine (PEPCID) 20 MG tablet TAKE 1 TABLET TWICE DAILY 03/05/20   [name], MD  finasteride (PROSCAR) 5 MG tablet TAKE 1 TABLET EVERY DAY 03/05/20   [name], MD  furosemide (LASIX) 20 MG tablet Take 1 tablet (20 mg total) by mouth daily. 01/16/20 06/03/20  [name], MD  gabapentin (NEURONTIN) 300 MG capsule Take 1 capsule (300 mg total) by mouth 2 (two) times daily. 12/12/19   [name], MD  glucose blood (TRUE METRIX BLOOD GLUCOSE TEST) test strip TEST BLOOD SUGAR EVERY DAY 11/05/19   [name], MD  Multiple Vitamin (MULTIVITAMIN) capsule Take 1 capsule by mouth daily.      [provider]  mupirocin ointment (BACTROBAN) 2 % Apply to right foot ulcer once daily and cover with dressing. 02/18/20   Marzetta Board, DPM    NONFORMULARY OR COMPOUNDED ITEM [facility]  Peripheral Neuropathy Cream- Bupivacaine 1%, Doxepin 3%, Gabapentin 6%, Pentoxifylline 3%, Topiramate 1% Apply 1-2 grams to affected area 3-4 times daily Qty. 120 gm 3 refills 02/20/17   [provider]  omeprazole (PRILOSEC) 20 MG capsule TAKE 1 CAPSULE DAILY 30 TO 60 MINUTES BEFORE FIRST MEAL OF THE DAY Patient not taking: Reported on 06/03/2020 03/05/20   [name], MD  rosuvastatin (CRESTOR) 10 MG tablet Take 1 tablet (10 mg total) by mouth daily. 01/07/20   [name], MD  sacubitril-valsartan (ENTRESTO) 49-51 MG Take 1 tablet by mouth 2 (two) times daily. 04/03/20   Strader, Fransisco Hertz, PA-C  sodium chloride (MURO 128) 5 % ophthalmic solution Place 1 drop into the left eye at bedtime.    [provider]  tamsulosin (FLOMAX) 0.4 MG CAPS capsule TAKE 1 CAPSULE EVERY  DAY 03/05/20   [name], MD  TRUEPLUS LANCETS 28G MISC Use to test blood sugars two times daily. Dx: E11.9 07/30/18   [name], MD     Family History  Problem Relation Age of Onset   Melanoma Mother    Stroke Father    CAD Brother        Premature disease    Social History   Socioeconomic History   Marital status: Married    Spouse name: Not on file   Number of children: Not on file   Years of education: Not on file   Highest education level: Not on file  Occupational History   Occupation: retired    Comment: maintenence work  Tobacco Use   Smoking status: Former Smoker    Packs/day: 4.00    Years: 20.00    Pack years: 80.00    Types: Cigarettes    Quit date: 09/18/1984    Years since quitting: 35.8   Smokeless tobacco: Never Used   Tobacco comment: quit in 1980  Vaping Use   Vaping Use: Never used  Substance and Sexual Activity   Alcohol use: No    Comment: no drinking since 30 years plus   Drug use: No   Sexual activity: Yes  Other Topics Concern   Not on file  Social History Narrative   Married 53  years in 2015. 4 kids (2 sons) and 3 grandkids.    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            Social Determinants of Radio broadcast assistant Strain:    Difficulty of Paying Living Expenses: Not on file  Food Insecurity:    Worried About Charity fundraiser in the Last Year: Not on file   [place] in the Last Year: Not on file  Transportation Needs:    Film/video editor (Medical): Not on file   Lack of Transportation (Non-Medical): Not on file  Physical Activity:    Days of Exercise per Week: Not on file   Minutes of Exercise per Session: Not on file  Stress:    Feeling of Stress : Not on file  Social Connections:    Frequency of Communication with Friends and Family: Not on file   Frequency of Social Gatherings with Friends and Family: Not on file   Attends Religious Services: Not on file   Active Member of Clubs or Organizations: Not on file   Attends Archivist Meetings: Not on file   Marital Status: Not on file    ECOG Status: 2 - Symptomatic, <50% confined to bed  Review of Systems  Review of Systems: A 12 point ROS discussed and pertinent positives are indicated in the HPI above.  All other systems are negative.  Physical Exam No direct physical exam was performed (except for noted visual exam findings with Video Visits).   Vital Signs: There were no vitals taken for this visit.  Imaging: MR ABDOMEN WWO CONTRAST  Result Date: 07/08/2020 CLINICAL DATA:  Cirrhosis, follow-up LI-RADS category 5 liver lesion EXAM: MRI ABDOMEN WITHOUT AND WITH CONTRAST TECHNIQUE: Multiplanar multisequence MR imaging of the abdomen was performed both before and after the administration of intravenous contrast. CONTRAST:  85m GADAVIST GADOBUTROL 1 MMOL/ML IV SOLN COMPARISON:  MR abdomen, 03/27/2020 FINDINGS: Lower chest: No acute  findings. Hepatobiliary: Significant  interval increase in size of a subcapsular mass of the lateral anterior left lobe of the liver, hepatic segment IVA, now measuring approximately 4.6 x 3.2 cm, previously 3.5 x 2.8 cm (series 31, image 29). This lesion features a region of vivid early arterial hyperenhancement inferiorly, but is predominantly characterized by hypoenhancement to liver parenchyma with capsular enhancement. No additional lesions are identified. There is mild hypertrophy of the left lobe and caudate without overt morphologic stigmata of cirrhosis. Pancreas: No mass, inflammatory changes, or other parenchymal abnormality identified. Spleen:  Splenomegaly, maximum coronal span 15.8 cm. Adrenals/Urinary Tract: No masses identified. No evidence of hydronephrosis. Stomach/Bowel: Visualized portions within the abdomen are unremarkable. Vascular/Lymphatic: No pathologically enlarged lymph nodes identified. Severe, irregular aortic atherosclerosis. No abdominal aortic aneurysm demonstrated. Other:  None. Musculoskeletal: No suspicious bone lesions identified. IMPRESSION: 1. Significant interval increase in size of a subcapsular mass of the lateral anterior left lobe of the liver, hepatic segment IVA, now measuring approximately 4.6 x 3.2 cm, previously 3.5 x 2.8 cm. 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 2. No additional suspicious lesions or contrast enhancement are identified. 3. Mild hypertrophy of the left lobe of the liver and caudate without overt morphologic stigmata of cirrhosis. 4. Splenomegaly. Electronically Signed   By: Eddie Candle M.D.   On: 07/08/2020 12:20   ECHOCARDIOGRAM COMPLETE  Result Date: 06/17/2020    ECHOCARDIOGRAM REPORT   Patient Name:   CORNEL WERBER Date of Exam: 06/17/2020 Medical Rec #:  [id number]      Height:       72.0 in Accession #:    [id number]     Weight:       201.1 lb Date of Birth:  July 30, 1941      BSA:          2.136 m Patient Age:    65 years        BP:           114/57 mmHg Patient Gender: M              HR:           77 bpm. Exam Location:  [facility] Procedure: 2D Echo, Cardiac Doppler and Color Doppler Indications:    Congestive Heart Failure 428.0 / I50.9  History:        Patient has prior history of Echocardiogram examinations, most                 recent 12/04/2019. CHF, COPD; Risk Factors:Hypertension, Diabetes                 and Dyslipidemia.  Sonographer:    Alvino Chapel RCS Referring Phys: [id number] [place]  1. Left ventricular ejection fraction, by estimation, is 50 to 55%. The left ventricle has low normal function. The left ventricle has no regional wall motion abnormalities. There is mild left ventricular hypertrophy. Left ventricular diastolic parameters are consistent with Grade I diastolic dysfunction (impaired relaxation). Elevated left atrial pressure.  2. Right ventricular systolic function is normal. The right ventricular size is normal.  3. Left atrial size was severely dilated.  4. The mitral valve is normal in structure. No evidence of mitral valve regurgitation. No evidence of mitral stenosis.  5. The aortic valve was not well visualized. There is moderate calcification of the aortic valve. There is moderate thickening of the aortic valve. Aortic valve regurgitation is not visualized. Mild to moderate aortic  valve stenosis. Aortic valve mean gradient measures 16.0 mmHg. Aortic valve peak gradient measures 25.8 mmHg. Aortic valve area, by VTI measures 1.23 cm.  6. The inferior vena cava is normal in size with greater than 50% respiratory variability, suggesting right atrial pressure of 3 mmHg. FINDINGS  Left Ventricle: Left ventricular ejection fraction, by estimation, is 50 to 55%. The left ventricle has low normal function. The left ventricle has no regional wall motion abnormalities. The left ventricular internal cavity size was normal in size. There is mild left ventricular hypertrophy. Left ventricular  diastolic parameters are consistent with Grade I diastolic dysfunction (impaired relaxation). Elevated left atrial pressure. Right Ventricle: The right ventricular size is normal. No increase in right ventricular wall thickness. Right ventricular systolic function is normal. Left Atrium: Left atrial size was severely dilated. Right Atrium: Right atrial size was not well visualized. Pericardium: There is no evidence of pericardial effusion. Mitral Valve: The mitral valve is normal in structure. No evidence of mitral valve regurgitation. No evidence of mitral valve stenosis. Tricuspid Valve: The tricuspid valve is normal in structure. Tricuspid valve regurgitation is not demonstrated. No evidence of tricuspid stenosis. Aortic Valve: The aortic valve was not well visualized. There is moderate calcification of the aortic valve. There is moderate thickening of the aortic valve. There is moderate aortic valve annular calcification. Aortic valve regurgitation is not visualized. Mild to moderate aortic stenosis is present. Aortic valve mean gradient measures 16.0 mmHg. Aortic valve peak gradient measures 25.8 mmHg. Aortic valve area, by VTI measures 1.23 cm. Pulmonic Valve: The pulmonic valve was not well visualized. Pulmonic valve regurgitation is not visualized. No evidence of pulmonic stenosis. Aorta: The aortic root is normal in size and structure. Pulmonary Artery: Indeterminant PASP, inadequate TR jet. Venous: The inferior vena cava is normal in size with greater than 50% respiratory variability, suggesting right atrial pressure of 3 mmHg. IAS/Shunts: The interatrial septum was not well visualized.  LEFT VENTRICLE PLAX 2D LVIDd:         5.56 cm      Diastology LVIDs:         4.34 cm      LV e' medial:    4.13 cm/s LV PW:         1.17 cm      LV E/e' medial:  18.6 LV IVS:        1.22 cm      LV e' lateral:   6.53 cm/s LVOT diam:     2.00 cm      LV E/e' lateral: 11.7 LV SV:         70 LV SV Index:   33 LVOT Area:      3.14 cm  LV Volumes (MOD) LV vol d, MOD A2C: 95.5 ml LV vol d, MOD A4C: 113.0 ml LV vol s, MOD A2C: 57.5 ml LV vol s, MOD A4C: 54.5 ml LV SV MOD A2C:     38.0 ml LV SV MOD A4C:     113.0 ml LV SV MOD BP:      53.6 ml RIGHT VENTRICLE RV S prime:     12.20 cm/s TAPSE (M-mode): 1.6 cm LEFT ATRIUM             Index       RIGHT ATRIUM           Index LA diam:        2.90 cm 1.36 cm/m  RA Area:     17.30 cm  LA Vol (A2C):   67.3 ml 31.51 ml/m RA Volume:   47.30 ml  22.15 ml/m LA Vol (A4C):   97.3 ml 45.56 ml/m LA Biplane Vol: 81.5 ml 38.16 ml/m  AORTIC VALVE AV Area (Vmax):    1.13 cm AV Area (Vmean):   1.15 cm AV Area (VTI):     1.23 cm AV Vmax:           254.00 cm/s AV Vmean:          190.000 cm/s AV VTI:            0.567 m AV Peak Grad:      25.8 mmHg AV Mean Grad:      16.0 mmHg LVOT Vmax:         91.40 cm/s LVOT Vmean:        69.400 cm/s LVOT VTI:          0.222 m LVOT/AV VTI ratio: 0.39  AORTA Ao Root diam: 3.80 cm MITRAL VALVE MV Area (PHT): 2.26 cm     SHUNTS MV Decel Time: 335 msec     Systemic VTI:  0.22 m MV E velocity: 76.70 cm/s   Systemic Diam: 2.00 cm MV A velocity: 106.00 cm/s MV E/A ratio:  0.72 Carlyle Dolly MD Electronically signed by Carlyle Dolly MD Signature Date/Time: 06/17/2020/4:02:27 PM    Final     Labs:  CBC: Recent Labs    01/22/20 1412 04/08/20 1447 06/01/20 0815 06/09/20 0859  WBC 11.8* 4.7 4.5 4.1  HGB 10.8* 10.8* 11.6* 10.9*  HCT 32.7* 33.9* 34.6* 33.5*  PLT 101.0* 120* 85* 73*    COAGS: Recent Labs    12/04/19 0312 04/08/20 1446 06/01/20 0815  INR 1.1 1.1 1.1    BMP: Recent Labs    03/17/20 1335 04/08/20 1447 06/01/20 0815 06/09/20 0859  NA 141 143 139 141  K 4.4 4.5 4.6 5.1  CL 105 108 105 107  CO2 _0 GLUCOSE 128* 159* 126* 175*  BUN 14 12 26* 22  CALCIUM 9.0 8.8* 9.0 10.0  CREATININE 0.93 1.14 1.37* 1.33*  GFRNONAA >60 >60 49* 50*  GFRAA >60 >60 56* 59*    LIVER FUNCTION TESTS: Recent Labs    01/22/20 1412  04/08/20 1447 06/01/20 0815 06/09/20 0859  BILITOT 0.7 0.3 0.4 0.4  AST 19 22 37 26  ALT _1 ALKPHOS 80 103 81 104  PROT 6.0 6.5 6.8 6.6  ALBUMIN 3.7 3.4* 3.9 3.7    TUMOR MARKERS:     Assessment and Plan:  [name] is a 79 y.o. 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  He remains asymptomatic in regards to this incidentally discovered liver lesion.  Personal review of abdominal MRI performed 07/07/2020 it demonstrates enlargement of the worrisome lesion within the dome of the medial segment of the left lobe of the liver, currently measuring 4.2 x 3.3 x 2.6 cm, previously, 3.7 x 2.5 x 2.2 cm.  Previously noted trace amount of perihepatic ascites has resolved in the interval.  The portal vein remains patent  Patient's bilirubin remains within normal limits while his creatinine is mildly elevated above baseline at 1.3 (obtained on 06/09/2020).  Prolonged conversation were held with the patient and the patient's wife regarding potential treatment options including the following:  - Ablation - I explained that as the lesion has continued to enlarge it is again not ideal for percutaneous ablation (as the lesion is greater than 3 cm).  Additionally, there is interposition of the transverse colon rendering percutaneous access more challenging.    - Bland embolization - I explained that we could consider bland embolization from either a left radial or brachial artery approach though as he has  significant vaso-occlusive disease affecting his lower extremities, I would be concerned about the patency of his arch vessels and if desired would initially proceed with acquisition of a contrast-enhanced chest CTA.  I also explained that intervention from the upper extremity poses a higher risk of stroke than a lower extremity approach.  - Systemic therapy - I explained that systemic therapies for HCC have proven to be less effective than liver directed therapy though I would defer to Dr. Grier Mitts expertise.  - XRT - Following today's consultation, I discussed the above treatment options with Dr. Irene Limbo who proposed the idea of XRT to the lesion.  I think that as this is less invasive than a repeat attempt at bland embolization, this is an excellent suggestion and agree with referral to Radiation Oncology for their input.   PLAN: - Refer to Radiation Oncology for consideration of XRT to the enlarging liver lesion. - IF pt is NOT deemed an XRT candidate I would then proceed with obtaining a CTA of the chest for planning purposes for potential repeat attempt of a hepatic bland embolization from a left upper extremity approach.   I discussed the above POC with the patient today (10/8) via the telephone.  The patient demonstrated excellent understanding of the above and knows to call the interventional radiology clinic with any interval questions or concerns.   A copy of this report was sent to the requesting provider on this date.  Electronically Signed: Sandi Mariscal 07/15/2020, 1:07 PM   I spent a total of 25 Minutes in remote  clinical consultation, greater than 50% of which was counseling/coordinating care for percutaneous management of hepatocellular carcinoma.    Visit type: Audio only (telephone). Audio (no video) only due to patient's lack of internet/smartphone capability. Alternative for in-person consultation at [facility], [address], [place], [place]. This visit type was  conducted due to national recommendations for restrictions regarding the COVID-19 Pandemic (e.g. social distancing).  This format is felt to be most appropriate for this patient at this time.  All issues noted in this document were discussed and addressed.

## 2020-07-17 ENCOUNTER — Other Ambulatory Visit: Payer: Self-pay | Admitting: Hematology

## 2020-07-17 DIAGNOSIS — C220 Liver cell carcinoma: Secondary | ICD-10-CM

## 2020-07-17 NOTE — Progress Notes (Signed)
Discussed with Dr Wallie Renshaw-- not a good candidate for TACE or percutaenous ablation. Will refer to radiation oncology for consideration of RT.  [name]

## 2020-07-21 ENCOUNTER — Ambulatory Visit
Admission: RE | Admit: 2020-07-21 | Discharge: 2020-07-21 | Disposition: A | Discharge status: Home / Self Care | Payer: Medicare HMO | Source: Ambulatory Visit | Attending: Radiation Oncology | Admitting: Radiation Oncology

## 2020-07-21 ENCOUNTER — Encounter: Payer: Self-pay | Admitting: Radiation Oncology

## 2020-07-21 ENCOUNTER — Other Ambulatory Visit: Payer: Self-pay

## 2020-07-21 DIAGNOSIS — C220 Liver cell carcinoma: Secondary | ICD-10-CM | POA: Diagnosis not present

## 2020-07-21 NOTE — Progress Notes (Signed)
Radiation Oncology         [phone] ________________________________  Initial Outpatient Consultation - Conducted via telephone due to current COVID-19 concerns for limiting patient exposure  I spoke with the patient to conduct this consult visit via telephone to spare the patient unnecessary potential exposure in the healthcare setting during the current COVID-19 pandemic. The patient was notified in advance and was offered a [facility] [place] meeting to allow for face to face communication but unfortunately reported that they did not have the appropriate resources/technology to support such a visit and instead preferred to proceed with a telephone consult.    Name: [name]        MRN: [id number]  Date of Service: 07/21/2020 DOB: [birth year]  VP:[id number], Joshua Mars, MD  Joshua Genera, MD     REFERRING PHYSICIAN: Brunetta Genera, MD   DIAGNOSIS: The encounter diagnosis was Hepatocellular carcinoma [facility]).   HISTORY OF PRESENT ILLNESS: [name] is a 79 y.o. 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 The patient was being evaluated in February 2021 in the ED for shortness of breath. He was found on CTPA to have a lesion in the kidney. No embolism was present but he had stigmata of pulmonary edema, cardiomegaly, atherosclerotic disease and emphysema. Follow up of this with CT abdomen  pelvis on 02/14/20 revealed a cirrhotic appearing liver with a  4 cm hypervascular mass in the left hepatic lobe and an 8 mm hypervascular nodule in the posterior right hepatic lobe. A right renal cyst was seen but no other findings of concern. An MRI of the abdomen with and without contrast on 03/27/20 revealed a mass in the medial aspect of the left liver that measured 3.7 x 2.6 cm with LI-RADs score of 5. AFP levels on multiple occasions have showing steady increase though still within normal range. He was seen by interventional  radiology and it was felt that he would be a candidate for bland embolization but the procedure was aborted on 06/01/20 due to atherosclerotic plaque in the femoral/iliac vessels. Repeat MRI on 07/07/20 showed interval increase in the dominant lesion measuring 4.6 x 3.2 cm, and no other lesions were seen. He's contacted today to discuss options of stereotactic body radiotherapy (SBRT) to the lesion.     PREVIOUS RADIATION THERAPY: No   PAST MEDICAL HISTORY:  Past Medical History:  Diagnosis Date   Benign prostatic hypertrophy    CHF (congestive heart failure) ([place])    a. EF 20-25% by echo in 11/2019   COPD (chronic obstructive pulmonary disease) ([place]) December 15, 2009   FEV1 2.30 (70%) ratio 63 no better with B2 and DLCO 18/6 (73%) corrects to 106%   Degenerative joint disease    Depression    Diverticulosis    Essential hypertension    ACE inhibitor cough   Fatty liver    Hyperlipidemia    Internal hemorrhoids    Low back pain    Otitis externa 07/30/2012   Splenomegaly    Type 2 diabetes mellitus ([place])    Ulcer of foot ([place])    Right foot       PAST SURGICAL HISTORY: Past Surgical History:  Procedure Laterality Date   IR EMBO TUMOR ORGAN ISCHEMIA INFARCT INC GUIDE ROADMAPPING  06/01/2020   IR RADIOLOGIST EVAL & MGMT  04/28/2020   IR RADIOLOGIST EVAL & MGMT  07/15/2020   NO PAST SURGERIES  1980     FAMILY HISTORY:  Family History  Problem Relation Age of Onset   Melanoma Mother    Stroke Father    CAD Brother        Premature disease     SOCIAL HISTORY:  reports that he quit smoking about 35 years ago. His smoking use included cigarettes. He has a 80.00 pack-year smoking history. He has never used smokeless tobacco. He reports that he does not drink alcohol and does not use drugs. The patient is married and lives in [place].    ALLERGIES: Patient has no known allergies.   MEDICATIONS:  Current Outpatient Medications  Medication Sig Dispense  Refill   acyclovir (ZOVIRAX) 400 MG tablet TAKE 1 TABLET TWICE DAILY 180 tablet 1   albuterol (VENTOLIN HFA) 108 (90 Base) MCG/ACT inhaler Inhale 2 puffs into the lungs every 6 (six) hours as needed for wheezing or shortness of breath. 6.7 g 3   amitriptyline (ELAVIL) 50 MG tablet TAKE 1 TABLET AT BEDTIME 90 tablet 1   ASCORBIC ACID PO Take 1 tablet by mouth daily.      aspirin EC 81 MG tablet Take 81 mg by mouth daily.     Blood Glucose Monitoring Suppl (TRUE METRIX AIR GLUCOSE METER) w/Device KIT      busPIRone (BUSPAR) 5 MG tablet Take 1 tablet (5 mg total) by mouth 2 (two) times daily as needed (anxiety). 60 tablet 5   carvedilol (COREG) 6.25 MG tablet Take 1 tablet (6.25 mg total) by mouth 2 (two) times daily. 180 tablet 3   Cholecalciferol 1000 UNITS capsule Take 1 capsule (1,000 Units total) by mouth 2 (two) times daily.     clotrimazole (LOTRIMIN) 1 % cream For athlete's feet, apply to both feet and between toes twice daily for 4 weeks. 45 g 1   escitalopram (LEXAPRO) 5 MG tablet Take 1 tablet (5 mg total) by mouth daily. 90 tablet 5   famotidine (PEPCID) 20 MG tablet TAKE 1 TABLET TWICE DAILY 180 tablet 1   finasteride (PROSCAR) 5 MG tablet TAKE 1 TABLET EVERY DAY 90 tablet 1   gabapentin (NEURONTIN) 300 MG capsule Take 1 capsule (300 mg total) by mouth 2 (two) times daily. 180 capsule 3   glucose blood (TRUE METRIX BLOOD GLUCOSE TEST) test strip TEST BLOOD SUGAR EVERY DAY 100 strip 1   Multiple Vitamin (MULTIVITAMIN) capsule Take 1 capsule by mouth daily.       mupirocin ointment (BACTROBAN) 2 % Apply to right foot ulcer once daily and cover with dressing. 30 g 1   NONFORMULARY OR COMPOUNDED ITEM [facility]  Peripheral Neuropathy Cream- Bupivacaine 1%, Doxepin 3%, Gabapentin 6%, Pentoxifylline 3%, Topiramate 1% Apply 1-2 grams to affected area 3-4 times daily Qty. 120 gm 3 refills     omeprazole (PRILOSEC) 20 MG capsule TAKE 1 CAPSULE DAILY 30 TO 60 MINUTES  BEFORE FIRST MEAL OF THE DAY 90 capsule 1   rosuvastatin (CRESTOR) 10 MG tablet Take 1 tablet (10 mg total) by mouth daily. 90 tablet 3   sodium chloride (MURO 128) 5 % ophthalmic solution Place 1 drop into the left eye at bedtime.     tamsulosin (FLOMAX) 0.4 MG CAPS capsule TAKE 1 CAPSULE EVERY DAY 90 capsule 1   TRUEPLUS LANCETS 28G MISC Use to test blood sugars two times daily. Dx: E11.9 200 each 3   clotrimazole-betamethasone (LOTRISONE) cream Apply 1 application topically 2 (two) times daily. For 7 days maximum for severe jock itch .  Stop if worsening symptoms. (Patient not taking: Reported on 07/21/2020) 45 g 1   furosemide (LASIX) 20 MG tablet Take 1 tablet (20 mg total) by mouth daily. 90 tablet 1   sacubitril-valsartan (ENTRESTO) 49-51 MG Take 1 tablet by mouth 2 (two) times daily. (Patient not taking: Reported on 07/21/2020) 180 tablet 3   No current facility-administered medications for this encounter.     REVIEW OF SYSTEMS: On review of systems, the patient reports that he is doing well overall. He denies any chest pain, shortness of breath, cough, fevers, chills, night sweats, unintended weight changes. He reports chronic low back, hip, and knee pain from arthritis. He denies any bowel or bladder disturbances, and denies abdominal pain, nausea or vomiting. He denies any jaundice, pruritis, or icteris. He denies any new musculoskeletal or joint aches or pains. A complete review of systems is obtained and is otherwise negative.     PHYSICAL EXAM:  Unable to assess due to encounter.   ECOG = 0  0 - Asymptomatic (Fully active, able to carry on all predisease activities without restriction)  1 - Symptomatic but completely ambulatory (Restricted in physically strenuous activity but ambulatory and able to carry out work of a light or sedentary nature. For example, light housework, office work)  2 - Symptomatic, <50% in bed during the day (Ambulatory and capable of all self  care but unable to carry out any work activities. Up and about more than 50% of waking hours)  3 - Symptomatic, >50% in bed, but not bedbound (Capable of only limited self-care, confined to bed or chair 50% or more of waking hours)  4 - Bedbound (Completely disabled. Cannot carry on any self-care. Totally confined to bed or chair)  5 - Death   Eustace Pen MM, Creech RH, Tormey DC, et al. [phone]). "Toxicity and response criteria of the [facility]". [place] Oncol. 5 (6): 649-55    LABORATORY DATA:  Lab Results  Component Value Date   WBC 4.1 06/09/2020   HGB 10.9 (L) 06/09/2020   HCT 33.5 (L) 06/09/2020   MCV 102.1 (H) 06/09/2020   PLT 73 (L) 06/09/2020   Lab Results  Component Value Date   NA 141 06/09/2020   K 5.1 06/09/2020   CL 107 06/09/2020   CO2 28 06/09/2020   Lab Results  Component Value Date   ALT 16 06/09/2020   AST 26 06/09/2020   ALKPHOS 104 06/09/2020   BILITOT 0.4 06/09/2020      RADIOGRAPHY: MR ABDOMEN WWO CONTRAST  Result Date: 07/08/2020 CLINICAL DATA:  Cirrhosis, follow-up LI-RADS category 5 liver lesion EXAM: MRI ABDOMEN WITHOUT AND WITH CONTRAST TECHNIQUE: Multiplanar multisequence MR imaging of the abdomen was performed both before and after the administration of intravenous contrast. CONTRAST:  76m GADAVIST GADOBUTROL 1 MMOL/ML IV SOLN COMPARISON:  MR abdomen, 03/27/2020 FINDINGS: Lower chest: No acute findings. Hepatobiliary: Significant interval increase in size of a subcapsular mass of the lateral anterior left lobe of the liver, hepatic segment IVA, now measuring approximately 4.6 x 3.2 cm, previously 3.5 x 2.8 cm (series 31, image 29). This lesion features a region of vivid early arterial hyperenhancement inferiorly, but is predominantly characterized by hypoenhancement to liver parenchyma with capsular enhancement. No additional lesions are identified. There is mild hypertrophy of the left lobe and caudate without overt  morphologic stigmata of cirrhosis. Pancreas: No mass, inflammatory changes, or other parenchymal abnormality identified. Spleen:  Splenomegaly, maximum coronal span 15.8 cm. Adrenals/Urinary Tract: No  masses identified. No evidence of hydronephrosis. Stomach/Bowel: Visualized portions within the abdomen are unremarkable. Vascular/Lymphatic: No pathologically enlarged lymph nodes identified. Severe, irregular aortic atherosclerosis. No abdominal aortic aneurysm demonstrated. Other:  None. Musculoskeletal: No suspicious bone lesions identified. IMPRESSION: 1. Significant interval increase in size of a subcapsular mass of the lateral anterior left lobe of the liver, hepatic segment IVA, now measuring approximately 4.6 x 3.2 cm, previously 3.5 x 2.8 cm. 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 2. No additional suspicious lesions or contrast enhancement are identified. 3. Mild hypertrophy of the left lobe of the liver and caudate without overt morphologic stigmata of cirrhosis. 4. Splenomegaly. Electronically Signed   By: Eddie Candle M.D.   On: 07/08/2020 12:20   IR Radiologist Eval & Mgmt  Result Date: 07/15/2020 Please refer to notes tab for details about interventional procedure. (Op Note)      IMPRESSION/PLAN: 1. Hepatocellular Carcinoma. Dr. Lisbeth Renshaw discusses the imaging findings including diagnostic criteria for hepatocellular carcinoma. He reviews the patient's course and would recommend a course of stereotactic body radiotherapy (SBRT) to the large left liver lesion. Dr. Lisbeth Renshaw would prefer to have fiducial markers placed at the base and lateral aspect of the lesion prior to proceeding.  We discussed the risks, benefits, short, and long term effects of radiotherapy, and the patient is interested in proceeding. Dr. Lisbeth Renshaw discusses the delivery and logistics of radiotherapy and anticipates a course of 5 fractions of radiotherapy. We will coordinate fiducial  placement with Dr. Pascal Lux and simulation once we know when his fiducials can be placed.   Given current concerns for patient exposure during the COVID-19 pandemic, this encounter was conducted via telephone.  The patient has provided two factor identification and has given verbal consent for this type of encounter and has been advised to only accept a meeting of this type in a secure network environment. The time spent during this encounter was 45 minutes including preparation, discussion, and coordination of the patient's care. The attendants for this meeting include Dr. Lisbeth Renshaw, Hayden Pedro  and [name].  During the encounter,  Dr. Lisbeth Renshaw, and Hayden Pedro were located at [facility] Radiation Oncology Department.  [name] was located at home.    The above documentation reflects my direct findings during this shared patient visit. Please see the separate note by Dr. Lisbeth Renshaw on this date for the remainder of the patient's plan of care.    Carola Rhine, PAC

## 2020-07-22 ENCOUNTER — Other Ambulatory Visit: Payer: Self-pay | Admitting: Family Medicine

## 2020-07-22 ENCOUNTER — Other Ambulatory Visit: Payer: Self-pay | Admitting: Radiation Oncology

## 2020-07-22 DIAGNOSIS — C220 Liver cell carcinoma: Secondary | ICD-10-CM

## 2020-07-23 ENCOUNTER — Encounter: Payer: Self-pay | Admitting: Student

## 2020-07-23 ENCOUNTER — Ambulatory Visit: Payer: Medicare HMO | Admitting: Radiation Oncology

## 2020-07-23 ENCOUNTER — Ambulatory Visit: Payer: Medicare HMO | Admitting: Student

## 2020-07-23 ENCOUNTER — Other Ambulatory Visit: Payer: Self-pay

## 2020-07-23 VITALS — BP 136/62 | HR 80 | Ht 72.0 in | Wt 205.6 lb

## 2020-07-23 DIAGNOSIS — E785 Hyperlipidemia, unspecified: Secondary | ICD-10-CM | POA: Diagnosis not present

## 2020-07-23 DIAGNOSIS — I10 Essential (primary) hypertension: Secondary | ICD-10-CM | POA: Diagnosis not present

## 2020-07-23 DIAGNOSIS — I350 Nonrheumatic aortic (valve) stenosis: Secondary | ICD-10-CM

## 2020-07-23 DIAGNOSIS — N1831 Chronic kidney disease, stage 3a: Secondary | ICD-10-CM | POA: Diagnosis not present

## 2020-07-23 DIAGNOSIS — I5022 Chronic systolic (congestive) heart failure: Secondary | ICD-10-CM

## 2020-07-23 NOTE — Patient Instructions (Signed)
Medication Instructions:  Your physician recommends that you continue on your current medications as directed. Please refer to the Current Medication list given to you today.  *If you need a refill on your cardiac medications before your next appointment, please call your pharmacy*   Lab Work: NONE   If you have labs (blood work) drawn today and your tests are completely normal, you will receive your results only by: MyChart Message (if you have MyChart) OR A paper copy in the mail If you have any lab test that is abnormal or we need to change your treatment, we will call you to review the results.   Testing/Procedures: NONE    Follow-Up: At [facility], you and your health needs are our priority.  As part of our continuing mission to provide you with exceptional heart care, we have created designated Provider Care Teams.  These Care Teams include your primary Cardiologist (physician) and Advanced Practice Providers (APPs -  Physician Assistants and Nurse Practitioners) who all work together to provide you with the care you need, when you need it.  We recommend signing up for the patient portal called "MyChart".  Sign up information is provided on this After Visit Summary.  MyChart is used to connect with patients for Virtual Visits (Telemedicine).  Patients are able to view lab/test results, encounter notes, upcoming appointments, etc.  Non-urgent messages can be sent to your provider as well.   To learn more about what you can do with MyChart, go to [email or web address].    Your next appointment:   3-4 month(s)  The format for your next appointment:   In Person  Provider:   Samuel McDowell, MD or Brittany Strader, PA-C   Other Instructions Thank you for choosing [facility]!    

## 2020-07-23 NOTE — Progress Notes (Signed)
Cardiology Office Note    Date:  07/23/2020   ID:  [name], DOB 1941/05/31, MRN [id number]  PCP:  [name], MD  Cardiologist: Rozann Lesches, MD    Chief Complaint  Patient presents with  . Follow-up    2 month visit    History of Present Illness:    [name] is a 79 y.o. male with past medical history of chronic systolic CHF (EF 96-78% by echo in 11/2019), HTN, HLD, Type 2 DM, Stage 3 CKD, liver cirrhosis with recently diagnosed hepatocellular carcinoma, thrombocytopenia and COPD who presents to the office today for 16-monthfollow-up.  He was last examined myself in 05/2020 and reported his breathing had improved following initiation of Entresto. He denied any chest pain or palpitations. A follow-up echocardiogram was recommended for reassessment of his EF. This was performed in 06/2020 and showed his EF had improved to 50 to 55% with no regional motion normalities. He did have mild LVH and grade 1 diastolic dysfunction along with mild to moderate aortic stenosis.  In talking with the patient and his wife today, he reports feeling fatigued and questions if this is due to not sleeping well at night as he reports his "mind races" due to concerns regarding his liver cancer. He is suppose to start radiation within the next few weeks.  He says that his breathing has been stable and he denies any acute changes in his weight at home. No recent orthopnea, PND or lower extremity edema. He denies any chest pain or palpitations.   Past Medical History:  Diagnosis Date  . Benign prostatic hypertrophy   . CHF (congestive heart failure) ([place]    a. EF 20-25% by echo in 11/2019  . COPD (chronic obstructive pulmonary disease) ([place] December 15, 2009   FEV1 2.30 (70%) ratio 63 no better with B2 and DLCO 18/6 (73%) corrects to 106%  . Degenerative joint disease   . Depression   . Diverticulosis   . Essential hypertension    ACE inhibitor cough  . Fatty liver   .  Hyperlipidemia   . Internal hemorrhoids   . Low back pain   . Otitis externa 07/30/2012  . Splenomegaly   . Type 2 diabetes mellitus ([place]   . [place]cer of foot ([place]    Right foot    Past Surgical History:  Procedure Laterality Date  . IR EMBO TUMOR ORGAN ISCHEMIA INFARCT INC GUIDE ROADMAPPING  06/01/2020  . IR RADIOLOGIST EVAL & MGMT  04/28/2020  . IR RADIOLOGIST EVAL & MGMT  07/15/2020  . NO PAST SURGERIES  1980    Current Medications: Outpatient Medications Prior to Visit  Medication Sig Dispense Refill  . acyclovir (ZOVIRAX) 400 MG tablet TAKE 1 TABLET TWICE DAILY 180 tablet 1  . albuterol (VENTOLIN HFA) 108 (90 Base) MCG/ACT inhaler Inhale 2 puffs into the lungs every 6 (six) hours as needed for wheezing or shortness of breath. 6.7 g 3  . amitriptyline (ELAVIL) 50 MG tablet TAKE 1 TABLET AT BEDTIME 90 tablet 1  . ASCORBIC ACID PO Take 1 tablet by mouth daily.     .Marland Kitchenaspirin EC 81 MG tablet Take 81 mg by mouth daily.    . Blood Glucose Monitoring Suppl (TRUE METRIX AIR GLUCOSE METER) w/Device KIT     . busPIRone (BUSPAR) 5 MG tablet Take 1 tablet (5 mg total) by mouth 2 (two) times daily as needed (anxiety). 60 tablet 5  . carvedilol (COREG) 6.25 MG  tablet Take 1 tablet (6.25 mg total) by mouth 2 (two) times daily. 180 tablet 3  . Cholecalciferol 1000 UNITS capsule Take 1 capsule (1,000 Units total) by mouth 2 (two) times daily.    . clotrimazole (LOTRIMIN) 1 % cream For athlete's feet, apply to both feet and between toes twice daily for 4 weeks. 45 g 1  . clotrimazole-betamethasone (LOTRISONE) cream Apply 1 application topically 2 (two) times daily. For 7 days maximum for severe jock itch . Stop if worsening symptoms. 45 g 1  . escitalopram (LEXAPRO) 5 MG tablet Take 1 tablet (5 mg total) by mouth daily. 90 tablet 5  . famotidine (PEPCID) 20 MG tablet TAKE 1 TABLET TWICE DAILY 180 tablet 1  . finasteride (PROSCAR) 5 MG tablet TAKE 1 TABLET EVERY DAY 90 tablet 1  . furosemide (LASIX)  20 MG tablet Take 1 tablet (20 mg total) by mouth daily. 90 tablet 1  . gabapentin (NEURONTIN) 300 MG capsule Take 1 capsule (300 mg total) by mouth 2 (two) times daily. 180 capsule 3  . glucose blood (TRUE METRIX BLOOD GLUCOSE TEST) test strip TEST BLOOD SUGAR EVERY DAY 100 strip 1  . Multiple Vitamin (MULTIVITAMIN) capsule Take 1 capsule by mouth daily.      . mupirocin ointment (BACTROBAN) 2 % Apply to right foot ulcer once daily and cover with dressing. 30 g 1  . NONFORMULARY OR COMPOUNDED ITEM [facility]  Peripheral Neuropathy Cream- Bupivacaine 1%, Doxepin 3%, Gabapentin 6%, Pentoxifylline 3%, Topiramate 1% Apply 1-2 grams to affected area 3-4 times daily Qty. 120 gm 3 refills    . omeprazole (PRILOSEC) 20 MG capsule TAKE 1 CAPSULE DAILY 30 TO 60 MINUTES BEFORE FIRST MEAL OF THE DAY 90 capsule 1  . rosuvastatin (CRESTOR) 10 MG tablet Take 1 tablet (10 mg total) by mouth daily. 90 tablet 3  . sacubitril-valsartan (ENTRESTO) 49-51 MG Take 1 tablet by mouth 2 (two) times daily. 180 tablet 3  . sodium chloride (MURO 128) 5 % ophthalmic solution Place 1 drop into the left eye at bedtime.    . tamsulosin (FLOMAX) 0.4 MG CAPS capsule TAKE 1 CAPSULE EVERY DAY 90 capsule 1  . TRUEPLUS LANCETS 28G MISC Use to test blood sugars two times daily. Dx: E11.9 200 each 3   No facility-administered medications prior to visit.     Allergies:   Patient has no known allergies.   Social History   Socioeconomic History  . Marital status: Married    Spouse name: Not on file  . Number of children: Not on file  . Years of education: Not on file  . Highest education level: Not on file  Occupational History  . Occupation: retired    Comment: maintenence work  Tobacco Use  . Smoking status: Former Smoker    Packs/day: 4.00    Years: 20.00    Pack years: 80.00    Types: Cigarettes    Quit date: 09/18/1984    Years since quitting: 35.8  . Smokeless tobacco: Never Used  . Tobacco comment:  quit in 1980  Vaping Use  . Vaping Use: Never used  Substance and Sexual Activity  . 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 Drug use: No  . Sexual activity: Yes  Other Topics Concern  . Not on file  Social History Narrative   Married 53 years in 2015. 4 kids (2 sons) and 3 grandkids.    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 Difficulty of Paying Living Expenses: Not on file  Food Insecurity:   . Worried About Charity fundraiser in the Last Year: Not on file  . Ran Out of Food in the Last Year: Not on file  Transportation Needs:   . Lack of Transportation (Medical): Not on file  . Lack of Transportation (Non-Medical): Not on file  Physical Activity:   . Days of Exercise per Week: Not on file  . Minutes of Exercise per Session: Not on file  Stress:   . Feeling of Stress : Not on file  Social Connections:   . Frequency of Communication with Friends and Family: Not on file  . Frequency of Social Gatherings with Friends and Family: Not on file  . Attends Religious Services: Not on file  . Active Member of Clubs or Organizations: Not on file  . Attends Archivist Meetings: Not on file  . Marital Status: Not on file     Family History:  The patient's family history includes CAD in his brother; Melanoma in his mother; Stroke in his father.   Review of Systems:   Please see the history of present illness.     General:  No chills, fever, night sweats or weight changes.  Cardiovascular:  No chest pain, edema, orthopnea, palpitations, paroxysmal nocturnal dyspnea. Positive for dyspnea on exertion (at baseline).  Dermatological: No rash, lesions/masses Respiratory: No cough, dyspnea Urologic: No hematuria, dysuria Abdominal:   No nausea, vomiting, diarrhea, bright red blood per  rectum, melena, or hematemesis Neurologic:  No visual changes, wkns, changes in mental status. All other systems reviewed and are otherwise negative except as noted above.   Physical Exam:    VS:  BP 136/62   Pulse 80   Ht 6' (1.829 m)   Wt 205 lb 9.6 oz (93.3 kg)   SpO2 96%   BMI 27.88 kg/m    General: Well developed, elderly male appearing in no acute distress. Head: Normocephalic, atraumatic. Neck: No carotid bruits. JVD not elevated.  Lungs: Respirations regular and unlabored, without wheezes or rales.  Heart: Regular rate and rhythm. No S3 or S4.  2/6 SEM along RUSB.  Abdomen: Appears non-distended. No obvious abdominal masses. Msk:  Strength and tone appear normal for age. No obvious joint deformities or effusions. Extremities: No clubbing or cyanosis. Trace lower extremity edema.  Distal pedal pulses are 2+ bilaterally. Neuro: Alert and oriented X 3. Moves all extremities spontaneously. No focal deficits noted. Psych:  Responds to questions appropriately with a normal affect. Skin: No rashes or lesions noted  Wt Readings from Last 3 Encounters:  07/23/20 205 lb 9.6 oz (93.3 kg)  06/09/20 201 lb 1.6 oz (91.2 kg)  06/03/20 201 lb 12.8 oz (91.5 kg)     Studies/Labs Reviewed:   EKG:  EKG is not ordered today.   Recent Labs: 12/03/2019: B Natriuretic Peptide 927.6 12/04/2019: Magnesium 1.3; TSH 4.225 06/09/2020: ALT 16; BUN 22; Creatinine 1.33; Hemoglobin 10.9; Platelet Count 73; Potassium 5.1; Sodium 141   Lipid Panel    Component Value Date/Time   CHOL 120 11/07/2019 1146   TRIG 126.0 11/07/2019 1146   HDL 45.60 11/07/2019 1146   CHOLHDL 3 11/07/2019 1146  VLDL 25.2 11/07/2019 1146   LDLCALC 49 11/07/2019 1146   LDLDIRECT 57.0 10/22/2018 1451    Additional studies/ records that were reviewed today include:   Echocardiogram: 11/2019 IMPRESSIONS    1. No LV thrombus seen on contrast imaging. Left ventricular ejection  fraction, by estimation, is 20 to 25%.  The left ventricle has severely  decreased function. The left ventricle demonstrates global hypokinesis.  The left ventricular internal cavity  size was mildly dilated. Indeterminate diastolic filling due to E-A  fusion.  2. Right ventricular systolic function is normal. The right ventricular  size is normal. Tricuspid regurgitation signal is inadequate for assessing  PA pressure.  3. The mitral valve is grossly normal. Trivial mitral valve  regurgitation. No evidence of mitral stenosis.  4. The aortic valve is grossly normal. Aortic valve regurgitation is not  visualized. Mild aortic valve stenosis.   Echocardiogram: 06/2020 IMPRESSIONS    1. Left ventricular ejection fraction, by estimation, is 50 to 55%. The  left ventricle has low normal function. The left ventricle has no regional  wall motion abnormalities. There is mild left ventricular hypertrophy.  Left ventricular diastolic  parameters are consistent with Grade I diastolic dysfunction (impaired  relaxation). Elevated left atrial pressure.  2. Right ventricular systolic function is normal. The right ventricular  size is normal.  3. Left atrial size was severely dilated.  4. The mitral valve is normal in structure. No evidence of mitral valve  regurgitation. No evidence of mitral stenosis.  5. The aortic valve was not well visualized. There is moderate  calcification of the aortic valve. There is moderate thickening of the  aortic valve. Aortic valve regurgitation is not visualized. Mild to  moderate aortic valve stenosis. Aortic valve mean  gradient measures 16.0 mmHg. Aortic valve peak gradient measures 25.8  mmHg. Aortic valve area, by VTI measures 1.23 cm.  6. The inferior vena cava is normal in size with greater than 50%  respiratory variability, suggesting right atrial pressure of 3 mmHg.   Assessment:    1. Chronic systolic heart failure ([place])   2. Essential hypertension   3. Hyperlipidemia LDL goal  <70   4. Aortic valve stenosis, etiology of cardiac valve disease unspecified   5. Stage 3a chronic kidney disease ([place])      Plan:   In order of problems listed above:  1. Chronic Systolic CHF  - EF was previously 20-25% by echo in 11/2019 and a catheterization was not pursued in the setting of his recently diagnosed hepatocellular carcinoma. Recent echo showed his EF has improved to 50-55% on medical therapy and his respiratory status has improved over the past few months. He has trace edema on examination today but lungs are clear.  - Continue current medication regimen with Coreg 6.13m BID, Lasix 227mdaily and Entresto 49-5160mID.   2. HTN - BP is well-controlled at 136/62 during today's visit. Continue current medication regimen.   3. HLD - Followed by PCP. He remains on Crestor 79m22mily.   4. Aortic Stenosis - Mild to moderate by most recent echo. Continue to follow.   5. Stage 3 CKD - Creatinine was stable at 1.33 by recent labs in 05/2020.   Medication Adjustments/Labs and Tests Ordered: Current medicines are reviewed at length with the patient today.  Concerns regarding medicines are outlined above.  Medication changes, Labs and Tests ordered today are listed in the Patient Instructions below. Patient Instructions  Medication Instructions:  Your physician recommends that you  continue on your current medications as directed. Please refer to the Current Medication list given to you today.  *If you need a refill on your cardiac medications before your next appointment, please call your pharmacy*   Lab Work: NONE   If you have labs (blood work) drawn today and your tests are completely normal, you will receive your results only by: Marland Kitchen MyChart Message (if you have MyChart) OR . A paper copy in the mail If you have any lab test that is abnormal or we need to change your treatment, we will call you to review the results.   Testing/Procedures: NONE     Follow-Up: At [facility], you and your health needs are our priority.  As part of our continuing mission to provide you with exceptional heart care, we have created designated Provider Care Teams.  These Care Teams include your primary Cardiologist (physician) and Advanced Practice Providers (APPs -  Physician Assistants and Nurse Practitioners) who all work together to provide you with the care you need, when you need it.  We recommend signing up for the patient portal called "MyChart".  Sign up information is provided on this After Visit Summary.  MyChart is used to connect with patients for Virtual Visits (Telemedicine).  Patients are able to view lab/test results, encounter notes, upcoming appointments, etc.  Non-urgent messages can be sent to your provider as well.   To learn more about what you can do with MyChart, go to [email or web address].    Your next appointment:   3-4 month(s)  The format for your next appointment:   In Person  Provider:   Rozann Lesches, MD or Bernerd Pho, PA-C   Other Instructions Thank you for choosing [facility]!       Signed, Erma Heritage, PA-C  07/23/2020 8:24 PM    [place]. [address] [place], [place] [address] Phone: [phone] Fax: [phone]

## 2020-07-25 DIAGNOSIS — J9611 Chronic respiratory failure with hypoxia: Secondary | ICD-10-CM | POA: Diagnosis not present

## 2020-07-25 DIAGNOSIS — J449 Chronic obstructive pulmonary disease, unspecified: Secondary | ICD-10-CM | POA: Diagnosis not present

## 2020-07-27 ENCOUNTER — Encounter (HOSPITAL_COMMUNITY): Payer: Self-pay

## 2020-07-27 ENCOUNTER — Telehealth (HOSPITAL_COMMUNITY): Payer: Self-pay

## 2020-07-27 NOTE — Progress Notes (Unsigned)
[name] Male, 79 y.o., 1940/11/26 MRN:  [id number] Phone:  [phone] Jerilynn Mages) PCP:  [name], MD Coverage:  [facility] Medicare/[employer] Medicare Hmo Next Appt With Radiology ([facility]-US 2) 08/06/2020 at 1:00 PM  RE: Biopsy Received: 3 days ago Message Details  Suttle, Rosanne Ashing, MD  [facility] E Approved for ultrasound guided fiducial marker placement.   [name]   Previous Messages  ----- Message -----  From: Lenore Cordia  Sent: 07/24/2020 11:32 AM EDT  To: Ir Procedure Requests  Subject: Biopsy                      Procedure Requested: US Biopsy (liver)    Reason for Procedure: needs fiducial marker placement for radiation to liver    Provider Requesting: Hayden Pedro  Provider Telephone: [phone]    Other Info:

## 2020-07-28 ENCOUNTER — Ambulatory Visit: Payer: Medicare HMO | Admitting: Podiatry

## 2020-07-28 ENCOUNTER — Encounter: Payer: Self-pay | Admitting: Podiatry

## 2020-07-28 ENCOUNTER — Other Ambulatory Visit: Payer: Self-pay

## 2020-07-28 DIAGNOSIS — E1142 Type 2 diabetes mellitus with diabetic polyneuropathy: Secondary | ICD-10-CM

## 2020-07-28 DIAGNOSIS — M2041 Other hammer toe(s) (acquired), right foot: Secondary | ICD-10-CM

## 2020-07-28 DIAGNOSIS — M79675 Pain in left toe(s): Secondary | ICD-10-CM | POA: Diagnosis not present

## 2020-07-28 DIAGNOSIS — L84 Corns and callosities: Secondary | ICD-10-CM | POA: Diagnosis not present

## 2020-07-28 DIAGNOSIS — M2042 Other hammer toe(s) (acquired), left foot: Secondary | ICD-10-CM

## 2020-07-28 DIAGNOSIS — B351 Tinea unguium: Secondary | ICD-10-CM

## 2020-07-28 DIAGNOSIS — M79674 Pain in right toe(s): Secondary | ICD-10-CM | POA: Diagnosis not present

## 2020-07-29 ENCOUNTER — Other Ambulatory Visit: Payer: Self-pay | Admitting: Family Medicine

## 2020-07-29 ENCOUNTER — Telehealth: Payer: Self-pay | Admitting: Radiation Oncology

## 2020-07-29 NOTE — Telephone Encounter (Signed)
I called the patient today to find out if we can move forward with scheduling his simulation.  He is currently scheduled for fiducial marker placement with ultrasound guidance on 08/06/2020.  He is also scheduled to see Dr. Irene Limbo on 08/10/2020.  He would prefer not to have to be at the cancer center a long time that day, and we have scheduled for him to have simulation on 08/12/2020.  He is encouraged to call if he has any questions or concerns, orders have been placed to reserve time on the simulation schedule and he understands to be here at 12:15 for IV start, and simulation at 1 PM.  We will have updated blood work from his visit on 08/10/2020.

## 2020-07-30 NOTE — Telephone Encounter (Signed)
Spoke to pt. He is upset about a No Show fee. He states he did pay it but would still like to talk to someone as he is going through cancer treatments and must have overlooked an appt.  Please advise

## 2020-08-02 NOTE — Progress Notes (Signed)
Subjective:  Patient ID: [name], male    DOB: July 09, 1941,  MRN: [id number]  79 y.o. male is seen for follow up at risk foot care. He has h/o diabetes and ulceration submet head 1 right foot. Ulceration submet head 1 has healed.  Patient is wearing diabetic shoes with new offloaded insoles. He states foot continues to do well. Patient The patient is having no constitutional symptoms, denying fever, chills, anorexia, or weight loss.  He states he has been scheduled for radiology for marking in order to prepare him for radiation of his lung mass.  Past Medical History:  Diagnosis Date  . Benign prostatic hypertrophy   . CHF (congestive heart failure) ([place])    a. EF 20-25% by echo in 11/2019  . COPD (chronic obstructive pulmonary disease) ([place]) December 15, 2009   FEV1 2.30 (70%) ratio 63 no better with B2 and DLCO 18/6 (73%) corrects to 106%  . Degenerative joint disease   . Depression   . Diverticulosis   . Essential hypertension    ACE inhibitor cough  . Fatty liver   . Hyperlipidemia   . Internal hemorrhoids   . Low back pain   . Otitis externa 07/30/2012  . Splenomegaly   . Type 2 diabetes mellitus ([place])   . Ulcer of foot ([place])    Right foot     Past Surgical History:  Procedure Laterality Date  . IR EMBO TUMOR ORGAN ISCHEMIA INFARCT INC GUIDE ROADMAPPING  06/01/2020  . IR RADIOLOGIST EVAL & MGMT  04/28/2020  . IR RADIOLOGIST EVAL & MGMT  07/15/2020  . NO PAST SURGERIES  1980     Current Outpatient Medications on File Prior to Visit  Medication Sig Dispense Refill  . acyclovir (ZOVIRAX) 400 MG tablet TAKE 1 TABLET TWICE DAILY 180 tablet 1  . albuterol (VENTOLIN HFA) 108 (90 Base) MCG/ACT inhaler Inhale 2 puffs into the lungs every 6 (six) hours as needed for wheezing or shortness of breath. 6.7 g 3  . amitriptyline (ELAVIL) 50 MG tablet TAKE 1 TABLET AT BEDTIME 90 tablet 1  . ASCORBIC ACID PO Take 1 tablet by mouth daily.     Marland Kitchen aspirin EC 81 MG tablet Take 81 mg by  mouth daily.    . Blood Glucose Monitoring Suppl (TRUE METRIX AIR GLUCOSE METER) w/Device KIT     . busPIRone (BUSPAR) 5 MG tablet Take 1 tablet (5 mg total) by mouth 2 (two) times daily as needed (anxiety). 60 tablet 5  . carvedilol (COREG) 6.25 MG tablet Take 1 tablet (6.25 mg total) by mouth 2 (two) times daily. 180 tablet 3  . Cholecalciferol 1000 UNITS capsule Take 1 capsule (1,000 Units total) by mouth 2 (two) times daily.    . clotrimazole (LOTRIMIN) 1 % cream For athlete's feet, apply to both feet and between toes twice daily for 4 weeks. 45 g 1  . clotrimazole-betamethasone (LOTRISONE) cream Apply 1 application topically 2 (two) times daily. For 7 days maximum for severe jock itch . Stop if worsening symptoms. 45 g 1  . escitalopram (LEXAPRO) 5 MG tablet Take 1 tablet (5 mg total) by mouth daily. 90 tablet 5  . famotidine (PEPCID) 20 MG tablet TAKE 1 TABLET TWICE DAILY 180 tablet 1  . finasteride (PROSCAR) 5 MG tablet TAKE 1 TABLET EVERY DAY 90 tablet 1  . gabapentin (NEURONTIN) 300 MG capsule Take 1 capsule (300 mg total) by mouth 2 (two) times daily. 180 capsule 3  . glucose blood (  TRUE METRIX BLOOD GLUCOSE TEST) test strip TEST BLOOD SUGAR EVERY DAY 100 strip 1  . Multiple Vitamin (MULTIVITAMIN) capsule Take 1 capsule by mouth daily.      . mupirocin ointment (BACTROBAN) 2 % Apply to right foot ulcer once daily and cover with dressing. 30 g 1  . NONFORMULARY OR COMPOUNDED ITEM [facility]  Peripheral Neuropathy Cream- Bupivacaine 1%, Doxepin 3%, Gabapentin 6%, Pentoxifylline 3%, Topiramate 1% Apply 1-2 grams to affected area 3-4 times daily Qty. 120 gm 3 refills    . omeprazole (PRILOSEC) 20 MG capsule TAKE 1 CAPSULE DAILY 30 TO 60 MINUTES BEFORE FIRST MEAL OF THE DAY 90 capsule 1  . rosuvastatin (CRESTOR) 10 MG tablet Take 1 tablet (10 mg total) by mouth daily. 90 tablet 3  . sacubitril-valsartan (ENTRESTO) 49-51 MG Take 1 tablet by mouth 2 (two) times daily. 180 tablet 3  .  sodium chloride (MURO 128) 5 % ophthalmic solution Place 1 drop into the left eye at bedtime.    . tamsulosin (FLOMAX) 0.4 MG CAPS capsule TAKE 1 CAPSULE EVERY DAY 90 capsule 1  . TRUEPLUS LANCETS 28G MISC Use to test blood sugars two times daily. Dx: E11.9 200 each 3   No current facility-administered medications on file prior to visit.     No Known Allergies   Family History  Problem Relation Age of Onset  . Melanoma Mother   . Stroke Father   . CAD Brother        Premature disease     Social History   Occupational History  . Occupation: retired    Comment: maintenence work  Tobacco Use  . Smoking status: Former Smoker    Packs/day: 4.00    Years: 20.00    Pack years: 80.00    Types: Cigarettes    Quit date: 09/18/1984    Years since quitting: 35.8  . Smokeless tobacco: Never Used  . Tobacco comment: quit in 1980  Vaping Use  . Vaping Use: Never used  Substance and Sexual Activity  . 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 Drug use: No  . Sexual activity: Yes     Immunization History  Administered Date(s) Administered  . Influenza Split 09/07/2011, 07/30/2012  . Influenza Whole 10/10/2005, 08/31/2009, 07/10/2010  . Influenza, High Dose Seasonal PF 06/22/2016, 06/29/2017, 09/20/2018, 07/11/2019  . Influenza,inj,Quad PF,6+ Mos 07/01/2013, 06/20/2014  . Influenza-Unspecified 10/26/2015, 09/12/2018  . Meningococcal Conjugate 07/24/2014  . Moderna SARS-COVID-2 Vaccination 10/22/2019, 12/02/2019  . Pneumococcal Conjugate-13 07/24/2014  . Pneumococcal Polysaccharide-23 08/31/2009  . Td 06/10/2006  . Zoster Recombinat (Shingrix) 12/11/2018, 05/03/2019   Objective:  Physical Exam: There were no vitals filed for this visit.   [name] is a pleasant 79 y.o. Caucasian male in NAD. AAO x 3.  Vascular Examination: Neurovascular status unchanged b/l lower extremities. Capillary refill time to digits immediate b/l. Palpable pedal pulses b/l  LE. Pedal hair absent. Lower extremity skin temperature gradient within normal limits. No  pain with calf compression b/l. No edema noted b/l lower extremities.   Dermatological Examination: Pedal skin with normal turgor, texture and tone bilaterally. No open wounds bilaterally. No interdigital macerations bilaterally. Toenails 1-5 b/l elongated, discolored, dystrophic, thickened, crumbly with subungual debris and tenderness to dorsal palpation.   Hyperkeratotic lesion submet head 1 right foot. No edema, no erythema, no drainage, no fluctuance.  Musculoskeletal Examination: Normal muscle strength 5/5 to all lower extremity muscle groups bilaterally. No pain  crepitus or joint limitation noted with ROM b/l. Hammertoes noted to the 1-5 bilaterally.  Neurological Examination: Protective sensation diminished with 10g monofilament b/l. Clonus negative b/l.  Hemoglobin A1C Latest Ref Rng & Units 02/25/2020 11/07/2019  HGBA1C 4.0 - 5.6 % 5.9(A) 5.9(A)  Some recent data might be hidden    Lab Results  Component Value Date   WBC 4.1 06/09/2020   HGB 10.9 (L) 06/09/2020   HCT 33.5 (L) 06/09/2020   MCV 102.1 (H) 06/09/2020   PLT 73 (L) 06/09/2020    Assessment:   1. Pain due to onychomycosis of toenails of both feet   2. Callus   3. Acquired hammertoes of both feet   4. Diabetic peripheral neuropathy associated with type 2 diabetes mellitus ([place])    Plan:  -Patient was evaluated and treated and all questions answered.  -Continue diabetic foot care principles. -Toenails 1-5 b/l debrided in length and girth without iatrogenic laceration. -Calluses pared submetatarsal head(s) 1 right foot utilizing sterile scalpel blade without incident. -Continue offloaded diabetic shoes daily. -Patient to report any pedal injuries to medical professional immediately. -Patient/POA to call should there be question/concern in the interim.  Return in about 9 weeks (around 09/29/2020).  Marzetta Board,  DPM

## 2020-08-03 ENCOUNTER — Ambulatory Visit (INDEPENDENT_AMBULATORY_CARE_PROVIDER_SITE_OTHER): Payer: Medicare HMO

## 2020-08-03 DIAGNOSIS — Z0000 Encounter for general adult medical examination without abnormal findings: Secondary | ICD-10-CM | POA: Diagnosis not present

## 2020-08-03 NOTE — Progress Notes (Signed)
Virtual Visit via Telephone Note  I connected with  [name] on 08/03/20 at  1:00 PM EDT by telephone and verified that I am speaking with the correct person using two identifiers.  Medicare Annual Wellness visit completed telephonically due to Covid-19 pandemic.   Persons participating in this call: This Health Coach and this patient.   Location: Patient: Home Provider: Office   I discussed the limitations, risks, security and privacy concerns of performing an evaluation and management service by telephone and the availability of in person appointments. The patient expressed understanding and agreed to proceed.  Unable to perform video visit due to video visit attempted and failed and/or patient does not have video capability.   Some vital signs may be absent or patient reported.   Joshua Brace, LPN    Subjective:   [name] is a 79 y.o. male who presents for Medicare Annual/Subsequent preventive examination.  Review of Systems     Cardiac Risk Factors include: male gender;hypertension;dyslipidemia     Objective:    Today's Vitals   08/03/20 1305  PainSc: 7    There is no height or weight on file to calculate BMI.  Advanced Directives 08/03/2020 07/21/2020 06/09/2020 04/08/2020 12/03/2019 07/03/2019 06/21/2018  Does Patient Have a Medical Advance Directive? Yes _0  No  Does patient want to make changes to medical advance directive? Yes (MAU/Ambulatory/Procedural Areas - Information given) - - - - - -  Would patient like information on creating a medical advance directive? - No - Patient declined No - Patient declined No - Patient declined No - Patient declined Yes (MAU/Ambulatory/Procedural Areas - Information given) No - Patient declined    Current Medications (verified) Outpatient Encounter Medications as of 08/03/2020  Medication Sig  . acyclovir (ZOVIRAX) 400 MG tablet TAKE 1 TABLET TWICE DAILY  . albuterol (VENTOLIN HFA) 108 (90 Base)  MCG/ACT inhaler Inhale 2 puffs into the lungs every 6 (six) hours as needed for wheezing or shortness of breath.  Marland Kitchen amitriptyline (ELAVIL) 50 MG tablet TAKE 1 TABLET AT BEDTIME  . ASCORBIC ACID PO Take 1 tablet by mouth daily.   Marland Kitchen aspirin EC 81 MG tablet Take 81 mg by mouth daily.  . Blood Glucose Monitoring Suppl (TRUE METRIX AIR GLUCOSE METER) w/Device KIT   . busPIRone (BUSPAR) 5 MG tablet Take 1 tablet (5 mg total) by mouth 2 (two) times daily as needed (anxiety).  . carvedilol (COREG) 6.25 MG tablet Take 1 tablet (6.25 mg total) by mouth 2 (two) times daily.  . Cholecalciferol 1000 UNITS capsule Take 1 capsule (1,000 Units total) by mouth 2 (two) times daily.  Marland Kitchen escitalopram (LEXAPRO) 5 MG tablet Take 1 tablet (5 mg total) by mouth daily.  . famotidine (PEPCID) 20 MG tablet TAKE 1 TABLET TWICE DAILY  . finasteride (PROSCAR) 5 MG tablet TAKE 1 TABLET EVERY DAY  . furosemide (LASIX) 20 MG tablet TAKE 1 TABLET (20 MG TOTAL) BY MOUTH DAILY.  Marland Kitchen gabapentin (NEURONTIN) 300 MG capsule Take 1 capsule (300 mg total) by mouth 2 (two) times daily.  Marland Kitchen glucose blood (TRUE METRIX BLOOD GLUCOSE TEST) test strip TEST BLOOD SUGAR EVERY DAY  . Multiple Vitamin (MULTIVITAMIN) capsule Take 1 capsule by mouth daily.    . NONFORMULARY OR COMPOUNDED ITEM [facility]  Peripheral Neuropathy Cream- Bupivacaine 1%, Doxepin 3%, Gabapentin 6%, Pentoxifylline 3%, Topiramate 1% Apply 1-2 grams to affected area 3-4 times daily Qty. 120 gm 3 refills  . omeprazole (  PRILOSEC) 20 MG capsule TAKE 1 CAPSULE DAILY 30 TO 60 MINUTES BEFORE FIRST MEAL OF THE DAY  . rosuvastatin (CRESTOR) 10 MG tablet Take 1 tablet (10 mg total) by mouth daily.  . sacubitril-valsartan (ENTRESTO) 49-51 MG Take 1 tablet by mouth 2 (two) times daily.  . tamsulosin (FLOMAX) 0.4 MG CAPS capsule TAKE 1 CAPSULE EVERY DAY  . TRUEPLUS LANCETS 28G MISC Use to test blood sugars two times daily. Dx: E11.9  . clotrimazole (LOTRIMIN) 1 % cream For  athlete's feet, apply to both feet and between toes twice daily for 4 weeks. (Patient not taking: Reported on 08/03/2020)  . clotrimazole-betamethasone (LOTRISONE) cream Apply 1 application topically 2 (two) times daily. For 7 days maximum for severe jock itch . Stop if worsening symptoms. (Patient not taking: Reported on 08/03/2020)  . mupirocin ointment (BACTROBAN) 2 % Apply to right foot ulcer once daily and cover with dressing. (Patient not taking: Reported on 08/03/2020)  . sodium chloride (MURO 128) 5 % ophthalmic solution Place 1 drop into the left eye at bedtime. (Patient not taking: Reported on 08/03/2020)   No facility-administered encounter medications on file as of 08/03/2020.    Allergies (verified) Patient has no known allergies.   History: Past Medical History:  Diagnosis Date  . Benign prostatic hypertrophy   . CHF (congestive heart failure) ([place])    a. EF 20-25% by echo in 11/2019  . COPD (chronic obstructive pulmonary disease) ([place]) December 15, 2009   FEV1 2.30 (70%) ratio 63 no better with B2 and DLCO 18/6 (73%) corrects to 106%  . Degenerative joint disease   . Depression   . Diverticulosis   . Essential hypertension    ACE inhibitor cough  . Fatty liver   . Hyperlipidemia   . Internal hemorrhoids   . Low back pain   . Otitis externa 07/30/2012  . Splenomegaly   . Type 2 diabetes mellitus ([place])   . Ulcer of foot ([place])    Right foot   Past Surgical History:  Procedure Laterality Date  . IR EMBO TUMOR ORGAN ISCHEMIA INFARCT INC GUIDE ROADMAPPING  06/01/2020  . IR RADIOLOGIST EVAL & MGMT  04/28/2020  . IR RADIOLOGIST EVAL & MGMT  07/15/2020  . NO PAST SURGERIES  1980   Family History  Problem Relation Age of Onset  . Melanoma Mother   . Stroke Father   . CAD Brother        Premature disease   Social History   Socioeconomic History  . Marital status: Married    Spouse name: Not on file  . Number of children: Not on file  . Years of education: Not on  file  . Highest education level: Not on file  Occupational History  . Occupation: retired    Comment: maintenence work  Tobacco Use  . Smoking status: Former Smoker    Packs/day: 4.00    Years: 20.00    Pack years: 80.00    Types: Cigarettes    Quit date: 09/18/1984    Years since quitting: 35.8  . Smokeless tobacco: Never Used  . Tobacco comment: quit in 1980  Vaping Use  . Vaping Use: Never used  Substance and Sexual Activity  . 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 Drug use: No  . Sexual activity: Yes  Other Topics Concern  . Not on file  Social History Narrative   Married 67 years in 2015. 4 kids (2 sons)  and 3 grandkids.    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 Difficulty of Paying Living Expenses: Not hard at all  Food Insecurity: No Food Insecurity  . Worried About Charity fundraiser in the Last Year: Never true  . Ran Out of Food in the Last Year: Never true  Transportation Needs: No Transportation Needs  . Lack of Transportation (Medical): No  . Lack of Transportation (Non-Medical): No  Physical Activity: Inactive  . Days of Exercise per Week: 0 days  . Minutes of Exercise per Session: 0 min  Stress: No Stress Concern Present  . Feeling of Stress : Not at all  Social Connections: Moderately Integrated  . Frequency of Communication with Friends and Family: More than three times a week  . Frequency of Social Gatherings with Friends and Family: Twice a week  . Attends Religious Services: More than 4 times per year  . Active Member of Clubs or Organizations: No  . Attends Archivist Meetings: Never  . Marital Status: Married    Tobacco Counseling Counseling given: Not Answered Comment: quit in 1980   Clinical Intake:  Pre-visit preparation  completed: Yes  Pain : 0-10 Pain Score: 7  Pain Type: Chronic pain Pain Location: Hip (down to ankles) Pain Descriptors / Indicators: Aching, Sore Pain Onset: More than a month ago Pain Frequency: Constant     BMI - recorded: 27.88 Nutritional Status: BMI 25 -29 Overweight Nutritional Risks: None Diabetes: Yes CBG done?:  (134)  How often do you need to have someone help you when you read instructions, pamphlets, or other written materials from your doctor or pharmacy?: 1 - Never  Diabetic?Yes  Interpreter Needed?: No  Information entered by :: Charlott Rakes, LPN   Activities of Daily Living In your present state of health, do you have any difficulty performing the following activities: 08/03/2020 06/01/2020  Hearing? N N  Vision? N N  Difficulty concentrating or making decisions? N N  Walking or climbing stairs? Y Y  Comment related to hip and knees -  Dressing or bathing? Y N  Comment wife assist with dressing related to arthritis in hands -  Doing errands, shopping? N -  Preparing Food and eating ? N -  Using the Toilet? N -  In the past six months, have you accidently leaked urine? N -  Do you have problems with loss of bowel control? N -  Managing your Medications? N -  Managing your Finances? N -  Housekeeping or managing your Housekeeping? N -  Some recent data might be hidden    Patient Care Team: [name], MD as PCP - General (Family Medicine) Satira Sark, MD as PCP - Cardiology (Cardiology) Marilynne Halsted, MD as Referring Physician (Ophthalmology) Brunetta Genera, MD as Consulting Physician (Hematology) Pyrtle, Lajuan Lines, MD as Consulting Physician (Gastroenterology) Marzetta Board, DPM as Consulting Physician (Podiatry) Jonnie Finner, RN as Oncology Nurse Navigator Madelin Rear, [facility] as Pharmacist (Pharmacist)  Indicate any recent Medical Services you may have received from other than [facility] providers in the past year  (date may be approximate).     Assessment:   This is a routine wellness examination for [employer].  Hearing/Vision screen  Hearing Screening   125Hz 250Hz 500Hz 1000Hz 2000Hz 3000Hz 4000Hz 6000Hz 8000Hz  Right ear:           Left ear:           Comments: Pt denies any hearing difficulty at this time  Vision Screening Comments: Pt follows up with Dr Darnell Level for annual eye exams   Dietary issues and exercise activities discussed: Current Exercise Habits: The patient does not participate in regular exercise at present, Exercise limited by: orthopedic condition(s);respiratory conditions(s)  Goals    . patient centered     Wants to fup on pain to be more mobile           . Patient Stated     Get rid of the cancer      Depression Screen PHQ 2/9 Scores 08/03/2020 08/03/2020 02/25/2020 11/07/2019 07/03/2019 10/22/2018 01/29/2018  PHQ - 2 Score 2 0 0 0 0 1 0  PHQ- 9 Score - - 0 1 - 3 -    Fall Risk Fall Risk  08/03/2020 07/03/2019 01/29/2018 01/29/2018 01/29/2018  Falls in the past year? 1 1 No No No  Number falls in past yr: 1 1 - - -  Injury with Fall? 1 1 - - -  Risk for fall due to : Impaired mobility;Impaired balance/gait;Impaired vision;History of fall(s);Orthopedic patient History of fall(s) - - -  Follow up Falls prevention discussed Education provided;Falls prevention discussed;Falls evaluation completed - - -    Any stairs in or around the home? Yes  If so, are there any without handrails? No  Home free of loose throw rugs in walkways, pet beds, electrical cords, etc? Yes  Adequate lighting in your home to reduce risk of falls? Yes   ASSISTIVE DEVICES UTILIZED TO PREVENT FALLS:  Life alert? No  Use of a cane, walker or w/c? Yes  Grab bars in the bathroom? Yes  Shower chair or bench in shower? Yes  Elevated toilet seat or a handicapped toilet? Yes   TIMED UP AND GO:  Was the test performed? No .     Cognitive Function: MMSE - Mini Mental State Exam 05/11/2017    Orientation to time 5  Orientation to Place 5  Registration 3  Attention/ Calculation 4  Recall 1  Language- name 2 objects 2  Language- repeat 1  Language- follow 3 step command 3  Language- read & follow direction 1     6CIT Screen 08/03/2020 07/03/2019 05/11/2017  What Year? 0 points 0 points 0 points  What month? 0 points 0 points 0 points  What time? - 0 points 0 points  Count back from 20 0 points 0 points 0 points  Months in reverse 4 points 0 points 2 points  Repeat phrase 2 points 2 points 4 points  Total Score - 2 6    Immunizations Immunization History  Administered Date(s) Administered  . Influenza Split 09/07/2011, 07/30/2012  . Influenza Whole 10/10/2005, 08/31/2009, 07/10/2010  . Influenza, High Dose Seasonal PF 06/22/2016, 06/29/2017, 09/20/2018, 07/11/2019  . Influenza,inj,Quad PF,6+ Mos 07/01/2013, 06/20/2014  . Influenza-Unspecified 10/26/2015, 09/12/2018  . Meningococcal Conjugate 07/24/2014  . Moderna SARS-COVID-2 Vaccination 10/22/2019, 12/02/2019  . Pneumococcal Conjugate-13 07/24/2014  . Pneumococcal Polysaccharide-23 08/31/2009  . Td 06/10/2006  . Zoster Recombinat (Shingrix) 12/11/2018, 05/03/2019    TDAP status: Due, Education has been provided regarding the importance of this vaccine. Advised may receive this vaccine at local pharmacy or Health Dept. Aware to provide a copy of  the vaccination record if obtained from local pharmacy or Health Dept. Verbalized acceptance and understanding.   Flu Vaccine status: Declined, Education has been provided regarding the importance of this vaccine but patient still declined. Advised may receive this vaccine at local pharmacy or Health Dept. Aware to provide a copy of the vaccination record if obtained from local pharmacy or Health Dept. Verbalized acceptance and understanding.Pt stated he will get this week  Pneumococcal vaccine status: Up to date Covid-19 vaccine status: Completed vaccines  Qualifies for  Shingles Vaccine? Yes   Zostavax completed Yes   Shingrix Completed?: Yes  Screening Tests Health Maintenance  Topic Date Due  . TETANUS/TDAP  06/10/2016  . OPHTHALMOLOGY EXAM  01/25/2019  . URINE MICROALBUMIN  01/30/2019  . INFLUENZA VACCINE  05/10/2020  . COLONOSCOPY  10/10/2020 (Originally 02/27/2020)  . HEMOGLOBIN A1C  08/27/2020  . FOOT EXAM  03/27/2021  . COVID-19 Vaccine  Completed  . Hepatitis C Screening  Completed  . PNA vac Low Risk Adult  Completed    Health Maintenance  Health Maintenance Due  Topic Date Due  . TETANUS/TDAP  06/10/2016  . OPHTHALMOLOGY EXAM  01/25/2019  . URINE MICROALBUMIN  01/30/2019  . INFLUENZA VACCINE  05/10/2020    Colorectal cancer screening: pt wants to postpone until after treatments    Additional Screening:  Hepatitis C Screening: Completed 04/08/20  Vision Screening: Recommended annual ophthalmology exams for early detection of glaucoma and other disorders of the eye. Is the patient up to date with their annual eye exam?  Yes  Who is the provider or what is the name of the office in which the patient attends annual eye exams? Dr Darnell Level for eye exams   Dental Screening: Recommended annual dental exams for proper oral hygiene  Community Resource Referral / Chronic Care Management: CRR required this visit?  No   CCM required this visit?  No      Plan:     I have personally reviewed and noted the following in the patient's chart:   . Medical and social history . Use of alcohol, tobacco or illicit drugs  . Current medications and supplements . Functional ability and status . Nutritional status . Physical activity . Advanced directives . List of other physicians . Hospitalizations, surgeries, and ER visits in previous 12 months . Vitals . Screenings to include cognitive, depression, and falls . Referrals and appointments  In addition, I have reviewed and discussed with patient certain preventive protocols, quality  metrics, and best practice recommendations. A written personalized care plan for preventive services as well as general preventive health recommendations were provided to patient.     Joshua Brace, LPN   [id number]   Nurse Notes: Pt states will get Flu Vaccine this week, However all the other health maintenance he wants to put on hold until after Treatments are complete

## 2020-08-03 NOTE — Patient Instructions (Addendum)
[name] , Thank you for taking time to come for your Medicare Wellness Visit. I appreciate your ongoing commitment to your health goals. Please review the following plan we discussed and let me know if I can assist you in the future.   Screening recommendations/referrals: Colonoscopy: Postponed until 10/10/20 Recommended yearly ophthalmology/optometry visit for glaucoma screening and checkup Recommended yearly dental visit for hygiene and checkup  Vaccinations: Influenza vaccine: Pt stated he will receive this week  Pneumococcal vaccine: Up to date Tdap vaccine: Due and Discussed Shingles vaccine: Completed 3/3 & 05/03/19   Covid-19: Completed 1/12 & 12/02/19  Advanced directives: Advance directive discussed with you today. I have provided a copy for you to complete at home and have notarized. Once this is complete please bring a copy in to our office so we can scan it into your chart.  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   Preventive Care 29 Years and Older, Male Preventive care refers to lifestyle choices and visits with your health care provider that can promote health and wellness. What does preventive care include?  A yearly physical exam. This is also called an annual well check.  Dental exams once or twice a year.  Routine eye exams. Ask your health care provider how often you should have your eyes checked.  Personal lifestyle choices, including:  Daily care of your teeth and gums.  Regular physical activity.  Eating a healthy diet.  Avoiding tobacco and drug use.  Limiting alcohol use.  Practicing safe sex.  Taking low doses of aspirin every day.  Taking vitamin and mineral supplements as recommended by your health care provider. What happens during an annual well check? The services and screenings done by your health care provider during your annual well check will  depend on your age, overall health, lifestyle risk factors, and family history of disease. Counseling  Your health care provider may ask you questions about your:  Alcohol use.  Tobacco use.  Drug use.  Emotional well-being.  Home and relationship well-being.  Sexual activity.  Eating habits.  History of falls.  Memory and ability to understand (cognition).  Work and work Statistician. Screening  You may have the following tests or measurements:  Height, weight, and BMI.  Blood pressure.  Lipid and cholesterol levels. These may be checked every 5 years, or more frequently if you are over 30 years old.  Skin check.  Lung cancer screening. You may have this screening every year starting at age 79 if you have a 30-pack-year history of smoking and currently smoke or have quit within the past 15 years.  Fecal occult blood test (FOBT) of the stool. You may have this test every year starting at age 79.  Flexible sigmoidoscopy or colonoscopy. You may have a sigmoidoscopy every 5 years or a colonoscopy every 10 years starting at age 79.  Prostate cancer screening. Recommendations will vary depending on your family history and other risks.  Hepatitis C blood test.  Hepatitis B blood test.  Sexually transmitted disease (STD) testing.  Diabetes screening. This is done by checking your blood sugar (glucose) after you have not eaten for a while (fasting). You may have this done every 1-3 years.  Abdominal aortic aneurysm (AAA) screening. You may need this if you are a current or former smoker.  Osteoporosis. You may be screened starting at age 79 if you are at high risk. Talk  with your health care provider about your test results, treatment options, and if necessary, the need for more tests. Vaccines  Your health care provider may recommend certain vaccines, such as:  Influenza vaccine. This is recommended every year.  Tetanus, diphtheria, and acellular pertussis (Tdap,  Td) vaccine. You may need a Td booster every 10 years.  Zoster vaccine. You may need this after age 79.  Pneumococcal 13-valent conjugate (PCV13) vaccine. One dose is recommended after age 79.  Pneumococcal polysaccharide (PPSV23) vaccine. One dose is recommended after age 79. Talk to your health care provider about which screenings and vaccines you need and how often you need them. This information is not intended to replace advice given to you by your health care provider. Make sure you discuss any questions you have with your health care provider. Document Released: 10/23/2015 Document Revised: 06/15/2016 Document Reviewed: 07/28/2015 Elsevier Interactive Patient Education  2017 [employer] Prevention in the Home Falls can cause injuries. They can happen to people of all ages. There are many things you can do to make your home safe and to help prevent falls. What can I do on the outside of my home?  Regularly fix the edges of walkways and driveways and fix any cracks.  Remove anything that might make you trip as you walk through a door, such as a raised step or threshold.  Trim any bushes or trees on the path to your home.  Use bright outdoor lighting.  Clear any walking paths of anything that might make someone trip, such as rocks or tools.  Regularly check to see if handrails are loose or broken. Make sure that both sides of any steps have handrails.  Any raised decks and porches should have guardrails on the edges.  Have any leaves, snow, or ice cleared regularly.  Use sand or salt on walking paths during winter.  Clean up any spills in your garage right away. This includes oil or grease spills. What can I do in the bathroom?  Use night lights.  Install grab bars by the toilet and in the tub and shower. Do not use towel bars as grab bars.  Use non-skid mats or decals in the tub or shower.  If you need to sit down in the shower, use a plastic, non-slip  stool.  Keep the floor dry. Clean up any water that spills on the floor as soon as it happens.  Remove soap buildup in the tub or shower regularly.  Attach bath mats securely with double-sided non-slip rug tape.  Do not have throw rugs and other things on the floor that can make you trip. What can I do in the bedroom?  Use night lights.  Make sure that you have a light by your bed that is easy to reach.  Do not use any sheets or blankets that are too big for your bed. They should not hang down onto the floor.  Have a firm chair that has side arms. You can use this for support while you get dressed.  Do not have throw rugs and other things on the floor that can make you trip. What can I do in the kitchen?  Clean up any spills right away.  Avoid walking on wet floors.  Keep items that you use a lot in easy-to-reach places.  If you need to reach something above you, use a strong step stool that has a grab bar.  Keep electrical cords out of the way.  Do  not use floor polish or wax that makes floors slippery. If you must use wax, use non-skid floor wax.  Do not have throw rugs and other things on the floor that can make you trip. What can I do with my stairs?  Do not leave any items on the stairs.  Make sure that there are handrails on both sides of the stairs and use them. Fix handrails that are broken or loose. Make sure that handrails are as long as the stairways.  Check any carpeting to make sure that it is firmly attached to the stairs. Fix any carpet that is loose or worn.  Avoid having throw rugs at the top or bottom of the stairs. If you do have throw rugs, attach them to the floor with carpet tape.  Make sure that you have a light switch at the top of the stairs and the bottom of the stairs. If you do not have them, ask someone to add them for you. What else can I do to help prevent falls?  Wear shoes that:  Do not have high heels.  Have rubber bottoms.  Are  comfortable and fit you well.  Are closed at the toe. Do not wear sandals.  If you use a stepladder:  Make sure that it is fully opened. Do not climb a closed stepladder.  Make sure that both sides of the stepladder are locked into place.  Ask someone to hold it for you, if possible.  Clearly mark and make sure that you can see:  Any grab bars or handrails.  First and last steps.  Where the edge of each step is.  Use tools that help you move around (mobility aids) if they are needed. These include:  Canes.  Walkers.  Scooters.  Crutches.  Turn on the lights when you go into a dark area. Replace any light bulbs as soon as they burn out.  Set up your furniture so you have a clear path. Avoid moving your furniture around.  If any of your floors are uneven, fix them.  If there are any pets around you, be aware of where they are.  Review your medicines with your doctor. Some medicines can make you feel dizzy. This can increase your chance of falling. Ask your doctor what other things that you can do to help prevent falls. This information is not intended to replace advice given to you by your health care provider. Make sure you discuss any questions you have with your health care provider. Document Released: 07/23/2009 Document Revised: 03/03/2016 Document Reviewed: 10/31/2014 Elsevier Interactive Patient Education  2017 [employer].

## 2020-08-05 ENCOUNTER — Other Ambulatory Visit: Payer: Self-pay | Admitting: Student

## 2020-08-05 NOTE — Telephone Encounter (Signed)
Charge correction has reversed $50.00.  I have informed patient.    Credit is showing on patients account.

## 2020-08-06 ENCOUNTER — Other Ambulatory Visit: Payer: Self-pay

## 2020-08-06 ENCOUNTER — Ambulatory Visit (HOSPITAL_COMMUNITY)
Admission: RE | Admit: 2020-08-06 | Discharge: 2020-08-06 | Disposition: A | Discharge status: Home / Self Care | Payer: Medicare HMO | Source: Ambulatory Visit | Attending: Radiation Oncology | Admitting: Radiation Oncology

## 2020-08-06 ENCOUNTER — Encounter (HOSPITAL_COMMUNITY): Payer: Self-pay

## 2020-08-06 ENCOUNTER — Ambulatory Visit (HOSPITAL_COMMUNITY)
Admission: RE | Admit: 2020-08-06 | Discharge: 2020-08-06 | Disposition: A | Discharge status: Home / Self Care | Payer: Medicare HMO | Source: Ambulatory Visit | Attending: Family Medicine | Admitting: Family Medicine

## 2020-08-06 DIAGNOSIS — C220 Liver cell carcinoma: Secondary | ICD-10-CM

## 2020-08-06 DIAGNOSIS — E119 Type 2 diabetes mellitus without complications: Secondary | ICD-10-CM | POA: Insufficient documentation

## 2020-08-06 LAB — CBC
HCT: 34.9 % — ABNORMAL LOW (ref 39.0–52.0)
Hemoglobin: 11.8 g/dL — ABNORMAL LOW (ref 13.0–17.0)
MCH: 35.1 pg — ABNORMAL HIGH (ref 26.0–34.0)
MCHC: 33.8 g/dL (ref 30.0–36.0)
MCV: 103.9 fL — ABNORMAL HIGH (ref 80.0–100.0)
Platelets: 98 10*3/uL — ABNORMAL LOW (ref 150–400)
RBC: 3.36 MIL/uL — ABNORMAL LOW (ref 4.22–5.81)
RDW: 15.4 % (ref 11.5–15.5)
WBC: 6.1 10*3/uL (ref 4.0–10.5)
nRBC: 0 % (ref 0.0–0.2)

## 2020-08-06 LAB — PROTIME-INR
INR: 1 (ref 0.8–1.2)
Prothrombin Time: 13 seconds (ref 11.4–15.2)

## 2020-08-06 LAB — GLUCOSE, CAPILLARY: Glucose-Capillary: 162 mg/dL — ABNORMAL HIGH (ref 70–99)

## 2020-08-06 MED ORDER — FENTANYL CITRATE (PF) 100 MCG/2ML IJ SOLN
INTRAMUSCULAR | Status: AC
  Filled 2020-08-06: qty 2

## 2020-08-06 MED ORDER — LIDOCAINE HCL (PF) 1 % IJ SOLN
INTRAMUSCULAR | Status: AC | PRN
  Administered 2020-08-06: 10 mL via INTRADERMAL

## 2020-08-06 MED ORDER — SODIUM CHLORIDE 0.9 % IV SOLN: INTRAVENOUS | Status: DC

## 2020-08-06 MED ORDER — FENTANYL CITRATE (PF) 100 MCG/2ML IJ SOLN
INTRAMUSCULAR | Status: AC | PRN
  Administered 2020-08-06: 50 ug via INTRAVENOUS

## 2020-08-06 MED ORDER — MIDAZOLAM HCL 2 MG/2ML IJ SOLN
INTRAMUSCULAR | Status: AC
  Filled 2020-08-06: qty 2

## 2020-08-06 MED ORDER — MIDAZOLAM HCL 2 MG/2ML IJ SOLN
INTRAMUSCULAR | Status: AC | PRN
  Administered 2020-08-06 (×2): 1 mg via INTRAVENOUS

## 2020-08-06 MED ORDER — LIDOCAINE HCL 1 % IJ SOLN
INTRAMUSCULAR | Status: AC
  Filled 2020-08-06: qty 20

## 2020-08-06 NOTE — Discharge Instructions (Signed)
Please call Interventional Radiology clinic [phone] with any questions or concerns.  You may remove your dressing and shower tomorrow.    Fiducial Marker Placement, Care After These instructions give you information on caring for yourself after your procedure. Your doctor may also give you more specific instructions. Call your doctor if you have any problems or questions after your procedure. What can I expect after the procedure? After the procedure, it is common to have:  Pain and soreness where the biopsy was done.  Bruising around the area where the biopsy was done.  Sleepiness and be tired for a few days. Follow these instructions at home: Medicines  Take over-the-counter and prescription medicines only as told by your doctor.  If you were prescribed an antibiotic medicine, take it as told by your doctor. Do not stop taking the antibiotic even if you start to feel better.  Do not take medicines such as aspirin and ibuprofen. These medicines can thin your blood. Do not take these medicines unless your doctor tells you to take them.  If you are taking prescription pain medicine, take actions to prevent or treat constipation. Your doctor may recommend that you: ? Drink enough fluid to keep your pee (urine) clear or pale yellow. ? Take over-the-counter or prescription medicines. ? Eat foods that are high in fiber, such as fresh fruits and vegetables, whole grains, and beans. ? Limit foods that are high in fat and processed sugars, such as fried and sweet foods. Caring for your cut  Follow instructions from your doctor about how to take care of your cuts from surgery (incisions). Make sure you: ? Wash your hands with soap and water before you change your bandage (dressing). If you cannot use soap and water, use hand sanitizer. ? Change your bandage as told by your doctor.  Check your cuts every day for signs of infection. Check for: ? Redness, swelling, or more  pain. ? Fluid or blood. ? Pus or a bad smell. ? Warmth.  Do not take baths, swim, or use a hot tub until your doctor says it is okay to do so. Activity   Rest at home for 1-2 days or as told by your doctor. ? Avoid sitting for a long time without moving. Get up to take short walks every 1-2 hours.  Return to your normal activities as told by your doctor. Ask what activities are safe for you.  Do not do these things in the first 24 hours: ? Drive. ? Use machinery. ? Take a bath or shower.  Do not lift more than 10 pounds (4.5 kg) or play contact sports for the first 2 weeks. General instructions   Do not drink alcohol in the first week after the procedure.  Have someone stay with you for at least 24 hours after the procedure.  Keep all follow-up visits as told by your doctor. This is important. Contact a doctor if:  A cut bleeds and leaves more than just a small spot of blood.  A cut is red, puffs up (swells), or hurts more than before.  Fluid or something else comes from a cut.  A cut smells bad.  You have a fever or chills. Get help right away if:  You have swelling, bloating, or pain in your belly (abdomen).  You get dizzy or faint.  You have a rash.  You feel sick to your stomach (nauseous) or throw up (vomit).  You have trouble breathing, feel short of breath, or feel  faint.  Your chest hurts.  You have problems talking or seeing.  You have trouble with your balance or moving your arms or legs. Summary  After the procedure, it is common to have pain, soreness, bruising, and tiredness.  Your doctor will tell you how to take care of yourself at home. Change your bandage, take your medicines, and limit your activities as told by your doctor.  Call your doctor if you have symptoms of infection. Get help right away if your belly swells, your cut bleeds a lot, or you have trouble talking or breathing. This information is not intended to replace advice  given to you by your health care provider. Make sure you discuss any questions you have with your health care provider. Document Revised: 10/06/2017 Document Reviewed: 10/06/2017 Elsevier Patient Education  [place].   Moderate Conscious Sedation, Adult, Care After These instructions provide you with information about caring for yourself after your procedure. Your health care provider may also give you more specific instructions. Your treatment has been planned according to current medical practices, but problems sometimes occur. Call your health care provider if you have any problems or questions after your procedure. What can I expect after the procedure? After your procedure, it is common:  To feel sleepy for several hours.  To feel clumsy and have poor balance for several hours.  To have poor judgment for several hours.  To vomit if you eat too soon. Follow these instructions at home: For at least 24 hours after the procedure:   Do not: ? Participate in activities where you could fall or become injured. ? Drive. ? Use heavy machinery. ? Drink alcohol. ? Take sleeping pills or medicines that cause drowsiness. ? Make important decisions or sign legal documents. ? Take care of children on your own.  Rest. Eating and drinking  Follow the diet recommended by your health care provider.  If you vomit: ? Drink water, juice, or soup when you can drink without vomiting. ? Make sure you have little or no nausea before eating solid foods. General instructions  Have a responsible adult stay with you until you are awake and alert.  Take over-the-counter and prescription medicines only as told by your health care provider.  If you smoke, do not smoke without supervision.  Keep all follow-up visits as told by your health care provider. This is important. Contact a health care provider if:  You keep feeling nauseous or you keep vomiting.  You feel light-headed.  You  develop a rash.  You have a fever. Get help right away if:  You have trouble breathing. This information is not intended to replace advice given to you by your health care provider. Make sure you discuss any questions you have with your health care provider. Document Revised: 09/08/2017 Document Reviewed: 01/16/2016 Elsevier Patient Education  2020 [employer].

## 2020-08-06 NOTE — Procedures (Signed)
Interventional Radiology Procedure Note  Procedure: Successful fiducial placement x3  Complications: None  Estimated Blood Loss: None  Recommendations: - bedrest x 1 hr - DC home   Signed,  Criselda Peaches, MD

## 2020-08-06 NOTE — H&P (Signed)
Chief Complaint: Patient was seen in consultation today for HCC/fiducial marker placement.  Referring Physician(s): Hayden Pedro  Supervising Physician: Jacqulynn Cadet  Patient Status: [name] - Out-pt  History of Present Illness: [name] is a 79 y.o. male with a past medical history of hypertension, hyperlipidemia, HF, COPD, diverticulosis, diabetes mellitus type II, BPH, and HCC. He was unfortunately diagnosed with [place] in 04/2020. He was referred to IR for further management, and had tentative plans for bland embolization of liver mass. He presented for this procedure in IR 06/01/2020, however procedure was aborted secondary to possibility of total occlusion of vascular sheath if embolization occurred. He was then referred to radiation oncology for further management, who has tentative plans for radiation of liver mass at this time.  IR consulted by Shona Simpson, PA-C for possible image-guided liver mass fiducial marker placement. Patient awake and alert sitting in bed watching TV with no complaints at this time. Denies fever, chills, chest pain, dyspnea, abdominal pain, or headache.   Past Medical History:  Diagnosis Date   Benign prostatic hypertrophy    CHF (congestive heart failure) ([place])    a. EF 20-25% by echo in 11/2019   COPD (chronic obstructive pulmonary disease) ([place]) December 15, 2009   FEV1 2.30 (70%) ratio 63 no better with B2 and DLCO 18/6 (73%) corrects to 106%   Degenerative joint disease    Depression    Diverticulosis    Essential hypertension    ACE inhibitor cough   Fatty liver    Hyperlipidemia    Internal hemorrhoids    Low back pain    Otitis externa 07/30/2012   Splenomegaly    Type 2 diabetes mellitus ([place])    Ulcer of foot ([place])    Right foot    Past Surgical History:  Procedure Laterality Date   IR EMBO TUMOR ORGAN ISCHEMIA INFARCT INC GUIDE ROADMAPPING  06/01/2020   IR RADIOLOGIST EVAL & MGMT  04/28/2020   IR  RADIOLOGIST EVAL & MGMT  07/15/2020   NO PAST SURGERIES  1980    Allergies: Patient has no known allergies.  Medications: Prior to Admission medications   Medication Sig Start Date End Date Taking? Authorizing Provider  acyclovir (ZOVIRAX) 400 MG tablet TAKE 1 TABLET TWICE DAILY 01/13/20   [name], MD  albuterol (VENTOLIN HFA) 108 (90 Base) MCG/ACT inhaler Inhale 2 puffs into the lungs every 6 (six) hours as needed for wheezing or shortness of breath. 11/29/19   [name], MD  amitriptyline (ELAVIL) 50 MG tablet TAKE 1 TABLET AT BEDTIME 07/23/20   [name], MD  ASCORBIC ACID PO Take 1 tablet by mouth daily.     [provider]  aspirin EC 81 MG tablet Take 81 mg by mouth daily.    [provider]  Blood Glucose Monitoring Suppl (TRUE METRIX AIR GLUCOSE METER) w/Device KIT  08/01/18   [provider]  busPIRone (BUSPAR) 5 MG tablet Take 1 tablet (5 mg total) by mouth 2 (two) times daily as needed (anxiety). 02/25/20   [name], MD  carvedilol (COREG) 6.25 MG tablet Take 1 tablet (6.25 mg total) by mouth 2 (two) times daily. 01/28/20   Strader, Fransisco Hertz, PA-C  Cholecalciferol 1000 UNITS capsule Take 1 capsule (1,000 Units total) by mouth 2 (two) times daily. 05/19/11   Parrett, Fonnie Mu, NP  clotrimazole (LOTRIMIN) 1 % cream For athlete's feet, apply to both feet and between toes twice daily for 4  weeks. Patient not taking: Reported on 08/03/2020 05/15/20   [place] Board, DPM  clotrimazole-betamethasone (LOTRISONE) cream Apply 1 application topically 2 (two) times daily. For 7 days maximum for severe jock itch . Stop if worsening symptoms. Patient not taking: Reported on 08/03/2020 11/07/19   [name], MD  escitalopram (LEXAPRO) 5 MG tablet Take 1 tablet (5 mg total) by mouth daily. 01/07/20   [name], MD  famotidine (PEPCID) 20 MG tablet TAKE 1 TABLET TWICE DAILY 07/23/20   [name], MD  finasteride  (PROSCAR) 5 MG tablet TAKE 1 TABLET EVERY DAY 07/23/20   [name], MD  furosemide (LASIX) 20 MG tablet TAKE 1 TABLET (20 MG TOTAL) BY MOUTH DAILY. 07/29/20 08/28/20  [name], MD  gabapentin (NEURONTIN) 300 MG capsule Take 1 capsule (300 mg total) by mouth 2 (two) times daily. 12/12/19   [name], MD  glucose blood (TRUE METRIX BLOOD GLUCOSE TEST) test strip TEST BLOOD SUGAR EVERY DAY 11/05/19   [name], MD  Multiple Vitamin (MULTIVITAMIN) capsule Take 1 capsule by mouth daily.      [provider]  mupirocin ointment (BACTROBAN) 2 % Apply to right foot ulcer once daily and cover with dressing. Patient not taking: Reported on 08/03/2020 02/18/20   [place] Board, DPM  NONFORMULARY OR COMPOUNDED ITEM [facility]  Peripheral Neuropathy Cream- Bupivacaine 1%, Doxepin 3%, Gabapentin 6%, Pentoxifylline 3%, Topiramate 1% Apply 1-2 grams to affected area 3-4 times daily Qty. 120 gm 3 refills 02/20/17   [provider]  omeprazole (PRILOSEC) 20 MG capsule TAKE 1 CAPSULE DAILY 30 TO 60 MINUTES BEFORE FIRST MEAL OF THE DAY 07/23/20   [name], MD  rosuvastatin (CRESTOR) 10 MG tablet Take 1 tablet (10 mg total) by mouth daily. 01/07/20   [name], MD  sacubitril-valsartan (ENTRESTO) 49-51 MG Take 1 tablet by mouth 2 (two) times daily. 04/03/20   Strader, Fransisco Hertz, PA-C  sodium chloride (MURO 128) 5 % ophthalmic solution Place 1 drop into the left eye at bedtime. Patient not taking: Reported on 08/03/2020    [provider]  tamsulosin (FLOMAX) 0.4 MG CAPS capsule TAKE 1 CAPSULE EVERY DAY 07/23/20   [name], MD  TRUEPLUS LANCETS 28G MISC Use to test blood sugars two times daily. Dx: E11.9 07/30/18   [name], MD     Family History  Problem Relation Age of Onset   Melanoma Mother    Stroke Father    CAD Brother        Premature disease    Social History   Socioeconomic History   Marital  status: Married    Spouse name: Not on file   Number of children: Not on file   Years of education: Not on file   Highest education level: Not on file  Occupational History   Occupation: retired    Comment: maintenence work  Tobacco Use   Smoking status: Former Smoker    Packs/day: 4.00    Years: 20.00    Pack years: 80.00    Types: Cigarettes    Quit date: 09/18/1984    Years since quitting: 35.9   Smokeless tobacco: Never Used   Tobacco comment: quit in 1980  Vaping Use   Vaping Use: Never used  Substance and Sexual Activity   Alcohol use: No    Comment: no drinking since 30 years plus   Drug use: No   Sexual activity:  Yes  Other Topics Concern   Not on file  Social History Narrative   Married 53 years in 2015. 4 kids (2 sons) and 3 grandkids.    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            Social Determinants of Radio broadcast assistant Strain: Low Risk    Difficulty of Paying Living Expenses: Not hard at all  Food Insecurity: No Food Insecurity   Worried About Charity fundraiser in the Last Year: Never true   Arboriculturist in the Last Year: Never true  Transportation Needs: No Transportation Needs   Lack of Transportation (Medical): No   Lack of Transportation (Non-Medical): No  Physical Activity: Inactive   Days of Exercise per Week: 0 days   Minutes of Exercise per Session: 0 min  Stress: No Stress Concern Present   Feeling of Stress : Not at all  Social Connections: Moderately Integrated   Frequency of Communication with Friends and Family: More than three times a week   Frequency of Social Gatherings with Friends and Family: Twice a week   Attends Religious Services: More than 4 times per year   Active Member of [employer] or Organizations: No   Attends Archivist Meetings: Never   Marital Status: Married     Review of  Systems: A 12 point ROS discussed and pertinent positives are indicated in the HPI above.  All other systems are negative.  Review of Systems  Constitutional: Negative for chills and fever.  Respiratory: Negative for shortness of breath and wheezing.   Cardiovascular: Negative for chest pain and palpitations.  Gastrointestinal: Negative for abdominal pain.  Neurological: Negative for headaches.  Psychiatric/Behavioral: Negative for behavioral problems and confusion.    Vital Signs: BP 137/69    Pulse 78    Temp 97.9 F (36.6 C) (Oral)    Resp 17    Ht 6' (1.829 m)    Wt 195 lb (88.5 kg)    SpO2 97%    BMI 26.45 kg/m   Physical Exam Vitals and nursing note reviewed.  Constitutional:      General: He is not in acute distress.    Appearance: Normal appearance.  Cardiovascular:     Rate and Rhythm: Normal rate and regular rhythm.     Heart sounds: Normal heart sounds. No murmur heard.   Pulmonary:     Effort: Pulmonary effort is normal. No respiratory distress.     Breath sounds: Normal breath sounds. No wheezing.  Skin:    General: Skin is warm and dry.  Neurological:     Mental Status: He is alert and oriented to person, place, and time.      MD Evaluation Airway: WNL Heart: WNL Abdomen: WNL Chest/ Lungs: WNL ASA  Classification: 3 Mallampati/Airway Score: Two   Imaging: MR ABDOMEN WWO CONTRAST  Result Date: 07/08/2020 CLINICAL DATA:  Cirrhosis, follow-up LI-RADS category 5 liver lesion EXAM: MRI ABDOMEN WITHOUT AND WITH CONTRAST TECHNIQUE: Multiplanar multisequence MR imaging of the abdomen was performed both before and after the administration of intravenous contrast. CONTRAST:  15m GADAVIST GADOBUTROL 1 MMOL/ML IV SOLN COMPARISON:  MR abdomen, 03/27/2020 FINDINGS: Lower chest: No acute findings. Hepatobiliary: Significant interval increase in size of a subcapsular mass of the lateral anterior left lobe of the liver, hepatic segment IVA, now  measuring approximately 4.6  x 3.2 cm, previously 3.5 x 2.8 cm (series 31, image 29). This lesion features a region of vivid early arterial hyperenhancement inferiorly, but is predominantly characterized by hypoenhancement to liver parenchyma with capsular enhancement. No additional lesions are identified. There is mild hypertrophy of the left lobe and caudate without overt morphologic stigmata of cirrhosis. Pancreas: No mass, inflammatory changes, or other parenchymal abnormality identified. Spleen:  Splenomegaly, maximum coronal span 15.8 cm. Adrenals/Urinary Tract: No masses identified. No evidence of hydronephrosis. Stomach/Bowel: Visualized portions within the abdomen are unremarkable. Vascular/Lymphatic: No pathologically enlarged lymph nodes identified. Severe, irregular aortic atherosclerosis. No abdominal aortic aneurysm demonstrated. Other:  None. Musculoskeletal: No suspicious bone lesions identified. IMPRESSION: 1. Significant interval increase in size of a subcapsular mass of the lateral anterior left lobe of the liver, hepatic segment IVA, now measuring approximately 4.6 x 3.2 cm, previously 3.5 x 2.8 cm. 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 2. No additional suspicious lesions or contrast enhancement are identified. 3. Mild hypertrophy of the left lobe of the liver and caudate without overt morphologic stigmata of cirrhosis. 4. Splenomegaly. Electronically Signed   By: Eddie Candle M.D.   On: 07/08/2020 12:20   IR Radiologist Eval & Mgmt  Result Date: 07/15/2020 Please refer to notes tab for details about interventional procedure. (Op Note)   Labs:  CBC: Recent Labs    04/08/20 1447 06/01/20 0815 06/09/20 0859 08/06/20 1124  WBC 4.7 4.5 4.1 6.1  HGB 10.8* 11.6* 10.9* 11.8*  HCT 33.9* 34.6* 33.5* 34.9*  PLT 120* 85* 73* 98*    COAGS: Recent Labs    12/04/19 0312 04/08/20 1446 06/01/20 0815 08/06/20 1124  INR 1.1 1.1 1.1 1.0    BMP: Recent Labs     03/17/20 1335 04/08/20 1447 06/01/20 0815 06/09/20 0859  NA 141 143 139 141  K 4.4 4.5 4.6 5.1  CL 105 108 105 107  CO2 '26 27 24 28  ' GLUCOSE 128* 159* 126* 175*  BUN 14 12 26* 22  CALCIUM 9.0 8.8* 9.0 10.0  CREATININE 0.93 1.14 1.37* 1.33*  GFRNONAA >60 >60 49* 50*  GFRAA >60 >60 56* 59*    LIVER FUNCTION TESTS: Recent Labs    01/22/20 1412 04/08/20 1447 06/01/20 0815 06/09/20 0859  BILITOT 0.7 0.3 0.4 0.4  AST 19 22 37 26  ALT '10 10 23 16  ' ALKPHOS 80 103 81 104  PROT 6.0 6.5 6.8 6.6  ALBUMIN 3.7 3.4* 3.9 3.7     Assessment and Plan:  [place] with tentative plans for targeted radiation therapy. Plan for image-guided liver mass fiducial marker placement today in IR. Patient is NPO. Afebrile and WBCs WNL. He does not take blood thinners. INR 1.0 today.  Risks and benefits discussed with the patient including, but not limited to bleeding, infection, or damage to adjacent structures. All of the patient's questions were answered, patient is agreeable to proceed. Consent signed and in chart.   Thank you for this interesting consult.  I greatly enjoyed meeting NIKOLAY DEMETRIOU and look forward to participating in their care.  A copy of this report was sent to the requesting provider on this date.  Electronically Signed: Earley Abide, PA-C 08/06/2020, 12:27 PM   I spent a total of 25 Minutes in face to face in clinical consultation, greater than 50% of which was counseling/coordinating care for HCC/fiducial marker placement.

## 2020-08-07 ENCOUNTER — Telehealth: Payer: Self-pay

## 2020-08-07 NOTE — Progress Notes (Addendum)
Chronic Care Management Pharmacy Assistant   Name: [name]  MRN: [id number] DOB: Feb 06, 1941  Reason for Encounter: Patient Assistance Application   PCP : Joshua Olp, MD  Allergies:  No Known Allergies  Medications: Outpatient Encounter Medications as of 08/07/2020  Medication Sig Note   albuterol (VENTOLIN HFA) 108 (90 Base) MCG/ACT inhaler Inhale 2 puffs into the lungs every 6 (six) hours as needed for wheezing or shortness of breath.    amitriptyline (ELAVIL) 50 MG tablet TAKE 1 TABLET AT BEDTIME    ASCORBIC ACID PO Take 1 tablet by mouth daily.     aspirin EC 81 MG tablet Take 81 mg by mouth daily.    Blood Glucose Monitoring Suppl (TRUE METRIX AIR GLUCOSE METER) w/Device KIT     busPIRone (BUSPAR) 5 MG tablet Take 1 tablet (5 mg total) by mouth 2 (two) times daily as needed (anxiety).    carvedilol (COREG) 6.25 MG tablet Take 1 tablet (6.25 mg total) by mouth 2 (two) times daily.    Cholecalciferol 1000 UNITS capsule Take 1 capsule (1,000 Units total) by mouth 2 (two) times daily.    clotrimazole (LOTRIMIN) 1 % cream For athlete's feet, apply to both feet and between toes twice daily for 4 weeks.    clotrimazole-betamethasone (LOTRISONE) cream Apply 1 application topically 2 (two) times daily. For 7 days maximum for severe jock itch . Stop if worsening symptoms. 12/03/2019: Unknown start date    escitalopram (LEXAPRO) 5 MG tablet Take 1 tablet (5 mg total) by mouth daily.    famotidine (PEPCID) 20 MG tablet TAKE 1 TABLET TWICE DAILY    finasteride (PROSCAR) 5 MG tablet TAKE 1 TABLET EVERY DAY    furosemide (LASIX) 20 MG tablet TAKE 1 TABLET (20 MG TOTAL) BY MOUTH DAILY.    gabapentin (NEURONTIN) 300 MG capsule Take 1 capsule (300 mg total) by mouth 2 (two) times daily.    glucose blood (TRUE METRIX BLOOD GLUCOSE TEST) test strip TEST BLOOD SUGAR EVERY DAY    Multiple Vitamin (MULTIVITAMIN) capsule Take 1 capsule by mouth daily.      mupirocin ointment (BACTROBAN)  2 % Apply to right foot ulcer once daily and cover with dressing.    NONFORMULARY OR COMPOUNDED ITEM [facility]  Peripheral Neuropathy Cream- Bupivacaine 1%, Doxepin 3%, Gabapentin 6%, Pentoxifylline 3%, Topiramate 1% Apply 1-2 grams to affected area 3-4 times daily Qty. 120 gm 3 refills 08/03/2020: As needed   omeprazole (PRILOSEC) 20 MG capsule TAKE 1 CAPSULE DAILY 30 TO 60 MINUTES BEFORE FIRST MEAL OF THE DAY    rosuvastatin (CRESTOR) 10 MG tablet Take 1 tablet (10 mg total) by mouth daily.    sacubitril-valsartan (ENTRESTO) 49-51 MG Take 1 tablet by mouth 2 (two) times daily.    sodium chloride (MURO 128) 5 % ophthalmic solution Place 1 drop into the left eye at bedtime.     tamsulosin (FLOMAX) 0.4 MG CAPS capsule TAKE 1 CAPSULE EVERY DAY    TRUEPLUS LANCETS 28G MISC Use to test blood sugars two times daily. Dx: E11.9    [DISCONTINUED] acyclovir (ZOVIRAX) 400 MG tablet TAKE 1 TABLET TWICE DAILY    No facility-administered encounter medications on file as of 08/07/2020.    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[place]) 06/01/2020   Anxiety 61/95/0932   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[place]) 12/14/2019   Aortic atherosclerosis ([place]) 67/09/4579   Chronic systolic heart failure (  [place]) 12/05/2019   Diabetic ulcer of right midfoot associated with diabetes mellitus due to underlying condition, with fat layer exposed ([place]) 11/07/2019   Anemia 03/05/2018   GERD (gastroesophageal reflux disease) 09/30/2017   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 positive 03/29/2016   Thrombocytopenia ([place]) 12/21/2015   B12 deficiency 08/20/2015   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   Diabetic polyneuropathy associated with type 2 diabetes mellitus ([place]) 08/07/2015   Diplopia 06/05/2015   CKD (chronic kidney disease), stage III ([place]) 05/06/2015   Fatty liver 11/04/2014   [place]idal balanoposthitis 06/20/2014   PCO (posterior capsular opacification)  06/19/2013   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   Pseudophakia of left eye 04/10/2013   ILD (interstitial lung disease) ([place]) 07/05/2012   Nuclear cataract 01/20/2012   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   COPD (chronic obstructive pulmonary disease) ([place]) 12/15/2009   BPH (benign prostatic hyperplasia) 06/20/2007   Depression 05/02/2007   Chronic back pain. Off narcotics 06/05/17 due to negative UDS for opiates x2. Full history 06/12/14. Pain contract signed.  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[place]) 04/06/2007   Hyperlipidemia associated with type 2 diabetes mellitus ([place]) 04/06/2007   Essential hypertension 04/06/2007   Osteoarthritis 04/06/2007     Spoke with patients wife . Informed wife that we will be mailing [place] Patient assistance application . Informed patients wife to have patient complete and return to Dr. Ronney Frazier Office.  [name] ,[place] Pharmacist Assistant [phone]  Follow-Up:  Pharmacist Review  Reviewed and sent to pt with following instructions:  [name]  Please complete the patient section of each application and provide any required financial information requested. You can drop these off with your cardiologist or can bring to [place] for me to coordinate final steps - just let the front desk know these are for [name] if dropping off at [employer].   You can reach my team at [phone]. If we are not able to answer please leave a message with any questions and we will return your call.  Thank you! [name], Pharm.D., BCGP Clinical Pharmacist [facility] [phone]

## 2020-08-10 ENCOUNTER — Telehealth: Payer: Self-pay | Admitting: *Deleted

## 2020-08-10 ENCOUNTER — Other Ambulatory Visit: Payer: Self-pay

## 2020-08-10 ENCOUNTER — Inpatient Hospital Stay: Payer: Medicare HMO | Attending: Hematology

## 2020-08-10 ENCOUNTER — Inpatient Hospital Stay: Payer: Medicare HMO | Admitting: Hematology

## 2020-08-10 VITALS — BP 152/75 | HR 82 | Temp 98.0°F | Resp 18 | Ht 72.0 in | Wt 207.8 lb

## 2020-08-10 DIAGNOSIS — D696 Thrombocytopenia, unspecified: Secondary | ICD-10-CM | POA: Insufficient documentation

## 2020-08-10 DIAGNOSIS — Z87891 Personal history of nicotine dependence: Secondary | ICD-10-CM | POA: Diagnosis not present

## 2020-08-10 DIAGNOSIS — E538 Deficiency of other specified B group vitamins: Secondary | ICD-10-CM | POA: Insufficient documentation

## 2020-08-10 DIAGNOSIS — C220 Liver cell carcinoma: Secondary | ICD-10-CM | POA: Insufficient documentation

## 2020-08-10 LAB — CBC WITH DIFFERENTIAL/PLATELET
Abs Immature Granulocytes: 0.02 10*3/uL (ref 0.00–0.07)
Basophils Absolute: 0 10*3/uL (ref 0.0–0.1)
Basophils Relative: 0 %
Eosinophils Absolute: 0.1 10*3/uL (ref 0.0–0.5)
Eosinophils Relative: 1 %
HCT: 33.7 % — ABNORMAL LOW (ref 39.0–52.0)
Hemoglobin: 11.4 g/dL — ABNORMAL LOW (ref 13.0–17.0)
Immature Granulocytes: 0 %
Lymphocytes Relative: 28 %
Lymphs Abs: 1.7 10*3/uL (ref 0.7–4.0)
MCH: 35.2 pg — ABNORMAL HIGH (ref 26.0–34.0)
MCHC: 33.8 g/dL (ref 30.0–36.0)
MCV: 104 fL — ABNORMAL HIGH (ref 80.0–100.0)
Monocytes Absolute: 0.4 10*3/uL (ref 0.1–1.0)
Monocytes Relative: 6 %
Neutro Abs: 4 10*3/uL (ref 1.7–7.7)
Neutrophils Relative %: 65 %
Platelets: 88 10*3/uL — ABNORMAL LOW (ref 150–400)
RBC: 3.24 MIL/uL — ABNORMAL LOW (ref 4.22–5.81)
RDW: 15 % (ref 11.5–15.5)
WBC: 6.3 10*3/uL (ref 4.0–10.5)
nRBC: 0 % (ref 0.0–0.2)

## 2020-08-10 LAB — CMP (CANCER CENTER ONLY)
ALT: 17 U/L (ref 0–44)
AST: 23 U/L (ref 15–41)
Albumin: 3.8 g/dL (ref 3.5–5.0)
Alkaline Phosphatase: 100 U/L (ref 38–126)
Anion gap: 6 (ref 5–15)
BUN: 20 mg/dL (ref 8–23)
CO2: 29 mmol/L (ref 22–32)
Calcium: 9.3 mg/dL (ref 8.9–10.3)
Chloride: 105 mmol/L (ref 98–111)
Creatinine: 1.31 mg/dL — ABNORMAL HIGH (ref 0.61–1.24)
GFR, Estimated: 55 mL/min — ABNORMAL LOW (ref >60)
Glucose, Bld: 227 mg/dL — ABNORMAL HIGH (ref 70–99)
Potassium: 4.7 mmol/L (ref 3.5–5.1)
Sodium: 140 mmol/L (ref 135–145)
Total Bilirubin: 0.7 mg/dL (ref 0.3–1.2)
Total Protein: 6.9 g/dL (ref 6.5–8.1)

## 2020-08-10 NOTE — Progress Notes (Signed)
HEMATOLOGY/ONCOLOGY CLINIC NOTE  Date of Service: 08/10/2020  Patient Care Team: [name], MD as PCP - General (Family Medicine) Satira Sark, MD as PCP - Cardiology (Cardiology) Marilynne Halsted, MD as Referring Physician (Ophthalmology) Brunetta Genera, MD as Consulting Physician (Hematology) Pyrtle, Lajuan Lines, MD as Consulting Physician (Gastroenterology) Marzetta Board, DPM as Consulting Physician (Podiatry) Jonnie Finner, RN as Oncology Nurse Navigator Madelin Rear, [facility] as Pharmacist (Pharmacist)  CHIEF COMPLAINTS/PURPOSE OF CONSULTATION:  Thrombocytopenia  HISTORY OF PRESENTING ILLNESS:   [name] is a wonderful 79 y.o. 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 He is accompanied today by his wife. The pt reports that he is doing well overall.   The pt reports that he has had low energy levels from the past 3-4 years since he retired. Today he feels fatigued as well.  He has been taking Metformin for the last 10 years and has recently begun replacing Vitamin B12. He has neuropathy in his feet and has experienced some falls and uses a cane at night. He is also taking Finasteride  Most recent lab results (02/28/18) of CBC  is as follows: all values are WNL except for RBC at 3.42, HGB at 11.4, HCT at 33.5, Platelets at 127k. Testosterone 02/13/18 was lower at 185  On review of systems, pt reports neuropathy in his feet, some fatigue, stable back pain, some ankle swelling, and denies abdominal pains and any other symptoms.   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 We are joined today by his wife. The patient's last visit  with [place] was on 06/09/2020. The pt reports that he is doing well overall.  The pt reports that he has been feeling well and denies any new symptoms. He maintains that is eating and drinking well. Pt is seeing several specialists for his various medical concerns and notes that the burden of care is somewhat overwhelming at this time.   Of note since the patient's last visit, pt has had MRI Abd ([id number]) completed on 07/07/2020 with results revealing "1. Significant interval increase in size of a subcapsular mass of the lateral anterior left lobe of the liver, hepatic segment IVA, now measuring approximately 4.6 x 3.2 cm, previously 3.5 x 2.8 cm. 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 2. No additional suspicious lesions or contrast enhancement are identified. 3. Mild hypertrophy of the left lobe of the liver and caudate without overt morphologic stigmata of cirrhosis. 4. Splenomegaly."  Lab results today (08/10/20) of CBC w/diff and CMP is as follows: all values are WNL except for RBC at 3.24, Hgb at 11.4, HCT at 33.7, MCV at 104.0, MCH at 35.2, PLT at 88K, Glucose at 227, Creatinine at 1.31, GFR Est at 55. 08/10/2020 AFP tumor marker is in progress  On review of systems, pt reports improving leg swelling, chronic diarrhea and denies low appetite, abdominal pain, constipation, chest pain, worsening SOB and any other symptoms.   MEDICAL HISTORY:  Past Medical History:  Diagnosis Date  . Benign prostatic hypertrophy   . CHF (congestive heart failure) (HCC)    a. EF 20-25% by echo in  11/2019  . COPD (chronic obstructive pulmonary disease) ([place]) December 15, 2009   FEV1 2.30 (70%) ratio 63 no better with B2 and DLCO 18/6 (73%) corrects to 106%  . Degenerative joint disease   . Depression   . Diverticulosis   . Essential hypertension    ACE inhibitor cough  . Fatty liver   . Hyperlipidemia   . Internal hemorrhoids   . Low back pain   . Otitis  externa 07/30/2012  . Splenomegaly   . Type 2 diabetes mellitus ([place])   . Ulcer of foot ([place])    Right foot    SURGICAL HISTORY: Past Surgical History:  Procedure Laterality Date  . IR EMBO TUMOR ORGAN ISCHEMIA INFARCT INC GUIDE ROADMAPPING  06/01/2020  . IR RADIOLOGIST EVAL & MGMT  04/28/2020  . IR RADIOLOGIST EVAL & MGMT  07/15/2020  . NO PAST SURGERIES  1980    SOCIAL HISTORY: Social History   Socioeconomic History  . Marital status: Married    Spouse name: Not on file  . Number of children: Not on file  . Years of education: Not on file  . Highest education level: Not on file  Occupational History  . Occupation: retired    Comment: maintenence work  Tobacco Use  . Smoking status: Former Smoker    Packs/day: 4.00    Years: 20.00    Pack years: 80.00    Types: Cigarettes    Quit date: 09/18/1984    Years since quitting: 35.9  . Smokeless tobacco: Never Used  . Tobacco comment: quit in 1980  Vaping Use  . Vaping Use: Never used  Substance and Sexual Activity  . 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 Drug use: No  . Sexual activity: Yes  Other Topics Concern  . Not on file  Social History Narrative   Married 53 years in 2015. 4 kids (2 sons) and 3 grandkids.    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 Difficulty of Paying Living Expenses: Not hard at all  Food Insecurity: No Food Insecurity  . Worried About Charity fundraiser in the Last Year: Never true  . Ran Out of Food in the Last Year: Never true  Transportation Needs: No Transportation Needs  . Lack of Transportation (Medical): No  . Lack of Transportation (Non-Medical): No  Physical Activity: Inactive  . Days of Exercise per Week: 0 days  . Minutes of Exercise per Session: 0 min  Stress: No Stress  Concern Present  . Feeling of Stress : Not at all  Social Connections: Moderately Integrated  . Frequency of Communication with Friends and Family: More than three times a week  . Frequency of Social Gatherings with Friends and Family: Twice a week  . Attends Religious Services: More than 4 times per year  . Active Member of Clubs or Organizations: No  . Attends Archivist Meetings: Never  . Marital Status: Married  Human resources officer Violence: Not At Risk  . Fear of Current or Ex-Partner: No  . Emotionally Abused: No  . Physically Abused: No  . Sexually Abused: No    FAMILY HISTORY: Family History  Problem Relation Age of  Onset  . Melanoma Mother   . Stroke Father   . CAD Brother        Premature disease    ALLERGIES:  has No Known Allergies.  MEDICATIONS:  Current Outpatient Medications  Medication Sig Dispense Refill  . acyclovir (ZOVIRAX) 400 MG tablet TAKE 1 TABLET TWICE DAILY 180 tablet 1  . albuterol (VENTOLIN HFA) 108 (90 Base) MCG/ACT inhaler Inhale 2 puffs into the lungs every 6 (six) hours as needed for wheezing or shortness of breath. 6.7 g 3  . amitriptyline (ELAVIL) 50 MG tablet TAKE 1 TABLET AT BEDTIME 90 tablet 1  . ASCORBIC ACID PO Take 1 tablet by mouth daily.     Marland Kitchen aspirin EC 81 MG tablet Take 81 mg by mouth daily.    . Blood Glucose Monitoring Suppl (TRUE METRIX AIR GLUCOSE METER) w/Device KIT     . busPIRone (BUSPAR) 5 MG tablet Take 1 tablet (5 mg total) by mouth 2 (two) times daily as needed (anxiety). 60 tablet 5  . carvedilol (COREG) 6.25 MG tablet Take 1 tablet (6.25 mg total) by mouth 2 (two) times daily. 180 tablet 3  . Cholecalciferol 1000 UNITS capsule Take 1 capsule (1,000 Units total) by mouth 2 (two) times daily.    . clotrimazole (LOTRIMIN) 1 % cream For athlete's feet, apply to both feet and between toes twice daily for 4 weeks. (Patient not taking: Reported on 08/03/2020) 45 g 1  . clotrimazole-betamethasone (LOTRISONE) cream Apply 1  application topically 2 (two) times daily. For 7 days maximum for severe jock itch . Stop if worsening symptoms. (Patient not taking: Reported on 08/03/2020) 45 g 1  . escitalopram (LEXAPRO) 5 MG tablet Take 1 tablet (5 mg total) by mouth daily. 90 tablet 5  . famotidine (PEPCID) 20 MG tablet TAKE 1 TABLET TWICE DAILY 180 tablet 1  . finasteride (PROSCAR) 5 MG tablet TAKE 1 TABLET EVERY DAY 90 tablet 1  . furosemide (LASIX) 20 MG tablet TAKE 1 TABLET (20 MG TOTAL) BY MOUTH DAILY. 90 tablet 1  . gabapentin (NEURONTIN) 300 MG capsule Take 1 capsule (300 mg total) by mouth 2 (two) times daily. 180 capsule 3  . glucose blood (TRUE METRIX BLOOD GLUCOSE TEST) test strip TEST BLOOD SUGAR EVERY DAY 100 strip 1  . Multiple Vitamin (MULTIVITAMIN) capsule Take 1 capsule by mouth daily.      . mupirocin ointment (BACTROBAN) 2 % Apply to right foot ulcer once daily and cover with dressing. 30 g 1  . NONFORMULARY OR COMPOUNDED ITEM [facility]  Peripheral Neuropathy Cream- Bupivacaine 1%, Doxepin 3%, Gabapentin 6%, Pentoxifylline 3%, Topiramate 1% Apply 1-2 grams to affected area 3-4 times daily Qty. 120 gm 3 refills    . omeprazole (PRILOSEC) 20 MG capsule TAKE 1 CAPSULE DAILY 30 TO 60 MINUTES BEFORE FIRST MEAL OF THE DAY 90 capsule 1  . rosuvastatin (CRESTOR) 10 MG tablet Take 1 tablet (10 mg total) by mouth daily. 90 tablet 3  . sacubitril-valsartan (ENTRESTO) 49-51 MG Take 1 tablet by mouth 2 (two) times daily. 180 tablet 3  . sodium chloride (MURO 128) 5 % ophthalmic solution Place 1 drop into the left eye at bedtime.     . tamsulosin (FLOMAX) 0.4 MG CAPS capsule TAKE 1 CAPSULE EVERY DAY 90 capsule 1  . TRUEPLUS LANCETS 28G MISC Use to test blood sugars two times daily. Dx: E11.9 200 each 3   No current facility-administered medications for this visit.    REVIEW OF  SYSTEMS:   A 10+ POINT REVIEW OF SYSTEMS WAS OBTAINED including neurology, dermatology, psychiatry, cardiac, respiratory, lymph,  extremities, GI, GU, Musculoskeletal, constitutional, breasts, reproductive, HEENT.  All pertinent positives are noted in the HPI.  All others are negative.   PHYSICAL EXAMINATION:  . Vitals:   08/10/20 1511  BP: (!) 152/75  Pulse: 82  Resp: 18  Temp: 98 F (36.7 C)  SpO2: 97%   Filed Weights   08/10/20 1511  Weight: 207 lb 12.8 oz (94.3 kg)   .Body mass index is 28.18 kg/m.  Exam was given in a chair   GENERAL:alert, in no acute distress and comfortable SKIN: no acute rashes, no significant lesions EYES: conjunctiva are pink and non-injected, sclera anicteric OROPHARYNX: MMM, no exudates, no oropharyngeal erythema or ulceration NECK: supple, no JVD LYMPH:  no palpable lymphadenopathy in the cervical, axillary or inguinal regions LUNGS: clear to auscultation b/l with normal respiratory effort HEART: regular rate & rhythm ABDOMEN:  normoactive bowel sounds , non tender, not distended. No palpable hepatosplenomegaly.  Extremity: no pedal edema PSYCH: alert & oriented x 3 with fluent speech NEURO: no focal motor/sensory deficits  LABORATORY DATA:  I have reviewed the data as listed  . CBC Latest Ref Rng & Units 08/10/2020 08/06/2020 06/09/2020  WBC 4.0 - 10.5 K/uL 6.3 6.1 4.1  Hemoglobin 13.0 - 17.0 g/dL 11.4(L) 11.8(L) 10.9(L)  Hematocrit 39 - 52 % 33.7(L) 34.9(L) 33.5(L)  Platelets 150 - 400 K/uL 88(L) 98(L) 73(L)   . CBC    Component Value Date/Time   WBC 6.3 08/10/2020 1343   RBC 3.24 (L) 08/10/2020 1343   HGB 11.4 (L) 08/10/2020 1343   HGB 10.9 (L) 06/09/2020 0859   HCT 33.7 (L) 08/10/2020 1343   HCT 33.1 (L) 06/21/2018 1332   PLT 88 (L) 08/10/2020 1343   PLT 73 (L) 06/09/2020 0859   MCV 104.0 (H) 08/10/2020 1343   MCH 35.2 (H) 08/10/2020 1343   MCHC 33.8 08/10/2020 1343   RDW 15.0 08/10/2020 1343   LYMPHSABS 1.7 08/10/2020 1343   MONOABS 0.4 08/10/2020 1343   EOSABS 0.1 08/10/2020 1343   BASOSABS 0.0 08/10/2020 1343    . CMP Latest Ref Rng & Units  08/10/2020 06/09/2020 06/01/2020  Glucose 70 - 99 mg/dL 227(H) 175(H) 126(H)  BUN 8 - 23 mg/dL 20 22 26(H)  Creatinine 0.61 - 1.24 mg/dL 1.31(H) 1.33(H) 1.37(H)  Sodium 135 - 145 mmol/L 140 141 139  Potassium 3.5 - 5.1 mmol/L 4.7 5.1 4.6  Chloride 98 - 111 mmol/L 105 107 105  CO2 22 - 32 mmol/L '29 28 24  ' Calcium 8.9 - 10.3 mg/dL 9.3 10.0 9.0  Total Protein 6.5 - 8.1 g/dL 6.9 6.6 6.8  Total Bilirubin 0.3 - 1.2 mg/dL 0.7 0.4 0.4  Alkaline Phos 38 - 126 U/L 100 104 81  AST 15 - 41 U/L 23 26 37  ALT 0 - 44 U/L '17 16 23   ' Component     Latest Ref Rng & Units 06/21/2018  Folate, Hemolysate     Not Estab. ng/mL >620.0  HCT     37.5 - 51.0 % 33.1 (L)  Folate, RBC     >498 ng/mL >1,873  Methylmalonic Acid, Quantitative     0 - 378 nmol/L 294  DISCLAIMER      Comment  Specimen Type      [place]  Lead-Whole Blood     0 - 4 ug/dL 2  Vitamin B12     180 -  914 pg/mL 836  Homocysteine     0.0 - 15.0 umol/L 11.2    02/28/18 Surgical Pathology:   02/28/18 Cytogenetics:    RADIOGRAPHIC STUDIES: I have personally reviewed the radiological images as listed and agreed with the findings in the report. US BIOPSY (LIVER)  Result Date: 08/06/2020 INDICATION: 79 year old male with hepatocellular carcinoma. Unsuccessful attempted trans arterial embolization secondary to occlusive vascular disease. He is now set to undergo SBRT. He presents today for ultrasound-guided placement of fiducials to facilitate lesion localization at the time of SBRT. EXAM: ULTRASOUND PLACEMENT OF FIDUCIAL MARKERS MEDICATIONS: None. ANESTHESIA/SEDATION: Moderate (conscious) sedation was employed during this procedure. A total of Versed 2 mg and Fentanyl 50 mcg was administered intravenously. Moderate Sedation Time: 13 minutes. The patient's level of consciousness and vital signs were monitored continuously by radiology nursing throughout the procedure under my direct supervision. FLUOROSCOPY TIME:  None. COMPLICATIONS: None  immediate. PROCEDURE: Informed written consent was obtained from the patient after a thorough discussion of the procedural risks, benefits and alternatives. All questions were addressed. Maximal Sterile Barrier Technique was utilized including caps, mask, sterile gowns, sterile gloves, sterile drape, hand hygiene and skin antiseptic. A timeout was performed prior to the initiation of the procedure. The liver was interrogated with ultrasound. An ovoid heterogeneous mass in the left hemi liver was successfully identified. The mass measures approximately 4.5 x 3.7 cm. Local anesthesia was attained by infiltration with 1% lidocaine. Under real-time ultrasound guidance, a total of 3 fiducial markers were individually placed, 1 at the medial margin of the mass, 1 at the deep (posterior) margin of the mass, and 1 at the anterior margin of the mass. IMPRESSION: Successful placement of 3 metallic fiducial markers bracketing the patient's known hepatocellular carcinoma. Signed, Criselda Peaches, MD, [facility] Radiology Electronically Signed   By: Jacqulynn Cadet M.D.   On: 08/06/2020 16:48   IR Radiologist Eval & Mgmt  Result Date: 07/15/2020 Please refer to notes tab for details about interventional procedure. (Op Note)   ASSESSMENT & PLAN:   79 y.o. male with  1. Thrombocytopenia  Mild Plt 127k with minimal normocytic anemia. 02/28/18 surgical pathology report with no overt pathology, could not rule out MDS, and noted an increase in ring sideroblasts 02/28/18 BM cytogenetics report was unremarkable  2. 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 2. 4 cm hypervascular mass in segment 4A of left hepatic lobe, and 8 mm hypervascular nodule in posterior  right hepatic lobe. These are highly suspicious for hepatocellular carcinomas in the setting of cirrhosis. 3. No evidence of metastatic disease." -03/27/2020 MRI Abdomen ([id number]) revealed "1. The dominant lesion of concern in the medial segment of the left hepatic lobe (segment 4 A) is diagnostic of hepatocellular carcinoma (LR 5). 2. The other small lesion questioned inferiorly in the right lobe is not clearly seen, although evaluation of this area is mildly limited by motion artifact." PLAN: -Discussed pt labwork today, 08/10/20; all values are WNL except for RBC at 3.24, Hgb at 11.4, HCT at 33.7, MCV at 104.0, MCH at 35.2, PLT at 88K, Glucose at 227, Creatinine at 1.31, GFR Est at 55. -Discussed 08/10/2020 AFP tumor marker is in progress - last AFP was WNL -No overt lab or clinical evidence of significant Hepatocellular Carcinoma progression at this time. -Advised pt  that we would wait 6-8 weeks after the completion of Radiation to repeat imaging, so that we can capture all of the effects. -Recommend pt f/u with Dr. Lisbeth Renshaw as scheduled for SBRT  -Will see back in 2 months with labs    FOLLOW UP: RTC with Dr Irene Limbo with labs in 2 months   The total time spent in the appt was 20 minutes and more than 50% was on counseling and direct patient cares.  All of the patient's questions were answered with apparent satisfaction. The patient knows to call the clinic with any problems, questions or concerns.   (Office):       [phone] (Work cell):  [phone] (Fax):           [phone]  08/10/2020 3:53 PM  I, Yevette Edwards, am acting as a scribe for Dr. Sullivan Lone.   .I have reviewed the above documentation for accuracy and completeness, and I agree with the above. Brunetta Genera MD

## 2020-08-10 NOTE — Progress Notes (Signed)
Has armband been applied?  Yes  Does patient have an allergy to IV contrast dye?: No   Has patient ever received premedication for IV contrast dye?: n/a  Does patient take metformin?: No  If patient does take metformin when was the last dose: n/a  Date of lab work: 08/12/2020 BUN: 19 CR: 1.33 eGfr: 54  IV site: Left Forearm  Has IV site been added to flowsheet?  Yes

## 2020-08-10 NOTE — Progress Notes (Signed)
Chronic Care Management Pharmacy   Name: [name]  MRN: [id number] DOB: 09-13-1941  Chief Complaint/ HPI  [name],  79 y.o., male presents for their Initial CCM visit with the clinical pharmacist via telephone due to COVID-19 Pandemic.  PCP : [name], MD  Chronic conditions include:   Encounter Diagnoses  Name Primary?  . Hyperlipidemia associated with type 2 diabetes mellitus ([place]) Yes  . Controlled type 2 diabetes mellitus with diabetic polyneuropathy, without long-term current use of insulin ([place])   . Recurrent major depressive disorder, in full remission ([place])   . Chronic systolic heart failure ([place])    Office Visits:  02/25/2020 (PCP): Diarrhea following most meals, stop metformin for a few weeks, immodium OTC; buspirone 5 mg twice daily for anxiety.  Patient Active Problem List   Diagnosis Date Noted  . 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 Anxiety 02/25/2020  . 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[place]) 12/14/2019  . Aortic atherosclerosis ([place]) 12/14/2019  . Chronic systolic heart failure ([place]) 12/05/2019  . Diabetic ulcer of right midfoot associated with diabetes mellitus due to underlying condition, with fat layer exposed ([place]) 11/07/2019  . Anemia 03/05/2018  . GERD (gastroesophageal reflux disease) 09/30/2017  . 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 positive 03/29/2016  . Thrombocytopenia ([place]) 12/21/2015  . B12 deficiency 08/20/2015  . 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 Diabetic polyneuropathy associated with type 2 diabetes mellitus ([place]) 08/07/2015  . Diplopia 06/05/2015  . CKD (chronic kidney disease), stage III ([place]) 05/06/2015  . Fatty liver 11/04/2014  . 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 PCO (posterior capsular opacification) 06/19/2013  . 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 Pseudophakia of left eye 04/10/2013  . ILD (interstitial lung disease) ([place]) 07/05/2012  . Nuclear cataract 01/20/2012  . Central opacity of  cornea 01/20/2012  . COPD (chronic obstructive pulmonary disease) ([place]) 12/15/2009  . BPH (benign prostatic hyperplasia) 06/20/2007  . Depression 05/02/2007  . Chronic back pain. Off narcotics 06/05/17 due to negative UDS for opiates x2. Full history 06/12/14. Pain contract signed.  05/02/2007  . DM (diabetes mellitus) type II controlled, neurological manifestation ([place]) 04/06/2007  . Hyperlipidemia associated with type 2 diabetes mellitus ([place]) 04/06/2007  . Essential hypertension 04/06/2007  . Osteoarthritis 04/06/2007   Past Surgical History:  Procedure Laterality Date  . IR EMBO TUMOR ORGAN ISCHEMIA INFARCT INC GUIDE ROADMAPPING  06/01/2020  . IR RADIOLOGIST EVAL & MGMT  04/28/2020  . IR RADIOLOGIST EVAL & MGMT  07/15/2020  . NO PAST SURGERIES  1980   Family History  Problem Relation Age of Onset  . Melanoma Mother   . Stroke Father   . CAD Brother        Premature disease   No Known Allergies Outpatient Encounter Medications as of 08/11/2020  Medication Sig Note  . acyclovir (ZOVIRAX) 400 MG tablet TAKE 1 TABLET TWICE DAILY   . albuterol (VENTOLIN HFA) 108 (90 Base) MCG/ACT inhaler Inhale 2 puffs into the lungs every 6 (six) hours as needed for wheezing or shortness of breath.   Marland Kitchen amitriptyline (ELAVIL) 50 MG tablet TAKE 1 TABLET AT BEDTIME   . ASCORBIC ACID PO Take 1 tablet by mouth daily.    Marland Kitchen aspirin EC 81 MG tablet Take 81 mg by mouth daily.   . Blood Glucose Monitoring Suppl (TRUE METRIX AIR GLUCOSE METER) w/Device KIT    . busPIRone (BUSPAR) 5 MG tablet Take 1 tablet (5 mg total) by mouth 2 (two) times daily as  needed (anxiety).   . carvedilol (COREG) 6.25 MG tablet Take 1 tablet (6.25 mg total) by mouth 2 (two) times daily.   . Cholecalciferol 1000 UNITS capsule Take 1 capsule (1,000 Units total) by mouth 2 (two) times daily.   Marland Kitchen escitalopram (LEXAPRO) 5 MG tablet Take 1 tablet (5 mg total) by mouth daily.   . famotidine (PEPCID) 20 MG tablet TAKE 1 TABLET TWICE DAILY   .  finasteride (PROSCAR) 5 MG tablet TAKE 1 TABLET EVERY DAY   . furosemide (LASIX) 20 MG tablet TAKE 1 TABLET (20 MG TOTAL) BY MOUTH DAILY.   Marland Kitchen gabapentin (NEURONTIN) 300 MG capsule Take 1 capsule (300 mg total) by mouth 2 (two) times daily.   Marland Kitchen glucose blood (TRUE METRIX BLOOD GLUCOSE TEST) test strip TEST BLOOD SUGAR EVERY DAY   . Multiple Vitamin (MULTIVITAMIN) capsule Take 1 capsule by mouth daily.     . mupirocin ointment (BACTROBAN) 2 % Apply to right foot ulcer once daily and cover with dressing.   . NONFORMULARY OR COMPOUNDED ITEM [facility]  Peripheral Neuropathy Cream- Bupivacaine 1%, Doxepin 3%, Gabapentin 6%, Pentoxifylline 3%, Topiramate 1% Apply 1-2 grams to affected area 3-4 times daily Qty. 120 gm 3 refills 08/03/2020: As needed  . omeprazole (PRILOSEC) 20 MG capsule TAKE 1 CAPSULE DAILY 30 TO 60 MINUTES BEFORE FIRST MEAL OF THE DAY   . rosuvastatin (CRESTOR) 10 MG tablet Take 1 tablet (10 mg total) by mouth daily.   . sacubitril-valsartan (ENTRESTO) 49-51 MG Take 1 tablet by mouth 2 (two) times daily.   . sodium chloride (MURO 128) 5 % ophthalmic solution Place 1 drop into the left eye at bedtime.    . tamsulosin (FLOMAX) 0.4 MG CAPS capsule TAKE 1 CAPSULE EVERY DAY   . TRUEPLUS LANCETS 28G MISC Use to test blood sugars two times daily. Dx: E11.9   . clotrimazole (LOTRIMIN) 1 % cream For athlete's feet, apply to both feet and between toes twice daily for 4 weeks.   . clotrimazole-betamethasone (LOTRISONE) cream Apply 1 application topically 2 (two) times daily. For 7 days maximum for severe jock itch . Stop if worsening symptoms. 12/03/2019: Unknown start date    No facility-administered encounter medications on file as of 08/11/2020.   Patient Care Team    Relationship Specialty Notifications Start End  [name], MD PCP - General Family Medicine  06/12/14    Comment: Wanda Plump, Aloha Gell, MD PCP - Cardiology Cardiology  12/26/19   Marilynne Halsted, MD  Referring Physician Ophthalmology  07/03/19   Brunetta Genera, MD Consulting Physician Hematology  07/03/19   Pyrtle, Lajuan Lines, MD Consulting Physician Gastroenterology  07/03/19   Marzetta Board, DPM Consulting Physician Podiatry  07/03/19   Jonnie Finner, RN Oncology Nurse Navigator  All results, Admissions 04/08/20    Comment: Phone:  [phone]   Fax:  [phone]  Madelin Rear, [facility] Pharmacist Pharmacist  05/06/20    Comment: [phone]   Current Diagnosis/Assessment: Goals Addressed            This Visit's Progress   . PharmD Care Plan       CARE PLAN ENTRY (see longitudinal plan of care for additional care plan information)  Current Barriers:  . Chronic Disease Management support, education, and care coordination needs related to Hypertension, Hyperlipidemia, Diabetes, and Depression   Heart Failure / High Blood Pressure BP Readings from Last 3 Encounters:  08/10/20 (!) 152/75  08/06/20 136/66  07/23/20 136/62   .  Pharmacist Clinical Goal(s): o Over the next 180 days, patient will work with PharmD and providers to - Help maintain BP goal <140/90 - Ensure medication affordability and accessibility . Current regimen:  . Entresto 49-51 mg twice daily  . Carvedilol 6.25 mg twice daily  . Interventions: o Home BP recommendations o Reviewed daily weights o Diet recommendations . Patient self care activities - Over the next 180 days, patient will: o Check BP at least 1-2x/week, document, and provide at future appointments o We discussed weighing daily; if you gain more than 3 pounds in one day or 5 pounds in one week call your doctor. o Ensure daily salt intake < 2300 mg/day  Hyperlipidemia Lab Results  Component Value Date/Time   LDLCALC 49 11/07/2019 11:46 AM   LDLDIRECT 57.0 10/22/2018 02:51 PM   . Pharmacist Clinical Goal(s): o Over the next 180 days, patient will work with PharmD and providers to maintain LDL goal < 70 . Current regimen:  o Crestor  10 mg once daily . Interventions: o Diet recommendations - see high blood pressure / heart failure. . Patient self care activities - Over the next 180 days, patient will: o See high blood pressure / heart failure.  Diabetes Lab Results  Component Value Date/Time   HGBA1C 5.9 (A) 02/25/2020 01:37 PM   HGBA1C 5.9 (A) 11/07/2019 11:09 AM   HGBA1C 7.1 (H) 10/22/2018 02:51 PM   HGBA1C 7.7 (H) 09/28/2017 02:14 PM   . Pharmacist Clinical Goal(s): o Over the next 180 days, patient will work with PharmD and providers to maintain A1c goal <6.5% . Current regimen:  o N/a . Interventions: o Diet recommendations o Discussed follow-up with PCP and resuming metformin as appropriate . Patient self care activities - Over the next 180 days, patient will: o Check blood sugar as directed, document, and provide at future appointments o Continue to minimize intake of moods high in sugar / carbohydrates  Depression/Anxiety . Pharmacist Clinical Goal(s) o Over the next 180 days, patient will work with PharmD and providers to minimize symptoms of depression, ensure medication safety . Current regimen:  o Escitalopram 5 mg once daily o Buspar 5 mg twice daily as needed for anxiety . Interventions: o Reviewed side effects . Patient self care activities - Over the next 180 days, patient will: o Continue current management  Medication management . Pharmacist Clinical Goal(s): o Over the next 180 days, patient will work with PharmD and providers to maintain optimal medication adherence . Current pharmacy: [facility] . Interventions o Comprehensive medication review performed. o Continue current medication management strategy . Patient self care activities - Over the next 180 days, patient will: o Take medications as prescribed o Report any questions or concerns to PharmD and/or provider(s) Initial goal documentation.      Diabetes   A1c goal < 6.5%  Lab Results  Component  Value Date/Time   HGBA1C 5.9 (A) 02/25/2020 01:37 PM   HGBA1C 5.9 (A) 11/07/2019 11:09 AM   HGBA1C 7.1 (H) 10/22/2018 02:51 PM   HGBA1C 7.7 (H) 09/28/2017 02:14 PM   MICROALBUR 60.3 (H) 01/29/2018 12:52 PM   MICROALBUR 7.8 (H) 12/01/2015 09:40 AM  Foot exam 07/2020.   Checking BG: ~2x/week. Recent FBG readings 120s-130s Metformin stopped 02/2020 due to c/o of diarrhea.  Previous medications: metformin, glyburide.   Patient is currently at goal on the following medications:  . N/a - no medications at this time. Unable to assess due to metformin being stopped at  last visit.  We discussed: diet and exercise extensively - choosing white meals (chicken, fish, ground [place] over red meats), exercising as tolerated and appropriate.   Plan  Continue current medications and control with diet and exercise.  Heart Failure   Type: Systolic. Last ejection fraction: 20-25% 11/2019. Improved to 50-55% 06/2020. Denies recent weight gain of >3lbs overnight continues to perform daily weights. Patient home BP readings are ranging: 130s-140s/70s.Had been started on samples of Entresto, has some concern with price. Previous medications lisinopril, losartan, olmesartan.  Patient is currently on the following medications:  . Entresto 49-51 mg twice daily  . Carvedilol 6.25 mg twice daily  . Furosemide 20 mg once daily   We discussed weighing daily; if you gain more than 3 pounds in one day or 5 pounds in one week call your doctor.  Plan  Continue current medications.  Hyperlipidemia   LDL goal < 70  Lipid Panel     Component Value Date/Time   CHOL 120 11/07/2019 1146   TRIG 126.0 11/07/2019 1146   HDL 45.60 11/07/2019 1146   LDLCALC 49 11/07/2019 1146   LDLDIRECT 57.0 10/22/2018 1451    Hepatic Function Latest Ref Rng & Units 08/10/2020 06/09/2020 06/01/2020  Total Protein 6.5 - 8.1 g/dL 6.9 6.6 6.8  Albumin 3.5 - 5.0 g/dL 3.8 3.7 3.9  AST 15 - 41 U/L 23 26 37  ALT 0 - 44 U/L '17 16 23   ' Alk Phosphatase 38 - 126 U/L 100 104 81  Total Bilirubin 0.3 - 1.2 mg/dL 0.7 0.4 0.4  Bilirubin, Direct 0.0 - 0.2 mg/dL - - -    Patient has failed these meds in past: n/a. Patient is currently at goal on the following medications:  . Crestor 10 mg once daily  We discussed:  diet and exercise extensively.  Plan  Continue current medications.  GERD   Patient denies recent acid reflux.  Currently controlled on: . Omeprazole 20 mg 30-60 minutes before first meal of the day  . Famotidine 20 mg twice daily   We discussed: Avoidance of potential triggers such as chocolate, citrus juices, fatty foods and lying down after eating.  Plan   Continue current medications.  Depression/anxiety   PHQ9 SCORE ONLY 08/11/2020 08/03/2020 08/03/2020  PHQ-9 Total Score 0 2 0   Denies side effects, reports affordability/accessibility. Previous medications: fluoxetine.   Patient is currently controlled on the following medications:  . Escitalopram 5 mg once daily . buspar 5 mg twice daily as needed for anxiety . Amitriptyline 50 mg once daily at bedtime  Reviewed potential side effects of current medications.  Potential changes not discussed due to pt focus upcoming hepatocellular carcinoma tx.  Plan  Continue current medications. Could consider switch to sertraline and reducing/stopping amitriptyline at f/u.  Vaccines   Immunization History  Administered Date(s) Administered  . Influenza Split 09/07/2011, 07/30/2012  . Influenza Whole 10/10/2005, 08/31/2009, 07/10/2010  . Influenza, High Dose Seasonal PF 06/22/2016, 06/29/2017, 09/20/2018, 07/11/2019  . Influenza,inj,Quad PF,6+ Mos 07/01/2013, 06/20/2014  . Influenza-Unspecified 10/26/2015, 09/12/2018  . Meningococcal Conjugate 07/24/2014  . Moderna SARS-COVID-2 Vaccination 10/22/2019, 12/02/2019  . Pneumococcal Conjugate-13 07/24/2014  . Pneumococcal Polysaccharide-23 08/31/2009  . Td 06/10/2006  . Zoster Recombinat (Shingrix)  12/11/2018, 05/03/2019   Plan  Recommended patient receive covid booster vaccine.  Medication Management Coordination   Receives prescription medications from:  [facility], [address] [place] [address] Phone: [phone] Fax:  [phone]  Denies any issues with current medication management.  Pt plans to schedule f/u w/ PCP in near future, states he wants to start tx for hepatocellular carcinoma prior to scheduling anything.  Plan  Continue current medication management strategy. ___________________________ SDOH (Social Determinants of Health) assessments performed: Yes.  Future Appointments  Date Time Provider [place]  08/12/2020 11:15 AM [facility] LAB [facility] None  08/12/2020 12:15 PM [facility] NURSE [facility] None  08/12/2020  1:00 PM Kyung Rudd, MD [facility] None  08/18/2020 10:30 AM Kyung Rudd, MD [facility] None  08/20/2020 12:15 PM Kyung Rudd, MD [facility] None  08/24/2020 11:45 AM Kyung Rudd, MD [facility] None  08/26/2020  2:15 PM Kyung Rudd, MD [place] None  08/28/2020  2:15 PM Kyung Rudd, MD [place] None  09/01/2020 11:15 AM Criselda Peaches, DPM [facility]  10/12/2020  1:00 PM [facility] LAB [facility] None  10/12/2020  1:40 PM Brunetta Genera, MD [facility] None  11/11/2020  1:00 PM [facility] CCM PHARMACIST [facility]  12/14/2020  1:40 PM Satira Sark, MD [facility]  08/09/2021  1:00 PM [facility] HEALTH COACH [facility]   Visit follow-up:  . CPA follow-up: PAP application for Entresto. Marland Kitchen RPH follow-up: 3 month telephone visit, review changes, help coordinate any f/u.  Madelin Rear, Pharm.D., BCGP Clinical Pharmacist [facility] [phone]

## 2020-08-10 NOTE — Telephone Encounter (Signed)
CALLED PATIENT TO INFORM OF LAB ON 08-12-20 , LINE BUSY, WILL CALL LATER

## 2020-08-11 ENCOUNTER — Telehealth: Payer: Self-pay | Admitting: *Deleted

## 2020-08-11 ENCOUNTER — Ambulatory Visit: Payer: Medicare HMO

## 2020-08-11 DIAGNOSIS — E1169 Type 2 diabetes mellitus with other specified complication: Secondary | ICD-10-CM

## 2020-08-11 DIAGNOSIS — F3342 Major depressive disorder, recurrent, in full remission: Secondary | ICD-10-CM

## 2020-08-11 DIAGNOSIS — E1142 Type 2 diabetes mellitus with diabetic polyneuropathy: Secondary | ICD-10-CM

## 2020-08-11 DIAGNOSIS — I5022 Chronic systolic (congestive) heart failure: Secondary | ICD-10-CM

## 2020-08-11 LAB — AFP TUMOR MARKER: AFP, Serum, Tumor Marker: 4.7 ng/mL (ref 0.0–8.3)

## 2020-08-11 NOTE — Patient Instructions (Signed)
Please review care plan below and call me at [phone] with any questions!  Thank you, Edyth Gunnels., Clinical Pharmacist  Goals Addressed            This Visit's Progress   . PharmD Care Plan       CARE PLAN ENTRY (see longitudinal plan of care for additional care plan information)  Current Barriers:  . Chronic Disease Management support, education, and care coordination needs related to Hypertension, Hyperlipidemia, Diabetes, and Depression   Heart Failure / High Blood Pressure BP Readings from Last 3 Encounters:  08/10/20 (!) 152/75  08/06/20 136/66  07/23/20 136/62   . Pharmacist Clinical Goal(s): o Over the next 180 days, patient will work with PharmD and providers to - Help maintain BP goal <140/90 - Ensure medication affordability and accessibility . Current regimen:  . Entresto 49-51 mg twice daily  . Carvedilol 6.25 mg twice daily  . Interventions: o Home BP recommendations o Reviewed daily weights o Diet recommendations . Patient self care activities - Over the next 180 days, patient will: o Check BP at least 1-2x/week, document, and provide at future appointments o We discussed weighing daily; if you gain more than 3 pounds in one day or 5 pounds in one week call your doctor. o Ensure daily salt intake < 2300 mg/day  Hyperlipidemia Lab Results  Component Value Date/Time   LDLCALC 49 11/07/2019 11:46 AM   LDLDIRECT 57.0 10/22/2018 02:51 PM   . Pharmacist Clinical Goal(s): o Over the next 180 days, patient will work with PharmD and providers to maintain LDL goal < 70 . Current regimen:  o Crestor 10 mg once daily . Interventions: o Diet recommendations - see high blood pressure / heart failure. . Patient self care activities - Over the next 180 days, patient will: o See high blood pressure / heart failure.  Diabetes Lab Results  Component Value Date/Time   HGBA1C 5.9 (A) 02/25/2020 01:37 PM   HGBA1C 5.9 (A) 11/07/2019 11:09 AM   HGBA1C 7.1 (H)  10/22/2018 02:51 PM   HGBA1C 7.7 (H) 09/28/2017 02:14 PM   . Pharmacist Clinical Goal(s): o Over the next 180 days, patient will work with PharmD and providers to maintain A1c goal <6.5% . Current regimen:  o N/a . Interventions: o Diet recommendations o Discussed follow-up with PCP and resuming metformin as appropriate . Patient self care activities - Over the next 180 days, patient will: o Check blood sugar as directed, document, and provide at future appointments o Continue to minimize intake of moods high in sugar / carbohydrates  Depression/Anxiety . Pharmacist Clinical Goal(s) o Over the next 180 days, patient will work with PharmD and providers to minimize symptoms of depression, ensure medication safety . Current regimen:  o Escitalopram 5 mg once daily o Buspar 5 mg twice daily as needed for anxiety . Interventions: o Reviewed side effects . Patient self care activities - Over the next 180 days, patient will: o Continue current management  Medication management . Pharmacist Clinical Goal(s): o Over the next 180 days, patient will work with PharmD and providers to maintain optimal medication adherence . Current pharmacy: [facility] . Interventions o Comprehensive medication review performed. o Continue current medication management strategy . Patient self care activities - Over the next 180 days, patient will: o Take medications as prescribed o Report any questions or concerns to PharmD and/or provider(s) Initial goal documentation.      The patient verbalized understanding of instructions provided today  and agreed to receive a mailed copy of patient instruction and/or educational materials. Telephone follow up appointment with pharmacy team member scheduled for: See next appointment with "Care Management Staff" under "What's Next" below.   Madelin Rear, Pharm.D., BCGP Clinical Pharmacist [facility] [phone]  Diabetes Mellitus and  Nutrition, Adult When you have diabetes (diabetes mellitus), it is very important to have healthy eating habits because your blood sugar (glucose) levels are greatly affected by what you eat and drink. Eating healthy foods in the appropriate amounts, at about the same times every day, can help you:  Control your blood glucose.  Lower your risk of heart disease.  Improve your blood pressure.  Reach or maintain a healthy weight. Every person with diabetes is different, and each person has different needs for a meal plan. Your health care provider may recommend that you work with a diet and nutrition specialist (dietitian) to make a meal plan that is best for you. Your meal plan may vary depending on factors such as:  The calories you need.  The medicines you take.  Your weight.  Your blood glucose, blood pressure, and cholesterol levels.  Your activity level.  Other health conditions you have, such as heart or kidney disease. How do carbohydrates affect me? Carbohydrates, also called carbs, affect your blood glucose level more than any other type of food. Eating carbs naturally raises the amount of glucose in your blood. Carb counting is a method for keeping track of how many carbs you eat. Counting carbs is important to keep your blood glucose at a healthy level, especially if you use insulin or take certain oral diabetes medicines. It is important to know how many carbs you can safely have in each meal. This is different for every person. Your dietitian can help you calculate how many carbs you should have at each meal and for each snack. Foods that contain carbs include:  Bread, cereal, rice, pasta, and crackers.  Potatoes and corn.  Peas, beans, and lentils.  Milk and yogurt.  Fruit and juice.  Desserts, such as cakes, cookies, ice cream, and candy. How does alcohol affect me? Alcohol can cause a sudden decrease in blood glucose (hypoglycemia), especially if you use insulin  or take certain oral diabetes medicines. Hypoglycemia can be a life-threatening condition. Symptoms of hypoglycemia (sleepiness, dizziness, and confusion) are similar to symptoms of having too much alcohol. If your health care provider says that alcohol is safe for you, follow these guidelines:  Limit alcohol intake to no more than 1 drink per day for nonpregnant women and 2 drinks per day for men. One drink equals 12 oz of beer, 5 oz of wine, or 1 oz of hard liquor.  Do not drink on an empty stomach.  Keep yourself hydrated with water, diet soda, or unsweetened iced tea.  Keep in mind that regular soda, juice, and other mixers may contain a lot of sugar and must be counted as carbs. What are tips for following this plan?  Reading food labels  Start by checking the serving size on the "Nutrition Facts" label of packaged foods and drinks. The amount of calories, carbs, fats, and other nutrients listed on the label is based on one serving of the item. Many items contain more than one serving per package.  Check the total grams (g) of carbs in one serving. You can calculate the number of servings of carbs in one serving by dividing the total carbs by 15. For example,  if a food has 30 g of total carbs, it would be equal to 2 servings of carbs.  Check the number of grams (g) of saturated and trans fats in one serving. Choose foods that have low or no amount of these fats.  Check the number of milligrams (mg) of salt (sodium) in one serving. Most people should limit total sodium intake to less than 2,300 mg per day.  Always check the nutrition information of foods labeled as "low-fat" or "nonfat". These foods may be higher in added sugar or refined carbs and should be avoided.  Talk to your dietitian to identify your daily goals for nutrients listed on the label. Shopping  Avoid buying canned, premade, or processed foods. These foods tend to be high in fat, sodium, and added sugar.  Shop  around the outside edge of the grocery store. This includes fresh fruits and vegetables, bulk grains, fresh meats, and fresh dairy. Cooking  Use low-heat cooking methods, such as baking, instead of high-heat cooking methods like deep frying.  Cook using healthy oils, such as olive, canola, or sunflower oil.  Avoid cooking with butter, cream, or high-fat meats. Meal planning  Eat meals and snacks regularly, preferably at the same times every day. Avoid going long periods of time without eating.  Eat foods high in fiber, such as fresh fruits, vegetables, beans, and whole grains. Talk to your dietitian about how many servings of carbs you can eat at each meal.  Eat 4-6 ounces (oz) of lean protein each day, such as lean meat, chicken, fish, eggs, or tofu. One oz of lean protein is equal to: ? 1 oz of meat, chicken, or fish. ? 1 egg. ?  cup of tofu.  Eat some foods each day that contain healthy fats, such as avocado, nuts, seeds, and fish. Lifestyle  Check your blood glucose regularly.  Exercise regularly as told by your health care provider. This may include: ? 150 minutes of moderate-intensity or vigorous-intensity exercise each week. This could be brisk walking, biking, or water aerobics. ? Stretching and doing strength exercises, such as yoga or weightlifting, at least 2 times a week.  Take medicines as told by your health care provider.  Do not use any products that contain nicotine or tobacco, such as cigarettes and e-cigarettes. If you need help quitting, ask your health care provider.  Work with a Social worker or diabetes educator to identify strategies to manage stress and any emotional and social challenges. Questions to ask a health care provider  Do I need to meet with a diabetes educator?  Do I need to meet with a dietitian?  What number can I call if I have questions?  When are the best times to check my blood glucose? Where to find more information:  American  Diabetes Association: diabetes.org  Academy of Nutrition and Dietetics: www.eatright.[employer] of Diabetes and Digestive and Kidney Diseases (NIH): [employer] Summary  A healthy meal plan will help you control your blood glucose and maintain a healthy lifestyle.  Working with a diet and nutrition specialist (dietitian) can help you make a meal plan that is best for you.  Keep in mind that carbohydrates (carbs) and alcohol have immediate effects on your blood glucose levels. It is important to count carbs and to use alcohol carefully. This information is not intended to replace advice given to you by your health care provider. Make sure you discuss any questions you have with your health care provider. Document Revised: 09/08/2017  Document Reviewed: 10/31/2016 Elsevier Patient Education  [employer].

## 2020-08-11 NOTE — Telephone Encounter (Signed)
Called patient to inform to be here tomorrow (08/12/20) @ 11:15 am for lab, spoke with patient and he is aware of this appt.

## 2020-08-12 ENCOUNTER — Ambulatory Visit
Admission: RE | Admit: 2020-08-12 | Discharge: 2020-08-12 | Disposition: A | Discharge status: Home / Self Care | Payer: Medicare HMO | Source: Ambulatory Visit | Attending: Radiation Oncology | Admitting: Radiation Oncology

## 2020-08-12 ENCOUNTER — Other Ambulatory Visit: Payer: Self-pay

## 2020-08-12 ENCOUNTER — Other Ambulatory Visit: Payer: Self-pay | Admitting: Radiology

## 2020-08-12 VITALS — BP 142/61 | HR 66 | Resp 18

## 2020-08-12 DIAGNOSIS — C220 Liver cell carcinoma: Secondary | ICD-10-CM | POA: Insufficient documentation

## 2020-08-12 DIAGNOSIS — Z510 Encounter for antineoplastic radiation therapy: Secondary | ICD-10-CM | POA: Diagnosis not present

## 2020-08-12 LAB — BASIC METABOLIC PANEL - CANCER CENTER ONLY
Anion gap: 8 (ref 5–15)
BUN: 19 mg/dL (ref 8–23)
CO2: 28 mmol/L (ref 22–32)
Calcium: 9.5 mg/dL (ref 8.9–10.3)
Chloride: 103 mmol/L (ref 98–111)
Creatinine: 1.33 mg/dL — ABNORMAL HIGH (ref 0.61–1.24)
GFR, Estimated: 54 mL/min — ABNORMAL LOW (ref >60)
Glucose, Bld: 169 mg/dL — ABNORMAL HIGH (ref 70–99)
Potassium: 4.5 mmol/L (ref 3.5–5.1)
Sodium: 139 mmol/L (ref 135–145)

## 2020-08-12 MED ORDER — SODIUM CHLORIDE 0.9% FLUSH
10.0000 mL | Freq: Once | INTRAVENOUS | Status: AC
  Administered 2020-08-12: 10 mL via INTRAVENOUS

## 2020-08-17 ENCOUNTER — Other Ambulatory Visit: Payer: Self-pay | Admitting: Family Medicine

## 2020-08-18 ENCOUNTER — Other Ambulatory Visit: Payer: Self-pay

## 2020-08-18 ENCOUNTER — Ambulatory Visit
Admission: RE | Admit: 2020-08-18 | Discharge: 2020-08-18 | Disposition: A | Discharge status: Home / Self Care | Payer: Medicare HMO | Source: Ambulatory Visit | Attending: Radiation Oncology | Admitting: Radiation Oncology

## 2020-08-18 DIAGNOSIS — C220 Liver cell carcinoma: Secondary | ICD-10-CM | POA: Diagnosis not present

## 2020-08-18 DIAGNOSIS — Z510 Encounter for antineoplastic radiation therapy: Secondary | ICD-10-CM | POA: Diagnosis not present

## 2020-08-20 ENCOUNTER — Ambulatory Visit
Admission: RE | Admit: 2020-08-20 | Discharge: 2020-08-20 | Disposition: A | Discharge status: Home / Self Care | Payer: Medicare HMO | Source: Ambulatory Visit | Attending: Radiation Oncology | Admitting: Radiation Oncology

## 2020-08-20 DIAGNOSIS — C220 Liver cell carcinoma: Secondary | ICD-10-CM | POA: Diagnosis not present

## 2020-08-20 DIAGNOSIS — Z510 Encounter for antineoplastic radiation therapy: Secondary | ICD-10-CM | POA: Diagnosis not present

## 2020-08-24 ENCOUNTER — Other Ambulatory Visit: Payer: Self-pay

## 2020-08-24 ENCOUNTER — Ambulatory Visit
Admission: RE | Admit: 2020-08-24 | Discharge: 2020-08-24 | Disposition: A | Discharge status: Home / Self Care | Payer: Medicare HMO | Source: Ambulatory Visit | Attending: Radiation Oncology | Admitting: Radiation Oncology

## 2020-08-24 DIAGNOSIS — C220 Liver cell carcinoma: Secondary | ICD-10-CM | POA: Diagnosis not present

## 2020-08-24 DIAGNOSIS — Z510 Encounter for antineoplastic radiation therapy: Secondary | ICD-10-CM | POA: Diagnosis not present

## 2020-08-25 ENCOUNTER — Ambulatory Visit: Payer: Medicare HMO | Admitting: Radiation Oncology

## 2020-08-25 DIAGNOSIS — J449 Chronic obstructive pulmonary disease, unspecified: Secondary | ICD-10-CM | POA: Diagnosis not present

## 2020-08-25 DIAGNOSIS — J9611 Chronic respiratory failure with hypoxia: Secondary | ICD-10-CM | POA: Diagnosis not present

## 2020-08-26 ENCOUNTER — Ambulatory Visit
Admission: RE | Admit: 2020-08-26 | Discharge: 2020-08-26 | Disposition: A | Discharge status: Home / Self Care | Payer: Medicare HMO | Source: Ambulatory Visit | Attending: Radiation Oncology | Admitting: Radiation Oncology

## 2020-08-26 ENCOUNTER — Ambulatory Visit: Payer: Medicare HMO | Admitting: Radiation Oncology

## 2020-08-26 DIAGNOSIS — Z510 Encounter for antineoplastic radiation therapy: Secondary | ICD-10-CM | POA: Diagnosis not present

## 2020-08-26 DIAGNOSIS — C220 Liver cell carcinoma: Secondary | ICD-10-CM | POA: Diagnosis not present

## 2020-08-27 ENCOUNTER — Ambulatory Visit: Payer: Medicare HMO | Admitting: Radiation Oncology

## 2020-08-28 ENCOUNTER — Ambulatory Visit: Payer: Medicare HMO | Admitting: Radiation Oncology

## 2020-08-28 ENCOUNTER — Other Ambulatory Visit: Payer: Self-pay

## 2020-08-28 ENCOUNTER — Encounter: Payer: Self-pay | Admitting: Radiation Oncology

## 2020-08-28 ENCOUNTER — Ambulatory Visit
Admission: RE | Admit: 2020-08-28 | Discharge: 2020-08-28 | Disposition: A | Discharge status: Home / Self Care | Payer: Medicare HMO | Source: Ambulatory Visit | Attending: Radiation Oncology | Admitting: Radiation Oncology

## 2020-08-28 DIAGNOSIS — C220 Liver cell carcinoma: Secondary | ICD-10-CM | POA: Diagnosis not present

## 2020-08-28 DIAGNOSIS — Z510 Encounter for antineoplastic radiation therapy: Secondary | ICD-10-CM | POA: Diagnosis not present

## 2020-08-31 ENCOUNTER — Ambulatory Visit: Payer: Medicare HMO | Admitting: Radiation Oncology

## 2020-09-01 ENCOUNTER — Ambulatory Visit: Payer: Medicare HMO | Admitting: Podiatry

## 2020-09-01 ENCOUNTER — Encounter: Payer: Self-pay | Admitting: Podiatry

## 2020-09-01 ENCOUNTER — Other Ambulatory Visit: Payer: Self-pay

## 2020-09-01 DIAGNOSIS — B353 Tinea pedis: Secondary | ICD-10-CM

## 2020-09-01 DIAGNOSIS — L84 Corns and callosities: Secondary | ICD-10-CM

## 2020-09-01 DIAGNOSIS — E1142 Type 2 diabetes mellitus with diabetic polyneuropathy: Secondary | ICD-10-CM | POA: Diagnosis not present

## 2020-09-01 DIAGNOSIS — B351 Tinea unguium: Secondary | ICD-10-CM | POA: Diagnosis not present

## 2020-09-01 DIAGNOSIS — M79674 Pain in right toe(s): Secondary | ICD-10-CM | POA: Diagnosis not present

## 2020-09-01 DIAGNOSIS — M79675 Pain in left toe(s): Secondary | ICD-10-CM | POA: Diagnosis not present

## 2020-09-01 NOTE — Progress Notes (Signed)
  Subjective:  Patient ID: [name], male    DOB: 1941-07-31,  MRN: [id number]  Chief Complaint  Patient presents with  . Foot Ulcer    right foot ulcer . PT stated that he is doing okay but does have some pain when he walks on it     79 y.o. male presents with the above complaint. History confirmed with patient.  He is a preulcerative callus under the right great toe.  It was open once but is healed.  His dry scaling rash that he has been taking clotrimazole cream for from Dr. Elisha Ponder.  He thinks it is improving.  Toenails are thickened elongated and painful.  Objective:  Physical Exam: warm, good capillary refill, no trophic changes or ulcerative lesions, normal DP and PT pulses, normal sensory exam and onychomycosis x10, he has tinea pedis in moccasin distribution on the plantar bilateral foot.  He has a preulcerative callus on submet 1 right foot. Assessment:   1. Pain due to onychomycosis of toenails of both feet   2. Diabetic peripheral neuropathy associated with type 2 diabetes mellitus (HCC)   3. Callus   4. Tinea pedis of both feet      Plan:  Patient was evaluated and treated and all questions answered.  Patient educated on diabetes. Discussed proper diabetic foot care and discussed risks and complications of disease. Educated patient in depth on reasons to return to the office immediately should he/she discover anything concerning or new on the feet. All questions answered. Discussed proper shoes as well.   Discussed the etiology and treatment options for the condition in detail with the patient. Educated patient on the topical and oral treatment options for mycotic nails. Recommended debridement of the nails today. Sharp and mechanical debridement performed of all painful and mycotic nails today. Nails debrided in length and thickness using a nail nipper and a mechanical burr to level of comfort. Discussed treatment options including appropriate shoe gear. Follow up as  needed for painful nails.  All symptomatic hyperkeratoses were safely debrided with a sterile #15 blade to patient's level of comfort without incident. We discussed preventative and palliative care of these lesions including supportive and accommodative shoegear, padding, prefabricated and custom molded accommodative orthoses, use of a pumice stone and lotions/creams daily.  For the tinea pedis I discussed with him that he should continues with Lotrimin cream, I also discussed foot hygiene and keeping the feet clean and spring the inside of the shoes and antifungal spray.  Return in about 3 months (around 12/02/2020).

## 2020-09-02 ENCOUNTER — Ambulatory Visit: Payer: Medicare HMO | Admitting: Radiation Oncology

## 2020-09-04 ENCOUNTER — Ambulatory Visit: Payer: Medicare HMO | Admitting: Radiation Oncology

## 2020-09-08 ENCOUNTER — Ambulatory Visit: Payer: Medicare HMO | Admitting: Radiation Oncology

## 2020-09-21 ENCOUNTER — Telehealth: Payer: Self-pay | Admitting: Radiation Oncology

## 2020-09-21 NOTE — Telephone Encounter (Signed)
  Radiation Oncology         [phone]) [phone] ________________________________  Name: [name] MRN: [id number]  Date of Service: 09/21/2020  DOB: Mar 15, 1941  Post Treatment Telephone Note  Diagnosis: Hepatocellular Carcinoma  Interval Since Last Radiation: 4 weeks   08/18/20-08/28/20 SBRT Treatment: The left liver target was treated to 60 Gy in 5 fractions  Narrative:  The patient was contacted today for routine follow-up. During treatment he did very well with radiotherapy and did not have significant desquamation.   Impression/Plan: 1. Hepatocellular Carcinoma. I tried reaching the patient today and he does not have voicemail set up. We would be happy to see if we can answer any questions that come up and  follow him as needed, but he will also continue to follow up with Dr. Irene Limbo in medical oncology and plans to see him on 10/12/20.     Carola Rhine, PAC

## 2020-09-24 DIAGNOSIS — J9611 Chronic respiratory failure with hypoxia: Secondary | ICD-10-CM | POA: Diagnosis not present

## 2020-09-24 DIAGNOSIS — J449 Chronic obstructive pulmonary disease, unspecified: Secondary | ICD-10-CM | POA: Diagnosis not present

## 2020-10-08 ENCOUNTER — Other Ambulatory Visit: Payer: Self-pay | Admitting: *Deleted

## 2020-10-08 ENCOUNTER — Telehealth: Payer: Self-pay | Admitting: Hematology

## 2020-10-08 DIAGNOSIS — C220 Liver cell carcinoma: Secondary | ICD-10-CM

## 2020-10-08 DIAGNOSIS — D696 Thrombocytopenia, unspecified: Secondary | ICD-10-CM

## 2020-10-08 NOTE — Telephone Encounter (Signed)
Called patient regarding upcoming appointments, spoke with patient's wife. Patient will be notified of upcoming appointments.

## 2020-10-12 ENCOUNTER — Inpatient Hospital Stay: Payer: Medicare HMO | Admitting: Hematology

## 2020-10-12 ENCOUNTER — Other Ambulatory Visit: Payer: Self-pay

## 2020-10-12 ENCOUNTER — Inpatient Hospital Stay: Payer: Medicare HMO | Attending: Hematology

## 2020-10-12 VITALS — BP 157/75 | HR 84 | Temp 98.2°F | Resp 18 | Ht 72.0 in | Wt 210.1 lb

## 2020-10-12 DIAGNOSIS — D696 Thrombocytopenia, unspecified: Secondary | ICD-10-CM

## 2020-10-12 DIAGNOSIS — E538 Deficiency of other specified B group vitamins: Secondary | ICD-10-CM | POA: Insufficient documentation

## 2020-10-12 DIAGNOSIS — Z79899 Other long term (current) drug therapy: Secondary | ICD-10-CM | POA: Insufficient documentation

## 2020-10-12 DIAGNOSIS — C220 Liver cell carcinoma: Secondary | ICD-10-CM

## 2020-10-12 LAB — CMP (CANCER CENTER ONLY)
ALT: 15 U/L (ref 0–44)
AST: 25 U/L (ref 15–41)
Albumin: 3.7 g/dL (ref 3.5–5.0)
Alkaline Phosphatase: 100 U/L (ref 38–126)
Anion gap: 4 — ABNORMAL LOW (ref 5–15)
BUN: 17 mg/dL (ref 8–23)
CO2: 29 mmol/L (ref 22–32)
Calcium: 9.4 mg/dL (ref 8.9–10.3)
Chloride: 108 mmol/L (ref 98–111)
Creatinine: 1.21 mg/dL (ref 0.61–1.24)
GFR, Estimated: >60 mL/min (ref >60)
Glucose, Bld: 163 mg/dL — ABNORMAL HIGH (ref 70–99)
Potassium: 4.6 mmol/L (ref 3.5–5.1)
Sodium: 141 mmol/L (ref 135–145)
Total Bilirubin: 0.7 mg/dL (ref 0.3–1.2)
Total Protein: 6.9 g/dL (ref 6.5–8.1)

## 2020-10-12 LAB — CBC WITH DIFFERENTIAL (CANCER CENTER ONLY)
Abs Immature Granulocytes: 0.01 10*3/uL (ref 0.00–0.07)
Basophils Absolute: 0 10*3/uL (ref 0.0–0.1)
Basophils Relative: 1 %
Eosinophils Absolute: 0.1 10*3/uL (ref 0.0–0.5)
Eosinophils Relative: 2 %
HCT: 34.5 % — ABNORMAL LOW (ref 39.0–52.0)
Hemoglobin: 11.5 g/dL — ABNORMAL LOW (ref 13.0–17.0)
Immature Granulocytes: 0 %
Lymphocytes Relative: 18 %
Lymphs Abs: 0.9 10*3/uL (ref 0.7–4.0)
MCH: 34 pg (ref 26.0–34.0)
MCHC: 33.3 g/dL (ref 30.0–36.0)
MCV: 102.1 fL — ABNORMAL HIGH (ref 80.0–100.0)
Monocytes Absolute: 0.4 10*3/uL (ref 0.1–1.0)
Monocytes Relative: 8 %
Neutro Abs: 3.4 10*3/uL (ref 1.7–7.7)
Neutrophils Relative %: 71 %
Platelet Count: 55 10*3/uL — ABNORMAL LOW (ref 150–400)
RBC: 3.38 MIL/uL — ABNORMAL LOW (ref 4.22–5.81)
RDW: 15 % (ref 11.5–15.5)
WBC Count: 4.8 10*3/uL (ref 4.0–10.5)
nRBC: 0 % (ref 0.0–0.2)

## 2020-10-12 NOTE — Progress Notes (Signed)
HEMATOLOGY/ONCOLOGY CLINIC NOTE  Date of Service: 10/12/2020  Patient Care Team: [name], MD as PCP - General (Family Medicine) Satira Sark, MD as PCP - Cardiology (Cardiology) Marilynne Halsted, MD as Referring Physician (Ophthalmology) Brunetta Genera, MD as Consulting Physician (Hematology) Pyrtle, Lajuan Lines, MD as Consulting Physician (Gastroenterology) Marzetta Board, DPM as Consulting Physician (Podiatry) Jonnie Finner, RN as Oncology Nurse Navigator Madelin Rear, [facility] as Pharmacist (Pharmacist)  CHIEF COMPLAINTS/PURPOSE OF CONSULTATION:  Thrombocytopenia  HISTORY OF PRESENTING ILLNESS:   [name] is a wonderful 80 y.o. 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 He is accompanied today by his wife. The pt reports that he is doing well overall.   The pt reports that he has had low energy levels from the past 3-4 years since he retired. Today he feels fatigued as well.  He has been taking Metformin for the last 10 years and has recently begun replacing Vitamin B12. He has neuropathy in his feet and has experienced some falls and uses a cane at night. He is also taking Finasteride  Most recent lab results (02/28/18) of CBC  is as follows: all values are WNL except for RBC at 3.42, HGB at 11.4, HCT at 33.5, Platelets at 127k. Testosterone 02/13/18 was lower at 185  On review of systems, pt reports neuropathy in his feet, some fatigue, stable back pain, some ankle swelling, and denies abdominal pains and any other symptoms.   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 We are joined today by his wife. The patient's last visit  with [place] was on 08/10/2020. The pt reports that he is doing well overall.  The pt reports that he tolerated radiation well and denies any abdominal symptoms or skin changes. His CHF has been stable in the interim.  Lab results today (10/12/20) of CBC w/diff and CMP is as follows: all values are WNL except for RBC at 3.38, Hgb at 11.5, HCT at 34.5,  MCV at 102.1, PLT at 55K, Glucose at 163, Anion gap at 4. 10/12/2020 AFP is in progress  On review of systems, pt denies nausea, skin changes, abdominal pain, diarrhea, constipation, abnormal/excessive bleeding, leg swelling and any other symptoms.   MEDICAL HISTORY:  Past Medical History:  Diagnosis Date  . Benign prostatic hypertrophy   . CHF (congestive heart failure) ([place])    a. EF 20-25% by echo in 11/2019  . COPD (chronic obstructive pulmonary disease) ([place]) December 15, 2009   FEV1 2.30 (70%) ratio 63 no better with B2 and DLCO 18/6 (73%) corrects to 106%  . Degenerative joint disease   . Depression   . Diverticulosis   . Essential hypertension    ACE inhibitor cough  . Fatty liver   . Hyperlipidemia   . Internal hemorrhoids   . Low back pain   . Otitis externa 07/30/2012  . Splenomegaly   . Type 2 diabetes mellitus ([place])   . Ulcer of foot ([place])    Right foot    SURGICAL HISTORY: Past Surgical History:  Procedure Laterality Date  . IR EMBO TUMOR ORGAN ISCHEMIA INFARCT INC GUIDE ROADMAPPING  06/01/2020  .  IR RADIOLOGIST EVAL & MGMT  04/28/2020  . IR RADIOLOGIST EVAL & MGMT  07/15/2020  . NO PAST SURGERIES  1980    SOCIAL HISTORY: Social History   Socioeconomic History  . Marital status: Married    Spouse name: Not on file  . Number of children: Not on file  . Years of education: Not on file  . Highest education level: Not on file  Occupational History  . Occupation: retired    Comment: maintenence work  Tobacco Use  . Smoking status: Former Smoker    Packs/day: 4.00    Years: 20.00    Pack years: 80.00    Types:  Cigarettes    Quit date: 09/18/1984    Years since quitting: 36.0  . Smokeless tobacco: Never Used  . Tobacco comment: quit in 1980  Vaping Use  . Vaping Use: Never used  Substance and Sexual Activity  . 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 Drug use: No  . Sexual activity: Yes  Other Topics Concern  . Not on file  Social History Narrative   Married 53 years in 2015. 4 kids (2 sons) and 3 grandkids.    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 Difficulty of Paying Living Expenses: Not hard at all  Food Insecurity: No Food Insecurity  . Worried About Charity fundraiser in the Last Year: Never true  . Ran Out of Food in the Last Year: Never true  Transportation Needs: No Transportation Needs  . Lack of Transportation (Medical): No  . Lack of Transportation (Non-Medical): No  Physical Activity: Inactive  . Days of Exercise per Week: 0 days  . Minutes of Exercise per Session: 0 min  Stress: No Stress Concern Present  . Feeling of Stress : Not at all  Social Connections: Moderately Integrated  . Frequency of Communication with Friends and Family: More than three times a week  . Frequency of Social Gatherings with Friends and Family: Twice a week  . Attends Religious Services: More than 4 times per year  . Active Member of Clubs or Organizations: No  . Attends Archivist Meetings: Never  . Marital Status: Married  Human resources officer Violence: Not At Risk  . Fear of Current or Ex-Partner: No  . Emotionally Abused: No  . Physically Abused: No  . Sexually Abused: No    FAMILY HISTORY: Family History  Problem Relation Age of Onset  . Melanoma Mother   . Stroke Father   . CAD Brother        Premature disease    ALLERGIES:  has No Known Allergies.  MEDICATIONS:   Current Outpatient Medications  Medication Sig Dispense Refill  . acyclovir (ZOVIRAX) 400 MG tablet TAKE 1 TABLET TWICE DAILY 180 tablet 1  . albuterol (VENTOLIN HFA) 108 (90 Base) MCG/ACT inhaler Inhale 2 puffs into the lungs every 6 (six) hours as needed for wheezing or shortness of breath. 6.7 g 3  . amitriptyline (ELAVIL) 50 MG tablet TAKE 1 TABLET AT BEDTIME 90 tablet 1  . ASCORBIC ACID PO Take 1 tablet by mouth daily.     Marland Kitchen aspirin EC 81 MG tablet Take 81 mg  by mouth daily.    . Blood Glucose Monitoring Suppl (TRUE METRIX AIR GLUCOSE METER) w/Device KIT     . busPIRone (BUSPAR) 5 MG tablet Take 1 tablet (5 mg total) by mouth 2 (two) times daily as needed (anxiety). 60 tablet 5  . carvedilol (COREG) 6.25 MG tablet Take 1 tablet (6.25 mg total) by mouth 2 (two) times daily. 180 tablet 3  . Cholecalciferol 1000 UNITS capsule Take 1 capsule (1,000 Units total) by mouth 2 (two) times daily.    . clotrimazole (LOTRIMIN) 1 % cream For athlete's feet, apply to both feet and between toes twice daily for 4 weeks. 45 g 1  . clotrimazole-betamethasone (LOTRISONE) cream Apply 1 application topically 2 (two) times daily. For 7 days maximum for severe jock itch . Stop if worsening symptoms. 45 g 1  . escitalopram (LEXAPRO) 5 MG tablet Take 1 tablet (5 mg total) by mouth daily. 90 tablet 5  . famotidine (PEPCID) 20 MG tablet TAKE 1 TABLET TWICE DAILY 180 tablet 1  . finasteride (PROSCAR) 5 MG tablet TAKE 1 TABLET EVERY DAY 90 tablet 1  . furosemide (LASIX) 20 MG tablet TAKE 1 TABLET (20 MG TOTAL) BY MOUTH DAILY. 90 tablet 1  . gabapentin (NEURONTIN) 300 MG capsule Take 1 capsule (300 mg total) by mouth 2 (two) times daily. 180 capsule 3  . glucose blood (TRUE METRIX BLOOD GLUCOSE TEST) test strip TEST BLOOD SUGAR EVERY DAY 100 strip 1  . Multiple Vitamin (MULTIVITAMIN) capsule Take 1 capsule by mouth daily.      . mupirocin ointment (BACTROBAN) 2 % Apply to right foot ulcer once daily and cover with  dressing. 30 g 1  . NONFORMULARY OR COMPOUNDED ITEM [facility]  Peripheral Neuropathy Cream- Bupivacaine 1%, Doxepin 3%, Gabapentin 6%, Pentoxifylline 3%, Topiramate 1% Apply 1-2 grams to affected area 3-4 times daily Qty. 120 gm 3 refills    . omeprazole (PRILOSEC) 20 MG capsule TAKE 1 CAPSULE DAILY 30 TO 60 MINUTES BEFORE FIRST MEAL OF THE DAY 90 capsule 1  . rosuvastatin (CRESTOR) 10 MG tablet Take 1 tablet (10 mg total) by mouth daily. 90 tablet 3  . sacubitril-valsartan (ENTRESTO) 49-51 MG Take 1 tablet by mouth 2 (two) times daily. 180 tablet 3  . sodium chloride (MURO 128) 5 % ophthalmic solution Place 1 drop into the left eye at bedtime.     . tamsulosin (FLOMAX) 0.4 MG CAPS capsule TAKE 1 CAPSULE EVERY DAY 90 capsule 1  . TRUEPLUS LANCETS 28G MISC Use to test blood sugars two times daily. Dx: E11.9 200 each 3   No current facility-administered medications for this visit.    REVIEW OF SYSTEMS:   A 10+ POINT REVIEW OF SYSTEMS WAS OBTAINED including neurology, dermatology, psychiatry, cardiac, respiratory, lymph, extremities, GI, GU, Musculoskeletal, constitutional, breasts, reproductive, HEENT.  All pertinent positives are noted in the HPI.  All others are negative.   PHYSICAL EXAMINATION:  . Vitals:   10/12/20 1334  BP: (!) 157/75  Pulse: 84  Resp: 18  Temp: 98.2 F (36.8 C)  SpO2: 96%   Filed Weights   10/12/20 1334  Weight: 210 lb 1.6 oz (95.3 kg)   .Body mass index is 28.49 kg/m.  Exam was given was given in a chair   GENERAL:alert, in no acute distress and comfortable SKIN: no acute rashes, no significant lesions EYES: conjunctiva are pink and non-injected, sclera anicteric OROPHARYNX: MMM, no exudates, no oropharyngeal erythema or ulceration NECK: supple, no JVD LYMPH:  no palpable lymphadenopathy in the cervical, axillary or inguinal regions LUNGS: clear to auscultation b/l with normal respiratory effort HEART: regular rate & rhythm ABDOMEN:   normoactive bowel sounds , non tender, not distended. No palpable hepatosplenomegaly.  Extremity: no pedal edema PSYCH: alert & oriented x 3 with fluent speech NEURO: no focal motor/sensory deficits  LABORATORY DATA:  I have reviewed the data as listed  . CBC Latest Ref Rng & Units 10/12/2020 08/10/2020 08/06/2020  WBC 4.0 - 10.5 K/uL 4.8 6.3 6.1  Hemoglobin 13.0 - 17.0 g/dL 11.5(L) 11.4(L) 11.8(L)  Hematocrit 39.0 - 52.0 % 34.5(L) 33.7(L) 34.9(L)  Platelets 150 - 400 K/uL 55(L) 88(L) 98(L)   . CBC    Component Value Date/Time   WBC 4.8 10/12/2020 1300   WBC 6.3 08/10/2020 1343   RBC 3.38 (L) 10/12/2020 1300   HGB 11.5 (L) 10/12/2020 1300   HCT 34.5 (L) 10/12/2020 1300   HCT 33.1 (L) 06/21/2018 1332   PLT 55 (L) 10/12/2020 1300   MCV 102.1 (H) 10/12/2020 1300   MCH 34.0 10/12/2020 1300   MCHC 33.3 10/12/2020 1300   RDW 15.0 10/12/2020 1300   LYMPHSABS 0.9 10/12/2020 1300   MONOABS 0.4 10/12/2020 1300   EOSABS 0.1 10/12/2020 1300   BASOSABS 0.0 10/12/2020 1300    . CMP Latest Ref Rng & Units 10/12/2020 08/12/2020 08/10/2020  Glucose 70 - 99 mg/dL 163(H) 169(H) 227(H)  BUN 8 - 23 mg/dL '17 19 20  ' Creatinine 0.61 - 1.24 mg/dL 1.21 1.33(H) 1.31(H)  Sodium 135 - 145 mmol/L 141 139 140  Potassium 3.5 - 5.1 mmol/L 4.6 4.5 4.7  Chloride 98 - 111 mmol/L 108 103 105  CO2 22 - 32 mmol/L '29 28 29  ' Calcium 8.9 - 10.3 mg/dL 9.4 9.5 9.3  Total Protein 6.5 - 8.1 g/dL 6.9 - 6.9  Total Bilirubin 0.3 - 1.2 mg/dL 0.7 - 0.7  Alkaline Phos 38 - 126 U/L 100 - 100  AST 15 - 41 U/L 25 - 23  ALT 0 - 44 U/L 15 - 17   Component     Latest Ref Rng & Units 06/21/2018  Folate, Hemolysate     Not Estab. ng/mL >620.0  HCT     37.5 - 51.0 % 33.1 (L)  Folate, RBC     >498 ng/mL >1,873  Methylmalonic Acid, Quantitative     0 - 378 nmol/L 294  DISCLAIMER      Comment  Specimen Type      [place]  Lead-Whole Blood     0 - 4 ug/dL 2  Vitamin B12     180 - 914 pg/mL 836  Homocysteine     0.0 -  15.0 umol/L 11.2    02/28/18 Surgical Pathology:   02/28/18 Cytogenetics:    RADIOGRAPHIC STUDIES: I have personally reviewed the radiological images as listed and agreed with the findings in the report. No results found.  ASSESSMENT & PLAN:   80 y.o. male with  1. Thrombocytopenia  Mild Plt 127k with minimal normocytic anemia. 02/28/18 surgical pathology report with no overt pathology, could not rule out MDS, and noted an increase in ring sideroblasts 02/28/18 BM cytogenetics report was unremarkable  2. 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 2. 4 cm hypervascular mass in segment 4A of left hepatic lobe, and 8 mm hypervascular  nodule in posterior right hepatic lobe. These are highly suspicious for hepatocellular carcinomas in the setting of cirrhosis. 3. No evidence of metastatic disease." -03/27/2020 MRI Abdomen ([id number]) revealed "1. The dominant lesion of concern in the medial segment of the left hepatic lobe (segment 4 A) is diagnostic of hepatocellular carcinoma (LR 5). 2. The other small lesion questioned inferiorly in the right lobe is not clearly seen, although evaluation of this area is mildly limited by motion artifact." PLAN: -Discussed pt labwork today, 10/12/20; WBC is nml, Hgb is stable, PLT have dropped, blood chemistries are steady, AFP is in progress. -Discussed 08/10/2020 AFP tumor marker is WNL. -Advised pt that because his AFP has remained WNL it will not be the most accurate for defining treatment response. -No lab or clinical evidence of Hepatocellular Carcinoma progression at this time. -Will repeat MRI Abd in 7 weeks with labs -Will see back in 8 weeks   FOLLOW UP: MRI abd w and w/o contrast in 7 weeks Labs in 7 weeks RTC with Dr Irene Limbo in 8 weeks    The  total time spent in the appt was 20 minutes and more than 50% was on counseling and direct patient cares.  All of the patient's questions were answered with apparent satisfaction. The patient knows to call the clinic with any problems, questions or concerns.   (Office):       [phone] (Work cell):  [phone] (Fax):           [phone]  10/12/2020 2:18 PM  I, Yevette Edwards, am acting as a scribe for Dr. Sullivan Lone.   .I have reviewed the above documentation for accuracy and completeness, and I agree with the above. Brunetta Genera MD

## 2020-10-13 LAB — AFP TUMOR MARKER: AFP, Serum, Tumor Marker: 6.3 ng/mL (ref 0.0–8.3)

## 2020-10-25 DIAGNOSIS — J9611 Chronic respiratory failure with hypoxia: Secondary | ICD-10-CM | POA: Diagnosis not present

## 2020-10-25 DIAGNOSIS — J449 Chronic obstructive pulmonary disease, unspecified: Secondary | ICD-10-CM | POA: Diagnosis not present

## 2020-10-28 ENCOUNTER — Other Ambulatory Visit: Payer: Self-pay | Admitting: Family Medicine

## 2020-11-04 ENCOUNTER — Encounter: Payer: Self-pay | Admitting: Podiatry

## 2020-11-04 NOTE — Progress Notes (Signed)
  Radiation Oncology         [id number]) [phone] ________________________________  Name: [name] MRN: [id number]  Date: 08/28/2020  DOB: 04-Jun-1941  End of Treatment Note  Diagnosis:   stage II hepatocellular carcinoma     Indication for treatment::  curative       Radiation treatment dates:   08/18/20 - 08/28/20  Site/dose:   The patient was treated to the liver with a course of stereotactic body radiation treatment.  The patient received 60 Gray in 5 fractions using a IMRT/SBRT technique, with 3 fields.  Narrative: The patient tolerated radiation treatment relatively well.   No unexpected difficulties.  The patient's breathing did not significantly change during the course of the treatment.  Plan: The patient has completed radiation treatment. The patient will return to radiation oncology clinic for routine followup in one month. I advised the patient to call or return sooner if they have any questions or concerns related to their recovery or treatment. ________________________________  Jodelle Gross, M.D., Ph.D.

## 2020-11-11 ENCOUNTER — Ambulatory Visit (INDEPENDENT_AMBULATORY_CARE_PROVIDER_SITE_OTHER): Payer: Medicare HMO

## 2020-11-11 DIAGNOSIS — E1142 Type 2 diabetes mellitus with diabetic polyneuropathy: Secondary | ICD-10-CM | POA: Diagnosis not present

## 2020-11-11 DIAGNOSIS — I5022 Chronic systolic (congestive) heart failure: Secondary | ICD-10-CM | POA: Diagnosis not present

## 2020-11-11 NOTE — Patient Instructions (Signed)
[name],  Thank you for taking the time to review your medications with me today.  I have included our care plan/goals in the following pages. Please review and call me at [phone] with any questions!  Thanks! Ellin Mayhew, Pharm.D., BCGP Clinical Pharmacist [facility] [phone]  Goals Addressed            This Visit's Progress   . PharmD Care Plan       CARE PLAN ENTRY (see longitudinal plan of care for additional care plan information)  Current Barriers:  . Chronic Disease Management support, education, and care coordination needs related to Hypertension, Hyperlipidemia, Diabetes, and Depression   Heart Failure / High Blood Pressure BP Readings from Last 3 Encounters:  10/12/20 (!) 157/75  08/12/20 (!) 142/61  08/12/20 (!) 147/76   . Pharmacist Clinical Goal(s): o Over the next 180 days, patient will work with PharmD and providers to - Help maintain BP goal <140/90 - Ensure medication affordability and accessibility . Current regimen:  . Entresto 49-51 mg twice daily  . Carvedilol 6.25 mg twice daily  . Interventions: o Home BP recommendations o Reviewed daily weights o Diet recommendations . Patient self care activities - Over the next 180 days, patient will: o Check BP at least 1-2x/week, document, and provide at future appointments o We discussed weighing daily; if you gain more than 3 pounds in one day or 5 pounds in one week call your doctor. o Ensure daily salt intake < 2300 mg/day  Hyperlipidemia Lab Results  Component Value Date/Time   LDLCALC 49 11/07/2019 11:46 AM   LDLDIRECT 57.0 10/22/2018 02:51 PM   . Pharmacist Clinical Goal(s): o Over the next 180 days, patient will work with PharmD and providers to maintain LDL goal < 70 . Current regimen:  o Crestor 10 mg once daily . Interventions: o Diet recommendations - see high blood pressure / heart failure. . Patient self care activities - Over the  next 180 days, patient will: o See high blood pressure / heart failure.  Diabetes Lab Results  Component Value Date/Time   HGBA1C 5.9 (A) 02/25/2020 01:37 PM   HGBA1C 5.9 (A) 11/07/2019 11:09 AM   HGBA1C 7.1 (H) 10/22/2018 02:51 PM   HGBA1C 7.7 (H) 09/28/2017 02:14 PM   . Pharmacist Clinical Goal(s): o Over the next 180 days, patient will work with PharmD and providers to maintain A1c goal <6.5% . Current regimen:  o N/a . Interventions: o Diet recommendations o Discussed follow-up with PCP and resuming metformin as appropriate . Patient self care activities - Over the next 180 days, patient will: o Check blood sugar as directed, document, and provide at future appointments o Continue to minimize intake of moods high in sugar / carbohydrates  Depression/Anxiety . Pharmacist Clinical Goal(s) o Over the next 180 days, patient will work with PharmD and providers to minimize symptoms of depression, ensure medication safety . Current regimen:  o Escitalopram 5 mg once daily o Buspar 5 mg twice daily as needed for anxiety . Interventions: o Reviewed side effects . Patient self care activities - Over the next 180 days, patient will: o Continue current management  Medication management . Pharmacist Clinical Goal(s): o Over the next 180 days, patient will work with PharmD and providers to maintain optimal medication adherence . Current pharmacy: [facility] . Interventions o Comprehensive medication review performed. o Continue current medication management strategy . Patient self  care activities - Over the next 180 days, patient will: o Take medications as prescribed o Report any questions or concerns to PharmD and/or provider(s) Initial goal documentation.      The patient verbalized understanding of instructions provided today and agreed to receive a MyChart copy of patient instruction and/or educational materials. Telephone follow up appointment with pharmacy  team member scheduled for: See next appointment with "Care Management Staff" under "What's Next" below.

## 2020-11-11 NOTE — Progress Notes (Signed)
Chronic Care Management Pharmacy   Name: [name]  MRN: [id number] DOB: July 02, 1941  Chief Complaint/ HPI  [name],  80 y.o., male presents for their Follow-Up CCM visit with the clinical pharmacist via telephone due to COVID-19 Pandemic.  PCP : [name], MD  Chronic conditions include:   Encounter Diagnoses  Name Primary?  . Controlled type 2 diabetes mellitus with diabetic polyneuropathy, without long-term current use of insulin ([place]) Yes  . Chronic systolic heart failure [facility])    Office Visits:  10/12/2020 (Dr Irene Limbo): The pt reports that he tolerated radiation well and denies any abdominal symptoms or skin changes. His CHF has been stable in the interim. 02/25/2020 (PCP): Diarrhea following most meals, stop metformin for a few weeks, immodium OTC; buspirone 5 mg twice daily for anxiety.  Patient Active Problem List   Diagnosis Date Noted  . 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 Anxiety 02/25/2020  . 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[place]) 12/14/2019  . Aortic atherosclerosis ([place]) 12/14/2019  . Chronic systolic heart failure ([place]) 12/05/2019  . Diabetic ulcer of right midfoot associated with diabetes mellitus due to underlying condition, with fat layer exposed ([place]) 11/07/2019  . Anemia 03/05/2018  . GERD (gastroesophageal reflux disease) 09/30/2017  . 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 positive 03/29/2016  . Thrombocytopenia ([place]) 12/21/2015  . B12 deficiency 08/20/2015  . 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 Diabetic polyneuropathy associated with type 2 diabetes mellitus ([place]) 08/07/2015  . Diplopia 06/05/2015  . CKD (chronic kidney disease), stage III ([place]) 05/06/2015  . Fatty liver 11/04/2014  . 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 PCO (posterior capsular opacification) 06/19/2013  . 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 Pseudophakia of left eye 04/10/2013  . ILD (interstitial lung disease) ([place]) 07/05/2012  . Nuclear cataract  01/20/2012  . 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 COPD (chronic obstructive pulmonary disease) ([place]) 12/15/2009  . BPH (benign prostatic hyperplasia) 06/20/2007  . Depression 05/02/2007  . Chronic back pain. Off narcotics 06/05/17 due to negative UDS for opiates x2. Full history 06/12/14. Pain contract signed.  05/02/2007  . DM (diabetes mellitus) type II controlled, neurological manifestation ([place]) 04/06/2007  . Hyperlipidemia associated with type 2 diabetes mellitus ([place]) 04/06/2007  . Essential hypertension 04/06/2007  . Osteoarthritis 04/06/2007   Past Surgical History:  Procedure Laterality Date  . IR EMBO TUMOR ORGAN ISCHEMIA INFARCT INC GUIDE ROADMAPPING  06/01/2020  . IR RADIOLOGIST EVAL & MGMT  04/28/2020  . IR RADIOLOGIST EVAL & MGMT  07/15/2020  . NO PAST SURGERIES  1980   Family History  Problem Relation Age of Onset  . Melanoma Mother   . Stroke Father   . CAD Brother        Premature disease   Not on File Outpatient Encounter Medications as of 11/11/2020  Medication Sig Note  . acyclovir (ZOVIRAX) 400 MG tablet TAKE 1 TABLET TWICE DAILY   . albuterol (VENTOLIN HFA) 108 (90 Base) MCG/ACT inhaler Inhale 2 puffs into the lungs every 6 (six) hours as needed for wheezing or shortness of breath.   Marland Kitchen amitriptyline (ELAVIL) 50 MG tablet TAKE 1 TABLET AT BEDTIME   . ASCORBIC ACID PO Take 1 tablet by mouth daily.    Marland Kitchen aspirin EC 81 MG tablet Take 81 mg by mouth daily.   . Blood Glucose Monitoring Suppl (TRUE METRIX AIR GLUCOSE METER) w/Device KIT    . busPIRone (BUSPAR) 5 MG tablet Take 1 tablet (5 mg total) by mouth  2 (two) times daily as needed (anxiety).   . carvedilol (COREG) 6.25 MG tablet Take 1 tablet (6.25 mg total) by mouth 2 (two) times daily.   . Cholecalciferol 1000 UNITS capsule Take 1 capsule (1,000 Units total) by mouth 2 (two) times daily.   . clotrimazole (LOTRIMIN) 1 % cream For athlete's feet, apply to both feet and between toes twice daily for 4 weeks.   .  clotrimazole-betamethasone (LOTRISONE) cream Apply 1 application topically 2 (two) times daily. For 7 days maximum for severe jock itch . Stop if worsening symptoms. 12/03/2019: Unknown start date   . escitalopram (LEXAPRO) 5 MG tablet Take 1 tablet (5 mg total) by mouth daily.   . famotidine (PEPCID) 20 MG tablet TAKE 1 TABLET TWICE DAILY   . finasteride (PROSCAR) 5 MG tablet TAKE 1 TABLET EVERY DAY   . furosemide (LASIX) 20 MG tablet TAKE 1 TABLET (20 MG TOTAL) BY MOUTH DAILY.   Marland Kitchen gabapentin (NEURONTIN) 300 MG capsule Take 1 capsule (300 mg total) by mouth 2 (two) times daily.   Marland Kitchen glucose blood (TRUE METRIX BLOOD GLUCOSE TEST) test strip TEST BLOOD SUGAR EVERY DAY   . Multiple Vitamin (MULTIVITAMIN) capsule Take 1 capsule by mouth daily.     . mupirocin ointment (BACTROBAN) 2 % Apply to right foot ulcer once daily and cover with dressing.   . NONFORMULARY OR COMPOUNDED ITEM [facility]  Peripheral Neuropathy Cream- Bupivacaine 1%, Doxepin 3%, Gabapentin 6%, Pentoxifylline 3%, Topiramate 1% Apply 1-2 grams to affected area 3-4 times daily Qty. 120 gm 3 refills 08/03/2020: As needed  . omeprazole (PRILOSEC) 20 MG capsule TAKE 1 CAPSULE DAILY 30 TO 60 MINUTES BEFORE FIRST MEAL OF THE DAY   . rosuvastatin (CRESTOR) 10 MG tablet Take 1 tablet (10 mg total) by mouth daily. Patient needs an appointment for further refills.   . sacubitril-valsartan (ENTRESTO) 49-51 MG Take 1 tablet by mouth 2 (two) times daily.   . sodium chloride (MURO 128) 5 % ophthalmic solution Place 1 drop into the left eye at bedtime.    . tamsulosin (FLOMAX) 0.4 MG CAPS capsule TAKE 1 CAPSULE EVERY DAY   . TRUEPLUS LANCETS 28G MISC Use to test blood sugars two times daily. Dx: E11.9    No facility-administered encounter medications on file as of 11/11/2020.   Patient Care Team    Relationship Specialty Notifications Start End  [name], MD PCP - General Family Medicine  06/12/14    Comment: Wanda Plump,  Aloha Gell, MD PCP - Cardiology Cardiology  12/26/19   Marilynne Halsted, MD Referring Physician Ophthalmology  07/03/19   Brunetta Genera, MD Consulting Physician Hematology  07/03/19   Pyrtle, Lajuan Lines, MD Consulting Physician Gastroenterology  07/03/19   Marzetta Board, DPM Consulting Physician Podiatry  07/03/19   Jonnie Finner, RN Oncology Nurse Navigator  All results, Admissions 04/08/20    Comment: Phone:  [phone]   Fax:  [phone]  Madelin Rear, [facility] Pharmacist Pharmacist  05/06/20    Comment: [phone]   Current Diagnosis/Assessment: Goals Addressed            This Visit's Progress   . PharmD Care Plan       CARE PLAN ENTRY (see longitudinal plan of care for additional care plan information)  Current Barriers:  . Chronic Disease Management support, education, and care coordination needs related to Hypertension, Hyperlipidemia, Diabetes, and Depression   Heart Failure / High Blood Pressure BP Readings from Last 3  Encounters:  10/12/20 (!) 157/75  08/12/20 (!) 142/61  08/12/20 (!) 147/76   . Pharmacist Clinical Goal(s): o Over the next 180 days, patient will work with PharmD and providers to - Help maintain BP goal <140/90 - Ensure medication affordability and accessibility . Current regimen:  . Entresto 49-51 mg twice daily  . Carvedilol 6.25 mg twice daily  . Interventions: o Home BP recommendations o Reviewed daily weights o Diet recommendations . Patient self care activities - Over the next 180 days, patient will: o Check BP at least 1-2x/week, document, and provide at future appointments o We discussed weighing daily; if you gain more than 3 pounds in one day or 5 pounds in one week call your doctor. o Ensure daily salt intake < 2300 mg/day  Hyperlipidemia Lab Results  Component Value Date/Time   LDLCALC 49 11/07/2019 11:46 AM   LDLDIRECT 57.0 10/22/2018 02:51 PM   . Pharmacist Clinical Goal(s): o Over the next 180 days, patient will  work with PharmD and providers to maintain LDL goal < 70 . Current regimen:  o Crestor 10 mg once daily . Interventions: o Diet recommendations - see high blood pressure / heart failure. . Patient self care activities - Over the next 180 days, patient will: o See high blood pressure / heart failure.  Diabetes Lab Results  Component Value Date/Time   HGBA1C 5.9 (A) 02/25/2020 01:37 PM   HGBA1C 5.9 (A) 11/07/2019 11:09 AM   HGBA1C 7.1 (H) 10/22/2018 02:51 PM   HGBA1C 7.7 (H) 09/28/2017 02:14 PM   . Pharmacist Clinical Goal(s): o Over the next 180 days, patient will work with PharmD and providers to maintain A1c goal <6.5% . Current regimen:  o N/a . Interventions: o Diet recommendations o Discussed follow-up with PCP and resuming metformin as appropriate . Patient self care activities - Over the next 180 days, patient will: o Check blood sugar as directed, document, and provide at future appointments o Continue to minimize intake of moods high in sugar / carbohydrates  Depression/Anxiety . Pharmacist Clinical Goal(s) o Over the next 180 days, patient will work with PharmD and providers to minimize symptoms of depression, ensure medication safety . Current regimen:  o Escitalopram 5 mg once daily o Buspar 5 mg twice daily as needed for anxiety . Interventions: o Reviewed side effects . Patient self care activities - Over the next 180 days, patient will: o Continue current management  Medication management . Pharmacist Clinical Goal(s): o Over the next 180 days, patient will work with PharmD and providers to maintain optimal medication adherence . Current pharmacy: [facility] . Interventions o Comprehensive medication review performed. o Continue current medication management strategy . Patient self care activities - Over the next 180 days, patient will: o Take medications as prescribed o Report any questions or concerns to PharmD and/or provider(s) Initial  goal documentation.      Diabetes   A1c goal < 6.5% Lab Results  Component Value Date/Time   HGBA1C 5.9 (A) 02/25/2020 01:37 PM   HGBA1C 5.9 (A) 11/07/2019 11:09 AM   HGBA1C 7.1 (H) 10/22/2018 02:51 PM   HGBA1C 7.7 (H) 09/28/2017 02:14 PM   MICROALBUR 60.3 (H) 01/29/2018 12:52 PM   MICROALBUR 7.8 (H) 12/01/2015 09:40 AM      Component Value Date/Time   PROT 6.9 10/12/2020 1300   ALBUMIN 3.7 10/12/2020 1300   AST 25 10/12/2020 1300   ALT 15 10/12/2020 1300   ALKPHOS 100 10/12/2020 1300  BILITOT 0.7 10/12/2020 1300   BILIDIR 0.2 12/04/2019 0312   IBILI 0.8 12/04/2019 2549   Lab Results  Component Value Date   IYMEBRAX09 407 11/07/2019  Previous medications: metformin ([place]), glyburide.   Checking BG: ~2x/week. Recent FBG readings ~200. Metformin stopped 02/2020 due to c/o of diarrhea. Does not feel diarrhea was impacted by stopping metformin however with hepatocellular carcinoma diagnosis and multiple appointments had not followed up on this or resumed therapy. Plans to schedule f/u with PCP today.  Patient is currently at goal on the following medications:  . N/a - no medications at this time. Unable to assess due to metformin being stopped at last visit.  We discussed diet/exercise - Maintain a healthy weight and exercise regularly, as directed by your health care provider. Eat healthy foods, such as: Lean proteins, complex carbohydrates, fresh fruits and vegetables, low-fat dairy products, healthy fats.  Plan  Continue current medications and control with diet and exercise.  Heart Failure   Type: Systolic. Last ejection fraction: 20-25% 11/2019. Improved to 50-55% 06/2020. Previous medications lisinopril, losartan, olmesartan.  Patient is currently on the following medications:  . Entresto 49-51 mg twice daily  . Carvedilol 6.25 mg twice daily  . Furosemide 20 mg once daily   We discussed weighing daily; if you gain more than 3 pounds in one day or 5 pounds in one  week call your doctor. Reviewed patient assistance application process - will pursue for Entresto, pt to drop off with cardiology.  Plan  Continue current medications. CPA to prefill entresto PAP  Medication Management / Care Coordination   Receives prescription medications from:  [facility], [address] [place] [address] Phone: [phone] Fax: [phone]  Denies any issues with current medication management.   Plan  Continue current medication management strategy. ___________________________ SDOH (Social Determinants of Health) assessments performed: Yes.  Future Appointments  Date Time Provider [place]  12/02/2020 12:00 PM [facility] None  12/02/2020  1:00 PM [facility] 1 [facility] [place]  12/08/2020  2:00 PM Marzetta Board, DPM [facility]  12/09/2020  2:40 PM Brunetta Genera, MD [facility] None  12/14/2020  1:40 PM Satira Sark, MD [facility] [place] [facility]  12/15/2020  2:20 PM [name], MD [facility]  03/15/2021  1:30 PM [facility] CCM PHARMACIST [facility]  08/09/2021  1:00 PM [facility] HEALTH COACH [facility]   Visit follow-up:  . CPA follow-up: PAP application for Entresto. Marland Kitchen [place] follow-up: 3-4 month telephone visit  Madelin Rear, Pharm.D., BCGP Clinical Pharmacist  Primary Care [phone]

## 2020-11-12 ENCOUNTER — Telehealth: Payer: Self-pay

## 2020-11-12 NOTE — Chronic Care Management (AMB) (Signed)
° ° °  Chronic Care Management Pharmacy Assistant   Name: MARVELOUS BOUWENS  MRN: [id number] DOB: [birth year]  Reason for Encounter: Patient Assistance Coordination  Patient assistance application completed through [place] for medication Entresto. Application will be mailed to the patient for completion.  April D Calhoun, [place] Pharmacist Assistant [phone]

## 2020-11-23 ENCOUNTER — Telehealth: Payer: Self-pay

## 2020-11-25 ENCOUNTER — Other Ambulatory Visit: Payer: Self-pay | Admitting: Student

## 2020-11-25 DIAGNOSIS — J449 Chronic obstructive pulmonary disease, unspecified: Secondary | ICD-10-CM | POA: Diagnosis not present

## 2020-11-25 DIAGNOSIS — J9611 Chronic respiratory failure with hypoxia: Secondary | ICD-10-CM | POA: Diagnosis not present

## 2020-11-27 ENCOUNTER — Other Ambulatory Visit: Payer: Self-pay | Admitting: Family Medicine

## 2020-11-30 ENCOUNTER — Other Ambulatory Visit: Payer: Medicare HMO

## 2020-12-02 ENCOUNTER — Other Ambulatory Visit: Payer: Self-pay

## 2020-12-02 ENCOUNTER — Inpatient Hospital Stay: Payer: Medicare HMO | Attending: Hematology

## 2020-12-02 ENCOUNTER — Ambulatory Visit (HOSPITAL_COMMUNITY)
Admission: RE | Admit: 2020-12-02 | Discharge: 2020-12-02 | Disposition: A | Discharge status: Home / Self Care | Payer: Medicare HMO | Source: Ambulatory Visit | Attending: Hematology | Admitting: Hematology

## 2020-12-02 DIAGNOSIS — D696 Thrombocytopenia, unspecified: Secondary | ICD-10-CM | POA: Insufficient documentation

## 2020-12-02 DIAGNOSIS — K7689 Other specified diseases of liver: Secondary | ICD-10-CM | POA: Diagnosis not present

## 2020-12-02 DIAGNOSIS — R600 Localized edema: Secondary | ICD-10-CM | POA: Diagnosis not present

## 2020-12-02 DIAGNOSIS — C220 Liver cell carcinoma: Secondary | ICD-10-CM | POA: Insufficient documentation

## 2020-12-02 DIAGNOSIS — E538 Deficiency of other specified B group vitamins: Secondary | ICD-10-CM | POA: Insufficient documentation

## 2020-12-02 DIAGNOSIS — K449 Diaphragmatic hernia without obstruction or gangrene: Secondary | ICD-10-CM | POA: Diagnosis not present

## 2020-12-02 LAB — CMP (CANCER CENTER ONLY)
ALT: 14 U/L (ref 0–44)
AST: 28 U/L (ref 15–41)
Albumin: 3.5 g/dL (ref 3.5–5.0)
Alkaline Phosphatase: 115 U/L (ref 38–126)
Anion gap: 7 (ref 5–15)
BUN: 19 mg/dL (ref 8–23)
CO2: 26 mmol/L (ref 22–32)
Calcium: 8.9 mg/dL (ref 8.9–10.3)
Chloride: 105 mmol/L (ref 98–111)
Creatinine: 1.37 mg/dL — ABNORMAL HIGH (ref 0.61–1.24)
GFR, Estimated: 52 mL/min — ABNORMAL LOW (ref >60)
Glucose, Bld: 181 mg/dL — ABNORMAL HIGH (ref 70–99)
Potassium: 5 mmol/L (ref 3.5–5.1)
Sodium: 138 mmol/L (ref 135–145)
Total Bilirubin: 0.6 mg/dL (ref 0.3–1.2)
Total Protein: 6.4 g/dL — ABNORMAL LOW (ref 6.5–8.1)

## 2020-12-02 LAB — CBC WITH DIFFERENTIAL/PLATELET
Abs Immature Granulocytes: 0.01 10*3/uL (ref 0.00–0.07)
Basophils Absolute: 0 10*3/uL (ref 0.0–0.1)
Basophils Relative: 1 %
Eosinophils Absolute: 0.1 10*3/uL (ref 0.0–0.5)
Eosinophils Relative: 1 %
HCT: 32.5 % — ABNORMAL LOW (ref 39.0–52.0)
Hemoglobin: 10.7 g/dL — ABNORMAL LOW (ref 13.0–17.0)
Immature Granulocytes: 0 %
Lymphocytes Relative: 23 %
Lymphs Abs: 0.8 10*3/uL (ref 0.7–4.0)
MCH: 33.9 pg (ref 26.0–34.0)
MCHC: 32.9 g/dL (ref 30.0–36.0)
MCV: 102.8 fL — ABNORMAL HIGH (ref 80.0–100.0)
Monocytes Absolute: 0.3 10*3/uL (ref 0.1–1.0)
Monocytes Relative: 8 %
Neutro Abs: 2.4 10*3/uL (ref 1.7–7.7)
Neutrophils Relative %: 67 %
Platelets: 68 10*3/uL — ABNORMAL LOW (ref 150–400)
RBC: 3.16 MIL/uL — ABNORMAL LOW (ref 4.22–5.81)
RDW: 14.8 % (ref 11.5–15.5)
WBC: 3.6 10*3/uL — ABNORMAL LOW (ref 4.0–10.5)
nRBC: 0 % (ref 0.0–0.2)

## 2020-12-02 LAB — IMMATURE PLATELET FRACTION: Immature Platelet Fraction: 7 % (ref 1.2–8.6)

## 2020-12-02 MED ORDER — GADOBUTROL 1 MMOL/ML IV SOLN
10.0000 mL | Freq: Once | INTRAVENOUS | Status: AC | PRN
  Administered 2020-12-02: 10 mL via INTRAVENOUS

## 2020-12-07 ENCOUNTER — Ambulatory Visit: Payer: Medicare HMO | Admitting: Hematology

## 2020-12-08 ENCOUNTER — Ambulatory Visit: Payer: Medicare HMO | Admitting: Podiatry

## 2020-12-08 ENCOUNTER — Encounter: Payer: Self-pay | Admitting: Podiatry

## 2020-12-08 ENCOUNTER — Other Ambulatory Visit: Payer: Self-pay

## 2020-12-08 DIAGNOSIS — M2041 Other hammer toe(s) (acquired), right foot: Secondary | ICD-10-CM

## 2020-12-08 DIAGNOSIS — E1142 Type 2 diabetes mellitus with diabetic polyneuropathy: Secondary | ICD-10-CM | POA: Diagnosis not present

## 2020-12-08 DIAGNOSIS — M79675 Pain in left toe(s): Secondary | ICD-10-CM

## 2020-12-08 DIAGNOSIS — L84 Corns and callosities: Secondary | ICD-10-CM

## 2020-12-08 DIAGNOSIS — B351 Tinea unguium: Secondary | ICD-10-CM | POA: Diagnosis not present

## 2020-12-08 DIAGNOSIS — M2042 Other hammer toe(s) (acquired), left foot: Secondary | ICD-10-CM

## 2020-12-08 DIAGNOSIS — M79674 Pain in right toe(s): Secondary | ICD-10-CM

## 2020-12-08 NOTE — Progress Notes (Signed)
HEMATOLOGY/ONCOLOGY CLINIC NOTE  Date of Service: 12/08/2020  Patient Care Team: Joshua Olp, MD as PCP - General (Family Medicine) Joshua Sark, MD as PCP - Cardiology (Cardiology) Joshua Halsted, MD as Referring Physician (Ophthalmology) Joshua Genera, MD as Consulting Physician (Hematology) Pyrtle, Lajuan Lines, MD as Consulting Physician (Gastroenterology) [name], DPM as Consulting Physician (Podiatry) Joshua Finner, RN as Oncology Nurse Navigator [name], [facility] as Pharmacist (Pharmacist)  CHIEF COMPLAINTS/PURPOSE OF CONSULTATION:  F/u for [name]  HISTORY OF PRESENTING ILLNESS:   [name] is a wonderful 80 y.o. 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 He is accompanied today by his wife. The pt reports that he is doing well overall.   The pt reports that he has had low energy levels from the past 3-4 years since he retired. Today he feels fatigued as well.  He has been taking Metformin for the last 10 years and has recently begun replacing Vitamin B12. He has neuropathy in his feet and has experienced some falls and uses a cane at night. He is also taking Finasteride  Most recent lab results (02/28/18) of CBC  is as follows: all values are WNL except for RBC at 3.42, HGB at 11.4, HCT at 33.5, Platelets at 127k. Testosterone 02/13/18 was lower at 185  On review of systems, pt reports neuropathy in his feet, some fatigue, stable back pain, some ankle swelling, and denies abdominal pains and any other symptoms.   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 We are joined today by his wife. The patient's last visit with  [place] was on 10/12/2020. The pt reports that he is doing well overall.  The pt reports no new symptoms or concerns. He notes no pain or issues related to the radiation therapy. The pt notes he has an appt with his Cardiologist on Thursday. He is still following up with his GI as instructed. The pt notes no medication changes. His breathing has been very steady and he has not needed oxygen very much.   Of note since the patient's last visit, pt has had MR Abd w wo contrast ([id number]) on 12/02/2020, which revealed "1. Interval decrease in size LEFT hepatic lobe hepatocellular carcinoma following stereotactic radiation treatment. Enhancement pattern of lesion remains same. New edema within the adjacent liver parenchyma. 2. Subtle lesion evident on T2 weighted imaging in the LEFT lateral hepatic lobe. Recommend attention on follow-up as this could represent a regenerating nodule"  Lab results 12/02/2020 of CBC w/diff and CMP is as follows: all values are WNL except for WBC of 3.6K, RBC of 3.16, Hgb of 10.7, HCT of 32.5, MCV of 102.8, Plt of 68K, Glucose of 181, Creatinine of 1.37, Total Protein of 6.4, GFR est of 52. 12/02/2020 Immature Plt Fract of 7.0.  On review of systems, pt reports anxiety, increased sleep and denies new bone pain, fevers, chills, fatigue, abdominal pain, back pain, leg swelling and any other symptoms.   MEDICAL HISTORY:  Past Medical History:  Diagnosis Date  . Benign prostatic hypertrophy   . CHF (congestive heart failure) (HCC)    a. EF 20-25% by echo in  11/2019  . COPD (chronic obstructive pulmonary disease) ([place]) December 15, 2009   FEV1 2.30 (70%) ratio 63 no better with B2 and DLCO 18/6 (73%) corrects to 106%  . Degenerative joint disease   . Depression   . Diverticulosis   . Essential hypertension    ACE inhibitor cough  . Fatty liver   . Hyperlipidemia   . Internal hemorrhoids   . Low back pain   . Otitis externa 07/30/2012  . Splenomegaly   . Type 2 diabetes  mellitus ([place])   . Ulcer of foot ([place])    Right foot    SURGICAL HISTORY: Past Surgical History:  Procedure Laterality Date  . IR EMBO TUMOR ORGAN ISCHEMIA INFARCT INC GUIDE ROADMAPPING  06/01/2020  . IR RADIOLOGIST EVAL & MGMT  04/28/2020  . IR RADIOLOGIST EVAL & MGMT  07/15/2020  . NO PAST SURGERIES  1980    SOCIAL HISTORY: Social History   Socioeconomic History  . Marital status: Unknown    Spouse name: Not on file  . Number of children: Not on file  . Years of education: Not on file  . Highest education level: Not on file  Occupational History  . Occupation: retired    Comment: maintenence work  Tobacco Use  . Smoking status: Former Smoker    Packs/day: 4.00    Years: 20.00    Pack years: 80.00    Types: Cigarettes    Quit date: 09/18/1984    Years since quitting: 36.2  . Smokeless tobacco: Never Used  . Tobacco comment: quit in 1980  Vaping Use  . Vaping Use: Never used  Substance and Sexual Activity  . 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 Drug use: No  . Sexual activity: Yes  Other Topics Concern  . Not on file  Social History Narrative   ** Merged History Encounter **       Married 53 years in 2015. 4 kids (2 sons) and 3 grandkids.    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 Difficulty of Paying Living Expenses: Not hard at all  Food Insecurity: No Food Insecurity  . Worried About Charity fundraiser in the Last Year: Never true  . Ran Out of Food in the Last Year: Never true  Transportation Needs: No Transportation Needs  . Lack of Transportation (Medical): No  . Lack of Transportation (Non-Medical): No  Physical Activity: Inactive  . Days of Exercise per Week: 0 days  . Minutes of Exercise per Session: 0 min  Stress: No Stress Concern Present  .  Feeling of Stress : Not at all  Social Connections: Moderately Integrated  . Frequency of Communication with Friends and Family: More than three times a week  . Frequency of Social Gatherings with Friends and Family: Twice a week  . Attends Religious Services: More than 4 times per year  . Active Member of Clubs or Organizations: No  . Attends Archivist Meetings: Never  . Marital Status: Married  Human resources officer Violence: Not At Risk  . Fear of Current or Ex-Partner: No  . Emotionally Abused: No  . Physically Abused: No  . Sexually Abused: No  FAMILY HISTORY: Family History  Problem Relation Age of Onset  . Melanoma Mother   . Stroke Father   . CAD Brother        Premature disease    ALLERGIES:  has no allergies on file.  MEDICATIONS:  Current Outpatient Medications  Medication Sig Dispense Refill  . acyclovir (ZOVIRAX) 400 MG tablet TAKE 1 TABLET TWICE DAILY 180 tablet 1  . albuterol (VENTOLIN HFA) 108 (90 Base) MCG/ACT inhaler Inhale 2 puffs into the lungs every 6 (six) hours as needed for wheezing or shortness of breath. 6.7 g 3  . amitriptyline (ELAVIL) 50 MG tablet TAKE 1 TABLET AT BEDTIME 90 tablet 1  . ASCORBIC ACID PO Take 1 tablet by mouth daily.     Marland Kitchen aspirin EC 81 MG tablet Take 81 mg by mouth daily.    . Blood Glucose Monitoring Suppl (TRUE METRIX AIR GLUCOSE METER) w/Device KIT     . busPIRone (BUSPAR) 5 MG tablet Take 1 tablet by mouth twice daily as needed for anxiety 60 tablet 0  . carvedilol (COREG) 6.25 MG tablet TAKE 1 TABLET TWICE DAILY (DOSE INCREASE) 180 tablet 3  . Cholecalciferol 1000 UNITS capsule Take 1 capsule (1,000 Units total) by mouth 2 (two) times daily.    . clotrimazole (LOTRIMIN) 1 % cream For athlete's feet, apply to both feet and between toes twice daily for 4 weeks. 45 g 1  . clotrimazole-betamethasone (LOTRISONE) cream Apply 1 application topically 2 (two) times daily. For 7 days maximum for severe jock itch . Stop if  worsening symptoms. 45 g 1  . escitalopram (LEXAPRO) 5 MG tablet Take 1 tablet (5 mg total) by mouth daily. 90 tablet 5  . famotidine (PEPCID) 20 MG tablet TAKE 1 TABLET TWICE DAILY 180 tablet 1  . finasteride (PROSCAR) 5 MG tablet TAKE 1 TABLET EVERY DAY 90 tablet 1  . furosemide (LASIX) 20 MG tablet TAKE 1 TABLET (20 MG TOTAL) BY MOUTH DAILY. 90 tablet 1  . gabapentin (NEURONTIN) 300 MG capsule Take 1 capsule (300 mg total) by mouth 2 (two) times daily. 180 capsule 3  . glucose blood (TRUE METRIX BLOOD GLUCOSE TEST) test strip TEST BLOOD SUGAR EVERY DAY 100 strip 1  . Multiple Vitamin (MULTIVITAMIN) capsule Take 1 capsule by mouth daily.      . mupirocin ointment (BACTROBAN) 2 % Apply to right foot ulcer once daily and cover with dressing. 30 g 1  . NONFORMULARY OR COMPOUNDED ITEM [facility]  Peripheral Neuropathy Cream- Bupivacaine 1%, Doxepin 3%, Gabapentin 6%, Pentoxifylline 3%, Topiramate 1% Apply 1-2 grams to affected area 3-4 times daily Qty. 120 gm 3 refills    . omeprazole (PRILOSEC) 20 MG capsule TAKE 1 CAPSULE DAILY 30 TO 60 MINUTES BEFORE FIRST MEAL OF THE DAY 90 capsule 1  . rosuvastatin (CRESTOR) 10 MG tablet Take 1 tablet (10 mg total) by mouth daily. Patient needs an appointment for further refills. 90 tablet 0  . sacubitril-valsartan (ENTRESTO) 49-51 MG Take 1 tablet by mouth 2 (two) times daily. 180 tablet 3  . sodium chloride (MURO 128) 5 % ophthalmic solution Place 1 drop into the left eye at bedtime.     . tamsulosin (FLOMAX) 0.4 MG CAPS capsule TAKE 1 CAPSULE EVERY DAY 90 capsule 1  . TRUEPLUS LANCETS 28G MISC Use to test blood sugars two times daily. Dx: E11.9 200 each 3   No current facility-administered medications for this visit.    REVIEW OF SYSTEMS:   10  Point review of Systems was done is negative except as noted above.  PHYSICAL EXAMINATION:  Vitals:   12/09/20 1443  BP: 137/60  Pulse: 73  Resp: 15  Temp: 97.8 F (36.6 C)  SpO2: 96%   Filed  Weights   12/09/20 1443  Weight: 210 lb 8 oz (95.5 kg)   .Body mass index is 28.55 kg/m.  Exam was given in a chair.  GENERAL:alert, in no acute distress and comfortable SKIN: no acute rashes, no significant lesions EYES: conjunctiva are pink and non-injected, sclera anicteric OROPHARYNX: MMM, no exudates, no oropharyngeal erythema or ulceration NECK: supple, no JVD LYMPH:  no palpable lymphadenopathy in the cervical, axillary or inguinal regions LUNGS: clear to auscultation b/l with normal respiratory effort HEART: regular rate & rhythm ABDOMEN:  normoactive bowel sounds , non tender, not distended. Extremity: no pedal edema PSYCH: alert & oriented x 3 with fluent speech NEURO: no focal motor/sensory deficits  LABORATORY DATA:  I have reviewed the data as listed  CBC Latest Ref Rng & Units 12/02/2020 10/12/2020 08/10/2020  WBC 4.0 - 10.5 K/uL 3.6(L) 4.8 6.3  Hemoglobin 13.0 - 17.0 g/dL 10.7(L) 11.5(L) 11.4(L)  Hematocrit 39.0 - 52.0 % 32.5(L) 34.5(L) 33.7(L)  Platelets 150 - 400 K/uL 68(L) 55(L) 88(L)   CBC    Component Value Date/Time   WBC 3.6 (L) 12/02/2020 1149   RBC 3.16 (L) 12/02/2020 1149   HGB 10.7 (L) 12/02/2020 1149   HGB 11.5 (L) 10/12/2020 1300   HCT 32.5 (L) 12/02/2020 1149   HCT 33.1 (L) 06/21/2018 1332   PLT 68 (L) 12/02/2020 1149   PLT 55 (L) 10/12/2020 1300   MCV 102.8 (H) 12/02/2020 1149   MCH 33.9 12/02/2020 1149   MCHC 32.9 12/02/2020 1149   RDW 14.8 12/02/2020 1149   LYMPHSABS 0.8 12/02/2020 1149   MONOABS 0.3 12/02/2020 1149   EOSABS 0.1 12/02/2020 1149   BASOSABS 0.0 12/02/2020 1149    CMP Latest Ref Rng & Units 12/02/2020 10/12/2020 08/12/2020  Glucose 70 - 99 mg/dL 181(H) 163(H) 169(H)  BUN 8 - 23 mg/dL '19 17 19  ' Creatinine 0.61 - 1.24 mg/dL 1.37(H) 1.21 1.33(H)  Sodium 135 - 145 mmol/L 138 141 139  Potassium 3.5 - 5.1 mmol/L 5.0 4.6 4.5  Chloride 98 - 111 mmol/L 105 108 103  CO2 22 - 32 mmol/L '26 29 28  ' Calcium 8.9 - 10.3 mg/dL 8.9 9.4  9.5  Total Protein 6.5 - 8.1 g/dL 6.4(L) 6.9 -  Total Bilirubin 0.3 - 1.2 mg/dL 0.6 0.7 -  Alkaline Phos 38 - 126 U/L 115 100 -  AST 15 - 41 U/L 28 25 -  ALT 0 - 44 U/L 14 15 -   Component     Latest Ref Rng & Units 06/21/2018  Folate, Hemolysate     Not Estab. ng/mL >620.0  HCT     37.5 - 51.0 % 33.1 (L)  Folate, RBC     >498 ng/mL >1,873  Methylmalonic Acid, Quantitative     0 - 378 nmol/L 294  DISCLAIMER      Comment  Specimen Type      [place]  Lead-Whole Blood     0 - 4 ug/dL 2  Vitamin B12     180 - 914 pg/mL 836  Homocysteine     0.0 - 15.0 umol/L 11.2    02/28/18 Surgical Pathology:   02/28/18 Cytogenetics:    RADIOGRAPHIC STUDIES: I have personally reviewed the radiological images as listed  and agreed with the findings in the report. MR Abdomen W Wo Contrast  Result Date: 12/03/2020 CLINICAL DATA:  Hepatocellular carcinoma post SB RT radiotherapy. Assess response to treatment. EXAM: MRI ABDOMEN WITHOUT AND WITH CONTRAST TECHNIQUE: Multiplanar multisequence MR imaging of the abdomen was performed both before and after the administration of intravenous contrast. CONTRAST:  42m GADAVIST GADOBUTROL 1 MMOL/ML IV SOLN COMPARISON:  MRI 07/07/2017 FINDINGS: Lower chest:  Lung bases are clear. Hepatobiliary: Round lesion in the central LEFT hepatic lobe measures 36 mm x 29 mm (image 8/series 5) compared with 44 mm x 33 mm. There is geographic region high signal intensity within liver parenchyma adjacent to the lesion consistent with radiation edema within liver parenchyma. On precontrast T1 weighted imaging there is high signal intensity within the lesion consistent with intratumoral hemorrhage or protein. Postcontrast enhanced imaging demonstrates persistent peripheral enhancement of the treated lesion (20/15). There is central hypoenhancement. Enhancement pattern similar to pre therapy comparison. Within the LEFT hepatic lobe subtle on lesion which is hyperintense on T2  weighted imaging (image 13/5) is noted. No clear enhancement on the postcontrast imaging. Pancreas: Normal pancreatic parenchymal intensity. No ductal dilatation or inflammation. Spleen: Normal spleen. Adrenals/urinary tract: Adrenal glands and kidneys are normal. Stomach/Bowel: Hiatal hernia. Small bowel limited view of the colon remarkable. Vascular/Lymphatic: No upper abdominal adenopathy. Musculoskeletal: No aggressive osseous lesion IMPRESSION: 1. Interval decrease in size LEFT hepatic lobe hepatocellular carcinoma following stereotactic radiation treatment. Enhancement pattern of lesion remains same. New edema within the adjacent liver parenchyma. 2. Subtle lesion evident on T2 weighted imaging in the LEFT lateral hepatic lobe. Recommend attention on follow-up as this could represent a regenerating nodule Electronically Signed   By: SSuzy BouchardM.D.   On: 12/03/2020 15:33    ASSESSMENT & PLAN:   80y.o. male with  1. Thrombocytopenia  Mild Plt 127k with minimal normocytic anemia. 02/28/18 surgical pathology report with no overt pathology, could not rule out MDS, and noted an increase in ring sideroblasts 02/28/18 BM cytogenetics report was unremarkable  2. 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 2. 4 cm hypervascular mass in segment 4A of left hepatic lobe, and 8 mm hypervascular nodule in posterior right hepatic lobe. These are highly suspicious for hepatocellular carcinomas in the setting of cirrhosis. 3. No evidence of metastatic disease." -03/27/2020 MRI Abdomen ([id number] revealed "1. The dominant lesion of concern in the medial segment of the left hepatic lobe (segment 4 A) is diagnostic of hepatocellular carcinoma (LR 5). 2. The other small lesion questioned inferiorly in the right  lobe is not clearly seen, although evaluation of this area is mildly limited by motion artifact."  PLAN: -Discussed pt labwork 12/02/2020; some anemia, Plt low due to liver issues, overall very stable. -Discussed pt MR Abd w wo contrast ([id number] on 12/02/2020; spot in left liver shrunk in size Some inflammation from radiation effects. Finding of new nodule in left lobe of liver that could be regenerative nodule. Will continue to monitor this with repeat MRI in future. -Advised pt we will continue as current plan at this time.  -Will get repeat MRI and tumor markers in 3-4 months to track for progression.  -No lab or clinical evidence of Hepatocellular Carcinoma progression at this time. -Will see back in 4 months with labs and repeat scans.   FOLLOW UP:  MRI abd w and w/o contrast in 15 weeks Labs in 15 weeks RTC with Dr Irene Limbo in 16 weeks    The total time spent in the appointment was 20 minutes and more than 50% was on counseling and direct patient cares.   All of the patient's questions were answered with apparent satisfaction. The patient knows to call the clinic with any problems, questions or concerns.   (Office):       [phone] (Work cell):  [phone] (Fax):           [phone]  12/08/2020 12:28 PM  I, Reinaldo Raddle, am acting as scribe for Dr. Sullivan Lone, MD.     I have reviewed the above documentation for accuracy and completeness, and I agree with the above. Joshua Genera MD

## 2020-12-09 ENCOUNTER — Telehealth: Payer: Self-pay | Admitting: Hematology

## 2020-12-09 ENCOUNTER — Inpatient Hospital Stay: Payer: Medicare HMO | Attending: Hematology | Admitting: Hematology

## 2020-12-09 VITALS — BP 137/60 | HR 73 | Temp 97.8°F | Resp 15 | Ht 72.0 in | Wt 210.5 lb

## 2020-12-09 DIAGNOSIS — E538 Deficiency of other specified B group vitamins: Secondary | ICD-10-CM | POA: Insufficient documentation

## 2020-12-09 DIAGNOSIS — D696 Thrombocytopenia, unspecified: Secondary | ICD-10-CM | POA: Insufficient documentation

## 2020-12-09 DIAGNOSIS — Z87891 Personal history of nicotine dependence: Secondary | ICD-10-CM | POA: Diagnosis not present

## 2020-12-09 DIAGNOSIS — C220 Liver cell carcinoma: Secondary | ICD-10-CM | POA: Diagnosis not present

## 2020-12-09 NOTE — Telephone Encounter (Signed)
Scheduled appointments per 3/2 los. Spoke to patient who is aware of appointments dates and times. Gave patient calendar print out.  

## 2020-12-11 NOTE — Progress Notes (Signed)
Cardiology Office Note  Date: 12/14/2020   ID: [name], [name] 09/25/1941, MRN [id number]  PCP:  Joshua Olp, MD  Cardiologist:  Joshua Lesches, MD Electrophysiologist:  None   Chief Complaint  Patient presents with  . Cardiac follow-up    History of Present Illness: [name] is a 80 y.o. male last seen in October 2021 by [name].  He presents today with his wife for a follow-up visit.  From a cardiac perspective, he reports no progressive shortness of breath, no exertional chest pain, palpitations, or syncope.  His weight has been stable.  He continues to follow in oncology with [name], history of hepatocellular carcinoma.  I reviewed his medications which are outlined below and stable from a cardiac perspective.  Last assessment of LVEF in September 2021 was 50 to 55%.  I went over his most recent lab work from February.  Past Medical History:  Diagnosis Date  . Benign prostatic hypertrophy   . CHF (congestive heart failure) ([place])    a. EF 20-25% by echo in 11/2019  . COPD (chronic obstructive pulmonary disease) ([place]) December 15, 2009   FEV1 2.30 (70%) ratio 63 no better with B2 and DLCO 18/6 (73%) corrects to 106%  . Degenerative joint disease   . Depression   . Diverticulosis   . Essential hypertension    ACE inhibitor cough  . Fatty liver   . Hyperlipidemia   . Internal hemorrhoids   . Low back pain   . Otitis externa 07/30/2012  . Splenomegaly   . Type 2 diabetes mellitus ([place])   . Ulcer of foot ([place])    Right foot    Past Surgical History:  Procedure Laterality Date  . IR EMBO TUMOR ORGAN ISCHEMIA INFARCT INC GUIDE ROADMAPPING  06/01/2020  . IR RADIOLOGIST EVAL & MGMT  04/28/2020  . IR RADIOLOGIST EVAL & MGMT  07/15/2020  . NO PAST SURGERIES  1980    Current Outpatient Medications  Medication Sig Dispense Refill  . acyclovir (ZOVIRAX) 400 MG tablet TAKE 1 TABLET TWICE DAILY 180 tablet 1  . albuterol (VENTOLIN HFA) 108 (90 Base)  MCG/ACT inhaler Inhale 2 puffs into the lungs every 6 (six) hours as needed for wheezing or shortness of breath. 6.7 g 3  . amitriptyline (ELAVIL) 50 MG tablet TAKE 1 TABLET AT BEDTIME 90 tablet 1  . ASCORBIC ACID PO Take 1 tablet by mouth daily.     Marland Kitchen aspirin EC 81 MG tablet Take 81 mg by mouth daily.    . Blood Glucose Monitoring Suppl (TRUE METRIX AIR GLUCOSE METER) w/Device KIT     . busPIRone (BUSPAR) 5 MG tablet Take 1 tablet by mouth twice daily as needed for anxiety 60 tablet 0  . carvedilol (COREG) 6.25 MG tablet TAKE 1 TABLET TWICE DAILY (DOSE INCREASE) 180 tablet 3  . Cholecalciferol 1000 UNITS capsule Take 1 capsule (1,000 Units total) by mouth 2 (two) times daily.    . clotrimazole (LOTRIMIN) 1 % cream For athlete's feet, apply to both feet and between toes twice daily for 4 weeks. 45 g 1  . clotrimazole-betamethasone (LOTRISONE) cream Apply 1 application topically 2 (two) times daily. For 7 days maximum for severe jock itch . Stop if worsening symptoms. 45 g 1  . escitalopram (LEXAPRO) 5 MG tablet Take 1 tablet (5 mg total) by mouth daily. 90 tablet 5  . famotidine (PEPCID) 20 MG tablet TAKE 1 TABLET TWICE DAILY 180 tablet  1  . finasteride (PROSCAR) 5 MG tablet TAKE 1 TABLET EVERY DAY 90 tablet 1  . furosemide (LASIX) 20 MG tablet TAKE 1 TABLET (20 MG TOTAL) BY MOUTH DAILY. 90 tablet 1  . gabapentin (NEURONTIN) 300 MG capsule Take 1 capsule (300 mg total) by mouth 2 (two) times daily. 180 capsule 3  . glucose blood (TRUE METRIX BLOOD GLUCOSE TEST) test strip TEST BLOOD SUGAR EVERY DAY 100 strip 1  . Multiple Vitamin (MULTIVITAMIN) capsule Take 1 capsule by mouth daily.    . mupirocin ointment (BACTROBAN) 2 % Apply to right foot ulcer once daily and cover with dressing. 30 g 1  . NONFORMULARY OR COMPOUNDED ITEM [facility]  Peripheral Neuropathy Cream- Bupivacaine 1%, Doxepin 3%, Gabapentin 6%, Pentoxifylline 3%, Topiramate 1% Apply 1-2 grams to affected area 3-4 times  daily Qty. 120 gm 3 refills    . omeprazole (PRILOSEC) 20 MG capsule TAKE 1 CAPSULE DAILY 30 TO 60 MINUTES BEFORE FIRST MEAL OF THE DAY 90 capsule 1  . rosuvastatin (CRESTOR) 10 MG tablet Take 1 tablet (10 mg total) by mouth daily. Patient needs an appointment for further refills. 90 tablet 0  . sacubitril-valsartan (ENTRESTO) 49-51 MG Take 1 tablet by mouth 2 (two) times daily. 180 tablet 3  . sodium chloride (MURO 128) 5 % ophthalmic solution Place 1 drop into the left eye at bedtime.     . tamsulosin (FLOMAX) 0.4 MG CAPS capsule TAKE 1 CAPSULE EVERY DAY 90 capsule 1  . TRUEPLUS LANCETS 28G MISC Use to test blood sugars two times daily. Dx: E11.9 200 each 3  . FLUZONE HIGH-DOSE QUADRIVALENT 0.7 ML SUSY      No current facility-administered medications for this visit.   Allergies:  Patient has no known allergies.   ROS: No palpitations or syncope.  Physical Exam: VS:  BP 120/70   Pulse 72   Ht '6\' 1"'  (1.854 m)   Wt 209 lb (94.8 kg)   SpO2 95%   BMI 27.57 kg/m , BMI Body mass index is 27.57 kg/m.  Wt Readings from Last 3 Encounters:  12/14/20 209 lb (94.8 kg)  12/09/20 210 lb 8 oz (95.5 kg)  10/12/20 210 lb 1.6 oz (95.3 kg)    General: Patient appears comfortable at rest. HEENT: Conjunctiva and lids normal, wearing a mask. Neck: Supple, no elevated JVP or carotid bruits, no thyromegaly. Lungs: Clear to auscultation, nonlabored breathing at rest. Cardiac: Regular rate and rhythm, no S3 or significant systolic murmur. Abdomen: Soft, nontender, bowel sounds present. Extremities: No pitting edema.  ECG:  An ECG dated 12/03/2019 was personally reviewed today and demonstrated:  Sinus tachycardia with R' in lead V1, poor R wave progression, nonspecific T wave changes.  Recent Labwork: 12/02/2020: ALT 14; AST 28; BUN 19; Creatinine 1.37; Hemoglobin 10.7; Platelets 68; Potassium 5.0; Sodium 138     Component Value Date/Time   CHOL 120 11/07/2019 1146   TRIG 126.0 11/07/2019 1146    HDL 45.60 11/07/2019 1146   CHOLHDL 3 11/07/2019 1146   VLDL 25.2 11/07/2019 1146   LDLCALC 49 11/07/2019 1146   LDLDIRECT 57.0 10/22/2018 1451    Other Studies Reviewed Today:  Echocardiogram 06/17/2020: 1. Left ventricular ejection fraction, by estimation, is 50 to 55%. The  left ventricle has low normal function. The left ventricle has no regional  wall motion abnormalities. There is mild left ventricular hypertrophy.  Left ventricular diastolic  parameters are consistent with Grade I diastolic dysfunction (impaired  relaxation). Elevated left  atrial pressure.  2. Right ventricular systolic function is normal. The right ventricular  size is normal.  3. Left atrial size was severely dilated.  4. The mitral valve is normal in structure. No evidence of mitral valve  regurgitation. No evidence of mitral stenosis.  5. The aortic valve was not well visualized. There is moderate  calcification of the aortic valve. There is moderate thickening of the  aortic valve. Aortic valve regurgitation is not visualized. Mild to  moderate aortic valve stenosis. Aortic valve mean  gradient measures 16.0 mmHg. Aortic valve peak gradient measures 25.8  mmHg. Aortic valve area, by VTI measures 1.23 cm.  6. The inferior vena cava is normal in size with greater than 50%  respiratory variability, suggesting right atrial pressure of 3 mmHg.   Assessment and Plan:  1.  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  Invasive cardiac testing has not been pursued in light of hepatocellular carcinoma and clinical improvement.  His weight is stable.  Continue Coreg, Lasix, and Entresto.  Recent creatinine 1.37 with high normal potassium.  Holding off on Aldactone.  2.  Mixed hyperlipidemia, on Crestor.  LDL 49.  3.  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  Asymptomatic at this time.  4.  CKD stage IIIb,  creatinine 1.37.  Medication Adjustments/Labs and Tests Ordered: Current medicines are reviewed at length with the patient today.  Concerns regarding medicines are outlined above.   Tests Ordered: Orders Placed This Encounter  Procedures  . EKG 12-Lead    Medication Changes: No orders of the defined types were placed in this encounter.   Disposition:  Follow up 6 months in the [place] office.  Signed, Satira Sark, MD, [facility] 12/14/2020 2:15 PM    [facility] [address], [place], [place] [address] Phone: [phone]; Fax: [phone]

## 2020-12-13 NOTE — Progress Notes (Signed)
  Subjective:  Patient ID: [name], male    DOB: 1940-10-29,  MRN: [id number]  80 y.o. male is seen for follow up at risk foot care. He has h/o diabetes and ulceration submet head 1 right foot. Ulceration submet head 1 has healed.  He relates his blood glucose was 142 mg/dl this morning.   Patient is wearing diabetic shoes with new offloaded insoles.   He states he has had five radiation treatments for his lung cancer. He is awaiting results of his MRI.   No Known Allergies   Objective:  Physical Exam: There were no vitals filed for this visit.   [name] is a pleasant 80 y.o. Caucasian male in NAD. AAO x 3.  Vascular Examination: Neurovascular status unchanged b/l lower extremities. Capillary refill time to digits immediate b/l. Palpable pedal pulses b/l LE. Pedal hair absent. Lower extremity skin temperature gradient within normal limits. No  pain with calf compression b/l. No edema noted b/l lower extremities.   Dermatological Examination: Pedal skin with normal turgor, texture and tone bilaterally. No open wounds bilaterally. No interdigital macerations bilaterally. Toenails 1-5 b/l elongated, discolored, dystrophic, thickened, crumbly with subungual debris and tenderness to dorsal palpation.   Hyperkeratotic lesion submet head 1 right foot. No edema, no erythema, no drainage, no fluctuance.  Musculoskeletal Examination: Normal muscle strength 5/5 to all lower extremity muscle groups bilaterally. No pain crepitus or joint limitation noted with ROM b/l. Hammertoes noted to the 1-5 bilaterally.  Neurological Examination: Protective sensation diminished with 10g monofilament b/l. Clonus negative b/l.  Hemoglobin A1C Latest Ref Rng & Units 02/25/2020  HGBA1C 4.0 - 5.6 % 5.9(A)  Some recent data might be hidden   Assessment:   1. Pain due to onychomycosis of toenails of both feet   2. Callus   3. Acquired hammertoes of both feet   4. Diabetic peripheral neuropathy  associated with type 2 diabetes mellitus ([place])    Plan:  -Patient was evaluated and treated and all questions answered.  -Continue diabetic foot care principles. -Toenails 1-5 b/l debrided in length and girth without iatrogenic laceration. -Calluses pared submetatarsal head(s) 1 right foot utilizing sterile scalpel blade without incident. Dispensed hook-on metatarsal foam pad for daily use. Apply every morning. Remove every evening. -Continue offloaded diabetic shoes daily. -Patient to report any pedal injuries to medical professional immediately. -Patient/POA to call should there be question/concern in the interim.  Return in about 3 months (around 03/10/2021).  Marzetta Board, DPM

## 2020-12-14 ENCOUNTER — Other Ambulatory Visit: Payer: Self-pay

## 2020-12-14 ENCOUNTER — Encounter: Payer: Self-pay | Admitting: Cardiology

## 2020-12-14 ENCOUNTER — Ambulatory Visit: Payer: Medicare HMO | Admitting: Cardiology

## 2020-12-14 VITALS — BP 120/70 | HR 72 | Ht 73.0 in | Wt 209.0 lb

## 2020-12-14 DIAGNOSIS — I429 Cardiomyopathy, unspecified: Secondary | ICD-10-CM | POA: Diagnosis not present

## 2020-12-14 DIAGNOSIS — I350 Nonrheumatic aortic (valve) stenosis: Secondary | ICD-10-CM

## 2020-12-14 NOTE — Patient Instructions (Signed)
Medication Instructions:  Your physician recommends that you continue on your current medications as directed. Please refer to the Current Medication list given to you today. You have been given samples of Entresto  *If you need a refill on your cardiac medications before your next appointment, please call your pharmacy*   Lab Work: NONE   If you have labs (blood work) drawn today and your tests are completely normal, you will receive your results only by: Marland Kitchen MyChart Message (if you have MyChart) OR . A paper copy in the mail If you have any lab test that is abnormal or we need to change your treatment, we will call you to review the results.   Testing/Procedures: NONE    Follow-Up: At [facility], you and your health needs are our priority.  As part of our continuing mission to provide you with exceptional heart care, we have created designated Provider Care Teams.  These Care Teams include your primary Cardiologist (physician) and Advanced Practice Providers (APPs -  Physician Assistants and Nurse Practitioners) who all work together to provide you with the care you need, when you need it.  We recommend signing up for the patient portal called "MyChart".  Sign up information is provided on this After Visit Summary.  MyChart is used to connect with patients for Virtual Visits (Telemedicine).  Patients are able to view lab/test results, encounter notes, upcoming appointments, etc.  Non-urgent messages can be sent to your provider as well.   To learn more about what you can do with MyChart, go to [email or web address].    Your next appointment:   6 month(s)  The format for your next appointment:   In Person  Provider:   You may see Rozann Lesches, MD or one of the following Advanced Practice Providers on your designated Care Team:    Bernerd Pho, PA-C   Ermalinda Barrios, PA-C     Other Instructions Thank you for choosing [facility]!

## 2020-12-15 ENCOUNTER — Ambulatory Visit (INDEPENDENT_AMBULATORY_CARE_PROVIDER_SITE_OTHER): Payer: Medicare HMO | Admitting: Family Medicine

## 2020-12-15 ENCOUNTER — Encounter: Payer: Self-pay | Admitting: Family Medicine

## 2020-12-15 VITALS — BP 136/74 | HR 72 | Temp 98.1°F | Ht 72.0 in | Wt 208.4 lb

## 2020-12-15 DIAGNOSIS — K7030 Alcoholic cirrhosis of liver without ascites: Secondary | ICD-10-CM

## 2020-12-15 DIAGNOSIS — F3342 Major depressive disorder, recurrent, in full remission: Secondary | ICD-10-CM

## 2020-12-15 DIAGNOSIS — E1142 Type 2 diabetes mellitus with diabetic polyneuropathy: Secondary | ICD-10-CM | POA: Diagnosis not present

## 2020-12-15 DIAGNOSIS — E08621 Diabetes mellitus due to underlying condition with foot ulcer: Secondary | ICD-10-CM

## 2020-12-15 DIAGNOSIS — F419 Anxiety disorder, unspecified: Secondary | ICD-10-CM | POA: Diagnosis not present

## 2020-12-15 DIAGNOSIS — J449 Chronic obstructive pulmonary disease, unspecified: Secondary | ICD-10-CM

## 2020-12-15 DIAGNOSIS — I700 Atherosclerosis of aorta: Secondary | ICD-10-CM

## 2020-12-15 DIAGNOSIS — L97412 Non-pressure chronic ulcer of right heel and midfoot with fat layer exposed: Secondary | ICD-10-CM

## 2020-12-15 LAB — POCT GLYCOSYLATED HEMOGLOBIN (HGB A1C): Hemoglobin A1C: 6.5 % — AB (ref 4.0–5.6)

## 2020-12-15 MED ORDER — BUSPIRONE HCL 7.5 MG PO TABS: 7.5000 mg | ORAL_TABLET | Freq: Two times a day (BID) | ORAL | 3 refills | Status: DC | PRN

## 2020-12-15 MED ORDER — METFORMIN HCL 500 MG PO TABS
500.0000 mg | ORAL_TABLET | Freq: Three times a day (TID) | ORAL | 3 refills | Status: DC
Start: 2020-12-15 — End: 2021-07-19

## 2020-12-15 NOTE — Progress Notes (Signed)
Phone [phone] In person visit   Subjective:   [name] is a 80 y.o. year old very pleasant male patient who presents for/with See problem oriented charting Chief Complaint  Patient presents with  . Diabetes  . Hyperlipidemia  . Gastroesophageal Reflux  . COPD   This visit occurred during the SARS-CoV-2 public health emergency.  Safety protocols were in place, including screening questions prior to the visit, additional usage of staff PPE, and extensive cleaning of exam room while observing appropriate contact time as indicated for disinfecting solutions.   Past Medical History-  Patient Active Problem List   Diagnosis Date Noted  . 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[place]) 12/14/2019    Priority: High  . Chronic systolic heart failure ([place]) 12/05/2019    Priority: High  . Thrombocytopenia ([place]) 12/21/2015    Priority: High  . 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 ILD (interstitial lung disease) ([place]) 07/05/2012    Priority: High  . COPD (chronic obstructive pulmonary disease) ([place]) 12/15/2009    Priority: High  . Chronic back pain. Off narcotics 06/05/17 due to negative UDS for opiates x2. Full history 06/12/14. Pain contract signed.  05/02/2007    Priority: High  . DM (diabetes mellitus) type II controlled, neurological manifestation ([place]) 04/06/2007    Priority: High  . B12 deficiency 08/20/2015    Priority: Medium  . Diabetic polyneuropathy associated with type 2 diabetes mellitus ([place]) 08/07/2015    Priority: Medium  . CKD (chronic kidney disease), stage III ([place]) 05/06/2015    Priority: Medium  . Fatty liver 11/04/2014    Priority: Medium  . BPH (benign prostatic hyperplasia) 06/20/2007    Priority: Medium  . Depression 05/02/2007    Priority: Medium  . Hyperlipidemia associated with type 2 diabetes mellitus ([place]) 04/06/2007    Priority: Medium  . Essential hypertension 04/06/2007    Priority: Medium  . Osteoarthritis 04/06/2007     Priority: Medium  . Aortic atherosclerosis ([place]) 12/14/2019    Priority: Low  . GERD (gastroesophageal reflux disease) 09/30/2017    Priority: Low  . 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 positive 03/29/2016    Priority: Low  . 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 Hepatocellular carcinoma ([place]) 06/01/2020  . Anxiety 02/25/2020  . Diabetic ulcer of right midfoot associated with diabetes mellitus due to underlying condition, with fat layer exposed ([place]) 11/07/2019  . Anemia 03/05/2018  . Diplopia 06/05/2015  . PCO (posterior capsular opacification) 06/19/2013  . 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 Pseudophakia of left eye 04/10/2013  . Nuclear cataract 01/20/2012  . 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 acyclovir (ZOVIRAX) 400 MG tablet TAKE 1 TABLET TWICE DAILY 180 tablet 1  . albuterol (VENTOLIN HFA) 108 (90 Base) MCG/ACT inhaler Inhale 2 puffs into the lungs every 6 (six) hours as needed for wheezing or shortness of breath. 6.7 g 3  . ASCORBIC ACID PO Take 1 tablet by mouth daily.     Marland Kitchen aspirin EC 81 MG tablet Take 81 mg by mouth daily.    . Blood Glucose Monitoring Suppl (TRUE METRIX AIR GLUCOSE METER) w/Device KIT     . carvedilol (COREG) 6.25 MG tablet TAKE 1 TABLET TWICE DAILY (DOSE INCREASE) 180 tablet 3  . Cholecalciferol 1000 UNITS capsule Take 1 capsule (1,000 Units total) by mouth 2 (two) times daily.    Marland Kitchen  clotrimazole (LOTRIMIN) 1 % cream For athlete's feet, apply to both feet and between toes twice daily for 4 weeks. 45 g 1  . clotrimazole-betamethasone (LOTRISONE) cream Apply 1 application topically 2 (two) times daily. For 7 days maximum for severe jock itch . Stop if worsening symptoms. 45 g 1  . escitalopram (LEXAPRO) 5 MG tablet Take 1 tablet (5 mg total) by mouth daily. 90 tablet 5  . famotidine (PEPCID) 20 MG tablet TAKE 1 TABLET TWICE DAILY 180  tablet 1  . finasteride (PROSCAR) 5 MG tablet TAKE 1 TABLET EVERY DAY 90 tablet 1  . gabapentin (NEURONTIN) 300 MG capsule Take 1 capsule (300 mg total) by mouth 2 (two) times daily. 180 capsule 3  . glucose blood (TRUE METRIX BLOOD GLUCOSE TEST) test strip TEST BLOOD SUGAR EVERY DAY 100 strip 1  . metFORMIN (GLUCOPHAGE) 500 MG tablet Take 1 tablet (500 mg total) by mouth 3 (three) times daily with meals. 270 tablet 3  . Multiple Vitamin (MULTIVITAMIN) capsule Take 1 capsule by mouth daily.    . mupirocin ointment (BACTROBAN) 2 % Apply to right foot ulcer once daily and cover with dressing. 30 g 1  . NONFORMULARY OR COMPOUNDED ITEM [facility]  Peripheral Neuropathy Cream- Bupivacaine 1%, Doxepin 3%, Gabapentin 6%, Pentoxifylline 3%, Topiramate 1% Apply 1-2 grams to affected area 3-4 times daily Qty. 120 gm 3 refills    . omeprazole (PRILOSEC) 20 MG capsule TAKE 1 CAPSULE DAILY 30 TO 60 MINUTES BEFORE FIRST MEAL OF THE DAY 90 capsule 1  . rosuvastatin (CRESTOR) 10 MG tablet Take 1 tablet (10 mg total) by mouth daily. Patient needs an appointment for further refills. 90 tablet 0  . sacubitril-valsartan (ENTRESTO) 49-51 MG Take 1 tablet by mouth 2 (two) times daily. 180 tablet 3  . sodium chloride (MURO 128) 5 % ophthalmic solution Place 1 drop into the left eye at bedtime.     . tamsulosin (FLOMAX) 0.4 MG CAPS capsule TAKE 1 CAPSULE EVERY DAY 90 capsule 1  . TRUEPLUS LANCETS 28G MISC Use to test blood sugars two times daily. Dx: E11.9 200 each 3  . busPIRone (BUSPAR) 7.5 MG tablet Take 1 tablet (7.5 mg total) by mouth 2 (two) times daily as needed. for anxiety 180 tablet 3  . furosemide (LASIX) 20 MG tablet TAKE 1 TABLET (20 MG TOTAL) BY MOUTH DAILY. 90 tablet 1   No current facility-administered medications for this visit.     Objective:  BP 136/74   Pulse 72   Temp 98.1 F (36.7 C) (Temporal)   Ht 6' (1.829 m)   Wt 208 lb 6.4 oz (94.5 kg)   SpO2 95%   BMI 28.26 kg/m  Gen:  NAD, resting comfortably CV: RRR no murmurs rubs or gallops Lungs: CTAB no crackles, wheeze, rhonchi Abdomen: soft/nontender/nondistended/normal bowel sounds. Ext: trace edema Skin: warm, dry    Assessment and Plan    #Hepatocellular carcinoma-following with Dr. Irene Limbo S: Not a surgical candidate.  Patient has undergone radiation treatment and reports "shrunk 30%" with follow up in 2 months from last scan A/P: hopefully stable on next check- continue close follow up with oncology Dr. Irene Limbo  -cancer related to alcoholic cirrhosis- thankfully remains alcohol free for 40 years but dealing with consequences from prior use. lfts stable  #ILD/COPD S: Patient carries a diagnosis of COPD due to former smoking.  Also previously diagnosed as having interstitial lung disease by pulmonology.  He does not follow-up with them anymore.  Fortunately  breathing issues have not progressed- stable  He has albuterol but uses it 1-3x a day- stable at present A/P:Stable. Continue current medications.    # Diabetes S: compliant with metformin 1040m in AM and 5062min the evening Lab Results  Component Value Date   HGBA1C 5.9 (A) 02/25/2020   HGBA1C 5.9 (A) 11/07/2019   HGBA1C 6.6 (A) 02/22/2019  A/P: a1c well controlled at 6.5 today POC. Will continue current meds (previously took off due to diarrhea but doing ok now)  - diabetic ulcer- close podiatry follow up should be continued- reports stable to slightly improved  #/Congestive heart failure with EF 20 to 25%/hypertension/CKD stage III S: Post heart failure hospitalization patient compliant with carvedilol, Lasix 20 mg, Entresto started by cardiology. Prior to heart failure:  amlodipine 1031mlisinopril 2.5mg67mThankfully later EF improved to 50 to 55% BP Readings from Last 3 Encounters:  12/15/20 136/74  12/14/20 120/70  12/09/20 137/60   Today, stable SOB for last 20 years- no worsening. No chest pain. No increased swelling  Chronic kidney  disease has been stable with GFR in the 50s-most recently at 52 about 2 weeks ago A/P: CHF-appears stable- continue current meds  Hypertension-well controlled on repeat- continue current meds   CKD stage III-stable on most recent labs. Continue to monitor   #hyperlipidemia S: compliant with rosuvastatin 10mg25mL goal under 70 Lab Results  Component Value Date   CHOL 120 11/07/2019   HDL 45.60 11/07/2019   LDLCALC 49 11/07/2019   LDLDIRECT 57.0 10/22/2018   TRIG 126.0 11/07/2019   CHOLHDL 3 11/07/2019   A/P: Well-controlled on last check-likely update next blood work-since he just had a CBC and CMP we opted not to check for blood work and only took point-of-care A1c- bruised up pretty bad from draw 2 weeks ago  # Depression Well controlled with Lexapro 5 mg started in 2021 -patient denies any sleep issues which was primary reason he was using amitriptyline-previously been on amitriptyline 50 mg but we titrated off due to fall risk/age/potential memory concerns-appears automatically later refilled to refill protocols but he states not taking- will remove from list -Also uses buspirone as needed for anxiety Patient stopped taking Prozac in 2020 or 2021.   A/P: depression reasonably well controlled. Some stress caring for wife with knee issues- asks to go up on buspirone- will try 7.5 mg twice daily  Recommended follow up: Return in about 4 months (around 04/16/2021) for physical or sooner if needed. Future Appointments  Date Time Provider Depar[place]/2022  1:30 PM [facility] CCM PHARMACIST [facility]  03/19/2021  3:00 PM [place]etta Board [facility]  03/24/2021  1:00 PM [facility] LAB [facility] None  04/01/2021  3:00 PM Kale,Brunetta Gen[facility] None  07/05/2021  1:40 PM McDowSatira SarkCVD-[facility]  08/09/2021  1:00 PM [facility] HEALTH COACH [facility]    Lab/Order associations:   ICD-10-CM   1. Controlled type 2 diabetes  mellitus with diabetic polyneuropathy, without long-term current use of insulin (HCC)  E11.42 POCT glycosylated hemoglobin (Hb A1C)  2. Anxiety  F41.9   3. 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HCC) Chronic K70.30   4. Chronic obstructive pulmonary disease, unspecified COPD type (HCC) Chronic J44.9   5. Recurrent major depressive disorder, in full remission (HCC) [place] F33.42   6. Aortic atherosclerosis (HCC) Chronic I70.0     Meds ordered this encounter  Medications  . busPIRone (BUSPAR) 7.5  MG tablet    Sig: Take 1 tablet (7.5 mg total) by mouth 2 (two) times daily as needed. for anxiety    Dispense:  180 tablet    Refill:  3  . metFORMIN (GLUCOPHAGE) 500 MG tablet    Sig: Take 1 tablet (500 mg total) by mouth 3 (three) times daily with meals.    Dispense:  270 tablet    Refill:  3   Return precautions advised.  [name], MD

## 2020-12-15 NOTE — Patient Instructions (Addendum)
  Health Maintenance Due  Topic Date Due  . TETANUS/TDAP consider Tdap at your pharmacy 06/10/2016  . OPHTHALMOLOGY EXAM-please schedule this 01/25/2019  . COVID-19 Vaccine (3 - Moderna risk 4-dose series) has had doesn't remember dates and don't have card.  Please consider contacting pharmacy and finding date and then letting us know 12/30/2019  . COLONOSCOPY considering your liver cancer diagnosis-this is low yield right now- we know there is some risk but lets wait and see how next few scans look 02/27/2020  . INFLUENZA VACCINE please contact us if you can find a date 05/10/2020   Only change today is trying buspirone 7.5 mg twice daily for anxiety- continue all other meds  Recommended follow up: Return in about 4 months (around 04/16/2021) for physical or sooner if needed. Schedule before you leave

## 2020-12-17 NOTE — Chronic Care Management (AMB) (Signed)
Chronic Care Management Pharmacy Assistant   Name: [name]  MRN: [id number] DOB: 1941-07-31  Reason for Encounter: Patient Assistance Documentation   Medications: Outpatient Encounter Medications as of 11/23/2020  Medication Sig Note   acyclovir (ZOVIRAX) 400 MG tablet TAKE 1 TABLET TWICE DAILY    albuterol (VENTOLIN HFA) 108 (90 Base) MCG/ACT inhaler Inhale 2 puffs into the lungs every 6 (six) hours as needed for wheezing or shortness of breath.    ASCORBIC ACID PO Take 1 tablet by mouth daily.     aspirin EC 81 MG tablet Take 81 mg by mouth daily.    Blood Glucose Monitoring Suppl (TRUE METRIX AIR GLUCOSE METER) w/Device KIT     Cholecalciferol 1000 UNITS capsule Take 1 capsule (1,000 Units total) by mouth 2 (two) times daily.    clotrimazole (LOTRIMIN) 1 % cream For athlete's feet, apply to both feet and between toes twice daily for 4 weeks.    clotrimazole-betamethasone (LOTRISONE) cream Apply 1 application topically 2 (two) times daily. For 7 days maximum for severe jock itch . Stop if worsening symptoms. 12/03/2019: Unknown start date    escitalopram (LEXAPRO) 5 MG tablet Take 1 tablet (5 mg total) by mouth daily.    famotidine (PEPCID) 20 MG tablet TAKE 1 TABLET TWICE DAILY    finasteride (PROSCAR) 5 MG tablet TAKE 1 TABLET EVERY DAY    gabapentin (NEURONTIN) 300 MG capsule Take 1 capsule (300 mg total) by mouth 2 (two) times daily.    glucose blood (TRUE METRIX BLOOD GLUCOSE TEST) test strip TEST BLOOD SUGAR EVERY DAY    Multiple Vitamin (MULTIVITAMIN) capsule Take 1 capsule by mouth daily.    mupirocin ointment (BACTROBAN) 2 % Apply to right foot ulcer once daily and cover with dressing.    NONFORMULARY OR COMPOUNDED ITEM [facility]  Peripheral Neuropathy Cream- Bupivacaine 1%, Doxepin 3%, Gabapentin 6%, Pentoxifylline 3%, Topiramate 1% Apply 1-2 grams to affected area 3-4 times daily Qty. 120 gm 3 refills 08/03/2020: As needed   omeprazole  (PRILOSEC) 20 MG capsule TAKE 1 CAPSULE DAILY 30 TO 60 MINUTES BEFORE FIRST MEAL OF THE DAY    rosuvastatin (CRESTOR) 10 MG tablet Take 1 tablet (10 mg total) by mouth daily. Patient needs an appointment for further refills.    sacubitril-valsartan (ENTRESTO) 49-51 MG Take 1 tablet by mouth 2 (two) times daily.    sodium chloride (MURO 128) 5 % ophthalmic solution Place 1 drop into the left eye at bedtime.     tamsulosin (FLOMAX) 0.4 MG CAPS capsule TAKE 1 CAPSULE EVERY DAY    TRUEPLUS LANCETS 28G MISC Use to test blood sugars two times daily. Dx: E11.9    [DISCONTINUED] amitriptyline (ELAVIL) 50 MG tablet TAKE 1 TABLET AT BEDTIME    [DISCONTINUED] busPIRone (BUSPAR) 5 MG tablet Take 1 tablet (5 mg total) by mouth 2 (two) times daily as needed (anxiety).    [DISCONTINUED] carvedilol (COREG) 6.25 MG tablet Take 1 tablet (6.25 mg total) by mouth 2 (two) times daily.    No facility-administered encounter medications on file as of 11/23/2020.   Patient assistance application for medication Entresto completed and mailed to the patient.  Patient to return completed form to prescribing providers office.  April D Calhoun, [place] Pharmacist Assistant [phone]

## 2020-12-23 DIAGNOSIS — J449 Chronic obstructive pulmonary disease, unspecified: Secondary | ICD-10-CM | POA: Diagnosis not present

## 2020-12-23 DIAGNOSIS — J9611 Chronic respiratory failure with hypoxia: Secondary | ICD-10-CM | POA: Diagnosis not present

## 2021-01-04 ENCOUNTER — Other Ambulatory Visit: Payer: Self-pay | Admitting: Family Medicine

## 2021-01-13 ENCOUNTER — Telehealth: Payer: Self-pay

## 2021-01-13 NOTE — Chronic Care Management (AMB) (Signed)
Chronic Care Management Pharmacy Assistant   Name: [name]  MRN: [id number] DOB: [birth year]   Reason for Encounter: Disease State-DM    Recent office visits:  12/15/20-Stephen Yong Channel (PCP)  Recent consult visits:  12/14/20-Samuel McDowell(Cardiology) 12/09/20-Gautam Irene Limbo (Oncology) 12/08/20-Jennifer Elisha Ponder [facility])  Hospital visits:  None in previous 6 months  Medications: Outpatient Encounter Medications as of 01/13/2021  Medication Sig Note  . acyclovir (ZOVIRAX) 400 MG tablet TAKE 1 TABLET TWICE DAILY   . albuterol (VENTOLIN HFA) 108 (90 Base) MCG/ACT inhaler Inhale 2 puffs into the lungs every 6 (six) hours as needed for wheezing or shortness of breath.   Marland Kitchen amitriptyline (ELAVIL) 50 MG tablet TAKE 1 TABLET AT BEDTIME   . ASCORBIC ACID PO Take 1 tablet by mouth daily.    Marland Kitchen aspirin EC 81 MG tablet Take 81 mg by mouth daily.   . Blood Glucose Monitoring Suppl (TRUE METRIX AIR GLUCOSE METER) w/Device KIT    . busPIRone (BUSPAR) 7.5 MG tablet Take 1 tablet (7.5 mg total) by mouth 2 (two) times daily as needed. for anxiety   . carvedilol (COREG) 6.25 MG tablet TAKE 1 TABLET TWICE DAILY (DOSE INCREASE)   . Cholecalciferol 1000 UNITS capsule Take 1 capsule (1,000 Units total) by mouth 2 (two) times daily.   . clotrimazole (LOTRIMIN) 1 % cream For athlete's feet, apply to both feet and between toes twice daily for 4 weeks.   . clotrimazole-betamethasone (LOTRISONE) cream Apply 1 application topically 2 (two) times daily. For 7 days maximum for severe jock itch . Stop if worsening symptoms. 12/03/2019: Unknown start date   . escitalopram (LEXAPRO) 5 MG tablet Take 1 tablet (5 mg total) by mouth daily.   . famotidine (PEPCID) 20 MG tablet TAKE 1 TABLET TWICE DAILY   . finasteride (PROSCAR) 5 MG tablet TAKE 1 TABLET EVERY DAY   . furosemide (LASIX) 20 MG tablet TAKE 1 TABLET (20 MG TOTAL) BY MOUTH DAILY.   Marland Kitchen gabapentin (NEURONTIN) 300 MG capsule Take 1 capsule (300 mg total)  by mouth 2 (two) times daily.   Marland Kitchen glucose blood (TRUE METRIX BLOOD GLUCOSE TEST) test strip TEST BLOOD SUGAR EVERY DAY   . metFORMIN (GLUCOPHAGE) 500 MG tablet Take 1 tablet (500 mg total) by mouth 3 (three) times daily with meals.   . Multiple Vitamin (MULTIVITAMIN) capsule Take 1 capsule by mouth daily.   . mupirocin ointment (BACTROBAN) 2 % Apply to right foot ulcer once daily and cover with dressing.   . NONFORMULARY OR COMPOUNDED ITEM [facility]  Peripheral Neuropathy Cream- Bupivacaine 1%, Doxepin 3%, Gabapentin 6%, Pentoxifylline 3%, Topiramate 1% Apply 1-2 grams to affected area 3-4 times daily Qty. 120 gm 3 refills 08/03/2020: As needed  . omeprazole (PRILOSEC) 20 MG capsule TAKE 1 CAPSULE DAILY 30 TO 60 MINUTES BEFORE FIRST MEAL OF THE DAY   . rosuvastatin (CRESTOR) 10 MG tablet TAKE 1 TABLET EVERY DAY (NEED MD APPOINTMENT FOR REFILLS)   . sacubitril-valsartan (ENTRESTO) 49-51 MG Take 1 tablet by mouth 2 (two) times daily.   . sodium chloride (MURO 128) 5 % ophthalmic solution Place 1 drop into the left eye at bedtime.    . tamsulosin (FLOMAX) 0.4 MG CAPS capsule TAKE 1 CAPSULE EVERY DAY   . TRUEPLUS LANCETS 28G MISC Use to test blood sugars two times daily. Dx: E11.9    No facility-administered encounter medications on file as of 01/13/2021.    Recent Relevant Labs: Lab Results  Component Value Date/Time  HGBA1C 6.5 (A) 12/15/2020 02:54 PM   HGBA1C 5.9 (A) 02/25/2020 01:37 PM   HGBA1C 7.1 (H) 10/22/2018 02:51 PM   HGBA1C 7.7 (H) 09/28/2017 02:14 PM   MICROALBUR 60.3 (H) 01/29/2018 12:52 PM   MICROALBUR 7.8 (H) 12/01/2015 09:40 AM    Kidney Function Lab Results  Component Value Date/Time   CREATININE 1.37 (H) 12/02/2020 11:49 AM   CREATININE 1.21 10/12/2020 01:00 PM   GFR 66.66 02/05/2020 02:04 PM   GFRNONAA 52 (L) 12/02/2020 11:49 AM   GFRAA 59 (L) 06/09/2020 08:59 AM    . Current antihyperglycemic regimen:  o Metformin 500 mg Take 1 tablet 3 times  daily  . What recent interventions/DTPs have been made to improve glycemic control:  o None  . Have there been any recent hospitalizations or ED visits since last visit with CPP? No   . Patient  hypoglycemic symptoms, including   . Patient  hyperglycemic symptoms, including   . How often are you checking your blood sugar?   . What are your blood sugars ranging?  o Fasting:  o Before meals:  o After meals:  o Bedtime:   . During the week, how often does your blood glucose drop below 70?   Marland Kitchen Are you checking your feet daily/regularly?   Adherence Review: Is the patient currently on a STATIN medication? Yes Is the patient currently on ACE/ARB medication? No Does the patient have >5 day gap between last estimated fill dates? Yes   Unable to reach patient. Left voicemail with no return call, left voicemail x 3.   Star Rating Drugs: Metformin 500 mg last filled 07/03/19 90DS Rosuvastatin 10 mg last filled 10/29/2020 90DS  [facility] Clinical Pharmacist Assistant [phone]

## 2021-01-16 ENCOUNTER — Other Ambulatory Visit: Payer: Self-pay | Admitting: Family Medicine

## 2021-01-23 DIAGNOSIS — J449 Chronic obstructive pulmonary disease, unspecified: Secondary | ICD-10-CM | POA: Diagnosis not present

## 2021-01-23 DIAGNOSIS — J9611 Chronic respiratory failure with hypoxia: Secondary | ICD-10-CM | POA: Diagnosis not present

## 2021-02-01 ENCOUNTER — Other Ambulatory Visit: Payer: Self-pay | Admitting: Family Medicine

## 2021-02-22 DIAGNOSIS — J449 Chronic obstructive pulmonary disease, unspecified: Secondary | ICD-10-CM | POA: Diagnosis not present

## 2021-02-22 DIAGNOSIS — J9611 Chronic respiratory failure with hypoxia: Secondary | ICD-10-CM | POA: Diagnosis not present

## 2021-03-08 ENCOUNTER — Other Ambulatory Visit: Payer: Self-pay | Admitting: Family Medicine

## 2021-03-11 ENCOUNTER — Other Ambulatory Visit: Payer: Self-pay | Admitting: Family Medicine

## 2021-03-15 ENCOUNTER — Ambulatory Visit (INDEPENDENT_AMBULATORY_CARE_PROVIDER_SITE_OTHER): Payer: Medicare HMO

## 2021-03-15 DIAGNOSIS — E1142 Type 2 diabetes mellitus with diabetic polyneuropathy: Secondary | ICD-10-CM | POA: Diagnosis not present

## 2021-03-15 DIAGNOSIS — I10 Essential (primary) hypertension: Secondary | ICD-10-CM | POA: Diagnosis not present

## 2021-03-15 NOTE — Progress Notes (Signed)
Chronic Care Management Pharmacy Note  03/15/2021 Name:  [name] MRN:  [id number] DOB:  01/04/1941  Recommendations/Changes made from today's visit: no Rx changes  Subjective: [name] is an 80 y.o. year old male who is a primary patient of Hunter, Brayton Mars, MD.  The CCM team was consulted for assistance with disease management and care coordination needs.    Engaged with patient by telephone for follow up visit in response to provider referral for pharmacy case management and/or care coordination services.   Consent to Services:  The patient was given information about Chronic Care Management services, agreed to services, and gave verbal consent prior to initiation of services.  Please see initial visit note for detailed documentation.   Patient Care Team: [name], MD as PCP - General (Family Medicine) Satira Sark, MD as PCP - Cardiology (Cardiology) Marilynne Halsted, MD as Referring Physician (Ophthalmology) Brunetta Genera, MD as Consulting Physician (Hematology) Pyrtle, Lajuan Lines, MD as Consulting Physician (Gastroenterology) Marzetta Board, DPM as Consulting Physician (Podiatry) Jonnie Finner, RN as Oncology Nurse Navigator Madelin Rear, [facility] as Pharmacist (Pharmacist)  Objective:  Lab Results  Component Value Date   CREATININE 1.37 (H) 12/02/2020   CREATININE 1.21 10/12/2020   CREATININE 1.33 (H) 08/12/2020    Lab Results  Component Value Date   HGBA1C 6.5 (A) 12/15/2020   Last diabetic Eye exam:  Lab Results  Component Value Date/Time   HMDIABEYEEXA No Retinopathy 01/24/2018 12:00 AM    Last diabetic Foot exam: No results found for: HMDIABFOOTEX      Component Value Date/Time   CHOL 120 11/07/2019 1146   TRIG 126.0 11/07/2019 1146   HDL 45.60 11/07/2019 1146   CHOLHDL 3 11/07/2019 1146   VLDL 25.2 11/07/2019 1146   LDLCALC 49 11/07/2019 1146   LDLDIRECT 57.0 10/22/2018 1451    Hepatic Function Latest Ref Rng &  Units 12/02/2020 10/12/2020 08/10/2020  Total Protein 6.5 - 8.1 g/dL 6.4(L) 6.9 6.9  Albumin 3.5 - 5.0 g/dL 3.5 3.7 3.8  AST 15 - 41 U/L '28 25 23  ' ALT 0 - 44 U/L '14 15 17  ' Alk Phosphatase 38 - 126 U/L 115 100 100  Total Bilirubin 0.3 - 1.2 mg/dL 0.6 0.7 0.7  Bilirubin, Direct 0.0 - 0.2 mg/dL - - -    Lab Results  Component Value Date/Time   TSH 4.225 12/04/2019 03:12 AM   TSH 0.824 02/13/2018 03:50 PM   TSH 2.67 12/01/2015 09:40 AM    CBC Latest Ref Rng & Units 12/02/2020 10/12/2020 08/10/2020  WBC 4.0 - 10.5 K/uL 3.6(L) 4.8 6.3  Hemoglobin 13.0 - 17.0 g/dL 10.7(L) 11.5(L) 11.4(L)  Hematocrit 39.0 - 52.0 % 32.5(L) 34.5(L) 33.7(L)  Platelets 150 - 400 K/uL 68(L) 55(L) 88(L)    Lab Results  Component Value Date/Time   VD25OH 39 08/23/2011 08:32 AM    Clinical ASCVD:  The ASCVD Risk score Joshua Bussing DC Jr., et al., 2013) failed to calculate for the following reasons:   The 2013 ASCVD risk score is only valid for ages 9 to 54    Other: (CHADS2VASc if Afib, PHQ9 if depression, MMRC or CAT for COPD, ACT, DEXA)  Social History   Tobacco Use  Smoking Status Former Smoker  . Packs/day: 4.00  . Years: 20.00  . Pack years: 80.00  . Types: Cigarettes  . Quit date: 09/18/1984  . Years since quitting: 36.5  Smokeless Tobacco Never Used  Tobacco Comment  quit in 1980   BP Readings from Last 3 Encounters:  12/15/20 136/74  12/14/20 120/70  12/09/20 137/60   Pulse Readings from Last 3 Encounters:  12/15/20 72  12/14/20 72  12/09/20 73   Wt Readings from Last 3 Encounters:  12/15/20 208 lb 6.4 oz (94.5 kg)  12/14/20 209 lb (94.8 kg)  12/09/20 210 lb 8 oz (95.5 kg)    Assessment: Review of patient past medical history, allergies, medications, health status, including review of consultants reports, laboratory and other test data, was performed as part of comprehensive evaluation and provision of chronic care management services.   SDOH:  (Social Determinants of Health) assessments  and interventions performed: Yes   CCM Care Plan  No Known Allergies  Medications Reviewed Today    Reviewed by Madelin Rear, [facility] (Pharmacist) on 03/15/21 at 1609  Med List Status: <None>  Medication Order Taking? Sig Documenting Provider Last Dose Status Informant  acyclovir (ZOVIRAX) 400 MG tablet [id number] No TAKE 1 TABLET TWICE DAILY Hunter, Brayton Mars, MD Taking Active   albuterol (VENTOLIN HFA) 108 (90 Base) MCG/ACT inhaler [id number] No Inhale 2 puffs into the lungs every 6 (six) hours as needed for wheezing or shortness of breath. [name], MD Taking Active Self  amitriptyline (ELAVIL) 50 MG tablet [id number]  TAKE 1 TABLET AT BEDTIME [name], MD  Active   ASCORBIC ACID PO [id number] No Take 1 tablet by mouth daily.  [provider] Taking Active Self  aspirin EC 81 MG tablet [id number] No Take 81 mg by mouth daily. [provider] Taking Active   Blood Glucose Monitoring Suppl (TRUE METRIX AIR GLUCOSE METER) w/Device KIT [id number] No  [provider] Taking Active Self  busPIRone (BUSPAR) 7.5 MG tablet [id number]  Take 1 tablet (7.5 mg total) by mouth 2 (two) times daily as needed. for anxiety [name], MD  Active   carvedilol (COREG) 6.25 MG tablet [id number] No TAKE 1 TABLET TWICE DAILY (DOSE INCREASE) Satira Sark, MD Taking Active   Cholecalciferol 1000 UNITS capsule [id number] No Take 1 capsule (1,000 Units total) by mouth 2 (two) times daily. Parrett, Fonnie Mu, NP Taking Active Self  clotrimazole (LOTRIMIN) 1 % cream [id number] No For athlete's feet, apply to both feet and between toes twice daily for 4 weeks. Marzetta Board, DPM Taking Active   clotrimazole-betamethasone (LOTRISONE) cream [id number] No Apply 1 application topically 2 (two) times daily. For 7 days maximum for severe jock itch . Stop if worsening symptoms. [name], MD Taking Active Self           Med Note Nickola Major, Rhea Bleacher Dec 03, 2019   5:48 PM) Unknown start date   escitalopram (LEXAPRO) 5 MG tablet [id number]  TAKE 1 TABLET EVERY DAY [name], MD  Active   famotidine (PEPCID) 20 MG tablet [id number]  TAKE 1 TABLET TWICE DAILY [name], MD  Active   finasteride (PROSCAR) 5 MG tablet [id number]  TAKE 1 TABLET EVERY DAY [name], MD  Active   furosemide (LASIX) 20 MG tablet [id number]  TAKE 1 TABLET (20 MG TOTAL) BY MOUTH DAILY. [name], MD  Expired 02/03/21 2359   gabapentin (NEURONTIN) 300 MG capsule [id number]  TAKE 1 CAPSULE TWICE DAILY [name], MD  Active   glucose blood (TRUE METRIX BLOOD GLUCOSE TEST) test strip [id number] No TEST BLOOD SUGAR EVERY DAY [name], MD Taking Active Self  metFORMIN (GLUCOPHAGE) 500 MG tablet [id number]  Take 1 tablet (500 mg total) by mouth 3 (three) times daily with meals. [name], MD  Active   Multiple Vitamin (MULTIVITAMIN) capsule [id number] No Take 1 capsule by mouth daily. [provider] Taking Active Self  mupirocin ointment (BACTROBAN) 2 % [id number] No Apply to right foot ulcer once daily and cover with dressing. Marzetta Board, DPM Taking Active   NONFORMULARY OR COMPOUNDED ITEM [id number] No [facility]  Peripheral Neuropathy Cream- Bupivacaine 1%, Doxepin 3%, Gabapentin 6%, Pentoxifylline 3%, Topiramate 1% Apply 1-2 grams to affected area 3-4 times daily Qty. 120 gm 3 refills [provider] Taking Active Self           Med Note Lonie Peak Aug 03, 2020  1:12 PM) As needed  omeprazole (PRILOSEC) 20 MG capsule [id number]  TAKE 1 CAPSULE DAILY 30 TO 60 MINUTES BEFORE FIRST MEAL OF THE DAY [name], MD  Active   rosuvastatin (CRESTOR) 10 MG tablet [id number]  TAKE 1 TABLET EVERY DAY (NEED MD APPOINTMENT FOR REFILLS) [name], MD  Active   sacubitril-valsartan (ENTRESTO) 49-51 MG [id number] No Take 1 tablet by mouth 2 (two) times daily. Ahmed Prima [place] M, PA-C Taking  Active   sodium chloride (MURO 128) 5 % ophthalmic solution [id number] No Place 1 drop into the left eye at bedtime.  [provider] Taking Active Self  tamsulosin (FLOMAX) 0.4 MG CAPS capsule [id number]  TAKE 1 CAPSULE EVERY DAY [name], MD  Active   TRUEPLUS LANCETS 28G MISC [id number] No Use to test blood sugars two times daily. Dx: E11.9 [name], MD Taking Active Self          Patient Active Problem List   Diagnosis Date Noted  . Hepatocellular carcinoma ([place]) 06/01/2020  . Anxiety 02/25/2020  . 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[place]) 12/14/2019  . Aortic atherosclerosis ([place]) 12/14/2019  . Chronic systolic heart failure ([place]) 12/05/2019  . Diabetic ulcer of right midfoot associated with diabetes mellitus due to underlying condition, with fat layer exposed ([place]) 11/07/2019  . Anemia 03/05/2018  . GERD (gastroesophageal reflux disease) 09/30/2017  . 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 positive 03/29/2016  . Thrombocytopenia ([place]) 12/21/2015  . B12 deficiency 08/20/2015  . 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 Diabetic polyneuropathy associated with type 2 diabetes mellitus ([place]) 08/07/2015  . Diplopia 06/05/2015  . CKD (chronic kidney disease), stage III ([place]) 05/06/2015  . Fatty liver 11/04/2014  . 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 PCO (posterior capsular opacification) 06/19/2013  . 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 Pseudophakia of left eye 04/10/2013  . ILD (interstitial lung disease) ([place]) 07/05/2012  . Nuclear cataract 01/20/2012  . 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 COPD (chronic obstructive pulmonary disease) ([place]) 12/15/2009  . BPH (benign prostatic hyperplasia) 06/20/2007  . Depression 05/02/2007  . Chronic back pain. Off narcotics 06/05/17 due to negative UDS for opiates x2. Full history 06/12/14. Pain contract signed.  05/02/2007  . 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  Hyperlipidemia associated with type 2 diabetes mellitus ([place]) 04/06/2007  . Essential hypertension 04/06/2007  . Osteoarthritis 04/06/2007    Immunization History  Administered Date(s) Administered  . Influenza Split 09/07/2011, 07/30/2012  . Influenza Whole 10/10/2005, 08/31/2009, 07/10/2010  . Influenza, High Dose Seasonal PF 06/22/2016, 06/29/2017, 09/20/2018, 07/11/2019  . Influenza,inj,Quad PF,6+ Mos 07/01/2013, 06/20/2014  . Influenza-Unspecified 10/26/2015, 09/12/2018  . Meningococcal  Conjugate 07/24/2014  . Moderna Sars-Covid-2 Vaccination 10/22/2019, 12/02/2019  . Pneumococcal Conjugate-13 07/24/2014  . Pneumococcal Polysaccharide-23 08/31/2009  . Td 06/10/2006  . Zoster Recombinat (Shingrix) 12/11/2018, 05/03/2019    Conditions to be addressed/monitored: DMII HTN HLD CKD CHF COPD Depression Anxiety   Care Plan : ccm pharmacy care plan  Updates made by Madelin Rear, [facility] since 03/15/2021 12:00 AM    Problem: DMII HTN HLD CKD CHF COPD Depression Anxiety   Priority: High    Long-Range Goal: Disease Management   Start Date: 03/15/2021  Expected End Date: 03/15/2022  This Visit's Progress: On track  Priority: High  Note:   Current Barriers:  . Does not contact provider office for questions/concerns  Pharmacist Clinical Goal(s):  Marland Kitchen Patient will contact provider office for questions/concerns as evidenced notation of same in electronic health record through collaboration with PharmD and provider.   Interventions: . 1:1 collaboration with [name], MD regarding development and update of comprehensive plan of care as evidenced by provider attestation and co-signature . Inter-disciplinary care team collaboration (see longitudinal plan of care) . Comprehensive medication review performed; medication list updated in electronic medical record . No Rx Changes  Hypertension (BP goal <130/80) -Controlled -Current treatment: . Entresto 49-51 mg twice daily  . Carvedilol  6.25 mg twice daily  . Furosemide 20 mg once daily  -Current home readings: readings never high. Denies hypotensive/hypertensive symptoms -Educated on BP goals and benefits of medications for prevention of heart attack, stroke and kidney damage; -Counseled to monitor BP at home as directed, document, and provide log at future appointments -Recommended to continue current medication  Biggest complaint is Entresto cost and going into donut hole - previously sent to patient but misplaced, will send back out for patient to complete and provide to cardiology.  Diabetes (A1c goal <7%) -Controlled -Current medications: Marland Kitchen Metformin 500 mg three times daily meals -Current home glucose readings: 130s-150s - tests at random throughout day and at night. 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 Would like to do more but limited by fatigue.  -Reviewed side effects - tolerating well - previous/current GI symptoms likely not related to metformin use -Educated on A1c and blood sugar goals; -Counseled to check feet daily and get yearly eye exams -Counseled on diet and exercise extensively Recommended to continue current medication   Patient Goals/Self-Care Activities . Patient will:  - check glucose as directed, document, and provide at future appointments  Follow Up Plan: CPA to send PAP for entresto (previously lost), understands this goes through cardiology and ok with providing financial information such as SS statement Medication Assistance: Application for entresto  medication assistance program. in process.  Anticipated assistance start date TBD.  See plan of care for additional detail.   Patient's preferred pharmacy is:  [place], [place] [address] Phone: [phone] Fax: [phone]  Follow Up:  Patient agrees to Care Plan and  Follow-up.  Future Appointments  Date Time Provider [place]  03/19/2021  3:00 PM Marzetta Board, DPM [facility]  03/23/2021  1:00 PM [facility] 1 [facility] [place]  04/06/2021  9:15 AM [facility] None  04/13/2021  9:20 AM Brunetta Genera, MD [facility] None  05/14/2021  2:40 PM [name], MD [facility]  07/05/2021  1:40 PM Satira Sark, MD [facility] [place] [facility]  08/09/2021  1:00 PM [facility] [place], PharmD, CPP Clinical Pharmacist Practitioner  [facility]  [phone]

## 2021-03-16 ENCOUNTER — Telehealth: Payer: Self-pay

## 2021-03-17 DIAGNOSIS — Z961 Presence of intraocular lens: Secondary | ICD-10-CM | POA: Diagnosis not present

## 2021-03-17 DIAGNOSIS — H18522 Epithelial (juvenile) corneal dystrophy, left eye: Secondary | ICD-10-CM | POA: Diagnosis not present

## 2021-03-17 DIAGNOSIS — Z947 Corneal transplant status: Secondary | ICD-10-CM | POA: Diagnosis not present

## 2021-03-17 NOTE — Chronic Care Management (AMB) (Signed)
    Chronic Care Management Pharmacy Assistant   Name: [name]  MRN: [id number] DOB: 1941/09/09  Reason for Encounter: Patient Assistance Documentation  Medications: Outpatient Encounter Medications as of 03/16/2021  Medication Sig Note   acyclovir (ZOVIRAX) 400 MG tablet TAKE 1 TABLET TWICE DAILY    albuterol (VENTOLIN HFA) 108 (90 Base) MCG/ACT inhaler Inhale 2 puffs into the lungs every 6 (six) hours as needed for wheezing or shortness of breath.    amitriptyline (ELAVIL) 50 MG tablet TAKE 1 TABLET AT BEDTIME    ASCORBIC ACID PO Take 1 tablet by mouth daily.     aspirin EC 81 MG tablet Take 81 mg by mouth daily.    Blood Glucose Monitoring Suppl (TRUE METRIX AIR GLUCOSE METER) w/Device KIT     busPIRone (BUSPAR) 7.5 MG tablet Take 1 tablet (7.5 mg total) by mouth 2 (two) times daily as needed. for anxiety    carvedilol (COREG) 6.25 MG tablet TAKE 1 TABLET TWICE DAILY (DOSE INCREASE)    Cholecalciferol 1000 UNITS capsule Take 1 capsule (1,000 Units total) by mouth 2 (two) times daily.    clotrimazole (LOTRIMIN) 1 % cream For athlete's feet, apply to both feet and between toes twice daily for 4 weeks.    clotrimazole-betamethasone (LOTRISONE) cream Apply 1 application topically 2 (two) times daily. For 7 days maximum for severe jock itch . Stop if worsening symptoms. 12/03/2019: Unknown start date    escitalopram (LEXAPRO) 5 MG tablet TAKE 1 TABLET EVERY DAY    famotidine (PEPCID) 20 MG tablet TAKE 1 TABLET TWICE DAILY    finasteride (PROSCAR) 5 MG tablet TAKE 1 TABLET EVERY DAY    gabapentin (NEURONTIN) 300 MG capsule TAKE 1 CAPSULE TWICE DAILY    glucose blood (TRUE METRIX BLOOD GLUCOSE TEST) test strip TEST BLOOD SUGAR EVERY DAY    metFORMIN (GLUCOPHAGE) 500 MG tablet Take 1 tablet (500 mg total) by mouth 3 (three) times daily with meals.    Multiple Vitamin (MULTIVITAMIN) capsule Take 1 capsule by mouth daily.    mupirocin ointment (BACTROBAN) 2 % Apply to right foot ulcer once  daily and cover with dressing.    NONFORMULARY OR COMPOUNDED ITEM [facility]  Peripheral Neuropathy Cream- Bupivacaine 1%, Doxepin 3%, Gabapentin 6%, Pentoxifylline 3%, Topiramate 1% Apply 1-2 grams to affected area 3-4 times daily Qty. 120 gm 3 refills 08/03/2020: As needed   omeprazole (PRILOSEC) 20 MG capsule TAKE 1 CAPSULE DAILY 30 TO 60 MINUTES BEFORE FIRST MEAL OF THE DAY    rosuvastatin (CRESTOR) 10 MG tablet TAKE 1 TABLET EVERY DAY (NEED MD APPOINTMENT FOR REFILLS)    sacubitril-valsartan (ENTRESTO) 49-51 MG Take 1 tablet by mouth 2 (two) times daily.    sodium chloride (MURO 128) 5 % ophthalmic solution Place 1 drop into the left eye at bedtime.     tamsulosin (FLOMAX) 0.4 MG CAPS capsule TAKE 1 CAPSULE EVERY DAY    TRUEPLUS LANCETS 28G MISC Use to test blood sugars two times daily. Dx: E11.9    No facility-administered encounter medications on file as of 03/16/2021.    Prepared and reviewed patient assistance application for [facility] and left message for  patient stating that the patient assistance application was sent to his home address. He was notified that the application needs to be completed and returned to the physician's office at his earliest convenience.  Wilford Sports CPA, CMA

## 2021-03-19 ENCOUNTER — Other Ambulatory Visit: Payer: Self-pay

## 2021-03-19 ENCOUNTER — Ambulatory Visit (INDEPENDENT_AMBULATORY_CARE_PROVIDER_SITE_OTHER): Payer: Medicare HMO | Admitting: Podiatry

## 2021-03-19 DIAGNOSIS — M79675 Pain in left toe(s): Secondary | ICD-10-CM | POA: Diagnosis not present

## 2021-03-19 DIAGNOSIS — L84 Corns and callosities: Secondary | ICD-10-CM

## 2021-03-19 DIAGNOSIS — B351 Tinea unguium: Secondary | ICD-10-CM | POA: Diagnosis not present

## 2021-03-19 DIAGNOSIS — E1142 Type 2 diabetes mellitus with diabetic polyneuropathy: Secondary | ICD-10-CM | POA: Diagnosis not present

## 2021-03-19 DIAGNOSIS — M79674 Pain in right toe(s): Secondary | ICD-10-CM | POA: Diagnosis not present

## 2021-03-23 ENCOUNTER — Ambulatory Visit (HOSPITAL_COMMUNITY)
Admission: RE | Admit: 2021-03-23 | Discharge: 2021-03-23 | Disposition: A | Discharge status: Home / Self Care | Payer: Medicare HMO | Source: Ambulatory Visit | Attending: Hematology | Admitting: Hematology

## 2021-03-23 ENCOUNTER — Other Ambulatory Visit: Payer: Self-pay

## 2021-03-23 DIAGNOSIS — C220 Liver cell carcinoma: Secondary | ICD-10-CM | POA: Diagnosis not present

## 2021-03-23 DIAGNOSIS — K769 Liver disease, unspecified: Secondary | ICD-10-CM | POA: Diagnosis not present

## 2021-03-23 DIAGNOSIS — I700 Atherosclerosis of aorta: Secondary | ICD-10-CM | POA: Diagnosis not present

## 2021-03-23 DIAGNOSIS — Z9889 Other specified postprocedural states: Secondary | ICD-10-CM | POA: Diagnosis not present

## 2021-03-23 MED ORDER — GADOBUTROL 1 MMOL/ML IV SOLN
9.0000 mL | Freq: Once | INTRAVENOUS | Status: AC | PRN
  Administered 2021-03-23: 14:00:00 9 mL via INTRAVENOUS

## 2021-03-24 ENCOUNTER — Other Ambulatory Visit: Payer: Medicare HMO

## 2021-03-24 ENCOUNTER — Encounter: Payer: Self-pay | Admitting: Podiatry

## 2021-03-24 NOTE — Progress Notes (Signed)
  Subjective:  Patient ID: [name], male    DOB: 04/22/1941,  MRN: [id number]  [name] presents to clinic today for at risk foot care with history of diabetic neuropathy and callus(es) right foot and painful thick toenails that are difficult to trim. Painful toenails interfere with ambulation. Aggravating factors include wearing enclosed shoe gear. Pain is relieved with periodic professional debridement. Painful calluses are aggravated when weightbearing with and without shoegear. Pain is relieved with periodic professional debridement.  Patient's wife is present during today's visit. [name] voices no new pedal concerns on today's visit.  He is awaiting delivery of his new diabetic shoes.  Patient is waiting to undergo treatment for his liver cancer.  He states blood glucose was 148 mg/dl today.  PCP is Dr. [name] and last visit was 1-2 months ago.  No Known Allergies  Review of Systems: Negative except as noted in the HPI. Objective:   Constitutional [name] is a pleasant 80 y.o. Caucasian male, in NAD. AAO x 3.   Vascular Capillary refill time to digits immediate b/l. Palpable pedal pulses b/l LE. Pedal hair absent. Lower extremity skin temperature gradient within normal limits. No pain with calf compression b/l. No edema noted b/l lower extremities. No cyanosis or clubbing noted.  Neurologic Normal speech. Oriented to person, place, and time. Protective sensation diminished with 10g monofilament b/l.  Dermatologic Pedal skin with normal turgor, texture and tone bilaterally. No open wounds bilaterally. No interdigital macerations bilaterally. Toenails 1-5 b/l elongated, discolored, dystrophic, thickened, crumbly with subungual debris and tenderness to dorsal palpation. Hyperkeratotic lesion(s) submet head 1 right foot.  No erythema, no edema, no drainage, no fluctuance.  Orthopedic: Normal muscle strength 5/5 to all lower extremity muscle groups bilaterally.  No pain crepitus or joint limitation noted with ROM b/l. Hammertoe(s) noted to the 1-5 bilaterally.   Radiographs: None Assessment:   1. Pain due to onychomycosis of toenails of both feet   2. Callus   3. Diabetic peripheral neuropathy associated with type 2 diabetes mellitus ([place])    Plan:  Patient was evaluated and treated and all questions answered.  Onychomycosis with pain -Nails palliatively debridement as below -Educated on self-care  Procedure: Nail Debridement Rationale: Pain Type of Debridement: manual, sharp debridement. Instrumentation: Nail nipper, rotary burr. Number of Nails: 10 -Examined patient. -Continue diabetic foot care principles. -Patient to continue soft, supportive shoe gear daily. -Toenails 1-5 b/l were debrided in length and girth with sterile nail nippers and dremel without iatrogenic bleeding.  -Callus(es) submet head 1 right foot pared utilizing sterile scalpel blade without complication or incident. Total number debrided =1. -Patient to report any pedal injuries to medical professional immediately. -Patient/POA to call should there be question/concern in the interim.  Return in about 3 months (around 06/19/2021).  Marzetta Board, DPM

## 2021-03-25 DIAGNOSIS — J449 Chronic obstructive pulmonary disease, unspecified: Secondary | ICD-10-CM | POA: Diagnosis not present

## 2021-03-25 DIAGNOSIS — J9611 Chronic respiratory failure with hypoxia: Secondary | ICD-10-CM | POA: Diagnosis not present

## 2021-03-30 ENCOUNTER — Other Ambulatory Visit: Payer: Self-pay

## 2021-03-30 ENCOUNTER — Ambulatory Visit (INDEPENDENT_AMBULATORY_CARE_PROVIDER_SITE_OTHER): Payer: Medicare HMO | Admitting: *Deleted

## 2021-03-30 DIAGNOSIS — M2041 Other hammer toe(s) (acquired), right foot: Secondary | ICD-10-CM

## 2021-03-30 DIAGNOSIS — L84 Corns and callosities: Secondary | ICD-10-CM

## 2021-03-30 DIAGNOSIS — E1142 Type 2 diabetes mellitus with diabetic polyneuropathy: Secondary | ICD-10-CM

## 2021-03-30 DIAGNOSIS — M2042 Other hammer toe(s) (acquired), left foot: Secondary | ICD-10-CM

## 2021-03-30 NOTE — Progress Notes (Signed)
Patient presents to the office today for diabetic shoe and insole measuring.  Patient was measured with brannock device to determine size and width for 1 pair of extra depth shoes and foam casted for 3 pair of insoles.   Documentation of medical necessity will be sent to patient's treating diabetic doctor to verify and sign.   Patient's diabetic provider: Dr. [name]  Shoes and insoles will be ordered at that time and patient will be notified for an appointment for fitting when they arrive.   Shoe size (per patient): 11   Brannock measurement: RIGHT: 10.5 D, LEFT: 10 E  Patient shoe selection-   1st choice:   Orthofeet 465  2nd choice:  Orthofeet 410  Shoe size ordered: Men's 11 wide

## 2021-04-01 ENCOUNTER — Ambulatory Visit: Payer: Medicare HMO | Admitting: Hematology

## 2021-04-06 ENCOUNTER — Other Ambulatory Visit: Payer: Self-pay

## 2021-04-06 ENCOUNTER — Inpatient Hospital Stay: Payer: Medicare HMO | Attending: Hematology

## 2021-04-06 DIAGNOSIS — C220 Liver cell carcinoma: Secondary | ICD-10-CM | POA: Diagnosis not present

## 2021-04-06 LAB — CBC WITH DIFFERENTIAL/PLATELET
Abs Immature Granulocytes: 0.01 10*3/uL (ref 0.00–0.07)
Basophils Absolute: 0 10*3/uL (ref 0.0–0.1)
Basophils Relative: 0 %
Eosinophils Absolute: 0.1 10*3/uL (ref 0.0–0.5)
Eosinophils Relative: 2 %
HCT: 22.6 % — ABNORMAL LOW (ref 39.0–52.0)
Hemoglobin: 7.1 g/dL — ABNORMAL LOW (ref 13.0–17.0)
Immature Granulocytes: 0 %
Lymphocytes Relative: 24 %
Lymphs Abs: 0.8 10*3/uL (ref 0.7–4.0)
MCH: 30.5 pg (ref 26.0–34.0)
MCHC: 31.4 g/dL (ref 30.0–36.0)
MCV: 97 fL (ref 80.0–100.0)
Monocytes Absolute: 0.3 10*3/uL (ref 0.1–1.0)
Monocytes Relative: 10 %
Neutro Abs: 2.1 10*3/uL (ref 1.7–7.7)
Neutrophils Relative %: 64 %
Platelets: 76 10*3/uL — ABNORMAL LOW (ref 150–400)
RBC: 2.33 MIL/uL — ABNORMAL LOW (ref 4.22–5.81)
RDW: 17.7 % — ABNORMAL HIGH (ref 11.5–15.5)
WBC: 3.3 10*3/uL — ABNORMAL LOW (ref 4.0–10.5)
nRBC: 0 % (ref 0.0–0.2)

## 2021-04-06 LAB — CMP (CANCER CENTER ONLY)
ALT: 11 U/L (ref 0–44)
AST: 25 U/L (ref 15–41)
Albumin: 2.8 g/dL — ABNORMAL LOW (ref 3.5–5.0)
Alkaline Phosphatase: 109 U/L (ref 38–126)
Anion gap: 8 (ref 5–15)
BUN: 18 mg/dL (ref 8–23)
CO2: 23 mmol/L (ref 22–32)
Calcium: 8.6 mg/dL — ABNORMAL LOW (ref 8.9–10.3)
Chloride: 108 mmol/L (ref 98–111)
Creatinine: 1.15 mg/dL (ref 0.61–1.24)
GFR, Estimated: >60 mL/min (ref >60)
Glucose, Bld: 113 mg/dL — ABNORMAL HIGH (ref 70–99)
Potassium: 4.7 mmol/L (ref 3.5–5.1)
Sodium: 139 mmol/L (ref 135–145)
Total Bilirubin: 0.5 mg/dL (ref 0.3–1.2)
Total Protein: 5.9 g/dL — ABNORMAL LOW (ref 6.5–8.1)

## 2021-04-07 LAB — AFP TUMOR MARKER: AFP, Serum, Tumor Marker: 4.1 ng/mL (ref 0.0–8.4)

## 2021-04-12 NOTE — Progress Notes (Signed)
HEMATOLOGY/ONCOLOGY CLINIC NOTE  Date of Service: 04/13/2021  Patient Care Team: [name], MD as PCP - General (Family Medicine) Satira Sark, MD as PCP - Cardiology (Cardiology) Marilynne Halsted, MD as Referring Physician (Ophthalmology) Brunetta Genera, MD as Consulting Physician (Hematology) Pyrtle, Lajuan Lines, MD as Consulting Physician (Gastroenterology) Marzetta Board, DPM as Consulting Physician (Podiatry) Madelin Rear, [facility] as Pharmacist (Pharmacist)  CHIEF COMPLAINTS/PURPOSE OF CONSULTATION:  F/u for [name]  HISTORY OF PRESENTING ILLNESS:   [name] is a wonderful 80 y.o. 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 He is accompanied today by his wife. The pt reports that he is doing well overall.   The pt reports that he has had low energy levels from the past 3-4 years since he retired. Today he feels fatigued as well.  He has been taking Metformin for the last 10 years and has recently begun replacing Vitamin B12. He has neuropathy in his feet and has experienced some falls and uses a cane at night. He is also taking Finasteride  Most recent lab results (02/28/18) of CBC  is as follows: all values are WNL except for RBC at 3.42, HGB at 11.4, HCT at 33.5, Platelets at 127k. Testosterone 02/13/18 was lower at 185  On review of systems, pt reports neuropathy in his feet, some fatigue, stable back pain, some ankle swelling, and denies abdominal pains and any other symptoms.   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 We are joined today by his wife and daughter. The patient's last visit with [place] was on 12/09/2020. The pt reports  that he is doing well overall.  The pt reports that he has been feeling well in the last three months. He notes an occasional pain in his left lower back that is intermittent. The patient has had several recent falls, hurting his ankle and knee. He lost his balance and turned around too quick. He denies any blood loss or bloody/black stools. The pt notes he does have a hemorrhoid that bleeds to his knowledge, but this is not acute to him.  Of note since the patient's last visit, pt has had MR Abd w wo contrast ([id number]) on 03/23/2021, which revealed "1. Marked interval decrease in size of a treated lesion of the high anterior midline liver, with persistent rim arterial hyperenhancement but diminished internal enhancement. Findings are consistent with continued treatment response, however with evidence of persistent viable tumor. 2. Interval increase in size of a lesion in the lateral left lobe of the liver, hepatic segment III, measuring 1.6 x 1.5 cm, previously 1.2 x 1.2 cm. This now demonstrates clear arterial phase hyperenhancement and washout, and is consistent with hepatocellular carcinoma. LI-RADS category 5. 3. Status post cholecystectomy."  Lab results today 04/06/2021 of CBC w/diff and CMP is as follows: all values are WNL except for WBC of 3.3K, RBC of 2.33, Hgb of 7.1, HCT of 22.6, Glucose of 113, Calcium of 8.6, Total protein of 5.9, Albumin of 2.8.  04/06/2021 AFP Tumor Marker of 4.1.   On review of systems, pt reports recent falls, SOB, back pain, diarrhea and denies bleeding issues, bloody/black stools,  and any other symptoms.   MEDICAL HISTORY:  Past Medical History:  Diagnosis Date   Benign prostatic hypertrophy    CHF (congestive heart failure) (HCC)    a. EF 20-25% by echo in 11/2019   COPD (chronic obstructive pulmonary disease) ([place]) December 15, 2009   FEV1 2.30 (70%) ratio 63 no better with B2 and DLCO 18/6 (73%) corrects to 106%   Degenerative joint disease    Depression     Diverticulosis    Essential hypertension    ACE inhibitor cough   Fatty liver    Hyperlipidemia    Internal hemorrhoids    Low back pain    Otitis externa 07/30/2012   Splenomegaly    Type 2 diabetes mellitus (HCC)    Ulcer of foot (HCC)    Right foot    SURGICAL HISTORY: Past Surgical History:  Procedure Laterality Date   IR EMBO TUMOR ORGAN ISCHEMIA INFARCT Frazier GUIDE ROADMAPPING  06/01/2020   IR RADIOLOGIST EVAL & MGMT  04/28/2020   IR RADIOLOGIST EVAL & MGMT  07/15/2020   NO PAST SURGERIES  1980    SOCIAL HISTORY: Social History   Socioeconomic History   Marital status: Unknown    Spouse name: Not on file   Number of children: Not on file   Years of education: Not on file   Highest education level: Not on file  Occupational History   Occupation: retired    Comment: maintenence work  Tobacco Use   Smoking status: Former    Packs/day: 4.00    Years: 20.00    Pack years: 80.00    Types: Cigarettes    Quit date: 09/18/1984    Years since quitting: 36.5   Smokeless tobacco: Never   Tobacco comments:    quit in 1980  Vaping Use   Vaping Use: Never used  Substance and Sexual Activity   Alcohol use: No    Comment: 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 4 kids (2 sons) and 3 grandkids.    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            Social Determinants of Radio broadcast assistant Strain: Low Risk    Difficulty of Paying Living Expenses: Not hard at all  Food Insecurity: No Food Insecurity   Worried About Charity fundraiser in the Last Year: Never true   Arboriculturist in the Last Year: Never true  Transportation Needs: No Transportation Needs   Lack of Transportation (Medical): No   Lack of  Transportation (Non-Medical): No  Physical Activity: Inactive   Days of Exercise per Week: 0 days   Minutes of Exercise per Session: 0 min  Stress: No Stress Concern Present   Feeling of Stress : Not at all  Social Connections: Moderately Integrated   Frequency of Communication with Friends and Family: More than three times a week   Frequency of Social Gatherings with Friends and Family: Twice a week   Attends Religious Services: More than 4 times per year   Active Member of [employer] or Organizations: No   Attends Archivist  Meetings: Never   Marital Status: Married  Human resources officer Violence: Not At Risk   Fear of Current or Ex-Partner: No   Emotionally Abused: No   Physically Abused: No   Sexually Abused: No    FAMILY HISTORY: Family History  Problem Relation Age of Onset   Melanoma Mother    Stroke Father    CAD Brother        Premature disease    ALLERGIES:  has No Known Allergies.  MEDICATIONS:  Current Outpatient Medications  Medication Sig Dispense Refill   acyclovir (ZOVIRAX) 400 MG tablet TAKE 1 TABLET TWICE DAILY 180 tablet 1   albuterol (VENTOLIN HFA) 108 (90 Base) MCG/ACT inhaler Inhale 2 puffs into the lungs every 6 (six) hours as needed for wheezing or shortness of breath. 6.7 g 3   amitriptyline (ELAVIL) 50 MG tablet TAKE 1 TABLET AT BEDTIME 90 tablet 1   ASCORBIC ACID PO Take 1 tablet by mouth daily.      aspirin EC 81 MG tablet Take 81 mg by mouth daily.     Blood Glucose Monitoring Suppl (TRUE METRIX AIR GLUCOSE METER) w/Device KIT      busPIRone (BUSPAR) 7.5 MG tablet Take 1 tablet (7.5 mg total) by mouth 2 (two) times daily as needed. for anxiety 180 tablet 3   carvedilol (COREG) 6.25 MG tablet TAKE 1 TABLET TWICE DAILY (DOSE INCREASE) 180 tablet 3   Cholecalciferol 1000 UNITS capsule Take 1 capsule (1,000 Units total) by mouth 2 (two) times daily.     clotrimazole (LOTRIMIN) 1 % cream For athlete's feet, apply to both feet and between toes twice  daily for 4 weeks. 45 g 1   clotrimazole-betamethasone (LOTRISONE) cream Apply 1 application topically 2 (two) times daily. For 7 days maximum for severe jock itch . Stop if worsening symptoms. 45 g 1   escitalopram (LEXAPRO) 5 MG tablet TAKE 1 TABLET EVERY DAY 90 tablet 5   famotidine (PEPCID) 20 MG tablet TAKE 1 TABLET TWICE DAILY 180 tablet 1   finasteride (PROSCAR) 5 MG tablet TAKE 1 TABLET EVERY DAY 90 tablet 1   furosemide (LASIX) 20 MG tablet TAKE 1 TABLET (20 MG TOTAL) BY MOUTH DAILY. 90 tablet 1   gabapentin (NEURONTIN) 300 MG capsule TAKE 1 CAPSULE TWICE DAILY 180 capsule 3   glucose blood (TRUE METRIX BLOOD GLUCOSE TEST) test strip TEST BLOOD SUGAR EVERY DAY 100 strip 1   metFORMIN (GLUCOPHAGE) 500 MG tablet Take 1 tablet (500 mg total) by mouth 3 (three) times daily with meals. 270 tablet 3   Multiple Vitamin (MULTIVITAMIN) capsule Take 1 capsule by mouth daily.     mupirocin ointment (BACTROBAN) 2 % Apply to right foot ulcer once daily and cover with dressing. 30 g 1   NONFORMULARY OR COMPOUNDED ITEM [facility]  Peripheral Neuropathy Cream- Bupivacaine 1%, Doxepin 3%, Gabapentin 6%, Pentoxifylline 3%, Topiramate 1% Apply 1-2 grams to affected area 3-4 times daily Qty. 120 gm 3 refills     omeprazole (PRILOSEC) 20 MG capsule TAKE 1 CAPSULE DAILY 30 TO 60 MINUTES BEFORE FIRST MEAL OF THE DAY 90 capsule 1   rosuvastatin (CRESTOR) 10 MG tablet TAKE 1 TABLET EVERY DAY (NEED MD APPOINTMENT FOR REFILLS) 90 tablet 0   sacubitril-valsartan (ENTRESTO) 49-51 MG Take 1 tablet by mouth 2 (two) times daily. 180 tablet 3   sodium chloride (MURO 128) 5 % ophthalmic solution Place 1 drop into the left eye at bedtime.      tamsulosin (FLOMAX)  0.4 MG CAPS capsule TAKE 1 CAPSULE EVERY DAY 90 capsule 1   TRUEPLUS LANCETS 28G MISC Use to test blood sugars two times daily. Dx: E11.9 200 each 3   No current facility-administered medications for this visit.    REVIEW OF SYSTEMS:   10 Point  review of Systems was done is negative except as noted above.  PHYSICAL EXAMINATION:  Vitals:   04/13/21 1323  BP: (!) 156/90  Pulse: 89  Resp: 17  Temp: 98 F (36.7 C)  SpO2: 98%    Filed Weights   04/13/21 1323  Weight: 210 lb 6.4 oz (95.4 kg)    .Body mass index is 28.54 kg/m.  Exam was given in a chair.   GENERAL:alert, in no acute distress and comfortable SKIN: no acute rashes, no significant lesions EYES: conjunctiva are pink and non-injected, sclera anicteric OROPHARYNX: MMM, no exudates, no oropharyngeal erythema or ulceration NECK: supple, no JVD LYMPH:  no palpable lymphadenopathy in the cervical, axillary or inguinal regions LUNGS: clear to auscultation b/l with normal respiratory effort HEART: regular rate & rhythm ABDOMEN:  normoactive bowel sounds , non tender, not distended. Extremity: no pedal edema PSYCH: alert & oriented x 3 with fluent speech NEURO: no focal motor/sensory deficits  LABORATORY DATA:  I have reviewed the data as listed  CBC Latest Ref Rng & Units 04/06/2021 12/02/2020 10/12/2020  WBC 4.0 - 10.5 K/uL 3.3(L) 3.6(L) 4.8  Hemoglobin 13.0 - 17.0 g/dL 7.1(L) 10.7(L) 11.5(L)  Hematocrit 39.0 - 52.0 % 22.6(L) 32.5(L) 34.5(L)  Platelets 150 - 400 K/uL 76(L) 68(L) 55(L)   CBC    Component Value Date/Time   WBC 3.3 (L) 04/06/2021 0857   RBC 2.33 (L) 04/06/2021 0857   HGB 7.1 (L) 04/06/2021 0857   HGB 11.5 (L) 10/12/2020 1300   HCT 22.6 (L) 04/06/2021 0857   HCT 33.1 (L) 06/21/2018 1332   PLT 76 (L) 04/06/2021 0857   PLT 55 (L) 10/12/2020 1300   MCV 97.0 04/06/2021 0857   MCH 30.5 04/06/2021 0857   MCHC 31.4 04/06/2021 0857   RDW 17.7 (H) 04/06/2021 0857   LYMPHSABS 0.8 04/06/2021 0857   MONOABS 0.3 04/06/2021 0857   EOSABS 0.1 04/06/2021 0857   BASOSABS 0.0 04/06/2021 0857    CMP Latest Ref Rng & Units 04/06/2021 12/02/2020 10/12/2020  Glucose 70 - 99 mg/dL 113(H) 181(H) 163(H)  BUN 8 - 23 mg/dL '18 19 17  ' Creatinine 0.61 - 1.24  mg/dL 1.15 1.37(H) 1.21  Sodium 135 - 145 mmol/L 139 138 141  Potassium 3.5 - 5.1 mmol/L 4.7 5.0 4.6  Chloride 98 - 111 mmol/L 108 105 108  CO2 22 - 32 mmol/L '23 26 29  ' Calcium 8.9 - 10.3 mg/dL 8.6(L) 8.9 9.4  Total Protein 6.5 - 8.1 g/dL 5.9(L) 6.4(L) 6.9  Total Bilirubin 0.3 - 1.2 mg/dL 0.5 0.6 0.7  Alkaline Phos 38 - 126 U/L 109 115 100  AST 15 - 41 U/L '25 28 25  ' ALT 0 - 44 U/L '11 14 15   ' Component     Latest Ref Rng & Units 06/21/2018  Folate, Hemolysate     Not Estab. ng/mL >620.0  HCT     37.5 - 51.0 % 33.1 (L)  Folate, RBC     >498 ng/mL >1,873  Methylmalonic Acid, Quantitative     0 - 378 nmol/L 294  DISCLAIMER      Comment  Specimen Type      [place]  Lead-Whole Blood  0 - 4 ug/dL 2  Vitamin B12     180 - 914 pg/mL 836  Homocysteine     0.0 - 15.0 umol/L 11.2    02/28/18 Surgical Pathology:   02/28/18 Cytogenetics:    RADIOGRAPHIC STUDIES: I have personally reviewed the radiological images as listed and agreed with the findings in the report. MR Abdomen W Wo Contrast  Result Date: 03/24/2021 CLINICAL DATA:  Hepatocellular carcinoma, assess treatment response, status post SBRT EXAM: MRI ABDOMEN WITHOUT AND WITH CONTRAST TECHNIQUE: Multiplanar multisequence MR imaging of the abdomen was performed both before and after the administration of intravenous contrast. CONTRAST:  41m GADAVIST GADOBUTROL 1 MMOL/ML IV SOLN COMPARISON:  12/02/2020 FINDINGS: Lower chest: No acute findings. Hepatobiliary: There is a redemonstrated lesion of the high anterior midline liver, previously described as a left lobe mass, which is substantially decreased in size compared to prior examination, measuring 2.8 x 2.2 cm, previously 3.6 x 2.9 cm, with persistent rim arterial hyperenhancement but diminished internal enhancement. There is adjacent fibrosis of the liver parenchyma in keeping with radiation therapy. There is an arterially hyperenhancing lesion in the lateral left lobe of the  liver, hepatic segment III, which demonstrates washout and is increased in size compared to prior examination, measuring 1.6 x 1.5 cm, previously 1.2 x 1.2 cm (series 16, image 40). Status post cholecystectomy. Postoperative biliary ductal dilatation. Pancreas: No mass, inflammatory changes, or other parenchymal abnormality identified. Spleen:  Within normal limits in size and appearance. Adrenals/Urinary Tract: No masses identified. No evidence of hydronephrosis. Stomach/Bowel: Visualized portions within the abdomen are unremarkable. Vascular/Lymphatic: No pathologically enlarged lymph nodes identified. No abdominal aortic aneurysm demonstrated. Aortic atherosclerosis. Other:  None. Musculoskeletal: No suspicious bone lesions identified. IMPRESSION: 1. Marked interval decrease in size of a treated lesion of the high anterior midline liver, with persistent rim arterial hyperenhancement but diminished internal enhancement. Findings are consistent with continued treatment response, however with evidence of persistent viable tumor. 2. Interval increase in size of a lesion in the lateral left lobe of the liver, hepatic segment III, measuring 1.6 x 1.5 cm, previously 1.2 x 1.2 cm. This now demonstrates clear arterial phase hyperenhancement and washout, and is consistent with hepatocellular carcinoma. LI-RADS category 5. 3. Status post cholecystectomy. Electronically Signed   By: AEddie CandleM.D.   On: 03/24/2021 08:39     ASSESSMENT & PLAN:   80y.o. male with  1. Thrombocytopenia  Mild Plt 127k with minimal normocytic anemia. 02/28/18 surgical pathology report with no overt pathology, could not rule out MDS, and noted an increase in ring sideroblasts 02/28/18 BM cytogenetics report was unremarkable  2. 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 2. 4 cm hypervascular mass in segment 4A of left hepatic lobe, and 8 mm hypervascular nodule in posterior right hepatic lobe. These are highly suspicious for hepatocellular carcinomas in the setting of cirrhosis. 3. No evidence of metastatic disease." -03/27/2020 MRI Abdomen ([id number] revealed "1. The dominant lesion of concern in the medial segment of the left hepatic lobe (segment 4 A) is diagnostic of hepatocellular carcinoma (LR 5). 2. The other small lesion questioned inferiorly in the right lobe is not clearly seen, although evaluation of this area is mildly limited by motion artifact."  PLAN: -Discussed pt labwork, 04/06/2021; severe anemia, tumor marker is normal, chemistries stable. -Discussed pt MR  Abd w wo contrast ([id number]) on 03/23/2021; main spot has shrunk a lot due to radiation and is stable. Other spot has increased from 1.2 cm to 1.6 cm. This is lighting up now and more concerned for another spot of liver cancer. -Advised pt we will get the opinion from radiation doctors regarding radiation of the other liver nodule. -Advised pt we are concerned he may be bleeding from the GI tract due to severe decrease in anemia down to 7. Fluid retention and liver cirrhosis would not dilute the blood that much to that low of a level. -Advised pt we will continue as current plan at this time.  -No lab or clinical evidence of Hepatocellular Carcinoma progression at this time. -Will get labs today and stool testing. -MRI Abd in 11 weeks. -Will see back in in 12 weeks   FOLLOW UP: Labs today  Refer to radiation Oncology for consideration of svrt new liver lesion concerning for recurrent hepatocellular carcinoma MRI Abd in 11 weeks RTC w Dr Irene Limbo w labs in 12 weeks Refer to Dr. Hilarie Fredrickson for Gi workup in setting of new anemia concerning for GI bleeding    The total time spent in the appointment was 30 minutes and more than 50% was on counseling and  direct patient cares.   All of the patient's questions were answered with apparent satisfaction. The patient knows to call the clinic with any problems, questions or concerns.   (Office):       [phone] (Work cell):  [phone] (Fax):           [phone]  04/13/2021 2:16 PM  I, Reinaldo Raddle, am acting as scribe for Dr. Sullivan Lone, MD.   .I have reviewed the above documentation for accuracy and completeness, and I agree with the above. Brunetta Genera MD    ADDENDUM  . Lab Results  Component Value Date   IRON 39 (L) 04/13/2021   TIBC 311 04/13/2021   IRONPCTSAT 12 (L) 04/13/2021   (Iron and TIBC)  Lab Results  Component Value Date   FERRITIN 27 04/13/2021   Plan -will schedule for IV Iron in addition to PRBC transfusion in light of iron deficiency and severe anemia.

## 2021-04-13 ENCOUNTER — Other Ambulatory Visit: Payer: Self-pay

## 2021-04-13 ENCOUNTER — Other Ambulatory Visit: Payer: Self-pay | Admitting: *Deleted

## 2021-04-13 ENCOUNTER — Inpatient Hospital Stay: Payer: Medicare HMO | Attending: Hematology | Admitting: Hematology

## 2021-04-13 ENCOUNTER — Inpatient Hospital Stay: Payer: Medicare HMO | Admitting: Hematology

## 2021-04-13 ENCOUNTER — Inpatient Hospital Stay: Payer: Medicare HMO

## 2021-04-13 VITALS — BP 156/90 | HR 89 | Temp 98.0°F | Resp 17 | Wt 210.4 lb

## 2021-04-13 DIAGNOSIS — C220 Liver cell carcinoma: Secondary | ICD-10-CM | POA: Insufficient documentation

## 2021-04-13 DIAGNOSIS — D500 Iron deficiency anemia secondary to blood loss (chronic): Secondary | ICD-10-CM | POA: Diagnosis not present

## 2021-04-13 DIAGNOSIS — J449 Chronic obstructive pulmonary disease, unspecified: Secondary | ICD-10-CM | POA: Diagnosis not present

## 2021-04-13 DIAGNOSIS — D649 Anemia, unspecified: Secondary | ICD-10-CM

## 2021-04-13 DIAGNOSIS — D696 Thrombocytopenia, unspecified: Secondary | ICD-10-CM | POA: Diagnosis not present

## 2021-04-13 DIAGNOSIS — Z79899 Other long term (current) drug therapy: Secondary | ICD-10-CM | POA: Insufficient documentation

## 2021-04-13 DIAGNOSIS — E538 Deficiency of other specified B group vitamins: Secondary | ICD-10-CM | POA: Insufficient documentation

## 2021-04-13 DIAGNOSIS — E1122 Type 2 diabetes mellitus with diabetic chronic kidney disease: Secondary | ICD-10-CM | POA: Diagnosis not present

## 2021-04-13 LAB — CBC WITH DIFFERENTIAL/PLATELET
Abs Immature Granulocytes: 0.01 10*3/uL (ref 0.00–0.07)
Basophils Absolute: 0 10*3/uL (ref 0.0–0.1)
Basophils Relative: 1 %
Eosinophils Absolute: 0.1 10*3/uL (ref 0.0–0.5)
Eosinophils Relative: 2 %
HCT: 25.8 % — ABNORMAL LOW (ref 39.0–52.0)
Hemoglobin: 7.8 g/dL — ABNORMAL LOW (ref 13.0–17.0)
Immature Granulocytes: 0 %
Lymphocytes Relative: 22 %
Lymphs Abs: 0.9 10*3/uL (ref 0.7–4.0)
MCH: 29.8 pg (ref 26.0–34.0)
MCHC: 30.2 g/dL (ref 30.0–36.0)
MCV: 98.5 fL (ref 80.0–100.0)
Monocytes Absolute: 0.4 10*3/uL (ref 0.1–1.0)
Monocytes Relative: 9 %
Neutro Abs: 2.7 10*3/uL (ref 1.7–7.7)
Neutrophils Relative %: 66 %
Platelets: 93 10*3/uL — ABNORMAL LOW (ref 150–400)
RBC: 2.62 MIL/uL — ABNORMAL LOW (ref 4.22–5.81)
RDW: 17.9 % — ABNORMAL HIGH (ref 11.5–15.5)
WBC: 4 10*3/uL (ref 4.0–10.5)
nRBC: 0 % (ref 0.0–0.2)

## 2021-04-13 LAB — SAMPLE TO BLOOD BANK

## 2021-04-13 LAB — PREPARE RBC (CROSSMATCH)

## 2021-04-13 NOTE — Progress Notes (Signed)
Verbal order per Dr. Irene Limbo: Transfuse 2 units PRBCs - scheduled for 10 am at [facility]. Pre meds: Tylenol 650 mg PO; Solumedrol 40 mg IV; Lasix 20 mg IV before each unit. Confirmed orders with Lattie Haw at [facility] Copy of "Pick up" slip faxed to [facility]: [phone] and faxed to [facility] [phone]. Fax confirmations received

## 2021-04-14 ENCOUNTER — Inpatient Hospital Stay: Payer: Medicare HMO

## 2021-04-14 DIAGNOSIS — D649 Anemia, unspecified: Secondary | ICD-10-CM | POA: Diagnosis not present

## 2021-04-14 DIAGNOSIS — E538 Deficiency of other specified B group vitamins: Secondary | ICD-10-CM | POA: Diagnosis not present

## 2021-04-14 DIAGNOSIS — Z79899 Other long term (current) drug therapy: Secondary | ICD-10-CM | POA: Diagnosis not present

## 2021-04-14 DIAGNOSIS — D696 Thrombocytopenia, unspecified: Secondary | ICD-10-CM | POA: Diagnosis not present

## 2021-04-14 DIAGNOSIS — C220 Liver cell carcinoma: Secondary | ICD-10-CM

## 2021-04-14 LAB — IRON AND TIBC
Iron: 39 ug/dL — ABNORMAL LOW (ref 42–163)
Saturation Ratios: 12 % — ABNORMAL LOW (ref 20–55)
TIBC: 311 ug/dL (ref 202–409)
UIBC: 273 ug/dL (ref 117–376)

## 2021-04-14 LAB — ABO/RH: ABO/RH(D): A NEG

## 2021-04-14 LAB — FERRITIN: Ferritin: 27 ng/mL (ref 24–336)

## 2021-04-14 MED ORDER — METHYLPREDNISOLONE SODIUM SUCC 40 MG IJ SOLR
INTRAMUSCULAR | Status: AC
  Filled 2021-04-14: qty 1

## 2021-04-14 MED ORDER — SODIUM CHLORIDE 0.9% IV SOLUTION
250.0000 mL | Freq: Once | INTRAVENOUS | Status: AC
  Administered 2021-04-14: 250 mL via INTRAVENOUS
  Filled 2021-04-14: qty 250

## 2021-04-14 MED ORDER — FUROSEMIDE 10 MG/ML IJ SOLN: 20.0000 mg | INTRAMUSCULAR | Status: DC

## 2021-04-14 MED ORDER — ACETAMINOPHEN 325 MG PO TABS: 650.0000 mg | ORAL_TABLET | Freq: Once | ORAL | Status: DC

## 2021-04-14 MED ORDER — METHYLPREDNISOLONE SODIUM SUCC 40 MG IJ SOLR
40.0000 mg | Freq: Once | INTRAMUSCULAR | Status: AC
  Administered 2021-04-14: 13:00:00 40 mg via INTRAVENOUS

## 2021-04-14 NOTE — Patient Instructions (Signed)
Blood Transfusion, Adult °A blood transfusion is a procedure in which you receive blood through an IV tube. You may need this procedure because of: °A bleeding disorder. °An illness. °An injury. °A surgery. °The blood may come from someone else (a donor). You may also be able to donate blood for yourself. The blood given in a transfusion is made up of different types of cells. You may get: °Red blood cells. These carry oxygen to the cells in the body. °White blood cells. These help you fight infections. °Platelets. These help your blood to clot. °Plasma. This is the liquid part of your blood. It carries proteins and other substances through the body. °If you have a clotting disorder, you may also get other types of blood products. °Tell your doctor about: °Any blood disorders you have. °Any reactions you have had during a blood transfusion in the past. °Any allergies you have. °All medicines you are taking, including vitamins, herbs, eye drops, creams, and over-the-counter medicines. °Any surgeries you have had. °Any medical conditions you have. This includes any recent fever or cold symptoms. °Whether you are pregnant or may be pregnant. °What are the risks? °Generally, this is a safe procedure. However, problems may occur. °The most common problems include: °A mild allergic reaction. This includes red, swollen areas of skin (hives) and itching. °Fever or chills. This may be the body's response to new blood cells received. This may happen during or up to 4 hours after the transfusion. °More serious problems may include: °Too much fluid in the lungs. This may cause breathing problems. °A serious allergic reaction. This includes breathing trouble or swelling around the face and lips. °Lung injury. This causes breathing trouble and low oxygen in the blood. This can happen within hours of the transfusion or days later. °Too much iron. This can happen after getting many blood transfusions over a period of time. °An  infection or virus passed through the blood. This is rare. Donated blood is carefully tested before it is given. °Your body's defense system (immune system) trying to attack the new blood cells. This is rare. Symptoms may include fever, chills, nausea, low blood pressure, and low back or chest pain. °Donated cells attacking healthy tissues. This is rare. °What happens before the procedure? °Medicines °Ask your doctor about: °Changing or stopping your normal medicines. This is important. °Taking aspirin and ibuprofen. Do not take these medicines unless your doctor tells you to take them. °Taking over-the-counter medicines, vitamins, herbs, and supplements. °General instructions °Follow instructions from your doctor about what you cannot eat or drink. °You will have a blood test to find out your blood type. The test also finds out what type of blood your body will accept and matches it to the donor type. °If you are going to have a planned surgery, you may be able to donate your own blood. This may be done in case you need a transfusion. °You will have your temperature, blood pressure, and pulse checked. °You may receive medicine to help prevent an allergic reaction. This may be done if you have had a reaction to a transfusion before. This medicine may be given to you by mouth or through an IV tube. °This procedure lasts about 1-4 hours. Plan for the time you need. °What happens during the procedure? ° °An IV tube will be put into one of your veins. °The bag of donated blood will be attached to your IV tube. Then, the blood will enter through your vein. °Your temperature,   blood pressure, and pulse will be checked often. This is done to find early signs of a transfusion reaction. °Tell your nurse right away if you have any of these symptoms: °Shortness of breath or trouble breathing. °Chest or back pain. °Fever or chills. °Red, swollen areas of skin or itching. °If you have any signs or symptoms of a reaction, your  transfusion will be stopped. You may also be given medicine. °When the transfusion is finished, your IV tube will be taken out. °Pressure may be put on the IV site for a few minutes. °A bandage (dressing) will be put on the IV site. °The procedure may vary among doctors and hospitals. °What happens after the procedure? °You will be monitored until you leave the hospital or clinic. This includes checking your temperature, blood pressure, pulse, breathing rate, and blood oxygen level. °Your blood may be tested to see how you are responding to the transfusion. °You may be warmed with fluids or blankets. This is done to keep the temperature of your body normal. °If you have your procedure in an outpatient setting, you will be told whom to contact to report any reactions. °Where to find more information °To learn more, visit the [employer]: redcross.org °Summary °A blood transfusion is a procedure in which you are given blood through an IV tube. °The blood may come from someone else (a donor). You may also be able to donate blood for yourself. °The blood you are given is made up of different blood cells. You may receive red blood cells, platelets, plasma, or white blood cells. °Your temperature, blood pressure, and pulse will be checked often. °After the procedure, your blood may be tested to see how you are responding. °This information is not intended to replace advice given to you by your health care provider. Make sure you discuss any questions you have with your health care provider. °Document Revised: 03/21/2019 Document Reviewed: 03/21/2019 °Elsevier Patient Education © 2022 [employer]. ° °

## 2021-04-15 ENCOUNTER — Telehealth: Payer: Self-pay | Admitting: Hematology

## 2021-04-15 LAB — BPAM RBC
Blood Product Expiration Date: 202208042359
Blood Product Expiration Date: 202208092359
ISSUE DATE / TIME: 202207061145
ISSUE DATE / TIME: 202207061145
Unit Type and Rh: 600
Unit Type and Rh: 600

## 2021-04-15 LAB — TYPE AND SCREEN
ABO/RH(D): A NEG
Antibody Screen: NEGATIVE
Unit division: 0
Unit division: 0

## 2021-04-15 NOTE — Telephone Encounter (Signed)
Scheduled follow-up appointment per 7/5 los. Patient is aware. 

## 2021-04-19 ENCOUNTER — Encounter: Payer: Self-pay | Admitting: Hematology

## 2021-04-19 DIAGNOSIS — D500 Iron deficiency anemia secondary to blood loss (chronic): Secondary | ICD-10-CM | POA: Insufficient documentation

## 2021-04-21 ENCOUNTER — Other Ambulatory Visit: Payer: Self-pay | Admitting: Student

## 2021-04-21 ENCOUNTER — Ambulatory Visit: Payer: Medicare HMO | Admitting: Hematology

## 2021-04-21 ENCOUNTER — Other Ambulatory Visit: Payer: Medicare HMO

## 2021-04-22 ENCOUNTER — Other Ambulatory Visit: Payer: Self-pay

## 2021-04-22 ENCOUNTER — Encounter: Payer: Self-pay | Admitting: Radiation Oncology

## 2021-04-22 ENCOUNTER — Ambulatory Visit
Admission: RE | Admit: 2021-04-22 | Discharge: 2021-04-22 | Disposition: A | Discharge status: Home / Self Care | Payer: Medicare HMO | Source: Ambulatory Visit | Attending: Radiation Oncology | Admitting: Radiation Oncology

## 2021-04-22 ENCOUNTER — Telehealth: Payer: Self-pay | Admitting: Hematology

## 2021-04-22 ENCOUNTER — Other Ambulatory Visit: Payer: Self-pay | Admitting: Hematology

## 2021-04-22 DIAGNOSIS — C220 Liver cell carcinoma: Secondary | ICD-10-CM | POA: Diagnosis not present

## 2021-04-22 NOTE — Progress Notes (Signed)
Contacted pt per Dr Grier Mitts direction:  let patient know he is noted to be significantly iron deficient and in light of significant anemia -- would recommend IV Injectafer -- schedule if patient agreeable. Also referred to GI Dr Hilarie Fredrickson for GI workup and rad onc for consideration of SBRT for recurrent HCC.   Pt agreeable to come in for IV iron. Pt states radiation treatment has already been set up. Pt will await GI appointment phone call. Pt verbalized understanding of information .

## 2021-04-22 NOTE — Telephone Encounter (Signed)
Scheduled appointment per 07/14 sch msg. Patient is aware. 

## 2021-04-22 NOTE — Progress Notes (Signed)
Spoke with patient via phone for consult for liver cancer. States he has not started chemotherapy states he does not want chemotherapy at this time.

## 2021-04-22 NOTE — Telephone Encounter (Signed)
This is a [facility] pt, Dr. McDowell 

## 2021-04-23 NOTE — Progress Notes (Signed)
Radiation Oncology         [phone] ________________________________  Outpatient Re Consultation - Conducted via telephone due to current COVID-19 concerns for limiting patient exposure  I spoke with the patient to conduct this consult visit via telephone to spare the patient unnecessary potential exposure in the healthcare setting during the current COVID-19 pandemic. The patient was notified in advance and was offered a [place] meeting to allow for face to face communication but unfortunately reported that they did not have the appropriate resources/technology to support such a visit and instead preferred to proceed with a telephone visit.   Name: [name]        MRN: [id number]  Date of Service: 04/22/2021 DOB: 1940/11/30  [id number], Joshua Mars, MD  Brunetta Genera, MD     REFERRING PHYSICIAN: Brunetta Genera, MD   DIAGNOSIS: The encounter diagnosis was Hepatocellular carcinoma [name]).   HISTORY OF PRESENT ILLNESS: [name] is a 80 y.o. 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 Following his diagnosis, he was seen by interventional radiology and it was felt that he would be a candidate for bland embolization but the procedure was aborted on 06/01/20 due to atherosclerotic plaque in the femoral/iliac vessels. Repeat MRI on 07/07/20 showed interval increase in the dominant lesion measuring 4.6 x 3.2 cm, and he proceeded with stereotactic body radiotherapy (SBRT) to the lesion.  He continues to be followed by Dr. Irene Limbo and recent imaging with MRI of the abdomen on 03/23/2021 showed an interval increase in the lesion in the third hepatic segment within the left lobe measuring up to 1.6 cm, previously 1.2 cm now demonstrating arterial phase hyperenhancement and washout consistent with hepatocellular carcinoma.  Interval decrease in the size of the high midline liver lesion that was treated with SBRT was consistent with treatment  response.  He is contacted today to discuss ablative therapies for the lesion in the left lobe that has increased in size.   PREVIOUS RADIATION THERAPY:    08/18/20-08/28/20 SBRT Treatment: The left liver target was treated to 60 Gy in 5 fractions   PAST MEDICAL HISTORY:  Past Medical History:  Diagnosis Date   Benign prostatic hypertrophy    CHF (congestive heart failure) (HCC)    a. EF 20-25% by echo in 11/2019   COPD (chronic obstructive pulmonary disease) ([place]) December 15, 2009   FEV1 2.30 (70%) ratio 63 no better with B2 and DLCO 18/6 (73%) corrects to 106%   Degenerative joint disease    Depression    Diverticulosis    Essential hypertension    ACE inhibitor cough   Fatty liver    Hyperlipidemia    Internal hemorrhoids    Low back pain    Otitis externa 07/30/2012   Splenomegaly    Type 2 diabetes mellitus ([place])    Ulcer of foot ([place])    Right foot       PAST SURGICAL HISTORY: Past Surgical History:  Procedure Laterality Date   IR EMBO TUMOR ORGAN ISCHEMIA INFARCT INC GUIDE ROADMAPPING  06/01/2020   IR RADIOLOGIST EVAL & MGMT  04/28/2020   IR RADIOLOGIST EVAL & MGMT  07/15/2020   NO PAST SURGERIES  1980     FAMILY HISTORY:  Family History  Problem Relation Age of Onset   Melanoma Mother    Stroke Father    CAD Brother        Premature disease  SOCIAL HISTORY:  reports that he quit smoking about 36 years ago. His smoking use included cigarettes. He has a 80.00 pack-year smoking history. He has never used smokeless tobacco. He reports that he does not drink alcohol and does not use drugs. The patient is married and lives in [place].    ALLERGIES: Patient has no known allergies.   MEDICATIONS:  Current Outpatient Medications  Medication Sig Dispense Refill   acyclovir (ZOVIRAX) 400 MG tablet TAKE 1 TABLET TWICE DAILY 180 tablet 1   albuterol (VENTOLIN HFA) 108 (90 Base) MCG/ACT inhaler Inhale 2 puffs into the lungs every 6 (six) hours as needed for  wheezing or shortness of breath. 6.7 g 3   amitriptyline (ELAVIL) 50 MG tablet TAKE 1 TABLET AT BEDTIME 90 tablet 1   ASCORBIC ACID PO Take 1 tablet by mouth daily.      aspirin EC 81 MG tablet Take 81 mg by mouth daily.     Blood Glucose Monitoring Suppl (TRUE METRIX AIR GLUCOSE METER) w/Device KIT      busPIRone (BUSPAR) 7.5 MG tablet Take 1 tablet (7.5 mg total) by mouth 2 (two) times daily as needed. for anxiety 180 tablet 3   carvedilol (COREG) 6.25 MG tablet TAKE 1 TABLET TWICE DAILY (DOSE INCREASE) 180 tablet 3   Cholecalciferol 1000 UNITS capsule Take 1 capsule (1,000 Units total) by mouth 2 (two) times daily.     clotrimazole (LOTRIMIN) 1 % cream For athlete's feet, apply to both feet and between toes twice daily for 4 weeks. 45 g 1   clotrimazole-betamethasone (LOTRISONE) cream Apply 1 application topically 2 (two) times daily. For 7 days maximum for severe jock itch . Stop if worsening symptoms. 45 g 1   ENTRESTO 49-51 MG TAKE 1 TABLET TWICE DAILY 180 tablet 3   escitalopram (LEXAPRO) 5 MG tablet TAKE 1 TABLET EVERY DAY 90 tablet 5   famotidine (PEPCID) 20 MG tablet TAKE 1 TABLET TWICE DAILY 180 tablet 1   finasteride (PROSCAR) 5 MG tablet TAKE 1 TABLET EVERY DAY 90 tablet 1   gabapentin (NEURONTIN) 300 MG capsule TAKE 1 CAPSULE TWICE DAILY 180 capsule 3   glucose blood (TRUE METRIX BLOOD GLUCOSE TEST) test strip TEST BLOOD SUGAR EVERY DAY 100 strip 1   metFORMIN (GLUCOPHAGE) 500 MG tablet Take 1 tablet (500 mg total) by mouth 3 (three) times daily with meals. 270 tablet 3   Multiple Vitamin (MULTIVITAMIN) capsule Take 1 capsule by mouth daily.     mupirocin ointment (BACTROBAN) 2 % Apply to right foot ulcer once daily and cover with dressing. 30 g 1   NONFORMULARY OR COMPOUNDED ITEM [facility]  Peripheral Neuropathy Cream- Bupivacaine 1%, Doxepin 3%, Gabapentin 6%, Pentoxifylline 3%, Topiramate 1% Apply 1-2 grams to affected area 3-4 times daily Qty. 120 gm 3 refills      omeprazole (PRILOSEC) 20 MG capsule TAKE 1 CAPSULE DAILY 30 TO 60 MINUTES BEFORE FIRST MEAL OF THE DAY 90 capsule 1   rosuvastatin (CRESTOR) 10 MG tablet TAKE 1 TABLET EVERY DAY (NEED MD APPOINTMENT FOR REFILLS) 90 tablet 0   sodium chloride (MURO 128) 5 % ophthalmic solution Place 1 drop into the left eye at bedtime.      tamsulosin (FLOMAX) 0.4 MG CAPS capsule TAKE 1 CAPSULE EVERY DAY 90 capsule 1   TRUEPLUS LANCETS 28G MISC Use to test blood sugars two times daily. Dx: E11.9 200 each 3   furosemide (LASIX) 20 MG tablet TAKE 1 TABLET (20 MG TOTAL)  BY MOUTH DAILY. 90 tablet 1   No current facility-administered medications for this encounter.     REVIEW OF SYSTEMS: On review of systems, he reports that he is doing extremely well, he denies any abdominal pain nausea or vomiting, he states he has not had any unintended weight changes fevers chills chest pain or shortness of breath.  No other complaints are verbalized.     PHYSICAL EXAM:  Unable to assess due to encounter.   ECOG = 0  0 - Asymptomatic (Fully active, able to carry on all predisease activities without restriction)  1 - Symptomatic but completely ambulatory (Restricted in physically strenuous activity but ambulatory and able to carry out work of a light or sedentary nature. For example, light housework, office work)  2 - Symptomatic, <50% in bed during the day (Ambulatory and capable of all self care but unable to carry out any work activities. Up and about more than 50% of waking hours)  3 - Symptomatic, >50% in bed, but not bedbound (Capable of only limited self-care, confined to bed or chair 50% or more of waking hours)  4 - Bedbound (Completely disabled. Cannot carry on any self-care. Totally confined to bed or chair)  5 - Death   Eustace Pen MM, Creech RH, Tormey DC, et al. [phone]). "Toxicity and response criteria of the [facility]". [place] On[place]. 5 (6): 649-55    LABORATORY DATA:  Lab  Results  Component Value Date   WBC 4.0 04/13/2021   HGB 7.8 (L) 04/13/2021   HCT 25.8 (L) 04/13/2021   MCV 98.5 04/13/2021   PLT 93 (L) 04/13/2021   Lab Results  Component Value Date   NA 139 04/06/2021   K 4.7 04/06/2021   CL 108 04/06/2021   CO2 23 04/06/2021   Lab Results  Component Value Date   ALT 11 04/06/2021   AST 25 04/06/2021   ALKPHOS 109 04/06/2021   BILITOT 0.5 04/06/2021      RADIOGRAPHY: No results found.      IMPRESSION/PLAN: 1. Hepatocellular Carcinoma.  Dr. Lisbeth Renshaw discusses the recent imaging findings and the newer changes in the lesion in a separate site in the left lobe of the liver.  He appears to be a good candidate for ablative therapy.  We have already reached out to interventional radiology who would approve microwave ablation versus Y 90.  We discussed with the patient the option to meet with interventional radiology to discuss this therapy.  He is hesitant to consider this given his previous experience where he was unable to have the procedure performed and favors proceeding with a similar course of radiation.  We discussed the need for fiducial marker placement followed by simulation prior to being able to treat him.  We reviewed the risks, benefits, short and long-term effects of radiotherapy the need for IV contrast prior to simulation and a course of 5 fractions of treatment.  The patient is interested in moving forward with stereotactic radiation.  We will coordinate with him once we know the date of his fiducial marker placement.   Given current concerns for patient exposure during the COVID-19 pandemic, this encounter was conducted via telephone.  The patient has provided two factor identification and has given verbal consent for this type of encounter and has been advised to only accept a meeting of this type in a secure network environment. The time spent during this encounter was 45 minutes including preparation, discussion, and coordination of  the patient's  care. The attendants for this meeting include Dr. Lisbeth Renshaw, Hayden Pedro  and [name].  During the encounter,  Dr. Lisbeth Renshaw, and Hayden Pedro were located at [facility] Radiation Oncology Department.  [name] was located at home.    The above documentation reflects my direct findings during this shared patient visit. Please see the separate note by Dr. Lisbeth Renshaw on this date for the remainder of the patient's plan of care.    Carola Rhine, PAC

## 2021-04-24 DIAGNOSIS — J9611 Chronic respiratory failure with hypoxia: Secondary | ICD-10-CM | POA: Diagnosis not present

## 2021-04-24 DIAGNOSIS — J449 Chronic obstructive pulmonary disease, unspecified: Secondary | ICD-10-CM | POA: Diagnosis not present

## 2021-04-27 ENCOUNTER — Encounter (HOSPITAL_COMMUNITY): Payer: Self-pay

## 2021-04-27 NOTE — Progress Notes (Unsigned)
       Patient Demographics  Patient Name  Tommie, Bohlken Legal Sex  Male DOB  12-01-1940 SSN  [id number] Address  [place] [address] Phone  [phone] (Home) *Preferred*  [phone] (Mobile)     RE: Biopsy Received: 4 days ago Arne Cleveland, MD  Ernestene Mention   US liver marker placement  Set up for possible [place] contrast, at [facility]   [facility]         Previous Messages    ----- Message -----  From: Lenore Cordia  Sent: 04/23/2021   3:04 PM EDT  To: Ir Procedure Requests  Subject: Biopsy                                         Procedure Requested: US Biopsy (Liver)    Reason for Procedure:Hepatocellular carcinoma . [place] fiducial marker placement prior to SBRT   Provider Requesting:Perkins, Rex Kras   Provider [phone]    Other Info:

## 2021-05-01 ENCOUNTER — Other Ambulatory Visit: Payer: Self-pay | Admitting: Hematology

## 2021-05-03 ENCOUNTER — Other Ambulatory Visit: Payer: Self-pay | Admitting: Hematology

## 2021-05-03 ENCOUNTER — Other Ambulatory Visit: Payer: Self-pay

## 2021-05-03 ENCOUNTER — Inpatient Hospital Stay: Payer: Medicare HMO

## 2021-05-03 VITALS — BP 131/80 | HR 76 | Temp 97.6°F | Resp 18

## 2021-05-03 DIAGNOSIS — D696 Thrombocytopenia, unspecified: Secondary | ICD-10-CM | POA: Diagnosis not present

## 2021-05-03 DIAGNOSIS — D500 Iron deficiency anemia secondary to blood loss (chronic): Secondary | ICD-10-CM

## 2021-05-03 DIAGNOSIS — D649 Anemia, unspecified: Secondary | ICD-10-CM | POA: Diagnosis not present

## 2021-05-03 DIAGNOSIS — Z79899 Other long term (current) drug therapy: Secondary | ICD-10-CM | POA: Diagnosis not present

## 2021-05-03 DIAGNOSIS — E538 Deficiency of other specified B group vitamins: Secondary | ICD-10-CM | POA: Diagnosis not present

## 2021-05-03 DIAGNOSIS — C220 Liver cell carcinoma: Secondary | ICD-10-CM | POA: Diagnosis not present

## 2021-05-03 MED ORDER — LORATADINE 10 MG PO TABS
10.0000 mg | ORAL_TABLET | Freq: Once | ORAL | Status: AC
  Administered 2021-05-03: 10 mg via ORAL

## 2021-05-03 MED ORDER — ACETAMINOPHEN 325 MG PO TABS
650.0000 mg | ORAL_TABLET | Freq: Once | ORAL | Status: AC
  Administered 2021-05-03: 650 mg via ORAL

## 2021-05-03 MED ORDER — LORATADINE 10 MG PO TABS
ORAL_TABLET | ORAL | Status: AC
  Filled 2021-05-03: qty 1

## 2021-05-03 MED ORDER — ACETAMINOPHEN 325 MG PO TABS
ORAL_TABLET | ORAL | Status: AC
  Filled 2021-05-03: qty 2

## 2021-05-03 MED ORDER — SODIUM CHLORIDE 0.9 % IV SOLN
300.0000 mg | Freq: Once | INTRAVENOUS | Status: AC
  Administered 2021-05-03: 300 mg via INTRAVENOUS
  Filled 2021-05-03: qty 300

## 2021-05-03 MED ORDER — SODIUM CHLORIDE 0.9 % IV SOLN
Freq: Once | INTRAVENOUS | Status: AC
  Filled 2021-05-03: qty 250

## 2021-05-03 NOTE — Patient Instructions (Signed)

## 2021-05-03 NOTE — Progress Notes (Signed)
Patient observed 30 min post iron infusion, discharged in stable condition

## 2021-05-04 ENCOUNTER — Telehealth: Payer: Self-pay | Admitting: Podiatry

## 2021-05-04 ENCOUNTER — Other Ambulatory Visit: Payer: Self-pay | Admitting: Radiation Oncology

## 2021-05-04 ENCOUNTER — Ambulatory Visit
Admission: RE | Admit: 2021-05-04 | Discharge: 2021-05-04 | Disposition: A | Discharge status: Home / Self Care | Payer: Medicare HMO | Source: Ambulatory Visit | Attending: Hematology | Admitting: Hematology

## 2021-05-04 ENCOUNTER — Encounter: Payer: Self-pay | Admitting: *Deleted

## 2021-05-04 ENCOUNTER — Telehealth: Payer: Self-pay | Admitting: *Deleted

## 2021-05-04 DIAGNOSIS — C220 Liver cell carcinoma: Secondary | ICD-10-CM

## 2021-05-04 HISTORY — PX: IR RADIOLOGIST EVAL & MGMT: IMG5224

## 2021-05-04 NOTE — Consult Note (Signed)
Chief Complaint: Patient was seen in consultation today for hepatocellular cancer at the request of Brunetta Genera  Referring Physician(s): Brunetta Genera  History of Present Illness: [name] is a 80 y.o. male with a history of hepatocellular carcinoma.  He was initially seen by my partner, Dr. Owens Shark for treatment of a right hepatic lesion.  However, intra-arterial therapy was not successful given occlusive disease in the iliofemoral arteries.  Subsequently, he was referred to Dr. Lisbeth Renshaw of radiation oncology and underwent SBRT therapy.  Now, he has a new lesion in the left hepatic lobe.  He had already made the decision to pursue repeat SBRT of this new lesion, however due to a scheduling issue our appointment today was not canceled.  He and his wife have come to the office and decided to undergo consultation and discuss other liver directed therapies.  Currently, he is doing very well and is essentially asymptomatic in regard to his tumor.  He has good energy and appetite.  He has no pain, nausea, vomiting or unintentional weight loss.  He reports that the SBRT therapy was quite easy on him and he had no adverse effects.  Past Medical History:  Diagnosis Date   Benign prostatic hypertrophy    CHF (congestive heart failure) ([place])    a. EF 20-25% by echo in 11/2019   COPD (chronic obstructive pulmonary disease) ([place]) December 15, 2009   FEV1 2.30 (70%) ratio 63 no better with B2 and DLCO 18/6 (73%) corrects to 106%   Degenerative joint disease    Depression    Diverticulosis    Essential hypertension    ACE inhibitor cough   Fatty liver    Hyperlipidemia    Internal hemorrhoids    Low back pain    Otitis externa 07/30/2012   Splenomegaly    Type 2 diabetes mellitus ([place])    Ulcer of foot ([place])    Right foot    Past Surgical History:  Procedure Laterality Date   IR EMBO TUMOR ORGAN ISCHEMIA INFARCT INC GUIDE ROADMAPPING  06/01/2020   IR RADIOLOGIST EVAL &  MGMT  04/28/2020   IR RADIOLOGIST EVAL & MGMT  07/15/2020   NO PAST SURGERIES  1980    Allergies: Patient has no known allergies.  Medications: Prior to Admission medications   Medication Sig Start Date End Date Taking? Authorizing Provider  acyclovir (ZOVIRAX) 400 MG tablet TAKE 1 TABLET TWICE DAILY 08/17/20   [name], MD  albuterol (VENTOLIN HFA) 108 (90 Base) MCG/ACT inhaler Inhale 2 puffs into the lungs every 6 (six) hours as needed for wheezing or shortness of breath. 11/29/19   [name], MD  amitriptyline (ELAVIL) 50 MG tablet TAKE 1 TABLET AT BEDTIME 01/04/21   [name], MD  ASCORBIC ACID PO Take 1 tablet by mouth daily.     [provider]  aspirin EC 81 MG tablet Take 81 mg by mouth daily.    [provider]  Blood Glucose Monitoring Suppl (TRUE METRIX AIR GLUCOSE METER) w/Device KIT  08/01/18   [provider]  busPIRone (BUSPAR) 7.5 MG tablet Take 1 tablet (7.5 mg total) by mouth 2 (two) times daily as needed. for anxiety 12/15/20   [name], MD  carvedilol (COREG) 6.25 MG tablet TAKE 1 TABLET TWICE DAILY (DOSE INCREASE) 11/25/20   Satira Sark, MD  Cholecalciferol 1000 UNITS capsule Take 1 capsule (1,000 Units total) by mouth 2 (two) times daily. 05/19/11  Parrett, Tammy S, NP  clotrimazole (LOTRIMIN) 1 % cream For athlete's feet, apply to both feet and between toes twice daily for 4 weeks. 05/15/20   Marzetta Board, DPM  clotrimazole-betamethasone (LOTRISONE) cream Apply 1 application topically 2 (two) times daily. For 7 days maximum for severe jock itch . Stop if worsening symptoms. 11/07/19   [name], MD  ENTRESTO 49-51 MG TAKE 1 TABLET TWICE DAILY 04/22/21   Satira Sark, MD  escitalopram [facility]) 5 MG tablet TAKE 1 TABLET EVERY DAY 02/01/21   [name], MD  famotidine (PEPCID) 20 MG tablet TAKE 1 TABLET TWICE DAILY 01/04/21   [name], MD  finasteride (PROSCAR) 5 MG tablet TAKE 1  TABLET EVERY DAY 01/04/21   [name], MD  furosemide (LASIX) 20 MG tablet TAKE 1 TABLET (20 MG TOTAL) BY MOUTH DAILY. 01/04/21 02/03/21  [name], MD  gabapentin (NEURONTIN) 300 MG capsule TAKE 1 CAPSULE TWICE DAILY 03/15/21   [name], MD  glucose blood (TRUE METRIX BLOOD GLUCOSE TEST) test strip TEST BLOOD SUGAR EVERY DAY 11/05/19   [name], MD  metFORMIN (GLUCOPHAGE) 500 MG tablet Take 1 tablet (500 mg total) by mouth 3 (three) times daily with meals. 12/15/20   [name], MD  Multiple Vitamin (MULTIVITAMIN) capsule Take 1 capsule by mouth daily.    [provider]  mupirocin ointment (BACTROBAN) 2 % Apply to right foot ulcer once daily and cover with dressing. 02/18/20   Marzetta Board, DPM  NONFORMULARY OR COMPOUNDED ITEM [facility]  Peripheral Neuropathy Cream- Bupivacaine 1%, Doxepin 3%, Gabapentin 6%, Pentoxifylline 3%, Topiramate 1% Apply 1-2 grams to affected area 3-4 times daily Qty. 120 gm 3 refills 02/20/17   [provider]  omeprazole (PRILOSEC) 20 MG capsule TAKE 1 CAPSULE DAILY 30 TO 60 MINUTES BEFORE FIRST MEAL OF THE DAY 01/04/21   [name], MD  rosuvastatin (CRESTOR) 10 MG tablet TAKE 1 TABLET EVERY DAY (NEED MD APPOINTMENT FOR REFILLS) 03/09/21   [name], MD  sodium chloride (MURO 128) 5 % ophthalmic solution Place 1 drop into the left eye at bedtime.     [provider]  tamsulosin (FLOMAX) 0.4 MG CAPS capsule TAKE 1 CAPSULE EVERY DAY 01/04/21   [name], MD  TRUEPLUS LANCETS 28G MISC Use to test blood sugars two times daily. Dx: E11.9 07/30/18   [name], MD     Family History  Problem Relation Age of Onset   Melanoma Mother    Stroke Father    CAD Brother        Premature disease    Social History   Socioeconomic History   Marital status: Unknown    Spouse name: Not on file   Number of children: Not on file   Years of education: Not on file   Highest  education level: Not on file  Occupational History   Occupation: retired    Comment: maintenence work  Tobacco Use   Smoking status: Former    Packs/day: 4.00    Years: 20.00    Pack years: 80.00    Types: Cigarettes    Quit date: 09/18/1984    Years since quitting: 36.6   Smokeless tobacco: Never   Tobacco comments:    quit in 1980  Vaping Use   Vaping Use: Never used  Substance and Sexual Activity   Alcohol use: No    Comment: no drinking 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 4 kids (2 sons) and 3 grandkids.    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            Social Determinants of Radio broadcast assistant Strain: Low Risk    Difficulty of Paying Living Expenses: Not hard at all  Food Insecurity: No Food Insecurity   Worried About Charity fundraiser in the Last Year: Never true   Arboriculturist in the Last Year: Never true  Transportation Needs: No Transportation Needs   Lack of Transportation (Medical): No   Lack of Transportation (Non-Medical): No  Physical Activity: Inactive   Days of Exercise per Week: 0 days   Minutes of Exercise per Session: 0 min  Stress: No Stress Concern Present   Feeling of Stress : Not at all  Social Connections: Moderately Integrated   Frequency of Communication with Friends and Family: More than three times a week   Frequency of Social Gatherings with Friends and Family: Twice a week   Attends Religious Services: More than 4 times per year   Active Member of [employer] or Organizations: No   Attends Archivist Meetings: Never   Marital Status: Married    ECOG Status: 0 - Asymptomatic  Review of Systems: A 12 point ROS discussed and pertinent positives are indicated in the HPI  above.  All other systems are negative.  Review of Systems  Vital Signs: BP (!) 153/73 (BP Location: Left Arm)   Pulse 100   SpO2 96%   Physical Exam Constitutional:      General: He is not in acute distress.    Appearance: Normal appearance.  HENT:     Head: Normocephalic and atraumatic.  Eyes:     General: No scleral icterus. Pulmonary:     Effort: Pulmonary effort is normal.  Skin:    General: Skin is warm and dry.  Neurological:     Mental Status: He is alert and oriented to person, place, and time.  Psychiatric:        Mood and Affect: Mood normal.        Behavior: Behavior normal.     Imaging: No results found.  Labs:  CBC: Recent Labs    10/12/20 1300 12/02/20 1149 04/06/21 0857 04/13/21 1502  WBC 4.8 3.6* 3.3* 4.0  HGB 11.5* 10.7* 7.1* 7.8*  HCT 34.5* 32.5* 22.6* 25.8*  PLT 55* 68* 76* 93*    COAGS: Recent Labs    06/01/20 0815 08/06/20 1124  INR 1.1 1.0    BMP: Recent Labs    06/01/20 0815 06/09/20 0859 08/10/20 1343 08/12/20 1104 10/12/20 1300 12/02/20 1149 04/06/21 0857  NA 139 141   < > 139 141 138 139  K 4.6 5.1   < > 4.5 4.6 5.0 4.7  CL 105 107   < > 103 108 105 108  CO2 24 28   < > '28 29 26 23  ' GLUCOSE 126* 175*   < > 169* 163* 181* 113*  BUN 26* 22   < > '19 17 19 18  ' CALCIUM 9.0 10.0   < >  9.5 9.4 8.9 8.6*  CREATININE 1.37* 1.33*   < > 1.33* 1.21 1.37* 1.15  GFRNONAA 49* 50*   < > 54* >60 52* >60  GFRAA 56* 59*  --   --   --   --   --    < > = values in this interval not displayed.    LIVER FUNCTION TESTS: Recent Labs    08/10/20 1343 10/12/20 1300 12/02/20 1149 04/06/21 0857  BILITOT 0.7 0.7 0.6 0.5  AST '23 25 28 25  ' ALT '17 15 14 11  ' ALKPHOS 100 100 115 109  PROT 6.9 6.9 6.4* 5.9*  ALBUMIN 3.8 3.7 3.5 2.8*    TUMOR MARKERS: No results for input(s): AFPTM, CEA, CA199, CHROMGRNA in the last 8760 hours.  Assessment and Plan:  Pleasant 80 year old gentleman with a history of hepatocellular carcinoma.  He is  currently under the care of Dr. Lisbeth Renshaw with radiation oncology.  He has a new lesion in the left hepatic lobe.  Unfortunately, due to a scheduling error he was inadvertently scheduled for consultation today even though he had already made the decision to proceed with SBRT of the new left hepatic lesion.  Since he and his wife drove to the office today we did go ahead and discussed the treatment option of microwave ablation.  At this time, he continues to lean toward continuing with SBRT given his good experience with the treatment previously.  He is currently scheduled for fiducial placement under ultrasound guidance this Thursday.  I enjoyed speaking with he and his wife and reviewing the microwave ablation procedure.  Thank you for this interesting consult.  I greatly enjoyed meeting [name] and look forward to participating in their care.  A copy of this report was sent to the requesting provider on this date.  Electronically Signed: Criselda Peaches 05/04/2021, 12:12 PM   I spent a total of  30 Minutes  in face to face in clinical consultation, greater than 50% of which was counseling/coordinating care for hepatocellular cancer.

## 2021-05-04 NOTE — Telephone Encounter (Signed)
CALLED PATIENT TO ASK IF HE WANTS TO DO LABS BEFORE OR AFTER PROCEDURE, SPOKE WITH PATIENT'S DAUGHTER TINA AND SHE AGREED TO DO AFTER PROCEDURE ON 05/06/21

## 2021-05-04 NOTE — Telephone Encounter (Signed)
Diabetic shoes/inserts in..lvm for pt to call to schedule an appt to pick them up. 

## 2021-05-05 ENCOUNTER — Other Ambulatory Visit: Payer: Self-pay | Admitting: Student

## 2021-05-06 ENCOUNTER — Other Ambulatory Visit: Payer: Self-pay | Admitting: Radiation Oncology

## 2021-05-06 ENCOUNTER — Other Ambulatory Visit: Payer: Self-pay

## 2021-05-06 ENCOUNTER — Encounter (HOSPITAL_COMMUNITY): Payer: Self-pay

## 2021-05-06 ENCOUNTER — Ambulatory Visit: Payer: Medicare HMO

## 2021-05-06 ENCOUNTER — Telehealth: Payer: Self-pay | Admitting: *Deleted

## 2021-05-06 ENCOUNTER — Ambulatory Visit (HOSPITAL_COMMUNITY)
Admission: RE | Admit: 2021-05-06 | Discharge: 2021-05-06 | Disposition: A | Discharge status: Home / Self Care | Payer: Medicare HMO | Source: Ambulatory Visit | Attending: Radiology | Admitting: Radiology

## 2021-05-06 ENCOUNTER — Ambulatory Visit (HOSPITAL_COMMUNITY)
Admission: RE | Admit: 2021-05-06 | Discharge: 2021-05-06 | Disposition: A | Discharge status: Home / Self Care | Payer: Medicare HMO | Source: Ambulatory Visit | Attending: Radiation Oncology | Admitting: Radiation Oncology

## 2021-05-06 DIAGNOSIS — I10 Essential (primary) hypertension: Secondary | ICD-10-CM | POA: Insufficient documentation

## 2021-05-06 DIAGNOSIS — J449 Chronic obstructive pulmonary disease, unspecified: Secondary | ICD-10-CM | POA: Diagnosis not present

## 2021-05-06 DIAGNOSIS — K769 Liver disease, unspecified: Secondary | ICD-10-CM | POA: Diagnosis not present

## 2021-05-06 DIAGNOSIS — Z79899 Other long term (current) drug therapy: Secondary | ICD-10-CM | POA: Diagnosis not present

## 2021-05-06 DIAGNOSIS — C220 Liver cell carcinoma: Secondary | ICD-10-CM

## 2021-05-06 DIAGNOSIS — E785 Hyperlipidemia, unspecified: Secondary | ICD-10-CM | POA: Insufficient documentation

## 2021-05-06 DIAGNOSIS — Z7982 Long term (current) use of aspirin: Secondary | ICD-10-CM | POA: Diagnosis not present

## 2021-05-06 DIAGNOSIS — Z87891 Personal history of nicotine dependence: Secondary | ICD-10-CM | POA: Insufficient documentation

## 2021-05-06 LAB — BASIC METABOLIC PANEL
Anion gap: 7 (ref 5–15)
BUN: 23 mg/dL (ref 8–23)
CO2: 27 mmol/L (ref 22–32)
Calcium: 9.1 mg/dL (ref 8.9–10.3)
Chloride: 104 mmol/L (ref 98–111)
Creatinine, Ser: 1.27 mg/dL — ABNORMAL HIGH (ref 0.61–1.24)
GFR, Estimated: 57 mL/min — ABNORMAL LOW (ref >60)
Glucose, Bld: 124 mg/dL — ABNORMAL HIGH (ref 70–99)
Potassium: 4.6 mmol/L (ref 3.5–5.1)
Sodium: 138 mmol/L (ref 135–145)

## 2021-05-06 LAB — CBC
HCT: 32.5 % — ABNORMAL LOW (ref 39.0–52.0)
Hemoglobin: 10.1 g/dL — ABNORMAL LOW (ref 13.0–17.0)
MCH: 31.1 pg (ref 26.0–34.0)
MCHC: 31.1 g/dL (ref 30.0–36.0)
MCV: 100 fL (ref 80.0–100.0)
Platelets: 100 10*3/uL — ABNORMAL LOW (ref 150–400)
RBC: 3.25 MIL/uL — ABNORMAL LOW (ref 4.22–5.81)
RDW: 18.9 % — ABNORMAL HIGH (ref 11.5–15.5)
WBC: 5 10*3/uL (ref 4.0–10.5)
nRBC: 0 % (ref 0.0–0.2)

## 2021-05-06 LAB — GLUCOSE, CAPILLARY: Glucose-Capillary: 118 mg/dL — ABNORMAL HIGH (ref 70–99)

## 2021-05-06 LAB — PROTIME-INR
INR: 1 (ref 0.8–1.2)
Prothrombin Time: 13.2 seconds (ref 11.4–15.2)

## 2021-05-06 MED ORDER — MIDAZOLAM HCL 2 MG/2ML IJ SOLN
INTRAMUSCULAR | Status: AC | PRN
  Administered 2021-05-06: 1 mg via INTRAVENOUS
  Administered 2021-05-06 (×2): 0.5 mg via INTRAVENOUS

## 2021-05-06 MED ORDER — SODIUM CHLORIDE (PF) 0.9 % IJ SOLN
INTRAMUSCULAR | Status: AC
  Filled 2021-05-06: qty 50

## 2021-05-06 MED ORDER — SODIUM CHLORIDE 0.9 % IV SOLN: INTRAVENOUS | Status: DC

## 2021-05-06 MED ORDER — IOHEXOL 350 MG/ML SOLN
100.0000 mL | Freq: Once | INTRAVENOUS | Status: AC | PRN
  Administered 2021-05-06: 70 mL via INTRAVENOUS

## 2021-05-06 MED ORDER — GELATIN ABSORBABLE 12-7 MM EX MISC
CUTANEOUS | Status: AC
  Filled 2021-05-06: qty 1

## 2021-05-06 MED ORDER — FENTANYL CITRATE (PF) 100 MCG/2ML IJ SOLN
INTRAMUSCULAR | Status: AC
  Filled 2021-05-06: qty 2

## 2021-05-06 MED ORDER — FENTANYL CITRATE (PF) 100 MCG/2ML IJ SOLN
INTRAMUSCULAR | Status: AC | PRN
  Administered 2021-05-06: 50 ug via INTRAVENOUS
  Administered 2021-05-06 (×2): 25 ug via INTRAVENOUS

## 2021-05-06 MED ORDER — MIDAZOLAM HCL 2 MG/2ML IJ SOLN
INTRAMUSCULAR | Status: AC
  Filled 2021-05-06: qty 2

## 2021-05-06 MED ORDER — IOHEXOL 350 MG/ML SOLN
100.0000 mL | Freq: Once | INTRAVENOUS | Status: AC | PRN
  Administered 2021-05-06: 80 mL via INTRAVENOUS

## 2021-05-06 MED ORDER — LIDOCAINE HCL (PF) 1 % IJ SOLN
INTRAMUSCULAR | Status: AC | PRN
  Administered 2021-05-06: 15 mL

## 2021-05-06 MED ORDER — SULFUR HEXAFLUORIDE MICROSPH 60.7-25 MG IJ SUSR
INTRAMUSCULAR | Status: AC
  Filled 2021-05-06: qty 5

## 2021-05-06 NOTE — Procedures (Signed)
Interventional Radiology Procedure Note  Procedure: CT guided fiducial markers placement  Indication: Hepatocellular carcinoma  Findings: Please refer to procedural dictation for full description.  Complications: None  EBL: < 10 mL  Miachel Roux, MD [phone]

## 2021-05-06 NOTE — H&P (Signed)
Chief Complaint: Patient was seen in consultation today for placement of fiducial markers in liver lesion  Referring Physician(s): [name]  Supervising Physician: [name]  Patient Status: [name] - Out-pt  History of Present Illness: [name] is a 80 y.o. male with hx of hepatocellular carcinoma.  He was initially seen by [name] for treatment of a right hepatic lesion.  However, intra-arterial therapy was not successful given occlusive disease in the iliofemoral arteries.  Subsequently, he was referred to [name] of radiation oncology and underwent SBRT therapy.   Now, he has a new lesion in the left hepatic lobe.  He had already made the decision to pursue repeat SBRT of this new lesion and has now been scheduled for US guided placement of fiducial markers. PMHx, meds, labs, imaging, allergies reviewed. Feels well, no recent fevers, chills, illness. Has been NPO today as directed.    Past Medical History:  Diagnosis Date   Benign prostatic hypertrophy    CHF (congestive heart failure) ([place])    a. EF 20-25% by echo in 11/2019   COPD (chronic obstructive pulmonary disease) ([place]) December 15, 2009   FEV1 2.30 (70%) ratio 63 no better with B2 and DLCO 18/6 (73%) corrects to 106%   Degenerative joint disease    Depression    Diverticulosis    Essential hypertension    ACE inhibitor cough   Fatty liver    Hyperlipidemia    Internal hemorrhoids    Low back pain    Otitis externa 07/30/2012   Splenomegaly    Type 2 diabetes mellitus ([place])    Ulcer of foot ([place])    Right foot    Past Surgical History:  Procedure Laterality Date   IR EMBO TUMOR ORGAN ISCHEMIA INFARCT INC GUIDE ROADMAPPING  06/01/2020   IR RADIOLOGIST EVAL & MGMT  04/28/2020   IR RADIOLOGIST EVAL & MGMT  07/15/2020   IR RADIOLOGIST EVAL & MGMT  05/04/2021   NO PAST SURGERIES  1980    Allergies: Patient has no known allergies.  Medications: Prior to Admission medications    Medication Sig Start Date End Date Taking? Authorizing Provider  acyclovir (ZOVIRAX) 400 MG tablet TAKE 1 TABLET TWICE DAILY 08/17/20   [name], MD  albuterol (VENTOLIN HFA) 108 (90 Base) MCG/ACT inhaler Inhale 2 puffs into the lungs every 6 (six) hours as needed for wheezing or shortness of breath. 11/29/19   [name], MD  amitriptyline (ELAVIL) 50 MG tablet TAKE 1 TABLET AT BEDTIME 01/04/21   [name], MD  ASCORBIC ACID PO Take 1 tablet by mouth daily.     [provider]  aspirin EC 81 MG tablet Take 81 mg by mouth daily.    [provider]  Blood Glucose Monitoring Suppl (TRUE METRIX AIR GLUCOSE METER) w/Device KIT  08/01/18   [provider]  busPIRone (BUSPAR) 7.5 MG tablet Take 1 tablet (7.5 mg total) by mouth 2 (two) times daily as needed. for anxiety 12/15/20   [name], MD  carvedilol (COREG) 6.25 MG tablet TAKE 1 TABLET TWICE DAILY (DOSE INCREASE) 11/25/20   Satira Sark, MD  Cholecalciferol 1000 UNITS capsule Take 1 capsule (1,000 Units total) by mouth 2 (two) times daily. 05/19/11   Parrett, Fonnie Mu, NP  clotrimazole (LOTRIMIN) 1 % cream For athlete's feet, apply to both feet and between toes twice daily for 4 weeks. 05/15/20   Marzetta Board, DPM  clotrimazole-betamethasone (LOTRISONE)  cream Apply 1 application topically 2 (two) times daily. For 7 days maximum for severe jock itch . Stop if worsening symptoms. 11/07/19   [name], MD  ENTRESTO 49-51 MG TAKE 1 TABLET TWICE DAILY 04/22/21   Satira Sark, MD  escitalopram [facility]) 5 MG tablet TAKE 1 TABLET EVERY DAY 02/01/21   [name], MD  famotidine (PEPCID) 20 MG tablet TAKE 1 TABLET TWICE DAILY 01/04/21   [name], MD  finasteride (PROSCAR) 5 MG tablet TAKE 1 TABLET EVERY DAY 01/04/21   [name], MD  furosemide (LASIX) 20 MG tablet TAKE 1 TABLET (20 MG TOTAL) BY MOUTH DAILY. 01/04/21 02/03/21  [name], MD  gabapentin  (NEURONTIN) 300 MG capsule TAKE 1 CAPSULE TWICE DAILY 03/15/21   [name], MD  glucose blood (TRUE METRIX BLOOD GLUCOSE TEST) test strip TEST BLOOD SUGAR EVERY DAY 11/05/19   [name], MD  metFORMIN (GLUCOPHAGE) 500 MG tablet Take 1 tablet (500 mg total) by mouth 3 (three) times daily with meals. 12/15/20   [name], MD  Multiple Vitamin (MULTIVITAMIN) capsule Take 1 capsule by mouth daily.    [provider]  mupirocin ointment (BACTROBAN) 2 % Apply to right foot ulcer once daily and cover with dressing. 02/18/20   Marzetta Board, DPM  NONFORMULARY OR COMPOUNDED ITEM [facility]  Peripheral Neuropathy Cream- Bupivacaine 1%, Doxepin 3%, Gabapentin 6%, Pentoxifylline 3%, Topiramate 1% Apply 1-2 grams to affected area 3-4 times daily Qty. 120 gm 3 refills 02/20/17   [provider]  omeprazole (PRILOSEC) 20 MG capsule TAKE 1 CAPSULE DAILY 30 TO 60 MINUTES BEFORE FIRST MEAL OF THE DAY 01/04/21   [name], MD  rosuvastatin (CRESTOR) 10 MG tablet TAKE 1 TABLET EVERY DAY (NEED MD APPOINTMENT FOR REFILLS) 03/09/21   [name], MD  sodium chloride (MURO 128) 5 % ophthalmic solution Place 1 drop into the left eye at bedtime.     [provider]  tamsulosin (FLOMAX) 0.4 MG CAPS capsule TAKE 1 CAPSULE EVERY DAY 01/04/21   [name], MD  TRUEPLUS LANCETS 28G MISC Use to test blood sugars two times daily. Dx: E11.9 07/30/18   [name], MD     Family History  Problem Relation Age of Onset   Melanoma Mother    Stroke Father    CAD Brother        Premature disease    Social History   Socioeconomic History   Marital status: Unknown    Spouse name: Not on file   Number of children: Not on file   Years of education: Not on file   Highest education level: Not on file  Occupational History   Occupation: retired    Comment: maintenence work  Tobacco Use   Smoking status: Former    Packs/day: 4.00    Years:  20.00    Pack years: 80.00    Types: Cigarettes    Quit date: 09/18/1984    Years since quitting: 36.6   Smokeless tobacco: Never   Tobacco comments:    quit in 1980  Vaping Use   Vaping Use: Never used  Substance and Sexual Activity   Alcohol use: No    Comment: no drinking since 30 years plus   Drug use: No   Sexual activity: Yes  Other Topics Concern   Not on file  Social History Narrative   ** Merged History Encounter **  Married 53 years in 2015. 4 kids (2 sons) and 3 grandkids.    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            Social Determinants of Radio broadcast assistant Strain: Low Risk    Difficulty of Paying Living Expenses: Not hard at all  Food Insecurity: No Food Insecurity   Worried About Charity fundraiser in the Last Year: Never true   Arboriculturist in the Last Year: Never true  Transportation Needs: No Transportation Needs   Lack of Transportation (Medical): No   Lack of Transportation (Non-Medical): No  Physical Activity: Inactive   Days of Exercise per Week: 0 days   Minutes of Exercise per Session: 0 min  Stress: No Stress Concern Present   Feeling of Stress : Not at all  Social Connections: Moderately Integrated   Frequency of Communication with Friends and Family: More than three times a week   Frequency of Social Gatherings with Friends and Family: Twice a week   Attends Religious Services: More than 4 times per year   Active Member of [employer] or Organizations: No   Attends Archivist Meetings: Never   Marital Status: Married    Review of Systems: A 12 point ROS discussed and pertinent positives are indicated in the HPI above.  All other systems are negative.  Review of Systems  Vital Signs: There were no vitals taken for this visit.  Physical Exam Constitutional:      Appearance: Normal appearance. He is not  ill-appearing.  HENT:     Mouth/Throat:     Mouth: Mucous membranes are moist.     Pharynx: Oropharynx is clear.  Cardiovascular:     Rate and Rhythm: Normal rate and regular rhythm.     Heart sounds: Normal heart sounds.  Pulmonary:     Effort: Pulmonary effort is normal.     Breath sounds: Normal breath sounds.  Abdominal:     General: Abdomen is flat. There is no distension.     Palpations: Abdomen is soft. There is no mass.     Tenderness: There is no abdominal tenderness.  Neurological:     General: No focal deficit present.     Mental Status: He is alert and oriented to person, place, and time.  Psychiatric:        Mood and Affect: Mood normal.        Thought Content: Thought content normal.        Judgment: Judgment normal.      Imaging: IR Radiologist Eval & Mgmt  Result Date: 05/04/2021 Please refer to notes tab for details about interventional procedure. (Op Note)   Labs:  CBC: Recent Labs    10/12/20 1300 12/02/20 1149 04/06/21 0857 04/13/21 1502  WBC 4.8 3.6* 3.3* 4.0  HGB 11.5* 10.7* 7.1* 7.8*  HCT 34.5* 32.5* 22.6* 25.8*  PLT 55* 68* 76* 93*    COAGS: Recent Labs    06/01/20 0815 08/06/20 1124  INR 1.1 1.0    BMP: Recent Labs    06/01/20 0815 06/09/20 0859 08/10/20 1343 08/12/20 1104 10/12/20 1300 12/02/20 1149 04/06/21 0857  NA 139 141   < > 139 141 138 139  K 4.6 5.1   < > 4.5 4.6 5.0 4.7  CL 105 107   < > 103 108  105 108  CO2 24 28   < > '28 29 26 23  ' GLUCOSE 126* 175*   < > 169* 163* 181* 113*  BUN 26* 22   < > '19 17 19 18  ' CALCIUM 9.0 10.0   < > 9.5 9.4 8.9 8.6*  CREATININE 1.37* 1.33*   < > 1.33* 1.21 1.37* 1.15  GFRNONAA 49* 50*   < > 54* >60 52* >60  GFRAA 56* 59*  --   --   --   --   --    < > = values in this interval not displayed.    LIVER FUNCTION TESTS: Recent Labs    08/10/20 1343 10/12/20 1300 12/02/20 1149 04/06/21 0857  BILITOT 0.7 0.7 0.6 0.5  AST '23 25 28 25  ' ALT '17 15 14 11  ' ALKPHOS 100 100 115  109  PROT 6.9 6.9 6.4* 5.9*  ALBUMIN 3.8 3.7 3.5 2.8*    TUMOR MARKERS: No results for input(s): AFPTM, CEA, CA199, CHROMGRNA in the last 8760 hours.  Assessment and Plan: Hepatocellular carcinoma, new left lobe lesion Plan for image guided placement of fiducial markers prior to scheduled SBRT Labs pending Risks and benefits of US guided hepatic fiducial placement was discussed with the patient and/or patient's family including, but not limited to bleeding, infection, damage to adjacent structures or low yield requiring additional tests.  All of the questions were answered and there is agreement to proceed.  Consent signed and in chart.   Thank you for this interesting consult.  I greatly enjoyed meeting JAESHAUN RIVA and look forward to participating in their care.  A copy of this report was sent to the requesting provider on this date.  Electronically Signed: Ascencion Dike, PA-C 05/06/2021, 11:27 AM   I spent a total of 20 minutes in face to face in clinical consultation, greater than 50% of which was counseling/coordinating care for US guided hepatic fiducials.

## 2021-05-06 NOTE — Discharge Instructions (Signed)
Urgent needs - Interventional Radiology on call MD [phone]  Wound - May remove dressing and shower tomorrow.  Keep site clean and dry.  Replace with bandaid as needed.  Do not submerge in tub or water until site healed.  No heavy lifting or strenuous activity this week.

## 2021-05-06 NOTE — Progress Notes (Signed)
Left message for pt to call back regarding scheduling procedures.

## 2021-05-06 NOTE — Telephone Encounter (Signed)
-----   Message from Algernon Huxley, RN sent at 05/06/2021  1:03 PM EDT ----- Carla Drape,  I have called and left a message for this pt to call back. I leave at 1:30 today and come back on 05/17/21. Can you make sure this gets done if he doesn't call back before I leave?  Thanks, Vaughan Basta ----- Message ----- From: Jerene Bears, MD Sent: 05/02/2021   3:44 PM EDT To: Algernon Huxley, RN  Note from Dr. Irene Limbo reviewed Pt needs EGD and colonoscopy given anemia, liver cancer, hx of multiple colon polyps This can be scheduled directly  Office visit 1st if patient prefers or has questions JMP  ----- Message ----- From: Brunetta Genera, MD Sent: 04/19/2021   5:41 PM EDT To: Jerene Bears, MD, [name], MD

## 2021-05-07 NOTE — Telephone Encounter (Signed)
I have spoken to patient who has scheduled direct endoscopy/colonoscopy on 06/07/21 at 230 pm in [place]. He is also scheduled for telephone previsit on 05/13/21 at 430 pm. He is in agreement with plan and indicates he has written down times/dates/locations for upcoming test.

## 2021-05-10 NOTE — Progress Notes (Signed)
Has armband been applied?  Yes  Does patient have an allergy to IV contrast dye?: no   Has patient ever received premedication for IV contrast dye?:  n/a  Does patient take metformin?: Yes  If patient does take metformin when was the last dose:   Date of lab work: 05/06/2021 BUN: 23 CR: 1.27 eGFR: 57  IV site: Right Forearm  Has IV site been added to flowsheet?  Yes

## 2021-05-11 ENCOUNTER — Other Ambulatory Visit: Payer: Self-pay

## 2021-05-11 ENCOUNTER — Ambulatory Visit
Admission: RE | Admit: 2021-05-11 | Discharge: 2021-05-11 | Disposition: A | Discharge status: Home / Self Care | Payer: Medicare HMO | Source: Ambulatory Visit | Attending: Radiation Oncology | Admitting: Radiation Oncology

## 2021-05-11 VITALS — BP 116/58 | HR 77 | Temp 96.9°F | Resp 18 | Ht 72.0 in | Wt 204.1 lb

## 2021-05-11 DIAGNOSIS — Z510 Encounter for antineoplastic radiation therapy: Secondary | ICD-10-CM | POA: Insufficient documentation

## 2021-05-11 DIAGNOSIS — C220 Liver cell carcinoma: Secondary | ICD-10-CM | POA: Insufficient documentation

## 2021-05-11 MED ORDER — SODIUM CHLORIDE 0.9% FLUSH
10.0000 mL | Freq: Once | INTRAVENOUS | Status: AC
  Administered 2021-05-11: 10 mL via INTRAVENOUS

## 2021-05-12 ENCOUNTER — Encounter: Payer: Medicare HMO | Admitting: Family Medicine

## 2021-05-12 NOTE — Progress Notes (Signed)
Sent request to the schedulers to set up appt

## 2021-05-13 ENCOUNTER — Other Ambulatory Visit: Payer: Medicare HMO

## 2021-05-13 ENCOUNTER — Telehealth: Payer: Self-pay | Admitting: *Deleted

## 2021-05-13 ENCOUNTER — Telehealth: Payer: Self-pay

## 2021-05-13 ENCOUNTER — Ambulatory Visit (AMBULATORY_SURGERY_CENTER): Payer: Medicare HMO

## 2021-05-13 ENCOUNTER — Other Ambulatory Visit: Payer: Self-pay

## 2021-05-13 VITALS — Ht 71.0 in | Wt 212.0 lb

## 2021-05-13 DIAGNOSIS — E1142 Type 2 diabetes mellitus with diabetic polyneuropathy: Secondary | ICD-10-CM | POA: Diagnosis not present

## 2021-05-13 DIAGNOSIS — M2041 Other hammer toe(s) (acquired), right foot: Secondary | ICD-10-CM | POA: Diagnosis not present

## 2021-05-13 DIAGNOSIS — K7030 Alcoholic cirrhosis of liver without ascites: Secondary | ICD-10-CM

## 2021-05-13 DIAGNOSIS — D500 Iron deficiency anemia secondary to blood loss (chronic): Secondary | ICD-10-CM

## 2021-05-13 DIAGNOSIS — M2042 Other hammer toe(s) (acquired), left foot: Secondary | ICD-10-CM | POA: Diagnosis not present

## 2021-05-13 DIAGNOSIS — D649 Anemia, unspecified: Secondary | ICD-10-CM

## 2021-05-13 DIAGNOSIS — L84 Corns and callosities: Secondary | ICD-10-CM | POA: Diagnosis not present

## 2021-05-13 DIAGNOSIS — Z86010 Personal history of colonic polyps: Secondary | ICD-10-CM

## 2021-05-13 MED ORDER — NA SULFATE-K SULFATE-MG SULF 17.5-3.13-1.6 GM/177ML PO SOLN: 1.0000 | Freq: Once | ORAL | 0 refills | Status: AC

## 2021-05-13 NOTE — Telephone Encounter (Signed)
If he has not used home oxygen in 4-5 months and his prescribing MD has recommended he turn oxygen equipment in it could be reasonable to proceed in the [facility] I will cc anesthesia for their opinion THanks [place]

## 2021-05-13 NOTE — Telephone Encounter (Signed)
PUDS-05/13/21

## 2021-05-13 NOTE — Progress Notes (Signed)
Pre visit completed via phone call; Patient verified name, DOB, and address; No egg or soy allergy known to patient  No issues with past sedation with any surgeries or procedures Patient denies ever being told they had issues or difficulty with intubation  No FH of Malignant Hyperthermia No diet pills per patient  Uses home O2 on an as needed basis-patient ONLY uses O2 when he "feels like I need it"; patient reports he has not used the O2 in about 4-5 months but still has on hand in case of "need"; patient has been cleared by MD to return O2 and insists he relinquish O2 and not be dependent on it- per pt  No blood thinners per patient  Pt denies issues with constipation at this time;  No A fib or A flutter  EMMI video via [place] 19 guidelines implemented in PV today with Pt and RN  Pt is fully vaccinated for Covid  Due to the COVID-19 pandemic we are asking patients to follow certain guidelines. Pt aware of COVID protocols and [facility] guidelines   NO PA's for preps discussed with pt in PV today  Discussed with pt there will be an out-of-pocket cost for prep and that varies from $0 to 70 dollars

## 2021-05-13 NOTE — Telephone Encounter (Signed)
Dr. Hilarie Fredrickson- At the end of the PV appt- this patient informed me that he uses home O2 on an "as needed" basis-patient ONLY uses O2 when he "feels like I need it because I fell like I get short of breath at night sometimes when my anxiety gets to me"; patient reports he has not used the O2 in about 4-5 months but still has on-hand in case of "need"; patient has been cleared by PCP to return O2 and insists he relinquish O2 and not be dependent on it- however, per pt- "I don't want to give it up";  Okay to proceed at [facility], reschedule to [place] or would you like to have the pt scheduled for an OV??

## 2021-05-14 ENCOUNTER — Ambulatory Visit (INDEPENDENT_AMBULATORY_CARE_PROVIDER_SITE_OTHER): Payer: Medicare HMO | Admitting: Family Medicine

## 2021-05-14 ENCOUNTER — Other Ambulatory Visit: Payer: Self-pay

## 2021-05-14 ENCOUNTER — Encounter: Payer: Self-pay | Admitting: Family Medicine

## 2021-05-14 VITALS — BP 136/77 | HR 58 | Temp 98.2°F | Ht 71.0 in | Wt 203.0 lb

## 2021-05-14 DIAGNOSIS — I509 Heart failure, unspecified: Secondary | ICD-10-CM

## 2021-05-14 DIAGNOSIS — M549 Dorsalgia, unspecified: Secondary | ICD-10-CM

## 2021-05-14 DIAGNOSIS — J449 Chronic obstructive pulmonary disease, unspecified: Secondary | ICD-10-CM | POA: Diagnosis not present

## 2021-05-14 DIAGNOSIS — F419 Anxiety disorder, unspecified: Secondary | ICD-10-CM

## 2021-05-14 DIAGNOSIS — Z0000 Encounter for general adult medical examination without abnormal findings: Secondary | ICD-10-CM

## 2021-05-14 DIAGNOSIS — D696 Thrombocytopenia, unspecified: Secondary | ICD-10-CM | POA: Diagnosis not present

## 2021-05-14 DIAGNOSIS — I10 Essential (primary) hypertension: Secondary | ICD-10-CM

## 2021-05-14 DIAGNOSIS — F3342 Major depressive disorder, recurrent, in full remission: Secondary | ICD-10-CM | POA: Diagnosis not present

## 2021-05-14 DIAGNOSIS — N1831 Chronic kidney disease, stage 3a: Secondary | ICD-10-CM | POA: Diagnosis not present

## 2021-05-14 DIAGNOSIS — E1142 Type 2 diabetes mellitus with diabetic polyneuropathy: Secondary | ICD-10-CM | POA: Diagnosis not present

## 2021-05-14 DIAGNOSIS — C220 Liver cell carcinoma: Secondary | ICD-10-CM

## 2021-05-14 DIAGNOSIS — E08621 Diabetes mellitus due to underlying condition with foot ulcer: Secondary | ICD-10-CM | POA: Diagnosis not present

## 2021-05-14 DIAGNOSIS — E785 Hyperlipidemia, unspecified: Secondary | ICD-10-CM

## 2021-05-14 DIAGNOSIS — L97412 Non-pressure chronic ulcer of right heel and midfoot with fat layer exposed: Secondary | ICD-10-CM

## 2021-05-14 DIAGNOSIS — B0230 Zoster ocular disease, unspecified: Secondary | ICD-10-CM

## 2021-05-14 MED ORDER — ROSUVASTATIN CALCIUM 10 MG PO TABS: ORAL_TABLET | ORAL | 3 refills | Status: DC

## 2021-05-14 MED ORDER — ESCITALOPRAM OXALATE 5 MG PO TABS: 5.0000 mg | ORAL_TABLET | Freq: Every day | ORAL | 3 refills | Status: DC

## 2021-05-14 MED ORDER — BUSPIRONE HCL 7.5 MG PO TABS: 7.5000 mg | ORAL_TABLET | Freq: Two times a day (BID) | ORAL | 3 refills | Status: DC | PRN

## 2021-05-14 NOTE — Patient Instructions (Addendum)
Health Maintenance Due  Topic Date Due   TETANUS/TDAP    -Please schedule an appointment at local pharmacy. 06/10/2016   OPHTHALMOLOGY EXAM   -Sign release of information at the check out desk for eye exam  01/25/2019   COVID-19 Vaccine (3 - Moderna risk series)  -.please let us know when you receive your shot as soon as possible.  12/30/2019   COLONOSCOPY   - already scheduled  02/27/2020   INFLUENZA VACCINE   - Please consider getting your flu shot in the Fall. If you get this outside of our office, please let us know.  05/10/2021    I wish you the best for your radiation treatment!  Recommended follow up: Return in about 4 months (around 09/13/2021) for for follow up or sooner as needed.

## 2021-05-14 NOTE — Progress Notes (Signed)
Phone: [phone]   Subjective:  Patient presents today for their annual physical. Chief complaint-noted.   See problem oriented charting- Review of Systems  Constitutional:  Negative for chills and fever.  HENT:  Negative for ear discharge and ear pain.   Eyes:  Positive for blurred vision. Negative for double vision.  Respiratory:  Positive for shortness of breath. Negative for hemoptysis.   Cardiovascular:  Negative for chest pain and palpitations.  Gastrointestinal:  Negative for heartburn, nausea and vomiting.  Genitourinary:  Negative for dysuria and frequency.  Musculoskeletal:  Positive for back pain and joint pain. Negative for myalgias and neck pain.  Skin:  Negative for itching and rash.  Neurological:  Negative for seizures and loss of consciousness.  Endo/Heme/Allergies:  Negative for polydipsia. Bruises/bleeds easily.  Psychiatric/Behavioral:  Negative for depression and suicidal ideas.    The following were reviewed and entered/updated in epic: Past Medical History:  Diagnosis Date   Benign prostatic hypertrophy    Blood transfusion without reported diagnosis    CHF (congestive heart failure) ([place])    a. 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 Off narcotics 06/05/17 due to negative UDS for opiates x2. Full history 06/12/14. Pain contract signed.  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[place]) 04/06/2007    Priority: High   Anxiety 02/25/2020    Priority: Medium   B12 deficiency 08/20/2015    Priority: Medium   Diabetic polyneuropathy associated with type 2 diabetes mellitus ([place]) 08/07/2015    Priority: Medium   CKD (chronic kidney disease), stage III ([place]) 05/06/2015    Priority: Medium   Fatty liver 11/04/2014    Priority: Medium   BPH (benign prostatic hyperplasia) 06/20/2007    Priority: Medium   Depression 05/02/2007    Priority: Medium   Hyperlipidemia associated with type 2 diabetes mellitus ([place]) 04/06/2007    Priority: Medium   Essential hypertension 04/06/2007    Priority: Medium   Osteoarthritis 04/06/2007    Priority: Medium   Iron deficiency anemia due to chronic blood loss 04/19/2021    Priority: Low   Aortic atherosclerosis ([place]) 12/14/2019    Priority: Low   GERD (gastroesophageal reflux disease) 09/30/2017    Priority: Low  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 positive 03/29/2016    Priority: Low   Diplopia 06/05/2015    Priority: Low   Candidal balanoposthitis 06/20/2014    Priority: Low   PCO (posterior capsular opacification) 06/19/2013    Priority: Low   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    Priority: Low   Pseudophakia of left eye 04/10/2013    Priority: Low   Nuclear cataract 01/20/2012    Priority: Low   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    Priority: Low   Anemia 03/05/2018   Past Surgical History:  Procedure Laterality  Date   COLONOSCOPY  2020   JMP-MAC-plenvu (good)-polyps-recall 1 yr   IR EMBO TUMOR ORGAN ISCHEMIA INFARCT INC GUIDE ROADMAPPING  06/01/2020   IR RADIOLOGIST EVAL & MGMT  04/28/2020   IR RADIOLOGIST EVAL & MGMT  07/15/2020   IR RADIOLOGIST EVAL & MGMT  05/04/2021   NO PAST SURGERIES  1980    Family History  Problem Relation Age of Onset   Melanoma Mother    Stroke Father    CAD Brother        Premature disease   Colon polyps Neg Hx    Colon cancer Neg Hx    Esophageal cancer Neg Hx    Stomach cancer Neg Hx    Rectal cancer Neg Hx     Medications- reviewed and updated Current Outpatient Medications  Medication Sig Dispense Refill   acyclovir (ZOVIRAX) 400 MG tablet TAKE 1 TABLET TWICE DAILY 180 tablet 1   albuterol (VENTOLIN HFA) 108 (90 Base) MCG/ACT inhaler Inhale 2 puffs into the lungs every 6 (six) hours as needed for wheezing or shortness of breath. 6.7 g 3   ASCORBIC ACID PO Take 1 tablet by mouth daily.      aspirin EC 81 MG tablet Take 81 mg by mouth daily.     Blood Glucose Monitoring Suppl (TRUE METRIX AIR GLUCOSE METER) w/Device KIT      carvedilol (COREG) 6.25 MG tablet TAKE 1 TABLET TWICE DAILY (DOSE INCREASE) 180 tablet 3   Cholecalciferol 1000 UNITS capsule Take 1 capsule (1,000 Units total) by mouth 2 (two) times daily.     clotrimazole (LOTRIMIN) 1 % cream For athlete's feet, apply to both feet and between toes twice daily for 4 weeks. 45 g 1   clotrimazole-betamethasone (LOTRISONE) cream Apply 1 application topically 2 (two) times daily. For 7 days maximum for severe jock itch . Stop if worsening symptoms. 45 g 1   ENTRESTO 49-51 MG TAKE 1 TABLET TWICE DAILY 180 tablet 3   famotidine (PEPCID) 20 MG tablet TAKE 1 TABLET TWICE DAILY 180 tablet 1   finasteride (PROSCAR) 5 MG tablet TAKE 1 TABLET EVERY DAY 90 tablet 1   gabapentin (NEURONTIN) 300 MG capsule TAKE 1 CAPSULE TWICE DAILY 180 capsule 3   glucose blood (TRUE METRIX BLOOD GLUCOSE TEST) test strip TEST  BLOOD SUGAR EVERY DAY 100 strip 1   metFORMIN (GLUCOPHAGE) 500 MG tablet Take 1 tablet (500 mg total) by mouth 3 (three) times daily with meals. 270 tablet 3   Multiple Vitamin (MULTIVITAMIN) capsule Take 1 capsule by mouth daily.     mupirocin ointment (BACTROBAN) 2 % Apply to right foot ulcer once daily and cover with dressing. 30 g 1   sodium chloride (MURO 128) 5 % ophthalmic solution Place 1 drop into the right eye at bedtime.     tamsulosin (FLOMAX) 0.4 MG CAPS capsule TAKE 1 CAPSULE EVERY DAY 90 capsule 1  TRUEPLUS LANCETS 28G MISC Use to test blood sugars two times daily. Dx: E11.9 200 each 3   busPIRone (BUSPAR) 7.5 MG tablet Take 1 tablet (7.5 mg total) by mouth 2 (two) times daily as needed. for anxiety 180 tablet 3   escitalopram (LEXAPRO) 5 MG tablet Take 1 tablet (5 mg total) by mouth daily. 90 tablet 3   furosemide (LASIX) 20 MG tablet TAKE 1 TABLET (20 MG TOTAL) BY MOUTH DAILY. 90 tablet 1   rosuvastatin (CRESTOR) 10 MG tablet TAKE 1 TABLET EVERY DAY 90 tablet 3   No current facility-administered medications for this visit.    Allergies-reviewed and updated No Known Allergies  Social History   Social History Narrative   ** Merged History Encounter **       Married 28 years in 2015. 4 kids (2 sons) and 3 grandkids.    Lived in [place], [place] wholel life      Retired from [employer], going to [employer] where they have a camper            Objective  Objective:  BP 136/77   Pulse (!) 58   Temp 98.2 F (36.8 C) (Temporal)   Ht 5' 11" (1.803 m)   Wt 203 lb (92.1 kg)   SpO2 97%   BMI 28.31 kg/m  Gen: NAD, resting comfortably HEENT: Mucous membranes are moist. Oropharynx normal Neck: no thyromegaly CV: RRR no murmurs rubs or gallops Lungs: CTAB no crackles, wheeze, rhonchi Abdomen: soft/nontender/protuberant abdomen/normal bowel sounds. No rebound or guarding.  Ext: trace edema Skin: warm, dry Neuro: grossly normal, moves all extremities,  PERRLA, walks with cane Diabetic Foot Exam - Simple   Simple Foot Form Diabetic Foot exam was performed with the following findings: Yes 05/14/2021  3:18 PM  Visual Inspection No deformities, no ulcerations, no other skin breakdown bilaterally: Yes Sensation Testing See comments: Yes Pulse Check Posterior Tibialis and Dorsalis pulse intact bilaterally: Yes Comments No sensation on feet to monofilament. 1+ pulses only. Discussed possible vascular studies- declines due to ongoing cancer workup/upcoming radiation       Assessment and Plan  80 y.o. male presenting for annual physical.  Health Maintenance counseling: 1. 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 Risk factor reduction:  Advised patient of need for regular exercise and diet rich and fruits and vegetables to reduce risk of heart attack and stroke. Exercise- limited by pain. Diet-has loosened up diet some due to concern about impending from cancer- thankfully he has done well with treatments so far - weight still down 5 lbs  Wt Readings from Last 3 Encounters:  05/14/21 203 lb (92.1 kg)  05/13/21 212 lb (96.2 kg)  05/11/21 204 lb 2 oz (92.6 kg)  3. Immunizations/screenings/ancillary studies-  DISCUSSED:  -TDAP - consider at pharmacy.  - Ophthalmology exam (yearly)- ROI planned   - COVID-19 vaccination (3rd dose)- please let us know when you get this at the pharmacy - Colonscopy -planned with Dr. Hilarie Fredrickson due to multiple polyps - flu vaccination (07/11/2019)- would recommend in the fall Immunization History  Administered Date(s) Administered   Influenza Split 09/07/2011, 07/30/2012   Influenza Whole  10/10/2005, 08/31/2009, 07/10/2010   Influenza,  High Dose Seasonal PF 06/22/2016, 06/29/2017, 09/20/2018, 07/11/2019   Influenza,inj,Quad PF,6+ Mos 07/01/2013, 06/20/2014   Influenza-Unspecified 10/26/2015, 09/12/2018   Meningococcal Conjugate 07/24/2014   Moderna Sars-Covid-2 Vaccination 10/22/2019, 12/02/2019   Pneumococcal Conjugate-13 07/24/2014   Pneumococcal Polysaccharide-23 08/31/2009   Td 06/10/2006   Zoster Recombinat (Shingrix) 12/11/2018, 05/03/2019  4. Prostate cancer screening- no longer a candidate due to age based screening  Lab Results  Component Value Date   PSA 0.11 12/01/2015   PSA 0.48 08/23/2011   PSA 2.15 01/04/2010   5. Colon cancer screening - May 2020 with 18 polyps and 1 year repeat- scheduled with Dr. Hilarie Fredrickson 6. Skin cancer screening- no dermatologist. advised regular sunscreen use. Denies worrisome, changing, or new skin lesions.  7. former smoker - quit in 1980s- no regular screening based on this  Status of chronic or acute concerns   #Hepatocellular carcinoma-following with Dr. Irene Limbo S: Not a surgical candidate. Patient has undergone radiation treatment.  On most recent MRI of the abdomen 1 area had shown significantly another area had increased slightly and was concerning for liver cancer-referral was made back to radiation oncology and patient has been treated with radiation therapy-last saw oncology a month ago with plan for 53-monthfollow-up at that time with MRI of abdomen before visit -Was also referred to GI for anemia from likely GI bleed- upcoming EGD and colonoscopy  A/P: Patient under close surveillance of oncology and thankful for contributions of radiation oncology-continue plan follow-up as noted in subjective portion - did not feel better after infusion of prbc- has felt better with iron infusion . Also reports upcoming egd/colonoscopy it sounds like  #ILD/COPD S: Patient carries a diagnosis of COPD due to former smoking. Also previously  diagnosed as having interstitial lung disease by pulmonology. He does not follow-up with them anymore. Fortunately breathing issues have not progressed. -Patient has had to use albuterol 1-3x x per day. has oxygen at home - uses some nights if feels SOB A/P:  Stable. Continue current medications- really albuterol primarily at this point  # Diabetes S: compliant with metformin 10051min AM and 50068mn the evening CBGs- 130s  Lab Results  Component Value Date   HGBA1C 6.5 (A) 12/15/2020   HGBA1C 5.9 (A) 02/25/2020   HGBA1C 5.9 (A) 11/07/2019  A/P: Diabetes has been well controlled-likely continue current medicine as long as A1c is 7.5 or less-update with labs today -Patient previously followed by podiatry for diabetic foot ulcer-has healed- sees them every 4 months   #/Congestive heart failure with EF 20 to 25% at low- most recently 50-55%/hypertension/CKD stage III -Chronic kidney disease has been stable with GFR in the 50s-several readings in 2019 and 2020 with GFR at 60 or above - most recently in 50s70s: Post heart failure hospitalization patient compliant with carvedilol 6.25 mg twice daily  Lasix 20 mg, Entresto started by cardiology. Prior to heart failure: amlodipine 55m42misinopril 2.5mg 21mema: trace but stable Weight gain: weight down slightly  Shortness of breath: stable  BP Readings from Last 3 Encounters:  05/14/21 136/77  05/11/21 (!) 116/58  05/06/21 120/62  A/P: For congestive heart failure appears euvolemic-continue current medication.  Hypertension well-controlled-continue current medication.  For CKD stage III hopefully stable-update CMP with labs today  #hyperlipidemia S: compliant with rosuvastatin 55mg 93my day. LDL goal under 70 Lab Results  Component Value Date   CHOL 120 11/07/2019   HDL 45.60 11/07/2019   LDLCALC 49 11/07/2019   LDLDIRECT 57.0 10/22/2018  TRIG 126.0 11/07/2019   CHOLHDL 3 11/07/2019   A/P: hopefully stable- update lipid panel today.  Continue current meds for now  # GERD S:compliant with omeprazole 42m in past- now off A/P: doing well without meds with weight loss- continue to monitor   # BPH S:compliant with flomax 0.464mand finasteride 8m43mRare nocturia - slightly worse recently.  A/P: slight worsening symptoms- compliant with meds- continue to monitor   # history of ocular herpes S:patient is compliant with acyclovir 400m48mD. Has been on this since 1980- apparently has had flare ups in past when tried to come off. Was in his right eye.  A/P: no recurrence- continue to monitor   #Chronic low back pain/diabetic neuropathy S: Used to be on narcotics but UDS's were negative for narcotics x2 and these were discontinued. Patient is compliant withgabapentin-he finds this helpful. off amitriptyline A/P: reasonable control- continue current meds   #Thrombocytopenia S: Follows with oncology- see extensive note December 20, 2018   -They continue to follow blood work Lab Results  Component Value Date   WBC 5.0 05/06/2021   HGB 10.1 (L) 05/06/2021   HCT 32.5 (L) 05/06/2021   MCV 100.0 05/06/2021   PLT 100 (L) 05/06/2021  A/P: stable recently- follow with oncology   # Depression Well controlled with Lexapro 5 mg started in 2021 also on amitriptyline  -patient denies any sleep issues which was primary reason he was using amitriptyline Patient stopped taking Prozac in 2020 or 2021. Previously been on amitriptyline 50 mg but we titrated off due to fall risk/age/potential memory concerns- he states not taking this Depression screen PHQ [facility] 05/14/2021 12/15/2020 12/15/2020  Decreased Interest 0 0 0  Down, Depressed, Hopeless 0 0 0  PHQ - 2 Score 0 0 0  Altered sleeping 0 0 -  Tired, decreased energy 0 3 -  Change in appetite 0 0 -  Feeling bad or failure about yourself  0 0 -  Trouble concentrating 0 1 -  Moving slowly or fidgety/restless 0 0 -  Suicidal thoughts 0 0 -  PHQ-9 Score 0 4 -  Difficult doing work/chores Not  difficult at all Not difficult at all -  Some recent data might be hidden   A/P: full remission- continue current meds- stay off amitriptyline with age and fall risk.   # Jock itch - can get bad and uses clotrimazone and betamethasone together andfinds helpful- no recent issues  Recommended follow up: No follow-ups on file. Future Appointments  Date Time Provider [place]16/2022 11:15 AM MoodKyung Rudd [facility]  05/27/2021 11:15 AM [facility]Kyung Rudd [facility]  05/31/2021 11:15 AM [facility]Kyung Rudd [facility]  06/02/2021 11:15 AM [facility]Kyung Rudd [facility]  06/04/2021 11:15 AM MoodKyung Rudd [facility]  06/07/2021  2:30 PM Pyrtle, Jay Lajuan Lines [facility]  06/25/2021  3:30 PM GalaMarzetta BoardM [facility]  07/01/2021  5:00 PM WL-MR 1 WL-MRI [place]  07/05/2021  1:40 PM McDoSatira Sark [facility] [place] [facility]  07/06/2021  1:30 PM [facility]ONC LAB CH[facility]-MEDONC None  07/06/2021  2:00 PM KaleBrunetta Genera [facility] [place]  08/09/2021  1:00 PM [facility] HEALTH COACH [facility] PEC   Lab/Order associations: NOT fasting   ICD-10-CM   1. Preventative health care  Z00.00     2. Controlled type 2 diabetes mellitus with diabetic polyneuropathy, without long-term current use of insulin (HCC)  E11.42 Comprehensive metabolic panel  CBC with Differential/Platelet    Lipid panel    HgB A1c    3. Chronic obstructive pulmonary disease, unspecified COPD type ([place])  J44.9     4. Recurrent major depressive disorder, in full remission ([place])  F33.42     5. Hepatocellular carcinoma ([place])  C22.0     6. Diabetic ulcer of right midfoot associated with diabetes mellitus due to underlying condition, with fat layer exposed ([place])  J18.841    L97.412     7. Thrombocytopenia ([place])  D69.6     8. Congestive heart failure, unspecified HF chronicity, unspecified heart failure type ([place])  I50.9     9. Stage 3a chronic kidney disease (HCC)   N18.31     10. Anxiety  F41.9     11. Essential hypertension  I10     12. Hyperlipidemia, unspecified hyperlipidemia type  E78.5     13. Back pain, unspecified back location, unspecified back pain laterality, unspecified chronicity  M54.9     14. Ocular herpes  B02.30      Meds ordered this encounter  Medications   escitalopram (LEXAPRO) 5 MG tablet    Sig: Take 1 tablet (5 mg total) by mouth daily.    Dispense:  90 tablet    Refill:  3   busPIRone (BUSPAR) 7.5 MG tablet    Sig: Take 1 tablet (7.5 mg total) by mouth 2 (two) times daily as needed. for anxiety    Dispense:  180 tablet    Refill:  3   rosuvastatin (CRESTOR) 10 MG tablet    Sig: TAKE 1 TABLET EVERY DAY    Dispense:  90 tablet    Refill:  3    I,Jada Bradford,acting as a scribe for [name], MD.,have documented all relevant documentation on the behalf of [name], MD,as directed by  [name], MD while in the presence of [name], MD.  I, [name], MD, have reviewed all documentation for this visit. The documentation on 05/14/21 for the exam, diagnosis, procedures, and orders are all accurate and complete.  Return precautions advised.  [name], MD

## 2021-05-14 NOTE — Telephone Encounter (Signed)
Noted- PV completed on 05/13/2021 virtually

## 2021-05-20 ENCOUNTER — Ambulatory Visit: Payer: Medicare HMO | Admitting: Radiation Oncology

## 2021-05-21 ENCOUNTER — Telehealth: Payer: Self-pay | Admitting: Pharmacist

## 2021-05-21 ENCOUNTER — Ambulatory Visit: Payer: Medicare HMO | Admitting: Radiation Oncology

## 2021-05-21 NOTE — Chronic Care Management (AMB) (Addendum)
Chronic Care Management Pharmacy Assistant   Name: [name]  MRN: [id number] DOB: [birth year]  Reason for Encounter: Diabetes Adherence Call    Recent office visits:  05/14/2021 OV (PCP) [name], MD; chronic follow up, no medication changes indicated.  Recent consult visits:  None  Hospital visits:  None in previous 6 months  Medications: Outpatient Encounter Medications as of 05/21/2021  Medication Sig Note   acyclovir (ZOVIRAX) 400 MG tablet TAKE 1 TABLET TWICE DAILY    albuterol (VENTOLIN HFA) 108 (90 Base) MCG/ACT inhaler Inhale 2 puffs into the lungs every 6 (six) hours as needed for wheezing or shortness of breath.    ASCORBIC ACID PO Take 1 tablet by mouth daily.     aspirin EC 81 MG tablet Take 81 mg by mouth daily.    Blood Glucose Monitoring Suppl (TRUE METRIX AIR GLUCOSE METER) w/Device KIT     busPIRone (BUSPAR) 7.5 MG tablet Take 1 tablet (7.5 mg total) by mouth 2 (two) times daily as needed. for anxiety    carvedilol (COREG) 6.25 MG tablet TAKE 1 TABLET TWICE DAILY (DOSE INCREASE)    Cholecalciferol 1000 UNITS capsule Take 1 capsule (1,000 Units total) by mouth 2 (two) times daily.    clotrimazole (LOTRIMIN) 1 % cream For athlete's feet, apply to both feet and between toes twice daily for 4 weeks.    clotrimazole-betamethasone (LOTRISONE) cream Apply 1 application topically 2 (two) times daily. For 7 days maximum for severe jock itch . Stop if worsening symptoms. 12/03/2019: Unknown start date    ENTRESTO 49-51 MG TAKE 1 TABLET TWICE DAILY    escitalopram (LEXAPRO) 5 MG tablet Take 1 tablet (5 mg total) by mouth daily.    famotidine (PEPCID) 20 MG tablet TAKE 1 TABLET TWICE DAILY    finasteride (PROSCAR) 5 MG tablet TAKE 1 TABLET EVERY DAY    furosemide (LASIX) 20 MG tablet TAKE 1 TABLET (20 MG TOTAL) BY MOUTH DAILY.    gabapentin (NEURONTIN) 300 MG capsule TAKE 1 CAPSULE TWICE DAILY    glucose blood (TRUE METRIX BLOOD GLUCOSE TEST) test strip TEST  BLOOD SUGAR EVERY DAY    metFORMIN (GLUCOPHAGE) 500 MG tablet Take 1 tablet (500 mg total) by mouth 3 (three) times daily with meals.    Multiple Vitamin (MULTIVITAMIN) capsule Take 1 capsule by mouth daily.    mupirocin ointment (BACTROBAN) 2 % Apply to right foot ulcer once daily and cover with dressing.    rosuvastatin (CRESTOR) 10 MG tablet TAKE 1 TABLET EVERY DAY    sodium chloride (MURO 128) 5 % ophthalmic solution Place 1 drop into the right eye at bedtime.    tamsulosin (FLOMAX) 0.4 MG CAPS capsule TAKE 1 CAPSULE EVERY DAY    TRUEPLUS LANCETS 28G MISC Use to test blood sugars two times daily. Dx: E11.9    No facility-administered encounter medications on file as of 05/21/2021.   Recent Relevant Labs: Lab Results  Component Value Date/Time   HGBA1C 6.5 (A) 12/15/2020 02:54 PM   HGBA1C 5.9 (A) 02/25/2020 01:37 PM   HGBA1C 7.1 (H) 10/22/2018 02:51 PM   HGBA1C 7.7 (H) 09/28/2017 02:14 PM   MICROALBUR 60.3 (H) 01/29/2018 12:52 PM   MICROALBUR 7.8 (H) 12/01/2015 09:40 AM    Kidney Function Lab Results  Component Value Date/Time   CREATININE 1.27 (H) 05/06/2021 12:10 PM   CREATININE 1.15 04/06/2021 08:57 AM   CREATININE 1.37 (H) 12/02/2020 11:49 AM   GFR 66.66 02/05/2020 02:04 PM  GFRNONAA 57 (L) 05/06/2021 12:10 PM   GFRNONAA >60 04/06/2021 08:57 AM   GFRAA 59 (L) 06/09/2020 08:59 AM    Current antihyperglycemic regimen:  Metformin 500 mg TID with meals  What recent interventions/DTPs have been made to improve glycemic control:  No interventions or DTPs.  Have there been any recent hospitalizations or ED visits since last visit with CPP? No  Patient denies hypoglycemic symptoms.  Patient denies hyperglycemic symptoms.  How often are you checking your blood sugar? Patients daughter states the patient is not currently checking his blood sugar.  What are your blood sugars ranging?  Fasting: n/a Before meals: n/a After meals: n/a Bedtime: n/a  During the week, how  often does your blood glucose drop below 70? None that the patient is aware of.  Are you checking your feet daily/regularly? Patients daughter states the patient checks his feet daily.  Adherence Review: Is the patient currently on a STATIN medication? Yes Is the patient currently on ACE/ARB medication? Yes Does the patient have >5 day gap between last estimated fill dates? No   Future Appointments  Date Time Provider [place]  05/25/2021 11:15 AM Kyung Rudd, MD [facility] None  05/27/2021 11:15 AM Kyung Rudd, MD [facility] None  05/31/2021 11:15 AM Kyung Rudd, MD [facility] None  06/02/2021 11:15 AM Kyung Rudd, MD [facility] None  06/04/2021 11:15 AM Kyung Rudd, MD [facility] None  06/07/2021  2:30 PM Pyrtle, Lajuan Lines, MD [facility]  06/25/2021  3:30 PM Marzetta Board, DPM [facility]  07/01/2021  5:00 PM [facility]-MR 1 [facility] [place]  07/05/2021  1:40 PM Satira Sark, MD [facility]  07/06/2021  1:30 PM [facility] LAB [facility] None  07/06/2021  2:00 PM Brunetta Genera, MD [facility] None  08/09/2021  1:00 PM [facility] HEALTH COACH [facility]  09/14/2021  1:40 PM [name], MD [facility]     Star Rating Drugs: Rosuvastatin last filled 03/15/2021 90 DS Entresto 49-51 mg last filled 04/22/2021 90 DS Metformin last filled 03/15/2021 90 DS  April D Calhoun, [place] Pharmacist Assistant [phone]   8 minutes spent in review, coordination, and documentation.  Reviewed by: [name], PharmD Clinical Pharmacist [phone]

## 2021-05-24 ENCOUNTER — Ambulatory Visit: Payer: Medicare HMO | Admitting: Radiation Oncology

## 2021-05-25 ENCOUNTER — Ambulatory Visit: Payer: Medicare HMO | Admitting: Radiation Oncology

## 2021-05-25 ENCOUNTER — Ambulatory Visit
Admission: RE | Admit: 2021-05-25 | Discharge: 2021-05-25 | Disposition: A | Discharge status: Home / Self Care | Payer: Medicare HMO | Source: Ambulatory Visit | Attending: Radiation Oncology | Admitting: Radiation Oncology

## 2021-05-25 ENCOUNTER — Other Ambulatory Visit: Payer: Self-pay

## 2021-05-25 DIAGNOSIS — Z510 Encounter for antineoplastic radiation therapy: Secondary | ICD-10-CM | POA: Diagnosis not present

## 2021-05-25 DIAGNOSIS — J449 Chronic obstructive pulmonary disease, unspecified: Secondary | ICD-10-CM | POA: Diagnosis not present

## 2021-05-25 DIAGNOSIS — J9611 Chronic respiratory failure with hypoxia: Secondary | ICD-10-CM | POA: Diagnosis not present

## 2021-05-25 DIAGNOSIS — C220 Liver cell carcinoma: Secondary | ICD-10-CM | POA: Diagnosis not present

## 2021-05-26 ENCOUNTER — Other Ambulatory Visit: Payer: Self-pay | Admitting: Family Medicine

## 2021-05-26 ENCOUNTER — Ambulatory Visit: Payer: Medicare HMO | Admitting: Radiation Oncology

## 2021-05-27 ENCOUNTER — Ambulatory Visit
Admission: RE | Admit: 2021-05-27 | Discharge: 2021-05-27 | Disposition: A | Discharge status: Home / Self Care | Payer: Medicare HMO | Source: Ambulatory Visit | Attending: Radiation Oncology | Admitting: Radiation Oncology

## 2021-05-27 ENCOUNTER — Other Ambulatory Visit: Payer: Self-pay

## 2021-05-27 DIAGNOSIS — Z510 Encounter for antineoplastic radiation therapy: Secondary | ICD-10-CM | POA: Diagnosis not present

## 2021-05-27 DIAGNOSIS — C220 Liver cell carcinoma: Secondary | ICD-10-CM | POA: Diagnosis not present

## 2021-05-28 ENCOUNTER — Other Ambulatory Visit: Payer: Self-pay | Admitting: Family Medicine

## 2021-05-28 ENCOUNTER — Ambulatory Visit: Payer: Medicare HMO | Admitting: Radiation Oncology

## 2021-05-31 ENCOUNTER — Ambulatory Visit
Admission: RE | Admit: 2021-05-31 | Discharge: 2021-05-31 | Disposition: A | Discharge status: Home / Self Care | Payer: Medicare HMO | Source: Ambulatory Visit | Attending: Radiation Oncology | Admitting: Radiation Oncology

## 2021-05-31 ENCOUNTER — Other Ambulatory Visit: Payer: Self-pay

## 2021-05-31 DIAGNOSIS — C220 Liver cell carcinoma: Secondary | ICD-10-CM | POA: Diagnosis not present

## 2021-05-31 DIAGNOSIS — Z510 Encounter for antineoplastic radiation therapy: Secondary | ICD-10-CM | POA: Diagnosis not present

## 2021-06-01 ENCOUNTER — Telehealth: Payer: Self-pay

## 2021-06-01 ENCOUNTER — Ambulatory Visit: Payer: Medicare HMO | Admitting: Radiation Oncology

## 2021-06-01 NOTE — Telephone Encounter (Signed)
-----   Message from Jerene Bears, MD sent at 06/01/2021  4:43 PM EDT ----- Regarding: FW: [place] pt Need to more procedure to hospital ----- Message ----- From: Osvaldo Angst, CRNA Sent: 06/01/2021  12:16 PM EDT To: Jerene Bears, MD Subject: [facility] pt                                         Dr. Hilarie Fredrickson,  This pt is scheduled with you on 8/29.  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  He requires  O2 at home so his procedure will need to be done at the hospital.  Thanks,  Osvaldo Angst

## 2021-06-01 NOTE — Telephone Encounter (Signed)
Left message to return call 

## 2021-06-02 ENCOUNTER — Other Ambulatory Visit: Payer: Self-pay

## 2021-06-02 ENCOUNTER — Other Ambulatory Visit: Payer: Self-pay | Admitting: Internal Medicine

## 2021-06-02 ENCOUNTER — Ambulatory Visit
Admission: RE | Admit: 2021-06-02 | Discharge: 2021-06-02 | Disposition: A | Discharge status: Home / Self Care | Payer: Medicare HMO | Source: Ambulatory Visit | Attending: Radiation Oncology | Admitting: Radiation Oncology

## 2021-06-02 DIAGNOSIS — D649 Anemia, unspecified: Secondary | ICD-10-CM

## 2021-06-02 DIAGNOSIS — Z86010 Personal history of colonic polyps: Secondary | ICD-10-CM

## 2021-06-02 DIAGNOSIS — D500 Iron deficiency anemia secondary to blood loss (chronic): Secondary | ICD-10-CM

## 2021-06-02 DIAGNOSIS — C220 Liver cell carcinoma: Secondary | ICD-10-CM | POA: Diagnosis not present

## 2021-06-02 DIAGNOSIS — Z510 Encounter for antineoplastic radiation therapy: Secondary | ICD-10-CM | POA: Diagnosis not present

## 2021-06-02 DIAGNOSIS — K7030 Alcoholic cirrhosis of liver without ascites: Secondary | ICD-10-CM

## 2021-06-02 NOTE — Telephone Encounter (Signed)
Per Osvaldo Angst, after further review, patient does need hospital procedure. I have spoken with patient who agrees to this plan. Appt currently scheduled for 08/23/21 at [facility]. Patient does not wish to have procedure completed with any other provider that Dr Hilarie Fredrickson. Dr Vena Rua current first available is 08/23/21. I am going to speak with Dr Hilarie Fredrickson to see if there are alternatives to providing procedure sooner.

## 2021-06-02 NOTE — Telephone Encounter (Signed)
Note sent to Osvaldo Angst, CRNA for clarification regarding his recommendations. Will await return response.

## 2021-06-03 ENCOUNTER — Ambulatory Visit: Payer: Medicare HMO | Admitting: Radiation Oncology

## 2021-06-03 NOTE — Telephone Encounter (Signed)
It is okay to wait for these procedures until November 14 however I would also like to see him back in the office prior to this.  His diagnosis of cirrhosis and hepatocellular carcinoma occurred after his last office visit here. He can be placed on my schedule anytime prior to procedures, he can be on the cancellation list Thank you

## 2021-06-03 NOTE — Telephone Encounter (Signed)
Dr Hilarie Fredrickson- I have tentatively scheduled patient for ECL on 08/23/21 at the hospital. However, he has anemia, hx colon polyps and liver cancer. Is he okay to wait until November? He prefers only you to do his procedure. Your only other availability prior to this would be hospital week 07/05/21. Please advise.Marland KitchenMarland Kitchen

## 2021-06-03 NOTE — Telephone Encounter (Signed)
Pt has been scheduled for the next available follow-up with Dr. Hilarie Fredrickson for 08-05-21 @ 3 pm

## 2021-06-04 ENCOUNTER — Encounter: Payer: Self-pay | Admitting: Radiation Oncology

## 2021-06-04 ENCOUNTER — Other Ambulatory Visit: Payer: Self-pay

## 2021-06-04 ENCOUNTER — Ambulatory Visit
Admission: RE | Admit: 2021-06-04 | Discharge: 2021-06-04 | Disposition: A | Discharge status: Home / Self Care | Payer: Medicare HMO | Source: Ambulatory Visit | Attending: Radiation Oncology | Admitting: Radiation Oncology

## 2021-06-04 DIAGNOSIS — C220 Liver cell carcinoma: Secondary | ICD-10-CM | POA: Diagnosis not present

## 2021-06-04 DIAGNOSIS — Z510 Encounter for antineoplastic radiation therapy: Secondary | ICD-10-CM | POA: Diagnosis not present

## 2021-06-05 NOTE — Telephone Encounter (Signed)
Per Magdalene River, CMA, patient has been advised of need for office visit and he has agreed to come on 08/05/21 to see Dr Hilarie Fredrickson. Patient will be given endoscopy/colonoscopy prep instructions for his 08/23/21 appointment at that time.

## 2021-06-07 ENCOUNTER — Encounter: Payer: Self-pay | Admitting: Hematology

## 2021-06-07 ENCOUNTER — Encounter: Payer: Medicare HMO | Admitting: Internal Medicine

## 2021-06-07 NOTE — Progress Notes (Signed)
                                                                                                                                                             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 He had mild fatigue but no other complaints at the conclusion of treatment.  Plan: The patient will receive a call in about one month from the radiation oncology department. He will continue follow up with Dr. Irene Limbo as well.   ________________________________________________    Carola Rhine, [facility]

## 2021-06-25 ENCOUNTER — Ambulatory Visit: Payer: Medicare HMO | Admitting: Podiatry

## 2021-06-25 ENCOUNTER — Other Ambulatory Visit: Payer: Self-pay

## 2021-06-25 ENCOUNTER — Encounter: Payer: Self-pay | Admitting: Podiatry

## 2021-06-25 DIAGNOSIS — J9611 Chronic respiratory failure with hypoxia: Secondary | ICD-10-CM | POA: Diagnosis not present

## 2021-06-25 DIAGNOSIS — B351 Tinea unguium: Secondary | ICD-10-CM | POA: Diagnosis not present

## 2021-06-25 DIAGNOSIS — M79674 Pain in right toe(s): Secondary | ICD-10-CM

## 2021-06-25 DIAGNOSIS — J449 Chronic obstructive pulmonary disease, unspecified: Secondary | ICD-10-CM | POA: Diagnosis not present

## 2021-06-25 DIAGNOSIS — S90229A Contusion of unspecified lesser toe(s) with damage to nail, initial encounter: Secondary | ICD-10-CM

## 2021-06-25 DIAGNOSIS — M79675 Pain in left toe(s): Secondary | ICD-10-CM | POA: Diagnosis not present

## 2021-06-25 DIAGNOSIS — L84 Corns and callosities: Secondary | ICD-10-CM | POA: Diagnosis not present

## 2021-06-25 DIAGNOSIS — E1142 Type 2 diabetes mellitus with diabetic polyneuropathy: Secondary | ICD-10-CM | POA: Diagnosis not present

## 2021-06-25 NOTE — Patient Instructions (Signed)
[name],  Please apply Neosporin to the right 4th toe nailbed once daily for one week. He had a blood blister under the toenail.   Have a great weekend!  Dr. Elisha Ponder

## 2021-07-01 ENCOUNTER — Other Ambulatory Visit: Payer: Self-pay

## 2021-07-01 ENCOUNTER — Ambulatory Visit (HOSPITAL_COMMUNITY)
Admission: RE | Admit: 2021-07-01 | Discharge: 2021-07-01 | Disposition: A | Discharge status: Home / Self Care | Payer: Medicare HMO | Source: Ambulatory Visit | Attending: Hematology | Admitting: Hematology

## 2021-07-01 DIAGNOSIS — K7689 Other specified diseases of liver: Secondary | ICD-10-CM | POA: Diagnosis not present

## 2021-07-01 DIAGNOSIS — K7460 Unspecified cirrhosis of liver: Secondary | ICD-10-CM | POA: Diagnosis not present

## 2021-07-01 DIAGNOSIS — C220 Liver cell carcinoma: Secondary | ICD-10-CM | POA: Insufficient documentation

## 2021-07-01 DIAGNOSIS — N281 Cyst of kidney, acquired: Secondary | ICD-10-CM | POA: Diagnosis not present

## 2021-07-01 MED ORDER — GADOBUTROL 1 MMOL/ML IV SOLN
9.0000 mL | Freq: Once | INTRAVENOUS | Status: AC | PRN
  Administered 2021-07-01: 9 mL via INTRAVENOUS

## 2021-07-01 NOTE — Progress Notes (Signed)
  Subjective:  Patient ID: [name], male    DOB: 1941/06/30,  MRN: [id number]  [name] presents to clinic today for at risk foot care with history of diabetic neuropathy and callus(es) right foot and painful thick toenails that are difficult to trim. Painful toenails interfere with ambulation. Aggravating factors include wearing enclosed shoe gear. Pain is relieved with periodic professional debridement. Painful calluses are aggravated when weightbearing with and without shoegear. Pain is relieved with periodic professional debridement.  Patient states he has received radiation treatment for his liver cancer. States he will be getting  MRI testing done to see if it has responded to the radiation  He has received his new diabetic shoes. Mr. Lubeck voices no new pedal problems on today's visit.  He states blood glucose was 146 mg/dl today.  PCP is Dr. [name] and last visit was 03/15/2021  No Known Allergies  Review of Systems: Negative except as noted in the HPI. Objective:   Constitutional VINCENZO STAVE is a pleasant 80 y.o. Caucasian male, in NAD. AAO x 3.   Vascular Capillary refill time to digits immediate b/l. Palpable pedal pulses b/l LE. Pedal hair absent. Lower extremity skin temperature gradient within normal limits. No pain with calf compression b/l. No edema noted b/l lower extremities. No cyanosis or clubbing noted.  Neurologic Normal speech. Oriented to person, place, and time. Protective sensation diminished with 10g monofilament b/l.  Dermatologic Pedal skin with normal turgor, texture and tone b/l lower extremities. No open wounds b/l lower extremities. No interdigital macerations b/l lower extremities. Toenails 1-5 b/l elongated, discolored, dystrophic, thickened, crumbly with subungual debris and tenderness to dorsal palpation. There is evidence of acute subungual hematoma of the R 4th toe. Nailplate is loose. There is tenderness to palpation.  Hyperkeratotic lesion(s) submet head 1 right foot.  No erythema, no edema, no drainage, no fluctuance.  Orthopedic: Normal muscle strength 5/5 to all lower extremity muscle groups bilaterally. No pain crepitus or joint limitation noted with ROM b/l. Hammertoe(s) noted to the 1-5 bilaterally.   Radiographs: None Assessment:   1. Pain due to onychomycosis of toenails of both feet   2. Callus   3. Diabetic peripheral neuropathy associated with type 2 diabetes mellitus ([place])     Plan:  -No new findings. No new orders. -Continue diabetic foot care principles: inspect feet daily, monitor glucose as recommended by PCP and/or Endocrinologist, and follow prescribed diet per PCP, Endocrinologist and/or dietician. -Patient to continue soft, supportive shoe gear daily. -Toenails 1-5 left, 1, 2, 3, 5 right were debrided in length and girth with sterile nail nippers and dremel without iatrogenic bleeding.  -Subungual hematoma evacuated revealing loose nailplate. Right 4th toe nailplate gently debrided from it's remaining attachment to digit. Nailbed cleansed with alcohol. Triple antibiotic ointment applied. Printed instructions addressed to his wife to apply Neosporin to right 4th toe nailbed once daily for one week.  -Callus(es) submet head 1 right foot pared utilizing sterile scalpel blade without complication or incident. Total number debrided =1. -Patient to report any pedal injuries to medical professional immediately. -Patient/POA to call should there be question/concern in the interim.  Return in about 9 weeks (around 08/27/2021).  Marzetta Board, DPM

## 2021-07-04 ENCOUNTER — Encounter: Payer: Self-pay | Admitting: Cardiology

## 2021-07-04 NOTE — Progress Notes (Signed)
Cardiology Office Note  Date: 07/05/2021   ID: [name], [name] 06-Jul-1941, MRN [id number]  PCP:  [name], MD  Cardiologist:  Rozann Lesches, MD Electrophysiologist:  None   Chief Complaint  Patient presents with   Cardiac follow-up     History of Present Illness: [name] is an 80 y.o. male last seen in March.  He is here today with his wife for a follow-up visit.  States that he has had loose stools recently, no abdominal pain.  He has been nervous in anticipation of follow-up with Oncology this week, history of hepatocellular carcinoma.  He does not report any additional fluid retention, weight is down approximately 5 pounds.  He states that he has been compliant with medications although [name] price has recently gone up and he has had some difficulties getting it filled.  We are looking into assistance.  His last echocardiogram in September 2021 revealed LVEF 50 to 55%, also mild to moderate aortic stenosis.  Past Medical History:  Diagnosis Date   Benign prostatic hypertrophy    Blood transfusion without reported diagnosis    CHF (congestive heart failure) (HCC)    a. EF 20-25% by echo in 11/2019   COPD (chronic obstructive pulmonary disease) ([place]) 12/15/2009   FEV1 2.30 (70%) ratio 63 no better with B2 and DLCO 18/6 (73%) corrects to 106%   Degenerative joint disease    Depression    Diverticulosis    Essential hypertension    ACE inhibitor cough   Fatty liver    Hyperlipidemia    Internal hemorrhoids    Liver cancer ([place])    Low back pain    Otitis externa 07/30/2012   Splenomegaly    Type 2 diabetes mellitus (HCC)    Ulcer of foot (HCC)    Right foot    Past Surgical History:  Procedure Laterality Date   COLONOSCOPY  2020   JMP-MAC-plenvu (good)-polyps-recall 1 yr   IR EMBO TUMOR ORGAN ISCHEMIA INFARCT INC GUIDE ROADMAPPING  06/01/2020   IR RADIOLOGIST EVAL & MGMT  04/28/2020   IR RADIOLOGIST EVAL & MGMT  07/15/2020   IR  RADIOLOGIST EVAL & MGMT  05/04/2021   NO PAST SURGERIES  1980    Current Outpatient Medications  Medication Sig Dispense Refill   acyclovir (ZOVIRAX) 400 MG tablet TAKE 1 TABLET TWICE DAILY 180 tablet 1   albuterol (VENTOLIN HFA) 108 (90 Base) MCG/ACT inhaler Inhale 2 puffs into the lungs every 6 (six) hours as needed for wheezing or shortness of breath. 6.7 g 3   ASCORBIC ACID PO Take 1 tablet by mouth daily.      aspirin EC 81 MG tablet Take 81 mg by mouth daily.     Blood Glucose Monitoring Suppl (TRUE METRIX AIR GLUCOSE METER) w/Device KIT      busPIRone (BUSPAR) 7.5 MG tablet Take 1 tablet (7.5 mg total) by mouth 2 (two) times daily as needed. for anxiety 180 tablet 3   carvedilol (COREG) 6.25 MG tablet TAKE 1 TABLET TWICE DAILY (DOSE INCREASE) 180 tablet 3   Cholecalciferol 1000 UNITS capsule Take 1 capsule (1,000 Units total) by mouth 2 (two) times daily.     clotrimazole (LOTRIMIN) 1 % cream For athlete's feet, apply to both feet and between toes twice daily for 4 weeks. 45 g 1   clotrimazole-betamethasone (LOTRISONE) cream Apply 1 application topically 2 (two) times daily. For 7 days maximum for severe jock itch . Stop if worsening symptoms.  45 g 1   ENTRESTO 49-51 MG TAKE 1 TABLET TWICE DAILY 180 tablet 3   escitalopram (LEXAPRO) 5 MG tablet Take 1 tablet (5 mg total) by mouth daily. 90 tablet 3   famotidine (PEPCID) 20 MG tablet TAKE 1 TABLET TWICE DAILY 180 tablet 1   finasteride (PROSCAR) 5 MG tablet TAKE 1 TABLET EVERY DAY 90 tablet 1   gabapentin (NEURONTIN) 300 MG capsule TAKE 1 CAPSULE TWICE DAILY 180 capsule 3   glucose blood (TRUE METRIX BLOOD GLUCOSE TEST) test strip TEST BLOOD SUGAR EVERY DAY 100 strip 1   metFORMIN (GLUCOPHAGE) 500 MG tablet Take 1 tablet (500 mg total) by mouth 3 (three) times daily with meals. 270 tablet 3   Multiple Vitamin (MULTIVITAMIN) capsule Take 1 capsule by mouth daily.     mupirocin ointment (BACTROBAN) 2 % Apply to right foot ulcer once daily  and cover with dressing. 30 g 1   rosuvastatin (CRESTOR) 10 MG tablet TAKE 1 TABLET EVERY DAY 90 tablet 3   sodium chloride (MURO 128) 5 % ophthalmic solution Place 1 drop into the right eye at bedtime.     SUPREP BOWEL PREP KIT 17.5-3.13-1.6 GM/177ML SOLN      tamsulosin (FLOMAX) 0.4 MG CAPS capsule TAKE 1 CAPSULE EVERY DAY 90 capsule 1   TRUEPLUS LANCETS 28G MISC Use to test blood sugars two times daily. Dx: E11.9 200 each 3   furosemide (LASIX) 20 MG tablet TAKE 1 TABLET (20 MG TOTAL) BY MOUTH DAILY. 90 tablet 1   No current facility-administered medications for this visit.   Allergies:  Patient has no known allergies.   ROS: No palpitations or syncope.  Physical Exam: VS:  BP 126/66   Pulse 82   Ht _0  (1.803 m)   Wt 198 lb (89.8 kg)   SpO2 95%   BMI 27.62 kg/m , BMI Body mass index is 27.62 kg/m.  Wt Readings from Last 3 Encounters:  07/05/21 198 lb (89.8 kg)  05/14/21 203 lb (92.1 kg)  05/13/21 212 lb (96.2 kg)    General: Patient appears comfortable at rest. HEENT: Conjunctiva and lids normal, wearing a mask. Neck: Supple, no elevated JVP or carotid bruits, no thyromegaly. Lungs: Clear to auscultation, nonlabored breathing at rest. Cardiac: Regular rate and rhythm, no S3, 3/6 systolic murmur. Extremities: No pitting edema.  ECG:  An ECG dated 12/14/2020 was personally reviewed today and demonstrated:  Sinus rhythm with incomplete right bundle branch block and nonspecific ST-T changes.  Recent Labwork: 04/06/2021: ALT 11; AST 25 05/06/2021: BUN 23; Creatinine, Ser 1.27; Hemoglobin 10.1; Platelets 100; Potassium 4.6; Sodium 138     Component Value Date/Time   CHOL 120 11/07/2019 1146   TRIG 126.0 11/07/2019 1146   HDL 45.60 11/07/2019 1146   CHOLHDL 3 11/07/2019 1146   VLDL 25.2 11/07/2019 1146   LDLCALC 49 11/07/2019 1146   LDLDIRECT 57.0 10/22/2018 1451    Other Studies Reviewed Today:  Echocardiogram 06/17/2020:  1. Left ventricular ejection fraction, by  estimation, is 50 to 55%. The  left ventricle has low normal function. The left ventricle has no regional  wall motion abnormalities. There is mild left ventricular hypertrophy.  Left ventricular diastolic  parameters are consistent with Grade I diastolic dysfunction (impaired  relaxation). Elevated left atrial pressure.   2. Right ventricular systolic function is normal. The right ventricular  size is normal.   3. Left atrial size was severely dilated.   4. The mitral valve is normal in  structure. No evidence of mitral valve  regurgitation. No evidence of mitral stenosis.   5. The aortic valve was not well visualized. There is moderate  calcification of the aortic valve. There is moderate thickening of the  aortic valve. Aortic valve regurgitation is not visualized. Mild to  moderate aortic valve stenosis. Aortic valve mean  gradient measures 16.0 mmHg. Aortic valve peak gradient measures 25.8  mmHg. Aortic valve area, by VTI measures 1.23 cm.   6. The inferior vena cava is normal in size with greater than 50%  respiratory variability, suggesting right atrial pressure of 3 mmHg.   Assessment and Plan:  1.  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  We have been managing him conservatively in the setting of hepatocellular carcinoma.  Plan to continue Coreg, Entresto, aspirin, Crestor, and Lasix.  No additional therapies at this time.  2.  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  We will consider a follow-up echocardiogram for his next visit.  3.  Hepatocellular carcinoma, continues to follow with oncology with anticipated reevaluation this week and review of MRI.  Medication Adjustments/Labs and Tests Ordered: Current medicines are reviewed at length with the patient today.  Concerns regarding medicines are outlined above.   Tests Ordered: No orders of the defined  types were placed in this encounter.   Medication Changes: No orders of the defined types were placed in this encounter.   Disposition:  Follow up  6 months  Signed, Satira Sark, MD, [facility] 07/05/2021 2:07 PM    [facility] [address] [place], [place] [address] Phone: [phone]; Fax: [phone]

## 2021-07-05 ENCOUNTER — Ambulatory Visit: Payer: Medicare HMO | Admitting: Cardiology

## 2021-07-05 ENCOUNTER — Other Ambulatory Visit: Payer: Self-pay

## 2021-07-05 ENCOUNTER — Encounter: Payer: Self-pay | Admitting: Cardiology

## 2021-07-05 VITALS — BP 126/66 | HR 82 | Ht 71.0 in | Wt 198.0 lb

## 2021-07-05 DIAGNOSIS — I350 Nonrheumatic aortic (valve) stenosis: Secondary | ICD-10-CM

## 2021-07-05 DIAGNOSIS — I5022 Chronic systolic (congestive) heart failure: Secondary | ICD-10-CM | POA: Diagnosis not present

## 2021-07-05 DIAGNOSIS — D500 Iron deficiency anemia secondary to blood loss (chronic): Secondary | ICD-10-CM

## 2021-07-05 NOTE — Patient Instructions (Signed)

## 2021-07-06 ENCOUNTER — Inpatient Hospital Stay: Payer: Medicare HMO

## 2021-07-06 ENCOUNTER — Other Ambulatory Visit: Payer: Self-pay

## 2021-07-06 ENCOUNTER — Telehealth: Payer: Self-pay | Admitting: Internal Medicine

## 2021-07-06 ENCOUNTER — Inpatient Hospital Stay: Payer: Medicare HMO | Attending: Hematology | Admitting: Hematology

## 2021-07-06 VITALS — BP 126/60 | HR 78 | Temp 97.8°F | Resp 17 | Ht 71.0 in | Wt 197.3 lb

## 2021-07-06 DIAGNOSIS — Z7982 Long term (current) use of aspirin: Secondary | ICD-10-CM | POA: Insufficient documentation

## 2021-07-06 DIAGNOSIS — I509 Heart failure, unspecified: Secondary | ICD-10-CM | POA: Diagnosis not present

## 2021-07-06 DIAGNOSIS — Z79899 Other long term (current) drug therapy: Secondary | ICD-10-CM | POA: Diagnosis not present

## 2021-07-06 DIAGNOSIS — D500 Iron deficiency anemia secondary to blood loss (chronic): Secondary | ICD-10-CM | POA: Diagnosis not present

## 2021-07-06 DIAGNOSIS — E538 Deficiency of other specified B group vitamins: Secondary | ICD-10-CM | POA: Diagnosis not present

## 2021-07-06 DIAGNOSIS — C220 Liver cell carcinoma: Secondary | ICD-10-CM | POA: Diagnosis not present

## 2021-07-06 DIAGNOSIS — R197 Diarrhea, unspecified: Secondary | ICD-10-CM

## 2021-07-06 DIAGNOSIS — I110 Hypertensive heart disease with heart failure: Secondary | ICD-10-CM | POA: Diagnosis not present

## 2021-07-06 DIAGNOSIS — D696 Thrombocytopenia, unspecified: Secondary | ICD-10-CM | POA: Diagnosis not present

## 2021-07-06 DIAGNOSIS — Z87891 Personal history of nicotine dependence: Secondary | ICD-10-CM | POA: Diagnosis not present

## 2021-07-06 LAB — CMP (CANCER CENTER ONLY)
ALT: 15 U/L (ref 0–44)
AST: 32 U/L (ref 15–41)
Albumin: 3.2 g/dL — ABNORMAL LOW (ref 3.5–5.0)
Alkaline Phosphatase: 103 U/L (ref 38–126)
Anion gap: 9 (ref 5–15)
BUN: 14 mg/dL (ref 8–23)
CO2: 22 mmol/L (ref 22–32)
Calcium: 8.9 mg/dL (ref 8.9–10.3)
Chloride: 106 mmol/L (ref 98–111)
Creatinine: 0.98 mg/dL (ref 0.61–1.24)
GFR, Estimated: >60 mL/min (ref >60)
Glucose, Bld: 120 mg/dL — ABNORMAL HIGH (ref 70–99)
Potassium: 4.9 mmol/L (ref 3.5–5.1)
Sodium: 137 mmol/L (ref 135–145)
Total Bilirubin: 0.9 mg/dL (ref 0.3–1.2)
Total Protein: 6 g/dL — ABNORMAL LOW (ref 6.5–8.1)

## 2021-07-06 LAB — CBC WITH DIFFERENTIAL (CANCER CENTER ONLY)
Abs Immature Granulocytes: 0.01 10*3/uL (ref 0.00–0.07)
Basophils Absolute: 0 10*3/uL (ref 0.0–0.1)
Basophils Relative: 1 %
Eosinophils Absolute: 0 10*3/uL (ref 0.0–0.5)
Eosinophils Relative: 1 %
HCT: 29.8 % — ABNORMAL LOW (ref 39.0–52.0)
Hemoglobin: 9.9 g/dL — ABNORMAL LOW (ref 13.0–17.0)
Immature Granulocytes: 0 %
Lymphocytes Relative: 13 %
Lymphs Abs: 0.6 10*3/uL — ABNORMAL LOW (ref 0.7–4.0)
MCH: 34 pg (ref 26.0–34.0)
MCHC: 33.2 g/dL (ref 30.0–36.0)
MCV: 102.4 fL — ABNORMAL HIGH (ref 80.0–100.0)
Monocytes Absolute: 0.3 10*3/uL (ref 0.1–1.0)
Monocytes Relative: 7 %
Neutro Abs: 3.5 10*3/uL (ref 1.7–7.7)
Neutrophils Relative %: 78 %
Platelet Count: 83 10*3/uL — ABNORMAL LOW (ref 150–400)
RBC: 2.91 MIL/uL — ABNORMAL LOW (ref 4.22–5.81)
RDW: 17.8 % — ABNORMAL HIGH (ref 11.5–15.5)
WBC Count: 4.4 10*3/uL (ref 4.0–10.5)
nRBC: 0 % (ref 0.0–0.2)

## 2021-07-06 LAB — SAMPLE TO BLOOD BANK

## 2021-07-06 LAB — IRON AND TIBC
Iron: 97 ug/dL (ref 42–163)
Saturation Ratios: 42 % (ref 20–55)
TIBC: 231 ug/dL (ref 202–409)
UIBC: 134 ug/dL (ref 117–376)

## 2021-07-06 LAB — FERRITIN: Ferritin: 123 ng/mL (ref 24–336)

## 2021-07-06 NOTE — Telephone Encounter (Signed)
Pt states that he has been experiencing diarrhea for the last three weeks very frequently . Pt states that now he has a poor appetite and and feels weak. Pt states that his stools are dark. Pt states that he has been taking Imodium with no relief . Pt requested an earlier appt than the one he has already scheduled. Pt was scheduled with Carl Best NP on 07/14/2021 @ 1:30. Pt made aware.  Please advise in regard to pt symptoms.

## 2021-07-06 NOTE — Telephone Encounter (Signed)
Pt was to have hospital EGD/colonoscopy  He has hx of cirrhosis and HCC treated by IR with embolization Saw oncology today, HGb stable, ferritin > 100, CMP okay Recent MRI reviewed, more ascites than previously Needs GI pathogen panel  If worsening before he is seen next week I recommend he go to the ER given the complexity of his medical issues Can use loperamide 2-4 mg TID PRN

## 2021-07-06 NOTE — Telephone Encounter (Signed)
Pt given Dr. Hilarie Fredrickson Recommendations. Lab for stool sample entered into Epic; Pt was encouraged if he worsens before he is seen next week in the office here then he should go to the ER. Pt verbalized understanding with all questions answered.

## 2021-07-08 ENCOUNTER — Other Ambulatory Visit: Payer: Medicare HMO

## 2021-07-08 DIAGNOSIS — E1142 Type 2 diabetes mellitus with diabetic polyneuropathy: Secondary | ICD-10-CM

## 2021-07-08 DIAGNOSIS — R197 Diarrhea, unspecified: Secondary | ICD-10-CM | POA: Diagnosis not present

## 2021-07-11 NOTE — Progress Notes (Signed)
  Radiation Oncology         [phone]) [phone] ________________________________  Name: [name] MRN: [id number]  Date of Service: 07/12/2021  DOB: [birth year]  Post Treatment Telephone Note  Diagnosis:   Hepatocellular Carcinoma  Interval Since Last Radiation:  6 weeks   05/25/2021 through 06/04/2021 Site Technique Total Dose (Gy) Dose per Fx (Gy) Completed Fx Beam Energies  Liver: Liver IMRT 60/60 12 5/5 6XFFF    Narrative:  The patient was contacted today for routine follow-up. During treatment he did very well with radiotherapy and did not have significant desquamation.    Impression/Plan: 1. Hepatocellular Carcinoma. I was unable to reach the patient but left a voicemail and on the message, I  discussed that we would be happy to continue to follow him as needed, but he will also continue to follow up with Dr. Irene Limbo in medical oncology. He was counseled on skin care as well as measures to avoid sun exposure to this area.      Carola Rhine, PAC

## 2021-07-12 ENCOUNTER — Ambulatory Visit
Admission: RE | Admit: 2021-07-12 | Discharge: 2021-07-12 | Disposition: A | Discharge status: Home / Self Care | Payer: Medicare HMO | Source: Ambulatory Visit | Attending: Radiation Oncology | Admitting: Radiation Oncology

## 2021-07-12 ENCOUNTER — Encounter: Payer: Self-pay | Admitting: Hematology

## 2021-07-12 ENCOUNTER — Emergency Department (HOSPITAL_COMMUNITY): Payer: Medicare HMO

## 2021-07-12 ENCOUNTER — Encounter (HOSPITAL_COMMUNITY): Payer: Self-pay | Admitting: *Deleted

## 2021-07-12 ENCOUNTER — Other Ambulatory Visit: Payer: Self-pay

## 2021-07-12 ENCOUNTER — Inpatient Hospital Stay (HOSPITAL_COMMUNITY)
Admission: EM | Admit: 2021-07-12 | Discharge: 2021-07-19 | DRG: 391 | Disposition: A | Discharge status: Home Health | Payer: Medicare HMO | Attending: Internal Medicine | Admitting: Internal Medicine

## 2021-07-12 DIAGNOSIS — I8510 Secondary esophageal varices without bleeding: Secondary | ICD-10-CM | POA: Diagnosis present

## 2021-07-12 DIAGNOSIS — D61818 Other pancytopenia: Secondary | ICD-10-CM | POA: Diagnosis not present

## 2021-07-12 DIAGNOSIS — K7030 Alcoholic cirrhosis of liver without ascites: Secondary | ICD-10-CM | POA: Diagnosis not present

## 2021-07-12 DIAGNOSIS — I10 Essential (primary) hypertension: Secondary | ICD-10-CM | POA: Diagnosis present

## 2021-07-12 DIAGNOSIS — E538 Deficiency of other specified B group vitamins: Secondary | ICD-10-CM | POA: Diagnosis present

## 2021-07-12 DIAGNOSIS — R630 Anorexia: Secondary | ICD-10-CM | POA: Diagnosis not present

## 2021-07-12 DIAGNOSIS — I5022 Chronic systolic (congestive) heart failure: Secondary | ICD-10-CM | POA: Diagnosis present

## 2021-07-12 DIAGNOSIS — D509 Iron deficiency anemia, unspecified: Secondary | ICD-10-CM | POA: Diagnosis present

## 2021-07-12 DIAGNOSIS — K3189 Other diseases of stomach and duodenum: Secondary | ICD-10-CM | POA: Diagnosis present

## 2021-07-12 DIAGNOSIS — D500 Iron deficiency anemia secondary to blood loss (chronic): Secondary | ICD-10-CM | POA: Diagnosis not present

## 2021-07-12 DIAGNOSIS — I130 Hypertensive heart and chronic kidney disease with heart failure and stage 1 through stage 4 chronic kidney disease, or unspecified chronic kidney disease: Secondary | ICD-10-CM | POA: Diagnosis present

## 2021-07-12 DIAGNOSIS — E871 Hypo-osmolality and hyponatremia: Secondary | ICD-10-CM | POA: Diagnosis present

## 2021-07-12 DIAGNOSIS — E785 Hyperlipidemia, unspecified: Secondary | ICD-10-CM | POA: Diagnosis present

## 2021-07-12 DIAGNOSIS — F1011 Alcohol abuse, in remission: Secondary | ICD-10-CM | POA: Diagnosis present

## 2021-07-12 DIAGNOSIS — C220 Liver cell carcinoma: Secondary | ICD-10-CM | POA: Diagnosis present

## 2021-07-12 DIAGNOSIS — Z823 Family history of stroke: Secondary | ICD-10-CM

## 2021-07-12 DIAGNOSIS — K7469 Other cirrhosis of liver: Secondary | ICD-10-CM

## 2021-07-12 DIAGNOSIS — E1149 Type 2 diabetes mellitus with other diabetic neurological complication: Secondary | ICD-10-CM | POA: Diagnosis present

## 2021-07-12 DIAGNOSIS — K7460 Unspecified cirrhosis of liver: Secondary | ICD-10-CM

## 2021-07-12 DIAGNOSIS — K529 Noninfective gastroenteritis and colitis, unspecified: Secondary | ICD-10-CM | POA: Diagnosis present

## 2021-07-12 DIAGNOSIS — Z20822 Contact with and (suspected) exposure to covid-19: Secondary | ICD-10-CM | POA: Diagnosis present

## 2021-07-12 DIAGNOSIS — N1832 Chronic kidney disease, stage 3b: Secondary | ICD-10-CM | POA: Diagnosis present

## 2021-07-12 DIAGNOSIS — E440 Moderate protein-calorie malnutrition: Secondary | ICD-10-CM | POA: Diagnosis present

## 2021-07-12 DIAGNOSIS — J449 Chronic obstructive pulmonary disease, unspecified: Secondary | ICD-10-CM | POA: Diagnosis present

## 2021-07-12 DIAGNOSIS — K766 Portal hypertension: Secondary | ICD-10-CM | POA: Diagnosis present

## 2021-07-12 DIAGNOSIS — Z6829 Body mass index (BMI) 29.0-29.9, adult: Secondary | ICD-10-CM

## 2021-07-12 DIAGNOSIS — J189 Pneumonia, unspecified organism: Secondary | ICD-10-CM | POA: Diagnosis not present

## 2021-07-12 DIAGNOSIS — F419 Anxiety disorder, unspecified: Secondary | ICD-10-CM | POA: Diagnosis present

## 2021-07-12 DIAGNOSIS — K5730 Diverticulosis of large intestine without perforation or abscess without bleeding: Secondary | ICD-10-CM | POA: Diagnosis present

## 2021-07-12 DIAGNOSIS — K909 Intestinal malabsorption, unspecified: Secondary | ICD-10-CM | POA: Diagnosis present

## 2021-07-12 DIAGNOSIS — K449 Diaphragmatic hernia without obstruction or gangrene: Secondary | ICD-10-CM | POA: Diagnosis not present

## 2021-07-12 DIAGNOSIS — Z79899 Other long term (current) drug therapy: Secondary | ICD-10-CM

## 2021-07-12 DIAGNOSIS — N183 Chronic kidney disease, stage 3 unspecified: Secondary | ICD-10-CM | POA: Diagnosis not present

## 2021-07-12 DIAGNOSIS — N390 Urinary tract infection, site not specified: Secondary | ICD-10-CM | POA: Diagnosis present

## 2021-07-12 DIAGNOSIS — K2900 Acute gastritis without bleeding: Secondary | ICD-10-CM | POA: Diagnosis not present

## 2021-07-12 DIAGNOSIS — K649 Unspecified hemorrhoids: Secondary | ICD-10-CM | POA: Diagnosis not present

## 2021-07-12 DIAGNOSIS — Z808 Family history of malignant neoplasm of other organs or systems: Secondary | ICD-10-CM

## 2021-07-12 DIAGNOSIS — Z8505 Personal history of malignant neoplasm of liver: Secondary | ICD-10-CM

## 2021-07-12 DIAGNOSIS — R188 Other ascites: Secondary | ICD-10-CM | POA: Diagnosis present

## 2021-07-12 DIAGNOSIS — R197 Diarrhea, unspecified: Secondary | ICD-10-CM | POA: Diagnosis not present

## 2021-07-12 DIAGNOSIS — R509 Fever, unspecified: Secondary | ICD-10-CM | POA: Diagnosis not present

## 2021-07-12 DIAGNOSIS — E860 Dehydration: Secondary | ICD-10-CM | POA: Diagnosis present

## 2021-07-12 DIAGNOSIS — E1122 Type 2 diabetes mellitus with diabetic chronic kidney disease: Secondary | ICD-10-CM | POA: Diagnosis present

## 2021-07-12 DIAGNOSIS — E1142 Type 2 diabetes mellitus with diabetic polyneuropathy: Secondary | ICD-10-CM | POA: Diagnosis present

## 2021-07-12 DIAGNOSIS — R109 Unspecified abdominal pain: Secondary | ICD-10-CM | POA: Diagnosis not present

## 2021-07-12 DIAGNOSIS — N1831 Chronic kidney disease, stage 3a: Secondary | ICD-10-CM | POA: Diagnosis present

## 2021-07-12 DIAGNOSIS — R634 Abnormal weight loss: Secondary | ICD-10-CM | POA: Diagnosis not present

## 2021-07-12 DIAGNOSIS — R54 Age-related physical debility: Secondary | ICD-10-CM | POA: Diagnosis present

## 2021-07-12 DIAGNOSIS — Z87891 Personal history of nicotine dependence: Secondary | ICD-10-CM

## 2021-07-12 DIAGNOSIS — D72819 Decreased white blood cell count, unspecified: Secondary | ICD-10-CM | POA: Diagnosis not present

## 2021-07-12 DIAGNOSIS — R531 Weakness: Secondary | ICD-10-CM

## 2021-07-12 DIAGNOSIS — Z8249 Family history of ischemic heart disease and other diseases of the circulatory system: Secondary | ICD-10-CM

## 2021-07-12 DIAGNOSIS — K648 Other hemorrhoids: Secondary | ICD-10-CM | POA: Diagnosis present

## 2021-07-12 DIAGNOSIS — Z7982 Long term (current) use of aspirin: Secondary | ICD-10-CM

## 2021-07-12 DIAGNOSIS — Z7984 Long term (current) use of oral hypoglycemic drugs: Secondary | ICD-10-CM

## 2021-07-12 LAB — GI PROFILE, STOOL, PCR

## 2021-07-12 LAB — CBC WITH DIFFERENTIAL/PLATELET
Abs Immature Granulocytes: 0.01 10*3/uL (ref 0.00–0.07)
Basophils Absolute: 0 10*3/uL (ref 0.0–0.1)
Basophils Relative: 1 %
Eosinophils Absolute: 0.1 10*3/uL (ref 0.0–0.5)
Eosinophils Relative: 1 %
HCT: 32.8 % — ABNORMAL LOW (ref 39.0–52.0)
Hemoglobin: 11 g/dL — ABNORMAL LOW (ref 13.0–17.0)
Immature Granulocytes: 0 %
Lymphocytes Relative: 12 %
Lymphs Abs: 0.6 10*3/uL — ABNORMAL LOW (ref 0.7–4.0)
MCH: 34.8 pg — ABNORMAL HIGH (ref 26.0–34.0)
MCHC: 33.5 g/dL (ref 30.0–36.0)
MCV: 103.8 fL — ABNORMAL HIGH (ref 80.0–100.0)
Monocytes Absolute: 0.4 10*3/uL (ref 0.1–1.0)
Monocytes Relative: 7 %
Neutro Abs: 4.2 10*3/uL (ref 1.7–7.7)
Neutrophils Relative %: 79 %
Platelets: 97 10*3/uL — ABNORMAL LOW (ref 150–400)
RBC: 3.16 MIL/uL — ABNORMAL LOW (ref 4.22–5.81)
RDW: 17.5 % — ABNORMAL HIGH (ref 11.5–15.5)
WBC: 5.3 10*3/uL (ref 4.0–10.5)
nRBC: 0 % (ref 0.0–0.2)

## 2021-07-12 LAB — COMPREHENSIVE METABOLIC PANEL
ALT: 16 U/L (ref 0–44)
AST: 30 U/L (ref 15–41)
Albumin: 3.1 g/dL — ABNORMAL LOW (ref 3.5–5.0)
Alkaline Phosphatase: 82 U/L (ref 38–126)
Anion gap: 8 (ref 5–15)
BUN: 10 mg/dL (ref 8–23)
CO2: 21 mmol/L — ABNORMAL LOW (ref 22–32)
Calcium: 8.8 mg/dL — ABNORMAL LOW (ref 8.9–10.3)
Chloride: 104 mmol/L (ref 98–111)
Creatinine, Ser: 0.87 mg/dL (ref 0.61–1.24)
GFR, Estimated: >60 mL/min (ref >60)
Glucose, Bld: 121 mg/dL — ABNORMAL HIGH (ref 70–99)
Potassium: 4.4 mmol/L (ref 3.5–5.1)
Sodium: 133 mmol/L — ABNORMAL LOW (ref 135–145)
Total Bilirubin: 1 mg/dL (ref 0.3–1.2)
Total Protein: 6 g/dL — ABNORMAL LOW (ref 6.5–8.1)

## 2021-07-12 LAB — URINALYSIS, ROUTINE W REFLEX MICROSCOPIC
Bilirubin Urine: NEGATIVE
Glucose, UA: NEGATIVE mg/dL
Hgb urine dipstick: NEGATIVE
Ketones, ur: NEGATIVE mg/dL
Nitrite: NEGATIVE
Protein, ur: NEGATIVE mg/dL
Specific Gravity, Urine: 1.017 (ref 1.005–1.030)
pH: 5 (ref 5.0–8.0)

## 2021-07-12 LAB — MAGNESIUM: Magnesium: 1.3 mg/dL — ABNORMAL LOW (ref 1.7–2.4)

## 2021-07-12 LAB — LIPASE, BLOOD: Lipase: 48 U/L (ref 11–51)

## 2021-07-12 MED ORDER — IOHEXOL 350 MG/ML SOLN
100.0000 mL | Freq: Once | INTRAVENOUS | Status: AC | PRN
  Administered 2021-07-12: 100 mL via INTRAVENOUS

## 2021-07-12 MED ORDER — SODIUM CHLORIDE 0.9 % IV BOLUS
1000.0000 mL | Freq: Once | INTRAVENOUS | Status: AC
  Administered 2021-07-12: 1000 mL via INTRAVENOUS

## 2021-07-12 MED ORDER — MAGNESIUM SULFATE 2 GM/50ML IV SOLN
2.0000 g | Freq: Once | INTRAVENOUS | Status: AC
  Administered 2021-07-12: 2 g via INTRAVENOUS
  Filled 2021-07-12: qty 50

## 2021-07-12 NOTE — ED Provider Notes (Signed)
[facility] EMERGENCY DEPARTMENT Provider Note   CSN: [id number] Arrival date & time: 07/12/21  1206     History Chief Complaint  Patient presents with   Diarrhea    [name] is a 80 y.o. male with a history of liver cancer HCC & cirrhosis, congestive heart failure, chronic ascites, chronic diarrhea, presenting to ED with worsening diarrhea.  The patient reports he been dealing with diarrhea for 4 to 5 years, generally can control it with loperamide at home.  However over the past 3 weeks he feels his diarrhea has significantly worsened.  He describes 5-6 watery bowel movements per day.  He says anytime he tries to drink it runs right abdomen.  He reports very poor appetite and not wanting to eat anything.  He feels that he is getting extremely weak and shaky and having difficulty walking at home.  He has been using loperamide consistently up to 3 times a day, continues having this diarrhea.  He did call his gastroenterology office [facility]) and they are able to arrange for an outpatient stool study GI panel which was negative.  However because of his continued weakening condition they advised to come to the ER.  He reports that he does have an EGD/colonoscopy scheduled in 1 month, but he and his family feel he cannot wait that long.  MR abdomen w/ contrast on 07/01/21 for restaging of HCC, limited views of the bowels, otherwise relatively stable throughout appearance of the liver.  He reports he had targeted radiation performed approximately 2 to 3 months ago.  HPI     Past Medical History:  Diagnosis Date   Benign prostatic hypertrophy    Blood transfusion without reported diagnosis    CHF (congestive heart failure) ([place])    a. EF 20-25% by echo in 11/2019   COPD (chronic obstructive pulmonary disease) ([place]) 12/15/2009   FEV1 2.30 (70%) ratio 63 no better with B2 and DLCO 18/6 (73%) corrects to 106%   Degenerative joint disease    Depression     Diverticulosis    Essential hypertension    ACE inhibitor cough   Fatty liver    Hyperlipidemia    Internal hemorrhoids    Liver cancer ([place])    Low back pain    Otitis externa 07/30/2012   Splenomegaly    Type 2 diabetes mellitus ([place])    Ulcer of foot ([place])    Right foot    Patient Active Problem List   Diagnosis Date Noted   Iron deficiency anemia due to chronic blood loss 04/19/2021   Hepatocellular carcinoma ([place]) 06/01/2020   Anxiety 33/83/2919   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[place]) 12/14/2019   Aortic atherosclerosis ([place]) 16/60/6004   Chronic systolic heart failure ([place]) 12/05/2019   Anemia 03/05/2018   GERD (gastroesophageal reflux disease) 09/30/2017   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 positive 03/29/2016   Thrombocytopenia ([place]) 12/21/2015   B12 deficiency 08/20/2015   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   Diabetic polyneuropathy associated with type 2 diabetes mellitus ([place]) 08/07/2015   Diplopia 06/05/2015   CKD (chronic kidney disease), stage III ([place]) 05/06/2015   Fatty liver 11/04/2014   Candidal balanoposthitis 06/20/2014   PCO (posterior capsular opacification) 06/19/2013   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 Off narcotics 06/05/17 due to negative UDS for opiates x2. Full history 06/12/14. Pain contract signed.  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[place]) 04/06/2007   Hyperlipidemia associated with type 2 diabetes mellitus ([place]) 04/06/2007   Essential hypertension 04/06/2007   Osteoarthritis 04/06/2007    Past Surgical History:  Procedure Laterality Date   COLONOSCOPY  2020   JMP-MAC-plenvu  (good)-polyps-recall 1 yr   IR EMBO TUMOR ORGAN ISCHEMIA INFARCT INC GUIDE ROADMAPPING  06/01/2020   IR RADIOLOGIST EVAL & MGMT  04/28/2020   IR RADIOLOGIST EVAL & MGMT  07/15/2020   IR RADIOLOGIST EVAL & MGMT  05/04/2021   NO PAST SURGERIES  1980       Family History  Problem Relation Age of Onset   Melanoma Mother    Stroke Father    CAD Brother        Premature disease   Colon polyps Neg Hx    Colon cancer Neg Hx    Esophageal cancer Neg Hx    Stomach cancer Neg Hx    Rectal cancer Neg Hx     Social History   Tobacco Use   Smoking status: Former    Packs/day: 4.00    Years: 20.00    Pack years: 80.00    Types: Cigarettes    Quit date: 09/18/1984    Years since quitting: 36.8   Smokeless tobacco: Never   Tobacco comments:    quit in 1980  Vaping Use   Vaping Use: Never used  Substance Use Topics   Alcohol use: No    Comment: no drinking since 40 years plus   Drug use: No    Home Medications Prior to Admission medications   Medication Sig Start Date End Date Taking? Authorizing Provider  acyclovir (ZOVIRAX) 400 MG tablet TAKE 1 TABLET TWICE DAILY 08/17/20   [name], MD  albuterol (VENTOLIN HFA) 108 (90 Base) MCG/ACT inhaler Inhale 2 puffs into the lungs every 6 (six) hours as needed for wheezing or shortness of breath. 11/29/19   [name], MD  ASCORBIC ACID PO Take 1 tablet by mouth daily.     [provider]  aspirin EC 81 MG tablet Take 81 mg by mouth daily.    [provider]  Blood Glucose Monitoring Suppl (TRUE METRIX AIR GLUCOSE METER) w/Device KIT  08/01/18   [provider]  busPIRone (BUSPAR) 7.5 MG tablet Take 1 tablet (7.5 mg total) by mouth 2 (two) times daily as needed. for anxiety 05/14/21   [name], MD  carvedilol (COREG) 6.25 MG tablet TAKE 1 TABLET TWICE DAILY (DOSE INCREASE) 11/25/20   Satira Sark, MD  Cholecalciferol 1000 UNITS capsule Take 1 capsule (1,000 Units total) by mouth 2 (two)  times daily. 05/19/11   Parrett, Fonnie Mu, NP  clotrimazole (LOTRIMIN) 1 % cream For athlete's feet, apply to both feet and between toes twice daily for 4 weeks. 05/15/20   Marzetta Board, DPM  clotrimazole-betamethasone (LOTRISONE) cream Apply 1 application topically 2 (two) times daily. For 7 days maximum for severe jock itch . Stop if worsening symptoms. 11/07/19   [name], MD  ENTRESTO 49-51 MG TAKE 1 TABLET TWICE DAILY 04/22/21   Satira Sark, MD  escitalopram (LEXAPRO) 5 MG tablet Take 1 tablet (5 mg total) by mouth daily. 05/14/21   [name], MD  famotidine (PEPCID)  20 MG tablet TAKE 1 TABLET TWICE DAILY 05/26/21   [name], MD  finasteride (PROSCAR) 5 MG tablet TAKE 1 TABLET EVERY DAY 05/26/21   [name], MD  furosemide (LASIX) 20 MG tablet TAKE 1 TABLET (20 MG TOTAL) BY MOUTH DAILY. 05/26/21 06/25/21  [name], MD  gabapentin (NEURONTIN) 300 MG capsule TAKE 1 CAPSULE TWICE DAILY 03/15/21   [name], MD  glucose blood (TRUE METRIX BLOOD GLUCOSE TEST) test strip TEST BLOOD SUGAR EVERY DAY 11/05/19   [name], MD  metFORMIN (GLUCOPHAGE) 500 MG tablet Take 1 tablet (500 mg total) by mouth 3 (three) times daily with meals. 12/15/20   [name], MD  Multiple Vitamin (MULTIVITAMIN) capsule Take 1 capsule by mouth daily.    [provider]  mupirocin ointment (BACTROBAN) 2 % Apply to right foot ulcer once daily and cover with dressing. 02/18/20   Marzetta Board, DPM  rosuvastatin (CRESTOR) 10 MG tablet TAKE 1 TABLET EVERY DAY 05/14/21   [name], MD  sodium chloride (MURO 128) 5 % ophthalmic solution Place 1 drop into the right eye at bedtime.    [provider]  SUPREP BOWEL PREP KIT 17.5-3.13-1.6 GM/177ML SOLN  05/13/21   [provider]  tamsulosin (FLOMAX) 0.4 MG CAPS capsule TAKE 1 CAPSULE EVERY DAY 05/26/21   [name], MD  TRUEPLUS LANCETS 28G MISC Use to test blood sugars two times  daily. Dx: E11.9 07/30/18   [name], MD    Allergies    Patient has no known allergies.  Review of Systems   Review of Systems  Constitutional:  Positive for fatigue. Negative for chills and fever.  Eyes:  Negative for pain and visual disturbance.  Respiratory:  Negative for cough and shortness of breath.   Cardiovascular:  Negative for chest pain and palpitations.  Gastrointestinal:  Positive for abdominal distention, diarrhea and nausea. Negative for abdominal pain.  Musculoskeletal:  Negative for arthralgias and joint swelling.  Neurological:  Positive for light-headedness. Negative for syncope.  All other systems reviewed and are negative.  Physical Exam Updated Vital Signs BP (!) 141/75 (BP Location: Right Arm)   Pulse (!) 101   Temp 98.6 F (37 C) (Oral)   Resp (!) 24   Ht 5' 11" (1.803 m)   Wt 89.4 kg   SpO2 95%   BMI 27.48 kg/m   Physical Exam Constitutional:      General: He is not in acute distress. HENT:     Head: Normocephalic and atraumatic.  Eyes:     Conjunctiva/sclera: Conjunctivae normal.     Pupils: Pupils are equal, round, and reactive to light.  Cardiovascular:     Rate and Rhythm: Regular rhythm. Tachycardia present.     Pulses: Normal pulses.  Pulmonary:     Effort: Pulmonary effort is normal. No respiratory distress.  Abdominal:     General: There is no distension.     Tenderness: There is no abdominal tenderness.     Comments: Ascites, fluid wave, abdomen soft, nontender  Skin:    General: Skin is warm and dry.  Neurological:     General: No focal deficit present.     Mental Status: He is alert. Mental status is at baseline.  Psychiatric:        Mood and Affect: Mood normal.        Behavior: Behavior normal.    ED Results / Procedures / Treatments  Labs (all labs ordered are listed, but only abnormal results are displayed) Labs Reviewed  CBC WITH DIFFERENTIAL/PLATELET - Abnormal; Notable for the following components:       Result Value   RBC 3.16 (*)    Hemoglobin 11.0 (*)    HCT 32.8 (*)    MCV 103.8 (*)    MCH 34.8 (*)    RDW 17.5 (*)    Platelets 97 (*)    Lymphs Abs 0.6 (*)    All other components within normal limits  COMPREHENSIVE METABOLIC PANEL - Abnormal; Notable for the following components:   Sodium 133 (*)    CO2 21 (*)    Glucose, Bld 121 (*)    Calcium 8.8 (*)    Total Protein 6.0 (*)    Albumin 3.1 (*)    All other components within normal limits  URINALYSIS, ROUTINE W REFLEX MICROSCOPIC - Abnormal; Notable for the following components:   Leukocytes,Ua SMALL (*)    Bacteria, UA RARE (*)    All other components within normal limits  MAGNESIUM - Abnormal; Notable for the following components:   Magnesium 1.3 (*)    All other components within normal limits  RESP PANEL BY RT-PCR (FLU A&B, COVID) [id number]  C DIFFICILE QUICK SCREEN W PCR REFLEX    LIPASE, BLOOD    EKG None  Radiology No results found.  Procedures Procedures   Medications Ordered in ED Medications  magnesium sulfate IVPB 2 g 50 mL (2 g Intravenous New Bag/Given 07/12/21 2309)  sodium chloride 0.9 % bolus 1,000 mL (1,000 mLs Intravenous New Bag/Given 07/12/21 2304)    ED Course  I have reviewed the triage vital signs and the nursing notes.  Pertinent labs & imaging results that were available during my care of the patient were reviewed by me and considered in my medical decision making (see chart for details).  Patient is here with acute on chronic diarrhea for 3 weeks.  He and his family are concerned about deconditioning and dehydration.  Does not appear that this is likely infectious, given that his stool studies were negative earlier this week.  However the MRI that was performed 2 weeks ago and limited views of the bowels, and I do think a CT scan with contrast may be beneficial to evaluate for colitis while he is here.  I have also ordered IV fluids as he is tachycardic, as well as IV magnesium as he does  have low magnesium levels.  Thankfully his creatinine level and his potassium are within normal limits.  Because he is not able to control the symptoms at home with loperamide, I do think he will likely require hospitalization and GI consult in the morning.  His family agrees.    I personally read his prior medical history as noted above.  Supplemental history was provided by the patient's daughter and wife at bedside.  I personally reviewed and interpreted the patient's blood work as noted above.  WBC wnl.  Hgb stable 11.0 (he has hx of chronic blood loss anemia).  Clinical Course as of 07/12/21 2312  Mon Jul 12, 2021  2308 Signed out to Dr Darl Householder pending likely obs admission for GI consult in the AM and continued IV fluids, f/u on CT imaging. [MT]    Clinical Course User Index [MT] Makayla Confer, Carola Rhine, MD   Final Clinical Impression(s) / ED Diagnoses Final diagnoses:  None    Rx / DC Orders ED Discharge Orders     None  Wyvonnia Dusky, MD 07/12/21 2312

## 2021-07-12 NOTE — ED Provider Notes (Signed)
Emergency Medicine Provider Triage Evaluation Note  [name] , a 80 y.o. male  was evaluated in triage.  Pt complains of diarrhea for 3 weeks.  States that he has a history of liver cancer.  No blood in the stool.  No vomiting.  He has mild vague abdominal pain.  States that he recently had stool testing done.   Review of Systems  Positive: Diarrhea and weakness Negative: Vomiting  Physical Exam  BP (!) 141/90 (BP Location: Right Arm)   Pulse (!) 103   Temp 98.1 F (36.7 C) (Oral)   Resp 18   SpO2 97%  Gen:   Awake, no distress   Resp:  Normal effort  MSK:   Moves extremities without difficulty  Other:  No focal abdominal pain  Medical Decision Making  Medically screening exam initiated at 1:03 PM.  Appropriate orders placed.  [name] was informed that the remainder of the evaluation will be completed by another provider, this initial triage assessment does not replace that evaluation, and the importance of remaining in the ED until their evaluation is complete.  Plan: Labs ordered.   Joshua Cater, PA-C 07/12/21 1304    Joshua Ferguson, MD 07/17/21 1041

## 2021-07-12 NOTE — ED Triage Notes (Signed)
C/o diarrhea onset 3 weeks ago c/o decreased appetite weakness states he has hx. Of liver cancer. Denies vomiting. Denies blood in stools. States he was scheduled for endo/colonoscopy however was cancelled due to cancer treatment.

## 2021-07-12 NOTE — Progress Notes (Signed)
HEMATOLOGY/ONCOLOGY CLINIC NOTE  Date of Service: 07/12/2021  Patient Care Team: [name], MD as PCP - General (Family Medicine) Satira Sark, MD as PCP - Cardiology (Cardiology) Marilynne Halsted, MD as Referring Physician (Ophthalmology) Brunetta Genera, MD as Consulting Physician (Hematology) Pyrtle, Lajuan Lines, MD as Consulting Physician (Gastroenterology) Marzetta Board, DPM as Consulting Physician (Podiatry) Madelin Rear, [facility] as Pharmacist (Pharmacist)  CHIEF COMPLAINTS/PURPOSE OF CONSULTATION:  F/u for [facility]  HISTORY OF PRESENTING ILLNESS:   [name] is a wonderful 80 y.o. 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 He is accompanied today by his wife. The pt reports that he is doing well overall.   The pt reports that he has had low energy levels from the past 3-4 years since he retired. Today he feels fatigued as well.  He has been taking Metformin for the last 10 years and has recently begun replacing Vitamin B12. He has neuropathy in his feet and has experienced some falls and uses a cane at night. He is also taking Finasteride  Most recent lab results (02/28/18) of CBC  is as follows: all values are WNL except for RBC at 3.42, HGB at 11.4, HCT at 33.5, Platelets at 127k. Testosterone 02/13/18 was lower at 185  On review of systems, pt reports neuropathy in his feet, some fatigue, stable back pain, some ankle swelling, and denies abdominal pains and any other symptoms.   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 We are joined today by his wife and daughter. The patient's last visit with [place] was on 04/14/2021. The pt reports  that he is doing well overall.  The pt reports that he tolerated his second round of radiation to his 2nd [place] lesion without any significant prohibitive toxicities.  Of note since the patient's last visit, pt has had MR Abd w wo contrast Lab results today 07/01/2021- 1. Treated lesion within segment 4A is again noted. Mildly decreased in size in the interval. Persistent rim arterial phase hyperenhancement with diminished internal enhancement compatible with residual viable tumor. 2. Unchanged size of LR-5 lesion within lateral segment of left hepatic lobe compatible with hepatocellular carcinoma. 3. Cirrhosis with splenomegaly. 4. Interval increase in volume of abdominopelvic ascites.  Patient's labs today on 07/06/2021 show mild anemia with hemoglobin of 9.9 normal WBC count of 4.4k and platelet count of 83k CMP stable, ferritin 123 with an iron saturation of 42%.  Patient notes he has been having some loose stool/diarrhea and has a follow-up appointment in a couple of days with his gastroenterologist for evaluating this further.  On review of systems, pt reports grade 1 through 2 fatigue, diarrhea.  No other acute focal new symptoms.  MEDICAL HISTORY:  Past Medical History:  Diagnosis Date   Benign prostatic hypertrophy    Blood transfusion without reported diagnosis    CHF (congestive heart failure) ([place])    a. EF 20-25% by echo in 11/2019   COPD (chronic obstructive pulmonary disease) ([place]) 12/15/2009   FEV1 2.30 (70%) ratio 63 no better with B2 and DLCO 18/6 (73%) corrects to 106%   Degenerative joint disease    Depression  Diverticulosis    Essential hypertension    ACE inhibitor cough   Fatty liver    Hyperlipidemia    Internal hemorrhoids    Liver cancer ([place])    Low back pain    Otitis externa 07/30/2012   Splenomegaly    Type 2 diabetes mellitus ([place])    Ulcer of foot (H[facility])    Right foot    SURGICAL HISTORY: Past Surgical History:  Procedure Laterality Date    COLONOSCOPY  2020   JMP-MAC-plenvu (good)-polyps-recall 1 yr   IR EMBO TUMOR ORGAN ISCHEMIA INFARCT INC GUIDE ROADMAPPING  06/01/2020   IR RADIOLOGIST EVAL & MGMT  04/28/2020   IR RADIOLOGIST EVAL & MGMT  07/15/2020   IR RADIOLOGIST EVAL & MGMT  05/04/2021   NO PAST SURGERIES  1980    SOCIAL HISTORY: Social History   Socioeconomic History   Marital status: Unknown    Spouse name: Not on file   Number of children: Not on file   Years of education: Not on file   Highest education level: Not on file  Occupational History   Occupation: retired    Comment: maintenence work  Tobacco Use   Smoking status: Former    Packs/day: 4.00    Years: 20.00    Pack years: 80.00    Types: Cigarettes    Quit date: 09/18/1984    Years since quitting: 36.8   Smokeless tobacco: Never   Tobacco comments:    quit in 1980  Vaping Use   Vaping Use: Never used  Substance and Sexual Activity   Alcohol use: No    Comment: no drinking since 40 years plus   Drug use: No   Sexual activity: Yes  Other Topics Concern   Not on file  Social History Narrative   ** Merged History Encounter **       Married 22 years in 2015. 4 kids (2 sons) and 3 grandkids.    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            Social Determinants of Radio broadcast assistant Strain: Low Risk    Difficulty of Paying Living Expenses: Not hard at all  Food Insecurity: No Food Insecurity   Worried About Charity fundraiser in the Last Year: Never true   Arboriculturist in the Last Year: Never true  Transportation Needs: No Transportation Needs   Lack of Transportation (Medical): No   Lack of Transportation (Non-Medical): No  Physical Activity: Inactive   Days of Exercise per Week: 0 days   Minutes of Exercise per Session: 0 min  Stress: No Stress Concern Present   Feeling of Stress : Not at all  Social Connections:  Moderately Integrated   Frequency of Communication with Friends and Family: More than three times a week   Frequency of Social Gatherings with Friends and Family: Twice a week   Attends Religious Services: More than 4 times per year   Active Member of [employer] or Organizations: No   Attends Archivist Meetings: Never   Marital Status: Married  Human resources officer Violence: Not At Risk   Fear of Current or Ex-Partner: No   Emotionally Abused: No   Physically Abused: No   Sexually Abused: No    FAMILY HISTORY: Family History  Problem Relation Age of Onset   Melanoma  Mother    Stroke Father    CAD Brother        Premature disease   Colon polyps Neg Hx    Colon cancer Neg Hx    Esophageal cancer Neg Hx    Stomach cancer Neg Hx    Rectal cancer Neg Hx     ALLERGIES:  has No Known Allergies.  MEDICATIONS:  No current facility-administered medications for this visit.   Current Outpatient Medications  Medication Sig Dispense Refill   acyclovir (ZOVIRAX) 400 MG tablet TAKE 1 TABLET TWICE DAILY 180 tablet 1   albuterol (VENTOLIN HFA) 108 (90 Base) MCG/ACT inhaler Inhale 2 puffs into the lungs every 6 (six) hours as needed for wheezing or shortness of breath. 6.7 g 3   ASCORBIC ACID PO Take 1 tablet by mouth daily.      aspirin EC 81 MG tablet Take 81 mg by mouth daily.     Blood Glucose Monitoring Suppl (TRUE METRIX AIR GLUCOSE METER) w/Device KIT      busPIRone (BUSPAR) 7.5 MG tablet Take 1 tablet (7.5 mg total) by mouth 2 (two) times daily as needed. for anxiety 180 tablet 3   carvedilol (COREG) 6.25 MG tablet TAKE 1 TABLET TWICE DAILY (DOSE INCREASE) 180 tablet 3   Cholecalciferol 1000 UNITS capsule Take 1 capsule (1,000 Units total) by mouth 2 (two) times daily.     clotrimazole (LOTRIMIN) 1 % cream For athlete's feet, apply to both feet and between toes twice daily for 4 weeks. 45 g 1   clotrimazole-betamethasone (LOTRISONE) cream Apply 1 application topically 2 (two) times  daily. For 7 days maximum for severe jock itch . Stop if worsening symptoms. 45 g 1   ENTRESTO 49-51 MG TAKE 1 TABLET TWICE DAILY 180 tablet 3   escitalopram (LEXAPRO) 5 MG tablet Take 1 tablet (5 mg total) by mouth daily. 90 tablet 3   famotidine (PEPCID) 20 MG tablet TAKE 1 TABLET TWICE DAILY 180 tablet 1   finasteride (PROSCAR) 5 MG tablet TAKE 1 TABLET EVERY DAY 90 tablet 1   furosemide (LASIX) 20 MG tablet TAKE 1 TABLET (20 MG TOTAL) BY MOUTH DAILY. 90 tablet 1   gabapentin (NEURONTIN) 300 MG capsule TAKE 1 CAPSULE TWICE DAILY 180 capsule 3   glucose blood (TRUE METRIX BLOOD GLUCOSE TEST) test strip TEST BLOOD SUGAR EVERY DAY 100 strip 1   metFORMIN (GLUCOPHAGE) 500 MG tablet Take 1 tablet (500 mg total) by mouth 3 (three) times daily with meals. 270 tablet 3   Multiple Vitamin (MULTIVITAMIN) capsule Take 1 capsule by mouth daily.     mupirocin ointment (BACTROBAN) 2 % Apply to right foot ulcer once daily and cover with dressing. 30 g 1   rosuvastatin (CRESTOR) 10 MG tablet TAKE 1 TABLET EVERY DAY 90 tablet 3   sodium chloride (MURO 128) 5 % ophthalmic solution Place 1 drop into the right eye at bedtime.     SUPREP BOWEL PREP KIT 17.5-3.13-1.6 GM/177ML SOLN      tamsulosin (FLOMAX) 0.4 MG CAPS capsule TAKE 1 CAPSULE EVERY DAY 90 capsule 1   TRUEPLUS LANCETS 28G MISC Use to test blood sugars two times daily. Dx: E11.9 200 each 3   Facility-Administered Medications Ordered in Other Visits  Medication Dose Route Frequency Provider Last Rate Last Admin   magnesium sulfate IVPB 2 g 50 mL  2 g Intravenous Once Wyvonnia Dusky, MD 50 mL/hr at 07/12/21 2309 2 g at 07/12/21 2309    REVIEW  OF SYSTEMS:   .10 Point review of Systems was done is negative except as noted above.   PHYSICAL EXAMINATION:  Vitals:   07/06/21 1325  BP: 126/60  Pulse: 78  Resp: 17  Temp: 97.8 F (36.6 C)  SpO2: 98%    Filed Weights   07/06/21 1325  Weight: 197 lb 4.8 oz (89.5 kg)    .Body mass index is  27.52 kg/m.  Exam was given in a chair.   GENERAL:alert, in no acute distress and comfortable SKIN: no acute rashes, no significant lesions EYES: conjunctiva are pink and non-injected, sclera anicteric OROPHARYNX: MMM, no exudates, no oropharyngeal erythema or ulceration NECK: supple, no JVD LYMPH:  no palpable lymphadenopathy in the cervical, axillary or inguinal regions LUNGS: clear to auscultation b/l with normal respiratory effort HEART: regular rate & rhythm ABDOMEN:  normoactive bowel sounds , non tender, not distended. Extremity: no pedal edema PSYCH: alert & oriented x 3 with fluent speech NEURO: no focal motor/sensory deficits  LABORATORY DATA:  I have reviewed the data as listed  CBC Latest Ref Rng & Units 07/12/2021 07/06/2021 05/06/2021  WBC 4.0 - 10.5 K/uL 5.3 4.4 5.0  Hemoglobin 13.0 - 17.0 g/dL 11.0(L) 9.9(L) 10.1(L)  Hematocrit 39.0 - 52.0 % 32.8(L) 29.8(L) 32.5(L)  Platelets 150 - 400 K/uL 97(L) 83(L) 100(L)   CBC    Component Value Date/Time   WBC 5.3 07/12/2021 1311   RBC 3.16 (L) 07/12/2021 1311   HGB 11.0 (L) 07/12/2021 1311   HGB 9.9 (L) 07/06/2021 1319   HCT 32.8 (L) 07/12/2021 1311   HCT 33.1 (L) 06/21/2018 1332   PLT 97 (L) 07/12/2021 1311   PLT 83 (L) 07/06/2021 1319   MCV 103.8 (H) 07/12/2021 1311   MCH 34.8 (H) 07/12/2021 1311   MCHC 33.5 07/12/2021 1311   RDW 17.5 (H) 07/12/2021 1311   LYMPHSABS 0.6 (L) 07/12/2021 1311   MONOABS 0.4 07/12/2021 1311   EOSABS 0.1 07/12/2021 1311   BASOSABS 0.0 07/12/2021 1311    CMP Latest Ref Rng & Units 07/12/2021 07/06/2021 05/06/2021  Glucose 70 - 99 mg/dL 121(H) 120(H) 124(H)  BUN 8 - 23 mg/dL _0 Creatinine 0.61 - 1.24 mg/dL 0.87 0.98 1.27(H)  Sodium 135 - 145 mmol/L 133(L) 137 138  Potassium 3.5 - 5.1 mmol/L 4.4 4.9 4.6  Chloride 98 - 111 mmol/L 104 106 104  CO2 22 - 32 mmol/L 21(L) 22 27  Calcium 8.9 - 10.3 mg/dL 8.8(L) 8.9 9.1  Total Protein 6.5 - 8.1 g/dL 6.0(L) 6.0(L) -  Total Bilirubin  0.3 - 1.2 mg/dL 1.0 0.9 -  Alkaline Phos 38 - 126 U/L 82 103 -  AST 15 - 41 U/L 30 32 -  ALT 0 - 44 U/L 16 15 -   Component     Latest Ref Rng & Units 06/21/2018  Folate, Hemolysate     Not Estab. ng/mL >620.0  HCT     37.5 - 51.0 % 33.1 (L)  Folate, RBC     >498 ng/mL >1,873  Methylmalonic Acid, Quantitative     0 - 378 nmol/L 294  DISCLAIMER      Comment  Specimen Type      [place]  Lead-Whole Blood     0 - 4 ug/dL 2  Vitamin B12     180 - 914 pg/mL 836  Homocysteine     0.0 - 15.0 umol/L 11.2    02/28/18 Surgical Pathology:   02/28/18 Cytogenetics:  RADIOGRAPHIC STUDIES: I have personally reviewed the radiological images as listed and agreed with the findings in the report. MR Abdomen W Wo Contrast  Result Date: 07/03/2021 CLINICAL DATA:  Restaging hepatocellular carcinoma. EXAM: MRI ABDOMEN WITHOUT AND WITH CONTRAST TECHNIQUE: Multiplanar multisequence MR imaging of the abdomen was performed both before and after the administration of intravenous contrast. CONTRAST:  47m GADAVIST GADOBUTROL 1 MMOL/ML IV SOLN COMPARISON:  MRI 03/23/2021 FINDINGS: Lower chest: No acute findings. Hepatobiliary: The liver has a cirrhotic appearance. Treated lesion within segment 4A is again seen. On today's study this measures 2.1 x 1.7 cm, image 23/17. Formally 2.8 x 2.2 cm. Persistent rim arterial hyperenhancement with diminished internal enhancement. Surrounding fibrosis with volume loss is again noted. The arterial phase enhancing lesion within lateral segment of left hepatic lobe measures 1.6 x 1.5 cm, image 30/17. Unchanged in size from previous exam. This has a pseudo capsule on the portal venous phase images with delayed internal washout compatible with LR-5 lesion. Status post cholecystectomy. Postoperative bile duct dilatation is again noted. Pancreas: No mass, inflammatory changes, or other parenchymal abnormality identified. Spleen: Spleen is enlarged measuring 15 cm in cranial caudal  dimension. Formally this measured the same. No focal splenic abnormality. Adrenals/Urinary Tract: Normal adrenal glands. Bilateral renal cortical scarring. Cyst noted within posterior cortex of interpolar right kidney measuring 1.2 cm. Stomach/Bowel: Visualized portions within the abdomen are unremarkable. Vascular/Lymphatic: Normal caliber abdominal aorta. No abdominal adenopathy. Other:  Interval increase in volume of abdominopelvic ascites. Musculoskeletal: No suspicious bone lesions identified. IMPRESSION: 1. Treated lesion within segment 4A is again noted. Mildly decreased in size in the interval. Persistent rim arterial phase hyperenhancement with diminished internal enhancement compatible with residual viable tumor. 2. Unchanged size of LR-5 lesion within lateral segment of left hepatic lobe compatible with hepatocellular carcinoma. 3. Cirrhosis with splenomegaly. 4. Interval increase in volume of abdominopelvic ascites. Electronically Signed   By: TKerby MoorsM.D.   On: 07/03/2021 13:00     ASSESSMENT & PLAN:   80y.o. male with  1. Thrombocytopenia  Mild Plt 127k with minimal normocytic anemia. 02/28/18 surgical pathology report with no overt pathology, could not rule out MDS, and noted an increase in ring sideroblasts 02/28/18 BM cytogenetics report was unremarkable  2. 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 Recently Diagnosed Hepatocellular Carcinoma -- likely related to liver cirrhosis -02/14/2020 CT Abd/Pel ([id number] revealed "1. 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 2. 4 cm hypervascular mass in segment 4A of left hepatic lobe, and 8 mm hypervascular nodule in posterior right hepatic lobe. These are highly suspicious for hepatocellular carcinomas in the setting of cirrhosis. 3. No evidence of metastatic disease." -03/27/2020 MRI Abdomen ([id number] revealed "1. The dominant lesion of concern in the medial segment of the left hepatic lobe  (segment 4 A) is diagnostic of hepatocellular carcinoma (LR 5). 2. The other small lesion questioned inferiorly in the right lobe is not clearly seen, although evaluation of this area is mildly limited by motion artifact."  PLAN: -Discussed pt labwork, 07/02/2021- labs -Discussed pt MR Abd w wo contrast  - IMPRESSION: 1. Treated lesion within segment 4A is again noted. Mildly decreased in size in the interval. Persistent rim arterial phase hyperenhancement with diminished internal enhancement compatible with residual viable tumor. 2. Unchanged size of LR-5 lesion within lateral segment of left hepatic lobe compatible with hepatocellular carcinoma. 3. Cirrhosis with splenomegaly. 4. Interval increase in volume of abdominopelvic ascites.   Continue follow-up  with radiation oncology -Follow-up with gastroenterology for evaluation and management of his liver cirrhosis and splenomegaly as well as diuretic therapy. -Advised pt we will continue as current plan at this time.  -No lab or clinical evidence of Hepatocellular Carcinoma progression at this time. -Will get labs today and stool testing.  MRI Abd in 13 weeks (1st week of Jan 2022) RTC with Dr Irene Limbo with labs in 14 weeks   FOLLOW UP: Labs today  . The total time spent in the appointment was 25 minutes and more than 50% was on counseling and direct patient cares.   All of the patient's questions were answered with apparent satisfaction. The patient knows to call the clinic with any problems, questions or concerns.  Brunetta Genera MD

## 2021-07-13 DIAGNOSIS — R630 Anorexia: Secondary | ICD-10-CM | POA: Diagnosis not present

## 2021-07-13 DIAGNOSIS — N390 Urinary tract infection, site not specified: Secondary | ICD-10-CM | POA: Diagnosis present

## 2021-07-13 DIAGNOSIS — R531 Weakness: Secondary | ICD-10-CM

## 2021-07-13 DIAGNOSIS — E785 Hyperlipidemia, unspecified: Secondary | ICD-10-CM | POA: Diagnosis present

## 2021-07-13 DIAGNOSIS — J449 Chronic obstructive pulmonary disease, unspecified: Secondary | ICD-10-CM

## 2021-07-13 DIAGNOSIS — K529 Noninfective gastroenteritis and colitis, unspecified: Secondary | ICD-10-CM | POA: Diagnosis present

## 2021-07-13 DIAGNOSIS — R509 Fever, unspecified: Secondary | ICD-10-CM | POA: Diagnosis not present

## 2021-07-13 DIAGNOSIS — I8510 Secondary esophageal varices without bleeding: Secondary | ICD-10-CM | POA: Diagnosis not present

## 2021-07-13 DIAGNOSIS — K3189 Other diseases of stomach and duodenum: Secondary | ICD-10-CM | POA: Diagnosis present

## 2021-07-13 DIAGNOSIS — D72819 Decreased white blood cell count, unspecified: Secondary | ICD-10-CM | POA: Diagnosis not present

## 2021-07-13 DIAGNOSIS — I5022 Chronic systolic (congestive) heart failure: Secondary | ICD-10-CM | POA: Diagnosis present

## 2021-07-13 DIAGNOSIS — K909 Intestinal malabsorption, unspecified: Secondary | ICD-10-CM | POA: Diagnosis present

## 2021-07-13 DIAGNOSIS — K766 Portal hypertension: Secondary | ICD-10-CM | POA: Diagnosis not present

## 2021-07-13 DIAGNOSIS — R634 Abnormal weight loss: Secondary | ICD-10-CM | POA: Diagnosis not present

## 2021-07-13 DIAGNOSIS — D61818 Other pancytopenia: Secondary | ICD-10-CM | POA: Diagnosis not present

## 2021-07-13 DIAGNOSIS — E440 Moderate protein-calorie malnutrition: Secondary | ICD-10-CM | POA: Diagnosis not present

## 2021-07-13 DIAGNOSIS — R197 Diarrhea, unspecified: Secondary | ICD-10-CM | POA: Diagnosis not present

## 2021-07-13 DIAGNOSIS — D509 Iron deficiency anemia, unspecified: Secondary | ICD-10-CM | POA: Diagnosis present

## 2021-07-13 DIAGNOSIS — E538 Deficiency of other specified B group vitamins: Secondary | ICD-10-CM | POA: Diagnosis present

## 2021-07-13 DIAGNOSIS — N183 Chronic kidney disease, stage 3 unspecified: Secondary | ICD-10-CM | POA: Diagnosis not present

## 2021-07-13 DIAGNOSIS — F1011 Alcohol abuse, in remission: Secondary | ICD-10-CM | POA: Diagnosis present

## 2021-07-13 DIAGNOSIS — Z20822 Contact with and (suspected) exposure to covid-19: Secondary | ICD-10-CM | POA: Diagnosis present

## 2021-07-13 DIAGNOSIS — R188 Other ascites: Secondary | ICD-10-CM | POA: Diagnosis present

## 2021-07-13 DIAGNOSIS — K2900 Acute gastritis without bleeding: Secondary | ICD-10-CM | POA: Diagnosis not present

## 2021-07-13 DIAGNOSIS — E1142 Type 2 diabetes mellitus with diabetic polyneuropathy: Secondary | ICD-10-CM | POA: Diagnosis present

## 2021-07-13 DIAGNOSIS — K449 Diaphragmatic hernia without obstruction or gangrene: Secondary | ICD-10-CM | POA: Diagnosis not present

## 2021-07-13 DIAGNOSIS — K7460 Unspecified cirrhosis of liver: Secondary | ICD-10-CM | POA: Diagnosis not present

## 2021-07-13 DIAGNOSIS — C220 Liver cell carcinoma: Secondary | ICD-10-CM | POA: Diagnosis not present

## 2021-07-13 DIAGNOSIS — N1831 Chronic kidney disease, stage 3a: Secondary | ICD-10-CM | POA: Diagnosis present

## 2021-07-13 DIAGNOSIS — J189 Pneumonia, unspecified organism: Secondary | ICD-10-CM | POA: Diagnosis not present

## 2021-07-13 DIAGNOSIS — E871 Hypo-osmolality and hyponatremia: Secondary | ICD-10-CM | POA: Diagnosis not present

## 2021-07-13 DIAGNOSIS — I130 Hypertensive heart and chronic kidney disease with heart failure and stage 1 through stage 4 chronic kidney disease, or unspecified chronic kidney disease: Secondary | ICD-10-CM | POA: Diagnosis not present

## 2021-07-13 DIAGNOSIS — E1122 Type 2 diabetes mellitus with diabetic chronic kidney disease: Secondary | ICD-10-CM | POA: Diagnosis present

## 2021-07-13 DIAGNOSIS — E860 Dehydration: Secondary | ICD-10-CM | POA: Diagnosis present

## 2021-07-13 LAB — COMPREHENSIVE METABOLIC PANEL
ALT: 13 U/L (ref 0–44)
AST: 27 U/L (ref 15–41)
Albumin: 2.7 g/dL — ABNORMAL LOW (ref 3.5–5.0)
Alkaline Phosphatase: 82 U/L (ref 38–126)
Anion gap: 7 (ref 5–15)
BUN: 11 mg/dL (ref 8–23)
CO2: 22 mmol/L (ref 22–32)
Calcium: 8.4 mg/dL — ABNORMAL LOW (ref 8.9–10.3)
Chloride: 104 mmol/L (ref 98–111)
Creatinine, Ser: 0.86 mg/dL (ref 0.61–1.24)
GFR, Estimated: >60 mL/min (ref >60)
Glucose, Bld: 94 mg/dL (ref 70–99)
Potassium: 4.2 mmol/L (ref 3.5–5.1)
Sodium: 133 mmol/L — ABNORMAL LOW (ref 135–145)
Total Bilirubin: 1.1 mg/dL (ref 0.3–1.2)
Total Protein: 5.3 g/dL — ABNORMAL LOW (ref 6.5–8.1)

## 2021-07-13 LAB — CBC WITH DIFFERENTIAL/PLATELET
Abs Immature Granulocytes: 0.01 10*3/uL (ref 0.00–0.07)
Basophils Absolute: 0 10*3/uL (ref 0.0–0.1)
Basophils Relative: 1 %
Eosinophils Absolute: 0.1 10*3/uL (ref 0.0–0.5)
Eosinophils Relative: 2 %
HCT: 28.4 % — ABNORMAL LOW (ref 39.0–52.0)
Hemoglobin: 9.7 g/dL — ABNORMAL LOW (ref 13.0–17.0)
Immature Granulocytes: 0 %
Lymphocytes Relative: 20 %
Lymphs Abs: 0.8 10*3/uL (ref 0.7–4.0)
MCH: 35.1 pg — ABNORMAL HIGH (ref 26.0–34.0)
MCHC: 34.2 g/dL (ref 30.0–36.0)
MCV: 102.9 fL — ABNORMAL HIGH (ref 80.0–100.0)
Monocytes Absolute: 0.4 10*3/uL (ref 0.1–1.0)
Monocytes Relative: 9 %
Neutro Abs: 2.6 10*3/uL (ref 1.7–7.7)
Neutrophils Relative %: 68 %
Platelets: 81 10*3/uL — ABNORMAL LOW (ref 150–400)
RBC: 2.76 MIL/uL — ABNORMAL LOW (ref 4.22–5.81)
RDW: 18 % — ABNORMAL HIGH (ref 11.5–15.5)
WBC: 3.9 10*3/uL — ABNORMAL LOW (ref 4.0–10.5)
nRBC: 0 % (ref 0.0–0.2)

## 2021-07-13 LAB — MAGNESIUM: Magnesium: 1.9 mg/dL (ref 1.7–2.4)

## 2021-07-13 LAB — RESP PANEL BY RT-PCR (FLU A&B, COVID) ARPGX2
Influenza A by PCR: NEGATIVE
Influenza B by PCR: NEGATIVE
SARS Coronavirus 2 by RT PCR: NEGATIVE

## 2021-07-13 LAB — HEMOGLOBIN A1C
Hgb A1c MFr Bld: 5.2 % (ref 4.8–5.6)
Mean Plasma Glucose: 102.54 mg/dL

## 2021-07-13 LAB — CBG MONITORING, ED
Glucose-Capillary: 96 mg/dL (ref 70–99)
Glucose-Capillary: 124 mg/dL — ABNORMAL HIGH (ref 70–99)
Glucose-Capillary: 157 mg/dL — ABNORMAL HIGH (ref 70–99)

## 2021-07-13 MED ORDER — ACETAMINOPHEN 325 MG PO TABS
650.0000 mg | ORAL_TABLET | Freq: Four times a day (QID) | ORAL | Status: DC | PRN
  Administered 2021-07-15 – 2021-07-18 (×7): 650 mg via ORAL
  Filled 2021-07-13 (×7): qty 2

## 2021-07-13 MED ORDER — INSULIN ASPART 100 UNIT/ML IJ SOLN
0.0000 [IU] | Freq: Three times a day (TID) | INTRAMUSCULAR | Status: DC
  Administered 2021-07-14 – 2021-07-15 (×2): 1 [IU] via SUBCUTANEOUS
  Administered 2021-07-15: 2 [IU] via SUBCUTANEOUS
  Administered 2021-07-15: 3 [IU] via SUBCUTANEOUS
  Administered 2021-07-16: 1 [IU] via SUBCUTANEOUS
  Administered 2021-07-16: 2 [IU] via SUBCUTANEOUS
  Administered 2021-07-16 – 2021-07-18 (×5): 1 [IU] via SUBCUTANEOUS
  Administered 2021-07-19: 2 [IU] via SUBCUTANEOUS

## 2021-07-13 MED ORDER — ASPIRIN EC 81 MG PO TBEC
81.0000 mg | DELAYED_RELEASE_TABLET | Freq: Every day | ORAL | Status: DC
  Administered 2021-07-13 – 2021-07-19 (×7): 81 mg via ORAL
  Filled 2021-07-13 (×7): qty 1

## 2021-07-13 MED ORDER — PEG-KCL-NACL-NASULF-NA ASC-C 100 G PO SOLR
0.5000 | Freq: Once | ORAL | Status: AC
  Administered 2021-07-14: 100 g via ORAL

## 2021-07-13 MED ORDER — ACYCLOVIR 400 MG PO TABS
400.0000 mg | ORAL_TABLET | Freq: Two times a day (BID) | ORAL | Status: DC
  Administered 2021-07-13 – 2021-07-19 (×12): 400 mg via ORAL
  Filled 2021-07-13 (×14): qty 1

## 2021-07-13 MED ORDER — ONDANSETRON HCL 4 MG/2ML IJ SOLN: 4.0000 mg | Freq: Four times a day (QID) | INTRAMUSCULAR | Status: DC | PRN

## 2021-07-13 MED ORDER — PEG-KCL-NACL-NASULF-NA ASC-C 100 G PO SOLR: 1.0000 | Freq: Once | ORAL | Status: DC

## 2021-07-13 MED ORDER — LACTATED RINGERS IV SOLN: INTRAVENOUS | Status: DC

## 2021-07-13 MED ORDER — SACUBITRIL-VALSARTAN 49-51 MG PO TABS
1.0000 | ORAL_TABLET | Freq: Two times a day (BID) | ORAL | Status: DC
  Administered 2021-07-13 – 2021-07-14 (×2): 1 via ORAL
  Filled 2021-07-13 (×4): qty 1

## 2021-07-13 MED ORDER — ACETAMINOPHEN 650 MG RE SUPP: 650.0000 mg | Freq: Four times a day (QID) | RECTAL | Status: DC | PRN

## 2021-07-13 MED ORDER — CARVEDILOL 6.25 MG PO TABS
6.2500 mg | ORAL_TABLET | Freq: Two times a day (BID) | ORAL | Status: DC
  Administered 2021-07-13 – 2021-07-19 (×13): 6.25 mg via ORAL
  Filled 2021-07-13 (×3): qty 1
  Filled 2021-07-13: qty 2
  Filled 2021-07-13 (×3): qty 1
  Filled 2021-07-13: qty 2
  Filled 2021-07-13 (×5): qty 1

## 2021-07-13 MED ORDER — TAMSULOSIN HCL 0.4 MG PO CAPS
0.4000 mg | ORAL_CAPSULE | Freq: Every day | ORAL | Status: DC
  Administered 2021-07-13 – 2021-07-19 (×7): 0.4 mg via ORAL
  Filled 2021-07-13 (×7): qty 1

## 2021-07-13 MED ORDER — PEG-KCL-NACL-NASULF-NA ASC-C 100 G PO SOLR
0.5000 | Freq: Once | ORAL | Status: AC
  Administered 2021-07-13: 100 g via ORAL
  Filled 2021-07-13: qty 1

## 2021-07-13 MED ORDER — GABAPENTIN 300 MG PO CAPS
300.0000 mg | ORAL_CAPSULE | Freq: Two times a day (BID) | ORAL | Status: DC
  Administered 2021-07-13 – 2021-07-19 (×12): 300 mg via ORAL
  Filled 2021-07-13 (×12): qty 1

## 2021-07-13 MED ORDER — ONDANSETRON HCL 4 MG PO TABS: 4.0000 mg | ORAL_TABLET | Freq: Four times a day (QID) | ORAL | Status: DC | PRN

## 2021-07-13 MED ORDER — INSULIN ASPART 100 UNIT/ML IJ SOLN
0.0000 [IU] | Freq: Every day | INTRAMUSCULAR | Status: DC
  Administered 2021-07-17: 2 [IU] via SUBCUTANEOUS

## 2021-07-13 MED ORDER — FINASTERIDE 5 MG PO TABS
5.0000 mg | ORAL_TABLET | Freq: Every day | ORAL | Status: DC
  Administered 2021-07-13 – 2021-07-19 (×7): 5 mg via ORAL
  Filled 2021-07-13 (×7): qty 1

## 2021-07-13 MED ORDER — ESCITALOPRAM OXALATE 10 MG PO TABS
5.0000 mg | ORAL_TABLET | Freq: Every day | ORAL | Status: DC
  Administered 2021-07-13 – 2021-07-19 (×7): 5 mg via ORAL
  Filled 2021-07-13 (×7): qty 1

## 2021-07-13 NOTE — Assessment & Plan Note (Signed)
Assign to observation. No significant electrolyte abnormalities despite reported 4 weeks of diarrhea(5-7 times per day). Has had negative GI panel at the end of September 2022. Pt states stopping his metformin had no effect on his diarrhea.  Suprisingly, pt states that since 4 pm on 07-12-2021 while in the waiting room he has not had anymore diarrhea. Pt's labs do not show any lab evidence of dehydration. Pt does not have any tense ascites that needs to be drained. Pt is lying flat on ER gurney and not having any difficulty breathing.

## 2021-07-13 NOTE — Progress Notes (Signed)
Patient seen and examined.  He is in the emergency room.  Waiting for inpatient bed.  Admitted by nighttime hospitalist early morning hours.  History and physical reviewed and agreed.  Seen by gastroenterology.  Plan for EGD colonoscopy tomorrow. Patient tells me he is afraid to eat anything as he has to go to bathroom.  He has not eaten any meal since coming to the emergency room because he does not have a toilet in the emergency room.  I encouraged him to eat and work with therapist.  In brief 80 year old gentleman with thrombocytopenia, type 2 diabetes, anemia, B12 since he, hepatocellular carcinoma treated with SBRT who is coming to the emergency room with at least 1 month of unrelenting watery diarrhea.  Following up with gastroenterology as outpatient.  Recent C. difficile and GI pathogen panel negative.  On arrival, hemodynamically stable.  On room air.  Renal functions are stable.  Due to significant symptoms, he will be admitted to hospital.  Will need inpatient GI evaluation, will benefit with endoluminal evaluation including EGD colonoscopy and biopsies.  Further management as per gastroenterology.  No charge visit.

## 2021-07-13 NOTE — Assessment & Plan Note (Signed)
Stable

## 2021-07-13 NOTE — H&P (Signed)
History and Physical    [name] NIO:[id number] DOB: 02/18/1941 DOA: 07/12/2021  PCP: [name], MD   Patient coming from: Home  I have personally briefly reviewed patient's old medical records in [place]  CC: weakness. HPI: 80 year old white male with a history of hepatocellular carcinoma, diabetes type 2, CKD stage III, COPD, interstitial lung disease, hypertension who presents to the ER today with a 4-week history of chronic diarrhea.  Patient has been seen by GI.  Patient's has had an outpatient GI diarrhea panel performed which was negative for infectious cause of diarrhea.  Patient states his metformin was also discontinued temporarily which had no effect on his diarrhea.  Patient states that he is having 5-7 episodes of diarrhea per day.  Patient states that he is wearing adult undergarments.  He states that he is having fecal incontinence.  Patient feels dehydrated.  Patient was urged to come to the ER by his family earlier this week but declined.  Patient presented to the ER today after urging By his family.  On arrival to the ER temp was 98.1.  Heart rate is 103.  Blood pressure 141/90.  Room air saturations 97%.  Laboratory evaluation White count 5.3 hemoglobin 1.0 platelets of 97.  Magnesium 1.3.  Sodium 133, serum bicarbonate 21, BUN of 10 creatinine 0.87, total protein 6.0, albumin 3.1.  COVID-negative.  Urinalysis specific gravity 1.017.  Small leukocyte esterase rare bacteria.  Negative protein negative nitrites.  CT abdomen pelvis showed ascites.  Patient also had his known hepatic lesions that appeared stable from his prior MRI.  Patient given 2 g of magnesium along with 1000 cc normal saline.  Patient still felt weak.  ER provider asked [facility] hospitalist to admit the patient overnight for observation.   ED Course: no significant evidence of dehydration on labs. No tense ascites noted on physical exam.  Review of Systems:  Review of Systems   Constitutional:  Positive for malaise/fatigue. Negative for chills and fever.  HENT: Negative.    Eyes: Negative.   Respiratory: Negative.    Cardiovascular: Negative.   Gastrointestinal:  Positive for diarrhea.       Reports 5-7 episodes of diarrhea per day for the last 4 months. However, no diarrhea since 4 pm on 07-12-2021  Genitourinary: Negative.   Musculoskeletal: Negative.   Skin: Negative.   Neurological:  Positive for weakness.  Endo/Heme/Allergies: Negative.   Psychiatric/Behavioral: Negative.    All other systems reviewed and are negative.  Past Medical History:  Diagnosis Date   Benign prostatic hypertrophy    Blood transfusion without reported diagnosis    CHF (congestive heart failure) ([place])    a. EF 20-25% by echo in 11/2019   COPD (chronic obstructive pulmonary disease) ([place]) 12/15/2009   FEV1 2.30 (70%) ratio 63 no better with B2 and DLCO 18/6 (73%) corrects to 106%   Degenerative joint disease    Depression    Diverticulosis    Essential hypertension    ACE inhibitor cough   Fatty liver    Hyperlipidemia    Internal hemorrhoids    Liver cancer ([place])    Low back pain    Otitis externa 07/30/2012   Splenomegaly    Type 2 diabetes mellitus (HCC)    Ulcer of foot ([place])    Right foot    Past Surgical History:  Procedure Laterality Date   COLONOSCOPY  2020   JMP-MAC-plenvu (good)-polyps-recall 1 yr   IR EMBO TUMOR ORGAN ISCHEMIA INFARCT  INC GUIDE ROADMAPPING  06/01/2020   IR RADIOLOGIST EVAL & MGMT  04/28/2020   IR RADIOLOGIST EVAL & MGMT  07/15/2020   IR RADIOLOGIST EVAL & MGMT  05/04/2021   NO PAST SURGERIES  1980     reports that he quit smoking about 36 years ago. His smoking use included cigarettes. He has a 80.00 pack-year smoking history. He has never used smokeless tobacco. He reports that he does not drink alcohol and does not use drugs.  No Known Allergies  Family History  Problem Relation Age of Onset   Melanoma Mother    Stroke Father     CAD Brother        Premature disease   Colon polyps Neg Hx    Colon cancer Neg Hx    Esophageal cancer Neg Hx    Stomach cancer Neg Hx    Rectal cancer Neg Hx     Prior to Admission medications   Medication Sig Start Date End Date Taking? Authorizing Provider  acyclovir (ZOVIRAX) 400 MG tablet TAKE 1 TABLET TWICE DAILY 08/17/20   [name], MD  albuterol (VENTOLIN HFA) 108 (90 Base) MCG/ACT inhaler Inhale 2 puffs into the lungs every 6 (six) hours as needed for wheezing or shortness of breath. 11/29/19   [name], MD  ASCORBIC ACID PO Take 1 tablet by mouth daily.     [provider]  aspirin EC 81 MG tablet Take 81 mg by mouth daily.    [provider]  Blood Glucose Monitoring Suppl (TRUE METRIX AIR GLUCOSE METER) w/Device KIT  08/01/18   [provider]  busPIRone (BUSPAR) 7.5 MG tablet Take 1 tablet (7.5 mg total) by mouth 2 (two) times daily as needed. for anxiety 05/14/21   [name], MD  carvedilol (COREG) 6.25 MG tablet TAKE 1 TABLET TWICE DAILY (DOSE INCREASE) 11/25/20   Satira Sark, MD  Cholecalciferol 1000 UNITS capsule Take 1 capsule (1,000 Units total) by mouth 2 (two) times daily. 05/19/11   Parrett, Fonnie Mu, NP  clotrimazole (LOTRIMIN) 1 % cream For athlete's feet, apply to both feet and between toes twice daily for 4 weeks. 05/15/20   Marzetta Board, DPM  clotrimazole-betamethasone (LOTRISONE) cream Apply 1 application topically 2 (two) times daily. For 7 days maximum for severe jock itch . Stop if worsening symptoms. 11/07/19   [name], MD  ENTRESTO 49-51 MG TAKE 1 TABLET TWICE DAILY 04/22/21   Satira Sark, MD  escitalopram (LEXAPRO) 5 MG tablet Take 1 tablet (5 mg total) by mouth daily. 05/14/21   [name], MD  famotidine (PEPCID) 20 MG tablet TAKE 1 TABLET TWICE DAILY 05/26/21   [name], MD  finasteride (PROSCAR) 5 MG tablet TAKE 1 TABLET EVERY DAY 05/26/21   [name], MD   furosemide (LASIX) 20 MG tablet TAKE 1 TABLET (20 MG TOTAL) BY MOUTH DAILY. 05/26/21 06/25/21  [name], MD  gabapentin (NEURONTIN) 300 MG capsule TAKE 1 CAPSULE TWICE DAILY 03/15/21   [name], MD  glucose blood (TRUE METRIX BLOOD GLUCOSE TEST) test strip TEST BLOOD SUGAR EVERY DAY 11/05/19   [name], MD  metFORMIN (GLUCOPHAGE) 500 MG tablet Take 1 tablet (500 mg total) by mouth 3 (three) times daily with meals. 12/15/20   [name], MD  Multiple Vitamin (MULTIVITAMIN) capsule Take 1 capsule by mouth daily.    [provider]  mupirocin ointment (BACTROBAN) 2 % Apply  to right foot ulcer once daily and cover with dressing. 02/18/20   Marzetta Board, DPM  rosuvastatin (CRESTOR) 10 MG tablet TAKE 1 TABLET EVERY DAY 05/14/21   [name], MD  sodium chloride (MURO 128) 5 % ophthalmic solution Place 1 drop into the right eye at bedtime.    [provider]  SUPREP BOWEL PREP KIT 17.5-3.13-1.6 GM/177ML SOLN  05/13/21   [provider]  tamsulosin (FLOMAX) 0.4 MG CAPS capsule TAKE 1 CAPSULE EVERY DAY 05/26/21   [name], MD  TRUEPLUS LANCETS 28G MISC Use to test blood sugars two times daily. Dx: E11.9 07/30/18   [name], MD    Physical Exam: Vitals:   07/12/21 1648 07/12/21 1953 07/12/21 2221 07/12/21 2300  BP: 124/73 126/78 (!) 141/75 114/77  Pulse: (!) 116 (!) 109 (!) 101 85  Resp: 17 20 (!) 24 (!) 21  Temp:  98.6 F (37 C)    TempSrc:  Oral    SpO2: 98% 99% 95% 95%  Weight:      Height:        Physical Exam Vitals and nursing note reviewed.  Constitutional:      General: He is not in acute distress.    Appearance: He is not toxic-appearing or diaphoretic.     Comments: Chronically ill appearing male  HENT:     Head: Normocephalic and atraumatic.     Nose: Nose normal. No rhinorrhea.  Eyes:     General:        Right eye: No discharge.        Left eye: No discharge.  Cardiovascular:     Rate and  Rhythm: Normal rate and regular rhythm.     Pulses: Normal pulses.  Pulmonary:     Effort: Pulmonary effort is normal. No respiratory distress.     Breath sounds: No wheezing or rales.  Abdominal:     General: Bowel sounds are normal.     Palpations: Abdomen is soft.     Tenderness: There is no abdominal tenderness. There is no guarding or rebound.     Comments: No tense ascites noted  Musculoskeletal:     Right lower leg: No edema.     Left lower leg: No edema.  Skin:    General: Skin is warm and dry.     Capillary Refill: Capillary refill takes less than 2 seconds.  Neurological:     General: No focal deficit present.     Mental Status: He is alert and oriented to person, place, and time.     Labs on Admission: I have personally reviewed following labs and imaging studies  CBC: Recent Labs  Lab 07/06/21 1319 07/12/21 1311  WBC 4.4 5.3  NEUTROABS 3.5 4.2  HGB 9.9* 11.0*  HCT 29.8* 32.8*  MCV 102.4* 103.8*  PLT 83* 97*   Basic Metabolic Panel: Recent Labs  Lab 07/06/21 1319 07/12/21 1311  NA 137 133*  K 4.9 4.4  CL 106 104  CO2 22 21*  GLUCOSE 120* 121*  BUN 14 10  CREATININE 0.98 0.87  CALCIUM 8.9 8.8*  MG  --  1.3*   GFR: Estimated Creatinine Clearance: 72.1 mL/min (by C-G formula based on SCr of 0.87 mg/dL). Liver Function Tests: Recent Labs  Lab 07/06/21 1319 07/12/21 1311  AST 32 30  ALT 15 16  ALKPHOS 103 82  BILITOT 0.9 1.0  PROT 6.0* 6.0*  ALBUMIN 3.2* 3.1*   Recent Labs  Lab 07/12/21  1311  LIPASE 48   No results for input(s): AMMONIA in the last 168 hours. Coagulation Profile: No results for input(s): INR, PROTIME in the last 168 hours. Cardiac Enzymes: No results for input(s): CKTOTAL, CKMB, CKMBINDEX, TROPONINI in the last 168 hours. BNP (last 3 results) No results for input(s): PROBNP in the last 8760 hours. HbA1C: No results for input(s): HGBA1C in the last 72 hours. CBG: No results for input(s): GLUCAP in the last 168  hours. Lipid Profile: No results for input(s): CHOL, HDL, LDLCALC, TRIG, CHOLHDL, LDLDIRECT in the last 72 hours. Thyroid Function Tests: No results for input(s): TSH, T4TOTAL, FREET4, T3FREE, THYROIDAB in the last 72 hours. Anemia Panel: No results for input(s): VITAMINB12, FOLATE, FERRITIN, TIBC, IRON, RETICCTPCT in the last 72 hours. Urine analysis:    Component Value Date/Time   COLORURINE YELLOW 07/12/2021 1303   APPEARANCEUR CLEAR 07/12/2021 1303   LABSPEC 1.017 07/12/2021 1303   PHURINE 5.0 07/12/2021 1303   GLUCOSEU NEGATIVE 07/12/2021 1303   GLUCOSEU NEGATIVE 10/22/2018 1451   HGBUR NEGATIVE 07/12/2021 1303   HGBUR negative 05/14/2008 1032   BILIRUBINUR NEGATIVE 07/12/2021 1303   BILIRUBINUR neg 05/15/2017 1330   KETONESUR NEGATIVE 07/12/2021 1303   PROTEINUR NEGATIVE 07/12/2021 1303   UROBILINOGEN 0.2 10/22/2018 1451   NITRITE NEGATIVE 07/12/2021 1303   LEUKOCYTESUR SMALL (A) 07/12/2021 1303    Radiological Exams on Admission: I have personally reviewed images CT ABDOMEN PELVIS W CONTRAST  Result Date: 07/12/2021 CLINICAL DATA:  Worsening diarrhea with abdominal pain, initial encounter EXAM: CT ABDOMEN AND PELVIS WITH CONTRAST TECHNIQUE: Multidetector CT imaging of the abdomen and pelvis was performed using the standard protocol following bolus administration of intravenous contrast. CONTRAST:  176m OMNIPAQUE IOHEXOL 350 MG/ML SOLN COMPARISON:  07/01/2021 MRI FINDINGS: Lower chest: Lung bases are free of acute infiltrate or sizable effusion. Herniation of some omental fat is noted through the esophageal hiatus. No definitive herniation of the stomach is seen. Hepatobiliary: Liver is well visualized. Postsurgical changes are noted. Fiducial markings are again noted. Prior treated lesion is again seen and stable. The left lobe lesion marked by the fiducial markers is less well visualized than that on recent MRI. No new focal lesion is seen. Pancreas: Unremarkable. No pancreatic  ductal dilatation or surrounding inflammatory changes. Spleen: Normal in size without focal abnormality. Adrenals/Urinary Tract: Adrenal glands are within normal limits. Kidneys demonstrate a normal enhancement pattern bilaterally. No renal calculi or obstructive changes are seen. Ureters are within normal limits. The bladder is decompressed. Stomach/Bowel: Diverticular change of the colon is noted without definitive diverticulitis. No obstructive or inflammatory changes are seen. The appendix is well visualized and within normal limits. Small bowel and stomach are unremarkable. Vascular/Lymphatic: Aortic atherosclerosis. No enlarged abdominal or pelvic lymph nodes. Reproductive: Prostate is unremarkable. Other: Slight increase in ascites is noted when compared with the prior MRI. No free air is noted. Musculoskeletal: Degenerative changes of lumbar spine are noted. IMPRESSION: Known hepatic lesions appear stable from recent MRI. The left lobe lesion is less well visualized than on recent MRI. Slight increase in the degree of ascites when compare with the prior study. Diverticular change without definitive diverticulitis. Electronically Signed   By: MInez CatalinaM.D.   On: 07/12/2021 23:48    EKG: I have personally reviewed EKG: no EKG to review  Assessment/Plan Principal Problem:   Generalized weakness Active Problems:   Chronic diarrhea   Hepatocellular carcinoma (HCC)   DM (diabetes mellitus) type II controlled, [place]  Essential hypertension   COPD (chronic obstructive pulmonary disease) (HCC)   CKD (chronic kidney disease), stage III (HCC)    Generalized weakness Assign to observation. No significant electrolyte abnormalities despite reported 4 weeks of diarrhea(5-7 times per day). Has had negative GI panel at the end of September 2022. Pt states stopping his metformin had no effect on his diarrhea.  Suprisingly, pt states that since 4 pm on 07-12-2021 while in the  waiting room he has not had anymore diarrhea. Pt's labs do not show any lab evidence of dehydration. Pt does not have any tense ascites that needs to be drained. Pt is lying flat on ER gurney and not having any difficulty breathing.  Chronic diarrhea ER has sent another GI panel. GI panel as outpatient 07-08-2021 was negative. Pt may benefit from seeing GI consult in the hospital to help him better understand his chronic diarrhea management. Reportedly immodium has not helped. C.diff panel pending.  Hepatocellular carcinoma (HCC) Chronic.  DM (diabetes mellitus) type II controlled, neurological manifestation (HCC) Stable.  Essential hypertension Stable.  COPD (chronic obstructive pulmonary disease) (HCC) Stable. Not exacerbated.  CKD (chronic kidney disease), stage III (HCC) Stable.  DVT prophylaxis: SCDs Code Status: Full Code Family Communication: no family at bedside  Disposition Plan: return home  Consults called: EDP has asked GI to see patient in the AM.  Admission status: Observation, Med-Surg   Kristopher Oppenheim, DO [facility] 07/13/2021, 1:00 AM

## 2021-07-13 NOTE — Assessment & Plan Note (Signed)
Chronic. 

## 2021-07-13 NOTE — Assessment & Plan Note (Signed)
ER has sent another GI panel. GI panel as outpatient 07-08-2021 was negative. Pt may benefit from seeing GI consult in the hospital to help him better understand his chronic diarrhea management. Reportedly immodium has not helped. C.diff panel pending.

## 2021-07-13 NOTE — ED Provider Notes (Signed)
  Physical Exam  BP (!) 141/75 (BP Location: Right Arm)   Pulse (!) 101   Temp 98.6 F (37 C) (Oral)   Resp (!) 24   Ht 5\' 11"  (1.803 m)   Wt 89.4 kg   SpO2 95%   BMI 27.48 kg/m   Physical Exam  ED Course/Procedures   Clinical Course as of 07/13/21 0019  Mon Jul 12, 2021  2308 Signed out to Dr Darl Householder pending likely obs admission for GI consult in the AM and continued IV fluids, f/u on CT imaging. [MT]    Clinical Course User Index [MT] Trifan, Carola Rhine, MD    Procedures  MDM  Care assumed at 11 PM.  Patient is here with persistent diarrhea and weakness.  Patient has a history of hepatocellular carcinoma with cirrhosis.  Labs showed slightly low magnesium level and was supplemented.  Patient had a GI pathogen panel that was negative recently.  Signout pending CT abdomen pelvis and admission for dehydration  12:20 AM CT showed hepatic lesions that are stable and no increase ascites.  Patient had at least 3 watery bowel movements since he has been in the ED.  GI pathogen and C. difficile sent.  I sent a secure chat to Dr. Hilarie Fredrickson, his GI doctor, to see patient in AM. Hospitalist to admit for dehydration        Drenda Freeze, MD 07/13/21 0020

## 2021-07-13 NOTE — ED Notes (Signed)
Educated pt about importance of eating meal. Pt refused, this RN will try again shortly.

## 2021-07-13 NOTE — Evaluation (Signed)
Physical Therapy Evaluation Patient Details Name: [name] MRN: [id number] DOB: [birth year] Today's Date: 07/13/2021  History of Present Illness  Pt is an 80 y/o male admitted 10/3 secondary to diarrhea and weakness. PMH includes hepatocellular carcinoma, DM, CKD, COPD, and HTN.  Clinical Impression  Pt admitted secondary to problem above with deficits below. Pt requiring min guard for mobility tasks in room using cane. Gait limited as pt needing to urgently go to bathroom. Educated about using RW for increased safety at home. Feel he would benefit from HHPT at d/c to address deficits. Will continue to follow acutely.        Recommendations for follow up therapy are one component of a multi-disciplinary discharge planning process, led by the attending physician.  Recommendations may be updated based on patient status, additional functional criteria and insurance authorization.  Follow Up Recommendations Home health PT;Supervision for mobility/OOB    Equipment Recommendations  None recommended by PT    Recommendations for Other Services       Precautions / Restrictions Precautions Precautions: Fall Restrictions Weight Bearing Restrictions: No      Mobility  Bed Mobility Overal bed mobility: Needs Assistance Bed Mobility: Supine to Sit;Sit to Supine     Supine to sit: Supervision Sit to supine: Supervision   General bed mobility comments: Supervision for safety.    Transfers Overall transfer level: Needs assistance Equipment used: Straight cane Transfers: Sit to/from Stand Sit to Stand: Min guard         General transfer comment: Min guard for safety.  Ambulation/Gait Ambulation/Gait assistance: Min guard Gait Distance (Feet): 20 Feet Assistive device: Straight cane Gait Pattern/deviations: Step-through pattern;Decreased stride length Gait velocity: Decreased   General Gait Details: Min guard for safety for gait in the room. Pt having to use bathroom  urgently so further mobility deferred. Min guard for safety. Educated about using RW for increased safety.  Stairs            Wheelchair Mobility    Modified Rankin (Stroke Patients Only)       Balance Overall balance assessment: Needs assistance Sitting-balance support: No upper extremity supported;Feet supported Sitting balance-Leahy Scale: Good     Standing balance support: Single extremity supported;During functional activity Standing balance-Leahy Scale: Poor Standing balance comment: Reliant on at least one UE support                             Pertinent Vitals/Pain Pain Assessment: No/denies pain    Home Living Family/patient expects to be discharged to:: Private residence Living Arrangements: Spouse/significant other Available Help at Discharge: Family;Available 24 hours/day Type of Home: House Home Access: Ramped entrance     Home Layout: One level Home Equipment: Shower seat;Cane - single point;Walker - 4 wheels;Walker - 2 wheels Additional Comments: Wife has dementia    Prior Function Level of Independence: Independent with assistive device(s)         Comments: Uses cane for ambulation     Hand Dominance        Extremity/Trunk Assessment   Upper Extremity Assessment Upper Extremity Assessment: Overall WFL for tasks assessed    Lower Extremity Assessment Lower Extremity Assessment: Generalized weakness    Cervical / Trunk Assessment Cervical / Trunk Assessment: Kyphotic  Communication   Communication: No difficulties  Cognition Arousal/Alertness: Awake/alert Behavior During Therapy: WFL for tasks assessed/performed Overall Cognitive Status: Within Functional Limits for tasks assessed  General Comments      Exercises     Assessment/Plan    PT Assessment Patient needs continued PT services  PT Problem List Decreased strength;Decreased activity  tolerance;Decreased balance;Decreased mobility;Decreased knowledge of precautions;Decreased knowledge of use of DME       PT Treatment Interventions DME instruction;Gait training;Stair training;Functional mobility training;Therapeutic activities;Therapeutic exercise;Balance training;Patient/family education    PT Goals (Current goals can be found in the Care Plan section)  Acute Rehab PT Goals Patient Stated Goal: to feel better and go home PT Goal Formulation: With patient Time For Goal Achievement: 07/27/21 Potential to Achieve Goals: Good    Frequency Min 3X/week   Barriers to discharge        Co-evaluation               AM-PAC PT "6 Clicks" Mobility  Outcome Measure Help needed turning from your back to your side while in a flat bed without using bedrails?: None Help needed moving from lying on your back to sitting on the side of a flat bed without using bedrails?: None Help needed moving to and from a bed to a chair (including a wheelchair)?: A Little Help needed standing up from a chair using your arms (e.g., wheelchair or bedside chair)?: A Little Help needed to walk in hospital room?: A Little Help needed climbing 3-5 steps with a railing? : A Lot 6 Click Score: 19    End of Session Equipment Utilized During Treatment: Gait belt Activity Tolerance: Patient tolerated treatment well Patient left: in bed;with call bell/phone within reach (on stretcher in ED) Nurse Communication: Mobility status PT Visit Diagnosis: Unsteadiness on feet (R26.81);Muscle weakness (generalized) (M62.81)    Time: 1035-1100 PT Time Calculation (min) (ACUTE ONLY): 25 min   Charges:   PT Evaluation $PT Eval Low Complexity: 1 Low PT Treatments $Therapeutic Activity: 8-22 mins        Lou Miner, DPT  Acute Rehabilitation Services  Pager: [phone] Office: [phone]   Rudean Hitt 07/13/2021, 12:12 PM

## 2021-07-13 NOTE — Subjective & Objective (Signed)
CC: weakness. HPI: 80 year old white male with a history of hepatocellular carcinoma, diabetes type 2, CKD stage III, COPD, interstitial lung disease, hypertension who presents to the ER today with a 4-week history of chronic diarrhea.  Patient has been seen by GI.  Patient's has had an outpatient GI diarrhea panel performed which was negative for infectious cause of diarrhea.  Patient states his metformin was also discontinued temporarily which had no effect on his diarrhea.  Patient states that he is having 5-7 episodes of diarrhea per day.  Patient states that he is wearing adult undergarments.  He states that he is having fecal incontinence.  Patient feels dehydrated.  Patient was urged to come to the ER by his family earlier this week but declined.  Patient presented to the ER today after urging By his family.  On arrival to the ER temp was 98.1.  Heart rate is 103.  Blood pressure 141/90.  Room air saturations 97%.  Laboratory evaluation White count 5.3 hemoglobin 1.0 platelets of 97.  Magnesium 1.3.  Sodium 133, serum bicarbonate 21, BUN of 10 creatinine 0.87, total protein 6.0, albumin 3.1.  COVID-negative.  Urinalysis specific gravity 1.017.  Small leukocyte esterase rare bacteria.  Negative protein negative nitrites.  CT abdomen pelvis showed ascites.  Patient also had his known hepatic lesions that appeared stable from his prior MRI.  Patient given 2 g of magnesium along with 1000 cc normal saline.  Patient still felt weak.  ER provider asked [facility] hospitalist to admit the patient overnight for observation.

## 2021-07-13 NOTE — Assessment & Plan Note (Signed)
Stable. Not exacerbated. 

## 2021-07-13 NOTE — ED Notes (Signed)
Report given to Martyn Ehrich, RN

## 2021-07-13 NOTE — H&P (View-Only) (Signed)
[facility] Consult: 9:27 AM 07/13/2021  LOS: 0 days    Referring Provider: Dr Raelyn Mora  Primary Care Physician:  [name], MD Primary Gastroenterologist:  Dr. Hilarie Fredrickson.   Oncologist: Dr Irene Limbo    Reason for Consultation:  Diarrhea.     HPI: [name] is a 80 y.o. male.  PMH Thrombocytopenia. DM 2.  Anemia.  B12 deficiency.  Received 1 PRBC early July, Venofer infusion in late July 2022.  Peripheral neuropathy. Cirrhosis, hepatocellular carcinoma.  Diagnosed 02/2020.  Treated with SBRT.  Intra-arterial therapy not successful due to occlusive disease of iliofemoral arteries.  New lesion diagnosed 04/2021.  On 05/06/2021 underwent CT-guided riducial marker placement and initiated on radiation treatment.  Patient completed 6 cycles of SBRT in late August.  GI never involved in dx/mgt of cirrhosis, HCCA  02/2019 colonoscopy.  Adenoma surveillance previous colonoscopies 1997, 2008.  A total of 18 polyps were removed.  Size anywhere from 3 to 15 mm.  Severe diverticulosis involving a sending, descending and sigmoid colon.  Nonbleeding external/internal hemorrhoids.  Path confirmed multiple tubular adenomas without HGD. 06/29/2021 MRI abdomen: Mild decreased size of treated lesion in segment 4A, though persistent rim hyper and enhancement compatible with residual, viable tumor.  No change in LR 5 lesion in left lobe, C/W hepatocellular carcinoma.  Cirrhosis.  Splenomegaly.  Interval increase in abdominal/pelvic ascites.  For 3 to 4 weeks having painless, watery, non-bloody diarrhea.  Episodes occur anywhere from 3-6 times a day.  07/08/2021 stool pathogen PCR panel entirely negative including C. difficile toxin A/B.  Episodes significantly but not exclusively related to p.o. intake which is generally poor.  Long-term  anorexia unchanged.  Diarrhea is resolved for several hours if he uses Imodium but he is not using this unless he needs to go somewhere such as of doctor's appointment.  No nausea or vomiting.  No dysphagia.  Abdominal girth stable.  No lower extremity or scrotal swelling.  Denies excessive bleeding but bruises easily, no large hematomas.  Has upcoming appointment with GI APP tomorrow and with GI MD later this month and an EGD, but not colonoscopy, scheduled for November.    Presented to ED with progressive weakness associated with the ongoing diarrhea.  Having significant weakness and dyspnea when trying to walk from room to room at his home. Na 133.  Renal function preserved.  LFTs normal.  Hgb 11 >> 9.7 after IV fluids.  Was 9.9 last week.  MCV 103.  Iron studies, ferritin normal last week. CTAP w contrast: Stable hepatic lesions compared with previous MRI, left lobe lesion less well visualized than on MRI.  Slight increased ascites.  Diverticulosis.  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  Lives with his wife. No family history of colorectal cancer, ulcer disease, anemia per his recall.    Past Medical History:  Diagnosis Date   Benign prostatic hypertrophy    Blood transfusion without reported diagnosis    CHF (congestive heart failure) ([place])    a. EF 20-25% by echo in 11/2019  COPD (chronic obstructive pulmonary disease) ([place]) 12/15/2009   FEV1 2.30 (70%) ratio 63 no better with B2 and DLCO 18/6 (73%) corrects to 106%   Degenerative joint disease    Depression    Diverticulosis    Essential hypertension    ACE inhibitor cough   Fatty liver    Hyperlipidemia    Internal hemorrhoids    Liver cancer ([place])    Low back pain    Otitis externa 07/30/2012   Splenomegaly    Type 2 diabetes mellitus ([place])    Ulcer of foot ([place])    Right foot    Past Surgical History:  Procedure Laterality Date   COLONOSCOPY  2020   JMP-MAC-plenvu (good)-polyps-recall 1 yr   IR  EMBO TUMOR ORGAN ISCHEMIA INFARCT INC GUIDE ROADMAPPING  06/01/2020   IR RADIOLOGIST EVAL & MGMT  04/28/2020   IR RADIOLOGIST EVAL & MGMT  07/15/2020   IR RADIOLOGIST EVAL & MGMT  05/04/2021   NO PAST SURGERIES  1980    Prior to Admission medications   Medication Sig Start Date End Date Taking? Authorizing Provider  acetaminophen (TYLENOL) 500 MG tablet Take 1,000-1,500 mg by mouth 3 (three) times daily. 1500 mg in the morning and at bedtime 1000 mg midday   Yes [provider]  albuterol (VENTOLIN HFA) 108 (90 Base) MCG/ACT inhaler Inhale 2 puffs into the lungs every 6 (six) hours as needed for wheezing or shortness of breath. 11/29/19  Yes [name], MD  aspirin EC 81 MG tablet Take 81 mg by mouth daily.   Yes [provider]  Blood Glucose Monitoring Suppl (TRUE METRIX AIR GLUCOSE METER) w/Device KIT  08/01/18  Yes [provider]  busPIRone (BUSPAR) 7.5 MG tablet Take 1 tablet (7.5 mg total) by mouth 2 (two) times daily as needed. for anxiety Patient taking differently: Take 7.5 mg by mouth at bedtime. 05/14/21  Yes [name], MD  carvedilol (COREG) 6.25 MG tablet TAKE 1 TABLET TWICE DAILY (DOSE INCREASE) Patient taking differently: Take 6.25 mg by mouth 2 (two) times daily with a meal. 11/25/20  Yes Satira Sark, MD  ENTRESTO 49-51 MG TAKE 1 TABLET TWICE DAILY Patient taking differently: Take 1 tablet by mouth 2 (two) times daily. 04/22/21  Yes Satira Sark, MD  escitalopram (LEXAPRO) 5 MG tablet Take 1 tablet (5 mg total) by mouth daily. 05/14/21  Yes [name], MD  famotidine (PEPCID) 20 MG tablet TAKE 1 TABLET TWICE DAILY Patient taking differently: Take 20 mg by mouth 2 (two) times daily. 05/26/21  Yes [name], MD  finasteride (PROSCAR) 5 MG tablet TAKE 1 TABLET EVERY DAY Patient taking differently: Take 5 mg by mouth daily. 05/26/21  Yes [name], MD  furosemide (LASIX) 20 MG tablet TAKE 1 TABLET (20 MG TOTAL)  BY MOUTH DAILY. 05/26/21 07/13/21 Yes [name], MD  gabapentin (NEURONTIN) 300 MG capsule TAKE 1 CAPSULE TWICE DAILY Patient taking differently: Take 300 mg by mouth 2 (two) times daily. 03/15/21  Yes [name], MD  glucose blood (TRUE METRIX BLOOD GLUCOSE TEST) test strip TEST BLOOD SUGAR EVERY DAY 11/05/19  Yes [name], MD  metFORMIN (GLUCOPHAGE) 500 MG tablet Take 1 tablet (500 mg total) by mouth 3 (three) times daily with meals. 12/15/20  Yes [name], MD  Polyvinyl Alcohol-Povidone (REFRESH OP) Place 1 drop into both eyes at bedtime.   Yes [provider]  rosuvastatin (CRESTOR) 10 MG  tablet TAKE 1 TABLET EVERY DAY Patient taking differently: Take 10 mg by mouth at bedtime. 05/14/21  Yes [name], MD  tamsulosin (FLOMAX) 0.4 MG CAPS capsule TAKE 1 CAPSULE EVERY DAY Patient taking differently: Take 0.4 mg by mouth daily. 05/26/21  Yes [name], MD  TRUEPLUS LANCETS 28G MISC Use to test blood sugars two times daily. Dx: E11.9 07/30/18  Yes [name], MD  acyclovir (ZOVIRAX) 400 MG tablet TAKE 1 TABLET TWICE DAILY Patient not taking: Reported on 07/13/2021 08/17/20   [name], MD  clotrimazole (LOTRIMIN) 1 % cream For athlete's feet, apply to both feet and between toes twice daily for 4 weeks. Patient not taking: No sig reported 05/15/20   Marzetta Board, DPM  clotrimazole-betamethasone (LOTRISONE) cream Apply 1 application topically 2 (two) times daily. For 7 days maximum for severe jock itch . Stop if worsening symptoms. Patient not taking: Reported on 07/13/2021 11/07/19   [name], MD  mupirocin ointment Drue Stager) 2 % Apply to right foot ulcer once daily and cover with dressing. Patient not taking: No sig reported 02/18/20   Marzetta Board, DPM  SUPREP BOWEL PREP KIT 17.5-3.13-1.6 GM/177ML SOLN  05/13/21   [provider]    Scheduled Meds:  acyclovir  400 mg Oral BID   aspirin EC  81 mg Oral  Daily   carvedilol  6.25 mg Oral BID WC   escitalopram  5 mg Oral Daily   finasteride  5 mg Oral Daily   gabapentin  300 mg Oral BID   insulin aspart  0-5 Units Subcutaneous QHS   insulin aspart  0-9 Units Subcutaneous TID WC   sacubitril-valsartan  1 tablet Oral BID   tamsulosin  0.4 mg Oral Daily   Infusions:  PRN Meds: acetaminophen **OR** acetaminophen, ondansetron **OR** ondansetron (ZOFRAN) IV   Allergies as of 07/12/2021   (No Known Allergies)    Family History  Problem Relation Age of Onset   Melanoma Mother    Stroke Father    CAD Brother        Premature disease   Colon polyps Neg Hx    Colon cancer Neg Hx    Esophageal cancer Neg Hx    Stomach cancer Neg Hx    Rectal cancer Neg Hx     Social History   Socioeconomic History   Marital status: Unknown    Spouse name: Not on file   Number of children: Not on file   Years of education: Not on file   Highest education level: Not on file  Occupational History   Occupation: retired    Comment: maintenence work  Tobacco Use   Smoking status: Former    Packs/day: 4.00    Years: 20.00    Pack years: 80.00    Types: Cigarettes    Quit date: 09/18/1984    Years since quitting: 36.8   Smokeless tobacco: Never   Tobacco comments:    quit in 1980  Vaping Use   Vaping Use: Never used  Substance and Sexual Activity   Alcohol use: No    Comment: no drinking since 40 years plus   Drug use: No   Sexual activity: Yes  Other Topics Concern   Not on file  Social History Narrative   ** Merged History Encounter **       Married 44 years in 2015. 4 kids (2 sons) and 3 grandkids.    Lived in [place], [place] wholel life  Retired from [employer], going to [employer] where they have a Product/process development scientist of Radio broadcast assistant Strain: Low Risk    Difficulty of Paying Living Expenses: Not hard at all  Food Insecurity: No Food Insecurity   Worried About Paediatric nurse in the Last Year: Never true   Arboriculturist in the Last Year: Never true  Transportation Needs: No Transportation Needs   Lack of Transportation (Medical): No   Lack of Transportation (Non-Medical): No  Physical Activity: Inactive   Days of Exercise per Week: 0 days   Minutes of Exercise per Session: 0 min  Stress: No Stress Concern Present   Feeling of Stress : Not at all  Social Connections: Moderately Integrated   Frequency of Communication with Friends and Family: More than three times a week   Frequency of Social Gatherings with Friends and Family: Twice a week   Attends Religious Services: More than 4 times per year   Active Member of [employer] or Organizations: No   Attends Archivist Meetings: Never   Marital Status: Married  Human resources officer Violence: Not At Risk   Fear of Current or Ex-Partner: No   Emotionally Abused: No   Physically Abused: No   Sexually Abused: No    REVIEW OF SYSTEMS: Constitutional: Weakness.  Fatigue. ENT:  No nose bleeds Pulm: Dyspnea with exertion.  No significant cough. CV:  No palpitations, no LE edema.  No angina. GU:  No hematuria, no frequency GI: Per HPI. Heme: Per HPI. Transfusions: Per HPI. Neuro:  No headaches, no seizures, no syncope. Derm: Nonpruritic rash at lower abdomen he attributes to the pants waistband and belt.  This area bled a little bit yesterday. Endocrine:  No sweats or chills.  No polyuria or dysuria Immunization: Reviewed.  He is up-to-date on multiple vaccines including COVID-19. Travel:  None beyond local counties in last few months.    PHYSICAL EXAM: Vital signs in last 24 hours: Vitals:   07/13/21 0700 07/13/21 0815  BP: 122/66 120/61  Pulse: 92 72  Resp: 19 16  Temp:    SpO2: 97% 94%   Wt Readings from Last 3 Encounters:  07/12/21 89.4 kg  07/06/21 89.5 kg  07/05/21 89.8 kg    General: Chronically and somewhat acutely ill-appearing but comfortable elderly patient.  In no  distress and resting comfortably on bed. Head: No signs of head trauma.  No facial asymmetry or swelling. Eyes: No scleral icterus.  No conjunctival pallor. Ears: Slightly diminished hearing. Nose: Congestion or discharge Mouth: Full dentures in place.  Mucosa is moist, pink, clear.  Tongue midline. Neck: No JVD, no masses, no thyromegaly Lungs: Clear bilaterally.  Breath sounds diminished/absent in left upper quadrant.  No cough.  No dyspnea with speech or at rest Heart: RRR.  Soft systolic murmur.  S1, S2 present Abdomen: Large, soft, not protuberant.  Ventral hernia versus muscular diastases.  No HSM, masses, bruits..   Rectal: Deferred. GU: No scrotal edema. Musc/Skeltl: No joint redness, swelling or gross deformity. Extremities: No CCE. Neurologic: Appropriate.  Oriented x3.  No asterixis or tremors.  Moves all 4 limbs, strength not tested. Skin: Erythema in horizontal area of lower abdomen, include some scabbed areas. Nodes: No cervical adenopathy Psych: Calm, pleasant, fluid speech.  Cooperative.  Intake/Output from previous day: No intake/output data recorded. Intake/Output this shift: No intake/output  data recorded.  LAB RESULTS: Recent Labs    07/12/21 1311 07/13/21 0318  WBC 5.3 3.9*  HGB 11.0* 9.7*  HCT 32.8* 28.4*  PLT 97* 81*   BMET Lab Results  Component Value Date   NA 133 (L) 07/13/2021   NA 133 (L) 07/12/2021   NA 137 07/06/2021   K 4.2 07/13/2021   K 4.4 07/12/2021   K 4.9 07/06/2021   CL 104 07/13/2021   CL 104 07/12/2021   CL 106 07/06/2021   CO2 22 07/13/2021   CO2 21 (L) 07/12/2021   CO2 22 07/06/2021   GLUCOSE 94 07/13/2021   GLUCOSE 121 (H) 07/12/2021   GLUCOSE 120 (H) 07/06/2021   BUN 11 07/13/2021   BUN 10 07/12/2021   BUN 14 07/06/2021   CREATININE 0.86 07/13/2021   CREATININE 0.87 07/12/2021   CREATININE 0.98 07/06/2021   CALCIUM 8.4 (L) 07/13/2021   CALCIUM 8.8 (L) 07/12/2021   CALCIUM 8.9 07/06/2021   LFT Recent Labs     07/12/21 1311 07/13/21 0318  PROT 6.0* 5.3*  ALBUMIN 3.1* 2.7*  AST 30 27  ALT 16 13  ALKPHOS 82 82  BILITOT 1.0 1.1   PT/INR Lab Results  Component Value Date   INR 1.0 05/06/2021   INR 1.0 08/06/2020   INR 1.1 06/01/2020   Hepatitis Panel No results for input(s): HEPBSAG, HCVAB, HEPAIGM, HEPBIGM in the last 72 hours. C-Diff No components found for: CDIFF Lipase     Component Value Date/Time   LIPASE 48 07/12/2021 1311    Drugs of Abuse     Component Value Date/Time   LABOPIA POS (A) 05/06/2015 1638   COCAINSCRNUR NEG 05/06/2015 1638   LABBENZ NEG 05/06/2015 1638   AMPHETMU NEG 05/06/2015 1638     RADIOLOGY STUDIES: CT ABDOMEN PELVIS W CONTRAST  Result Date: 07/12/2021 CLINICAL DATA:  Worsening diarrhea with abdominal pain, initial encounter EXAM: CT ABDOMEN AND PELVIS WITH CONTRAST TECHNIQUE: Multidetector CT imaging of the abdomen and pelvis was performed using the standard protocol following bolus administration of intravenous contrast. CONTRAST:  115m OMNIPAQUE IOHEXOL 350 MG/ML SOLN COMPARISON:  07/01/2021 MRI FINDINGS: Lower chest: Lung bases are free of acute infiltrate or sizable effusion. Herniation of some omental fat is noted through the esophageal hiatus. No definitive herniation of the stomach is seen. Hepatobiliary: Liver is well visualized. Postsurgical changes are noted. Fiducial markings are again noted. Prior treated lesion is again seen and stable. The left lobe lesion marked by the fiducial markers is less well visualized than that on recent MRI. No new focal lesion is seen. Pancreas: Unremarkable. No pancreatic ductal dilatation or surrounding inflammatory changes. Spleen: Normal in size without focal abnormality. Adrenals/Urinary Tract: Adrenal glands are within normal limits. Kidneys demonstrate a normal enhancement pattern bilaterally. No renal calculi or obstructive changes are seen. Ureters are within normal limits. The bladder is decompressed.  Stomach/Bowel: Diverticular change of the colon is noted without definitive diverticulitis. No obstructive or inflammatory changes are seen. The appendix is well visualized and within normal limits. Small bowel and stomach are unremarkable. Vascular/Lymphatic: Aortic atherosclerosis. No enlarged abdominal or pelvic lymph nodes. Reproductive: Prostate is unremarkable. Other: Slight increase in ascites is noted when compared with the prior MRI. No free air is noted. Musculoskeletal: Degenerative changes of lumbar spine are noted. IMPRESSION: Known hepatic lesions appear stable from recent MRI. The left lobe lesion is less well visualized than on recent MRI. Slight increase in the degree of ascites when compare with the  prior study. Diverticular change without definitive diverticulitis. Electronically Signed   By: Inez Catalina M.D.   On: 07/12/2021 23:48      IMPRESSION:    Watery,  non-bloody Diarrhea for close to 1 month.  Stool PCR study negative.  No associated abdominal pain.  Chronic anorexia, no nausea/vomiting.  CTAP demonstrates only colonic diverticulosis but no colitis, diverticulitis or enteritis.  Its been 6 weeks or more since last radiation treatment to address recurrent hepatocellular carcinoma.     Cirrhosis of the liver attributed to remote alcohol abuse though other etiologies not worked up.  HCV, hepatitis B surface Ag, hepatitis B core total  Ab negative.  Hepatocellular carcinoma treated with SBRT in 2021 and recurrence treated again with SBRT in July 2022.    PLAN:       Colonoscopy and EGD for tomorrow.  Time TBD.  Pt willing to undergo and would like to know what is causing the diarrhea and what can be done to resolve this.  Orders for split dose movie prep and clear liquid diet ordered.   Azucena Freed  07/13/2021, 9:27 AM Phone [phone]  I have taken a history, reviewed the chart and examined the patient. I performed a substantive portion of this encounter, including  complete performance of at least one of the key components, in conjunction with the APP. I agree with the APP's note, impression and recommendations   80 year old male with history of CHF, cirrhosis with [place] s/p SBRT, DM and a history of chronic intermittent diarrhea for many years, but with 4 weeks of constant diarrhea resulting in profound fatigue for the patient, prompting admission for hydration and expedited evaluation.  Reassuringly, his labs including electrolytes, renal function and CBC were not significantly abnormal.  Stool studies negative for infectious cause and CT showed no bowel wall thickening.  No history of flushing or bronchospasm.  History of chronic watery diarrhea with urgency/incontinence without abdominal pain/n/v suggestive of microscopic colitis.  Will plan for colonoscopy tomorrow to evaluate for [facility].  Will also perform EGD for variceal screening and to assess chronic anemia.  Diarrhea, chronic, severe, unlikely to be inflammatory or infectious - Colonoscopy tomorrow - Clear liquids today, NPO except bowel prep after midnight - Further recommendations for management of diarrhea following colonoscopy.  Cirrhosis/HCC - EGD for variceal screening  Anemia, chronic macrocytic - EGD with gastric biopsies as above  Gowri Suchan E. Candis Schatz, MD [facility]

## 2021-07-13 NOTE — Consult Note (Addendum)
[facility] Consult: 9:27 AM 07/13/2021  LOS: 0 days    Referring Provider: Dr Raelyn Mora  Primary Care Physician:  [name], MD Primary Gastroenterologist:  Dr. Hilarie Fredrickson.   Oncologist: Dr Irene Limbo    Reason for Consultation:  Diarrhea.     HPI: [name] is a 80 y.o. male.  PMH Thrombocytopenia. DM 2.  Anemia.  B12 deficiency.  Received 1 PRBC early July, Venofer infusion in late July 2022.  Peripheral neuropathy. Cirrhosis, hepatocellular carcinoma.  Diagnosed 02/2020.  Treated with SBRT.  Intra-arterial therapy not successful due to occlusive disease of iliofemoral arteries.  New lesion diagnosed 04/2021.  On 05/06/2021 underwent CT-guided riducial marker placement and initiated on radiation treatment.  Patient completed 6 cycles of SBRT in late August.  GI never involved in dx/mgt of cirrhosis, HCCA  02/2019 colonoscopy.  Adenoma surveillance previous colonoscopies 1997, 2008.  A total of 18 polyps were removed.  Size anywhere from 3 to 15 mm.  Severe diverticulosis involving a sending, descending and sigmoid colon.  Nonbleeding external/internal hemorrhoids.  Path confirmed multiple tubular adenomas without HGD. 06/29/2021 MRI abdomen: Mild decreased size of treated lesion in segment 4A, though persistent rim hyper and enhancement compatible with residual, viable tumor.  No change in LR 5 lesion in left lobe, C/W hepatocellular carcinoma.  Cirrhosis.  Splenomegaly.  Interval increase in abdominal/pelvic ascites.  For 3 to 4 weeks having painless, watery, non-bloody diarrhea.  Episodes occur anywhere from 3-6 times a day.  07/08/2021 stool pathogen PCR panel entirely negative including C. difficile toxin A/B.  Episodes significantly but not exclusively related to p.o. intake which is generally poor.  Long-term  anorexia unchanged.  Diarrhea is resolved for several hours if he uses Imodium but he is not using this unless he needs to go somewhere such as of doctor's appointment.  No nausea or vomiting.  No dysphagia.  Abdominal girth stable.  No lower extremity or scrotal swelling.  Denies excessive bleeding but bruises easily, no large hematomas.  Has upcoming appointment with GI APP tomorrow and with GI MD later this month and an EGD, but not colonoscopy, scheduled for November.    Presented to ED with progressive weakness associated with the ongoing diarrhea.  Having significant weakness and dyspnea when trying to walk from room to room at his home. Na 133.  Renal function preserved.  LFTs normal.  Hgb 11 >> 9.7 after IV fluids.  Was 9.9 last week.  MCV 103.  Iron studies, ferritin normal last week. CTAP w contrast: Stable hepatic lesions compared with previous MRI, left lobe lesion less well visualized than on MRI.  Slight increased ascites.  Diverticulosis.  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  Lives with his wife. No family history of colorectal cancer, ulcer disease, anemia per his recall.    Past Medical History:  Diagnosis Date   Benign prostatic hypertrophy    Blood transfusion without reported diagnosis    CHF (congestive heart failure) ([place])    a. EF 20-25% by echo in 11/2019  COPD (chronic obstructive pulmonary disease) ([place]) 12/15/2009   FEV1 2.30 (70%) ratio 63 no better with B2 and DLCO 18/6 (73%) corrects to 106%   Degenerative joint disease    Depression    Diverticulosis    Essential hypertension    ACE inhibitor cough   Fatty liver    Hyperlipidemia    Internal hemorrhoids    Liver cancer ([place])    Low back pain    Otitis externa 07/30/2012   Splenomegaly    Type 2 diabetes mellitus ([place])    Ulcer of foot ([place])    Right foot    Past Surgical History:  Procedure Laterality Date   COLONOSCOPY  2020   JMP-MAC-plenvu (good)-polyps-recall 1 yr   IR  EMBO TUMOR ORGAN ISCHEMIA INFARCT INC GUIDE ROADMAPPING  06/01/2020   IR RADIOLOGIST EVAL & MGMT  04/28/2020   IR RADIOLOGIST EVAL & MGMT  07/15/2020   IR RADIOLOGIST EVAL & MGMT  05/04/2021   NO PAST SURGERIES  1980    Prior to Admission medications   Medication Sig Start Date End Date Taking? Authorizing Provider  acetaminophen (TYLENOL) 500 MG tablet Take 1,000-1,500 mg by mouth 3 (three) times daily. 1500 mg in the morning and at bedtime 1000 mg midday   Yes [provider]  albuterol (VENTOLIN HFA) 108 (90 Base) MCG/ACT inhaler Inhale 2 puffs into the lungs every 6 (six) hours as needed for wheezing or shortness of breath. 11/29/19  Yes [name], MD  aspirin EC 81 MG tablet Take 81 mg by mouth daily.   Yes [provider]  Blood Glucose Monitoring Suppl (TRUE METRIX AIR GLUCOSE METER) w/Device KIT  08/01/18  Yes [provider]  busPIRone (BUSPAR) 7.5 MG tablet Take 1 tablet (7.5 mg total) by mouth 2 (two) times daily as needed. for anxiety Patient taking differently: Take 7.5 mg by mouth at bedtime. 05/14/21  Yes [name], MD  carvedilol (COREG) 6.25 MG tablet TAKE 1 TABLET TWICE DAILY (DOSE INCREASE) Patient taking differently: Take 6.25 mg by mouth 2 (two) times daily with a meal. 11/25/20  Yes Satira Sark, MD  ENTRESTO 49-51 MG TAKE 1 TABLET TWICE DAILY Patient taking differently: Take 1 tablet by mouth 2 (two) times daily. 04/22/21  Yes Satira Sark, MD  escitalopram (LEXAPRO) 5 MG tablet Take 1 tablet (5 mg total) by mouth daily. 05/14/21  Yes [name], MD  famotidine (PEPCID) 20 MG tablet TAKE 1 TABLET TWICE DAILY Patient taking differently: Take 20 mg by mouth 2 (two) times daily. 05/26/21  Yes [name], MD  finasteride (PROSCAR) 5 MG tablet TAKE 1 TABLET EVERY DAY Patient taking differently: Take 5 mg by mouth daily. 05/26/21  Yes [name], MD  furosemide (LASIX) 20 MG tablet TAKE 1 TABLET (20 MG TOTAL)  BY MOUTH DAILY. 05/26/21 07/13/21 Yes [name], MD  gabapentin (NEURONTIN) 300 MG capsule TAKE 1 CAPSULE TWICE DAILY Patient taking differently: Take 300 mg by mouth 2 (two) times daily. 03/15/21  Yes [name], MD  glucose blood (TRUE METRIX BLOOD GLUCOSE TEST) test strip TEST BLOOD SUGAR EVERY DAY 11/05/19  Yes [name], MD  metFORMIN (GLUCOPHAGE) 500 MG tablet Take 1 tablet (500 mg total) by mouth 3 (three) times daily with meals. 12/15/20  Yes [name], MD  Polyvinyl Alcohol-Povidone (REFRESH OP) Place 1 drop into both eyes at bedtime.   Yes [provider]  rosuvastatin (CRESTOR) 10 MG  tablet TAKE 1 TABLET EVERY DAY Patient taking differently: Take 10 mg by mouth at bedtime. 05/14/21  Yes [name], MD  tamsulosin (FLOMAX) 0.4 MG CAPS capsule TAKE 1 CAPSULE EVERY DAY Patient taking differently: Take 0.4 mg by mouth daily. 05/26/21  Yes [name], MD  TRUEPLUS LANCETS 28G MISC Use to test blood sugars two times daily. Dx: E11.9 07/30/18  Yes [name], MD  acyclovir (ZOVIRAX) 400 MG tablet TAKE 1 TABLET TWICE DAILY Patient not taking: Reported on 07/13/2021 08/17/20   [name], MD  clotrimazole (LOTRIMIN) 1 % cream For athlete's feet, apply to both feet and between toes twice daily for 4 weeks. Patient not taking: No sig reported 05/15/20   Marzetta Board, DPM  clotrimazole-betamethasone (LOTRISONE) cream Apply 1 application topically 2 (two) times daily. For 7 days maximum for severe jock itch . Stop if worsening symptoms. Patient not taking: Reported on 07/13/2021 11/07/19   [name], MD  mupirocin ointment Drue Stager) 2 % Apply to right foot ulcer once daily and cover with dressing. Patient not taking: No sig reported 02/18/20   Marzetta Board, DPM  SUPREP BOWEL PREP KIT 17.5-3.13-1.6 GM/177ML SOLN  05/13/21   [provider]    Scheduled Meds:  acyclovir  400 mg Oral BID   aspirin EC  81 mg Oral  Daily   carvedilol  6.25 mg Oral BID WC   escitalopram  5 mg Oral Daily   finasteride  5 mg Oral Daily   gabapentin  300 mg Oral BID   insulin aspart  0-5 Units Subcutaneous QHS   insulin aspart  0-9 Units Subcutaneous TID WC   sacubitril-valsartan  1 tablet Oral BID   tamsulosin  0.4 mg Oral Daily   Infusions:  PRN Meds: acetaminophen **OR** acetaminophen, ondansetron **OR** ondansetron (ZOFRAN) IV   Allergies as of 07/12/2021   (No Known Allergies)    Family History  Problem Relation Age of Onset   Melanoma Mother    Stroke Father    CAD Brother        Premature disease   Colon polyps Neg Hx    Colon cancer Neg Hx    Esophageal cancer Neg Hx    Stomach cancer Neg Hx    Rectal cancer Neg Hx     Social History   Socioeconomic History   Marital status: Unknown    Spouse name: Not on file   Number of children: Not on file   Years of education: Not on file   Highest education level: Not on file  Occupational History   Occupation: retired    Comment: maintenence work  Tobacco Use   Smoking status: Former    Packs/day: 4.00    Years: 20.00    Pack years: 80.00    Types: Cigarettes    Quit date: 09/18/1984    Years since quitting: 36.8   Smokeless tobacco: Never   Tobacco comments:    quit in 1980  Vaping Use   Vaping Use: Never used  Substance and Sexual Activity   Alcohol use: No    Comment: no drinking since 40 years plus   Drug use: No   Sexual activity: Yes  Other Topics Concern   Not on file  Social History Narrative   ** Merged History Encounter **       Married 44 years in 2015. 4 kids (2 sons) and 3 grandkids.    Lived in [place], [place] wholel life  Retired from [employer], going to [employer] where they have a Product/process development scientist of Radio broadcast assistant Strain: Low Risk    Difficulty of Paying Living Expenses: Not hard at all  Food Insecurity: No Food Insecurity   Worried About Paediatric nurse in the Last Year: Never true   Arboriculturist in the Last Year: Never true  Transportation Needs: No Transportation Needs   Lack of Transportation (Medical): No   Lack of Transportation (Non-Medical): No  Physical Activity: Inactive   Days of Exercise per Week: 0 days   Minutes of Exercise per Session: 0 min  Stress: No Stress Concern Present   Feeling of Stress : Not at all  Social Connections: Moderately Integrated   Frequency of Communication with Friends and Family: More than three times a week   Frequency of Social Gatherings with Friends and Family: Twice a week   Attends Religious Services: More than 4 times per year   Active Member of [employer] or Organizations: No   Attends Archivist Meetings: Never   Marital Status: Married  Human resources officer Violence: Not At Risk   Fear of Current or Ex-Partner: No   Emotionally Abused: No   Physically Abused: No   Sexually Abused: No    REVIEW OF SYSTEMS: Constitutional: Weakness.  Fatigue. ENT:  No nose bleeds Pulm: Dyspnea with exertion.  No significant cough. CV:  No palpitations, no LE edema.  No angina. GU:  No hematuria, no frequency GI: Per HPI. Heme: Per HPI. Transfusions: Per HPI. Neuro:  No headaches, no seizures, no syncope. Derm: Nonpruritic rash at lower abdomen he attributes to the pants waistband and belt.  This area bled a little bit yesterday. Endocrine:  No sweats or chills.  No polyuria or dysuria Immunization: Reviewed.  He is up-to-date on multiple vaccines including COVID-19. Travel:  None beyond local counties in last few months.    PHYSICAL EXAM: Vital signs in last 24 hours: Vitals:   07/13/21 0700 07/13/21 0815  BP: 122/66 120/61  Pulse: 92 72  Resp: 19 16  Temp:    SpO2: 97% 94%   Wt Readings from Last 3 Encounters:  07/12/21 89.4 kg  07/06/21 89.5 kg  07/05/21 89.8 kg    General: Chronically and somewhat acutely ill-appearing but comfortable elderly patient.  In no  distress and resting comfortably on bed. Head: No signs of head trauma.  No facial asymmetry or swelling. Eyes: No scleral icterus.  No conjunctival pallor. Ears: Slightly diminished hearing. Nose: Congestion or discharge Mouth: Full dentures in place.  Mucosa is moist, pink, clear.  Tongue midline. Neck: No JVD, no masses, no thyromegaly Lungs: Clear bilaterally.  Breath sounds diminished/absent in left upper quadrant.  No cough.  No dyspnea with speech or at rest Heart: RRR.  Soft systolic murmur.  S1, S2 present Abdomen: Large, soft, not protuberant.  Ventral hernia versus muscular diastases.  No HSM, masses, bruits..   Rectal: Deferred. GU: No scrotal edema. Musc/Skeltl: No joint redness, swelling or gross deformity. Extremities: No CCE. Neurologic: Appropriate.  Oriented x3.  No asterixis or tremors.  Moves all 4 limbs, strength not tested. Skin: Erythema in horizontal area of lower abdomen, include some scabbed areas. Nodes: No cervical adenopathy Psych: Calm, pleasant, fluid speech.  Cooperative.  Intake/Output from previous day: No intake/output data recorded. Intake/Output this shift: No intake/output  data recorded.  LAB RESULTS: Recent Labs    07/12/21 1311 07/13/21 0318  WBC 5.3 3.9*  HGB 11.0* 9.7*  HCT 32.8* 28.4*  PLT 97* 81*   BMET Lab Results  Component Value Date   NA 133 (L) 07/13/2021   NA 133 (L) 07/12/2021   NA 137 07/06/2021   K 4.2 07/13/2021   K 4.4 07/12/2021   K 4.9 07/06/2021   CL 104 07/13/2021   CL 104 07/12/2021   CL 106 07/06/2021   CO2 22 07/13/2021   CO2 21 (L) 07/12/2021   CO2 22 07/06/2021   GLUCOSE 94 07/13/2021   GLUCOSE 121 (H) 07/12/2021   GLUCOSE 120 (H) 07/06/2021   BUN 11 07/13/2021   BUN 10 07/12/2021   BUN 14 07/06/2021   CREATININE 0.86 07/13/2021   CREATININE 0.87 07/12/2021   CREATININE 0.98 07/06/2021   CALCIUM 8.4 (L) 07/13/2021   CALCIUM 8.8 (L) 07/12/2021   CALCIUM 8.9 07/06/2021   LFT Recent Labs     07/12/21 1311 07/13/21 0318  PROT 6.0* 5.3*  ALBUMIN 3.1* 2.7*  AST 30 27  ALT 16 13  ALKPHOS 82 82  BILITOT 1.0 1.1   PT/INR Lab Results  Component Value Date   INR 1.0 05/06/2021   INR 1.0 08/06/2020   INR 1.1 06/01/2020   Hepatitis Panel No results for input(s): HEPBSAG, HCVAB, HEPAIGM, HEPBIGM in the last 72 hours. C-Diff No components found for: CDIFF Lipase     Component Value Date/Time   LIPASE 48 07/12/2021 1311    Drugs of Abuse     Component Value Date/Time   LABOPIA POS (A) 05/06/2015 1638   COCAINSCRNUR NEG 05/06/2015 1638   LABBENZ NEG 05/06/2015 1638   AMPHETMU NEG 05/06/2015 1638     RADIOLOGY STUDIES: CT ABDOMEN PELVIS W CONTRAST  Result Date: 07/12/2021 CLINICAL DATA:  Worsening diarrhea with abdominal pain, initial encounter EXAM: CT ABDOMEN AND PELVIS WITH CONTRAST TECHNIQUE: Multidetector CT imaging of the abdomen and pelvis was performed using the standard protocol following bolus administration of intravenous contrast. CONTRAST:  115m OMNIPAQUE IOHEXOL 350 MG/ML SOLN COMPARISON:  07/01/2021 MRI FINDINGS: Lower chest: Lung bases are free of acute infiltrate or sizable effusion. Herniation of some omental fat is noted through the esophageal hiatus. No definitive herniation of the stomach is seen. Hepatobiliary: Liver is well visualized. Postsurgical changes are noted. Fiducial markings are again noted. Prior treated lesion is again seen and stable. The left lobe lesion marked by the fiducial markers is less well visualized than that on recent MRI. No new focal lesion is seen. Pancreas: Unremarkable. No pancreatic ductal dilatation or surrounding inflammatory changes. Spleen: Normal in size without focal abnormality. Adrenals/Urinary Tract: Adrenal glands are within normal limits. Kidneys demonstrate a normal enhancement pattern bilaterally. No renal calculi or obstructive changes are seen. Ureters are within normal limits. The bladder is decompressed.  Stomach/Bowel: Diverticular change of the colon is noted without definitive diverticulitis. No obstructive or inflammatory changes are seen. The appendix is well visualized and within normal limits. Small bowel and stomach are unremarkable. Vascular/Lymphatic: Aortic atherosclerosis. No enlarged abdominal or pelvic lymph nodes. Reproductive: Prostate is unremarkable. Other: Slight increase in ascites is noted when compared with the prior MRI. No free air is noted. Musculoskeletal: Degenerative changes of lumbar spine are noted. IMPRESSION: Known hepatic lesions appear stable from recent MRI. The left lobe lesion is less well visualized than on recent MRI. Slight increase in the degree of ascites when compare with the  prior study. Diverticular change without definitive diverticulitis. Electronically Signed   By: Inez Catalina M.D.   On: 07/12/2021 23:48      IMPRESSION:    Watery,  non-bloody Diarrhea for close to 1 month.  Stool PCR study negative.  No associated abdominal pain.  Chronic anorexia, no nausea/vomiting.  CTAP demonstrates only colonic diverticulosis but no colitis, diverticulitis or enteritis.  Its been 6 weeks or more since last radiation treatment to address recurrent hepatocellular carcinoma.     Cirrhosis of the liver attributed to remote alcohol abuse though other etiologies not worked up.  HCV, hepatitis B surface Ag, hepatitis B core total  Ab negative.  Hepatocellular carcinoma treated with SBRT in 2021 and recurrence treated again with SBRT in July 2022.    PLAN:       Colonoscopy and EGD for tomorrow.  Time TBD.  Pt willing to undergo and would like to know what is causing the diarrhea and what can be done to resolve this.  Orders for split dose movie prep and clear liquid diet ordered.   Azucena Freed  07/13/2021, 9:27 AM Phone [phone]  I have taken a history, reviewed the chart and examined the patient. I performed a substantive portion of this encounter, including  complete performance of at least one of the key components, in conjunction with the APP. I agree with the APP's note, impression and recommendations   80 year old male with history of CHF, cirrhosis with [place] s/p SBRT, DM and a history of chronic intermittent diarrhea for many years, but with 4 weeks of constant diarrhea resulting in profound fatigue for the patient, prompting admission for hydration and expedited evaluation.  Reassuringly, his labs including electrolytes, renal function and CBC were not significantly abnormal.  Stool studies negative for infectious cause and CT showed no bowel wall thickening.  No history of flushing or bronchospasm.  History of chronic watery diarrhea with urgency/incontinence without abdominal pain/n/v suggestive of microscopic colitis.  Will plan for colonoscopy tomorrow to evaluate for [facility].  Will also perform EGD for variceal screening and to assess chronic anemia.  Diarrhea, chronic, severe, unlikely to be inflammatory or infectious - Colonoscopy tomorrow - Clear liquids today, NPO except bowel prep after midnight - Further recommendations for management of diarrhea following colonoscopy.  Cirrhosis/HCC - EGD for variceal screening  Anemia, chronic macrocytic - EGD with gastric biopsies as above  Hadasah Brugger E. Candis Schatz, MD [facility]

## 2021-07-14 ENCOUNTER — Inpatient Hospital Stay (HOSPITAL_COMMUNITY): Payer: Medicare HMO | Admitting: Anesthesiology

## 2021-07-14 ENCOUNTER — Encounter (HOSPITAL_COMMUNITY): Payer: Self-pay | Admitting: Internal Medicine

## 2021-07-14 ENCOUNTER — Ambulatory Visit: Payer: Medicare HMO | Admitting: Nurse Practitioner

## 2021-07-14 ENCOUNTER — Encounter (HOSPITAL_COMMUNITY)
Admission: EM | Disposition: A | Discharge status: Home Health | Payer: Self-pay | Source: Home / Self Care | Attending: Internal Medicine

## 2021-07-14 DIAGNOSIS — K7460 Unspecified cirrhosis of liver: Secondary | ICD-10-CM

## 2021-07-14 DIAGNOSIS — K7469 Other cirrhosis of liver: Secondary | ICD-10-CM

## 2021-07-14 DIAGNOSIS — I8510 Secondary esophageal varices without bleeding: Secondary | ICD-10-CM

## 2021-07-14 DIAGNOSIS — K766 Portal hypertension: Secondary | ICD-10-CM | POA: Diagnosis not present

## 2021-07-14 DIAGNOSIS — E440 Moderate protein-calorie malnutrition: Secondary | ICD-10-CM | POA: Insufficient documentation

## 2021-07-14 DIAGNOSIS — R197 Diarrhea, unspecified: Secondary | ICD-10-CM | POA: Diagnosis not present

## 2021-07-14 DIAGNOSIS — K3189 Other diseases of stomach and duodenum: Secondary | ICD-10-CM | POA: Diagnosis not present

## 2021-07-14 DIAGNOSIS — R531 Weakness: Secondary | ICD-10-CM | POA: Diagnosis not present

## 2021-07-14 HISTORY — PX: COLONOSCOPY WITH PROPOFOL: SHX5780

## 2021-07-14 HISTORY — PX: ESOPHAGOGASTRODUODENOSCOPY (EGD) WITH PROPOFOL: SHX5813

## 2021-07-14 HISTORY — PX: BIOPSY: SHX5522

## 2021-07-14 LAB — BASIC METABOLIC PANEL
Anion gap: 7 (ref 5–15)
BUN: 12 mg/dL (ref 8–23)
CO2: 24 mmol/L (ref 22–32)
Calcium: 8.6 mg/dL — ABNORMAL LOW (ref 8.9–10.3)
Chloride: 106 mmol/L (ref 98–111)
Creatinine, Ser: 0.87 mg/dL (ref 0.61–1.24)
GFR, Estimated: >60 mL/min (ref >60)
Glucose, Bld: 147 mg/dL — ABNORMAL HIGH (ref 70–99)
Potassium: 4 mmol/L (ref 3.5–5.1)
Sodium: 137 mmol/L (ref 135–145)

## 2021-07-14 LAB — GLUCOSE, CAPILLARY
Glucose-Capillary: 113 mg/dL — ABNORMAL HIGH (ref 70–99)
Glucose-Capillary: 121 mg/dL — ABNORMAL HIGH (ref 70–99)
Glucose-Capillary: 138 mg/dL — ABNORMAL HIGH (ref 70–99)
Glucose-Capillary: 96 mg/dL (ref 70–99)

## 2021-07-14 SURGERY — COLONOSCOPY WITH PROPOFOL
Anesthesia: Monitor Anesthesia Care

## 2021-07-14 MED ORDER — PHENYLEPHRINE HCL-NACL 20-0.9 MG/250ML-% IV SOLN
INTRAVENOUS | Status: DC | PRN
  Administered 2021-07-14: 40 ug/min via INTRAVENOUS

## 2021-07-14 MED ORDER — PROSOURCE PLUS PO LIQD
30.0000 mL | Freq: Three times a day (TID) | ORAL | Status: DC
  Administered 2021-07-14 – 2021-07-19 (×14): 30 mL via ORAL
  Filled 2021-07-14 (×12): qty 30

## 2021-07-14 MED ORDER — BOOST / RESOURCE BREEZE PO LIQD CUSTOM
1.0000 | Freq: Three times a day (TID) | ORAL | Status: DC
  Administered 2021-07-14 – 2021-07-19 (×13): 1 via ORAL
  Filled 2021-07-14: qty 1

## 2021-07-14 MED ORDER — ADULT MULTIVITAMIN W/MINERALS CH
1.0000 | ORAL_TABLET | Freq: Every day | ORAL | Status: DC
  Administered 2021-07-14 – 2021-07-19 (×6): 1 via ORAL
  Filled 2021-07-14 (×6): qty 1

## 2021-07-14 MED ORDER — LIDOCAINE HCL (CARDIAC) PF 100 MG/5ML IV SOSY
PREFILLED_SYRINGE | INTRAVENOUS | Status: DC | PRN
  Administered 2021-07-14: 60 mg via INTRAVENOUS

## 2021-07-14 MED ORDER — PROPOFOL 500 MG/50ML IV EMUL
INTRAVENOUS | Status: DC | PRN
  Administered 2021-07-14: 100 ug/kg/min via INTRAVENOUS

## 2021-07-14 MED ORDER — LOPERAMIDE HCL 2 MG PO CAPS: 4.0000 mg | ORAL_CAPSULE | Freq: Three times a day (TID) | ORAL | Status: DC | PRN

## 2021-07-14 NOTE — Anesthesia Procedure Notes (Signed)
Procedure Name: MAC Date/Time: 07/14/2021 11:02 AM Performed by: Lieutenant Diego, CRNA Pre-anesthesia Checklist: Patient identified, Emergency Drugs available, Suction available, Patient being monitored and Timeout performed Patient Re-evaluated:Patient Re-evaluated prior to induction Oxygen Delivery Method: Nasal cannula Preoxygenation: Pre-oxygenation with 100% oxygen Induction Type: IV induction

## 2021-07-14 NOTE — Plan of Care (Signed)

## 2021-07-14 NOTE — Progress Notes (Signed)
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  GOAL:   Patient will meet greater than or equal to 90% of their needs  MONITOR:   PO intake, Supplement acceptance, Labs, Weight trends, Skin, I & O's  REASON FOR ASSESSMENT:   Malnutrition Screening Tool    ASSESSMENT:   80 year old white male with a history of hepatocellular carcinoma, diabetes type 2, CKD stage III, COPD, interstitial lung disease, hypertension who presents to the ER today with a 4-week history of chronic diarrhea.  Patient has been seen by GI.  Patient's has had an outpatient GI diarrhea panel performed which was negative for infectious cause of diarrhea.  Patient states his metformin was also discontinued temporarily which had no effect on his diarrhea.  Patient states that he is having 5-7 episodes of diarrhea per day.  Patient states that he is wearing adult undergarments.  He states that he is having fecal incontinence.  Patient feels dehydrated.  Patient was urged to come to the ER by his family earlier this week but declined.  Patient presented to the ER today after urging By his family.  Pt admitted with generalized weakness.   Reviewed I/O's: +100 ml x 24 hours  Pt just returned from EGD and colonoscopy. He is on a heart healthy diet and RD assisted him with setting up lunch tray.   Spoke with pt at bedside, who reports a general decline in health over the past 4 weeks, which he  attributes to contestant diarrhea. He shares that diarrhea has worsened over time, especially when eating ("everything ran right through me"). He reports he was trying to drink liquids, such as water to stay hydrated.   Per pt, he estimates he has lost 10-12# over the past month. His UBW is 222#. Reviewed wt hx; pt has experienced a 6.3% wt loss over the past 3 months. While this is not significant for time frame, it is concerning given advanced age and prolonged poor oral intake. Suspect acute illness may be exacerbating degree of malnutrition.   Discussed importance of good meal and supplement intake to promote healing. Pt has tried Ensure supplements (he is the primary caregiver of his wife with dementia); he does not like Ensure, but open to other supplements.   Medications reviewed and include lactated ringers infusion @ 100 ml/hr.   Lab Results  Component Value Date   HGBA1C 5.2 07/13/2021   PTA DM medications are 500 mg metformin TID with meals.   Labs reviewed: Na: 133, CBGS: 96-157 (inpatient orders for glycemic control are 0-5 units insulin aspart daily at bedtime and 0-9 units insulin aspart TID with meals).    NUTRITION - FOCUSED PHYSICAL EXAM:  Flowsheet Row Most Recent Value  Orbital Region Moderate depletion  Upper Arm Region Mild depletion  Thoracic and Lumbar Region No depletion  Buccal Region Mild depletion  Temple Region Mild depletion  Clavicle Bone Region Mild depletion  Clavicle and Acromion Bone Region Mild depletion  Scapular Bone Region Mild  depletion  Dorsal Hand Moderate depletion  Patellar Region No depletion  Anterior Thigh Region No depletion  Posterior Calf Region No depletion  Edema (RD Assessment) Mild  Hair Reviewed  Eyes Reviewed  Mouth Reviewed  Skin Reviewed  Nails Reviewed       Diet Order:   Diet Order             Diet Heart Room service appropriate? Yes; Fluid consistency: Thin  Diet effective now                   EDUCATION  NEEDS:   Education needs have been addressed  Skin:  Skin Assessment: Reviewed RN Assessment  Last BM:  07/14/21  Height:   Ht Readings from Last 1 Encounters:  07/14/21 5\' 11"  (1.803 m)    Weight:   Wt Readings from Last 1 Encounters:  07/14/21 89.4 kg    Ideal Body Weight:  78.2 kg  BMI:  Body mass index is 27.48 kg/m.  Estimated Nutritional Needs:   Kcal:  1950-2150  Protein:  105-120 grams  Fluid:  > 1.9 L    Loistine Chance, RD, LDN, [place] Registered Dietitian II Certified Diabetes Care and Education Specialist Please refer to [facility] for RD and/or RD on-call/weekend/after hours pager

## 2021-07-14 NOTE — Op Note (Signed)
[facility] Patient Name: [name] Procedure Date : 07/14/2021 MRN: [id number] Attending MD: Gladstone Pih. Candis Schatz , MD Date of Birth: [birth year] CSN: [id number] Age: 80 Admit Type: Inpatient Procedure:                Upper GI endoscopy Indications:              Iron deficiency anemia, Cirrhosis rule out                            esophageal varices Providers:                Nicki Reaper E. Candis Schatz, MD, Doristine Johns, RN,                            Jaci Carrel, RN, Tyrone Apple, Technician,                            April C., CRNA Referring MD:              Medicines:                Monitored Anesthesia Care Complications:            No immediate complications. Estimated Blood Loss:     Estimated blood loss was minimal. Procedure:                Pre-Anesthesia Assessment:                           - Prior to the procedure, a History and Physical                            was performed, and patient medications and                            allergies were reviewed. The patient's tolerance of                            previous anesthesia was also reviewed. The risks                            and benefits of the procedure and the sedation                            options and risks were discussed with the patient.                            All questions were answered, and informed consent                            was obtained. Prior Anticoagulants: The patient has                            taken no previous anticoagulant or antiplatelet                            agents  except for aspirin. ASA Grade Assessment:                            III - A patient with severe systemic disease. After                            reviewing the risks and benefits, the patient was                            deemed in satisfactory condition to undergo the                            procedure.                           After obtaining informed consent, the endoscope was                             passed under direct vision. Throughout the                            procedure, the patient's blood pressure, pulse, and                            oxygen saturations were monitored continuously. The                            [id number]-[id number] ([id number] Olympus endoscope was introduced                            through the mouth, and advanced to the second part                            of duodenum. The upper GI endoscopy was                            accomplished without difficulty. The patient                            tolerated the procedure well. Scope In: Scope Out: Findings:      Grade I varices were found in the lower third of the esophagus.      The exam of the esophagus was otherwise normal.      Mild portal hypertensive gastropathy was found in the entire examined       stomach. Biopsies were taken with a cold forceps for Helicobacter pylori       testing. Estimated blood loss was minimal.      A focal flat lesion characterized by smoothness and absence of mucosa       were found in the gastric body. Submucosa not visible. The edges of the       lesion were nodular and irregular. Biopsies were taken with a cold       forceps for histology in the middle of the lesion and on the border. The       lesion was friable and  bled briskly but stopped spontaneously without       intervention. Only two tissue samples were obtained because of       friability. Estimated blood loss was minimal.      The exam of the stomach was otherwise normal.      The examined duodenum was normal. Biopsies for histology were taken with       a cold forceps for evaluation of celiac disease. Estimated blood loss       was minimal. Impression:               - Grade I esophageal varices. No stigmata. Do not                            recommend banding or nonselective beta blockers                           - Portal hypertensive gastropathy. Biopsied.                           - Focal  flat lesion in the gastric body. This could                            be consistent with a healing gastric ulcer, but                            appearance was atypical. Biopsied to rule out                            dysplasia.                           - Normal examined duodenum. Biopsied. Recommendation:           - Return patient to hospital ward for ongoing care.                           - Resume regular diet.                           - Continue present medications.                           - Await pathology results.                           - Proceed with colonoscopy. Procedure Code(s):        --- Professional ---                           [phone], Esophagogastroduodenoscopy, flexible,                            transoral; with biopsy, single or multiple Diagnosis Code(s):        --- Professional ---                           K74.60, Unspecified cirrhosis of liver  I85.10, Secondary esophageal varices without                            bleeding                           K76.6, Portal hypertension                           K31.89, Other diseases of stomach and duodenum                           D50.9, Iron deficiency anemia, unspecified CPT copyright 2019 American [employer]. All rights reserved. The codes documented in this report are preliminary and upon coder review may  be revised to meet current compliance requirements. Ashlyn Cabler E. Candis Schatz, MD 07/14/2021 12:02:56 PM This report has been signed electronically. Number of Addenda: 0

## 2021-07-14 NOTE — Interval H&P Note (Signed)
History and Physical Interval Note:  No changes in patient's symptoms or medical status overnight.  Plan for EGD for variceal screening, anemia evaluation and Colonoscopy for evaluation of diarrhea, anemia and polyp surveillance.  07/14/2021 10:27 AM  [name]  has presented today for surgery, with the diagnosis of Diarrhea.  Weight loss.  Anorexia.  Cirrhosis of liver.  Hepatocellular carcinoma..  The various methods of treatment have been discussed with the patient and family. After consideration of risks, benefits and other options for treatment, the patient has consented to  Procedure(s): COLONOSCOPY WITH PROPOFOL (N/A) ESOPHAGOGASTRODUODENOSCOPY (EGD) WITH PROPOFOL (N/A) as a surgical intervention.  The patient's history has been reviewed, patient examined, no change in status, stable for surgery.  I have reviewed the patient's chart and labs.  Questions were answered to the patient's satisfaction.     Daryel November

## 2021-07-14 NOTE — Evaluation (Signed)
Occupational Therapy Evaluation Patient Details Name: [name] MRN: [id number] DOB: [birth year] Today's Date: 07/14/2021   History of Present Illness Pt is an 80 y/o male admitted 10/3 secondary to diarrhea and weakness. PMH includes hepatocellular carcinoma, DM, CKD, COPD, and HTN.   Clinical Impression   Patient admitted for the diagnosis above.   PTA he lives with his spouse, whom he cares for.  Family is in the area, and they do provide assist as needed.  Deficits impacting independence are listed below.  Currently he is needing up to [facility] for in room mobility/toileting at [facility] level, and lower body ADL.  He is scheduled for a colonoscopy today, and unfortunately was up most of the night due to the colon prep.  He is tired, but should recover well.  OT will follow in the acute setting to maximize his functional status, but no post acute OT is anticipated.         Recommendations for follow up therapy are one component of a multi-disciplinary discharge planning process, led by the attending physician.  Recommendations may be updated based on patient status, additional functional criteria and insurance authorization.   Follow Up Recommendations  No OT follow up    Equipment Recommendations  None recommended by OT    Recommendations for Other Services       Precautions / Restrictions Precautions Precautions: Fall Restrictions Weight Bearing Restrictions: No      Mobility Bed Mobility Overal bed mobility: Needs Assistance Bed Mobility: Supine to Sit;Sit to Supine     Supine to sit: Modified independent (Device/Increase time) Sit to supine: Modified independent (Device/Increase time)        Transfers Overall transfer level: Needs assistance Equipment used: Straight cane Transfers: Sit to/from [facility] Sit to Stand: Min guard Stand pivot transfers: Min guard            Balance Overall balance assessment: Needs assistance Sitting-balance  support: No upper extremity supported;Feet supported Sitting balance-Leahy Scale: Good     Standing balance support: Single extremity supported;During functional activity Standing balance-Leahy Scale: Fair Standing balance comment: Reliant on at least one UE support                           ADL either performed or assessed with clinical judgement   ADL Overall ADL's : Needs assistance/impaired Eating/Feeding: NPO Eating/Feeding Details (indicate cue type and reason): awaiting colonoscopy Grooming: Wash/dry hands;Wash/dry face;Set up;Sitting   Upper Body Bathing: Set up;Sitting   Lower Body Bathing: Min guard;Sit to/from stand   Upper Body Dressing : Set up;Sitting   Lower Body Dressing: Min guard;Sit to/from stand   Toilet Transfer: Min guard;Ambulation;Regular Museum/gallery exhibitions officer and Hygiene: Supervision/safety;Sitting/lateral lean       Functional mobility during ADLs: Min guard;Cane       Vision Baseline Vision/History: 1 Wears glasses Patient Visual Report: No change from baseline                  Pertinent Vitals/Pain Pain Assessment: No/denies pain     Hand Dominance Right   Extremity/Trunk Assessment Upper Extremity Assessment Upper Extremity Assessment: Overall WFL for tasks assessed   Lower Extremity Assessment Lower Extremity Assessment: Defer to PT evaluation   Cervical / Trunk Assessment Cervical / Trunk Assessment: Kyphotic   Communication Communication Communication: No difficulties   Cognition Arousal/Alertness: Awake/alert Behavior During Therapy: WFL for tasks assessed/performed Overall Cognitive Status: Within Functional Limits  for tasks assessed                                                      Home Living Family/patient expects to be discharged to:: Private residence Living Arrangements: Spouse/significant other;Other (Comment) (spouse has dementia, and patient does  care for her) Available Help at Discharge: Family;Available 24 hours/day Type of Home: House Home Access: Ramped entrance     Home Layout: One level     Bathroom Shower/Tub: Teacher, early years/pre: Handicapped height Bathroom Accessibility: Yes How Accessible: Accessible via walker Home Equipment: Shower seat;Cane - single point;Walker - 4 wheels;Walker - 2 wheels          Prior Functioning/Environment Level of Independence: Independent with assistive device(s)        Comments: Uses cane for ambulation, needing no assist with ADL/ IADL.  Drives locally.        OT Problem List: Decreased activity tolerance;Impaired balance (sitting and/or standing)      OT Treatment/Interventions: Self-care/ADL training;Therapeutic exercise;Therapeutic activities;Balance training    OT Goals(Current goals can be found in the care plan section) Acute Rehab OT Goals Patient Stated Goal: I'm ready to get this test over. OT Goal Formulation: With patient Time For Goal Achievement: 07/28/21 Potential to Achieve Goals: Good ADL Goals Pt Will Perform Lower Body Bathing: with modified independence;sit to/from stand Pt Will Perform Lower Body Dressing: with modified independence;sit to/from stand Pt Will Transfer to Toilet: with modified independence;ambulating;regular height toilet Pt Will Perform Toileting - Clothing Manipulation and hygiene: with modified independence;sit to/from stand  OT Frequency: Min 2X/week   Barriers to D/C:  None noted          Co-evaluation              AM-PAC OT "6 Clicks" Daily Activity     Outcome Measure Help from another person eating meals?: None Help from another person taking care of personal grooming?: None Help from another person toileting, which includes using toliet, bedpan, or urinal?: A Little Help from another person bathing (including washing, rinsing, drying)?: A Little Help from another person to put on and taking off  regular upper body clothing?: None Help from another person to put on and taking off regular lower body clothing?: A Little 6 Click Score: 21   End of Session Equipment Utilized During Treatment: Other (comment) (SPC in the room)  Activity Tolerance: Patient limited by fatigue Patient left: in bed;with call bell/phone within reach;with nursing/sitter in room  OT Visit Diagnosis: Unsteadiness on feet (R26.81)                Time: 9528-4132 OT Time Calculation (min): 11 min Charges:  OT General Charges $OT Visit: 1 Visit OT Evaluation $OT Eval Moderate Complexity: 1 Mod  07/14/2021  RP, OTR/L  Acute Rehabilitation Services  Office:  [phone]   Metta Clines 07/14/2021, 9:11 AM

## 2021-07-14 NOTE — Transfer of Care (Signed)
Immediate Anesthesia Transfer of Care Note  Patient: [name]  Procedure(s) Performed: COLONOSCOPY WITH PROPOFOL ESOPHAGOGASTRODUODENOSCOPY (EGD) WITH PROPOFOL BIOPSY  Patient Location: Endoscopy Unit  Anesthesia Type:MAC  Level of Consciousness: drowsy  Airway & Oxygen Therapy: Patient Spontanous Breathing and Patient connected to nasal cannula oxygen  Post-op Assessment: Report given to RN and Post -op Vital signs reviewed and stable  Post vital signs: Reviewed and stable  Last Vitals:  Vitals Value Taken Time  BP 97/46 07/14/21 1155  Temp    Pulse 72 07/14/21 1156  Resp 15 07/14/21 1156  SpO2 99 % 07/14/21 1156  Vitals shown include unvalidated device data.  Last Pain:  Vitals:   07/14/21 1005  TempSrc: Oral  PainSc: 0-No pain         Complications: No notable events documented.

## 2021-07-14 NOTE — Anesthesia Preprocedure Evaluation (Addendum)
Anesthesia Evaluation  Patient identified by MRN, date of birth, ID band Patient awake    Reviewed: Allergy & Precautions, NPO status , Patient's Chart, lab work & pertinent test results, reviewed documented beta blocker date and time   History of Anesthesia Complications Negative for: history of anesthetic complications  Airway Mallampati: III  TM Distance: >3 FB Neck ROM: Full    Dental  (+) Edentulous Upper, Edentulous Lower   Pulmonary COPD, former smoker,    Pulmonary exam normal        Cardiovascular hypertension, Pt. on home beta blockers and Pt. on medications +CHF  Normal cardiovascular exam  TTE 06/2020: EF 50-55%, mild LVH, grade I DD, severe LAE, mild to moderate AS   Neuro/Psych Anxiety Depression negative neurological ROS     GI/Hepatic GERD  Medicated and Controlled,(+) Cirrhosis       ,   Endo/Other  diabetes, Type 2, Oral Hypoglycemic Agents  Renal/GU Renal InsufficiencyRenal disease  negative genitourinary   Musculoskeletal  (+) Arthritis ,   Abdominal   Peds  Hematology  (+) anemia , Hgb 9.7, plt 81k   Anesthesia Other Findings Day of surgery medications reviewed with patient.  Reproductive/Obstetrics negative OB ROS                            Anesthesia Physical Anesthesia Plan  ASA: 3  Anesthesia Plan: MAC   Post-op Pain Management:    Induction:   PONV Risk Score and Plan: 1 and Treatment may vary due to age or medical condition and Propofol infusion  Airway Management Planned: Natural Airway and Nasal Cannula  Additional Equipment: None  Intra-op Plan:   Post-operative Plan:   Informed Consent: I have reviewed the patients History and Physical, chart, labs and discussed the procedure including the risks, benefits and alternatives for the proposed anesthesia with the patient or authorized representative who has indicated his/her understanding and  acceptance.       Plan Discussed with: CRNA  Anesthesia Plan Comments:         Anesthesia Quick Evaluation

## 2021-07-14 NOTE — Anesthesia Postprocedure Evaluation (Signed)
Anesthesia Post Note  Patient: [name]  Procedure(s) Performed: COLONOSCOPY WITH PROPOFOL ESOPHAGOGASTRODUODENOSCOPY (EGD) WITH PROPOFOL BIOPSY     Patient location during evaluation: Endoscopy Anesthesia Type: MAC Level of consciousness: awake and alert Pain management: pain level controlled Vital Signs Assessment: post-procedure vital signs reviewed and stable Respiratory status: spontaneous breathing, nonlabored ventilation, respiratory function stable and patient connected to nasal cannula oxygen Cardiovascular status: stable and blood pressure returned to baseline Postop Assessment: no apparent nausea or vomiting Anesthetic complications: no   No notable events documented.  Last Vitals:  Vitals:   07/14/21 1215 07/14/21 1242  BP: (!) 107/47 (!) 106/58  Pulse: 69 69  Resp: 13 15  Temp:  36.4 C  SpO2: 99% 98%    Last Pain:  Vitals:   07/14/21 1242  TempSrc: Oral  PainSc:                  Catalina Gravel

## 2021-07-14 NOTE — Op Note (Signed)
[facility] Patient Name: [name] Procedure Date : 07/14/2021 MRN: [id number] Attending MD: Gladstone Pih. Candis Schatz , MD Date of Birth: December 23, 1940 CSN: [id number] Age: 80 Admit Type: Inpatient Procedure:                Colonoscopy Indications:              Clinically significant diarrhea of unexplained                            origin Providers:                Nicki Reaper E. Candis Schatz, MD, Doristine Johns, RN,                            Jaci Carrel, RN, Tyrone Apple, Technician Referring MD:              Medicines:                Monitored Anesthesia Care Complications:            No immediate complications. Estimated Blood Loss:     Estimated blood loss was minimal. Procedure:                Pre-Anesthesia Assessment:                           - Prior to the procedure, a History and Physical                            was performed, and patient medications and                            allergies were reviewed. The patient's tolerance of                            previous anesthesia was also reviewed. The risks                            and benefits of the procedure and the sedation                            options and risks were discussed with the patient.                            All questions were answered, and informed consent                            was obtained. Prior Anticoagulants: The patient has                            taken no previous anticoagulant or antiplatelet                            agents except for aspirin. ASA Grade Assessment:                            III -  A patient with severe systemic disease. After                            reviewing the risks and benefits, the patient was                            deemed in satisfactory condition to undergo the                            procedure.                           After obtaining informed consent, the colonoscope                            was passed under direct vision.  Throughout the                            procedure, the patient's blood pressure, pulse, and                            oxygen saturations were monitored continuously. The                            [id number] ([id number] Olympus coloscope was                            introduced through the anus and advanced to the the                            terminal ileum, with identification of the                            appendiceal orifice and IC valve. The colonoscopy                            was somewhat difficult due to multiple diverticula                            in the colon, poor endoscopic visualization,                            restricted mobility of the colon and a tortuous                            colon. Successful completion of the procedure was                            aided by changing the patient to a prone position,                            using manual pressure and withdrawing and  reinserting the scope. The patient tolerated the                            procedure well. The quality of the bowel                            preparation was adequate. Scope In: 11:25:06 AM Scope Out: 11:50:58 AM Scope Withdrawal Time: 0 hours 15 minutes 27 seconds  Total Procedure Duration: 0 hours 25 minutes 52 seconds  Findings:      Hemorrhoids were found on perianal exam.      The digital rectal exam was normal. Pertinent negatives include normal       sphincter tone and no palpable rectal lesions.      Many small and large-mouthed diverticula were found in the sigmoid       colon, descending colon, transverse colon and ascending colon. There was       no evidence of diverticular bleeding.      Normal mucosa was found in the entire colon. Biopsies for histology were       taken with a cold forceps from the ascending colon, transverse colon,       descending colon and sigmoid colon for evaluation of microscopic       colitis. Estimated blood loss was  minimal.      The retroflexed view of the distal rectum and anal verge was normal and       showed no anal or rectal abnormalities. Impression:               - Hemorrhoids found on perianal exam.                           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 There was no evidence of                            diverticular bleeding.                           - Normal mucosa in the entire examined colon.                            Biopsied.                           - The distal rectum and anal verge are normal on                            retroflexion view.                           - No endoscopic abnormalities were found to explain  patient's diarrhea. Await biopsy results for                            evidence of microscopic colitis. Recommendation:           - Return patient to hospital ward for ongoing care.                           - Resume previous diet.                           - Recommend taking Imodium 2 caplets PO TID for now                           - Further evaluation can be completed as outpatient                            if diarrhea not improving and biopsies negative for                            microscopic colitis.                           - Recommend against further colon cancer screening                            given no polyps found on this examination. Procedure Code(s):        --- Professional ---                           [phone], Colonoscopy, flexible; with biopsy, single                            or multiple Diagnosis Code(s):        --- Professional ---                           K64.9, Unspecified hemorrhoids                           R19.7, Diarrhea, unspecified                           K57.30, Diverticulosis of large intestine without                            perforation or abscess  without bleeding CPT copyright 2019 [employer] Medical [employer]. All rights reserved. The codes documented in this report are preliminary and upon coder review may  be revised to meet current compliance requirements. Champagne Paletta E. Candis Schatz, MD 07/14/2021 12:12:32 PM This report has been signed electronically. Number of Addenda: 0

## 2021-07-14 NOTE — Progress Notes (Signed)
PROGRESS NOTE    UTAH Frazier  ASN:[id number] DOB: 1941/04/28 DOA: 07/12/2021 PCP: [name], MD   Brief Narrative: 80 year old with past medical history significant for thrombocytopenia, type 2 diabetes, anemia, B12 deficiency, hepatocellular carcinoma treated with SBRT  who presents to the emergency room  with 1 month history of watery diarrhea. Recent follow-up with GI as an outpatient.  Recent C. difficile and GI pathogen negative.  He presented with generalized weakness. Underwent endoscopy and colonoscopy.  Awaiting colonoscopy results.     Assessment & Plan:   Principal Problem:   Generalized weakness Active Problems:   DM (diabetes mellitus) type II controlled, neurological manifestation (HCC)   Essential hypertension   COPD (chronic obstructive pulmonary disease) (HCC)   CKD (chronic kidney disease), stage III (HCC)   Hepatocellular carcinoma (HCC)   Chronic diarrhea   Diarrhea due to malabsorption   Cirrhosis of liver without ascites (HCC)   Mucosal abnormality of stomach   Portal hypertensive gastropathy (HCC)   Secondary esophageal varices without bleeding (HCC)   Malnutrition of moderate degree   1-generalized weakness: Related to poor oral intake and acute illness.  PT   2-Chronic diarrhea: -CT abdomen: Known hepatic lesion appears stable from recent MRI.  Slightly increased degrees of ascites when compared to prior study. C. difficile negative out patient.  Underwent endoscopy and colonoscopy: Endoscopy has a small flat lesion in the stomach, biopsies obtained.  Colonoscopy diverticulosis, biopsy obtained to rule out microscopic colitis GI recommended to schedule Imodium. Hold IV fluids.   Hepatocellular carcinoma: Chronic  Diabetes type 2: Stable  Hypertension: On carvedilol.   COPD: Stable  CKD stage IIIa   History of systolic heart failure, ejection fraction 11/2019 25%, repeated echo 05/2020, EF 55 % On entresto, hold while having  diarrhea.  Resume entresto tomorrow depending on Diarrhea.       Nutrition Problem: Moderate Malnutrition Etiology: chronic illness (COPD, ILD)    Signs/Symptoms: energy intake < or equal to 75% for > or equal to 1 month, mild fat depletion, moderate fat depletion, mild muscle depletion, moderate muscle depletion    Interventions: Boost Breeze, MVI, Prostat  Estimated body mass index is 27.48 kg/m as calculated from the following:   Height as of this encounter: 5\' 11"  (1.803 m).   Weight as of this encounter: 89.4 kg.   DVT prophylaxis: SCD Code Status: Full Code Family Communication: care discussed with patient.  Disposition Plan:  Status is: Inpatient  Remains inpatient appropriate because:Inpatient level of care appropriate due to severity of illness  Dispo: The patient is from: Home              Anticipated d/c is to: Home              Patient currently is not medically stable to d/c.   Difficult to place patient No        Consultants:  GI  Procedures:  Endoscopy Colonoscopy   Antimicrobials:    Subjective: He is alert, he was walking in the hall with assistance of PT He report 1 month history of diarrhea, watery   Objective: Vitals:   07/14/21 1155 07/14/21 1215 07/14/21 1242 07/14/21 1553  BP: (!) 97/46 (!) 107/47 (!) 106/58 108/62  Pulse: 72 69 69 90  Resp: 15 13 15 18   Temp:   97.6 F (36.4 C) 97.7 F (36.5 C)  TempSrc:   Oral Oral  SpO2: 99% 99% 98% 94%  Weight:      Height:  Intake/Output Summary (Last 24 hours) at 07/14/2021 1637 Last data filed at 07/14/2021 1400 Gross per 24 hour  Intake 340 ml  Output --  Net 340 ml   Filed Weights   07/12/21 1312 07/14/21 1005  Weight: 89.4 kg 89.4 kg    Examination:  General exam: Appears calm and comfortable  Respiratory system: Clear to auscultation. Respiratory effort normal. Cardiovascular system: S1 & S2 heard, RRR. No JVD, murmurs, rubs, gallops or clicks. No pedal  edema. Gastrointestinal system: Abdomen is nondistended, soft and nontender. No organomegaly or masses felt. Normal bowel sounds heard. Central nervous system: Alert and oriented.  Extremities: Symmetric 5 x 5 power.    Data Reviewed: I have personally reviewed following labs and imaging studies  CBC: Recent Labs  Lab 07/12/21 1311 07/13/21 0318  WBC 5.3 3.9*  NEUTROABS 4.2 2.6  HGB 11.0* 9.7*  HCT 32.8* 28.4*  MCV 103.8* 102.9*  PLT 97* 81*   Basic Metabolic Panel: Recent Labs  Lab 07/12/21 1311 07/13/21 0318 07/14/21 1337  NA 133* 133* 137  K 4.4 4.2 4.0  CL 104 104 106  CO2 21* 22 24  GLUCOSE 121* 94 147*  BUN 10 11 12   CREATININE 0.87 0.86 0.87  CALCIUM 8.8* 8.4* 8.6*  MG 1.3* 1.9  --    GFR: Estimated Creatinine Clearance: 72.1 mL/min (by C-G formula based on SCr of 0.87 mg/dL). Liver Function Tests: Recent Labs  Lab 07/12/21 1311 07/13/21 0318  AST 30 27  ALT 16 13  ALKPHOS 82 82  BILITOT 1.0 1.1  PROT 6.0* 5.3*  ALBUMIN 3.1* 2.7*   Recent Labs  Lab 07/12/21 1311  LIPASE 48   No results for input(s): AMMONIA in the last 168 hours. Coagulation Profile: No results for input(s): INR, PROTIME in the last 168 hours. Cardiac Enzymes: No results for input(s): CKTOTAL, CKMB, CKMBINDEX, TROPONINI in the last 168 hours. BNP (last 3 results) No results for input(s): PROBNP in the last 8760 hours. HbA1C: Recent Labs    07/13/21 0318  HGBA1C 5.2   CBG: Recent Labs  Lab 07/13/21 0712 07/13/21 1115 07/13/21 2031 07/14/21 0843 07/14/21 1244  GLUCAP 96 124* 157* 113* 121*   Lipid Profile: No results for input(s): CHOL, HDL, LDLCALC, TRIG, CHOLHDL, LDLDIRECT in the last 72 hours. Thyroid Function Tests: No results for input(s): TSH, T4TOTAL, FREET4, T3FREE, THYROIDAB in the last 72 hours. Anemia Panel: No results for input(s): VITAMINB12, FOLATE, FERRITIN, TIBC, IRON, RETICCTPCT in the last 72 hours. Sepsis Labs: No results for input(s):  PROCALCITON, LATICACIDVEN in the last 168 hours.  Recent Results (from the past 240 hour(s))  Resp Panel by RT-PCR (Flu A&B, Covid) Nasopharyngeal Swab     Status: None   Collection Time: 07/12/21 10:18 PM   Specimen: Nasopharyngeal Swab; Nasopharyngeal(NP) swabs in vial transport medium  Result Value Ref Range Status   SARS Coronavirus 2 by RT PCR NEGATIVE NEGATIVE Final    Comment: (NOTE) SARS-CoV-2 target nucleic acids are NOT DETECTED.  The SARS-CoV-2 RNA is generally detectable in upper respiratory specimens during the acute phase of infection. The lowest concentration of SARS-CoV-2 viral copies this assay can detect is 138 copies/mL. A negative result does not preclude SARS-Cov-2 infection and should not be used as the sole basis for treatment or other patient management decisions. A negative result may occur with  improper specimen collection/handling, submission of specimen other than nasopharyngeal swab, presence of viral mutation(s) within the areas targeted by this assay, and inadequate number of  viral copies(<138 copies/mL). A negative result must be combined with clinical observations, patient history, and epidemiological information. The expected result is Negative.  Fact Sheet for Patients:  EntrepreneurPulse.com.au  Fact Sheet for Healthcare Providers:  IncredibleEmployment.be  This test is no t yet approved or cleared by the [place] FDA and  has been authorized for detection and/or diagnosis of SARS-CoV-2 by FDA under an Emergency Use Authorization (EUA). This EUA will remain  in effect (meaning this test can be used) for the duration of the COVID-19 declaration under Section 564(b)(1) of the Act, 21 U.S.C.section 360bbb-3(b)(1), unless the authorization is terminated  or revoked sooner.       Influenza A by PCR NEGATIVE NEGATIVE Final   Influenza B by PCR NEGATIVE NEGATIVE Final    Comment: (NOTE) The Xpert Xpress  SARS-CoV-2/FLU/RSV plus assay is intended as an aid in the diagnosis of influenza from Nasopharyngeal swab specimens and should not be used as a sole basis for treatment. Nasal washings and aspirates are unacceptable for Xpert Xpress SARS-CoV-2/FLU/RSV testing.  Fact Sheet for Patients: EntrepreneurPulse.com.au  Fact Sheet for Healthcare Providers: IncredibleEmployment.be  This test is not yet approved or cleared by the [place] FDA and has been authorized for detection and/or diagnosis of SARS-CoV-2 by FDA under an Emergency Use Authorization (EUA). This EUA will remain in effect (meaning this test can be used) for the duration of the COVID-19 declaration under Section 564(b)(1) of the Act, 21 U.S.C. section 360bbb-3(b)(1), unless the authorization is terminated or revoked.  Performed at [facility], [place] [address] [place], [place] [address]          Radiology Studies: CT ABDOMEN PELVIS W CONTRAST  Result Date: 07/12/2021 CLINICAL DATA:  Worsening diarrhea with abdominal pain, initial encounter EXAM: CT ABDOMEN AND PELVIS WITH CONTRAST TECHNIQUE: Multidetector CT imaging of the abdomen and pelvis was performed using the standard protocol following bolus administration of intravenous contrast. CONTRAST:  124mL OMNIPAQUE IOHEXOL 350 MG/ML SOLN COMPARISON:  07/01/2021 MRI FINDINGS: Lower chest: Lung bases are free of acute infiltrate or sizable effusion. Herniation of some omental fat is noted through the esophageal hiatus. No definitive herniation of the stomach is seen. Hepatobiliary: Liver is well visualized. Postsurgical changes are noted. Fiducial markings are again noted. Prior treated lesion is again seen and stable. The left lobe lesion marked by the fiducial markers is less well visualized than that on recent MRI. No new focal lesion is seen. Pancreas: Unremarkable. No pancreatic ductal dilatation or surrounding inflammatory  changes. Spleen: Normal in size without focal abnormality. Adrenals/Urinary Tract: Adrenal glands are within normal limits. Kidneys demonstrate a normal enhancement pattern bilaterally. No renal calculi or obstructive changes are seen. Ureters are within normal limits. The bladder is decompressed. Stomach/Bowel: Diverticular change of the colon is noted without definitive diverticulitis. No obstructive or inflammatory changes are seen. The appendix is well visualized and within normal limits. Small bowel and stomach are unremarkable. Vascular/Lymphatic: Aortic atherosclerosis. No enlarged abdominal or pelvic lymph nodes. Reproductive: Prostate is unremarkable. Other: Slight increase in ascites is noted when compared with the prior MRI. No free air is noted. Musculoskeletal: Degenerative changes of lumbar spine are noted. IMPRESSION: Known hepatic lesions appear stable from recent MRI. The left lobe lesion is less well visualized than on recent MRI. Slight increase in the degree of ascites when compare with the prior study. Diverticular change without definitive diverticulitis. Electronically Signed   By: Inez Catalina M.D.   On: 07/12/2021 23:48  Scheduled Meds:  (feeding supplement) PROSource Plus  30 mL Oral TID BM   acyclovir  400 mg Oral BID   aspirin EC  81 mg Oral Daily   carvedilol  6.25 mg Oral BID WC   escitalopram  5 mg Oral Daily   feeding supplement  1 Container Oral TID BM   finasteride  5 mg Oral Daily   gabapentin  300 mg Oral BID   insulin aspart  0-5 Units Subcutaneous QHS   insulin aspart  0-9 Units Subcutaneous TID WC   multivitamin with minerals  1 tablet Oral Daily   sacubitril-valsartan  1 tablet Oral BID   tamsulosin  0.4 mg Oral Daily   Continuous Infusions:  lactated ringers 100 mL/hr at 07/14/21 1056     LOS: 1 day    Time spent: [place], MD [facility]   If 7PM-7AM, please contact  night-coverage [email or web address]  07/14/2021, 4:37 PM

## 2021-07-14 NOTE — Progress Notes (Signed)
Physical Therapy Treatment Patient Details Name: [name] MRN: [id number] DOB: 1941-06-04 Today's Date: 07/14/2021   History of Present Illness Pt is an 80 y/o male admitted 10/3 secondary to diarrhea and weakness. PMH includes hepatocellular carcinoma, DM, CKD, COPD, and HTN.    PT Comments    Pt tolerates treatment well, demonstrating improved stability with use of walker for ambulation at this time. Pt with generalized weakness and imbalance, resulting in increased sway during gait when holding only cane. Pt verbalizes agreement with plan for use of RW for all mobility upon return home, however declines all PT follow-up at the time of discharge.   Recommendations for follow up therapy are one component of a multi-disciplinary discharge planning process, led by the attending physician.  Recommendations may be updated based on patient status, additional functional criteria and insurance authorization.  Follow Up Recommendations  Other (comment) (pt declining HHPT or outpatient PT)     Equipment Recommendations  None recommended by PT (pt owns RW)    Recommendations for Other Services       Precautions / Restrictions Precautions Precautions: Fall Restrictions Weight Bearing Restrictions: No     Mobility  Bed Mobility                    Transfers Overall transfer level: Needs assistance Equipment used: Rolling walker (2 wheeled);Straight cane Transfers: Sit to/from Stand Sit to Stand: Supervision            Ambulation/Gait Ambulation/Gait assistance: Min guard;Supervision Gait Distance (Feet): 100 Feet (additional trial of 38' with RW) Assistive device: Straight cane;Rolling walker (2 wheeled) Gait Pattern/deviations: Step-through pattern Gait velocity: reduced Gait velocity interpretation: 1.31 - 2.62 ft/sec, indicative of limited community ambulator General Gait Details: pt with slowed step-through gait, preference for use of railing to steady when  ambulating with cane. Pt with improve balance and gait speed with BUE support of RW   Stairs             Wheelchair Mobility    Modified Rankin (Stroke Patients Only)       Balance Overall balance assessment: Needs assistance Sitting-balance support: No upper extremity supported;Feet supported Sitting balance-Leahy Scale: Good     Standing balance support: Single extremity supported;Bilateral upper extremity supported Standing balance-Leahy Scale: Poor Standing balance comment: reliant on UE support of assistive device                            Cognition Arousal/Alertness: Awake/alert Behavior During Therapy: WFL for tasks assessed/performed Overall Cognitive Status: Within Functional Limits for tasks assessed                                        Exercises      General Comments General comments (skin integrity, edema, etc.): VSS on RA      Pertinent Vitals/Pain Pain Assessment: No/denies pain    Home Living                      Prior Function            PT Goals (current goals can now be found in the care plan section) Acute Rehab PT Goals Patient Stated Goal: to go home Progress towards PT goals: Progressing toward goals    Frequency    Min 3X/week  PT Plan Current plan remains appropriate    Co-evaluation              AM-PAC PT "6 Clicks" Mobility   Outcome Measure  Help needed turning from your back to your side while in a flat bed without using bedrails?: None Help needed moving from lying on your back to sitting on the side of a flat bed without using bedrails?: None Help needed moving to and from a bed to a chair (including a wheelchair)?: A Little Help needed standing up from a chair using your arms (e.g., wheelchair or bedside chair)?: A Little Help needed to walk in hospital room?: A Little Help needed climbing 3-5 steps with a railing? : A Little 6 Click Score: 20    End of  Session   Activity Tolerance: Patient tolerated treatment well Patient left: in bed;with call bell/phone within reach Nurse Communication: Mobility status PT Visit Diagnosis: Unsteadiness on feet (R26.81);Muscle weakness (generalized) (M62.81)     Time: [id number] PT Time Calculation (min) (ACUTE ONLY): 13 min  Charges:  $Gait Training: 8-22 mins                     Zenaida Niece, PT, DPT Acute Rehabilitation Pager: [phone]    [name] 07/14/2021, 2:38 PM

## 2021-07-15 ENCOUNTER — Inpatient Hospital Stay (HOSPITAL_COMMUNITY): Payer: Medicare HMO

## 2021-07-15 ENCOUNTER — Encounter (HOSPITAL_COMMUNITY): Payer: Self-pay | Admitting: Gastroenterology

## 2021-07-15 ENCOUNTER — Telehealth: Payer: Self-pay

## 2021-07-15 DIAGNOSIS — K766 Portal hypertension: Secondary | ICD-10-CM

## 2021-07-15 DIAGNOSIS — K3189 Other diseases of stomach and duodenum: Secondary | ICD-10-CM

## 2021-07-15 DIAGNOSIS — K7460 Unspecified cirrhosis of liver: Secondary | ICD-10-CM

## 2021-07-15 DIAGNOSIS — K529 Noninfective gastroenteritis and colitis, unspecified: Principal | ICD-10-CM

## 2021-07-15 DIAGNOSIS — C220 Liver cell carcinoma: Secondary | ICD-10-CM | POA: Diagnosis not present

## 2021-07-15 DIAGNOSIS — I8510 Secondary esophageal varices without bleeding: Secondary | ICD-10-CM

## 2021-07-15 DIAGNOSIS — R531 Weakness: Secondary | ICD-10-CM | POA: Diagnosis not present

## 2021-07-15 LAB — URINALYSIS, ROUTINE W REFLEX MICROSCOPIC
Bilirubin Urine: NEGATIVE
Glucose, UA: NEGATIVE mg/dL
Hgb urine dipstick: NEGATIVE
Ketones, ur: NEGATIVE mg/dL
Nitrite: POSITIVE — AB
Protein, ur: 30 mg/dL — AB
Specific Gravity, Urine: 1.027 (ref 1.005–1.030)
WBC, UA: >50 WBC/hpf — ABNORMAL HIGH (ref 0–5)
pH: 5 (ref 5.0–8.0)

## 2021-07-15 LAB — CBC
HCT: 25.8 % — ABNORMAL LOW (ref 39.0–52.0)
Hemoglobin: 8.7 g/dL — ABNORMAL LOW (ref 13.0–17.0)
MCH: 35.2 pg — ABNORMAL HIGH (ref 26.0–34.0)
MCHC: 33.7 g/dL (ref 30.0–36.0)
MCV: 104.5 fL — ABNORMAL HIGH (ref 80.0–100.0)
Platelets: 62 10*3/uL — ABNORMAL LOW (ref 150–400)
RBC: 2.47 MIL/uL — ABNORMAL LOW (ref 4.22–5.81)
RDW: 17.7 % — ABNORMAL HIGH (ref 11.5–15.5)
WBC: 2.9 10*3/uL — ABNORMAL LOW (ref 4.0–10.5)
nRBC: 0 % (ref 0.0–0.2)

## 2021-07-15 LAB — GLUCOSE, CAPILLARY
Glucose-Capillary: 131 mg/dL — ABNORMAL HIGH (ref 70–99)
Glucose-Capillary: 169 mg/dL — ABNORMAL HIGH (ref 70–99)
Glucose-Capillary: 227 mg/dL — ABNORMAL HIGH (ref 70–99)
Glucose-Capillary: 131 mg/dL — ABNORMAL HIGH (ref 70–99)

## 2021-07-15 MED ORDER — SODIUM CHLORIDE 0.9 % IV SOLN
2.0000 g | INTRAVENOUS | Status: DC
  Administered 2021-07-15 – 2021-07-17 (×3): 2 g via INTRAVENOUS
  Filled 2021-07-15 (×3): qty 20

## 2021-07-15 MED ORDER — SODIUM CHLORIDE 0.9 % IV SOLN
500.0000 mg | INTRAVENOUS | Status: DC
  Administered 2021-07-15: 500 mg via INTRAVENOUS
  Filled 2021-07-15 (×2): qty 500

## 2021-07-15 MED ORDER — LOPERAMIDE HCL 2 MG PO CAPS: 2.0000 mg | ORAL_CAPSULE | Freq: Three times a day (TID) | ORAL | Status: DC | PRN

## 2021-07-15 NOTE — Telephone Encounter (Signed)
Yes thanks but I will need her to make a copy of the FMLA paperwork-bring [place] the original copy and with the extra copy I want her to fill it out to the best of her ability along with her HR including all the dates she has missed in any she anticipates missing.  If she could write a letter on her current role in supporting her father and what he requires that would be helpful as well as I fill out the paperwork.

## 2021-07-15 NOTE — Progress Notes (Addendum)
PROGRESS NOTE    [name]  UYQ:[id number] DOB: 1941/06/02 DOA: 07/12/2021 PCP: [name], MD   Brief Narrative: 80 year old with past medical history significant for thrombocytopenia, type 2 diabetes, anemia, B12 deficiency, hepatocellular carcinoma treated with SBRT  who presents to the emergency room  with 1 month history of watery diarrhea. Recent follow-up with GI as an outpatient.  Recent C. difficile and GI pathogen negative.  [name] presented with generalized weakness. Underwent endoscopy and colonoscopy.  Awaiting colonoscopy results.   Assessment & Plan:   Principal Problem:   Generalized weakness Active Problems:   DM (diabetes mellitus) type II controlled, neurological manifestation (HCC)   Essential hypertension   COPD (chronic obstructive pulmonary disease) (HCC)   CKD (chronic kidney disease), stage III (HCC)   Hepatocellular carcinoma (HCC)   Chronic diarrhea   Diarrhea due to malabsorption   Cirrhosis of liver without ascites (HCC)   Mucosal abnormality of stomach   Portal hypertensive gastropathy (HCC)   Secondary esophageal varices without bleeding (HCC)   Malnutrition of moderate degree   1-Generalized weakness: -Related to poor oral intake and acute illness.  -PT   2-Fever;  Chest x ray with possible PNA.  UA with more than 50 WBC>  Plan to start IV ceftriaxone and azithromycin.   Chronic Diarrhea: -CT abdomen: Known hepatic lesion appears stable from recent MRI.  Slightly increased degrees of ascites when compared to prior study. C. difficile negative out patient.  Underwent endoscopy and colonoscopy: Endoscopy has a small flat lesion in the stomach, biopsies obtained.  Colonoscopy diverticulosis, biopsy obtained to rule out microscopic colitis GI recommended to schedule Imodium.   Hepatocellular carcinoma: Chronic  Diabetes type 2: Stable  Hypertension: On carvedilol.   COPD: Stable  CKD stage IIIa  Stable  History of systolic  heart failure, ejection fraction 11/2019 25%, repeated echo 05/2020, EF 55 % On entresto, hold while having diarrhea.  Holding entresto  Hypomagnesemia; replaced.  Hyponatremia; resolved with IV fluids.     Nutrition Problem: Moderate Malnutrition Etiology: chronic illness (COPD, ILD)    Signs/Symptoms: energy intake < or equal to 75% for > or equal to 1 month, mild fat depletion, moderate fat depletion, mild muscle depletion, moderate muscle depletion    Interventions: Boost Breeze, MVI, Prostat  Estimated body mass index is 29.06 kg/m as calculated from the following:   Height as of this encounter: 5\' 11"  (1.803 m).   Weight as of this encounter: 94.5 kg.   DVT prophylaxis: SCD Code Status: Full Code Family Communication: care discussed with patient.  Disposition Plan:  Status is: Inpatient  Remains inpatient appropriate because:Inpatient level of care appropriate due to severity of illness  Dispo: The patient is from: Home              Anticipated d/c is to: Home              Patient currently is not medically stable to d/c.   Difficult to place patient No        Consultants:  GI  Procedures:  Endoscopy Colonoscopy   Antimicrobials:    Subjective: [name] is not feeling well, [name] is tires weak. No diarrhea since colonoscopy. [name] is eating. Report mild cough.   Objective: Vitals:   07/15/21 0344 07/15/21 0822 07/15/21 1208 07/15/21 1613  BP: (!) 103/54 (!) 109/56 (!) 115/57 123/62  Pulse: 80 (!) 109 96 (!) 109  Resp: 17 16 16 16   Temp: 97.7 F (36.5 C) 100 F (  37.8 C) 99.5 F (37.5 C) 98.9 F (37.2 C)  TempSrc: Oral Oral Oral Oral  SpO2:  92% 91% 92%  Weight: 94.5 kg     Height:        Intake/Output Summary (Last 24 hours) at 07/15/2021 1723 Last data filed at 07/14/2021 2030 Gross per 24 hour  Intake 240 ml  Output --  Net 240 ml    Filed Weights   07/12/21 1312 07/14/21 1005 07/15/21 0344  Weight: 89.4 kg 89.4 kg 94.5 kg     Examination:  General exam: NAD Respiratory system: CTA Cardiovascular system: S 1, S 2 RRR Gastrointestinal system: BS present, soft, nt Central nervous system: Alert Extremities: Symmetric power    Data Reviewed: I have personally reviewed following labs and imaging studies  CBC: Recent Labs  Lab 07/12/21 1311 07/13/21 0318 07/15/21 1028  WBC 5.3 3.9* 2.9*  NEUTROABS 4.2 2.6  --   HGB 11.0* 9.7* 8.7*  HCT 32.8* 28.4* 25.8*  MCV 103.8* 102.9* 104.5*  PLT 97* 81* 62*    Basic Metabolic Panel: Recent Labs  Lab 07/12/21 1311 07/13/21 0318 07/14/21 1337  NA 133* 133* 137  K 4.4 4.2 4.0  CL 104 104 106  CO2 21* 22 24  GLUCOSE 121* 94 147*  BUN 10 11 12   CREATININE 0.87 0.86 0.87  CALCIUM 8.8* 8.4* 8.6*  MG 1.3* 1.9  --     GFR: Estimated Creatinine Clearance: 79.5 mL/min (by C-G formula based on SCr of 0.87 mg/dL). Liver Function Tests: Recent Labs  Lab 07/12/21 1311 07/13/21 0318  AST 30 27  ALT 16 13  ALKPHOS 82 82  BILITOT 1.0 1.1  PROT 6.0* 5.3*  ALBUMIN 3.1* 2.7*    Recent Labs  Lab 07/12/21 1311  LIPASE 48    No results for input(s): AMMONIA in the last 168 hours. Coagulation Profile: No results for input(s): INR, PROTIME in the last 168 hours. Cardiac Enzymes: No results for input(s): CKTOTAL, CKMB, CKMBINDEX, TROPONINI in the last 168 hours. BNP (last 3 results) No results for input(s): PROBNP in the last 8760 hours. HbA1C: Recent Labs    07/13/21 0318  HGBA1C 5.2    CBG: Recent Labs  Lab 07/14/21 1715 07/14/21 2011 07/15/21 0825 07/15/21 1207 07/15/21 1611  GLUCAP 138* 96 131* 169* 227*    Lipid Profile: No results for input(s): CHOL, HDL, LDLCALC, TRIG, CHOLHDL, LDLDIRECT in the last 72 hours. Thyroid Function Tests: No results for input(s): TSH, T4TOTAL, FREET4, T3FREE, THYROIDAB in the last 72 hours. Anemia Panel: No results for input(s): VITAMINB12, FOLATE, FERRITIN, TIBC, IRON, RETICCTPCT in the last 72  hours. Sepsis Labs: No results for input(s): PROCALCITON, LATICACIDVEN in the last 168 hours.  Recent Results (from the past 240 hour(s))  Resp Panel by RT-PCR (Flu A&B, Covid) Nasopharyngeal Swab     Status: None   Collection Time: 07/12/21 10:18 PM   Specimen: Nasopharyngeal Swab; Nasopharyngeal(NP) swabs in vial transport medium  Result Value Ref Range Status   SARS Coronavirus 2 by RT PCR NEGATIVE NEGATIVE Final    Comment: (NOTE) SARS-CoV-2 target nucleic acids are NOT DETECTED.  The SARS-CoV-2 RNA is generally detectable in upper respiratory specimens during the acute phase of infection. The lowest concentration of SARS-CoV-2 viral copies this assay can detect is 138 copies/mL. A negative result does not preclude SARS-Cov-2 infection and should not be used as the sole basis for treatment or other patient management decisions. A negative result may occur with  improper specimen  collection/handling, submission of specimen other than nasopharyngeal swab, presence of viral mutation(s) within the areas targeted by this assay, and inadequate number of viral copies(<138 copies/mL). A negative result must be combined with clinical observations, patient history, and epidemiological information. The expected result is Negative.  Fact Sheet for Patients:  EntrepreneurPulse.com.au  Fact Sheet for Healthcare Providers:  IncredibleEmployment.be  This test is no t yet approved or cleared by the [place] FDA and  has been authorized for detection and/or diagnosis of SARS-CoV-2 by FDA under an Emergency Use Authorization (EUA). This EUA will remain  in effect (meaning this test can be used) for the duration of the COVID-19 declaration under Section 564(b)(1) of the Act, 21 U.S.C.section 360bbb-3(b)(1), unless the authorization is terminated  or revoked sooner.       Influenza A by PCR NEGATIVE NEGATIVE Final   Influenza B by PCR NEGATIVE  NEGATIVE Final    Comment: (NOTE) The Xpert Xpress SARS-CoV-2/FLU/RSV plus assay is intended as an aid in the diagnosis of influenza from Nasopharyngeal swab specimens and should not be used as a sole basis for treatment. Nasal washings and aspirates are unacceptable for Xpert Xpress SARS-CoV-2/FLU/RSV testing.  Fact Sheet for Patients: EntrepreneurPulse.com.au  Fact Sheet for Healthcare Providers: IncredibleEmployment.be  This test is not yet approved or cleared by the [place] FDA and has been authorized for detection and/or diagnosis of SARS-CoV-2 by FDA under an Emergency Use Authorization (EUA). This EUA will remain in effect (meaning this test can be used) for the duration of the COVID-19 declaration under Section 564(b)(1) of the Act, 21 U.S.C. section 360bbb-3(b)(1), unless the authorization is terminated or revoked.  Performed at [facility], [place] [address] [place], [place] [address]           Radiology Studies: DG Chest 2 View  Result Date: 07/15/2021 CLINICAL DATA:  Fever for a few days, diarrhea for 3 weeks EXAM: CHEST - 2 VIEW COMPARISON:  Chest radiograph 01/22/2020 FINDINGS: The cardiomediastinal silhouette is stable, with unchanged calcified atherosclerotic plaque of the aortic arch. There are patchy opacities projecting over the left mid lung on the frontal projection and over the left lower lobe on the lateral projection, not seen on the prior study. There is no pleural effusion or pneumothorax. A small hiatal hernia is again seen. The bones are unremarkable. IMPRESSION: Patchy opacities in the left lower lobe suspicious for pneumonia in the correct clinical setting. Recommend follow-up radiographs in 6-8 weeks to assess for resolution. Electronically Signed   By: Valetta Mole M.D.   On: 07/15/2021 13:13        Scheduled Meds:  (feeding supplement) PROSource Plus  30 mL Oral TID BM   acyclovir  400 mg Oral  BID   aspirin EC  81 mg Oral Daily   carvedilol  6.25 mg Oral BID WC   escitalopram  5 mg Oral Daily   feeding supplement  1 Container Oral TID BM   finasteride  5 mg Oral Daily   gabapentin  300 mg Oral BID   insulin aspart  0-5 Units Subcutaneous QHS   insulin aspart  0-9 Units Subcutaneous TID WC   multivitamin with minerals  1 tablet Oral Daily   tamsulosin  0.4 mg Oral Daily   Continuous Infusions:  azithromycin     cefTRIAXone (ROCEPHIN)  IV       LOS: 2 days    Time spent: 35 Minutes    Wynn Alldredge Desiree Lucy, MD [facility]  If 7PM-7AM, please contact night-coverage [email or web address]  07/15/2021, 5:23 PM

## 2021-07-15 NOTE — Progress Notes (Signed)
[facility] ROUNDING NOTE   Subjective: No acute events overnight.  Pt states he has not had a bowel movement since his EGD/colo yesterday.  He reports his appetite is much improved.   Low grade fever last night and a 1 point drop in Hgb, no e/o GI blood loss.  No high risk interventions other than biopsies performed on endoscopic procedures yesterday.   Objective: Vital signs in last 24 hours: Temp:  [97.6 F (36.4 C)-100 F (37.8 C)] 100 F (37.8 C) (10/06 0822) Pulse Rate:  [69-109] 109 (10/06 0822) Resp:  [13-18] 16 (10/06 0822) BP: (103-126)/(47-68) 109/56 (10/06 0822) SpO2:  [92 %-99 %] 92 % (10/06 0822) Weight:  [94.5 kg] 94.5 kg (10/06 0344) Last BM Date: 07/14/21 General: NAD, Elderly, frail Caucasian male,  Lungs:  CTA b/l, no w/r/r Heart:  RRR, systolic murmur Abdomen:  Soft, NT, ND, +BS Ext:  No c/c/e    Intake/Output from previous day: 10/05 0701 - 10/06 0700 In: 580 [P.O.:480; I.V.:100] Out: -  Intake/Output this shift: No intake/output data recorded.   Lab Results: Recent Labs    07/12/21 1311 07/13/21 0318 07/15/21 1028  WBC 5.3 3.9* 2.9*  HGB 11.0* 9.7* 8.7*  PLT 97* 81* 62*  MCV 103.8* 102.9* 104.5*   BMET Recent Labs    07/12/21 1311 07/13/21 0318 07/14/21 1337  NA 133* 133* 137  K 4.4 4.2 4.0  CL 104 104 106  CO2 21* 22 24  GLUCOSE 121* 94 147*  BUN 10 11 12   CREATININE 0.87 0.86 0.87  CALCIUM 8.8* 8.4* 8.6*   LFT Recent Labs    07/12/21 1311 07/13/21 0318  PROT 6.0* 5.3*  ALBUMIN 3.1* 2.7*  AST 30 27  ALT 16 13  ALKPHOS 82 82  BILITOT 1.0 1.1   PT/INR No results for input(s): INR in the last 72 hours.    Imaging/Other results: No results found.    Assessment and Plan:  80 year old male with history of CHF, cirrhosis with HCC s/p SBRT, DM and a history of chronic intermittent diarrhea for many years, but with 4 weeks of constant diarrhea resulting in profound fatigue for the patient, prompting  admission for hydration and expedited evaluation.  Reassuringly, his labs including electrolytes, renal function and CBC were not significantly abnormal.  Stool studies negative for infectious cause and CT showed no bowel wall thickening.  No history of flushing or bronchospasm.  History of chronic watery diarrhea with urgency/incontinence without abdominal pain/n/v suggestive of microscopic colitis.    EGD on Oct 4th with small esophageal varices, portal hypertensive gastropathy and an atypical appearing flat lesion in the stomach with raised borders, biopsied Colonoscopy with severe diverticulosis, otherwise unremarkable, biopsies pending.   Diarrhea, chronic, severe, secretory, unclear etiology - Much improved since colonoscopy, unclear if this is all secondary to loperamide - Continue loperamide 2 mg TID for now - Await biopsies to assess for microscopic colitis - Can be discharged home from GI standpoint   Cirrhosis - Small esophageal varices, no need for banding or NSBB - Portal hypertensive gastropathy, could explain anemia - Low sodium diet   Atypical appearing gastric lesion - Await biopsies  [place] - Patient to have MRI surveillance in January  Daryel November, MD  07/15/2021, 12:03 PM [facility]

## 2021-07-15 NOTE — Telephone Encounter (Signed)
Is this ok?

## 2021-07-15 NOTE — TOC Initial Note (Addendum)
Transition of Care [facility]) - Initial/Assessment Note    Patient Details  Name: [name] MRN: [id number] Date of Birth: 11-26-1940  Transition of Care [facility]) CM/SW Contact:    Verdell Carmine, RN Phone Number: 07/15/2021, 8:02 AM  Clinical Narrative:                  80 year old patient admitted for malnutrition.weakness secondary to loose stools and weakness History of heaptocellular carcinoma COPD. EGD znd Colonoscopy done , found esophageal varicies biopsied area, and portal hypertension. Patient evaluated by PT and OT, refusing HH to them. Has RW at home. Called patient in room due to remote work to discuss Home health and needs. Patient states he changed his mind and would like someone to come for therapy. He would like someone to work best with his insurance, other than that has no preference. Called Stacy from [facility] for acceptance Denies any other need for DME, Has RW at home.  0930 Accepted for PT from [facility] Barriers to Discharge: No Barriers Identified   Patient Goals and CMS Choice        Expected Discharge Plan and [place] with home health   Living arrangements for the past 2 months: [place]                                      Prior Living Arrangements/Services Living arrangements for the past 2 months: Single Family Home Lives with:: Spouse Patient language and need for interpreter reviewed:: Yes        Need for Family Participation in Patient Care: Yes (Comment) Care giver support system in place?: Yes (comment) Current home services: DME (has RW) Criminal Activity/Legal Involvement Pertinent to Current Situation/Hospitalization: No - Comment as needed  Activities of Daily Living Home Assistive Devices/Equipment: Cane (specify quad or straight) ADL Screening (condition at time of admission) Patient's cognitive ability adequate to safely complete daily activities?: Yes Is the patient deaf or have difficulty  hearing?: No Does the patient have difficulty seeing, even when wearing glasses/contacts?: No Does the patient have difficulty concentrating, remembering, or making decisions?: No Patient able to express need for assistance with ADLs?: Yes Does the patient have difficulty dressing or bathing?: No Independently performs ADLs?: Yes (appropriate for developmental age) Does the patient have difficulty walking or climbing stairs?: No Weakness of Legs: Left Weakness of Arms/Hands: None  Permission Sought/Granted                  Emotional Assessment       Orientation: : Oriented to Self, Oriented to Place, Oriented to  Time Alcohol / Substance Use: Not Applicable Psych Involvement: No (comment)  Admission diagnosis:  Dehydration [E86.0] Generalized weakness [R53.1] Diarrhea due to malabsorption [K90.9, R19.7] Diarrhea, unspecified type [R19.7] Patient Active Problem List   Diagnosis Date Noted   Malnutrition of moderate degree 07/14/2021   Cirrhosis of liver without ascites (HCC)    Mucosal abnormality of stomach    Portal hypertensive gastropathy ([place])    Secondary esophageal varices without bleeding ([place])    Generalized weakness 07/13/2021   Chronic diarrhea 07/13/2021   Diarrhea due to malabsorption 07/13/2021   Iron deficiency anemia due to chronic blood loss 04/19/2021   Hepatocellular carcinoma ([place]) 06/01/2020   Anxiety 47/82/9562   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[place]) 12/14/2019   Aortic atherosclerosis ([place])  08/06/2535   Chronic systolic heart failure ([place]) 12/05/2019   Anemia 03/05/2018   GERD (gastroesophageal reflux disease) 09/30/2017   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 positive 03/29/2016   Thrombocytopenia ([place]) 12/21/2015   B12 deficiency 08/20/2015   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   Diabetic polyneuropathy associated with type 2 diabetes mellitus ([place]) 08/07/2015   Diplopia 06/05/2015   CKD (chronic kidney disease), stage III ([place]) 05/06/2015   Fatty  liver 11/04/2014   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 Off narcotics 06/05/17 due to negative UDS for opiates x2. Full history 06/12/14. Pain contract signed.  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[place]) 04/06/2007   Hyperlipidemia associated with type 2 diabetes mellitus ([place]) 04/06/2007   Essential hypertension 04/06/2007   Osteoarthritis 04/06/2007   PCP:  [name], MD Pharmacy:   [facility] - [address] [place] [address] Phone: [phone] Fax: [phone]  [place], [place] [address] [place] [address] Phone: [phone] Fax: [phone]  [facility] [address], [place] [address] [place] [address] Phone: [phone] Fax: [phone]     Social Determinants of Health (SDOH) Interventions    Readmission Risk Interventions No flowsheet data found.

## 2021-07-15 NOTE — Telephone Encounter (Signed)
Pt's daughter, [name] called wanting to know if Dr Yong Channel can fill out FMLA papers for her. [name] stated that [name] has been going back and forth to the hospital. [name] stated that she is having to take care of [name]. [name] is not a patient with [place]. Can Dr Yong Channel fill out FMLA. Please Advise.

## 2021-07-16 DIAGNOSIS — R531 Weakness: Secondary | ICD-10-CM | POA: Diagnosis not present

## 2021-07-16 LAB — BASIC METABOLIC PANEL
Anion gap: 4 — ABNORMAL LOW (ref 5–15)
BUN: 18 mg/dL (ref 8–23)
CO2: 25 mmol/L (ref 22–32)
Calcium: 8.4 mg/dL — ABNORMAL LOW (ref 8.9–10.3)
Chloride: 105 mmol/L (ref 98–111)
Creatinine, Ser: 1.01 mg/dL (ref 0.61–1.24)
GFR, Estimated: >60 mL/min (ref >60)
Glucose, Bld: 146 mg/dL — ABNORMAL HIGH (ref 70–99)
Potassium: 4.4 mmol/L (ref 3.5–5.1)
Sodium: 134 mmol/L — ABNORMAL LOW (ref 135–145)

## 2021-07-16 LAB — CBC
HCT: 23.9 % — ABNORMAL LOW (ref 39.0–52.0)
Hemoglobin: 8 g/dL — ABNORMAL LOW (ref 13.0–17.0)
MCH: 35.1 pg — ABNORMAL HIGH (ref 26.0–34.0)
MCHC: 33.5 g/dL (ref 30.0–36.0)
MCV: 104.8 fL — ABNORMAL HIGH (ref 80.0–100.0)
Platelets: 56 10*3/uL — ABNORMAL LOW (ref 150–400)
RBC: 2.28 MIL/uL — ABNORMAL LOW (ref 4.22–5.81)
RDW: 17.2 % — ABNORMAL HIGH (ref 11.5–15.5)
WBC: 2.6 10*3/uL — ABNORMAL LOW (ref 4.0–10.5)
nRBC: 0 % (ref 0.0–0.2)

## 2021-07-16 LAB — GLUCOSE, CAPILLARY
Glucose-Capillary: 125 mg/dL — ABNORMAL HIGH (ref 70–99)
Glucose-Capillary: 160 mg/dL — ABNORMAL HIGH (ref 70–99)
Glucose-Capillary: 125 mg/dL — ABNORMAL HIGH (ref 70–99)
Glucose-Capillary: 159 mg/dL — ABNORMAL HIGH (ref 70–99)

## 2021-07-16 LAB — MRSA NEXT GEN BY PCR, NASAL: MRSA by PCR Next Gen: NOT DETECTED

## 2021-07-16 MED ORDER — AZITHROMYCIN 500 MG PO TABS
500.0000 mg | ORAL_TABLET | ORAL | Status: DC
  Administered 2021-07-16 – 2021-07-17 (×2): 500 mg via ORAL
  Filled 2021-07-16 (×2): qty 1

## 2021-07-16 NOTE — Progress Notes (Signed)
Occupational Therapy Treatment Patient Details Name: [name] MRN: [id number] DOB: 1940/10/12 Today's Date: 07/16/2021   History of present illness Pt is an 80 y/o male admitted 10/3 secondary to diarrhea and weakness. PMH includes hepatocellular carcinoma, DM, CKD, COPD, and HTN.  10/7: Fever 102 and Chest x ray with possible PNA.   OT comments  Patient with continued progress toward patient focused goals.  Patient with possible new diagnosis of UTI and PNA.  Of note, increased shortness of breath and O2 sats 87% on RA, did rebound to 93% with rest and pursed lip breathing.  Patient is essentially supervision for basic mobility at [facility] level and for stand grooming and lower body ADL seated.  Patient is looking to discharge home, home health services may be needed, but as of now, no post acute Ot is indicated.  Hopefully staff will continue to encourage up out of bed, mobility in room/halls, and participation with self care sink side.     Recommendations for follow up therapy are one component of a multi-disciplinary discharge planning process, led by the attending physician.  Recommendations may be updated based on patient status, additional functional criteria and insurance authorization.    Follow Up Recommendations  No OT follow up    Equipment Recommendations  None recommended by OT    Recommendations for Other Services      Precautions / Restrictions Precautions Precautions: Fall Precaution Comments: watch O2 sats Restrictions Weight Bearing Restrictions: No       Mobility Bed Mobility Overal bed mobility: Needs Assistance Bed Mobility: Supine to Sit;Sit to Supine     Supine to sit: Modified independent (Device/Increase time) Sit to supine: Modified independent (Device/Increase time)     Patient Response: Cooperative  Transfers Overall transfer level: Needs assistance Equipment used: Straight cane Transfers: Sit to/from Stand Sit to Stand: Supervision               Balance Overall balance assessment: Needs assistance Sitting-balance support: No upper extremity supported;Feet supported Sitting balance-Leahy Scale: Good     Standing balance support: Single extremity supported Standing balance-Leahy Scale: Fair Standing balance comment: reliant on UE support of assistive device                           ADL either performed or assessed with clinical judgement   ADL       Grooming: Wash/dry hands;Wash/dry face;Set up;Standing               Lower Body Dressing: Min guard;Sit to/from stand Lower Body Dressing Details (indicate cue type and reason): able to change socks Toilet Transfer: Ambulation;Regular Toilet;Supervision/safety Toilet Transfer Details (indicate cue type and reason): SPC in room Toileting- Clothing Manipulation and Hygiene: Sitting/lateral lean;Modified independent       Functional mobility during ADLs: Cane;Supervision/safety                         Cognition Arousal/Alertness: Awake/alert Behavior During Therapy: WFL for tasks assessed/performed Overall Cognitive Status: Within Functional Limits for tasks assessed                                                         Frequency  Min 2X/week        Progress Toward  Goals  OT Goals(current goals can now be found in the care plan section)  Progress towards OT goals: Progressing toward goals  Acute Rehab OT Goals Patient Stated Goal: to go home OT Goal Formulation: With patient Time For Goal Achievement: 07/28/21 Potential to Achieve Goals: Good  Plan Discharge plan remains appropriate    Co-evaluation                 AM-PAC OT "6 Clicks" Daily Activity     Outcome Measure   Help from another person eating meals?: None Help from another person taking care of personal grooming?: None Help from another person toileting, which includes using toliet, bedpan, or urinal?: A Little Help from  another person bathing (including washing, rinsing, drying)?: A Little Help from another person to put on and taking off regular upper body clothing?: None Help from another person to put on and taking off regular lower body clothing?: A Little 6 Click Score: 21    End of Session Equipment Utilized During Treatment: Other (comment)  OT Visit Diagnosis: Unsteadiness on feet (R26.81)   Activity Tolerance Patient limited by fatigue   Patient Left in bed;with call bell/phone within reach   Nurse Communication          Time: 0347-4259 OT Time Calculation (min): 15 min  Charges: OT General Charges $OT Visit: 1 Visit OT Treatments $Self Care/Home Management : 8-22 mins  07/16/2021  RP, OTR/L  Acute Rehabilitation Services  Office:  [phone]   Metta Clines 07/16/2021, 11:58 AM

## 2021-07-16 NOTE — Progress Notes (Signed)
Physical Therapy Treatment Patient Details Name: [name] MRN: [id number] DOB: 11/28/1940 Today's Date: 07/16/2021   History of Present Illness Pt is an 80 y/o male admitted 10/3 secondary to diarrhea and weakness. PMH includes hepatocellular carcinoma, DM, CKD, COPD, and HTN.  10/7: Fever 102 and Chest x ray with possible PNA.    PT Comments    Pt was fatigued from earlier session with OT in which his O2 sats dropped with PNA diagnosed as well.  Pt is willing to do bed ex, and encouraged him to get OOB to chair with nursing later today.  Follow up with him to increase standing endurance and gait stability, and closely monitor sats during all activity as tolerated.  Focus on goals of acute PT and continue to recommend maximizing in house therapy due to his refusal of follow up care.   Recommendations for follow up therapy are one component of a multi-disciplinary discharge planning process, led by the attending physician.  Recommendations may be updated based on patient status, additional functional criteria and insurance authorization.  Follow Up Recommendations  Other (comment) (will refuse follow up therapy)     Equipment Recommendations  None recommended by PT    Recommendations for Other Services       Precautions / Restrictions Precautions Precautions: Fall Precaution Comments: watch O2 sats Restrictions Weight Bearing Restrictions: No     Mobility  Bed Mobility Overal bed mobility: Needs Assistance             General bed mobility comments: remained in bed and self repositioned    Transfers                 General transfer comment: declined to try  Ambulation/Gait                 Stairs             Wheelchair Mobility    Modified Rankin (Stroke Patients Only)       Balance                                            Cognition Arousal/Alertness: Awake/alert Behavior During Therapy: WFL for tasks  assessed/performed Overall Cognitive Status: Within Functional Limits for tasks assessed                                        Exercises General Exercises - Lower Extremity Ankle Circles/Pumps: AAROM;AROM;5 reps Quad Sets: AROM;10 reps Gluteal Sets: AROM;10 reps Heel Slides: AROM;AAROM;10 reps Hip ABduction/ADduction: AROM;AAROM;10 reps Straight Leg Raises: AROM;AAROM;10 reps Hip Flexion/Marching: AAROM;AROM;10 reps    General Comments        Pertinent Vitals/Pain Pain Assessment: No/denies pain    Home Living                      Prior Function            PT Goals (current goals can now be found in the care plan section) Acute Rehab PT Goals Patient Stated Goal: to go home Progress towards PT goals: Not progressing toward goals - comment    Frequency    Min 3X/week      PT Plan Current plan remains appropriate    Co-evaluation  AM-PAC PT "6 Clicks" Mobility   Outcome Measure  Help needed turning from your back to your side while in a flat bed without using bedrails?: None Help needed moving from lying on your back to sitting on the side of a flat bed without using bedrails?: None Help needed moving to and from a bed to a chair (including a wheelchair)?: A Little Help needed standing up from a chair using your arms (e.g., wheelchair or bedside chair)?: A Little Help needed to walk in hospital room?: A Little Help needed climbing 3-5 steps with a railing? : A Little 6 Click Score: 20    End of Session   Activity Tolerance: Patient tolerated treatment well Patient left: in bed;with call bell/phone within reach Nurse Communication: Mobility status PT Visit Diagnosis: Unsteadiness on feet (R26.81);Muscle weakness (generalized) (M62.81)     Time: 2258-3462 PT Time Calculation (min) (ACUTE ONLY): 15 min  Charges:  $Therapeutic Exercise: 8-22 mins         Ramond Dial 07/16/2021, 4:45 PM  Mee Hives, PT  PhD Acute Rehab Dept. Number: [place] [facility] [place]

## 2021-07-16 NOTE — Care Management Important Message (Signed)
Important Message  Patient Details  Name: [name] MRN: [id number] Date of Birth: 03/24/1941   Medicare Important Message Given:  Yes     Tahiry Spicer Montine Circle 07/16/2021, 3:51 PM

## 2021-07-16 NOTE — Telephone Encounter (Signed)
Called and spoke with [name] and below message given, [name] states she will bring the forms by one day next week.

## 2021-07-16 NOTE — Progress Notes (Signed)
PROGRESS NOTE    [name]  HCW:[id number] DOB: 22-Feb-1941 DOA: 07/12/2021 PCP: [name], MD   Brief Narrative: 80 year old with past medical history significant for thrombocytopenia, type 2 diabetes, anemia, B12 deficiency, hepatocellular carcinoma treated with SBRT  who presents to the emergency room  with 1 month history of watery diarrhea. Recent follow-up with GI as an outpatient.  Recent C. difficile and GI pathogen negative.  He presented with generalized weakness. Underwent endoscopy and colonoscopy.  Awaiting colonoscopy results.   Assessment & Plan:   Principal Problem:   Generalized weakness Active Problems:   DM (diabetes mellitus) type II controlled, neurological manifestation (HCC)   Essential hypertension   COPD (chronic obstructive pulmonary disease) (HCC)   CKD (chronic kidney disease), stage III (HCC)   Hepatocellular carcinoma (HCC)   Chronic diarrhea   Diarrhea due to malabsorption   Cirrhosis of liver without ascites (HCC)   Mucosal abnormality of stomach   Portal hypertensive gastropathy (HCC)   Secondary esophageal varices without bleeding (HCC)   Malnutrition of moderate degree   1-Generalized weakness: -Related to poor oral intake and acute illness.  -PT   2-Fever; PNA, UTI Chest x ray with possible PNA.  UA with more than 50 WBC>  Continue with IV ceftriaxone and azithromycin.  Spike fever this am. Monitor.   Chronic Diarrhea: -CT abdomen: Known hepatic lesion appears stable from recent MRI.  Slightly increased degrees of ascites when compared to prior study. C. difficile negative out patient.  Underwent endoscopy and colonoscopy: Endoscopy has a small flat lesion in the stomach, biopsies obtained.  Colonoscopy diverticulosis, biopsy obtained to rule out microscopic colitis GI recommended to schedule Imodium.  Improved.   Hepatocellular carcinoma: Chronic  Diabetes type 2: Stable  Hypertension: On carvedilol.  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 Acute on chronic. Cirrhosis.   COPD: Stable  CKD stage IIIa  Stable  History of systolic heart failure, ejection fraction 11/2019 25%, repeated echo 05/2020, EF 55 % On entresto, hold while having diarrhea.  Holding entresto  Hypomagnesemia; replaced.  Hyponatremia; resolved with IV fluids.     Nutrition Problem: Moderate Malnutrition Etiology: chronic illness (COPD, ILD)    Signs/Symptoms: energy intake < or equal to 75% for > or equal to 1 month, mild fat depletion, moderate fat depletion, mild muscle depletion, moderate muscle depletion    Interventions: Boost Breeze, MVI, Prostat  Estimated body mass index is 28.41 kg/m as calculated from the following:   Height as of this encounter: 5\' 11"  (1.803 m).   Weight as of this encounter: 92.4 kg.   DVT prophylaxis: SCD Code Status: Full Code Family Communication: care discussed with patient.  Disposition Plan:  Status is: Inpatient  Remains inpatient appropriate because:Inpatient level of care appropriate due to severity of illness  Dispo: The patient is from: Home              Anticipated d/c is to: Home              Patient currently is not medically stable to d/c.   Difficult to place patient No        Consultants:  GI  Procedures:  Endoscopy Colonoscopy   Antimicrobials:    Subjective: He spike fever this am at 102. Diarrhea improved. Mild cough.   Objective: Vitals:   07/16/21 0516 07/16/21 0705 07/16/21 0806 07/16/21 1150  BP: (!) 100/43 (!) 118/59 105/79 (P) 138/71  Pulse: 89 84 95   Resp: 16 18 17  (P)  18  Temp: 99.7 F (37.6 C) 98.2 F (36.8 C) 98.4 F (36.9 C) (P) 98.5 F (36.9 C)  TempSrc: Oral Oral Oral (P) Oral  SpO2: 92% 94%    Weight:      Height:        Intake/Output Summary (Last 24 hours) at 07/16/2021 1413 Last data filed at 07/16/2021 0238 Gross per 24 hour  Intake 350 ml  Output --  Net 350 ml    Filed Weights   07/14/21 1005  07/15/21 0344 07/16/21 0500  Weight: 89.4 kg 94.5 kg 92.4 kg    Examination:  General exam: NAD Respiratory system: CTA Cardiovascular system: S 1, S 2 RRR Gastrointestinal system: BS present, soft, nt Central nervous system: Alert Extremities: Symmetric     Data Reviewed: I have personally reviewed following labs and imaging studies  CBC: Recent Labs  Lab 07/12/21 1311 07/13/21 0318 07/15/21 1028 07/16/21 0122  WBC 5.3 3.9* 2.9* 2.6*  NEUTROABS 4.2 2.6  --   --   HGB 11.0* 9.7* 8.7* 8.0*  HCT 32.8* 28.4* 25.8* 23.9*  MCV 103.8* 102.9* 104.5* 104.8*  PLT 97* 81* 62* 56*    Basic Metabolic Panel: Recent Labs  Lab 07/12/21 1311 07/13/21 0318 07/14/21 1337 07/16/21 0122  NA 133* 133* 137 134*  K 4.4 4.2 4.0 4.4  CL 104 104 106 105  CO2 21* 22 24 25   GLUCOSE 121* 94 147* 146*  BUN 10 11 12 18   CREATININE 0.87 0.86 0.87 1.01  CALCIUM 8.8* 8.4* 8.6* 8.4*  MG 1.3* 1.9  --   --     GFR: Estimated Creatinine Clearance: 67.7 mL/min (by C-G formula based on SCr of 1.01 mg/dL). Liver Function Tests: Recent Labs  Lab 07/12/21 1311 07/13/21 0318  AST 30 27  ALT 16 13  ALKPHOS 82 82  BILITOT 1.0 1.1  PROT 6.0* 5.3*  ALBUMIN 3.1* 2.7*    Recent Labs  Lab 07/12/21 1311  LIPASE 48    No results for input(s): AMMONIA in the last 168 hours. Coagulation Profile: No results for input(s): INR, PROTIME in the last 168 hours. Cardiac Enzymes: No results for input(s): CKTOTAL, CKMB, CKMBINDEX, TROPONINI in the last 168 hours. BNP (last 3 results) No results for input(s): PROBNP in the last 8760 hours. HbA1C: No results for input(s): HGBA1C in the last 72 hours.  CBG: Recent Labs  Lab 07/15/21 1207 07/15/21 1611 07/15/21 2104 07/16/21 0811 07/16/21 1155  GLUCAP 169* 227* 131* 125* 160*    Lipid Profile: No results for input(s): CHOL, HDL, LDLCALC, TRIG, CHOLHDL, LDLDIRECT in the last 72 hours. Thyroid Function Tests: No results for input(s): TSH,  T4TOTAL, FREET4, T3FREE, THYROIDAB in the last 72 hours. Anemia Panel: No results for input(s): VITAMINB12, FOLATE, FERRITIN, TIBC, IRON, RETICCTPCT in the last 72 hours. Sepsis Labs: No results for input(s): PROCALCITON, LATICACIDVEN in the last 168 hours.  Recent Results (from the past 240 hour(s))  Resp Panel by RT-PCR (Flu A&B, Covid) Nasopharyngeal Swab     Status: None   Collection Time: 07/12/21 10:18 PM   Specimen: Nasopharyngeal Swab; Nasopharyngeal(NP) swabs in vial transport medium  Result Value Ref Range Status   SARS Coronavirus 2 by RT PCR NEGATIVE NEGATIVE Final    Comment: (NOTE) SARS-CoV-2 target nucleic acids are NOT DETECTED.  The SARS-CoV-2 RNA is generally detectable in upper respiratory specimens during the acute phase of infection. The lowest concentration of SARS-CoV-2 viral copies this assay can detect is 138 copies/mL. A negative  result does not preclude SARS-Cov-2 infection and should not be used as the sole basis for treatment or other patient management decisions. A negative result may occur with  improper specimen collection/handling, submission of specimen other than nasopharyngeal swab, presence of viral mutation(s) within the areas targeted by this assay, and inadequate number of viral copies(<138 copies/mL). A negative result must be combined with clinical observations, patient history, and epidemiological information. The expected result is Negative.  Fact Sheet for Patients:  EntrepreneurPulse.com.au  Fact Sheet for Healthcare Providers:  IncredibleEmployment.be  This test is no t yet approved or cleared by the [place] FDA and  has been authorized for detection and/or diagnosis of SARS-CoV-2 by FDA under an Emergency Use Authorization (EUA). This EUA will remain  in effect (meaning this test can be used) for the duration of the COVID-19 declaration under Section 564(b)(1) of the Act, 21 U.S.C.section  360bbb-3(b)(1), unless the authorization is terminated  or revoked sooner.       Influenza A by PCR NEGATIVE NEGATIVE Final   Influenza B by PCR NEGATIVE NEGATIVE Final    Comment: (NOTE) The Xpert Xpress SARS-CoV-2/FLU/RSV plus assay is intended as an aid in the diagnosis of influenza from Nasopharyngeal swab specimens and should not be used as a sole basis for treatment. Nasal washings and aspirates are unacceptable for Xpert Xpress SARS-CoV-2/FLU/RSV testing.  Fact Sheet for Patients: EntrepreneurPulse.com.au  Fact Sheet for Healthcare Providers: IncredibleEmployment.be  This test is not yet approved or cleared by the [place] FDA and has been authorized for detection and/or diagnosis of SARS-CoV-2 by FDA under an Emergency Use Authorization (EUA). This EUA will remain in effect (meaning this test can be used) for the duration of the COVID-19 declaration under Section 564(b)(1) of the Act, 21 U.S.C. section 360bbb-3(b)(1), unless the authorization is terminated or revoked.  Performed at [facility], [place] [address], [place], [place] [address]           Radiology Studies: DG Chest 2 View  Result Date: 07/15/2021 CLINICAL DATA:  Fever for a few days, diarrhea for 3 weeks EXAM: CHEST - 2 VIEW COMPARISON:  Chest radiograph 01/22/2020 FINDINGS: The cardiomediastinal silhouette is stable, with unchanged calcified atherosclerotic plaque of the aortic arch. There are patchy opacities projecting over the left mid lung on the frontal projection and over the left lower lobe on the lateral projection, not seen on the prior study. There is no pleural effusion or pneumothorax. A small hiatal hernia is again seen. The bones are unremarkable. IMPRESSION: Patchy opacities in the left lower lobe suspicious for pneumonia in the correct clinical setting. Recommend follow-up radiographs in 6-8 weeks to assess for resolution. Electronically Signed    By: Valetta Mole M.D.   On: 07/15/2021 13:13        Scheduled Meds:  (feeding supplement) PROSource Plus  30 mL Oral TID BM   acyclovir  400 mg Oral BID   aspirin EC  81 mg Oral Daily   carvedilol  6.25 mg Oral BID WC   escitalopram  5 mg Oral Daily   feeding supplement  1 Container Oral TID BM   finasteride  5 mg Oral Daily   gabapentin  300 mg Oral BID   insulin aspart  0-5 Units Subcutaneous QHS   insulin aspart  0-9 Units Subcutaneous TID WC   multivitamin with minerals  1 tablet Oral Daily   tamsulosin  0.4 mg Oral Daily   Continuous Infusions:  azithromycin Stopped (07/15/21 1937)  cefTRIAXone (ROCEPHIN)  IV Stopped (07/15/21 1831)     LOS: 3 days    Time spent: Sour John, MD [facility]   If 7PM-7AM, please contact night-coverage [email or web address]  07/16/2021, 2:13 PM

## 2021-07-16 NOTE — Progress Notes (Signed)
   07/15/21 2300 07/16/21 0409  Assess: MEWS Score  Temp  --  (!) 102.2 F (39 C)  BP  --  (!) 105/47  Pulse Rate  --  100  ECG Heart Rate  --  100  Resp  --  16  Level of Consciousness  --  Alert  SpO2  --  91 %  Assess: MEWS Score  MEWS Temp  --  2  MEWS Systolic  --  0  MEWS Pulse  --  0  MEWS RR  --  0  MEWS LOC  --  0  MEWS Score  --  2  MEWS Score Color  --  Yellow  Assess: if the MEWS score is Yellow or Red  Were vital signs taken at a resting state?  --  Yes  Focused Assessment  --  No change from prior assessment  Early Detection of Sepsis Score *See Row Information*  --  High  MEWS guidelines implemented *See Row Information*  --  Yes  Treat  MEWS Interventions  --  Administered prn meds/treatments  Pain Scale  --  0-10  Pain Score  --  0  Take Vital Signs  Increase Vital Sign Frequency   --  Yellow: Q 2hr X 2 then Q 4hr X 2, if remains yellow, continue Q 4hrs  Escalate  MEWS: Escalate  --  Yellow: discuss with charge nurse/RN and consider discussing with provider and RRT  Notify: Charge Nurse/RN  Name of Charge Nurse/RN Notified  --  Wenda Overland, RN  Date Charge Nurse/RN Notified  --  07/16/21  Time Charge Nurse/RN Notified  --  [phone]  Document  Patient Outcome  --  Stabilized after interventions  Progress note created (see row info)  --  Yes

## 2021-07-17 DIAGNOSIS — R197 Diarrhea, unspecified: Secondary | ICD-10-CM | POA: Diagnosis not present

## 2021-07-17 DIAGNOSIS — C220 Liver cell carcinoma: Secondary | ICD-10-CM | POA: Diagnosis not present

## 2021-07-17 DIAGNOSIS — R531 Weakness: Secondary | ICD-10-CM | POA: Diagnosis not present

## 2021-07-17 LAB — BASIC METABOLIC PANEL
Anion gap: 5 (ref 5–15)
BUN: 18 mg/dL (ref 8–23)
CO2: 25 mmol/L (ref 22–32)
Calcium: 8.5 mg/dL — ABNORMAL LOW (ref 8.9–10.3)
Chloride: 105 mmol/L (ref 98–111)
Creatinine, Ser: 0.8 mg/dL (ref 0.61–1.24)
GFR, Estimated: >60 mL/min (ref >60)
Glucose, Bld: 102 mg/dL — ABNORMAL HIGH (ref 70–99)
Potassium: 4.4 mmol/L (ref 3.5–5.1)
Sodium: 135 mmol/L (ref 135–145)

## 2021-07-17 LAB — GLUCOSE, CAPILLARY
Glucose-Capillary: 106 mg/dL — ABNORMAL HIGH (ref 70–99)
Glucose-Capillary: 140 mg/dL — ABNORMAL HIGH (ref 70–99)
Glucose-Capillary: 141 mg/dL — ABNORMAL HIGH (ref 70–99)
Glucose-Capillary: 210 mg/dL — ABNORMAL HIGH (ref 70–99)

## 2021-07-17 LAB — CBC WITH DIFFERENTIAL/PLATELET
Abs Immature Granulocytes: 0.01 10*3/uL (ref 0.00–0.07)
Basophils Absolute: 0 10*3/uL (ref 0.0–0.1)
Basophils Relative: 0 %
Eosinophils Absolute: 0.1 10*3/uL (ref 0.0–0.5)
Eosinophils Relative: 3 %
HCT: 26.6 % — ABNORMAL LOW (ref 39.0–52.0)
Hemoglobin: 8.9 g/dL — ABNORMAL LOW (ref 13.0–17.0)
Immature Granulocytes: 0 %
Lymphocytes Relative: 15 %
Lymphs Abs: 0.3 10*3/uL — ABNORMAL LOW (ref 0.7–4.0)
MCH: 35.2 pg — ABNORMAL HIGH (ref 26.0–34.0)
MCHC: 33.5 g/dL (ref 30.0–36.0)
MCV: 105.1 fL — ABNORMAL HIGH (ref 80.0–100.0)
Monocytes Absolute: 0.2 10*3/uL (ref 0.1–1.0)
Monocytes Relative: 10 %
Neutro Abs: 1.6 10*3/uL — ABNORMAL LOW (ref 1.7–7.7)
Neutrophils Relative %: 72 %
Platelets: 54 10*3/uL — ABNORMAL LOW (ref 150–400)
RBC: 2.53 MIL/uL — ABNORMAL LOW (ref 4.22–5.81)
RDW: 17.2 % — ABNORMAL HIGH (ref 11.5–15.5)
WBC: 2.2 10*3/uL — ABNORMAL LOW (ref 4.0–10.5)
nRBC: 0 % (ref 0.0–0.2)

## 2021-07-17 LAB — CBC
HCT: 25.7 % — ABNORMAL LOW (ref 39.0–52.0)
Hemoglobin: 8.6 g/dL — ABNORMAL LOW (ref 13.0–17.0)
MCH: 35 pg — ABNORMAL HIGH (ref 26.0–34.0)
MCHC: 33.5 g/dL (ref 30.0–36.0)
MCV: 104.5 fL — ABNORMAL HIGH (ref 80.0–100.0)
Platelets: 56 10*3/uL — ABNORMAL LOW (ref 150–400)
RBC: 2.46 MIL/uL — ABNORMAL LOW (ref 4.22–5.81)
RDW: 17.2 % — ABNORMAL HIGH (ref 11.5–15.5)
WBC: 1.9 10*3/uL — ABNORMAL LOW (ref 4.0–10.5)
nRBC: 0 % (ref 0.0–0.2)

## 2021-07-17 NOTE — Progress Notes (Signed)
PROGRESS NOTE    [name]  KDX:[id number] DOB: 07-28-1941 DOA: 07/12/2021 PCP: [name], MD   Brief Narrative: 80 year old with past medical history significant for thrombocytopenia, type 2 diabetes, anemia, B12 deficiency, hepatocellular carcinoma treated with SBRT  who presents to the emergency room  with 1 month history of watery diarrhea. Recent follow-up with GI as an outpatient.  Recent C. difficile and GI pathogen negative.  He presented with generalized weakness. Underwent endoscopy and colonoscopy.  Awaiting colonoscopy results.   Assessment & Plan:   Principal Problem:   Generalized weakness Active Problems:   DM (diabetes mellitus) type II controlled, neurological manifestation (HCC)   Essential hypertension   COPD (chronic obstructive pulmonary disease) (HCC)   CKD (chronic kidney disease), stage III (HCC)   Hepatocellular carcinoma (HCC)   Chronic diarrhea   Diarrhea due to malabsorption   Cirrhosis of liver without ascites (HCC)   Mucosal abnormality of stomach   Portal hypertensive gastropathy (HCC)   Secondary esophageal varices without bleeding (HCC)   Malnutrition of moderate degree   1-Generalized weakness: -Related to poor oral intake and acute illness.  -PT   2-Fever; PNA, UTI Chest x ray with possible PNA.  UA with more than 50 WBC>  Continue with IV ceftriaxone and azithromycin.  Afebrile last 24 hours.   Pancytopenia; related to infection, monitor. Acute on chronic. Cirrhosis.  Worsening leukopenia. Dr [name] will see in consultation.   Chronic Diarrhea: -CT abdomen: Known hepatic lesion appears stable from recent MRI.  Slightly increased degrees of ascites when compared to prior study. C. difficile negative out patient.  Underwent endoscopy and colonoscopy: Endoscopy has a small flat lesion in the stomach, biopsies obtained.  Colonoscopy diverticulosis, biopsy obtained to rule out microscopic colitis GI recommended to schedule  Imodium.  Improved.   Hepatocellular carcinoma: Chronic  Diabetes type 2: Stable  Hypertension: On carvedilol.   COPD: Stable  CKD stage IIIa  Stable  History of systolic heart failure, ejection fraction 11/2019 25%, repeated echo 05/2020, EF 55 % On entresto, hold while having diarrhea.  Holding entresto  Hypomagnesemia; replaced.  Hyponatremia; resolved with IV fluids.     Nutrition Problem: Moderate Malnutrition Etiology: chronic illness (COPD, ILD)    Signs/Symptoms: energy intake < or equal to 75% for > or equal to 1 month, mild fat depletion, moderate fat depletion, mild muscle depletion, moderate muscle depletion    Interventions: Boost Breeze, MVI, Prostat  Estimated body mass index is 28.72 kg/m as calculated from the following:   Height as of this encounter: 5\' 11"  (1.803 m).   Weight as of this encounter: 93.4 kg.   DVT prophylaxis: SCD Code Status: Full Code Family Communication: care discussed with patient.  Disposition Plan:  Status is: Inpatient  Remains inpatient appropriate because:Inpatient level of care appropriate due to severity of illness  Dispo: The patient is from: Home              Anticipated d/c is to: Home              Patient currently is not medically stable to d/c.   Difficult to place patient No        Consultants:  GI  Procedures:  Endoscopy Colonoscopy   Antimicrobials:    Subjective: He is feeling well, no new complaint.  Objective: Vitals:   07/16/21 2328 07/17/21 0333 07/17/21 0344 07/17/21 0827  BP:  112/63  123/70  Pulse:  90  89  Resp:  16  18  Temp:  98.5 F (36.9 C)  98.1 F (36.7 C)  TempSrc:  Axillary  Oral  SpO2: 96% 96%  96%  Weight:   93.4 kg   Height:       No intake or output data in the 24 hours ending 07/17/21 1320  Filed Weights   07/15/21 0344 07/16/21 0500 07/17/21 0344  Weight: 94.5 kg 92.4 kg 93.4 kg    Examination:  General exam: NAD Respiratory system:  CTA Cardiovascular system: S 1, S 2 RRR Gastrointestinal system: BS present, soft, nt Central nervous system: alert, follows command Extremities: Symmetric     Data Reviewed: I have personally reviewed following labs and imaging studies  CBC: Recent Labs  Lab 07/12/21 1311 07/13/21 0318 07/15/21 1028 07/16/21 0122 07/17/21 0755 07/17/21 1103  WBC 5.3 3.9* 2.9* 2.6* 1.9* 2.2*  NEUTROABS 4.2 2.6  --   --   --  PENDING  HGB 11.0* 9.7* 8.7* 8.0* 8.6* 8.9*  HCT 32.8* 28.4* 25.8* 23.9* 25.7* 26.6*  MCV 103.8* 102.9* 104.5* 104.8* 104.5* 105.1*  PLT 97* 81* 62* 56* 56* 54*    Basic Metabolic Panel: Recent Labs  Lab 07/12/21 1311 07/13/21 0318 07/14/21 1337 07/16/21 0122 07/17/21 0755  NA 133* 133* 137 134* 135  K 4.4 4.2 4.0 4.4 4.4  CL 104 104 106 105 105  CO2 21* 22 24 25 25   GLUCOSE 121* 94 147* 146* 102*  BUN 10 11 12 18 18   CREATININE 0.87 0.86 0.87 1.01 0.80  CALCIUM 8.8* 8.4* 8.6* 8.4* 8.5*  MG 1.3* 1.9  --   --   --     GFR: Estimated Creatinine Clearance: 85.9 mL/min (by C-G formula based on SCr of 0.8 mg/dL). Liver Function Tests: Recent Labs  Lab 07/12/21 1311 07/13/21 0318  AST 30 27  ALT 16 13  ALKPHOS 82 82  BILITOT 1.0 1.1  PROT 6.0* 5.3*  ALBUMIN 3.1* 2.7*    Recent Labs  Lab 07/12/21 1311  LIPASE 48    No results for input(s): AMMONIA in the last 168 hours. Coagulation Profile: No results for input(s): INR, PROTIME in the last 168 hours. Cardiac Enzymes: No results for input(s): CKTOTAL, CKMB, CKMBINDEX, TROPONINI in the last 168 hours. BNP (last 3 results) No results for input(s): PROBNP in the last 8760 hours. HbA1C: No results for input(s): HGBA1C in the last 72 hours.  CBG: Recent Labs  Lab 07/16/21 1155 07/16/21 1648 07/16/21 1948 07/17/21 0733 07/17/21 1243  GLUCAP 160* 125* 159* 106* 140*    Lipid Profile: No results for input(s): CHOL, HDL, LDLCALC, TRIG, CHOLHDL, LDLDIRECT in the last 72 hours. Thyroid  Function Tests: No results for input(s): TSH, T4TOTAL, FREET4, T3FREE, THYROIDAB in the last 72 hours. Anemia Panel: No results for input(s): VITAMINB12, FOLATE, FERRITIN, TIBC, IRON, RETICCTPCT in the last 72 hours. Sepsis Labs: No results for input(s): PROCALCITON, LATICACIDVEN in the last 168 hours.  Recent Results (from the past 240 hour(s))  Resp Panel by RT-PCR (Flu A&B, Covid) Nasopharyngeal Swab     Status: None   Collection Time: 07/12/21 10:18 PM   Specimen: Nasopharyngeal Swab; Nasopharyngeal(NP) swabs in vial transport medium  Result Value Ref Range Status   SARS Coronavirus 2 by RT PCR NEGATIVE NEGATIVE Final    Comment: (NOTE) SARS-CoV-2 target nucleic acids are NOT DETECTED.  The SARS-CoV-2 RNA is generally detectable in upper respiratory specimens during the acute phase of infection. The lowest concentration of SARS-CoV-2 viral copies this assay can  detect is 138 copies/mL. A negative result does not preclude SARS-Cov-2 infection and should not be used as the sole basis for treatment or other patient management decisions. A negative result may occur with  improper specimen collection/handling, submission of specimen other than nasopharyngeal swab, presence of viral mutation(s) within the areas targeted by this assay, and inadequate number of viral copies(<138 copies/mL). A negative result must be combined with clinical observations, patient history, and epidemiological information. The expected result is Negative.  Fact Sheet for Patients:  EntrepreneurPulse.com.au  Fact Sheet for Healthcare Providers:  IncredibleEmployment.be  This test is no t yet approved or cleared by the [place] FDA and  has been authorized for detection and/or diagnosis of SARS-CoV-2 by FDA under an Emergency Use Authorization (EUA). This EUA will remain  in effect (meaning this test can be used) for the duration of the COVID-19 declaration under  Section 564(b)(1) of the Act, 21 U.S.C.section 360bbb-3(b)(1), unless the authorization is terminated  or revoked sooner.       Influenza A by PCR NEGATIVE NEGATIVE Final   Influenza B by PCR NEGATIVE NEGATIVE Final    Comment: (NOTE) The Xpert Xpress SARS-CoV-2/FLU/RSV plus assay is intended as an aid in the diagnosis of influenza from Nasopharyngeal swab specimens and should not be used as a sole basis for treatment. Nasal washings and aspirates are unacceptable for Xpert Xpress SARS-CoV-2/FLU/RSV testing.  Fact Sheet for Patients: EntrepreneurPulse.com.au  Fact Sheet for Healthcare Providers: IncredibleEmployment.be  This test is not yet approved or cleared by the [place] FDA and has been authorized for detection and/or diagnosis of SARS-CoV-2 by FDA under an Emergency Use Authorization (EUA). This EUA will remain in effect (meaning this test can be used) for the duration of the COVID-19 declaration under Section 564(b)(1) of the Act, 21 U.S.C. section 360bbb-3(b)(1), unless the authorization is terminated or revoked.  Performed at [facility], [place] [address] [place], [name] [address]   MRSA Next Gen by PCR, Nasal     Status: None   Collection Time: 07/16/21 10:51 AM   Specimen: Nasal Mucosa; Nasal Swab  Result Value Ref Range Status   MRSA by PCR Next Gen NOT DETECTED NOT DETECTED Final    Comment: (NOTE) The GeneXpert MRSA Assay (FDA approved for NASAL specimens only), is one component of a comprehensive MRSA colonization surveillance program. It is not intended to diagnose MRSA infection nor to guide or monitor treatment for MRSA infections. Test performance is not FDA approved in patients less than 77 years old. Performed at [facility], [place] [address], [place], [place] [address]           Radiology Studies: No results found.      Scheduled Meds:  (feeding supplement) PROSource Plus  30 mL  Oral TID BM   acyclovir  400 mg Oral BID   aspirin EC  81 mg Oral Daily   azithromycin  500 mg Oral Q24H   carvedilol  6.25 mg Oral BID WC   escitalopram  5 mg Oral Daily   feeding supplement  1 Container Oral TID BM   finasteride  5 mg Oral Daily   gabapentin  300 mg Oral BID   insulin aspart  0-5 Units Subcutaneous QHS   insulin aspart  0-9 Units Subcutaneous TID WC   multivitamin with minerals  1 tablet Oral Daily   tamsulosin  0.4 mg Oral Daily   Continuous Infusions:  cefTRIAXone (ROCEPHIN)  IV 2 g (07/16/21 1723)  LOS: 4 days    Time spent: 31 Minutes    Haidee Stogsdill A Ellinor Test, MD [facility]   If 7PM-7AM, please contact night-coverage [email or web address]  07/17/2021, 1:20 PM

## 2021-07-17 NOTE — Consult Note (Signed)
Referral MD  Reason for Referral: Leukopenia-likely iatrogenic  Chief Complaint  Patient presents with   Diarrhea  : No history  HPI: [name] is a nice 80 year old white male.  He has a history of hepatocellular carcinoma from cirrhosis.  He has had treatment for this.  I think he has had radiosurgery for this.  Has been having a lot of diarrhea.  I think he was admitted for diarrhea and weakness.  He has been on 07/12/2021.  He says the diarrhea is better.  He is followed by Dr.Kale for the hepatocellular carcinoma and the thrombocytopenia which he has.  On 07/12/2021, his white cell was 5.3.  Platelet count was 97,000.  On 07/16/2021 his white cell count was 2.6.  Platelet count 56,000.  Hemoglobin was 8.  MCV was 105.  On 07/17/2021, white cell count was 1.9.  Hemoglobin 8.6.  Platelet count 56,000.  Because white cell count has trended downward, we are asked to see him to see if we can figure out what might be going on.  He is on antibiotics with azithromycin, ceftriaxone, and acyclovir.  I do not see any positive cultures.  Unfortunately, there is no blood smear to review right now.  He feels okay.  He would like to go home.  He has had no fever.  He has had no bleeding.  There is a little bit of cellulitis on the distal right forearm.  I think he was about an IV there.  He has had no obvious bleeding.  His appetite is okay.  He is not a vegetarian.  Currently, his performance status is probably ECOG 2.  Past Medical History:  Diagnosis Date   Benign prostatic hypertrophy    Blood transfusion without reported diagnosis    CHF (congestive heart failure) ([place])    a. EF 20-25% by echo in 11/2019   COPD (chronic obstructive pulmonary disease) ([place]) 12/15/2009   FEV1 2.30 (70%) ratio 63 no better with B2 and DLCO 18/6 (73%) corrects to 106%   Degenerative joint disease    Depression    Diverticulosis    Essential hypertension    ACE inhibitor cough   Fatty liver     Hyperlipidemia    Internal hemorrhoids    Liver cancer ([place])    Low back pain    Otitis externa 07/30/2012   Splenomegaly    Type 2 diabetes mellitus ([place])    Ulcer of foot ([place])    Right foot  :   Past Surgical History:  Procedure Laterality Date   BIOPSY  07/14/2021   Procedure: BIOPSY;  Surgeon: Daryel November, MD;  Location: [facility];  Service: Gastroenterology;;  EGD and COLON   COLONOSCOPY  2020   JMP-MAC-plenvu (good)-polyps-recall 1 yr   COLONOSCOPY WITH PROPOFOL N/A 07/14/2021   Procedure: COLONOSCOPY WITH PROPOFOL;  Surgeon: Daryel November, MD;  Location: [facility];  Service: Gastroenterology;  Laterality: N/A;   ESOPHAGOGASTRODUODENOSCOPY (EGD) WITH PROPOFOL N/A 07/14/2021   Procedure: ESOPHAGOGASTRODUODENOSCOPY (EGD) WITH PROPOFOL;  Surgeon: Daryel November, MD;  Location: [facility];  Service: Gastroenterology;  Laterality: N/A;   IR EMBO TUMOR ORGAN ISCHEMIA INFARCT INC GUIDE ROADMAPPING  06/01/2020   IR RADIOLOGIST EVAL & MGMT  04/28/2020   IR RADIOLOGIST EVAL & MGMT  07/15/2020   IR RADIOLOGIST EVAL & MGMT  05/04/2021   NO PAST SURGERIES  1980  :   Current Facility-Administered Medications:    (feeding supplement) PROSource Plus liquid 30 mL, 30 mL, Oral,  TID BM, Regalado, Belkys A, MD, 30 mL at 07/17/21 1422   acetaminophen (TYLENOL) tablet 650 mg, 650 mg, Oral, Q6H PRN, 650 mg at 07/16/21 2326 **OR** acetaminophen (TYLENOL) suppository 650 mg, 650 mg, Rectal, Q6H PRN, Kristopher Oppenheim, DO   acyclovir (ZOVIRAX) tablet 400 mg, 400 mg, Oral, BID, Kristopher Oppenheim, DO, 400 mg at 07/17/21 4098   aspirin EC tablet 81 mg, 81 mg, Oral, Daily, Kristopher Oppenheim, DO, 81 mg at 07/17/21 0856   azithromycin (ZITHROMAX) tablet 500 mg, 500 mg, Oral, Q24H, Regalado, Belkys A, MD, 500 mg at 07/16/21 1720   carvedilol (COREG) tablet 6.25 mg, 6.25 mg, Oral, BID WC, Kristopher Oppenheim, DO, 6.25 mg at 07/17/21 0857   cefTRIAXone (ROCEPHIN) 2 g in sodium chloride 0.9 % 100 mL IVPB, 2 g,  Intravenous, Q24H, Regalado, Belkys A, MD, Last Rate: 200 mL/hr at 07/16/21 1723, 2 g at 07/16/21 1723   escitalopram (LEXAPRO) tablet 5 mg, 5 mg, Oral, Daily, Kristopher Oppenheim, DO, 5 mg at 07/17/21 0856   feeding supplement (BOOST / RESOURCE BREEZE) liquid 1 Container, 1 Container, Oral, TID BM, Regalado, Belkys A, MD, 1 Container at 07/17/21 1422   finasteride (PROSCAR) tablet 5 mg, 5 mg, Oral, Daily, Kristopher Oppenheim, DO, 5 mg at 07/17/21 0857   gabapentin (NEURONTIN) capsule 300 mg, 300 mg, Oral, BID, Kristopher Oppenheim, DO, 300 mg at 07/17/21 0856   insulin aspart (novoLOG) injection 0-5 Units, 0-5 Units, Subcutaneous, QHS, Bridgett Larsson, Eric, DO   insulin aspart (novoLOG) injection 0-9 Units, 0-9 Units, Subcutaneous, TID WC, Kristopher Oppenheim, DO, 1 Units at 07/17/21 1247   loperamide (IMODIUM) capsule 2 mg, 2 mg, Oral, TID BM PRN, Daryel November, MD   multivitamin with minerals tablet 1 tablet, 1 tablet, Oral, Daily, Regalado, Belkys A, MD, 1 tablet at 07/17/21 0857   ondansetron (ZOFRAN) tablet 4 mg, 4 mg, Oral, Q6H PRN **OR** ondansetron (ZOFRAN) injection 4 mg, 4 mg, Intravenous, Q6H PRN, Kristopher Oppenheim, DO   tamsulosin [facility]) capsule 0.4 mg, 0.4 mg, Oral, Daily, Kristopher Oppenheim, DO, 0.4 mg at 07/17/21 1191:   (feeding supplement) PROSource Plus  30 mL Oral TID BM   acyclovir  400 mg Oral BID   aspirin EC  81 mg Oral Daily   azithromycin  500 mg Oral Q24H   carvedilol  6.25 mg Oral BID WC   escitalopram  5 mg Oral Daily   feeding supplement  1 Container Oral TID BM   finasteride  5 mg Oral Daily   gabapentin  300 mg Oral BID   insulin aspart  0-5 Units Subcutaneous QHS   insulin aspart  0-9 Units Subcutaneous TID WC   multivitamin with minerals  1 tablet Oral Daily   tamsulosin  0.4 mg Oral Daily  :  No Known Allergies:   Family History  Problem Relation Age of Onset   Melanoma Mother    Stroke Father    CAD Brother        Premature disease   Colon polyps Neg Hx    Colon cancer Neg Hx    Esophageal cancer  Neg Hx    Stomach cancer Neg Hx    Rectal cancer Neg Hx   :   Social History   Socioeconomic History   Marital status: Unknown    Spouse name: Not on file   Number of children: Not on file   Years of education: Not on file   Highest education level: Not on file  Occupational History  Occupation: retired    Comment: maintenence work  Tobacco Use   Smoking status: Former    Packs/day: 4.00    Years: 20.00    Pack years: 80.00    Types: Cigarettes    Quit date: 09/18/1984    Years since quitting: 36.8   Smokeless tobacco: Never   Tobacco comments:    quit in 1980  Vaping Use   Vaping Use: Never used  Substance and Sexual Activity   Alcohol use: No    Comment: no drinking since 40 years plus   Drug use: No   Sexual activity: Yes  Other Topics Concern   Not on file  Social History Narrative   ** Merged History Encounter **       Married 53 years in 2015. 4 kids (2 sons) and 3 grandkids.    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            Social Determinants of Radio broadcast assistant Strain: Low Risk    Difficulty of Paying Living Expenses: Not hard at all  Food Insecurity: No Food Insecurity   Worried About Charity fundraiser in the Last Year: Never true   Arboriculturist in the Last Year: Never true  Transportation Needs: No Transportation Needs   Lack of Transportation (Medical): No   Lack of Transportation (Non-Medical): No  Physical Activity: Inactive   Days of Exercise per Week: 0 days   Minutes of Exercise per Session: 0 min  Stress: No Stress Concern Present   Feeling of Stress : Not at all  Social Connections: Moderately Integrated   Frequency of Communication with Friends and Family: More than three times a week   Frequency of Social Gatherings with Friends and Family: Twice a week   Attends Religious Services: More than 4 times per year   Active  Member of [employer] or Organizations: No   Attends Archivist Meetings: Never   Marital Status: Married  Human resources officer Violence: Not At Risk   Fear of Current or Ex-Partner: No   Emotionally Abused: No   Physically Abused: No   Sexually Abused: No  :  Pertinent items are noted in HPI.  Exam: Patient Vitals for the past 24 hrs:  BP Temp Temp src Pulse Resp SpO2 Weight  07/17/21 1324 118/72 97.8 F (36.6 C) Oral 81 20 93 % --  07/17/21 0827 123/70 98.1 F (36.7 C) Oral 89 18 96 % --  07/17/21 0344 -- -- -- -- -- -- 205 lb 14.6 oz (93.4 kg)  07/17/21 0333 112/63 98.5 F (36.9 C) Axillary 90 16 96 % --  07/16/21 2328 -- -- -- -- -- 96 % --  07/16/21 2321 122/81 98.5 F (36.9 C) Oral 89 17 -- --  07/16/21 2130 -- -- -- -- -- 95 % --  07/16/21 1945 (!) 117/52 99.6 F (37.6 C) Oral 99 18 -- --  07/16/21 1644 (!) 142/64 99.2 F (37.3 C) Oral 98 17 -- --   This is an elderly white gentleman in no obvious distress.  Vital signs show temperature of 97.8.  Pulse 81.  Blood pressure 118/72.  Head and neck exam shows no scleral icterus.  There is no oral lesions.  He has no adenopathy in the neck.  Lungs are clear bilaterally.  Cardiac exam regular rate  and rhythm.  He has no murmurs.  Abdomen is obese.  There is no obvious fluid wave.  There is no obvious splenomegaly or hepatomegaly.  Extremities shows some chronic edema in his legs.  Skin exam shows scattered ecchymoses.  Neurological exam is nonfocal.  Recent Labs    07/17/21 0755 07/17/21 1103  WBC 1.9* 2.2*  HGB 8.6* 8.9*  HCT 25.7* 26.6*  PLT 56* 54*    Recent Labs    07/16/21 0122 07/17/21 0755  NA 134* 135  K 4.4 4.4  CL 105 105  CO2 25 25  GLUCOSE 146* 102*  BUN 18 18  CREATININE 1.01 0.80  CALCIUM 8.4* 8.5*    Blood smear review: Pending  Pathology: None    Assessment and Plan: Mr. Rivero is an 80 year old white male.  He has hepatocellular carcinoma.  He has had radiosurgery for this.  He was  admitted I guess because of diarrhea.  He is on antibiotics.  His white cell count is been trending downward.  It is holding steady right now.  Had to believe that this leukopenia is iatrogenic.  His white cell count was okay when he came in.  Given that he does have splenomegaly, it would not surprise me if he does have some sequestration of his white cells, in addition to his platelets.  He is asymptomatic.  I think that there is white cell count is lower, we can was give him some Neupogen or Neulasta which I am sure will get his white cell count back up.  I will think that the antibiotics might be stopped.  Again I will send a positive cultures.  Again, I had to believe that the splenomegaly and cirrhosis do have an etiology in the development of this leukopenia.  We will have to see what his blood smear looks like.  We will have to see what his white cell differential looks like.  He does look quite good.  We had an excellent prayer session.  He has a very strong faith.  I really enjoyed meeting him.  I will let Dr.Kale know of his admission on Monday.  Joshua Haw, MD  Romans 1:16

## 2021-07-18 DIAGNOSIS — C220 Liver cell carcinoma: Secondary | ICD-10-CM | POA: Diagnosis not present

## 2021-07-18 DIAGNOSIS — D72819 Decreased white blood cell count, unspecified: Secondary | ICD-10-CM

## 2021-07-18 DIAGNOSIS — R197 Diarrhea, unspecified: Secondary | ICD-10-CM | POA: Diagnosis not present

## 2021-07-18 DIAGNOSIS — R531 Weakness: Secondary | ICD-10-CM | POA: Diagnosis not present

## 2021-07-18 LAB — CBC WITH DIFFERENTIAL/PLATELET
Abs Immature Granulocytes: 0.02 K/uL (ref 0.00–0.07)
Basophils Absolute: 0 K/uL (ref 0.0–0.1)
Basophils Relative: 1 %
Eosinophils Absolute: 0.1 K/uL (ref 0.0–0.5)
Eosinophils Relative: 3 %
HCT: 24 % — ABNORMAL LOW (ref 39.0–52.0)
Hemoglobin: 7.9 g/dL — ABNORMAL LOW (ref 13.0–17.0)
Immature Granulocytes: 1 %
Lymphocytes Relative: 23 %
Lymphs Abs: 0.4 K/uL — ABNORMAL LOW (ref 0.7–4.0)
MCH: 34.5 pg — ABNORMAL HIGH (ref 26.0–34.0)
MCHC: 32.9 g/dL (ref 30.0–36.0)
MCV: 104.8 fL — ABNORMAL HIGH (ref 80.0–100.0)
Monocytes Absolute: 0.2 K/uL (ref 0.1–1.0)
Monocytes Relative: 9 %
Neutro Abs: 1.1 K/uL — ABNORMAL LOW (ref 1.7–7.7)
Neutrophils Relative %: 63 %
Platelets: 56 K/uL — ABNORMAL LOW (ref 150–400)
RBC: 2.29 MIL/uL — ABNORMAL LOW (ref 4.22–5.81)
RDW: 17 % — ABNORMAL HIGH (ref 11.5–15.5)
WBC: 1.7 K/uL — ABNORMAL LOW (ref 4.0–10.5)
nRBC: 0 % (ref 0.0–0.2)

## 2021-07-18 LAB — URINE CULTURE: Culture: <10000 — AB

## 2021-07-18 LAB — GLUCOSE, CAPILLARY
Glucose-Capillary: 122 mg/dL — ABNORMAL HIGH (ref 70–99)
Glucose-Capillary: 113 mg/dL — ABNORMAL HIGH (ref 70–99)
Glucose-Capillary: 135 mg/dL — ABNORMAL HIGH (ref 70–99)
Glucose-Capillary: 137 mg/dL — ABNORMAL HIGH (ref 70–99)

## 2021-07-18 LAB — SAVE SMEAR(SSMR), FOR PROVIDER SLIDE REVIEW

## 2021-07-18 MED ORDER — SODIUM CHLORIDE 0.9 % IV SOLN: INTRAVENOUS | Status: DC | PRN

## 2021-07-18 MED ORDER — TBO-FILGRASTIM 480 MCG/0.8ML ~~LOC~~ SOSY
480.0000 ug | PREFILLED_SYRINGE | Freq: Once | SUBCUTANEOUS | Status: AC
  Administered 2021-07-18: 480 ug via SUBCUTANEOUS
  Filled 2021-07-18: qty 0.8

## 2021-07-18 NOTE — Progress Notes (Signed)
I will get his blood smear this morning.  He does have decreased white blood cells.  His white blood cell count is 1.7.  On the blood smear, he has mature polys.  I do not see any immature myeloid or lymphoid forms.  There are no nucleated red blood cells.  His CBC shows white cell count of 1.7.  Hemoglobin 7.9.  Platelet count 56,000.  He has a normal white cell differential.  Again I have to believe this is iatrogenic.  I had believe this is from antibiotics.  His cultures are negative.  Hopefully antibiotics can be stopped.  I will give him a dose of Neupogen.  We will see how this can raise up his white blood cells.  I would think that his white blood cells should come up pretty readily.  He is afebrile.  His temperature is 98.9.  Pulse 80.  Blood pressure 143/76.  His oral exam does not show mucositis.  His lungs are clear.  Cardiac exam regular rate and rhythm.  Abdomen is soft.  There is no fluid wave.  He is obese.  He has no obvious palpable splenomegaly.  Again, I think the white cell count is low because of medications.  His CT scan that was done on 07/12/2021 does not show any obvious splenomegaly.  We will see what Neupogen can do for [place].  Lattie Haw, MD  Philippians 1:3

## 2021-07-18 NOTE — Progress Notes (Signed)
PROGRESS NOTE    [name]  GHW:[id number] DOB: [birth year] DOA: 07/12/2021 PCP: [name], MD   Brief Narrative: 80 year old with past medical history significant for thrombocytopenia, type 2 diabetes, anemia, B12 deficiency, hepatocellular carcinoma treated with SBRT  who presents to the emergency room  with 1 month history of watery diarrhea. Recent follow-up with GI as an outpatient.  Recent C. difficile and GI pathogen negative.  He presented with generalized weakness. Underwent endoscopy and colonoscopy.  Awaiting colonoscopy results.   Assessment & Plan:   Principal Problem:   Generalized weakness Active Problems:   DM (diabetes mellitus) type II controlled, neurological manifestation (HCC)   Essential hypertension   COPD (chronic obstructive pulmonary disease) (HCC)   CKD (chronic kidney disease), stage III (HCC)   Hepatocellular carcinoma (HCC)   Chronic diarrhea   Diarrhea due to malabsorption   Cirrhosis of liver without ascites (HCC)   Mucosal abnormality of stomach   Portal hypertensive gastropathy (HCC)   Secondary esophageal varices without bleeding (HCC)   Malnutrition of moderate degree   1-Generalized weakness: -Related to poor oral intake and acute illness.  -PT   2-Fever; PNA, UTI Chest x ray with possible PNA.  UA with more than 50 WBC>  Received IV ceftriaxone and azithromycin for 3 days./   Afebrile last 24 hours.  Monitor off antibiotics.   Pancytopenia; related to infection, monitor. Acute on chronic. Cirrhosis.  Worsening leukopenia. Dr [name] consulted.  Plan for Granix.  Stop antibiotics.   Chronic Diarrhea: -CT abdomen: Known hepatic lesion appears stable from recent MRI.  Slightly increased degrees of ascites when compared to prior study. C. difficile negative out patient.  Underwent endoscopy and colonoscopy: Endoscopy has a small flat lesion in the stomach, biopsies obtained.  Colonoscopy diverticulosis, biopsy obtained to  rule out microscopic colitis GI recommended to schedule Imodium.  Improved.   Hepatocellular carcinoma: Chronic  Diabetes type 2: Stable  Hypertension: On carvedilol.   COPD: Stable  CKD stage IIIa  Stable  History of systolic heart failure, ejection fraction 11/2019 25%, repeated echo 05/2020, EF 55 % On entresto, hold while having diarrhea.  Holding entresto  Hypomagnesemia; replaced.  Hyponatremia; resolved with IV fluids.     Nutrition Problem: Moderate Malnutrition Etiology: chronic illness (COPD, ILD)    Signs/Symptoms: energy intake < or equal to 75% for > or equal to 1 month, mild fat depletion, moderate fat depletion, mild muscle depletion, moderate muscle depletion    Interventions: Boost Breeze, MVI, Prostat  Estimated body mass index is 28.35 kg/m as calculated from the following:   Height as of this encounter: 5\' 11"  (1.803 m).   Weight as of this encounter: 92.2 kg.   DVT prophylaxis: SCD Code Status: Full Code Family Communication: care discussed with patient.  Disposition Plan:  Status is: Inpatient  Remains inpatient appropriate because:Inpatient level of care appropriate due to severity of illness  Dispo: The patient is from: Home              Anticipated d/c is to: Home              Patient currently is not medically stable to d/c.   Difficult to place patient No        Consultants:  GI  Procedures:  Endoscopy Colonoscopy   Antimicrobials:    Subjective: He wants to go home, diarrhea improved.  Objective: Vitals:   07/18/21 0408 07/18/21 0410 07/18/21 0822 07/18/21 1236  BP: (!) 142/76 (!) 142/76  111/73 109/70  Pulse: 80  83 87  Resp: 18  20 20   Temp: 98.9 F (37.2 C)  97.7 F (36.5 C) 97.9 F (36.6 C)  TempSrc: Oral  Oral Oral  SpO2: 99%  92% 93%  Weight: 92.2 kg     Height:        Intake/Output Summary (Last 24 hours) at 07/18/2021 1527 Last data filed at 07/18/2021 0045 Gross per 24 hour  Intake 122.5 ml   Output --  Net 122.5 ml    Filed Weights   07/16/21 0500 07/17/21 0344 07/18/21 0408  Weight: 92.4 kg 93.4 kg 92.2 kg    Examination:  General exam: NAD Respiratory system: CTA Cardiovascular system: S 1 S 2 RRR Gastrointestinal system: BS present, soft, nt Central nervous system: alert Extremities: no edema    Data Reviewed: I have personally reviewed following labs and imaging studies  CBC: Recent Labs  Lab 07/12/21 1311 07/13/21 0318 07/15/21 1028 07/16/21 0122 07/17/21 0755 07/17/21 1103 07/18/21 0142  WBC 5.3 3.9* 2.9* 2.6* 1.9* 2.2* 1.7*  NEUTROABS 4.2 2.6  --   --   --  1.6* 1.1*  HGB 11.0* 9.7* 8.7* 8.0* 8.6* 8.9* 7.9*  HCT 32.8* 28.4* 25.8* 23.9* 25.7* 26.6* 24.0*  MCV 103.8* 102.9* 104.5* 104.8* 104.5* 105.1* 104.8*  PLT 97* 81* 62* 56* 56* 54* 56*    Basic Metabolic Panel: Recent Labs  Lab 07/12/21 1311 07/13/21 0318 07/14/21 1337 07/16/21 0122 07/17/21 0755  NA 133* 133* 137 134* 135  K 4.4 4.2 4.0 4.4 4.4  CL 104 104 106 105 105  CO2 21* 22 24 25 25   GLUCOSE 121* 94 147* 146* 102*  BUN 10 11 12 18 18   CREATININE 0.87 0.86 0.87 1.01 0.80  CALCIUM 8.8* 8.4* 8.6* 8.4* 8.5*  MG 1.3* 1.9  --   --   --     GFR: Estimated Creatinine Clearance: 85.5 mL/min (by C-G formula based on SCr of 0.8 mg/dL). Liver Function Tests: Recent Labs  Lab 07/12/21 1311 07/13/21 0318  AST 30 27  ALT 16 13  ALKPHOS 82 82  BILITOT 1.0 1.1  PROT 6.0* 5.3*  ALBUMIN 3.1* 2.7*    Recent Labs  Lab 07/12/21 1311  LIPASE 48    No results for input(s): AMMONIA in the last 168 hours. Coagulation Profile: No results for input(s): INR, PROTIME in the last 168 hours. Cardiac Enzymes: No results for input(s): CKTOTAL, CKMB, CKMBINDEX, TROPONINI in the last 168 hours. BNP (last 3 results) No results for input(s): PROBNP in the last 8760 hours. HbA1C: No results for input(s): HGBA1C in the last 72 hours.  CBG: Recent Labs  Lab 07/17/21 1243  07/17/21 1628 07/17/21 2041 07/18/21 0829 07/18/21 1242  GLUCAP 140* 141* 210* 122* 113*    Lipid Profile: No results for input(s): CHOL, HDL, LDLCALC, TRIG, CHOLHDL, LDLDIRECT in the last 72 hours. Thyroid Function Tests: No results for input(s): TSH, T4TOTAL, FREET4, T3FREE, THYROIDAB in the last 72 hours. Anemia Panel: No results for input(s): VITAMINB12, FOLATE, FERRITIN, TIBC, IRON, RETICCTPCT in the last 72 hours. Sepsis Labs: No results for input(s): PROCALCITON, LATICACIDVEN in the last 168 hours.  Recent Results (from the past 240 hour(s))  Resp Panel by RT-PCR (Flu A&B, Covid) Nasopharyngeal Swab     Status: None   Collection Time: 07/12/21 10:18 PM   Specimen: Nasopharyngeal Swab; Nasopharyngeal(NP) swabs in vial transport medium  Result Value Ref Range Status   SARS Coronavirus 2 by RT PCR  NEGATIVE NEGATIVE Final    Comment: (NOTE) SARS-CoV-2 target nucleic acids are NOT DETECTED.  The SARS-CoV-2 RNA is generally detectable in upper respiratory specimens during the acute phase of infection. The lowest concentration of SARS-CoV-2 viral copies this assay can detect is 138 copies/mL. A negative result does not preclude SARS-Cov-2 infection and should not be used as the sole basis for treatment or other patient management decisions. A negative result may occur with  improper specimen collection/handling, submission of specimen other than nasopharyngeal swab, presence of viral mutation(s) within the areas targeted by this assay, and inadequate number of viral copies(<138 copies/mL). A negative result must be combined with clinical observations, patient history, and epidemiological information. The expected result is Negative.  Fact Sheet for Patients:  EntrepreneurPulse.com.au  Fact Sheet for Healthcare Providers:  IncredibleEmployment.be  This test is no t yet approved or cleared by the [place] FDA and  has been authorized  for detection and/or diagnosis of SARS-CoV-2 by FDA under an Emergency Use Authorization (EUA). This EUA will remain  in effect (meaning this test can be used) for the duration of the COVID-19 declaration under Section 564(b)(1) of the Act, 21 U.S.C.section 360bbb-3(b)(1), unless the authorization is terminated  or revoked sooner.       Influenza A by PCR NEGATIVE NEGATIVE Final   Influenza B by PCR NEGATIVE NEGATIVE Final    Comment: (NOTE) The Xpert Xpress SARS-CoV-2/FLU/RSV plus assay is intended as an aid in the diagnosis of influenza from Nasopharyngeal swab specimens and should not be used as a sole basis for treatment. Nasal washings and aspirates are unacceptable for Xpert Xpress SARS-CoV-2/FLU/RSV testing.  Fact Sheet for Patients: EntrepreneurPulse.com.au  Fact Sheet for Healthcare Providers: IncredibleEmployment.be  This test is not yet approved or cleared by the [place] FDA and has been authorized for detection and/or diagnosis of SARS-CoV-2 by FDA under an Emergency Use Authorization (EUA). This EUA will remain in effect (meaning this test can be used) for the duration of the COVID-19 declaration under Section 564(b)(1) of the Act, 21 U.S.C. section 360bbb-3(b)(1), unless the authorization is terminated or revoked.  Performed at [facility], [place] [address] [place], [place] [address]   MRSA Next Gen by PCR, Nasal     Status: None   Collection Time: 07/16/21 10:51 AM   Specimen: Nasal Mucosa; Nasal Swab  Result Value Ref Range Status   MRSA by PCR Next Gen NOT DETECTED NOT DETECTED Final    Comment: (NOTE) The GeneXpert MRSA Assay (FDA approved for NASAL specimens only), is one component of a comprehensive MRSA colonization surveillance program. It is not intended to diagnose MRSA infection nor to guide or monitor treatment for MRSA infections. Test performance is not FDA approved in patients less than 57  years old. Performed at [facility], [place] [address] [place], [place] [address]   Urine Culture     Status: Abnormal   Collection Time: 07/17/21  7:45 AM   Specimen: Urine, Clean Catch  Result Value Ref Range Status   Specimen Description URINE, CLEAN CATCH  Final   Special Requests NONE  Final   Culture (A)  Final    <10,000 COLONIES/mL INSIGNIFICANT GROWTH Performed at [facility], [place] [address] [place],  [address]    Report Status 07/18/2021 FINAL  Final          Radiology Studies: No results found.      Scheduled Meds:  (feeding supplement) PROSource Plus  30  mL Oral TID BM   acyclovir  400 mg Oral BID   aspirin EC  81 mg Oral Daily   carvedilol  6.25 mg Oral BID WC   escitalopram  5 mg Oral Daily   feeding supplement  1 Container Oral TID BM   finasteride  5 mg Oral Daily   gabapentin  300 mg Oral BID   insulin aspart  0-5 Units Subcutaneous QHS   insulin aspart  0-9 Units Subcutaneous TID WC   multivitamin with minerals  1 tablet Oral Daily   tamsulosin  0.4 mg Oral Daily   Continuous Infusions:  sodium chloride Stopped (07/17/21 2215)     LOS: 5 days    Time spent: [place], MD [facility]   If 7PM-7AM, please contact night-coverage [email or web address]  07/18/2021, 3:27 PM

## 2021-07-19 DIAGNOSIS — R531 Weakness: Secondary | ICD-10-CM | POA: Diagnosis not present

## 2021-07-19 LAB — CBC WITH DIFFERENTIAL/PLATELET
Abs Immature Granulocytes: 0.2 10*3/uL — ABNORMAL HIGH (ref 0.00–0.07)
Basophils Absolute: 0 10*3/uL (ref 0.0–0.1)
Basophils Relative: 0 %
Eosinophils Absolute: 0.1 10*3/uL (ref 0.0–0.5)
Eosinophils Relative: 1 %
HCT: 28.2 % — ABNORMAL LOW (ref 39.0–52.0)
Hemoglobin: 9.2 g/dL — ABNORMAL LOW (ref 13.0–17.0)
Immature Granulocytes: 3 %
Lymphocytes Relative: 8 %
Lymphs Abs: 0.6 10*3/uL — ABNORMAL LOW (ref 0.7–4.0)
MCH: 34.7 pg — ABNORMAL HIGH (ref 26.0–34.0)
MCHC: 32.6 g/dL (ref 30.0–36.0)
MCV: 106.4 fL — ABNORMAL HIGH (ref 80.0–100.0)
Monocytes Absolute: 0.4 10*3/uL (ref 0.1–1.0)
Monocytes Relative: 6 %
Neutro Abs: 6.4 10*3/uL (ref 1.7–7.7)
Neutrophils Relative %: 82 %
Platelets: 68 10*3/uL — ABNORMAL LOW (ref 150–400)
RBC: 2.65 MIL/uL — ABNORMAL LOW (ref 4.22–5.81)
RDW: 17.2 % — ABNORMAL HIGH (ref 11.5–15.5)
WBC: 7.8 10*3/uL (ref 4.0–10.5)
nRBC: 0 % (ref 0.0–0.2)

## 2021-07-19 LAB — BASIC METABOLIC PANEL
Anion gap: 7 (ref 5–15)
BUN: 20 mg/dL (ref 8–23)
CO2: 25 mmol/L (ref 22–32)
Calcium: 8.7 mg/dL — ABNORMAL LOW (ref 8.9–10.3)
Chloride: 104 mmol/L (ref 98–111)
Creatinine, Ser: 0.98 mg/dL (ref 0.61–1.24)
GFR, Estimated: >60 mL/min (ref >60)
Glucose, Bld: 117 mg/dL — ABNORMAL HIGH (ref 70–99)
Potassium: 4.5 mmol/L (ref 3.5–5.1)
Sodium: 136 mmol/L (ref 135–145)

## 2021-07-19 LAB — GLUCOSE, CAPILLARY
Glucose-Capillary: 109 mg/dL — ABNORMAL HIGH (ref 70–99)
Glucose-Capillary: 166 mg/dL — ABNORMAL HIGH (ref 70–99)

## 2021-07-19 LAB — SURGICAL PATHOLOGY

## 2021-07-19 MED ORDER — ADULT MULTIVITAMIN W/MINERALS CH: 1.0000 | ORAL_TABLET | Freq: Every day | ORAL | 0 refills | Status: DC

## 2021-07-19 MED ORDER — LOPERAMIDE HCL 2 MG PO CAPS: 2.0000 mg | ORAL_CAPSULE | Freq: Three times a day (TID) | ORAL | 0 refills | Status: DC | PRN

## 2021-07-19 NOTE — Progress Notes (Signed)
Occupational Therapy Treatment Patient Details Name: [name] MRN: [id number] DOB: [birth year] Today's Date: 07/19/2021   History of present illness Pt is an 80 y/o male admitted 10/3 secondary to diarrhea and weakness. PMH includes hepatocellular carcinoma, DM, CKD, COPD, and HTN.  10/7: Fever 102 and Chest x ray with possible PNA.   OT comments  Patient received in bed and was agreeable to OT treatment but did not want to stand at sink or ambulate due to complaints of bottom of feet when walking. Patient was able to perform foot care and change footwear while sitting on eob and performed static standing but asked to limit standing. AROM performed with cane exercises.  Acute OT to continue to follow.    Recommendations for follow up therapy are one component of a multi-disciplinary discharge planning process, led by the attending physician.  Recommendations may be updated based on patient status, additional functional criteria and insurance authorization.    Follow Up Recommendations  No OT follow up    Equipment Recommendations  None recommended by OT    Recommendations for Other Services      Precautions / Restrictions Precautions Precautions: Fall Precaution Comments: watch O2 sats Restrictions Weight Bearing Restrictions: No       Mobility Bed Mobility Overal bed mobility: Needs Assistance Bed Mobility: Supine to Sit;Sit to Supine     Supine to sit: Modified independent (Device/Increase time) Sit to supine: Modified independent (Device/Increase time)   General bed mobility comments: patient did not require physical assistance with bed mobility    Transfers Overall transfer level: Needs assistance     Sit to Stand: Supervision         General transfer comment: performed standing at eob    Balance Overall balance assessment: Needs assistance Sitting-balance support: No upper extremity supported;Feet supported Sitting balance-Leahy Scale: Good      Standing balance support: Single extremity supported Standing balance-Leahy Scale: Fair Standing balance comment: stood with cane                           ADL either performed or assessed with clinical judgement   ADL Overall ADL's : Needs assistance/impaired             Lower Body Bathing: Supervison/ safety;Sit to/from stand Lower Body Bathing Details (indicate cue type and reason): patient bathed feet and applied lotion seated on eob     Lower Body Dressing: Supervision/safety;Sitting/lateral leans Lower Body Dressing Details (indicate cue type and reason): able to change socks             Functional mobility during ADLs: Cane;Supervision/safety General ADL Comments: peformed standing from eob only     Vision       Perception     Praxis      Cognition Arousal/Alertness: Awake/alert Behavior During Therapy: WFL for tasks assessed/performed Overall Cognitive Status: Within Functional Limits for tasks assessed                                 General Comments: patient oriented x3 and followed directions        Exercises Exercises: General Upper Extremity General Exercises - Upper Extremity Shoulder Flexion: AROM;Both;15 reps;Seated Shoulder ADduction: AROM;Both;15 reps;Seated Elbow Flexion: AROM;Both;15 reps;Seated Elbow Extension: AROM;Both;15 reps;Seated   Shoulder Instructions       General Comments      Pertinent Vitals/ Pain  Pain Assessment:  (patient states he only has pain when he walks and on the bottom of his feet)  Home Living                                          Prior Functioning/Environment              Frequency  Min 2X/week        Progress Toward Goals  OT Goals(current goals can now be found in the care plan section)  Progress towards OT goals: Progressing toward goals  Acute Rehab OT Goals Patient Stated Goal: to go home OT Goal Formulation: With patient Time  For Goal Achievement: 07/28/21 Potential to Achieve Goals: Good ADL Goals Pt Will Perform Lower Body Bathing: with modified independence;sit to/from stand Pt Will Perform Lower Body Dressing: with modified independence;sit to/from stand Pt Will Transfer to Toilet: with modified independence;ambulating;regular height toilet Pt Will Perform Toileting - Clothing Manipulation and hygiene: with modified independence;sit to/from stand  Plan Discharge plan remains appropriate    Co-evaluation                 AM-PAC OT "6 Clicks" Daily Activity     Outcome Measure   Help from another person eating meals?: None Help from another person taking care of personal grooming?: None Help from another person toileting, which includes using toliet, bedpan, or urinal?: A Little Help from another person bathing (including washing, rinsing, drying)?: A Little Help from another person to put on and taking off regular upper body clothing?: None Help from another person to put on and taking off regular lower body clothing?: A Little 6 Click Score: 21    End of Session Equipment Utilized During Treatment: Other (comment) (cane)  OT Visit Diagnosis: Unsteadiness on feet (R26.81)   Activity Tolerance Other (comment) (patient limited by complaints of pain when ambulating or standing for prolonged periods of time)   Patient Left in bed;with call bell/phone within reach   Nurse Communication Mobility status        Time: 3159-4585 OT Time Calculation (min): 22 min  Charges: OT General Charges $OT Visit: 1 Visit OT Treatments $Self Care/Home Management : 8-22 mins  Lodema Hong, OTA   [place] Alexis Goodell 07/19/2021, 11:57 AM

## 2021-07-19 NOTE — TOC Transition Note (Signed)
Transition of Care [facility]) - CM/SW Discharge Note   Patient Details  Name: [name] MRN: [id number] Date of Birth: Feb 28, 1941  [facility] CM/SW Contact:  Cyndi Bender, RN Phone Number: 07/19/2021, 3:03 PM   Clinical Narrative:   Patient stabe for discharge. Marjory Lies with [place] notified of discharge for [facility] PT needs.     Final next level of care: [place] Barriers to Discharge: Barriers Resolved   Patient Goals and CMS Choice        Discharge Placement             Home with [place]          Discharge Plan and Services                          HH Arranged: PT [facility] Agency: [place] Date [place]: 07/15/21 Time [place]: [id number] Representative spoke with at [place]: [place] ([place]) Interventions     Readmission Risk Interventions Readmission Risk Prevention Plan 07/19/2021  Transportation Screening Complete  PCP or Specialist Appt within 5-7 Days Complete  Home Care Screening Complete  Some recent data might be hidden

## 2021-07-19 NOTE — Discharge Summary (Signed)
Physician Discharge Summary  HOLDYN POYSER UXN:[id number] DOB: 07/04/1941 DOA: 07/12/2021  PCP: [name], MD  Admit date: 07/12/2021 Discharge date: 07/19/2021  Admitted From: Home  Disposition:  Home   Recommendations for Outpatient Follow-up:  Follow up with PCP in 1-2 weeks Please obtain BMP/CBC in one week Follow up bx result with GI.  Follow WBC.  Consider resuming lasix if BP allows it and patient is not having worsening diarrhea.   Home Health:  Discharge Condition: Stable.  CODE STATUS: Full code Diet recommendation: Heart Healthy  Brief/Interim Summary: 80 year old with past medical history significant for thrombocytopenia, type 2 diabetes, anemia, B12 deficiency, hepatocellular carcinoma treated with SBRT  who presents to the emergency room  with 1 month history of watery diarrhea. Recent follow-up with GI as an outpatient.  Recent C. difficile and GI pathogen negative.  He presented with generalized weakness. Underwent endoscopy and colonoscopy.  Awaiting colonoscopy results.      1-Generalized weakness: -Related to poor oral intake and acute illness.  -PT    2-Fever; PNA, UTI Chest x ray with possible PNA.  UA with more than 50 WBC>  Received IV ceftriaxone and azithromycin for 3 days./   Afebrile last 48 hours.  Monitor off antibiotics.    Pancytopenia; related to infection, monitor. Acute on chronic. Cirrhosis.  Worsening leukopenia. Dr [name] consulted.  Received  Granix.  Stop antibiotics.  WBC increase to 7.  Stable for discharge.   Chronic Diarrhea: -CT abdomen: Known hepatic lesion appears stable from recent MRI.  Slightly increased degrees of ascites when compared to prior study. C. difficile negative out patient.  Underwent endoscopy and colonoscopy: Endoscopy has a small flat lesion in the stomach, biopsies obtained.  Colonoscopy diverticulosis, biopsy obtained to rule out microscopic colitis GI recommended to schedule Imodium.   Improved.    Hepatocellular carcinoma: Chronic   Diabetes type 2: Stable   Hypertension: On carvedilol.    COPD: Stable   CKD stage IIIa  Stable  History of systolic heart failure, ejection fraction 11/2019 25%, repeated echo 05/2020, EF 55 % On entresto, hold while having diarrhea.  Resume entresto at discharge. Holding lasix. Resume lasix if BP remain stable.    Hypomagnesemia; replaced.  Hyponatremia; resolved with IV fluids.       Moderate malnutrition; on supplement.  Nutrition Problem: Moderate Malnutrition Etiology: chronic illness (COPD, ILD)       Signs/Symptoms: energy intake < or equal to 75% for > or equal to 1 month, mild fat depletion, moderate fat depletion, mild muscle depletion, moderate muscle depletion       Interventions: Boost Breeze, MVI, Prostat   Estimated body mass index is 28.35 kg/m as calculated from the following:   Height as of this encounter: _0  (1.803 m).   Weight as of this encounter: 92.2 kg.     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   Cirrhosis of liver without ascites (HCC)   Mucosal abnormality of stomach   Portal hypertensive gastropathy (HCC)   Secondary esophageal varices without bleeding (HCC)   Malnutrition of moderate degree    Discharge Instructions  Discharge Instructions     Diet - low sodium heart healthy  Complete by: As directed    Increase activity slowly   Complete by: As directed       Allergies as of 07/19/2021   No Known Allergies      Medication List     STOP taking these medications    acetaminophen 500 MG tablet Commonly known as: TYLENOL    acyclovir 400 MG tablet Commonly known as: ZOVIRAX   clotrimazole 1 % cream Commonly known as: LOTRIMIN   clotrimazole-betamethasone cream Commonly known as: LOTRISONE   famotidine 20 MG tablet Commonly known as: PEPCID   furosemide 20 MG tablet Commonly known as: LASIX   metFORMIN 500 MG tablet Commonly known as: GLUCOPHAGE   mupirocin ointment 2 % Commonly known as: BACTROBAN   Suprep Bowel Prep Kit 17.5-3.13-1.6 GM/177ML Soln Generic drug: Na Sulfate-K Sulfate-Mg Sulf       TAKE these medications    albuterol 108 (90 Base) MCG/ACT inhaler Commonly known as: VENTOLIN HFA Inhale 2 puffs into the lungs every 6 (six) hours as needed for wheezing or shortness of breath.   aspirin EC 81 MG tablet Take 81 mg by mouth daily.   busPIRone 7.5 MG tablet Commonly known as: BUSPAR Take 1 tablet (7.5 mg total) by mouth 2 (two) times daily as needed. for anxiety What changed:  when to take this additional instructions   carvedilol 6.25 MG tablet Commonly known as: COREG TAKE 1 TABLET TWICE DAILY (DOSE INCREASE) What changed: See the new instructions.   Entresto 49-51 MG Generic drug: sacubitril-valsartan TAKE 1 TABLET TWICE DAILY   escitalopram 5 MG tablet Commonly known as: LEXAPRO Take 1 tablet (5 mg total) by mouth daily.   finasteride 5 MG tablet Commonly known as: PROSCAR TAKE 1 TABLET EVERY DAY   gabapentin 300 MG capsule Commonly known as: NEURONTIN TAKE 1 CAPSULE TWICE DAILY   loperamide 2 MG capsule Commonly known as: IMODIUM Take 1 capsule (2 mg total) by mouth 3 (three) times daily between meals as needed for diarrhea or loose stools.   multivitamin with minerals Tabs tablet Take 1 tablet by mouth daily. Start taking on: July 20, 2021   REFRESH OP Place 1 drop into both eyes at bedtime.   rosuvastatin 10 MG tablet Commonly known as: CRESTOR TAKE 1 TABLET EVERY DAY What changed:  how much to take how to take this when to take  this additional instructions   tamsulosin 0.4 MG Caps capsule Commonly known as: FLOMAX TAKE 1 CAPSULE EVERY DAY   True Metrix Air Glucose Meter w/Device Kit   True Metrix Blood Glucose Test test strip Generic drug: glucose blood TEST BLOOD SUGAR EVERY DAY   TRUEplus Lancets 28G Misc Use to test blood sugars two times daily. Dx: E11.9        Follow-up Information     [facility] Follow up.   Specialty: Home Health Services Why: HHPT arranged- they will contace you to schedule home visits post discharge Contact information: [place] [address] [phone]         [name], MD Follow up in 1 week(s).   Specialty: Family Medicine Why: you need follow up with PCP, readress need for lasix. Contact information: [place] [address] [phone]         Satira Sark, MD .   Specialty: Cardiology Contact information: [place] [address] [phone]  No Known Allergies  Consultations: GI Hematology    Procedures/Studies: DG Chest 2 View  Result Date: 07/15/2021 CLINICAL DATA:  Fever for a few days, diarrhea for 3 weeks EXAM: CHEST - 2 VIEW COMPARISON:  Chest radiograph 01/22/2020 FINDINGS: The cardiomediastinal silhouette is stable, with unchanged calcified atherosclerotic plaque of the aortic arch. There are patchy opacities projecting over the left mid lung on the frontal projection and over the left lower lobe on the lateral projection, not seen on the prior study. There is no pleural effusion or pneumothorax. A small hiatal hernia is again seen. The bones are unremarkable. IMPRESSION: Patchy opacities in the left lower lobe suspicious for pneumonia in the correct clinical setting. Recommend follow-up radiographs in 6-8 weeks to assess for resolution. Electronically Signed   By: Valetta Mole M.D.   On: 07/15/2021 13:13   MR Abdomen W Wo  Contrast  Result Date: 07/03/2021 CLINICAL DATA:  Restaging hepatocellular carcinoma. EXAM: MRI ABDOMEN WITHOUT AND WITH CONTRAST TECHNIQUE: Multiplanar multisequence MR imaging of the abdomen was performed both before and after the administration of intravenous contrast. CONTRAST:  59m GADAVIST GADOBUTROL 1 MMOL/ML IV SOLN COMPARISON:  MRI 03/23/2021 FINDINGS: Lower chest: No acute findings. Hepatobiliary: The liver has a cirrhotic appearance. Treated lesion within segment 4A is again seen. On today's study this measures 2.1 x 1.7 cm, image 23/17. Formally 2.8 x 2.2 cm. Persistent rim arterial hyperenhancement with diminished internal enhancement. Surrounding fibrosis with volume loss is again noted. The arterial phase enhancing lesion within lateral segment of left hepatic lobe measures 1.6 x 1.5 cm, image 30/17. Unchanged in size from previous exam. This has a pseudo capsule on the portal venous phase images with delayed internal washout compatible with LR-5 lesion. Status post cholecystectomy. Postoperative bile duct dilatation is again noted. Pancreas: No mass, inflammatory changes, or other parenchymal abnormality identified. Spleen: Spleen is enlarged measuring 15 cm in cranial caudal dimension. Formally this measured the same. No focal splenic abnormality. Adrenals/Urinary Tract: Normal adrenal glands. Bilateral renal cortical scarring. Cyst noted within posterior cortex of interpolar right kidney measuring 1.2 cm. Stomach/Bowel: Visualized portions within the abdomen are unremarkable. Vascular/Lymphatic: Normal caliber abdominal aorta. No abdominal adenopathy. Other:  Interval increase in volume of abdominopelvic ascites. Musculoskeletal: No suspicious bone lesions identified. IMPRESSION: 1. Treated lesion within segment 4A is again noted. Mildly decreased in size in the interval. Persistent rim arterial phase hyperenhancement with diminished internal enhancement compatible with residual viable tumor. 2.  Unchanged size of LR-5 lesion within lateral segment of left hepatic lobe compatible with hepatocellular carcinoma. 3. Cirrhosis with splenomegaly. 4. Interval increase in volume of abdominopelvic ascites. Electronically Signed   By: TKerby MoorsM.D.   On: 07/03/2021 13:00   CT ABDOMEN PELVIS W CONTRAST  Result Date: 07/12/2021 CLINICAL DATA:  Worsening diarrhea with abdominal pain, initial encounter EXAM: CT ABDOMEN AND PELVIS WITH CONTRAST TECHNIQUE: Multidetector CT imaging of the abdomen and pelvis was performed using the standard protocol following bolus administration of intravenous contrast. CONTRAST:  1044mOMNIPAQUE IOHEXOL 350 MG/ML SOLN COMPARISON:  07/01/2021 MRI FINDINGS: Lower chest: Lung bases are free of acute infiltrate or sizable effusion. Herniation of some omental fat is noted through the esophageal hiatus. No definitive herniation of the stomach is seen. Hepatobiliary: Liver is well visualized. Postsurgical changes are noted. Fiducial markings are again noted. Prior treated lesion is again seen and stable. The left lobe lesion marked by the fiducial markers is less well visualized than that on recent MRI. No new  focal lesion is seen. Pancreas: Unremarkable. No pancreatic ductal dilatation or surrounding inflammatory changes. Spleen: Normal in size without focal abnormality. Adrenals/Urinary Tract: Adrenal glands are within normal limits. Kidneys demonstrate a normal enhancement pattern bilaterally. No renal calculi or obstructive changes are seen. Ureters are within normal limits. The bladder is decompressed. Stomach/Bowel: Diverticular change of the colon is noted without definitive diverticulitis. No obstructive or inflammatory changes are seen. The appendix is well visualized and within normal limits. Small bowel and stomach are unremarkable. Vascular/Lymphatic: Aortic atherosclerosis. No enlarged abdominal or pelvic lymph nodes. Reproductive: Prostate is unremarkable. Other: Slight  increase in ascites is noted when compared with the prior MRI. No free air is noted. Musculoskeletal: Degenerative changes of lumbar spine are noted. IMPRESSION: Known hepatic lesions appear stable from recent MRI. The left lobe lesion is less well visualized than on recent MRI. Slight increase in the degree of ascites when compare with the prior study. Diverticular change without definitive diverticulitis. Electronically Signed   By: Inez Catalina M.D.   On: 07/12/2021 23:48     Subjective: He is feeling well.   Discharge Exam: Vitals:   07/19/21 0811 07/19/21 1148  BP: 120/61 127/74  Pulse: 87 88  Resp: 18 16  Temp: 97.9 F (36.6 C) 97.9 F (36.6 C)  SpO2: 92% 94%     General: Pt is alert, awake, not in acute distress Cardiovascular: RRR, S1/S2 +, no rubs, no gallops Respiratory: CTA bilaterally, no wheezing, no rhonchi Abdominal: Soft, NT, ND, bowel sounds + Extremities: no edema, no cyanosis    The results of significant diagnostics from this hospitalization (including imaging, microbiology, ancillary and laboratory) are listed below for reference.     Microbiology: Recent Results (from the past 240 hour(s))  Resp Panel by RT-PCR (Flu A&B, Covid) Nasopharyngeal Swab     Status: None   Collection Time: 07/12/21 10:18 PM   Specimen: Nasopharyngeal Swab; Nasopharyngeal(NP) swabs in vial transport medium  Result Value Ref Range Status   SARS Coronavirus 2 by RT PCR NEGATIVE NEGATIVE Final    Comment: (NOTE) SARS-CoV-2 target nucleic acids are NOT DETECTED.  The SARS-CoV-2 RNA is generally detectable in upper respiratory specimens during the acute phase of infection. The lowest concentration of SARS-CoV-2 viral copies this assay can detect is 138 copies/mL. A negative result does not preclude SARS-Cov-2 infection and should not be used as the sole basis for treatment or other patient management decisions. A negative result may occur with  improper specimen  collection/handling, submission of specimen other than nasopharyngeal swab, presence of viral mutation(s) within the areas targeted by this assay, and inadequate number of viral copies(<138 copies/mL). A negative result must be combined with clinical observations, patient history, and epidemiological information. The expected result is Negative.  Fact Sheet for Patients:  EntrepreneurPulse.com.au  Fact Sheet for Healthcare Providers:  IncredibleEmployment.be  This test is no t yet approved or cleared by the [place] FDA and  has been authorized for detection and/or diagnosis of SARS-CoV-2 by FDA under an Emergency Use Authorization (EUA). This EUA will remain  in effect (meaning this test can be used) for the duration of the COVID-19 declaration under Section 564(b)(1) of the Act, 21 U.S.C.section 360bbb-3(b)(1), unless the authorization is terminated  or revoked sooner.       Influenza A by PCR NEGATIVE NEGATIVE Final   Influenza B by PCR NEGATIVE NEGATIVE Final    Comment: (NOTE) The Xpert Xpress SARS-CoV-2/FLU/RSV plus assay is intended as an aid in the  diagnosis of influenza from Nasopharyngeal swab specimens and should not be used as a sole basis for treatment. Nasal washings and aspirates are unacceptable for Xpert Xpress SARS-CoV-2/FLU/RSV testing.  Fact Sheet for Patients: EntrepreneurPulse.com.au  Fact Sheet for Healthcare Providers: IncredibleEmployment.be  This test is not yet approved or cleared by the [place] FDA and has been authorized for detection and/or diagnosis of SARS-CoV-2 by FDA under an Emergency Use Authorization (EUA). This EUA will remain in effect (meaning this test can be used) for the duration of the COVID-19 declaration under Section 564(b)(1) of the Act, 21 U.S.C. section 360bbb-3(b)(1), unless the authorization is terminated or revoked.  Performed at [facility], [place] [address] [place], [place] [address]   MRSA Next Gen by PCR, Nasal     Status: None   Collection Time: 07/16/21 10:51 AM   Specimen: Nasal Mucosa; Nasal Swab  Result Value Ref Range Status   MRSA by PCR Next Gen NOT DETECTED NOT DETECTED Final    Comment: (NOTE) The GeneXpert MRSA Assay (FDA approved for NASAL specimens only), is one component of a comprehensive MRSA colonization surveillance program. It is not intended to diagnose MRSA infection nor to guide or monitor treatment for MRSA infections. Test performance is not FDA approved in patients less than 64 years old. Performed at [facility], [place] [address] [place], [place] [address]   Urine Culture     Status: Abnormal   Collection Time: 07/17/21  7:45 AM   Specimen: Urine, Clean Catch  Result Value Ref Range Status   Specimen Description URINE, CLEAN CATCH  Final   Special Requests NONE  Final   Culture (A)  Final    <10,000 COLONIES/mL INSIGNIFICANT GROWTH Performed at [facility], [place] [address] [place], [place] [address]    Report Status 07/18/2021 FINAL  Final     Labs: BNP (last 3 results) No results for input(s): BNP in the last 8760 hours. Basic Metabolic Panel: Recent Labs  Lab 07/13/21 0318 07/14/21 1337 07/16/21 0122 07/17/21 0755 07/19/21 0011  NA 133* 137 134* 135 136  K 4.2 4.0 4.4 4.4 4.5  CL 104 106 105 105 104  CO2 _0 GLUCOSE 94 147* 146* 102* 117*  BUN _1 CREATININE 0.86 0.87 1.01 0.80 0.98  CALCIUM 8.4* 8.6* 8.4* 8.5* 8.7*  MG 1.9  --   --   --   --    Liver Function Tests: Recent Labs  Lab 07/13/21 0318  AST 27  ALT 13  ALKPHOS 82  BILITOT 1.1  PROT 5.3*  ALBUMIN 2.7*   No results for input(s): LIPASE, AMYLASE in the last 168 hours. No results for input(s): AMMONIA in the last 168 hours. CBC: Recent Labs  Lab 07/13/21 0318 07/15/21 1028 07/16/21 0122 07/17/21 0755 07/17/21 1103 07/18/21 0142 07/19/21 1158   WBC 3.9*   < > 2.6* 1.9* 2.2* 1.7* 7.8  NEUTROABS 2.6  --   --   --  1.6* 1.1* 6.4  HGB 9.7*   < > 8.0* 8.6* 8.9* 7.9* 9.2*  HCT 28.4*   < > 23.9* 25.7* 26.6* 24.0* 28.2*  MCV 102.9*   < > 104.8* 104.5* 105.1* 104.8* 106.4*  PLT 81*   < > 56* 56* 54* 56* 68*   < > = values in this interval not displayed.   Cardiac Enzymes: No results for input(s): CKTOTAL, CKMB, CKMBINDEX, TROPONINI in the last  168 hours. BNP: Invalid input(s): POCBNP CBG: Recent Labs  Lab 07/18/21 1242 07/18/21 1643 07/18/21 2053 07/19/21 0749 07/19/21 1148  GLUCAP 113* 135* 137* 109* 166*   D-Dimer No results for input(s): DDIMER in the last 72 hours. Hgb A1c No results for input(s): HGBA1C in the last 72 hours. Lipid Profile No results for input(s): CHOL, HDL, LDLCALC, TRIG, CHOLHDL, LDLDIRECT in the last 72 hours. Thyroid function studies No results for input(s): TSH, T4TOTAL, T3FREE, THYROIDAB in the last 72 hours.  Invalid input(s): FREET3 Anemia work up No results for input(s): VITAMINB12, FOLATE, FERRITIN, TIBC, IRON, RETICCTPCT in the last 72 hours. Urinalysis    Component Value Date/Time   COLORURINE AMBER (A) 07/15/2021 1355   APPEARANCEUR HAZY (A) 07/15/2021 1355   LABSPEC 1.027 07/15/2021 1355   PHURINE 5.0 07/15/2021 1355   GLUCOSEU NEGATIVE 07/15/2021 1355   GLUCOSEU NEGATIVE 10/22/2018 1451   HGBUR NEGATIVE 07/15/2021 1355   HGBUR negative 05/14/2008 1032   BILIRUBINUR NEGATIVE 07/15/2021 1355   BILIRUBINUR neg 05/15/2017 1330   KETONESUR NEGATIVE 07/15/2021 1355   PROTEINUR 30 (A) 07/15/2021 1355   UROBILINOGEN 0.2 10/22/2018 1451   NITRITE POSITIVE (A) 07/15/2021 1355   LEUKOCYTESUR LARGE (A) 07/15/2021 1355   Sepsis Labs Invalid input(s): PROCALCITONIN,  WBC,  LACTICIDVEN Microbiology Recent Results (from the past 240 hour(s))  Resp Panel by RT-PCR (Flu A&B, Covid) Nasopharyngeal Swab     Status: None   Collection Time: 07/12/21 10:18 PM   Specimen: Nasopharyngeal Swab;  Nasopharyngeal(NP) swabs in vial transport medium  Result Value Ref Range Status   SARS Coronavirus 2 by RT PCR NEGATIVE NEGATIVE Final    Comment: (NOTE) SARS-CoV-2 target nucleic acids are NOT DETECTED.  The SARS-CoV-2 RNA is generally detectable in upper respiratory specimens during the acute phase of infection. The lowest concentration of SARS-CoV-2 viral copies this assay can detect is 138 copies/mL. A negative result does not preclude SARS-Cov-2 infection and should not be used as the sole basis for treatment or other patient management decisions. A negative result may occur with  improper specimen collection/handling, submission of specimen other than nasopharyngeal swab, presence of viral mutation(s) within the areas targeted by this assay, and inadequate number of viral copies(<138 copies/mL). A negative result must be combined with clinical observations, patient history, and epidemiological information. The expected result is Negative.  Fact Sheet for Patients:  EntrepreneurPulse.com.au  Fact Sheet for Healthcare Providers:  IncredibleEmployment.be  This test is no t yet approved or cleared by the [place] FDA and  has been authorized for detection and/or diagnosis of SARS-CoV-2 by FDA under an Emergency Use Authorization (EUA). This EUA will remain  in effect (meaning this test can be used) for the duration of the COVID-19 declaration under Section 564(b)(1) of the Act, 21 U.S.C.section 360bbb-3(b)(1), unless the authorization is terminated  or revoked sooner.       Influenza A by PCR NEGATIVE NEGATIVE Final   Influenza B by PCR NEGATIVE NEGATIVE Final    Comment: (NOTE) The Xpert Xpress SARS-CoV-2/FLU/RSV plus assay is intended as an aid in the diagnosis of influenza from Nasopharyngeal swab specimens and should not be used as a sole basis for treatment. Nasal washings and aspirates are unacceptable for Xpert Xpress  SARS-CoV-2/FLU/RSV testing.  Fact Sheet for Patients: EntrepreneurPulse.com.au  Fact Sheet for Healthcare Providers: IncredibleEmployment.be  This test is not yet approved or cleared by the [place] FDA and has been authorized for detection and/or diagnosis of SARS-CoV-2 by FDA under an  Emergency Use Authorization (EUA). This EUA will remain in effect (meaning this test can be used) for the duration of the COVID-19 declaration under Section 564(b)(1) of the Act, 21 U.S.C. section 360bbb-3(b)(1), unless the authorization is terminated or revoked.  Performed at [facility], [place] [address] [place], [place] [address]   MRSA Next Gen by PCR, Nasal     Status: None   Collection Time: 07/16/21 10:51 AM   Specimen: Nasal Mucosa; Nasal Swab  Result Value Ref Range Status   MRSA by PCR Next Gen NOT DETECTED NOT DETECTED Final    Comment: (NOTE) The GeneXpert MRSA Assay (FDA approved for NASAL specimens only), is one component of a comprehensive MRSA colonization surveillance program. It is not intended to diagnose MRSA infection nor to guide or monitor treatment for MRSA infections. Test performance is not FDA approved in patients less than 47 years old. Performed at [facility], [place] [address] [place], [place] [address]   Urine Culture     Status: Abnormal   Collection Time: 07/17/21  7:45 AM   Specimen: Urine, Clean Catch  Result Value Ref Range Status   Specimen Description URINE, CLEAN CATCH  Final   Special Requests NONE  Final   Culture (A)  Final    <10,000 COLONIES/mL INSIGNIFICANT GROWTH Performed at [facility], [place] [address], [place], [place] [address]    Report Status 07/18/2021 FINAL  Final     Time coordinating discharge: 40 minutes  SIGNED:   Elmarie Shiley, MD  [facility]

## 2021-07-20 ENCOUNTER — Telehealth: Payer: Self-pay

## 2021-07-20 NOTE — Telephone Encounter (Signed)
.  Type of forms received:FMLA    Routed SL:HTDSKA [employer]care    Paperwork received by : Jinny Blossom    Individual made aware of 3-5 business day turn around (Y/N):YES   Form completed and patient made aware of charges(Y/N):   Faxed to : Benita Gutter [phone]  Form location:  Placed in hunters box

## 2021-07-22 ENCOUNTER — Telehealth: Payer: Self-pay

## 2021-07-22 ENCOUNTER — Telehealth: Payer: Self-pay | Admitting: Pharmacist

## 2021-07-22 NOTE — Telephone Encounter (Signed)
Noted thanks °

## 2021-07-22 NOTE — Chronic Care Management (AMB) (Signed)
Chronic Care Management Pharmacy Assistant   Name: [name]  MRN: [id number] DOB: 1940/12/23   Reason for Encounter: Diabetes Adherence Call    Recent office visits:  None  Recent consult visits:  07/06/2021 (oncology) Toma Copier, MD; no medication changes indicated.  07/05/2021 OV (cardiology) Satira Sark, MD; no medication changes indicated.  06/25/2021 OV (podiatry) Marzetta Board, DPM; No new findings, no new orders  Hospital visits:  07/12/2021 ED to Hospital Admission for Dehydration, Fever and Generalized Weakness Admit date: 07/12/2021 Discharge date: 07/19/2021   Admitted From: Home  Disposition:  Home    Recommendations for Outpatient Follow-up:  Follow up with PCP in 1-2 weeks Please obtain BMP/CBC in one week Follow up bx result with GI.  Follow WBC.  Consider resuming lasix if BP allows it and patient is not having worsening diarrhea.  STOP taking these medications: Acetaminophen 500 mg Acyclovir 400 mg Clotrimazole-betamethasone cream Famotidine 20 mg Furosemide 20 mg Metformin 500 mg Mupirocin ointment Suprep Bowel Prep Kit  Changed: Carvedilol 6.25 mg twice daily (dose increase) Buspirone 7.5 mg two times daily as needed for anxiety    Medications: Outpatient Encounter Medications as of 07/22/2021  Medication Sig   albuterol (VENTOLIN HFA) 108 (90 Base) MCG/ACT inhaler Inhale 2 puffs into the lungs every 6 (six) hours as needed for wheezing or shortness of breath.   aspirin EC 81 MG tablet Take 81 mg by mouth daily.   Blood Glucose Monitoring Suppl (TRUE METRIX AIR GLUCOSE METER) w/Device KIT    busPIRone (BUSPAR) 7.5 MG tablet Take 1 tablet (7.5 mg total) by mouth 2 (two) times daily as needed. for anxiety (Patient taking differently: Take 7.5 mg by mouth at bedtime.)   carvedilol (COREG) 6.25 MG tablet TAKE 1 TABLET TWICE DAILY (DOSE INCREASE) (Patient taking differently: Take 6.25 mg by mouth 2 (two) times  daily with a meal.)   ENTRESTO 49-51 MG TAKE 1 TABLET TWICE DAILY (Patient taking differently: Take 1 tablet by mouth 2 (two) times daily.)   escitalopram (LEXAPRO) 5 MG tablet Take 1 tablet (5 mg total) by mouth daily.   finasteride (PROSCAR) 5 MG tablet TAKE 1 TABLET EVERY DAY (Patient taking differently: Take 5 mg by mouth daily.)   gabapentin (NEURONTIN) 300 MG capsule TAKE 1 CAPSULE TWICE DAILY (Patient taking differently: Take 300 mg by mouth 2 (two) times daily.)   glucose blood (TRUE METRIX BLOOD GLUCOSE TEST) test strip TEST BLOOD SUGAR EVERY DAY   loperamide (IMODIUM) 2 MG capsule Take 1 capsule (2 mg total) by mouth 3 (three) times daily between meals as needed for diarrhea or loose stools.   Multiple Vitamin (MULTIVITAMIN WITH MINERALS) TABS tablet Take 1 tablet by mouth daily.   Polyvinyl Alcohol-Povidone (REFRESH OP) Place 1 drop into both eyes at bedtime.   rosuvastatin (CRESTOR) 10 MG tablet TAKE 1 TABLET EVERY DAY (Patient taking differently: Take 10 mg by mouth at bedtime.)   tamsulosin (FLOMAX) 0.4 MG CAPS capsule TAKE 1 CAPSULE EVERY DAY (Patient taking differently: Take 0.4 mg by mouth daily.)   TRUEPLUS LANCETS 28G MISC Use to test blood sugars two times daily. Dx: E11.9   No facility-administered encounter medications on file as of 07/22/2021.   Recent Relevant Labs: Lab Results  Component Value Date/Time   HGBA1C 5.2 07/13/2021 03:18 AM   HGBA1C 6.5 (A) 12/15/2020 02:54 PM   HGBA1C 5.9 (A) 02/25/2020 01:37 PM   HGBA1C 7.1 (H) 10/22/2018 02:51 PM   MICROALBUR  60.3 (H) 01/29/2018 12:52 PM   MICROALBUR 7.8 (H) 12/01/2015 09:40 AM    Kidney Function Lab Results  Component Value Date/Time   CREATININE 0.98 07/19/2021 12:11 AM   CREATININE 0.80 07/17/2021 07:55 AM   CREATININE 0.98 07/06/2021 01:19 PM   CREATININE 1.15 04/06/2021 08:57 AM   GFR 66.66 02/05/2020 02:04 PM   GFRNONAA >60 07/19/2021 12:11 AM   GFRNONAA >60 07/06/2021 01:19 PM   GFRAA 59 (L) 06/09/2020  08:59 AM    Current antihyperglycemic regimen:  None at the moment  What recent interventions/DTPs have been made to improve glycemic control:  Patient states he was recently discontinued from Metformin.  Have there been any recent hospitalizations or ED visits since last visit with CPP? Yes  Patient denies hypoglycemic symptoms.  Patient denies hyperglycemic symptoms.  How often are you checking your blood sugar? None - he needs a new glucometer.  What are your blood sugars ranging?  Fasting: n/a Before meals: n/a After meals: n/a Bedtime: n/a  During the week, how often does your blood glucose drop below 70? Never  Are you checking your feet daily/regularly? Patient states he goes to see a podiatrist regularly for a foot ulcer.  Adherence Review: Is the patient currently on a STATIN medication? Yes Is the patient currently on ACE/ARB medication? Yes Does the patient have >5 day gap between last estimated fill dates? No  Patient states his glucose meter stopped working and needs a new one. He would like it sent to his Mail Order pharmacy. - I sent a message to [facility] Rx Refill.   Care Gaps: Medicare Annual Wellness: Completed Ophthalmology Exam: Overdue since 01/25/2019 - patient states he recently had an eye exam ~ 6 months ago. Foot Exam: Next due on 05/14/2022 Hemoglobin A1C: 6.5% on 12/15/2020 Colonoscopy: Completed on 07/14/2021  Future Appointments  Date Time Provider Ryan  07/28/2021 11:20 AM [name], MD [facility]  08/05/2021  3:00 PM Pyrtle, Lajuan Lines, MD [facility]  08/09/2021  1:00 PM [facility] HEALTH COACH [facility]  09/14/2021  1:40 PM [name], MD [facility]  09/20/2021 10:45 AM Marzetta Board, DPM [facility]  10/13/2021  9:30 AM [facility] None  10/13/2021 10:00 AM Brunetta Genera, MD [facility] None    Star Rating Drugs: Rosuvastatin 10 mg last filled 05/22/2021 90  DS Metformin 500 mg last filled 05/28/2021 90 DS Entresto 49-51 mg last filled 04/22/2021 90 DS  April D Calhoun, [place] Pharmacist Assistant [phone]

## 2021-07-22 NOTE — Telephone Encounter (Cosign Needed)
Transition Care Management Unsuccessful Follow-up Telephone Call  Date of discharge and from where:  [place] [facility] 07/19/21   Attempts:  1st Attempt  Reason for unsuccessful TCM follow-up call:  Left voice message

## 2021-07-22 NOTE — Telephone Encounter (Cosign Needed)
Transition Care Management Follow-up Telephone Call Date of discharge and from where: [place] [facility] 07/19/21 How have you been since you were released from the hospital? Getting better everyday  Any questions or concerns? No  Items Reviewed: Did the pt receive and understand the discharge instructions provided? Yes  Medications obtained and verified? Yes  Other? No  Any new allergies since your discharge? No  Dietary orders reviewed? Yes Do you have support at home? Yes   Home Care and Equipment/Supplies: Were home health services ordered? not applicable If so, what is the name of the agency?   Has the agency set up a time to come to the patient's home? not applicable Were any new equipment or medical supplies ordered?  No What is the name of the medical supply agency?  Were you able to get the supplies/equipment? not applicable Do you have any questions related to the use of the equipment or supplies? No  Functional Questionnaire: (I = Independent and D = Dependent) ADLs: I  Bathing/Dressing- I  Meal Prep- I  Eating- I  Maintaining continence- I  Transferring/Ambulation- I  Managing Meds- I WIFE ASSIST WITH NEEDS   Follow up appointments reviewed:  PCP Hospital f/u appt confirmed? Yes  Scheduled to see Dr Yong Channel  on 07/28/21 @ 11:20. [facility] [facility] f/u appt confirmed? Yes  Scheduled to see Dr Raquel James  on 08/05/21 @ 3:00. Are transportation arrangements needed? No  If their condition worsens, is the pt aware to call PCP or go to the Emergency Dept.? Yes Was the patient provided with contact information for the PCP's office or ED? Yes Was to pt encouraged to call back with questions or concerns? Yes

## 2021-07-25 DIAGNOSIS — J449 Chronic obstructive pulmonary disease, unspecified: Secondary | ICD-10-CM | POA: Diagnosis not present

## 2021-07-25 DIAGNOSIS — J9611 Chronic respiratory failure with hypoxia: Secondary | ICD-10-CM | POA: Diagnosis not present

## 2021-07-26 ENCOUNTER — Telehealth: Payer: Self-pay

## 2021-07-26 ENCOUNTER — Other Ambulatory Visit: Payer: Self-pay

## 2021-07-26 MED ORDER — TRUE METRIX AIR GLUCOSE METER W/DEVICE KIT: PACK | 3 refills | Status: DC

## 2021-07-26 NOTE — Progress Notes (Signed)
Phone [phone]   Subjective:  [name] is a 80 y.o. year old very pleasant male patient who presents for transitional care management and hospital follow up for watery diarrhea. Patient was hospitalized from 10/03/2 to 07/19/21. A TCM phone call was completed on 07/22/2021. 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[place]) 12/14/2019    Priority: 1.   Chronic systolic heart failure ([place]) 12/05/2019    Priority: 1.   Thrombocytopenia ([place]) 12/21/2015    Priority: 1.   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   ILD (interstitial lung disease) ([place]) 07/05/2012    Priority: 1.   COPD (chronic obstructive pulmonary disease) ([place]) 12/15/2009    Priority: 1.   Chronic back pain. Off narcotics 06/05/17 due to negative UDS for opiates x2. Full history 06/12/14. Pain contract signed.  05/02/2007    Priority: 1.   DM (diabetes mellitus) type II controlled, neurological manifestation ([place]) 04/06/2007    Priority: 1.   Anxiety 02/25/2020    Priority: 2.   B12 deficiency 08/20/2015    Priority: 2.   Diabetic polyneuropathy associated with type 2 diabetes mellitus ([place]) 08/07/2015    Priority: 2.   CKD (chronic kidney disease), stage III ([place]) 05/06/2015    Priority: 2.   Fatty liver 11/04/2014    Priority: 2.   BPH (benign prostatic hyperplasia) 06/20/2007    Priority: 2.   Depression 05/02/2007    Priority: 2.   Hyperlipidemia associated with type 2 diabetes mellitus ([place]) 04/06/2007    Priority: 2.   Essential hypertension 04/06/2007    Priority: 2.   Osteoarthritis 04/06/2007    Priority: 2.   Iron deficiency anemia due to chronic blood loss 04/19/2021    Priority: 3.   Aortic atherosclerosis ([place]) 12/14/2019    Priority: 3.   GERD (gastroesophageal reflux disease)  09/30/2017    Priority: 3.   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 positive 03/29/2016    Priority: 3.   Diplopia 06/05/2015    Priority: 3.   Candidal balanoposthitis 06/20/2014    Priority: 3.   PCO (posterior capsular opacification) 06/19/2013    Priority: 3.   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   Pseudophakia of left eye 04/10/2013    Priority: 3.   Nuclear cataract 01/20/2012    Priority: 3.   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   Malnutrition of moderate degree 07/14/2021   Cirrhosis of liver without ascites (HCC)    Mucosal abnormality of stomach    Portal hypertensive gastropathy (HCC)    Secondary esophageal varices without bleeding (HCC)    Generalized weakness 07/13/2021   Chronic diarrhea 07/13/2021   Diarrhea due to malabsorption 07/13/2021   Anemia 03/05/2018    Medications- reviewed and updated  A medical reconciliation was performed comparing current medicines to hospital discharge medications. Current Outpatient Medications  Medication Sig Dispense Refill   albuterol (VENTOLIN HFA) 108 (90 Base) MCG/ACT inhaler Inhale 2 puffs into the lungs every 6 (six) hours as needed for wheezing or shortness of breath. 6.7 g 3   aspirin EC 81 MG tablet Take 81 mg by mouth daily.     blood glucose meter kit and supplies KIT Dispense based on patient and insurance preference.  Use up to four times daily as directed. 1 each 0   Blood Glucose Monitoring Suppl (TRUE METRIX AIR GLUCOSE METER) w/Device KIT Use to check blood sugars daily. Dx: E11.9 1 kit 3   busPIRone (BUSPAR) 7.5 MG tablet Take 1 tablet (7.5 mg total) by mouth 2 (two) times daily as needed. for anxiety (Patient taking differently: Take 7.5 mg by mouth at bedtime.) 180 tablet 3   carvedilol (COREG) 6.25 MG tablet TAKE 1 TABLET TWICE DAILY (DOSE INCREASE) (Patient taking differently: Take 6.25 mg by mouth 2 (two) times daily with a meal.) 180 tablet 3   ENTRESTO 49-51 MG TAKE 1 TABLET  TWICE DAILY (Patient taking differently: Take 1 tablet by mouth 2 (two) times daily.) 180 tablet 3   escitalopram (LEXAPRO) 5 MG tablet Take 1 tablet (5 mg total) by mouth daily. 90 tablet 3   gabapentin (NEURONTIN) 300 MG capsule TAKE 1 CAPSULE TWICE DAILY (Patient taking differently: Take 300 mg by mouth 2 (two) times daily.) 180 capsule 3   glucose blood (TRUE METRIX BLOOD GLUCOSE TEST) test strip TEST BLOOD SUGAR EVERY DAY 100 strip 1   loperamide (IMODIUM) 2 MG capsule Take 1 capsule (2 mg total) by mouth 3 (three) times daily between meals as needed for diarrhea or loose stools. 30 capsule 0   Multiple Vitamin (MULTIVITAMIN WITH MINERALS) TABS tablet Take 1 tablet by mouth daily. 30 tablet 0   Polyvinyl Alcohol-Povidone (REFRESH OP) Place 1 drop into both eyes at bedtime.     tamsulosin (FLOMAX) 0.4 MG CAPS capsule TAKE 1 CAPSULE EVERY DAY (Patient taking differently: Take 0.4 mg by mouth daily.) 90 capsule 1   traMADol (ULTRAM) 50 MG tablet Take 1 tablet (50 mg total) by mouth 2 (two) times daily as needed for up to 5 days for moderate pain (for back pain. do not take tylenol due to your liver history.). 60 tablet 2   TRUEPLUS LANCETS 28G MISC Use to test blood sugars two times daily. Dx: E11.9 200 each 3   finasteride (PROSCAR) 5 MG tablet TAKE 1 TABLET EVERY DAY (Patient not taking: Reported on 07/28/2021) 90 tablet 1   rosuvastatin (CRESTOR) 10 MG tablet TAKE 1 TABLET EVERY DAY 90 tablet 3   No current facility-administered medications for this visit.   Objective  Objective:  BP 100/66   Pulse 82   Temp 98.2 F (36.8 C) (Temporal)   Ht '5\' 11"'  (1.803 m)   Wt 206 lb 6.4 oz (93.6 kg)   SpO2 96%   BMI 28.79 kg/m  Gen: NAD, resting comfortably CV: RRR no murmurs rubs or gallops Lungs: CTAB no crackles, wheeze, rhonchi Abdomen: soft/nontender/nondistended/normal bowel sounds.  Protuberant abdomen about at baseline Ext: Trace to 1+ edema Skin: warm, dry    Assessment and Plan:    # Hospital f/u for diarrhea, pneumonia, UTI S:presented on 07/12/21 for over 1 month of watery diarrhea. He also reported generalized weakness related to this-poor oral intake and acute illness. Recent C. difficile and GI pathogen negative.  Patient required CT of the abdomen-known hepatic lesion appeared stable from prior MRI-ascites appeared increased.  Patient did have both endoscopy and colonoscopy-I did biopsy off of a flat lesion in the stomach , also had biopsy To evaluate for microscopic colitis - overall reassuring biopsies. Biopsy sites including duodenum, flat lesion in the stomach, separate lesion in the stomach and a random colon biopsy-we reviewed biopsies together but discussed formal report should come from GI patient received physical therapy  in the hospital and is receiving home health physical therapy as well to improve strength- this was planned but he has not seen them or been contacted by them yet- he is open to new referral today   Patient with possible pneumonia and UA initially with greater than 50 WBC-treated with IV ceftriaxone and azithromycin for 3 days-he is afebrile for 48 hours prior to discharge- no SOB above baseline at discharge or since that time.   Ultimately urine culture with insignificant growth  Patient did have pancytopenia-thought to be a combination of infection, acute illness, cirrhosis.  Hematology was consulted-Dr. Ennever-received Granix-2 BC increased to 7 before discharge. Antibiotics were stopped- was thought they could be lower WBC  Today reports Diarrhea finally clearing- 2 months of symptoms Appetite still very low  after hospitalization- very little improvement, doing a protein drink- discussed could do diabetes friendly option  -metformin was stopped- I agree with holding for now Weight up 3 lbs from last visit here Still dealing with low energy A/P: Chronic diarrhea leading to generalized weakness with concern also for pneumonia and UTI  leading to hospitalization earlier this month -Patient reports continued weakness above baseline-he is in a wheelchair today.  Reports home health was supposed to be set up but he has not received any information yet-in order to coordinate care we reentered home health referral and he knows we are available to help if he does not hear within the next few days -In regards to UTI final culture with insignificant growth and no current symptoms-if recurrent symptoms he should reach out to [place] - In regards to pneumonia completed short course of antibiotics in the hospital-we discussed repeating chest x-ray at follow-up visit at least 6 weeks out from original diagnosis of pneumonia-he is in agreement at follow-up visit -Patient still with decreased appetite and overall fatigue-we will update labs but I suspect this is related to hospitalization and chronicity of diarrhea-glad the diarrhea is gone and hoping this will slowly improve -No increased edema on exam and he does not report increased abdominal girth-with lower appetite and possible slight weight gain-we will continue to monitor this closely  In regards to pancytopenia-white count have improved before discharge-we will update with labs today.  Has been seen by hematology in the past-likely back to factorial including cirrhosis, recent infection   #Hepatocellular carcinoma-followed with Dr. Irene Limbo #Cirrhosis with ascites S: Not a surgical candidate. Patient has undergone radiation treatment.  On most recent MRI of the abdomen 1 area had shown significantly another area had increased slightly and was concerned for liver cancer-referral was made back to radiation oncology and patient has been treated with radiation therapy-last seen oncology a month ago with plan for 61-monthfollow-up at that time with MRI of abdomen before visit -Was also referred to GI for anemia from likely GI bleed-he is also being worked up for diarrhea before hospitalization A/P:  Hepatocellular carcinoma appears stable after treatment by radiation oncology -Patient with some ascites noted on imaging-reports possible aspiration was considered in the hospital but they opted out-no worsening abdominal girth-as noted above I wonder if some of his weight gain is related to this though-we will need to watch weight closely as well as abdominal girth-given hepatocellular carcinoma less likely to intervene -Patient reports another scan planned in January  #ILD/COPD S: Patient carried a diagnosis of COPD due to former smoking. Also previously diagnosed as having interstitial lung disease by pulmonology. He does not follow-up with them anymore. Fortunately breathing issues had  not progressed. -Patient had to use albuterol 1-3x x per day. has oxygen at home - uses some nights if felt SOB A/P: Stable baseline shortness of breath-continue to monitor with albuterol alone  # Diabetes S: off meds right now Prior to hospitalization- metformin 104m in AM and 5042min the evening CBGs-needs a new meter Lab Results  Component Value Date   HGBA1C 5.2 07/13/2021   HGBA1C 6.5 (A) 12/15/2020   HGBA1C 5.9 (A) 02/25/2020    A/P: A1c was low during hospitalization-previously on metformin-this was discontinued with ongoing diarrhea.  We opted for 3-58-monthllow-up and recheck A1c at that time and consider our options  #/Congestive heart failure with EF 20 to 25% at low- most recently 50-55%/hypertension/CKD stage III -Chronic kidney disease has been stable with GFR in the 50s-several readings in 2019 and 2020 with GFR at 60 or above - most recently in 50s65s: Post heart failure hospitalization patient compliant with carvedilol 6.25 mg twice daily, Entresto 49-51 mg twice a day started by cardiology. - Prior to heart failure: amlodipine 70m14misinopril 2.5mg 71m lasix 20 mg   Edema: trace to 1+  Weight gain: weight up slightly Shortness of breath: stable  BP Readings from Last 3  Encounters:  07/28/21 100/66  07/19/21 127/74  07/06/21 126/60  A/P: Blood pressure well controlled.  I am slightly concerned about 3 pound weight gain in regards to CHF but we will monitor for now.  CKD stage III-we will monitor with CMP- last few checks actually better  #hyperlipidemia S: compliant with rosuvastatin 70mg 94my day. LDL goal under 70 Lab Results  Component Value Date   CHOL 120 11/07/2019   HDL 45.60 11/07/2019   LDLCALC 49 11/07/2019   LDLDIRECT 57.0 10/22/2018   TRIG 126.0 11/07/2019   CHOLHDL 3 11/07/2019   A/P: Reports has been out of medication for about 2 months-we will refill this-is due for rechecking cholesterol but we will wait on this until he has been back on medicine for at least 6 weeks-we will do that at next visit  # GERD S:compliant with omeprazole 20mg i45mst- not taking at the time.could have accounted for some of endoscopy findings A/P: No recent need for PPI/controlled without meds-continue to monitor  # BPH S:compliant with flomax 0.4mg eve11mday and finasteride 5mg ever69may. Rare nocturia with this combo A/P:  Controlled. Continue current medications.    #Chronic low back pain/diabetic neuropathy S: Used to be on narcotics but UDS's were negative for narcotics x2 and these were discontinued. Patient was compliant with gabapentin-he found helpful. off amitriptyline  Tolerating 5-6 back pain- but taking 10 500mg a da99meeds to stop asap with liver history A/P: Patient has been taking way too much Tylenol especially with his liver history-recommended discontinuing this.  In the past had been on narcotics but UDS was negative for narcotics repeatedly.  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-has follow-up early January-we will check CBC today  # Depression Well controlled with Lexapro 5 mg daily started in 2021 also on buspirone- reasonable control with this  -Off amitriptyline due to fall risk  and possible memory concerning A/P: phq9 over 5- patient states a lot of situational stress with healthcare costs (entresto) and recently being in hospital- does not want to make any changes at present but we can recheck at 3 month visit- he will let us know if worsening symptoms and if any thoughts of self harm should call us or 911  Recommended follow up: Return in about 3 months (around 10/28/2021) for follow-up or sooner if needed. Future Appointments  Date Time Provider [place]  08/05/2021  3:00 PM Pyrtle, Lajuan Lines, MD [facility]  08/09/2021  1:00 PM [facility] HEALTH COACH [facility]  09/14/2021  1:40 PM [name], MD [facility]  09/20/2021 10:45 AM Marzetta Board, DPM [facility]  10/13/2021  9:30 AM [facility] None  10/13/2021 10:00 AM Brunetta Genera, MD [facility] None  11/01/2021  3:40 PM Yong Channel, Brayton Mars, MD [facility]    Lab/Order associations:   ICD-10-CM   1. General weakness  R53.1 Ambulatory referral to Home Health    2. Hepatocellular carcinoma ([place])  C22.0     3. Controlled type 2 diabetes mellitus with diabetic polyneuropathy, without long-term current use of insulin (HCC)  E11.42     4. Congestive heart failure, unspecified HF chronicity, unspecified heart failure type ([place])  I50.9     5. Thrombocytopenia ([place])  D69.6     6. Essential hypertension  I10 CBC with Differential/Platelet    Comprehensive metabolic panel    7. Hyperlipidemia, unspecified hyperlipidemia type  E78.5 TSH    rosuvastatin (CRESTOR) 10 MG tablet    8. Stage 3 chronic kidney disease, unspecified  whether stage 3a or 3b CKD (HCC)  N18.30     9. Gastroesophageal reflux disease without esophagitis  K21.9     10. Benign prostatic hyperplasia with lower urinary tract symptoms, symptom details unspecified  N40.1     11. Recurrent major depressive disorder, in full remission ([place])  F33.42       Meds ordered this encounter  Medications   rosuvastatin (CRESTOR) 10 MG tablet    Sig: TAKE 1 TABLET EVERY DAY    Dispense:  90 tablet    Refill:  3   traMADol (ULTRAM) 50 MG tablet    Sig: Take 1 tablet (50 mg total) by mouth 2 (two) times daily as needed for up to 5 days for moderate pain (for back pain. do not take tylenol due to your liver history.).    Dispense:  60 tablet    Refill:  2   blood glucose meter kit and supplies KIT    Sig: Dispense based on patient and insurance preference. Use up to four times daily as directed.    Dispense:  1 each    Refill:  0    Match to his insurance    Order Specific Question:   Number of strips    Answer:   100    Order Specific Question:   Number of lancets    Answer:   100    Time Spent: 55 minutes of total time (11:50 PM-12:25 PM, 1:40 PM-2:00 PM ) was spent on the date of the encounter performing the following actions: chart review prior to seeing the patient, obtaining history, performing a medically necessary exam, counseling on the treatment plan, placing orders, and documenting in our EHR.    I,Jada Bradford,acting as a scribe for [name], MD.,have documented all relevant documentation on the behalf of [name], MD,as directed  by  [name], MD while in the presence of [name], MD.   I, [name], MD, have reviewed all documentation for this visit. The documentation on 07/28/21 for the exam, diagnosis, procedures, and orders are all accurate and complete.   Return precautions advised.  [name], MD

## 2021-07-26 NOTE — Telephone Encounter (Signed)
Glucometer sent to pharmacy.

## 2021-07-26 NOTE — Telephone Encounter (Signed)
-----   Message from April D [facility] sent at 07/23/2021  8:10 AM EDT ----- Regarding: Glucometer Patient would like an Rx for a new glucometer sent to his mail order pharmacy.  Thank you,  April D [facility], [place] Pharmacist Assistant [phone]

## 2021-07-28 ENCOUNTER — Ambulatory Visit (INDEPENDENT_AMBULATORY_CARE_PROVIDER_SITE_OTHER): Payer: Medicare HMO | Admitting: Family Medicine

## 2021-07-28 ENCOUNTER — Other Ambulatory Visit: Payer: Self-pay

## 2021-07-28 ENCOUNTER — Encounter: Payer: Self-pay | Admitting: Family Medicine

## 2021-07-28 VITALS — BP 100/66 | HR 82 | Temp 98.2°F | Ht 71.0 in | Wt 206.4 lb

## 2021-07-28 DIAGNOSIS — I509 Heart failure, unspecified: Secondary | ICD-10-CM | POA: Diagnosis not present

## 2021-07-28 DIAGNOSIS — R531 Weakness: Secondary | ICD-10-CM | POA: Diagnosis not present

## 2021-07-28 DIAGNOSIS — N183 Chronic kidney disease, stage 3 unspecified: Secondary | ICD-10-CM

## 2021-07-28 DIAGNOSIS — C220 Liver cell carcinoma: Secondary | ICD-10-CM | POA: Diagnosis not present

## 2021-07-28 DIAGNOSIS — K219 Gastro-esophageal reflux disease without esophagitis: Secondary | ICD-10-CM | POA: Diagnosis not present

## 2021-07-28 DIAGNOSIS — E785 Hyperlipidemia, unspecified: Secondary | ICD-10-CM | POA: Diagnosis not present

## 2021-07-28 DIAGNOSIS — E1142 Type 2 diabetes mellitus with diabetic polyneuropathy: Secondary | ICD-10-CM

## 2021-07-28 DIAGNOSIS — F3342 Major depressive disorder, recurrent, in full remission: Secondary | ICD-10-CM

## 2021-07-28 DIAGNOSIS — D696 Thrombocytopenia, unspecified: Secondary | ICD-10-CM | POA: Diagnosis not present

## 2021-07-28 DIAGNOSIS — I10 Essential (primary) hypertension: Secondary | ICD-10-CM

## 2021-07-28 DIAGNOSIS — N401 Enlarged prostate with lower urinary tract symptoms: Secondary | ICD-10-CM

## 2021-07-28 LAB — COMPREHENSIVE METABOLIC PANEL
ALT: 15 U/L (ref 0–53)
AST: 29 U/L (ref 0–37)
Albumin: 3.1 g/dL — ABNORMAL LOW (ref 3.5–5.2)
Alkaline Phosphatase: 131 U/L — ABNORMAL HIGH (ref 39–117)
BUN: 12 mg/dL (ref 6–23)
CO2: 27 mEq/L (ref 19–32)
Calcium: 8.5 mg/dL (ref 8.4–10.5)
Chloride: 102 mEq/L (ref 96–112)
Creatinine, Ser: 0.86 mg/dL (ref 0.40–1.50)
GFR: 81.78 mL/min (ref >60.00)
Glucose, Bld: 135 mg/dL — ABNORMAL HIGH (ref 70–99)
Potassium: 4.7 mEq/L (ref 3.5–5.1)
Sodium: 134 mEq/L — ABNORMAL LOW (ref 135–145)
Total Bilirubin: 0.7 mg/dL (ref 0.2–1.2)
Total Protein: 5.6 g/dL — ABNORMAL LOW (ref 6.0–8.3)

## 2021-07-28 LAB — CBC WITH DIFFERENTIAL/PLATELET
Basophils Absolute: 0.1 10*3/uL (ref 0.0–0.1)
Basophils Relative: 1.3 % (ref 0.0–3.0)
Eosinophils Absolute: 0 10*3/uL (ref 0.0–0.7)
Eosinophils Relative: 0.6 % (ref 0.0–5.0)
HCT: 28.9 % — ABNORMAL LOW (ref 39.0–52.0)
Hemoglobin: 9.8 g/dL — ABNORMAL LOW (ref 13.0–17.0)
Lymphocytes Relative: 11.8 % — ABNORMAL LOW (ref 12.0–46.0)
Lymphs Abs: 0.6 10*3/uL — ABNORMAL LOW (ref 0.7–4.0)
MCHC: 33.7 g/dL (ref 30.0–36.0)
MCV: 103.7 fl — ABNORMAL HIGH (ref 78.0–100.0)
Monocytes Absolute: 0.2 10*3/uL (ref 0.1–1.0)
Monocytes Relative: 4.4 % (ref 3.0–12.0)
Neutro Abs: 3.9 10*3/uL (ref 1.4–7.7)
Neutrophils Relative %: 81.9 % — ABNORMAL HIGH (ref 43.0–77.0)
Platelets: 96 10*3/uL — ABNORMAL LOW (ref 150.0–400.0)
RBC: 2.79 Mil/uL — ABNORMAL LOW (ref 4.22–5.81)
RDW: 17.6 % — ABNORMAL HIGH (ref 11.5–15.5)
WBC: 4.8 10*3/uL (ref 4.0–10.5)

## 2021-07-28 LAB — TSH: TSH: 4.45 u[IU]/mL (ref 0.35–5.50)

## 2021-07-28 MED ORDER — BLOOD GLUCOSE MONITOR KIT: PACK | 0 refills | Status: DC

## 2021-07-28 MED ORDER — ROSUVASTATIN CALCIUM 10 MG PO TABS: ORAL_TABLET | ORAL | 3 refills | Status: DC

## 2021-07-28 MED ORDER — TRAMADOL HCL 50 MG PO TABS: 50.0000 mg | ORAL_TABLET | Freq: Two times a day (BID) | ORAL | 2 refills | Status: AC | PRN

## 2021-07-28 NOTE — Progress Notes (Signed)
Spoke with patient today and reviewed his pathology results.  The patient states that his diarrhea has pretty much resolved, with only one bowel daily or every other day.  Still having difficulty with urgency/incontinence despite having more formed stools, but overall much better.  Still taking a loperamide tablet before bed.  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  Endoscopic features of this entity were not noted.  The biopsies of the atypical appearing gastric lesion were not diagnostic due to scant nature (the lesion demonstrated prolonged bleeding after biopsy, so aggressive biopsies were not obtained).  I told the patient that we need to consider repeating an upper endoscopy at some point to examine and rebiopsy that lesion to exclude malignancy.  The patient would prefer not to do this unless absolutely necessary given monetary constraints related to his numerous medical problems/bills.  He has an appointment with Dr. Hilarie Fredrickson next week.  He and the patient can further discuss the need for repeat endoscopy.

## 2021-07-28 NOTE — Patient Instructions (Addendum)
Health Maintenance Due  Topic Date Due   TETANUS/TDAP   -Please get this done at your local pharmacy and have them update [place] with the information.   06/10/2016   OPHTHALMOLOGY EXAM   -If you have had your eye exam within the last year, please sign release of information at check out desk. If you have not had an eye exam within a year, please get one at this time as this is important for your diabetes care.   01/25/2019   COVID-19 Vaccine (3 - Moderna risk series)   -Patient's wife or daughter will give [place] a call back in the office with the date of his booster.   12/30/2019   INFLUENZA VACCINE   -let me know when you get at a later date 05/10/2021   We will call you within two weeks about your referral to home health physical therapy. If you do not hear within 2 weeks, give [place] a call.   Start Tramadol 50 mg twice daily as needed for pain. Could also split in half for every 4-6 hours.   Thank you for doing labs today! If you have mychart- we will send your results within 3 business days of [place] receiving them.  If you do not have mychart- we will call you about results within 5 business days of [place] receiving them.  *please also note that you will see labs on mychart as soon as they post. I will later go in and write notes on them- will say "notes from Dr. Yong Channel"  Recommended follow up: Return in about 3 months (around 10/28/2021) for follow-up or sooner if needed.

## 2021-07-30 ENCOUNTER — Telehealth: Payer: Self-pay

## 2021-07-30 NOTE — Telephone Encounter (Signed)
Pt returned call about lab results. Please Advise.

## 2021-07-31 DIAGNOSIS — D630 Anemia in neoplastic disease: Secondary | ICD-10-CM | POA: Diagnosis not present

## 2021-07-31 DIAGNOSIS — E1122 Type 2 diabetes mellitus with diabetic chronic kidney disease: Secondary | ICD-10-CM | POA: Diagnosis not present

## 2021-07-31 DIAGNOSIS — D631 Anemia in chronic kidney disease: Secondary | ICD-10-CM | POA: Diagnosis not present

## 2021-07-31 DIAGNOSIS — C220 Liver cell carcinoma: Secondary | ICD-10-CM | POA: Diagnosis not present

## 2021-07-31 DIAGNOSIS — I130 Hypertensive heart and chronic kidney disease with heart failure and stage 1 through stage 4 chronic kidney disease, or unspecified chronic kidney disease: Secondary | ICD-10-CM | POA: Diagnosis not present

## 2021-07-31 DIAGNOSIS — N183 Chronic kidney disease, stage 3 unspecified: Secondary | ICD-10-CM | POA: Diagnosis not present

## 2021-07-31 DIAGNOSIS — I5022 Chronic systolic (congestive) heart failure: Secondary | ICD-10-CM | POA: Diagnosis not present

## 2021-07-31 DIAGNOSIS — K529 Noninfective gastroenteritis and colitis, unspecified: Secondary | ICD-10-CM | POA: Diagnosis not present

## 2021-07-31 DIAGNOSIS — J449 Chronic obstructive pulmonary disease, unspecified: Secondary | ICD-10-CM | POA: Diagnosis not present

## 2021-08-02 ENCOUNTER — Telehealth: Payer: Self-pay | Admitting: Internal Medicine

## 2021-08-02 NOTE — Telephone Encounter (Signed)
Inbound call from patient wife. States patient was in the hospital 10/5 and had egd /colon done by Dr. Candis Schatz at that time. Wants to know should the procedure at [facility] with Dr. Hilarie Fredrickson be cancelled along with the follow up appt 10/27. Best contact number [phone]

## 2021-08-02 NOTE — Telephone Encounter (Signed)
See note below and advise. 

## 2021-08-03 DIAGNOSIS — I10 Essential (primary) hypertension: Secondary | ICD-10-CM | POA: Diagnosis not present

## 2021-08-03 NOTE — Telephone Encounter (Signed)
The outpatient endoscopies can be canceled at this time I do recommend given his history that he keep the follow-up appointment scheduled for later this week

## 2021-08-03 NOTE — Telephone Encounter (Signed)
Procedure appts cancelled. Left message for pts wife that he does need to keep his OV as scheduled.

## 2021-08-05 ENCOUNTER — Ambulatory Visit (INDEPENDENT_AMBULATORY_CARE_PROVIDER_SITE_OTHER): Payer: Medicare HMO | Admitting: Internal Medicine

## 2021-08-05 ENCOUNTER — Encounter: Payer: Self-pay | Admitting: *Deleted

## 2021-08-05 VITALS — BP 108/58 | HR 72 | Ht 71.0 in | Wt 205.0 lb

## 2021-08-05 DIAGNOSIS — C220 Liver cell carcinoma: Secondary | ICD-10-CM | POA: Diagnosis not present

## 2021-08-05 DIAGNOSIS — K766 Portal hypertension: Secondary | ICD-10-CM

## 2021-08-05 DIAGNOSIS — K7031 Alcoholic cirrhosis of liver with ascites: Secondary | ICD-10-CM | POA: Diagnosis not present

## 2021-08-05 DIAGNOSIS — K319 Disease of stomach and duodenum, unspecified: Secondary | ICD-10-CM | POA: Diagnosis not present

## 2021-08-05 NOTE — Patient Instructions (Addendum)
If you are age 80 or older, your body mass index should be between 23-30. Your Body mass index is 28.59 kg/m. If this is out of the aforementioned range listed, please consider follow up with your Primary Care Provider. ________________________________________________________  The [facility] providers would like to encourage you to use [facility] to communicate with providers for non-urgent requests or questions.  Due to long hold times on the telephone, sending your provider a message by [facility] may be a faster and more efficient way to get a response.  Please allow 48 business hours for a response.  Please remember that this is for non-urgent requests.  _______________________________________________________  Continue current medications as discussed.  You are scheduled to follow up 11-10-21 at 10:10am.  Thank you for entrusting me with your care and choosing [facility].  Dr Hilarie Fredrickson

## 2021-08-05 NOTE — Progress Notes (Signed)
Subjective:    Patient ID: [name], male    DOB: 12/09/1940, 80 y.o.   MRN: [id number]  HPI [name] is an 80 year old male with a history of cirrhosis with HCC s/p SBRT, CHF, diabetes, chronic diarrhea who is seen for follow-up after hospitalization.  He is here with his wife today.  He was hospitalized in the setting of severe diarrhea with profound fatigue and volume depletion.  He underwent upper endoscopy and colonoscopy during that hospitalization.  EGD on 07/13/2021 showed small varices, portal hypertensive gastropathy and an atypical appearing flat lesion in the stomach with raised borders.  This was biopsied.  Pathology = duodenal biopsies normal.  Stomach flat lesion scant gastric mucosa with acute inflammation and a few moderately atypical glands/cell clusters (comment: Changes may represent reactive atypia but a neoplastic process cannot be ruled out, mainly due to the scant nature of the biopsy); stomach biopsies parietal cell hyperplasia without H. pylori.  Colonoscopy on 07/13/2021 showed severe diverticulosis but was otherwise normal.  Biopsies negative for microscopic colitis.  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  Since discharge from the hospital his diarrhea has completely resolved.  He does have a poor appetite with some early satiety but no nausea or vomiting.  No abdominal pain.  He is drinking Ensure or boost at least once daily.  His bowel movements have been formed and complete.  He has had scant red blood with wiping.  Unfortunately he had a fall immediately on getting back home from the hospital.  He was using a cane to ambulate prior to hospitalization and is now using a walker.  He has been waiting to get physical therapy set up in the home.  They hope to come out to evaluate him this coming Monday.   Review of Systems As per HPI, otherwise negative  Current Medications, Allergies, Past Medical History, Past Surgical History, Family History and Social  History were reviewed in [employer] record.    Objective:   Physical Exam Ht 5\' 11"  (1.803 m)   Wt 205 lb (93 kg)   BMI 28.59 kg/m  Gen: awake, alert, NAD HEENT: anicteric  CV: RRR, no mrg Pulm: Mild decreased breath sounds bilateral bases, no Rales or rhonchi Abd: soft, obese, nontender, +BS throughout Ext: no c/c/e Neuro: nonfocal, no asterixis  CBC    Component Value Date/Time   WBC 4.8 07/28/2021 1206   RBC 2.79 (L) 07/28/2021 1206   HGB 9.8 (L) 07/28/2021 1206   HGB 9.9 (L) 07/06/2021 1319   HCT 28.9 (L) 07/28/2021 1206   HCT 33.1 (L) 06/21/2018 1332   PLT 96.0 (L) 07/28/2021 1206   PLT 83 (L) 07/06/2021 1319   MCV 103.7 (H) 07/28/2021 1206   MCH 34.7 (H) 07/19/2021 1158   MCHC 33.7 07/28/2021 1206   RDW 17.6 (H) 07/28/2021 1206   LYMPHSABS 0.6 (L) 07/28/2021 1206   MONOABS 0.2 07/28/2021 1206   EOSABS 0.0 07/28/2021 1206   BASOSABS 0.1 07/28/2021 1206   CMP     Component Value Date/Time   NA 134 (L) 07/28/2021 1206   NA 143 01/15/2020 0000   K 4.7 07/28/2021 1206   CL 102 07/28/2021 1206   CO2 27 07/28/2021 1206   GLUCOSE 135 (H) 07/28/2021 1206   BUN 12 07/28/2021 1206   BUN 17 01/15/2020 0000   CREATININE 0.86 07/28/2021 1206   CREATININE 0.98 07/06/2021 1319   CALCIUM 8.5 07/28/2021 1206   PROT 5.6 (L) 07/28/2021 1206  ALBUMIN 3.1 (L) 07/28/2021 1206   AST 29 07/28/2021 1206   AST 32 07/06/2021 1319   ALT 15 07/28/2021 1206   ALT 15 07/06/2021 1319   ALKPHOS 131 (H) 07/28/2021 1206   BILITOT 0.7 07/28/2021 1206   BILITOT 0.9 07/06/2021 1319   GFRNONAA >60 07/19/2021 0011   GFRNONAA >60 07/06/2021 1319   GFRAA 59 (L) 06/09/2020 0859   Lab Results  Component Value Date   INR 1.0 05/06/2021   INR 1.0 08/06/2020   INR 1.1 06/01/2020   MRI ABDOMEN WITHOUT AND WITH CONTRAST   TECHNIQUE: Multiplanar multisequence MR imaging of the abdomen was performed both before and after the administration of intravenous  contrast.   CONTRAST:  63mL GADAVIST GADOBUTROL 1 MMOL/ML IV SOLN   COMPARISON:  MRI 03/23/2021   FINDINGS: Lower chest: No acute findings.   Hepatobiliary: The liver has a cirrhotic appearance. Treated lesion within segment 4A is again seen. On today's study this measures 2.1 x 1.7 cm, image 23/17. Formally 2.8 x 2.2 cm. Persistent rim arterial hyperenhancement with diminished internal enhancement. Surrounding fibrosis with volume loss is again noted.   The arterial phase enhancing lesion within lateral segment of left hepatic lobe measures 1.6 x 1.5 cm, image 30/17. Unchanged in size from previous exam. This has a pseudo capsule on the portal venous phase images with delayed internal washout compatible with LR-5 lesion.   Status post cholecystectomy. Postoperative bile duct dilatation is again noted.   Pancreas: No mass, inflammatory changes, or other parenchymal abnormality identified.   Spleen: Spleen is enlarged measuring 15 cm in cranial caudal dimension. Formally this measured the same. No focal splenic abnormality.   Adrenals/Urinary Tract: Normal adrenal glands. Bilateral renal cortical scarring. Cyst noted within posterior cortex of interpolar right kidney measuring 1.2 cm.   Stomach/Bowel: Visualized portions within the abdomen are unremarkable.   Vascular/Lymphatic: Normal caliber abdominal aorta. No abdominal adenopathy.   Other:  Interval increase in volume of abdominopelvic ascites.   Musculoskeletal: No suspicious bone lesions identified.   IMPRESSION: 1. Treated lesion within segment 4A is again noted. Mildly decreased in size in the interval. Persistent rim arterial phase hyperenhancement with diminished internal enhancement compatible with residual viable tumor. 2. Unchanged size of LR-5 lesion within lateral segment of left hepatic lobe compatible with hepatocellular carcinoma. 3. Cirrhosis with splenomegaly. 4. Interval increase in volume of  abdominopelvic ascites.     Electronically Signed   By: Kerby Moors M.D.   On: 07/03/2021 13:00    CT ABDOMEN AND PELVIS WITH CONTRAST   TECHNIQUE: Multidetector CT imaging of the abdomen and pelvis was performed using the standard protocol following bolus administration of intravenous contrast.   CONTRAST:  125mL OMNIPAQUE IOHEXOL 350 MG/ML SOLN   COMPARISON:  07/01/2021 MRI   FINDINGS: Lower chest: Lung bases are free of acute infiltrate or sizable effusion. Herniation of some omental fat is noted through the esophageal hiatus. No definitive herniation of the stomach is seen.   Hepatobiliary: Liver is well visualized. Postsurgical changes are noted. Fiducial markings are again noted. Prior treated lesion is again seen and stable. The left lobe lesion marked by the fiducial markers is less well visualized than that on recent MRI. No new focal lesion is seen.   Pancreas: Unremarkable. No pancreatic ductal dilatation or surrounding inflammatory changes.   Spleen: Normal in size without focal abnormality.   Adrenals/Urinary Tract: Adrenal glands are within normal limits. Kidneys demonstrate a normal enhancement pattern bilaterally. No renal calculi  or obstructive changes are seen. Ureters are within normal limits. The bladder is decompressed.   Stomach/Bowel: Diverticular change of the colon is noted without definitive diverticulitis. No obstructive or inflammatory changes are seen. The appendix is well visualized and within normal limits. Small bowel and stomach are unremarkable.   Vascular/Lymphatic: Aortic atherosclerosis. No enlarged abdominal or pelvic lymph nodes.   Reproductive: Prostate is unremarkable.   Other: Slight increase in ascites is noted when compared with the prior MRI. No free air is noted.   Musculoskeletal: Degenerative changes of lumbar spine are noted.   IMPRESSION: Known hepatic lesions appear stable from recent MRI. The left  lobe lesion is less well visualized than on recent MRI.   Slight increase in the degree of ascites when compare with the prior study.   Diverticular change without definitive diverticulitis.     Electronically Signed   By: Inez Catalina M.D.   On: 07/12/2021 23:48       Assessment & Plan:  80 year old male with a history of cirrhosis with Porter s/p SBRT, CHF, diabetes, chronic diarrhea who is seen for follow-up after hospitalization.   Chronic diarrhea --now resolved.  Unclear etiology.  Colonoscopy unrevealing other than severe diverticulosis.  Biopsies negative for microscopic colitis.  No treatment at this point given improvement  2.  Cirrhosis with portal hypertension and HCC --he is following with Dr. Irene Limbo for his [facility] which has been treated by IR.  Overall his liver disease at this point remains moderately well compensated.  He has had ascites but no encephalopathy or bleeding.  His bilirubin, creatinine and INR are normal and therefore his MELD score is low. --Ascites --not currently a problem but also not on diuretic therapy.  We could consider low-dose Lasix and Aldactone if ascites continues to be an issue.  Would need to monitor sodium and creatinine if we initiate diuretic therapy --HCC --treated and being followed by oncology; follow-up imaging planned for January  3.  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  The lesion seen by Dr. Candis Schatz looks like it may have been an ulcer scar.  Biopsies showed atypical cells but I favor this to be reactive.  We certainly discussed repeat upper endoscopy but at this point he feels too weak to undergo upper endoscopy.  This is reasonable.  He does not have H. pylori.  We will discuss this again in 3 months and consider upper endoscopy versus observation --Observation for now, discuss at follow-up  4.  Deconditioning --he has followed closely with Dr. Yong Channel his primary care provider and will  hopefully start home physical therapy soon.  Follow-up in late January or early February  30 minutes total spent today including patient facing time, coordination of care, reviewing medical history/procedures/pertinent radiology studies, and documentation of the encounter.

## 2021-08-09 ENCOUNTER — Ambulatory Visit: Payer: Medicare HMO

## 2021-08-09 DIAGNOSIS — D630 Anemia in neoplastic disease: Secondary | ICD-10-CM | POA: Diagnosis not present

## 2021-08-09 DIAGNOSIS — K529 Noninfective gastroenteritis and colitis, unspecified: Secondary | ICD-10-CM | POA: Diagnosis not present

## 2021-08-09 DIAGNOSIS — J449 Chronic obstructive pulmonary disease, unspecified: Secondary | ICD-10-CM | POA: Diagnosis not present

## 2021-08-09 DIAGNOSIS — I130 Hypertensive heart and chronic kidney disease with heart failure and stage 1 through stage 4 chronic kidney disease, or unspecified chronic kidney disease: Secondary | ICD-10-CM | POA: Diagnosis not present

## 2021-08-09 DIAGNOSIS — E1122 Type 2 diabetes mellitus with diabetic chronic kidney disease: Secondary | ICD-10-CM | POA: Diagnosis not present

## 2021-08-09 DIAGNOSIS — N183 Chronic kidney disease, stage 3 unspecified: Secondary | ICD-10-CM | POA: Diagnosis not present

## 2021-08-09 DIAGNOSIS — C220 Liver cell carcinoma: Secondary | ICD-10-CM | POA: Diagnosis not present

## 2021-08-09 DIAGNOSIS — D631 Anemia in chronic kidney disease: Secondary | ICD-10-CM | POA: Diagnosis not present

## 2021-08-09 DIAGNOSIS — I5022 Chronic systolic (congestive) heart failure: Secondary | ICD-10-CM | POA: Diagnosis not present

## 2021-08-12 DIAGNOSIS — I5022 Chronic systolic (congestive) heart failure: Secondary | ICD-10-CM | POA: Diagnosis not present

## 2021-08-12 DIAGNOSIS — E1122 Type 2 diabetes mellitus with diabetic chronic kidney disease: Secondary | ICD-10-CM | POA: Diagnosis not present

## 2021-08-12 DIAGNOSIS — K529 Noninfective gastroenteritis and colitis, unspecified: Secondary | ICD-10-CM | POA: Diagnosis not present

## 2021-08-12 DIAGNOSIS — D630 Anemia in neoplastic disease: Secondary | ICD-10-CM | POA: Diagnosis not present

## 2021-08-12 DIAGNOSIS — N183 Chronic kidney disease, stage 3 unspecified: Secondary | ICD-10-CM | POA: Diagnosis not present

## 2021-08-12 DIAGNOSIS — J449 Chronic obstructive pulmonary disease, unspecified: Secondary | ICD-10-CM | POA: Diagnosis not present

## 2021-08-12 DIAGNOSIS — D631 Anemia in chronic kidney disease: Secondary | ICD-10-CM | POA: Diagnosis not present

## 2021-08-12 DIAGNOSIS — I130 Hypertensive heart and chronic kidney disease with heart failure and stage 1 through stage 4 chronic kidney disease, or unspecified chronic kidney disease: Secondary | ICD-10-CM | POA: Diagnosis not present

## 2021-08-12 DIAGNOSIS — C220 Liver cell carcinoma: Secondary | ICD-10-CM | POA: Diagnosis not present

## 2021-08-17 DIAGNOSIS — E1122 Type 2 diabetes mellitus with diabetic chronic kidney disease: Secondary | ICD-10-CM | POA: Diagnosis not present

## 2021-08-17 DIAGNOSIS — D630 Anemia in neoplastic disease: Secondary | ICD-10-CM | POA: Diagnosis not present

## 2021-08-17 DIAGNOSIS — I130 Hypertensive heart and chronic kidney disease with heart failure and stage 1 through stage 4 chronic kidney disease, or unspecified chronic kidney disease: Secondary | ICD-10-CM | POA: Diagnosis not present

## 2021-08-17 DIAGNOSIS — N183 Chronic kidney disease, stage 3 unspecified: Secondary | ICD-10-CM | POA: Diagnosis not present

## 2021-08-17 DIAGNOSIS — J449 Chronic obstructive pulmonary disease, unspecified: Secondary | ICD-10-CM | POA: Diagnosis not present

## 2021-08-17 DIAGNOSIS — C220 Liver cell carcinoma: Secondary | ICD-10-CM | POA: Diagnosis not present

## 2021-08-17 DIAGNOSIS — D631 Anemia in chronic kidney disease: Secondary | ICD-10-CM | POA: Diagnosis not present

## 2021-08-17 DIAGNOSIS — K529 Noninfective gastroenteritis and colitis, unspecified: Secondary | ICD-10-CM | POA: Diagnosis not present

## 2021-08-17 DIAGNOSIS — I5022 Chronic systolic (congestive) heart failure: Secondary | ICD-10-CM | POA: Diagnosis not present

## 2021-08-18 ENCOUNTER — Telehealth: Payer: Self-pay | Admitting: Internal Medicine

## 2021-08-19 ENCOUNTER — Other Ambulatory Visit: Payer: Self-pay | Admitting: Family Medicine

## 2021-08-19 ENCOUNTER — Telehealth: Payer: Self-pay | Admitting: Cardiology

## 2021-08-19 DIAGNOSIS — C220 Liver cell carcinoma: Secondary | ICD-10-CM | POA: Diagnosis not present

## 2021-08-19 DIAGNOSIS — D631 Anemia in chronic kidney disease: Secondary | ICD-10-CM | POA: Diagnosis not present

## 2021-08-19 DIAGNOSIS — J449 Chronic obstructive pulmonary disease, unspecified: Secondary | ICD-10-CM | POA: Diagnosis not present

## 2021-08-19 DIAGNOSIS — E1122 Type 2 diabetes mellitus with diabetic chronic kidney disease: Secondary | ICD-10-CM | POA: Diagnosis not present

## 2021-08-19 DIAGNOSIS — I5022 Chronic systolic (congestive) heart failure: Secondary | ICD-10-CM | POA: Diagnosis not present

## 2021-08-19 DIAGNOSIS — N183 Chronic kidney disease, stage 3 unspecified: Secondary | ICD-10-CM | POA: Diagnosis not present

## 2021-08-19 DIAGNOSIS — K529 Noninfective gastroenteritis and colitis, unspecified: Secondary | ICD-10-CM | POA: Diagnosis not present

## 2021-08-19 DIAGNOSIS — I130 Hypertensive heart and chronic kidney disease with heart failure and stage 1 through stage 4 chronic kidney disease, or unspecified chronic kidney disease: Secondary | ICD-10-CM | POA: Diagnosis not present

## 2021-08-19 DIAGNOSIS — D630 Anemia in neoplastic disease: Secondary | ICD-10-CM | POA: Diagnosis not present

## 2021-08-19 NOTE — Telephone Encounter (Signed)
Patient's daughter was calling in to see patient an earlier appt. She states that the swelling its getting worse. Please advise

## 2021-08-19 NOTE — Telephone Encounter (Signed)
Attempt to reach, no answer no voicemail

## 2021-08-19 NOTE — Telephone Encounter (Signed)
Attempt to reach, line busy.

## 2021-08-19 NOTE — Telephone Encounter (Signed)
Pts daughter states he has not been able to eat much and when he does eat he is very uncomfortable due to his abdomen bloating. Requesting to be seen. Pt scheduled to see Abe Schools NP 08/25/21 at 1:30pm. Daughter aware of appt.

## 2021-08-20 NOTE — Telephone Encounter (Signed)
Returned call to daughter. No answer. Unable to leave voicemail.

## 2021-08-21 ENCOUNTER — Other Ambulatory Visit: Payer: Self-pay | Admitting: Family Medicine

## 2021-08-23 ENCOUNTER — Ambulatory Visit (HOSPITAL_COMMUNITY): Admit: 2021-08-23 | Payer: Medicare HMO | Admitting: Internal Medicine

## 2021-08-23 ENCOUNTER — Encounter (HOSPITAL_COMMUNITY): Payer: Self-pay

## 2021-08-23 SURGERY — ESOPHAGOGASTRODUODENOSCOPY (EGD) WITH PROPOFOL
Anesthesia: Monitor Anesthesia Care

## 2021-08-23 NOTE — Progress Notes (Signed)
Cardiology Office Note  Date: 08/23/2021   ID: [name], DOB 05-24-1941, MRN [id number]  PCP:  [name], MD  Cardiologist:  Rozann Lesches, MD Electrophysiologist:  None   Chief Complaint: Leg swelling/shortness of breath  History of Present Illness: [name] is a 80 y.o. male with a history of HTN, chronic systolic heart failure, aortic atherosclerosis, esophageal varices, COPD, ILD, fatty liver disease, GERD, alcoholic cirrhosis, DM2, diabetic polyneuropathy, HLD, CKD stage III, thrombocytopenia.  He was last seen by Dr. Domenic Polite on 07/05/2021.  He was complaining of some loose stools recently without abdominal pain.  He was nervous in anticipation of oncology follow-up that week with hepatocellular carcinoma.  He reported no fluid retention.  Weight loss down 5 pounds have been compliant since.  His Entresto price had gone up and office was looking into patient assistance.  Prior echocardiogram September 2021 revealed 50 to 55% EF with mild to moderate aortic stenosis.  He had improvement of his LVEF on medical therapy with EF last assessed at 50 to 55% in November 2021.  He had been managed conservatively in setting of hepatocellular carcinoma.  Plan was to continue Coreg, Entresto, aspirin, Crestor, Lasix.  Echo showed mild to moderate calcific aortic stenosis with mean gradient 16 mm by prior assessment.  Plan was for follow-up echocardiogram for his next visit.  He was continuing to follow with oncology for hepatocellular carcinoma  Recent hospitalization on 07/12/2020 for complaint of generalized weakness, poor appetite.  Chest x-ray showed possible pneumonia.  UA with more than 50 white blood cells.  He was treated with Rocephin and Zithromax.  He had pancytopenia related to infection.  He had worsening leukopenia.  Dr. [name] oncology was consulted.  He received Granix.  Antibiotics were stopped, WBC increased to 7.  He underwent endoscopic a and colonoscopy  colonoscopy showed diverticulosis.  Biopsy was obtained to rule out microscopic cells.  Endoscopically with a small flat lesion in his stomach.  Biopsies obtained.  History of chronic hepatocellular carcinoma.  Diabetes is stable.  Hypertension was continuing on the carvedilol.  COPD was stable.  History of systolic heart failure with EF of 25% in February 2021.  August 2021 EF improved to 55% [name] was held while having diarrhea.  He was to resume Entresto at discharge.  He is here today with complaints of continuing/increasing shortness of breath.  He has significant ascitic abdomen.  He had seen Dr. Hilarie Fredrickson recently who suggested he be placed on a diuretic either Lasix or spironolactone.  We discussed starting spironolactone.  Patient states he cannot breathe due to the increasing size of his abdomen.  States when he tries to lay down to sleep at night his breathing gets worse because his belly seems to be pushing up on his diaphragm.  He states he is miserable due to the increasing size says that his abdomen and breathing issues.  He has a history of aortic stenosis and at last visit Dr. Domenic Polite suggested getting a follow-up echocardiogram at next follow-up.  We discussed starting Lasix and getting a follow-up echocardiogram to reassess aortic stenosis.   We need to order an echocardiogram  Past Medical History:  Diagnosis Date   Benign prostatic hypertrophy    Blood transfusion without reported diagnosis    CHF (congestive heart failure) (HCC)    a. EF 20-25% by echo in 11/2019   COPD (chronic obstructive pulmonary disease) ([place]) 12/15/2009   FEV1 2.30 (70%) ratio 63 no better  with B2 and DLCO 18/6 (73%) corrects to 106%   Degenerative joint disease    Depression    Diverticulosis    Esophageal varices (HCC)    Essential hypertension    ACE inhibitor cough   Fatty liver    Hepatocellular carcinoma (HCC)    Hyperlipidemia    Internal hemorrhoids    Liver cancer ([place])    Low back pain     Otitis externa 07/30/2012   Portal hypertensive gastropathy (HCC)    Splenomegaly    Thrombocytopenia (HCC)    Type 2 diabetes mellitus ([place])    Ulcer of foot ([place])    Right foot    Past Surgical History:  Procedure Laterality Date   BIOPSY  07/14/2021   Procedure: BIOPSY;  Surgeon: Daryel November, MD;  Location: [facility];  Service: Gastroenterology;;  EGD and COLON   COLONOSCOPY  2020   JMP-MAC-plenvu (good)-polyps-recall 1 yr   COLONOSCOPY WITH PROPOFOL N/A 07/14/2021   Procedure: COLONOSCOPY WITH PROPOFOL;  Surgeon: Daryel November, MD;  Location: [facility] ENDOSCOPY;  Service: Gastroenterology;  Laterality: N/A;   ESOPHAGOGASTRODUODENOSCOPY (EGD) WITH PROPOFOL N/A 07/14/2021   Procedure: ESOPHAGOGASTRODUODENOSCOPY (EGD) WITH PROPOFOL;  Surgeon: Daryel November, MD;  Location: [facility];  Service: Gastroenterology;  Laterality: N/A;   IR EMBO TUMOR ORGAN ISCHEMIA INFARCT INC GUIDE ROADMAPPING  06/01/2020   IR RADIOLOGIST EVAL & MGMT  04/28/2020   IR RADIOLOGIST EVAL & MGMT  07/15/2020   IR RADIOLOGIST EVAL & MGMT  05/04/2021   NO PAST SURGERIES  1980    Current Outpatient Medications  Medication Sig Dispense Refill   albuterol (VENTOLIN HFA) 108 (90 Base) MCG/ACT inhaler Inhale 2 puffs into the lungs every 6 (six) hours as needed for wheezing or shortness of breath. 6.7 g 3   Alcohol Swabs (DROPSAFE ALCOHOL PREP) 70 % PADS USE  UP  TO FOUR TIMES DAILY AS DIRECTED 100 each 3   aspirin EC 81 MG tablet Take 81 mg by mouth daily.     blood glucose meter kit and supplies KIT Dispense based on patient and insurance preference. Use up to four times daily as directed. 1 each 0   Blood Glucose Monitoring Suppl (TRUE METRIX AIR GLUCOSE METER) w/Device KIT Use to check blood sugars daily. Dx: E11.9 1 kit 3   busPIRone (BUSPAR) 7.5 MG tablet Take 1 tablet (7.5 mg total) by mouth 2 (two) times daily as needed. for anxiety (Patient taking differently: Take 7.5 mg by mouth at  bedtime.) 180 tablet 3   carvedilol (COREG) 6.25 MG tablet TAKE 1 TABLET TWICE DAILY (DOSE INCREASE) (Patient taking differently: Take 6.25 mg by mouth 2 (two) times daily with a meal.) 180 tablet 3   ENTRESTO 49-51 MG TAKE 1 TABLET TWICE DAILY (Patient taking differently: Take 1 tablet by mouth 2 (two) times daily.) 180 tablet 3   escitalopram (LEXAPRO) 5 MG tablet Take 1 tablet (5 mg total) by mouth daily. 90 tablet 3   finasteride (PROSCAR) 5 MG tablet TAKE 1 TABLET EVERY DAY 90 tablet 1   gabapentin (NEURONTIN) 300 MG capsule TAKE 1 CAPSULE TWICE DAILY (Patient taking differently: Take 300 mg by mouth 2 (two) times daily.) 180 capsule 3   glucose blood (TRUE METRIX BLOOD GLUCOSE TEST) test strip TEST BLOOD SUGAR  UP  TO FOUR TIMES DAILY AS DIRECTED 100 strip 1   loperamide (IMODIUM) 2 MG capsule Take 1 capsule (2 mg total) by mouth 3 (three) times daily between meals as  needed for diarrhea or loose stools. 30 capsule 0   Multiple Vitamin (MULTIVITAMIN WITH MINERALS) TABS tablet Take 1 tablet by mouth daily. 30 tablet 0   Polyvinyl Alcohol-Povidone (REFRESH OP) Place 1 drop into both eyes at bedtime.     rosuvastatin (CRESTOR) 10 MG tablet TAKE 1 TABLET EVERY DAY 90 tablet 3   tamsulosin (FLOMAX) 0.4 MG CAPS capsule TAKE 1 CAPSULE EVERY DAY (Patient taking differently: Take 0.4 mg by mouth daily.) 90 capsule 1   TRUEplus Lancets 28G MISC TEST BLOOD SUGAR  UP  TO FOUR TIMES DAILY AS DIRECTED 100 each 3   No current facility-administered medications for this visit.   Allergies:  Patient has no known allergies.   Social History: The patient  reports that he quit smoking about 36 years ago. His smoking use included cigarettes. He has a 80.00 pack-year smoking history. He has never used smokeless tobacco. He reports that he does not drink alcohol and does not use drugs.   Family History: The patient's family history includes CAD in his brother; Melanoma in his mother; Stroke in his father.   ROS:   Please see the history of present illness. Otherwise, complete review of systems is positive for none.  All other systems are reviewed and negative.   Physical Exam: VS:  There were no vitals taken for this visit., BMI There is no height or weight on file to calculate BMI.  Wt Readings from Last 3 Encounters:  08/05/21 205 lb (93 kg)  07/28/21 206 lb 6.4 oz (93.6 kg)  07/19/21 201 lb 11.5 oz (91.5 kg)    General: Patient appears comfortable at rest. HEENT: Conjunctiva and lids normal, oropharynx clear with moist mucosa. Neck: Supple, no elevated JVP or carotid bruits, no thyromegaly. Lungs: Clear to auscultation, nonlabored breathing at rest. Cardiac: Regular rate and rhythm, no S3 or significant systolic murmur, no pericardial rub. Abdomen: Soft, nontender, no hepatomegaly, bowel sounds present, no guarding or rebound. Extremities: No pitting edema, distal pulses 2+. Skin: Warm and dry. Musculoskeletal: No kyphosis. Neuropsychiatric: Alert and oriented x3, affect grossly appropriate.  ECG:    Recent Labwork: 07/13/2021: Magnesium 1.9 07/28/2021: ALT 15; AST 29; BUN 12; Creatinine, Ser 0.86; Hemoglobin 9.8; Platelets 96.0; Potassium 4.7; Sodium 134; TSH 4.45     Component Value Date/Time   CHOL 120 11/07/2019 1146   TRIG 126.0 11/07/2019 1146   HDL 45.60 11/07/2019 1146   CHOLHDL 3 11/07/2019 1146   VLDL 25.2 11/07/2019 1146   LDLCALC 49 11/07/2019 1146   LDLDIRECT 57.0 10/22/2018 1451    Other Studies Reviewed Today:  Colonoscopy 07/14/2021 Hemorrhoids found on perianal exam. - Severe diverticulosis in the sigmoid colon, in the descending colon, in the transverse colon and in the ascending colon. There was no evidence of diverticular bleeding. - Normal mucosa in the entire examined colon. Biopsied. - The distal rectum and anal verge are normal on retroflexion view. - No endoscopic abnormalities were found to explain patient's diarrhea. Await biopsy results for evidence  of microscopic colitis.   Upper endoscopy 07/14/2021 Grade I varices were found in the lower third of the esophagus. Findings: The exam of the esophagus was otherwise normal. Mild portal hypertensive gastropathy was found in the entire examined stomach. Biopsies were taken with a cold forceps for Helicobacter pylori testing. Estimated blood loss was minimal. A focal flat lesion characterized by smoothness and absence of mucosa were found in the gastric body. Submucosa not visible. The edges of the lesion were nodular  and irregular. Biopsies were taken with a cold forceps for histology in the middle of the lesion and on the border. The lesion was friable and bled briskly but stopped spontaneously without intervention. Only two tissue samples were obtained because of friability. Estimated blood loss was minimal. The exam of the stomach was otherwise normal.  Chest x-ray 07/15/2021 IMPRESSION: Patchy opacities in the left lower lobe suspicious for pneumonia in the correct clinical setting. Recommend follow-up radiographs in 6-8 weeks to assess for resolution  CT abdomen pelvis 07/12/2021 IMPRESSION: Known hepatic lesions appear stable from recent MRI. The left lobe lesion is less well visualized than on recent MRI. Slight increase in the degree of ascites when compare with the prior study. Diverticular change without definitive diverticulitis.  MRI abdomen without contrast 07/03/2021 IMPRESSION: 1. Treated lesion within segment 4A is again noted. Mildly decreased in size in the interval. Persistent rim arterial phase hyperenhancement with diminished internal enhancement compatible with residual viable tumor. 2. Unchanged size of LR-5 lesion within lateral segment of left hepatic lobe compatible with hepatocellular carcinoma. 3. Cirrhosis with splenomegaly. 4. Interval increase in volume of abdominopelvic ascites.    Echocardiogram 06/17/2020:  1. Left ventricular ejection fraction,  by estimation, is 50 to 55%. The  left ventricle has low normal function. The left ventricle has no regional  wall motion abnormalities. There is mild left ventricular hypertrophy.  Left ventricular diastolic  parameters are consistent with Grade I diastolic dysfunction (impaired  relaxation). Elevated left atrial pressure.   2. Right ventricular systolic function is normal. The right ventricular  size is normal.   3. Left atrial size was severely dilated.   4. The mitral valve is normal in structure. No evidence of mitral valve  regurgitation. No evidence of mitral stenosis.   5. The aortic valve was not well visualized. There is moderate  calcification of the aortic valve. There is moderate thickening of the  aortic valve. Aortic valve regurgitation is not visualized. Mild to  moderate aortic valve stenosis. Aortic valve mean  gradient measures 16.0 mmHg. Aortic valve peak gradient measures 25.8  mmHg. Aortic valve area, by VTI measures 1.23 cm.   6. The inferior vena cava is normal in size with greater than 50%  respiratory variability, suggesting right atrial pressure of 3 mmHg.     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 Aortic valve stenosis, etiology of cardiac valve disease unspecified   3. 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  Follow-up echo in August 2021 showed EF improved at 55%.  He states he is having issues breathing because he increasing girth of his abdomen encroaching on his diaphragm.  He states he is having issues when laying and trying to breathe.  He states his abdomen has increased in size since discharge from the hospital.  Continue Entresto 49/51 mg p.o. twice daily.  Continue Coreg 6.25 mg p.o. twice daily.  2. Aortic valve stenosis, etiology of cardiac valve disease unspecified History of secondary cardiomyopathy with aortic stenosis.  Complaining  of increasing dyspnea.  Please get a follow-up echocardiogram to reassess LV function, diastolic function and valvular function. Echocardiogram on 06/17/2020 showed EF of 50 to 55%, mild LVH, G1 DD, elevated LA pressures, LA severely dilated, mild to moderate aortic valve stenosis, aortic valve mean gradient 16 mmHg.  Peak gradient 25.8 mmHg.  AVA by VTI 1.23 cm.  3. Hepatocellular carcinoma ([place]) History  of alcoholic cirrhosis and subsequent hepatocellular carcinoma.  Recent MRI of the abdomen for restaging of his hepatocellular carcinoma showing cirrhotic appearance of the liver.  Treated lesion with segment 4A is again seen.  Mildly decreased in size in the interval.  Persistent rim arterial phase hyperenhancement with diminished internal enhancement compatible with residual viable tumor.  Unchanged size of LR 5 lesion with lateral segment of left hepatic lobe compatible with hepatocellular carcinoma.  Cirrhosis with splenomegaly.  Interval increase in volume of abdominal pelvic ascites.  Had a recent CT-guided fiducial marker placement.   4.  Ascites History of alcoholic liver cirrhosis with hepatocellular carcinoma.  He has portal gastropathy and portal hypertension and what appears to be significant ascites.  His  gastroenterologist suggested adding Lasix or spironolactone to help with ascites.  We are starting Lasix 20 mg daily to help.  We will get a follow-up BMP and magnesium in 2 weeks.  Patient states he would like to get some relief from the fluid accumulating in his abdomen.  We discussed he should have a conversation with his gastroenterologist for possible paracentesis if gastroenterologist thought this was appropriate.  He asked me to send my note to Dr. Hilarie Fredrickson so he would be informed.  Medication Adjustments/Labs and Tests Ordered: Current medicines are reviewed at length with the patient today.  Concerns regarding medicines are outlined above.   Disposition: Follow-up with Dr. Domenic Polite  or APP 2 months  Signed, Levell July, NP 08/23/2021 7:46 PM    [facility] [place], [place], [place] [address] Phone: [phone]; Fax: [phone]

## 2021-08-23 NOTE — Telephone Encounter (Signed)
Pt has appt 11/15 with Lawanda Cousins, NP in the [facility]

## 2021-08-24 ENCOUNTER — Encounter: Payer: Self-pay | Admitting: Family Medicine

## 2021-08-24 ENCOUNTER — Ambulatory Visit (INDEPENDENT_AMBULATORY_CARE_PROVIDER_SITE_OTHER): Payer: Medicare HMO | Admitting: Family Medicine

## 2021-08-24 VITALS — BP 136/70 | HR 108 | Ht 73.0 in | Wt 205.6 lb

## 2021-08-24 DIAGNOSIS — C220 Liver cell carcinoma: Secondary | ICD-10-CM

## 2021-08-24 DIAGNOSIS — Z79899 Other long term (current) drug therapy: Secondary | ICD-10-CM

## 2021-08-24 DIAGNOSIS — I429 Cardiomyopathy, unspecified: Secondary | ICD-10-CM

## 2021-08-24 DIAGNOSIS — I350 Nonrheumatic aortic (valve) stenosis: Secondary | ICD-10-CM

## 2021-08-24 MED ORDER — FUROSEMIDE 20 MG PO TABS: 20.0000 mg | ORAL_TABLET | Freq: Every day | ORAL | 1 refills | Status: DC

## 2021-08-24 NOTE — Patient Instructions (Addendum)
Medication Instructions:  Your physician has recommended you make the following change in your medication:  Start furosemide 20 mg daily Continue other medications the same  Labwork: BMET & Mg in 2 weeks 09/07/2021 [facility]  Testing/Procedures: Your physician has requested that you have an echocardiogram. Echocardiography is a painless test that uses sound waves to create images of your heart. It provides your doctor with information about the size and shape of your heart and how well your heart's chambers and valves are working. This procedure takes approximately one hour. There are no restrictions for this procedure.  Follow-Up: Your physician recommends that you schedule a follow-up appointment in: 2 months  Any Other Special Instructions Will Be Listed Below (If Applicable).  If you need a refill on your cardiac medications before your next appointment, please call your pharmacy.

## 2021-08-25 ENCOUNTER — Ambulatory Visit (HOSPITAL_COMMUNITY)
Admission: RE | Admit: 2021-08-25 | Discharge: 2021-08-25 | Disposition: A | Discharge status: Home / Self Care | Payer: Medicare HMO | Source: Ambulatory Visit | Attending: Nurse Practitioner | Admitting: Nurse Practitioner

## 2021-08-25 ENCOUNTER — Other Ambulatory Visit: Payer: Self-pay

## 2021-08-25 ENCOUNTER — Other Ambulatory Visit (INDEPENDENT_AMBULATORY_CARE_PROVIDER_SITE_OTHER): Payer: Medicare HMO

## 2021-08-25 ENCOUNTER — Encounter: Payer: Self-pay | Admitting: Nurse Practitioner

## 2021-08-25 ENCOUNTER — Ambulatory Visit: Payer: Medicare HMO | Admitting: Nurse Practitioner

## 2021-08-25 ENCOUNTER — Ambulatory Visit (INDEPENDENT_AMBULATORY_CARE_PROVIDER_SITE_OTHER)
Admission: RE | Admit: 2021-08-25 | Discharge: 2021-08-25 | Disposition: A | Discharge status: Home / Self Care | Payer: Medicare HMO | Source: Ambulatory Visit | Attending: Nurse Practitioner | Admitting: Nurse Practitioner

## 2021-08-25 VITALS — BP 106/70 | HR 94 | Ht 71.0 in | Wt 205.0 lb

## 2021-08-25 DIAGNOSIS — R140 Abdominal distension (gaseous): Secondary | ICD-10-CM | POA: Diagnosis not present

## 2021-08-25 DIAGNOSIS — C220 Liver cell carcinoma: Secondary | ICD-10-CM | POA: Diagnosis not present

## 2021-08-25 DIAGNOSIS — M7989 Other specified soft tissue disorders: Secondary | ICD-10-CM | POA: Insufficient documentation

## 2021-08-25 DIAGNOSIS — K7031 Alcoholic cirrhosis of liver with ascites: Secondary | ICD-10-CM

## 2021-08-25 DIAGNOSIS — M47816 Spondylosis without myelopathy or radiculopathy, lumbar region: Secondary | ICD-10-CM | POA: Diagnosis not present

## 2021-08-25 DIAGNOSIS — K6389 Other specified diseases of intestine: Secondary | ICD-10-CM | POA: Diagnosis not present

## 2021-08-25 LAB — BASIC METABOLIC PANEL
BUN: 18 mg/dL (ref 6–23)
CO2: 28 mEq/L (ref 19–32)
Calcium: 8.4 mg/dL (ref 8.4–10.5)
Chloride: 106 mEq/L (ref 96–112)
Creatinine, Ser: 0.9 mg/dL (ref 0.40–1.50)
GFR: 80.63 mL/min (ref >60.00)
Glucose, Bld: 128 mg/dL — ABNORMAL HIGH (ref 70–99)
Potassium: 4.3 mEq/L (ref 3.5–5.1)
Sodium: 140 mEq/L (ref 135–145)

## 2021-08-25 LAB — HEPATIC FUNCTION PANEL
ALT: 14 U/L (ref 0–53)
AST: 37 U/L (ref 0–37)
Albumin: 2.9 g/dL — ABNORMAL LOW (ref 3.5–5.2)
Alkaline Phosphatase: 146 U/L — ABNORMAL HIGH (ref 39–117)
Bilirubin, Direct: 0.5 mg/dL — ABNORMAL HIGH (ref 0.0–0.3)
Total Bilirubin: 1.2 mg/dL (ref 0.2–1.2)
Total Protein: 5.9 g/dL — ABNORMAL LOW (ref 6.0–8.3)

## 2021-08-25 LAB — CBC WITH DIFFERENTIAL/PLATELET
Basophils Absolute: 0 10*3/uL (ref 0.0–0.1)
Basophils Relative: 0.6 % (ref 0.0–3.0)
Eosinophils Absolute: 0 10*3/uL (ref 0.0–0.7)
Eosinophils Relative: 1.2 % (ref 0.0–5.0)
HCT: 29.7 % — ABNORMAL LOW (ref 39.0–52.0)
Hemoglobin: 9.8 g/dL — ABNORMAL LOW (ref 13.0–17.0)
Lymphocytes Relative: 14.5 % (ref 12.0–46.0)
Lymphs Abs: 0.6 10*3/uL — ABNORMAL LOW (ref 0.7–4.0)
MCHC: 33.1 g/dL (ref 30.0–36.0)
MCV: 104 fl — ABNORMAL HIGH (ref 78.0–100.0)
Monocytes Absolute: 0.3 10*3/uL (ref 0.1–1.0)
Monocytes Relative: 7.7 % (ref 3.0–12.0)
Neutro Abs: 3.1 10*3/uL (ref 1.4–7.7)
Neutrophils Relative %: 76 % (ref 43.0–77.0)
Platelets: 78 10*3/uL — ABNORMAL LOW (ref 150.0–400.0)
RBC: 2.85 Mil/uL — ABNORMAL LOW (ref 4.22–5.81)
RDW: 17.9 % — ABNORMAL HIGH (ref 11.5–15.5)
WBC: 4.1 10*3/uL (ref 4.0–10.5)

## 2021-08-25 LAB — PROTIME-INR
INR: 1.2 ratio — ABNORMAL HIGH (ref 0.8–1.0)
Prothrombin Time: 13.5 s — ABNORMAL HIGH (ref 9.6–13.1)

## 2021-08-25 MED ORDER — SPIRONOLACTONE 50 MG PO TABS: 50.0000 mg | ORAL_TABLET | Freq: Every day | ORAL | 1 refills | Status: DC

## 2021-08-25 MED ORDER — FUROSEMIDE 20 MG PO TABS: 20.0000 mg | ORAL_TABLET | Freq: Every day | ORAL | 1 refills | Status: DC

## 2021-08-25 NOTE — Progress Notes (Signed)
08/25/2021 [name] [id number] [birth year]   Chief Complaint: Abdominal swelling, SOB  History of Present Illness: [name]. [name] is an 80 year old male with a past medical history of CHF, diabetes mellitus type 2, anemia, thrombocytopenia, cirrhosis, HCC s/p SBRT. He was last seen in office by Dr. Hilarie Fredrickson on 08/05/2021 following his hospital admission 10/3 - 07/19/2021 for diarrhea. At the time of his office visit 10/27,  his diarrhea abated and biopsies of his colonoscopy were reviewed with the patient and family, no evidence of microscopic colitis.  He did not have significant ascites at that time and Dr. Hilarie Fredrickson considered starting low-dose Lasix and Aldactone if he developed significant ascites. He presents to our office today for further evaluation regarding abdominal swelling which has progressively worsened since he was discharged from the hospital with associated SOB and a little twinge of chest pain a times, no current chest pain.  He has shortness of breath with exertion and has difficulty taking a deep breath in which she attributes to having a swollen abdomen. He denies ever having a paracentesis. He saw his cardiology NP yesterday who prescribed Lasix but he did not wish to start it until he was seen in our office today.  A repeat ECHO was scheduled.  No significant weight gain.  His appetite remains decreased.  He remains compliant with a low-sodium diet.  No nausea or vomiting.  No heartburn or dysphagia.  No upper or lower abdominal pain.  He is passing a normal formed brown bowel movement once daily or every other day.  No rectal bleeding or black stools.  No overt confusion.  On exam his RLE was moderately erythematous with significant edema. RLE > LLE.  See photo below. He reported having a past LE DVT 30+ years ago, further details are unclear.       Most recent endoscopic procedures:  EGD on 07/13/2021 showed small varices, portal hypertensive gastropathy and an  atypical appearing flat lesion in the stomach with raised borders.  This was biopsied.  Pathology = duodenal biopsies normal.  Stomach flat lesion scant gastric mucosa with acute inflammation and a few moderately atypical glands/cell clusters (comment: Changes may represent reactive atypia but a neoplastic process cannot be ruled out, mainly due to the scant nature of the biopsy); stomach biopsies parietal cell hyperplasia without H. pylori.   Colonoscopy on 07/13/2021 showed severe diverticulosis but was otherwise normal.  Biopsies negative for microscopic colitis.  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  MRI abdomen without contrast 07/03/2021 IMPRESSION: 1. Treated lesion within segment 4A is again noted. Mildly decreased in size in the interval. Persistent rim arterial phase hyperenhancement with diminished internal enhancement compatible with residual viable tumor. 2. Unchanged size of LR-5 lesion within lateral segment of left hepatic lobe compatible with hepatocellular carcinoma. 3. Cirrhosis with splenomegaly. 4. Interval increase in volume of abdominopelvic ascites.  Current Outpatient Medications on File Prior to Visit  Medication Sig Dispense Refill   albuterol (VENTOLIN HFA) 108 (90 Base) MCG/ACT inhaler Inhale 2 puffs into the lungs every 6 (six) hours as needed for wheezing or shortness of breath. 6.7 g 3   Alcohol Swabs (DROPSAFE ALCOHOL PREP) 70 % PADS USE  UP  TO FOUR TIMES DAILY AS DIRECTED 100 each 3   aspirin EC 81 MG tablet Take 81 mg by mouth daily.     blood glucose meter kit and supplies KIT Dispense based on patient and insurance preference. Use up to four times  daily as directed. 1 each 0   Blood Glucose Monitoring Suppl (TRUE METRIX AIR GLUCOSE METER) w/Device KIT Use to check blood sugars daily. Dx: E11.9 1 kit 3   busPIRone (BUSPAR) 7.5 MG tablet Take 1 tablet (7.5 mg total) by mouth 2 (two) times daily as needed. for anxiety (Patient taking differently: Take 7.5 mg  by mouth at bedtime.) 180 tablet 3   carvedilol (COREG) 6.25 MG tablet TAKE 1 TABLET TWICE DAILY (DOSE INCREASE) (Patient taking differently: Take 6.25 mg by mouth 2 (two) times daily with a meal.) 180 tablet 3   ENTRESTO 49-51 MG TAKE 1 TABLET TWICE DAILY (Patient taking differently: Take 1 tablet by mouth 2 (two) times daily.) 180 tablet 3   escitalopram (LEXAPRO) 5 MG tablet Take 1 tablet (5 mg total) by mouth daily. 90 tablet 3   finasteride (PROSCAR) 5 MG tablet TAKE 1 TABLET EVERY DAY 90 tablet 1   gabapentin (NEURONTIN) 300 MG capsule TAKE 1 CAPSULE TWICE DAILY (Patient taking differently: Take 300 mg by mouth 2 (two) times daily.) 180 capsule 3   glucose blood (TRUE METRIX BLOOD GLUCOSE TEST) test strip TEST BLOOD SUGAR  UP  TO FOUR TIMES DAILY AS DIRECTED 100 strip 1   loperamide (IMODIUM) 2 MG capsule Take 1 capsule (2 mg total) by mouth 3 (three) times daily between meals as needed for diarrhea or loose stools. 30 capsule 0   Multiple Vitamin (MULTIVITAMIN WITH MINERALS) TABS tablet Take 1 tablet by mouth daily. 30 tablet 0   Polyvinyl Alcohol-Povidone (REFRESH OP) Place 1 drop into both eyes at bedtime.     rosuvastatin (CRESTOR) 10 MG tablet TAKE 1 TABLET EVERY DAY 90 tablet 3   tamsulosin (FLOMAX) 0.4 MG CAPS capsule TAKE 1 CAPSULE EVERY DAY (Patient taking differently: Take 0.4 mg by mouth daily.) 90 capsule 1   TRUEplus Lancets 28G MISC TEST BLOOD SUGAR  UP  TO FOUR TIMES DAILY AS DIRECTED 100 each 3   No current facility-administered medications on file prior to visit.   No Known Allergies  Current Medications, Allergies, Past Medical History, Past Surgical History, Family History and Social History were reviewed in [employer] record.  Review of Systems:   Constitutional: Negative for fever, sweats, chills or weight loss.  Respiratory: Negative for shortness of breath.   Cardiovascular: Negative for chest pain, palpitations and leg swelling.   Gastrointestinal: See HPI.  Musculoskeletal: Negative for back pain or muscle aches.  Neurological: Negative for dizziness, headaches or paresthesias.   Physical Exam: BP 106/70   Pulse 94   Ht _0  (1.803 m)   Wt 205 lb (93 kg)   BMI 28.59 kg/m  Wt Readings from Last 3 Encounters:  08/25/21 205 lb (93 kg)  08/24/21 205 lb 9.6 oz (93.3 kg)  08/05/21 205 lb (93 kg)    General: 80 year old male presents in a wheelchair in no acute distress. Head: Normocephalic and atraumatic. Eyes: No scleral icterus. Conjunctiva pink . Ears: Normal auditory acuity. Mouth: Absent dentition.  No ulcers or lesions.  Lungs: Clear throughout to auscultation. Heart: Regular rate and rhythm, no murmur. Abdomen: Abdomen with ascites with gaseous distention component.  Abdomen is not tense.  Epigastric tenderness without rebound or guarding.  Upper abdomen tympanic to percussion, lower abdomen dull to percussion.  No masses or hepatomegaly. Normal bowel sounds x 4 quadrants.  Rectal: Deferred. Musculoskeletal: Symmetrical with no gross deformities. Extremities: RLE with significant edema and erythema, RLE > LLE.  See  photo above.  Negative Homans' sign.  Neurological: Alert oriented x 4. No focal deficits.  No asterixis. Psychological: Alert and cooperative. Normal mood and affect  Assessment and Recommendations:  78) 80 year old male with decompensated cirrhosis with ascites, portal hypertension and HCC s/p SBRT.  No recent increase in ascites with gaseous abdominal distention resulting in shortness of breath.  No overt encephalopathy. -Start Furosemide 20 mg 1 p.o. daily and Spironolactone 50 mg 1 tab p.o. daily  -BMP, hepatic panel, CBC and INR -Therapeutic paracentesis to be scheduled after the above lab results reviewed -KUB to rule out gaseous distention, ileus/developing bowel obstruction  2) RLE with significant edema and erythema concerning for possible DVT.  Patient and family hoping to avoid  ED evaluation. -Stat RLE Doppler study scheduled at [facility] at 4:30 PM today.  If Doppler study positive for DVT he will need to present to the ED for further evaluation including chest CTA to rule out PE -Patient aware to present to the ED if he develops chest pain, worsening shortness of breath or severe abdominal pain  3) Gastritis/atypical gastric lesion  -Follow-up with Dr. Hilarie Fredrickson in 3 months as previously discussed to consider eventual follow-up EGD  4) CHF followed by cardiology. LV EF 50 to 55% per ECHO 06/2020. Repeat ECHO ordered, not yet completed   I consulted with Dr. Hilarie Fredrickson and he agreed with the above plan.

## 2021-08-25 NOTE — Patient Instructions (Addendum)
LABS:  Lab work has been ordered for you today. Our lab is located in the basement. Press "B" on the elevator. The lab is located at the first door on the left as you exit the elevator.  HEALTHCARE LAWS AND MY CHART RESULTS: Due to recent changes in healthcare laws, you may see the results of your imaging and laboratory studies on MyChart before your provider has had a chance to review them.   We understand that in some cases there may be results that are confusing or concerning to you. Not all laboratory results come back in the same time frame and the provider may be waiting for multiple results in order to interpret others.  Please give [place] 48 hours in order for your provider to thoroughly review all the results before contacting the office for clarification of your results.   IMAGING Your provider has requested that you have an abdominal x ray before leaving today. Please go to the basement floor to our Radiology department for the test.  MEDICATION: We have sent the following medication to your pharmacy for you to pick up at your convenience: Lasix 20 MG tablet once a day. Spironolactone 50 MG tablet once a day.  We have you scheduled for the ultrasound to rule out a blood clot at [facility] at 4:30 pm. Go to Entrance C Heart and Vascular.  GO TO THE EMERGENCY ROOM IF YOU DEVELOP WORSENING SHORTNESS OF BREATH OR IF YOU DEVELOP CHEST PAIN OR SEVERE ABDOMINAL PAIN.   Our office will be contacting you to schedule a paracentesis.  It was great seeing you today! Thank you for entrusting me with your care and choosing [facility].  Noralyn Pick, CRNP

## 2021-08-25 NOTE — Progress Notes (Signed)
Lower extremity venous right study completed. Study completed after business hours.   Please see CV Proc for preliminary results.   Darlin Coco, RDMS, RVT

## 2021-08-26 ENCOUNTER — Other Ambulatory Visit: Payer: Self-pay

## 2021-08-26 ENCOUNTER — Telehealth: Payer: Self-pay | Admitting: Nurse Practitioner

## 2021-08-26 DIAGNOSIS — I10 Essential (primary) hypertension: Secondary | ICD-10-CM

## 2021-08-26 DIAGNOSIS — K7031 Alcoholic cirrhosis of liver with ascites: Secondary | ICD-10-CM

## 2021-08-26 DIAGNOSIS — C220 Liver cell carcinoma: Secondary | ICD-10-CM

## 2021-08-26 NOTE — Telephone Encounter (Signed)
Spoke with the patient. Informed of the plan. He will not be able to have labs next week due to the holiday. He will come on Monday 09/06/21. Paracentesis on 08/30/21 [place]. Agrees to arrive at 1:45pm.

## 2021-08-26 NOTE — Telephone Encounter (Signed)
[name] pls contact the patient and let him know is abd xray showed a moderate amount of stool in the colon. Dr. Hilarie Fredrickson reviewed the xray film and recommended the following:  Miralax 8 doses in 32 oz of Gatorade over 3-4 hours as tolerated. Dulcolax supp x 1 tonight. Patient to contact our office tomorrow with further update. Thx

## 2021-08-26 NOTE — Telephone Encounter (Signed)
[name], pls contact the patient and let him know his RLE doppler study fortunately did not show any evidence of a DVT.  His labs are stable. He should take Furosemide 20mg  one po QD and Aldactone 50mg  one tab every morning as directed yesterday.   Pls send him back to our lab in one week to repeat a BMP.  Pls schedule him for a therapeutic paracentesis. Max peritoneal fluid to remove 5L. He probably has less than 5L of ascites.   Albumin IV per radiology protocol.  Paracentesis labs to include cell count with diff, gram stain, albumin, protein, aerobic and aerobic cultures.   No cytology as we know he has a history of [place].   Patient to go to ED if he develops worsening SOB, if he develops CP or severe abdominal pain.  Let him know his abd xray result is pending, I will contact him when result received     THX

## 2021-08-26 NOTE — Progress Notes (Signed)
Addendum: Reviewed and agree with assessment and management plan. Outpt STAT US r/o DVT LVP if possible Titration of diuretics with attn to renal function Close followup recommended. Zakery Normington, Lajuan Lines, MD

## 2021-08-27 ENCOUNTER — Telehealth: Payer: Self-pay | Admitting: Pharmacist

## 2021-08-27 NOTE — Telephone Encounter (Signed)
Reviewed the purge with the daughter.

## 2021-08-27 NOTE — Telephone Encounter (Signed)
Patient is aware 

## 2021-08-27 NOTE — Chronic Care Management (AMB) (Signed)
Chronic Care Management Pharmacy Assistant   Name: [name]  MRN: [id number] DOB: 07/21/1941   Reason for Encounter: Diabetes Adherence Call    Recent office visits:  07/28/2021 OV (PCP) [name], MD; no medication changes indicated.  Recent consult visits:  08/25/2021 OV (gastro) Noralyn Pick, NP; Outpt STAT US r/o DVT, LVP if possible, Titration of diuretics with attn to renal function  08/24/2021 OV (cardiology) Verta Ellen., NP; gastroenterologist suggested adding Lasix or spironolactone to help with ascites.  We are starting Lasix 20 mg daily to help.  08/05/2021 OV (gastro) Pyrtle, Lajuan Lines, MD; no medication changes indicated.  Hospital visits:  No hospital visits since last adherence call   Medications: Outpatient Encounter Medications as of 08/27/2021  Medication Sig   albuterol (VENTOLIN HFA) 108 (90 Base) MCG/ACT inhaler Inhale 2 puffs into the lungs every 6 (six) hours as needed for wheezing or shortness of breath.   Alcohol Swabs (DROPSAFE ALCOHOL PREP) 70 % PADS USE  UP  TO FOUR TIMES DAILY AS DIRECTED   aspirin EC 81 MG tablet Take 81 mg by mouth daily.   blood glucose meter kit and supplies KIT Dispense based on patient and insurance preference. Use up to four times daily as directed.   Blood Glucose Monitoring Suppl (TRUE METRIX AIR GLUCOSE METER) w/Device KIT Use to check blood sugars daily. Dx: E11.9   busPIRone (BUSPAR) 7.5 MG tablet Take 1 tablet (7.5 mg total) by mouth 2 (two) times daily as needed. for anxiety (Patient taking differently: Take 7.5 mg by mouth at bedtime.)   carvedilol (COREG) 6.25 MG tablet TAKE 1 TABLET TWICE DAILY (DOSE INCREASE) (Patient taking differently: Take 6.25 mg by mouth 2 (two) times daily with a meal.)   ENTRESTO 49-51 MG TAKE 1 TABLET TWICE DAILY (Patient taking differently: Take 1 tablet by mouth 2 (two) times daily.)   escitalopram (LEXAPRO) 5 MG tablet Take 1 tablet (5 mg total) by mouth  daily.   finasteride (PROSCAR) 5 MG tablet TAKE 1 TABLET EVERY DAY   furosemide (LASIX) 20 MG tablet Take 1 tablet (20 mg total) by mouth daily.   gabapentin (NEURONTIN) 300 MG capsule TAKE 1 CAPSULE TWICE DAILY (Patient taking differently: Take 300 mg by mouth 2 (two) times daily.)   glucose blood (TRUE METRIX BLOOD GLUCOSE TEST) test strip TEST BLOOD SUGAR  UP  TO FOUR TIMES DAILY AS DIRECTED   loperamide (IMODIUM) 2 MG capsule Take 1 capsule (2 mg total) by mouth 3 (three) times daily between meals as needed for diarrhea or loose stools.   Multiple Vitamin (MULTIVITAMIN WITH MINERALS) TABS tablet Take 1 tablet by mouth daily.   Polyvinyl Alcohol-Povidone (REFRESH OP) Place 1 drop into both eyes at bedtime.   rosuvastatin (CRESTOR) 10 MG tablet TAKE 1 TABLET EVERY DAY   spironolactone (ALDACTONE) 50 MG tablet Take 1 tablet (50 mg total) by mouth daily.   tamsulosin (FLOMAX) 0.4 MG CAPS capsule TAKE 1 CAPSULE EVERY DAY (Patient taking differently: Take 0.4 mg by mouth daily.)   TRUEplus Lancets 28G MISC TEST BLOOD SUGAR  UP  TO FOUR TIMES DAILY AS DIRECTED   No facility-administered encounter medications on file as of 08/27/2021.     None below 70 - has swelling in one of his legs, has ulcers on one foot. Has to get some fluid drained off of his stomach.  Recent Relevant Labs: Lab Results  Component Value Date/Time   HGBA1C 5.2 07/13/2021 03:18  AM   HGBA1C 6.5 (A) 12/15/2020 02:54 PM   HGBA1C 5.9 (A) 02/25/2020 01:37 PM   HGBA1C 7.1 (H) 10/22/2018 02:51 PM   MICROALBUR 60.3 (H) 01/29/2018 12:52 PM   MICROALBUR 7.8 (H) 12/01/2015 09:40 AM    Kidney Function Lab Results  Component Value Date/Time   CREATININE 0.90 08/25/2021 02:37 PM   CREATININE 0.86 07/28/2021 12:06 PM   CREATININE 0.98 07/06/2021 01:19 PM   CREATININE 1.15 04/06/2021 08:57 AM   GFR 80.63 08/25/2021 02:37 PM   GFRNONAA >60 07/19/2021 12:11 AM   GFRNONAA >60 07/06/2021 01:19 PM   GFRAA 59 (L) 06/09/2020 08:59  AM    Current antihyperglycemic regimen:  None at the moment  What recent interventions/DTPs have been made to improve glycemic control:  No recent interventions or DTPs.  Have there been any recent hospitalizations or ED visits since last visit with CPP? Yes, patient had hospital visit in early October. Chronic diarrhea leading to generalized weakness with concern also for pneumonia and UTI leading to hospitalization.  Patient denies hypoglycemic symptoms.  Patient denies hyperglycemic symptoms.  How often are you checking your blood sugar? once daily  What are your blood sugars ranging?  Fasting: 138, 140  During the week, how often does your blood glucose drop below 70? Never  Are you checking your feet daily/regularly? Patient states he has an ulcer on one of his feet now that is he currently under the care for. He states he also has a lot of swelling in the same limb. He said he has some significant shortness of breath and is hardly able to get around or do what needs to be done.  Adherence Review: Is the patient currently on a STATIN medication? Yes Is the patient currently on ACE/ARB medication? Yes Does the patient have >5 day gap between last estimated fill dates? No   Care Gaps: Medicare Annual Wellness: Completed Ophthalmology Exam: Completed ~6 months ago per patient Foot Exam:  Next due on 05/14/2022 Hemoglobin A1C: 6.5% on 12/15/2020   Future Appointments  Date Time Provider [place]  08/30/2021  2:00 PM [facility]-IR 3 [facility]  09/10/2021  2:30 PM [facility] HEALTH COACH [facility]  09/14/2021  1:40 PM [name], MD [facility]  09/20/2021 10:45 AM Marzetta Board, DPM [facility]  10/06/2021  3:00 PM AP - ECHO 1 OUTPATIENT [facility]-CARDIOPUL [place] [facility]  10/13/2021  9:30 AM [facility] LAB [facility] None  10/13/2021 10:00 AM Brunetta Genera, MD [facility] None  11/01/2021  3:40 PM [name], MD [facility]  11/10/2021 10:10 AM  Pyrtle, Lajuan Lines, MD [facility]  11/10/2021  3:20 PM Satira Sark, MD [facility]     Star Rating Drugs: Rosuvastatin 10 mg last filled 05/22/2021 90 DS Metformin 500 mg last filled 05/28/2021 90 DS  April D Calhoun, [place] Pharmacist Assistant [phone]

## 2021-08-27 NOTE — Telephone Encounter (Signed)
Patients daughter and wife called regarding medication instructions states the patient is confused on what he should be doing.

## 2021-08-30 ENCOUNTER — Other Ambulatory Visit: Payer: Self-pay

## 2021-08-30 ENCOUNTER — Telehealth: Payer: Self-pay | Admitting: Nurse Practitioner

## 2021-08-30 ENCOUNTER — Ambulatory Visit (HOSPITAL_COMMUNITY)
Admission: RE | Admit: 2021-08-30 | Discharge: 2021-08-30 | Disposition: A | Discharge status: Home / Self Care | Payer: Medicare HMO | Source: Ambulatory Visit | Attending: Nurse Practitioner | Admitting: Nurse Practitioner

## 2021-08-30 DIAGNOSIS — C220 Liver cell carcinoma: Secondary | ICD-10-CM | POA: Insufficient documentation

## 2021-08-30 DIAGNOSIS — R188 Other ascites: Secondary | ICD-10-CM | POA: Diagnosis not present

## 2021-08-30 DIAGNOSIS — K7031 Alcoholic cirrhosis of liver with ascites: Secondary | ICD-10-CM | POA: Diagnosis not present

## 2021-08-30 HISTORY — PX: IR PARACENTESIS: IMG2679

## 2021-08-30 LAB — GRAM STAIN

## 2021-08-30 LAB — BODY FLUID CELL COUNT WITH DIFFERENTIAL
Eos, Fluid: 0 %
Lymphs, Fluid: 76 %
Monocyte-Macrophage-Serous Fluid: 23 % — ABNORMAL LOW (ref 50–90)
Neutrophil Count, Fluid: 2 % (ref 0–25)
Total Nucleated Cell Count, Fluid: 65 cu mm (ref 0–1000)

## 2021-08-30 LAB — ALBUMIN, PLEURAL OR PERITONEAL FLUID: Albumin, Fluid: <1.5 g/dL

## 2021-08-30 LAB — PROTEIN, PLEURAL OR PERITONEAL FLUID: Total protein, fluid: <3 g/dL

## 2021-08-30 MED ORDER — LIDOCAINE HCL 1 % IJ SOLN
INTRAMUSCULAR | Status: AC
  Filled 2021-08-30: qty 20

## 2021-08-30 MED ORDER — LIDOCAINE HCL (PF) 1 % IJ SOLN
INTRAMUSCULAR | Status: DC | PRN
  Administered 2021-08-30: 5 mL

## 2021-08-30 NOTE — Procedures (Signed)
PROCEDURE SUMMARY:  Successful US guided paracentesis from right abdomen.  Yielded 5 L of clear yellow fluid.  No immediate complications.  Pt tolerated well.   Specimen sent for labs.  EBL < 2 mL  Theresa Duty, NP 08/30/2021 4:39 PM

## 2021-08-30 NOTE — Telephone Encounter (Signed)
[name], pls contact patient as he was instructed to call our office with an update last week after taking Miralax which was not done. Pls verify if he responded to Miralax instructions and if he is still having significant abdominal distension or any SOB.  Remind him also to have BMP this week as previously ordered. thx

## 2021-08-30 NOTE — Telephone Encounter (Signed)
Unable to reach patient.

## 2021-08-31 ENCOUNTER — Telehealth: Payer: Self-pay

## 2021-08-31 NOTE — Telephone Encounter (Addendum)
Called and spoke with the spouse. She will remind the patient to come in today for non-fasting labs before 5:00 pm. She reports he has not complained of any abdominal distention, but will check with him again when he wakes up.

## 2021-08-31 NOTE — Telephone Encounter (Signed)
Patient has called back in regard?  °

## 2021-08-31 NOTE — Telephone Encounter (Signed)
Notified patient daughter [name] that no one called from [facility] regarding a appt

## 2021-08-31 NOTE — Telephone Encounter (Signed)
Reminder call to come for labs today. Order is in [place]. Non-fasting labs.

## 2021-08-31 NOTE — Telephone Encounter (Signed)
Patient called in stating they were just talking with someone about how Dr.Hunter wanted to work [name] in for an appointment today. Do not see any documentation, please advise.

## 2021-09-01 ENCOUNTER — Telehealth: Payer: Self-pay

## 2021-09-01 LAB — PATHOLOGIST SMEAR REVIEW: Path Review: INCREASED

## 2021-09-01 NOTE — Telephone Encounter (Signed)
Patient scheduled for f/u appointment on 09/16/21 at [place] pt to let him know.

## 2021-09-04 LAB — CULTURE, BODY FLUID W GRAM STAIN -BOTTLE: Culture: NO GROWTH

## 2021-09-06 ENCOUNTER — Other Ambulatory Visit: Payer: Self-pay | Admitting: Cardiology

## 2021-09-06 NOTE — Telephone Encounter (Signed)
Called the patient. He has forgotten to come get his labs. He tells me he will come tomorrow for the labs.

## 2021-09-07 ENCOUNTER — Other Ambulatory Visit (INDEPENDENT_AMBULATORY_CARE_PROVIDER_SITE_OTHER): Payer: Medicare HMO

## 2021-09-07 DIAGNOSIS — K7031 Alcoholic cirrhosis of liver with ascites: Secondary | ICD-10-CM | POA: Diagnosis not present

## 2021-09-07 LAB — BASIC METABOLIC PANEL
BUN: 15 mg/dL (ref 6–23)
CO2: 31 mEq/L (ref 19–32)
Calcium: 8.7 mg/dL (ref 8.4–10.5)
Chloride: 105 mEq/L (ref 96–112)
Creatinine, Ser: 1.04 mg/dL (ref 0.40–1.50)
GFR: 67.77 mL/min (ref >60.00)
Glucose, Bld: 110 mg/dL — ABNORMAL HIGH (ref 70–99)
Potassium: 4.6 mEq/L (ref 3.5–5.1)
Sodium: 140 mEq/L (ref 135–145)

## 2021-09-09 ENCOUNTER — Telehealth: Payer: Self-pay | Admitting: Cardiology

## 2021-09-09 MED ORDER — ENTRESTO 49-51 MG PO TABS: 1.0000 | ORAL_TABLET | Freq: Two times a day (BID) | ORAL | 0 refills | Status: DC

## 2021-09-09 NOTE — Telephone Encounter (Signed)
Patient notified - samples ready for pick up.  States he is in the dough-nut whole.  Insurance will start over first of the year.

## 2021-09-09 NOTE — Telephone Encounter (Signed)
Patient calling the office for samples of medication:   1.  What medication and dosage are you requesting samples for? ENTRESTO 49-51 MG  2.  Are you currently out of this medication? Pt has only 1 pill left

## 2021-09-09 NOTE — Addendum Note (Signed)
Addended by: Laurine Blazer on: 09/09/2021 12:05 PM   Modules accepted: Orders

## 2021-09-10 ENCOUNTER — Other Ambulatory Visit: Payer: Self-pay

## 2021-09-10 ENCOUNTER — Ambulatory Visit (INDEPENDENT_AMBULATORY_CARE_PROVIDER_SITE_OTHER): Payer: Medicare HMO

## 2021-09-10 VITALS — BP 130/72 | HR 99 | Temp 97.8°F | Wt 196.4 lb

## 2021-09-10 DIAGNOSIS — Z0000 Encounter for general adult medical examination without abnormal findings: Secondary | ICD-10-CM | POA: Diagnosis not present

## 2021-09-10 NOTE — Progress Notes (Addendum)
Subjective:   [name] is a 80 y.o. male who presents for Medicare Annual/Subsequent preventive examination.  Review of Systems     Cardiac Risk Factors include: advanced age (>34mn, >>26women);hypertension;dyslipidemia;male gender;diabetes mellitus     Objective:    Today's Vitals   09/10/21 1357  BP: 130/72  Pulse: 99  Temp: 97.8 F (36.6 C)  SpO2: 97%  Weight: 196 lb 6.4 oz (89.1 kg)  PainSc: 7    Body mass index is 27.39 kg/m.  Advanced Directives 09/10/2021 07/14/2021 07/12/2021 05/03/2021 08/10/2020 08/06/2020 08/03/2020  Does Patient Have a Medical Advance Directive? Yes - No No No No Yes  Does patient want to make changes to medical advance directive? Yes (MAU/Ambulatory/Procedural Areas - Information given) - - No - Patient declined - Yes (MAU/Ambulatory/Procedural Areas - Information given) Yes (MAU/Ambulatory/Procedural Areas - Information given)  Would patient like information on creating a medical advance directive? - No - Patient declined - No - Patient declined No - Patient declined No - Patient declined -    Current Medications (verified) Outpatient Encounter Medications as of 09/10/2021  Medication Sig   albuterol (VENTOLIN HFA) 108 (90 Base) MCG/ACT inhaler Inhale 2 puffs into the lungs every 6 (six) hours as needed for wheezing or shortness of breath.   Alcohol Swabs (DROPSAFE ALCOHOL PREP) 70 % PADS USE  UP  TO FOUR TIMES DAILY AS DIRECTED   aspirin EC 81 MG tablet Take 81 mg by mouth daily.   blood glucose meter kit and supplies KIT Dispense based on patient and insurance preference. Use up to four times daily as directed.   Blood Glucose Monitoring Suppl (TRUE METRIX AIR GLUCOSE METER) w/Device KIT Use to check blood sugars daily. Dx: E11.9   busPIRone (BUSPAR) 7.5 MG tablet Take 1 tablet (7.5 mg total) by mouth 2 (two) times daily as needed. for anxiety (Patient taking differently: Take 7.5 mg by mouth at bedtime.)   carvedilol (COREG) 6.25 MG tablet  Take 1 tablet (6.25 mg total) by mouth 2 (two) times daily with a meal.   escitalopram (LEXAPRO) 5 MG tablet Take 1 tablet (5 mg total) by mouth daily.   finasteride (PROSCAR) 5 MG tablet TAKE 1 TABLET EVERY DAY   furosemide (LASIX) 20 MG tablet Take 1 tablet (20 mg total) by mouth daily.   gabapentin (NEURONTIN) 300 MG capsule TAKE 1 CAPSULE TWICE DAILY (Patient taking differently: Take 300 mg by mouth 2 (two) times daily.)   glucose blood (TRUE METRIX BLOOD GLUCOSE TEST) test strip TEST BLOOD SUGAR  UP  TO FOUR TIMES DAILY AS DIRECTED   loperamide (IMODIUM) 2 MG capsule Take 1 capsule (2 mg total) by mouth 3 (three) times daily between meals as needed for diarrhea or loose stools.   Multiple Vitamin (MULTIVITAMIN WITH MINERALS) TABS tablet Take 1 tablet by mouth daily.   Polyvinyl Alcohol-Povidone (REFRESH OP) Place 1 drop into both eyes at bedtime.   rosuvastatin (CRESTOR) 10 MG tablet TAKE 1 TABLET EVERY DAY   sacubitril-valsartan (ENTRESTO) 49-51 MG Take 1 tablet by mouth 2 (two) times daily.   spironolactone (ALDACTONE) 50 MG tablet Take 1 tablet (50 mg total) by mouth daily.   tamsulosin (FLOMAX) 0.4 MG CAPS capsule TAKE 1 CAPSULE EVERY DAY (Patient taking differently: Take 0.4 mg by mouth daily.)   TRUEplus Lancets 28G MISC TEST BLOOD SUGAR  UP  TO FOUR TIMES DAILY AS DIRECTED   No facility-administered encounter medications on file as of 09/10/2021.  Allergies (verified) Patient has no known allergies.   History: Past Medical History:  Diagnosis Date   Benign prostatic hypertrophy    Blood transfusion without reported diagnosis    CHF (congestive heart failure) (HCC)    a. EF 20-25% by echo in 11/2019   COPD (chronic obstructive pulmonary disease) ([place]) 12/15/2009   FEV1 2.30 (70%) ratio 63 no better with B2 and DLCO 18/6 (73%) corrects to 106%   Degenerative joint disease    Depression    Diverticulosis    Esophageal varices (HCC)    Essential hypertension    ACE  inhibitor cough   Fatty liver    Hepatocellular carcinoma (HCC)    Hyperlipidemia    Internal hemorrhoids    Liver cancer ([place])    Low back pain    Otitis externa 07/30/2012   Portal hypertensive gastropathy (HCC)    Splenomegaly    Thrombocytopenia (HCC)    Type 2 diabetes mellitus ([place])    Ulcer of foot ([place])    Right foot   Past Surgical History:  Procedure Laterality Date   BIOPSY  07/14/2021   Procedure: BIOPSY;  Surgeon: Daryel November, MD;  Location: [place];  Service: Gastroenterology;;  EGD and COLON   COLONOSCOPY  2020   JMP-MAC-plenvu (good)-polyps-recall 1 yr   COLONOSCOPY WITH PROPOFOL N/A 07/14/2021   Procedure: COLONOSCOPY WITH PROPOFOL;  Surgeon: Daryel November, MD;  [facility] ENDOSCOPY;  Service: Gastroenterology;  Laterality: N/A;   ESOPHAGOGASTRODUODENOSCOPY (EGD) WITH PROPOFOL N/A 07/14/2021   Procedure: ESOPHAGOGASTRODUODENOSCOPY (EGD) WITH PROPOFOL;  Surgeon: Daryel November, MD;  Location: [place];  Service: Gastroenterology;  Laterality: N/A;   IR EMBO TUMOR ORGAN ISCHEMIA INFARCT INC GUIDE ROADMAPPING  06/01/2020   IR PARACENTESIS  08/30/2021   IR RADIOLOGIST EVAL & MGMT  04/28/2020   IR RADIOLOGIST EVAL & MGMT  07/15/2020   IR RADIOLOGIST EVAL & MGMT  05/04/2021   NO PAST SURGERIES  1980   Family History  Problem Relation Age of Onset   Melanoma Mother    Stroke Father    CAD Brother        Premature disease   Colon polyps Neg Hx    Colon cancer Neg Hx    Esophageal cancer Neg Hx    Stomach cancer Neg Hx    Rectal cancer Neg Hx    Social History   Socioeconomic History   Marital status: Unknown    Spouse name: Not on file   Number of children: Not on file   Years of education: Not on file   Highest education level: Not on file  Occupational History   Occupation: retired    Comment: maintenence work  Tobacco Use   Smoking status: Former    Packs/day: 4.00    Years: 20.00    Pack years: 80.00    Types:  Cigarettes    Quit date: 09/18/1984    Years since quitting: 37.0   Smokeless tobacco: Never   Tobacco comments:    quit in 1980  Vaping Use   Vaping Use: Never used  Substance and Sexual Activity   Alcohol use: No    Comment: no drinking since 40 years plus   Drug use: No   Sexual activity: Yes  Other Topics Concern   Not on file  Social History Narrative   ** Merged History Encounter **       Married 33 years in 2015. 4 kids (2 sons) and 3 grandkids.  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            Social Determinants of Radio broadcast assistant Strain: Low Risk    Difficulty of Paying Living Expenses: Not hard at all  Food Insecurity: No Food Insecurity   Worried About Charity fundraiser in the Last Year: Never true   Arboriculturist in the Last Year: Never true  Transportation Needs: No Transportation Needs   Lack of Transportation (Medical): No   Lack of Transportation (Non-Medical): No  Physical Activity: Inactive   Days of Exercise per Week: 0 days   Minutes of Exercise per Session: 0 min  Stress: No Stress Concern Present   Feeling of Stress : Only a little  Social Connections: Moderately Integrated   Frequency of Communication with Friends and Family: More than three times a week   Frequency of Social Gatherings with Friends and Family: More than three times a week   Attends Religious Services: More than 4 times per year   Active Member of [employer] or Organizations: No   Attends Music therapist: Never   Marital Status: Married    Tobacco Counseling Counseling given: Not Answered Tobacco comments: quit in [place]:  Pre-visit preparation completed: Yes  Pain : 0-10 Pain Score: 7  Pain Type: Chronic pain Pain Location: Generalized Pain Descriptors / Indicators: Aching Pain Onset: More than a month ago Pain Frequency:  Constant     BMI - recorded: 27.39 Nutritional Status: BMI 25 -29 Overweight Nutritional Risks: None Diabetes: Yes CBG done?: No Did pt. bring in CBG monitor from home?: No  How often do you need to have someone help you when you read instructions, pamphlets, or other written materials from your doctor or pharmacy?: 1 - Never  Diabetic?Nutrition Risk Assessment:  Has the patient had any N/V/D within the last 2 months?  No  Does the patient have any non-healing wounds?  No  Has the patient had any unintentional weight loss or weight gain?  No   Diabetes:  Is the patient diabetic?  Yes  If diabetic, was a CBG obtained today?  No  Did the patient bring in their glucometer from home?  No  How often do you monitor your CBG's? Daily .   Financial Strains and Diabetes Management:  Are you having any financial strains with the device, your supplies or your medication? No .  Does the patient want to be seen by Chronic Care Management for management of their diabetes?  No  Would the patient like to be referred to a Nutritionist or for Diabetic Management?  No   Diabetic Exams:  Diabetic Eye Exam: Overdue for diabetic eye exam. Pt has been advised about the importance in completing this exam. Patient advised to call and schedule an eye exam. Diabetic Foot Exam: Completed 05/14/21   Interpreter Needed?: No  Information entered by :: Charlott Rakes, LPN   Activities of Daily Living In your present state of health, do you have any difficulty performing the following activities: 09/10/2021 07/14/2021  Hearing? N N  Vision? N N  Difficulty concentrating or making decisions? N N  Walking or climbing stairs? N N  Dressing or bathing? N N  Doing errands, shopping? N N  Preparing Food and eating ? N -  Using the Toilet? N -  In the past six months, have you accidently leaked urine? Y -  Comment incontinent -  Do you have problems with loss of bowel control? Y -  Comment loose stools  at times -  Managing your Medications? N -  Managing your Finances? N -  Housekeeping or managing your Housekeeping? N -  Some recent data might be hidden    Patient Care Team: [name], MD as PCP - General (Family Medicine) Satira Sark, MD as PCP - Cardiology (Cardiology) Marilynne Halsted, MD as Referring Physician (Ophthalmology) Brunetta Genera, MD as Consulting Physician (Hematology) Pyrtle, Lajuan Lines, MD as Consulting Physician (Gastroenterology) Marzetta Board, DPM as Consulting Physician (Podiatry) Madelin Rear, [facility] as Pharmacist (Pharmacist)  Indicate any recent Medical Services you may have received from other than [facility] providers in the past year (date may be approximate).     Assessment:   This is a routine wellness examination for [employer].  Hearing/Vision screen Hearing Screening - Comments:: Pt denies any hearing issues  Vision Screening - Comments:: Pt follows up with Dr Darnell Level for annual eye exams   Dietary issues and exercise activities discussed: Current Exercise Habits: The patient does not participate in regular exercise at present   Goals Addressed             This Visit's Progress    Patient Stated       Walk without the walker        Depression Screen PHQ 2/9 Scores 09/10/2021 07/28/2021 05/14/2021 12/15/2020 12/15/2020 08/11/2020 08/03/2020  PHQ - 2 Score 1 6 0 0 0 0 2  PHQ- 9 Score 3 12 0 4 - - -    Fall Risk Fall Risk  09/10/2021 05/14/2021 08/03/2020 07/03/2019 01/29/2018  Falls in the past year? _0 No  Number falls in past yr: _1 -  Injury with Fall? _2 -  Comment hurt ankle and knees - - - -  Risk for fall due to : History of fall(s);Impaired balance/gait;Impaired mobility;Impaired vision Impaired balance/gait Impaired mobility;Impaired balance/gait;Impaired vision;History of fall(s);Orthopedic patient History of fall(s) -  Follow up Falls prevention discussed - Falls prevention discussed Education provided;Falls  prevention discussed;Falls evaluation completed -    FALL RISK PREVENTION PERTAINING TO THE HOME:  Any stairs in or around the home? No  If so, are there any without handrails? No  Home free of loose throw rugs in walkways, pet beds, electrical cords, etc? Yes  Adequate lighting in your home to reduce risk of falls? Yes   ASSISTIVE DEVICES UTILIZED TO PREVENT FALLS:  Life alert? No  Use of a cane, walker or w/c? Yes  Grab bars in the bathroom? No  Shower chair or bench in shower? Yes  Elevated toilet seat or a handicapped toilet? No    TIMED UP AND GO:  Was the test performed? Yes .  Length of time to ambulate 10 feet: 20 sec.   Gait slow and steady with assistive device  Cognitive Function: MMSE - Mini Mental State Exam 05/11/2017  Orientation to time 5  Orientation to Place 5  Registration 3  Attention/ Calculation 4  Recall 1  Language- name 2 objects 2  Language- repeat 1  Language- follow 3 step command 3  Language- read & follow direction 1     6CIT Screen 09/10/2021 08/03/2020 07/03/2019 05/11/2017  What Year? 0 points 0 points 0 points 0 points  What month? 0 points 0 points  0 points 0 points  What time? 0 points - 0 points 0 points  Count back from 20 0 points 0 points 0 points 0 points  Months in reverse 4 points 4 points 0 points 2 points  Repeat phrase 2 points 2 points 2 points 4 points  Total Score 6 - 2 6    Immunizations Immunization History  Administered Date(s) Administered   Influenza Split 09/07/2011, 07/30/2012   Influenza Whole 10/10/2005, 08/31/2009, 07/10/2010   Influenza, High Dose Seasonal PF 06/22/2016, 06/29/2017, 09/20/2018, 07/11/2019   Influenza,inj,Quad PF,6+ Mos 07/01/2013, 06/20/2014   Influenza-Unspecified 10/26/2015, 09/12/2018, 08/03/2021   Meningococcal Conjugate 07/24/2014   Moderna Sars-Covid-2 Vaccination 10/22/2019, 12/02/2019, 08/10/2021   Pneumococcal Conjugate-13 07/24/2014   Pneumococcal Polysaccharide-23 08/31/2009    Td 06/10/2006   Zoster Recombinat (Shingrix) 12/11/2018, 05/03/2019    TDAP status: Due, Education has been provided regarding the importance of this vaccine. Advised may receive this vaccine at local pharmacy or Health Dept. Aware to provide a copy of the vaccination record if obtained from local pharmacy or Health Dept. Verbalized acceptance and understanding.  Flu Vaccine status: Up to date  Pneumococcal vaccine status: Up to date  Covid-19 vaccine status: Completed vaccines  Qualifies for Shingles Vaccine? Yes   Zostavax completed Yes   Shingrix Completed?: Yes  Screening Tests Health Maintenance  Topic Date Due   TETANUS/TDAP  06/10/2016   OPHTHALMOLOGY EXAM  01/25/2019   COVID-19 Vaccine (4 - Booster for Moderna series) 10/05/2021   HEMOGLOBIN A1C  01/11/2022   FOOT EXAM  05/14/2022   COLONOSCOPY (Pts 45-67yr Insurance coverage will need to be confirmed)  07/14/2022   Pneumonia Vaccine 80 Years old  Completed   INFLUENZA VACCINE  Completed   Zoster Vaccines- Shingrix  Completed   HPV VACCINES  Aged Out    Health Maintenance  Health Maintenance Due  Topic Date Due   TETANUS/TDAP  06/10/2016   OPHTHALMOLOGY EXAM  01/25/2019    Colorectal cancer screening: Type of screening: Colonoscopy. Completed 07/14/21. Repeat every 1 years   Additional Screening:   Vision Screening: Recommended annual ophthalmology exams for early detection of glaucoma and other disorders of the eye. Is the patient up to date with their annual eye exam?  Yes  Who is the provider or what is the name of the office in which the patient attends annual eye exams? Dr GDarnell LevelIf pt is not established with a provider, would they like to be referred to a provider to establish care? No .   Dental Screening: Recommended annual dental exams for proper oral hygiene  Community Resource Referral / Chronic Care Management: CRR required this visit?  No   CCM required this visit?  No      Plan:     I  have personally reviewed and noted the following in the patient's chart:   Medical and social history Use of alcohol, tobacco or illicit drugs  Current medications and supplements including opioid prescriptions. Patient is not currently taking opioid prescriptions. Functional ability and status Nutritional status Physical activity Advanced directives List of other physicians Hospitalizations, surgeries, and ER visits in previous 12 months Vitals Screenings to include cognitive, depression, and falls Referrals and appointments  In addition, I have reviewed and discussed with patient certain preventive protocols, quality metrics, and best practice recommendations. A written personalized care plan for preventive services as well as general preventive health recommendations were provided to patient.     TWillette Brace LPN   [id number]  Nurse Notes: None

## 2021-09-10 NOTE — Patient Instructions (Addendum)
[name] , Thank you for taking time to come for your Medicare Wellness Visit. I appreciate your ongoing commitment to your health goals. Please review the following plan we discussed and let me know if I can assist you in the future.   Screening recommendations/referrals: Colonoscopy: Done 07/14/21 repeat in 1 year Recommended yearly ophthalmology/optometry visit for glaucoma screening and checkup Recommended yearly dental visit for hygiene and checkup  Vaccinations: Influenza vaccine: 08/03/21 Pneumococcal vaccine: Up to date Tdap vaccine: Due and discussed Shingles vaccine: Completed 3/3, 05/03/19 Covid-19: Completed 1/12, 2/22/221 & 08/10/21  Advanced directives: Advance directive discussed with you today. I have provided a copy for you to complete at home and have notarized. Once this is complete please bring a copy in to our office so we can scan it into your chart.   Conditions/risks identified: Get rid of this walker   Next appointment: Follow up in one year for your annual wellness visit.   Preventive Care 35 Years and Older, Male Preventive care refers to lifestyle choices and visits with your health care provider that can promote health and wellness. What does preventive care include? A yearly physical exam. This is also called an annual well check. Dental exams once or twice a year. Routine eye exams. Ask your health care provider how often you should have your eyes checked. Personal lifestyle choices, including: Daily care of your teeth and gums. Regular physical activity. Eating a healthy diet. Avoiding tobacco and drug use. Limiting alcohol use. Practicing safe sex. Taking low doses of aspirin every day. Taking vitamin and mineral supplements as recommended by your health care provider. What happens during an annual well check? The services and screenings done by your health care provider during your annual well check will depend on your age, overall health,  lifestyle risk factors, and family history of disease. Counseling  Your health care provider may ask you questions about your: Alcohol use. Tobacco use. Drug use. Emotional well-being. Home and relationship well-being. Sexual activity. Eating habits. History of falls. Memory and ability to understand (cognition). Work and work Statistician. Screening  You may have the following tests or measurements: Height, weight, and BMI. Blood pressure. Lipid and cholesterol levels. These may be checked every 5 years, or more frequently if you are over 56 years old. Skin check. Lung cancer screening. You may have this screening every year starting at age 44 if you have a 30-pack-year history of smoking and currently smoke or have quit within the past 15 years. Fecal occult blood test (FOBT) of the stool. You may have this test every year starting at age 45. Flexible sigmoidoscopy or colonoscopy. You may have a sigmoidoscopy every 5 years or a colonoscopy every 10 years starting at age 33. Prostate cancer screening. Recommendations will vary depending on your family history and other risks. Hepatitis C blood test. Hepatitis B blood test. Sexually transmitted disease (STD) testing. Diabetes screening. This is done by checking your blood sugar (glucose) after you have not eaten for a while (fasting). You may have this done every 1-3 years. Abdominal aortic aneurysm (AAA) screening. You may need this if you are a current or former smoker. Osteoporosis. You may be screened starting at age 37 if you are at high risk. Talk with your health care provider about your test results, treatment options, and if necessary, the need for more tests. Vaccines  Your health care provider may recommend certain vaccines, such as: Influenza vaccine. This is recommended every year. Tetanus, diphtheria, and  acellular pertussis (Tdap, Td) vaccine. You may need a Td booster every 10 years. Zoster vaccine. You may need this  after age 59. Pneumococcal 13-valent conjugate (PCV13) vaccine. One dose is recommended after age 38. Pneumococcal polysaccharide (PPSV23) vaccine. One dose is recommended after age 44. Talk to your health care provider about which screenings and vaccines you need and how often you need them. This information is not intended to replace advice given to you by your health care provider. Make sure you discuss any questions you have with your health care provider. Document Released: 10/23/2015 Document Revised: 06/15/2016 Document Reviewed: 07/28/2015 Elsevier Interactive Patient Education  2017 [place] Prevention in the Home Falls can cause injuries. They can happen to people of all ages. There are many things you can do to make your home safe and to help prevent falls. What can I do on the outside of my home? Regularly fix the edges of walkways and driveways and fix any cracks. Remove anything that might make you trip as you walk through a door, such as a raised step or threshold. Trim any bushes or trees on the path to your home. Use bright outdoor lighting. Clear any walking paths of anything that might make someone trip, such as rocks or tools. Regularly check to see if handrails are loose or broken. Make sure that both sides of any steps have handrails. Any raised decks and porches should have guardrails on the edges. Have any leaves, snow, or ice cleared regularly. Use sand or salt on walking paths during winter. Clean up any spills in your garage right away. This includes oil or grease spills. What can I do in the bathroom? Use night lights. Install grab bars by the toilet and in the tub and shower. Do not use towel bars as grab bars. Use non-skid mats or decals in the tub or shower. If you need to sit down in the shower, use a plastic, non-slip stool. Keep the floor dry. Clean up any water that spills on the floor as soon as it happens. Remove soap buildup in the tub or  shower regularly. Attach bath mats securely with double-sided non-slip rug tape. Do not have throw rugs and other things on the floor that can make you trip. What can I do in the bedroom? Use night lights. Make sure that you have a light by your bed that is easy to reach. Do not use any sheets or blankets that are too big for your bed. They should not hang down onto the floor. Have a firm chair that has side arms. You can use this for support while you get dressed. Do not have throw rugs and other things on the floor that can make you trip. What can I do in the kitchen? Clean up any spills right away. Avoid walking on wet floors. Keep items that you use a lot in easy-to-reach places. If you need to reach something above you, use a strong step stool that has a grab bar. Keep electrical cords out of the way. Do not use floor polish or wax that makes floors slippery. If you must use wax, use non-skid floor wax. Do not have throw rugs and other things on the floor that can make you trip. What can I do with my stairs? Do not leave any items on the stairs. Make sure that there are handrails on both sides of the stairs and use them. Fix handrails that are broken or loose. Make sure that  handrails are as long as the stairways. Check any carpeting to make sure that it is firmly attached to the stairs. Fix any carpet that is loose or worn. Avoid having throw rugs at the top or bottom of the stairs. If you do have throw rugs, attach them to the floor with carpet tape. Make sure that you have a light switch at the top of the stairs and the bottom of the stairs. If you do not have them, ask someone to add them for you. What else can I do to help prevent falls? Wear shoes that: Do not have high heels. Have rubber bottoms. Are comfortable and fit you well. Are closed at the toe. Do not wear sandals. If you use a stepladder: Make sure that it is fully opened. Do not climb a closed stepladder. Make  sure that both sides of the stepladder are locked into place. Ask someone to hold it for you, if possible. Clearly mark and make sure that you can see: Any grab bars or handrails. First and last steps. Where the edge of each step is. Use tools that help you move around (mobility aids) if they are needed. These include: Canes. Walkers. Scooters. Crutches. Turn on the lights when you go into a dark area. Replace any light bulbs as soon as they burn out. Set up your furniture so you have a clear path. Avoid moving your furniture around. If any of your floors are uneven, fix them. If there are any pets around you, be aware of where they are. Review your medicines with your doctor. Some medicines can make you feel dizzy. This can increase your chance of falling. Ask your doctor what other things that you can do to help prevent falls. This information is not intended to replace advice given to you by your health care provider. Make sure you discuss any questions you have with your health care provider. Document Released: 07/23/2009 Document Revised: 03/03/2016 Document Reviewed: 10/31/2014 Elsevier Interactive Patient Education  2017 [employer].

## 2021-09-14 ENCOUNTER — Other Ambulatory Visit: Payer: Self-pay

## 2021-09-14 ENCOUNTER — Encounter: Payer: Self-pay | Admitting: Family Medicine

## 2021-09-14 ENCOUNTER — Ambulatory Visit (INDEPENDENT_AMBULATORY_CARE_PROVIDER_SITE_OTHER): Payer: Medicare HMO | Admitting: Family Medicine

## 2021-09-14 VITALS — BP 122/60 | HR 96 | Temp 97.3°F | Ht 71.0 in | Wt 199.2 lb

## 2021-09-14 DIAGNOSIS — E785 Hyperlipidemia, unspecified: Secondary | ICD-10-CM | POA: Diagnosis not present

## 2021-09-14 DIAGNOSIS — K219 Gastro-esophageal reflux disease without esophagitis: Secondary | ICD-10-CM

## 2021-09-14 DIAGNOSIS — J449 Chronic obstructive pulmonary disease, unspecified: Secondary | ICD-10-CM | POA: Diagnosis not present

## 2021-09-14 DIAGNOSIS — E1142 Type 2 diabetes mellitus with diabetic polyneuropathy: Secondary | ICD-10-CM

## 2021-09-14 DIAGNOSIS — N183 Chronic kidney disease, stage 3 unspecified: Secondary | ICD-10-CM

## 2021-09-14 DIAGNOSIS — G8929 Other chronic pain: Secondary | ICD-10-CM

## 2021-09-14 DIAGNOSIS — D696 Thrombocytopenia, unspecified: Secondary | ICD-10-CM

## 2021-09-14 DIAGNOSIS — I10 Essential (primary) hypertension: Secondary | ICD-10-CM

## 2021-09-14 DIAGNOSIS — C220 Liver cell carcinoma: Secondary | ICD-10-CM

## 2021-09-14 DIAGNOSIS — B0230 Zoster ocular disease, unspecified: Secondary | ICD-10-CM

## 2021-09-14 DIAGNOSIS — N401 Enlarged prostate with lower urinary tract symptoms: Secondary | ICD-10-CM | POA: Diagnosis not present

## 2021-09-14 DIAGNOSIS — M5442 Lumbago with sciatica, left side: Secondary | ICD-10-CM

## 2021-09-14 DIAGNOSIS — F3342 Major depressive disorder, recurrent, in full remission: Secondary | ICD-10-CM | POA: Diagnosis not present

## 2021-09-14 NOTE — Patient Instructions (Addendum)
So sorry you are having such a hard time-please let us know if there is anything we can do   No labs today.   Please check your gabapentin at home - I think this would be beneficial for your nerve pain in your ankles. If you are not currently taking that, you may restart or I can send you in another prescription.  Recommended follow up: Keep your January visit.

## 2021-09-14 NOTE — Progress Notes (Signed)
Phone [phone] In person visit   Subjective:   [name] is a 80 y.o. year old very pleasant male patient who presents for/with See problem oriented charting Chief Complaint  Patient presents with   Follow-up   Diabetes    This visit occurred during the SARS-CoV-2 public health emergency.  Safety protocols were in place, including screening questions prior to the visit, additional usage of staff PPE, and extensive cleaning of exam room while observing appropriate contact time as indicated for disinfecting solutions.   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 Off narcotics 06/05/17 due to negative UDS for opiates x2. Full history 06/12/14. Pain contract signed.  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[place]) 04/06/2007    Priority: High   Anxiety 02/25/2020    Priority: Medium    B12 deficiency 08/20/2015    Priority: Medium    Diabetic polyneuropathy associated with type 2 diabetes mellitus ([place]) 08/07/2015    Priority: Medium    CKD (chronic kidney disease), stage III ([place]) 05/06/2015    Priority: Medium    Fatty liver 11/04/2014    Priority: Medium    BPH (benign prostatic hyperplasia) 06/20/2007    Priority: Medium    Depression 05/02/2007    Priority: Medium    Hyperlipidemia associated with type 2 diabetes mellitus ([place]) 04/06/2007    Priority: Medium    Essential hypertension 04/06/2007     Priority: Medium    Osteoarthritis 04/06/2007    Priority: Medium    Iron deficiency anemia due to chronic blood loss 04/19/2021    Priority: Low   Aortic atherosclerosis ([place]) 12/14/2019    Priority: Low   GERD (gastroesophageal reflux disease) 09/30/2017    Priority: Low   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 positive 03/29/2016    Priority: Low   Diplopia 06/05/2015    Priority: Low   Candidal balanoposthitis 06/20/2014    Priority: Low   PCO (posterior capsular opacification) 06/19/2013    Priority: Low   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    Priority: Low   Pseudophakia of left eye 04/10/2013    Priority: Low   Nuclear cataract 01/20/2012    Priority: Low   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    Priority: Low   Malnutrition of moderate degree 07/14/2021   Cirrhosis of liver without ascites (HCC)    Mucosal abnormality of stomach    Portal hypertensive gastropathy (HCC)    Secondary esophageal varices without bleeding (HCC)    Generalized weakness 07/13/2021   Chronic diarrhea 07/13/2021   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 6.7 g 3   Alcohol  Swabs (DROPSAFE ALCOHOL PREP) 70 % PADS USE  UP  TO FOUR TIMES DAILY AS DIRECTED 100 each 3   aspirin EC 81 MG tablet Take 81 mg by mouth daily.     blood glucose meter kit and supplies KIT Dispense based on patient and insurance preference. Use up to four times daily as directed. 1 each 0   Blood Glucose Monitoring Suppl (TRUE METRIX AIR GLUCOSE METER) w/Device KIT Use to check blood sugars daily. Dx: E11.9 1 kit 3   busPIRone (BUSPAR) 7.5 MG tablet Take 1 tablet (7.5 mg total) by mouth 2 (two) times daily as needed. for anxiety (Patient taking  differently: Take 7.5 mg by mouth at bedtime.) 180 tablet 3   carvedilol (COREG) 6.25 MG tablet Take 1 tablet (6.25 mg total) by mouth 2 (two) times daily with a meal. 180 tablet 1   escitalopram (LEXAPRO) 5 MG tablet Take 1 tablet (5 mg total) by mouth daily. 90 tablet 3   finasteride (PROSCAR) 5 MG tablet TAKE 1 TABLET EVERY DAY 90 tablet 1   furosemide (LASIX) 20 MG tablet Take 1 tablet (20 mg total) by mouth daily. 90 tablet 1   gabapentin (NEURONTIN) 300 MG capsule TAKE 1 CAPSULE TWICE DAILY (Patient taking differently: Take 300 mg by mouth 2 (two) times daily.) 180 capsule 3   glucose blood (TRUE METRIX BLOOD GLUCOSE TEST) test strip TEST BLOOD SUGAR  UP  TO FOUR TIMES DAILY AS DIRECTED 100 strip 1   loperamide (IMODIUM) 2 MG capsule Take 1 capsule (2 mg total) by mouth 3 (three) times daily between meals as needed for diarrhea or loose stools. 30 capsule 0   Multiple Vitamin (MULTIVITAMIN WITH MINERALS) TABS tablet Take 1 tablet by mouth daily. 30 tablet 0   Polyvinyl Alcohol-Povidone (REFRESH OP) Place 1 drop into both eyes at bedtime.     rosuvastatin (CRESTOR) 10 MG tablet TAKE 1 TABLET EVERY DAY 90 tablet 3   sacubitril-valsartan (ENTRESTO) 49-51 MG Take 1 tablet by mouth 2 (two) times daily. 56 tablet 0   spironolactone (ALDACTONE) 50 MG tablet Take 1 tablet (50 mg total) by mouth daily. 30 tablet 1   tamsulosin (FLOMAX) 0.4 MG CAPS capsule TAKE 1 CAPSULE EVERY DAY (Patient taking differently: Take 0.4 mg by mouth daily.) 90 capsule 1   TRUEplus Lancets 28G MISC TEST BLOOD SUGAR  UP  TO FOUR TIMES DAILY AS DIRECTED 100 each 3   No current facility-administered medications for this visit.     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 Irene Limbo S: Not a surgical candidate. Patient has undergone radiation treatment.  For cirrhosis GI has advanced him to furosemide 20 mg daily and spironolactone 50 mg daily.  Therapeutic paracentesis scheduled.  He had rather significant right leg swelling but DVT was ruled out. He states paracentesis and states swelling in abdomen and arm returned in 1.5 days unfortunately- does not want to pursue again at this time.  A/P: Hepatocellular carcinoma- not surgical  candidate - luckily stabl eon CT 07/12/21. Unfortunately with cirrhosis/ascites- paracentesis only short term relief of ascites- will continue furosemide and lasix- thankfully renal function largely stable on check 7 days ago- cr within levels from last few months- would not consider worsening.  -he is taking some tylenol for pain- advised against -He is struggling with distress from his cancer and cirrhosis and just not feeling as well overall.  Also struggling with medical bills and pressure from our organization about being turned over to collections-I apologized to him for the stressors-I wish there was something we could do for him in this regard  # Neuropathy S:he is getting bilateral ankle pain worse at night- could be neuropathy related- already on gabapentin 300 mg twice daily  -gapentin does have listing for possible hepatotoxicity postmarketing but this has been very troublesome for him and he has tolerated in the past A/P: neuropathy worsening- may have run out of gabapentin- told him he could restart   # Diabetes S: compliant with metformin 1054m in AM and 5058min the evening in the past- now he is off medicine Lab Results  Component Value Date   HGBA1C 5.2 07/13/2021   HGBA1C 6.5 (A) 12/15/2020   HGBA1C 5.9 (A) 02/25/2020  A/P: has done well without  meds- will recheck next year  #Congestive heart failure with EF 20 to 25% at low- most recently 50-55%//hypertension/CKD stage III S: Post heart failure hospitalization patient compliant with carvedilol, Lasix 20 mg, spironolactone 50 mg from GI, Entresto started by cardiology. Prior to heart failure: amlodipine 1067mlisinopril 2.5mg70moday, no increased edema in legs- overall stable. Weight down 7 lbs from last visit here- could be fluid related with paracentesis   Chronic kidney disease has been stable with GFR in the 50s-several readings in 2019 and 2020 with GFR at 60 or above - including most recentlyu  BP Readings from Last 3 Encounters:  09/14/21 122/60  09/10/21 130/72  08/30/21 (!) 148/72  A/P: For CHF-appears stable- continue curren tmeds For HTN-at goal- continue current meds For CKD-actually stage II- monitor only   #hyperlipidemia S: compliant with rosuvastatin 10 mg daily. LDL goal under 70 Lab Results  Component Value Date   CHOL 120 11/07/2019   HDL 45.60 11/07/2019   LDLCALC 49 11/07/2019   LDLDIRECT 57.0 10/22/2018   TRIG 126.0 11/07/2019   CHOLHDL 3 11/07/2019  A/P: stable on last check- due for repeat but has just had   Recommended follow up: keep January visit Future Appointments  Date Time Provider [place]/05/2021  3:00 PM KennNoralyn Pick [facility]/09/2021 10:45 AM GalaMarzetta BoardM [facility]  10/06/2021  3:00 PM AP - ECHO 1 OUTPATIENT [facility]-CARDIOPUL [place] [facility]  10/13/2021  9:30 AM [facility] LAB [facility] None  10/13/2021 10:00 AM KaleBrunetta Genera [facility]  11/01/2021  3:40 PM HuntMarin Olp [facility]  11/10/2021 10:10 AM Pyrtle, Jay Lajuan Lines [facility]20[facility]  3:20 PM McDoSatira Sark [facility]  09/23/2022  2:30 PM [facility] HEALTH COACH [facility]   Lab/Order associations:   ICD-10-CM   1. Diabetic polyneuropathy associated with type 2  diabetes mellitus (HCC)  E11.42     2. Controlled type 2 diabetes mellitus with diabetic polyneuropathy, without long-term current use of insulin (HCC)  E11.42     3. Chronic obstructive pulmonary disease, unspecified COPD type (HCC)[place]44.9     4. Recurrent major depressive  disorder, in full remission ([place])  F33.42     5. Stage 3 chronic kidney disease, unspecified whether stage 3a or 3b CKD (HCC)  N18.30     6. Hepatocellular carcinoma ([place])  C22.0     7. Hyperlipidemia, unspecified hyperlipidemia type  E78.5     8. Essential hypertension  I10     9. Gastroesophageal reflux disease without esophagitis  K21.9     10. Benign prostatic hyperplasia with lower urinary tract symptoms, symptom details unspecified  N40.1     11. Ocular herpes  B02.30     12. Chronic bilateral low back pain with left-sided sciatica  M54.42    G89.29     13. Thrombocytopenia ([place])  D69.6       No orders of the defined types were placed in this encounter.  I,Harris Phan,acting as a Education administrator for [name], MD.,have documented all relevant documentation on the behalf of [name], MD,as directed by  [name], MD while in the presence of [name], MD.  I, [name], MD, have reviewed all documentation for this visit. The documentation on 09/14/21 for the exam, diagnosis, procedures, and orders are all accurate and complete.  Return precautions advised.  [name], MD

## 2021-09-16 ENCOUNTER — Ambulatory Visit: Payer: Medicare HMO | Admitting: Nurse Practitioner

## 2021-09-16 ENCOUNTER — Ambulatory Visit (INDEPENDENT_AMBULATORY_CARE_PROVIDER_SITE_OTHER): Payer: Medicare HMO | Admitting: Nurse Practitioner

## 2021-09-16 ENCOUNTER — Encounter: Payer: Self-pay | Admitting: Nurse Practitioner

## 2021-09-16 ENCOUNTER — Other Ambulatory Visit (INDEPENDENT_AMBULATORY_CARE_PROVIDER_SITE_OTHER): Payer: Medicare HMO

## 2021-09-16 VITALS — BP 118/70 | HR 100 | Ht 71.0 in | Wt 196.0 lb

## 2021-09-16 DIAGNOSIS — K7031 Alcoholic cirrhosis of liver with ascites: Secondary | ICD-10-CM

## 2021-09-16 LAB — HEPATIC FUNCTION PANEL
ALT: 12 U/L (ref 0–53)
AST: 26 U/L (ref 0–37)
Albumin: 2.8 g/dL — ABNORMAL LOW (ref 3.5–5.2)
Alkaline Phosphatase: 163 U/L — ABNORMAL HIGH (ref 39–117)
Bilirubin, Direct: 0.3 mg/dL (ref 0.0–0.3)
Total Bilirubin: 0.8 mg/dL (ref 0.2–1.2)
Total Protein: 6 g/dL (ref 6.0–8.3)

## 2021-09-16 LAB — BASIC METABOLIC PANEL
BUN: 16 mg/dL (ref 6–23)
CO2: 31 mEq/L (ref 19–32)
Calcium: 9.1 mg/dL (ref 8.4–10.5)
Chloride: 108 mEq/L (ref 96–112)
Creatinine, Ser: 1 mg/dL (ref 0.40–1.50)
GFR: 71.02 mL/min (ref >60.00)
Glucose, Bld: 153 mg/dL — ABNORMAL HIGH (ref 70–99)
Potassium: 5.4 mEq/L — ABNORMAL HIGH (ref 3.5–5.1)
Sodium: 142 mEq/L (ref 135–145)

## 2021-09-16 LAB — PROTIME-INR
INR: 1.1 ratio — ABNORMAL HIGH (ref 0.8–1.0)
Prothrombin Time: 12.5 s (ref 9.6–13.1)

## 2021-09-16 NOTE — Progress Notes (Signed)
09/16/2021 [name] [id number] [birth year]   Chief Complaint: Cirrhosis follow up  History of Present Illness:   [name]. [name] is an 80 year old male with a past medical history of CHF, diabetes mellitus type 2, anemia, thrombocytopenia, cirrhosis, HCC s/p SBRT.  Refer to office visit 08/25/2021 for comprehensive history review.  At the time of his 08/25/2021 appointment, complained of increased abdominal swelling with associated shortness of breath.  On exam, he was found to have a gaseous distention of his abdomen with ascites and he had significant edema and erythema to his RLE.  He was prescribed furosemide 20 mg daily and spironolactone 50 mg daily.  Labs showed a sodium level 140.  Potassium 4.3.  BUN 18.  Creatinine 0.90.  Alk phos 146.  Albumin 2.9.  Total bili 1.2.  AST 37.  ALT 14.  WBC 4.1.  Hemoglobin 9.8.  MCV 104.0.  Platelets 78.  INR 1.2.  An abdominal x-ray showed mild gaseous distention of the small bowel throughout the colon without evidence of obstruction.  He was sent to [facility] for a stat lower extremity Doppler which was negative for DVT.  He underwent a paracentesis at [facility] 08/30/2021 and 5 L of peritoneal fluid was removed.  No evidence of SBP.  His shortness of breath significantly improved s/p paracentesis.  He was prescribed MiraLAX to increase stool output which resulted in reduced abdominal distention as well.  He presents to our office today for further evaluation.  He is accompanied by his wife and daughter.  He feels his abdomen is swelling up again with associated mild shortness of breath.  His lower extremity edema has significantly improved.  He is compliant with taking Furosemide 20 mg daily and Spironolactone 50 mg daily.  Repeat labs done 09/07/2021 showed a sodium level 140.  Potassium 4.6.  BUN 15.  Creatinine 1.04 (Cr .90 on 11/16). He is passing multiple small soft balls of stool with increased urgency for daily on MiraLAX.   No diarrhea.  He is seen a scant amount of bright red blood on the toilet tissue on a few occasions.  No melena.  His appetite is fair.    Paracentesis 08/30/2021: A total of approximately 5 L of clear yellow fluid was removed. Samples were sent to the laboratory as requested by the clinical team.  Abdominal x-ray 08/25/2021: Mild gaseous distention of the small bowel throughout the abdomen without definite evidence of mechanical obstruction. Further evaluation with CT may be considered as indicated.   Current Outpatient Medications on File Prior to Visit  Medication Sig Dispense Refill   albuterol (VENTOLIN HFA) 108 (90 Base) MCG/ACT inhaler Inhale 2 puffs into the lungs every 6 (six) hours as needed for wheezing or shortness of breath. 6.7 g 3   Alcohol Swabs (DROPSAFE ALCOHOL PREP) 70 % PADS USE  UP  TO FOUR TIMES DAILY AS DIRECTED 100 each 3   aspirin EC 81 MG tablet Take 81 mg by mouth daily.     blood glucose meter kit and supplies KIT Dispense based on patient and insurance preference. Use up to four times daily as directed. 1 each 0   Blood Glucose Monitoring Suppl (TRUE METRIX AIR GLUCOSE METER) w/Device KIT Use to check blood sugars daily. Dx: E11.9 1 kit 3   busPIRone (BUSPAR) 7.5 MG tablet Take 1 tablet (7.5 mg total) by mouth 2 (two) times daily as needed. for anxiety (Patient taking differently: Take 7.5 mg  by mouth at bedtime.) 180 tablet 3   carvedilol (COREG) 6.25 MG tablet Take 1 tablet (6.25 mg total) by mouth 2 (two) times daily with a meal. 180 tablet 1   escitalopram (LEXAPRO) 5 MG tablet Take 1 tablet (5 mg total) by mouth daily. 90 tablet 3   finasteride (PROSCAR) 5 MG tablet TAKE 1 TABLET EVERY DAY 90 tablet 1   furosemide (LASIX) 20 MG tablet Take 1 tablet (20 mg total) by mouth daily. 90 tablet 1   gabapentin (NEURONTIN) 300 MG capsule TAKE 1 CAPSULE TWICE DAILY (Patient taking differently: Take 300 mg by mouth 2 (two) times daily.) 180 capsule 3   glucose blood  (TRUE METRIX BLOOD GLUCOSE TEST) test strip TEST BLOOD SUGAR  UP  TO FOUR TIMES DAILY AS DIRECTED 100 strip 1   loperamide (IMODIUM) 2 MG capsule Take 1 capsule (2 mg total) by mouth 3 (three) times daily between meals as needed for diarrhea or loose stools. 30 capsule 0   Multiple Vitamin (MULTIVITAMIN WITH MINERALS) TABS tablet Take 1 tablet by mouth daily. 30 tablet 0   Polyvinyl Alcohol-Povidone (REFRESH OP) Place 1 drop into both eyes at bedtime.     rosuvastatin (CRESTOR) 10 MG tablet TAKE 1 TABLET EVERY DAY 90 tablet 3   sacubitril-valsartan (ENTRESTO) 49-51 MG Take 1 tablet by mouth 2 (two) times daily. 56 tablet 0   spironolactone (ALDACTONE) 50 MG tablet Take 1 tablet (50 mg total) by mouth daily. 30 tablet 1   tamsulosin (FLOMAX) 0.4 MG CAPS capsule TAKE 1 CAPSULE EVERY DAY (Patient taking differently: Take 0.4 mg by mouth daily.) 90 capsule 1   TRUEplus Lancets 28G MISC TEST BLOOD SUGAR  UP  TO FOUR TIMES DAILY AS DIRECTED 100 each 3   No current facility-administered medications on file prior to visit.   No Known Allergies  Current Medications, Allergies, Past Medical History, Past Surgical History, Family History and Social History were reviewed in [employer] record.  Review of Systems:   Constitutional: Negative for fever, sweats, chills or weight loss.  Respiratory: See HPI.  Cardiovascular: See HPI.  Gastrointestinal: See HPI.  Musculoskeletal: Negative for back pain or muscle aches.  Neurological: Negative for dizziness, headaches or paresthesias.   Physical Exam: BP 118/70   Pulse 100   Ht '5\' 11"'  (1.803 m)   Wt 196 lb (88.9 kg)   BMI 27.34 kg/m  General: 80 year old male presents in a wheelchair in no acute distress. Head: Normocephalic and atraumatic. Eyes: No scleral icterus. Conjunctiva pink . Ears: Normal auditory acuity. Lungs: Clear throughout to auscultation. Heart: Regular rate and rhythm.  Abdomen: Distended, moderate ascites  present, dull to percussion. No masses or hepatomegaly. Normal bowel sounds x 4 quadrants.  Rectal: Deferred Musculoskeletal: Symmetrical with no gross deformities. Extremities: RLE edema and erythema significantly improved when compared to prior exam, mild LLE edema. Neurological: Alert oriented x 4. No focal deficits.  Psychological: Alert and cooperative. Normal mood and affect.  Speech is clear.  He answers questions appropriately.  Assessment and Recommendations:  72) 79) 80 year old male with decompensated cirrhosis with ascites, portal hypertension, mild portal hypertensive gastropathy, grade I esophageal varices and HCC s/p SBRT. S/P paracentesis 08/30/2021 5L of peritoneal fluid removed. No SBP.  No overt hepatic encephalopathy.  -BMP, hepatic panel, CBC and INR -Continue Furosemide 20 mg 1 p.o. daily and Spironolactone 50 mg 1 tab p.o. daily, if BUN/Cr level stable will increase dose of diuretics  -Therapeutic paracentesis to be  scheduled after the above lab results reviewed -Continue 2gm low sodium diet   2) Stable chronic macrocytic anemia, secondary to cirrhosis/HCC, infrequent rectal bleeding. Hg 9.8. HCT 29.7. Prior B12 deficiency. Bone marrow biopsy 02/2018 showed hypercellular bone marow for age with trilineage hematopoiesis.  -CBC as ordered above  -Continue follow up with heme/oncologist Dr. Irene Limbo -Patient to contact our office if rectal bleeding persists or worsens  3) Thrombocytopenia. PLT 78. CTAP 07/2021 without splenomegaly.  -Continue follow up with Dr. Irene Limbo    4) Gastritis/atypical gastric lesion  -Follow-up with Dr. Hilarie Fredrickson in 3 months as previously discussed to consider eventual follow-up EGD   5) CHF followed by cardiology. LV EF 50 to 55% per ECHO 06/2020. Repeat ECHO scheduled 10/06/2021.    Patient to follow up in office with Dr. Hilarie Fredrickson in 2 months

## 2021-09-16 NOTE — Patient Instructions (Signed)
If you are age 80 or older, your body mass index should be between 23-30. Your Body mass index is 27.34 kg/m. If this is out of the aforementioned range listed, please consider follow up with your Primary Care Provider.  The [facility] providers would like to encourage you to use [facility] to communicate with providers for non-urgent requests or questions.  Due to long hold times on the telephone, sending your provider a message by [facility] may be faster and more efficient way to get a response. Please allow 48 business hours for a response.  Please remember that this is for non-urgent requests/questions.  LABS:  Lab work has been ordered for you today. Our lab is located in the basement. Press "B" on the elevator. The lab is located at the first door on the left as you exit the elevator.  HEALTHCARE LAWS AND MY CHART RESULTS: Due to recent changes in healthcare laws, you may see the results of your imaging and laboratory studies on MyChart before your provider has had a chance to review them.   We understand that in some cases there may be results that are confusing or concerning to you. Not all laboratory results come back in the same time frame and the provider may be waiting for multiple results in order to interpret others.  Please give [place] 48 hours in order for your provider to thoroughly review all the results before contacting the office for clarification of your results.   RECOMMENDATIONS: Continue same dose of Spironolactone and Furosemide for now. Our office will contact you to schedule a paracentesis.  It was great seeing you today! Thank you for entrusting me with your care and choosing [facility].  Noralyn Pick, CRNP

## 2021-09-17 ENCOUNTER — Other Ambulatory Visit: Payer: Self-pay

## 2021-09-17 ENCOUNTER — Other Ambulatory Visit: Payer: Self-pay | Admitting: Nurse Practitioner

## 2021-09-17 DIAGNOSIS — K7031 Alcoholic cirrhosis of liver with ascites: Secondary | ICD-10-CM

## 2021-09-17 LAB — CBC WITH DIFFERENTIAL/PLATELET
Basophils Absolute: 0 10*3/uL (ref 0.0–0.1)
Basophils Relative: 1 % (ref 0.0–3.0)
Eosinophils Absolute: 0.1 10*3/uL (ref 0.0–0.7)
Eosinophils Relative: 1.5 % (ref 0.0–5.0)
HCT: 31 % — ABNORMAL LOW (ref 39.0–52.0)
Hemoglobin: 10.1 g/dL — ABNORMAL LOW (ref 13.0–17.0)
Lymphocytes Relative: 21.1 % (ref 12.0–46.0)
Lymphs Abs: 0.8 10*3/uL (ref 0.7–4.0)
MCHC: 32.7 g/dL (ref 30.0–36.0)
MCV: 104.6 fl — ABNORMAL HIGH (ref 78.0–100.0)
Monocytes Absolute: 0.3 10*3/uL (ref 0.1–1.0)
Monocytes Relative: 7.9 % (ref 3.0–12.0)
Neutro Abs: 2.4 10*3/uL (ref 1.4–7.7)
Neutrophils Relative %: 68.5 % (ref 43.0–77.0)
Platelets: 115 10*3/uL — ABNORMAL LOW (ref 150.0–400.0)
RBC: 2.96 Mil/uL — ABNORMAL LOW (ref 4.22–5.81)
RDW: 18.4 % — ABNORMAL HIGH (ref 11.5–15.5)
WBC: 3.6 10*3/uL — ABNORMAL LOW (ref 4.0–10.5)

## 2021-09-17 MED ORDER — FUROSEMIDE 20 MG PO TABS: 40.0000 mg | ORAL_TABLET | Freq: Every day | ORAL | 1 refills | Status: DC

## 2021-09-17 NOTE — Progress Notes (Deleted)
error 

## 2021-09-20 ENCOUNTER — Telehealth: Payer: Self-pay | Admitting: Internal Medicine

## 2021-09-20 ENCOUNTER — Other Ambulatory Visit: Payer: Self-pay

## 2021-09-20 ENCOUNTER — Ambulatory Visit: Payer: Medicare HMO | Admitting: Podiatry

## 2021-09-20 ENCOUNTER — Encounter: Payer: Self-pay | Admitting: Podiatry

## 2021-09-20 ENCOUNTER — Other Ambulatory Visit (INDEPENDENT_AMBULATORY_CARE_PROVIDER_SITE_OTHER): Payer: Medicare HMO

## 2021-09-20 ENCOUNTER — Ambulatory Visit: Payer: Medicare HMO

## 2021-09-20 DIAGNOSIS — E1142 Type 2 diabetes mellitus with diabetic polyneuropathy: Secondary | ICD-10-CM

## 2021-09-20 DIAGNOSIS — K7031 Alcoholic cirrhosis of liver with ascites: Secondary | ICD-10-CM

## 2021-09-20 DIAGNOSIS — L84 Corns and callosities: Secondary | ICD-10-CM | POA: Diagnosis not present

## 2021-09-20 DIAGNOSIS — B351 Tinea unguium: Secondary | ICD-10-CM

## 2021-09-20 DIAGNOSIS — M79675 Pain in left toe(s): Secondary | ICD-10-CM

## 2021-09-20 DIAGNOSIS — M79674 Pain in right toe(s): Secondary | ICD-10-CM

## 2021-09-20 LAB — BASIC METABOLIC PANEL
BUN: 19 mg/dL (ref 6–23)
CO2: 26 mEq/L (ref 19–32)
Calcium: 8.5 mg/dL (ref 8.4–10.5)
Chloride: 106 mEq/L (ref 96–112)
Creatinine, Ser: 1.18 mg/dL (ref 0.40–1.50)
GFR: 58.22 mL/min — ABNORMAL LOW (ref >60.00)
Glucose, Bld: 156 mg/dL — ABNORMAL HIGH (ref 70–99)
Potassium: 3.9 mEq/L (ref 3.5–5.1)
Sodium: 142 mEq/L (ref 135–145)

## 2021-09-20 NOTE — Telephone Encounter (Signed)
Patient called stating he thought he had something to do for [place] today but he lost his paper and isn't sure what it is.  Didn't know if there might be lab work or if he was supposed to go to the hospital and do something.  Can you please call and advise.  Thank you.

## 2021-09-20 NOTE — Progress Notes (Signed)
Addendum: Reviewed and agree with assessment and management plan. Agree with continued beta blocker at current dose as well as other recommendations as outlined  Navea Woodrow, Lajuan Lines, MD

## 2021-09-20 NOTE — Telephone Encounter (Signed)
He said he would only be at his home number for about an hour.  He no longer has his cell phone.

## 2021-09-20 NOTE — Telephone Encounter (Signed)
Patient was to come for labs today. I have tried contacting him several times. No answer.

## 2021-09-20 NOTE — Progress Notes (Signed)
SITUATION Reason for Consult: Follow-up with diabetic insoles Patient / Caregiver Report: Patient requires more offloading per Dr. Adah Perl  OBJECTIVE DATA History / Diagnosis: Diabetic peripheral neuropathy associated with type 2 diabetes mellitus ([place])  Change in Pathology: Callousing getting worse at met heads bilateral  ACTIONS PERFORMED Patient's equipment was checked for structural stability and fit. Ground out relief and added divot pads bilateral to underside of device. Device(s) intact and fit is excellent. All questions answered and concerns addressed.  PLAN Follow-up as needed (PRN). Plan of care discussed with and agreed upon by patient / caregiver.

## 2021-09-21 ENCOUNTER — Other Ambulatory Visit: Payer: Self-pay

## 2021-09-21 ENCOUNTER — Telehealth: Payer: Self-pay

## 2021-09-21 DIAGNOSIS — K7031 Alcoholic cirrhosis of liver with ascites: Secondary | ICD-10-CM

## 2021-09-21 NOTE — Telephone Encounter (Signed)
FYI

## 2021-09-21 NOTE — Telephone Encounter (Signed)
Simona Huh called in from [facility], patient has missed 3 weeks of PT so they are discharging patient.

## 2021-09-21 NOTE — Telephone Encounter (Signed)
[name] from [facility] called stated that [name] has missed 3 weeks of physical therapy. He stated that right now [name] is unable to do the PT because of being so weak. [name] can be reached at [phone] with any questions. Please Advise.

## 2021-09-22 ENCOUNTER — Ambulatory Visit (HOSPITAL_COMMUNITY): Payer: Medicare HMO

## 2021-09-22 ENCOUNTER — Ambulatory Visit (HOSPITAL_COMMUNITY)
Admission: RE | Admit: 2021-09-22 | Discharge: 2021-09-22 | Disposition: A | Discharge status: Home / Self Care | Payer: Medicare HMO | Source: Ambulatory Visit | Attending: Nurse Practitioner | Admitting: Nurse Practitioner

## 2021-09-22 ENCOUNTER — Other Ambulatory Visit: Payer: Self-pay

## 2021-09-22 DIAGNOSIS — K7031 Alcoholic cirrhosis of liver with ascites: Secondary | ICD-10-CM | POA: Insufficient documentation

## 2021-09-22 DIAGNOSIS — R188 Other ascites: Secondary | ICD-10-CM | POA: Diagnosis not present

## 2021-09-22 HISTORY — PX: IR PARACENTESIS: IMG2679

## 2021-09-22 LAB — BODY FLUID CELL COUNT WITH DIFFERENTIAL
Eos, Fluid: 0 %
Lymphs, Fluid: 58 %
Monocyte-Macrophage-Serous Fluid: 34 % — ABNORMAL LOW (ref 50–90)
Neutrophil Count, Fluid: 8 % (ref 0–25)
Total Nucleated Cell Count, Fluid: 82 cu mm (ref 0–1000)

## 2021-09-22 MED ORDER — LIDOCAINE HCL 1 % IJ SOLN
INTRAMUSCULAR | Status: AC
  Administered 2021-09-22: 13:00:00 10 mL
  Filled 2021-09-22: qty 20

## 2021-09-22 NOTE — Procedures (Signed)
PROCEDURE SUMMARY:  Successful US guided paracentesis from right abdomen.  Yielded 7 L of clear yellow fluid.  No immediate complications.  Pt tolerated well.   Specimen sent for labs.  EBL < 2 mL  Theresa Duty, NP 09/22/2021 3:00 PM

## 2021-09-22 NOTE — Telephone Encounter (Signed)
FYI

## 2021-09-22 NOTE — Telephone Encounter (Signed)
Please make sure patient knows we are available I know he has not been feeling well related to his cancer.  We are available if he would like to be seen for follow-up visit-hate to hear he is not feeling well still

## 2021-09-22 NOTE — Telephone Encounter (Signed)
Lm for pt tcb. 

## 2021-09-23 LAB — PATHOLOGIST SMEAR REVIEW

## 2021-09-24 ENCOUNTER — Telehealth: Payer: Self-pay | Admitting: Pharmacist

## 2021-09-24 NOTE — Progress Notes (Signed)
Subjective: [name] is a 80 y.o. male patient seen today for follow up of  at risk foot care with h/o diabetic neuropathy. He is seen for preulcerative callus right foot and  painful thick toenails that are difficult to trim. Pain interferes with ambulation. Aggravating factors include wearing enclosed shoe gear. Pain is relieved with periodic professional debridement.  New problems reported today: None.  He is accompanied by his wife on today's visit. They voice no new pedal concerns on today.  Patient states their blood glucose was 149 mg/dl today.   PCP is [name], MD. Last visit was: 05/14/2021.  No Known Allergies  Objective: Physical Exam  General: Patient is a pleasant 80 y.o. Caucasian male obese in NAD. AAO x 3.   Neurovascular Examination: CFT <3 seconds b/l LE. Palpable DP/PT pulses b/l LE. Digital hair absent b/l. Skin temperature gradient WNL b/l. No pain with calf compression b/l. No edema noted b/l. No cyanosis or clubbing noted b/l LE.  Protective sensation diminished with 10g monofilament b/l.  Dermatological:  Pedal integument with normal turgor, texture and tone BLE. No open wounds b/l LE. No interdigital macerations noted b/l LE. Toenails 1-5 b/l elongated, discolored, dystrophic, thickened, crumbly with subungual debris and tenderness to dorsal palpation. Preulcerative lesion noted submet head 1 right foot. There is visible subdermal hemorrhage. There is no surrounding erythema, no edema, no drainage, no odor, no fluctuance.  Musculoskeletal:  Muscle strength 5/5 to all lower extremity muscle groups bilaterally. Hammertoe deformity noted 1-5 b/l. Utilizes cane for ambulation assistance.  Assessment: 1. Pain due to onychomycosis of toenails of both feet   2. Pre-ulcerative calluses   3. Diabetic peripheral neuropathy associated with type 2 diabetes mellitus ([place])    Plan: Patient was evaluated and treated and all questions answered. Consent  given for treatment as described below: -Patient's inserts were offloaded by Pedorthist on today's visit. -Continue foot and shoe inspections daily. Monitor blood glucose per PCP/Endocrinologist's recommendations. -Mycotic toenails 1-5 bilaterally were debrided in length and girth with sterile nail nippers and dremel without incident. -Preulcerative lesion pared submet head 1 right foot. Total number pared=1. -Patient/POA to call should there be question/concern in the interim.  Return in about 3 months (around 12/19/2021).  Marzetta Board, DPM

## 2021-09-24 NOTE — Chronic Care Management (AMB) (Signed)
° ° °Chronic Care Management °Pharmacy Assistant  ° °Name: [name]  MRN: [id number] DOB: 02/08/1941 ° ° °Reason for Encounter: Diabetes Adherence Call °  ° °Recent office visits:  °09/14/2021 OV (PCP) Hunter, Stephen O, MD; no medication changes indicated. ° °Recent consult visits:  °09/20/2021 OV (Podiatry) Galaway, Jennifer L, DPM; no medication changes indicated. ° °09/16/2021 OV (Gastro) Kennedy-Smith, Colleen M, NP; -Continue Furosemide 20 mg 1 p.o. daily and Spironolactone 50 mg 1 tab p.o. daily, if BUN/Cr level stable will increase dose of diuretics ° °Hospital visits:  °None since last adherence call ° °Medications: °Outpatient Encounter Medications as of 09/24/2021  °Medication Sig  ° albuterol (VENTOLIN HFA) 108 (90 Base) MCG/ACT inhaler Inhale 2 puffs into the lungs every 6 (six) hours as needed for wheezing or shortness of breath.  ° Alcohol Swabs (DROPSAFE ALCOHOL PREP) 70 % PADS USE  UP  TO FOUR TIMES DAILY AS DIRECTED  ° aspirin EC 81 MG tablet Take 81 mg by mouth daily.  ° blood glucose meter kit and supplies KIT Dispense based on patient and insurance preference. Use up to four times daily as directed.  ° Blood Glucose Monitoring Suppl (TRUE METRIX AIR GLUCOSE METER) w/Device KIT Use to check blood sugars daily. Dx: E11.9  ° busPIRone (BUSPAR) 7.5 MG tablet Take 1 tablet (7.5 mg total) by mouth 2 (two) times daily as needed. for anxiety (Patient taking differently: Take 7.5 mg by mouth at bedtime.)  ° carvedilol (COREG) 6.25 MG tablet Take 1 tablet (6.25 mg total) by mouth 2 (two) times daily with a meal.  ° escitalopram (LEXAPRO) 5 MG tablet Take 1 tablet (5 mg total) by mouth daily.  ° finasteride (PROSCAR) 5 MG tablet TAKE 1 TABLET EVERY DAY  ° furosemide (LASIX) 20 MG tablet Take 2 tablets (40 mg total) by mouth daily.  ° gabapentin (NEURONTIN) 300 MG capsule TAKE 1 CAPSULE TWICE DAILY (Patient taking differently: Take 300 mg by mouth 2 (two) times daily.)  ° glucose blood (TRUE METRIX  BLOOD GLUCOSE TEST) test strip TEST BLOOD SUGAR  UP  TO FOUR TIMES DAILY AS DIRECTED  ° loperamide (IMODIUM) 2 MG capsule Take 1 capsule (2 mg total) by mouth 3 (three) times daily between meals as needed for diarrhea or loose stools.  ° Multiple Vitamin (MULTIVITAMIN WITH MINERALS) TABS tablet Take 1 tablet by mouth daily.  ° Polyvinyl Alcohol-Povidone (REFRESH OP) Place 1 drop into both eyes at bedtime.  ° rosuvastatin (CRESTOR) 10 MG tablet TAKE 1 TABLET EVERY DAY  ° sacubitril-valsartan (ENTRESTO) 49-51 MG Take 1 tablet by mouth 2 (two) times daily.  ° tamsulosin (FLOMAX) 0.4 MG CAPS capsule TAKE 1 CAPSULE EVERY DAY (Patient taking differently: Take 0.4 mg by mouth daily.)  ° TRUEplus Lancets 28G MISC TEST BLOOD SUGAR  UP  TO FOUR TIMES DAILY AS DIRECTED  ° °No facility-administered encounter medications on file as of 09/24/2021.  ° °Recent Relevant Labs: °Lab Results  °Component Value Date/Time  ° HGBA1C 5.2 07/13/2021 03:18 AM  ° HGBA1C 6.5 (A) 12/15/2020 02:54 PM  ° HGBA1C 5.9 (A) 02/25/2020 01:37 PM  ° HGBA1C 7.1 (H) 10/22/2018 02:51 PM  ° MICROALBUR 60.3 (H) 01/29/2018 12:52 PM  ° MICROALBUR 7.8 (H) 12/01/2015 09:40 AM  °  °Kidney Function °Lab Results  °Component Value Date/Time  ° CREATININE 1.18 09/20/2021 01:17 PM  ° CREATININE 1.00 09/16/2021 03:56 PM  ° CREATININE 0.98 07/06/2021 01:19 PM  ° CREATININE 1.15 04/06/2021 08:57 AM  ° GFR   58.22 (L) 09/20/2021 01:17 PM  ° GFRNONAA >60 07/19/2021 12:11 AM  ° GFRNONAA >60 07/06/2021 01:19 PM  ° GFRAA 59 (L) 06/09/2020 08:59 AM  ° ° °Current antihyperglycemic regimen:  °None currently ° °What recent interventions/DTPs have been made to improve glycemic control:  °Recently discontinued Metformin about 5 weeks ago per patient. ° °Have there been any recent hospitalizations or ED visits since last visit with CPP? No ° °Patient denies hypoglycemic symptoms. ° °Patient denies hyperglycemic symptoms. ° °How often are you checking your blood sugar? once daily ° °What  are your blood sugars ranging?  °Fasting: 148 ° °During the week, how often does your blood glucose drop below 70? Never ° °Are you checking your feet daily/regularly? Yes, patient states he is checking his feet regularly. ° °Adherence Review: °Is the patient currently on a STATIN medication? Yes °Is the patient currently on ACE/ARB medication? Yes °Does the patient have >5 day gap between last estimated fill dates? No ° ° °Care Gaps: °Medicare Annual Wellness: Completed °Ophthalmology Exam: Completed per patient °Foot Exam: Next due on 05/14/2022 °Hemoglobin A1C: 5.2% on 07/13/2021 ° °Future Appointments  °Date Time Provider Department Center  °10/06/2021  3:00 PM [facility] - ECHO 1 OUTPATIENT [facility] [place] [facility]  °10/13/2021  9:30 AM [facility] None  °10/13/2021 10:00 AM Kale, Gautam Kishore, MD [facility] None  °11/01/2021  3:40 PM Hunter, Stephen O, MD [facility]  °11/10/2021 10:10 AM Pyrtle, Jay M, MD [facility]  °11/10/2021  3:20 PM McDowell, Samuel G, MD [facility]  °12/21/2021  3:30 PM Galaway, Jennifer L, DPM [facility]  °12/21/2021  4:00 PM Little, Brian T [facility]  °09/23/2022  2:30 PM [facility] HEALTH COACH [facility]  ° ° °Star Rating Drugs: °Rosuvastatin 10 mg last filled 07/29/2021 90 DS °Metformin 500 mg last filled 08/13/2021 90 DS °Entresto 49-51 mg last filled 04/22/2021 90 DS ° °April D Calhoun, CMA °Clinical Pharmacist Assistant °[phone]  °

## 2021-09-27 ENCOUNTER — Other Ambulatory Visit (INDEPENDENT_AMBULATORY_CARE_PROVIDER_SITE_OTHER): Payer: Medicare HMO

## 2021-09-27 DIAGNOSIS — K7031 Alcoholic cirrhosis of liver with ascites: Secondary | ICD-10-CM

## 2021-09-27 LAB — BASIC METABOLIC PANEL
BUN: 23 mg/dL (ref 6–23)
CO2: 29 mEq/L (ref 19–32)
Calcium: 9 mg/dL (ref 8.4–10.5)
Chloride: 106 mEq/L (ref 96–112)
Creatinine, Ser: 1.18 mg/dL (ref 0.40–1.50)
GFR: 58.21 mL/min — ABNORMAL LOW (ref >60.00)
Glucose, Bld: 140 mg/dL — ABNORMAL HIGH (ref 70–99)
Potassium: 5.3 mEq/L — ABNORMAL HIGH (ref 3.5–5.1)
Sodium: 142 mEq/L (ref 135–145)

## 2021-09-27 LAB — AEROBIC/ANAEROBIC CULTURE W GRAM STAIN (SURGICAL/DEEP WOUND): Culture: NO GROWTH

## 2021-09-28 ENCOUNTER — Telehealth: Payer: Self-pay | Admitting: Nurse Practitioner

## 2021-09-28 ENCOUNTER — Other Ambulatory Visit: Payer: Self-pay

## 2021-09-28 DIAGNOSIS — E875 Hyperkalemia: Secondary | ICD-10-CM

## 2021-09-28 NOTE — Telephone Encounter (Signed)
Patient instructed per Carl Best, NP to return for a recheck of the BMET on Thursday 09/30/21. Patient agrees to this. Confirmed again that he is not taking Spironolactone. He is taking Furosemide 40 mg daily. He is taking a multivitamin which I have instructed him to stop. Faxed the labs to his PCP. Sent a message to Dr Geoffry Paradise asking for help with managing the hyperkalemia.

## 2021-09-28 NOTE — Telephone Encounter (Signed)
Inbound call from patient, states that he spoke with you earlier today. Wanting to follow up with you about his Poassium. Please advise.

## 2021-09-29 ENCOUNTER — Telehealth: Payer: Self-pay | Admitting: Family Medicine

## 2021-09-29 ENCOUNTER — Other Ambulatory Visit: Payer: Self-pay | Admitting: Family Medicine

## 2021-09-29 MED ORDER — ENTRESTO 24-26 MG PO TABS: 1.0000 | ORAL_TABLET | Freq: Two times a day (BID) | ORAL | 11 refills | Status: DC

## 2021-09-29 NOTE — Telephone Encounter (Signed)
Please tell patient I spoke with cardiology and with ongoing high potassium they are okay with me reducing his Entresto-I sent in a new prescription to his local pharmacy  I will recheck his potassium at next visit in January

## 2021-09-29 NOTE — Telephone Encounter (Signed)
-----   Message from Satira Sark, MD sent at 09/29/2021 10:09 AM EST ----- I think it would be fine to go ahead and cut the Entresto back.  He is also off Aldactone which is reasonable. ----- Message ----- From: [name], MD Sent: 09/29/2021   9:31 AM EST To: Satira Sark, MD  Im also ok checking again one more time to confirm at our lab.  These 3 labs were done at GI.  I have had issues in the past at our office with hyperkalemia that tend to correct when I order the labs specifically under hyperkalemia-I am not sure if they end up processing it differently/more quickly.  Would you prefer that before we make changes?  Happy to set this up from our end ----- Message ----- From: Satira Sark, MD Sent: 09/29/2021   8:11 AM EST To: [name], MD  Thank you for letting me know.  Somewhat unusual that his potassium was 5.4 then 3.9 then 5.3 with no other specific intervention.  If you would like to cut his Entresto back to 24/26 mg twice daily that would be reasonable for now. ----- Message ----- From: [name], MD Sent: 09/28/2021   5:17 PM EST To: Satira Sark, MD, Greggory Keen, LPN  Dr. Kenney Houseman has had hyperkalemia on 2 over the last 3 visits after starting Entresto.  It certainly is rather mild but I would prefer to avoid if possible-  Would you be open to Sacubitril 24 mg/valsartan 26 mg twice daily instead? Since we are not prescribing did not want to change without your input.   Thanks, [name] ----- Message ----- From: Greggory Keen, LPN Sent: 57/32/2025   4:48 PM EST To: [name], MD  We are checking the potassium again 09/30/21. The patient is off Aldactone. Please help manage this hyperkalemia.   Thank you

## 2021-09-29 NOTE — Telephone Encounter (Signed)
Patient notified and verbalized understanding. 

## 2021-09-30 ENCOUNTER — Other Ambulatory Visit: Payer: Medicare HMO

## 2021-09-30 ENCOUNTER — Other Ambulatory Visit (INDEPENDENT_AMBULATORY_CARE_PROVIDER_SITE_OTHER): Payer: Medicare HMO

## 2021-09-30 DIAGNOSIS — E875 Hyperkalemia: Secondary | ICD-10-CM | POA: Diagnosis not present

## 2021-09-30 LAB — BASIC METABOLIC PANEL
BUN: 23 mg/dL (ref 6–23)
CO2: 31 mEq/L (ref 19–32)
Calcium: 8.9 mg/dL (ref 8.4–10.5)
Chloride: 106 mEq/L (ref 96–112)
Creatinine, Ser: 1.14 mg/dL (ref 0.40–1.50)
GFR: 60.67 mL/min (ref >60.00)
Glucose, Bld: 135 mg/dL — ABNORMAL HIGH (ref 70–99)
Potassium: 4.7 mEq/L (ref 3.5–5.1)
Sodium: 141 mEq/L (ref 135–145)

## 2021-10-06 ENCOUNTER — Other Ambulatory Visit: Payer: Self-pay

## 2021-10-06 ENCOUNTER — Ambulatory Visit (HOSPITAL_COMMUNITY)
Admission: RE | Admit: 2021-10-06 | Discharge: 2021-10-06 | Disposition: A | Discharge status: Home / Self Care | Payer: Medicare HMO | Source: Ambulatory Visit | Attending: Family Medicine | Admitting: Family Medicine

## 2021-10-06 ENCOUNTER — Telehealth: Payer: Self-pay

## 2021-10-06 ENCOUNTER — Ambulatory Visit: Payer: Medicare HMO | Admitting: Podiatry

## 2021-10-06 DIAGNOSIS — I350 Nonrheumatic aortic (valve) stenosis: Secondary | ICD-10-CM | POA: Diagnosis not present

## 2021-10-06 LAB — ECHOCARDIOGRAM COMPLETE
AR max vel: 1.14 cm2
AV Area VTI: 1.21 cm2
AV Area mean vel: 1.05 cm2
AV Mean grad: 16 mmHg
AV Peak grad: 25.1 mmHg
Ao pk vel: 2.51 m/s
Area-P 1/2: 4.21 cm2
S' Lateral: 2.9 cm

## 2021-10-06 MED ORDER — PERFLUTREN LIPID MICROSPHERE
1.0000 mL | INTRAVENOUS | Status: AC | PRN
Start: 2021-10-06 — End: 2021-10-06
  Administered 2021-10-06: 16:00:00 2 mL via INTRAVENOUS
  Filled 2021-10-06: qty 10

## 2021-10-06 NOTE — Telephone Encounter (Signed)
Called the patient to set up an office visit with Carl Best. Set up for 10/22/21 at 2:00 pm. Also issues of multiple diarrhea stools a day. He is not on Lactulose. He reports diarrhea for 3 months. Patient reports increased abdominal girth. He feels he is in need of a paracentesis. Last paracentesis was on 09/22/21. Last bmet was 09/30/21.

## 2021-10-06 NOTE — Progress Notes (Signed)
*  PRELIMINARY RESULTS* Echocardiogram 2D Echocardiogram has been performed with [facility].  [name] 10/06/2021, 4:46 PM

## 2021-10-07 ENCOUNTER — Other Ambulatory Visit: Payer: Self-pay

## 2021-10-07 DIAGNOSIS — K7031 Alcoholic cirrhosis of liver with ascites: Secondary | ICD-10-CM

## 2021-10-07 DIAGNOSIS — R197 Diarrhea, unspecified: Secondary | ICD-10-CM

## 2021-10-07 MED ORDER — ALBUMIN HUMAN 25 % IV SOLN: 50.0000 g | Freq: Once | INTRAVENOUS | Status: DC

## 2021-10-07 NOTE — Telephone Encounter (Signed)
Patient contacted and instructed on the plan. He report no diarrhea today. He will come for labs tomorrow while in [place]. Paracentesis is scheduled for 10/12/21 at 2:00 pm.

## 2021-10-07 NOTE — Telephone Encounter (Signed)
[name], pls contact patient, schedule him for a paracentesis, max fluid to be removed 7L, peritoneal fluid labs to include cell count with diff, gram stain and aerobic/anaerobic cultures. Patient to received Albumin 50gm IV post paracentesis per IR, pls enter Albumin order.   Pls send him for CBC, BMP and INR one to two days  before paracentesis   Provide him with a lab order for GI pathogen panel to include C. Diff PCR. Take probiotic of choice once daily.    THX

## 2021-10-08 ENCOUNTER — Ambulatory Visit: Payer: Medicare HMO | Admitting: Podiatry

## 2021-10-08 ENCOUNTER — Other Ambulatory Visit (INDEPENDENT_AMBULATORY_CARE_PROVIDER_SITE_OTHER): Payer: Medicare HMO

## 2021-10-08 ENCOUNTER — Other Ambulatory Visit: Payer: Self-pay

## 2021-10-08 DIAGNOSIS — K7031 Alcoholic cirrhosis of liver with ascites: Secondary | ICD-10-CM

## 2021-10-08 DIAGNOSIS — E1142 Type 2 diabetes mellitus with diabetic polyneuropathy: Secondary | ICD-10-CM

## 2021-10-08 DIAGNOSIS — R197 Diarrhea, unspecified: Secondary | ICD-10-CM

## 2021-10-08 DIAGNOSIS — T148XXA Other injury of unspecified body region, initial encounter: Secondary | ICD-10-CM

## 2021-10-08 LAB — BASIC METABOLIC PANEL
BUN: 22 mg/dL (ref 6–23)
CO2: 28 mEq/L (ref 19–32)
Calcium: 8.6 mg/dL (ref 8.4–10.5)
Chloride: 108 mEq/L (ref 96–112)
Creatinine, Ser: 1.03 mg/dL (ref 0.40–1.50)
GFR: 68.52 mL/min (ref >60.00)
Glucose, Bld: 140 mg/dL — ABNORMAL HIGH (ref 70–99)
Potassium: 3.9 mEq/L (ref 3.5–5.1)
Sodium: 141 mEq/L (ref 135–145)

## 2021-10-08 LAB — CBC WITH DIFFERENTIAL/PLATELET
Basophils Absolute: 0 10*3/uL (ref 0.0–0.1)
Basophils Relative: 0.8 % (ref 0.0–3.0)
Eosinophils Absolute: 0.1 10*3/uL (ref 0.0–0.7)
Eosinophils Relative: 1.6 % (ref 0.0–5.0)
HCT: 30.1 % — ABNORMAL LOW (ref 39.0–52.0)
Hemoglobin: 10 g/dL — ABNORMAL LOW (ref 13.0–17.0)
Lymphocytes Relative: 19.2 % (ref 12.0–46.0)
Lymphs Abs: 0.9 10*3/uL (ref 0.7–4.0)
MCHC: 33.2 g/dL (ref 30.0–36.0)
MCV: 103.6 fl — ABNORMAL HIGH (ref 78.0–100.0)
Monocytes Absolute: 0.3 10*3/uL (ref 0.1–1.0)
Monocytes Relative: 6.5 % (ref 3.0–12.0)
Neutro Abs: 3.4 10*3/uL (ref 1.4–7.7)
Neutrophils Relative %: 71.9 % (ref 43.0–77.0)
Platelets: 99 10*3/uL — ABNORMAL LOW (ref 150.0–400.0)
RBC: 2.9 Mil/uL — ABNORMAL LOW (ref 4.22–5.81)
RDW: 17.7 % — ABNORMAL HIGH (ref 11.5–15.5)
WBC: 4.7 10*3/uL (ref 4.0–10.5)

## 2021-10-08 LAB — PROTIME-INR
INR: 1.1
Prothrombin Time: 11 s (ref 9.0–11.5)

## 2021-10-12 ENCOUNTER — Ambulatory Visit (HOSPITAL_COMMUNITY)
Admission: RE | Admit: 2021-10-12 | Discharge: 2021-10-12 | Disposition: A | Discharge status: Home / Self Care | Payer: Medicare HMO | Source: Ambulatory Visit | Attending: Nurse Practitioner | Admitting: Nurse Practitioner

## 2021-10-12 ENCOUNTER — Other Ambulatory Visit: Payer: Self-pay

## 2021-10-12 DIAGNOSIS — K7031 Alcoholic cirrhosis of liver with ascites: Secondary | ICD-10-CM

## 2021-10-12 DIAGNOSIS — R188 Other ascites: Secondary | ICD-10-CM | POA: Diagnosis not present

## 2021-10-12 HISTORY — PX: IR PARACENTESIS: IMG2679

## 2021-10-12 LAB — BODY FLUID CELL COUNT WITH DIFFERENTIAL
Eos, Fluid: 0 %
Lymphs, Fluid: 30 %
Monocyte-Macrophage-Serous Fluid: 61 % (ref 50–90)
Neutrophil Count, Fluid: 9 % (ref 0–25)
Total Nucleated Cell Count, Fluid: 161 cu mm (ref 0–1000)

## 2021-10-12 MED ORDER — LIDOCAINE HCL 1 % IJ SOLN
INTRAMUSCULAR | Status: AC
  Filled 2021-10-12: qty 20

## 2021-10-12 MED ORDER — ALBUMIN HUMAN 25 % IV SOLN
INTRAVENOUS | Status: AC
  Filled 2021-10-12: qty 200

## 2021-10-12 MED ORDER — LIDOCAINE HCL 1 % IJ SOLN
INTRAMUSCULAR | Status: DC | PRN
  Administered 2021-10-12: 20 mL

## 2021-10-12 MED ORDER — ALBUMIN HUMAN 25 % IV SOLN
50.0000 g | Freq: Once | INTRAVENOUS | Status: AC
  Administered 2021-10-12: 50 g via INTRAVENOUS
  Filled 2021-10-12: qty 200

## 2021-10-12 NOTE — Procedures (Signed)
PROCEDURE SUMMARY:  Successful US guided paracentesis from right abdomen.  Yielded 6.8 L of clear yellow fluid.  No immediate complications.  Pt tolerated well.   Specimen sent for labs.  EBL < 2 mL  Theresa Duty, NP 10/12/2021 4:08 PM

## 2021-10-13 ENCOUNTER — Other Ambulatory Visit: Payer: Self-pay

## 2021-10-13 ENCOUNTER — Inpatient Hospital Stay: Payer: Medicare HMO | Attending: Hematology

## 2021-10-13 ENCOUNTER — Inpatient Hospital Stay: Payer: Medicare HMO | Admitting: Hematology

## 2021-10-13 VITALS — BP 111/58 | HR 81 | Temp 97.5°F | Resp 18 | Wt 181.7 lb

## 2021-10-13 DIAGNOSIS — D649 Anemia, unspecified: Secondary | ICD-10-CM | POA: Diagnosis not present

## 2021-10-13 DIAGNOSIS — C220 Liver cell carcinoma: Secondary | ICD-10-CM | POA: Diagnosis not present

## 2021-10-13 DIAGNOSIS — E538 Deficiency of other specified B group vitamins: Secondary | ICD-10-CM | POA: Diagnosis not present

## 2021-10-13 DIAGNOSIS — D696 Thrombocytopenia, unspecified: Secondary | ICD-10-CM | POA: Diagnosis not present

## 2021-10-13 DIAGNOSIS — Z79899 Other long term (current) drug therapy: Secondary | ICD-10-CM | POA: Diagnosis not present

## 2021-10-13 DIAGNOSIS — K7460 Unspecified cirrhosis of liver: Secondary | ICD-10-CM | POA: Insufficient documentation

## 2021-10-13 LAB — CMP (CANCER CENTER ONLY)
ALT: 7 U/L (ref 0–44)
AST: 23 U/L (ref 15–41)
Albumin: 2.9 g/dL — ABNORMAL LOW (ref 3.5–5.0)
Alkaline Phosphatase: 145 U/L — ABNORMAL HIGH (ref 38–126)
Anion gap: 6 (ref 5–15)
BUN: 18 mg/dL (ref 8–23)
CO2: 24 mmol/L (ref 22–32)
Calcium: 8.3 mg/dL — ABNORMAL LOW (ref 8.9–10.3)
Chloride: 111 mmol/L (ref 98–111)
Creatinine: 0.94 mg/dL (ref 0.61–1.24)
GFR, Estimated: >60 mL/min (ref >60)
Glucose, Bld: 111 mg/dL — ABNORMAL HIGH (ref 70–99)
Potassium: 4.2 mmol/L (ref 3.5–5.1)
Sodium: 141 mmol/L (ref 135–145)
Total Bilirubin: 0.6 mg/dL (ref 0.3–1.2)
Total Protein: 5.7 g/dL — ABNORMAL LOW (ref 6.5–8.1)

## 2021-10-13 LAB — CBC WITH DIFFERENTIAL (CANCER CENTER ONLY)
Abs Immature Granulocytes: 0.03 10*3/uL (ref 0.00–0.07)
Basophils Absolute: 0 10*3/uL (ref 0.0–0.1)
Basophils Relative: 1 %
Eosinophils Absolute: 0 10*3/uL (ref 0.0–0.5)
Eosinophils Relative: 1 %
HCT: 28.1 % — ABNORMAL LOW (ref 39.0–52.0)
Hemoglobin: 9.2 g/dL — ABNORMAL LOW (ref 13.0–17.0)
Immature Granulocytes: 1 %
Lymphocytes Relative: 23 %
Lymphs Abs: 0.9 10*3/uL (ref 0.7–4.0)
MCH: 33.9 pg (ref 26.0–34.0)
MCHC: 32.7 g/dL (ref 30.0–36.0)
MCV: 103.7 fL — ABNORMAL HIGH (ref 80.0–100.0)
Monocytes Absolute: 0.3 10*3/uL (ref 0.1–1.0)
Monocytes Relative: 8 %
Neutro Abs: 2.4 10*3/uL (ref 1.7–7.7)
Neutrophils Relative %: 66 %
Platelet Count: 62 10*3/uL — ABNORMAL LOW (ref 150–400)
RBC: 2.71 MIL/uL — ABNORMAL LOW (ref 4.22–5.81)
RDW: 16.9 % — ABNORMAL HIGH (ref 11.5–15.5)
WBC Count: 3.7 10*3/uL — ABNORMAL LOW (ref 4.0–10.5)
nRBC: 0 % (ref 0.0–0.2)

## 2021-10-13 LAB — IRON AND IRON BINDING CAPACITY (CC-WL,HP ONLY)
Iron: 38 ug/dL — ABNORMAL LOW (ref 45–182)
Saturation Ratios: 23 % (ref 17.9–39.5)
TIBC: 167 ug/dL — ABNORMAL LOW (ref 250–450)
UIBC: 129 ug/dL (ref 117–376)

## 2021-10-13 LAB — FERRITIN: Ferritin: 186 ng/mL (ref 24–336)

## 2021-10-13 LAB — PATHOLOGIST SMEAR REVIEW

## 2021-10-13 NOTE — Progress Notes (Signed)
Subjective:  Patient ID: [name], male    DOB: [birth year],  MRN: [id number]  Chief Complaint  Patient presents with   Wound Check    Right foot under the hallux pt has a small wound     81 y.o. male presents with the above complaint.  Patient presents with complaint of right submetatarsal 1 small blister.  He is a diabetic.  He just received his new diabetic shoes that could have been the culprit.  He wants to make sure that there is no wound or clinical signs of infection present.  He states is hurting a little bit/discomfort.  He denies any other acute complaints he has not seen anyone else prior to seeing me for this.     Review of Systems: Negative except as noted in the HPI. Denies N/V/F/Ch.  Past Medical History:  Diagnosis Date   Benign prostatic hypertrophy    Blood transfusion without reported diagnosis    CHF (congestive heart failure) ([place])    a. EF 20-25% by echo in 11/2019   COPD (chronic obstructive pulmonary disease) ([place]) 12/15/2009   FEV1 2.30 (70%) ratio 63 no better with B2 and DLCO 18/6 (73%) corrects to 106%   Degenerative joint disease    Depression    Diverticulosis    Esophageal varices (HCC)    Essential hypertension    ACE inhibitor cough   Fatty liver    Hepatocellular carcinoma (HCC)    Hyperlipidemia    Internal hemorrhoids    Liver cancer ([place])    Low back pain    Otitis externa 07/30/2012   Portal hypertensive gastropathy (HCC)    Splenomegaly    Thrombocytopenia (HCC)    Type 2 diabetes mellitus (HCC)    Ulcer of foot (HCC)    Right foot    Current Outpatient Medications:    albuterol (VENTOLIN HFA) 108 (90 Base) MCG/ACT inhaler, Inhale 2 puffs into the lungs every 6 (six) hours as needed for wheezing or shortness of breath., Disp: 6.7 g, Rfl: 3   Alcohol Swabs (DROPSAFE ALCOHOL PREP) 70 % PADS, USE  UP  TO FOUR TIMES DAILY AS DIRECTED, Disp: 100 each, Rfl: 3   aspirin EC 81 MG tablet, Take 81 mg by mouth daily., Disp: , Rfl:     blood glucose meter kit and supplies KIT, Dispense based on patient and insurance preference. Use up to four times daily as directed., Disp: 1 each, Rfl: 0   Blood Glucose Monitoring Suppl (TRUE METRIX AIR GLUCOSE METER) w/Device KIT, Use to check blood sugars daily. Dx: E11.9, Disp: 1 kit, Rfl: 3   busPIRone (BUSPAR) 7.5 MG tablet, Take 1 tablet (7.5 mg total) by mouth 2 (two) times daily as needed. for anxiety (Patient taking differently: Take 7.5 mg by mouth at bedtime.), Disp: 180 tablet, Rfl: 3   carvedilol (COREG) 6.25 MG tablet, Take 1 tablet (6.25 mg total) by mouth 2 (two) times daily with a meal., Disp: 180 tablet, Rfl: 1   escitalopram (LEXAPRO) 5 MG tablet, Take 1 tablet (5 mg total) by mouth daily., Disp: 90 tablet, Rfl: 3   finasteride (PROSCAR) 5 MG tablet, TAKE 1 TABLET EVERY DAY, Disp: 90 tablet, Rfl: 1   furosemide (LASIX) 20 MG tablet, Take 2 tablets (40 mg total) by mouth daily., Disp: 90 tablet, Rfl: 1   gabapentin (NEURONTIN) 300 MG capsule, TAKE 1 CAPSULE TWICE DAILY (Patient taking differently: Take 300 mg by mouth 2 (two) times daily.), Disp: 180 capsule,  Rfl: 3   glucose blood (TRUE METRIX BLOOD GLUCOSE TEST) test strip, TEST BLOOD SUGAR  UP  TO FOUR TIMES DAILY AS DIRECTED, Disp: 100 strip, Rfl: 1   loperamide (IMODIUM) 2 MG capsule, Take 1 capsule (2 mg total) by mouth 3 (three) times daily between meals as needed for diarrhea or loose stools., Disp: 30 capsule, Rfl: 0   Multiple Vitamin (MULTIVITAMIN WITH MINERALS) TABS tablet, Take 1 tablet by mouth daily., Disp: 30 tablet, Rfl: 0   Polyvinyl Alcohol-Povidone (REFRESH OP), Place 1 drop into both eyes at bedtime., Disp: , Rfl:    rosuvastatin (CRESTOR) 10 MG tablet, TAKE 1 TABLET EVERY DAY, Disp: 90 tablet, Rfl: 3   sacubitril-valsartan (ENTRESTO) 24-26 MG, Take 1 tablet by mouth 2 (two) times daily., Disp: 60 tablet, Rfl: 11   tamsulosin (FLOMAX) 0.4 MG CAPS capsule, TAKE 1 CAPSULE EVERY DAY (Patient taking differently:  Take 0.4 mg by mouth daily.), Disp: 90 capsule, Rfl: 1   TRUEplus Lancets 28G MISC, TEST BLOOD SUGAR  UP  TO FOUR TIMES DAILY AS DIRECTED, Disp: 100 each, Rfl: 3  Current Facility-Administered Medications:    albumin human 25 % solution 50 g, 50 g, Intravenous, Once, Noralyn Pick, NP  Social History   Tobacco Use  Smoking Status Former   Packs/day: 4.00   Years: 20.00   Pack years: 80.00   Types: Cigarettes   Quit date: 09/18/1984   Years since quitting: 37.0  Smokeless Tobacco Never  Tobacco Comments   quit in 1980    No Known Allergies Objective:  There were no vitals filed for this visit. There is no height or weight on file to calculate BMI. Constitutional Well developed. Well nourished.  Vascular Dorsalis pedis pulses palpable bilaterally. Posterior tibial pulses palpable bilaterally. Capillary refill normal to all digits.  No cyanosis or clubbing noted. Pedal hair growth normal.  Neurologic Normal speech. Oriented to person, place, and time. Epicritic sensation to light touch grossly present bilaterally.  Dermatologic Superficial blister formation noted to right submetatarsal 1 likely due to friction.  Limited breakdown of the skin noted.  No deep ulceration noted.  No clinical signs of infection noted.  Orthopedic: Normal joint ROM without pain or crepitus bilaterally. No visible deformities. No bony tenderness.   Radiographs: None Assessment:   1. Diabetic peripheral neuropathy associated with type 2 diabetes mellitus ([place])   2. Friction blister    Plan:  Patient was evaluated and treated and all questions answered.  Right submetatarsal 1 blister with underlying limited breakdown of the skin in the setting of diabetes -I explained to the patient the etiology of blister formation various treatment options were discussed. -I encouraged them to keep covered with triple antibiotic and a Band-Aid. -It may have been a diabetic shoes adjustment if  there is no improvement and continues to friction blister.  I discussed this with the patient he states understanding.  No follow-ups on file.

## 2021-10-14 ENCOUNTER — Other Ambulatory Visit: Payer: Self-pay

## 2021-10-14 ENCOUNTER — Other Ambulatory Visit: Payer: Medicare HMO

## 2021-10-14 DIAGNOSIS — R197 Diarrhea, unspecified: Secondary | ICD-10-CM

## 2021-10-15 ENCOUNTER — Telehealth: Payer: Self-pay

## 2021-10-15 NOTE — Telephone Encounter (Deleted)
Medical records and imaging report faxed to [facility]. Request appointment for consideration of sigmoid resection.

## 2021-10-16 LAB — CLOSTRIDIUM DIFFICILE BY PCR: Toxigenic C. Difficile by PCR: NEGATIVE

## 2021-10-18 ENCOUNTER — Other Ambulatory Visit: Payer: Self-pay

## 2021-10-18 ENCOUNTER — Telehealth: Payer: Self-pay | Admitting: Nurse Practitioner

## 2021-10-18 ENCOUNTER — Telehealth: Payer: Self-pay | Admitting: Hematology

## 2021-10-18 LAB — GI PROFILE, STOOL, PCR

## 2021-10-18 LAB — AEROBIC/ANAEROBIC CULTURE W GRAM STAIN (SURGICAL/DEEP WOUND): Culture: NO GROWTH

## 2021-10-18 MED ORDER — SPIRONOLACTONE 50 MG PO TABS
50.0000 mg | ORAL_TABLET | Freq: Every day | ORAL | 1 refills | Status: DC
Start: 2021-10-18 — End: 2021-10-26

## 2021-10-18 NOTE — Telephone Encounter (Signed)
Scheduled follow-up appointment per 1/4 los. Patient is aware. °

## 2021-10-18 NOTE — Telephone Encounter (Signed)
Patients daughter called and stated that he had been taken off the fluid pill about 1 to 2 weeks ago and potassium was too low. Now potassium is good and needs to go back on the pill. Daughter stated they need a new refill due to not being able to find the proscription. Please advise.

## 2021-10-18 NOTE — Telephone Encounter (Signed)
Noralyn Pick, NP  [name], Real Cons, LPN [name], pls contact patient and have him add back Spironolactone 50mg  QD. I believe he still has Spironolactone 25mg  tabs so he should take 2 tabs po QD. If he needs a new RX we can change RX to Spironolactone 50mg  one tab po QD # 30, 1 refill.   Pls send him back to the lab one week after he restarts Spironolactone 50mg  QD.   We will monitor his K+ and kidney function closely, hopefully will be able to incrementally increase his diuretic doses to maximize his diuresis, hopefully reduce his ascites reaccumulation.    Rx to the patient's pharmacy. Confirmed with the patient To use [facility] in [place].

## 2021-10-19 ENCOUNTER — Other Ambulatory Visit: Payer: Self-pay | Admitting: Nurse Practitioner

## 2021-10-19 ENCOUNTER — Encounter: Payer: Self-pay | Admitting: Hematology

## 2021-10-19 NOTE — Progress Notes (Addendum)
HEMATOLOGY/ONCOLOGY CLINIC NOTE  Date of Service:.10/13/2021   Patient Care Team: [name], MD as PCP - General (Family Medicine) Satira Sark, MD as PCP - Cardiology (Cardiology) Marilynne Halsted, MD as Referring Physician (Ophthalmology) Brunetta Genera, MD as Consulting Physician (Hematology) Pyrtle, Lajuan Lines, MD as Consulting Physician (Gastroenterology) Marzetta Board, DPM as Consulting Physician (Podiatry) Madelin Rear, [facility] as Pharmacist (Pharmacist)  CHIEF COMPLAINTS/PURPOSE OF CONSULTATION:  F/u for [facility]  HISTORY OF PRESENTING ILLNESS:   [name] is a wonderful 81 y.o. 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 He is accompanied today by his wife. The pt reports that he is doing well overall.   The pt reports that he has had low energy levels from the past 3-4 years since he retired. Today he feels fatigued as well.  He has been taking Metformin for the last 10 years and has recently begun replacing Vitamin B12. He has neuropathy in his feet and has experienced some falls and uses a cane at night. He is also taking Finasteride  Most recent lab results (02/28/18) of CBC  is as follows: all values are WNL except for RBC at 3.42, HGB at 11.4, HCT at 33.5, Platelets at 127k. Testosterone 02/13/18 was lower at 185  On review of systems, pt reports neuropathy in his feet, some fatigue, stable back pain, some ankle swelling, and denies abdominal pains and any other symptoms.   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   Interval History:   [name] is here for follow-up of his hepatocellular carcinoma.  He is following with gastroenterology and has repeated paracentesis for management of his symptomatic ascites. He notes no fevers  no chills. No focal abdominal pain. Continues to be on diuretics for his ascites and cardiac issues. Labs done today reviewed with the patient.   MEDICAL HISTORY:  Past Medical History:  Diagnosis Date   Benign prostatic hypertrophy    Blood transfusion without reported diagnosis    CHF (congestive heart failure) ([place])    a. EF 20-25% by echo in 11/2019   COPD (chronic obstructive pulmonary disease) ([place]) 12/15/2009   FEV1 2.30 (70%) ratio 63 no better with B2 and DLCO 18/6 (73%) corrects to 106%   Degenerative joint disease    Depression    Diverticulosis    Esophageal varices (HCC)    Essential hypertension    ACE inhibitor cough   Fatty liver    Hepatocellular carcinoma (HCC)    Hyperlipidemia    Internal hemorrhoids    Liver cancer ([place])    Low back pain    Otitis externa 07/30/2012   Portal hypertensive gastropathy (HCC)    Splenomegaly    Thrombocytopenia (HCC)    Type 2 diabetes mellitus ([place])    Ulcer of foot ([place])    Right foot    SURGICAL HISTORY: Past Surgical History:  Procedure Laterality Date   BIOPSY  07/14/2021   Procedure: BIOPSY;  Surgeon: Daryel November, MD;  Location: [place];  Service: Gastroenterology;;  EGD and COLON   COLONOSCOPY  2020   JMP-MAC-plenvu (good)-polyps-recall 1 yr   COLONOSCOPY WITH PROPOFOL N/A 07/14/2021   Procedure: COLONOSCOPY WITH PROPOFOL;  Surgeon: Daryel November, MD;  Location: [facility] ENDOSCOPY;  Service: Gastroenterology;  Laterality: N/A;   ESOPHAGOGASTRODUODENOSCOPY (EGD) WITH PROPOFOL N/A 07/14/2021   Procedure: ESOPHAGOGASTRODUODENOSCOPY (EGD) WITH PROPOFOL;  Surgeon: Daryel November, MD;  Location: [place];  Service: Gastroenterology;  Laterality: N/A;   IR EMBO TUMOR ORGAN ISCHEMIA INFARCT INC GUIDE ROADMAPPING  06/01/2020   IR PARACENTESIS  08/30/2021   IR PARACENTESIS  09/22/2021   IR PARACENTESIS  10/12/2021   IR RADIOLOGIST EVAL & MGMT  04/28/2020   IR RADIOLOGIST EVAL & MGMT  07/15/2020   IR  RADIOLOGIST EVAL & MGMT  05/04/2021   NO PAST SURGERIES  1980    SOCIAL HISTORY: Social History   Socioeconomic History   Marital status: Unknown    Spouse name: Not on file   Number of children: Not on file   Years of education: Not on file   Highest education level: Not on file  Occupational History   Occupation: retired    Comment: maintenence work  Tobacco Use   Smoking status: Former    Packs/day: 4.00    Years: 20.00    Pack years: 80.00    Types: Cigarettes    Quit date: 09/18/1984    Years since quitting: 37.1   Smokeless tobacco: Never   Tobacco comments:    quit in 1980  Vaping Use   Vaping Use: Never used  Substance and Sexual Activity   Alcohol use: No    Comment: no drinking since 40 years plus   Drug use: No   Sexual activity: Yes  Other Topics Concern   Not on file  Social History Narrative   ** Merged History Encounter **       Married 53 years in 2015. 4 kids (2 sons) and 3 grandkids.    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            Social Determinants of Radio broadcast assistant Strain: Low Risk    Difficulty of Paying Living Expenses: Not hard at all  Food Insecurity: No Food Insecurity   Worried About Charity fundraiser in the Last Year: Never true   Arboriculturist in the Last Year: Never true  Transportation Needs: No Transportation Needs   Lack of Transportation (Medical): No   Lack of Transportation (Non-Medical): No  Physical Activity: Inactive   Days of Exercise per Week: 0 days   Minutes of Exercise per Session: 0 min  Stress: No Stress Concern Present   Feeling of Stress : Only a little  Social Connections: Moderately Integrated   Frequency of Communication with Friends and Family: More than three times a week   Frequency of Social Gatherings with Friends and Family: More than three times a week   Attends Religious Services: More  than 4 times per year   Active Member of [employer] or Organizations: No   Attends Music therapist: Never   Marital Status: Married  Human resources officer Violence: Not At Risk   Fear of Current or Ex-Partner: No   Emotionally Abused: No   Physically Abused: No   Sexually Abused: No    FAMILY HISTORY: Family History  Problem Relation Age of Onset   Melanoma Mother    Stroke Father    CAD Brother        Premature disease   Colon polyps Neg Hx    Colon cancer Neg  Hx    Esophageal cancer Neg Hx    Stomach cancer Neg Hx    Rectal cancer Neg Hx     ALLERGIES:  has No Known Allergies.  MEDICATIONS:  Current Outpatient Medications  Medication Sig Dispense Refill   albuterol (VENTOLIN HFA) 108 (90 Base) MCG/ACT inhaler Inhale 2 puffs into the lungs every 6 (six) hours as needed for wheezing or shortness of breath. 6.7 g 3   Alcohol Swabs (DROPSAFE ALCOHOL PREP) 70 % PADS USE  UP  TO FOUR TIMES DAILY AS DIRECTED 100 each 3   aspirin EC 81 MG tablet Take 81 mg by mouth daily.     blood glucose meter kit and supplies KIT Dispense based on patient and insurance preference. Use up to four times daily as directed. 1 each 0   Blood Glucose Monitoring Suppl (TRUE METRIX AIR GLUCOSE METER) w/Device KIT Use to check blood sugars daily. Dx: E11.9 1 kit 3   busPIRone (BUSPAR) 7.5 MG tablet Take 1 tablet (7.5 mg total) by mouth 2 (two) times daily as needed. for anxiety (Patient taking differently: Take 7.5 mg by mouth at bedtime.) 180 tablet 3   carvedilol (COREG) 6.25 MG tablet Take 1 tablet (6.25 mg total) by mouth 2 (two) times daily with a meal. 180 tablet 1   escitalopram (LEXAPRO) 5 MG tablet Take 1 tablet (5 mg total) by mouth daily. 90 tablet 3   famotidine (PEPCID) 20 MG tablet Take 20 mg by mouth 2 (two) times daily.     finasteride (PROSCAR) 5 MG tablet TAKE 1 TABLET EVERY DAY 90 tablet 1   furosemide (LASIX) 20 MG tablet Take 2 tablets (40 mg total) by mouth daily. 90 tablet 1    gabapentin (NEURONTIN) 300 MG capsule TAKE 1 CAPSULE TWICE DAILY 180 capsule 3   glucose blood (TRUE METRIX BLOOD GLUCOSE TEST) test strip TEST BLOOD SUGAR  UP  TO FOUR TIMES DAILY AS DIRECTED 100 strip 1   loperamide (IMODIUM) 2 MG capsule Take 1 capsule (2 mg total) by mouth 3 (three) times daily between meals as needed for diarrhea or loose stools. 30 capsule 0   metFORMIN (GLUCOPHAGE) 500 MG tablet Take 500 mg by mouth 3 (three) times daily.     Multiple Vitamin (MULTIVITAMIN WITH MINERALS) TABS tablet Take 1 tablet by mouth daily. 30 tablet 0   Polyvinyl Alcohol-Povidone (REFRESH OP) Place 1 drop into both eyes at bedtime.     rosuvastatin (CRESTOR) 10 MG tablet TAKE 1 TABLET EVERY DAY 90 tablet 3   sacubitril-valsartan (ENTRESTO) 24-26 MG Take 1 tablet by mouth 2 (two) times daily. 180 tablet 3   spironolactone (ALDACTONE) 50 MG tablet Take 1 tablet (50 mg total) by mouth daily. 30 tablet 1   tamsulosin (FLOMAX) 0.4 MG CAPS capsule TAKE 1 CAPSULE EVERY DAY 90 capsule 1   TRUEplus Lancets 28G MISC TEST BLOOD SUGAR  UP  TO FOUR TIMES DAILY AS DIRECTED 100 each 3   Current Facility-Administered Medications  Medication Dose Route Frequency Provider Last Rate Last Admin   albumin human 25 % solution 50 g  50 g Intravenous Once Noralyn Pick, NP        REVIEW OF SYSTEMS:   .10 Point review of Systems was done is negative except as noted above.  PHYSICAL EXAMINATION:  Vitals:   10/13/21 0935  BP: (!) 111/58  Pulse: 81  Resp: 18  Temp: (!) 97.5 F (36.4 C)  SpO2: 100%    Filed  Weights   10/13/21 0935  Weight: 181 lb 11.2 oz (82.4 kg)    .Body mass index is 25.34 kg/m.  Marland Kitchen GENERAL:alert, in no acute distress and comfortable SKIN: no acute rashes, no significant lesions EYES: conjunctiva are pink and non-injected, sclera anicteric OROPHARYNX: MMM, no exudates, no oropharyngeal erythema or ulceration NECK: supple, no JVD LYMPH:  no palpable lymphadenopathy in the  cervical, axillary or inguinal regions LUNGS: clear to auscultation b/l with normal respiratory effort HEART: regular rate & rhythm ABDOMEN:  normoactive bowel sounds , non tender, not distended. Extremity: 2+ pedal edema PSYCH: alert & oriented x 3 with fluent speech NEURO: no focal motor/sensory deficits   LABORATORY DATA:  I have reviewed the data as listed Component     Latest Ref Rng & Units 10/13/2021  WBC     4.0 - 10.5 K/uL 3.7 (L)  RBC     4.22 - 5.81 MIL/uL 2.71 (L)  Hemoglobin     13.0 - 17.0 g/dL 9.2 (L)  HCT     39.0 - 52.0 % 28.1 (L)  MCV     80.0 - 100.0 fL 103.7 (H)  MCH     26.0 - 34.0 pg 33.9  MCHC     30.0 - 36.0 g/dL 32.7  RDW     11.5 - 15.5 % 16.9 (H)  Platelets     150 - 400 K/uL 62 (L)  nRBC     0.0 - 0.2 % 0.0  Neutrophils     % 66  NEUT#     1.7 - 7.7 K/uL 2.4  Lymphocytes     % 23  Lymphocyte #     0.7 - 4.0 K/uL 0.9  Monocytes Relative     % 8  Monocyte #     0.1 - 1.0 K/uL 0.3  Eosinophil     % 1  Eosinophils Absolute     0.0 - 0.5 K/uL 0.0  Basophil     % 1  Basophils Absolute     0.0 - 0.1 K/uL 0.0  WBC Morphology      MORPHOLOGY UNREMARKABLE  Smear Review      FEW LARGE PLTS  Immature Granulocytes     % 1  Abs Immature Granulocytes     0.00 - 0.07 K/uL 0.03  Ovalocytes      PRESENT  Sodium     135 - 145 mmol/L 141  Potassium     3.5 - 5.1 mmol/L 4.2  Chloride     98 - 111 mmol/L 111  CO2     22 - 32 mmol/L 24  Glucose     70 - 99 mg/dL 111 (H)  BUN     8 - 23 mg/dL 18  Creatinine     0.61 - 1.24 mg/dL 0.94  Calcium     8.9 - 10.3 mg/dL 8.3 (L)  Total Protein     6.5 - 8.1 g/dL 5.7 (L)  Albumin     3.5 - 5.0 g/dL 2.9 (L)  AST     15 - 41 U/L 23  ALT     0 - 44 U/L 7  Alkaline Phosphatase     38 - 126 U/L 145 (H)  Total Bilirubin     0.3 - 1.2 mg/dL 0.6  GFR, Est Non African American     >60 mL/min >60  Anion gap     5 - 15 6  Ferritin     24 - 336  ng/mL 186     02/28/18 Surgical  Pathology:   02/28/18 Cytogenetics:    RADIOGRAPHIC STUDIES: I have personally reviewed the radiological images as listed and agreed with the findings in the report. ECHOCARDIOGRAM COMPLETE  Result Date: 10/06/2021    ECHOCARDIOGRAM REPORT   Patient Name:   [name] Date of Exam: 10/06/2021 Medical Rec #:  [id number]      Height:       71.0 in Accession #:    [id number]     Weight:       196.0 lb Date of Birth:  1940-12-07      BSA:          2.091 m Patient Age:    34 years       BP:           116/69 mmHg Patient Gender: M              HR:           96 bpm. Exam Location:  [facility] Procedure: 2D Echo, Cardiac Doppler and Color Doppler Indications:    I35.0 (ICD-10-CM) - Aortic valve stenosis, etiology of cardiac                 valve disease unspecified  History:        Patient has prior history of Echocardiogram examinations, most                 recent 06/17/2020. CHF, COPD; Risk Factors:Hypertension, Diabetes                 and Dyslipidemia. CKD (chronic kidney disease), stage III,                 Hepatocellular carcinoma.  Sonographer:    Alvino Chapel RCS Referring Phys: [id number] Verta Ellen.  Sonographer Comments: Image acquisition challenging due to patient body habitus. IMPRESSIONS  1. Left ventricular ejection fraction, by estimation, is 55%. The left ventricle has normal function. The left ventricle has no regional wall motion abnormalities. There is mild left ventricular hypertrophy. Left ventricular diastolic parameters are consistent with Grade I diastolic dysfunction (impaired relaxation).  2. Right ventricular systolic function is normal. The right ventricular size is normal.  3. Left atrial size was mildly dilated.  4. The mitral valve is abnormal. No evidence of mitral valve regurgitation. No evidence of mitral stenosis. Moderate mitral annular calcification.  5. Gradients valve area and DVI similar to echo done 06/17/20. The aortic valve is calcified. Aortic valve regurgitation is  not visualized. Moderate aortic valve stenosis.  6. The inferior vena cava is normal in size with greater than 50% respiratory variability, suggesting right atrial pressure of 3 mmHg. FINDINGS  Left Ventricle: Left ventricular ejection fraction, by estimation, is 55%. The left ventricle has normal function. The left ventricle has no regional wall motion abnormalities. Definity contrast agent was given IV to delineate the left ventricular endocardial borders. The left ventricular internal cavity size was normal in size. There is mild left ventricular hypertrophy. Left ventricular diastolic parameters are consistent with Grade I diastolic dysfunction (impaired relaxation). Right Ventricle: The right ventricular size is normal. No increase in right ventricular wall thickness. Right ventricular systolic function is normal. Left Atrium: Left atrial size was mildly dilated. Right Atrium: Right atrial size was normal in size. Pericardium: There is no evidence of pericardial effusion. Mitral Valve: The mitral valve is abnormal. There is moderate thickening of the mitral valve leaflet(s). There  is moderate calcification of the mitral valve leaflet(s). Moderate mitral annular calcification. No evidence of mitral valve regurgitation. No evidence of mitral valve stenosis. Tricuspid Valve: The tricuspid valve is normal in structure. Tricuspid valve regurgitation is not demonstrated. No evidence of tricuspid stenosis. Aortic Valve: Gradients valve area and DVI similar to echo done 06/17/20. The aortic valve is calcified. Aortic valve regurgitation is not visualized. Moderate aortic stenosis is present. Aortic valve mean gradient measures 16.0 mmHg. Aortic valve peak gradient measures 25.1 mmHg. Aortic valve area, by VTI measures 1.21 cm. Pulmonic Valve: The pulmonic valve was normal in structure. Pulmonic valve regurgitation is not visualized. No evidence of pulmonic stenosis. Aorta: The aortic root is normal in size and  structure. Venous: The inferior vena cava is normal in size with greater than 50% respiratory variability, suggesting right atrial pressure of 3 mmHg. IAS/Shunts: The interatrial septum was not well visualized.  LEFT VENTRICLE PLAX 2D LVIDd:         4.80 cm   Diastology LVIDs:         2.90 cm   LV e' medial:    6.31 cm/s LV PW:         1.00 cm   LV E/e' medial:  9.5 LV IVS:        1.30 cm   LV e' lateral:   5.98 cm/s LVOT diam:     2.10 cm   LV E/e' lateral: 10.1 LV SV:         68 LV SV Index:   32 LVOT Area:     3.46 cm  RIGHT VENTRICLE RV S prime:     12.15 cm/s TAPSE (M-mode): 1.5 cm LEFT ATRIUM             Index        RIGHT ATRIUM           Index LA diam:        2.80 cm 1.34 cm/m   RA Area:     11.80 cm LA Vol (A2C):   50.1 ml 23.97 ml/m  RA Volume:   21.10 ml  10.09 ml/m LA Vol (A4C):   89.7 ml 42.91 ml/m LA Biplane Vol: 68.4 ml 32.72 ml/m  AORTIC VALVE AV Area (Vmax):    1.14 cm AV Area (Vmean):   1.05 cm AV Area (VTI):     1.21 cm AV Vmax:           250.50 cm/s AV Vmean:          187.500 cm/s AV VTI:            0.560 m AV Peak Grad:      25.1 mmHg AV Mean Grad:      16.0 mmHg LVOT Vmax:         82.40 cm/s LVOT Vmean:        56.800 cm/s LVOT VTI:          0.196 m LVOT/AV VTI ratio: 0.35  AORTA Ao Root diam: 3.80 cm MITRAL VALVE MV Area (PHT): 4.21 cm     SHUNTS MV Decel Time: 180 msec     Systemic VTI:  0.20 m MV E velocity: 60.20 cm/s   Systemic Diam: 2.10 cm MV A velocity: 100.00 cm/s MV E/A ratio:  0.60 Jenkins Rouge MD Electronically signed by Jenkins Rouge MD Signature Date/Time: 10/06/2021/5:46:35 PM    Final    IR Paracentesis  Result Date: 10/12/2021 INDICATION: Patient with a history of cirrhosis and hepatocellular  carcinoma presents today with ascites. Interventional radiology asked to perform a diagnostic and therapeutic paracentesis up to 7 L. EXAM: ULTRASOUND GUIDED PARACENTESIS MEDICATIONS: 1% lidocaine 10 mL COMPLICATIONS: None immediate. PROCEDURE: Informed written consent was  obtained from the patient after a discussion of the risks, benefits and alternatives to treatment. A timeout was performed prior to the initiation of the procedure. Initial ultrasound scanning demonstrates a large amount of ascites within the right lower abdominal quadrant. The right lower abdomen was prepped and draped in the usual sterile fashion. 1% lidocaine was used for local anesthesia. Following this, a 19 gauge, 7-cm, Yueh catheter was introduced. An ultrasound image was saved for documentation purposes. The paracentesis was performed. The catheter was removed and a dressing was applied. The patient tolerated the procedure well without immediate post procedural complication. Patient received post-procedure intravenous albumin; see nursing notes for details. FINDINGS: A total of approximately 6.8 L of clear yellow fluid was removed. Samples were sent to the laboratory as requested by the clinical team. IMPRESSION: Successful ultrasound-guided paracentesis yielding 6.8 liters of peritoneal fluid. Read by: Soyla Dryer, NP Electronically Signed   By: Aletta Edouard M.D.   On: 10/12/2021 16:46     ASSESSMENT & PLAN:   81 y.o. male with  1. 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 02/28/18 surgical pathology report with no overt pathology, could not rule out MDS, and noted an increase in ring sideroblasts 02/28/18 BM cytogenetics report was unremarkable  2. 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 Hepatocellular Carcinoma -- likely related to liver cirrhosis -02/14/2020 CT Abd/Pel ([id number]) revealed "1. 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 2. 4 cm hypervascular mass in segment 4A of left hepatic lobe, and 8 mm hypervascular nodule in posterior right hepatic lobe. These are highly suspicious for hepatocellular carcinomas in the setting of cirrhosis. 3. No evidence of metastatic  disease." -03/27/2020 MRI Abdomen ([id number]) revealed "1. The dominant lesion of concern in the medial segment of the left hepatic lobe (segment 4 A) is diagnostic of hepatocellular carcinoma (LR 5). 2. The other small lesion questioned inferiorly in the right lobe is not clearly seen, although evaluation of this area is mildly limited by motion artifact."  Patient is status post SBRT to 2 HCC lesions on the liver PLAN: -Lab work from 10/13/2020 discussed in details with the patient -Follow-up with gastroenterology for evaluation and management of his liver cirrhosis and splenomegaly as well as diuretic therapy. -No lab or clinical evidence of Hepatocellular Carcinoma progression at this time. MRI abd in 4 weeks Phone visit with Dr Irene Limbo in 5 weeks  FOLLOW UP: MRI abd in 4 weeks Phone visit with Dr Irene Limbo in 5 weeks  All of the patient's questions were answered with apparent satisfaction. The patient knows to call the clinic with any problems, questions or concerns.  Brunetta Genera MD ..

## 2021-10-21 ENCOUNTER — Telehealth: Payer: Self-pay | Admitting: Cardiology

## 2021-10-21 MED ORDER — ENTRESTO 24-26 MG PO TABS: 1.0000 | ORAL_TABLET | Freq: Two times a day (BID) | ORAL | 3 refills | Status: DC

## 2021-10-21 NOTE — Telephone Encounter (Signed)
Daughter [name] advised that no DPR on file giving [place] permission to speak with her and that she needed to have the patient or wife call to discuss. Verbalized understanding

## 2021-10-21 NOTE — Telephone Encounter (Signed)
Patient calling the office for samples of medication:   1.  What medication and dosage are you requesting samples for? sacubitril-valsartan (ENTRESTO) 24-26 MG  2.  Are you currently out of this medication? No  Patient would like some samples to last until he gets his rx in the mail. Please call patient's Daughter [name] to follow up

## 2021-10-21 NOTE — Telephone Encounter (Signed)
°*  STAT* If patient is at the pharmacy, call can be transferred to refill team.   1. Which medications need to be refilled? (please list name of each medication and dose if known)  sacubitril-valsartan (ENTRESTO) 24-26 MG  2. Which pharmacy/location (including street and city if local pharmacy) is medication to be sent to? [facility], [place]  3. Do they need a 30 day or 90 day supply? 36 with refills   Patient's daughter said it is more affordable for him to get this rx from the mail order than to pick it up from his local pharmacy

## 2021-10-21 NOTE — Telephone Encounter (Signed)
Samples not available at this time

## 2021-10-22 ENCOUNTER — Other Ambulatory Visit: Payer: Self-pay

## 2021-10-22 ENCOUNTER — Encounter: Payer: Self-pay | Admitting: Nurse Practitioner

## 2021-10-22 ENCOUNTER — Other Ambulatory Visit (INDEPENDENT_AMBULATORY_CARE_PROVIDER_SITE_OTHER): Payer: Medicare HMO

## 2021-10-22 ENCOUNTER — Telehealth: Payer: Self-pay | Admitting: Cardiology

## 2021-10-22 ENCOUNTER — Ambulatory Visit: Payer: Medicare HMO | Admitting: Nurse Practitioner

## 2021-10-22 VITALS — BP 102/64 | HR 112 | Ht 71.0 in | Wt 183.0 lb

## 2021-10-22 DIAGNOSIS — D649 Anemia, unspecified: Secondary | ICD-10-CM | POA: Diagnosis not present

## 2021-10-22 DIAGNOSIS — E875 Hyperkalemia: Secondary | ICD-10-CM

## 2021-10-22 DIAGNOSIS — K7031 Alcoholic cirrhosis of liver with ascites: Secondary | ICD-10-CM | POA: Diagnosis not present

## 2021-10-22 LAB — CBC WITH DIFFERENTIAL/PLATELET
Basophils Absolute: 0.1 10*3/uL (ref 0.0–0.1)
Basophils Relative: 0.8 % (ref 0.0–3.0)
Eosinophils Absolute: 0.1 10*3/uL (ref 0.0–0.7)
Eosinophils Relative: 0.9 % (ref 0.0–5.0)
HCT: 33 % — ABNORMAL LOW (ref 39.0–52.0)
Hemoglobin: 11.1 g/dL — ABNORMAL LOW (ref 13.0–17.0)
Lymphocytes Relative: 18.8 % (ref 12.0–46.0)
Lymphs Abs: 1.3 10*3/uL (ref 0.7–4.0)
MCHC: 33.5 g/dL (ref 30.0–36.0)
MCV: 102 fl — ABNORMAL HIGH (ref 78.0–100.0)
Monocytes Absolute: 0.5 10*3/uL (ref 0.1–1.0)
Monocytes Relative: 7.9 % (ref 3.0–12.0)
Neutro Abs: 4.9 10*3/uL (ref 1.4–7.7)
Neutrophils Relative %: 71.6 % (ref 43.0–77.0)
Platelets: 139 10*3/uL — ABNORMAL LOW (ref 150.0–400.0)
RBC: 3.24 Mil/uL — ABNORMAL LOW (ref 4.22–5.81)
RDW: 17.9 % — ABNORMAL HIGH (ref 11.5–15.5)
WBC: 6.9 10*3/uL (ref 4.0–10.5)

## 2021-10-22 LAB — BASIC METABOLIC PANEL
BUN: 21 mg/dL (ref 6–23)
CO2: 29 mEq/L (ref 19–32)
Calcium: 8.8 mg/dL (ref 8.4–10.5)
Chloride: 104 mEq/L (ref 96–112)
Creatinine, Ser: 1.13 mg/dL (ref 0.40–1.50)
GFR: 61.29 mL/min (ref >60.00)
Glucose, Bld: 125 mg/dL — ABNORMAL HIGH (ref 70–99)
Potassium: 4.5 mEq/L (ref 3.5–5.1)
Sodium: 140 mEq/L (ref 135–145)

## 2021-10-22 NOTE — Telephone Encounter (Signed)
I spoke with patient.He states he has no complaints of CP now and that has been his" whole life"    I encouraged him to got to the ED this weekend for evaluation if he develops CP or tachycardia again.     He agrees to seek treatment in the ED if CP returns.

## 2021-10-22 NOTE — Progress Notes (Signed)
10/22/2021 [name] [id number] [birth year]   Chief Complaint: Cirrhosis follow-up.  History of Present Illness: [name]. [name] is an 81 year old male with a past medical history of CHF, diabetes mellitus type 2, anemia, thrombocytopenia, cirrhosis and HCC s/p SBRT.  Presents her office today for cirrhosis follow-up.  His wife is present.  He underwent a paracentesis 08/30/2021, 09/22/2021 and his most recent paracentesis was 10/12/2021, 6.8 L of clear yellow fluid was removed. No SBP. He remains on Furosemide 82m QD and Spironolactone 541mQS was restarted after hyperkalemia was corrected (after Entresto dose was adjusted by his PCP and cardiologist).  His abdomen is beginning to swell but is not tense at this time.  No significant shortness of breath.  He does get full easily after eating small portions.  No nausea or vomiting.  He previously reported having diarrhea therefore GI pathogen panel was done and the results were negative.  However, he further clarified he was not passing watery stool but small pellet-like stools 10 times daily.  His appetite has diminished.  His heart rate is 118 beats/minutes in office at this time.  He denies having any palpitations but reported having left chest pain last night for about 10 minutes which resolved after he used oxygen 2 L nasal cannula.  I contacted his cardiology office during his appointment time and relayed my concern regarding the patient's heart rate an episode of chest pain last evening.  His office staff stated they would contact the patient for follow-up.  No other complaints at this time.  Labs 10/13/2021: WBC 3.7.  Hemoglobin 9.2.  Platelets 62.  Sodium 141.  Potassium 4.2.  Glucose 111.  Class I 45.  AST 23.  ALT 7.  INR 1.1 on 10/08/2021.  Current Outpatient Medications on File Prior to Visit  Medication Sig Dispense Refill   albuterol (VENTOLIN HFA) 108 (90 Base) MCG/ACT inhaler Inhale 2 puffs into the lungs every 6 (six) hours as  needed for wheezing or shortness of breath. 6.7 g 3   Alcohol Swabs (DROPSAFE ALCOHOL PREP) 70 % PADS USE  UP  TO FOUR TIMES DAILY AS DIRECTED 100 each 3   aspirin EC 81 MG tablet Take 81 mg by mouth daily.     blood glucose meter kit and supplies KIT Dispense based on patient and insurance preference. Use up to four times daily as directed. 1 each 0   Blood Glucose Monitoring Suppl (TRUE METRIX AIR GLUCOSE METER) w/Device KIT Use to check blood sugars daily. Dx: E11.9 1 kit 3   busPIRone (BUSPAR) 7.5 MG tablet Take 1 tablet (7.5 mg total) by mouth 2 (two) times daily as needed. for anxiety (Patient taking differently: Take 7.5 mg by mouth at bedtime.) 180 tablet 3   carvedilol (COREG) 6.25 MG tablet Take 1 tablet (6.25 mg total) by mouth 2 (two) times daily with a meal. 180 tablet 1   escitalopram (LEXAPRO) 5 MG tablet Take 1 tablet (5 mg total) by mouth daily. 90 tablet 3   famotidine (PEPCID) 20 MG tablet Take 20 mg by mouth 2 (two) times daily.     finasteride (PROSCAR) 5 MG tablet TAKE 1 TABLET EVERY DAY 90 tablet 1   furosemide (LASIX) 20 MG tablet Take 2 tablets (40 mg total) by mouth daily. 90 tablet 1   gabapentin (NEURONTIN) 300 MG capsule TAKE 1 CAPSULE TWICE DAILY 180 capsule 3   glucose blood (TRUE METRIX BLOOD GLUCOSE TEST) test strip TEST BLOOD  SUGAR  UP  TO FOUR TIMES DAILY AS DIRECTED 100 strip 1   loperamide (IMODIUM) 2 MG capsule Take 1 capsule (2 mg total) by mouth 3 (three) times daily between meals as needed for diarrhea or loose stools. 30 capsule 0   metFORMIN (GLUCOPHAGE) 500 MG tablet Take 500 mg by mouth 3 (three) times daily.     Multiple Vitamin (MULTIVITAMIN WITH MINERALS) TABS tablet Take 1 tablet by mouth daily. 30 tablet 0   Polyvinyl Alcohol-Povidone (REFRESH OP) Place 1 drop into both eyes at bedtime.     rosuvastatin (CRESTOR) 10 MG tablet TAKE 1 TABLET EVERY DAY 90 tablet 3   sacubitril-valsartan (ENTRESTO) 24-26 MG Take 1 tablet by mouth 2 (two) times daily.  180 tablet 3   spironolactone (ALDACTONE) 50 MG tablet Take 1 tablet (50 mg total) by mouth daily. 30 tablet 1   tamsulosin (FLOMAX) 0.4 MG CAPS capsule TAKE 1 CAPSULE EVERY DAY 90 capsule 1   TRUEplus Lancets 28G MISC TEST BLOOD SUGAR  UP  TO FOUR TIMES DAILY AS DIRECTED 100 each 3   Current Facility-Administered Medications on File Prior to Visit  Medication Dose Route Frequency Provider Last Rate Last Admin   albumin human 25 % solution 50 g  50 g Intravenous Once Noralyn Pick, NP       No Known Allergies  Current Medications, Allergies, Past Medical History, Past Surgical History, Family History and Social History were reviewed in [employer] record.   Review of Systems:   Constitutional: + Fatigue. Negative for fever, sweats, chills or weight loss.  Respiratory: No significant shortness of breath. Cardiovascular: See HPI. Gastrointestinal: See HPI.  Musculoskeletal: Negative for back pain or muscle aches.  Neurological: Negative for dizziness, headaches or paresthesias.   Physical Exam: BP 102/64    Pulse (!) 118    Ht '5\' 11"'  (1.803 m)    Wt 183 lb (83 kg)    SpO2 99%    BMI 25.52 kg/m  General: 81 year old male fatigued appearing in no acute distress. Head: Normocephalic and atraumatic. Eyes: No scleral icterus. Conjunctiva pink . Ears: Normal auditory acuity. Mouth: Dentition intact. No ulcers or lesions.  Lungs: Clear throughout to auscultation. Heart: Tachycardic, no murmur. Abdomen: Soft, nontender.  Distended with ascites but his abdomen is not tense.  No masses or hepatomegaly. Normal bowel sounds x 4 quadrants.  Rectal: Deferred. Musculoskeletal: Symmetrical with no gross deformities. Extremities: Mild bilateral lower extremity edema Neurological: Alert oriented x 4. No focal deficits.  Psychological: Alert and cooperative. Normal mood and affect  Assessment and Recommendations:  39) 69) 81 year old male with decompensated  cirrhosis with ascites, portal hypertension, mild portal hypertensive gastropathy, grade I esophageal varices and HCC s/p SBRT. S/P paracentesis 08/30/2021 5L of peritoneal fluid removed. No SBP.  No overt hepatic encephalopathy.  -CBC and CMP -Continue Furosemide 20 mg daily and Spironolactone 50 mg daily, if his labs are stable will increase doses of both diuretics. -Therapeutic paracentesis as needed, will likely need another paracentesis in 1 or 2 weeks -Continue 2gm low sodium diet    2) Stable chronic macrocytic anemia, secondary to cirrhosis/HCC, infrequent rectal bleeding. Hg 9.8. HCT 29.7. Prior B12 deficiency. Bone marrow biopsy 02/2018 showed hypercellular bone marow for age with trilineage hematopoiesis.  -CBC as ordered above  -Continue follow up with heme/oncologist Dr. Irene Limbo   3) Thrombocytopenia. PLT 62. CTAP 07/2021 without splenomegaly.  -Continue follow up with Dr. Irene Limbo    4) Gastritis/atypical gastric lesion  per EGD 07/13/2021 -Follow-up with Dr. Hilarie Fredrickson 11/2021 as previously discussed to consider eventual follow-up EGD   5) CHF followed by cardiology. LV EF 50 to 55% per ECHO 06/2020. Repeat ECHO scheduled 10/06/2021.   6) Tachycardia, left chest pain last night for 10 minutes abated after he wore oxygen 2 L nasal cannula -Follow-up with cardiology, see HPI -Patient instructed to go to the emergency room if he develops chest pain, palpitations, dizziness or shortness of breath

## 2021-10-22 NOTE — Telephone Encounter (Signed)
Pt c/o of Chest Pain: STAT if CP now or developed within 24 hours  1. Are you having CP right now? no  2. Are you experiencing any other symptoms (ex. SOB, nausea, vomiting, sweating)? Fast HR  3. How long have you been experiencing CP? States he has had it all his life, had some last night  4. Is your CP continuous or coming and going? Comes and goes  5. Have you taken Nitroglycerin? no   Carl Best GI NP at [facility] calling to inform the patient had some chest pain last night. Patient states he has had chest pain all his life. She states today his HR was also fast at 118. She says a call back can go to the patient. ?

## 2021-10-22 NOTE — Patient Instructions (Addendum)
If you are age 81 or older, your body mass index should be between 23-30. Your Body mass index is 25.52 kg/m. If this is out of the aforementioned range listed, please consider follow up with your Primary Care Provider.  The [facility] providers would like to encourage you to use [facility] to communicate with providers for non-urgent requests or questions.  Due to long hold times on the telephone, sending your provider a message by [facility] may be faster and more efficient way to get a response. Please allow 48 business hours for a response.  Please remember that this is for non-urgent requests/questions.  RECOMMENDATIONS: Continue Furosemide 40 MG once a day. Continue Spironolactone 50 MG once a day. Follow up with your Cardiologist. Miralax- Dissolve one capful in 8 ounces of water and drink before bed. Benefiber- 1 tablespoon daily.  Go to the Emergency Room if you develop chest pain, palpitations or shortness of breath.  It was great seeing you today! Thank you for entrusting me with your care and choosing [facility].  Noralyn Pick, CRNP

## 2021-10-23 NOTE — Addendum Note (Signed)
Addended by: Sullivan Lone on: 10/23/2021 11:51 PM   Modules accepted: Orders

## 2021-10-25 ENCOUNTER — Other Ambulatory Visit: Payer: Self-pay | Admitting: Family Medicine

## 2021-10-25 NOTE — Progress Notes (Signed)
Phone [phone] In person visit   Subjective:   [name] is a 81 y.o. year old very pleasant male patient who presents for/with See problem oriented charting Chief Complaint  Patient presents with   Follow-up    Persistent Cough    This visit occurred during the SARS-CoV-2 public health emergency.  Safety protocols were in place, including screening questions prior to the visit, additional usage of staff PPE, and extensive cleaning of exam room while observing appropriate contact time as indicated for disinfecting solutions.   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 Off narcotics 06/05/17 due to negative UDS for opiates x2. Full history 06/12/14. Pain contract signed.  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[place]) 04/06/2007    Priority: High   Anxiety 02/25/2020    Priority: Medium    B12 deficiency 08/20/2015    Priority: Medium    Diabetic polyneuropathy associated with type 2 diabetes mellitus ([place]) 08/07/2015    Priority: Medium    CKD (chronic kidney disease), stage III ([place]) 05/06/2015    Priority: Medium    Fatty liver 11/04/2014    Priority: Medium    BPH (benign prostatic hyperplasia) 06/20/2007    Priority: Medium    Depression 05/02/2007    Priority: Medium    Hyperlipidemia associated with type 2 diabetes mellitus ([place]) 04/06/2007    Priority: Medium    Essential hypertension  04/06/2007    Priority: Medium    Osteoarthritis 04/06/2007    Priority: Medium    Iron deficiency anemia due to chronic blood loss 04/19/2021    Priority: Low   Aortic atherosclerosis ([place]) 12/14/2019    Priority: Low   GERD (gastroesophageal reflux disease) 09/30/2017    Priority: Low   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 positive 03/29/2016    Priority: Low   Diplopia 06/05/2015    Priority: Low   Candidal balanoposthitis 06/20/2014    Priority: Low   PCO (posterior capsular opacification) 06/19/2013    Priority: Low   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    Priority: Low   Pseudophakia of left eye 04/10/2013    Priority: Low   Nuclear cataract 01/20/2012    Priority: Low   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    Priority: Low   Malnutrition of moderate degree 07/14/2021   Cirrhosis of liver without ascites (HCC)    Mucosal abnormality of stomach    Portal hypertensive gastropathy (HCC)    Secondary esophageal varices without bleeding (HCC)    Generalized weakness 07/13/2021   Chronic diarrhea 07/13/2021   Diarrhea due to malabsorption 07/13/2021   Anemia 03/05/2018    Medications- reviewed and updated Current Outpatient Medications  Medication Sig Dispense Refill   oxyCODONE (ROXICODONE) 5 MG immediate release tablet Take 1 tablet (5 mg total) by mouth every 8 (eight) hours. Only take if family is close 15 tablet 0  albuterol (VENTOLIN HFA) 108 (90 Base) MCG/ACT inhaler Inhale 2 puffs into the lungs every 6 (six) hours as needed for wheezing or shortness of breath. 18 g 3   Alcohol Swabs (DROPSAFE ALCOHOL PREP) 70 % PADS USE  UP  TO FOUR TIMES DAILY AS DIRECTED 400 each 3   aspirin EC 81 MG tablet Take 81 mg by mouth daily.     Blood Glucose Monitoring Suppl (TRUE METRIX AIR GLUCOSE METER) w/Device KIT Use to check blood sugars daily. Dx: E11.9 1 kit 3   busPIRone (BUSPAR) 7.5 MG tablet Take 1 tablet (7.5 mg total) by mouth 2 (two) times daily as needed. for anxiety  (Patient taking differently: Take 7.5 mg by mouth at bedtime.) 180 tablet 3   carvedilol (COREG) 6.25 MG tablet Take 1 tablet (6.25 mg total) by mouth 2 (two) times daily with a meal. 180 tablet 1   escitalopram (LEXAPRO) 5 MG tablet Take 1 tablet (5 mg total) by mouth daily. 90 tablet 3   famotidine (PEPCID) 20 MG tablet Take 20 mg by mouth 2 (two) times daily.     finasteride (PROSCAR) 5 MG tablet TAKE 1 TABLET EVERY DAY 90 tablet 1   furosemide (LASIX) 20 MG tablet Take 2 tablets (40 mg total) by mouth daily. 90 tablet 1   gabapentin (NEURONTIN) 300 MG capsule TAKE 1 CAPSULE TWICE DAILY 180 capsule 3   loperamide (IMODIUM) 2 MG capsule Take 1 capsule (2 mg total) by mouth 3 (three) times daily between meals as needed for diarrhea or loose stools. 30 capsule 0   Multiple Vitamin (MULTIVITAMIN WITH MINERALS) TABS tablet Take 1 tablet by mouth daily. 30 tablet 0   Polyvinyl Alcohol-Povidone (REFRESH OP) Place 1 drop into both eyes at bedtime.     rosuvastatin (CRESTOR) 10 MG tablet TAKE 1 TABLET EVERY DAY 90 tablet 3   sacubitril-valsartan (ENTRESTO) 24-26 MG Take 1 tablet by mouth 2 (two) times daily. 180 tablet 3   spironolactone (ALDACTONE) 50 MG tablet Take 1.5 tablets (75 mg total) by mouth daily. 30 tablet 1   tamsulosin (FLOMAX) 0.4 MG CAPS capsule TAKE 1 CAPSULE EVERY DAY 90 capsule 1   TRUE METRIX BLOOD GLUCOSE TEST test strip TEST BLOOD SUGAR  UP  TO FOUR TIMES DAILY AS DIRECTED 300 strip 3   TRUEplus Lancets 28G MISC TEST BLOOD SUGAR  UP  TO FOUR TIMES DAILY AS DIRECTED 100 each 3   No current facility-administered medications for this visit.     Objective:  BP 102/60 (BP Location: Left Arm, Patient Position: Sitting, Cuff Size: Large)    Pulse (!) 107    Temp 98.7 F (37.1 C) (Temporal)    Ht '5\' 11"'  (1.803 m)    Wt 189 lb (85.7 kg)    SpO2 93%    BMI 26.36 kg/m  Gen: NAD, resting comfortably Denies sinus pressure CV: Heart rate was regular on my exam But high normal Lungs: CTAB  no crackles, wheeze, rhonchi Abdomen: soft/nontender/nondistended/normal bowel sounds. No rebound or guarding.  Ext: Trace edema      Assessment and Plan    #Persistent cough S: On 07/15/2021 patient Had chest x-ray done in hospitaland found to have "Patchy opacities in the left lower lobe suspicious for pneumonia in the correct clinical setting. Recommend follow-up radiographs in 6-8 weeks to assess for resolution."  Patient completed short course of antibiotics at that time-original plan was to repeat chest x-ray in about 6-week timeframe-we did not set this up  at last visit as intended.  Started with cough 3-4 days ago mainly when he starts talking. Has done 3 covid tests at home and all negative. No shortness of breath more than baseline- has planned paracentesis tomorrow. No sore throat or runny nose. No headaches. Does not cough anything up  One does of albuterol did help the other night. No sinus congestion- did not recently swallow anything awkwardly A/P: 81 year old male with unresectable due to comorbidities hepatocellular carcinoma, cirrhosis with persistent ascites, COPD, interstitial lung disease with prior pneumonia that was treated with antibiotics and patient improved and then recently patient with worsening cough over the last 3 to 4 days primarily when he speaks-he is due for updated chest x-ray we ordered at this time.  - Suspect he may have pneumonia and may need antibiotic-oxygen running slightly lower than normal for himUsually around 95% -Also will use albuterol as needed-refilled today - He has had some worsening back pain with this-prior tramadol was not helpful and we opted to use short-term course of oxycodone to help him with quality of life-depending on how he does with this/debility would consider for chronic pain management again (opioids not on urine x2 in the past when he was taking medication chronically and we stopped but he is in a far different life situation  now)  -he has Paracentesis planned tomorrow-we are hoping to get x-[facility] done at [facility] as well Paracentesis planned tomorrow-we are hoping to get x-ray done at [facility] as well  # Diabetes S: Medication: off all meds- related dto weight loss  Lab Results  Component Value Date   HGBA1C 5.2 07/13/2021   HGBA1C 6.5 (A) 12/15/2020   HGBA1C 5.9 (A) 02/25/2020    A/P: Well-controlled despite stopping metformin-likely recheck at next visit   #ILD/COPD S: Patient carried a diagnosis of COPD due to former smoking. Also previously diagnosed as having interstitial lung disease by pulmonology. He does not follow-up with them anymore. Fortunately breathing issues had not progressed.  He has albuterol but uses it 1-3x a day in past- not using recently A/P:Patient previously used albuterol several times a day-we discussed restarting use of this to see if it helps him with his shortness of breath though some of this could be coming from ascites and heart failure.-He agrees to trial this but wants to hold off on controller such as Trelegy or similar   #/Congestive heart failure with EF 20 to 25%/hypertension/CKD stage III S: Post heart failure hospitalization patient compliant with carvedilol 6.25 mg BID, Lasix 40 mg daily, Entresto 24-26 mg BID started by cardiology- Also now on spironolactone through GI. Prior to heart failure: amlodipine 9m, lisinopril 2.561m Chronic kidney disease has been stable with GFR in the 50s-or slightly higher A/P: CHF appears largely stable-Continue current medication  #hyperlipidemia/aortic atherosclerosis S: compliant with rosuvastatin 1054mvery day. LDL goal under 70 Lab Results  Component Value Date   CHOL 120 11/07/2019   HDL 45.60 11/07/2019   LDLCALC 49 11/07/2019   LDLDIRECT 57.0 10/22/2018   TRIG 126.0 11/07/2019   CHOLHDL 3 11/07/2019   A/P: Has been well controlled-update lipid panel with next labs but honestly with his overall healthAlso  considering stopping medication though technically with aortic atherosclerosis LDL goal should be under 70  # history of ocular herpes S:patient was previously compliant with acyclovir 400m87mD. Had been on this since 1980- apparently had flare ups in past when tried to come off. Was in his right  eye.  Appears this was stopped in the hospital October 2022 A/P: We discussed restarting medication but patient declines-he prefers to reduce overall medication burden especially since he has not noticed recurrent issues   #Chronic low back pain/diabetic neuropathy S: Used to be on narcotics but UDS's were negative for narcotics x2 and these were discontinued. Patient is- compliant withgabapentin-he founded this helpful. Recently off amitriptyline -tramadol did not help at all. Taking tylenol 3 per day 500 mg in AM and PM- 3000 mg- would max out at 2000 units -declines palliative consult for overall health A/P: With chronic low back pain and painful diabetic neuropathy we opted to try oxycodone short-term-especially as we transition more more towards palliative care may make sense to focus more on pain control-I do not strongly suspect diversion but I explained to patient why we had to stop medication previously    Health Maintenance Due  Topic Date Due   TETANUS/TDAP  06/10/2016   OPHTHALMOLOGY EXAM  01/25/2019   Recommended follow up: Return in about 3 months (around 01/30/2022) for follow up- or sooner if needed. Future Appointments  Date Time Provider Force  11/02/2021  1:00 PM [facility] 3 [facility]  11/04/2021  2:15 PM Felipa Furnace, DPM [facility]  11/10/2021 10:10 AM Pyrtle, Lajuan Lines, MD [facility]  11/10/2021  3:20 PM Satira Sark, MD [facility]  11/17/2021  3:20 PM Brunetta Genera, MD [facility] None  12/21/2021  3:30 PM Marzetta Board, DPM [facility]  12/21/2021  4:00 PM Little, Aaron Edelman T [facility]  02/04/2022  1:40 PM  [name], MD [facility]  09/23/2022  2:30 PM [facility] HEALTH COACH [facility]   Lab/Order associations:   ICD-10-CM   1. Essential hypertension  I10     2. Hepatocellular carcinoma ([place])  C22.0     3. Controlled type 2 diabetes mellitus with diabetic polyneuropathy, without long-term current use of insulin (HCC)  E11.42     4. Congestive heart failure, unspecified HF chronicity, unspecified heart failure type ([place])  I50.9     5. Chronic obstructive pulmonary disease, unspecified COPD type (HCC)  J44.9 albuterol (VENTOLIN HFA) 108 (90 Base) MCG/ACT inhaler    6. ILD (interstitial lung disease) ([place])  J84.9     7. Persistent cough  R05.3 DG Chest 2 View    CANCELED: DG Chest 2 View    8. Hyperlipidemia, unspecified hyperlipidemia type  E78.5     9. Aortic atherosclerosis (HCC) Chronic I70.0       Meds ordered this encounter  Medications   albuterol (VENTOLIN HFA) 108 (90 Base) MCG/ACT inhaler    Sig: Inhale 2 puffs into the lungs every 6 (six) hours as needed for wheezing or shortness of breath.    Dispense:  18 g    Refill:  3   oxyCODONE (ROXICODONE) 5 MG immediate release tablet    Sig: Take 1 tablet (5 mg total) by mouth every 8 (eight) hours. Only take if family is close    Dispense:  15 tablet    Refill:  0    Time Spent: 48 minutes of total time (4:50 PM- 5:38 PM) was spent on the date of the encounter performing the following actions: chart review prior to seeing the patient, obtaining history, performing a medically necessary exam, counseling on the treatment plan, placing orders, and documenting in our EHR.    I,Jada Bradford,acting as a scribe for [name], MD.,have documented all relevant documentation on the behalf  of [name], MD,as directed by  [name], MD while in the presence of [name], MD.  I, [name], MD, have reviewed all documentation for this visit. The documentation on 11/01/21 for the exam, diagnosis,  procedures, and orders are all accurate and complete.   Return precautions advised.  [name], MD

## 2021-10-26 ENCOUNTER — Other Ambulatory Visit: Payer: Self-pay

## 2021-10-26 MED ORDER — SPIRONOLACTONE 50 MG PO TABS: 75.0000 mg | ORAL_TABLET | Freq: Every day | ORAL | 1 refills | Status: DC

## 2021-10-27 ENCOUNTER — Other Ambulatory Visit: Payer: Self-pay | Admitting: Family Medicine

## 2021-10-27 ENCOUNTER — Other Ambulatory Visit: Payer: Self-pay

## 2021-10-27 DIAGNOSIS — K7031 Alcoholic cirrhosis of liver with ascites: Secondary | ICD-10-CM

## 2021-11-01 ENCOUNTER — Other Ambulatory Visit: Payer: Self-pay

## 2021-11-01 ENCOUNTER — Encounter: Payer: Self-pay | Admitting: Family Medicine

## 2021-11-01 ENCOUNTER — Ambulatory Visit (INDEPENDENT_AMBULATORY_CARE_PROVIDER_SITE_OTHER): Payer: Medicare HMO | Admitting: Family Medicine

## 2021-11-01 VITALS — BP 102/60 | HR 107 | Temp 98.7°F | Ht 71.0 in | Wt 189.0 lb

## 2021-11-01 DIAGNOSIS — J849 Interstitial pulmonary disease, unspecified: Secondary | ICD-10-CM | POA: Diagnosis not present

## 2021-11-01 DIAGNOSIS — C220 Liver cell carcinoma: Secondary | ICD-10-CM

## 2021-11-01 DIAGNOSIS — J449 Chronic obstructive pulmonary disease, unspecified: Secondary | ICD-10-CM

## 2021-11-01 DIAGNOSIS — K219 Gastro-esophageal reflux disease without esophagitis: Secondary | ICD-10-CM

## 2021-11-01 DIAGNOSIS — I700 Atherosclerosis of aorta: Secondary | ICD-10-CM | POA: Diagnosis not present

## 2021-11-01 DIAGNOSIS — R053 Chronic cough: Secondary | ICD-10-CM

## 2021-11-01 DIAGNOSIS — I509 Heart failure, unspecified: Secondary | ICD-10-CM | POA: Diagnosis not present

## 2021-11-01 DIAGNOSIS — E1142 Type 2 diabetes mellitus with diabetic polyneuropathy: Secondary | ICD-10-CM

## 2021-11-01 DIAGNOSIS — E785 Hyperlipidemia, unspecified: Secondary | ICD-10-CM

## 2021-11-01 DIAGNOSIS — I10 Essential (primary) hypertension: Secondary | ICD-10-CM | POA: Diagnosis not present

## 2021-11-01 MED ORDER — OXYCODONE HCL 5 MG PO TABS: 5.0000 mg | ORAL_TABLET | Freq: Three times a day (TID) | ORAL | 0 refills | Status: DC

## 2021-11-01 MED ORDER — ALBUTEROL SULFATE HFA 108 (90 BASE) MCG/ACT IN AERS: 2.0000 | INHALATION_SPRAY | Freq: Four times a day (QID) | RESPIRATORY_TRACT | 3 refills | Status: DC | PRN

## 2021-11-01 NOTE — Progress Notes (Signed)
Addendum: Reviewed and agree with assessment and management plan. Keylen Eckenrode M, MD  

## 2021-11-01 NOTE — Patient Instructions (Addendum)
Oxycodone 5 mg trial- take just a half tablet to start and make sure someone is with you and that you remain steady. We are transitioning to a more palliative approach for your care- focus on comfort as we cannot cure the multiple things that are fighting against you   Please check with [place] tomorrow to see if they can do x-ray as well- if not I can change to [place] or other location  Try albuterol inhaler to see if that helps you at all  Recommended follow up: Return in about 3 months (around 01/30/2022) for follow up- or sooner if needed.

## 2021-11-02 ENCOUNTER — Telehealth: Payer: Self-pay | Admitting: Nurse Practitioner

## 2021-11-02 ENCOUNTER — Ambulatory Visit (HOSPITAL_COMMUNITY)
Admission: RE | Admit: 2021-11-02 | Discharge: 2021-11-02 | Disposition: A | Discharge status: Home / Self Care | Payer: Medicare HMO | Source: Ambulatory Visit | Attending: Nurse Practitioner | Admitting: Nurse Practitioner

## 2021-11-02 ENCOUNTER — Other Ambulatory Visit: Payer: Self-pay

## 2021-11-02 ENCOUNTER — Inpatient Hospital Stay (HOSPITAL_COMMUNITY)
Admission: EM | Admit: 2021-11-02 | Discharge: 2021-11-07 | DRG: 871 | Disposition: A | Discharge status: Home / Self Care | Payer: Medicare HMO | Attending: Internal Medicine | Admitting: Internal Medicine

## 2021-11-02 ENCOUNTER — Ambulatory Visit (HOSPITAL_COMMUNITY)
Admission: RE | Admit: 2021-11-02 | Discharge: 2021-11-02 | Disposition: A | Discharge status: Home / Self Care | Payer: Medicare HMO | Source: Ambulatory Visit | Attending: Family Medicine | Admitting: Family Medicine

## 2021-11-02 DIAGNOSIS — R188 Other ascites: Secondary | ICD-10-CM | POA: Diagnosis present

## 2021-11-02 DIAGNOSIS — R059 Cough, unspecified: Secondary | ICD-10-CM | POA: Diagnosis not present

## 2021-11-02 DIAGNOSIS — F32A Depression, unspecified: Secondary | ICD-10-CM | POA: Diagnosis present

## 2021-11-02 DIAGNOSIS — Z66 Do not resuscitate: Secondary | ICD-10-CM | POA: Diagnosis present

## 2021-11-02 DIAGNOSIS — I130 Hypertensive heart and chronic kidney disease with heart failure and stage 1 through stage 4 chronic kidney disease, or unspecified chronic kidney disease: Secondary | ICD-10-CM | POA: Diagnosis present

## 2021-11-02 DIAGNOSIS — R54 Age-related physical debility: Secondary | ICD-10-CM | POA: Diagnosis present

## 2021-11-02 DIAGNOSIS — B9620 Unspecified Escherichia coli [E. coli] as the cause of diseases classified elsewhere: Secondary | ICD-10-CM | POA: Diagnosis present

## 2021-11-02 DIAGNOSIS — K652 Spontaneous bacterial peritonitis: Secondary | ICD-10-CM | POA: Diagnosis present

## 2021-11-02 DIAGNOSIS — K7469 Other cirrhosis of liver: Secondary | ICD-10-CM | POA: Diagnosis present

## 2021-11-02 DIAGNOSIS — N390 Urinary tract infection, site not specified: Secondary | ICD-10-CM | POA: Diagnosis present

## 2021-11-02 DIAGNOSIS — K7460 Unspecified cirrhosis of liver: Secondary | ICD-10-CM | POA: Diagnosis present

## 2021-11-02 DIAGNOSIS — R053 Chronic cough: Secondary | ICD-10-CM | POA: Insufficient documentation

## 2021-11-02 DIAGNOSIS — K7200 Acute and subacute hepatic failure without coma: Secondary | ICD-10-CM | POA: Diagnosis present

## 2021-11-02 DIAGNOSIS — D696 Thrombocytopenia, unspecified: Secondary | ICD-10-CM | POA: Diagnosis present

## 2021-11-02 DIAGNOSIS — I10 Essential (primary) hypertension: Secondary | ICD-10-CM | POA: Diagnosis present

## 2021-11-02 DIAGNOSIS — D539 Nutritional anemia, unspecified: Secondary | ICD-10-CM | POA: Diagnosis present

## 2021-11-02 DIAGNOSIS — R296 Repeated falls: Secondary | ICD-10-CM | POA: Diagnosis present

## 2021-11-02 DIAGNOSIS — B965 Pseudomonas (aeruginosa) (mallei) (pseudomallei) as the cause of diseases classified elsewhere: Secondary | ICD-10-CM | POA: Diagnosis present

## 2021-11-02 DIAGNOSIS — A419 Sepsis, unspecified organism: Principal | ICD-10-CM

## 2021-11-02 DIAGNOSIS — I868 Varicose veins of other specified sites: Secondary | ICD-10-CM | POA: Diagnosis not present

## 2021-11-02 DIAGNOSIS — I5032 Chronic diastolic (congestive) heart failure: Secondary | ICD-10-CM | POA: Diagnosis present

## 2021-11-02 DIAGNOSIS — Z8505 Personal history of malignant neoplasm of liver: Secondary | ICD-10-CM | POA: Diagnosis not present

## 2021-11-02 DIAGNOSIS — E785 Hyperlipidemia, unspecified: Secondary | ICD-10-CM | POA: Diagnosis present

## 2021-11-02 DIAGNOSIS — N400 Enlarged prostate without lower urinary tract symptoms: Secondary | ICD-10-CM | POA: Diagnosis present

## 2021-11-02 DIAGNOSIS — F419 Anxiety disorder, unspecified: Secondary | ICD-10-CM | POA: Diagnosis present

## 2021-11-02 DIAGNOSIS — E1142 Type 2 diabetes mellitus with diabetic polyneuropathy: Secondary | ICD-10-CM | POA: Diagnosis not present

## 2021-11-02 DIAGNOSIS — Z20822 Contact with and (suspected) exposure to covid-19: Secondary | ICD-10-CM | POA: Diagnosis present

## 2021-11-02 DIAGNOSIS — C220 Liver cell carcinoma: Secondary | ICD-10-CM | POA: Diagnosis present

## 2021-11-02 DIAGNOSIS — K7031 Alcoholic cirrhosis of liver with ascites: Secondary | ICD-10-CM | POA: Diagnosis not present

## 2021-11-02 DIAGNOSIS — E1122 Type 2 diabetes mellitus with diabetic chronic kidney disease: Secondary | ICD-10-CM | POA: Diagnosis present

## 2021-11-02 DIAGNOSIS — Z7189 Other specified counseling: Secondary | ICD-10-CM | POA: Diagnosis not present

## 2021-11-02 DIAGNOSIS — E43 Unspecified severe protein-calorie malnutrition: Secondary | ICD-10-CM | POA: Diagnosis present

## 2021-11-02 DIAGNOSIS — K529 Noninfective gastroenteritis and colitis, unspecified: Secondary | ICD-10-CM | POA: Diagnosis present

## 2021-11-02 DIAGNOSIS — Z515 Encounter for palliative care: Secondary | ICD-10-CM

## 2021-11-02 DIAGNOSIS — N183 Chronic kidney disease, stage 3 unspecified: Secondary | ICD-10-CM | POA: Diagnosis present

## 2021-11-02 DIAGNOSIS — N401 Enlarged prostate with lower urinary tract symptoms: Secondary | ICD-10-CM | POA: Diagnosis not present

## 2021-11-02 DIAGNOSIS — Z7982 Long term (current) use of aspirin: Secondary | ICD-10-CM

## 2021-11-02 DIAGNOSIS — D500 Iron deficiency anemia secondary to blood loss (chronic): Secondary | ICD-10-CM | POA: Diagnosis present

## 2021-11-02 DIAGNOSIS — I81 Portal vein thrombosis: Secondary | ICD-10-CM | POA: Diagnosis not present

## 2021-11-02 DIAGNOSIS — J449 Chronic obstructive pulmonary disease, unspecified: Secondary | ICD-10-CM | POA: Diagnosis present

## 2021-11-02 DIAGNOSIS — Z6827 Body mass index (BMI) 27.0-27.9, adult: Secondary | ICD-10-CM

## 2021-11-02 DIAGNOSIS — E1149 Type 2 diabetes mellitus with other diabetic neurological complication: Secondary | ICD-10-CM | POA: Diagnosis present

## 2021-11-02 DIAGNOSIS — Z79899 Other long term (current) drug therapy: Secondary | ICD-10-CM

## 2021-11-02 HISTORY — PX: IR PARACENTESIS: IMG2679

## 2021-11-02 LAB — CBC WITH DIFFERENTIAL/PLATELET
Abs Immature Granulocytes: 0.01 10*3/uL (ref 0.00–0.07)
Basophils Absolute: 0 10*3/uL (ref 0.0–0.1)
Basophils Relative: 0 %
Eosinophils Absolute: 0 10*3/uL (ref 0.0–0.5)
Eosinophils Relative: 1 %
HCT: 27.6 % — ABNORMAL LOW (ref 39.0–52.0)
Hemoglobin: 8.8 g/dL — ABNORMAL LOW (ref 13.0–17.0)
Immature Granulocytes: 0 %
Lymphocytes Relative: 9 %
Lymphs Abs: 0.5 10*3/uL — ABNORMAL LOW (ref 0.7–4.0)
MCH: 34 pg (ref 26.0–34.0)
MCHC: 31.9 g/dL (ref 30.0–36.0)
MCV: 106.6 fL — ABNORMAL HIGH (ref 80.0–100.0)
Monocytes Absolute: 0.4 10*3/uL (ref 0.1–1.0)
Monocytes Relative: 8 %
Neutro Abs: 4.2 10*3/uL (ref 1.7–7.7)
Neutrophils Relative %: 82 %
Platelets: 146 10*3/uL — ABNORMAL LOW (ref 150–400)
RBC: 2.59 MIL/uL — ABNORMAL LOW (ref 4.22–5.81)
RDW: 16.4 % — ABNORMAL HIGH (ref 11.5–15.5)
WBC: 5.2 10*3/uL (ref 4.0–10.5)
nRBC: 0 % (ref 0.0–0.2)

## 2021-11-02 LAB — URINALYSIS, ROUTINE W REFLEX MICROSCOPIC
Bilirubin Urine: NEGATIVE
Glucose, UA: NEGATIVE mg/dL
Hgb urine dipstick: NEGATIVE
Ketones, ur: NEGATIVE mg/dL
Nitrite: NEGATIVE
Protein, ur: NEGATIVE mg/dL
Specific Gravity, Urine: 1.026 (ref 1.005–1.030)
pH: 5 (ref 5.0–8.0)

## 2021-11-02 LAB — BODY FLUID CELL COUNT WITH DIFFERENTIAL
Eos, Fluid: 0 %
Lymphs, Fluid: 9 %
Monocyte-Macrophage-Serous Fluid: 7 % — ABNORMAL LOW (ref 50–90)
Neutrophil Count, Fluid: 84 % — ABNORMAL HIGH (ref 0–25)
Total Nucleated Cell Count, Fluid: 1066 cu mm — ABNORMAL HIGH (ref 0–1000)

## 2021-11-02 LAB — COMPREHENSIVE METABOLIC PANEL
ALT: 25 U/L (ref 0–44)
AST: 47 U/L — ABNORMAL HIGH (ref 15–41)
Albumin: 2.7 g/dL — ABNORMAL LOW (ref 3.5–5.0)
Alkaline Phosphatase: 240 U/L — ABNORMAL HIGH (ref 38–126)
Anion gap: 8 (ref 5–15)
BUN: 22 mg/dL (ref 8–23)
CO2: 28 mmol/L (ref 22–32)
Calcium: 8.5 mg/dL — ABNORMAL LOW (ref 8.9–10.3)
Chloride: 101 mmol/L (ref 98–111)
Creatinine, Ser: 1.16 mg/dL (ref 0.61–1.24)
GFR, Estimated: >60 mL/min (ref >60)
Glucose, Bld: 194 mg/dL — ABNORMAL HIGH (ref 70–99)
Potassium: 4.1 mmol/L (ref 3.5–5.1)
Sodium: 137 mmol/L (ref 135–145)
Total Bilirubin: 0.9 mg/dL (ref 0.3–1.2)
Total Protein: 5.9 g/dL — ABNORMAL LOW (ref 6.5–8.1)

## 2021-11-02 LAB — LIPASE, BLOOD: Lipase: 55 U/L — ABNORMAL HIGH (ref 11–51)

## 2021-11-02 LAB — RESP PANEL BY RT-PCR (FLU A&B, COVID) ARPGX2
Influenza A by PCR: NEGATIVE
Influenza B by PCR: NEGATIVE
SARS Coronavirus 2 by RT PCR: NEGATIVE

## 2021-11-02 LAB — LACTIC ACID, PLASMA: Lactic Acid, Venous: 2.5 mmol/L (ref 0.5–1.9)

## 2021-11-02 MED ORDER — ALBUMIN HUMAN 25 % IV SOLN
INTRAVENOUS | Status: AC
  Filled 2021-11-02: qty 200

## 2021-11-02 MED ORDER — LIDOCAINE HCL (PF) 1 % IJ SOLN
INTRAMUSCULAR | Status: DC | PRN
  Administered 2021-11-02: 10 mL

## 2021-11-02 MED ORDER — ALBUMIN HUMAN 25 % IV SOLN
50.0000 g | Freq: Once | INTRAVENOUS | Status: DC
  Filled 2021-11-02: qty 200

## 2021-11-02 NOTE — ED Provider Triage Note (Signed)
Emergency Medicine Provider Triage Evaluation Note  [name] , a 81 y.o. male  was evaluated in triage.  Pt complains of abnormal lab. Per PCP "[name] is a 81 year old male with decompensated cirrhosis. I received his outpatient paracentesis results this evening. Patient underwent paracentesis today 6L peritoneal fluid was removed. He received Albumin 50 gm IV x 1 post paracentesis. Cell count showed PMN 895 consistent with SBP. No prior history of SBP. I called him at this time, he feels well, no abdominal pain or fever. I consulted with Dr. Rush Landmark on call physician who recommended sending [name] to the ED for observation, labs and IV antibiotics then oral antibiotic when appropriate. [name] stated he would go to [facility] ED tomorrow morning as he did not have a ride until then. He was advised to call 911 if he developed any fever or abdominal pain overnight."  Review of Systems  Positive: Abdominal pain Negative:   Physical Exam  BP 121/68 (BP Location: Right Arm)    Pulse (!) 109    Temp 98.6 F (37 C) (Oral)    Resp 20    Ht 5\' 11"  (1.803 m)    Wt 91 kg    SpO2 100%    BMI 27.98 kg/m  Gen:   Awake, no distress   Resp:  Normal effort  MSK:   Moves extremities without difficulty  Other:    Medical Decision Making  Medically screening exam initiated at 8:20 PM.  Appropriate orders placed.  [name] was informed that the remainder of the evaluation will be completed by another provider, this initial triage assessment does not replace that evaluation, and the importance of remaining in the ED until their evaluation is complete.  Labs, blood cultures, lactic acid COVID for admission   Karie Kirks 11/02/21 2026

## 2021-11-02 NOTE — Telephone Encounter (Signed)
[name] is a 81 year old male with decompensated cirrhosis. I received his outpatient paracentesis results this evening. Patient underwent paracentesis today 6L peritoneal fluid was removed. He received Albumin 50 gm IV x 1 post paracentesis. Cell count showed PMN 895 consistent with SBP. No prior history of SBP. I called him at this time, he feels well, no abdominal pain or fever. I consulted with Dr. Rush Landmark on call physician who recommended sending [name] to the ED for observation, labs and IV antibiotics then oral antibiotic when appropriate. [name] stated he would go to [facility] ED tomorrow morning as he did not have a ride until then. He was advised to call 911 if he developed any fever or abdominal pain overnight.

## 2021-11-02 NOTE — Procedures (Signed)
Ultrasound-guided diagnostic paracentesis performed yielding 6 liters of straw colored fluid.  No immediate complications. EBL is none.

## 2021-11-02 NOTE — ED Triage Notes (Signed)
Patient advised by his PCP to go to ER due to abnormal blood tests results taken yesterday " my blood is infected" , denies fever or chills .

## 2021-11-03 ENCOUNTER — Telehealth: Payer: Self-pay | Admitting: Nurse Practitioner

## 2021-11-03 ENCOUNTER — Emergency Department (HOSPITAL_COMMUNITY): Payer: Medicare HMO

## 2021-11-03 DIAGNOSIS — K7200 Acute and subacute hepatic failure without coma: Secondary | ICD-10-CM | POA: Diagnosis present

## 2021-11-03 DIAGNOSIS — F32A Depression, unspecified: Secondary | ICD-10-CM | POA: Diagnosis present

## 2021-11-03 DIAGNOSIS — K652 Spontaneous bacterial peritonitis: Secondary | ICD-10-CM | POA: Diagnosis not present

## 2021-11-03 DIAGNOSIS — R188 Other ascites: Secondary | ICD-10-CM | POA: Diagnosis present

## 2021-11-03 DIAGNOSIS — K7460 Unspecified cirrhosis of liver: Secondary | ICD-10-CM

## 2021-11-03 DIAGNOSIS — B965 Pseudomonas (aeruginosa) (mallei) (pseudomallei) as the cause of diseases classified elsewhere: Secondary | ICD-10-CM | POA: Diagnosis present

## 2021-11-03 DIAGNOSIS — K529 Noninfective gastroenteritis and colitis, unspecified: Secondary | ICD-10-CM | POA: Diagnosis not present

## 2021-11-03 DIAGNOSIS — N401 Enlarged prostate with lower urinary tract symptoms: Secondary | ICD-10-CM

## 2021-11-03 DIAGNOSIS — E43 Unspecified severe protein-calorie malnutrition: Secondary | ICD-10-CM | POA: Diagnosis present

## 2021-11-03 DIAGNOSIS — D539 Nutritional anemia, unspecified: Secondary | ICD-10-CM | POA: Diagnosis present

## 2021-11-03 DIAGNOSIS — C220 Liver cell carcinoma: Secondary | ICD-10-CM | POA: Diagnosis not present

## 2021-11-03 DIAGNOSIS — D696 Thrombocytopenia, unspecified: Secondary | ICD-10-CM

## 2021-11-03 DIAGNOSIS — I5032 Chronic diastolic (congestive) heart failure: Secondary | ICD-10-CM | POA: Diagnosis present

## 2021-11-03 DIAGNOSIS — N390 Urinary tract infection, site not specified: Secondary | ICD-10-CM | POA: Diagnosis present

## 2021-11-03 DIAGNOSIS — J449 Chronic obstructive pulmonary disease, unspecified: Secondary | ICD-10-CM

## 2021-11-03 DIAGNOSIS — B9620 Unspecified Escherichia coli [E. coli] as the cause of diseases classified elsewhere: Secondary | ICD-10-CM | POA: Diagnosis present

## 2021-11-03 DIAGNOSIS — A419 Sepsis, unspecified organism: Secondary | ICD-10-CM | POA: Diagnosis present

## 2021-11-03 DIAGNOSIS — I130 Hypertensive heart and chronic kidney disease with heart failure and stage 1 through stage 4 chronic kidney disease, or unspecified chronic kidney disease: Secondary | ICD-10-CM | POA: Diagnosis present

## 2021-11-03 DIAGNOSIS — Z515 Encounter for palliative care: Secondary | ICD-10-CM | POA: Diagnosis not present

## 2021-11-03 DIAGNOSIS — Z66 Do not resuscitate: Secondary | ICD-10-CM | POA: Diagnosis present

## 2021-11-03 DIAGNOSIS — F419 Anxiety disorder, unspecified: Secondary | ICD-10-CM | POA: Diagnosis not present

## 2021-11-03 DIAGNOSIS — K7031 Alcoholic cirrhosis of liver with ascites: Secondary | ICD-10-CM

## 2021-11-03 DIAGNOSIS — N183 Chronic kidney disease, stage 3 unspecified: Secondary | ICD-10-CM | POA: Diagnosis present

## 2021-11-03 DIAGNOSIS — R54 Age-related physical debility: Secondary | ICD-10-CM | POA: Diagnosis present

## 2021-11-03 DIAGNOSIS — I10 Essential (primary) hypertension: Secondary | ICD-10-CM | POA: Diagnosis not present

## 2021-11-03 DIAGNOSIS — D500 Iron deficiency anemia secondary to blood loss (chronic): Secondary | ICD-10-CM | POA: Diagnosis present

## 2021-11-03 DIAGNOSIS — E1142 Type 2 diabetes mellitus with diabetic polyneuropathy: Secondary | ICD-10-CM | POA: Diagnosis not present

## 2021-11-03 DIAGNOSIS — Z20822 Contact with and (suspected) exposure to covid-19: Secondary | ICD-10-CM | POA: Diagnosis present

## 2021-11-03 DIAGNOSIS — N400 Enlarged prostate without lower urinary tract symptoms: Secondary | ICD-10-CM | POA: Diagnosis present

## 2021-11-03 DIAGNOSIS — Z7189 Other specified counseling: Secondary | ICD-10-CM | POA: Diagnosis not present

## 2021-11-03 DIAGNOSIS — E1122 Type 2 diabetes mellitus with diabetic chronic kidney disease: Secondary | ICD-10-CM | POA: Diagnosis present

## 2021-11-03 LAB — PREPARE RBC (CROSSMATCH)

## 2021-11-03 LAB — HEMOGLOBIN AND HEMATOCRIT, BLOOD
HCT: 24.7 % — ABNORMAL LOW (ref 39.0–52.0)
Hemoglobin: 7.9 g/dL — ABNORMAL LOW (ref 13.0–17.0)

## 2021-11-03 LAB — CBG MONITORING, ED
Glucose-Capillary: 112 mg/dL — ABNORMAL HIGH (ref 70–99)
Glucose-Capillary: 124 mg/dL — ABNORMAL HIGH (ref 70–99)

## 2021-11-03 LAB — PROTIME-INR
INR: 1.3 — ABNORMAL HIGH (ref 0.8–1.2)
Prothrombin Time: 15.8 seconds — ABNORMAL HIGH (ref 11.4–15.2)

## 2021-11-03 LAB — PATHOLOGIST SMEAR REVIEW

## 2021-11-03 LAB — LACTIC ACID, PLASMA
Lactic Acid, Venous: 1.3 mmol/L (ref 0.5–1.9)
Lactic Acid, Venous: 1.3 mmol/L (ref 0.5–1.9)

## 2021-11-03 LAB — POC OCCULT BLOOD, ED: Fecal Occult Bld: NEGATIVE

## 2021-11-03 MED ORDER — ESCITALOPRAM OXALATE 10 MG PO TABS
5.0000 mg | ORAL_TABLET | Freq: Every day | ORAL | Status: DC
  Administered 2021-11-03 – 2021-11-07 (×5): 5 mg via ORAL
  Filled 2021-11-03 (×5): qty 1

## 2021-11-03 MED ORDER — ONDANSETRON HCL 4 MG PO TABS: 4.0000 mg | ORAL_TABLET | Freq: Four times a day (QID) | ORAL | Status: DC | PRN

## 2021-11-03 MED ORDER — FAMOTIDINE 20 MG PO TABS
20.0000 mg | ORAL_TABLET | Freq: Two times a day (BID) | ORAL | Status: DC
  Administered 2021-11-03 – 2021-11-07 (×9): 20 mg via ORAL
  Filled 2021-11-03 (×9): qty 1

## 2021-11-03 MED ORDER — ALBUTEROL SULFATE (2.5 MG/3ML) 0.083% IN NEBU: 2.5000 mg | INHALATION_SOLUTION | Freq: Four times a day (QID) | RESPIRATORY_TRACT | Status: DC | PRN

## 2021-11-03 MED ORDER — BUSPIRONE HCL 15 MG PO TABS
7.5000 mg | ORAL_TABLET | Freq: Every day | ORAL | Status: DC
  Administered 2021-11-03 – 2021-11-06 (×4): 7.5 mg via ORAL
  Filled 2021-11-03 (×4): qty 1

## 2021-11-03 MED ORDER — SODIUM CHLORIDE 0.9 % IV SOLN
2.0000 g | Freq: Once | INTRAVENOUS | Status: AC
  Administered 2021-11-03: 13:00:00 2 g via INTRAVENOUS
  Filled 2021-11-03: qty 20

## 2021-11-03 MED ORDER — FINASTERIDE 5 MG PO TABS
5.0000 mg | ORAL_TABLET | Freq: Every day | ORAL | Status: DC
  Administered 2021-11-03 – 2021-11-07 (×5): 5 mg via ORAL
  Filled 2021-11-03 (×5): qty 1

## 2021-11-03 MED ORDER — POLYVINYL ALCOHOL 1.4 % OP SOLN
1.0000 [drp] | Freq: Every day | OPHTHALMIC | Status: DC
  Administered 2021-11-03 – 2021-11-06 (×4): 1 [drp] via OPHTHALMIC
  Filled 2021-11-03: qty 15

## 2021-11-03 MED ORDER — TAMSULOSIN HCL 0.4 MG PO CAPS
0.4000 mg | ORAL_CAPSULE | Freq: Every day | ORAL | Status: DC
  Administered 2021-11-03 – 2021-11-07 (×5): 0.4 mg via ORAL
  Filled 2021-11-03 (×5): qty 1

## 2021-11-03 MED ORDER — ONDANSETRON HCL 4 MG/2ML IJ SOLN: 4.0000 mg | Freq: Four times a day (QID) | INTRAMUSCULAR | Status: DC | PRN

## 2021-11-03 MED ORDER — POLYVINYL ALCOHOL-POVIDONE PF 1.4-0.6 % OP SOLN
1.0000 [drp] | Freq: Every day | OPHTHALMIC | Status: DC
Start: 2021-11-03 — End: 2021-11-03

## 2021-11-03 MED ORDER — CARVEDILOL 6.25 MG PO TABS
6.2500 mg | ORAL_TABLET | Freq: Two times a day (BID) | ORAL | Status: DC
  Administered 2021-11-03 – 2021-11-06 (×6): 6.25 mg via ORAL
  Filled 2021-11-03 (×2): qty 1
  Filled 2021-11-03: qty 2
  Filled 2021-11-03 (×3): qty 1

## 2021-11-03 MED ORDER — ROSUVASTATIN CALCIUM 5 MG PO TABS
10.0000 mg | ORAL_TABLET | Freq: Every day | ORAL | Status: DC
  Administered 2021-11-03 – 2021-11-07 (×5): 10 mg via ORAL
  Filled 2021-11-03 (×5): qty 2

## 2021-11-03 MED ORDER — SODIUM CHLORIDE 0.9% IV SOLUTION: Freq: Once | INTRAVENOUS | Status: AC

## 2021-11-03 MED ORDER — SODIUM CHLORIDE 0.9 % IV SOLN
2.0000 g | INTRAVENOUS | Status: DC
  Administered 2021-11-04 – 2021-11-05 (×2): 2 g via INTRAVENOUS
  Filled 2021-11-03 (×2): qty 20

## 2021-11-03 MED ORDER — LOPERAMIDE HCL 2 MG PO CAPS: 2.0000 mg | ORAL_CAPSULE | Freq: Three times a day (TID) | ORAL | Status: DC | PRN

## 2021-11-03 MED ORDER — SPIRONOLACTONE 25 MG PO TABS
75.0000 mg | ORAL_TABLET | Freq: Every day | ORAL | Status: DC
  Administered 2021-11-03 – 2021-11-06 (×4): 75 mg via ORAL
  Filled 2021-11-03 (×5): qty 3

## 2021-11-03 MED ORDER — ALBUMIN HUMAN 25 % IV SOLN
50.0000 g | Freq: Once | INTRAVENOUS | Status: AC
  Administered 2021-11-03: 13:00:00 50 g via INTRAVENOUS
  Filled 2021-11-03: qty 200

## 2021-11-03 MED ORDER — ACETAMINOPHEN 650 MG RE SUPP: 650.0000 mg | Freq: Four times a day (QID) | RECTAL | Status: DC | PRN

## 2021-11-03 MED ORDER — FUROSEMIDE 40 MG PO TABS
40.0000 mg | ORAL_TABLET | Freq: Every day | ORAL | Status: DC
  Administered 2021-11-03 – 2021-11-06 (×4): 40 mg via ORAL
  Filled 2021-11-03 (×3): qty 1
  Filled 2021-11-03: qty 2

## 2021-11-03 MED ORDER — GABAPENTIN 300 MG PO CAPS
300.0000 mg | ORAL_CAPSULE | Freq: Two times a day (BID) | ORAL | Status: DC
  Administered 2021-11-03 – 2021-11-07 (×9): 300 mg via ORAL
  Filled 2021-11-03 (×9): qty 1

## 2021-11-03 MED ORDER — SACUBITRIL-VALSARTAN 24-26 MG PO TABS
1.0000 | ORAL_TABLET | Freq: Two times a day (BID) | ORAL | Status: DC
  Administered 2021-11-03 – 2021-11-06 (×6): 1 via ORAL
  Filled 2021-11-03 (×9): qty 1

## 2021-11-03 MED ORDER — SODIUM CHLORIDE 0.9 % IV BOLUS
1000.0000 mL | Freq: Once | INTRAVENOUS | Status: AC
Start: 2021-11-03 — End: 2021-11-03
  Administered 2021-11-03: 13:00:00 1000 mL via INTRAVENOUS

## 2021-11-03 MED ORDER — SODIUM CHLORIDE 0.9% FLUSH
3.0000 mL | Freq: Two times a day (BID) | INTRAVENOUS | Status: DC
  Administered 2021-11-03 – 2021-11-07 (×9): 3 mL via INTRAVENOUS

## 2021-11-03 MED ORDER — INSULIN ASPART 100 UNIT/ML IJ SOLN
0.0000 [IU] | Freq: Three times a day (TID) | INTRAMUSCULAR | Status: DC
  Administered 2021-11-04: 3 [IU] via SUBCUTANEOUS
  Administered 2021-11-05 – 2021-11-06 (×2): 2 [IU] via SUBCUTANEOUS
  Administered 2021-11-06: 1 [IU] via SUBCUTANEOUS
  Administered 2021-11-07: 2 [IU] via SUBCUTANEOUS

## 2021-11-03 MED ORDER — ACETAMINOPHEN 325 MG PO TABS
650.0000 mg | ORAL_TABLET | Freq: Four times a day (QID) | ORAL | Status: DC | PRN
  Administered 2021-11-04 – 2021-11-06 (×3): 650 mg via ORAL
  Filled 2021-11-03 (×3): qty 2

## 2021-11-03 NOTE — Telephone Encounter (Signed)
Noted. Thanks.

## 2021-11-03 NOTE — ED Notes (Signed)
Pt had a hard medium bowel movement

## 2021-11-03 NOTE — ED Notes (Signed)
Aldactone is not verified at this time

## 2021-11-03 NOTE — Telephone Encounter (Signed)
Patient's son called regarding patient.  They have been sitting in the ER waiting room since 7:30 last night and they keep putting patient's ahead of him.  Is there anything we can do to expedite his being seen or any advice we can give them?  Possibly let Judson Roch know that he's there and waiting to see if there is anything she can do?  Please call patient's son, [name], and advise.  Thank you.  Randy's phone #  [phone]

## 2021-11-03 NOTE — Telephone Encounter (Signed)
Dr Hilarie Fredrickson, is there anything you can do to help the patient?
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
## 2021-11-03 NOTE — ED Provider Notes (Signed)
[facility] EMERGENCY DEPARTMENT Provider Note   CSN: [id number] Arrival date & time: 11/02/21  1953     History  Chief Complaint  Patient presents with   Abnormal Blood Test Results    [name] is a 81 y.o. male.  HPI 81 year old male with a history of DM type II, hyperlipidemia, hypertension, COPD, CKD stage III, CHF, hepatocellular carcinoma, cirrhosis, esophageal varices presents to the ER with concern for SBP.  Patient had a 6 L paracentesis performed by interventional radiology yesterday 1/24, cell count showed PMN 895 likely consistent with SBP.  He had received 50 g of albumin x1 after the paracentesis.  GI NP had spoken with Dr. Stefani Dama Roddy who recommended sending the patient to the ER.  Patient states he does not have any significant abdominal pain, though he feels like the fluid that was taken off yesterday is already back on his abdomen.  He also complains of a chronic cough which is been ongoing for the last 2 weeks.  Patient reports he has been having diarrhea and poor p.o. intake for the last 4 months.  He denies any known fevers, chills, chest pain, shortness of breath.  Home Medications Prior to Admission medications   Medication Sig Start Date End Date Taking? Authorizing Provider  acetaminophen (TYLENOL) 500 MG tablet Take 1,500 mg by mouth in the morning and at bedtime.   Yes [provider]  albuterol (VENTOLIN HFA) 108 (90 Base) MCG/ACT inhaler Inhale 2 puffs into the lungs every 6 (six) hours as needed for wheezing or shortness of breath. 11/01/21  Yes [name], MD  aspirin EC 81 MG tablet Take 81 mg by mouth daily.   Yes [provider]  busPIRone (BUSPAR) 7.5 MG tablet Take 1 tablet (7.5 mg total) by mouth 2 (two) times daily as needed. for anxiety Patient taking differently: Take 7.5 mg by mouth at bedtime. 05/14/21  Yes [name], MD  carvedilol (COREG) 6.25 MG tablet Take 1 tablet (6.25 mg total) by  mouth 2 (two) times daily with a meal. 09/06/21  Yes Satira Sark, MD  escitalopram (LEXAPRO) 5 MG tablet Take 1 tablet (5 mg total) by mouth daily. 05/14/21  Yes [name], MD  famotidine (PEPCID) 20 MG tablet Take 20 mg by mouth 2 (two) times daily. 08/13/21  Yes [provider]  finasteride (PROSCAR) 5 MG tablet TAKE 1 TABLET EVERY DAY Patient taking differently: Take 5 mg by mouth daily. 05/26/21  Yes [name], MD  furosemide (LASIX) 20 MG tablet Take 2 tablets (40 mg total) by mouth daily. 09/17/21  Yes Esterwood, Amy S, PA-C  gabapentin (NEURONTIN) 300 MG capsule TAKE 1 CAPSULE TWICE DAILY Patient taking differently: Take 300 mg by mouth 2 (two) times daily. 03/15/21  Yes [name], MD  loperamide (IMODIUM) 2 MG capsule Take 1 capsule (2 mg total) by mouth 3 (three) times daily between meals as needed for diarrhea or loose stools. 07/19/21  Yes Regalado, Belkys A, MD  Multiple Vitamin (MULTIVITAMIN WITH MINERALS) TABS tablet Take 1 tablet by mouth daily. 07/20/21  Yes Regalado, Belkys A, MD  Polyvinyl Alcohol-Povidone (REFRESH OP) Place 1 drop into both eyes at bedtime.   Yes [provider]  rosuvastatin (CRESTOR) 10 MG tablet TAKE 1 TABLET EVERY DAY Patient taking differently: Take 10 mg by mouth daily. TAKE 1 TABLET EVERY DAY 07/28/21  Yes [name], MD  sacubitril-valsartan (ENTRESTO) 24-26 MG Take 1  tablet by mouth 2 (two) times daily. 10/21/21  Yes Satira Sark, MD  spironolactone (ALDACTONE) 50 MG tablet Take 1.5 tablets (75 mg total) by mouth daily. 10/26/21  Yes Noralyn Pick, NP  tamsulosin (FLOMAX) 0.4 MG CAPS capsule TAKE 1 CAPSULE EVERY DAY Patient taking differently: Take 0.4 mg by mouth daily. 05/26/21  Yes [name], MD  Alcohol Swabs (DROPSAFE ALCOHOL PREP) 70 % PADS USE  UP  TO FOUR TIMES DAILY AS DIRECTED 10/25/21   [name], MD  Blood Glucose Monitoring Suppl (TRUE METRIX AIR GLUCOSE METER)  w/Device KIT Use to check blood sugars daily. Dx: E11.9 07/26/21   [name], MD  oxyCODONE (ROXICODONE) 5 MG immediate release tablet Take 1 tablet (5 mg total) by mouth every 8 (eight) hours. Only take if family is close Patient not taking: Reported on 11/03/2021 11/01/21   [name], MD  TRUE METRIX BLOOD GLUCOSE TEST test strip TEST BLOOD SUGAR  UP  TO FOUR TIMES DAILY AS DIRECTED 10/25/21   [name], MD  TRUEplus Lancets 28G MISC TEST BLOOD SUGAR  UP  TO FOUR TIMES DAILY AS DIRECTED 08/23/21   [name], MD      Allergies    Patient has no known allergies.    Review of Systems   Review of Systems Ten systems reviewed and are negative for acute change, except as noted in the HPI.   Physical Exam Updated Vital Signs BP 132/64    Pulse (!) 116    Temp 99.3 F (37.4 C) (Oral)    Resp (!) 31    Ht '5\' 11"'  (1.803 m)    Wt 91 kg    SpO2 96%    BMI 27.98 kg/m  Physical Exam Vitals and nursing note reviewed.  Constitutional:      General: He is not in acute distress.    Appearance: He is well-developed.  HENT:     Head: Normocephalic and atraumatic.  Eyes:     Conjunctiva/sclera: Conjunctivae normal.  Cardiovascular:     Rate and Rhythm: Normal rate and regular rhythm.     Heart sounds: No murmur heard. Pulmonary:     Effort: Pulmonary effort is normal. No respiratory distress.     Breath sounds: Normal breath sounds.  Abdominal:     General: There is distension.     Palpations: Abdomen is soft.     Tenderness: There is no abdominal tenderness. There is no right CVA tenderness or left CVA tenderness.     Comments: Abdomen is very distended, firm, however not tender.  No CVA tenderness.  Musculoskeletal:        General: No swelling.     Cervical back: Neck supple.  Skin:    General: Skin is warm and dry.     Capillary Refill: Capillary refill takes less than 2 seconds.  Neurological:     Mental Status: He is alert.  Psychiatric:        Mood and  Affect: Mood normal.    ED Results / Procedures / Treatments   Labs (all labs ordered are listed, but only abnormal results are displayed) Labs Reviewed  CBC WITH DIFFERENTIAL/PLATELET - Abnormal; Notable for the following components:      Result Value   RBC 2.59 (*)    Hemoglobin 8.8 (*)    HCT 27.6 (*)    MCV 106.6 (*)    RDW 16.4 (*)    Platelets 146 (*)  Lymphs Abs 0.5 (*)    All other components within normal limits  COMPREHENSIVE METABOLIC PANEL - Abnormal; Notable for the following components:   Glucose, Bld 194 (*)    Calcium 8.5 (*)    Total Protein 5.9 (*)    Albumin 2.7 (*)    AST 47 (*)    Alkaline Phosphatase 240 (*)    All other components within normal limits  LIPASE, BLOOD - Abnormal; Notable for the following components:   Lipase 55 (*)    All other components within normal limits  LACTIC ACID, PLASMA - Abnormal; Notable for the following components:   Lactic Acid, Venous 2.5 (*)    All other components within normal limits  URINALYSIS, ROUTINE W REFLEX MICROSCOPIC - Abnormal; Notable for the following components:   Leukocytes,Ua LARGE (*)    Bacteria, UA RARE (*)    All other components within normal limits  CULTURE, BLOOD (ROUTINE X 2)  CULTURE, BLOOD (ROUTINE X 2)  RESP PANEL BY RT-PCR (FLU A&B, COVID) ARPGX2  URINE CULTURE  LACTIC ACID, PLASMA  LACTIC ACID, PLASMA  LACTIC ACID, PLASMA    EKG None  Radiology DG Chest 2 View  Result Date: 11/03/2021 CLINICAL DATA:  Persistent cough EXAM: CHEST - 2 VIEW COMPARISON:  Chest x-ray 07/15/2021 FINDINGS: Heart size is within normal limits. Calcified plaques in the aortic arch. Chronic mildly prominent interstitial lung markings bilaterally. No focal consolidation identified. No pleural effusion or pneumothorax visualized. IMPRESSION: No acute intrathoracic process identified. Electronically Signed   By: Ofilia Neas M.D.   On: 11/03/2021 13:28   IR Paracentesis  Result Date:  11/02/2021 INDICATION: Patient with history of cirrhosis and hepatocellular carcinoma with recurrent ascites. Request is for therapeutic paracentesis. 7 L maximum EXAM: ULTRASOUND GUIDED THERAPEUTIC PARACENTESIS MEDICATIONS: Lidocaine 1% 10 mL COMPLICATIONS: None immediate. PROCEDURE: Informed written consent was obtained from the patient after a discussion of the risks, benefits and alternatives to treatment. A timeout was performed prior to the initiation of the procedure. Initial ultrasound scanning demonstrates a large amount of ascites within the right lower abdominal quadrant. The right lower abdomen was prepped and draped in the usual sterile fashion. 1% lidocaine was used for local anesthesia. Following this, a 19 gauge, 7-cm, Yueh catheter was introduced. An ultrasound image was saved for documentation purposes. The paracentesis was performed. The catheter was removed and a dressing was applied. The patient tolerated the procedure well without immediate post procedural complication. Patient received post-procedure intravenous albumin; see nursing notes for details. FINDINGS: A total of approximately 6 of straw-colored fluid was removed. IMPRESSION: Successful ultrasound-guided therapeutic paracentesis yielding 6 liters of peritoneal fluid. Read by: Rushie Nyhan, NP Electronically Signed   By: Corrie Mckusick D.O.   On: 11/02/2021 15:37    Procedures Procedures    Medications Ordered in ED Medications  cefTRIAXone (ROCEPHIN) 2 g in sodium chloride 0.9 % 100 mL IVPB (0 g Intravenous Stopped 11/03/21 1333)  albumin human 25 % solution 50 g (50 g Intravenous New Bag/Given 11/03/21 1327)  sodium chloride 0.9 % bolus 1,000 mL (1,000 mLs Intravenous New Bag/Given 11/03/21 1248)    ED Course/ Medical Decision Making/ A&P                           Medical Decision Making Amount and/or Complexity of Data Reviewed Labs: ordered.  Risk Prescription drug management. Decision regarding  hospitalization.  81 year old male with hepatocellular carcinoma, ascites, COPD, DM type  II, hypertension, CKD stage III, CHFpresented to the ER after being sent by GI with concerns for SBP.  On arrival, he is well-appearing, in no acute distress, ambulating about the room without difficulty.  Blood pressure is soft with a BP of 106/93, tachycardic with a rate of 119, respirations 26. Abdomen is distended and firm, however nontender.  He has been in the ER waiting room for approximately 16 hours, lab work drawn in [place] triage, which I personally reviewed and interpreted.  CMP with mild AST elevation, no electrolyte abnormalities, elevated alk phos of 240.  Normal renal function.  CBC with a hemoglobin of 8.8, which is a three-point drop from 11.112 days ago.  Patient denies any acute bleeding, no dark stools or hematemesis.  UA with large leukocytes and rare bacteria, sent for culture.  Blood cultures drawn in the waiting room yesterday so far with no growth.  COVID and flu are negative.  Initial lactic of 2.5, plan to repeat.  I personally reviewed the fluid culture results from his paracentesis yesterday, cultures w/ no growth so far.  I also spoke with the patient's son at bedside who contributed to his history.  Critical interventions included ordering antibiotics (Rocephin), 1 L fluid bolus, 50 g of 25% albumin.  Consulted [facility] who will see and  evaluate the patient.  CXR ordered for persistent cough, however no evidence of PNA on chest xray. Consulted hospitalist team for admission for further antibiotic treatment and management.    Spoke with Dr. Tamala Julian who will admit the for further evaluation and treatment   This was a shared visit with my supervising physician Dr. Pearline Cables who independently saw and evaluated the patient & provided guidance in evaluation/management/disposition ,in agreement with care   Final Clinical Impression(s) / ED Diagnoses Final diagnoses:  SBP (spontaneous bacterial  peritonitis) [facility])    Rx / DC Orders ED Discharge Orders     None         Garald Balding, PA-C 11/03/21 [place], [place], DO 11/03/21 1754

## 2021-11-03 NOTE — ED Notes (Signed)
Patient is resting comfortably. 

## 2021-11-03 NOTE — ED Notes (Signed)
Pt ambulatory to [facility]

## 2021-11-03 NOTE — Telephone Encounter (Signed)
I called [name], the son. I apologized for the wait and thanked him for the patient's and family's understanding. He expresses appreciation and understanding. He is only worried that his father is too tired and will "walk out in frustration." He is very tired.

## 2021-11-03 NOTE — Telephone Encounter (Signed)
I spoke to the ER charge nurse directly by phone who stated the ER has been very busy however they have now roomed the patient and he will begin recommended therapy for SBP  I will alert [facility] team for this week.

## 2021-11-03 NOTE — H&P (Signed)
History and Physical    [name] OZD:[id number] DOB: [birth year] DOA: 11/02/2021  Referring MD/NP/PA: Sharyn Lull PCP: [name], MD  Patient coming from: Home  Chief Complaint: Abnormal test results  I have personally briefly reviewed patient's old medical records in [place]   HPI: [name] is a 81 y.o. male with medical history significant of diastolic CHF, diabetes mellitus type 2, NASH related cirrhosis with ascites, splenomegaly, thrombocytopenia, hepatocellular carcinoma s/p SBRT presents after being advised to come to the hospital for abnormal test results.  He had paracentesis yesterday with 6 L of fluid removed given 50 g of albumin x1 post paracentesis.  Cell count showed PMNs of 895 consistent with spontaneous bacterial peritonitis.  Patient notes that he has been dealing with diarrhea over the last 4 months for which he has been having a bowel movement every 15 minutes or anytime he tries to eat anything.  He chronically has chills for which he wears a warming blanket at home, shortness of breath, and has had a nonproductive cough over the last couple of days.  States that his belly is softer since the paracentesis, but feels like fluid is coming back.  His prior paracentesis have been on 1/3 and at that time there was no signs of infection of that fluid.  He denies having any abdominal pain or fevers.  Patient admits to: A week ago causing a skin tear to his right arm.  He has been tested several times for COVID-19 and been noted to be negative.  ED Course: On admission to the emergency department patient was seen to be afebrile, pulse 103-119, blood pressures maintained, and O2 saturations maintained.  Labs significant for hemoglobin 8.8, platelets 146, BUN 22, creatinine 1.16, glucose 194, alkaline phosphatase 240, albumin 2.7, AST 55, ALT 47, and lactic acid 2.5->1.3.  Urinalysis was positive for large leukocytes, rare bacteria, and 21-50 WBCs.  Influenza  and COVID-19 screening were negative.  GI have been formally consulted.  Patient was given 1 L normal saline IV fluids, Rocephin, and albumin 50 g  Review of Systems  Constitutional:  Negative for fever.  Eyes:  Negative for pain.  Respiratory:  Positive for cough and shortness of breath. Negative for sputum production.   Cardiovascular:  Negative for chest pain.  Gastrointestinal:  Positive for diarrhea. Negative for blood in stool and vomiting.  Genitourinary:  Negative for hematuria.  Musculoskeletal:  Positive for falls.  Neurological:  Positive for weakness. Negative for loss of consciousness.  Psychiatric/Behavioral:  Negative for substance abuse.    Past Medical History:  Diagnosis Date   Benign prostatic hypertrophy    Blood transfusion without reported diagnosis    CHF (congestive heart failure) ([place])    a. EF 20-25% by echo in 11/2019   COPD (chronic obstructive pulmonary disease) ([place]) 12/15/2009   FEV1 2.30 (70%) ratio 63 no better with B2 and DLCO 18/6 (73%) corrects to 106%   Degenerative joint disease    Depression    Diverticulosis    Esophageal varices (HCC)    Essential hypertension    ACE inhibitor cough   Fatty liver    Hepatocellular carcinoma (HCC)    Hyperlipidemia    Internal hemorrhoids    Liver cancer ([place])    Low back pain    Otitis externa 07/30/2012   Portal hypertensive gastropathy (HCC)    Splenomegaly    Thrombocytopenia (HCC)    Type 2 diabetes mellitus (HCC)    Ulcer  of foot [facility])    Right foot    Past Surgical History:  Procedure Laterality Date   BIOPSY  07/14/2021   Procedure: BIOPSY;  Surgeon: Daryel November, MD;  Location: [facility] ENDOSCOPY;  Service: Gastroenterology;;  EGD and COLON   COLONOSCOPY  2020   JMP-MAC-plenvu (good)-polyps-recall 1 yr   COLONOSCOPY WITH PROPOFOL N/A 07/14/2021   Procedure: COLONOSCOPY WITH PROPOFOL;  Surgeon: Daryel November, MD;  Location: [place];  Service: Gastroenterology;  Laterality: N/A;    ESOPHAGOGASTRODUODENOSCOPY (EGD) WITH PROPOFOL N/A 07/14/2021   Procedure: ESOPHAGOGASTRODUODENOSCOPY (EGD) WITH PROPOFOL;  Surgeon: Daryel November, MD;  Location: [place];  Service: Gastroenterology;  Laterality: N/A;   IR EMBO TUMOR ORGAN ISCHEMIA INFARCT INC GUIDE ROADMAPPING  06/01/2020   IR PARACENTESIS  08/30/2021   IR PARACENTESIS  09/22/2021   IR PARACENTESIS  10/12/2021   IR PARACENTESIS  11/02/2021   IR RADIOLOGIST EVAL & MGMT  04/28/2020   IR RADIOLOGIST EVAL & MGMT  07/15/2020   IR RADIOLOGIST EVAL & MGMT  05/04/2021   NO PAST SURGERIES  1980     reports that he quit smoking about 37 years ago. His smoking use included cigarettes. He has a 80.00 pack-year smoking history. He has never used smokeless tobacco. He reports that he does not drink alcohol and does not use drugs.  No Known Allergies  Family History  Problem Relation Age of Onset   Melanoma Mother    Stroke Father    CAD Brother        Premature disease   Colon polyps Neg Hx    Colon cancer Neg Hx    Esophageal cancer Neg Hx    Stomach cancer Neg Hx    Rectal cancer Neg Hx     Prior to Admission medications   Medication Sig Start Date End Date Taking? Authorizing Provider  acetaminophen (TYLENOL) 500 MG tablet Take 1,500 mg by mouth in the morning and at bedtime.   Yes [provider]  albuterol (VENTOLIN HFA) 108 (90 Base) MCG/ACT inhaler Inhale 2 puffs into the lungs every 6 (six) hours as needed for wheezing or shortness of breath. 11/01/21  Yes [name], MD  aspirin EC 81 MG tablet Take 81 mg by mouth daily.   Yes [provider]  busPIRone (BUSPAR) 7.5 MG tablet Take 1 tablet (7.5 mg total) by mouth 2 (two) times daily as needed. for anxiety Patient taking differently: Take 7.5 mg by mouth at bedtime. 05/14/21  Yes [name], MD  carvedilol (COREG) 6.25 MG tablet Take 1 tablet (6.25 mg total) by mouth 2 (two) times daily with a meal. 09/06/21  Yes Satira Sark, MD  escitalopram (LEXAPRO) 5 MG tablet Take 1 tablet (5 mg total) by mouth daily. 05/14/21  Yes [name], MD  famotidine (PEPCID) 20 MG tablet Take 20 mg by mouth 2 (two) times daily. 08/13/21  Yes [provider]  finasteride (PROSCAR) 5 MG tablet TAKE 1 TABLET EVERY DAY Patient taking differently: Take 5 mg by mouth daily. 05/26/21  Yes [name], MD  furosemide (LASIX) 20 MG tablet Take 2 tablets (40 mg total) by mouth daily. 09/17/21  Yes Esterwood, Amy S, PA-C  gabapentin (NEURONTIN) 300 MG capsule TAKE 1 CAPSULE TWICE DAILY Patient taking differently: Take 300 mg by mouth 2 (two) times daily. 03/15/21  Yes [name], MD  loperamide (IMODIUM) 2 MG capsule Take 1 capsule (2 mg total) by mouth 3 (  three) times daily between meals as needed for diarrhea or loose stools. 07/19/21  Yes Regalado, Belkys A, MD  Multiple Vitamin (MULTIVITAMIN WITH MINERALS) TABS tablet Take 1 tablet by mouth daily. 07/20/21  Yes Regalado, Belkys A, MD  Polyvinyl Alcohol-Povidone (REFRESH OP) Place 1 drop into both eyes at bedtime.   Yes [provider]  rosuvastatin (CRESTOR) 10 MG tablet TAKE 1 TABLET EVERY DAY Patient taking differently: Take 10 mg by mouth daily. TAKE 1 TABLET EVERY DAY 07/28/21  Yes [name], MD  sacubitril-valsartan (ENTRESTO) 24-26 MG Take 1 tablet by mouth 2 (two) times daily. 10/21/21  Yes Satira Sark, MD  spironolactone (ALDACTONE) 50 MG tablet Take 1.5 tablets (75 mg total) by mouth daily. 10/26/21  Yes Noralyn Pick, NP  tamsulosin (FLOMAX) 0.4 MG CAPS capsule TAKE 1 CAPSULE EVERY DAY Patient taking differently: Take 0.4 mg by mouth daily. 05/26/21  Yes [name], MD  Alcohol Swabs (DROPSAFE ALCOHOL PREP) 70 % PADS USE  UP  TO FOUR TIMES DAILY AS DIRECTED 10/25/21   [name], MD  Blood Glucose Monitoring Suppl (TRUE METRIX AIR GLUCOSE METER) w/Device KIT Use to check blood sugars daily. Dx: E11.9 07/26/21    [name], MD  oxyCODONE (ROXICODONE) 5 MG immediate release tablet Take 1 tablet (5 mg total) by mouth every 8 (eight) hours. Only take if family is close Patient not taking: Reported on 11/03/2021 11/01/21   [name], MD  TRUE METRIX BLOOD GLUCOSE TEST test strip TEST BLOOD SUGAR  UP  TO FOUR TIMES DAILY AS DIRECTED 10/25/21   [name], MD  TRUEplus Lancets 28G MISC TEST BLOOD SUGAR  UP  TO FOUR TIMES DAILY AS DIRECTED 08/23/21   [name], MD    Physical Exam:  Constitutional: Chronically ill-appearing elderly male Vitals:   11/03/21 0939 11/03/21 1151 11/03/21 1232 11/03/21 1300  BP: 116/74 (!) 131/104 (!) 106/93 132/66  Pulse: (!) 113 (!) 117 (!) 119 (!) 116  Resp: 18 18 (!) 26 (!) 27  Temp: 99.6 F (37.6 C) 99.3 F (37.4 C)    TempSrc: Oral Oral    SpO2: 95% 97% 96% 99%  Weight:      Height:       Eyes: PERRL, lids and conjunctivae normal ENMT: Mucous membranes are moist. Posterior pharynx clear of any exudate or lesions.Normal dentition.  Neck: normal, supple, no masses, no thyromegaly Respiratory: Decreased aeration with no significant wheezes or rhonchi appreciated. O2 saturations currently maintained on room air Cardiovascular: Tachycardic no murmurs / rubs / gallops.  +1 pitting lower extremity edema.   Abdomen: Distended abdomen with positive fluid wave, no significant tenderness to palpation appreciated at this time. Musculoskeletal: no clubbing / cyanosis. No joint deformity upper and lower extremities. Good ROM, no contractures. Normal muscle tone.  Skin: Bilateral upper extremities bruising appreciated and scabbed wound. Neurologic: CN 2-12 grossly intact. Strength 5/5 in all 4.  Psychiatric: Mild memory impairment. Alert and oriented x person, place, and situation. Normal mood.     Labs on Admission: I have personally reviewed following labs and imaging studies  CBC: Recent Labs  Lab 11/02/21 2105  WBC 5.2  NEUTROABS 4.2  HGB  8.8*  HCT 27.6*  MCV 106.6*  PLT 620*   Basic Metabolic Panel: Recent Labs  Lab 11/02/21 2105  NA 137  K 4.1  CL 101  CO2 28  GLUCOSE 194*  BUN 22  CREATININE 1.16  CALCIUM  8.5*   GFR: Estimated Creatinine Clearance: 58.6 mL/min (by C-G formula based on SCr of 1.16 mg/dL). Liver Function Tests: Recent Labs  Lab 11/02/21 2105  AST 47*  ALT 25  ALKPHOS 240*  BILITOT 0.9  PROT 5.9*  ALBUMIN 2.7*   Recent Labs  Lab 11/02/21 2105  LIPASE 55*   No results for input(s): AMMONIA in the last 168 hours. Coagulation Profile: No results for input(s): INR, PROTIME in the last 168 hours. Cardiac Enzymes: No results for input(s): CKTOTAL, CKMB, CKMBINDEX, TROPONINI in the last 168 hours. BNP (last 3 results) No results for input(s): PROBNP in the last 8760 hours. HbA1C: No results for input(s): HGBA1C in the last 72 hours. CBG: No results for input(s): GLUCAP in the last 168 hours. Lipid Profile: No results for input(s): CHOL, HDL, LDLCALC, TRIG, CHOLHDL, LDLDIRECT in the last 72 hours. Thyroid Function Tests: No results for input(s): TSH, T4TOTAL, FREET4, T3FREE, THYROIDAB in the last 72 hours. Anemia Panel: No results for input(s): VITAMINB12, FOLATE, FERRITIN, TIBC, IRON, RETICCTPCT in the last 72 hours. Urine analysis:    Component Value Date/Time   COLORURINE YELLOW 11/02/2021 2125   APPEARANCEUR CLEAR 11/02/2021 2125   LABSPEC 1.026 11/02/2021 2125   PHURINE 5.0 11/02/2021 2125   GLUCOSEU NEGATIVE 11/02/2021 2125   GLUCOSEU NEGATIVE 10/22/2018 1451   HGBUR NEGATIVE 11/02/2021 2125   HGBUR negative 05/14/2008 1032   BILIRUBINUR NEGATIVE 11/02/2021 2125   BILIRUBINUR neg 05/15/2017 1330   KETONESUR NEGATIVE 11/02/2021 2125   PROTEINUR NEGATIVE 11/02/2021 2125   UROBILINOGEN 0.2 10/22/2018 1451   NITRITE NEGATIVE 11/02/2021 2125   LEUKOCYTESUR LARGE (A) 11/02/2021 2125   Sepsis Labs: Recent Results (from the past 240 hour(s))  Culture, body fluid w Gram  Stain-bottle     Status: None (Preliminary result)   Collection Time: 11/02/21  1:23 PM   Specimen: Peritoneal Washings  Result Value Ref Range Status   Specimen Description PERITONEAL  Final   Special Requests ABDOMEN  Final   Gram Stain   Final    WBC PRESENT,BOTH PMN AND MONONUCLEAR NO ORGANISMS SEEN CYTOSPIN SMEAR CRITICAL RESULT CALLED TO, READ BACK BY AND VERIFIED WITH: BOBBY,RN_[id number]  11/03/21 [place] GRAM POSITIVE RODS ANAEROBIC BOTTLE ONLY    Culture   Final    NO GROWTH < 24 HOURS Performed at [facility], [place] [address], [place], [place] [address]    Report Status PENDING  Incomplete  Blood culture (routine x 2)     Status: None (Preliminary result)   Collection Time: 11/02/21  9:00 PM   Specimen: BLOOD LEFT ARM  Result Value Ref Range Status   Specimen Description BLOOD LEFT ARM  Final   Special Requests   Final    BOTTLES DRAWN AEROBIC ONLY Blood Culture adequate volume   Culture   Final    NO GROWTH < 12 HOURS Performed at [facility], [place] [address], [place], [place] [address]    Report Status PENDING  Incomplete  Resp Panel by RT-PCR (Flu A&B, Covid) Peripheral     Status: None   Collection Time: 11/02/21  9:35 PM   Specimen: Peripheral; Nasopharyngeal(NP) swabs in vial transport medium  Result Value Ref Range Status   SARS Coronavirus 2 by RT PCR NEGATIVE NEGATIVE Final    Comment: (NOTE) SARS-CoV-2 target nucleic acids are NOT DETECTED.  The SARS-CoV-2 RNA is generally detectable in upper respiratory specimens during the acute phase of infection. The lowest concentration of SARS-CoV-2 viral copies this  assay can detect is 138 copies/mL. A negative result does not preclude SARS-Cov-2 infection and should not be used as the sole basis for treatment or other patient management decisions. A negative result may occur with  improper specimen collection/handling, submission of specimen other than nasopharyngeal swab, presence of viral mutation(s) within  the areas targeted by this assay, and inadequate number of viral copies(<138 copies/mL). A negative result must be combined with clinical observations, patient history, and epidemiological information. The expected result is Negative.  Fact Sheet for Patients:  EntrepreneurPulse.com.au  Fact Sheet for Healthcare Providers:  IncredibleEmployment.be  This test is no t yet approved or cleared by the [place] FDA and  has been authorized for detection and/or diagnosis of SARS-CoV-2 by FDA under an Emergency Use Authorization (EUA). This EUA will remain  in effect (meaning this test can be used) for the duration of the COVID-19 declaration under Section 564(b)(1) of the Act, 21 U.S.C.section 360bbb-3(b)(1), unless the authorization is terminated  or revoked sooner.       Influenza A by PCR NEGATIVE NEGATIVE Final   Influenza B by PCR NEGATIVE NEGATIVE Final    Comment: (NOTE) The Xpert Xpress SARS-CoV-2/FLU/RSV plus assay is intended as an aid in the diagnosis of influenza from Nasopharyngeal swab specimens and should not be used as a sole basis for treatment. Nasal washings and aspirates are unacceptable for Xpert Xpress SARS-CoV-2/FLU/RSV testing.  Fact Sheet for Patients: EntrepreneurPulse.com.au  Fact Sheet for Healthcare Providers: IncredibleEmployment.be  This test is not yet approved or cleared by the [place] FDA and has been authorized for detection and/or diagnosis of SARS-CoV-2 by FDA under an Emergency Use Authorization (EUA). This EUA will remain in effect (meaning this test can be used) for the duration of the COVID-19 declaration under Section 564(b)(1) of the Act, 21 U.S.C. section 360bbb-3(b)(1), unless the authorization is terminated or revoked.  Performed at [facility], [place] [address], [place], [place] [address]   Blood culture (routine x 2)     Status: None  (Preliminary result)   Collection Time: 11/02/21  9:45 PM   Specimen: BLOOD RIGHT ARM  Result Value Ref Range Status   Specimen Description BLOOD RIGHT ARM  Final   Special Requests   Final    BOTTLES DRAWN AEROBIC AND ANAEROBIC Blood Culture adequate volume   Culture   Final    NO GROWTH < 12 HOURS Performed at [facility], [place] [address] [place], [place] [address]    Report Status PENDING  Incomplete     Radiological Exams on Admission: DG Chest 2 View  Result Date: 11/03/2021 CLINICAL DATA:  Persistent cough EXAM: CHEST - 2 VIEW COMPARISON:  Chest x-ray 07/15/2021 FINDINGS: Heart size is within normal limits. Calcified plaques in the aortic arch. Chronic mildly prominent interstitial lung markings bilaterally. No focal consolidation identified. No pleural effusion or pneumothorax visualized. IMPRESSION: No acute intrathoracic process identified. Electronically Signed   By: Ofilia Neas M.D.   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 Request is for therapeutic paracentesis. 7 L maximum EXAM: ULTRASOUND GUIDED THERAPEUTIC PARACENTESIS MEDICATIONS: Lidocaine 1% 10 mL COMPLICATIONS: None immediate. PROCEDURE: Informed written consent was obtained from the patient after a discussion of the risks, benefits and alternatives to treatment. A timeout was performed prior to the initiation of the procedure. Initial ultrasound scanning demonstrates a large amount of ascites within the right  lower abdominal quadrant. The right lower abdomen was prepped and draped in the usual sterile fashion. 1% lidocaine was used for local anesthesia. Following this, a 19 gauge, 7-cm, Yueh catheter was introduced. An ultrasound image was saved for documentation purposes. The paracentesis was performed. The catheter was removed and a dressing was applied. The patient tolerated the procedure  well without immediate post procedural complication. Patient received post-procedure intravenous albumin; see nursing notes for details. FINDINGS: A total of approximately 6 of straw-colored fluid was removed. IMPRESSION: Successful ultrasound-guided therapeutic paracentesis yielding 6 liters of peritoneal fluid. Read by: Rushie Nyhan, NP Electronically Signed   By: Corrie Mckusick D.O.   On: 11/02/2021 15:37      Assessment/Plan Supected Spontaneous bacterial peritonitis (Acute) Decompensated cirrhosis with ascites (acute on chronic): Patient had paracentesis performed yesterday with removal of 6 L which noted 895 PMN concerning for SBP.  He has been on furosemide 40 mg daily and spironolactone 75 mg daily.  Patient had been ordered albumin and started on Rocephin IV. -Admit to medical telemetry bed -Follow-up blood and paracentesis fluid cultures -Add on INR -Dose adjustment of furosemide and spironolactone per GI recommendation -Continue empiric antibiotics of Rocephin 2 g daily -[facility] consulted, will follow-up for any further recommendations  SIRS/sepsis: Patient was initially noted to be tachycardic and and tachypneic while in the ED. Initial lactic acid was elevated at 2.5, but repeat check within normal limits after initial fluids.  Source for sepsis with the SBP or UTI. -Follow-up cultures  Urinary tract infection: Acute.  Urinalysis noted large leukocytes with rare bacteria, and 21-50 WBCs.  Antibiotics have been given prior to urine cultures being obtained. -Follow-up urine culture -Antibiotics as noted above  Macrocytic anemia: Acute.  On admission hemoglobin 8.8, but had previously been 11.1 on 1/13.  Denies any reports of blood in his stool to his knowledge. -Type and screen for the possible need of blood products  -Continue to monitor H&H -Transfuse blood products   Thrombocytopenia: Chronic.  Platelet count 146, but improved from previous. -Continue to  monitor  Hepatocellular carcinoma: S/p as SBRT, but intra atrial therapy not successful due to occlusive femoral disease.  Noted to have new hepatocellular lesion on 04/2021.  Status post subsequent radiation treatments. -Continue outpatient follow-up with GI  Essential hypertension: Home blood pressure regimen includes Coreg 6.25 mg twice daily, furosemide 40 mg daily, spironolactone 75 mg daily, and Entresto 24-26 mg twice daily -Continue current regimen as tolerated  Chronic diarrhea -Continue to monitor  Diabetes mellitus type 2: On admission glucose elevated up to 194.  Diabetes has been well controlled not on any medications at this time.  Last hemoglobin A1c was 5.2 in 07/2021. -Hypoglycemic protocols -Add on hemoglobin A1c -CBGs before every meal with very sensitive sliding scale insulin -Discontinue CBG checks if blood sugars remain under 180  COPD: Patient without signs of active wheezing on physical exam at this time. -Breathing treatments as needed  Debility: At baseline patient ambulates with the use of a walker and has poor balance. -PT to evaluate and treat  Anxiety: Home medication regimen includes Lexapro 5 mg daily and BuSpar 7.5 mg 2 times daily as needed. -Continue current regimen as  BPH -Continue Proscar and Flomax  GERD -Continue Pepcid  DVT prophylaxis: SCDs  Code Status: Full Family Communication: Daughter updated over the phone Disposition Plan: Possible discharge home once medically stable Consults called: Gastroenterology Admission status: Inpatient  Norval [name] MD [facility]   If  7PM-7AM, please contact night-coverage   11/03/2021, 1:32 PM

## 2021-11-04 ENCOUNTER — Ambulatory Visit: Payer: Medicare HMO | Admitting: Podiatry

## 2021-11-04 ENCOUNTER — Encounter (HOSPITAL_COMMUNITY): Payer: Self-pay | Admitting: Internal Medicine

## 2021-11-04 ENCOUNTER — Inpatient Hospital Stay (HOSPITAL_COMMUNITY): Payer: Medicare HMO

## 2021-11-04 DIAGNOSIS — R188 Other ascites: Secondary | ICD-10-CM

## 2021-11-04 DIAGNOSIS — N390 Urinary tract infection, site not specified: Secondary | ICD-10-CM

## 2021-11-04 DIAGNOSIS — A419 Sepsis, unspecified organism: Secondary | ICD-10-CM

## 2021-11-04 LAB — CBC
HCT: 26.6 % — ABNORMAL LOW (ref 39.0–52.0)
Hemoglobin: 8.8 g/dL — ABNORMAL LOW (ref 13.0–17.0)
MCH: 33.6 pg (ref 26.0–34.0)
MCHC: 33.1 g/dL (ref 30.0–36.0)
MCV: 101.5 fL — ABNORMAL HIGH (ref 80.0–100.0)
Platelets: 119 10*3/uL — ABNORMAL LOW (ref 150–400)
RBC: 2.62 MIL/uL — ABNORMAL LOW (ref 4.22–5.81)
RDW: 17.2 % — ABNORMAL HIGH (ref 11.5–15.5)
WBC: 5.2 10*3/uL (ref 4.0–10.5)
nRBC: 0 % (ref 0.0–0.2)

## 2021-11-04 LAB — BPAM RBC
Blood Product Expiration Date: 202302012359
ISSUE DATE / TIME: 202301251948
Unit Type and Rh: 600

## 2021-11-04 LAB — BASIC METABOLIC PANEL
Anion gap: 7 (ref 5–15)
BUN: 17 mg/dL (ref 8–23)
CO2: 26 mmol/L (ref 22–32)
Calcium: 8.5 mg/dL — ABNORMAL LOW (ref 8.9–10.3)
Chloride: 104 mmol/L (ref 98–111)
Creatinine, Ser: 0.96 mg/dL (ref 0.61–1.24)
GFR, Estimated: >60 mL/min (ref >60)
Glucose, Bld: 103 mg/dL — ABNORMAL HIGH (ref 70–99)
Potassium: 4.3 mmol/L (ref 3.5–5.1)
Sodium: 137 mmol/L (ref 135–145)

## 2021-11-04 LAB — GLUCOSE, CAPILLARY
Glucose-Capillary: 94 mg/dL (ref 70–99)
Glucose-Capillary: 121 mg/dL — ABNORMAL HIGH (ref 70–99)
Glucose-Capillary: 253 mg/dL — ABNORMAL HIGH (ref 70–99)
Glucose-Capillary: 107 mg/dL — ABNORMAL HIGH (ref 70–99)

## 2021-11-04 LAB — HEMOGLOBIN A1C
Hgb A1c MFr Bld: 5.4 % (ref 4.8–5.6)
Mean Plasma Glucose: 108 mg/dL

## 2021-11-04 LAB — TYPE AND SCREEN
ABO/RH(D): A NEG
Antibody Screen: NEGATIVE
Unit division: 0

## 2021-11-04 LAB — HEMOGLOBIN AND HEMATOCRIT, BLOOD
HCT: 28.1 % — ABNORMAL LOW (ref 39.0–52.0)
Hemoglobin: 9 g/dL — ABNORMAL LOW (ref 13.0–17.0)

## 2021-11-04 MED ORDER — ADULT MULTIVITAMIN W/MINERALS CH
1.0000 | ORAL_TABLET | Freq: Every day | ORAL | Status: DC
  Administered 2021-11-04 – 2021-11-07 (×4): 1 via ORAL
  Filled 2021-11-04 (×4): qty 1

## 2021-11-04 MED ORDER — ENSURE ENLIVE PO LIQD
237.0000 mL | Freq: Three times a day (TID) | ORAL | Status: DC
  Administered 2021-11-04 – 2021-11-07 (×7): 237 mL via ORAL

## 2021-11-04 MED ORDER — ALBUMIN HUMAN 25 % IV SOLN
25.0000 g | Freq: Once | INTRAVENOUS | Status: AC
  Administered 2021-11-05: 25 g via INTRAVENOUS
  Filled 2021-11-04: qty 100

## 2021-11-04 NOTE — Assessment & Plan Note (Signed)
Please see above discussion under SBP.

## 2021-11-04 NOTE — Assessment & Plan Note (Signed)
Continue Proscar and Flomax. 

## 2021-11-04 NOTE — ED Notes (Signed)
Pt transferred onto hospital bed. Provided with pillow. Clean sheets. Pt comfortable. Call bell within reach.

## 2021-11-04 NOTE — Progress Notes (Signed)
[facility] Progress Note  CC:  SBP  Subjective:  Says that he feels good, just tired/wiped out.  No much abdominal pain.  Objective:  Vital signs in last 24 hours: Temp:  [98.3 F (36.8 C)-99.3 F (37.4 C)] 98.3 F (36.8 C) (01/26 0913) Pulse Rate:  [95-119] 102 (01/26 0913) Resp:  [18-31] 18 (01/26 0913) BP: (90-132)/(43-104) 102/58 (01/26 0913) SpO2:  [91 %-99 %] 97 % (01/26 0913) Weight:  [81.6 kg] 81.6 kg (01/26 0400) Last BM Date: 11/04/21 General:  Alert, frail, in NAD Heart:  Slightly tach; no murmurs Pulm:  CTAB.  No W/R/R. Abdomen:  Softly distended with ascites fluid.  BS present.  Non-tender. Extremities:  Without edema. Neurologic:  Alert and oriented x 4;  grossly normal neurologically. Psych:  Alert and cooperative. Normal mood and affect.  Intake/Output from previous day: 01/25 0701 - 01/26 0700 In: 2342.9 [I.V.:23.2; Blood:1062.5; IV Piggyback:1257.2] Out: -   Lab Results: Recent Labs    11/02/21 2105 11/03/21 1804 11/04/21 0030 11/04/21 0419  WBC 5.2  --   --  5.2  HGB 8.8* 7.9* 9.0* 8.8*  HCT 27.6* 24.7* 28.1* 26.6*  PLT 146*  --   --  119*   BMET Recent Labs    11/02/21 2105 11/04/21 0419  NA 137 137  K 4.1 4.3  CL 101 104  CO2 28 26  GLUCOSE 194* 103*  BUN 22 17  CREATININE 1.16 0.96  CALCIUM 8.5* 8.5*   LFT Recent Labs    11/02/21 2105  PROT 5.9*  ALBUMIN 2.7*  AST 47*  ALT 25  ALKPHOS 240*  BILITOT 0.9   PT/INR Recent Labs    11/03/21 1804  LABPROT 15.8*  INR 1.3*   DG Chest 2 View  Result Date: 11/03/2021 CLINICAL DATA:  Persistent cough EXAM: CHEST - 2 VIEW COMPARISON:  Chest x-ray 07/15/2021 FINDINGS: Heart size is within normal limits. Calcified plaques in the aortic arch. Chronic mildly prominent interstitial lung markings bilaterally. No focal consolidation identified. No pleural effusion or pneumothorax visualized. IMPRESSION: No acute intrathoracic process identified. Electronically Signed    By: Ofilia Neas M.D.   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 Request is for therapeutic paracentesis. 7 L maximum EXAM: ULTRASOUND GUIDED THERAPEUTIC PARACENTESIS MEDICATIONS: Lidocaine 1% 10 mL COMPLICATIONS: None immediate. PROCEDURE: Informed written consent was obtained from the patient after a discussion of the risks, benefits and alternatives to treatment. A timeout was performed prior to the initiation of the procedure. Initial ultrasound scanning demonstrates a large amount of ascites within the right lower abdominal quadrant. The right lower abdomen was prepped and draped in the usual sterile fashion. 1% lidocaine was used for local anesthesia. Following this, a 19 gauge, 7-cm, Yueh catheter was introduced. An ultrasound image was saved for documentation purposes. The paracentesis was performed. The catheter was removed and a dressing was applied. The patient tolerated the procedure well without immediate post procedural complication. Patient received post-procedure intravenous albumin; see nursing notes for details. FINDINGS: A total of approximately 6 of straw-colored fluid was removed. IMPRESSION: Successful ultrasound-guided therapeutic paracentesis yielding 6 liters of peritoneal fluid. Read by: Rushie Nyhan, NP Electronically Signed   By: Corrie Mckusick D.O.   On: 11/02/2021 15:37    Assessment / Plan:  Decompensated cirrhosis with ascites.  New diagnosis SBP based on this week's fluid studies.  There is a  large reading of leukocytes on urinalysis but rare bacteria and moderate burden of WBCs, ? could this be source for SBP?  Rocephin, albumin initiated.  Unfortunately the ascites is already reaccumulating within 24 hours.  Fortunately renal function is not compromised so there is room to increase spironolactone, furosemide.  Given patient's frailty, not  clear TIPS would be an option.      [place].  See HPI in consult for details.     Anemia.  Current Hgb of 8.8 grams, not out of historic range from 3 and 4 months ago but down 2 g from 1 month ago in late December, early November.     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   Balance compromise, pt reports recent falls without sustaining significant injury or pain.   CHF.  Prior EF as low as 20% but rebounded to 55% at echocardiogram 1 month ago.   -Ultrasound liver with doppler ordered. -Continue Rocephin. -Can resume and attempt to up-titrate diuretics but would wait until SBP treated further. -Needs 2 grams sodium diet. -Recommend palliative care consult. -I have ordered another dose of albumin 25 grams for tomorrow.   LOS: 1 day   [name]. [place] Filice  11/04/2021, 10:58 AM

## 2021-11-04 NOTE — Progress Notes (Signed)
Physical Therapy Discharge Patient Details Name: [name] MRN: [id number] DOB: 09/07/1941 Today's Date: 11/04/2021 Time: 9935-7017 PT Time Calculation (min) (ACUTE ONLY): 8 min  Patient discharged from PT services secondary to goals met and no further PT needs identified.  Please see latest therapy progress note for current level of functioning and progress toward goals.    Progress and discharge plan discussed with patient and/or caregiver: Patient/Caregiver agrees with plan  GP    [name] A. Lakishia Bourassa, PT, DPT Acute Rehabilitation Services Office: [place] 11/04/2021, 11:20 AM

## 2021-11-04 NOTE — Assessment & Plan Note (Signed)
-   Decompensated liver failure, Lasix and Aldactone management per GI  -Follow-up with ultrasound liver with Doppler -Continue with 2 g sodium diet -We will give albumin 25 g for tomorrow.

## 2021-11-04 NOTE — Progress Notes (Signed)
Initial Nutrition Assessment  DOCUMENTATION CODES:  Severe malnutrition in context of chronic illness  INTERVENTION:  Continue current diet as ordered. Monitor intake for the need for liberalization Ensure Enlive po TID, each supplement provides 350 kcal and 20 grams of protein MVI with minerals daily Monitor GOC discussion  NUTRITION DIAGNOSIS:  Severe Malnutrition (in the context of chronic illness) related to decreased appetite, early satiety as evidenced by severe muscle depletion, severe fat depletion, percent weight loss (12.3% x 3 months).  GOAL:  Patient will meet greater than or equal to 90% of their needs  MONITOR:  PO intake, Labs, Supplement acceptance, Weight trends  REASON FOR ASSESSMENT:  Malnutrition Screening Tool    ASSESSMENT:  81 year old male with a history of DM type II, HLD, HTN, COPD, CKD3, CHF, hepatocellular carcinoma, cirrhosis, esophageal varices presented to the ER with concern for SBP   Pt resting in bed at the time of assessment with several family members present at bedside. Pt endorses poor appetite for months and weight loss. Pt reports that at home he is typically only eating 1x/d (hamburger, hotdog, sandwich, etc) but will only eat a small amount. States that with his ascites, eating more than about half his meal causes discomfort. Has been doing relatively well with fluids and reports that he drinks ~2 ensures each day. Prefers chocolate.   Reviewed weight hx, severe loss of 12.3% noted over the last 3 months (10/27-1/26). Significant muscle and fat wasting present on exam. Pt does has recurring ascites (6L removed the day PTA and abdomen was distended on exam).   Noted that palliative care consult is pending to discuss [place]. Will add nutrition supplements and monitor GOC discussion for the need for further interventions.  Nutritionally Relevant Medications: Scheduled Meds:  famotidine  20 mg Oral BID   feeding supplement  237 mL Oral TID BM    furosemide  40 mg Oral Daily   insulin aspart  0-6 Units Subcutaneous TID WC   multivitamin with minerals  1 tablet Oral Daily   rosuvastatin  10 mg Oral Daily   spironolactone  75 mg Oral Daily   Continuous Infusions:  [START ON 11/05/2021] albumin human     PRN Meds: loperamide, ondansetron  Labs Reviewed: Lab Results  Component Value Date   ALT 25 11/02/2021   AST 47 (H) 11/02/2021   ALKPHOS 240 (H) 11/02/2021   BILITOT 0.9 11/02/2021   NUTRITION - FOCUSED PHYSICAL EXAM: Flowsheet Row Most Recent Value  Orbital Region Severe depletion  Upper Arm Region Severe depletion  Thoracic and Lumbar Region Severe depletion  Buccal Region Severe depletion  Temple Region Mild depletion  Clavicle Bone Region Severe depletion  Clavicle and Acromion Bone Region Severe depletion  Scapular Bone Region Severe depletion  Dorsal Hand Severe depletion  Patellar Region Severe depletion  Anterior Thigh Region Severe depletion  Posterior Calf Region Moderate depletion  Edema (RD Assessment) --  [significant ascites present]  Hair Reviewed  Eyes Reviewed  Mouth Reviewed  Skin Reviewed  [scattered bruising/fragile skin]  Nails Reviewed   Diet Order:   Diet Order             Diet 2 gram sodium Room service appropriate? Yes; Fluid consistency: Thin  Diet effective now                   EDUCATION NEEDS:  Education needs have been addressed  Skin:  Skin Assessment: Reviewed RN Assessment (ecchymosis and excoriation)  Last BM:  1/26,  type 2  Height:  Ht Readings from Last 1 Encounters:  11/02/21 5\' 11"  (1.803 m)    Weight:  Wt Readings from Last 1 Encounters:  11/04/21 81.6 kg    Ideal Body Weight:  78.2 kg  BMI:  Body mass index is 25.09 kg/m.  Estimated Nutritional Needs:  Kcal:  2000-2200 kcal/d Protein:  100-120 g/d Fluid:  2L/d    Ranell Patrick, RD, LDN Clinical Dietitian RD pager # available in [place]  After hours/weekend pager # available in [facility]

## 2021-11-04 NOTE — Assessment & Plan Note (Signed)
Due to liver cirrhosis  

## 2021-11-04 NOTE — ED Notes (Signed)
Hospital bed ordered for pt comfort. Pt pending bed assignment

## 2021-11-04 NOTE — Assessment & Plan Note (Addendum)
Urinalysis noted large leukocytes with rare bacteria, and 21-50 WBCs.  Antibiotics have been given prior to urine cultures being obtained. -Urine culture growing E. coli and Pseudomonas. -Antibiotics as noted above

## 2021-11-04 NOTE — Evaluation (Signed)
Physical Therapy Evaluation Patient Details Name: [name] MRN: [id number] DOB: 12-29-1940 Today's Date: 11/04/2021  History of Present Illness  Pt. is 81 yr old M admitted on 11/02/21 after results of OP paracentesis showed SBP.  PMH: BPH, CHF, COPD, depression, HTN, liver CA, DM, thrombocytopenia.  Clinical Impression  Pt is mod I at baseline with use of RW and remains mod I at this time for transfers and short distance amb.  Pt is a household amb at baseline due to ulcer on his foot which causes him pain.  Pt demos all activities safely and without assist.  Is not appropriate for skilled PT in acute care, but should continue to mobilize with mobility team (pt is already on their caseload per medical record).  Please reconsult if pt should have a change in status.  Safe to return home.       Recommendations for follow up therapy are one component of a multi-disciplinary discharge planning process, led by the attending physician.  Recommendations may be updated based on patient status, additional functional criteria and insurance authorization.  Follow Up Recommendations No PT follow up (Pt. to continue with mobility team)    Assistance Recommended at Discharge PRN  Patient can return home with the following       Equipment Recommendations    Recommendations for Other Services       Functional Status Assessment Patient has not had a recent decline in their functional status     Precautions / Restrictions Precautions Precautions: None      Mobility  Bed Mobility Overal bed mobility: Modified Independent                  Transfers Overall transfer level: Modified independent Equipment used: Rolling walker (2 wheels)               General transfer comment: Demos good safety awareness with sit > stand and good eccentric control without cueing from PT    Ambulation/Gait Ambulation/Gait assistance: Modified independent (Device/Increase time) Gait Distance  (Feet): 20 Feet Assistive device: Rolling walker (2 wheels) Gait Pattern/deviations: WFL(Within Functional Limits)       General Gait Details: Amb mod I in room.  Distance is limited but states that's all he typically does at home.  No LOB  Stairs            Wheelchair Mobility    Modified Rankin (Stroke Patients Only)       Balance Overall balance assessment: Modified Independent                                           Pertinent Vitals/Pain Pain Assessment Pain Assessment: Faces Pain Score: 1  Faces Pain Scale: Hurts a little bit Pain Location: States he "just has his normal aches and pains".  Does not speficy specific location. Pain Descriptors / Indicators: Discomfort Pain Intervention(s): Limited activity within patient's tolerance    Home Living Family/patient expects to be discharged to:: Private residence Living Arrangements: Spouse/significant other (Spouse has mild dementia per patient) Available Help at Discharge: Family;Available PRN/intermittently (States him and his wife help eachother and he has 2 daughters who live closeby and help as needed) Type of Home: House Home Access: Ramped entrance       Home Layout: One level Home Equipment: Conservation officer, nature (2 wheels)      Prior Function Prior Level of  Function : Independent/Modified Independent                     Hand Dominance        Extremity/Trunk Assessment   Upper Extremity Assessment Upper Extremity Assessment: Overall WFL for tasks assessed    Lower Extremity Assessment Lower Extremity Assessment: Generalized weakness       Communication   Communication: No difficulties  Cognition Arousal/Alertness: Lethargic Behavior During Therapy: WFL for tasks assessed/performed Overall Cognitive Status: Within Functional Limits for tasks assessed                                 General Comments: Pt. is sleeping when PT arrives but arouses easily.   States he cannot walk long distance due to an ulcer on his foot that is painful, but does amb short distances around home.  Agreeable to walk a short distance in room with PT.        General Comments      Exercises     Assessment/Plan    PT Assessment Patient does not need any further PT services  PT Problem List         PT Treatment Interventions      PT Goals (Current goals can be found in the Care Plan section)  Acute Rehab PT Goals Patient Stated Goal: Pt's goal is to return home PT Goal Formulation: With patient Time For Goal Achievement: 11/18/21 Potential to Achieve Goals: Good    Frequency       Co-evaluation               AM-PAC PT "6 Clicks" Mobility  Outcome Measure Help needed turning from your back to your side while in a flat bed without using bedrails?: None Help needed moving from lying on your back to sitting on the side of a flat bed without using bedrails?: A Little Help needed moving to and from a bed to a chair (including a wheelchair)?: None Help needed standing up from a chair using your arms (e.g., wheelchair or bedside chair)?: None Help needed to walk in hospital room?: None Help needed climbing 3-5 steps with a railing? : A Little 6 Click Score: 22    End of Session Equipment Utilized During Treatment: Gait belt Activity Tolerance: Patient tolerated treatment well Patient left: in bed;with bed alarm set;with call bell/phone within reach (Offered to leave pt in chair but pt wanted to get BTB  to continue sleeping) Nurse Communication: Mobility status PT Visit Diagnosis: Muscle weakness (generalized) (M62.81)    Time: 8119-1478 PT Time Calculation (min) (ACUTE ONLY): 8 min   Charges:   PT Evaluation $PT Eval Low Complexity: 1 Low          Kemon Devincenzi A. Chanti Golubski, PT, DPT Acute Rehabilitation Services Office: [place] 11/04/2021, 11:17 AM

## 2021-11-04 NOTE — ED Notes (Signed)
ED TO INPATIENT HANDOFF REPORT  ED Nurse Name and Phone #: Mel Almond [phone]  Q Name/Age/Gender [name] 81 y.o. male Room/Bed: 045C/045C  Code Status   Code Status: Full Code  Home/SNF/Other Home Patient oriented to: self, place, time, and situation Is this baseline? Yes   Triage Complete: Triage complete  Chief Complaint Spontaneous bacterial peritonitis ([place]) [K65.2]  Triage Note Patient advised by his PCP to go to ER due to abnormal blood tests results taken yesterday " my blood is infected" , denies fever or chills .    Allergies No Known Allergies  Level of Care/Admitting Diagnosis ED Disposition     ED Disposition  Admit   Condition  --   Comment  Hospital Area: [facility] [[id number]]  Level of Care: Telemetry Medical [[id number]]  May admit patient to [facility] or [facility] if equivalent level of care is available:: No  Covid Evaluation: Confirmed COVID Negative  Diagnosis: Spontaneous bacterial peritonitis ([place]) [759.16.ICD-9-CM]  Admitting Physician: Norval [name] [[id number]]  Attending Physician: Norval [name] [[id number]]  Estimated length of stay: past midnight tomorrow  Certification:: I certify this patient will need inpatient services for at least 2 midnights          B Medical/Surgery History Past Medical History:  Diagnosis Date   Benign prostatic hypertrophy    Blood transfusion without reported diagnosis    CHF (congestive heart failure) ([place])    a. EF 20-25% by echo in 11/2019   COPD (chronic obstructive pulmonary disease) ([place]) 12/15/2009   FEV1 2.30 (70%) ratio 63 no better with B2 and DLCO 18/6 (73%) corrects to 106%   Degenerative joint disease    Depression    Diverticulosis    Esophageal varices ([place])    Essential hypertension    ACE inhibitor cough   Fatty liver    Hepatocellular carcinoma (HCC)    Hyperlipidemia    Internal hemorrhoids    Liver cancer ([place])    Low back pain    Otitis externa 07/30/2012    Portal hypertensive gastropathy (HCC)    Splenomegaly    Thrombocytopenia (HCC)    Type 2 diabetes mellitus ([place])    Ulcer of foot ([place])    Right foot   Past Surgical History:  Procedure Laterality Date   BIOPSY  07/14/2021   Procedure: BIOPSY;  Surgeon: Daryel November, MD;  Location: [facility];  Service: Gastroenterology;;  EGD and COLON   COLONOSCOPY  2020   JMP-MAC-plenvu (good)-polyps-recall 1 yr   COLONOSCOPY WITH PROPOFOL N/A 07/14/2021   Procedure: COLONOSCOPY WITH PROPOFOL;  Surgeon: Daryel November, MD;  Location: [facility] ENDOSCOPY;  Service: Gastroenterology;  Laterality: N/A;   ESOPHAGOGASTRODUODENOSCOPY (EGD) WITH PROPOFOL N/A 07/14/2021   Procedure: ESOPHAGOGASTRODUODENOSCOPY (EGD) WITH PROPOFOL;  Surgeon: Daryel November, MD;  Location: [place] [facility];  Service: Gastroenterology;  Laterality: N/A;   IR EMBO TUMOR ORGAN ISCHEMIA INFARCT INC GUIDE ROADMAPPING  06/01/2020   IR PARACENTESIS  08/30/2021   IR PARACENTESIS  09/22/2021   IR PARACENTESIS  10/12/2021   IR PARACENTESIS  11/02/2021   IR RADIOLOGIST EVAL & MGMT  04/28/2020   IR RADIOLOGIST EVAL & MGMT  07/15/2020   IR RADIOLOGIST EVAL & MGMT  05/04/2021   NO PAST SURGERIES  1980     A IV Location/Drains/Wounds Patient Lines/Drains/Airways Status     Active Line/Drains/Airways     Name Placement date Placement time Site Days   Peripheral IV 11/03/21 20 G Anterior;Left Forearm 11/03/21  1902  Forearm  1   Peripheral IV 11/04/21 20 G 1" Left Antecubital 11/04/21  0028  Antecubital  less than 1            Intake/Output Last 24 hours  Intake/Output Summary (Last 24 hours) at 11/04/2021 3149 Last data filed at 11/03/2021 2219 Gross per 24 hour  Intake 2342.86 ml  Output --  Net 2342.86 ml    Labs/Imaging Results for orders placed or performed during the hospital encounter of 11/02/21 (from the past 48 hour(s))  Blood culture (routine x 2)     Status: None (Preliminary result)   Collection Time:  11/02/21  9:00 PM   Specimen: BLOOD LEFT ARM  Result Value Ref Range   Specimen Description BLOOD LEFT ARM    Special Requests      BOTTLES DRAWN AEROBIC ONLY Blood Culture adequate volume   Culture      NO GROWTH < 12 HOURS Performed at [facility], [place] [address] [place], [place] [address]    Report Status PENDING   CBC with Differential     Status: Abnormal   Collection Time: 11/02/21  9:05 PM  Result Value Ref Range   WBC 5.2 4.0 - 10.5 K/uL   RBC 2.59 (L) 4.22 - 5.81 MIL/uL   Hemoglobin 8.8 (L) 13.0 - 17.0 g/dL   HCT 27.6 (L) 39.0 - 52.0 %   MCV 106.6 (H) 80.0 - 100.0 fL   MCH 34.0 26.0 - 34.0 pg   MCHC 31.9 30.0 - 36.0 g/dL   RDW 16.4 (H) 11.5 - 15.5 %   Platelets 146 (L) 150 - 400 K/uL   nRBC 0.0 0.0 - 0.2 %   Neutrophils Relative % 82 %   Neutro Abs 4.2 1.7 - 7.7 K/uL   Lymphocytes Relative 9 %   Lymphs Abs 0.5 (L) 0.7 - 4.0 K/uL   Monocytes Relative 8 %   Monocytes Absolute 0.4 0.1 - 1.0 K/uL   Eosinophils Relative 1 %   Eosinophils Absolute 0.0 0.0 - 0.5 K/uL   Basophils Relative 0 %   Basophils Absolute 0.0 0.0 - 0.1 K/uL   Immature Granulocytes 0 %   Abs Immature Granulocytes 0.01 0.00 - 0.07 K/uL    Comment: Performed at [facility], [place] [address] [place], [place] [address]  Comprehensive metabolic panel     Status: Abnormal   Collection Time: 11/02/21  9:05 PM  Result Value Ref Range   Sodium 137 135 - 145 mmol/L   Potassium 4.1 3.5 - 5.1 mmol/L   Chloride 101 98 - 111 mmol/L   CO2 28 22 - 32 mmol/L   Glucose, Bld 194 (H) 70 - 99 mg/dL    Comment: Glucose reference range applies only to samples taken after fasting for at least 8 hours.   BUN 22 8 - 23 mg/dL   Creatinine, Ser 1.16 0.61 - 1.24 mg/dL   Calcium 8.5 (L) 8.9 - 10.3 mg/dL   Total Protein 5.9 (L) 6.5 - 8.1 g/dL   Albumin 2.7 (L) 3.5 - 5.0 g/dL   AST 47 (H) 15 - 41 U/L   ALT 25 0 - 44 U/L   Alkaline Phosphatase 240 (H) 38 - 126 U/L   Total Bilirubin 0.9 0.3 - 1.2 mg/dL    GFR, Estimated >60 >60 mL/min    Comment: (NOTE) Calculated using the CKD-EPI Creatinine Equation (2021)    Anion gap 8 5 - 15    Comment: Performed  at [facility], [place] [address] [place], [place] [address]  Lipase, blood     Status: Abnormal   Collection Time: 11/02/21  9:05 PM  Result Value Ref Range   Lipase 55 (H) 11 - 51 U/L    Comment: Performed at [facility] [address], [place], [place] [address]  Lactic acid, plasma     Status: Abnormal   Collection Time: 11/02/21  9:05 PM  Result Value Ref Range   Lactic Acid, Venous 2.5 (HH) 0.5 - 1.9 mmol/L    Comment: CRITICAL RESULT CALLED TO, READ BACK BY AND VERIFIED WITH: Burnice Logan RN 11/02/21 2248 Wiliam Ke Performed at [facility], [place] [address], [place], [place] [address]   Urinalysis, Routine w reflex microscopic Urine, Clean Catch     Status: Abnormal   Collection Time: 11/02/21  9:25 PM  Result Value Ref Range   Color, Urine YELLOW YELLOW   APPearance CLEAR CLEAR   Specific Gravity, Urine 1.026 1.005 - 1.030   pH 5.0 5.0 - 8.0   Glucose, UA NEGATIVE NEGATIVE mg/dL   Hgb urine dipstick NEGATIVE NEGATIVE   Bilirubin Urine NEGATIVE NEGATIVE   Ketones, ur NEGATIVE NEGATIVE mg/dL   Protein, ur NEGATIVE NEGATIVE mg/dL   Nitrite NEGATIVE NEGATIVE   Leukocytes,Ua LARGE (A) NEGATIVE   RBC / HPF 0-5 0 - 5 RBC/hpf   WBC, UA 21-50 0 - 5 WBC/hpf   Bacteria, UA RARE (A) NONE SEEN   Squamous Epithelial / LPF 0-5 0 - 5   Mucus PRESENT    Hyaline Casts, UA PRESENT     Comment: Performed at [facility], [address] [place], [place] [address]  Resp Panel by RT-PCR (Flu A&B, Covid) Peripheral     Status: None   Collection Time: 11/02/21  9:35 PM   Specimen: Peripheral; Nasopharyngeal(NP) swabs in vial transport medium  Result Value Ref Range   SARS Coronavirus 2 by RT PCR NEGATIVE NEGATIVE    Comment: (NOTE) SARS-CoV-2 target nucleic acids are NOT DETECTED.  The SARS-CoV-2 RNA is  generally detectable in upper respiratory specimens during the acute phase of infection. The lowest concentration of SARS-CoV-2 viral copies this assay can detect is 138 copies/mL. A negative result does not preclude SARS-Cov-2 infection and should not be used as the sole basis for treatment or other patient management decisions. A negative result may occur with  improper specimen collection/handling, submission of specimen other than nasopharyngeal swab, presence of viral mutation(s) within the areas targeted by this assay, and inadequate number of viral copies(<138 copies/mL). A negative result must be combined with clinical observations, patient history, and epidemiological information. The expected result is Negative.  Fact Sheet for Patients:  EntrepreneurPulse.com.au  Fact Sheet for Healthcare Providers:  IncredibleEmployment.be  This test is no t yet approved or cleared by the [place] FDA and  has been authorized for detection and/or diagnosis of SARS-CoV-2 by FDA under an Emergency Use Authorization (EUA). This EUA will remain  in effect (meaning this test can be used) for the duration of the COVID-19 declaration under Section 564(b)(1) of the Act, 21 U.S.C.section 360bbb-3(b)(1), unless the authorization is terminated  or revoked sooner.       Influenza A by PCR NEGATIVE NEGATIVE   Influenza B by PCR NEGATIVE NEGATIVE    Comment: (NOTE) The Xpert Xpress SARS-CoV-2/FLU/RSV plus assay is intended as an aid in the diagnosis of influenza from Nasopharyngeal swab specimens and should not  be used as a sole basis for treatment. Nasal washings and aspirates are unacceptable for Xpert Xpress SARS-CoV-2/FLU/RSV testing.  Fact Sheet for Patients: EntrepreneurPulse.com.au  Fact Sheet for Healthcare Providers: IncredibleEmployment.be  This test is not yet approved or cleared by the [place] FDA  and has been authorized for detection and/or diagnosis of SARS-CoV-2 by FDA under an Emergency Use Authorization (EUA). This EUA will remain in effect (meaning this test can be used) for the duration of the COVID-19 declaration under Section 564(b)(1) of the Act, 21 U.S.C. section 360bbb-3(b)(1), unless the authorization is terminated or revoked.  Performed at [facility], [place] [address], [place], [place] [address]   Blood culture (routine x 2)     Status: None (Preliminary result)   Collection Time: 11/02/21  9:45 PM   Specimen: BLOOD RIGHT ARM  Result Value Ref Range   Specimen Description BLOOD RIGHT ARM    Special Requests      BOTTLES DRAWN AEROBIC AND ANAEROBIC Blood Culture adequate volume   Culture      NO GROWTH < 12 HOURS Performed at [facility], [place] [address] [place], [place] [address]    Report Status PENDING   Lactic acid, plasma     Status: None   Collection Time: 11/03/21 12:45 PM  Result Value Ref Range   Lactic Acid, Venous 1.3 0.5 - 1.9 mmol/L    Comment: Performed at [facility], [place] [address] [place], [place] [address]  CBG monitoring, ED     Status: Abnormal   Collection Time: 11/03/21  5:17 PM  Result Value Ref Range   Glucose-Capillary 112 (H) 70 - 99 mg/dL    Comment: Glucose reference range applies only to samples taken after fasting for at least 8 hours.  Lactic acid, plasma     Status: None   Collection Time: 11/03/21  6:04 PM  Result Value Ref Range   Lactic Acid, Venous 1.3 0.5 - 1.9 mmol/L    Comment: Performed at [facility], [place] [address] [place], [place] [address]  Hemoglobin and hematocrit, blood     Status: Abnormal   Collection Time: 11/03/21  6:04 PM  Result Value Ref Range   Hemoglobin 7.9 (L) 13.0 - 17.0 g/dL   HCT 24.7 (L) 39.0 - 52.0 %    Comment: Performed at [facility] [address], [place], [place] [address]  Type and screen [place]     Status: None (Preliminary  result)   Collection Time: 11/03/21  6:04 PM  Result Value Ref Range   ABO/RH(D) A NEG    Antibody Screen NEG    Sample Expiration 11/06/2021,2359    Unit Number [id number]    Blood Component Type RED CELLS,LR    Unit division 00    Status of Unit ISSUED    Transfusion Status OK TO TRANSFUSE    Crossmatch Result      Compatible Performed at [facility], [place] [address], [place], [place] [address]   Protime-INR     Status: Abnormal   Collection Time: 11/03/21  6:04 PM  Result Value Ref Range   Prothrombin Time 15.8 (H) 11.4 - 15.2 seconds   INR 1.3 (H) 0.8 - 1.2    Comment: (NOTE) INR goal varies based on device and disease states. Performed at [facility], [place] [address], [place], [place] [address]   Hemoglobin A1c     Status: None  Collection Time: 11/03/21  6:04 PM  Result Value Ref Range   Hgb A1c MFr Bld 5.4 4.8 - 5.6 %    Comment: (NOTE)         Prediabetes: 5.7 - 6.4         Diabetes: >6.4         Glycemic control for adults with diabetes: <7.0    Mean Plasma Glucose 108 mg/dL    Comment: (NOTE) Performed At: [facility] [place], [place] [id number] Rush Farmer MD WU:[id number]   Prepare RBC (crossmatch)     Status: None   Collection Time: 11/03/21  7:07 PM  Result Value Ref Range   Order Confirmation      ORDER PROCESSED BY BLOOD BANK Performed at [facility], [place] [address] [place], [place] [address]   POC occult blood, ED     Status: None   Collection Time: 11/03/21  7:41 PM  Result Value Ref Range   Fecal Occult Bld NEGATIVE NEGATIVE  CBG monitoring, ED     Status: Abnormal   Collection Time: 11/03/21 11:22 PM  Result Value Ref Range   Glucose-Capillary 124 (H) 70 - 99 mg/dL    Comment: Glucose reference range applies only to samples taken after fasting for at least 8 hours.  Hemoglobin and hematocrit, blood     Status: Abnormal   Collection Time: 11/04/21 12:30 AM  Result Value Ref Range    Hemoglobin 9.0 (L) 13.0 - 17.0 g/dL   HCT 28.1 (L) 39.0 - 52.0 %    Comment: Performed at [facility] [address] [place], [place] [address]   DG Chest 2 View  Result Date: 11/03/2021 CLINICAL DATA:  Persistent cough EXAM: CHEST - 2 VIEW COMPARISON:  Chest x-ray 07/15/2021 FINDINGS: Heart size is within normal limits. Calcified plaques in the aortic arch. Chronic mildly prominent interstitial lung markings bilaterally. No focal consolidation identified. No pleural effusion or pneumothorax visualized. IMPRESSION: No acute intrathoracic process identified. Electronically Signed   By: Ofilia Neas M.D.   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 Request is for therapeutic paracentesis. 7 L maximum EXAM: ULTRASOUND GUIDED THERAPEUTIC PARACENTESIS MEDICATIONS: Lidocaine 1% 10 mL COMPLICATIONS: None immediate. PROCEDURE: Informed written consent was obtained from the patient after a discussion of the risks, benefits and alternatives to treatment. A timeout was performed prior to the initiation of the procedure. Initial ultrasound scanning demonstrates a large amount of ascites within the right lower abdominal quadrant. The right lower abdomen was prepped and draped in the usual sterile fashion. 1% lidocaine was used for local anesthesia. Following this, a 19 gauge, 7-cm, Yueh catheter was introduced. An ultrasound image was saved for documentation purposes. The paracentesis was performed. The catheter was removed and a dressing was applied. The patient tolerated the procedure well without immediate post procedural complication. Patient received post-procedure intravenous albumin; see nursing notes for details. FINDINGS: A total of approximately 6 of straw-colored fluid was removed. IMPRESSION: Successful ultrasound-guided therapeutic paracentesis yielding 6 liters of  peritoneal fluid. Read by: Rushie Nyhan, NP Electronically Signed   By: Corrie Mckusick D.O.   On: 11/02/2021 15:37    Pending Labs Unresulted Labs (From admission, onward)     Start     Ordered   11/04/21 0500  CBC  Tomorrow morning,   R        11/03/21  1419   11/04/21 2992  Basic metabolic panel  Tomorrow morning,   R        11/03/21 1419   11/03/21 1914  Occult blood card to lab, stool RN will collect  ONCE - STAT,   STAT       Question:  Specimen to be collected by:  Answer:  RN will collect   11/03/21 1913   11/03/21 1111  Urine Culture  Once,   STAT       Question:  Indication  Answer:  Dysuria   11/03/21 1110   11/02/21 2026  Lactic acid, plasma  Now then every 2 hours,   STAT      11/02/21 2025            Vitals/Pain Today's Vitals   11/03/21 2345 11/04/21 0000 11/04/21 0100 11/04/21 0200  BP: 109/67 109/64 (!) 102/54 (!) 93/47  Pulse: 97 100 100 97  Resp: (!) 22 (!) 26 (!) 28 (!) 22  Temp:      TempSrc:      SpO2: 94% 95% 92% 91%  Weight:      Height:      PainSc:        Isolation Precautions No active isolations  Medications Medications  carvedilol (COREG) tablet 6.25 mg (6.25 mg Oral Given 11/03/21 1709)  busPIRone (BUSPAR) tablet 7.5 mg (7.5 mg Oral Given 11/03/21 2212)  escitalopram (LEXAPRO) tablet 5 mg (5 mg Oral Given 11/03/21 1709)  famotidine (PEPCID) tablet 20 mg (20 mg Oral Given 11/03/21 2212)  finasteride (PROSCAR) tablet 5 mg (5 mg Oral Given 11/03/21 1708)  tamsulosin (FLOMAX) capsule 0.4 mg (0.4 mg Oral Given 11/03/21 1710)  gabapentin (NEURONTIN) capsule 300 mg (300 mg Oral Given 11/03/21 2211)  loperamide (IMODIUM) capsule 2 mg (has no administration in time range)  spironolactone (ALDACTONE) tablet 75 mg (75 mg Oral Given 11/03/21 2011)  furosemide (LASIX) tablet 40 mg (40 mg Oral Given 11/03/21 1711)  rosuvastatin (CRESTOR) tablet 10 mg (10 mg Oral Given 11/03/21 1711)  sacubitril-valsartan (ENTRESTO) 24-26 mg per tablet (1 tablet Oral  Given 11/03/21 2211)  sodium chloride flush (NS) 0.9 % injection 3 mL (3 mLs Intravenous Given 11/03/21 2213)  acetaminophen (TYLENOL) tablet 650 mg (has no administration in time range)    Or  acetaminophen (TYLENOL) suppository 650 mg (has no administration in time range)  ondansetron (ZOFRAN) tablet 4 mg (has no administration in time range)    Or  ondansetron (ZOFRAN) injection 4 mg (has no administration in time range)  albuterol (PROVENTIL) (2.5 MG/3ML) 0.083% nebulizer solution 2.5 mg (has no administration in time range)  cefTRIAXone (ROCEPHIN) 2 g in sodium chloride 0.9 % 100 mL IVPB (has no administration in time range)  insulin aspart (novoLOG) injection 0-6 Units (0 Units Subcutaneous Not Given 11/03/21 1718)  polyvinyl alcohol (LIQUIFILM TEARS) 1.4 % ophthalmic solution 1 drop (1 drop Both Eyes Given 11/03/21 2213)  cefTRIAXone (ROCEPHIN) 2 g in sodium chloride 0.9 % 100 mL IVPB (0 g Intravenous Stopped 11/03/21 1333)  albumin human 25 % solution 50 g (0 g Intravenous Stopped 11/03/21 1810)  sodium chloride 0.9 % bolus 1,000 mL (0 mLs Intravenous Stopped 11/03/21 1419)  0.9 %  sodium chloride infusion (Manually program via Guardrails IV Fluids) (0 mLs Intravenous Stopped 11/03/21 2219)    Mobility walks with device Moderate fall risk   Focused Assessments     R Recommendations: See Admitting Provider Note  Report given to:   Additional Notes:

## 2021-11-04 NOTE — Assessment & Plan Note (Addendum)
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  -Recommendation per GI to continue with IV Rocephin x5 days, then to switch to Septra DS.  For prophylaxis.  -Body fluid culture growing Clostridium perfringens -Dose adjustment of furosemide and spironolactone per GI recommendation -Cytology with no evidence of any atypical cells, only inflammatory cells

## 2021-11-04 NOTE — Assessment & Plan Note (Signed)
Around baseline

## 2021-11-04 NOTE — Progress Notes (Signed)
PROGRESS NOTE                                                                             PROGRESS NOTE                                                                                                                                                                                                             Patient Demographics:    [name], is a 81 y.o. male, DOB - [birth year], FUX:[id number]  Outpatient Primary MD for the patient is [name], MD    LOS - 1  Admit date - 11/02/2021    Chief Complaint  Patient presents with   Abnormal Blood Test Results       Brief Narrative    [name] is a 81 y.o. male with medical history significant of diastolic CHF, diabetes mellitus type 2, NASH related cirrhosis with ascites, splenomegaly, thrombocytopenia, hepatocellular carcinoma s/p SBRT presents after being advised to come to the hospital for abnormal test results.  He had paracentesis day prior to admission with 6 L of fluid removed given 50 g of albumin x1 post paracentesis.  Cell count showed PMNs of 895 consistent with spontaneous bacterial peritonitis, so he was admitted for further work-up.   Subjective:    [name] today denies any headache, fever, chills, abdominal pain nausea or vomiting.   Assessment  & Plan :    * Sepsis due to Spontaneous bacterial peritonitis ([facility])- (present on admission) Decompensated cirrhosis with ascites (acute on chronic): -She is with multiple paracentesis in the past, most recent 2 days prior to admission, -Continue fluid significant for 895 PMN concerning for SBP.  -Continue with IV Rocephin, follow on gram stain and cultures, current recommendation by GI to complete 5 days of antibiotic for SBP followed by Septra DS 1 tablet daily long-term for prophylaxis. -Follow-up blood and paracentesis fluid cultures -Dose adjustment of furosemide and spironolactone per GI recommendation    Acute lower  UTI Urinalysis noted large leukocytes with rare bacteria,  and 21-50 WBCs.  Antibiotics have been given prior to urine cultures being obtained. -Follow-up urine culture -Antibiotics as noted above  Cirrhosis of liver with ascites ([place])- (present on admission) - Decompensated liver failure, Lasix and Aldactone management per GI  -Follow-up with ultrasound liver with Doppler -Continue with 2 g sodium diet -We will give albumin 25 g for tomorrow.  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  Last hemoglobin A1c was 5.2 in 07/2021. -Hypoglycemic protocols -Add on hemoglobin A1c -CBGs before every meal with very sensitive sliding scale insulin -Discontinue CBG checks if blood sugars remain under 180  Essential hypertension- (present on admission) Home blood pressure regimen includes Coreg 6.25 mg twice daily, furosemide 40 mg daily, spironolactone 75 mg daily, and Entresto 24-26 mg twice daily -Continue current regimen as tolerated  Iron deficiency anemia due to chronic blood loss- (present on admission) Around baseline  BPH (benign prostatic hyperplasia)- (present on admission) Continue Proscar and Flomax  Sepsis ([place]) Please see above discussion under SBP.          Family Communication  : At bedside full code  Code Status :  Full  Consults  :  GI, Palliative  Disposition Plan  :home  Status is: Inpatient  Remains inpatient appropriate because: IV antibiotics      DVT Prophylaxis  :   SCDs   Lab Results  Component Value Date   PLT 119 (L) 11/04/2021    Diet :  Diet Order             Diet 2 gram sodium Room service appropriate? Yes; Fluid consistency: Thin  Diet effective now                    Inpatient Medications  Scheduled Meds:  busPIRone  7.5  mg Oral QHS   carvedilol  6.25 mg Oral BID WC   escitalopram  5 mg Oral Daily   famotidine  20 mg Oral BID   feeding supplement  237 mL Oral TID BM   finasteride  5 mg Oral Daily   furosemide  40 mg Oral Daily   gabapentin  300 mg Oral BID   insulin aspart  0-6 Units Subcutaneous TID WC   multivitamin with minerals  1 tablet Oral Daily   polyvinyl alcohol  1 drop Both Eyes QHS   rosuvastatin  10 mg Oral Daily   sacubitril-valsartan  1 tablet Oral BID   sodium chloride flush  3 mL Intravenous Q12H   spironolactone  75 mg Oral Daily   tamsulosin  0.4 mg Oral Daily   Continuous Infusions:  [START ON 11/05/2021] albumin human     cefTRIAXone (ROCEPHIN)  IV Stopped (11/04/21 1221)   PRN Meds:.acetaminophen **OR** acetaminophen, albuterol, loperamide, ondansetron **OR** ondansetron (ZOFRAN) IV  Antibiotics  :    Anti-infectives (From admission, onward)    Start     Dose/Rate Route Frequency Ordered Stop   11/04/21 1200  cefTRIAXone (ROCEPHIN) 2 g in sodium chloride 0.9 % 100 mL IVPB        2 g 200 mL/hr over 30 Minutes Intravenous Every 24 hours 11/03/21 1424     11/03/21 1215  cefTRIAXone (ROCEPHIN) 2 g in sodium chloride 0.9 % 100 mL IVPB        2 g 200 mL/hr over 30 Minutes Intravenous  Once 11/03/21 1213 11/03/21 1333          Arron Mcnaught M.D on 11/04/2021 at 5:01 PM  To page go to [email or web address]   [facility] -  Office  [phone]       Objective:   Vitals:   11/04/21 0400 11/04/21 0532 11/04/21 0913 11/04/21 1643  BP: 117/68 113/66 (!) 102/58 (!) 112/59  Pulse: 100 96 (!) 102 (!) 103  Resp: 20 19 18 18   Temp: 98.4 F (36.9 C) 98.9 F (37.2 C) 98.3 F (36.8 C) 98.2 F (36.8 C)  TempSrc: Oral Oral Oral Oral  SpO2: 96% 94% 97% 97%  Weight: 81.6 kg     Height:        Wt Readings from Last 3 Encounters:  11/04/21 81.6 kg  11/01/21 85.7 kg  10/22/21 83 kg     Intake/Output Summary (Last 24 hours) at 11/04/2021 1701 Last data filed at  11/04/2021 1649 Gross per 24 hour  Intake 1703.86 ml  Output --  Net 1703.86 ml     Physical Exam  Awake Alert, No new F.N deficits, Normal affect [place].AT,PERRAL Supple Neck,No JVD, No cervical lymphadenopathy appriciated.  Symmetrical Chest wall movement, Good air movement bilaterally, CTAB RRR,No Gallops,Rubs or new Murmurs, No Parasternal Heave +ve B.Sounds, Abd Soft, abdomen is nontender, mild ascites,, No organomegaly appriciated, No rebound - guarding or rigidity. No Cyanosis, Clubbing or edema, No new Rash or bruise      Data Review:    CBC Recent Labs  Lab 11/02/21 2105 11/03/21 1804 11/04/21 0030 11/04/21 0419  WBC 5.2  --   --  5.2  HGB 8.8* 7.9* 9.0* 8.8*  HCT 27.6* 24.7* 28.1* 26.6*  PLT 146*  --   --  119*  MCV 106.6*  --   --  101.5*  MCH 34.0  --   --  33.6  MCHC 31.9  --   --  33.1  RDW 16.4*  --   --  17.2*  LYMPHSABS 0.5*  --   --   --   MONOABS 0.4  --   --   --   EOSABS 0.0  --   --   --   BASOSABS 0.0  --   --   --     Recent Labs  Lab 11/02/21 2105 11/03/21 1245 11/03/21 1804 11/04/21 0419  NA 137  --   --  137  K 4.1  --   --  4.3  CL 101  --   --  104  CO2 28  --   --  26  GLUCOSE 194*  --   --  103*  BUN 22  --   --  17  CREATININE 1.16  --   --  0.96  CALCIUM 8.5*  --   --  8.5*  AST 47*  --   --   --   ALT 25  --   --   --   ALKPHOS 240*  --   --   --   BILITOT 0.9  --   --   --   ALBUMIN 2.7*  --   --   --   LATICACIDVEN 2.5* 1.3 1.3  --   INR  --   --  1.3*  --   HGBA1C  --   --  5.4  --     ------------------------------------------------------------------------------------------------------------------ No results for input(s): CHOL, HDL, LDLCALC, TRIG, CHOLHDL, LDLDIRECT in the last 72 hours.  Lab Results  Component  Value Date   HGBA1C 5.4 11/03/2021   ------------------------------------------------------------------------------------------------------------------ No results for input(s): TSH, T4TOTAL, T3FREE,  THYROIDAB in the last 72 hours.  Invalid input(s): FREET3  Cardiac Enzymes No results for input(s): CKMB, TROPONINI, MYOGLOBIN in the last 168 hours.  Invalid input(s): CK ------------------------------------------------------------------------------------------------------------------    Component Value Date/Time   BNP 927.6 (H) 12/03/2019 1800    Micro Results Recent Results (from the past 240 hour(s))  Culture, body fluid w Gram Stain-bottle     Status: None (Preliminary result)   Collection Time: 11/02/21  1:23 PM   Specimen: Peritoneal Washings  Result Value Ref Range Status   Specimen Description PERITONEAL  Final   Special Requests ABDOMEN  Final   Gram Stain   Final    WBC PRESENT,BOTH PMN AND MONONUCLEAR NO ORGANISMS SEEN CYTOSPIN SMEAR CRITICAL RESULT CALLED TO, READ BACK BY AND VERIFIED WITH: BOBBY,RN@[id number]  11/03/21 [place] GRAM POSITIVE RODS ANAEROBIC BOTTLE ONLY    Culture   Final    NO GROWTH 2 DAYS Performed at [facility], [place] [address], [place], [place] [address]    Report Status PENDING  Incomplete  Blood culture (routine x 2)     Status: None (Preliminary result)   Collection Time: 11/02/21  9:00 PM   Specimen: BLOOD LEFT ARM  Result Value Ref Range Status   Specimen Description BLOOD LEFT ARM  Final   Special Requests   Final    BOTTLES DRAWN AEROBIC ONLY Blood Culture adequate volume   Culture   Final    NO GROWTH 2 DAYS Performed at [facility], [place] [address] [place], [place] [address]    Report Status PENDING  Incomplete  Urine Culture     Status: Abnormal (Preliminary result)   Collection Time: 11/02/21  9:25 PM   Specimen: Urine, Clean Catch  Result Value Ref Range Status   Specimen Description URINE, CLEAN CATCH  Final   Special Requests   Final    NONE Performed at [facility], [place] [address], [place], [place] [address]    Culture >=100,000 COLONIES/mL ESCHERICHIA COLI (A)  Final   Report Status PENDING  Incomplete   Resp Panel by RT-PCR (Flu A&B, Covid) Peripheral     Status: None   Collection Time: 11/02/21  9:35 PM   Specimen: Peripheral; Nasopharyngeal(NP) swabs in vial transport medium  Result Value Ref Range Status   SARS Coronavirus 2 by RT PCR NEGATIVE NEGATIVE Final    Comment: (NOTE) SARS-CoV-2 target nucleic acids are NOT DETECTED.  The SARS-CoV-2 RNA is generally detectable in upper respiratory specimens during the acute phase of infection. The lowest concentration of SARS-CoV-2 viral copies this assay can detect is 138 copies/mL. A negative result does not preclude SARS-Cov-2 infection and should not be used as the sole basis for treatment or other patient management decisions. A negative result may occur with  improper specimen collection/handling, submission of specimen other than nasopharyngeal swab, presence of viral mutation(s) within the areas targeted by this assay, and inadequate number of viral copies(<138 copies/mL). A negative result must be combined with clinical observations, patient history, and epidemiological information. The expected result is Negative.  Fact Sheet for Patients:  EntrepreneurPulse.com.au  Fact Sheet for Healthcare Providers:  IncredibleEmployment.be  This test is no t yet approved or cleared by the [place] FDA and  has been authorized for detection and/or diagnosis of SARS-CoV-2 by FDA under an Emergency Use Authorization (EUA). This EUA will remain  in effect (  meaning this test can be used) for the duration of the COVID-19 declaration under Section 564(b)(1) of the Act, 21 U.S.C.section 360bbb-3(b)(1), unless the authorization is terminated  or revoked sooner.       Influenza A by PCR NEGATIVE NEGATIVE Final   Influenza B by PCR NEGATIVE NEGATIVE Final    Comment: (NOTE) The Xpert Xpress SARS-CoV-2/FLU/RSV plus assay is intended as an aid in the diagnosis of influenza from Nasopharyngeal swab  specimens and should not be used as a sole basis for treatment. Nasal washings and aspirates are unacceptable for Xpert Xpress SARS-CoV-2/FLU/RSV testing.  Fact Sheet for Patients: EntrepreneurPulse.com.au  Fact Sheet for Healthcare Providers: IncredibleEmployment.be  This test is not yet approved or cleared by the [place] FDA and has been authorized for detection and/or diagnosis of SARS-CoV-2 by FDA under an Emergency Use Authorization (EUA). This EUA will remain in effect (meaning this test can be used) for the duration of the COVID-19 declaration under Section 564(b)(1) of the Act, 21 U.S.C. section 360bbb-3(b)(1), unless the authorization is terminated or revoked.  Performed at [facility], [place] [address] [place], [place] [address]   Blood culture (routine x 2)     Status: None (Preliminary result)   Collection Time: 11/02/21  9:45 PM   Specimen: BLOOD RIGHT ARM  Result Value Ref Range Status   Specimen Description BLOOD RIGHT ARM  Final   Special Requests   Final    BOTTLES DRAWN AEROBIC AND ANAEROBIC Blood Culture adequate volume   Culture   Final    NO GROWTH 2 DAYS Performed at [facility], [place] [address] [place], [place] [address]    Report Status PENDING  Incomplete    Radiology Reports DG Chest 2 View  Result Date: 11/03/2021 CLINICAL DATA:  Persistent cough EXAM: CHEST - 2 VIEW COMPARISON:  Chest x-ray 07/15/2021 FINDINGS: Heart size is within normal limits. Calcified plaques in the aortic arch. Chronic mildly prominent interstitial lung markings bilaterally. No focal consolidation identified. No pleural effusion or pneumothorax visualized. IMPRESSION: No acute intrathoracic process identified. Electronically Signed   By: Ofilia Neas M.D.   On: 11/03/2021 13:28   ECHOCARDIOGRAM COMPLETE  Result Date: 10/06/2021    ECHOCARDIOGRAM REPORT   Patient Name:   JAYQUAN BRADSHER Date of Exam: 10/06/2021  Medical Rec #:  [id number]      Height:       71.0 in Accession #:    [id number]     Weight:       196.0 lb Date of Birth:  [birth year]      BSA:          2.091 m Patient Age:    50 years       BP:           116/69 mmHg Patient Gender: M              HR:           96 bpm. Exam Location:  [facility] Procedure: 2D Echo, Cardiac Doppler and Color Doppler Indications:    I35.0 (ICD-10-CM) - Aortic valve stenosis, etiology of cardiac                 valve disease unspecified  History:        Patient has prior history of Echocardiogram examinations, most                 recent 06/17/2020. CHF, COPD; Risk Factors:Hypertension, Diabetes  and Dyslipidemia. CKD (chronic kidney disease), stage III,                 Hepatocellular carcinoma.  Sonographer:    Alvino Chapel RCS Referring Phys: [id number] Verta Ellen.  Sonographer Comments: Image acquisition challenging due to patient body habitus. IMPRESSIONS  1. Left ventricular ejection fraction, by estimation, is 55%. The left ventricle has normal function. The left ventricle has no regional wall motion abnormalities. There is mild left ventricular hypertrophy. Left ventricular diastolic parameters are consistent with Grade I diastolic dysfunction (impaired relaxation).  2. Right ventricular systolic function is normal. The right ventricular size is normal.  3. Left atrial size was mildly dilated.  4. The mitral valve is abnormal. No evidence of mitral valve regurgitation. No evidence of mitral stenosis. Moderate mitral annular calcification.  5. Gradients valve area and DVI similar to echo done 06/17/20. The aortic valve is calcified. Aortic valve regurgitation is not visualized. Moderate aortic valve stenosis.  6. The inferior vena cava is normal in size with greater than 50% respiratory variability, suggesting right atrial pressure of 3 mmHg. FINDINGS  Left Ventricle: Left ventricular ejection fraction, by estimation, is 55%. The left ventricle has normal  function. The left ventricle has no regional wall motion abnormalities. Definity contrast agent was given IV to delineate the left ventricular endocardial borders. The left ventricular internal cavity size was normal in size. There is mild left ventricular hypertrophy. Left ventricular diastolic parameters are consistent with Grade I diastolic dysfunction (impaired relaxation). Right Ventricle: The right ventricular size is normal. No increase in right ventricular wall thickness. Right ventricular systolic function is normal. Left Atrium: Left atrial size was mildly dilated. Right Atrium: Right atrial size was normal in size. Pericardium: There is no evidence of pericardial effusion. Mitral Valve: The mitral valve is abnormal. There is moderate thickening of the mitral valve leaflet(s). There is moderate calcification of the mitral valve leaflet(s). Moderate mitral annular calcification. No evidence of mitral valve regurgitation. No evidence of mitral valve stenosis. Tricuspid Valve: The tricuspid valve is normal in structure. Tricuspid valve regurgitation is not demonstrated. No evidence of tricuspid stenosis. Aortic Valve: Gradients valve area and DVI similar to echo done 06/17/20. The aortic valve is calcified. Aortic valve regurgitation is not visualized. Moderate aortic stenosis is present. Aortic valve mean gradient measures 16.0 mmHg. Aortic valve peak gradient measures 25.1 mmHg. Aortic valve area, by VTI measures 1.21 cm. Pulmonic Valve: The pulmonic valve was normal in structure. Pulmonic valve regurgitation is not visualized. No evidence of pulmonic stenosis. Aorta: The aortic root is normal in size and structure. Venous: The inferior vena cava is normal in size with greater than 50% respiratory variability, suggesting right atrial pressure of 3 mmHg. IAS/Shunts: The interatrial septum was not well visualized.  LEFT VENTRICLE PLAX 2D LVIDd:         4.80 cm   Diastology LVIDs:         2.90 cm   LV e'  medial:    6.31 cm/s LV PW:         1.00 cm   LV E/e' medial:  9.5 LV IVS:        1.30 cm   LV e' lateral:   5.98 cm/s LVOT diam:     2.10 cm   LV E/e' lateral: 10.1 LV SV:         68 LV SV Index:   32 LVOT Area:     3.46 cm  RIGHT VENTRICLE  RV S prime:     12.15 cm/s TAPSE (M-mode): 1.5 cm LEFT ATRIUM             Index        RIGHT ATRIUM           Index LA diam:        2.80 cm 1.34 cm/m   RA Area:     11.80 cm LA Vol (A2C):   50.1 ml 23.97 ml/m  RA Volume:   21.10 ml  10.09 ml/m LA Vol (A4C):   89.7 ml 42.91 ml/m LA Biplane Vol: 68.4 ml 32.72 ml/m  AORTIC VALVE AV Area (Vmax):    1.14 cm AV Area (Vmean):   1.05 cm AV Area (VTI):     1.21 cm AV Vmax:           250.50 cm/s AV Vmean:          187.500 cm/s AV VTI:            0.560 m AV Peak Grad:      25.1 mmHg AV Mean Grad:      16.0 mmHg LVOT Vmax:         82.40 cm/s LVOT Vmean:        56.800 cm/s LVOT VTI:          0.196 m LVOT/AV VTI ratio: 0.35  AORTA Ao Root diam: 3.80 cm MITRAL VALVE MV Area (PHT): 4.21 cm     SHUNTS MV Decel Time: 180 msec     Systemic VTI:  0.20 m MV E velocity: 60.20 cm/s   Systemic Diam: 2.10 cm MV A velocity: 100.00 cm/s MV E/A ratio:  0.60 Jenkins Rouge MD Electronically signed by Jenkins Rouge MD Signature Date/Time: 10/06/2021/5:46:35 PM    Final    IR Paracentesis  Result Date: 11/02/2021 INDICATION: Patient with history of cirrhosis and hepatocellular carcinoma with recurrent ascites. Request is for therapeutic paracentesis. 7 L maximum EXAM: ULTRASOUND GUIDED THERAPEUTIC PARACENTESIS MEDICATIONS: Lidocaine 1% 10 mL COMPLICATIONS: None immediate. PROCEDURE: Informed written consent was obtained from the patient after a discussion of the risks, benefits and alternatives to treatment. A timeout was performed prior to the initiation of the procedure. Initial ultrasound scanning demonstrates a large amount of ascites within the right lower abdominal quadrant. The right lower abdomen was prepped and draped in the usual sterile  fashion. 1% lidocaine was used for local anesthesia. Following this, a 19 gauge, 7-cm, Yueh catheter was introduced. An ultrasound image was saved for documentation purposes. The paracentesis was performed. The catheter was removed and a dressing was applied. The patient tolerated the procedure well without immediate post procedural complication. Patient received post-procedure intravenous albumin; see nursing notes for details. FINDINGS: A total of approximately 6 of straw-colored fluid was removed. IMPRESSION: Successful ultrasound-guided therapeutic paracentesis yielding 6 liters of peritoneal fluid. Read by: Rushie Nyhan, NP Electronically Signed   By: Corrie Mckusick D.O.   On: 11/02/2021 15:37   IR Paracentesis  Result Date: 10/12/2021 INDICATION: Patient with a history of cirrhosis and hepatocellular carcinoma presents today with ascites. Interventional radiology asked to perform a diagnostic and therapeutic paracentesis up to 7 L. EXAM: ULTRASOUND GUIDED PARACENTESIS MEDICATIONS: 1% lidocaine 10 mL COMPLICATIONS: None immediate. PROCEDURE: Informed written consent was obtained from the patient after a discussion of the risks, benefits and alternatives to treatment. A timeout was performed prior to the initiation of the procedure. Initial ultrasound scanning demonstrates a large amount of ascites within the right lower  abdominal quadrant. The right lower abdomen was prepped and draped in the usual sterile fashion. 1% lidocaine was used for local anesthesia. Following this, a 19 gauge, 7-cm, Yueh catheter was introduced. An ultrasound image was saved for documentation purposes. The paracentesis was performed. The catheter was removed and a dressing was applied. The patient tolerated the procedure well without immediate post procedural complication. Patient received post-procedure intravenous albumin; see nursing notes for details. FINDINGS: A total of approximately 6.8 L of clear yellow fluid was  removed. Samples were sent to the laboratory as requested by the clinical team. IMPRESSION: Successful ultrasound-guided paracentesis yielding 6.8 liters of peritoneal fluid. Read by: Soyla Dryer, NP Electronically Signed   By: Aletta Edouard M.D.   On: 10/12/2021 16:46                                          Patient ID: [name], male   DOB: 01-10-1941, 81 y.o.   MRN: [id number]

## 2021-11-04 NOTE — Assessment & Plan Note (Signed)
Home blood pressure regimen includes Coreg 6.25 mg twice daily, furosemide 40 mg daily, spironolactone 75 mg daily, and Entresto 24-26 mg twice daily -Continue current regimen as tolerated

## 2021-11-04 NOTE — Assessment & Plan Note (Addendum)
Diabetes has been well controlled not on any medications at this time.  Last hemoglobin A1c was 5.2 in 07/2021. -Hypoglycemic protocols -Controlled with A1c of 5.4. -CBGs before every meal with very sensitive sliding scale insulin -Discontinue CBG checks if blood sugars remain under 180

## 2021-11-05 DIAGNOSIS — Z7189 Other specified counseling: Secondary | ICD-10-CM

## 2021-11-05 DIAGNOSIS — E43 Unspecified severe protein-calorie malnutrition: Secondary | ICD-10-CM | POA: Insufficient documentation

## 2021-11-05 DIAGNOSIS — Z515 Encounter for palliative care: Secondary | ICD-10-CM

## 2021-11-05 LAB — GLUCOSE, CAPILLARY
Glucose-Capillary: 141 mg/dL — ABNORMAL HIGH (ref 70–99)
Glucose-Capillary: 208 mg/dL — ABNORMAL HIGH (ref 70–99)
Glucose-Capillary: 254 mg/dL — ABNORMAL HIGH (ref 70–99)
Glucose-Capillary: 179 mg/dL — ABNORMAL HIGH (ref 70–99)

## 2021-11-05 LAB — CULTURE, BODY FLUID W GRAM STAIN -BOTTLE

## 2021-11-05 MED ORDER — SODIUM CHLORIDE 0.9 % IV SOLN
1.0000 g | Freq: Three times a day (TID) | INTRAVENOUS | Status: DC
  Administered 2021-11-05 – 2021-11-07 (×6): 1 g via INTRAVENOUS
  Filled 2021-11-05 (×11): qty 1

## 2021-11-05 NOTE — TOC Initial Note (Signed)
Transition of Care [facility]) - Initial/Assessment Note    Patient Details  Name: [name] MRN: [id number] Date of Birth: 13-May-1941  [facility] CM/SW Contact:    Ninfa Meeker, RN Phone Number: 11/05/2021, 12:36 PM  Clinical Narrative:                  Transition of Care Dept has reviewed patient ,no TOC needs have been identified at this time. We will Continue to monitor patient's advancement through Interdisciplinary progression rounds.If new patient transition needs arise, please enter a consult.         Patient Goals and CMS Choice        Expected Discharge Plan and Services                                                Prior Living Arrangements/Services                       Activities of Daily Living Home Assistive Devices/Equipment: Blood pressure cuff, Cane (specify quad or straight) ADL Screening (condition at time of admission) Patient's cognitive ability adequate to safely complete daily activities?: Yes Is the patient deaf or have difficulty hearing?: No Does the patient have difficulty seeing, even when wearing glasses/contacts?: No Does the patient have difficulty concentrating, remembering, or making decisions?: No Patient able to express need for assistance with ADLs?: Yes Does the patient have difficulty dressing or bathing?: No Independently performs ADLs?: Yes (appropriate for developmental age) Does the patient have difficulty walking or climbing stairs?: Yes Weakness of Legs: None Weakness of Arms/Hands: None  Permission Sought/Granted                  Emotional Assessment              Admission diagnosis:  Spontaneous bacterial peritonitis ([place]) [K65.2] SBP (spontaneous bacterial peritonitis) ([place]) [K65.2] Patient Active Problem List   Diagnosis Date Noted   Sepsis ([place]) 11/04/2021   Acute lower UTI 11/04/2021   Other ascites    Sepsis due to Spontaneous bacterial peritonitis ([place])  11/03/2021   Malnutrition of moderate degree 07/14/2021   Cirrhosis of liver with ascites ([place])    Mucosal abnormality of stomach    Portal hypertensive gastropathy ([place])    Secondary esophageal varices without bleeding ([place])    Generalized weakness 07/13/2021   Chronic diarrhea 07/13/2021   Diarrhea due to malabsorption 07/13/2021   Iron deficiency anemia due to chronic blood loss 04/19/2021   Hepatocellular carcinoma ([place]) 06/01/2020   Anxiety 01/74/9449   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[place]) 12/14/2019   Aortic atherosclerosis ([place]) 67/59/1638   Chronic systolic heart failure ([place]) 12/05/2019   Macrocytic anemia 03/05/2018   GERD (gastroesophageal reflux disease) 09/30/2017   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 positive 03/29/2016   Thrombocytopenia ([place]) 12/21/2015   B12 deficiency 08/20/2015   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   Diabetic polyneuropathy associated with type 2 diabetes mellitus ([place]) 08/07/2015   Diplopia 06/05/2015   CKD (chronic kidney disease), stage III ([place]) 05/06/2015   Fatty liver 11/04/2014   Candidal balanoposthitis 06/20/2014   PCO (posterior capsular opacification) 06/19/2013   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 Off narcotics 06/05/17 due to negative UDS for opiates x2. Full history 06/12/14. Pain contract signed.  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[place]) 04/06/2007   Hyperlipidemia associated with type 2 diabetes mellitus ([place]) 04/06/2007   Essential hypertension 04/06/2007   Osteoarthritis 04/06/2007   PCP:  [name], MD Pharmacy:   [facility] [address], [place]  [address] [place] [address] Phone: [phone] Fax: [phone]     Social Determinants of Health (SDOH) Interventions    Readmission Risk Interventions Readmission Risk Prevention Plan 07/19/2021  Transportation Screening Complete  PCP or Specialist Appt within 5-7 Days Complete  Home Care Screening Complete  Some recent data might be hidden

## 2021-11-05 NOTE — Progress Notes (Signed)
Pharmacy Antibiotic Note  [name] is a 81 y.o. male admitted on 11/02/2021 with UTI.  Pharmacy has been consulted for cefepime dosing.  Pt has decompensated cirrhosis. Culture grew out clostridium perfringens. Urine grew out e.coli and pseudomonas. Dr Waldron Labs would like to treat the UTI with pseudomonas with cefepime for now.   Plan: Cefepime 1g IV q8  Height: 5\' 11"  (180.3 cm) Weight: 79.1 kg (174 lb 6.1 oz) IBW/kg (Calculated) : 75.3  Temp (24hrs), Avg:98.5 F (36.9 C), Min:98.2 F (36.8 C), Max:99 F (37.2 C)  Recent Labs  Lab 11/02/21 2105 11/03/21 1245 11/03/21 1804 11/04/21 0419  WBC 5.2  --   --  5.2  CREATININE 1.16  --   --  0.96  LATICACIDVEN 2.5* 1.3 1.3  --     Estimated Creatinine Clearance: 65.4 mL/min (by C-G formula based on SCr of 0.96 mg/dL).    No Known Allergies  Antimicrobials this admission: 1/25 ceftriaxone>1/27 1/27 cefepime>>  Dose adjustments this admission:   Microbiology results: 1/24 peritoneal>>clostridium perfringens - usually sens to most things 1/24 urine>>e.coli - pan sens, pseudomonas - pending  Onnie Boer, PharmD, BCIDP, AAHIVP, CPP Infectious Disease Pharmacist 11/05/2021 3:51 PM

## 2021-11-05 NOTE — Consult Note (Signed)
Palliative Medicine Inpatient Consult Note  Consulting Provider: Elgergawy, Silver Huguenin, MD  Reason for consult:   [place] Palliative Medicine Consult  Reason for Consult? goals of care   HPI:  Per intake H&P --> [name] is a 81 y.o. male with medical history significant of diastolic CHF, diabetes mellitus type 2, NASH related cirrhosis with ascites, splenomegaly, thrombocytopenia, hepatocellular Frazier s/p SBRT presents after being advised to come to the hospital for abnormal test results.  He had paracentesis day prior to admission with 6 L of fluid removed given 50 g of albumin x1 post paracentesis.  Cell count showed PMNs of 895 consistent with spontaneous bacterial peritonitis, so he was admitted for further work-up.  Palliative care has been asked to get involved in the setting of decompensated cirrhosis to approach the topic(s) of goals of care.  Clinical Assessment/Goals of Care:  *Please note that this is a verbal dictation therefore any spelling or grammatical errors are due to the "[place] One" system interpretation.  I have reviewed medical records including EPIC notes, labs and imaging, received report from bedside RN, assessed the patient, he is sitting at the edge of his bed in no acute distress .    I met with [name] to further discuss diagnosis prognosis, GOC, EOL wishes, disposition and options.   I introduced Palliative Medicine as specialized medical care for people living with serious illness. It focuses on providing relief from the symptoms and stress of a serious illness. The goal is to improve quality of life for both the patient and the family.  Medical History Review and Understanding:  [name].  He shares that over the past 10 years his health has declined significantly.  We reviewed that in the last 4 years he has had treatment for his Frazier though in the setting  of the treatment his functionality has declined some.  We reviewed his history of heart failure and that his ejection fraction was quite low in the past though it is somewhat better now.  We discussed his history of diabetes.  We shares that his eyesight is quite poor and he was supposed to have a procedure to improve it though opted to not pursue that and now he "cannot see a thing".  We reviewed that he has very severe cirrhosis of the liver and what the long-term outlook of that is inclusive of potentially rehospitalization's for additional abdominal paracentesis.  We reviewed that he may get to a point where treatment is no longer effective and options such as a more symptom based approach might be beneficial to him.  Social History:  [name] is from [place], [place].  He has been married to his wife, Thayer Headings for over 60 years.  They share 4 children, 3 grandchildren, and 1 recent great grandchild.  [name] shares that his family is spread out some folks are in [facility], some folks are in [place], and some are in the [place] area.  He shares that he could "fix anything".  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  He is the person who is able to diagnose and fix machinery.  He expresses that he has a cat and a dog.  He loves to "tinker" in his shop though as of recently has not been able to do that due to functional deficits.  He is a  man of faith and practices nondenominational he within Christianity.  Functional and Nutritional State:  [name] lives in a single-family home with his spouse.  He shares with me that his spouse suffers from dementia which is become more pronounced and is caused her to become "a mean woman".  Reviewed that this is quite difficult for him.  He shares that he is able to do everything in the home though he must use a walker for mobility and showers only once every 2 to 3 weeks  as he is afraid of falling.  From a nutritional standpoint [name] has a hearty appetite.  Palliative Symptoms:  Generalized weakness -physical therapy and the mobility team is working with him.  Advance Directives: A detailed discussion was had today regarding advanced directives.  [name] is never completed these though would be interested in doing so while he is present in the hospital.  [name] shares that in the setting of his wife having dementia his daughter [name] or his son [name] would be his primary decision makers.  Code Status: Concepts specific to code status, artifical feeding and hydration, continued IV antibiotics and rehospitalization was had.  At this time [name] does not believe he would want to be on life support or be resuscitated "more than once".  We reviewed comprehensively what cardiopulmonary resuscitation entails inclusive of chest compressions, shocks, medication administration, intubation and often placement onto a mechanical ventilator.  Sharvil expresses that he would not want any of that and again deviates to the reality that he has lived 61 wonderful years on this earth.  He shares that in the last 3 to 4 years though his health has deteriorated and his ability to enjoy things the way he wants had has declined.  We would like to further discuss this with his 4 children.   The difference between a aggressive medical intervention path  and a palliative comfort care path for this patient at this time was had.  I was able to review in brief if [name]'s situation should not improve the idea and concepts associated with hospice care.  Presently [name] is open to outpatient palliative support to continue these difficult conversations.  Goals for the Future:  Ideally, [name] would hope to continue living as independently as possible.  Discussed the importance of continued conversation with family and their  medical providers regarding overall plan of care and treatment options, ensuring decisions  are within the context of the patients values and GOCs.  Provided "Hard Choices for Loving People" booklet.  ___________________________________ Addendum:  With consent from [name], I was able to call his daughter [name] this morning.  We reviewed the above conversation and Khan's thoughts on cardiopulmonary resuscitation.  [name] is going to speak to her 3 siblings and is agreeable to my proceeding with a DO NOT RESUSCITATE order in the future should they not have any opposition.  [name] expresses that what is important is that we honor her father's wishes.  Decision Maker: [name] can presently make decisions for himself though if unable to he would rely on his daughter [name] [phone] to be his primary decision maker  SUMMARY OF RECOMMENDATIONS   Full code for the time being-patient would like to discuss this further with his 4 children, Beryle is very strongly considering a DO NOT RESUSCITATE CODE STATUS change  MOST provided for completion   Appreciate Chaplain helping with advance directives  TOC - OP Palliative support on discharge  Ongoing conversations  Code Status/Advance Care Planning: FULL  CODE  Palliative Prophylaxis:  Aspiration, Bowel Regimen, Delirium Protocol, Frequent Pain Assessment, Oral Care, Palliative Wound Care, and Turn Reposition  Additional Recommendations (Limitations, Scope, Preferences): Continue current care for the time being.   Psycho-social/Spiritual:  Desire for further Chaplaincy support: Yes Additional Recommendations: Education on progression of cirrhosis   Prognosis: Worrisome in the setting of nearing end stage cirrhotic disease recurrent paracentesis, SBP, Albumin 2.7, decreased activity tolerance.   Discharge Planning: Discharge home once medically optimized.  Vitals:   11/04/21 2046 11/05/21 0439  BP: 118/67 (!) 94/55  Pulse: 96 100  Resp:  18  Temp: 98.4 F (36.9 C) 98.3 F (36.8 C)  SpO2: 100% 97%    Intake/Output  Summary (Last 24 hours) at 11/05/2021 8032 Last data filed at 11/05/2021 0300 Gross per 24 hour  Intake 1019 ml  Output --  Net 1019 ml   Last Weight  Most recent update: 11/05/2021  4:43 AM    Weight  79.1 kg (174 lb 6.1 oz)            Gen:  Elderly M in NAD HEENT: moist mucous membranes CV: Regular rate and rhythm PULM: On RA ABD: soft/nontender  EXT: No edema  Neuro: Alert and oriented x3   PPS: 50-60%   This conversation/these recommendations were discussed with patient primary care team, Dr. Waldron Labs  MDM High  Medical Decision Making: 4 #/Complex Problems: 4                    Data Reviewed: 4                Management: 4 (1-Straightforward, 2-Low, 3-Moderate, 4-High) ______________________________________________________ [place] Team Team Cell Phone: [phone] Please utilize secure chat with additional questions, if there is no response within 30 minutes please call the above phone number  Palliative Medicine Team providers are available by phone from 7am to 7pm daily and can be reached through the team cell phone.  Should this patient require assistance outside of these hours, please call the patient's attending physician.

## 2021-11-05 NOTE — Progress Notes (Signed)
PROGRESS NOTE                                                                             PROGRESS NOTE                                                                                                                                                                                                             Patient Demographics:    [name], is a 81 y.o. male, DOB - 1941-04-23, XKG:[id number]  Outpatient Primary MD for the patient is Joshua Olp, MD    LOS - 2  Admit date - 11/02/2021    Chief Complaint  Patient presents with   Abnormal Blood Test Results       Brief Narrative    [name] is a 81 y.o. male with medical history significant of diastolic CHF, diabetes mellitus type 2, NASH related cirrhosis with ascites, splenomegaly, thrombocytopenia, hepatocellular carcinoma s/p SBRT presents after being advised to come to the hospital for abnormal test results.  He had paracentesis day prior to admission with 6 L of fluid removed given 50 g of albumin x1 post paracentesis.  Cell count showed PMNs of 895 consistent with spontaneous bacterial peritonitis, so he was admitted for further work-up.   Subjective:    [name] today denies any fever, chills, abdominal pain, nausea or vomiting   Assessment  & Plan :    * Sepsis due to Spontaneous bacterial peritonitis ([place])- (present on admission) - Sepsis present on admission -  decompensated cirrhosis with ascites (acute on chronic): -He is with multiple paracentesis in the past, most recent 2 days prior to admission, -Continue fluid significant for 895 PMN concerning for SBP.  -Recommendation per GI to continue with IV Rocephin x5 days, then to switch to Septra DS.  For prophylaxis.  -Body fluid culture growing Clostridium perfringens -Dose adjustment of furosemide and spironolactone per GI recommendation -Cytology with no evidence of any atypical cells, only inflammatory cells     Acute lower UTI Urinalysis noted  large leukocytes with rare bacteria, and 21-50 WBCs.  Antibiotics have been given prior to urine cultures being obtained. -Urine culture growing E. coli and Pseudomonas. -Antibiotics as noted above  Cirrhosis of liver with ascites ([place])- (present on admission) - Decompensated liver failure, Lasix and Aldactone management per GI  -Follow-up with ultrasound liver with Doppler -Continue with 2 g sodium diet -We will give albumin 25 g for tomorrow.  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  Last hemoglobin A1c was 5.2 in 07/2021. -Hypoglycemic protocols -Controlled with A1c of 5.4. -CBGs before every meal with very sensitive sliding scale insulin -Discontinue CBG checks if blood sugars remain under 180  Essential hypertension- (present on admission) Home blood pressure regimen includes Coreg 6.25 mg twice daily, furosemide 40 mg daily, spironolactone 75 mg daily, and Entresto 24-26 mg twice daily -Continue current regimen as tolerated  Iron deficiency anemia due to chronic blood loss- (present on admission) Around baseline  BPH (benign prostatic hyperplasia)- (present on admission) Continue Proscar and Flomax  Sepsis ([place]) Please see above discussion under SBP.          Family Communication  : None at bedside  Code Status :  Full  Consults  :  GI, Palliative  Disposition Plan  :home  Status is: Inpatient  Remains inpatient appropriate because: IV antibiotics      DVT Prophylaxis  :   SCDs   Lab Results  Component Value Date   PLT 119 (L) 11/04/2021    Diet :  Diet Order             Diet 2 gram sodium Room service appropriate? Yes; Fluid consistency: Thin  Diet effective now                    Inpatient  Medications  Scheduled Meds:  busPIRone  7.5 mg Oral QHS   carvedilol  6.25 mg Oral BID WC   escitalopram  5 mg Oral Daily   famotidine  20 mg Oral BID   feeding supplement  237 mL Oral TID BM   finasteride  5 mg Oral Daily   furosemide  40 mg Oral Daily   gabapentin  300 mg Oral BID   insulin aspart  0-6 Units Subcutaneous TID WC   multivitamin with minerals  1 tablet Oral Daily   polyvinyl alcohol  1 drop Both Eyes QHS   rosuvastatin  10 mg Oral Daily   sacubitril-valsartan  1 tablet Oral BID   sodium chloride flush  3 mL Intravenous Q12H   spironolactone  75 mg Oral Daily   tamsulosin  0.4 mg Oral Daily   Continuous Infusions:  cefTRIAXone (ROCEPHIN)  IV 2 g (11/05/21 1125)   PRN Meds:.acetaminophen **OR** acetaminophen, albuterol, loperamide, ondansetron **OR** ondansetron (ZOFRAN) IV  Antibiotics  :    Anti-infectives (From admission, onward)    Start     Dose/Rate Route Frequency Ordered Stop   11/04/21 1200  cefTRIAXone (ROCEPHIN) 2 g in sodium chloride 0.9 % 100 mL IVPB        2 g 200 mL/hr over 30 Minutes Intravenous Every 24 hours 11/03/21 1424     11/03/21 1215  cefTRIAXone (ROCEPHIN) 2 g in sodium chloride 0.9 % 100 mL IVPB        2 g 200 mL/hr over 30 Minutes Intravenous  Once 11/03/21 1213 11/03/21 1333          [name] M.D on 11/05/2021 at 3:32 PM  To page go to [email or web address]   [facility] -  Office  [phone]       Objective:   Vitals:   11/04/21 1643 11/04/21 2046 11/05/21 0439 11/05/21 0800  BP: (!) 112/59 118/67 (!) 94/55 (!) 93/59  Pulse: (!) 103 96 100 100  Resp: 18  18 19   Temp: 98.2 F (36.8 C) 98.4 F (36.9 C) 98.3 F (36.8 C) 99 F (37.2 C)  TempSrc: Oral Oral Oral Oral  SpO2: 97% 100% 97% 96%  Weight:   79.1 kg   Height:        Wt Readings from Last 3 Encounters:  11/05/21 79.1 kg  11/01/21 85.7 kg  10/22/21 83 kg     Intake/Output Summary (Last 24 hours) at 11/05/2021 1532 Last data filed at  11/05/2021 1306 Gross per 24 hour  Intake 1299 ml  Output 700 ml  Net 599 ml     Physical Exam  Awake Alert, Oriented X 3, No new F.N deficits, Normal affect Symmetrical Chest wall movement, Good air movement bilaterally, CTAB RRR,No Gallops,Rubs or new Murmurs, No Parasternal Heave +ve B.Sounds, Abd Soft, mild ascites, No rebound - guarding or rigidity. No Cyanosis, Clubbing or edema, No new Rash or bruise       Data Review:    CBC Recent Labs  Lab 11/02/21 2105 11/03/21 1804 11/04/21 0030 11/04/21 0419  WBC 5.2  --   --  5.2  HGB 8.8* 7.9* 9.0* 8.8*  HCT 27.6* 24.7* 28.1* 26.6*  PLT 146*  --   --  119*  MCV 106.6*  --   --  101.5*  MCH 34.0  --   --  33.6  MCHC 31.9  --   --  33.1  RDW 16.4*  --   --  17.2*  LYMPHSABS 0.5*  --   --   --   MONOABS 0.4  --   --   --   EOSABS 0.0  --   --   --   BASOSABS 0.0  --   --   --     Recent Labs  Lab 11/02/21 2105 11/03/21 1245 11/03/21 1804 11/04/21 0419  NA 137  --   --  137  K 4.1  --   --  4.3  CL 101  --   --  104  CO2 28  --   --  26  GLUCOSE 194*  --   --  103*  BUN 22  --   --  17  CREATININE 1.16  --   --  0.96  CALCIUM 8.5*  --   --  8.5*  AST 47*  --   --   --   ALT 25  --   --   --   ALKPHOS 240*  --   --   --   BILITOT 0.9  --   --   --   ALBUMIN 2.7*  --   --   --   LATICACIDVEN 2.5* 1.3 1.3  --   INR  --   --  1.3*  --   HGBA1C  --   --  5.4  --     ------------------------------------------------------------------------------------------------------------------ No results for input(s): CHOL, HDL, LDLCALC, TRIG, CHOLHDL, LDLDIRECT in the last 72 hours.  Lab Results  Component Value Date   HGBA1C 5.4 11/03/2021   ------------------------------------------------------------------------------------------------------------------  No results for input(s): TSH, T4TOTAL, T3FREE, THYROIDAB in the last 72 hours.  Invalid input(s): FREET3  Cardiac Enzymes No results for input(s): CKMB,  TROPONINI, MYOGLOBIN in the last 168 hours.  Invalid input(s): CK ------------------------------------------------------------------------------------------------------------------    Component Value Date/Time   BNP 927.6 (H) 12/03/2019 1800    Micro Results Recent Results (from the past 240 hour(s))  Culture, body fluid w Gram Stain-bottle     Status: Abnormal   Collection Time: 11/02/21  1:23 PM   Specimen: Peritoneal Washings  Result Value Ref Range Status   Specimen Description PERITONEAL  Final   Special Requests ABDOMEN  Final   Gram Stain   Final    WBC PRESENT,BOTH PMN AND MONONUCLEAR CYTOSPIN SMEAR CRITICAL RESULT CALLED TO, READ BACK BY AND VERIFIED WITH: BOBBY,RN@[id number]  11/03/21 [place] GRAM POSITIVE RODS ANAEROBIC BOTTLE ONLY Performed at [facility], [place] [address] [place], [place] [address]    Culture CLOSTRIDIUM PERFRINGENS (A)  Final   Report Status 11/05/2021 FINAL  Final  Blood culture (routine x 2)     Status: None (Preliminary result)   Collection Time: 11/02/21  9:00 PM   Specimen: BLOOD LEFT ARM  Result Value Ref Range Status   Specimen Description BLOOD LEFT ARM  Final   Special Requests   Final    BOTTLES DRAWN AEROBIC ONLY Blood Culture adequate volume   Culture   Final    NO GROWTH 3 DAYS Performed at [facility], [place] [address] [place], [place] [address]    Report Status PENDING  Incomplete  Urine Culture     Status: Abnormal (Preliminary result)   Collection Time: 11/02/21  9:25 PM   Specimen: Urine, Clean Catch  Result Value Ref Range Status   Specimen Description URINE, CLEAN CATCH  Final   Special Requests   Final    NONE Performed at [facility], [place] [address], [place], [place] [address]    Culture (A)  Final    >=100,000 COLONIES/mL ESCHERICHIA COLI 50,000 COLONIES/mL PSEUDOMONAS AERUGINOSA    Report Status PENDING  Incomplete   Organism ID, Bacteria ESCHERICHIA COLI (A)  Final      Susceptibility   Escherichia  coli - MIC*    AMPICILLIN <=2 SENSITIVE Sensitive     CEFAZOLIN <=4 SENSITIVE Sensitive     CEFEPIME <=0.12 SENSITIVE Sensitive     CEFTRIAXONE <=0.25 SENSITIVE Sensitive     CIPROFLOXACIN <=0.25 SENSITIVE Sensitive     GENTAMICIN <=1 SENSITIVE Sensitive     IMIPENEM <=0.25 SENSITIVE Sensitive     NITROFURANTOIN <=16 SENSITIVE Sensitive     TRIMETH/SULFA <=20 SENSITIVE Sensitive     AMPICILLIN/SULBACTAM <=2 SENSITIVE Sensitive     PIP/TAZO <=4 SENSITIVE Sensitive     * >=100,000 COLONIES/mL ESCHERICHIA COLI  Resp Panel by RT-PCR (Flu A&B, Covid) Peripheral     Status: None   Collection Time: 11/02/21  9:35 PM   Specimen: Peripheral; Nasopharyngeal(NP) swabs in vial transport medium  Result Value Ref Range Status   SARS Coronavirus 2 by RT PCR NEGATIVE NEGATIVE Final    Comment: (NOTE) SARS-CoV-2 target nucleic acids are NOT DETECTED.  The SARS-CoV-2 RNA is generally detectable in upper respiratory specimens during the acute phase of infection. The lowest concentration of SARS-CoV-2 viral copies this assay can detect is 138 copies/mL. A negative result does not preclude SARS-Cov-2 infection and should not be used as the sole basis for treatment or other patient management decisions. A negative result may  occur with  improper specimen collection/handling, submission of specimen other than nasopharyngeal swab, presence of viral mutation(s) within the areas targeted by this assay, and inadequate number of viral copies(<138 copies/mL). A negative result must be combined with clinical observations, patient history, and epidemiological information. The expected result is Negative.  Fact Sheet for Patients:  EntrepreneurPulse.com.au  Fact Sheet for Healthcare Providers:  IncredibleEmployment.be  This test is no t yet approved or cleared by the [place] FDA and  has been authorized for detection and/or diagnosis of SARS-CoV-2 by FDA under an  Emergency Use Authorization (EUA). This EUA will remain  in effect (meaning this test can be used) for the duration of the COVID-19 declaration under Section 564(b)(1) of the Act, 21 U.S.C.section 360bbb-3(b)(1), unless the authorization is terminated  or revoked sooner.       Influenza A by PCR NEGATIVE NEGATIVE Final   Influenza B by PCR NEGATIVE NEGATIVE Final    Comment: (NOTE) The Xpert Xpress SARS-CoV-2/FLU/RSV plus assay is intended as an aid in the diagnosis of influenza from Nasopharyngeal swab specimens and should not be used as a sole basis for treatment. Nasal washings and aspirates are unacceptable for Xpert Xpress SARS-CoV-2/FLU/RSV testing.  Fact Sheet for Patients: EntrepreneurPulse.com.au  Fact Sheet for Healthcare Providers: IncredibleEmployment.be  This test is not yet approved or cleared by the [place] FDA and has been authorized for detection and/or diagnosis of SARS-CoV-2 by FDA under an Emergency Use Authorization (EUA). This EUA will remain in effect (meaning this test can be used) for the duration of the COVID-19 declaration under Section 564(b)(1) of the Act, 21 U.S.C. section 360bbb-3(b)(1), unless the authorization is terminated or revoked.  Performed at [facility], [place] [address], [place], [place] [address]   Blood culture (routine x 2)     Status: None (Preliminary result)   Collection Time: 11/02/21  9:45 PM   Specimen: BLOOD RIGHT ARM  Result Value Ref Range Status   Specimen Description BLOOD RIGHT ARM  Final   Special Requests   Final    BOTTLES DRAWN AEROBIC AND ANAEROBIC Blood Culture adequate volume   Culture   Final    NO GROWTH 3 DAYS Performed at [facility], [place] [address], [place], [place] [address]    Report Status PENDING  Incomplete    Radiology Reports DG Chest 2 View  Result Date: 11/03/2021 CLINICAL DATA:  Persistent cough EXAM: CHEST - 2 VIEW COMPARISON:  Chest  x-ray 07/15/2021 FINDINGS: Heart size is within normal limits. Calcified plaques in the aortic arch. Chronic mildly prominent interstitial lung markings bilaterally. No focal consolidation identified. No pleural effusion or pneumothorax visualized. IMPRESSION: No acute intrathoracic process identified. Electronically Signed   By: Ofilia Neas M.D.   On: 11/03/2021 13:28   ECHOCARDIOGRAM COMPLETE  Result Date: 10/06/2021    ECHOCARDIOGRAM REPORT   Patient Name:   JARIAH JARMON Date of Exam: 10/06/2021 Medical Rec #:  [id number]      Height:       71.0 in Accession #:    [id number]     Weight:       196.0 lb Date of Birth:  1941-03-19      BSA:          2.091 m Patient Age:    89 years       BP:           116/69 mmHg Patient Gender: M  HR:           96 bpm. Exam Location:  [facility] Procedure: 2D Echo, Cardiac Doppler and Color Doppler Indications:    I35.0 (ICD-10-CM) - Aortic valve stenosis, etiology of cardiac                 valve disease unspecified  History:        Patient has prior history of Echocardiogram examinations, most                 recent 06/17/2020. CHF, COPD; Risk Factors:Hypertension, Diabetes                 and Dyslipidemia. CKD (chronic kidney disease), stage III,                 Hepatocellular carcinoma.  Sonographer:    Alvino Chapel RCS Referring Phys: [id number] Verta Ellen.  Sonographer Comments: Image acquisition challenging due to patient body habitus. IMPRESSIONS  1. Left ventricular ejection fraction, by estimation, is 55%. The left ventricle has normal function. The left ventricle has no regional wall motion abnormalities. There is mild left ventricular hypertrophy. Left ventricular diastolic parameters are consistent with Grade I diastolic dysfunction (impaired relaxation).  2. Right ventricular systolic function is normal. The right ventricular size is normal.  3. Left atrial size was mildly dilated.  4. The mitral valve is abnormal. No evidence of mitral valve  regurgitation. No evidence of mitral stenosis. Moderate mitral annular calcification.  5. Gradients valve area and DVI similar to echo done 06/17/20. The aortic valve is calcified. Aortic valve regurgitation is not visualized. Moderate aortic valve stenosis.  6. The inferior vena cava is normal in size with greater than 50% respiratory variability, suggesting right atrial pressure of 3 mmHg. FINDINGS  Left Ventricle: Left ventricular ejection fraction, by estimation, is 55%. The left ventricle has normal function. The left ventricle has no regional wall motion abnormalities. Definity contrast agent was given IV to delineate the left ventricular endocardial borders. The left ventricular internal cavity size was normal in size. There is mild left ventricular hypertrophy. Left ventricular diastolic parameters are consistent with Grade I diastolic dysfunction (impaired relaxation). Right Ventricle: The right ventricular size is normal. No increase in right ventricular wall thickness. Right ventricular systolic function is normal. Left Atrium: Left atrial size was mildly dilated. Right Atrium: Right atrial size was normal in size. Pericardium: There is no evidence of pericardial effusion. Mitral Valve: The mitral valve is abnormal. There is moderate thickening of the mitral valve leaflet(s). There is moderate calcification of the mitral valve leaflet(s). Moderate mitral annular calcification. No evidence of mitral valve regurgitation. No evidence of mitral valve stenosis. Tricuspid Valve: The tricuspid valve is normal in structure. Tricuspid valve regurgitation is not demonstrated. No evidence of tricuspid stenosis. Aortic Valve: Gradients valve area and DVI similar to echo done 06/17/20. The aortic valve is calcified. Aortic valve regurgitation is not visualized. Moderate aortic stenosis is present. Aortic valve mean gradient measures 16.0 mmHg. Aortic valve peak gradient measures 25.1 mmHg. Aortic valve area, by VTI  measures 1.21 cm. Pulmonic Valve: The pulmonic valve was normal in structure. Pulmonic valve regurgitation is not visualized. No evidence of pulmonic stenosis. Aorta: The aortic root is normal in size and structure. Venous: The inferior vena cava is normal in size with greater than 50% respiratory variability, suggesting right atrial pressure of 3 mmHg. IAS/Shunts: The interatrial septum was not well visualized.  LEFT VENTRICLE PLAX 2D LVIDd:  4.80 cm   Diastology LVIDs:         2.90 cm   LV e' medial:    6.31 cm/s LV PW:         1.00 cm   LV E/e' medial:  9.5 LV IVS:        1.30 cm   LV e' lateral:   5.98 cm/s LVOT diam:     2.10 cm   LV E/e' lateral: 10.1 LV SV:         68 LV SV Index:   32 LVOT Area:     3.46 cm  RIGHT VENTRICLE RV S prime:     12.15 cm/s TAPSE (M-mode): 1.5 cm LEFT ATRIUM             Index        RIGHT ATRIUM           Index LA diam:        2.80 cm 1.34 cm/m   RA Area:     11.80 cm LA Vol (A2C):   50.1 ml 23.97 ml/m  RA Volume:   21.10 ml  10.09 ml/m LA Vol (A4C):   89.7 ml 42.91 ml/m LA Biplane Vol: 68.4 ml 32.72 ml/m  AORTIC VALVE AV Area (Vmax):    1.14 cm AV Area (Vmean):   1.05 cm AV Area (VTI):     1.21 cm AV Vmax:           250.50 cm/s AV Vmean:          187.500 cm/s AV VTI:            0.560 m AV Peak Grad:      25.1 mmHg AV Mean Grad:      16.0 mmHg LVOT Vmax:         82.40 cm/s LVOT Vmean:        56.800 cm/s LVOT VTI:          0.196 m LVOT/AV VTI ratio: 0.35  AORTA Ao Root diam: 3.80 cm MITRAL VALVE MV Area (PHT): 4.21 cm     SHUNTS MV Decel Time: 180 msec     Systemic VTI:  0.20 m MV E velocity: 60.20 cm/s   Systemic Diam: 2.10 cm MV A velocity: 100.00 cm/s MV E/A ratio:  0.60 Jenkins Rouge MD Electronically signed by Jenkins Rouge MD Signature Date/Time: 10/06/2021/5:46:35 PM    Final    US LIVER DOPPLER  Result Date: 11/05/2021 CLINICAL DATA:  Ascites. History of hepatocellular carcinoma. Placement of fiducial markers in liver for treatment. EXAM: DUPLEX  ULTRASOUND OF LIVER TECHNIQUE: Color and duplex Doppler ultrasound was performed to evaluate the hepatic in-flow and out-flow vessels. COMPARISON:  CT 07/12/2021 FINDINGS: Liver: Limited evaluation of the liver. Main Portal Vein size: 1.7 cm Portal Vein Velocities Main Prox:  19 cm/sec Main Mid: 17 cm/sec Main Dist:  13 cm/sec Right: 9 cm/sec Left: 16 cm/sec Hepatic Vein Velocities Right:  33 cm/sec Middle:  20 cm/sec Left:  23 cm/sec IVC: Present and patent with normal respiratory phasicity. Hepatic Artery Velocity:  47 cm/sec Splenic Vein Velocity:  19 cm/sec Spleen: 12.4 cm x 6.1 cm x 4.4 cm with a total volume of 173 cm^3 (411 cm^3 is upper limit normal) Portal Vein Occlusion/Thrombus: No Splenic Vein Occlusion/Thrombus: No Ascites: Present Varices: None Normal hepatopetal flow in the portal veins. Normal hepatofugal flow in the hepatic veins. Perihepatic ascites. Perisplenic ascites. IMPRESSION: 1. Portal venous system is patent with normal direction of flow.  2. Ascites. Electronically Signed   By: Markus Daft M.D.   On: 11/05/2021 08:58   IR Paracentesis  Result Date: 11/02/2021 INDICATION: Patient with history of cirrhosis and hepatocellular carcinoma with recurrent ascites. Request is for therapeutic paracentesis. 7 L maximum EXAM: ULTRASOUND GUIDED THERAPEUTIC PARACENTESIS MEDICATIONS: Lidocaine 1% 10 mL COMPLICATIONS: None immediate. PROCEDURE: Informed written consent was obtained from the patient after a discussion of the risks, benefits and alternatives to treatment. A timeout was performed prior to the initiation of the procedure. Initial ultrasound scanning demonstrates a large amount of ascites within the right lower abdominal quadrant. The right lower abdomen was prepped and draped in the usual sterile fashion. 1% lidocaine was used for local anesthesia. Following this, a 19 gauge, 7-cm, Yueh catheter was introduced. An ultrasound image was saved for documentation purposes. The paracentesis was  performed. The catheter was removed and a dressing was applied. The patient tolerated the procedure well without immediate post procedural complication. Patient received post-procedure intravenous albumin; see nursing notes for details. FINDINGS: A total of approximately 6 of straw-colored fluid was removed. IMPRESSION: Successful ultrasound-guided therapeutic paracentesis yielding 6 liters of peritoneal fluid. Read by: Rushie Nyhan, NP Electronically Signed   By: Corrie Mckusick D.O.   On: 11/02/2021 15:37   IR Paracentesis  Result Date: 10/12/2021 INDICATION: Patient with a history of cirrhosis and hepatocellular carcinoma presents today with ascites. Interventional radiology asked to perform a diagnostic and therapeutic paracentesis up to 7 L. EXAM: ULTRASOUND GUIDED PARACENTESIS MEDICATIONS: 1% lidocaine 10 mL COMPLICATIONS: None immediate. PROCEDURE: Informed written consent was obtained from the patient after a discussion of the risks, benefits and alternatives to treatment. A timeout was performed prior to the initiation of the procedure. Initial ultrasound scanning demonstrates a large amount of ascites within the right lower abdominal quadrant. The right lower abdomen was prepped and draped in the usual sterile fashion. 1% lidocaine was used for local anesthesia. Following this, a 19 gauge, 7-cm, Yueh catheter was introduced. An ultrasound image was saved for documentation purposes. The paracentesis was performed. The catheter was removed and a dressing was applied. The patient tolerated the procedure well without immediate post procedural complication. Patient received post-procedure intravenous albumin; see nursing notes for details. FINDINGS: A total of approximately 6.8 L of clear yellow fluid was removed. Samples were sent to the laboratory as requested by the clinical team. IMPRESSION: Successful ultrasound-guided paracentesis yielding 6.8 liters of peritoneal fluid. Read by: Soyla Dryer,  NP Electronically Signed   By: Aletta Edouard M.D.   On: 10/12/2021 16:46                                          Patient ID: [name], male   DOB: 17-May-1941, 81 y.o.   MRN: [id number] Patient ID: AMAURI KEEFE, male   DOB: 1941-02-27, 81 y.o.   MRN: [id number]

## 2021-11-05 NOTE — Care Management Important Message (Signed)
Important Message  Patient Details  Name: [name] MRN: [id number] Date of Birth: 11/01/1940   Medicare Important Message Given:  Yes     Hannah Beat 11/05/2021, 12:43 PM

## 2021-11-05 NOTE — Progress Notes (Signed)
Subjective/Objective No new subjective & objective note has been filed under this hospital service since the last note was generated.   Scheduled Meds:  busPIRone  7.5 mg Oral QHS   carvedilol  6.25 mg Oral BID WC   escitalopram  5 mg Oral Daily   famotidine  20 mg Oral BID   feeding supplement  237 mL Oral TID BM   finasteride  5 mg Oral Daily   furosemide  40 mg Oral Daily   gabapentin  300 mg Oral BID   insulin aspart  0-6 Units Subcutaneous TID WC   multivitamin with minerals  1 tablet Oral Daily   polyvinyl alcohol  1 drop Both Eyes QHS   rosuvastatin  10 mg Oral Daily   sacubitril-valsartan  1 tablet Oral BID   sodium chloride flush  3 mL Intravenous Q12H   spironolactone  75 mg Oral Daily   tamsulosin  0.4 mg Oral Daily   Continuous Infusions:  cefTRIAXone (ROCEPHIN)  IV 2 g (11/05/21 1125)   PRN Meds:acetaminophen **OR** acetaminophen, albuterol, loperamide, ondansetron **OR** ondansetron (ZOFRAN) IV  Vital signs in last 24 hours: Temp:  [98.2 F (36.8 C)-99 F (37.2 C)] 99 F (37.2 C) (01/27 0800) Pulse Rate:  [96-103] 100 (01/27 0800) Resp:  [18-19] 19 (01/27 0800) BP: (93-118)/(55-67) 93/59 (01/27 0800) SpO2:  [96 %-100 %] 96 % (01/27 0800) Weight:  [79.1 kg] 79.1 kg (01/27 0439)  Intake/Output last 3 shifts: I/O last 3 completed shifts: In: 2104.7 [P.O.:919; I.V.:23.2; Blood:1062.5; IV Piggyback:100] Out: -  Intake/Output this shift: Total I/O In: 640 [P.O.:640] Out: 700 [Urine:700]  Problem Assessment/Plan Cardiovascular and Mediastinum Essential hypertension Assessment & Plan Home blood pressure regimen includes Coreg 6.25 mg twice daily, furosemide 40 mg daily, spironolactone 75 mg daily, and Entresto 24-26 mg twice daily -Continue current regimen as tolerated  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  Endocrine DM (diabetes mellitus) type II controlled, neurological manifestation ([place]) Assessment & Plan Diabetes has been well controlled not on any medications at this time.  Last hemoglobin A1c was 5.2 in 07/2021. -Hypoglycemic protocols -Controlled with A1c of 5.4. -CBGs before every meal with very sensitive sliding scale insulin -Discontinue CBG checks if blood sugars remain under 180  Genitourinary BPH (benign prostatic hyperplasia) Assessment & Plan Continue Proscar and Flomax  Acute lower UTI Assessment & Plan Urinalysis noted large leukocytes with rare bacteria, and 21-50 WBCs.  Antibiotics have been given prior to urine cultures being obtained. -Urine culture growing E. coli and Pseudomonas. 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  Iron deficiency anemia due to chronic blood loss Assessment & Plan Around baseline  * Sepsis due to Spontaneous bacterial peritonitis ([place]) Assessment & Plan - Sepsis present on admission -  decompensated cirrhosis with ascites (acute on chronic): -He is with multiple paracentesis in the past, most recent 2 days prior to admission, -Continue fluid significant for 895 PMN concerning for SBP.  -Recommendation per GI to continue with IV Rocephin x5 days, then to switch to Septra DS.  For prophylaxis.  -Body fluid culture growing Clostridium perfringens -Dose adjustment of furosemide and spironolactone per GI recommendation -Cytology with no  evidence of any atypical cells, only inflammatory cells

## 2021-11-06 LAB — URINE CULTURE: Culture: >=100000 — AB

## 2021-11-06 LAB — GLUCOSE, CAPILLARY
Glucose-Capillary: 140 mg/dL — ABNORMAL HIGH (ref 70–99)
Glucose-Capillary: 207 mg/dL — ABNORMAL HIGH (ref 70–99)
Glucose-Capillary: 161 mg/dL — ABNORMAL HIGH (ref 70–99)
Glucose-Capillary: 155 mg/dL — ABNORMAL HIGH (ref 70–99)

## 2021-11-06 MED ORDER — ALBUMIN HUMAN 25 % IV SOLN
25.0000 g | Freq: Four times a day (QID) | INTRAVENOUS | Status: AC
  Administered 2021-11-06 – 2021-11-07 (×5): 25 g via INTRAVENOUS
  Filled 2021-11-06 (×5): qty 100

## 2021-11-06 MED ORDER — HEPARIN SODIUM (PORCINE) 5000 UNIT/ML IJ SOLN
5000.0000 [IU] | Freq: Three times a day (TID) | INTRAMUSCULAR | Status: DC
  Administered 2021-11-06 – 2021-11-07 (×4): 5000 [IU] via SUBCUTANEOUS
  Filled 2021-11-06 (×4): qty 1

## 2021-11-06 NOTE — Assessment & Plan Note (Signed)
Urinalysis noted large leukocytes with rare bacteria, and 21-50 WBCs.  Antibiotics have been given prior to urine cultures being obtained. -Urine culture growing E. coli and Pseudomonas.  So he has been changed to cefepime during hospital stay. -Antibiotics as noted above

## 2021-11-06 NOTE — Assessment & Plan Note (Signed)
-   Decompensated liver failure, Lasix and Aldactone management per GI  -Follow-up with ultrasound liver with Doppler -Continue with 2 g sodium diet -We will plan for paracentesis tomorrow if fluid reaccumulate.

## 2021-11-06 NOTE — Assessment & Plan Note (Signed)
Around baseline

## 2021-11-06 NOTE — Assessment & Plan Note (Signed)
Home blood pressure regimen includes Coreg 6.25 mg twice daily, furosemide 40 mg daily, spironolactone 75 mg daily, and Entresto 24-26 mg twice daily -Continue current regimen as tolerated

## 2021-11-06 NOTE — Assessment & Plan Note (Signed)
Please see above discussion under SBP.

## 2021-11-06 NOTE — Assessment & Plan Note (Signed)
Continue Proscar and Flomax. 

## 2021-11-06 NOTE — Assessment & Plan Note (Signed)
Diabetes has been well controlled not on any medications at this time.  Last hemoglobin A1c was 5.2 in 07/2021. -Hypoglycemic protocols -Controlled with A1c of 5.4. -CBGs before every meal with very sensitive sliding scale insulin -Discontinue CBG checks if blood sugars remain under 180

## 2021-11-06 NOTE — Assessment & Plan Note (Signed)
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  -Recommendation per GI to continue with IV Rocephin x5 days, then to switch to Septra DS.  For prophylaxis.  He is currently on cefepime due to Pseudomonas UTI. -Body fluid culture growing Clostridium perfringens, should be covered by Rocephin and cefepime -Dose adjustment of furosemide and spironolactone per GI recommendation -Cytology with no evidence of any atypical cells, only inflammatory cells -With known history of recurrent ascites, ascites appears to be reaccumulating, discussed with GI, okay to drain before discharge, will start on IV albumin tomorrow with anticipation of paracentesis tomorrow Of fluid has built up.

## 2021-11-06 NOTE — Progress Notes (Signed)
Palliative Medicine Inpatient Follow Up Note   Consulting Provider: Elgergawy, Silver Huguenin, MD   Reason for consult:   [facility] Consult  Reason for Consult? goals of care    HPI:  Per intake H&P --> [name] is a 81 y.o. male with medical history significant of diastolic CHF, diabetes mellitus type 2, NASH related cirrhosis with ascites, splenomegaly, thrombocytopenia, hepatocellular carcinoma s/p SBRT presents after being advised to come to the hospital for abnormal test results.  He had paracentesis day prior to admission with 6 L of fluid removed given 50 g of albumin x1 post paracentesis.  Cell count showed PMNs of 895 consistent with spontaneous bacterial peritonitis, so he was admitted for further work-up.   Palliative care has been asked to get involved in the setting of decompensated cirrhosis to approach the topic(s) of goals of care.  Today's Discussion (11/06/2021):  *Please note that this is a verbal dictation therefore any spelling or grammatical errors are due to the "[place] [employer]" system interpretation.  Chart reviewed inclusive of progress notes, laboratory results, and diagnostic imaging.  I met with [name] at bedside this morning. He had just gotten back from brushing his hair in the bathroom. We reviewed that I spoke to his daughter, [name] yesterday regarding his desire to be a DNR. He now shares willingness to complete a MOST form. Baley's wishes are as below:  Cardiopulmonary Resuscitation: Do Not Attempt Resuscitation (DNR/No CPR)  Medical Interventions: Limited Additional Interventions: Use medical treatment, IV fluids and cardiac monitoring as indicated, DO NOT USE intubation or mechanical ventilation. May consider use of less invasive airway support such as BiPAP or CPAP. Also provide comfort measures. Transfer to the hospital if indicated. Avoid intensive care.   Antibiotics: Determine use of limitation of antibiotics  when infection occurs  IV Fluids: IV fluids for a defined trial period  Feeding Tube: No feeding tube   Reviewed the importance of also completing advance directives which [name] plans to bring to his MD follow up appointment.  We lamented on [name]'s life. He share's readiness to leave this world when God is ready to take him. He expresses having lived a wonderful 57 years. Offered support through therapeutic listening.  Edgar expresses the desire to go home today. I shared that this will be up to the primary medical team.   Questions and concerns addressed   Palliative Support Provided.   Objective Assessment: Vital Signs Vitals:   11/06/21 0503 11/06/21 0728  BP: 105/65 114/66  Pulse: 95 (!) 107  Resp:  18  Temp: 98.6 F (37 C) 98.7 F (37.1 C)  SpO2: 94% 96%    Intake/Output Summary (Last 24 hours) at 11/06/2021 1256 Last data filed at 11/06/2021 1114 Gross per 24 hour  Intake 957.6 ml  Output 300 ml  Net 657.6 ml   Last Weight  Most recent update: 11/06/2021  5:28 AM    Weight  78.5 kg (173 lb 1 oz)            Gen:  Elderly M in NAD HEENT: moist mucous membranes CV: Regular rate and rhythm PULM: On RA ABD: soft/nontender  EXT: No edema  Neuro: Alert and oriented x3   SUMMARY OF RECOMMENDATIONS   DNAR/DNI  MOST Completed, paper copy placed onto the chart electric copy can be found in [employer]  DNR Form Completed, paper copy placed onto the chart electric copy can be found in [employer]  reviewed - patient will bring to his PCP  TOC - OP Palliative support on discharge  MDM -  High  Medical Decision Making:4 #/Complex Problems: 4                     Data Reviewed:    4             Management: 4 (1-Straightforward, 2-Low, 3-Moderate, 4-High) ______________________________________________________________________________________ [facility] Team Team Cell Phone: [phone] Please utilize secure chat with  additional questions, if there is no response within 30 minutes please call the above phone number  Palliative Medicine Team providers are available by phone from 7am to 7pm daily and can be reached through the team cell phone.  Should this patient require assistance outside of these hours, please call the patient's attending physician.

## 2021-11-06 NOTE — Progress Notes (Signed)
Mobility Specialist Progress Note   11/06/21 1500  Mobility  Activity Ambulated with assistance in room;Ambulated with assistance to bathroom;Ambulated with assistance in hallway  Level of Assistance Contact guard assist, steadying assist  Distance Ambulated (ft) 184 ft  Activity Response Tolerated well  $Mobility charge 1 Mobility   No reported pain prior to session. Hesitant to ambulate d/t ulcer on right foot causing shooting pain through RLE but is okay w/ short bouts in room w/ emphasis on heel strikes. Prior to ambulation pt requesting to use BR and capable of making transfer w/ only supervision. Successful void. For the remainder, pt able to ambulate in room and in hallway w/ x2 seated rest breaks throughout. Diabetic shoes were used for ambulation in hall way, mod I for donning and doffing shoes. Returned back to EOB w/ slight complaint of soreness in foot, left call bell in reach and notified RN.   [place]land Falling Mobility Specialist Phone Number [phone]

## 2021-11-06 NOTE — Progress Notes (Signed)
PROGRESS NOTE                                                                             PROGRESS NOTE                                                                                                                                                                                                             Patient Demographics:    [name], is a 81 y.o. male, DOB - 1941-08-15, PYP:[id number]  Outpatient Primary MD for the patient is [name], MD    LOS - 3  Admit date - 11/02/2021    Chief Complaint  Patient presents with   Abnormal Blood Test Results       Brief Narrative    [name] is a 81 y.o. male with medical history significant of diastolic CHF, diabetes mellitus type 2, NASH related cirrhosis with ascites, splenomegaly, thrombocytopenia, hepatocellular carcinoma s/p SBRT presents after being advised to come to the hospital for abnormal test results.  He had paracentesis day prior to admission with 6 L of fluid removed given 50 g of albumin x1 post paracentesis.  Cell count showed PMNs of 895 consistent with spontaneous bacterial peritonitis, so he was admitted for further work-up.   Subjective:    [name] today denies fever, chills, abdominal pain, nausea or vomiting.  Assessment  & Plan :    * Sepsis due to Spontaneous bacterial peritonitis ([place])- (present on admission) - Sepsis present on admission -  decompensated cirrhosis with ascites (acute on chronic): -He is with multiple paracentesis in the past, most recent 2 days prior to admission, -Continue fluid significant for 895 PMN concerning for SBP.  -Recommendation per GI to continue with IV Rocephin x5 days, then to switch to Septra DS.  For prophylaxis.  He is currently on cefepime due to Pseudomonas UTI. -Body fluid culture growing Clostridium perfringens, should be covered by Rocephin and cefepime -Dose adjustment of furosemide and spironolactone per GI  recommendation -Cytology with no evidence of  any atypical cells, only inflammatory cells -With known history of recurrent ascites, ascites appears to be reaccumulating, discussed with GI, okay to drain before discharge, will start on IV albumin tomorrow with anticipation of paracentesis tomorrow Of fluid has built up.    Acute lower UTI Urinalysis noted large leukocytes with rare bacteria, and 21-50 WBCs.  Antibiotics have been given prior to urine cultures being obtained. -Urine culture growing E. coli and Pseudomonas.  So he has been changed to cefepime during hospital stay. -Antibiotics as noted above  Cirrhosis of liver with ascites ([place])- (present on admission) - Decompensated liver failure, Lasix and Aldactone management per GI  -Follow-up with ultrasound liver with Doppler -Continue with 2 g sodium diet -We will plan for paracentesis tomorrow if fluid reaccumulate.  Thrombocytopenia ([place])- (present on admission) Due to liver cirrhosis, stable, at baseline, no evidence of bleed.  DM (diabetes mellitus) type II controlled, neurological manifestation ([place])- (present on admission) Diabetes has been well controlled not on any medications at this time.  Last hemoglobin A1c was 5.2 in 07/2021. -Hypoglycemic protocols -Controlled with A1c of 5.4. -CBGs before every meal with very sensitive sliding scale insulin -Discontinue CBG checks if blood sugars remain under 180  Essential hypertension- (present on admission) Home blood pressure regimen includes Coreg 6.25 mg twice daily, furosemide 40 mg daily, spironolactone 75 mg daily, and Entresto 24-26 mg twice daily -Continue current regimen as tolerated  Iron deficiency anemia due to chronic blood loss- (present on admission) Around baseline  BPH (benign prostatic hyperplasia)- (present on admission) Continue Proscar and Flomax  Sepsis ([employer]) Please see above discussion under SBP.          Family Communication  : None at  bedside  Code Status :  Full  Consults  :  GI, Palliative  Disposition Plan  :home  Status is: Inpatient  Remains inpatient appropriate because: IV antibiotics      DVT Prophylaxis  :   SCDs , [place] heparin  Lab Results  Component Value Date   PLT 119 (L) 11/04/2021    Diet :  Diet Order             Diet 2 gram sodium Room service appropriate? Yes; Fluid consistency: Thin  Diet effective now                    Inpatient Medications  Scheduled Meds:  busPIRone  7.5 mg Oral QHS   carvedilol  6.25 mg Oral BID WC   escitalopram  5 mg Oral Daily   famotidine  20 mg Oral BID   feeding supplement  237 mL Oral TID BM   finasteride  5 mg Oral Daily   furosemide  40 mg Oral Daily   gabapentin  300 mg Oral BID   insulin aspart  0-6 Units Subcutaneous TID WC   multivitamin with minerals  1 tablet Oral Daily   polyvinyl alcohol  1 drop Both Eyes QHS   rosuvastatin  10 mg Oral Daily   sacubitril-valsartan  1 tablet Oral BID   sodium chloride flush  3 mL Intravenous Q12H   spironolactone  75 mg Oral Daily   tamsulosin  0.4 mg Oral Daily   Continuous Infusions:  ceFEPime (MAXIPIME) IV 1 g (11/06/21 0322)   PRN Meds:.acetaminophen **OR** acetaminophen, albuterol, loperamide, ondansetron **OR** ondansetron (ZOFRAN) IV  Antibiotics  :    Anti-infectives (From admission, onward)    Start     Dose/Rate Route Frequency Ordered Stop  11/05/21 1630  ceFEPIme (MAXIPIME) 1 g in sodium chloride 0.9 % 100 mL IVPB        1 g 200 mL/hr over 30 Minutes Intravenous Every 8 hours 11/05/21 1541     11/04/21 1200  cefTRIAXone (ROCEPHIN) 2 g in sodium chloride 0.9 % 100 mL IVPB  Status:  Discontinued        2 g 200 mL/hr over 30 Minutes Intravenous Every 24 hours 11/03/21 1424 11/05/21 1541   11/03/21 1215  cefTRIAXone (ROCEPHIN) 2 g in sodium chloride 0.9 % 100 mL IVPB        2 g 200 mL/hr over 30 Minutes Intravenous  Once 11/03/21 1213 11/03/21 1333          Norene Oliveri M.D on 11/06/2021 at 1:02 PM  To page go to [email or web address]   [facility] -  Office  [phone]       Objective:   Vitals:   11/05/21 1639 11/05/21 2038 11/06/21 0503 11/06/21 0728  BP: (!) 95/57 (!) 92/51 105/65 114/66  Pulse: 98 86 95 (!) 107  Resp: 18 18  18   Temp: 98.2 F (36.8 C) 98.2 F (36.8 C) 98.6 F (37 C) 98.7 F (37.1 C)  TempSrc: Oral Oral Oral Oral  SpO2: 100% 96% 94% 96%  Weight:   78.5 kg   Height:        Wt Readings from Last 3 Encounters:  11/06/21 78.5 kg  11/01/21 85.7 kg  10/22/21 83 kg     Intake/Output Summary (Last 24 hours) at 11/06/2021 1302 Last data filed at 11/06/2021 1114 Gross per 24 hour  Intake 957.6 ml  Output --  Net 957.6 ml     Physical Exam  Awake Alert, Oriented X 3, No new F.N deficits, Normal affect Symmetrical Chest wall movement, Good air movement bilaterally, CTAB RRR,No Gallops,Rubs or new Murmurs, No Parasternal Heave +ve B.Sounds, Abd Soft, mild ascites, No rebound - guarding or rigidity. No Cyanosis, Clubbing or edema, No new Rash or bruise        Data Review:    CBC Recent Labs  Lab 11/02/21 2105 11/03/21 1804 11/04/21 0030 11/04/21 0419  WBC 5.2  --   --  5.2  HGB 8.8* 7.9* 9.0* 8.8*  HCT 27.6* 24.7* 28.1* 26.6*  PLT 146*  --   --  119*  MCV 106.6*  --   --  101.5*  MCH 34.0  --   --  33.6  MCHC 31.9  --   --  33.1  RDW 16.4*  --   --  17.2*  LYMPHSABS 0.5*  --   --   --   MONOABS 0.4  --   --   --   EOSABS 0.0  --   --   --   BASOSABS 0.0  --   --   --     Recent Labs  Lab 11/02/21 2105 11/03/21 1245 11/03/21 1804 11/04/21 0419  NA 137  --   --  137  K 4.1  --   --  4.3  CL 101  --   --  104  CO2 28  --   --  26  GLUCOSE 194*  --   --  103*  BUN 22  --   --  17  CREATININE 1.16  --   --  0.96  CALCIUM 8.5*  --   --  8.5*  AST 47*  --   --   --   ALT  25  --   --   --   ALKPHOS 240*  --   --   --   BILITOT 0.9  --   --   --   ALBUMIN 2.7*  --   --   --    LATICACIDVEN 2.5* 1.3 1.3  --   INR  --   --  1.3*  --   HGBA1C  --   --  5.4  --     ------------------------------------------------------------------------------------------------------------------ No results for input(s): CHOL, HDL, LDLCALC, TRIG, CHOLHDL, LDLDIRECT in the last 72 hours.  Lab Results  Component Value Date   HGBA1C 5.4 11/03/2021   ------------------------------------------------------------------------------------------------------------------ No results for input(s): TSH, T4TOTAL, T3FREE, THYROIDAB in the last 72 hours.  Invalid input(s): FREET3  Cardiac Enzymes No results for input(s): CKMB, TROPONINI, MYOGLOBIN in the last 168 hours.  Invalid input(s): CK ------------------------------------------------------------------------------------------------------------------    Component Value Date/Time   BNP 927.6 (H) 12/03/2019 1800    Micro Results Recent Results (from the past 240 hour(s))  Culture, body fluid w Gram Stain-bottle     Status: Abnormal   Collection Time: 11/02/21  1:23 PM   Specimen: Peritoneal Washings  Result Value Ref Range Status   Specimen Description PERITONEAL  Final   Special Requests ABDOMEN  Final   Gram Stain   Final    WBC PRESENT,BOTH PMN AND MONONUCLEAR CYTOSPIN SMEAR CRITICAL RESULT CALLED TO, READ BACK BY AND VERIFIED WITH: BOBBY,RN@[id number]  11/03/21 [place] GRAM POSITIVE RODS ANAEROBIC BOTTLE ONLY Performed at [facility], [place] [address] [place], [place] [address]    Culture CLOSTRIDIUM PERFRINGENS (A)  Final   Report Status 11/05/2021 FINAL  Final  Blood culture (routine x 2)     Status: None (Preliminary result)   Collection Time: 11/02/21  9:00 PM   Specimen: BLOOD LEFT ARM  Result Value Ref Range Status   Specimen Description BLOOD LEFT ARM  Final   Special Requests   Final    BOTTLES DRAWN AEROBIC ONLY Blood Culture adequate volume   Culture   Final    NO GROWTH 4 DAYS Performed at [facility], [place] [address], [place], [place] [address]    Report Status PENDING  Incomplete  Urine Culture     Status: Abnormal   Collection Time: 11/02/21  9:25 PM   Specimen: Urine, Clean Catch  Result Value Ref Range Status   Specimen Description URINE, CLEAN CATCH  Final   Special Requests   Final    NONE Performed at [facility], [place] [address], [place],  [address]    Culture (A)  Final    >=100,000 COLONIES/mL ESCHERICHIA COLI 50,000 COLONIES/mL PSEUDOMONAS AERUGINOSA    Report Status 11/06/2021 FINAL  Final   Organism ID, Bacteria ESCHERICHIA COLI (A)  Final   Organism ID, Bacteria PSEUDOMONAS AERUGINOSA (A)  Final      Susceptibility   Escherichia coli - MIC*    AMPICILLIN <=2 SENSITIVE Sensitive     CEFAZOLIN <=4 SENSITIVE Sensitive     CEFEPIME <=0.12 SENSITIVE Sensitive     CEFTRIAXONE <=0.25 SENSITIVE Sensitive     CIPROFLOXACIN <=0.25 SENSITIVE Sensitive     GENTAMICIN <=1 SENSITIVE Sensitive     IMIPENEM <=0.25 SENSITIVE Sensitive     NITROFURANTOIN <=16 SENSITIVE Sensitive     TRIMETH/SULFA <=20 SENSITIVE Sensitive     AMPICILLIN/SULBACTAM <=2 SENSITIVE Sensitive     PIP/TAZO <=4 SENSITIVE Sensitive     * >=100,000 COLONIES/mL ESCHERICHIA COLI  Pseudomonas aeruginosa - MIC*    CEFTAZIDIME <=1 SENSITIVE Sensitive     CIPROFLOXACIN <=0.25 SENSITIVE Sensitive     GENTAMICIN <=1 SENSITIVE Sensitive     IMIPENEM 1 SENSITIVE Sensitive     PIP/TAZO <=4 SENSITIVE Sensitive     CEFEPIME 0.5 SENSITIVE Sensitive     * 50,000 COLONIES/mL PSEUDOMONAS AERUGINOSA  Resp Panel by RT-PCR (Flu A&B, Covid) Peripheral     Status: None   Collection Time: 11/02/21  9:35 PM   Specimen: Peripheral; Nasopharyngeal(NP) swabs in vial transport medium  Result Value Ref Range Status   SARS Coronavirus 2 by RT PCR NEGATIVE NEGATIVE Final    Comment: (NOTE) SARS-CoV-2 target nucleic acids are NOT DETECTED.  The SARS-CoV-2 RNA is generally detectable in upper respiratory specimens  during the acute phase of infection. The lowest concentration of SARS-CoV-2 viral copies this assay can detect is 138 copies/mL. A negative result does not preclude SARS-Cov-2 infection and should not be used as the sole basis for treatment or other patient management decisions. A negative result may occur with  improper specimen collection/handling, submission of specimen other than nasopharyngeal swab, presence of viral mutation(s) within the areas targeted by this assay, and inadequate number of viral copies(<138 copies/mL). A negative result must be combined with clinical observations, patient history, and epidemiological information. The expected result is Negative.  Fact Sheet for Patients:  EntrepreneurPulse.com.au  Fact Sheet for Healthcare Providers:  IncredibleEmployment.be  This test is no t yet approved or cleared by the [place] FDA and  has been authorized for detection and/or diagnosis of SARS-CoV-2 by FDA under an Emergency Use Authorization (EUA). This EUA will remain  in effect (meaning this test can be used) for the duration of the COVID-19 declaration under Section 564(b)(1) of the Act, 21 U.S.C.section 360bbb-3(b)(1), unless the authorization is terminated  or revoked sooner.       Influenza A by PCR NEGATIVE NEGATIVE Final   Influenza B by PCR NEGATIVE NEGATIVE Final    Comment: (NOTE) The Xpert Xpress SARS-CoV-2/FLU/RSV plus assay is intended as an aid in the diagnosis of influenza from Nasopharyngeal swab specimens and should not be used as a sole basis for treatment. Nasal washings and aspirates are unacceptable for Xpert Xpress SARS-CoV-2/FLU/RSV testing.  Fact Sheet for Patients: EntrepreneurPulse.com.au  Fact Sheet for Healthcare Providers: IncredibleEmployment.be  This test is not yet approved or cleared by the [place] FDA and has been authorized for detection  and/or diagnosis of SARS-CoV-2 by FDA under an Emergency Use Authorization (EUA). This EUA will remain in effect (meaning this test can be used) for the duration of the COVID-19 declaration under Section 564(b)(1) of the Act, 21 U.S.C. section 360bbb-3(b)(1), unless the authorization is terminated or revoked.  Performed at [facility], [place] [address], [place], [place] [address]   Blood culture (routine x 2)     Status: None (Preliminary result)   Collection Time: 11/02/21  9:45 PM   Specimen: BLOOD RIGHT ARM  Result Value Ref Range Status   Specimen Description BLOOD RIGHT ARM  Final   Special Requests   Final    BOTTLES DRAWN AEROBIC AND ANAEROBIC Blood Culture adequate volume   Culture   Final    NO GROWTH 4 DAYS Performed at [facility], [place] [address], [place], [place] [address]    Report Status PENDING  Incomplete    Radiology Reports DG Chest 2 View  Result Date: 11/03/2021 CLINICAL DATA:  Persistent cough EXAM:  CHEST - 2 VIEW COMPARISON:  Chest x-ray 07/15/2021 FINDINGS: Heart size is within normal limits. Calcified plaques in the aortic arch. Chronic mildly prominent interstitial lung markings bilaterally. No focal consolidation identified. No pleural effusion or pneumothorax visualized. IMPRESSION: No acute intrathoracic process identified. Electronically Signed   By: Ofilia Neas M.D.   On: 11/03/2021 13:28   US LIVER DOPPLER  Result Date: 11/05/2021 CLINICAL DATA:  Ascites. History of hepatocellular carcinoma. Placement of fiducial markers in liver for treatment. EXAM: DUPLEX ULTRASOUND OF LIVER TECHNIQUE: Color and duplex Doppler ultrasound was performed to evaluate the hepatic in-flow and out-flow vessels. COMPARISON:  CT 07/12/2021 FINDINGS: Liver: Limited evaluation of the liver. Main Portal Vein size: 1.7 cm Portal Vein Velocities Main Prox:  19 cm/sec Main Mid: 17 cm/sec Main Dist:  13 cm/sec Right: 9 cm/sec Left: 16 cm/sec Hepatic Vein Velocities  Right:  33 cm/sec Middle:  20 cm/sec Left:  23 cm/sec IVC: Present and patent with normal respiratory phasicity. Hepatic Artery Velocity:  47 cm/sec Splenic Vein Velocity:  19 cm/sec Spleen: 12.4 cm x 6.1 cm x 4.4 cm with a total volume of 173 cm^3 (411 cm^3 is upper limit normal) Portal Vein Occlusion/Thrombus: No Splenic Vein Occlusion/Thrombus: No Ascites: Present Varices: None Normal hepatopetal flow in the portal veins. Normal hepatofugal flow in the hepatic veins. Perihepatic ascites. Perisplenic ascites. IMPRESSION: 1. Portal venous system is patent with normal direction of flow. 2. Ascites. Electronically Signed   By: Markus Daft M.D.   On: 11/05/2021 08:58   IR Paracentesis  Result Date: 11/02/2021 INDICATION: Patient with history of cirrhosis and hepatocellular carcinoma with recurrent ascites. Request is for therapeutic paracentesis. 7 L maximum EXAM: ULTRASOUND GUIDED THERAPEUTIC PARACENTESIS MEDICATIONS: Lidocaine 1% 10 mL COMPLICATIONS: None immediate. PROCEDURE: Informed written consent was obtained from the patient after a discussion of the risks, benefits and alternatives to treatment. A timeout was performed prior to the initiation of the procedure. Initial ultrasound scanning demonstrates a large amount of ascites within the right lower abdominal quadrant. The right lower abdomen was prepped and draped in the usual sterile fashion. 1% lidocaine was used for local anesthesia. Following this, a 19 gauge, 7-cm, Yueh catheter was introduced. An ultrasound image was saved for documentation purposes. The paracentesis was performed. The catheter was removed and a dressing was applied. The patient tolerated the procedure well without immediate post procedural complication. Patient received post-procedure intravenous albumin; see nursing notes for details. FINDINGS: A total of approximately 6 of straw-colored fluid was removed. IMPRESSION: Successful ultrasound-guided therapeutic paracentesis yielding 6  liters of peritoneal fluid. Read by: Rushie Nyhan, NP Electronically Signed   By: Corrie Mckusick D.O.   On: 11/02/2021 15:37   IR Paracentesis  Result Date: 10/12/2021 INDICATION: Patient with a history of cirrhosis and hepatocellular carcinoma presents today with ascites. Interventional radiology asked to perform a diagnostic and therapeutic paracentesis up to 7 L. EXAM: ULTRASOUND GUIDED PARACENTESIS MEDICATIONS: 1% lidocaine 10 mL COMPLICATIONS: None immediate. PROCEDURE: Informed written consent was obtained from the patient after a discussion of the risks, benefits and alternatives to treatment. A timeout was performed prior to the initiation of the procedure. Initial ultrasound scanning demonstrates a large amount of ascites within the right lower abdominal quadrant. The right lower abdomen was prepped and draped in the usual sterile fashion. 1% lidocaine was used for local anesthesia. Following this, a 19 gauge, 7-cm, Yueh catheter was introduced. An ultrasound image was saved for documentation purposes. The paracentesis was performed. The catheter  was removed and a dressing was applied. The patient tolerated the procedure well without immediate post procedural complication. Patient received post-procedure intravenous albumin; see nursing notes for details. FINDINGS: A total of approximately 6.8 L of clear yellow fluid was removed. Samples were sent to the laboratory as requested by the clinical team. IMPRESSION: Successful ultrasound-guided paracentesis yielding 6.8 liters of peritoneal fluid. Read by: Soyla Dryer, NP Electronically Signed   By: Aletta Edouard M.D.   On: 10/12/2021 16:46                                          Patient ID: [name], male   DOB: Mar 20, 1941, 81 y.o.   MRN: [id number] Patient ID: [name], male   DOB: 25-Jan-1941, 81 y.o.   MRN: [id number] Patient ID: [name], male   DOB: 1941-01-03, 81 y.o.   MRN: [id number]

## 2021-11-06 NOTE — Assessment & Plan Note (Signed)
Due to liver cirrhosis, stable, at baseline, no evidence of bleed.

## 2021-11-07 ENCOUNTER — Inpatient Hospital Stay (HOSPITAL_COMMUNITY): Payer: Medicare HMO

## 2021-11-07 LAB — CULTURE, BLOOD (ROUTINE X 2)
Culture: NO GROWTH
Culture: NO GROWTH
Special Requests: ADEQUATE
Special Requests: ADEQUATE

## 2021-11-07 LAB — GLUCOSE, CAPILLARY
Glucose-Capillary: 115 mg/dL — ABNORMAL HIGH (ref 70–99)
Glucose-Capillary: 227 mg/dL — ABNORMAL HIGH (ref 70–99)

## 2021-11-07 MED ORDER — SPIRONOLACTONE 25 MG PO TABS: 75.0000 mg | ORAL_TABLET | Freq: Every day | ORAL | Status: DC

## 2021-11-07 MED ORDER — CARVEDILOL 6.25 MG PO TABS: 6.2500 mg | ORAL_TABLET | Freq: Two times a day (BID) | ORAL | Status: DC

## 2021-11-07 MED ORDER — LIDOCAINE HCL (PF) 1 % IJ SOLN
INTRAMUSCULAR | Status: AC
  Filled 2021-11-07: qty 30

## 2021-11-07 MED ORDER — SULFAMETHOXAZOLE-TRIMETHOPRIM 800-160 MG PO TABS: 1.0000 | ORAL_TABLET | Freq: Every day | ORAL | 0 refills | Status: DC

## 2021-11-07 MED ORDER — FUROSEMIDE 40 MG PO TABS: 40.0000 mg | ORAL_TABLET | Freq: Every day | ORAL | Status: DC

## 2021-11-07 MED ORDER — SACUBITRIL-VALSARTAN 24-26 MG PO TABS: 1.0000 | ORAL_TABLET | Freq: Two times a day (BID) | ORAL | Status: DC

## 2021-11-07 NOTE — Discharge Instructions (Signed)
Follow with Primary MD [name], MD in 7 days   Get CBC, CMP,  checked  by Primary MD next visit.    Activity: As tolerated with Full fall precautions use walker/cane & assistance as needed   Disposition Home    Diet: strict 2 gm salt diet   On your next visit with your primary care physician please Get Medicines reviewed and adjusted.   Please request your Prim.MD to go over all Hospital Tests and Procedure/Radiological results at the follow up, please get all Hospital records sent to your Prim MD by signing hospital release before you go home.   If you experience worsening of your admission symptoms, develop shortness of breath, life threatening emergency, suicidal or homicidal thoughts you must seek medical attention immediately by calling 911 or calling your MD immediately  if symptoms less severe.  You Must read complete instructions/literature along with all the possible adverse reactions/side effects for all the Medicines you take and that have been prescribed to you. Take any new Medicines after you have completely understood and accpet all the possible adverse reactions/side effects.   Do not drive, operating heavy machinery, perform activities at heights, swimming or participation in water activities or provide baby sitting services if your were admitted for syncope or siezures until you have seen by Primary MD or a Neurologist and advised to do so again.  Do not drive when taking Pain medications.    Do not take more than prescribed Pain, Sleep and Anxiety Medications  Special Instructions: If you have smoked or chewed Tobacco  in the last 2 yrs please stop smoking, stop any regular Alcohol  and or any Recreational drug use.  Wear Seat belts while driving.   Please note  You were cared for by a hospitalist during your hospital stay. If you have any questions about your discharge medications or the care you received while you were in the hospital after you are  discharged, you can call the unit and asked to speak with the hospitalist on call if the hospitalist that took care of you is not available. Once you are discharged, your primary care physician will handle any further medical issues. Please note that NO REFILLS for any discharge medications will be authorized once you are discharged, as it is imperative that you return to your primary care physician (or establish a relationship with a primary care physician if you do not have one) for your aftercare needs so that they can reassess your need for medications and monitor your lab values.

## 2021-11-07 NOTE — Progress Notes (Signed)
Patient discharged with all belongings at bedside.  Patient alert and oriented x 4 on room air.  Patient taken to private family vehicle via wheelchair by RN and accompanied by patients' wife and daughter.  AVS instructions given to patient, patient spouse and daughter.  Prescriptions for abx reviewed with patient including pharmacy pickup location.  Patient and family voiced understanding with no additional questions.

## 2021-11-07 NOTE — Discharge Summary (Signed)
Physician Discharge Summary  BREYDAN SHILLINGBURG NWG:[id number] DOB: May 31, 1941 DOA: 11/02/2021  PCP: [name], MD  Admit date: 11/02/2021 Discharge date: 11/07/2021  Admitted From: Home Disposition:  Home  Recommendations for Outpatient Follow-up:  Follow up with PCP in 1-2 weeks Please obtain BMP/CBC in one week keep your appointment with GI on 11/10/2021  Home Health:NO  Discharge Condition:Stable CODE STATUS: DNR Diet recommendation: Heart Healthy / 2 GM salt diet   Brief/Interim Summary:  HOLLY PRING is a 81 y.o. male with medical history significant of diastolic CHF, diabetes mellitus type 2, NASH related cirrhosis with ascites, splenomegaly, thrombocytopenia, hepatocellular carcinoma s/p SBRT presents after being advised to come to the hospital for abnormal test results.  He had paracentesis day prior to admission with 6 L of fluid removed given 50 g of albumin x1 post paracentesis.  Cell count showed PMNs of 895 consistent with spontaneous bacterial peritonitis, so he was admitted for further work-up.  Sepsis due to Spontaneous bacterial peritonitis ([employer])- (present on admission) - Sepsis present on admission, resolved by time of discharge -  decompensated cirrhosis with ascites (acute on chronic): -He is with multiple paracentesis in the past, most recent 2 days prior to admission, as well patient received paracentesis at the time of discharge with 4 L drained on 11/07/2020. -Work-up significant for SBP, PMN of 895 on paracentesis which was done as an outpatient,. -Recommendation per GI to continue with IV Rocephin x5 days, then to switch to Septra DS.  For prophylaxis.  Was treated with Rocephin, and cefepime during hospital stay, and he will be discharged on Septra DS 1 tablet daily per GI recommendation.  -Body fluid culture growing Clostridium perfringens, should be covered by Rocephin and cefepime -Continue with Aldactone and Lasix. -Patient received albumin during  hospital stay, received total of 4 doses over last 24 hours given current paracentesis. -Cytology with no evidence of any atypical cells, only inflammatory cells    Acute lower UTI Urinalysis noted large leukocytes with rare bacteria, and 21-50 WBCs.  Antibiotics have been given prior to urine cultures being obtained. -Urine culture growing E. coli and Pseudomonas.  So he has been changed to cefepime during hospital stay. -Antibiotics as noted above   Cirrhosis of liver with ascites ([place])- (present on admission) - Decompensated liver failure, Lasix and Aldactone management per GI  -Follow-up with ultrasound liver with Doppler -Continue with 2 g sodium diet -Received paracentesis today with 4 L drained.   Thrombocytopenia ([place])- (present on admission) Due to liver cirrhosis, stable, at baseline, no evidence of bleed.   DM (diabetes mellitus) type II controlled, neurological manifestation ([place])- (present on admission) Diabetes has been well controlled not on any medications at this time.  Last hemoglobin A1c was 5.2 in 07/2021. -Controlled with A1c of 5.4. -CBGs before every meal with very sensitive sliding scale insulin   Essential hypertension- (present on admission) Home blood pressure regimen includes Coreg 6.25 mg twice daily, furosemide 40 mg daily, spironolactone 75 mg daily, and Entresto 24-26 mg twice daily   Iron deficiency anemia due to chronic blood loss- (present on admission) Around baseline   BPH (benign prostatic hyperplasia)- (present on admission) Continue Proscar and Flomax   Sepsis ([place]) Please see above discussion under SBP.                Discharge Diagnoses:  Principal Problem:   Sepsis due to Spontaneous bacterial peritonitis ([place]) Active Problems:   Acute lower UTI   Cirrhosis of liver  with ascites ([place])   Thrombocytopenia (HCC)   DM (diabetes mellitus) type II controlled, neurological manifestation (HCC)   Essential hypertension   BPH (benign  prostatic hyperplasia)   Iron deficiency anemia due to chronic blood loss   Sepsis (HCC)   COPD (chronic obstructive pulmonary disease) (HCC)   Macrocytic anemia   Anxiety   Hepatocellular carcinoma (HCC)   Chronic diarrhea   Other ascites   Protein-calorie malnutrition, severe    Discharge Instructions  Discharge Instructions     Diet - low sodium heart healthy   Complete by: As directed    Discharge instructions   Complete by: As directed    Follow with Primary MD [name], MD in 7 days   Get CBC, CMP,  checked  by Primary MD next visit.    Activity: As tolerated with Full fall precautions use walker/cane & assistance as needed   Disposition Home    Diet: strict 2 gm salt diet   On your next visit with your primary care physician please Get Medicines reviewed and adjusted.   Please request your Prim.MD to go over all Hospital Tests and Procedure/Radiological results at the follow up, please get all Hospital records sent to your Prim MD by signing hospital release before you go home.   If you experience worsening of your admission symptoms, develop shortness of breath, life threatening emergency, suicidal or homicidal thoughts you must seek medical attention immediately by calling 911 or calling your MD immediately  if symptoms less severe.  You Must read complete instructions/literature along with all the possible adverse reactions/side effects for all the Medicines you take and that have been prescribed to you. Take any new Medicines after you have completely understood and accpet all the possible adverse reactions/side effects.   Do not drive, operating heavy machinery, perform activities at heights, swimming or participation in water activities or provide baby sitting services if your were admitted for syncope or siezures until you have seen by Primary MD or a Neurologist and advised to do so again.  Do not drive when taking Pain medications.    Do not  take more than prescribed Pain, Sleep and Anxiety Medications  Special Instructions: If you have smoked or chewed Tobacco  in the last 2 yrs please stop smoking, stop any regular Alcohol  and or any Recreational drug use.  Wear Seat belts while driving.   Please note  You were cared for by a hospitalist during your hospital stay. If you have any questions about your discharge medications or the care you received while you were in the hospital after you are discharged, you can call the unit and asked to speak with the hospitalist on call if the hospitalist that took care of you is not available. Once you are discharged, your primary care physician will handle any further medical issues. Please note that NO REFILLS for any discharge medications will be authorized once you are discharged, as it is imperative that you return to your primary care physician (or establish a relationship with a primary care physician if you do not have one) for your aftercare needs so that they can reassess your need for medications and monitor your lab values.   Increase activity slowly   Complete by: As directed       Allergies as of 11/07/2021   No Known Allergies      Medication List     STOP taking these medications    acetaminophen 500 MG tablet Commonly  known as: TYLENOL   oxyCODONE 5 MG immediate release tablet Commonly known as: Roxicodone       TAKE these medications    albuterol 108 (90 Base) MCG/ACT inhaler Commonly known as: VENTOLIN HFA Inhale 2 puffs into the lungs every 6 (six) hours as needed for wheezing or shortness of breath.   aspirin EC 81 MG tablet Take 81 mg by mouth daily.   busPIRone 7.5 MG tablet Commonly known as: BUSPAR Take 1 tablet (7.5 mg total) by mouth 2 (two) times daily as needed. for anxiety What changed:  when to take this additional instructions   carvedilol 6.25 MG tablet Commonly known as: COREG Take 1 tablet (6.25 mg total) by mouth 2 (two) times  daily with a meal.   DropSafe Alcohol Prep 70 % Pads USE  UP  TO FOUR TIMES DAILY AS DIRECTED   Entresto 24-26 MG Generic drug: sacubitril-valsartan Take 1 tablet by mouth 2 (two) times daily.   escitalopram 5 MG tablet Commonly known as: LEXAPRO Take 1 tablet (5 mg total) by mouth daily.   famotidine 20 MG tablet Commonly known as: PEPCID Take 20 mg by mouth 2 (two) times daily.   finasteride 5 MG tablet Commonly known as: PROSCAR TAKE 1 TABLET EVERY DAY   furosemide 20 MG tablet Commonly known as: LASIX Take 2 tablets (40 mg total) by mouth daily.   gabapentin 300 MG capsule Commonly known as: NEURONTIN TAKE 1 CAPSULE TWICE DAILY   loperamide 2 MG capsule Commonly known as: IMODIUM Take 1 capsule (2 mg total) by mouth 3 (three) times daily between meals as needed for diarrhea or loose stools.   multivitamin with minerals Tabs tablet Take 1 tablet by mouth daily.   REFRESH OP Place 1 drop into both eyes at bedtime.   rosuvastatin 10 MG tablet Commonly known as: CRESTOR TAKE 1 TABLET EVERY DAY What changed:  how much to take how to take this when to take this   spironolactone 50 MG tablet Commonly known as: Aldactone Take 1.5 tablets (75 mg total) by mouth daily.   sulfamethoxazole-trimethoprim 800-160 MG tablet Commonly known as: BACTRIM DS Take 1 tablet by mouth daily.   tamsulosin 0.4 MG Caps capsule Commonly known as: FLOMAX TAKE 1 CAPSULE EVERY DAY   True Metrix Air Glucose Meter w/Device Kit Use to check blood sugars daily. Dx: E11.9   True Metrix Blood Glucose Test test strip Generic drug: glucose blood TEST BLOOD SUGAR  UP  TO FOUR TIMES DAILY AS DIRECTED   TRUEplus Lancets 28G Misc TEST BLOOD SUGAR  UP  TO FOUR TIMES DAILY AS DIRECTED        Follow-up Information     [name], MD Follow up.   Specialty: Family Medicine Contact information: [place] [address] [phone]         Jerene Bears, MD Follow up.   Specialty: Gastroenterology Why: Keep your appointment on 11/10/2021 Contact information: [address] [address] [phone]                No Known Allergies  Consultations: GI   Procedures/Studies: DG Chest 2 View  Result Date: 11/03/2021 CLINICAL DATA:  Persistent cough EXAM: CHEST - 2 VIEW COMPARISON:  Chest x-ray 07/15/2021 FINDINGS: Heart size is within normal limits. Calcified plaques in the aortic arch. Chronic mildly prominent interstitial lung markings bilaterally. No focal consolidation identified. No pleural effusion or pneumothorax visualized. IMPRESSION: No acute intrathoracic  process identified. Electronically Signed   By: Ofilia Neas M.D.   On: 11/03/2021 13:28   US Paracentesis  Result Date: 11/07/2021 INDICATION: Recurrent ascites EXAM: ULTRASOUND GUIDED LLQ PARACENTESIS MEDICATIONS: 10 cc 1% lidocaine. COMPLICATIONS: None immediate. PROCEDURE: Informed written consent was obtained from the patient after a discussion of the risks, benefits and alternatives to treatment. A timeout was performed prior to the initiation of the procedure. Initial ultrasound scanning demonstrates a large amount of ascites within the LEFT lower abdominal quadrant. The LEFT lower abdomen was prepped and draped in the usual sterile fashion. 1% lidocaine was used for local anesthesia. Following this, a Yueh catheter was introduced. An ultrasound image was saved for documentation purposes. The paracentesis was performed. The catheter was removed and a dressing was applied. The patient tolerated the procedure well without immediate post procedural complication. FINDINGS: A total of approximately 4 liters of yellow fluid was removed. IMPRESSION: Successful ultrasound-guided paracentesis yielding 4 liters maximum per MD of peritoneal fluid. Read by Lavonia Drafts [facility] Electronically Signed   By: Michaelle Birks M.D.   On: 11/07/2021 09:22   US LIVER  DOPPLER  Result Date: 11/05/2021 CLINICAL DATA:  Ascites. History of hepatocellular carcinoma. Placement of fiducial markers in liver for treatment. EXAM: DUPLEX ULTRASOUND OF LIVER TECHNIQUE: Color and duplex Doppler ultrasound was performed to evaluate the hepatic in-flow and out-flow vessels. COMPARISON:  CT 07/12/2021 FINDINGS: Liver: Limited evaluation of the liver. Main Portal Vein size: 1.7 cm Portal Vein Velocities Main Prox:  19 cm/sec Main Mid: 17 cm/sec Main Dist:  13 cm/sec Right: 9 cm/sec Left: 16 cm/sec Hepatic Vein Velocities Right:  33 cm/sec Middle:  20 cm/sec Left:  23 cm/sec IVC: Present and patent with normal respiratory phasicity. Hepatic Artery Velocity:  47 cm/sec Splenic Vein Velocity:  19 cm/sec Spleen: 12.4 cm x 6.1 cm x 4.4 cm with a total volume of 173 cm^3 (411 cm^3 is upper limit normal) Portal Vein Occlusion/Thrombus: No Splenic Vein Occlusion/Thrombus: No Ascites: Present Varices: None Normal hepatopetal flow in the portal veins. Normal hepatofugal flow in the hepatic veins. Perihepatic ascites. Perisplenic ascites. IMPRESSION: 1. Portal venous system is patent with normal direction of flow. 2. Ascites. Electronically Signed   By: Markus Daft M.D.   On: 11/05/2021 08:58   IR Paracentesis  Result Date: 11/02/2021 INDICATION: Patient with history of cirrhosis and hepatocellular carcinoma with recurrent ascites. Request is for therapeutic paracentesis. 7 L maximum EXAM: ULTRASOUND GUIDED THERAPEUTIC PARACENTESIS MEDICATIONS: Lidocaine 1% 10 mL COMPLICATIONS: None immediate. PROCEDURE: Informed written consent was obtained from the patient after a discussion of the risks, benefits and alternatives to treatment. A timeout was performed prior to the initiation of the procedure. Initial ultrasound scanning demonstrates a large amount of ascites within the right lower abdominal quadrant. The right lower abdomen was prepped and draped in the usual sterile fashion. 1% lidocaine was used  for local anesthesia. Following this, a 19 gauge, 7-cm, Yueh catheter was introduced. An ultrasound image was saved for documentation purposes. The paracentesis was performed. The catheter was removed and a dressing was applied. The patient tolerated the procedure well without immediate post procedural complication. Patient received post-procedure intravenous albumin; see nursing notes for details. FINDINGS: A total of approximately 6 of straw-colored fluid was removed. IMPRESSION: Successful ultrasound-guided therapeutic paracentesis yielding 6 liters of peritoneal fluid. Read by: Rushie Nyhan, NP Electronically Signed   By: Corrie Mckusick D.O.   On: 11/02/2021 15:37   IR Paracentesis  Result Date:  10/12/2021 INDICATION: Patient with a history of cirrhosis and hepatocellular carcinoma presents today with ascites. Interventional radiology asked to perform a diagnostic and therapeutic paracentesis up to 7 L. EXAM: ULTRASOUND GUIDED PARACENTESIS MEDICATIONS: 1% lidocaine 10 mL COMPLICATIONS: None immediate. PROCEDURE: Informed written consent was obtained from the patient after a discussion of the risks, benefits and alternatives to treatment. A timeout was performed prior to the initiation of the procedure. Initial ultrasound scanning demonstrates a large amount of ascites within the right lower abdominal quadrant. The right lower abdomen was prepped and draped in the usual sterile fashion. 1% lidocaine was used for local anesthesia. Following this, a 19 gauge, 7-cm, Yueh catheter was introduced. An ultrasound image was saved for documentation purposes. The paracentesis was performed. The catheter was removed and a dressing was applied. The patient tolerated the procedure well without immediate post procedural complication. Patient received post-procedure intravenous albumin; see nursing notes for details. FINDINGS: A total of approximately 6.8 L of clear yellow fluid was removed. Samples were sent to the  laboratory as requested by the clinical team. IMPRESSION: Successful ultrasound-guided paracentesis yielding 6.8 liters of peritoneal fluid. Read by: Soyla Dryer, NP Electronically Signed   By: Aletta Edouard M.D.   On: 10/12/2021 16:46      Subjective: Patient  denies any nausea, vomiting or abdominal pain, reports he feels better after paracentesis this morning.  Discharge Exam: Vitals:   11/07/21 0910 11/07/21 0946  BP: 109/62 113/60  Pulse:  98  Resp:  18  Temp:  98 F (36.7 C)  SpO2:  96%   Vitals:   11/07/21 0900 11/07/21 0908 11/07/21 0910 11/07/21 0946  BP: 106/63 101/61 109/62 113/60  Pulse:    98  Resp:    18  Temp:    98 F (36.7 C)  TempSrc:    Oral  SpO2:    96%  Weight:      Height:        General: Pt is alert, awake, not in acute distress Cardiovascular: RRR, S1/S2 +, no rubs, no gallops Respiratory: CTA bilaterally, no wheezing, no rhonchi Abdominal: Soft, NT, ND, bowel sounds +, minimal ascites, abdomen nontender. Extremities: no edema, no cyanosis    The results of significant diagnostics from this hospitalization (including imaging, microbiology, ancillary and laboratory) are listed below for reference.     Microbiology: Recent Results (from the past 240 hour(s))  Culture, body fluid w Gram Stain-bottle     Status: Abnormal   Collection Time: 11/02/21  1:23 PM   Specimen: Peritoneal Washings  Result Value Ref Range Status   Specimen Description PERITONEAL  Final   Special Requests ABDOMEN  Final   Gram Stain   Final    WBC PRESENT,BOTH PMN AND MONONUCLEAR CYTOSPIN SMEAR CRITICAL RESULT CALLED TO, READ BACK BY AND VERIFIED WITH: BOBBY,RN_[id number]  11/03/21 [place] GRAM POSITIVE RODS ANAEROBIC BOTTLE ONLY Performed at [facility], [address], [place], [place] [address]    Culture CLOSTRIDIUM PERFRINGENS (A)  Final   Report Status 11/05/2021 FINAL  Final  Blood culture (routine x 2)     Status: None   Collection Time: 11/02/21  9:00 PM    Specimen: BLOOD LEFT ARM  Result Value Ref Range Status   Specimen Description BLOOD LEFT ARM  Final   Special Requests   Final    BOTTLES DRAWN AEROBIC ONLY Blood Culture adequate volume   Culture   Final    NO GROWTH 5 DAYS Performed at [facility], [place] [address], [place], [place] [address]    Report Status 11/07/2021 FINAL  Final  Urine Culture     Status: Abnormal   Collection Time: 11/02/21  9:25 PM   Specimen: Urine, Clean Catch  Result Value Ref Range Status   Specimen Description URINE, CLEAN CATCH  Final   Special Requests   Final    NONE Performed at [facility], [place] [address], [place], [place] [address]    Culture (A)  Final    >=100,000 COLONIES/mL ESCHERICHIA COLI 50,000 COLONIES/mL PSEUDOMONAS AERUGINOSA    Report Status 11/06/2021 FINAL  Final   Organism ID, Bacteria ESCHERICHIA COLI (A)  Final   Organism ID, Bacteria PSEUDOMONAS AERUGINOSA (A)  Final      Susceptibility   Escherichia coli - MIC*    AMPICILLIN <=2 SENSITIVE Sensitive     CEFAZOLIN <=4 SENSITIVE Sensitive     CEFEPIME <=0.12 SENSITIVE Sensitive     CEFTRIAXONE <=0.25 SENSITIVE Sensitive     CIPROFLOXACIN <=0.25 SENSITIVE Sensitive     GENTAMICIN <=1 SENSITIVE Sensitive     IMIPENEM <=0.25 SENSITIVE Sensitive     NITROFURANTOIN <=16 SENSITIVE Sensitive     TRIMETH/SULFA <=20 SENSITIVE Sensitive     AMPICILLIN/SULBACTAM <=2 SENSITIVE Sensitive     PIP/TAZO <=4 SENSITIVE Sensitive     * >=100,000 COLONIES/mL ESCHERICHIA COLI   Pseudomonas aeruginosa - MIC*    CEFTAZIDIME <=1 SENSITIVE Sensitive     CIPROFLOXACIN <=0.25 SENSITIVE Sensitive     GENTAMICIN <=1 SENSITIVE Sensitive     IMIPENEM 1 SENSITIVE Sensitive     PIP/TAZO <=4 SENSITIVE Sensitive     CEFEPIME 0.5 SENSITIVE Sensitive     * 50,000 COLONIES/mL PSEUDOMONAS AERUGINOSA  Resp Panel by RT-PCR (Flu A&B, Covid) Peripheral     Status: None   Collection Time: 11/02/21  9:35 PM   Specimen: Peripheral;  Nasopharyngeal(NP) swabs in vial transport medium  Result Value Ref Range Status   SARS Coronavirus 2 by RT PCR NEGATIVE NEGATIVE Final    Comment: (NOTE) SARS-CoV-2 target nucleic acids are NOT DETECTED.  The SARS-CoV-2 RNA is generally detectable in upper respiratory specimens during the acute phase of infection. The lowest concentration of SARS-CoV-2 viral copies this assay can detect is 138 copies/mL. A negative result does not preclude SARS-Cov-2 infection and should not be used as the sole basis for treatment or other patient management decisions. A negative result may occur with  improper specimen collection/handling, submission of specimen other than nasopharyngeal swab, presence of viral mutation(s) within the areas targeted by this assay, and inadequate number of viral copies(<138 copies/mL). A negative result must be combined with clinical observations, patient history, and epidemiological information. The expected result is Negative.  Fact Sheet for Patients:  EntrepreneurPulse.com.au  Fact Sheet for Healthcare Providers:  IncredibleEmployment.be  This test is no t yet approved or cleared by the [place] FDA and  has been authorized for detection and/or diagnosis of SARS-CoV-2 by FDA under an Emergency Use Authorization (EUA). This EUA will remain  in effect (meaning this test can be used) for the duration of the COVID-19 declaration under Section 564(b)(1) of the Act, 21 U.S.C.section 360bbb-3(b)(1), unless the authorization is terminated  or revoked sooner.       Influenza A by PCR NEGATIVE NEGATIVE Final   Influenza B by PCR NEGATIVE NEGATIVE Final    Comment: (NOTE) The Xpert Xpress SARS-CoV-2/FLU/RSV plus assay is intended as an aid in the diagnosis of influenza from  Nasopharyngeal swab specimens and should not be used as a sole basis for treatment. Nasal washings and aspirates are unacceptable for Xpert Xpress  SARS-CoV-2/FLU/RSV testing.  Fact Sheet for Patients: EntrepreneurPulse.com.au  Fact Sheet for Healthcare Providers: IncredibleEmployment.be  This test is not yet approved or cleared by the [place] FDA and has been authorized for detection and/or diagnosis of SARS-CoV-2 by FDA under an Emergency Use Authorization (EUA). This EUA will remain in effect (meaning this test can be used) for the duration of the COVID-19 declaration under Section 564(b)(1) of the Act, 21 U.S.C. section 360bbb-3(b)(1), unless the authorization is terminated or revoked.  Performed at [facility], [place] [address], [place], [place] [address]   Blood culture (routine x 2)     Status: None   Collection Time: 11/02/21  9:45 PM   Specimen: BLOOD RIGHT ARM  Result Value Ref Range Status   Specimen Description BLOOD RIGHT ARM  Final   Special Requests   Final    BOTTLES DRAWN AEROBIC AND ANAEROBIC Blood Culture adequate volume   Culture   Final    NO GROWTH 5 DAYS Performed at [facility], [place] [address] [place], [place] [address]    Report Status 11/07/2021 FINAL  Final     Labs: BNP (last 3 results) No results for input(s): BNP in the last 8760 hours. Basic Metabolic Panel: Recent Labs  Lab 11/02/21 2105 11/04/21 0419  NA 137 137  K 4.1 4.3  CL 101 104  CO2 28 26  GLUCOSE 194* 103*  BUN 22 17  CREATININE 1.16 0.96  CALCIUM 8.5* 8.5*   Liver Function Tests: Recent Labs  Lab 11/02/21 2105  AST 47*  ALT 25  ALKPHOS 240*  BILITOT 0.9  PROT 5.9*  ALBUMIN 2.7*   Recent Labs  Lab 11/02/21 2105  LIPASE 55*   No results for input(s): AMMONIA in the last 168 hours. CBC: Recent Labs  Lab 11/02/21 2105 11/03/21 1804 11/04/21 0030 11/04/21 0419  WBC 5.2  --   --  5.2  NEUTROABS 4.2  --   --   --   HGB 8.8* 7.9* 9.0* 8.8*  HCT 27.6* 24.7* 28.1* 26.6*  MCV 106.6*  --   --  101.5*  PLT 146*  --   --  119*   Cardiac Enzymes: No  results for input(s): CKTOTAL, CKMB, CKMBINDEX, TROPONINI in the last 168 hours. BNP: Invalid input(s): POCBNP CBG: Recent Labs  Lab 11/06/21 1157 11/06/21 1633 11/06/21 2140 11/07/21 0938 11/07/21 1215  GLUCAP 207* 161* 155* 115* 227*   D-Dimer No results for input(s): DDIMER in the last 72 hours. Hgb A1c No results for input(s): HGBA1C in the last 72 hours. Lipid Profile No results for input(s): CHOL, HDL, LDLCALC, TRIG, CHOLHDL, LDLDIRECT in the last 72 hours. Thyroid function studies No results for input(s): TSH, T4TOTAL, T3FREE, THYROIDAB in the last 72 hours.  Invalid input(s): FREET3 Anemia work up No results for input(s): VITAMINB12, FOLATE, FERRITIN, TIBC, IRON, RETICCTPCT in the last 72 hours. Urinalysis    Component Value Date/Time   COLORURINE YELLOW 11/02/2021 2125   APPEARANCEUR CLEAR 11/02/2021 2125   LABSPEC 1.026 11/02/2021 2125   PHURINE 5.0 11/02/2021 2125   GLUCOSEU NEGATIVE 11/02/2021 2125   GLUCOSEU NEGATIVE 10/22/2018 1451   HGBUR NEGATIVE 11/02/2021 2125   HGBUR negative 05/14/2008 1032   BILIRUBINUR NEGATIVE 11/02/2021 2125   BILIRUBINUR neg 05/15/2017 [place] 11/02/2021 2125   PROTEINUR NEGATIVE 11/02/2021  2125   UROBILINOGEN 0.2 10/22/2018 1451   NITRITE NEGATIVE 11/02/2021 2125   LEUKOCYTESUR LARGE (A) 11/02/2021 2125   Sepsis Labs Invalid input(s): PROCALCITONIN,  WBC,  LACTICIDVEN Microbiology Recent Results (from the past 240 hour(s))  Culture, body fluid w Gram Stain-bottle     Status: Abnormal   Collection Time: 11/02/21  1:23 PM   Specimen: Peritoneal Washings  Result Value Ref Range Status   Specimen Description PERITONEAL  Final   Special Requests ABDOMEN  Final   Gram Stain   Final    WBC PRESENT,BOTH PMN AND MONONUCLEAR CYTOSPIN SMEAR CRITICAL RESULT CALLED TO, READ BACK BY AND VERIFIED WITH: BOBBY,RN_[email or web address]  11/03/21 [place] GRAM POSITIVE RODS ANAEROBIC BOTTLE ONLY Performed at [facility], [place] [address], [place], [place] [address]    Culture CLOSTRIDIUM PERFRINGENS (A)  Final   Report Status 11/05/2021 FINAL  Final  Blood culture (routine x 2)     Status: None   Collection Time: 11/02/21  9:00 PM   Specimen: BLOOD LEFT ARM  Result Value Ref Range Status   Specimen Description BLOOD LEFT ARM  Final   Special Requests   Final    BOTTLES DRAWN AEROBIC ONLY Blood Culture adequate volume   Culture   Final    NO GROWTH 5 DAYS Performed at [facility], [place] [address] [place], [place] [address]    Report Status 11/07/2021 FINAL  Final  Urine Culture     Status: Abnormal   Collection Time: 11/02/21  9:25 PM   Specimen: Urine, Clean Catch  Result Value Ref Range Status   Specimen Description URINE, CLEAN CATCH  Final   Special Requests   Final    NONE Performed at [facility], [place] [address] [place], [place] [address]    Culture (A)  Final    >=100,000 COLONIES/mL ESCHERICHIA COLI 50,000 COLONIES/mL PSEUDOMONAS AERUGINOSA    Report Status 11/06/2021 FINAL  Final   Organism ID, Bacteria ESCHERICHIA COLI (A)  Final   Organism ID, Bacteria PSEUDOMONAS AERUGINOSA (A)  Final      Susceptibility   Escherichia coli - MIC*    AMPICILLIN <=2 SENSITIVE Sensitive     CEFAZOLIN <=4 SENSITIVE Sensitive     CEFEPIME <=0.12 SENSITIVE Sensitive     CEFTRIAXONE <=0.25 SENSITIVE Sensitive     CIPROFLOXACIN <=0.25 SENSITIVE Sensitive     GENTAMICIN <=1 SENSITIVE Sensitive     IMIPENEM <=0.25 SENSITIVE Sensitive     NITROFURANTOIN <=16 SENSITIVE Sensitive     TRIMETH/SULFA <=20 SENSITIVE Sensitive     AMPICILLIN/SULBACTAM <=2 SENSITIVE Sensitive     PIP/TAZO <=4 SENSITIVE Sensitive     * >=100,000 COLONIES/mL ESCHERICHIA COLI   Pseudomonas aeruginosa - MIC*    CEFTAZIDIME <=1 SENSITIVE Sensitive     CIPROFLOXACIN <=0.25 SENSITIVE Sensitive     GENTAMICIN <=1 SENSITIVE Sensitive     IMIPENEM 1 SENSITIVE Sensitive     PIP/TAZO <=4 SENSITIVE Sensitive     CEFEPIME 0.5  SENSITIVE Sensitive     * 50,000 COLONIES/mL PSEUDOMONAS AERUGINOSA  Resp Panel by RT-PCR (Flu A&B, Covid) Peripheral     Status: None   Collection Time: 11/02/21  9:35 PM   Specimen: Peripheral; Nasopharyngeal(NP) swabs in vial transport medium  Result Value Ref Range Status   SARS Coronavirus 2 by RT PCR NEGATIVE NEGATIVE Final    Comment: (NOTE) SARS-CoV-2 target nucleic acids are NOT DETECTED.  The SARS-CoV-2 RNA is generally detectable in upper respiratory specimens during  the acute phase of infection. The lowest concentration of SARS-CoV-2 viral copies this assay can detect is 138 copies/mL. A negative result does not preclude SARS-Cov-2 infection and should not be used as the sole basis for treatment or other patient management decisions. A negative result may occur with  improper specimen collection/handling, submission of specimen other than nasopharyngeal swab, presence of viral mutation(s) within the areas targeted by this assay, and inadequate number of viral copies(<138 copies/mL). A negative result must be combined with clinical observations, patient history, and epidemiological information. The expected result is Negative.  Fact Sheet for Patients:  EntrepreneurPulse.com.au  Fact Sheet for Healthcare Providers:  IncredibleEmployment.be  This test is no t yet approved or cleared by the [place] FDA and  has been authorized for detection and/or diagnosis of SARS-CoV-2 by FDA under an Emergency Use Authorization (EUA). This EUA will remain  in effect (meaning this test can be used) for the duration of the COVID-19 declaration under Section 564(b)(1) of the Act, 21 U.S.C.section 360bbb-3(b)(1), unless the authorization is terminated  or revoked sooner.       Influenza A by PCR NEGATIVE NEGATIVE Final   Influenza B by PCR NEGATIVE NEGATIVE Final    Comment: (NOTE) The Xpert Xpress SARS-CoV-2/FLU/RSV plus assay is intended as  an aid in the diagnosis of influenza from Nasopharyngeal swab specimens and should not be used as a sole basis for treatment. Nasal washings and aspirates are unacceptable for Xpert Xpress SARS-CoV-2/FLU/RSV testing.  Fact Sheet for Patients: EntrepreneurPulse.com.au  Fact Sheet for Healthcare Providers: IncredibleEmployment.be  This test is not yet approved or cleared by the [place] FDA and has been authorized for detection and/or diagnosis of SARS-CoV-2 by FDA under an Emergency Use Authorization (EUA). This EUA will remain in effect (meaning this test can be used) for the duration of the COVID-19 declaration under Section 564(b)(1) of the Act, 21 U.S.C. section 360bbb-3(b)(1), unless the authorization is terminated or revoked.  Performed at [facility], [place] [address] [place], [place] [address]   Blood culture (routine x 2)     Status: None   Collection Time: 11/02/21  9:45 PM   Specimen: BLOOD RIGHT ARM  Result Value Ref Range Status   Specimen Description BLOOD RIGHT ARM  Final   Special Requests   Final    BOTTLES DRAWN AEROBIC AND ANAEROBIC Blood Culture adequate volume   Culture   Final    NO GROWTH 5 DAYS Performed at [facility], [place] [address], [place],  [address]    Report Status 11/07/2021 FINAL  Final     Time coordinating discharge: Over 30 minutes  SIGNED:   Phillips Climes, MD  [facility] 11/07/2021, 2:38 PM Pager   If 7PM-7AM, please contact night-coverage [email or web address] Password [email or web address]

## 2021-11-07 NOTE — Progress Notes (Signed)
° °  Palliative Medicine Inpatient Follow Up Note  Consulting Provider: Elgergawy, Silver Huguenin, MD   Reason for consult:   [place] Palliative Medicine Consult  Reason for Consult? goals of care    HPI:  Per intake H&P --> [name] is a 81 y.o. male with medical history significant of diastolic CHF, diabetes mellitus type 2, NASH related cirrhosis with ascites, splenomegaly, thrombocytopenia, hepatocellular carcinoma s/p SBRT presents after being advised to come to the hospital for abnormal test results.  He had paracentesis day prior to admission with 6 L of fluid removed given 50 g of albumin x1 post paracentesis.  Cell count showed PMNs of 895 consistent with spontaneous bacterial peritonitis, so he was admitted for further work-up.   Palliative care has been asked to get involved in the setting of decompensated cirrhosis to approach the topic(s) of goals of care.  Today's Discussion (11/07/2021):  *Please note that this is a verbal dictation therefore any spelling or grammatical errors are due to the "[employer] One" system interpretation.  Chart reviewed inclusive of progress notes, laboratory results, and diagnostic imaging.  I met with [name] at bedside this morning. He expresses no concerns. Appears comfortable and non-distressed.  Plan for paracentesis today.  Reco is hopeful for discharge.   Questions and concerns addressed   Palliative Support Provided.   Objective Assessment: Vital Signs Vitals:   11/06/21 1957 11/07/21 0300  BP: 95/67 (!) 103/52  Pulse: 88 89  Resp:  18  Temp: 98 F (36.7 C) 98.6 F (37 C)  SpO2: 98% 94%    Intake/Output Summary (Last 24 hours) at 11/07/2021 0745 Last data filed at 11/07/2021 0518 Gross per 24 hour  Intake 1321.77 ml  Output --  Net 1321.77 ml    Last Weight  Most recent update: 11/07/2021  5:22 AM    Weight  79.9 kg (176 lb 2.4 oz)            Gen:  Elderly M in NAD HEENT: moist mucous  membranes CV: Regular rate and rhythm PULM: On RA ABD: soft/nontender  EXT: No edema  Neuro: Alert and oriented x3   SUMMARY OF RECOMMENDATIONS   DNAR/DNI  MOST / DNR Form Completed, paper copy placed onto the chart electric copy can be found in [employer] reviewed - patient will bring to his PCP  TOC - OP Palliative support on discharge  MDM -  Moderate  Medical Decision Making: 3 #/Complex Problems: 4                     Data Reviewed:    3             Management: 3 (1-Straightforward, 2-Low, 3-Moderate, 4-High) ______________________________________________________________________________________ [facility] Team Team Cell Phone: [phone] Please utilize secure chat with additional questions, if there is no response within 30 minutes please call the above phone number  Palliative Medicine Team providers are available by phone from 7am to 7pm daily and can be reached through the team cell phone.  Should this patient require assistance outside of these hours, please call the patient's attending physician.

## 2021-11-07 NOTE — Procedures (Signed)
° °  US guided LLQ paracentesis  4 L max per MD--- yellow fluid No labs sent per MD  Tolerated well  EBL: less than 1 cc

## 2021-11-08 ENCOUNTER — Telehealth: Payer: Self-pay

## 2021-11-08 NOTE — Telephone Encounter (Signed)
Transition Care Management Unsuccessful Follow-up Telephone Call  Date of discharge and from where:  [place] 11/07/21  Attempts:  2nd Attempt  Reason for unsuccessful TCM follow-up call:  No answer/busy

## 2021-11-10 ENCOUNTER — Other Ambulatory Visit (INDEPENDENT_AMBULATORY_CARE_PROVIDER_SITE_OTHER): Payer: Medicare HMO

## 2021-11-10 ENCOUNTER — Encounter: Payer: Self-pay | Admitting: Internal Medicine

## 2021-11-10 ENCOUNTER — Telehealth: Payer: Self-pay

## 2021-11-10 ENCOUNTER — Ambulatory Visit: Payer: Medicare HMO | Admitting: Internal Medicine

## 2021-11-10 ENCOUNTER — Encounter: Payer: Self-pay | Admitting: Cardiology

## 2021-11-10 ENCOUNTER — Ambulatory Visit: Payer: Medicare HMO | Admitting: Cardiology

## 2021-11-10 ENCOUNTER — Other Ambulatory Visit: Payer: Self-pay

## 2021-11-10 VITALS — BP 120/64 | HR 100 | Wt 176.0 lb

## 2021-11-10 VITALS — BP 106/60 | HR 97 | Resp 16 | Ht 71.0 in | Wt 176.6 lb

## 2021-11-10 DIAGNOSIS — K7031 Alcoholic cirrhosis of liver with ascites: Secondary | ICD-10-CM | POA: Diagnosis not present

## 2021-11-10 DIAGNOSIS — C220 Liver cell carcinoma: Secondary | ICD-10-CM

## 2021-11-10 DIAGNOSIS — I350 Nonrheumatic aortic (valve) stenosis: Secondary | ICD-10-CM | POA: Diagnosis not present

## 2021-11-10 DIAGNOSIS — K766 Portal hypertension: Secondary | ICD-10-CM | POA: Diagnosis not present

## 2021-11-10 DIAGNOSIS — K652 Spontaneous bacterial peritonitis: Secondary | ICD-10-CM | POA: Diagnosis not present

## 2021-11-10 DIAGNOSIS — Z8679 Personal history of other diseases of the circulatory system: Secondary | ICD-10-CM | POA: Diagnosis not present

## 2021-11-10 LAB — CBC WITH DIFFERENTIAL/PLATELET
Basophils Absolute: 0.1 10*3/uL (ref 0.0–0.1)
Basophils Relative: 1 % (ref 0.0–3.0)
Eosinophils Absolute: 0.1 10*3/uL (ref 0.0–0.7)
Eosinophils Relative: 1.3 % (ref 0.0–5.0)
HCT: 29.3 % — ABNORMAL LOW (ref 39.0–52.0)
Hemoglobin: 9.9 g/dL — ABNORMAL LOW (ref 13.0–17.0)
Lymphocytes Relative: 11.5 % — ABNORMAL LOW (ref 12.0–46.0)
Lymphs Abs: 0.8 10*3/uL (ref 0.7–4.0)
MCHC: 33.7 g/dL (ref 30.0–36.0)
MCV: 100.5 fl — ABNORMAL HIGH (ref 78.0–100.0)
Monocytes Absolute: 0.5 10*3/uL (ref 0.1–1.0)
Monocytes Relative: 7 % (ref 3.0–12.0)
Neutro Abs: 5.5 10*3/uL (ref 1.4–7.7)
Neutrophils Relative %: 79.2 % — ABNORMAL HIGH (ref 43.0–77.0)
Platelets: 169 10*3/uL (ref 150.0–400.0)
RBC: 2.92 Mil/uL — ABNORMAL LOW (ref 4.22–5.81)
RDW: 16.8 % — ABNORMAL HIGH (ref 11.5–15.5)
WBC: 7 10*3/uL (ref 4.0–10.5)

## 2021-11-10 LAB — BASIC METABOLIC PANEL
BUN: 33 mg/dL — ABNORMAL HIGH (ref 6–23)
CO2: 30 mEq/L (ref 19–32)
Calcium: 9.3 mg/dL (ref 8.4–10.5)
Chloride: 103 mEq/L (ref 96–112)
Creatinine, Ser: 1.25 mg/dL (ref 0.40–1.50)
GFR: 54.28 mL/min — ABNORMAL LOW (ref >60.00)
Glucose, Bld: 116 mg/dL — ABNORMAL HIGH (ref 70–99)
Potassium: 5.3 mEq/L — ABNORMAL HIGH (ref 3.5–5.1)
Sodium: 137 mEq/L (ref 135–145)

## 2021-11-10 MED ORDER — SPIRONOLACTONE 50 MG PO TABS: 75.0000 mg | ORAL_TABLET | Freq: Every day | ORAL | 1 refills | Status: DC

## 2021-11-10 MED ORDER — FUROSEMIDE 40 MG PO TABS: 40.0000 mg | ORAL_TABLET | Freq: Every day | ORAL | 1 refills | Status: DC

## 2021-11-10 MED ORDER — SULFAMETHOXAZOLE-TRIMETHOPRIM 800-160 MG PO TABS: 1.0000 | ORAL_TABLET | Freq: Every day | ORAL | 2 refills | Status: DC

## 2021-11-10 NOTE — Patient Instructions (Addendum)
Medication Instructions:   Your physician recommends that you continue on your current medications as directed. Please refer to the Current Medication list given to you today.  Labwork:  none  Testing/Procedures:  none  Follow-Up:  Your physician recommends that you schedule a follow-up appointment in: 3 months.  Any Other Special Instructions Will Be Listed Below (If Applicable).  If you need a refill on your cardiac medications before your next appointment, please call your pharmacy. 

## 2021-11-10 NOTE — Patient Instructions (Addendum)
Continue your Lasix(Furosemide) at 40mg  - once daily.   Continue your Aldactone(spirolactone) at 75mg  - once daily.   Continue Bactrim indefinitely.   Follow-up in 1 month 12/23/21 at 11:00am with Dr. Jenkins Rouge will be contacted by the Nurse with information regarding your Large Volume Paracentesis.    If you are age 81 or older, your body mass index should be between 23-30. Your Body mass index is 24.63 kg/m. If this is out of the aforementioned range listed, please consider follow up with your Primary Care Provider.  If you are age 77 or younger, your body mass index should be between 19-25. Your Body mass index is 24.63 kg/m. If this is out of the aformentioned range listed, please consider follow up with your Primary Care Provider.   ________________________________________________________  The [facility] providers would like to encourage you to use [facility] to communicate with providers for non-urgent requests or questions.  Due to long hold times on the telephone, sending your provider a message by [facility] may be a faster and more efficient way to get a response.  Please allow 48 business hours for a response.  Please remember that this is for non-urgent requests.  _______________________________________________________  Thank you for choosing me and [facility].  Dr. Ulice Dash Pyrtle

## 2021-11-10 NOTE — Progress Notes (Signed)
Cardiology Office Note  Date: 11/10/2021   ID: [name], [name] 02/12/1941, MRN [id number]  PCP:  [name], MD  Cardiologist:  Rozann Lesches, MD Electrophysiologist:  None   Chief Complaint  Patient presents with   Cardiac follow-up    History of Present Illness: [name] is an 81 y.o. male last seen in September 2022.  He is here today for a follow-up visit.  I reviewed interval records. He was recently hospitalized with spontaneous bacterial peritonitis diagnosed after large volume paracentesis.  He was treated with antibiotics demonstrating improvement.  Also continued on Aldactone and Lasix for diuretic regimen.  He was found to have UTI as well in the process of work-up.  Baseline history includes hepatic cirrhosis with recurrent ascites and hepatocellular carcinoma.  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  LVEF has improved with time on medical therapy.  For his current medications which are noted below.  Past Medical History:  Diagnosis Date   Benign prostatic hypertrophy    Blood transfusion without reported diagnosis    CHF (congestive heart failure) ([place])    a. EF 20-25% by echo in 11/2019   COPD (chronic obstructive pulmonary disease) ([facility] [place] [facility]) 12/15/2009   FEV1 2.30 (70%) ratio 63 no better with B2 and DLCO 18/6 (73%) corrects to 106%   Degenerative joint disease    Depression    Diverticulosis    Esophageal varices (HCC)    Essential hypertension    ACE inhibitor cough   Fatty liver    Hepatocellular carcinoma (HCC)    Hyperlipidemia    Internal hemorrhoids    Liver cancer ([place])    Low back pain    Otitis externa 07/30/2012   Portal hypertensive gastropathy (HCC)    Splenomegaly    Thrombocytopenia (HCC)    Type 2 diabetes mellitus ([place])    Ulcer of foot ([place])    Right foot    Past  Surgical History:  Procedure Laterality Date   BIOPSY  07/14/2021   Procedure: BIOPSY;  Surgeon: Daryel November, MD;  Location: [place];  Service: Gastroenterology;;  EGD and COLON   COLONOSCOPY  2020   JMP-MAC-plenvu (good)-polyps-recall 1 yr   COLONOSCOPY WITH PROPOFOL N/A 07/14/2021   Procedure: COLONOSCOPY WITH PROPOFOL;  Surgeon: Daryel November, MD;  Location: [facility] ENDOSCOPY;  Service: Gastroenterology;  Laterality: N/A;   ESOPHAGOGASTRODUODENOSCOPY (EGD) WITH PROPOFOL N/A 07/14/2021   Procedure: ESOPHAGOGASTRODUODENOSCOPY (EGD) WITH PROPOFOL;  Surgeon: Daryel November, MD;  Location: [place];  Service: Gastroenterology;  Laterality: N/A;   IR EMBO TUMOR ORGAN ISCHEMIA INFARCT INC GUIDE ROADMAPPING  06/01/2020   IR PARACENTESIS  08/30/2021   IR PARACENTESIS  09/22/2021   IR PARACENTESIS  10/12/2021   IR PARACENTESIS  11/02/2021   IR RADIOLOGIST EVAL & MGMT  04/28/2020   IR RADIOLOGIST EVAL & MGMT  07/15/2020   IR RADIOLOGIST EVAL & MGMT  05/04/2021   NO PAST SURGERIES  1980    Current Outpatient Medications  Medication Sig Dispense Refill   albuterol (VENTOLIN HFA) 108 (90 Base) MCG/ACT inhaler Inhale 2 puffs into the lungs every 6 (six) hours as needed for wheezing or shortness of breath. 18 g 3   Alcohol Swabs (DROPSAFE ALCOHOL PREP) 70 % PADS USE  UP  TO FOUR TIMES DAILY AS DIRECTED 400 each 3   aspirin EC 81 MG tablet Take  81 mg by mouth daily.     Blood Glucose Monitoring Suppl (TRUE METRIX AIR GLUCOSE METER) w/Device KIT Use to check blood sugars daily. Dx: E11.9 1 kit 3   busPIRone (BUSPAR) 7.5 MG tablet Take 1 tablet (7.5 mg total) by mouth 2 (two) times daily as needed. for anxiety (Patient taking differently: Take 7.5 mg by mouth at bedtime.) 180 tablet 3   carvedilol (COREG) 6.25 MG tablet Take 1 tablet (6.25 mg total) by mouth 2 (two) times daily with a meal. 180 tablet 1   escitalopram (LEXAPRO) 5 MG tablet Take 1 tablet (5 mg total) by mouth daily. 90  tablet 3   famotidine (PEPCID) 20 MG tablet Take 20 mg by mouth 2 (two) times daily.     finasteride (PROSCAR) 5 MG tablet TAKE 1 TABLET EVERY DAY (Patient taking differently: Take 5 mg by mouth daily.) 90 tablet 1   furosemide (LASIX) 40 MG tablet Take 1 tablet (40 mg total) by mouth daily. 30 tablet 1   gabapentin (NEURONTIN) 300 MG capsule TAKE 1 CAPSULE TWICE DAILY (Patient taking differently: Take 300 mg by mouth 2 (two) times daily.) 180 capsule 3   loperamide (IMODIUM) 2 MG capsule Take 1 capsule (2 mg total) by mouth 3 (three) times daily between meals as needed for diarrhea or loose stools. 30 capsule 0   Multiple Vitamin (MULTIVITAMIN WITH MINERALS) TABS tablet Take 1 tablet by mouth daily. 30 tablet 0   Polyvinyl Alcohol-Povidone (REFRESH OP) Place 1 drop into both eyes at bedtime.     rosuvastatin (CRESTOR) 10 MG tablet TAKE 1 TABLET EVERY DAY (Patient taking differently: Take 10 mg by mouth daily. TAKE 1 TABLET EVERY DAY) 90 tablet 3   sacubitril-valsartan (ENTRESTO) 24-26 MG Take 1 tablet by mouth 2 (two) times daily. 180 tablet 3   spironolactone (ALDACTONE) 50 MG tablet Take 1.5 tablets (75 mg total) by mouth daily. 30 tablet 1   sulfamethoxazole-trimethoprim (BACTRIM DS) 800-160 MG tablet Take 1 tablet by mouth daily. 30 tablet 2   tamsulosin (FLOMAX) 0.4 MG CAPS capsule TAKE 1 CAPSULE EVERY DAY (Patient taking differently: Take 0.4 mg by mouth daily.) 90 capsule 1   TRUE METRIX BLOOD GLUCOSE TEST test strip TEST BLOOD SUGAR  UP  TO FOUR TIMES DAILY AS DIRECTED 300 strip 3   TRUEplus Lancets 28G MISC TEST BLOOD SUGAR  UP  TO FOUR TIMES DAILY AS DIRECTED 100 each 3   No current facility-administered medications for this visit.   Allergies:  Patient has no known allergies.   ROS: No palpitations or syncope.  Physical Exam: VS:  BP 120/64    Pulse 100    Wt 176 lb (79.8 kg)    SpO2 99%    BMI 24.55 kg/m , BMI Body mass index is 24.55 kg/m.  Wt Readings from Last 3 Encounters:   11/10/21 176 lb (79.8 kg)  11/10/21 176 lb 9.6 oz (80.1 kg)  11/07/21 176 lb 2.4 oz (79.9 kg)    General: Elderly male in no distress. HEENT: Conjunctiva and lids normal, wearing a mask. Neck: Supple, no elevated JVP or carotid bruits, no thyromegaly. Lungs: Clear to auscultation, nonlabored breathing at rest. Cardiac: Regular rate and rhythm, no S3, 3/6 systolic murmur. Abdomen: Protuberant, bowel sounds present, tender. Extremities: Trace ankle edema.  ECG:  An ECG dated 12/14/2020 was personally reviewed today and demonstrated:  Sinus rhythm with incomplete right bundle block and nonspecific ST-T changes.  Recent Labwork: 07/13/2021: Magnesium 1.9 07/28/2021: TSH  4.45 11/02/2021: ALT 25; AST 47 11/10/2021: BUN 33; Creatinine, Ser 1.25; Hemoglobin 9.9; Platelets 169.0; Potassium 5.3 No hemolysis seen; Sodium 137     Component Value Date/Time   CHOL 120 11/07/2019 1146   TRIG 126.0 11/07/2019 1146   HDL 45.60 11/07/2019 1146   CHOLHDL 3 11/07/2019 1146   VLDL 25.2 11/07/2019 1146   LDLCALC 49 11/07/2019 1146   LDLDIRECT 57.0 10/22/2018 1451    Other Studies Reviewed Today:  Echocardiogram 10/06/2021:  1. Left ventricular ejection fraction, by estimation, is 55%. The left  ventricle has normal function. The left ventricle has no regional wall  motion abnormalities. There is mild left ventricular hypertrophy. Left  ventricular diastolic parameters are  consistent with Grade I diastolic dysfunction (impaired relaxation).   2. Right ventricular systolic function is normal. The right ventricular  size is normal.   3. Left atrial size was mildly dilated.   4. The mitral valve is abnormal. No evidence of mitral valve  regurgitation. No evidence of mitral stenosis. Moderate mitral annular  calcification.   5. Gradients valve area and DVI similar to echo done 06/17/20. The aortic  valve is calcified. Aortic valve regurgitation is not visualized. Moderate  aortic valve stenosis.   6.  The inferior vena cava is normal in size with greater than 50%  respiratory variability, suggesting right atrial pressure of 3 mmHg.   Assessment and Plan:  1.  HFrecEF, LVEF has normalized at approximately 55% on medical therapy, RV contraction also normal.  He does have mild diastolic dysfunction.  Main issues with his fluid status however are related to recurrent ascites in the setting of liver disease.  Continue Coreg, Entresto, Aldactone, and Lasix.  2.  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  He is asymptomatic at this time.  Continue observation.  3.  Hepatic cirrhosis with recurrent ascites and hepatocellular carcinoma.  He continues to follow with gastroenterology and oncology.  Medication Adjustments/Labs and Tests Ordered: Current medicines are reviewed at length with the patient today.  Concerns regarding medicines are outlined above.   Tests Ordered: No orders of the defined types were placed in this encounter.   Medication Changes: No orders of the defined types were placed in this encounter.   Disposition:  Follow up  4 months.  Signed, Satira Sark, MD, [facility] 11/10/2021 3:35 PM    [place] [facility] [place], [place], [place] [address] Phone: [phone]; Fax: [phone]

## 2021-11-10 NOTE — Telephone Encounter (Signed)
Transition Care Management Unsuccessful Follow-up Telephone Call  Date of discharge and from where:  [place] 11/07/21  Attempts:  3rd Attempt  Reason for unsuccessful TCM follow-up call:  Unable to leave message

## 2021-11-11 ENCOUNTER — Encounter: Payer: Self-pay | Admitting: Internal Medicine

## 2021-11-11 ENCOUNTER — Other Ambulatory Visit: Payer: Self-pay

## 2021-11-11 DIAGNOSIS — C220 Liver cell carcinoma: Secondary | ICD-10-CM

## 2021-11-11 NOTE — Progress Notes (Signed)
Subjective:    Patient ID: [name], male    DOB: 02-02-1941, 81 y.o.   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  He is here today with his wife.  He was recently discharged just 3 days ago from the hospital where he was admitted with SBP.  He was treated with IV antibiotics and then transition to Bactrim double strength once daily for prophylaxis.  He had a paracentesis just before leaving the hospital.  He has been taking Lasix 40 mg and Aldactone 75 mg daily.  He reports that he is actually feeling somewhat better.  He is breathing easier since his last paracentesis.  He denies chest pain and dyspnea today.  No abdominal pain.  No leg swelling but he has noticed increasing abdominal swelling even since paracentesis.  He is trying to follow a low-sodium diet.  His wife has not noticed any confusion or encephalopathy type symptoms.   Review of Systems As per HPI, otherwise negative  Current Medications, Allergies, Past Medical History, Past Surgical History, Family History and Social History were reviewed in [employer] record.    Objective:   Physical Exam BP 106/60 (BP Location: Left Arm, Patient Position: Sitting, Cuff Size: Normal)    Pulse 97    Resp 16    Ht 5\' 11"  (1.803 m)    Wt 176 lb 9.6 oz (80.1 kg)    SpO2 95%    BMI 24.63 kg/m  Gen: awake, alert, NAD, chronically ill-appearing HEENT: anicteric CV: RRR, no mrg Pulm: Decreased bilateral bases Abd: soft, mildly distended with ascites not tense,  +BS throughout Ext: no c/c/e Neuro: nonfocal, no asterixis  CBC Latest Ref Rng & Units 11/10/2021 11/04/2021 11/04/2021  WBC 4.0 - 10.5 K/uL 7.0 5.2 -  Hemoglobin 13.0 - 17.0 g/dL 9.9(L) 8.8(L) 9.0(L)  Hematocrit 39.0 - 52.0 % 29.3(L) 26.6(L) 28.1(L)  Platelets  150.0 - 400.0 K/uL 169.0 119(L) -   .cmpr Lab Results  Component Value Date   INR 1.3 (H) 11/03/2021   INR 1.1 10/08/2021   INR 1.1 (H) 09/16/2021   MELD-Na score: 9 at 11/04/2021  4:19 AM MELD score: 9 at 11/04/2021  4:19 AM Calculated from: Serum Creatinine: 0.96 mg/dL (Using min of 1 mg/dL) at 11/04/2021  4:19 AM Serum Sodium: 137 mmol/L at 11/04/2021  4:19 AM Total Bilirubin: 0.9 mg/dL (Using min of 1 mg/dL) at 11/02/2021  9:05 PM INR(ratio): 1.3 at 11/03/2021  6:04 PM Age: 29 years  CMP     Component Value Date/Time   NA 137 11/10/2021 1001   NA 143 01/15/2020 0000   K 5.3 No hemolysis seen (H) 11/10/2021 1001   CL 103 11/10/2021 1001   CO2 30 11/10/2021 1001   GLUCOSE 116 (H) 11/10/2021 1001   BUN 33 (H) 11/10/2021 1001   BUN 17 01/15/2020 0000   CREATININE 1.25 11/10/2021 1001   CREATININE 0.94 10/13/2021 0909   CALCIUM 9.3 11/10/2021 1001   PROT 5.9 (L) 11/02/2021 2105   ALBUMIN 2.7 (L) 11/02/2021 2105   AST 47 (H) 11/02/2021 2105   AST 23 10/13/2021 0909   ALT 25 11/02/2021 2105   ALT 7 10/13/2021 0909   ALKPHOS 240 (H) 11/02/2021 2105   BILITOT 0.9 11/02/2021 2105   BILITOT 0.6 10/13/2021 0909   GFRNONAA >60  11/04/2021 0419   GFRNONAA >60 10/13/2021 0909   GFRAA 59 (L) 06/09/2020 0859        Assessment & Plan:  81 year old male with a 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 --We discussed ascites management today.  Unfortunately he had SBP and I explained the need for chronic daily prophylaxis against future infection.  We will continue Bactrim 1 double strength tablet daily indefinitely --Will continue diuretics but I am having to decrease the spironolactone to 50 mg daily due to mild increase in  potassium and serum creatinine.;  Continue furosemide 40 mg daily for now --We will tentatively plan for large-volume paracentesis with 50 g of IV albumin every 2 weeks beginning on 11/19/2021 --Continue 2 g sodium diet --Continue carvedilol for prophylaxis of variceal hemorrhage  2.  [place] with prior treatment --following with Dr. Irene Limbo; MRI pending 4-6 23.  3.  Atypical gastric lesion per EGD 07/13/2021 --likely reactive atypical change on biopsy possibly from prior ulcer.  Based on his deconditioning and frail state I did not recommend repeat EGD at this time.  Follow-up with [place] in about a month  30 minutes total spent today including patient facing time, coordination of care, reviewing medical history/procedures/pertinent radiology studies, and documentation of the encounter.

## 2021-11-11 NOTE — Telephone Encounter (Signed)
PATIENT IS SCHEDULED FOR 11/16/21.

## 2021-11-11 NOTE — Progress Notes (Signed)
Standing order for IR para faxed to radiology at [phone]. Orders included for labs, albumin, IR para to start 11/19/21 and be done every 14 days prn.Rad scheduling to contact pt with appt.

## 2021-11-12 ENCOUNTER — Other Ambulatory Visit (HOSPITAL_COMMUNITY): Payer: Self-pay | Admitting: Internal Medicine

## 2021-11-12 ENCOUNTER — Other Ambulatory Visit: Payer: Self-pay | Admitting: Internal Medicine

## 2021-11-12 DIAGNOSIS — K7031 Alcoholic cirrhosis of liver with ascites: Secondary | ICD-10-CM

## 2021-11-13 ENCOUNTER — Ambulatory Visit (HOSPITAL_COMMUNITY)
Admission: RE | Admit: 2021-11-13 | Discharge: 2021-11-13 | Disposition: A | Discharge status: Home / Self Care | Payer: Medicare HMO | Source: Ambulatory Visit | Attending: Hematology | Admitting: Hematology

## 2021-11-13 DIAGNOSIS — D734 Cyst of spleen: Secondary | ICD-10-CM | POA: Diagnosis not present

## 2021-11-13 DIAGNOSIS — K7689 Other specified diseases of liver: Secondary | ICD-10-CM | POA: Diagnosis not present

## 2021-11-13 DIAGNOSIS — C220 Liver cell carcinoma: Secondary | ICD-10-CM | POA: Insufficient documentation

## 2021-11-13 DIAGNOSIS — K7460 Unspecified cirrhosis of liver: Secondary | ICD-10-CM | POA: Diagnosis not present

## 2021-11-13 MED ORDER — GADOBUTROL 1 MMOL/ML IV SOLN
8.0000 mL | Freq: Once | INTRAVENOUS | Status: AC | PRN
  Administered 2021-11-13: 8 mL via INTRAVENOUS

## 2021-11-15 ENCOUNTER — Ambulatory Visit (HOSPITAL_COMMUNITY)
Admission: RE | Admit: 2021-11-15 | Discharge: 2021-11-15 | Disposition: A | Discharge status: Home / Self Care | Payer: Medicare HMO | Source: Ambulatory Visit | Attending: Internal Medicine | Admitting: Internal Medicine

## 2021-11-15 ENCOUNTER — Other Ambulatory Visit: Payer: Self-pay

## 2021-11-15 DIAGNOSIS — K7031 Alcoholic cirrhosis of liver with ascites: Secondary | ICD-10-CM | POA: Diagnosis not present

## 2021-11-15 DIAGNOSIS — R188 Other ascites: Secondary | ICD-10-CM | POA: Diagnosis not present

## 2021-11-15 DIAGNOSIS — C220 Liver cell carcinoma: Secondary | ICD-10-CM | POA: Diagnosis not present

## 2021-11-15 HISTORY — PX: IR PARACENTESIS: IMG2679

## 2021-11-15 LAB — BODY FLUID CELL COUNT WITH DIFFERENTIAL
Eos, Fluid: 0 %
Lymphs, Fluid: 50 %
Monocyte-Macrophage-Serous Fluid: 31 % — ABNORMAL LOW (ref 50–90)
Neutrophil Count, Fluid: 19 % (ref 0–25)
Total Nucleated Cell Count, Fluid: 214 cu mm (ref 0–1000)

## 2021-11-15 MED ORDER — ALBUMIN HUMAN 25 % IV SOLN
50.0000 g | Freq: Once | INTRAVENOUS | Status: DC
  Filled 2021-11-15: qty 200

## 2021-11-15 MED ORDER — ALBUMIN HUMAN 25 % IV SOLN
INTRAVENOUS | Status: AC
  Filled 2021-11-15: qty 200

## 2021-11-15 MED ORDER — LIDOCAINE HCL 1 % IJ SOLN
INTRAMUSCULAR | Status: AC
  Administered 2021-11-15: 10 mL
  Filled 2021-11-15: qty 20

## 2021-11-15 NOTE — Procedures (Signed)
PROCEDURE SUMMARY:  Successful US guided diagnostic and therapeutic paracentesis from [facility].  Yielded 3 L of clear, yellow fluid.  No immediate complications.  Pt BP dropped during fluid removal. Procedure was stopped due to decreased in BP.  Specimen was sent for labs.  EBL < 1 mL  Tyson Alias, AGNP 11/15/2021 2:27 PM

## 2021-11-16 ENCOUNTER — Encounter: Payer: Self-pay | Admitting: Family Medicine

## 2021-11-16 ENCOUNTER — Ambulatory Visit (INDEPENDENT_AMBULATORY_CARE_PROVIDER_SITE_OTHER): Payer: Medicare HMO | Admitting: Family Medicine

## 2021-11-16 VITALS — BP 98/58 | HR 97 | Temp 97.1°F | Ht 71.0 in | Wt 169.8 lb

## 2021-11-16 DIAGNOSIS — E1142 Type 2 diabetes mellitus with diabetic polyneuropathy: Secondary | ICD-10-CM | POA: Diagnosis not present

## 2021-11-16 DIAGNOSIS — E785 Hyperlipidemia, unspecified: Secondary | ICD-10-CM | POA: Diagnosis not present

## 2021-11-16 DIAGNOSIS — I10 Essential (primary) hypertension: Secondary | ICD-10-CM | POA: Diagnosis not present

## 2021-11-16 DIAGNOSIS — F3342 Major depressive disorder, recurrent, in full remission: Secondary | ICD-10-CM | POA: Diagnosis not present

## 2021-11-16 DIAGNOSIS — C220 Liver cell carcinoma: Secondary | ICD-10-CM

## 2021-11-16 DIAGNOSIS — K7031 Alcoholic cirrhosis of liver with ascites: Secondary | ICD-10-CM | POA: Diagnosis not present

## 2021-11-16 DIAGNOSIS — I5022 Chronic systolic (congestive) heart failure: Secondary | ICD-10-CM | POA: Diagnosis not present

## 2021-11-16 LAB — GRAM STAIN

## 2021-11-16 MED ORDER — ROSUVASTATIN CALCIUM 5 MG PO TABS: ORAL_TABLET | ORAL | 3 refills | Status: DC

## 2021-11-16 NOTE — Patient Instructions (Addendum)
Cut rosuvastatin in half to 5 mg from the 10 mg pills. I sent in 5 mg for you- we will plan on bloodwork next time- holding off for now since they are going to check soon with GI  Recommended follow up: Return for next already scheduled visit. In April

## 2021-11-16 NOTE — Progress Notes (Signed)
Phone [phone] In person visit   Subjective:   [name] is a 81 y.o. year old very pleasant male patient who presents for/with See problem oriented charting Chief Complaint  Patient presents with   Follow-up    Pt is here to f/u from his hospital visit for SBP.    This visit occurred during the SARS-CoV-2 public health emergency.  Safety protocols were in place, including screening questions prior to the visit, additional usage of staff PPE, and extensive cleaning of exam room while observing appropriate contact time as indicated for disinfecting solutions.   Past Medical History-  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[place]) 06/01/2020    Priority: High   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[place]) 12/14/2019    Priority: High   Chronic systolic heart failure (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 Off narcotics 06/05/17 due to negative UDS for opiates x2. Full history 06/12/14. Pain contract signed.  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[place]) 04/06/2007    Priority: High   Anxiety 02/25/2020    Priority: Medium    B12 deficiency 08/20/2015    Priority: Medium    Diabetic polyneuropathy associated with type 2 diabetes mellitus ([place]) 08/07/2015    Priority: Medium    CKD (chronic kidney disease), stage III ([place]) 05/06/2015    Priority: Medium    Fatty liver 11/04/2014    Priority: Medium    BPH (benign prostatic hyperplasia) 06/20/2007    Priority: Medium    Depression 05/02/2007    Priority: Medium    Hyperlipidemia associated with type 2 diabetes mellitus ([place]) 04/06/2007    Priority:  Medium    Essential hypertension 04/06/2007    Priority: Medium    Osteoarthritis 04/06/2007    Priority: Medium    Iron deficiency anemia due to chronic blood loss 04/19/2021    Priority: Low   Aortic atherosclerosis ([place]) 12/14/2019    Priority: Low   GERD (gastroesophageal reflux disease) 09/30/2017    Priority: Low   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 positive 03/29/2016    Priority: Low   Diplopia 06/05/2015    Priority: Low   Candidal balanoposthitis 06/20/2014    Priority: Low   PCO (posterior capsular opacification) 06/19/2013    Priority: Low   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    Priority: Low   Pseudophakia of left eye 04/10/2013    Priority: Low   Nuclear cataract 01/20/2012    Priority: Low   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    Priority: Low   Recurrent major depressive disorder, in full remission ([place]) 11/16/2021   Protein-calorie malnutrition, severe 11/05/2021   Sepsis ([place]) 11/04/2021   Acute lower UTI 11/04/2021   Other ascites    Sepsis due to Spontaneous bacterial peritonitis ([place]) 11/03/2021   Malnutrition of moderate degree 07/14/2021   Cirrhosis of liver with ascites (H[facility])    Mucosal abnormality of stomach    Portal hypertensive gastropathy ([place])    Secondary esophageal varices without bleeding ([place])    Generalized weakness 07/13/2021   Chronic diarrhea 07/13/2021   Diarrhea due to malabsorption  07/13/2021   Macrocytic anemia 03/05/2018    Medications- reviewed and updated Current Outpatient Medications  Medication Sig Dispense Refill   albuterol (VENTOLIN HFA) 108 (90 Base) MCG/ACT inhaler Inhale 2 puffs into the lungs every 6 (six) hours as needed for wheezing or shortness of breath. 18 g 3   Alcohol Swabs (DROPSAFE ALCOHOL PREP) 70 % PADS USE  UP  TO FOUR TIMES DAILY AS DIRECTED 400 each 3   aspirin EC 81 MG tablet Take 81 mg by mouth daily.     Blood Glucose Monitoring Suppl (TRUE METRIX AIR GLUCOSE METER) w/Device KIT Use to check blood  sugars daily. Dx: E11.9 1 kit 3   busPIRone (BUSPAR) 7.5 MG tablet Take 1 tablet (7.5 mg total) by mouth 2 (two) times daily as needed. for anxiety (Patient taking differently: Take 7.5 mg by mouth at bedtime.) 180 tablet 3   carvedilol (COREG) 6.25 MG tablet Take 1 tablet (6.25 mg total) by mouth 2 (two) times daily with a meal. 180 tablet 1   escitalopram (LEXAPRO) 5 MG tablet Take 1 tablet (5 mg total) by mouth daily. 90 tablet 3   famotidine (PEPCID) 20 MG tablet Take 20 mg by mouth 2 (two) times daily.     finasteride (PROSCAR) 5 MG tablet TAKE 1 TABLET EVERY DAY (Patient taking differently: Take 5 mg by mouth daily.) 90 tablet 1   furosemide (LASIX) 40 MG tablet Take 1 tablet (40 mg total) by mouth daily. 30 tablet 1   gabapentin (NEURONTIN) 300 MG capsule TAKE 1 CAPSULE TWICE DAILY (Patient taking differently: Take 300 mg by mouth 2 (two) times daily.) 180 capsule 3   loperamide (IMODIUM) 2 MG capsule Take 1 capsule (2 mg total) by mouth 3 (three) times daily between meals as needed for diarrhea or loose stools. 30 capsule 0   Multiple Vitamin (MULTIVITAMIN WITH MINERALS) TABS tablet Take 1 tablet by mouth daily. 30 tablet 0   Polyvinyl Alcohol-Povidone (REFRESH OP) Place 1 drop into both eyes at bedtime.     sacubitril-valsartan (ENTRESTO) 24-26 MG Take 1 tablet by mouth 2 (two) times daily. 180 tablet 3   spironolactone (ALDACTONE) 50 MG tablet Take 1.5 tablets (75 mg total) by mouth daily. 30 tablet 1   sulfamethoxazole-trimethoprim (BACTRIM DS) 800-160 MG tablet Take 1 tablet by mouth daily. 30 tablet 2   tamsulosin (FLOMAX) 0.4 MG CAPS capsule TAKE 1 CAPSULE EVERY DAY (Patient taking differently: Take 0.4 mg by mouth daily.) 90 capsule 1   TRUE METRIX BLOOD GLUCOSE TEST test strip TEST BLOOD SUGAR  UP  TO FOUR TIMES DAILY AS DIRECTED 300 strip 3   TRUEplus Lancets 28G MISC TEST BLOOD SUGAR  UP  TO FOUR TIMES DAILY AS DIRECTED 100 each 3   rosuvastatin (CRESTOR) 5 MG tablet TAKE 1 TABLET  EVERY DAY 90 tablet 3   No current facility-administered medications for this visit.     Objective:  BP (!) 98/58    Pulse 97    Temp (!) 97.1 F (36.2 C)    Ht '5\' 11"'  (1.803 m)    Wt 169 lb 12.8 oz (77 kg)    SpO2 94%    BMI 23.68 kg/m  Gen: NAD, resting comfortably CV: RRR unchanged murmur Lungs: CTAB no crackles, wheeze, rhonchi Abdomen: Still with distended abdomen improved from prior after paracentesis yesterday Ext: no edema Skin: warm, dry     Assessment and Plan    #Hospital follow-up for sepsis related to bacterial peritonitis S:  Patient with history of decompensated cirrhosis with ascites chronically with acute flare-patient had paracentesis 2 days prior to admission and also required paracentesis at time of discharge with 4 L drained on 11/07/2020.  On initial paracentesis PMN of 895 outpatient.  He was treated with Rocephin for 5 days then switch to Septra for long-term prophylaxis.  He also required for doses of abdomen during hospitalization.  Cytology showed no atypical cells-only inflammatory cells -Baseline thrombocytopenia was noted related to liver cirrhosis - now scheduled for every 2 weeks to see how he does and he will also receive albumin with this  He was also treated for UTI-found to have E. coli and Pseudomonas-therefore Rocephin was changed to cefepime during hospitalization  He did have hyperkalemia on labs 11/10/2021-Dr. Pyrtle adjusted spironolactone to 50 mg from 75 mg with plan for 1 week repeat blood work A/P: Patient is doing reasonably well-I think scheduling every 2 weeks on paracentesis and the bacterial peritonitis prophylaxis will hopefully reduce hospitalization.  Also is going to have 1 month follow-up with GI - Patient does have portal hypertension and grade 1 varices-we will continue the carvedilol-with blood pressure running low consider reducing this but I am going to reach out to both cardiology and GI to see if they have any thoughts-each of  his medicines have an important role so difficult for me to decrease without their input -Plan for GI note given hyperkalemia was for repeat labs-I see that these are ordered-considered updating labs today in office but our lab is closed by the time of our visit  #Hepatocellular carcinoma-following with Dr. Irene Limbo S: Not a surgical candidate.  Patient has undergone radiation treatment. A/P: Patient has upcoming MRI on 01/13/2022-we will follow along for results  # Diabetes S: With weight loss of all medications Lab Results  Component Value Date   HGBA1C 5.4 11/03/2021   HGBA1C 5.2 07/13/2021   HGBA1C 6.5 (A) 12/15/2020  A/P: A1c well controlled while hospitalized-too soon for repeat  #/Congestive heart failure with EF 20 to 25% but improved to 55% with medical therapy/hypertension/CKD stage III S: Post heart failure hospitalization patient compliant with carvedilol, Lasix 40 mg, Entresto started by cardiology.  -Prior to heart failure:  amlodipine 70m, lisinopril 2.520m Today, no increased chest pain or shortness of breath reported  Chronic kidney disease has been stable with GFR in the 50s BP Readings from Last 3 Encounters:  11/16/21 (!) 98/58  11/15/21 (!) 98/50  11/10/21 120/64  A/P: CHF appears stable with improved EF  Blood pressure has been running lower the last 2 days-patient denies feeling poorly overall beyond his baseline-strongly suspect he needs Lasix and spironolactone for diuresis, needs carvedilol for variceal hemorrhage prophylaxis though considered reducing dose.  Has had substantial improvement in EF on Entresto so did not feel we can stop/change this medication-I am going to reach out to GI and cardiology for their opinion-since he feels reasonably well today I opted not to change anything without their input  #hyperlipidemia S: compliant with rosuvastatin 1034mLDL goal under 70 Lab Results  Component Value Date   CHOL 120 11/07/2019   HDL 45.60 11/07/2019    LDLCALC 49 11/07/2019   LDLDIRECT 57.0 10/22/2018   TRIG 126.0 11/07/2019   CHOLHDL 3 11/07/2019  A/P: Lipids previously well controlled and with ongoing liver issues-opted to reduce rosuvastatin to 5 mg and recheck lipids at follow-up-has had advanced coronary artery calcifications some hesitant to eliminate completely but did ask GI and cardiology opinion  #  Depression Well controlled with Lexapro 5 mg started in 2021 -patient denies any sleep issues which was primary reason he was using amitriptyline Patient stopped taking Prozac in 2020 or 2021.  Previously been on amitriptyline 50 mg but we titrated off due to fall risk/age/potential memory concerns Depression screen [facility] 2/9 09/10/2021 07/28/2021 05/14/2021  Decreased Interest 0 3 0  Down, Depressed, Hopeless 1 3 0  PHQ - 2 Score 1 6 0  Altered sleeping 0 0 0  Tired, decreased energy 1 3 0  Change in appetite 1 3 0  Feeling bad or failure about yourself  0 0 0  Trouble concentrating 0 0 0  Moving slowly or fidgety/restless 0 0 0  Suicidal thoughts 0 0 0  PHQ-9 Score 3 12 0  Difficult doing work/chores - Somewhat difficult Not difficult at all  Some recent data might be hidden  A/P: Reasonable control considering all the medical issues he is facing-continue current medication  Recommended follow up: Return for next already scheduled visit. Future Appointments  Date Time Provider [place]  11/17/2021  3:20 PM Brunetta Genera, MD [facility] None  12/03/2021 11:15 AM Felipa Furnace, DPM [facility]  12/21/2021  3:30 PM Marzetta Board, DPM [facility]  12/21/2021  4:00 PM Rex Kras, Olevia Bowens [facility]  12/23/2021 11:00 AM Pyrtle, Lajuan Lines, MD [facility]  02/04/2022  1:40 PM [name], MD [facility]  02/07/2022  3:20 PM Satira Sark, MD [facility]  09/23/2022  2:30 PM [facility] HEALTH COACH [facility]    Lab/Order associations:   ICD-10-CM   1. Alcoholic  cirrhosis of liver with ascites ([place])  K70.31     2. Hyperlipidemia, unspecified hyperlipidemia type  E78.5 rosuvastatin (CRESTOR) 5 MG tablet    3. Hepatocellular carcinoma ([place])  C22.0     4. Controlled type 2 diabetes mellitus with diabetic polyneuropathy, without long-term current use of insulin (HCC)  E11.42     5. Chronic systolic heart failure (H[facility])  I50.22     6. Essential hypertension  I10     7. Recurrent major depressive disorder, in full remission ([place])  F33.42       Meds ordered this encounter  Medications   rosuvastatin (CRESTOR) 5 MG tablet    Sig: TAKE 1 TABLET EVERY DAY    Dispense:  90 tablet    Refill:  3    Time Spent: 41 minutes of total time (12:34 PM-12:15 PM) was spent on the date of the encounter performing the following actions: chart review prior to seeing the patient, obtaining history, performing a medically necessary exam, counseling on the treatment plan, placing orders, and documenting in our EHR.   Return precautions advised.  [name], MD

## 2021-11-17 ENCOUNTER — Inpatient Hospital Stay: Payer: Medicare HMO | Attending: Hematology | Admitting: Hematology

## 2021-11-17 DIAGNOSIS — D696 Thrombocytopenia, unspecified: Secondary | ICD-10-CM | POA: Insufficient documentation

## 2021-11-17 DIAGNOSIS — C220 Liver cell carcinoma: Secondary | ICD-10-CM

## 2021-11-17 DIAGNOSIS — Z79899 Other long term (current) drug therapy: Secondary | ICD-10-CM | POA: Diagnosis not present

## 2021-11-17 DIAGNOSIS — D500 Iron deficiency anemia secondary to blood loss (chronic): Secondary | ICD-10-CM

## 2021-11-17 DIAGNOSIS — E538 Deficiency of other specified B group vitamins: Secondary | ICD-10-CM | POA: Diagnosis not present

## 2021-11-17 DIAGNOSIS — R188 Other ascites: Secondary | ICD-10-CM | POA: Insufficient documentation

## 2021-11-17 DIAGNOSIS — Z87891 Personal history of nicotine dependence: Secondary | ICD-10-CM | POA: Insufficient documentation

## 2021-11-17 LAB — PATHOLOGIST SMEAR REVIEW

## 2021-11-19 ENCOUNTER — Telehealth: Payer: Self-pay | Admitting: Hematology

## 2021-11-19 NOTE — Telephone Encounter (Signed)
Scheduled follow-up appointment per 2/8 los. Patient is aware. 

## 2021-11-20 LAB — CULTURE, BODY FLUID W GRAM STAIN -BOTTLE
Culture: NO GROWTH
Special Requests: ADEQUATE

## 2021-11-23 ENCOUNTER — Encounter: Payer: Self-pay | Admitting: Hematology

## 2021-11-23 NOTE — Progress Notes (Addendum)
HEMATOLOGY/ONCOLOGY PHONE VISIT NOTE  Date of Service:.11/17/2021   Patient Care Team: [name], MD as PCP - General (Family Medicine) Satira Sark, MD as PCP - Cardiology (Cardiology) Marilynne Halsted, MD as Referring Physician (Ophthalmology) Brunetta Genera, MD as Consulting Physician (Hematology) Pyrtle, Lajuan Lines, MD as Consulting Physician (Gastroenterology) Marzetta Board, DPM as Consulting Physician (Podiatry) Madelin Rear, [facility] as Pharmacist (Pharmacist)  CHIEF COMPLAINTS/PURPOSE OF CONSULTATION:  F/u for [facility]  HISTORY OF PRESENTING ILLNESS:   [name] is a wonderful 81 y.o. 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 He is accompanied today by his wife. The pt reports that he is doing well overall.   The pt reports that he has had low energy levels from the past 3-4 years since he retired. Today he feels fatigued as well.  He has been taking Metformin for the last 10 years and has recently begun replacing Vitamin B12. He has neuropathy in his feet and has experienced some falls and uses a cane at night. He is also taking Finasteride  Most recent lab results (02/28/18) of CBC  is as follows: all values are WNL except for RBC at 3.42, HGB at 11.4, HCT at 33.5, Platelets at 127k. Testosterone 02/13/18 was lower at 185  On review of systems, pt reports neuropathy in his feet, some fatigue, stable back pain, some ankle swelling, and denies abdominal pains and any other symptoms.   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   Interval History:   I connected with [name] on 11/17/2021 at  3:20 PM EST by telephone visit and verified that I am speaking with the correct person using two identifiers.   I discussed the limitations,  risks, security and privacy concerns of performing an evaluation and management service by telemedicine and the availability of in-person appointments. I also discussed with the patient that there may be a patient responsible charge related to this service. The patient expressed understanding and agreed to proceed.   Other persons participating in the visit and their role in the encounter: Patient's wife  Patients location: Home Providers location: [place]  Chief Complaint: Review of MRI result for follow-up of [facility]  Patient notes he is feeling about the same.  He continues to have issues with ascites requiring repeated paracentesis.  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 Billie Lade. MRI abdomen with and without contrast on 11/13/2021 showed no overt evidence of new HCC progression or new lesions. We discussed and patient wants to continue to take a conservative approach.  10 Point review of Systems was done is negative except as noted above.   MEDICAL HISTORY:  Past Medical History:  Diagnosis Date   Benign prostatic hypertrophy    Blood transfusion without reported diagnosis    CHF (congestive heart failure) ([place])    a. EF 20-25% by echo in 11/2019   COPD (chronic obstructive pulmonary disease) ([place]) 12/15/2009   FEV1 2.30 (70%) ratio 63 no better with B2 and DLCO 18/6 (73%) corrects to 106%   Degenerative joint disease    Depression    Diverticulosis    Esophageal varices (HCC)    Essential hypertension    ACE inhibitor cough   Fatty liver  Hepatocellular carcinoma ([place])    Hyperlipidemia    Internal hemorrhoids    Liver cancer ([place])    Low back pain    Otitis externa 07/30/2012   Portal hypertensive gastropathy (HCC)    Splenomegaly    Thrombocytopenia (HCC)    Type 2 diabetes mellitus ([place])    Ulcer of foot ([place])    Right foot    SURGICAL HISTORY: Past Surgical History:  Procedure Laterality Date   BIOPSY  07/14/2021   Procedure: BIOPSY;  Surgeon:  Daryel November, MD;  Location: [facility] ENDOSCOPY;  Service: Gastroenterology;;  EGD and COLON   COLONOSCOPY  2020   JMP-MAC-plenvu (good)-polyps-recall 1 yr   COLONOSCOPY WITH PROPOFOL N/A 07/14/2021   Procedure: COLONOSCOPY WITH PROPOFOL;  Surgeon: Daryel November, MD;  Location: [place];  Service: Gastroenterology;  Laterality: N/A;   ESOPHAGOGASTRODUODENOSCOPY (EGD) WITH PROPOFOL N/A 07/14/2021   Procedure: ESOPHAGOGASTRODUODENOSCOPY (EGD) WITH PROPOFOL;  Surgeon: Daryel November, MD;  Location: [place];  Service: Gastroenterology;  Laterality: N/A;   IR EMBO TUMOR ORGAN ISCHEMIA INFARCT INC GUIDE ROADMAPPING  06/01/2020   IR PARACENTESIS  08/30/2021   IR PARACENTESIS  09/22/2021   IR PARACENTESIS  10/12/2021   IR PARACENTESIS  11/02/2021   IR PARACENTESIS  11/15/2021   IR PARACENTESIS  11/30/2021   IR RADIOLOGIST EVAL & MGMT  04/28/2020   IR RADIOLOGIST EVAL & MGMT  07/15/2020   IR RADIOLOGIST EVAL & MGMT  05/04/2021   NO PAST SURGERIES  1980    SOCIAL HISTORY: Social History   Socioeconomic History   Marital status: Unknown    Spouse name: Not on file   Number of children: Not on file   Years of education: Not on file   Highest education level: Not on file  Occupational History   Occupation: retired    Comment: maintenence work  Tobacco Use   Smoking status: Former    Packs/day: 4.00    Years: 20.00    Pack years: 80.00    Types: Cigarettes    Quit date: 09/18/1984    Years since quitting: 37.2   Smokeless tobacco: Never   Tobacco comments:    quit in 1980  Vaping Use   Vaping Use: Never used  Substance and Sexual Activity   Alcohol use: No    Comment: no drinking since 40 years plus   Drug use: No   Sexual activity: Yes  Other Topics Concern   Not on file  Social History Narrative   ** Merged History Encounter **       Married 53 years in 2015. 4 kids (2 sons) and 3 grandkids.    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            Social Determinants of Radio broadcast assistant Strain: Low Risk    Difficulty of Paying Living Expenses: Not hard at all  Food Insecurity: No Food Insecurity   Worried About Charity fundraiser in the Last Year: Never true   Arboriculturist in the Last Year: Never true  Transportation Needs: No Transportation Needs   Lack of Transportation (Medical): No   Lack of Transportation (Non-Medical): No  Physical Activity: Inactive   Days of Exercise per Week: 0 days   Minutes of Exercise per Session: 0 min  Stress: No Stress Concern Present  Feeling of Stress : Only a little  Social Connections: Moderately Integrated   Frequency of Communication with Friends and Family: More than three times a week   Frequency of Social Gatherings with Friends and Family: More than three times a week   Attends Religious Services: More than 4 times per year   Active Member of [employer] or Organizations: No   Attends Music therapist: Never   Marital Status: Married  Human resources officer Violence: Not At Risk   Fear of Current or Ex-Partner: No   Emotionally Abused: No   Physically Abused: No   Sexually Abused: No    FAMILY HISTORY: Family History  Problem Relation Age of Onset   Melanoma Mother    Stroke Father    CAD Brother        Premature disease   Colon polyps Neg Hx    Colon cancer Neg Hx    Esophageal cancer Neg Hx    Stomach cancer Neg Hx    Rectal cancer Neg Hx     ALLERGIES:  has No Known Allergies.  MEDICATIONS:  Current Outpatient Medications  Medication Sig Dispense Refill   albuterol (VENTOLIN HFA) 108 (90 Base) MCG/ACT inhaler Inhale 2 puffs into the lungs every 6 (six) hours as needed for wheezing or shortness of breath. 18 g 3   Alcohol Swabs (DROPSAFE ALCOHOL PREP) 70 % PADS USE  UP  TO FOUR TIMES DAILY AS DIRECTED 400 each 3   aspirin EC 81 MG tablet Take 81 mg by mouth daily.      Blood Glucose Monitoring Suppl (TRUE METRIX AIR GLUCOSE METER) w/Device KIT Use to check blood sugars daily. Dx: E11.9 1 kit 3   busPIRone (BUSPAR) 7.5 MG tablet Take 1 tablet (7.5 mg total) by mouth 2 (two) times daily as needed. for anxiety (Patient taking differently: Take 7.5 mg by mouth at bedtime.) 180 tablet 3   carvedilol (COREG) 6.25 MG tablet Take 1 tablet (6.25 mg total) by mouth 2 (two) times daily with a meal. 180 tablet 1   escitalopram (LEXAPRO) 5 MG tablet Take 1 tablet (5 mg total) by mouth daily. 90 tablet 3   famotidine (PEPCID) 20 MG tablet Take 20 mg by mouth 2 (two) times daily.     finasteride (PROSCAR) 5 MG tablet TAKE 1 TABLET EVERY DAY (Patient taking differently: Take 5 mg by mouth daily.) 90 tablet 1   furosemide (LASIX) 40 MG tablet Take 1 tablet (40 mg total) by mouth daily. 30 tablet 1   gabapentin (NEURONTIN) 300 MG capsule TAKE 1 CAPSULE TWICE DAILY (Patient taking differently: Take 300 mg by mouth 2 (two) times daily.) 180 capsule 3   loperamide (IMODIUM) 2 MG capsule Take 1 capsule (2 mg total) by mouth 3 (three) times daily between meals as needed for diarrhea or loose stools. 30 capsule 0   Multiple Vitamin (MULTIVITAMIN WITH MINERALS) TABS tablet Take 1 tablet by mouth daily. 30 tablet 0   Polyvinyl Alcohol-Povidone (REFRESH OP) Place 1 drop into both eyes at bedtime.     rosuvastatin (CRESTOR) 5 MG tablet TAKE 1 TABLET EVERY DAY 90 tablet 3   sacubitril-valsartan (ENTRESTO) 24-26 MG Take 1 tablet by mouth 2 (two) times daily. 180 tablet 3   spironolactone (ALDACTONE) 50 MG tablet Take 1.5 tablets (75 mg total) by mouth daily. 30 tablet 1   sulfamethoxazole-trimethoprim (BACTRIM DS) 800-160 MG tablet Take 1 tablet by mouth daily. 30 tablet 2   tamsulosin (FLOMAX) 0.4 MG CAPS  capsule TAKE 1 CAPSULE EVERY DAY (Patient taking differently: Take 0.4 mg by mouth daily.) 90 capsule 1   TRUE METRIX BLOOD GLUCOSE TEST test strip TEST BLOOD SUGAR  UP  TO FOUR TIMES  DAILY AS DIRECTED 300 strip 3   TRUEplus Lancets 28G MISC TEST BLOOD SUGAR  UP  TO FOUR TIMES DAILY AS DIRECTED 400 each 0   No current facility-administered medications for this visit.    PHYSICAL EXAMINATION:  Telemedicine visit.  LABORATORY DATA:  I have reviewed the data as listed . CBC Latest Ref Rng & Units 11/10/2021 11/04/2021 11/04/2021  WBC 4.0 - 10.5 K/uL 7.0 5.2 -  Hemoglobin 13.0 - 17.0 g/dL 9.9(L) 8.8(L) 9.0(L)  Hematocrit 39.0 - 52.0 % 29.3(L) 26.6(L) 28.1(L)  Platelets 150.0 - 400.0 K/uL 169.0 119(L) -   . CMP Latest Ref Rng & Units 11/10/2021 11/04/2021 11/02/2021  Glucose 70 - 99 mg/dL 116(H) 103(H) 194(H)  BUN 6 - 23 mg/dL 33(H) 17 22  Creatinine 0.40 - 1.50 mg/dL 1.25 0.96 1.16  Sodium 135 - 145 mEq/L 137 137 137  Potassium 3.5 - 5.1 mEq/L 5.3 No hemolysis seen(H) 4.3 4.1  Chloride 96 - 112 mEq/L 103 104 101  CO2 19 - 32 mEq/L _0 Calcium 8.4 - 10.5 mg/dL 9.3 8.5(L) 8.5(L)  Total Protein 6.5 - 8.1 g/dL - - 5.9(L)  Total Bilirubin 0.3 - 1.2 mg/dL - - 0.9  Alkaline Phos 38 - 126 U/L - - 240(H)  AST 15 - 41 U/L - - 47(H)  ALT 0 - 44 U/L - - 25       02/28/18 Surgical Pathology:   02/28/18 Cytogenetics:    RADIOGRAPHIC STUDIES: I have personally reviewed the radiological images as listed and agreed with the findings in the report.  MR Abdomen W Wo Contrast  Result Date: 11/15/2021 CLINICAL DATA:  Follow-up hepatocellular carcinoma. Evaluate treatment response. EXAM: MRI ABDOMEN WITHOUT AND WITH CONTRAST TECHNIQUE: Multiplanar multisequence MR imaging of the abdomen was performed both before and after the administration of intravenous contrast. CONTRAST:  35m GADAVIST GADOBUTROL 1 MMOL/ML IV SOLN COMPARISON:  07/01/2021 FINDINGS: Lower chest: No acute findings. Hepatobiliary: Hepatic cirrhosis again demonstrated. Post treatment changes again seen in segment 4. A lesion showing thin peripheral rim enhancement is again seen in segment 4A of the left hepatic  lobe, measuring 2.0 x 1.2 cm on image 37/20. This is unchanged since previous study. A small lesion in segment 3 also shows mild peripheral enhancement. This measures 10 x 9 mm on image 43/27, and shows decreased size and enhancement compared to previous study when it measured 1.6 x 1.5 cm. No new or enlarging liver masses identified. Prior cholecystectomy. No evidence of biliary obstruction. Pancreas: No mass or inflammatory changes. Spleen: Stable mild splenomegaly with length measuring approximately 14 cm. Stable tiny benign cyst again seen in the anterior aspect of the spleen. Adrenals/Urinary Tract: No masses identified. Stable small right renal cyst. No evidence of hydronephrosis. Stomach/Bowel: Diverticulosis involving visualized portion of ascending and transverse colon, without evidence of diverticulitis in these regions. Otherwise unremarkable. Vascular/Lymphatic: No pathologically enlarged lymph nodes identified. Left upper quadrant portosystemic venous collaterals again seen, consistent with portal venous hypertension. No acute vascular findings. Other: Moderate ascites, without significant change since prior exam. Musculoskeletal:  No suspicious bone lesions identified. IMPRESSION: Stable treated lesion in segment 4A of the left hepatic lobe, showing thin peripheral rim enhancement. Decreased size and enhancement of previously seen mass in segment 3 of the  left hepatic lobe. No evidence of new or enlarging liver masses. 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 Moderate ascites, without significant change. Electronically Signed   By: Marlaine Hind M.D.   On: 11/15/2021 15:13    ASSESSMENT & PLAN:   81 y.o. male with  1. 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 02/28/18 surgical pathology report with no overt pathology, could not rule out MDS, and noted an increase in ring  sideroblasts 02/28/18 BM cytogenetics report was unremarkable  2. 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 Hepatocellular Carcinoma -- likely related to liver cirrhosis -02/14/2020 CT Abd/Pel ([id number]) revealed "1. 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 2. 4 cm hypervascular mass in segment 4A of left hepatic lobe, and 8 mm hypervascular nodule in posterior right hepatic lobe. These are highly suspicious for hepatocellular carcinomas in the setting of cirrhosis. 3. No evidence of metastatic disease." -03/27/2020 MRI Abdomen ([id number]) revealed "1. The dominant lesion of concern in the medial segment of the left hepatic lobe (segment 4 A) is diagnostic of hepatocellular carcinoma (LR 5). 2. The other small lesion questioned inferiorly in the right lobe is not clearly seen, although evaluation of this area is mildly limited by motion artifact."  Patient is status post SBRT to 2 HCC lesions on the liver PLAN: -MRI of the liver 11/13/2021 was discussed with the patient in detail and showed table treated lesion in segment 4A of the left hepatic lobe, showing thin peripheral rim enhancement. Decreased size and enhancement of previously seen mass in segment 3 of the left hepatic lobe. No evidence of new or enlarging liver masses.  -He continues to follow with Dr. Hilarie Fredrickson for continued evaluation and management of his liver cirrhosis and its complications including ascites.  FOLLOW UP: RTC with Dr Irene Limbo with labs in 10 weeks  All of the patient's questions were answered with apparent satisfaction. The patient knows to call the clinic with any problems, questions or concerns.  Brunetta Genera MD ..

## 2021-11-26 ENCOUNTER — Other Ambulatory Visit: Payer: Self-pay | Admitting: Family Medicine

## 2021-11-26 ENCOUNTER — Telehealth: Payer: Self-pay | Admitting: Pharmacist

## 2021-11-26 NOTE — Progress Notes (Signed)
Chronic Care Management Pharmacy Assistant   Name: [name]  MRN: [id number] DOB: 1940-10-14   Reason for Encounter: General Adherence Call    Recent office visits:  11/16/2021 OV (PCP) [name], MD; Blood pressure has been running lower the last 2 days-patient denies feeling poorly overall beyond his baseline-strongly suspect he needs Lasix and spironolactone for diuresis, needs carvedilol for variceal hemorrhage prophylaxis though considered reducing dose.  Has had substantial improvement in EF on Entresto so did not feel we can stop/change this medication-I am going to reach out to GI and cardiology for their opinion-since he feels reasonably well today I opted not to change anything without their input  11/01/2021 OV (PCP) [name], MD;  With chronic low back pain and painful diabetic neuropathy we opted to try oxycodone short-term-  Recent consult visits:  11/17/2021 OV (Oncology) Brunetta Genera, MD; office visit note unavailable.  11/10/2021 OV (Cardiology) Satira Sark, MD; no medication changes indicated.  11/10/2021 OV Gertie Fey) Pyrtle, Lajuan Lines, MD; --Will continue diuretics but I am having to decrease the spironolactone to 50 mg daily due to mild increase in potassium and serum creatinine.;  Continue furosemide 40 mg daily for now  10/22/2021 OV Gertie Fey) Noralyn Pick, NP; no medication changes indicated.  10/13/2021 OV (Oncology) Brunetta Genera, MD; no medication changes indicated.  10/08/2021 OV (Podiatry) Felipa Furnace, DPM; no medication changes indicated.  Hospital visits:  11/02/2021 ED to Hospital Admission due to SBP Admit date: 11/02/2021 Discharge date: 11/07/2021 -Recommendation per GI to continue with IV Rocephin x5 days, then to switch to Septra DS.  For prophylaxis.  Was treated with Rocephin, and cefepime during hospital stay, and he will be discharged on Septra DS 1 tablet daily per GI  recommendation  STOP taking these medications: Acetaminophen 500 mg Oxycodone 5 mg  Medications: Outpatient Encounter Medications as of 11/26/2021  Medication Sig   albuterol (VENTOLIN HFA) 108 (90 Base) MCG/ACT inhaler Inhale 2 puffs into the lungs every 6 (six) hours as needed for wheezing or shortness of breath.   Alcohol Swabs (DROPSAFE ALCOHOL PREP) 70 % PADS USE  UP  TO FOUR TIMES DAILY AS DIRECTED   aspirin EC 81 MG tablet Take 81 mg by mouth daily.   Blood Glucose Monitoring Suppl (TRUE METRIX AIR GLUCOSE METER) w/Device KIT Use to check blood sugars daily. Dx: E11.9   busPIRone (BUSPAR) 7.5 MG tablet Take 1 tablet (7.5 mg total) by mouth 2 (two) times daily as needed. for anxiety (Patient taking differently: Take 7.5 mg by mouth at bedtime.)   carvedilol (COREG) 6.25 MG tablet Take 1 tablet (6.25 mg total) by mouth 2 (two) times daily with a meal.   escitalopram (LEXAPRO) 5 MG tablet Take 1 tablet (5 mg total) by mouth daily.   famotidine (PEPCID) 20 MG tablet Take 20 mg by mouth 2 (two) times daily.   finasteride (PROSCAR) 5 MG tablet TAKE 1 TABLET EVERY DAY (Patient taking differently: Take 5 mg by mouth daily.)   furosemide (LASIX) 40 MG tablet Take 1 tablet (40 mg total) by mouth daily.   gabapentin (NEURONTIN) 300 MG capsule TAKE 1 CAPSULE TWICE DAILY (Patient taking differently: Take 300 mg by mouth 2 (two) times daily.)   loperamide (IMODIUM) 2 MG capsule Take 1 capsule (2 mg total) by mouth 3 (three) times daily between meals as needed for diarrhea or loose stools.   Multiple Vitamin (MULTIVITAMIN WITH MINERALS) TABS tablet Take  1 tablet by mouth daily.   Polyvinyl Alcohol-Povidone (REFRESH OP) Place 1 drop into both eyes at bedtime.   rosuvastatin (CRESTOR) 5 MG tablet TAKE 1 TABLET EVERY DAY   sacubitril-valsartan (ENTRESTO) 24-26 MG Take 1 tablet by mouth 2 (two) times daily.   spironolactone (ALDACTONE) 50 MG tablet Take 1.5 tablets (75 mg total) by mouth daily.    sulfamethoxazole-trimethoprim (BACTRIM DS) 800-160 MG tablet Take 1 tablet by mouth daily.   tamsulosin (FLOMAX) 0.4 MG CAPS capsule TAKE 1 CAPSULE EVERY DAY (Patient taking differently: Take 0.4 mg by mouth daily.)   TRUE METRIX BLOOD GLUCOSE TEST test strip TEST BLOOD SUGAR  UP  TO FOUR TIMES DAILY AS DIRECTED   TRUEplus Lancets 28G MISC TEST BLOOD SUGAR  UP  TO FOUR TIMES DAILY AS DIRECTED   No facility-administered encounter medications on file as of 11/26/2021.   Recent Relevant Labs: Lab Results  Component Value Date/Time   HGBA1C 5.4 11/03/2021 06:04 PM   HGBA1C 5.2 07/13/2021 03:18 AM   MICROALBUR 60.3 (H) 01/29/2018 12:52 PM   MICROALBUR 7.8 (H) 12/01/2015 09:40 AM    Kidney Function Lab Results  Component Value Date/Time   CREATININE 1.25 11/10/2021 10:01 AM   CREATININE 0.96 11/04/2021 04:19 AM   CREATININE 0.94 10/13/2021 09:09 AM   CREATININE 0.98 07/06/2021 01:19 PM   GFR 54.28 (L) 11/10/2021 10:01 AM   GFRNONAA >60 11/04/2021 04:19 AM   GFRNONAA >60 10/13/2021 09:09 AM   GFRAA 59 (L) 06/09/2020 08:59 AM    Current antihyperglycemic regimen:  None currently  What recent interventions/DTPs have been made to improve glycemic control:  Patient has recently been discontinued from taking Metformin.  Have there been any recent hospitalizations or ED visits since last visit with CPP? Yes  Patient denies hypoglycemic symptoms.  Patient denies hyperglycemic symptoms.  How often are you checking your blood sugar? once daily  What are your blood sugars ranging?  Fasting: 130, 140  During the week, how often does your blood glucose drop below 70? Never  Are you checking your feet daily/regularly?   Adherence Review: Is the patient currently on a STATIN medication? Yes Is the patient currently on ACE/ARB medication? Yes Does the patient have >5 day gap between last estimated fill dates? No   Care Gaps: Medicare Annual Wellness: Completed 09/2021 Ophthalmology  Exam: Overdue since 01/25/2019 Foot Exam: Next due on 05/14/2022 Hemoglobin A1C: 5.4% on 07/13/2021 Colonoscopy: Next due on 07/14/2022  Future Appointments  Date Time Provider [place]  12/03/2021 11:15 AM Felipa Furnace, DPM [facility]  12/21/2021  3:30 PM Marzetta Board, DPM [facility]  12/21/2021  4:00 PM Little, Aaron Edelman T [facility]  12/23/2021 11:00 AM Pyrtle, Lajuan Lines, MD [facility]  01/27/2022 11:00 AM [facility] None  01/27/2022 11:40 AM Brunetta Genera, MD [facility] None  02/04/2022  1:40 PM [name], MD [facility]  02/07/2022  3:20 PM Satira Sark, MD [facility]  09/23/2022  2:30 PM [facility] HEALTH COACH [facility]    Star Rating Drugs: Rosuvastatin 5 mg last filled 11/16/2021 90 DS Entresto 24-26 mg last filled 10/22/2021 90 DS Metformin 500 mg last filled 11/02/2021 90 DS  April D Calhoun, [place] Pharmacist Assistant [phone]

## 2021-11-30 ENCOUNTER — Other Ambulatory Visit: Payer: Self-pay

## 2021-11-30 ENCOUNTER — Ambulatory Visit (HOSPITAL_COMMUNITY)
Admission: RE | Admit: 2021-11-30 | Discharge: 2021-11-30 | Disposition: A | Discharge status: Home / Self Care | Payer: Medicare HMO | Source: Ambulatory Visit | Attending: Internal Medicine | Admitting: Internal Medicine

## 2021-11-30 DIAGNOSIS — K7031 Alcoholic cirrhosis of liver with ascites: Secondary | ICD-10-CM | POA: Insufficient documentation

## 2021-11-30 DIAGNOSIS — R188 Other ascites: Secondary | ICD-10-CM | POA: Diagnosis not present

## 2021-11-30 HISTORY — PX: IR PARACENTESIS: IMG2679

## 2021-11-30 LAB — BODY FLUID CELL COUNT WITH DIFFERENTIAL
Eos, Fluid: 0 %
Lymphs, Fluid: 30 %
Monocyte-Macrophage-Serous Fluid: 60 % (ref 50–90)
Neutrophil Count, Fluid: 10 % (ref 0–25)
Total Nucleated Cell Count, Fluid: 284 cu mm (ref 0–1000)

## 2021-11-30 MED ORDER — LIDOCAINE HCL 1 % IJ SOLN
INTRAMUSCULAR | Status: DC | PRN
  Administered 2021-11-30: 5 mL via INTRADERMAL

## 2021-11-30 MED ORDER — ALBUMIN HUMAN 25 % IV SOLN
50.0000 g | Freq: Once | INTRAVENOUS | Status: DC
  Filled 2021-11-30: qty 200

## 2021-11-30 MED ORDER — LIDOCAINE HCL 1 % IJ SOLN
INTRAMUSCULAR | Status: AC
  Filled 2021-11-30: qty 20

## 2021-12-02 LAB — PATHOLOGIST SMEAR REVIEW

## 2021-12-03 ENCOUNTER — Other Ambulatory Visit: Payer: Self-pay

## 2021-12-03 ENCOUNTER — Ambulatory Visit (INDEPENDENT_AMBULATORY_CARE_PROVIDER_SITE_OTHER): Payer: Medicare HMO | Admitting: Podiatry

## 2021-12-03 DIAGNOSIS — E1142 Type 2 diabetes mellitus with diabetic polyneuropathy: Secondary | ICD-10-CM

## 2021-12-03 DIAGNOSIS — T148XXA Other injury of unspecified body region, initial encounter: Secondary | ICD-10-CM

## 2021-12-09 NOTE — Progress Notes (Signed)
Subjective:  Patient ID: [name], male    DOB: Nov 25, 1940,  MRN: [id number]  Chief Complaint  Patient presents with   Callouses    Right foot wound check     81 y.o. male presents with the above complaint.  Patient presents with follow right submetatarsal 1 small friction blister with underlying limited breakdown of skin.  He is diabetic.  He states that he might be ready by tomorrow.  I discussed with him he states that the weekend and leave on Monday he states understanding.   Review of Systems: Negative except as noted in the HPI. Denies N/V/F/Ch.  Past Medical History:  Diagnosis Date   Benign prostatic hypertrophy    Blood transfusion without reported diagnosis    CHF (congestive heart failure) ([place])    a. EF 20-25% by echo in 11/2019   COPD (chronic obstructive pulmonary disease) ([place]) 12/15/2009   FEV1 2.30 (70%) ratio 63 no better with B2 and DLCO 18/6 (73%) corrects to 106%   Degenerative joint disease    Depression    Diverticulosis    Esophageal varices (HCC)    Essential hypertension    ACE inhibitor cough   Fatty liver    Hepatocellular carcinoma (HCC)    Hyperlipidemia    Internal hemorrhoids    Liver cancer ([place])    Low back pain    Otitis externa 07/30/2012   Portal hypertensive gastropathy (HCC)    Splenomegaly    Thrombocytopenia (HCC)    Type 2 diabetes mellitus (HCC)    Ulcer of foot (HCC)    Right foot    Current Outpatient Medications:    albuterol (VENTOLIN HFA) 108 (90 Base) MCG/ACT inhaler, Inhale 2 puffs into the lungs every 6 (six) hours as needed for wheezing or shortness of breath., Disp: 18 g, Rfl: 3   Alcohol Swabs (DROPSAFE ALCOHOL PREP) 70 % PADS, USE  UP  TO FOUR TIMES DAILY AS DIRECTED, Disp: 400 each, Rfl: 3   aspirin EC 81 MG tablet, Take 81 mg by mouth daily., Disp: , Rfl:    Blood Glucose Monitoring Suppl (TRUE METRIX AIR GLUCOSE METER) w/Device KIT, Use to check blood sugars daily. Dx: E11.9, Disp: 1 kit, Rfl: 3    busPIRone (BUSPAR) 7.5 MG tablet, Take 1 tablet (7.5 mg total) by mouth 2 (two) times daily as needed. for anxiety (Patient taking differently: Take 7.5 mg by mouth at bedtime.), Disp: 180 tablet, Rfl: 3   carvedilol (COREG) 6.25 MG tablet, Take 1 tablet (6.25 mg total) by mouth 2 (two) times daily with a meal., Disp: 180 tablet, Rfl: 1   escitalopram (LEXAPRO) 5 MG tablet, Take 1 tablet (5 mg total) by mouth daily., Disp: 90 tablet, Rfl: 3   famotidine (PEPCID) 20 MG tablet, Take 20 mg by mouth 2 (two) times daily., Disp: , Rfl:    finasteride (PROSCAR) 5 MG tablet, TAKE 1 TABLET EVERY DAY (Patient taking differently: Take 5 mg by mouth daily.), Disp: 90 tablet, Rfl: 1   furosemide (LASIX) 40 MG tablet, Take 1 tablet (40 mg total) by mouth daily., Disp: 30 tablet, Rfl: 1   gabapentin (NEURONTIN) 300 MG capsule, TAKE 1 CAPSULE TWICE DAILY (Patient taking differently: Take 300 mg by mouth 2 (two) times daily.), Disp: 180 capsule, Rfl: 3   loperamide (IMODIUM) 2 MG capsule, Take 1 capsule (2 mg total) by mouth 3 (three) times daily between meals as needed for diarrhea or loose stools., Disp: 30 capsule, Rfl: 0  Multiple Vitamin (MULTIVITAMIN WITH MINERALS) TABS tablet, Take 1 tablet by mouth daily., Disp: 30 tablet, Rfl: 0   Polyvinyl Alcohol-Povidone (REFRESH OP), Place 1 drop into both eyes at bedtime., Disp: , Rfl:    rosuvastatin (CRESTOR) 5 MG tablet, TAKE 1 TABLET EVERY DAY, Disp: 90 tablet, Rfl: 3   sacubitril-valsartan (ENTRESTO) 24-26 MG, Take 1 tablet by mouth 2 (two) times daily., Disp: 180 tablet, Rfl: 3   spironolactone (ALDACTONE) 50 MG tablet, Take 1.5 tablets (75 mg total) by mouth daily., Disp: 30 tablet, Rfl: 1   sulfamethoxazole-trimethoprim (BACTRIM DS) 800-160 MG tablet, Take 1 tablet by mouth daily., Disp: 30 tablet, Rfl: 2   tamsulosin (FLOMAX) 0.4 MG CAPS capsule, TAKE 1 CAPSULE EVERY DAY (Patient taking differently: Take 0.4 mg by mouth daily.), Disp: 90 capsule, Rfl: 1   TRUE  METRIX BLOOD GLUCOSE TEST test strip, TEST BLOOD SUGAR  UP  TO FOUR TIMES DAILY AS DIRECTED, Disp: 300 strip, Rfl: 3   TRUEplus Lancets 28G MISC, TEST BLOOD SUGAR  UP  TO FOUR TIMES DAILY AS DIRECTED, Disp: 400 each, Rfl: 0  Social History   Tobacco Use  Smoking Status Former   Packs/day: 4.00   Years: 20.00   Pack years: 80.00   Types: Cigarettes   Quit date: 09/18/1984   Years since quitting: 37.2  Smokeless Tobacco Never  Tobacco Comments   quit in 1980    No Known Allergies Objective:  There were no vitals filed for this visit. There is no height or weight on file to calculate BMI. Constitutional Well developed. Well nourished.  Vascular Dorsalis pedis pulses palpable bilaterally. Posterior tibial pulses palpable bilaterally. Capillary refill normal to all digits.  No cyanosis or clubbing noted. Pedal hair growth normal.  Neurologic Normal speech. Oriented to person, place, and time. Epicritic sensation to light touch grossly present bilaterally.  Dermatologic Right submetatarsal 1 limited breakdown of the skin superficial ulceration noted.  Does not probe down to deep tissue no malodor present.  No clinical signs of infection noted.  Limited breakdown of the skin noted.  No deep ulceration noted.  No clinical signs of infection noted.  Orthopedic: Normal joint ROM without pain or crepitus bilaterally. No visible deformities. No bony tenderness.   Radiographs: None Assessment:   1. Friction blister   2. Diabetic peripheral neuropathy associated with type 2 diabetes mellitus ([place])     Plan:  Patient was evaluated and treated and all questions answered.  Right submetatarsal 1 blister with underlying limited breakdown of the skin in the setting of diabetes -I explained to the patient the etiology of blister formation various treatment options were discussed. -Continue using triple antibiotic and a Band-Aid.  The wound is improving slowly. -It may have been a  diabetic shoes adjustment if there is no improvement and continues to friction blister.  I discussed this with the patient he states understanding.  No follow-ups on file.

## 2021-12-14 ENCOUNTER — Ambulatory Visit (HOSPITAL_COMMUNITY)
Admission: RE | Admit: 2021-12-14 | Discharge: 2021-12-14 | Disposition: A | Discharge status: Home / Self Care | Payer: Medicare HMO | Source: Ambulatory Visit | Attending: Internal Medicine | Admitting: Internal Medicine

## 2021-12-14 ENCOUNTER — Other Ambulatory Visit: Payer: Self-pay

## 2021-12-14 DIAGNOSIS — K7031 Alcoholic cirrhosis of liver with ascites: Secondary | ICD-10-CM | POA: Insufficient documentation

## 2021-12-14 DIAGNOSIS — K7460 Unspecified cirrhosis of liver: Secondary | ICD-10-CM | POA: Diagnosis not present

## 2021-12-14 DIAGNOSIS — R188 Other ascites: Secondary | ICD-10-CM | POA: Diagnosis not present

## 2021-12-14 HISTORY — PX: IR PARACENTESIS: IMG2679

## 2021-12-14 LAB — BODY FLUID CELL COUNT WITH DIFFERENTIAL
Eos, Fluid: 1 %
Lymphs, Fluid: 5 %
Monocyte-Macrophage-Serous Fluid: 90 % (ref 50–90)
Neutrophil Count, Fluid: 4 % (ref 0–25)
Total Nucleated Cell Count, Fluid: 240 cu mm (ref 0–1000)

## 2021-12-14 LAB — GRAM STAIN

## 2021-12-14 MED ORDER — ALBUMIN HUMAN 25 % IV SOLN
INTRAVENOUS | Status: AC
  Filled 2021-12-14: qty 200

## 2021-12-14 MED ORDER — LIDOCAINE HCL 1 % IJ SOLN
INTRAMUSCULAR | Status: DC | PRN
  Administered 2021-12-14: 5 mL via INTRADERMAL

## 2021-12-14 MED ORDER — ALBUMIN HUMAN 25 % IV SOLN
50.0000 g | Freq: Once | INTRAVENOUS | Status: AC
  Administered 2021-12-14: 50 g via INTRAVENOUS

## 2021-12-14 MED ORDER — LIDOCAINE HCL 1 % IJ SOLN
INTRAMUSCULAR | Status: AC
  Filled 2021-12-14: qty 20

## 2021-12-14 NOTE — Procedures (Signed)
PROCEDURE SUMMARY: ? ?Successful US guided paracentesis from left abdomen.  ?Yielded 2.6 L of clear yellow fluid.  ?No immediate complications.  ?Pt tolerated well.  ? ?Specimen sent for labs. ? ?EBL < 2 mL ? ?Theresa Duty, NP ?12/14/2021 ?3:13 PM ? ? ? ?

## 2021-12-15 LAB — PATHOLOGIST SMEAR REVIEW

## 2021-12-17 ENCOUNTER — Encounter: Payer: Self-pay | Admitting: *Deleted

## 2021-12-19 LAB — CULTURE, BODY FLUID W GRAM STAIN -BOTTLE: Culture: NO GROWTH

## 2021-12-20 ENCOUNTER — Other Ambulatory Visit: Payer: Self-pay | Admitting: Family Medicine

## 2021-12-20 ENCOUNTER — Other Ambulatory Visit: Payer: Self-pay | Admitting: Nurse Practitioner

## 2021-12-21 ENCOUNTER — Ambulatory Visit: Payer: Medicare HMO

## 2021-12-21 ENCOUNTER — Other Ambulatory Visit: Payer: Self-pay

## 2021-12-21 ENCOUNTER — Encounter: Payer: Self-pay | Admitting: Podiatry

## 2021-12-21 ENCOUNTER — Ambulatory Visit: Payer: Medicare HMO | Admitting: Podiatry

## 2021-12-21 DIAGNOSIS — M2042 Other hammer toe(s) (acquired), left foot: Secondary | ICD-10-CM | POA: Diagnosis not present

## 2021-12-21 DIAGNOSIS — B351 Tinea unguium: Secondary | ICD-10-CM

## 2021-12-21 DIAGNOSIS — M79674 Pain in right toe(s): Secondary | ICD-10-CM | POA: Diagnosis not present

## 2021-12-21 DIAGNOSIS — M79675 Pain in left toe(s): Secondary | ICD-10-CM | POA: Diagnosis not present

## 2021-12-21 DIAGNOSIS — M2041 Other hammer toe(s) (acquired), right foot: Secondary | ICD-10-CM

## 2021-12-21 DIAGNOSIS — L84 Corns and callosities: Secondary | ICD-10-CM | POA: Diagnosis not present

## 2021-12-21 DIAGNOSIS — E1142 Type 2 diabetes mellitus with diabetic polyneuropathy: Secondary | ICD-10-CM

## 2021-12-21 DIAGNOSIS — L97412 Non-pressure chronic ulcer of right heel and midfoot with fat layer exposed: Secondary | ICD-10-CM

## 2021-12-21 NOTE — Progress Notes (Signed)
SITUATION ?Reason for Consult: Follow-up with diabetic insoles ?Patient / Caregiver Report: Patient needs additional offloading ? ?OBJECTIVE DATA ?History / Diagnosis:  ?  ICD-10-CM   ?1. Diabetic peripheral neuropathy associated with type 2 diabetes mellitus ([place])  E11.42   ?  ?2. Diabetic ulcer of right midfoot associated with diabetes mellitus due to underlying condition, with fat layer exposed ([place])  P79.480   ? X65.537   ?  ?3. Acquired hammertoes of both feet  M20.41   ? M20.42   ?  ? ? ?Change in Pathology: None ? ?ACTIONS PERFORMED ?Patient's equipment was checked for structural stability and fit. Increased offloading to wound sites bilateral Device(s) intact and fit is excellent. All questions answered and concerns addressed. ? ?PLAN ?Follow-up as needed (PRN). Plan of care discussed with and agreed upon by patient / caregiver. ? ?

## 2021-12-23 ENCOUNTER — Other Ambulatory Visit: Payer: Self-pay

## 2021-12-23 ENCOUNTER — Other Ambulatory Visit (INDEPENDENT_AMBULATORY_CARE_PROVIDER_SITE_OTHER): Payer: Medicare HMO

## 2021-12-23 ENCOUNTER — Ambulatory Visit: Payer: Medicare HMO | Admitting: Internal Medicine

## 2021-12-23 ENCOUNTER — Encounter: Payer: Self-pay | Admitting: Internal Medicine

## 2021-12-23 VITALS — BP 92/70 | HR 99 | Ht 71.0 in | Wt 173.0 lb

## 2021-12-23 DIAGNOSIS — K766 Portal hypertension: Secondary | ICD-10-CM | POA: Diagnosis not present

## 2021-12-23 DIAGNOSIS — Z8619 Personal history of other infectious and parasitic diseases: Secondary | ICD-10-CM | POA: Diagnosis not present

## 2021-12-23 DIAGNOSIS — K7031 Alcoholic cirrhosis of liver with ascites: Secondary | ICD-10-CM

## 2021-12-23 LAB — COMPREHENSIVE METABOLIC PANEL
ALT: 10 U/L (ref 0–53)
AST: 22 U/L (ref 0–37)
Albumin: 3.7 g/dL (ref 3.5–5.2)
Alkaline Phosphatase: 96 U/L (ref 39–117)
BUN: 51 mg/dL — ABNORMAL HIGH (ref 6–23)
CO2: 22 mEq/L (ref 19–32)
Calcium: 9.2 mg/dL (ref 8.4–10.5)
Chloride: 106 mEq/L (ref 96–112)
Creatinine, Ser: 2.45 mg/dL — ABNORMAL HIGH (ref 0.40–1.50)
GFR: 24.19 mL/min — ABNORMAL LOW (ref >60.00)
Glucose, Bld: 104 mg/dL — ABNORMAL HIGH (ref 70–99)
Potassium: 5.4 mEq/L — ABNORMAL HIGH (ref 3.5–5.1)
Sodium: 135 mEq/L (ref 135–145)
Total Bilirubin: 0.4 mg/dL (ref 0.2–1.2)
Total Protein: 6.4 g/dL (ref 6.0–8.3)

## 2021-12-23 LAB — PROTIME-INR
INR: 1 ratio (ref 0.8–1.0)
Prothrombin Time: 11.5 s (ref 9.6–13.1)

## 2021-12-23 LAB — CBC
HCT: 25.4 % — ABNORMAL LOW (ref 39.0–52.0)
Hemoglobin: 8.3 g/dL — ABNORMAL LOW (ref 13.0–17.0)
MCHC: 32.8 g/dL (ref 30.0–36.0)
MCV: 106.2 fl — ABNORMAL HIGH (ref 78.0–100.0)
Platelets: 98 10*3/uL — ABNORMAL LOW (ref 150.0–400.0)
RBC: 2.39 Mil/uL — ABNORMAL LOW (ref 4.22–5.81)
RDW: 19.4 % — ABNORMAL HIGH (ref 11.5–15.5)
WBC: 4.3 10*3/uL (ref 4.0–10.5)

## 2021-12-23 NOTE — Patient Instructions (Signed)
Your provider has requested that you go to the basement level for lab work before leaving today. Press "B" on the elevator. The lab is located at the first door on the left as you exit the elevator. ? ?If you are age 81 or older, your body mass index should be between 23-30. Your Body mass index is 24.13 kg/m?Marland Kitchen If this is out of the aforementioned range listed, please consider follow up with your Primary Care Provider. ? ?If you are age 16 or younger, your body mass index should be between 19-25. Your Body mass index is 24.13 kg/m?Marland Kitchen If this is out of the aformentioned range listed, please consider follow up with your Primary Care Provider.  ? ?________________________________________________________ ? ?The [facility] providers would like to encourage you to use [facility] to communicate with providers for non-urgent requests or questions.  Due to long hold times on the telephone, sending your provider a message by [facility] may be a faster and more efficient way to get a response.  Please allow 48 business hours for a response.  Please remember that this is for non-urgent requests.  ?_______________________________________________________ ? ?Thank you for choosing me and [facility]. ? ? ? ?

## 2021-12-23 NOTE — Progress Notes (Signed)
? ?Subjective:  ? ? Patient ID: [name], male    DOB: October 01, 1941, 81 y.o.   MRN: [id number] ? 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  He is here today with his wife and daughter. ? ?He reports he is actually feeling quite well of late.  He has had paracentesis about every 2 weeks but the last 2 have had less than 2-1/2 L of ascites removed.  He is not having abdominal pain.  He does feel that his abdomen is slightly distended but not uncomfortable.  He has not had any issues with confusion.  Appetite has been good.  Bowel movements regular.  No further diarrhea.  No nausea or vomiting.  He has been consistent with his medication doses.  No recent falling. ? ?He follows with Dr. Irene Limbo and had an MRI to follow-up the [facility] previously treated about 5 weeks ago. ? ? ?Review of Systems ?As per HPI, otherwise negative ? ?Current Medications, Allergies, Past Medical History, Past Surgical History, Family History and Social History were reviewed in [employer] record. ?   ?Objective:  ? Physical Exam ?BP 92/70 (BP Location: Right Arm, Patient Position: Sitting, Cuff Size: Normal)   Pulse 99   Ht '5\' 11"'$  (1.803 m)   Wt 173 lb (78.5 kg)   SpO2 99%   BMI 24.13 kg/m?  ?Gen: awake, alert, NAD ?HEENT: anicteric ?CV: RRR, 2/6 systolic ejection murmur ?Pulm: CTA b/l ?Abd: soft, mild ascites without significant distention, certainly not tense, nontender, +BS throughout ?Ext: no c/c/e ?Neuro: nonfocal, no asterixis ? ?MRI ABDOMEN WITHOUT AND WITH CONTRAST ?  ?TECHNIQUE: ?Multiplanar multisequence MR imaging of the abdomen was performed ?both before and after the administration of intravenous contrast. ?  ?CONTRAST:  18m GADAVIST GADOBUTROL 1 MMOL/ML IV SOLN ?  ?COMPARISON:  07/01/2021 ?  ?FINDINGS: ?Lower chest: No acute findings. ?   ?Hepatobiliary: Hepatic cirrhosis again demonstrated. Post treatment ?changes again seen in segment 4. A lesion showing thin peripheral ?rim enhancement is again seen in segment 4A of the left hepatic ?lobe, measuring 2.0 x 1.2 cm on image 37/20. This is unchanged since ?previous study. ?  ?A small lesion in segment 3 also shows mild peripheral enhancement. ?This measures 10 x 9 mm on image 43/27, and shows decreased size and ?enhancement compared to previous study when it measured 1.6 x 1.5 ?cm. No new or enlarging liver masses identified. Prior ?cholecystectomy. No evidence of biliary obstruction. ?  ?Pancreas: No mass or inflammatory changes. ?  ?Spleen: Stable mild splenomegaly with length measuring approximately ?14 cm. Stable tiny benign cyst again seen in the anterior aspect of ?the spleen. ?  ?Adrenals/Urinary Tract: No masses identified. Stable small right ?renal cyst. No evidence of hydronephrosis. ?  ?Stomach/Bowel: Diverticulosis involving visualized portion of ?ascending and transverse colon, without evidence of diverticulitis ?in these regions. Otherwise unremarkable. ?  ?Vascular/Lymphatic: No pathologically enlarged lymph nodes ?identified. Left upper quadrant portosystemic venous collaterals ?again seen, consistent with portal venous hypertension. No acute ?vascular findings. ?  ?Other: Moderate ascites, without significant change since prior ?exam. ?  ?Musculoskeletal:  No suspicious bone lesions identified. ?  ?IMPRESSION: ?Stable treated lesion in segment 4A of the left hepatic lobe, ?showing thin peripheral rim enhancement. ?  ?Decreased size and enhancement of previously seen mass in segment 3 ?of the left hepatic lobe. ?  ?  No evidence of new or enlarging liver masses. ?  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 ?  ?Moderate ascites, without significant change. ?  ?  ?Electronically Signed ?  By: Marlaine Hind M.D. ?  On: 11/15/2021 15:13 ? ? ? ?    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  ? ?Decompensated cirrhosis with portal hypertension/ascites with history of SBP/HCC status post ablative therapy and history of small varices --he looks well today and certainly stable over the last 6 weeks.  He has had no evidence of recurrent SBP and he is on Bactrim prophylaxis.  He is tolerating current diuretic doses and his paracenteses have yielded less ascites of late.  We may be able to extend the interval between paracentesis.  I reviewed the following with he and his family today: ?--Ascites: Continue furosemide 40 mg and spironolactone 50 mg daily; CMP today.  Will plan paracentesis every 2 weeks however at paracentesis currently scheduled for next Monday if less than 3 L of ascites were removed we can change paracentesis to every 3 weeks; would continue IV albumin with paracentesis. ?--Continue daily and indefinite Bactrim 1 double strength tablet daily for SBP prophylaxis (patient understands the importance of this med daily) ?--Continue low-sodium diet ?--Small varices: Continue carvedilol; surveillance upper endoscopy not indicated while on beta-blocker unless further decompensation ?--No evidence for encephalopathy ?--HCC: Prior treatment and following with Dr. Irene Limbo; recent surveillance MRI shows stability ?--CBC and INR today as well ? ?8-week follow-up with me or Carl Best ? ?30 minutes total spent today including patient facing time, coordination of care, reviewing medical history/procedures/pertinent radiology studies, and documentation of the encounter. ? ?

## 2021-12-27 ENCOUNTER — Other Ambulatory Visit: Payer: Self-pay

## 2021-12-27 ENCOUNTER — Ambulatory Visit (HOSPITAL_COMMUNITY)
Admission: RE | Admit: 2021-12-27 | Discharge: 2021-12-27 | Disposition: A | Discharge status: Home / Self Care | Payer: Medicare HMO | Source: Ambulatory Visit | Attending: Internal Medicine | Admitting: Internal Medicine

## 2021-12-27 ENCOUNTER — Other Ambulatory Visit (INDEPENDENT_AMBULATORY_CARE_PROVIDER_SITE_OTHER): Payer: Medicare HMO

## 2021-12-27 DIAGNOSIS — K7031 Alcoholic cirrhosis of liver with ascites: Secondary | ICD-10-CM | POA: Diagnosis not present

## 2021-12-27 DIAGNOSIS — R188 Other ascites: Secondary | ICD-10-CM | POA: Diagnosis not present

## 2021-12-27 DIAGNOSIS — C220 Liver cell carcinoma: Secondary | ICD-10-CM | POA: Diagnosis not present

## 2021-12-27 HISTORY — PX: IR PARACENTESIS: IMG2679

## 2021-12-27 LAB — BASIC METABOLIC PANEL
BUN: 45 mg/dL — ABNORMAL HIGH (ref 6–23)
CO2: 20 mEq/L (ref 19–32)
Calcium: 8.7 mg/dL (ref 8.4–10.5)
Chloride: 110 mEq/L (ref 96–112)
Creatinine, Ser: 1.93 mg/dL — ABNORMAL HIGH (ref 0.40–1.50)
GFR: 32.2 mL/min — ABNORMAL LOW (ref >60.00)
Glucose, Bld: 118 mg/dL — ABNORMAL HIGH (ref 70–99)
Potassium: 5.4 mEq/L — ABNORMAL HIGH (ref 3.5–5.1)
Sodium: 138 mEq/L (ref 135–145)

## 2021-12-27 LAB — BODY FLUID CELL COUNT WITH DIFFERENTIAL
Eos, Fluid: 0 %
Lymphs, Fluid: 42 %
Monocyte-Macrophage-Serous Fluid: 52 % (ref 50–90)
Neutrophil Count, Fluid: 6 % (ref 0–25)
Total Nucleated Cell Count, Fluid: 157 cu mm (ref 0–1000)

## 2021-12-27 MED ORDER — LIDOCAINE HCL 1 % IJ SOLN
INTRAMUSCULAR | Status: AC
  Filled 2021-12-27: qty 20

## 2021-12-27 MED ORDER — ALBUMIN HUMAN 25 % IV SOLN
50.0000 g | Freq: Once | INTRAVENOUS | Status: AC
  Administered 2021-12-27: 50 g via INTRAVENOUS
  Filled 2021-12-27: qty 200

## 2021-12-27 MED ORDER — ALBUMIN HUMAN 25 % IV SOLN
INTRAVENOUS | Status: AC
  Filled 2021-12-27: qty 200

## 2021-12-27 MED ORDER — LIDOCAINE HCL 1 % IJ SOLN
INTRAMUSCULAR | Status: DC | PRN
  Administered 2021-12-27: 10 mL

## 2021-12-27 NOTE — Progress Notes (Signed)
?  Subjective:  ?Patient ID: [name], male    DOB: July 10, 1941,  MRN: [id number] ? ?[name] presents to clinic today for at risk foot care with history of diabetic neuropathy, painful thick toenails that are difficult to trim. Pain interferes with ambulation. Aggravating factors include wearing enclosed shoe gear. Pain is relieved with periodic professional debridement.Marland KitchenHe also has h/o ulceration right foot which has healed. Today, it is a preulcerative lesion(s) right foot. ? ?New problem(s): patient has new complaint of possible sore of R 3rd toe. He states digit is tender to touch. Denies any redness, drainage or swelling. Denies any trauma. No fever, chills, night sweats, nausea or vomiting. ? ?PCP is [name], MD , and last visit was November 01, 2021. ? ?No Known Allergies ? ?Review of Systems: Negative except as noted in the HPI. ? ?Objective: No changes noted in today's physical examination. ?General: Patient is a pleasant 82 y.o. Caucasian male obese in NAD. AAO x 3.  ? ?Neurovascular Examination: ?CFT <3 seconds b/l LE. Palpable DP/PT pulses b/l LE. Digital hair absent b/l. Skin temperature gradient WNL b/l. No pain with calf compression b/l. No edema noted b/l. No cyanosis or clubbing noted b/l LE. ? ?Protective sensation diminished with 10g monofilament b/l. ? ?Dermatological:  ?Pedal integument with normal turgor, texture and tone BLE. No open wounds b/l LE. No interdigital macerations noted b/l LE. Toenails 1-5 b/l elongated, discolored, dystrophic, thickened, crumbly with subungual debris and tenderness to dorsal palpation. Preulcerative lesion noted submet head 1 right foot. There is visible subdermal hemorrhage. There is no surrounding erythema, no edema, no drainage, no odor, no fluctuance. ? ?Musculoskeletal:  ?Muscle strength 5/5 to all lower extremity muscle groups bilaterally. Hammertoe deformity noted 1-5 b/l. Has severe contracture of right 3rd digit. No open wound, no  erythema, no edema, no drainage, no fluctuance. Utilizes cane for ambulation assistance. ? ?Hemoglobin A1C Latest Ref Rng & Units 11/03/2021 07/13/2021  ?HGBA1C 4.8 - 5.6 % 5.4 5.2  ?Some recent data might be hidden  ? ?Assessment/Plan: ?1. Pain due to onychomycosis of toenails of both feet   ?2. Pre-ulcerative calluses   ?3. Acquired hammertoes of both feet   ?4. Diabetic peripheral neuropathy associated with type 2 diabetes mellitus ([place])   ?  ?Discussed hammertoe deformity. Monitor. ?-Toenails 1-5 b/l were debrided in length and girth with sterile nail nippers and dremel without iatrogenic bleeding.  ?-Preulcerative lesion pared submet head 1 right foot. Total number pared=1. ?-Patient/POA to call should there be question/concern in the interim.  ? ?Return in about 3 months (around 03/23/2022). ? ?Marzetta Board, DPM  ?

## 2021-12-27 NOTE — Procedures (Signed)
Ultrasound-guided diagnostic and therapeutic paracentesis performed yielding 3 liters of straw colored fluid.  Fluid was sent to lab for analysis. No immediate complications. EBL is none. ? ?

## 2021-12-28 ENCOUNTER — Other Ambulatory Visit: Payer: Self-pay

## 2021-12-28 DIAGNOSIS — K7031 Alcoholic cirrhosis of liver with ascites: Secondary | ICD-10-CM

## 2021-12-28 DIAGNOSIS — E875 Hyperkalemia: Secondary | ICD-10-CM

## 2021-12-28 DIAGNOSIS — I10 Essential (primary) hypertension: Secondary | ICD-10-CM

## 2021-12-28 LAB — GRAM STAIN

## 2021-12-29 LAB — PATHOLOGIST SMEAR REVIEW

## 2021-12-30 ENCOUNTER — Telehealth: Payer: Self-pay | Admitting: Internal Medicine

## 2021-12-30 NOTE — Telephone Encounter (Signed)
This can be done at 2 or 3 weeks depending on what patient feels is necessary ?Given we are having to hold diuretics 2 weeks may be needed  ?He should get IV albumin with each LVP ?He also (especially now without diuretics) should follow strict low Na diet (no more than 2000 mg salt daily) ? ?

## 2021-12-30 NOTE — Telephone Encounter (Signed)
Inbound call from patients wife stating that she has been trying to get a hold of [facility] to get patients fluid drawn off and can not get a hold of anyone. Wife is seeking advice as to what she needs to do to get him scheduled. Please advise.   ?

## 2021-12-30 NOTE — Telephone Encounter (Signed)
Spoke with IR and they are going to have scheduling reach out to he pt to schedule para. Spoke with pt and reviewed with him that since he is not on diuretics to strictly follow low sodium diet. Pt verbalized understanding. ?

## 2021-12-30 NOTE — Telephone Encounter (Signed)
Per IR note pts para on 3/20 had 3 liters of fluid drawn off. Wife is calling wanting to schedule another para. Please advise. Last office note had mentioned perhaps adjusting the schedule. ?

## 2021-12-31 ENCOUNTER — Other Ambulatory Visit: Payer: Self-pay | Admitting: Internal Medicine

## 2021-12-31 ENCOUNTER — Other Ambulatory Visit (HOSPITAL_COMMUNITY): Payer: Self-pay | Admitting: Internal Medicine

## 2021-12-31 ENCOUNTER — Telehealth: Payer: Self-pay | Admitting: Internal Medicine

## 2021-12-31 DIAGNOSIS — K7031 Alcoholic cirrhosis of liver with ascites: Secondary | ICD-10-CM

## 2021-12-31 NOTE — Telephone Encounter (Signed)
Returned patient's daughter call & she is aware to bring in patient for lab work next Tuesday. Advised her that patient could come anytime between 7:30am-5pm during the week. ?

## 2021-12-31 NOTE — Telephone Encounter (Signed)
Patients daughter calling to ask when they need to take the patient to get labs done. ?

## 2022-01-01 LAB — CULTURE, BODY FLUID W GRAM STAIN -BOTTLE: Culture: NO GROWTH

## 2022-01-03 DIAGNOSIS — Z961 Presence of intraocular lens: Secondary | ICD-10-CM | POA: Diagnosis not present

## 2022-01-03 DIAGNOSIS — H18522 Epithelial (juvenile) corneal dystrophy, left eye: Secondary | ICD-10-CM | POA: Diagnosis not present

## 2022-01-03 DIAGNOSIS — Z947 Corneal transplant status: Secondary | ICD-10-CM | POA: Diagnosis not present

## 2022-01-03 DIAGNOSIS — T868419 Corneal transplant failure, unspecified eye: Secondary | ICD-10-CM | POA: Diagnosis not present

## 2022-01-04 ENCOUNTER — Other Ambulatory Visit (INDEPENDENT_AMBULATORY_CARE_PROVIDER_SITE_OTHER): Payer: Medicare HMO

## 2022-01-04 ENCOUNTER — Other Ambulatory Visit: Payer: Self-pay

## 2022-01-04 ENCOUNTER — Telehealth: Payer: Self-pay

## 2022-01-04 ENCOUNTER — Ambulatory Visit (HOSPITAL_COMMUNITY)
Admission: RE | Admit: 2022-01-04 | Discharge: 2022-01-04 | Disposition: A | Discharge status: Home / Self Care | Payer: Medicare HMO | Source: Ambulatory Visit | Attending: Internal Medicine | Admitting: Internal Medicine

## 2022-01-04 DIAGNOSIS — E875 Hyperkalemia: Secondary | ICD-10-CM | POA: Diagnosis not present

## 2022-01-04 DIAGNOSIS — E878 Other disorders of electrolyte and fluid balance, not elsewhere classified: Secondary | ICD-10-CM

## 2022-01-04 DIAGNOSIS — N179 Acute kidney failure, unspecified: Secondary | ICD-10-CM

## 2022-01-04 DIAGNOSIS — C220 Liver cell carcinoma: Secondary | ICD-10-CM | POA: Diagnosis not present

## 2022-01-04 DIAGNOSIS — K7031 Alcoholic cirrhosis of liver with ascites: Secondary | ICD-10-CM

## 2022-01-04 DIAGNOSIS — I10 Essential (primary) hypertension: Secondary | ICD-10-CM | POA: Diagnosis not present

## 2022-01-04 DIAGNOSIS — R188 Other ascites: Secondary | ICD-10-CM | POA: Diagnosis not present

## 2022-01-04 HISTORY — PX: IR PARACENTESIS: IMG2679

## 2022-01-04 LAB — BASIC METABOLIC PANEL
BUN: 67 mg/dL — ABNORMAL HIGH (ref 6–23)
CO2: 20 mEq/L (ref 19–32)
Calcium: 8.5 mg/dL (ref 8.4–10.5)
Chloride: 109 mEq/L (ref 96–112)
Creatinine, Ser: 2.67 mg/dL — ABNORMAL HIGH (ref 0.40–1.50)
GFR: 21.81 mL/min — ABNORMAL LOW (ref >60.00)
Glucose, Bld: 106 mg/dL — ABNORMAL HIGH (ref 70–99)
Potassium: 6.3 mEq/L (ref 3.5–5.1)
Sodium: 134 mEq/L — ABNORMAL LOW (ref 135–145)

## 2022-01-04 LAB — GRAM STAIN

## 2022-01-04 LAB — BODY FLUID CELL COUNT WITH DIFFERENTIAL
Eos, Fluid: 1 %
Lymphs, Fluid: 30 %
Monocyte-Macrophage-Serous Fluid: 64 % (ref 50–90)
Neutrophil Count, Fluid: 5 % (ref 0–25)
Total Nucleated Cell Count, Fluid: 122 cu mm (ref 0–1000)

## 2022-01-04 MED ORDER — SODIUM POLYSTYRENE SULFONATE 15 GM/60ML PO SUSP: 15.0000 g | Freq: Once | ORAL | 0 refills | Status: AC

## 2022-01-04 MED ORDER — ALBUMIN HUMAN 25 % IV SOLN
INTRAVENOUS | Status: AC
  Administered 2022-01-04: 50 g via INTRAVENOUS
  Filled 2022-01-04: qty 200

## 2022-01-04 MED ORDER — LIDOCAINE HCL 1 % IJ SOLN
INTRAMUSCULAR | Status: AC
  Filled 2022-01-04: qty 20

## 2022-01-04 MED ORDER — LIDOCAINE HCL (PF) 1 % IJ SOLN
INTRAMUSCULAR | Status: DC | PRN
  Administered 2022-01-04: 10 mL

## 2022-01-04 MED ORDER — ALBUMIN HUMAN 25 % IV SOLN
50.0000 g | Freq: Once | INTRAVENOUS | Status: AC
Start: 2022-01-04 — End: 2022-01-04
  Filled 2022-01-04: qty 200

## 2022-01-04 NOTE — Procedures (Signed)
PROCEDURE SUMMARY: ? ?Successful ultrasound guided paracentesis from the left lower quadrant.  ?Yielded 3 L of cloudy yellow fluid.  ?No immediate complications.  ?The patient tolerated the procedure well.  ? ?Specimen sent for labs. ? ?EBL < 2 mL ? ?The patient has required >/=2 paracenteses in a 30 day period and a formal evaluation by the [facility] has been arranged. ? ? ?Soyla Dryer, AGACNP-BC ?[phone] ?01/04/2022, 2:55 PM ? ? ? ?

## 2022-01-04 NOTE — Telephone Encounter (Signed)
See lab result notes with plan per Dr. Hilarie Fredrickson and Dr. Yong Channel.  ?

## 2022-01-04 NOTE — Telephone Encounter (Signed)
TC from lab, critical result of Potassium 6.3 ?

## 2022-01-05 ENCOUNTER — Other Ambulatory Visit: Payer: Self-pay

## 2022-01-05 ENCOUNTER — Telehealth: Payer: Self-pay | Admitting: Family Medicine

## 2022-01-05 ENCOUNTER — Other Ambulatory Visit (INDEPENDENT_AMBULATORY_CARE_PROVIDER_SITE_OTHER): Payer: Medicare HMO

## 2022-01-05 DIAGNOSIS — E875 Hyperkalemia: Secondary | ICD-10-CM | POA: Diagnosis not present

## 2022-01-05 DIAGNOSIS — E878 Other disorders of electrolyte and fluid balance, not elsewhere classified: Secondary | ICD-10-CM

## 2022-01-05 LAB — BASIC METABOLIC PANEL
BUN: 69 mg/dL — ABNORMAL HIGH (ref 6–23)
CO2: 21 mEq/L (ref 19–32)
Calcium: 8.4 mg/dL (ref 8.4–10.5)
Chloride: 109 mEq/L (ref 96–112)
Creatinine, Ser: 2.4 mg/dL — ABNORMAL HIGH (ref 0.40–1.50)
GFR: 24.79 mL/min — ABNORMAL LOW (ref >60.00)
Glucose, Bld: 104 mg/dL — ABNORMAL HIGH (ref 70–99)
Potassium: 5.8 mEq/L — ABNORMAL HIGH (ref 3.5–5.1)
Sodium: 135 mEq/L (ref 135–145)

## 2022-01-05 NOTE — Addendum Note (Signed)
Addended by: Hardie Pulley, Lennon Richins J on: 01/05/2022 10:11 AM ? ? Modules accepted: Orders ? ?

## 2022-01-05 NOTE — Telephone Encounter (Signed)
Patient called- took a liquid bottle to lower potassium level. He is off heart medication today- Stated potassium level is back to normal now- stated it is "5 something" should he continue liquid medication and proceed with heart medication. Patient wants a call back today - I explained to him he may receive a call tomorrow as we are at the end of day. I teamed [place].-  ?

## 2022-01-05 NOTE — Progress Notes (Signed)
? ?Phone [phone] ?In person visit ?  ?Subjective:  ? ?[name] is a 81 y.o. year old very pleasant male patient who presents for/with See problem oriented charting ?Chief Complaint  ?Patient presents with  ? Follow-up  ? Hypertension  ? COPD  ? Diabetes  ? Hyperlipidemia  ? Gastroesophageal Reflux  ? Depression  ? ? ?This visit occurred during the SARS-CoV-2 public health emergency.  Safety protocols were in place, including screening questions prior to the visit, additional usage of staff PPE, and extensive cleaning of exam room while observing appropriate contact time as indicated for disinfecting solutions.  ? ?Past Medical History-  ?Patient Active Problem List  ? Diagnosis Date Noted  ? 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  Priority: High  ? 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  Priority: High  ? Chronic systolic heart failure ([place]) 12/05/2019  ?  Priority: High  ? Thrombocytopenia ([place]) 12/21/2015  ?  Priority: High  ? 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  Priority: High  ? ILD (interstitial lung disease) ([place]) 07/05/2012  ?  Priority: High  ? COPD (chronic obstructive pulmonary disease) ([place]) 12/15/2009  ?  Priority: High  ? Chronic back pain. Off narcotics 06/05/17 due to negative UDS for opiates x2. Full history 06/12/14. Pain contract signed.  05/02/2007  ?  Priority: High  ? 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  Priority: High  ? Anxiety 02/25/2020  ?  Priority: Medium   ? B12 deficiency 08/20/2015  ?  Priority: Medium   ? Diabetic polyneuropathy associated with type 2 diabetes mellitus ([place]) 08/07/2015  ?  Priority: Medium   ? Stage 3b chronic kidney disease ([place]) 05/06/2015  ?  Priority: Medium   ? Fatty liver 11/04/2014  ?  Priority: Medium   ? BPH (benign prostatic hyperplasia) 06/20/2007  ?  Priority: Medium   ? Depression 05/02/2007  ?  Priority: Medium   ? Hyperlipidemia associated with type 2 diabetes mellitus  ([place]) 04/06/2007  ?  Priority: Medium   ? Essential hypertension 04/06/2007  ?  Priority: Medium   ? Osteoarthritis 04/06/2007  ?  Priority: Medium   ? Iron deficiency anemia due to chronic blood loss 04/19/2021  ?  Priority: Low  ? Aortic atherosclerosis ([place]) 12/14/2019  ?  Priority: Low  ? GERD (gastroesophageal reflux disease) 09/30/2017  ?  Priority: Low  ? 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 positive 03/29/2016  ?  Priority: Low  ? Diplopia 06/05/2015  ?  Priority: Low  ? Candidal balanoposthitis 06/20/2014  ?  Priority: Low  ? PCO (posterior capsular opacification) 06/19/2013  ?  Priority: Low  ? 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  Priority: Low  ? Pseudophakia of left eye 04/10/2013  ?  Priority: Low  ? Nuclear cataract 01/20/2012  ?  Priority: Low  ? 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  ?  Priority: Low  ? Recurrent major depressive disorder, in full remission ([place]) 11/16/2021  ? Protein-calorie malnutrition, severe 11/05/2021  ? Sepsis ([place]) 11/04/2021  ? Acute lower UTI 11/04/2021  ? Other ascites   ? Sepsis due to Spontaneous bacterial peritonitis ([place]) 11/03/2021  ? Malnutrition of moderate degree 07/14/2021  ? Cirrhosis of liver with ascites ([place])   ? Mucosal abnormality of stomach   ? Portal hypertensive gastropathy (HCC)   ? Secondary esophageal varices without bleeding ([place])   ? Generalized weakness 07/13/2021  ? Chronic diarrhea 07/13/2021  ?  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 Macrocytic anemia 03/05/2018  ? Chronic low back pain 08/30/2007  ? ? ?Medications- reviewed and updated ?Current Outpatient Medications  ?Medication Sig Dispense Refill  ? albuterol (VENTOLIN HFA) 108 (90 Base) MCG/ACT inhaler Inhale 2 puffs into the lungs every 6 (six) hours as needed for wheezing or shortness of breath. 18 g 3  ? busPIRone (BUSPAR) 7.5 MG tablet Take 1 tablet (7.5 mg total) by mouth 2 (two) times daily as needed. for anxiety (Patient taking differently: Take 7.5 mg by mouth See admin  instructions. Take 7.5 mg at bedtime, may take a second 7.5 mg dose as needed for anxiety) 180 tablet 3  ? escitalopram (LEXAPRO) 5 MG tablet Take 1 tablet (5 mg total) by mouth daily. 90 tablet 3  ? finasteride (PROSCAR) 5 MG tablet TAKE 1 TABLET EVERY DAY 90 tablet 1  ? gabapentin (NEURONTIN) 300 MG capsule TAKE 1 CAPSULE TWICE DAILY (Patient taking differently: Take 300 mg by mouth 2 (two) times daily.) 180 capsule 3  ? pantoprazole (PROTONIX) 40 MG tablet Take 1 tablet (40 mg total) by mouth 2 (two) times daily. 180 tablet 3  ? rosuvastatin (CRESTOR) 5 MG tablet TAKE 1 TABLET EVERY DAY 90 tablet 3  ? tamsulosin (FLOMAX) 0.4 MG CAPS capsule TAKE 1 CAPSULE EVERY DAY (Patient taking differently: Take 0.4 mg by mouth daily.) 90 capsule 1  ? carvedilol (COREG) 6.25 MG tablet Take 1 tablet (6.25 mg total) by mouth 2 (two) times daily with a meal. 180 tablet 3  ? oxyCODONE-acetaminophen (PERCOCET) 10-325 MG tablet Take 1 tablet by mouth every 8 (eight) hours as needed for pain (chronic pain in cancer patient with cirrhosis). (Patient taking differently: Take 0.5 tablets by mouth every 8 (eight) hours as needed for pain (chronic pain in cancer patient with cirrhosis).) 90 tablet 0  ? ?No current facility-administered medications for this visit.  ? ?Facility-Administered Medications Ordered in Other Visits  ?Medication Dose Route Frequency Provider Last Rate Last Admin  ? albumin human 25 % solution           ? lidocaine (PF) (XYLOCAINE) 1 % injection    PRN Ardis Rowan, PA-C   10 mL at 02/02/22 1343  ? lidocaine (XYLOCAINE) 1 % (with pres) injection           ? ?  ?Objective:  ?BP 110/60   Pulse (!) 105   Temp 97.9 ?F (36.6 ?C)   Ht '5\' 11"'$  (1.803 m)   SpO2 90%   BMI 25.10 kg/m?  ?Gen: NAD, appears fatigued ?CV: RRR no murmurs rubs or gallops ?Lungs: CTAB no crackles, wheeze, rhonchi ?Abdomen: Protuberant abdomen consistent with ascites-upcoming paracentesis ?Ext: no edema ?Skin: warm, dry ? ?   ? ?Assessment and Plan  ? ?#Palliative approach to pain and patient with chronic low back pain-patient has done much better overall since starting hydrocodone 10-325 mg.  Reports primarily taking half tablet 3 times a day and mainly not needing Tylenol-previously was above ideal goal for his cirrhosis with Tylenol ? ?#Anemia/melena ?S: Patient reported melena at visit with Dr. Irene Limbo.  Fortunately today he reports last episode of this was over a week ago.  Patient did have significant anemia and required 2 units of blood-hemoglobin appears stable on labs today.  There was some concern patient was taking ibuprofen but he assures me that he is only taking primarily hydrocodone and occasionally Tylenol but no ibuprofen.  Patient had declined ER visit ? ?Dr.  Pyrtle was kind enough to assist with the following message noted ?"Agree with ER rec which he refused last week. ?I see he got 2 units pRBCs ?He is seeing Dr. Yong Channel today. ?I have discussed with GI colleagues, [facility] staff and [facility] team. ?  ?Plan: ?1. Restart PPI, pantoprazole 40 mg BID-AC in setting of melena ?2. If ongoing active melena then he should go to ER emergently ?3. EGD Thursday, April 27, 12:30 PM with Dr. Silverio Decamp at [facility] ?4. I asked Dr. Yong Channel to repeat CBC today while at PCP visit" ?A/P: Patient is scheduled for EGD on Thursday-to evaluate for esophageal varices-continue beta-blocker at this time ?-Hold off on aspirin at this time-I will let Dr. Domenic Polite know-strongly suspected CAD and patient having consider catheterization before becoming more ill-no confirmed CAD or stroke history ? ?#Hyperkalemia-patient noted to be hyperkalemic despite being off Entresto and reportedly still taking Lokelma.  I do somewhat question whether patient is really taking these medications-I have encouraged him to bring all medicines to each visit ?  ?#Hepatocellular carcinoma-following with Dr. Irene Limbo ?S: Not a surgical candidate. Patient had undergone radiation  treatment with some decrease in size-palliative approach. ?A/P: Overall stable-continue close follow-up with oncology ? ?#ILD/COPD ?S: Patient carries a diagnosis of COPD due to former smoking. Also previously diagnosed as having

## 2022-01-06 ENCOUNTER — Other Ambulatory Visit: Payer: Self-pay | Admitting: Family Medicine

## 2022-01-06 DIAGNOSIS — I129 Hypertensive chronic kidney disease with stage 1 through stage 4 chronic kidney disease, or unspecified chronic kidney disease: Secondary | ICD-10-CM | POA: Diagnosis not present

## 2022-01-06 DIAGNOSIS — N2581 Secondary hyperparathyroidism of renal origin: Secondary | ICD-10-CM | POA: Diagnosis not present

## 2022-01-06 DIAGNOSIS — D631 Anemia in chronic kidney disease: Secondary | ICD-10-CM | POA: Diagnosis not present

## 2022-01-06 DIAGNOSIS — K7031 Alcoholic cirrhosis of liver with ascites: Secondary | ICD-10-CM | POA: Diagnosis not present

## 2022-01-06 DIAGNOSIS — N184 Chronic kidney disease, stage 4 (severe): Secondary | ICD-10-CM | POA: Diagnosis not present

## 2022-01-06 DIAGNOSIS — N189 Chronic kidney disease, unspecified: Secondary | ICD-10-CM | POA: Diagnosis not present

## 2022-01-06 LAB — COMPREHENSIVE METABOLIC PANEL
Albumin: 3.6 (ref 3.5–5.0)
Calcium: 8.9 (ref 8.7–10.7)

## 2022-01-06 LAB — BASIC METABOLIC PANEL
BUN: 66 — AB (ref 4–21)
CO2: 20 (ref 13–22)
Chloride: 108 (ref 99–108)
Creatinine: 2 — AB (ref 0.6–1.3)
Glucose: 141
Potassium: 5 mEq/L (ref 3.5–5.1)
Sodium: 134 — AB (ref 137–147)

## 2022-01-06 LAB — IRON,TIBC AND FERRITIN PANEL
%SAT: 12
Iron: 31
TIBC: 261
UIBC: 230

## 2022-01-06 LAB — VITAMIN D 25 HYDROXY (VIT D DEFICIENCY, FRACTURES): Vit D, 25-Hydroxy: 28.1

## 2022-01-06 LAB — PATHOLOGIST SMEAR REVIEW

## 2022-01-06 NOTE — Telephone Encounter (Signed)
[name] called and spoke with pt and documented under pt lab results.  ?

## 2022-01-07 ENCOUNTER — Telehealth: Payer: Self-pay

## 2022-01-07 NOTE — Telephone Encounter (Signed)
-----   Message from [name], MD sent at 01/06/2022  5:13 PM EDT ----- ?Team can we possibly get him in on the 3rd or 4th to discuss pain management- Dr. Posey Pronto told me he was hurting ?----- Message ----- ?From: [name], MD ?Sent: 01/06/2022   3:04 PM EDT ?To: [name], MD ? ?Visit/pain meds ? ? ?

## 2022-01-07 NOTE — Telephone Encounter (Signed)
Pt has been scheduled.  °

## 2022-01-09 ENCOUNTER — Other Ambulatory Visit: Payer: Self-pay | Admitting: Family Medicine

## 2022-01-09 LAB — CULTURE, BODY FLUID W GRAM STAIN -BOTTLE: Culture: NO GROWTH

## 2022-01-10 ENCOUNTER — Other Ambulatory Visit: Payer: Self-pay | Admitting: Internal Medicine

## 2022-01-10 ENCOUNTER — Other Ambulatory Visit (HOSPITAL_COMMUNITY): Payer: Self-pay | Admitting: Internal Medicine

## 2022-01-10 DIAGNOSIS — K7031 Alcoholic cirrhosis of liver with ascites: Secondary | ICD-10-CM

## 2022-01-11 ENCOUNTER — Encounter: Payer: Self-pay | Admitting: Family Medicine

## 2022-01-11 ENCOUNTER — Ambulatory Visit (INDEPENDENT_AMBULATORY_CARE_PROVIDER_SITE_OTHER): Payer: Medicare HMO | Admitting: Family Medicine

## 2022-01-11 VITALS — BP 100/50 | HR 58 | Temp 98.8°F | Ht 71.0 in | Wt 177.0 lb

## 2022-01-11 DIAGNOSIS — C220 Liver cell carcinoma: Secondary | ICD-10-CM | POA: Diagnosis not present

## 2022-01-11 DIAGNOSIS — E875 Hyperkalemia: Secondary | ICD-10-CM

## 2022-01-11 DIAGNOSIS — G8929 Other chronic pain: Secondary | ICD-10-CM

## 2022-01-11 DIAGNOSIS — I5022 Chronic systolic (congestive) heart failure: Secondary | ICD-10-CM

## 2022-01-11 DIAGNOSIS — M5442 Lumbago with sciatica, left side: Secondary | ICD-10-CM | POA: Diagnosis not present

## 2022-01-11 DIAGNOSIS — K7030 Alcoholic cirrhosis of liver without ascites: Secondary | ICD-10-CM

## 2022-01-11 MED ORDER — OXYCODONE-ACETAMINOPHEN 10-325 MG PO TABS
1.0000 | ORAL_TABLET | Freq: Three times a day (TID) | ORAL | 0 refills | Status: DC | PRN
Start: 2022-01-11 — End: 2022-02-01

## 2022-01-11 NOTE — Progress Notes (Addendum)
?Phone [phone] ?In person visit ?  ?Subjective:  ? ?[name] is a 81 y.o. year old very pleasant male patient who presents for/with See problem oriented charting ?Chief Complaint  ?Patient presents with  ? Pain Management  ?  Pt c/o pain every where. Pt feels he has too many doctors and none are on the same page, too many different meds and gets confused on what's  what and how they should be taken. Wants all his docs to collaborate and get on the same page.  ? Cough  ?  Pt states he thinks he has walking pneumonia due to getting winded while walking.  ? ?This visit occurred during the SARS-CoV-2 public health emergency.  Safety protocols were in place, including screening questions prior to the visit, additional usage of staff PPE, and extensive cleaning of exam room while observing appropriate contact time as indicated for disinfecting solutions.  ? ?Past Medical History-  ?Patient Active Problem List  ? Diagnosis Date Noted  ? Hepatocellular carcinoma ([place]) 06/01/2020  ?  Priority: High  ? 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[place]) 12/14/2019  ?  Priority: High  ? Chronic systolic heart failure ([place]) 12/05/2019  ?  Priority: High  ? Thrombocytopenia ([place]) 12/21/2015  ?  Priority: High  ? 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  Priority: High  ? ILD (interstitial lung disease) ([place]) 07/05/2012  ?  Priority: High  ? COPD (chronic obstructive pulmonary disease) ([place]) 12/15/2009  ?  Priority: High  ? Chronic back pain. Off narcotics 06/05/17 due to negative UDS for opiates x2. Full history 06/12/14. Pain contract signed.  05/02/2007  ?  Priority: High  ? DM (diabetes mellitus) type II controlled, neurological manifestation ([place]) 04/06/2007  ?  Priority: High  ? Anxiety 02/25/2020  ?  Priority: Medium   ? B12 deficiency 08/20/2015  ?  Priority: Medium   ? Diabetic polyneuropathy associated with type 2 diabetes mellitus ([place]) 08/07/2015  ?  Priority: Medium   ? CKD (chronic kidney disease), stage III ([place])  05/06/2015  ?  Priority: Medium   ? Fatty liver 11/04/2014  ?  Priority: Medium   ? BPH (benign prostatic hyperplasia) 06/20/2007  ?  Priority: Medium   ? Depression 05/02/2007  ?  Priority: Medium   ? Hyperlipidemia associated with type 2 diabetes mellitus ([place]) 04/06/2007  ?  Priority: Medium   ? Essential hypertension 04/06/2007  ?  Priority: Medium   ? Osteoarthritis 04/06/2007  ?  Priority: Medium   ? Iron deficiency anemia due to chronic blood loss 04/19/2021  ?  Priority: Low  ? Aortic atherosclerosis ([place]) 12/14/2019  ?  Priority: Low  ? GERD (gastroesophageal reflux disease) 09/30/2017  ?  Priority: Low  ? 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 positive 03/29/2016  ?  Priority: Low  ? Diplopia 06/05/2015  ?  Priority: Low  ? Candidal balanoposthitis 06/20/2014  ?  Priority: Low  ? PCO (posterior capsular opacification) 06/19/2013  ?  Priority: Low  ? 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  Priority: Low  ? Pseudophakia of left eye 04/10/2013  ?  Priority: Low  ? Nuclear cataract 01/20/2012  ?  Priority: Low  ? 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  ?  Priority: Low  ? Recurrent major depressive disorder, in full remission ([place]) 11/16/2021  ? Protein-calorie malnutrition, severe 11/05/2021  ? Sepsis ([place]) 11/04/2021  ? Acute lower UTI 11/04/2021  ? Other ascites   ? Sepsis due to Spontaneous bacterial peritonitis (  [place]) 11/03/2021  ? Malnutrition of moderate degree 07/14/2021  ? Cirrhosis of liver with ascites ([place])   ? Mucosal abnormality of stomach   ? Portal hypertensive gastropathy (HCC)   ? Secondary esophageal varices without bleeding ([place])   ? Generalized weakness 07/13/2021  ? Chronic diarrhea 07/13/2021  ? 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 Macrocytic anemia 03/05/2018  ? ? ?Medications- reviewed and updated ?Current Outpatient Medications  ?Medication Sig Dispense Refill  ? albuterol (VENTOLIN HFA) 108 (90 Base) MCG/ACT inhaler Inhale 2 puffs into the lungs every 6 (six) hours as needed for  wheezing or shortness of breath. 18 g 3  ? aspirin EC 81 MG tablet Take 81 mg by mouth daily.    ? busPIRone (BUSPAR) 7.5 MG tablet Take 1 tablet (7.5 mg total) by mouth 2 (two) times daily as needed. for anxiety (Patient taking differently: Take 7.5 mg by mouth at bedtime.) 180 tablet 3  ? carvedilol (COREG) 6.25 MG tablet Take 1 tablet (6.25 mg total) by mouth 2 (two) times daily with a meal. 180 tablet 1  ? escitalopram (LEXAPRO) 5 MG tablet Take 1 tablet (5 mg total) by mouth daily. 90 tablet 3  ? famotidine (PEPCID) 20 MG tablet TAKE 1 TABLET TWICE DAILY 180 tablet 3  ? finasteride (PROSCAR) 5 MG tablet TAKE 1 TABLET EVERY DAY 90 tablet 1  ? gabapentin (NEURONTIN) 300 MG capsule TAKE 1 CAPSULE TWICE DAILY (Patient taking differently: Take 300 mg by mouth 2 (two) times daily.) 180 capsule 3  ? Multiple Vitamin (MULTIVITAMIN WITH MINERALS) TABS tablet Take 1 tablet by mouth daily. 30 tablet 0  ? oxyCODONE-acetaminophen (PERCOCET) 10-325 MG tablet Take 1 tablet by mouth every 8 (eight) hours as needed for up to 5 days for pain (chronic pain in cancer patient with cirrhosis). 90 tablet 0  ? rosuvastatin (CRESTOR) 5 MG tablet TAKE 1 TABLET EVERY DAY 90 tablet 3  ? sacubitril-valsartan (ENTRESTO) 24-26 MG Take 1 tablet by mouth 2 (two) times daily. 180 tablet 3  ? tamsulosin (FLOMAX) 0.4 MG CAPS capsule TAKE 1 CAPSULE EVERY DAY (Patient taking differently: Take 0.4 mg by mouth daily.) 90 capsule 1  ? ?No current facility-administered medications for this visit.  ? ?  ?Objective:  ?BP (!) 100/50   Pulse (!) 58   Temp 98.8 ?F (37.1 ?C)   Ht '5\' 11"'$  (1.803 m)   Wt 177 lb (80.3 kg)   SpO2 94%   BMI 24.69 kg/m?  ?Gen: NAD, resting comfortably ?CV: Slightly bradycardic.  Stable murmur ?Lungs: CTAB no crackles, wheeze, rhonchi ?Abdomen: Distended abdomen-very tender at epigastric area ?Ext: Trace edema ?Skin: warm, dry ?  ? ?Assessment and Plan  ? ?#Clarifying medications-family will Call back with strength of  ciprofloxacin and lokelma ? ?#Decompensated cirrhosis with portal hypertension/ascites ?#CHF ?#Hyperkalemia ? ?S: Patient seem to be doing well at visit on 12/23/2021 with GI-at that time plan was paracentesis every 2 weeks and possibly with plan to space to every 3 weeks while he was taking furosemide and spironolactone.  He was on Bactrim for spontaneous bacterial peritonitis prophylaxis ? ?Unfortunately renal function worsened on this regimen and he developed hyperkalemia.  Diuretics were stopped.  Short-term we held [place].  Urgent referral to nephrology was placed by Dr. Hilarie Fredrickson.  I treated him with Kayexalate short-term for 2 doses and potassium was trending down to 5.8. ? ?He eventually saw Dr. Posey Pronto and Milly Jakob was started.  Bactrim due to risk for hyperkalemia was changed  to ciprofloxacin though I do not have the specific dose available today.  Apparently potassium came down to 5 and GFR increased back over 30 from in the 20s.  Patient reports he did later restart Entresto in the last week-cardiology has wanted [place] to continue this if we are able ? ?With these changes patient has noted increasing weight and abdominal distention/ascites-he gives a rather significant discomfort in upper abdomen and feels somewhat winded typically before he needs a paracentesis and he is due tomorrow for this and reports to me he is on a weekly schedule now ?A/P: Ascites seems to be worsening-I am thankful he has scheduled paracentesis tomorrow.  Has some worsening shortness of breath but lungs are clear-want to see how his BMET looks and how he does after paracentesis but he may need to possibly restart Lasix-would likely reach out to Dr. Posey Pronto about this first to see if he thinks he can tolerate this ?-We did a referral to palliative care in light of his chronic conditions and we discussed even possibly could qualify for hospice if he does not see restabilization of some of chronic conditions ?-He did seem to do reasonably  well with SBRT for 2 lesions related to hepatocellular carcinoma-most recent imaging to 423 without worsening imagings November 13, 2021 without worsening findings and plan was for 10-week follow-up at time of February visit  ?-Co

## 2022-01-11 NOTE — Patient Instructions (Addendum)
Do not take additional tylenol on top of the hydrocodone-acetaminophen 10-325. You can either take this every 8 hours or half tablet every 4 hours (maybe start with every 4 hours). If you feel over sedated please stop. Any falls we will need to stop.  ? ?If symptoms worsen overnight seek care in emergency room but hopefully you can get to your paracentesis tomorrow.  ? ?Call back with strength of ciprofloxacin and lokelma ? ?I will refer to palliative care ? ?Team please write note to extend FMLA for daughter ? ?Please stop by lab before you go ?If you have mychart- we will send your results within 3 business days of [place] receiving them.  ?If you do not have mychart- we will call you about results within 5 business days of [place] receiving them.  ?*please also note that you will see labs on mychart as soon as they post. I will later go in and write notes on them- will say "notes from Dr. Yong Channel"  ? ?Recommended follow up: Return in about 1 month (around 02/10/2022) for followup or sooner if needed.Schedule b4 you leave.  ?

## 2022-01-12 ENCOUNTER — Telehealth: Payer: Self-pay

## 2022-01-12 ENCOUNTER — Ambulatory Visit (HOSPITAL_COMMUNITY)
Admission: RE | Admit: 2022-01-12 | Discharge: 2022-01-12 | Disposition: A | Discharge status: Home / Self Care | Payer: Medicare HMO | Source: Ambulatory Visit | Attending: Internal Medicine | Admitting: Internal Medicine

## 2022-01-12 DIAGNOSIS — K7031 Alcoholic cirrhosis of liver with ascites: Secondary | ICD-10-CM | POA: Insufficient documentation

## 2022-01-12 DIAGNOSIS — R188 Other ascites: Secondary | ICD-10-CM | POA: Diagnosis not present

## 2022-01-12 HISTORY — PX: IR PARACENTESIS: IMG2679

## 2022-01-12 LAB — BODY FLUID CELL COUNT WITH DIFFERENTIAL
Eos, Fluid: 0 %
Lymphs, Fluid: 42 %
Monocyte-Macrophage-Serous Fluid: 54 % (ref 50–90)
Neutrophil Count, Fluid: 4 % (ref 0–25)
Total Nucleated Cell Count, Fluid: 147 cu mm (ref 0–1000)

## 2022-01-12 LAB — BASIC METABOLIC PANEL
BUN: 54 mg/dL — ABNORMAL HIGH (ref 6–23)
CO2: 22 mEq/L (ref 19–32)
Calcium: 8.8 mg/dL (ref 8.4–10.5)
Chloride: 111 mEq/L (ref 96–112)
Creatinine, Ser: 1.53 mg/dL — ABNORMAL HIGH (ref 0.40–1.50)
GFR: 42.54 mL/min — ABNORMAL LOW (ref >60.00)
Glucose, Bld: 124 mg/dL — ABNORMAL HIGH (ref 70–99)
Potassium: 5.6 mEq/L — ABNORMAL HIGH (ref 3.5–5.1)
Sodium: 139 mEq/L (ref 135–145)

## 2022-01-12 MED ORDER — LIDOCAINE HCL 1 % IJ SOLN
INTRAMUSCULAR | Status: AC
  Filled 2022-01-12: qty 20

## 2022-01-12 MED ORDER — LIDOCAINE HCL (PF) 1 % IJ SOLN
INTRAMUSCULAR | Status: DC | PRN
  Administered 2022-01-12: 5 mL

## 2022-01-12 NOTE — Procedures (Signed)
PROCEDURE SUMMARY: ? ?Successful US guided paracentesis from RLQ.  ?Yielded 1.7L of yellow ascitic fluid.  ?BP noted to be 76/45.  Procedure terminated.  ?Pt endorses not having anything to eat or drink today.  Food and drink given.  Repeat BP 93/64 and patient reported feeling improved, though didn't realize he had felt poorly prior.  Pt discharged with family and return to hospital precautions. ? ?Specimen was sent for labs. ? ?EBL negligible ? ?Tomer Chalmers PA-C ?01/12/2022 ?3:22 PM ? ? ? ?

## 2022-01-12 NOTE — Telephone Encounter (Signed)
Spoke with patient's daughter [name] and scheduled a Mychart Palliative Consult for 01/14/22 @ 2PM.  ? ?Consent obtained; updated Netsmart, Team List and Epic.  ? ? ?

## 2022-01-13 ENCOUNTER — Other Ambulatory Visit: Payer: Self-pay

## 2022-01-13 DIAGNOSIS — E875 Hyperkalemia: Secondary | ICD-10-CM

## 2022-01-13 LAB — PATHOLOGIST SMEAR REVIEW

## 2022-01-14 ENCOUNTER — Encounter: Payer: Self-pay | Admitting: Family Medicine

## 2022-01-14 ENCOUNTER — Telehealth: Payer: Self-pay | Admitting: Family Medicine

## 2022-01-14 DIAGNOSIS — K7031 Alcoholic cirrhosis of liver with ascites: Secondary | ICD-10-CM | POA: Diagnosis not present

## 2022-01-14 DIAGNOSIS — G8929 Other chronic pain: Secondary | ICD-10-CM | POA: Diagnosis not present

## 2022-01-14 DIAGNOSIS — C220 Liver cell carcinoma: Secondary | ICD-10-CM

## 2022-01-14 DIAGNOSIS — R0602 Shortness of breath: Secondary | ICD-10-CM | POA: Diagnosis not present

## 2022-01-14 DIAGNOSIS — M545 Low back pain, unspecified: Secondary | ICD-10-CM | POA: Diagnosis not present

## 2022-01-14 DIAGNOSIS — N1832 Chronic kidney disease, stage 3b: Secondary | ICD-10-CM | POA: Diagnosis not present

## 2022-01-14 NOTE — Progress Notes (Signed)
? ? ?Manufacturing engineer ?Community Palliative Care Consult Note ?Telephone: [phone]  ?Fax: [phone]  ? ?Date of encounter: 01/14/22 ?2:15 pm ?PATIENT NAME: [name] ?[name] [address]   ?[phone] (home)  ?DOB: 1941-02-26 ?MRN: [id number] ?PRIMARY CARE PROVIDER:    ?Joshua Olp, MD,  ?[place] [address] ?[phone] ? ?REFERRING PROVIDER:   ?Joshua Olp, MD ?[address] ?[place],  [place] [address] ?[phone] ? ?RESPONSIBLE PARTY:    ?Contact Information   ? ? Name Relation Home Work Mobile  ? [facility] Daughter   [phone]  ? [name], [name] Spouse [phone]  [phone]  ? ?  ? ?I connected with  [name] on 01/14/22 by a video enabled telemedicine application and verified that I am speaking with the correct person using two identifiers. ?  ?I discussed the limitations of evaluation and management by telemedicine. The patient expressed understanding and agreed to proceed.  ? ? Palliative Care was asked to follow this patient by consultation request of  Joshua Olp, MD to address advance care planning and complex medical decision making. This is the initial visit.  ? ? ?      ASSESSMENT, SYMPTOM MANAGEMENT AND PLAN / RECOMMENDATIONS: ? Hepatocellular Carcinoma ?Recommend continued follow up with Oncologist. ?Has MOST in place. ? ? Alcoholic cirrhosis of liver with ascites ?Decompensated with ascites and Q 2 weeks paracentesis. ?Continue SBP prophylaxis with Cipro ?Monitor for bleeding with thrombocytopenia/esophageal varices ?Monitor mental status for acute decompensation ?Do not take in > 2 gm Tylenol in any form per day ?Continue beta blocker for decreasing portal hypertension and maintenance of heart failure. ? ? Shortness of breath ?Likely multifactorial related to ILD/COPD, chronic respiratory failure, anemia and ascites. ?Continue elevating HOB to improve breathing ?OxyCodone should help some with PND  sensation. ?Unclear how much Oxygen pt taking. ?Recommend iron with stool softener ? ? Chronic low back pain ?Continue OxyCodone 10/325 mg Q 8 hrs prn pain ?Use heat or ice, position change and possible capsaicin or tiger balm to ease pain. ? ? Stage 3b CKD ?Periodic monitoring of BMP for K+, would recommend draw of pediatric tubes with paracentesis to recheck K+ ?Eat diet low in potassium. ?Follow up with nephrology given worsening renal function and anemia ? ? ? ? ? ?Follow up Palliative Care Visit: Palliative care will continue to follow for complex medical decision making, advance care planning, and clarification of goals. Return 01/18/22 for face to face visit. ? ? ?This visit was coded based on medical decision making (MDM). ? ?PPS: 50% ? ?HOSPICE ELIGIBILITY/DIAGNOSIS: TBD ? ?Chief Complaint:  ?[facility] received a referral to follow up with patient for chronic disease management including heart and respiratory failure with liver cancer.  Palliative is also following for advance directive planning and defining/refining goals of care.  ? ?HISTORY OF PRESENT ILLNESS:  [name] is a 81 y.o. year old male with alcoholic cirrhosis and hepatocellular carcinoma with hx of HCV, esophageal varices, hypertension, chronic systolic heart failure, interstitial lung disease and COPD, portal hypertensive gastropathy, type 2 diabetes, vitamin B12 deficiency, macrocytic anemia and thrombocytopenia, hyperkalemia and hyperphosphatemia. ?Pt states he has had liver cancer, is status post radiation twice now without metastasis.  He is currently scheduled for paracenteses every 2 weeks done for ascites and symptom management.  He continues to drive, bathe and dress himself. Denies recent falls, states appetite is good.  This year he has also had  a UTI with E. coli and Pseudomonas aeruginosa as well as spontaneous bacterial peritonitis.  He reports taking high doses of Tylenol including 3 in the  evening, 2 in the morning and 2 midday, will exceeding the 2 g daily limitation.  He was on Bactrim for spontaneous bacterial peritonitis prophylaxis but had a significant decline in kidney function and was changed to ciprofloxacin.  He has scheduled follow-up with nephrology and currently states he has too many specialists which are not sharing communication so he is confused about what to do.  He was started on oxycodone 10/325 mg for pain relief and advised not to take any additional Tylenol during the day.  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  He has had a bone marrow biopsy in the past which was nonspecific for possible myelodysplastic syndrome but has noted iron deficiency anemia and thrombocytopenia which oncology has indicated is likely secondary to fatty liver disease and hepatocellular carcinoma.  He recently had to start on Lokelma to manage high K of 6.3 with temporary cessation of Entresto and diuretics. ? ?History obtained from review of EMR, discussion with  family and/or [name].  ?I reviewed available labs, medications, imaging, studies and related documents from the EMR.  Records reviewed and summarized above.  ? ?ROS ?General: NAD ?EYES: denies vision changes ?ENMT: denies dysphagia ?Cardiovascular: denies chest pain, endorses some DOE, PND when laying down before raising HOB ?Pulmonary: denies cough, denies increased SOB ?Abdomen: endorses good appetite, denies constipation, endorses continence of bowel ?GU: denies dysuria, endorses continence of urine ?MSK:  denies increased weakness, no falls reported ?Skin: denies rashes or wounds ?Neurological: endorses low back pain. denies insomnia ?Psych: Endorses positive mood ?Heme/lymph/immuno: denies bruises, abnormal bleeding ? ?Physical Exam: limited to video observation only ?Current and past weights: 181 lbs 11.2 ounces as  of 10/13/21, 177 lbs as of 01/11/22 ?Constitutional: NAD ?General: chronically ill appearing ?EYES: anicteric sclera, lids intact, right eye more pronounced with some mild ptosis of upper eyelid on right ?ENMT: intact hearing, Missing dentition and what is remaining is in varying stages of repair ?CV: able to speak in complete sentences without having to stop to breathe ?Pulmonary: no cough or audible wheezing and has mild dyspnea ?Abdomen:appears distended ?GU: deferred ?Skin: warm and dry, no rashes or wounds on visible skin ?Neuro: no noted short term memory impairment ?Psych: non-anxious affect, A and O x 3 ?Hem/lymph/immuno: no significantly evident bruising ? ?CURRENT PROBLEM LIST:  ?Patient Active Problem List  ? Diagnosis Date Noted  ? Recurrent major depressive disorder, in full remission ([place]) 11/16/2021  ? Protein-calorie malnutrition, severe 11/05/2021  ? Sepsis ([place]) 11/04/2021  ? Acute lower UTI 11/04/2021  ? Other ascites   ? Sepsis due to Spontaneous bacterial peritonitis ([place]) 11/03/2021  ? Malnutrition of moderate degree 07/14/2021  ? Cirrhosis of liver with ascites ([place])   ? Mucosal abnormality of stomach   ? Portal hypertensive gastropathy ([facility])   ? Secondary esophageal varices without bleeding ([place])   ? Generalized weakness 07/13/2021  ? Chronic diarrhea 07/13/2021  ? 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 Iron deficiency anemia due to chronic blood loss 04/19/2021  ? Hepatocellular carcinoma ([place]) 06/01/2020  ? Anxiety 02/25/2020  ? 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[place]) 12/14/2019  ? Aortic atherosclerosis ([place]) 12/14/2019  ? Chronic systolic heart failure ([place]) 12/05/2019  ?  Macrocytic anemia 03/05/2018  ? GERD (gastroesophageal reflux disease) 09/30/2017  ? 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 positive 03/29/2016  ? Thrombocytopenia ([place]) 12/21/2015  ? B12 deficiency 08/20/2015  ? 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 Diabetic polyneuropathy associated with type 2 diabetes mellitus ([place]) 08/07/2015   ? Diplopia 06/05/2015  ? CKD (chronic kidney disease), stage III ([place]) 05/06/2015  ? Fatty liver 11/04/2014  ? Candidal balanoposthitis 06/20/2014  ? PCO (posterior capsular opacification) 06/19/2013  ?

## 2022-01-15 LAB — BODY FLUID CULTURE W GRAM STAIN: Culture: NO GROWTH

## 2022-01-17 ENCOUNTER — Telehealth: Payer: Self-pay | Admitting: Family Medicine

## 2022-01-17 NOTE — Telephone Encounter (Signed)
Called and spoke with pt, labs reviewed again and pt states he will have to cb to schedule lab visit. ?

## 2022-01-17 NOTE — Telephone Encounter (Signed)
Pt would like a call regarding his labs and a call that was made to his daughter. Please call back ?

## 2022-01-18 ENCOUNTER — Encounter: Payer: Self-pay | Admitting: Family Medicine

## 2022-01-18 ENCOUNTER — Other Ambulatory Visit: Payer: Self-pay | Admitting: Family Medicine

## 2022-01-18 VITALS — BP 92/64 | HR 84 | Resp 20

## 2022-01-18 DIAGNOSIS — N1832 Chronic kidney disease, stage 3b: Secondary | ICD-10-CM

## 2022-01-18 DIAGNOSIS — C220 Liver cell carcinoma: Secondary | ICD-10-CM

## 2022-01-18 DIAGNOSIS — I5022 Chronic systolic (congestive) heart failure: Secondary | ICD-10-CM

## 2022-01-18 DIAGNOSIS — E43 Unspecified severe protein-calorie malnutrition: Secondary | ICD-10-CM

## 2022-01-18 NOTE — Progress Notes (Signed)
? ? ?Manufacturing engineer ?Community Palliative Care Consult Note ?Telephone: [phone]  ?Fax: [phone]  ? ? ?Date of encounter: 01/18/22 ?2:24 PM ?PATIENT NAME: [name] ?[place] [address]   ?[phone] (home)  ?DOB: [birth year] ?MRN: [id number] ?PRIMARY CARE PROVIDER:    ?[name], MD,  ?[place] [address] ?[phone] ? ?REFERRING PROVIDER:   ?[name], MD ?[address] ?[place],  [place] [address] ?[phone] ? ?RESPONSIBLE PARTY:    ?Contact Information   ? ? Name Relation Home Work Mobile  ? [facility] Daughter   [phone]  ? Nazario, Russom Spouse [phone]  [phone]  ? ?  ? ? ? ?I met face to face with patient and family in his home. Palliative Care was asked to follow this patient by consultation request of  [name], MD to address advance care planning and complex medical decision making. This is a follow up visit. ? ? ? ASSESSMENT, SYMPTOM MANAGEMENT AND PLAN / RECOMMENDATIONS:  ?Chronic Systolic Heart Failure ?EF normalized on current therapy of Carvedilol and Entresto but unable to use diuretics with low normal BP at baseline.   ?[name] currently not an option due to poor renal function. ?Monitor daily weight, limit intake to 1.5 L/day and 2 gm Sodium restriction per day. ?SW referral to see if pt qualifies for any low income subsidy or med assistance programs for [employer]. ? ?Stage 3b CKD ?Cr 01/11/22 1.53 with eGFR 42.54 improved from 2.4-2.6 with eGFR 21-24. ?Hyperkalemia improved from 5.8 to 5.4 (at nephrology office) ?Continue to avoid nephrotoxic substances and push fluids to limit of 1.5 L/day. ? ?Hepatocellular carcinoma with ascites ?Due for paracentesis tomorrow and was hypotensive at past treatment necessitating early termination.  Will continue ?Encourage improved intake after paracentesis with good appetite. ?LFTs normal ?Scans without progression s/p XRT. ? ?Severe Protein calorie  malnutrition ?Encourage improved protein intake following paracentesis ?Supplement with Ensure, Boost or Glucerna BID. ? ? ?Advance Care Planning/Goals of Care: Goals include to maximize quality of life and symptom management. Patient gave his permission to discuss. ?Our advance care planning conversation included a discussion about:    ?The value and importance of advance care planning-wife is decision maker ?Experiences with loved ones who have been seriously ill or have died-father-in-law died after 4 years of having a feeding tube and he does not want that ?Exploration of personal, cultural or spiritual beliefs that might influence medical decisions-doesn't want to be long term on a ventilator but if it was temporary he might consider it.  Did not want to consider a face mask ?Exploration of goals of care in the event of a sudden injury or illness -does not want CPR ?Identification of a healthcare agent  ?Reviewed MOST and he needs time consider options ? ?CODE STATUS: ?Has current MOST completed 11/06/21 stating: ?DNR/DNI/limited additional medical interventions ?Antibiotics and IV fluids on case by case time limited basis ? ? ? ?Follow up Palliative Care Visit: Palliative care will continue to follow for complex medical decision making, advance care planning, and clarification of goals. Return 4 weeks or prn. ? ? ? ?This visit was coded based on medical decision making (MDM). ? ?PPS: 50% ? ?HOSPICE ELIGIBILITY/DIAGNOSIS: TBD ? ?Chief Complaint:  ?Palliative Care is following to address chronic medical management of multiple comorbid conditions including cardiorenal syndrome with heart failure rEF and cirrhosis and to assist with advance care planning/goals of care. ? ?HISTORY OF PRESENT ILLNESS:  [name]  is a 81 y.o. year old male with chronic systolic heart failure rEF, CKD stage 2, Hepatocellular carcinoma likely secondary to cirrhosis with esophageal varices and secondary decompensation with ascites  requiring paracentesis Q 2 weeks, hx of Alcoholic cirrhosis and portal hypertensive gastropathy, DM which appears to be diet controlled, neuropathy, HLD, BPH, thrombocytopenia, ocular herpes, chronic back pain, HCV AB +, macrocytic anemia, hx of sepsis secondary to spontaneous bacterial peritonitis in January 2023, COPD, ILD and severe protein calorie malnutrition. Last HGB a1C was 5.4% in January 2023, Albumin improved from 2.3 on 11/02/21 to 3.7 on 12/23/21 and in that same time frame AST has normalized from 47 to 22. HCV  AB reactive 03/22/16 and has subsequently been negative as of 02/13/2018. INR elevated to 1.3 on 11/03/21 was most recently normal at 1.0 on 12/23/21.  Baseline Cr is at about 1.0 with eGFR mid 60s, most recently 12/23/21 he had an acute on chronic kidney injury with Cr elevated at 2.45, BUN 51 and eGFR down to 24.19 with elevated K+ 5.4.  Pt states he was put on Entresto BID because he was told it would help his heart and protect his kidneys but then he states the Nephrologist told him to stop taking the [facility] as this was causing his hyperkalemia and worsening renal function.  He states "I just wish all these doctors were talking to each other so I know what to do and how to take my medicines.  I asked the doctor to prescribe me something different because one of the costs of my medicines was too high but he said he couldn't.  I don't want to die because I can't afford the medicine."  Advised pt that things like Entresto were research proven to help heart function and prevent or minimize hospitalizations/exacerbations but advised that there are some side effects that may make it unable to be used and that the medicines that would help his heart pump better and stronger were either likely contraindicated because his BP was too low at baseline or he was already taking it and had the maximum benefit with a need for more.  His daughter said his EF went from 15% to 45% on Entresto. ECHO 12/04/19 showed EF  20-25% with severe LV wall motion abnormality and mild aortic stenosis.  Repeat echo on 07/07/20 showed an almost normal EF 50-55% which he has maintained and most recently seen on Echo 10/06/21 with EF 55% with moderate aortic stenosis. ? ?History obtained from review of EMR, discussion with family, and/or Mr. Mackowski.  ?I reviewed available labs, medications, imaging, studies and related documents from the EMR.  Records reviewed and summarized above.  ? ?ROS ?General: NAD ?EYES: denies vision changes ?ENMT: denies dysphagia ?Cardiovascular: endorses chest pain when laying down at night that improves when HOB up, endorses DOE, PND and orthopnea ?Pulmonary: denies cough, denies increased SOB ?Abdomen: endorses variable appetite-improved following paracentesis, denies constipation, endorses continence of bowel ?GU: denies dysuria, endorses continence of urine ?MSK:  denies increased weakness, multiple falls reported where he just gets weak ?Skin: denies rashes or wounds ?Neurological: endorses pain that is improved on pain meds which also allow him to sleep ?Psych: Endorses positive mood ?Heme/lymph/immuno: endorses easy bruising, denies abnormal bleeding ? ?Physical Exam: ?Current and past weights: weight as of 11/02/21 200.2 lb, as of 01/11/22 was 177 lbs ?Constitutional: NAD, pale ?General: frail appearing, WD ?EYES: anicteric sclera, lids intact, no discharge  ?ENMT: intact hearing, oral mucous membranes moist, dentition intact ?CV: S1S2,  irregular rhythm with Grade 3 holosytolic squeeking murmur, controlled rate, Bipedal edema ?Pulmonary: CTAB, no increased work of breathing, no cough, room air ?Abdomen: hypo-active BS + 4 quadrants, mod firm and non tender, moderate ascites ?GU: deferred ?MSK: no sarcopenia, moves all extremities, ambulatory with rolling walker ?Skin: warm and dry, no rashes or wounds on visible skin ?Neuro:  no generalized weakness,  no cognitive impairment ?Psych: non-anxious affect, A and O x  3 ?Hem/lymph/immuno: no widespread bruising ? ? ?Thank you for the opportunity to participate in the care of Mr. Swopes.  The palliative care team will continue to follow. Please call our office at [phone]

## 2022-01-19 ENCOUNTER — Ambulatory Visit (HOSPITAL_COMMUNITY)
Admission: RE | Admit: 2022-01-19 | Discharge: 2022-01-19 | Disposition: A | Discharge status: Home / Self Care | Payer: Medicare HMO | Source: Ambulatory Visit | Attending: Internal Medicine | Admitting: Internal Medicine

## 2022-01-19 DIAGNOSIS — K7031 Alcoholic cirrhosis of liver with ascites: Secondary | ICD-10-CM | POA: Insufficient documentation

## 2022-01-19 DIAGNOSIS — Z947 Corneal transplant status: Secondary | ICD-10-CM | POA: Diagnosis not present

## 2022-01-19 DIAGNOSIS — R188 Other ascites: Secondary | ICD-10-CM | POA: Diagnosis not present

## 2022-01-19 DIAGNOSIS — T868419 Corneal transplant failure, unspecified eye: Secondary | ICD-10-CM | POA: Diagnosis not present

## 2022-01-19 DIAGNOSIS — H18522 Epithelial (juvenile) corneal dystrophy, left eye: Secondary | ICD-10-CM | POA: Diagnosis not present

## 2022-01-19 DIAGNOSIS — K7460 Unspecified cirrhosis of liver: Secondary | ICD-10-CM | POA: Diagnosis not present

## 2022-01-19 DIAGNOSIS — Z961 Presence of intraocular lens: Secondary | ICD-10-CM | POA: Diagnosis not present

## 2022-01-19 HISTORY — PX: IR PARACENTESIS: IMG2679

## 2022-01-19 LAB — BODY FLUID CELL COUNT WITH DIFFERENTIAL
Eos, Fluid: 0 %
Lymphs, Fluid: 32 %
Monocyte-Macrophage-Serous Fluid: 62 % (ref 50–90)
Neutrophil Count, Fluid: 6 % (ref 0–25)
Total Nucleated Cell Count, Fluid: 114 cu mm (ref 0–1000)

## 2022-01-19 LAB — GRAM STAIN

## 2022-01-19 MED ORDER — ALBUMIN HUMAN 25 % IV SOLN
50.0000 g | Freq: Once | INTRAVENOUS | Status: AC
  Filled 2022-01-19: qty 200

## 2022-01-19 MED ORDER — LIDOCAINE HCL 1 % IJ SOLN
INTRAMUSCULAR | Status: AC
  Filled 2022-01-19: qty 20

## 2022-01-19 MED ORDER — LIDOCAINE HCL (PF) 1 % IJ SOLN
INTRAMUSCULAR | Status: DC | PRN
  Administered 2022-01-19: 10 mL

## 2022-01-19 MED ORDER — ALBUMIN HUMAN 25 % IV SOLN
INTRAVENOUS | Status: AC
  Administered 2022-01-19: 50 g via INTRAVENOUS
  Filled 2022-01-19: qty 200

## 2022-01-19 NOTE — Procedures (Signed)
PROCEDURE SUMMARY: ? ?Successful ultrasound guided paracentesis from the right lateral abdomen.  ?Yielded 5.0L of clear, yellow fluid.  ?No immediate complications.  ?The patient tolerated the procedure well.  ? ?Specimen was sent for labs. ? ?EBL < 39m ? ?The patient has required >/=2 paracenteses in a 30 day period and a formal evaluation by the [facility] has been arranged. ? ? ?DRuthann Cancer MD ?Pager: [phone]? ? ?

## 2022-01-20 ENCOUNTER — Telehealth: Payer: Self-pay | Admitting: Pharmacist

## 2022-01-20 LAB — PATHOLOGIST SMEAR REVIEW

## 2022-01-20 NOTE — Progress Notes (Signed)
? ?Chronic Care Management ?Pharmacy Note ? ?01/26/2022 ?Name:  [name] MRN:  [id number] DOB:  01/22/1941 ? ?Recommendations/Changes made from today's visit: no Rx changes.  Patient is a little unclear of his medication list.  Will consult with other providers and try to get him a comprehensive list.  He has stopped many of his chronic medications. ? ?Subjective: ?[name] is an 81 y.o. year old male who is a primary patient of Hunter, Brayton Mars, MD.  The CCM team was consulted for assistance with disease management and care coordination needs.   ? ?Engaged with patient by telephone for follow up visit in response to provider referral for pharmacy case management and/or care coordination services.  ? ?Consent to Services:  ?The patient was given information about Chronic Care Management services, agreed to services, and gave verbal consent prior to initiation of services.  Please see initial visit note for detailed documentation.  ? ?Patient Care Team: ?[name], MD as PCP - General (Family Medicine) ?Satira Sark, MD as PCP - Cardiology (Cardiology) ?Marilynne Halsted, MD as Referring Physician (Ophthalmology) ?Brunetta Genera, MD as Consulting Physician (Hematology) ?Pyrtle, Lajuan Lines, MD as Consulting Physician (Gastroenterology) ?Marzetta Board, DPM as Consulting Physician (Podiatry) ?[name], [facility] (Pharmacist) ? ?Recent office visits:  ?01/11/2022 OV (PCP) [name], MD; no medication changes indicated. ?  ?Recent consult visits:  ?01/19/2022 OV (Ophthalmology) Marilynne Halsted, MD; no further information available. ?  ?01/03/2022 OV (Ophthalmology) Marilynne Halsted, MD; no medication changes indicated. ?  ?12/23/2021 OV (Gastroenterology) Hilarie Fredrickson Lajuan Lines, MD; no medication changes indicated. ?  ?12/21/2021 OV (Podiatry) Holland Commons L, DPM; no medication changes indicated. ?  ?12/03/2021 OV (Podiatry) Felipa Furnace, DPM; no medication changes  indicated. ?  ?Hospital visits:  ?None since last adherence Call ?Objective: ? ?Lab Results  ?Component Value Date  ? CREATININE 1.53 (H) 01/11/2022  ? CREATININE 2.0 (A) 01/06/2022  ? CREATININE 2.40 (H) 01/05/2022  ? ? ?Lab Results  ?Component Value Date  ? HGBA1C 5.4 11/03/2021  ? ?Last diabetic Eye exam:  ?Lab Results  ?Component Value Date/Time  ? HMDIABEYEEXA No Retinopathy 01/24/2018 12:00 AM  ?  ?Last diabetic Foot exam: No results found for: HMDIABFOOTEX  ? ?   ?Component Value Date/Time  ? CHOL 120 11/07/2019 1146  ? TRIG 126.0 11/07/2019 1146  ? HDL 45.60 11/07/2019 1146  ? CHOLHDL 3 11/07/2019 1146  ? VLDL 25.2 11/07/2019 1146  ? LDLCALC 49 11/07/2019 1146  ? LDLDIRECT 57.0 10/22/2018 1451  ? ? ? ?  Latest Ref Rng & Units 01/06/2022  ? 12:00 AM 12/23/2021  ? 11:56 AM 11/02/2021  ?  9:05 PM  ?Hepatic Function  ?Total Protein 6.0 - 8.3 g/dL  6.4   5.9    ?Albumin 3.5 - 5.0 3.6   3.7   2.7    ?AST 0 - 37 U/L  22   47    ?ALT 0 - 53 U/L  10   25    ?Alk Phosphatase 39 - 117 U/L  96   240    ?Total Bilirubin 0.2 - 1.2 mg/dL  0.4   0.9    ? ? ?Lab Results  ?Component Value Date/Time  ? TSH 4.45 07/28/2021 12:06 PM  ? TSH 4.225 12/04/2019 03:12 AM  ? TSH 0.824 02/13/2018 03:50 PM  ? ? ? ?  Latest Ref Rng & Units 12/23/2021  ? 11:56 AM 11/10/2021  ? 10:01 AM  11/04/2021  ?  4:19 AM  ?CBC  ?WBC 4.0 - 10.5 K/uL 4.3   7.0   5.2    ?Hemoglobin 13.0 - 17.0 g/dL 8.3 Repeated and verified X2.   9.9   8.8    ?Hematocrit 39.0 - 52.0 % 25.4 Repeated and verified X2.   29.3   26.6    ?Platelets 150.0 - 400.0 K/uL 98.0   169.0   119    ? ? ?Lab Results  ?Component Value Date/Time  ? VD25OH 28.1 01/06/2022 12:00 AM  ? VD25OH 39 08/23/2011 08:32 AM  ? ? ?Clinical ASCVD:  ?The ASCVD Risk score (Arnett DK, et al., 2019) failed to calculate for the following reasons: ?  The 2019 ASCVD risk score is only valid for ages 69 to 86   ? ?Other: (CHADS2VASc if Afib, PHQ9 if depression, MMRC or CAT for COPD, ACT, DEXA) ? ?Social History   ? ?Tobacco Use  ?Smoking Status Former  ? Packs/day: 4.00  ? Years: 20.00  ? Pack years: 80.00  ? Types: Cigarettes  ? Quit date: 09/18/1984  ? Years since quitting: 37.3  ?Smokeless Tobacco Never  ?Tobacco Comments  ? quit in 1980  ? ?BP Readings from Last 3 Encounters:  ?01/19/22 103/60  ?01/18/22 92/64  ?01/12/22 (!) 76/45  ? ?Pulse Readings from Last 3 Encounters:  ?01/18/22 84  ?01/11/22 (!) 58  ?01/04/22 92  ? ?Wt Readings from Last 3 Encounters:  ?01/11/22 177 lb (80.3 kg)  ?12/23/21 173 lb (78.5 kg)  ?11/16/21 169 lb 12.8 oz (77 kg)  ? ? ?Assessment: Review of patient past medical history, allergies, medications, health status, including review of consultants reports, laboratory and other test data, was performed as part of comprehensive evaluation and provision of chronic care management services.  ? ?SDOH:  (Social Determinants of Health) assessments and interventions performed: Yes ? ? ?[place] ? ?No Known Allergies ? ?Medications Reviewed Today   ? ? Reviewed by [name], RPH (Pharmacist) on 01/26/22 at 1542  Med List Status: <None>  ? ?Medication Order Taking? Sig Documenting Provider Last Dose Status Informant  ?albuterol (VENTOLIN HFA) 108 (90 Base) MCG/ACT inhaler [id number] Yes Inhale 2 puffs into the lungs every 6 (six) hours as needed for wheezing or shortness of breath. [name], MD Taking Active Self  ?aspirin EC 81 MG tablet [id number] Yes Take 81 mg by mouth daily. [provider] Taking Active Self  ?busPIRone (BUSPAR) 7.5 MG tablet [id number] Yes Take 1 tablet (7.5 mg total) by mouth 2 (two) times daily as needed. for anxiety  ?Patient taking differently: Take 7.5 mg by mouth at bedtime.  ? [name], MD Taking Active Self  ?carvedilol (COREG) 6.25 MG tablet [id number] Yes Take 1 tablet (6.25 mg total) by mouth 2 (two) times daily with a meal. Satira Sark, MD Taking Active Self  ?escitalopram (LEXAPRO) 5 MG tablet [id number] Yes Take 1 tablet (5  mg total) by mouth daily. [name], MD Taking Active Self  ?famotidine (PEPCID) 20 MG tablet [id number] Yes TAKE 1 TABLET TWICE DAILY [name], MD Taking Active   ?finasteride (PROSCAR) 5 MG tablet [id number] Yes TAKE 1 TABLET EVERY DAY [name], MD Taking Active   ?gabapentin (NEURONTIN) 300 MG capsule [id number] Yes TAKE 1 CAPSULE TWICE DAILY  ?Patient taking differently: Take 300 mg by mouth 2 (two) times daily.  ? [name], MD Taking Active Self  ?Multiple Vitamin (MULTIVITAMIN WITH  MINERALS) TABS tablet [id number] Yes Take 1 tablet by mouth daily. Elmarie Shiley, MD Taking Active Self  ?rosuvastatin (CRESTOR) 5 MG tablet [id number] Yes TAKE 1 TABLET EVERY DAY [name], MD Taking Active   ?tamsulosin (FLOMAX) 0.4 MG CAPS capsule [id number] Yes TAKE 1 CAPSULE EVERY DAY  ?Patient taking differently: Take 0.4 mg by mouth daily.  ? [name], MD Taking Active Self  ? ?  ?  ? ?  ? ? ?Patient Active Problem List  ? Diagnosis Date Noted  ? Recurrent major depressive disorder, in full remission ([place]) 11/16/2021  ? Protein-calorie malnutrition, severe 11/05/2021  ? Sepsis ([place]) 11/04/2021  ? Acute lower UTI 11/04/2021  ? Other ascites   ? Sepsis due to Spontaneous bacterial peritonitis ([place]) 11/03/2021  ? Malnutrition of moderate degree 07/14/2021  ? Cirrhosis of liver with ascites ([place])   ? Mucosal abnormality of stomach   ? Portal hypertensive gastropathy (HCC)   ? 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)   ? Generalized weakness 07/13/2021  ? Chronic diarrhea 07/13/2021  ? 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 Iron deficiency anemia due to chronic blood loss 04/19/2021  ? 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 Anxiety 02/25/2020  ? 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[place]) 12/14/2019  ? Aortic atherosclerosis ([place]) 12/14/2019  ? Chronic systolic heart failure ([place]) 12/05/2019  ? Macrocytic anemia 03/05/2018  ? GERD (gastroesophageal  reflux disease) 09/30/2017  ? 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 positive 03/29/2016  ? Thrombocytopenia ([place]) 12/21/2015  ? B12 deficiency 08/20/2015  ? 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 Diabetic polyneuropathy associated with type

## 2022-01-20 NOTE — Progress Notes (Deleted)
? ? ?  Chronic Care Management ?Pharmacy Assistant  ? ?Name: [name]  MRN: [id number] DOB: [birth year] ? ? ?Reason for Encounter: General Adherence Call ? ? ?Recent office visits:  ?01/11/2022 OV (PCP) [name], MD;  ? ?Recent consult visits:  ?01/19/2022 OV (Ophthalmology) Marilynne Halsted, MD; no further information available. ? ?Hospital visits:  ?{Hospital DC Yes/No:[id number]} ? ?Medications: ?Outpatient Encounter Medications as of 01/20/2022  ?Medication Sig  ? albuterol (VENTOLIN HFA) 108 (90 Base) MCG/ACT inhaler Inhale 2 puffs into the lungs every 6 (six) hours as needed for wheezing or shortness of breath.  ? aspirin EC 81 MG tablet Take 81 mg by mouth daily.  ? busPIRone (BUSPAR) 7.5 MG tablet Take 1 tablet (7.5 mg total) by mouth 2 (two) times daily as needed. for anxiety (Patient taking differently: Take 7.5 mg by mouth at bedtime.)  ? carvedilol (COREG) 6.25 MG tablet Take 1 tablet (6.25 mg total) by mouth 2 (two) times daily with a meal.  ? escitalopram (LEXAPRO) 5 MG tablet Take 1 tablet (5 mg total) by mouth daily.  ? famotidine (PEPCID) 20 MG tablet TAKE 1 TABLET TWICE DAILY  ? finasteride (PROSCAR) 5 MG tablet TAKE 1 TABLET EVERY DAY  ? gabapentin (NEURONTIN) 300 MG capsule TAKE 1 CAPSULE TWICE DAILY (Patient taking differently: Take 300 mg by mouth 2 (two) times daily.)  ? Multiple Vitamin (MULTIVITAMIN WITH MINERALS) TABS tablet Take 1 tablet by mouth daily.  ? rosuvastatin (CRESTOR) 5 MG tablet TAKE 1 TABLET EVERY DAY  ? tamsulosin (FLOMAX) 0.4 MG CAPS capsule TAKE 1 CAPSULE EVERY DAY (Patient taking differently: Take 0.4 mg by mouth daily.)  ? ?No facility-administered encounter medications on file as of 01/20/2022.  ? ? ?Care Gaps: ? ?Star Rating Drugs: ? ?SIG*** ?

## 2022-01-20 NOTE — Progress Notes (Signed)
? ? ?Chronic Care Management ?Pharmacy Assistant  ? ?Name: [name]  MRN: [id number] DOB: [birth year] ? ? ?Reason for Encounter: General Adherence Call ? ? ?Recent office visits:  ?01/11/2022 OV (PCP) [name], MD; no medication changes indicated. ? ?Recent consult visits:  ?01/19/2022 OV (Ophthalmology) Marilynne Halsted, MD; no further information available. ? ?01/03/2022 OV (Ophthalmology) Marilynne Halsted, MD; no medication changes indicated. ? ?12/23/2021 OV (Gastroenterology) Hilarie Fredrickson Lajuan Lines, MD; no medication changes indicated. ? ?12/21/2021 OV (Podiatry) Holland Commons L, DPM; no medication changes indicated. ? ?12/03/2021 OV (Podiatry) Felipa Furnace, DPM; no medication changes indicated. ? ?Hospital visits:  ?None since last adherence Call ? ?Medications: ?Outpatient Encounter Medications as of 01/20/2022  ?Medication Sig  ? albuterol (VENTOLIN HFA) 108 (90 Base) MCG/ACT inhaler Inhale 2 puffs into the lungs every 6 (six) hours as needed for wheezing or shortness of breath.  ? aspirin EC 81 MG tablet Take 81 mg by mouth daily.  ? busPIRone (BUSPAR) 7.5 MG tablet Take 1 tablet (7.5 mg total) by mouth 2 (two) times daily as needed. for anxiety (Patient taking differently: Take 7.5 mg by mouth at bedtime.)  ? carvedilol (COREG) 6.25 MG tablet Take 1 tablet (6.25 mg total) by mouth 2 (two) times daily with a meal.  ? escitalopram (LEXAPRO) 5 MG tablet Take 1 tablet (5 mg total) by mouth daily.  ? famotidine (PEPCID) 20 MG tablet TAKE 1 TABLET TWICE DAILY  ? finasteride (PROSCAR) 5 MG tablet TAKE 1 TABLET EVERY DAY  ? gabapentin (NEURONTIN) 300 MG capsule TAKE 1 CAPSULE TWICE DAILY (Patient taking differently: Take 300 mg by mouth 2 (two) times daily.)  ? Multiple Vitamin (MULTIVITAMIN WITH MINERALS) TABS tablet Take 1 tablet by mouth daily.  ? rosuvastatin (CRESTOR) 5 MG tablet TAKE 1 TABLET EVERY DAY  ? tamsulosin (FLOMAX) 0.4 MG CAPS capsule TAKE 1 CAPSULE EVERY DAY (Patient taking  differently: Take 0.4 mg by mouth daily.)  ? ?No facility-administered encounter medications on file as of 01/20/2022.  ? ?Patient Questions: ?Have you had any problems recently with your health? ?Patient's daughter states the patient is having a lot of health issues. She is concerned about his shortness of breath. She states he was discontinued from taking one of his hear medications and would like to speak with the pharmacist about any other alternatives that are available. ? ?Have you had any problems with your pharmacy? ?Patient's daughter denies the patient having any problems with his pharmacy. ? ?What issues or side effects are you having with your medications? ?Patient's daughter denies the patient having any issues or side effects with any of his medications at this time. She did say that the patient has been having some shortness of breath and some vomiting. ? ?What would you like me to pass along to Leata Mouse, CPP for him to help you with?  ?Follow up appointment scheduled for Monday 01/24/2022 at 4:00 pm. ? ?Care Gaps: ?Medicare Annual Wellness: Completed 09/2021 ?Foot Exam: Next due on 05/14/2022 ?Hemoglobin A1C: 5.4% on 11/03/2021 ?Colonoscopy: Next due on 07/14/2022 ? ?Future Appointments  ?Date Time Provider [place]  ?01/27/2022 11:00 AM [facility] None  ?01/27/2022 11:40 AM Brunetta Genera, MD [facility] None  ?02/01/2022  1:20 PM [name], MD [facility]  ?02/07/2022  3:20 PM Satira Sark, MD [facility]  ?02/15/2022  3:20 PM [name], MD [facility]  ?02/25/2022  1:15 PM Marzetta Board, DPM [facility]  ?02/28/2022  11:00 AM Pyrtle, Lajuan Lines, MD [facility]  ?09/23/2022  2:30 PM [facility] HEALTH COACH [facility]  ? ?Star Rating Drugs: ?Rosuvastatin 10 mg last filled 01/06/2022 90 DS ?Entresto 24-26 mg last filled 01/04/2022 90 DS ? ?April D Cal[place], [place] ?Clinical Pharmacist Assistant ?[phone] ?

## 2022-01-24 ENCOUNTER — Ambulatory Visit (INDEPENDENT_AMBULATORY_CARE_PROVIDER_SITE_OTHER): Payer: Medicare HMO | Admitting: Pharmacist

## 2022-01-24 DIAGNOSIS — E118 Type 2 diabetes mellitus with unspecified complications: Secondary | ICD-10-CM

## 2022-01-24 DIAGNOSIS — I10 Essential (primary) hypertension: Secondary | ICD-10-CM

## 2022-01-24 LAB — CULTURE, BODY FLUID W GRAM STAIN -BOTTLE: Culture: NO GROWTH

## 2022-01-25 ENCOUNTER — Other Ambulatory Visit: Payer: Self-pay

## 2022-01-25 DIAGNOSIS — C220 Liver cell carcinoma: Secondary | ICD-10-CM

## 2022-01-26 NOTE — Patient Instructions (Addendum)
Visit Information ? ? Goals Addressed   ?None ?  ? ?Patient Care Plan: ccm pharmacy care plan  ?  ? ?Problem Identified: DMII HTN HLD CKD CHF COPD Depression Anxiety   ?Priority: High  ?  ? ?Long-Range Goal: Disease Management   ?Start Date: 03/15/2021  ?Expected End Date: 03/15/2022  ?Recent Progress: On track  ?Priority: High  ?Note:   ?Current Barriers:  ?Does not contact provider office for questions/concerns ? ?Pharmacist Clinical Goal(s):  ?Patient will contact provider office for questions/concerns as evidenced notation of same in electronic health record through collaboration with PharmD and provider.  ? ?Interventions: ?1:1 collaboration with Joshua Olp, MD regarding development and update of comprehensive plan of care as evidenced by provider attestation and co-signature ?Inter-disciplinary care team collaboration (see longitudinal plan of care) ?Comprehensive medication review performed; medication list updated in electronic medical record ?No Rx Changes ? ?Hypertension (BP goal <130/80) ?-Controlled ?-Current treatment: ?Carvedilol 6.25 mg twice daily Appropriate, Effective, Safe, Accessible ? ?-Current home readings: readings never high. Denies hypotensive/hypertensive symptoms ?-Educated on BP goals and benefits of medications for prevention of heart attack, stroke and kidney damage; ?-Counseled to monitor BP at home as directed, document, and provide log at future appointments ?-Recommended to continue current medication  ?Biggest complaint is Entresto cost and going into donut hole - previously sent to patient but misplaced, will send back out for patient to complete and provide to cardiology. ? ?Update 01/26/22 ?His BP is controlled at home.  Denies any dizziness or HA. ?He is a little unclear on what he is supposed to be taking, will consult with other providers. ?Continue on current meds with no changes at this time. ?He was unable to tolerate diuretics. ? ?Diabetes (A1c goal  <7%) ?-Controlled ?-Current medications: None ?-Current home glucose readings: 130s-150s - tests at random throughout day and at night. 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 Would like to do more but limited by fatigue.  ?-Reviewed side effects - tolerating well - previous/current GI symptoms likely not related to metformin use ?-Educated on A1c and blood sugar goals; ?-Counseled to check feet daily and get yearly eye exams ?-Counseled on diet and exercise extensively ?Recommended to continue current medication ? ?Update 01/26/22 ?Most recent A1c is excellent.  No longer taking metformin due to renal function. ?A1c controlled with no meds.  Congratulated on this. ?Continue to keep up the good work and keep an eye on your sugar! ?No changes at this time. ? ?Patient does mention he would like a list of the medications he is supposed to be taking.  Will try to gather this for him and send in the mail.  Conflicting date between various provider notes. ? ? ? ?Patient Goals/Self-Care Activities ?Patient will:  ?- check glucose as directed, document, and provide at future appointments ? ?Follow Up Plan: FU 6 months, mail med list ?  ? ?  ?  ? ?The patient verbalized understanding of instructions, educational materials, and care plan provided today and declined offer to receive copy of patient instructions, educational materials, and care plan.  ?Telephone follow up appointment with pharmacy team member scheduled for: 6 months ? ?[name], [facility]  ?[name], PharmD ?Clinical Pharmacist  ?[name] ?[phone] ? ?

## 2022-01-27 ENCOUNTER — Inpatient Hospital Stay: Payer: Medicare HMO | Attending: Hematology | Admitting: Hematology

## 2022-01-27 ENCOUNTER — Other Ambulatory Visit: Payer: Self-pay

## 2022-01-27 ENCOUNTER — Inpatient Hospital Stay: Payer: Medicare HMO

## 2022-01-27 VITALS — BP 105/60 | HR 101 | Temp 97.9°F | Resp 18 | Wt 180.0 lb

## 2022-01-27 DIAGNOSIS — D500 Iron deficiency anemia secondary to blood loss (chronic): Secondary | ICD-10-CM

## 2022-01-27 DIAGNOSIS — E538 Deficiency of other specified B group vitamins: Secondary | ICD-10-CM | POA: Insufficient documentation

## 2022-01-27 DIAGNOSIS — Z87891 Personal history of nicotine dependence: Secondary | ICD-10-CM | POA: Diagnosis not present

## 2022-01-27 DIAGNOSIS — D649 Anemia, unspecified: Secondary | ICD-10-CM | POA: Insufficient documentation

## 2022-01-27 DIAGNOSIS — C220 Liver cell carcinoma: Secondary | ICD-10-CM | POA: Diagnosis not present

## 2022-01-27 LAB — CMP (CANCER CENTER ONLY)
ALT: 10 U/L (ref 0–44)
AST: 19 U/L (ref 15–41)
Albumin: 3 g/dL — ABNORMAL LOW (ref 3.5–5.0)
Alkaline Phosphatase: 110 U/L (ref 38–126)
Anion gap: 4 — ABNORMAL LOW (ref 5–15)
BUN: 42 mg/dL — ABNORMAL HIGH (ref 8–23)
CO2: 24 mmol/L (ref 22–32)
Calcium: 8.8 mg/dL — ABNORMAL LOW (ref 8.9–10.3)
Chloride: 108 mmol/L (ref 98–111)
Creatinine: 1.69 mg/dL — ABNORMAL HIGH (ref 0.61–1.24)
GFR, Estimated: 40 mL/min — ABNORMAL LOW (ref >60)
Glucose, Bld: 152 mg/dL — ABNORMAL HIGH (ref 70–99)
Potassium: 5.2 mmol/L — ABNORMAL HIGH (ref 3.5–5.1)
Sodium: 136 mmol/L (ref 135–145)
Total Bilirubin: 0.5 mg/dL (ref 0.3–1.2)
Total Protein: 5.5 g/dL — ABNORMAL LOW (ref 6.5–8.1)

## 2022-01-27 LAB — CBC WITH DIFFERENTIAL (CANCER CENTER ONLY)
Abs Immature Granulocytes: 0.01 10*3/uL (ref 0.00–0.07)
Basophils Absolute: 0 10*3/uL (ref 0.0–0.1)
Basophils Relative: 1 %
Eosinophils Absolute: 0 10*3/uL (ref 0.0–0.5)
Eosinophils Relative: 1 %
HCT: 20.9 % — ABNORMAL LOW (ref 39.0–52.0)
Hemoglobin: 6.3 g/dL — CL (ref 13.0–17.0)
Immature Granulocytes: 0 %
Lymphocytes Relative: 12 %
Lymphs Abs: 0.5 10*3/uL — ABNORMAL LOW (ref 0.7–4.0)
MCH: 31.5 pg (ref 26.0–34.0)
MCHC: 30.1 g/dL (ref 30.0–36.0)
MCV: 104.5 fL — ABNORMAL HIGH (ref 80.0–100.0)
Monocytes Absolute: 0.4 10*3/uL (ref 0.1–1.0)
Monocytes Relative: 9 %
Neutro Abs: 3.3 10*3/uL (ref 1.7–7.7)
Neutrophils Relative %: 77 %
Platelet Count: 124 10*3/uL — ABNORMAL LOW (ref 150–400)
RBC: 2 MIL/uL — ABNORMAL LOW (ref 4.22–5.81)
RDW: 16.6 % — ABNORMAL HIGH (ref 11.5–15.5)
WBC Count: 4.3 10*3/uL (ref 4.0–10.5)
nRBC: 0 % (ref 0.0–0.2)

## 2022-01-27 LAB — IRON AND IRON BINDING CAPACITY (CC-WL,HP ONLY)
Iron: 79 ug/dL (ref 45–182)
Saturation Ratios: 31 % (ref 17.9–39.5)
TIBC: 259 ug/dL (ref 250–450)
UIBC: 180 ug/dL (ref 117–376)

## 2022-01-27 LAB — PREPARE RBC (CROSSMATCH)

## 2022-01-27 LAB — FERRITIN: Ferritin: 18 ng/mL — ABNORMAL LOW (ref 24–336)

## 2022-01-27 MED ORDER — METHYLPREDNISOLONE SODIUM SUCC 40 MG IJ SOLR
40.0000 mg | Freq: Once | INTRAMUSCULAR | Status: AC
  Administered 2022-01-27: 40 mg via INTRAVENOUS
  Filled 2022-01-27: qty 1

## 2022-01-27 MED ORDER — SODIUM CHLORIDE 0.9% IV SOLUTION
250.0000 mL | Freq: Once | INTRAVENOUS | Status: AC
  Administered 2022-01-27: 250 mL via INTRAVENOUS

## 2022-01-27 MED ORDER — ACETAMINOPHEN 325 MG PO TABS
650.0000 mg | ORAL_TABLET | Freq: Once | ORAL | Status: AC
  Administered 2022-01-27: 650 mg via ORAL
  Filled 2022-01-27: qty 2

## 2022-01-27 MED ORDER — SODIUM CHLORIDE 0.9% FLUSH: 3.0000 mL | INTRAVENOUS | Status: DC | PRN

## 2022-01-27 NOTE — Progress Notes (Signed)
? ? ?HEMATOLOGY/ONCOLOGY CLINIC NOTE ? ?Date of Service: 01/27/2022 ? ?Patient Care Team: ?[name], MD as PCP - General (Family Medicine) ?Satira Sark, MD as PCP - Cardiology (Cardiology) ?Marilynne Halsted, MD as Referring Physician (Ophthalmology) ?Brunetta Genera, MD as Consulting Physician (Hematology) ?Pyrtle, Lajuan Lines, MD as Consulting Physician (Gastroenterology) ?Marzetta Board, DPM as Consulting Physician (Podiatry) ?[name], [facility] (Pharmacist) ? ?CHIEF COMPLAINTS/PURPOSE OF CONSULTATION:  ?F/u for continued evaluation and management of [name] and anemia.  ? ?HISTORY OF PRESENTING ILLNESS:  ?Please see previous note for details on initial presentation ? ?Interval History:  ? ?[name] is a 81 y.o. male here for f/u for continued evaluation and management of HCC and anemia.  ? ?He reports some recent black stools. We discussed scheduling f/u with Dr. Hilarie Fredrickson for consideration of endoscopy following concern for upper GI bleeding. We further discussed that due to drop in counts, he was recommended to referral for ED visit which he has declined at this time. We discussed that higher amount of blood in GI tract could also be causing increase in potassium count. We further discussed that due to significant anemia that count deficiencies that he may need a transfusion today due to his decline for ED visit at this time which he was agreeable to.  ? 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 Billie Lade. We discussed that his cirrhosis may be causing an increase in some counts. ? ?No other new or acute focal symptoms. ? ?We discussed and patient wants to continue to take a conservative approach. 10 Point review of Systems was done is negative except as noted above. ? ?Labs done today 01/27/2022 reviewed in detail. ?CBC shows macrocytic anemia with RBC count of 2.00K/uL, MCV of 104.5, Decreased HCT of 20.9%, and decreased hemoglobin of 6.3g/dL. Thrombocytopenia with  platelet count of 124K/uL. ALC decreased at 0.5K/uL ?CMP WNL except for potassium of 5.2, elevated bld glucose of 152, elevated BUN of 42, elevated creatinine of 1.69 and GFR est of 40 mL/min consistent with chronic kidney disease, and mild hypocalcemia with calcium of 8.8, total protein of 5.5 g/dL, and albumin of 3.0. ? ?We discussed upper endoscopy done 07/14/2021 which revealed "- Grade I esophageal varices. No stigmata. Do not recommend banding or nonselective ?beta blockers ?- Portal hypertensive gastropathy. Biopsied. ?- Focal flat lesion in the gastric body. This could be consistent with a healing gastric ulcer, ?but appearance was atypical. Biopsied to rule out dysplasia. ?- Normal examined duodenum. Biopsied." ? ?MEDICAL HISTORY:  ?Past Medical History:  ?Diagnosis Date  ? Benign prostatic hypertrophy   ? Blood transfusion without reported diagnosis   ? CHF (congestive heart failure) ([place])   ? a. EF 20-25% by echo in 11/2019  ? COPD (chronic obstructive pulmonary disease) ([place]) 12/15/2009  ? FEV1 2.30 (70%) ratio 63 no better with B2 and DLCO 18/6 (73%) corrects to 106%  ? Degenerative joint disease   ? Depression   ? Diverticulosis   ? Esophageal varices (H[facility])   ? Essential hypertension   ? ACE inhibitor cough  ? Fatty liver   ? Hepatic cirrhosis ([place])   ? Hepatocellular carcinoma ([place])   ? Hyperlipidemia   ? IDA (iron deficiency anemia)   ? Internal hemorrhoids   ? Liver cancer ([place])   ? Low back pain   ? Otitis externa 07/30/2012  ? Portal hypertensive gastropathy (H[facility])   ? SBP (spontaneous bacterial peritonitis) ([place])   ?  Sepsis ([place])   ? Splenomegaly   ? Thrombocytopenia ([place])   ? Type 2 diabetes mellitus ([place])   ? Ulcer of foot ([place])   ? Right foot  ? ? ?SURGICAL HISTORY: ?Past Surgical History:  ?Procedure Laterality Date  ? BIOPSY  07/14/2021  ? Procedure: BIOPSY;  Surgeon: Daryel November, MD;  Location: [facility] ENDOSCOPY;  Service: Gastroenterology;;  EGD and COLON  ? COLONOSCOPY  2020  ?  JMP-MAC-plenvu (good)-polyps-recall 1 yr  ? COLONOSCOPY WITH PROPOFOL N/A 07/14/2021  ? Procedure: COLONOSCOPY WITH PROPOFOL;  Surgeon: Daryel November, MD;  Location: [facility];  Service: Gastroenterology;  Laterality: N/A;  ? ESOPHAGOGASTRODUODENOSCOPY (EGD) WITH PROPOFOL N/A 07/14/2021  ? Procedure: ESOPHAGOGASTRODUODENOSCOPY (EGD) WITH PROPOFOL;  Surgeon: Daryel November, MD;  Location: [facility];  Service: Gastroenterology;  Laterality: N/A;  ? IR EMBO TUMOR ORGAN ISCHEMIA INFARCT INC GUIDE ROADMAPPING  06/01/2020  ? IR PARACENTESIS  08/30/2021  ? IR PARACENTESIS  09/22/2021  ? IR PARACENTESIS  10/12/2021  ? IR PARACENTESIS  11/02/2021  ? IR PARACENTESIS  11/15/2021  ? IR PARACENTESIS  11/30/2021  ? IR PARACENTESIS  12/14/2021  ? IR PARACENTESIS  12/27/2021  ? IR PARACENTESIS  01/04/2022  ? IR PARACENTESIS  01/12/2022  ? IR PARACENTESIS  01/19/2022  ? IR RADIOLOGIST EVAL & MGMT  04/28/2020  ? IR RADIOLOGIST EVAL & MGMT  07/15/2020  ? IR RADIOLOGIST EVAL & MGMT  05/04/2021  ? NO PAST SURGERIES  1980  ? ? ?SOCIAL HISTORY: ?Social History  ? ?Socioeconomic History  ? Marital status: Unknown  ?  Spouse name: Not on file  ? Number of children: Not on file  ? Years of education: Not on file  ? Highest education level: Not on file  ?Occupational History  ? Occupation: retired  ?  Comment: maintenence work  ?Tobacco Use  ? Smoking status: Former  ?  Packs/day: 4.00  ?  Years: 20.00  ?  Pack years: 80.00  ?  Types: Cigarettes  ?  Quit date: 09/18/1984  ?  Years since quitting: 37.3  ? Smokeless tobacco: Never  ? Tobacco comments:  ?  quit in 1980  ?Vaping Use  ? Vaping Use: Never used  ?Substance and Sexual Activity  ? Alcohol use: No  ?  Comment: no drinking since 40 years plus  ? Drug use: No  ? Sexual activity: Yes  ?Other Topics Concern  ? Not on file  ?Social History Narrative  ? ** Merged History Encounter **  ?    ? Married 53 years in 2015. 4 kids (2 sons) and 3 grandkids.   ? Lived in [place], [place] wholel life  ?    ? Retired from [employer]  ?   ? Hobbies-rest, going to beach where they have a camper  ?   ?   ?   ? ?Social Determinants of Health  ? ?Financial Resource Strain: Low Risk   ? Difficulty of Paying Living Expenses: Not hard at all  ?Food Insecurity: No Food Insecurity  ? Worried About Charity fundraiser in the Last Year: Never true  ? Ran Out of Food in the Last Year: Never true  ?Transportation Needs: No Transportation Needs  ? Lack of Transportation (Medical): No  ? Lack of Transportation (Non-Medical): No  ?Physical Activity: Inactive  ? Days of Exercise per Week: 0 days  ? Minutes of Exercise per Session: 0 min  ?Stress: No Stress Concern Present  ? Feeling of  Stress : Only a little  ?Social Connections: Moderately Integrated  ? Frequency of Communication with Friends and Family: More than three times a week  ? Frequency of Social Gatherings with Friends and Family: More than three times a week  ? Attends Religious Services: More than 4 times per year  ? Active Member of Clubs or Organizations: No  ? Attends Archivist Meetings: Never  ? Marital Status: Married  ?Intimate Partner Violence: Not At Risk  ? Fear of Current or Ex-Partner: No  ? Emotionally Abused: No  ? Physically Abused: No  ? Sexually Abused: No  ? ? ?FAMILY HISTORY: ?Family History  ?Problem Relation Age of Onset  ? Melanoma Mother   ? Stroke Father   ? CAD Brother   ?     Premature disease  ? Colon polyps Neg Hx   ? Colon cancer Neg Hx   ? Esophageal cancer Neg Hx   ? Stomach cancer Neg Hx   ? Rectal cancer Neg Hx   ? ? ?ALLERGIES:  has No Known Allergies. ? ?MEDICATIONS:  ?Current Outpatient Medications  ?Medication Sig Dispense Refill  ? albuterol (VENTOLIN HFA) 108 (90 Base) MCG/ACT inhaler Inhale 2 puffs into the lungs every 6 (six) hours as needed for wheezing or shortness of breath. 18 g 3  ? aspirin EC 81 MG tablet Take 81 mg by mouth daily.    ? busPIRone (BUSPAR) 7.5 MG tablet Take 1 tablet (7.5 mg total) by mouth 2  (two) times daily as needed. for anxiety (Patient taking differently: Take 7.5 mg by mouth at bedtime.) 180 tablet 3  ? carvedilol (COREG) 6.25 MG tablet Take 1 tablet (6.25 mg total) by mouth 2 (two) times dail

## 2022-01-27 NOTE — Patient Instructions (Signed)
Blood Transfusion, Adult A blood transfusion is a procedure in which you receive blood or a type of blood cell (blood component) through an IV. You may need a blood transfusion when your blood level is low. This may result from a bleeding disorder, illness, injury, or surgery. The blood may come from a donor. You may also be able to donate blood for yourself (autologous blood donation) before a planned surgery. The blood given in a transfusion is made up of different blood components. You may receive: Red blood cells. These carry oxygen to the cells in the body. Platelets. These help your blood to clot. Plasma. This is the liquid part of your blood. It carries proteins and other substances throughout the body. White blood cells. These help you fight infections. If you have hemophilia or another clotting disorder, you may also receive other types of blood products. Tell a health care provider about: Any blood disorders you have. Any previous reactions you have had during a blood transfusion. Any allergies you have. All medicines you are taking, including vitamins, herbs, eye drops, creams, and over-the-counter medicines. Any surgeries you have had. Any medical conditions you have, including any recent fever or cold symptoms. Whether you are pregnant or may be pregnant. What are the risks? Generally, this is a safe procedure. However, problems may occur. The most common problems include: A mild allergic reaction, such as red, swollen areas of skin (hives) and itching. Fever or chills. This may be the body's response to new blood cells received. This may occur during or up to 4 hours after the transfusion. More serious problems may include: Transfusion-associated circulatory overload (TACO), or too much fluid in the lungs. This may cause breathing problems. A serious allergic reaction, such as difficulty breathing or swelling around the face and lips. Transfusion-related acute lung injury  (TRALI), which causes breathing difficulty and low oxygen in the blood. This can occur within hours of the transfusion or several days later. Iron overload. This can happen after receiving many blood transfusions over a period of time. Infection or virus being transmitted. This is rare because donated blood is carefully tested before it is given. Hemolytic transfusion reaction. This is rare. It happens when your body's defense system (immune system)tries to attack the new blood cells. Symptoms may include fever, chills, nausea, low blood pressure, and low back or chest pain. Transfusion-associated graft-versus-host disease (TAGVHD). This is rare. It happens when donated cells attack your body's healthy tissues. What happens before the procedure? Medicines Ask your health care provider about: Changing or stopping your regular medicines. This is especially important if you are taking diabetes medicines or blood thinners. Taking medicines such as aspirin and ibuprofen. These medicines can thin your blood. Do not take these medicines unless your health care provider tells you to take them. Taking over-the-counter medicines, vitamins, herbs, and supplements. General instructions Follow instructions from your health care provider about eating and drinking restrictions. You will have a blood test to determine your blood type. This is necessary to know what kind of blood your body will accept and to match it to the donor blood. If you are going to have a planned surgery, you may be able to do an autologous blood donation. This may be done in case you need to have a transfusion. You will have your temperature, blood pressure, and pulse monitored before the transfusion. If you have had an allergic reaction to a transfusion in the past, you may be given medicine to help prevent   a reaction. This medicine may be given to you by mouth (orally) or through an IV. Set aside time for the blood transfusion. This  procedure generally takes 1-4 hours to complete. What happens during the procedure?  An IV will be inserted into one of your veins. The bag of donated blood will be attached to your IV. The blood will then enter through your vein. Your temperature, blood pressure, and pulse will be monitored regularly during the transfusion. This monitoring is done to detect early signs of a transfusion reaction. Tell your nurse right away if you have any of these symptoms during the transfusion: Shortness of breath or trouble breathing. Chest or back pain. Fever or chills. Hives or itching. If you have any signs or symptoms of a reaction, your transfusion will be stopped and you may be given medicine. When the transfusion is complete, your IV will be removed. Pressure may be applied to the IV site for a few minutes. A bandage (dressing)will be applied. The procedure may vary among health care providers and hospitals. What happens after the procedure? Your temperature, blood pressure, pulse, breathing rate, and blood oxygen level will be monitored until you leave the hospital or clinic. Your blood may be tested to see how you are responding to the transfusion. You may be warmed with fluids or blankets to maintain a normal body temperature. If you receive your blood transfusion in an outpatient setting, you will be told whom to contact to report any reactions. Where to find more information For more information on blood transfusions, visit the American Red Cross: redcross.org Summary A blood transfusion is a procedure in which you receive blood or a type of blood cell (blood component) through an IV. The blood you receive may come from a donor or be donated by yourself (autologous blood donation) before a planned surgery. The blood given in a transfusion is made up of different blood components. You may receive red blood cells, platelets, plasma, or white blood cells depending on the condition treated. Your  temperature, blood pressure, and pulse will be monitored before, during, and after the transfusion. After the transfusion, your blood may be tested to see how your body has responded. This information is not intended to replace advice given to you by your health care provider. Make sure you discuss any questions you have with your health care provider. Document Revised: 08/01/2019 Document Reviewed: 03/21/2019 Elsevier Patient Education  2023 Elsevier Inc.  

## 2022-01-28 ENCOUNTER — Other Ambulatory Visit: Payer: Self-pay

## 2022-01-28 ENCOUNTER — Telehealth: Payer: Self-pay | Admitting: Hematology

## 2022-01-28 ENCOUNTER — Inpatient Hospital Stay: Payer: Medicare HMO

## 2022-01-28 DIAGNOSIS — E538 Deficiency of other specified B group vitamins: Secondary | ICD-10-CM | POA: Diagnosis not present

## 2022-01-28 DIAGNOSIS — C9110 Chronic lymphocytic leukemia of B-cell type not having achieved remission: Secondary | ICD-10-CM

## 2022-01-28 DIAGNOSIS — D649 Anemia, unspecified: Secondary | ICD-10-CM

## 2022-01-28 DIAGNOSIS — C220 Liver cell carcinoma: Secondary | ICD-10-CM | POA: Diagnosis not present

## 2022-01-28 DIAGNOSIS — Z87891 Personal history of nicotine dependence: Secondary | ICD-10-CM | POA: Diagnosis not present

## 2022-01-28 LAB — TYPE AND SCREEN
ABO/RH(D): A NEG
Antibody Screen: NEGATIVE
Unit division: 0
Unit division: 0

## 2022-01-28 LAB — BPAM RBC
Blood Product Expiration Date: 202304302359
Blood Product Expiration Date: 202305022359
ISSUE DATE / TIME: 202304201420
Unit Type and Rh: 600
Unit Type and Rh: 600

## 2022-01-28 LAB — AFP TUMOR MARKER: AFP, Serum, Tumor Marker: 2.6 ng/mL (ref 0.0–6.4)

## 2022-01-28 MED ORDER — SODIUM CHLORIDE 0.9% IV SOLUTION: 250.0000 mL | Freq: Once | INTRAVENOUS | Status: DC

## 2022-01-28 MED ORDER — METHYLPREDNISOLONE SODIUM SUCC 40 MG IJ SOLR
40.0000 mg | Freq: Once | INTRAMUSCULAR | Status: AC
  Administered 2022-01-28: 40 mg via INTRAVENOUS
  Filled 2022-01-28: qty 1

## 2022-01-28 MED ORDER — ACETAMINOPHEN 325 MG PO TABS
650.0000 mg | ORAL_TABLET | Freq: Once | ORAL | Status: AC
  Administered 2022-01-28: 650 mg via ORAL
  Filled 2022-01-28: qty 2

## 2022-01-28 NOTE — Telephone Encounter (Signed)
Left message with follow-up appointments per 4/20 los. ?

## 2022-01-28 NOTE — Patient Instructions (Signed)
Blood Transfusion, Adult A blood transfusion is a procedure in which you receive blood or a type of blood cell (blood component) through an IV. You may need a blood transfusion when your blood level is low. This may result from a bleeding disorder, illness, injury, or surgery. The blood may come from a donor. You may also be able to donate blood for yourself (autologous blood donation) before a planned surgery. The blood given in a transfusion is made up of different blood components. You may receive: Red blood cells. These carry oxygen to the cells in the body. Platelets. These help your blood to clot. Plasma. This is the liquid part of your blood. It carries proteins and other substances throughout the body. White blood cells. These help you fight infections. If you have hemophilia or another clotting disorder, you may also receive other types of blood products. Tell a health care provider about: Any blood disorders you have. Any previous reactions you have had during a blood transfusion. Any allergies you have. All medicines you are taking, including vitamins, herbs, eye drops, creams, and over-the-counter medicines. Any surgeries you have had. Any medical conditions you have, including any recent fever or cold symptoms. Whether you are pregnant or may be pregnant. What are the risks? Generally, this is a safe procedure. However, problems may occur. The most common problems include: A mild allergic reaction, such as red, swollen areas of skin (hives) and itching. Fever or chills. This may be the body's response to new blood cells received. This may occur during or up to 4 hours after the transfusion. More serious problems may include: Transfusion-associated circulatory overload (TACO), or too much fluid in the lungs. This may cause breathing problems. A serious allergic reaction, such as difficulty breathing or swelling around the face and lips. Transfusion-related acute lung injury  (TRALI), which causes breathing difficulty and low oxygen in the blood. This can occur within hours of the transfusion or several days later. Iron overload. This can happen after receiving many blood transfusions over a period of time. Infection or virus being transmitted. This is rare because donated blood is carefully tested before it is given. Hemolytic transfusion reaction. This is rare. It happens when your body's defense system (immune system)tries to attack the new blood cells. Symptoms may include fever, chills, nausea, low blood pressure, and low back or chest pain. Transfusion-associated graft-versus-host disease (TAGVHD). This is rare. It happens when donated cells attack your body's healthy tissues. What happens before the procedure? Medicines Ask your health care provider about: Changing or stopping your regular medicines. This is especially important if you are taking diabetes medicines or blood thinners. Taking medicines such as aspirin and ibuprofen. These medicines can thin your blood. Do not take these medicines unless your health care provider tells you to take them. Taking over-the-counter medicines, vitamins, herbs, and supplements. General instructions Follow instructions from your health care provider about eating and drinking restrictions. You will have a blood test to determine your blood type. This is necessary to know what kind of blood your body will accept and to match it to the donor blood. If you are going to have a planned surgery, you may be able to do an autologous blood donation. This may be done in case you need to have a transfusion. You will have your temperature, blood pressure, and pulse monitored before the transfusion. If you have had an allergic reaction to a transfusion in the past, you may be given medicine to help prevent   a reaction. This medicine may be given to you by mouth (orally) or through an IV. Set aside time for the blood transfusion. This  procedure generally takes 1-4 hours to complete. What happens during the procedure?  An IV will be inserted into one of your veins. The bag of donated blood will be attached to your IV. The blood will then enter through your vein. Your temperature, blood pressure, and pulse will be monitored regularly during the transfusion. This monitoring is done to detect early signs of a transfusion reaction. Tell your nurse right away if you have any of these symptoms during the transfusion: Shortness of breath or trouble breathing. Chest or back pain. Fever or chills. Hives or itching. If you have any signs or symptoms of a reaction, your transfusion will be stopped and you may be given medicine. When the transfusion is complete, your IV will be removed. Pressure may be applied to the IV site for a few minutes. A bandage (dressing)will be applied. The procedure may vary among health care providers and hospitals. What happens after the procedure? Your temperature, blood pressure, pulse, breathing rate, and blood oxygen level will be monitored until you leave the hospital or clinic. Your blood may be tested to see how you are responding to the transfusion. You may be warmed with fluids or blankets to maintain a normal body temperature. If you receive your blood transfusion in an outpatient setting, you will be told whom to contact to report any reactions. Where to find more information For more information on blood transfusions, visit the American Red Cross: redcross.org Summary A blood transfusion is a procedure in which you receive blood or a type of blood cell (blood component) through an IV. The blood you receive may come from a donor or be donated by yourself (autologous blood donation) before a planned surgery. The blood given in a transfusion is made up of different blood components. You may receive red blood cells, platelets, plasma, or white blood cells depending on the condition treated. Your  temperature, blood pressure, and pulse will be monitored before, during, and after the transfusion. After the transfusion, your blood may be tested to see how your body has responded. This information is not intended to replace advice given to you by your health care provider. Make sure you discuss any questions you have with your health care provider. Document Revised: 08/01/2019 Document Reviewed: 03/21/2019 Elsevier Patient Education  2023 Elsevier Inc.  

## 2022-01-29 ENCOUNTER — Encounter: Payer: Self-pay | Admitting: Hematology

## 2022-01-29 LAB — BPAM RBC
Blood Product Expiration Date: 202304302359
ISSUE DATE / TIME: 202304211539
Unit Type and Rh: 600

## 2022-01-29 LAB — TYPE AND SCREEN
ABO/RH(D): A NEG
Antibody Screen: NEGATIVE
Unit division: 0

## 2022-01-31 ENCOUNTER — Other Ambulatory Visit: Payer: Self-pay | Admitting: Internal Medicine

## 2022-01-31 ENCOUNTER — Other Ambulatory Visit (HOSPITAL_COMMUNITY): Payer: Self-pay | Admitting: Internal Medicine

## 2022-01-31 DIAGNOSIS — K7031 Alcoholic cirrhosis of liver with ascites: Secondary | ICD-10-CM

## 2022-02-01 ENCOUNTER — Other Ambulatory Visit: Payer: Self-pay

## 2022-02-01 ENCOUNTER — Encounter: Payer: Self-pay | Admitting: Family Medicine

## 2022-02-01 ENCOUNTER — Telehealth: Payer: Self-pay

## 2022-02-01 ENCOUNTER — Ambulatory Visit (INDEPENDENT_AMBULATORY_CARE_PROVIDER_SITE_OTHER): Payer: Medicare HMO | Admitting: Family Medicine

## 2022-02-01 VITALS — BP 110/60 | HR 105 | Temp 97.9°F | Ht 71.0 in

## 2022-02-01 DIAGNOSIS — K921 Melena: Secondary | ICD-10-CM

## 2022-02-01 DIAGNOSIS — J449 Chronic obstructive pulmonary disease, unspecified: Secondary | ICD-10-CM | POA: Diagnosis not present

## 2022-02-01 DIAGNOSIS — N2581 Secondary hyperparathyroidism of renal origin: Secondary | ICD-10-CM | POA: Diagnosis not present

## 2022-02-01 DIAGNOSIS — D638 Anemia in other chronic diseases classified elsewhere: Secondary | ICD-10-CM | POA: Diagnosis not present

## 2022-02-01 DIAGNOSIS — N1832 Chronic kidney disease, stage 3b: Secondary | ICD-10-CM | POA: Diagnosis not present

## 2022-02-01 DIAGNOSIS — E785 Hyperlipidemia, unspecified: Secondary | ICD-10-CM | POA: Diagnosis not present

## 2022-02-01 DIAGNOSIS — I509 Heart failure, unspecified: Secondary | ICD-10-CM

## 2022-02-01 DIAGNOSIS — D696 Thrombocytopenia, unspecified: Secondary | ICD-10-CM

## 2022-02-01 DIAGNOSIS — N183 Chronic kidney disease, stage 3 unspecified: Secondary | ICD-10-CM

## 2022-02-01 DIAGNOSIS — C220 Liver cell carcinoma: Secondary | ICD-10-CM

## 2022-02-01 DIAGNOSIS — K7031 Alcoholic cirrhosis of liver with ascites: Secondary | ICD-10-CM | POA: Diagnosis not present

## 2022-02-01 DIAGNOSIS — K219 Gastro-esophageal reflux disease without esophagitis: Secondary | ICD-10-CM | POA: Diagnosis not present

## 2022-02-01 DIAGNOSIS — E1142 Type 2 diabetes mellitus with diabetic polyneuropathy: Secondary | ICD-10-CM

## 2022-02-01 DIAGNOSIS — I129 Hypertensive chronic kidney disease with stage 1 through stage 4 chronic kidney disease, or unspecified chronic kidney disease: Secondary | ICD-10-CM | POA: Diagnosis not present

## 2022-02-01 DIAGNOSIS — J849 Interstitial pulmonary disease, unspecified: Secondary | ICD-10-CM

## 2022-02-01 LAB — CBC WITH DIFFERENTIAL/PLATELET
Basophils Absolute: 0 10*3/uL (ref 0.0–0.1)
Basophils Relative: 0.7 % (ref 0.0–3.0)
Eosinophils Absolute: 0.1 10*3/uL (ref 0.0–0.7)
Eosinophils Relative: 1.3 % (ref 0.0–5.0)
HCT: 27.9 % — ABNORMAL LOW (ref 39.0–52.0)
Hemoglobin: 8.8 g/dL — ABNORMAL LOW (ref 13.0–17.0)
Lymphocytes Relative: 14 % (ref 12.0–46.0)
Lymphs Abs: 0.8 10*3/uL (ref 0.7–4.0)
MCHC: 31.6 g/dL (ref 30.0–36.0)
MCV: 97.3 fl (ref 78.0–100.0)
Monocytes Absolute: 0.3 10*3/uL (ref 0.1–1.0)
Monocytes Relative: 6 % (ref 3.0–12.0)
Neutro Abs: 4.3 10*3/uL (ref 1.4–7.7)
Neutrophils Relative %: 78 % — ABNORMAL HIGH (ref 43.0–77.0)
Platelets: 114 10*3/uL — ABNORMAL LOW (ref 150.0–400.0)
RBC: 2.87 Mil/uL — ABNORMAL LOW (ref 4.22–5.81)
RDW: 21.1 % — ABNORMAL HIGH (ref 11.5–15.5)
WBC: 5.5 10*3/uL (ref 4.0–10.5)

## 2022-02-01 LAB — COMPREHENSIVE METABOLIC PANEL
ALT: 13 U/L (ref 0–53)
AST: 23 U/L (ref 0–37)
Albumin: 3.1 g/dL — ABNORMAL LOW (ref 3.5–5.2)
Alkaline Phosphatase: 102 U/L (ref 39–117)
BUN: 31 mg/dL — ABNORMAL HIGH (ref 6–23)
CO2: 25 mEq/L (ref 19–32)
Calcium: 8.5 mg/dL (ref 8.4–10.5)
Chloride: 108 mEq/L (ref 96–112)
Creatinine, Ser: 1.38 mg/dL (ref 0.40–1.50)
GFR: 48.13 mL/min — ABNORMAL LOW (ref >60.00)
Glucose, Bld: 136 mg/dL — ABNORMAL HIGH (ref 70–99)
Potassium: 5.7 mEq/L — ABNORMAL HIGH (ref 3.5–5.1)
Sodium: 140 mEq/L (ref 135–145)
Total Bilirubin: 0.6 mg/dL (ref 0.2–1.2)
Total Protein: 5.4 g/dL — ABNORMAL LOW (ref 6.0–8.3)

## 2022-02-01 MED ORDER — CARVEDILOL 6.25 MG PO TABS: 6.2500 mg | ORAL_TABLET | Freq: Two times a day (BID) | ORAL | 3 refills | Status: DC

## 2022-02-01 MED ORDER — PANTOPRAZOLE SODIUM 40 MG PO TBEC: 40.0000 mg | DELAYED_RELEASE_TABLET | Freq: Two times a day (BID) | ORAL | 3 refills | Status: DC

## 2022-02-01 MED ORDER — OXYCODONE-ACETAMINOPHEN 10-325 MG PO TABS: 1.0000 | ORAL_TABLET | Freq: Three times a day (TID) | ORAL | 0 refills | Status: DC | PRN

## 2022-02-01 NOTE — Patient Instructions (Addendum)
Have diabetic eye exam scheduled and report sent to [place] at [phone]. ? ?Bring all medicines to each visit so we can review together ? ?Not sure if you are still on gabapentin or not- please check and let me know ? ?Please stop by lab before you go ?If you have mychart- we will send your results within 3 business days of [place] receiving them.  ?If you do not have mychart- we will call you about results within 5 business days of [place] receiving them.  ?*please also note that you will see labs on mychart as soon as they post. I will later go in and write notes on them- will say "notes from Dr. Yong Channel"  ? ?IF you have dark black stool again Dr. Hilarie Fredrickson recommends go to emergency room.  ?-restart pantoprazole 40 mg twice daily- new rx sent in under my name ?- this is to prevent upper abdomen bleeds ?-hold aspirin for now ? ?Check a1c next visit likely point of care- significant anemia plus transfusion may affect this so opted to hold off ? ?4-6 week follow up needed- schedule before you leave ? ? ?

## 2022-02-01 NOTE — Progress Notes (Signed)
Message re: melena in setting of ibuprofen, cirrhosis and HCC noted. ?Sorry to hear. ?Agree with ER rec which he refused last week. ?I see he got 2 units pRBCs ?He is seeing Dr. Yong Channel today. ?I have discussed with GI colleagues, [facility] staff and [facility] Endo team. ? ?Plan: ?1. Restart PPI, pantoprazole 40 mg BID-AC in setting of melena ?2. If ongoing active melena then he should go to ER emergently ?3. EGD Thursday, April 27, 12:30 PM with Dr. Silverio Decamp at [facility] ?4. I asked Dr. Yong Channel to repeat CBC today while at PCP visit ? ?Thanks to all involved for care and help. ?JMP ? ?

## 2022-02-01 NOTE — Telephone Encounter (Signed)
Pt scheduled for EGD at [facility] 02/03/22 at 12:30pm, pt to arrive there at 11am. Pt to be NPO after midnight. Amb referral and orders in epic. Case #[id number]. ?

## 2022-02-02 ENCOUNTER — Other Ambulatory Visit: Payer: Self-pay | Admitting: *Deleted

## 2022-02-02 ENCOUNTER — Ambulatory Visit (HOSPITAL_COMMUNITY)
Admission: RE | Admit: 2022-02-02 | Discharge: 2022-02-02 | Disposition: A | Discharge status: Home / Self Care | Payer: Medicare HMO | Source: Ambulatory Visit | Attending: Internal Medicine | Admitting: Internal Medicine

## 2022-02-02 DIAGNOSIS — K7460 Unspecified cirrhosis of liver: Secondary | ICD-10-CM | POA: Diagnosis not present

## 2022-02-02 DIAGNOSIS — K7031 Alcoholic cirrhosis of liver with ascites: Secondary | ICD-10-CM | POA: Insufficient documentation

## 2022-02-02 DIAGNOSIS — N186 End stage renal disease: Secondary | ICD-10-CM | POA: Diagnosis not present

## 2022-02-02 DIAGNOSIS — R188 Other ascites: Secondary | ICD-10-CM | POA: Diagnosis not present

## 2022-02-02 DIAGNOSIS — E875 Hyperkalemia: Secondary | ICD-10-CM

## 2022-02-02 HISTORY — PX: IR PARACENTESIS: IMG2679

## 2022-02-02 LAB — BODY FLUID CELL COUNT WITH DIFFERENTIAL
Eos, Fluid: 0 %
Lymphs, Fluid: 46 %
Monocyte-Macrophage-Serous Fluid: 50 % (ref 50–90)
Neutrophil Count, Fluid: 4 % (ref 0–25)
Total Nucleated Cell Count, Fluid: 109 cu mm (ref 0–1000)

## 2022-02-02 LAB — GRAM STAIN

## 2022-02-02 MED ORDER — LIDOCAINE HCL (PF) 1 % IJ SOLN
INTRAMUSCULAR | Status: DC | PRN
  Administered 2022-02-02: 10 mL

## 2022-02-02 MED ORDER — ALBUMIN HUMAN 25 % IV SOLN
INTRAVENOUS | Status: AC
  Filled 2022-02-02: qty 200

## 2022-02-02 MED ORDER — ALBUMIN HUMAN 25 % IV SOLN
50.0000 g | Freq: Once | INTRAVENOUS | Status: AC
Start: 2022-02-02 — End: 2022-02-02
  Administered 2022-02-02: 50 g via INTRAVENOUS
  Filled 2022-02-02: qty 200

## 2022-02-02 MED ORDER — LIDOCAINE HCL 1 % IJ SOLN
INTRAMUSCULAR | Status: AC
  Filled 2022-02-02: qty 20

## 2022-02-02 NOTE — Procedures (Signed)
PROCEDURE SUMMARY: ? ?Successful US guided paracentesis from right lateral abdomen.  ?Yielded 7.5 liters of clear yellow fluid.  ?No immediate complications.  ?Patient tolerated well.  ?EBL = trace ? ?Murrell Redden PA-C ?02/02/2022 ?2:20 PM ? ? ? ?

## 2022-02-02 NOTE — Telephone Encounter (Signed)
Spoke with pt and he is aware of appt tomorrow at 12:30pm at [facility] for EGD with Dr. Silverio Decamp. Pt knows to arrive there at 11am and to be NPO after midnight. ?

## 2022-02-02 NOTE — Progress Notes (Signed)
? ?  Portal Hypertension Clinic Screening Evaluation ? ? ?Indication for evaluation: ?[name] is a 81 y.o. male undergoing preliminary evaluation in the [facility] Interventional Radiology Portal Hypertension Clinic due to recurrent ascites. ? ?Referring Physician/Established Gastroenterologist: Dr. Zenovia Jarred ? 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 ?Prior evaluation for liver transplant: No ?History of hepatocellular carcinoma: Yes, status post SBRT to L and R lobe masses (2021-2022) ? ?Prior esophagogastroduodenoscopy/intervention: 07/14/21 - grade I esophageal varices, portal gastropathy ?Current esophageal varices: Yes ?Current gastric varices: No ?History of hematemesis: No ? ?Current diuretic regimen: None ?Current pharmacologic encephalopathy prophylaxis/treatment: None ? ?History of renal dysfunction: Yes ?History of hemodialysis: No ? ?History of cardiac dysfunction: HFrecEF ? ?Other pertinent past medical history: COPD, HTN, HLD, thrombocytopenia, SBP, type 2 diabetes, BPH ? ? ?Imaging: ?Prior cross sectional imaging of portal system: ?CT AP 07/12/21 ? ?Widely patent portal system, anatomy amenable for TIPS.  No significant varices. ? ?Echocardiogram:   ?10/06/21 ? ? ? ?Labs: ?Creatinine: 1.38 ?Total Bilirubin: 0.3 ?INR: 1.0 ?Sodium: 140 ?Albumin: 3.1 ? ?Child-Pugh = 8 points, class B ?MELD = 10 (6% estimated 3 month mortality) ?[place] Index of Post-TIPS Survival (FIPS) = 0.38 (Overall survival estimated at 93.5% at one month, 81.0% at 3 months, and 73.5% at 6 months) ? ? ? ?Assessment: ?[name] is a 81 y.o. male with history of EtOH/NASH cirrhosis (Child Pugh B, MELD 10) and unifocal, bilobar hepatocellular carcinoma status post radiation therapy with recurrent ascites.  After preliminary evaluation, this  patient would be a potential candidate for TIPS creation, specifically if renal function prohibits diuretic usage to manage recurrent ascites.  Jagual is not an absolute contraindication for TIPS creation.  Cardiac function and COPD may require further workup if pursued. ? ?Recommendation: ?Formal consult for TIPS could be considered.  The patient's Gastroenterologist, Dr. Zenovia Jarred, will be contacted for further discussion. ? ? ? ?Electronically Signed: ?Suzette Battiest, MD ?02/02/2022, 3:15 PM ? ? ? ?

## 2022-02-03 ENCOUNTER — Ambulatory Visit (HOSPITAL_BASED_OUTPATIENT_CLINIC_OR_DEPARTMENT_OTHER): Payer: Medicare HMO | Admitting: Certified Registered Nurse Anesthetist

## 2022-02-03 ENCOUNTER — Encounter (HOSPITAL_COMMUNITY): Payer: Self-pay | Admitting: Gastroenterology

## 2022-02-03 ENCOUNTER — Other Ambulatory Visit: Payer: Self-pay

## 2022-02-03 ENCOUNTER — Ambulatory Visit (HOSPITAL_COMMUNITY)
Admission: RE | Admit: 2022-02-03 | Discharge: 2022-02-03 | Disposition: A | Discharge status: Home / Self Care | Payer: Medicare HMO | Source: Ambulatory Visit | Attending: Gastroenterology | Admitting: Gastroenterology

## 2022-02-03 ENCOUNTER — Other Ambulatory Visit: Payer: Self-pay | Admitting: Interventional Radiology

## 2022-02-03 ENCOUNTER — Telehealth: Payer: Self-pay

## 2022-02-03 ENCOUNTER — Ambulatory Visit (HOSPITAL_COMMUNITY): Payer: Medicare HMO | Admitting: Certified Registered Nurse Anesthetist

## 2022-02-03 ENCOUNTER — Encounter: Payer: Self-pay | Admitting: Student

## 2022-02-03 ENCOUNTER — Encounter (HOSPITAL_COMMUNITY)
Admission: RE | Disposition: A | Discharge status: Home / Self Care | Payer: Self-pay | Source: Ambulatory Visit | Attending: Gastroenterology

## 2022-02-03 DIAGNOSIS — K254 Chronic or unspecified gastric ulcer with hemorrhage: Secondary | ICD-10-CM

## 2022-02-03 DIAGNOSIS — Z87891 Personal history of nicotine dependence: Secondary | ICD-10-CM | POA: Insufficient documentation

## 2022-02-03 DIAGNOSIS — K766 Portal hypertension: Secondary | ICD-10-CM

## 2022-02-03 DIAGNOSIS — I110 Hypertensive heart disease with heart failure: Secondary | ICD-10-CM | POA: Insufficient documentation

## 2022-02-03 DIAGNOSIS — I868 Varicose veins of other specified sites: Secondary | ICD-10-CM

## 2022-02-03 DIAGNOSIS — Z79899 Other long term (current) drug therapy: Secondary | ICD-10-CM | POA: Diagnosis not present

## 2022-02-03 DIAGNOSIS — I8501 Esophageal varices with bleeding: Secondary | ICD-10-CM | POA: Diagnosis not present

## 2022-02-03 DIAGNOSIS — E119 Type 2 diabetes mellitus without complications: Secondary | ICD-10-CM | POA: Insufficient documentation

## 2022-02-03 DIAGNOSIS — J449 Chronic obstructive pulmonary disease, unspecified: Secondary | ICD-10-CM | POA: Diagnosis not present

## 2022-02-03 DIAGNOSIS — I509 Heart failure, unspecified: Secondary | ICD-10-CM | POA: Insufficient documentation

## 2022-02-03 DIAGNOSIS — I8510 Secondary esophageal varices without bleeding: Secondary | ICD-10-CM | POA: Diagnosis not present

## 2022-02-03 DIAGNOSIS — K3189 Other diseases of stomach and duodenum: Secondary | ICD-10-CM | POA: Insufficient documentation

## 2022-02-03 DIAGNOSIS — K2951 Unspecified chronic gastritis with bleeding: Secondary | ICD-10-CM | POA: Diagnosis not present

## 2022-02-03 DIAGNOSIS — D62 Acute posthemorrhagic anemia: Secondary | ICD-10-CM | POA: Diagnosis not present

## 2022-02-03 DIAGNOSIS — K2950 Unspecified chronic gastritis without bleeding: Secondary | ICD-10-CM | POA: Insufficient documentation

## 2022-02-03 DIAGNOSIS — K219 Gastro-esophageal reflux disease without esophagitis: Secondary | ICD-10-CM | POA: Diagnosis not present

## 2022-02-03 DIAGNOSIS — K921 Melena: Secondary | ICD-10-CM

## 2022-02-03 DIAGNOSIS — Z7984 Long term (current) use of oral hypoglycemic drugs: Secondary | ICD-10-CM | POA: Insufficient documentation

## 2022-02-03 DIAGNOSIS — F418 Other specified anxiety disorders: Secondary | ICD-10-CM | POA: Diagnosis not present

## 2022-02-03 HISTORY — PX: BIOPSY: SHX5522

## 2022-02-03 HISTORY — PX: ESOPHAGOGASTRODUODENOSCOPY (EGD) WITH PROPOFOL: SHX5813

## 2022-02-03 HISTORY — PX: HEMOSTASIS CLIP PLACEMENT: SHX6857

## 2022-02-03 LAB — GLUCOSE, CAPILLARY: Glucose-Capillary: 106 mg/dL — ABNORMAL HIGH (ref 70–99)

## 2022-02-03 LAB — PATHOLOGIST SMEAR REVIEW

## 2022-02-03 SURGERY — ESOPHAGOGASTRODUODENOSCOPY (EGD) WITH PROPOFOL
Anesthesia: Monitor Anesthesia Care

## 2022-02-03 MED ORDER — SUCRALFATE 1 G PO TABS: 1.0000 g | ORAL_TABLET | Freq: Four times a day (QID) | ORAL | 1 refills | Status: DC

## 2022-02-03 MED ORDER — LACTATED RINGERS IV SOLN
INTRAVENOUS | Status: DC
  Administered 2022-02-03: 1000 mL via INTRAVENOUS

## 2022-02-03 MED ORDER — PROPOFOL 500 MG/50ML IV EMUL
INTRAVENOUS | Status: DC | PRN
  Administered 2022-02-03: 150 ug/kg/min via INTRAVENOUS

## 2022-02-03 MED ORDER — SODIUM CHLORIDE 0.9 % IV SOLN: INTRAVENOUS | Status: DC

## 2022-02-03 SURGICAL SUPPLY — 15 items

## 2022-02-03 NOTE — Op Note (Addendum)
[facility] ?Patient Name: [name] ?Procedure Date: 02/03/2022 ?MRN: [id number] ?Attending MD: Mauri Pole , MD ?Date of Birth: 08/23/1941 ?CSN: [id number] ?Age: 81 ?Admit Type: Outpatient ?Procedure:                Upper GI endoscopy ?Indications:              Recent gastrointestinal bleeding, Acute post  ?                          hemorrhagic anemia, Melena ?Providers:                Mauri Pole, MD, Doristine Johns, RN, Clae  ?                          Psychologist, counselling, [employer] ?Referring MD:              ?Medicines:                Monitored Anesthesia Care ?Complications:            No immediate complications. ?Estimated Blood Loss:     Estimated blood loss was minimal. ?Procedure:                Pre-Anesthesia Assessment: ?                          - Prior to the procedure, a History and Physical  ?                          was performed, and patient medications and  ?                          allergies were reviewed. The patient's tolerance of  ?                          previous anesthesia was also reviewed. The risks  ?                          and benefits of the procedure and the sedation  ?                          options and risks were discussed with the patient.  ?                          All questions were answered, and informed consent  ?                          was obtained. Prior Anticoagulants: The patient has  ?                          taken no previous anticoagulant or antiplatelet  ?                          agents. ASA Grade Assessment: IV - A patient with  ?  severe systemic disease that is a constant threat  ?                          to life. After reviewing the risks and benefits,  ?                          the patient was deemed in satisfactory condition to  ?                          undergo the procedure. ?                          After obtaining informed consent, the endoscope was  ?                          passed  under direct vision. Throughout the  ?                          procedure, the patient's blood pressure, pulse, and  ?                          oxygen saturations were monitored continuously. The  ?                          GIF-H190 ([id number] Olympus endoscope was introduced  ?                          through the mouth, and advanced to the second part  ?                          of duodenum. The upper GI endoscopy was  ?                          accomplished without difficulty. The patient  ?                          tolerated the procedure well. ?Scope In: ?Scope Out: ?Findings: ?     Grade I varices were found in the middle third of the esophagus and in  ?     the lower third of the esophagus. They were less than 5 mm in largest  ?     diameter. ?     Moderate portal hypertensive gastropathy was found in the entire  ?     examined stomach. ?     One non-bleeding cratered gastric ulcer with no stigmata of bleeding was  ?     found in the gastric antrum. The lesion was 10 mm in largest dimension.  ?     Biopsies were taken with a cold forceps for histology. ?     A grade III varix was found in the first portion of the duodenum with  ?     superficial mucosal ulceration with stigmata of recent bleeding. It was  ?     20 mm in diameter. For location marking, one hemostatic clip was  ?     successfully placed (MR conditional). There was no bleeding during, or  ?  at the end, of the procedure. ?Impression:               - Grade I esophageal varices. ?                          - Portal hypertensive gastropathy. ?                          - Non-bleeding gastric ulcer with no stigmata of  ?                          bleeding. Biopsied. ?                          - Grade III duodenal varices. Clip (MR conditional)  ?                          was placed. ?Moderate Sedation: ?     N/A ?Recommendation:           - Resume previous diet. ?                          - Continue present medications. ?                          -  No aspirin, ibuprofen, naproxen, or other  ?                          non-steroidal anti-inflammatory drugs. ?                          - Use Protonix (pantoprazole) 40 mg PO BID for 3  ?                          months. ?                          - Use sucralfate tablets 1 gram PO QID for 1 month. ?                          - Schedule urgent CT BRTO protocol and IR consult  ?                          for possible embolization of duodenal varix ?                          - Follow up in GI office in 1-2 weeks with Dr  ?                          Hilarie Fredrickson or APP ?Procedure Code(s):        --- Professional --- ?                          [phone], Esophagogastroduodenoscopy, flexible,  ?  transoral; with biopsy, single or multiple ?                          [phone], Unlisted procedure, small intestine ?Diagnosis Code(s):        --- Professional --- ?                          I85.00, Esophageal varices without bleeding ?                          K76.6, Portal hypertension ?                          K31.89, Other diseases of stomach and duodenum ?                          K25.9, Gastric ulcer, unspecified as acute or  ?                          chronic, without hemorrhage or perforation ?                          I86.8, Varicose veins of other specified sites ?                          K92.2, Gastrointestinal hemorrhage, unspecified ?                          D62, Acute posthemorrhagic anemia ?                          K92.1, Melena (includes Hematochezia) ?CPT copyright 2019 American Medical Association. All rights reserved. ?The codes documented in this report are preliminary and upon coder review may  ?be revised to meet current compliance requirements. ?Mauri Pole, MD ?02/03/2022 1:35:24 PM ?This report has been signed electronically. ?Number of Addenda: 0 ?

## 2022-02-03 NOTE — Telephone Encounter (Signed)
Pt aware of appt tomorrow and knows to be there at 9:30am for a 10am appt. He knows to be NPO after midnight. ?

## 2022-02-03 NOTE — Transfer of Care (Signed)
Immediate Anesthesia Transfer of Care Note ? ?Patient: [name] ? ?Procedure(s) Performed: ESOPHAGOGASTRODUODENOSCOPY (EGD) WITH PROPOFOL ?HEMOSTASIS CLIP PLACEMENT ?BIOPSY ? ?Patient Location: PACU ? ?Anesthesia Type:MAC ? ?Level of Consciousness: sedated, patient cooperative and responds to stimulation ? ?Airway & Oxygen Therapy: Patient Spontanous Breathing and Patient connected to face mask oxygen ? ?Post-op Assessment: Report given to RN and Post -op Vital signs reviewed and stable ? ?Post vital signs: Reviewed and stable ? ?Last Vitals:  ?Vitals Value Taken Time  ?BP 90/46 02/03/22 1327  ?Temp 36.9 ?C 02/03/22 1327  ?Pulse 93 02/03/22 1327  ?Resp 16 02/03/22 1327  ?SpO2 100 % 02/03/22 1327  ?Vitals shown include unvalidated device data. ? ?Last Pain:  ?Vitals:  ? 02/03/22 1327  ?TempSrc: Tympanic  ?PainSc: Asleep  ?   ? ?  ? ?Complications: No notable events documented. ?

## 2022-02-03 NOTE — Telephone Encounter (Signed)
Pt scheduled for CTA A/P with BRTO at [facility] 02/04/22 at 10am, pt to arrive there at 9:30am. Needs to go through entrance A and check in with admitting to get to radiology dept. He is to be NPO after midnight.  ? ?Order in for IR eval for embolization of duodenal varix. Radiology is working on this and are trying to get him scheduled for Wed.  ? ?Pt tentatively scheduled to see Dr. Hilarie Fredrickson 5/3 at 1:10pm. Appt may have to be moved if IR is seeing him Wednesday. ?

## 2022-02-03 NOTE — H&P (Signed)
[facility] History and Physical ? ? ?Primary Care Physician:  [name], MD ? ? ?Reason for Procedure:   Melena, anemia ? ?Plan:    EGD with possible intervention ? ? ? ? ?HPI: [name] is a 81 y.o. male with h/o cirrhosis, HCC s/p ablation here for further evaluation of melena and drop in Hgb ? ?The risks and benefits as well as alternatives of endoscopic procedure(s) have been discussed and reviewed. All questions answered. The patient agrees to proceed. ? ? ? ?Past Medical History:  ?Diagnosis Date  ? Benign prostatic hypertrophy   ? Blood transfusion without reported diagnosis   ? CHF (congestive heart failure) ([place])   ? a. EF 20-25% by echo in 11/2019  ? COPD (chronic obstructive pulmonary disease) ([place]) 12/15/2009  ? FEV1 2.30 (70%) ratio 63 no better with B2 and DLCO 18/6 (73%) corrects to 106%  ? Degenerative joint disease   ? Depression   ? Diverticulosis   ? Esophageal varices ([facility])   ? Essential hypertension   ? ACE inhibitor cough  ? Fatty liver   ? Hepatic cirrhosis ([place])   ? Hepatocellular carcinoma ([place])   ? Hyperlipidemia   ? IDA (iron deficiency anemia)   ? Internal hemorrhoids   ? Liver cancer ([place])   ? Low back pain   ? Otitis externa 07/30/2012  ? Portal hypertensive gastropathy ([facility])   ? SBP (spontaneous bacterial peritonitis) ([place])   ? Sepsis ([place])   ? Splenomegaly   ? Thrombocytopenia ([place])   ? Type 2 diabetes mellitus ([place])   ? Ulcer of foot ([place])   ? Right foot  ? ? ?Past Surgical History:  ?Procedure Laterality Date  ? BIOPSY  07/14/2021  ? Procedure: BIOPSY;  Surgeon: Daryel November, MD;  Location: [facility] ENDOSCOPY;  Service: Gastroenterology;;  EGD and COLON  ? COLONOSCOPY  2020  ? JMP-MAC-plenvu (good)-polyps-recall 1 yr  ? COLONOSCOPY WITH PROPOFOL N/A 07/14/2021  ? Procedure: COLONOSCOPY WITH PROPOFOL;  Surgeon: Daryel November, MD;  Location: [place];  Service: Gastroenterology;  Laterality: N/A;  ? ESOPHAGOGASTRODUODENOSCOPY (EGD) WITH PROPOFOL N/A  07/14/2021  ? Procedure: ESOPHAGOGASTRODUODENOSCOPY (EGD) WITH PROPOFOL;  Surgeon: Daryel November, MD;  Location: [place];  Service: Gastroenterology;  Laterality: N/A;  ? IR EMBO TUMOR ORGAN ISCHEMIA INFARCT INC GUIDE ROADMAPPING  06/01/2020  ? IR PARACENTESIS  08/30/2021  ? IR PARACENTESIS  09/22/2021  ? IR PARACENTESIS  10/12/2021  ? IR PARACENTESIS  11/02/2021  ? IR PARACENTESIS  11/15/2021  ? IR PARACENTESIS  11/30/2021  ? IR PARACENTESIS  12/14/2021  ? IR PARACENTESIS  12/27/2021  ? IR PARACENTESIS  01/04/2022  ? IR PARACENTESIS  01/12/2022  ? IR PARACENTESIS  01/19/2022  ? IR PARACENTESIS  02/02/2022  ? IR RADIOLOGIST EVAL & MGMT  04/28/2020  ? IR RADIOLOGIST EVAL & MGMT  07/15/2020  ? IR RADIOLOGIST EVAL & MGMT  05/04/2021  ? NO PAST SURGERIES  1980  ? ? ?Prior to Admission medications   ?Medication Sig Start Date End Date Taking? Authorizing Provider  ?albuterol (VENTOLIN HFA) 108 (90 Base) MCG/ACT inhaler Inhale 2 puffs into the lungs every 6 (six) hours as needed for wheezing or shortness of breath. 11/01/21  Yes [name], MD  ?busPIRone (BUSPAR) 7.5 MG tablet Take 1 tablet (7.5 mg total) by mouth 2 (two) times daily as needed. for anxiety ?Patient taking differently: Take 7.5 mg by mouth See admin instructions. Take 7.5 mg at bedtime, may  take a second 7.5 mg dose as needed for anxiety 05/14/21  Yes [name], MD  ?carvedilol (COREG) 6.25 MG tablet Take 1 tablet (6.25 mg total) by mouth 2 (two) times daily with a meal. 02/01/22  Yes [name], MD  ?escitalopram (LEXAPRO) 5 MG tablet Take 1 tablet (5 mg total) by mouth daily. 05/14/21  Yes [name], MD  ?finasteride (PROSCAR) 5 MG tablet TAKE 1 TABLET EVERY DAY 01/10/22  Yes [name], MD  ?gabapentin (NEURONTIN) 300 MG capsule TAKE 1 CAPSULE TWICE DAILY ?Patient taking differently: Take 300 mg by mouth 2 (two) times daily. 03/15/21  Yes [name], MD  ?oxyCODONE-acetaminophen (PERCOCET) 10-325 MG tablet Take 1 tablet by  mouth every 8 (eight) hours as needed for pain (chronic pain in cancer patient with cirrhosis). ?Patient taking differently: Take 0.5 tablets by mouth every 8 (eight) hours as needed for pain (chronic pain in cancer patient with cirrhosis). 02/01/22 03/03/22 Yes [name], MD  ?rosuvastatin (CRESTOR) 5 MG tablet TAKE 1 TABLET EVERY DAY 11/16/21  Yes [name], MD  ?tamsulosin (FLOMAX) 0.4 MG CAPS capsule TAKE 1 CAPSULE EVERY DAY ?Patient taking differently: Take 0.4 mg by mouth daily. 05/26/21  Yes [name], MD  ?pantoprazole (PROTONIX) 40 MG tablet Take 1 tablet (40 mg total) by mouth 2 (two) times daily. 02/01/22   [name], MD  ? ? ?Current Facility-Administered Medications  ?Medication Dose Route Frequency Provider Last Rate Last Admin  ? lactated ringers infusion   Intravenous Continuous Harl Bowie V, MD 20 mL/hr at 02/03/22 1217 1,000 mL at 02/03/22 1217  ? ? ?Allergies as of 02/01/2022  ? (No Known Allergies)  ? ? ?Family History  ?Problem Relation Age of Onset  ? Melanoma Mother   ? Stroke Father   ? CAD Brother   ?     Premature disease  ? Colon polyps Neg Hx   ? Colon cancer Neg Hx   ? Esophageal cancer Neg Hx   ? Stomach cancer Neg Hx   ? Rectal cancer Neg Hx   ? ? ?Social History  ? ?Socioeconomic History  ? Marital status: Unknown  ?  Spouse name: Not on file  ? Number of children: Not on file  ? Years of education: Not on file  ? Highest education level: Not on file  ?Occupational History  ? Occupation: retired  ?  Comment: maintenence work  ?Tobacco Use  ? Smoking status: Former  ?  Packs/day: 4.00  ?  Years: 20.00  ?  Pack years: 80.00  ?  Types: Cigarettes  ?  Quit date: 09/18/1984  ?  Years since quitting: 37.4  ? Smokeless tobacco: Never  ? Tobacco comments:  ?  quit in 1980  ?Vaping Use  ? Vaping Use: Never used  ?Substance and Sexual Activity  ? Alcohol use: No  ?  Comment: no drinking since 40 years plus  ? Drug use: No  ? Sexual activity: Yes  ?Other Topics  Concern  ? Not on file  ?Social History Narrative  ? ** Merged History Encounter **  ?    ? Married 53 years in 2015. 4 kids (2 sons) and 3 grandkids.   ? Lived in [place], [place] wholel life  ?   ? Retired from [employer]  ?   ? Hobbies-rest, going to beach where they have a camper  ?   ?   ?   ? ?  Social Determinants of Health  ? ?Financial Resource Strain: Low Risk   ? Difficulty of Paying Living Expenses: Not hard at all  ?Food Insecurity: No Food Insecurity  ? Worried About Charity fundraiser in the Last Year: Never true  ? Ran Out of Food in the Last Year: Never true  ?Transportation Needs: No Transportation Needs  ? Lack of Transportation (Medical): No  ? Lack of Transportation (Non-Medical): No  ?Physical Activity: Inactive  ? Days of Exercise per Week: 0 days  ? Minutes of Exercise per Session: 0 min  ?Stress: No Stress Concern Present  ? Feeling of Stress : Only a little  ?Social Connections: Moderately Integrated  ? Frequency of Communication with Friends and Family: More than three times a week  ? Frequency of Social Gatherings with Friends and Family: More than three times a week  ? Attends Religious Services: More than 4 times per year  ? Active Member of Clubs or Organizations: No  ? Attends Archivist Meetings: Never  ? Marital Status: Married  ?Intimate Partner Violence: Not At Risk  ? Fear of Current or Ex-Partner: No  ? Emotionally Abused: No  ? Physically Abused: No  ? Sexually Abused: No  ? ? ?Review of Systems: ? ?All other review of systems negative except as mentioned in the HPI. ? ?Physical Exam: ?Vital signs in last 24 hours: ?Temp:  [98.7 ?F (37.1 ?C)] 98.7 ?F (37.1 ?C) (04/27 1203) ?Pulse Rate:  [93] 93 (04/27 1203) ?BP: (99)/(59) 99/59 (04/27 1203) ?SpO2:  [96 %] 96 % (04/27 1203) ?Weight:  [77.1 kg] 77.1 kg (04/27 1203) ?  ?General:   Alert, NAD ?Lungs:  Clear .   ?Heart:  Regular rate and rhythm ?Abdomen:  Soft, nontender and mild distension ?Neuro/Psych:  Alert and  cooperative. Normal mood and affect. A and O x 3 ? ? ?K. Denzil Magnuson , MD ?[phone]  ? ? ?  ?

## 2022-02-03 NOTE — Progress Notes (Unsigned)
Patient ID: [name], male   DOB: 15-Apr-1941, 81 y.o.   MRN: [id number] ? ?IR contacted by Dr. Woodward Ku office regarding results of patient's endoscopy today showing Grade III duodenal varix with recent stigmata of bleeding (among other findings).  Localization clips placed by Dr. Silverio Decamp. Patient was encouraged to proceed with admission, however he has refused.  Recommendation from her office is to proceed with CT BRTO protocol with urgent outpatient evaluation in IR clinic for possible embolization vs intervention pending results of CT.  IR working to schedule accordingly.  ? ?Brynda Greathouse, MS RD PA-C ? ? ?

## 2022-02-03 NOTE — Anesthesia Postprocedure Evaluation (Signed)
Anesthesia Post Note ? ?Patient: [name] ? ?Procedure(s) Performed: ESOPHAGOGASTRODUODENOSCOPY (EGD) WITH PROPOFOL ?HEMOSTASIS CLIP PLACEMENT ?BIOPSY ? ?  ? ?Patient location during evaluation: PACU ?Anesthesia Type: MAC ?Level of consciousness: awake and alert ?Pain management: pain level controlled ?Vital Signs Assessment: post-procedure vital signs reviewed and stable ?Respiratory status: spontaneous breathing, nonlabored ventilation, respiratory function stable and patient connected to nasal cannula oxygen ?Cardiovascular status: stable and blood pressure returned to baseline ?Postop Assessment: no apparent nausea or vomiting ?Anesthetic complications: no ? ? ?No notable events documented. ? ?Last Vitals:  ?Vitals:  ? 02/03/22 1347 02/03/22 1357  ?BP: (!) 108/55 106/62  ?Pulse: 79 90  ?Resp: (!) 21 15  ?Temp:    ?SpO2: 96% 96%  ?  ?Last Pain:  ?Vitals:  ? 02/03/22 1357  ?TempSrc:   ?PainSc: 0-No pain  ? ? ?  ?  ?  ?  ?  ?  ? ?Mairead Schwarzkopf ? ? ? ? ?

## 2022-02-03 NOTE — Anesthesia Preprocedure Evaluation (Signed)
Anesthesia Evaluation  ?Patient identified by MRN, date of birth, ID band ?Patient awake ? ? ? ?Reviewed: ?Allergy & Precautions, NPO status , Patient's Chart, lab work & pertinent test results, reviewed documented beta blocker date and time  ? ?History of Anesthesia Complications ?Negative for: history of anesthetic complications ? ?Airway ?Mallampati: III ? ?TM Distance: >3 FB ?Neck ROM: Full ? ? ? Dental ? ?(+) Edentulous Upper, Edentulous Lower ?  ?Pulmonary ?COPD, former smoker,  ?  ?Pulmonary exam normal ? ? ? ? ? ? ? Cardiovascular ?hypertension, Pt. on home beta blockers and Pt. on medications ?+CHF  ?Normal cardiovascular exam ? ?TTE 06/2020: EF 50-55%, mild LVH, grade I DD, severe LAE, mild to moderate AS ?  ?Neuro/Psych ?Anxiety Depression negative neurological ROS ?   ? GI/Hepatic ?GERD  Medicated and Controlled,(+) Cirrhosis  ?  ?  ? ,   ?Endo/Other  ?diabetes, Type 2, Oral Hypoglycemic Agents ? Renal/GU ?Renal InsufficiencyRenal disease  ?negative genitourinary ?  ?Musculoskeletal ? ?(+) Arthritis ,  ? Abdominal ?  ?Peds ? Hematology ? ?(+) Blood dyscrasia, anemia , Hgb 9.7, plt 81k   ?Anesthesia Other Findings ?Day of surgery medications reviewed with patient. ? Reproductive/Obstetrics ?negative OB ROS ? ?  ? ? ? ? ? ? ? ? ? ? ? ? ? ?  ?  ? ? ? ? ? ? ? ? ?Anesthesia Physical ? ?Anesthesia Plan ? ?ASA: 3 ? ?Anesthesia Plan: MAC  ? ?Post-op Pain Management: Minimal or no pain anticipated  ? ?Induction: Intravenous ? ?PONV Risk Score and Plan: 1 and Treatment may vary due to age or medical condition and Propofol infusion ? ?Airway Management Planned: Natural Airway and Nasal Cannula ? ?Additional Equipment: None ? ?Intra-op Plan:  ? ?Post-operative Plan:  ? ?Informed Consent: I have reviewed the patients History and Physical, chart, labs and discussed the procedure including the risks, benefits and alternatives for the proposed anesthesia with the patient or authorized  representative who has indicated his/her understanding and acceptance.  ? ? ? ? ? ?Plan Discussed with: CRNA and Anesthesiologist ? ?Anesthesia Plan Comments:   ? ? ? ? ? ? ?Anesthesia Quick Evaluation ? ?

## 2022-02-03 NOTE — Discharge Instructions (Signed)
YOU HAD AN ENDOSCOPIC PROCEDURE TODAY: Refer to the procedure report and other information in the discharge instructions given to you for any specific questions about what was found during the examination. If this information does not answer your questions, please call [place] office at [phone] to clarify.  ° °YOU SHOULD EXPECT: Some feelings of bloating in the abdomen. Passage of more gas than usual. Walking can help get rid of the air that was put into your GI tract during the procedure and reduce the bloating. If you had a lower endoscopy (such as a colonoscopy or flexible sigmoidoscopy) you may notice spotting of blood in your stool or on the toilet paper. Some abdominal soreness may be present for a day or two, also. ° °DIET: Your first meal following the procedure should be a light meal and then it is ok to progress to your normal diet. A half-sandwich or bowl of soup is an example of a good first meal. Heavy or fried foods are harder to digest and may make you feel nauseous or bloated. Drink plenty of fluids but you should avoid alcoholic beverages for 24 hours. If you had a esophageal dilation, please see attached instructions for diet.   ° °ACTIVITY: Your care partner should take you home directly after the procedure. You should plan to take it easy, moving slowly for the rest of the day. You can resume normal activity the day after the procedure however YOU SHOULD NOT DRIVE, use power tools, machinery or perform tasks that involve climbing or major physical exertion for 24 hours (because of the sedation medicines used during the test).  ° °SYMPTOMS TO REPORT IMMEDIATELY: °A gastroenterologist can be reached at any hour. Please call [phone]  for any of the following symptoms:  °Following lower endoscopy (colonoscopy, flexible sigmoidoscopy) °Excessive amounts of blood in the stool  °Significant tenderness, worsening of abdominal pains  °Swelling of the abdomen that is new, acute  °Fever of 100° or  higher  °Following upper endoscopy (EGD, EUS, ERCP, esophageal dilation) °Vomiting of blood or coffee ground material  °New, significant abdominal pain  °New, significant chest pain or pain under the shoulder blades  °Painful or persistently difficult swallowing  °New shortness of breath  °Black, tarry-looking or red, bloody stools ° °FOLLOW UP:  °If any biopsies were taken you will be contacted by phone or by letter within the next 1-3 weeks. Call [phone]  if you have not heard about the biopsies in 3 weeks.  °Please also call with any specific questions about appointments or follow up tests. ° °

## 2022-02-04 ENCOUNTER — Ambulatory Visit: Payer: Medicare HMO | Admitting: Family Medicine

## 2022-02-04 ENCOUNTER — Ambulatory Visit (HOSPITAL_COMMUNITY)
Admission: RE | Admit: 2022-02-04 | Discharge: 2022-02-04 | Disposition: A | Discharge status: Home / Self Care | Payer: Medicare HMO | Source: Ambulatory Visit | Attending: Gastroenterology | Admitting: Gastroenterology

## 2022-02-04 ENCOUNTER — Encounter (HOSPITAL_COMMUNITY): Payer: Self-pay | Admitting: Gastroenterology

## 2022-02-04 DIAGNOSIS — I868 Varicose veins of other specified sites: Secondary | ICD-10-CM | POA: Diagnosis not present

## 2022-02-04 DIAGNOSIS — D649 Anemia, unspecified: Secondary | ICD-10-CM | POA: Diagnosis not present

## 2022-02-04 DIAGNOSIS — K921 Melena: Secondary | ICD-10-CM | POA: Diagnosis not present

## 2022-02-04 MED ORDER — IOHEXOL 350 MG/ML SOLN
100.0000 mL | Freq: Once | INTRAVENOUS | Status: AC | PRN
  Administered 2022-02-04: 100 mL via INTRAVENOUS

## 2022-02-06 DIAGNOSIS — I10 Essential (primary) hypertension: Secondary | ICD-10-CM

## 2022-02-06 DIAGNOSIS — E1159 Type 2 diabetes mellitus with other circulatory complications: Secondary | ICD-10-CM

## 2022-02-07 ENCOUNTER — Ambulatory Visit (INDEPENDENT_AMBULATORY_CARE_PROVIDER_SITE_OTHER): Payer: Medicare HMO | Admitting: Cardiology

## 2022-02-07 ENCOUNTER — Encounter: Payer: Self-pay | Admitting: Cardiology

## 2022-02-07 VITALS — BP 100/52 | HR 110 | Ht 71.0 in | Wt 176.6 lb

## 2022-02-07 DIAGNOSIS — I350 Nonrheumatic aortic (valve) stenosis: Secondary | ICD-10-CM | POA: Diagnosis not present

## 2022-02-07 DIAGNOSIS — Z8679 Personal history of other diseases of the circulatory system: Secondary | ICD-10-CM | POA: Diagnosis not present

## 2022-02-07 LAB — CULTURE, BODY FLUID W GRAM STAIN -BOTTLE: Culture: NO GROWTH

## 2022-02-07 LAB — SURGICAL PATHOLOGY

## 2022-02-07 NOTE — Progress Notes (Signed)
? ? ?Cardiology Office Note ? ?Date: 02/07/2022  ? ?ID: [name], DOB January 21, 1941, MRN [id number] ? ?PCP:  [name], MD  ?Cardiologist:  Rozann Lesches, MD ?Electrophysiologist:  None  ? ?Chief Complaint  ?Patient presents with  ? Cardiac follow-up  ? ? ?History of Present Illness: ?[name] is an 81 y.o. male last seen in February.  He is here with family member today for a follow-up visit.  I reviewed the interval chart, medically complex situation with hepatocellular carcinoma and recurring ascites, GI bleed, also hyperkalemia. Recent GI work-up noted including findings of portal hypertensive gastropathy, grade 1 esophageal varices, gastric ulcers, and grade 3 duodenal varix. He has also had to come off of both Entresto and Aldactone.  He has been on Lokelma per PCP. ? ?We did go over his medications and I clarified that he should only be taking Coreg and Crestor at this time from a cardiac perspective.  He had a large volume paracentesis recently, already regaining fluid weight. ? ?I did review his echocardiogram from December 2022. ? ?Past Medical History:  ?Diagnosis Date  ? Aortic atherosclerosis ([place])   ? Benign prostatic hypertrophy   ? Blood transfusion without reported diagnosis   ? CHF (congestive heart failure) ([place])   ? a. EF 20-25% by echo in 11/2019  ? COPD (chronic obstructive pulmonary disease) ([place]) 12/15/2009  ? FEV1 2.30 (70%) ratio 63 no better with B2 and DLCO 18/6 (73%) corrects to 106%  ? Degenerative joint disease   ? Depression   ? Diverticulosis   ? Duodenal varices   ? Esophageal varices (HCC)   ? Essential hypertension   ? ACE inhibitor cough  ? Fatty liver   ? Gastric ulcer   ? Hepatic cirrhosis ([place])   ? Hepatocellular carcinoma ([place])   ? Hyperlipidemia   ? IDA (iron deficiency anemia)   ? Internal hemorrhoids   ? Liver cancer ([place])   ? Low back pain   ? Otitis externa 07/30/2012  ? Portal hypertensive gastropathy (HCC)   ? SBP (spontaneous bacterial peritonitis)  ([place])   ? Sepsis ([place])   ? Splenomegaly   ? Thrombocytopenia ([place])   ? Type 2 diabetes mellitus ([place])   ? Ulcer of foot ([place])   ? Right foot  ? ? ?Past Surgical History:  ?Procedure Laterality Date  ? BIOPSY  07/14/2021  ? Procedure: BIOPSY;  Surgeon: Daryel November, MD;  Location: [facility] ENDOSCOPY;  Service: Gastroenterology;;  EGD and COLON  ? BIOPSY  02/03/2022  ? Procedure: BIOPSY;  Surgeon: Mauri Pole, MD;  Location: [facility] ENDOSCOPY;  Service: Gastroenterology;;  ? COLONOSCOPY  2020  ? JMP-MAC-plenvu (good)-polyps-recall 1 yr  ? COLONOSCOPY WITH PROPOFOL N/A 07/14/2021  ? Procedure: COLONOSCOPY WITH PROPOFOL;  Surgeon: Daryel November, MD;  Location: [place];  Service: Gastroenterology;  Laterality: N/A;  ? ESOPHAGOGASTRODUODENOSCOPY (EGD) WITH PROPOFOL N/A 07/14/2021  ? Procedure: ESOPHAGOGASTRODUODENOSCOPY (EGD) WITH PROPOFOL;  Surgeon: Daryel November, MD;  Location: [place];  Service: Gastroenterology;  Laterality: N/A;  ? ESOPHAGOGASTRODUODENOSCOPY (EGD) WITH PROPOFOL N/A 02/03/2022  ? Procedure: ESOPHAGOGASTRODUODENOSCOPY (EGD) WITH PROPOFOL;  Surgeon: Mauri Pole, MD;  Location: [facility] ENDOSCOPY;  Service: Gastroenterology;  Laterality: N/A;  ? HEMOSTASIS CLIP PLACEMENT  02/03/2022  ? Procedure: HEMOSTASIS CLIP PLACEMENT;  Surgeon: Mauri Pole, MD;  Location: [facility] ENDOSCOPY;  Service: Gastroenterology;;  ? IR EMBO TUMOR [place]  06/01/2020  ? IR PARACENTESIS  08/30/2021  ?  IR PARACENTESIS  09/22/2021  ? IR PARACENTESIS  10/12/2021  ? IR PARACENTESIS  11/02/2021  ? IR PARACENTESIS  11/15/2021  ? IR PARACENTESIS  11/30/2021  ? IR PARACENTESIS  12/14/2021  ? IR PARACENTESIS  12/27/2021  ? IR PARACENTESIS  01/04/2022  ? IR PARACENTESIS  01/12/2022  ? IR PARACENTESIS  01/19/2022  ? IR PARACENTESIS  02/02/2022  ? IR RADIOLOGIST EVAL & MGMT  04/28/2020  ? IR RADIOLOGIST EVAL & MGMT  07/15/2020  ? IR RADIOLOGIST EVAL & MGMT  05/04/2021  ? NO PAST SURGERIES   1980  ? ? ?Current Outpatient Medications  ?Medication Sig Dispense Refill  ? albuterol (VENTOLIN HFA) 108 (90 Base) MCG/ACT inhaler Inhale 2 puffs into the lungs every 6 (six) hours as needed for wheezing or shortness of breath. 18 g 3  ? busPIRone (BUSPAR) 7.5 MG tablet Take 1 tablet (7.5 mg total) by mouth 2 (two) times daily as needed. for anxiety (Patient taking differently: Take 7.5 mg by mouth See admin instructions. Take 7.5 mg at bedtime, may take a second 7.5 mg dose as needed for anxiety) 180 tablet 3  ? carvedilol (COREG) 6.25 MG tablet Take 1 tablet (6.25 mg total) by mouth 2 (two) times daily with a meal. 180 tablet 3  ? escitalopram (LEXAPRO) 5 MG tablet Take 1 tablet (5 mg total) by mouth daily. 90 tablet 3  ? finasteride (PROSCAR) 5 MG tablet TAKE 1 TABLET EVERY DAY 90 tablet 1  ? gabapentin (NEURONTIN) 300 MG capsule TAKE 1 CAPSULE TWICE DAILY (Patient taking differently: Take 300 mg by mouth 2 (two) times daily.) 180 capsule 3  ? oxyCODONE-acetaminophen (PERCOCET) 10-325 MG tablet Take 1 tablet by mouth every 8 (eight) hours as needed for pain (chronic pain in cancer patient with cirrhosis). (Patient taking differently: Take 0.5 tablets by mouth every 8 (eight) hours as needed for pain (chronic pain in cancer patient with cirrhosis).) 90 tablet 0  ? pantoprazole (PROTONIX) 40 MG tablet Take 1 tablet (40 mg total) by mouth 2 (two) times daily. 180 tablet 3  ? rosuvastatin (CRESTOR) 5 MG tablet TAKE 1 TABLET EVERY DAY 90 tablet 3  ? sucralfate (CARAFATE) 1 g tablet Take 1 tablet (1 g total) by mouth 4 (four) times daily. 120 tablet 1  ? tamsulosin (FLOMAX) 0.4 MG CAPS capsule TAKE 1 CAPSULE EVERY DAY (Patient taking differently: Take 0.4 mg by mouth daily.) 90 capsule 1  ? ?No current facility-administered medications for this visit.  ? ?Allergies:  Patient has no known allergies.  ? ?ROS: No syncope. ? ?Physical Exam: ?VS:  BP (!) 100/52   Pulse (!) 110   Ht _0  (1.803 m)   Wt 176 lb 9.6 oz  (80.1 kg)   SpO2 96%   BMI 24.63 kg/m? , BMI Body mass index is 24.63 kg/m?. ? ?Wt Readings from Last 3 Encounters:  ?02/07/22 176 lb 9.6 oz (80.1 kg)  ?02/03/22 170 lb (77.1 kg)  ?01/27/22 180 lb (81.6 kg)  ?  ?General: Chronically ill-appearing elderly male in no distress. ?HEENT: Conjunctiva and lids normal. ?Lungs: Decreased breath sounds at the bases, right greater than left. ?Cardiac: Regular rate and rhythm, no S3, 3/6 systolic murmur. ?Abdomen: Protuberant and nontender. ?Extremities: No pitting edema. ? ?ECG:  An ECG dated 12/14/2020 was personally reviewed today and demonstrated:  Sinus incomplete right bundle branch block, nonspecific ST-T changes. ? ?Recent Labwork: ?07/13/2021: Magnesium 1.9 ?07/28/2021: TSH 4.45 ?02/01/2022: ALT 13; AST 23; BUN 31; Creatinine, Ser  1.38; Hemoglobin 8.8; Platelets 114.0; Potassium 5.7; Sodium 140  ?   ?Component Value Date/Time  ? CHOL 120 11/07/2019 1146  ? TRIG 126.0 11/07/2019 1146  ? HDL 45.60 11/07/2019 1146  ? CHOLHDL 3 11/07/2019 1146  ? VLDL 25.2 11/07/2019 1146  ? LDLCALC 49 11/07/2019 1146  ? LDLDIRECT 57.0 10/22/2018 1451  ? ? ?Other Studies Reviewed Today: ? ?Echocardiogram 10/06/2021: ? 1. Left ventricular ejection fraction, by estimation, is 55%. The left  ?ventricle has normal function. The left ventricle has no regional wall  ?motion abnormalities. There is mild left ventricular hypertrophy. Left  ?ventricular diastolic parameters are  ?consistent with Grade I diastolic dysfunction (impaired relaxation).  ? 2. Right ventricular systolic function is normal. The right ventricular  ?size is normal.  ? 3. Left atrial size was mildly dilated.  ? 4. The mitral valve is abnormal. No evidence of mitral valve  ?regurgitation. No evidence of mitral stenosis. Moderate mitral annular  ?calcification.  ? 5. Gradients valve area and DVI similar to echo done 06/17/20. The aortic  ?valve is calcified. Aortic valve regurgitation is not visualized. Moderate  ?aortic valve  stenosis.  ? 6. The inferior vena cava is normal in size with greater than 50%  ?respiratory variability, suggesting right atrial pressure of 3 mmHg.  ? ?Assessment and Plan: ? ?1.  Moderate calcific aortic stenosis.

## 2022-02-07 NOTE — Patient Instructions (Signed)
Medication Instructions:   Your physician recommends that you continue on your current medications as directed. Please refer to the Current Medication list given to you today.  Labwork:  none  Testing/Procedures:  none  Follow-Up:  Your physician recommends that you schedule a follow-up appointment in: 3 months.  Any Other Special Instructions Will Be Listed Below (If Applicable).  If you need a refill on your cardiac medications before your next appointment, please call your pharmacy. 

## 2022-02-08 ENCOUNTER — Ambulatory Visit
Admission: RE | Admit: 2022-02-08 | Discharge: 2022-02-08 | Disposition: A | Discharge status: Home / Self Care | Payer: Medicare HMO | Source: Ambulatory Visit | Attending: Interventional Radiology | Admitting: Interventional Radiology

## 2022-02-08 DIAGNOSIS — I868 Varicose veins of other specified sites: Secondary | ICD-10-CM

## 2022-02-08 DIAGNOSIS — R188 Other ascites: Secondary | ICD-10-CM | POA: Diagnosis not present

## 2022-02-08 DIAGNOSIS — K921 Melena: Secondary | ICD-10-CM

## 2022-02-08 DIAGNOSIS — K922 Gastrointestinal hemorrhage, unspecified: Secondary | ICD-10-CM | POA: Diagnosis not present

## 2022-02-08 NOTE — Consult Note (Signed)
? ? ?Chief Complaint: ?Patient was consulted remotely today (TeleHealth) for Upper GI bleed and Recurrent Ascities ? ?Referring Physician(s): ?Dr. Harl Bowie ? ?History of Present Illness: ?[name] is a 81 y.o. 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  More recently he developed melena and anemia and underwent upper endoscopy with Dr. Silverio Decamp on 02/03/2022.  On Endoscopy, there were Grade I esophageal varices, portal hypertensive gastropahy, non bleeding gastric ulcer, and grade III duodenal ulcers.  He required 3 paracentesis in April, 2023. ? ?He feels ok today.  He denies melena, BRBPR, or hematemesis.  He also denies jaundice and mental confusion. ? ?Past Medical History:  ?Diagnosis Date  ? Aortic atherosclerosis ([place])   ? Benign prostatic hypertrophy   ? Blood transfusion without reported diagnosis   ? CHF (congestive heart failure) ([place])   ? a. EF 20-25% by echo in 11/2019  ? COPD (chronic obstructive pulmonary disease) ([place]) 12/15/2009  ? FEV1 2.30 (70%) ratio 63 no better with B2 and DLCO 18/6 (73%) corrects to 106%  ? Degenerative joint disease   ? Depression   ? Diverticulosis   ? Duodenal varices   ? Esophageal varices (HCC)   ? Essential hypertension   ? ACE inhibitor cough  ? Fatty liver   ? Gastric ulcer   ? Hepatic cirrhosis ([place])   ? Hepatocellular carcinoma ([place])   ? Hyperlipidemia   ? IDA (iron deficiency anemia)   ? Internal hemorrhoids   ? Liver cancer ([place])   ? Low back pain   ? Otitis externa 07/30/2012  ? Portal hypertensive gastropathy (HCC)   ? SBP (spontaneous bacterial peritonitis) ([place])   ? Sepsis ([place])   ? Splenomegaly   ? Thrombocytopenia ([place])   ? Type 2 diabetes mellitus ([place])   ? Ulcer of foot ([place])   ? Right foot  ? ? ?Past Surgical History:  ?Procedure Laterality Date  ? BIOPSY  07/14/2021  ? Procedure: BIOPSY;  Surgeon: Daryel November,  MD;  Location: [facility] ENDOSCOPY;  Service: Gastroenterology;;  EGD and COLON  ? BIOPSY  02/03/2022  ? Procedure: BIOPSY;  Surgeon: Mauri Pole, MD;  Location: [facility]OSCOPY;  Service: Gastroenterology;;  ? COLONOSCOPY  2020  ? JMP-MAC-plenvu (good)-polyps-recall 1 yr  ? COLONOSCOPY WITH PROPOFOL N/A 07/14/2021  ? Procedure: COLONOSCOPY WITH PROPOFOL;  Surgeon: Daryel November, MD;  Location: [place];  Service: Gastroenterology;  Laterality: N/A;  ? ESOPHAGOGASTRODUODENOSCOPY (EGD) WITH PROPOFOL N/A 07/14/2021  ? Procedure: ESOPHAGOGASTRODUODENOSCOPY (EGD) WITH PROPOFOL;  Surgeon: Daryel November, MD;  Location: [place];  Service: Gastroenterology;  Laterality: N/A;  ? ESOPHAGOGASTRODUODENOSCOPY (EGD) WITH PROPOFOL N/A 02/03/2022  ? Procedure: ESOPHAGOGASTRODUODENOSCOPY (EGD) WITH PROPOFOL;  Surgeon: Mauri Pole, MD;  Location: [facility] ENDOSCOPY;  Service: Gastroenterology;  Laterality: N/A;  ? HEMOSTASIS CLIP PLACEMENT  02/03/2022  ? Procedure: HEMOSTASIS CLIP PLACEMENT;  Surgeon: Mauri Pole, MD;  Location: [facility]OS[facility];  Service: Gastroenterology;;  ? IR EMBO TUMOR [place]  06/01/2020  ? IR PARACENTESIS  08/30/2021  ? IR PARACENTESIS  09/22/2021  ? IR PARACENTESIS  10/12/2021  ? IR PARACENTESIS  11/02/2021  ? IR PARACENTESIS  11/15/2021  ? IR PARACENTESIS  11/30/2021  ? IR PARACENTESIS  12/14/2021  ? IR PARACENTESIS  12/27/2021  ? IR PARACENTESIS  01/04/2022  ? IR PARACENTESIS  01/12/2022  ? IR PARACENTESIS  01/19/2022  ?  IR PARACENTESIS  02/02/2022  ? IR RADIOLOGIST EVAL & MGMT  04/28/2020  ? IR RADIOLOGIST EVAL & MGMT  07/15/2020  ? IR RADIOLOGIST EVAL & MGMT  05/04/2021  ? NO PAST SURGERIES  1980  ? ? ?Allergies: ?Patient has no known allergies. ? ?Medications: ?Prior to Admission medications   ?Medication Sig Start Date End Date Taking? Authorizing Provider  ?albuterol (VENTOLIN HFA) 108 (90 Base) MCG/ACT inhaler Inhale 2 puffs into the lungs every 6 (six)  hours as needed for wheezing or shortness of breath. 11/01/21   [name], MD  ?busPIRone (BUSPAR) 7.5 MG tablet Take 1 tablet (7.5 mg total) by mouth 2 (two) times daily as needed. for anxiety ?Patient taking differently: Take 7.5 mg by mouth See admin instructions. Take 7.5 mg at bedtime, may take a second 7.5 mg dose as needed for anxiety 05/14/21   [name], MD  ?carvedilol (COREG) 6.25 MG tablet Take 1 tablet (6.25 mg total) by mouth 2 (two) times daily with a meal. 02/01/22   [name], MD  ?escitalopram (LEXAPRO) 5 MG tablet Take 1 tablet (5 mg total) by mouth daily. 05/14/21   [name], MD  ?finasteride (PROSCAR) 5 MG tablet TAKE 1 TABLET EVERY DAY 01/10/22   [name], MD  ?gabapentin (NEURONTIN) 300 MG capsule TAKE 1 CAPSULE TWICE DAILY ?Patient taking differently: Take 300 mg by mouth 2 (two) times daily. 03/15/21   [name], MD  ?oxyCODONE-acetaminophen (PERCOCET) 10-325 MG tablet Take 1 tablet by mouth every 8 (eight) hours as needed for pain (chronic pain in cancer patient with cirrhosis). ?Patient taking differently: Take 0.5 tablets by mouth every 8 (eight) hours as needed for pain (chronic pain in cancer patient with cirrhosis). 02/01/22 03/03/22  [name], MD  ?pantoprazole (PROTONIX) 40 MG tablet Take 1 tablet (40 mg total) by mouth 2 (two) times daily. 02/01/22   [name], MD  ?rosuvastatin (CRESTOR) 5 MG tablet TAKE 1 TABLET EVERY DAY 11/16/21   [name], MD  ?sucralfate (CARAFATE) 1 g tablet Take 1 tablet (1 g total) by mouth 4 (four) times daily. 02/03/22 02/03/23  Mauri Pole, MD  ?tamsulosin (FLOMAX) 0.4 MG CAPS capsule TAKE 1 CAPSULE EVERY DAY ?Patient taking differently: Take 0.4 mg by mouth daily. 05/26/21   [name], MD  ?  ? ?Family History  ?Problem Relation Age of Onset  ? Melanoma Mother   ? Stroke Father   ? CAD Brother   ?     Premature disease  ? Colon polyps Neg Hx   ? Colon cancer Neg Hx   ? Esophageal  cancer Neg Hx   ? Stomach cancer Neg Hx   ? Rectal cancer Neg Hx   ? ? ?Social History  ? ?Socioeconomic History  ? Marital status: Unknown  ?  Spouse name: Not on file  ? Number of children: Not on file  ? Years of education: Not on file  ? Highest education level: Not on file  ?Occupational History  ? Occupation: retired  ?  Comment: maintenence work  ?Tobacco Use  ? Smoking status: Former  ?  Packs/day: 4.00  ?  Years: 20.00  ?  Pack years: 80.00  ?  Types: Cigarettes  ?  Quit date: 09/18/1984  ?  Years since quitting: 37.4  ? Smokeless tobacco: Never  ? Tobacco comments:  ?  quit in 1980  ?Vaping Use  ? Vaping Use: Never  used  ?Substance and Sexual Activity  ? Alcohol use: No  ?  Comment: no drinking since 40 years plus  ? Drug use: No  ? Sexual activity: Yes  ?Other Topics Concern  ? Not on file  ?Social History Narrative  ? ** Merged History Encounter **  ?    ? Married 53 years in 2015. 4 kids (2 sons) and 3 grandkids.   ? Lived in [place], [place] wholel life  ?   ? Retired from [employer]  ?   ? Hobbies-rest, going to beach where they have a camper  ?   ?   ?   ? ?Social Determinants of Health  ? ?Financial Resource Strain: Low Risk   ? Difficulty of Paying Living Expenses: Not hard at all  ?Food Insecurity: No Food Insecurity  ? Worried About Charity fundraiser in the Last Year: Never true  ? Ran Out of Food in the Last Year: Never true  ?Transportation Needs: No Transportation Needs  ? Lack of Transportation (Medical): No  ? Lack of Transportation (Non-Medical): No  ?Physical Activity: Inactive  ? Days of Exercise per Week: 0 days  ? Minutes of Exercise per Session: 0 min  ?Stress: No Stress Concern Present  ? Feeling of Stress : Only a little  ?Social Connections: Moderately Integrated  ? Frequency of Communication with Friends and Family: More than three times a week  ? Frequency of Social Gatherings with Friends and Family: More than three times a week  ? Attends Religious Services: More than 4 times  per year  ? Active Member of Clubs or Organizations: No  ? Attends Archivist Meetings: Never  ? Marital Status: Married  ? ? ? ?Review of Systems ? ?Review of Systems: A 12 point ROS discussed a

## 2022-02-09 ENCOUNTER — Other Ambulatory Visit (INDEPENDENT_AMBULATORY_CARE_PROVIDER_SITE_OTHER): Payer: Medicare HMO

## 2022-02-09 ENCOUNTER — Ambulatory Visit (INDEPENDENT_AMBULATORY_CARE_PROVIDER_SITE_OTHER): Payer: Medicare HMO | Admitting: Internal Medicine

## 2022-02-09 ENCOUNTER — Other Ambulatory Visit: Payer: Medicare HMO

## 2022-02-09 ENCOUNTER — Encounter: Payer: Self-pay | Admitting: Internal Medicine

## 2022-02-09 ENCOUNTER — Other Ambulatory Visit: Payer: Self-pay

## 2022-02-09 VITALS — BP 90/50 | HR 68 | Ht 71.0 in | Wt 180.0 lb

## 2022-02-09 DIAGNOSIS — K7031 Alcoholic cirrhosis of liver with ascites: Secondary | ICD-10-CM | POA: Diagnosis not present

## 2022-02-09 DIAGNOSIS — Z01818 Encounter for other preprocedural examination: Secondary | ICD-10-CM

## 2022-02-09 DIAGNOSIS — K253 Acute gastric ulcer without hemorrhage or perforation: Secondary | ICD-10-CM | POA: Diagnosis not present

## 2022-02-09 DIAGNOSIS — K766 Portal hypertension: Secondary | ICD-10-CM | POA: Diagnosis not present

## 2022-02-09 DIAGNOSIS — Z8619 Personal history of other infectious and parasitic diseases: Secondary | ICD-10-CM

## 2022-02-09 DIAGNOSIS — I868 Varicose veins of other specified sites: Secondary | ICD-10-CM

## 2022-02-09 DIAGNOSIS — K7682 Hepatic encephalopathy: Secondary | ICD-10-CM | POA: Diagnosis not present

## 2022-02-09 DIAGNOSIS — E875 Hyperkalemia: Secondary | ICD-10-CM | POA: Diagnosis not present

## 2022-02-09 LAB — BASIC METABOLIC PANEL
BUN: 32 mg/dL — ABNORMAL HIGH (ref 6–23)
CO2: 24 mEq/L (ref 19–32)
Calcium: 8.4 mg/dL (ref 8.4–10.5)
Chloride: 105 mEq/L (ref 96–112)
Creatinine, Ser: 2.01 mg/dL — ABNORMAL HIGH (ref 0.40–1.50)
GFR: 30.64 mL/min — ABNORMAL LOW (ref >60.00)
Glucose, Bld: 130 mg/dL — ABNORMAL HIGH (ref 70–99)
Potassium: 5.3 mEq/L — ABNORMAL HIGH (ref 3.5–5.1)
Sodium: 134 mEq/L — ABNORMAL LOW (ref 135–145)

## 2022-02-09 MED ORDER — CIPROFLOXACIN HCL 250 MG PO TABS: 250.0000 mg | ORAL_TABLET | Freq: Every day | ORAL | 2 refills | Status: DC

## 2022-02-09 MED ORDER — RIFAXIMIN 550 MG PO TABS: 550.0000 mg | ORAL_TABLET | Freq: Two times a day (BID) | ORAL | 2 refills | Status: DC

## 2022-02-09 NOTE — Patient Instructions (Addendum)
We have sent the following medications to your pharmacy for you to pick up at your convenience: ?Xifaxan - sent to [facility] mail order pharmacy.  ? ?Continue Carvedilol as directed.  ? ?Continue Pantoprazole as directed.  ? ?You have been scheduled for a Echocardiogram at [facility] on 02/14/22 at 8:00am. Please arrive 15 minutes prior to your appointment for registration. Make certain not to have anything to eat or drink 6 hours prior to your appointment. Should you need to reschedule your appointment, please contact radiology at [phone]. This test typically takes about 30 minutes to perform. ? ?If you are age 81 or older, your body mass index should be between 23-30. Your Body mass index is 25.1 kg/m?Marland Kitchen If this is out of the aforementioned range listed, please consider follow up with your Primary Care Provider. ? ? ?The [facility] providers would like to encourage you to use [facility] to communicate with providers for non-urgent requests or questions.  Due to long hold times on the telephone, sending your provider a message by [facility] may be a faster and more efficient way to get a response.  Please allow 48 business hours for a response.  Please remember that this is for non-urgent requests.  ? ?Thank you for choosing me and [facility]. ? ?Dr.Jay Pyrtle ? ? ?

## 2022-02-09 NOTE — Progress Notes (Signed)
? ?Subjective:  ? ? Patient ID: [name], male    DOB: March 25, 1941, 81 y.o.   MRN: [id number] ? ?HPI ?[name] is an 81 year old male with cirrhosis with portal hypertension complicated by ascites, history of SBP, concern for recent bleeding duodenal varix, recurrent duodenal ulcer, HCC status post SBRT, hx CHF, diabetes, CKD-3, anemia who is here for follow-up.  He is here today with his wife.  Last Thursday Dr. Silverio Decamp performed an upper endoscopy after several days of melena with acute on chronic anemia requiring blood transfusion. ? ?EGD revealed a clean-based 10 mm antral ulcer.  Small grade 1 lower esophageal varices without stigmata.  Moderate portal hypertensive gastropathy.  Grade 3 varix in the first portion of the duodenum with mucosal ulceration and stigmata of recent bleeding. ?Biopsies from the stomach/ulcer were negative for H. pylori and dysplasia. ? ?Subsequent to this upper endoscopy he had a CT angiography which showed extensive abdominopelvic atherosclerosis with no acute arterial finding by CTA.  He had patent hepatic and portal venous anatomy.  Negative for gastric renal or splenorenal shunt.  No significant evidence of esophageal or gastric varices appreciated by CT.  Nonvascular portion of the exam was stable other than moderate ascites. ? ?He had a 7.5 L paracentesis 1 week ago. ?He has been seen by IR Dr. Serafina Royals and Dr. Dwaine Gale to consider TIPS. ? ?He reports that he is feeling fairly well today.  He feels that daily he is getting somewhat stronger.  No further black stool.  No abdominal pain.  No diarrhea.  No fevers or chills. ? ?He has been seen by nephrology and cardiology recently as well. ? ?He seems to be tolerating the carvedilol. ? ?Review of Systems ?As per HPI, otherwise negative ? ?Current Medications, Allergies, Past Medical History, Past Surgical History, Family History and Social History were reviewed in [employer] record. ?   ?Objective:  ?  Physical Exam ?BP (!) 90/50   Pulse 68   Ht '5\' 11"'$  (1.803 m)   Wt 180 lb (81.6 kg)   BMI 25.10 kg/m?  ?Gen: awake, alert, NAD ?HEENT: anicteric, op clear ?CV: RRR ?Pulm: CTA b/l ?Abd: soft, nontender, moderately distended with ascites but not tense,  +BS throughout ?Ext: no c/c, trace pretibial edema today ?Neuro: nonfocal, no asterixis ? ? ?  Latest Ref Rng & Units 02/01/2022  ?  1:50 PM 01/27/2022  ? 10:24 AM 12/23/2021  ? 11:56 AM  ?CBC  ?WBC 4.0 - 10.5 K/uL 5.5   4.3   4.3    ?Hemoglobin 13.0 - 17.0 g/dL 8.8   6.3   8.3 Repeated and verified X2.    ?Hematocrit 39.0 - 52.0 % 27.9   20.9   25.4 Repeated and verified X2.    ?Platelets 150.0 - 400.0 K/uL 114.0   124   98.0    ? ?Lab Results  ?Component Value Date  ? INR 1.0 12/23/2021  ? INR 1.3 (H) 11/03/2021  ? INR 1.1 10/08/2021  ? ?CMP  ?   ?Component Value Date/Time  ? NA 140 02/01/2022 1350  ? NA 134 (A) 01/06/2022 0000  ? K 5.7 (H) 02/01/2022 1350  ? CL 108 02/01/2022 1350  ? CO2 25 02/01/2022 1350  ? GLUCOSE 136 (H) 02/01/2022 1350  ? BUN 31 (H) 02/01/2022 1350  ? BUN 66 (A) 01/06/2022 0000  ? CREATININE 1.38 02/01/2022 1350  ? CREATININE 1.69 (H) 01/27/2022 1024  ? CALCIUM 8.5 02/01/2022 1350  ?  PROT 5.4 (L) 02/01/2022 1350  ? ALBUMIN 3.1 (L) 02/01/2022 1350  ? AST 23 02/01/2022 1350  ? AST 19 01/27/2022 1024  ? ALT 13 02/01/2022 1350  ? ALT 10 01/27/2022 1024  ? ALKPHOS 102 02/01/2022 1350  ? BILITOT 0.6 02/01/2022 1350  ? BILITOT 0.5 01/27/2022 1024  ? GFRNONAA 40 (L) 01/27/2022 1024  ? GFRAA 59 (L) 06/09/2020 0859  ? ? ? ?   ?Assessment & Plan:  ?81 year old male with cirrhosis with portal hypertension complicated by ascites, history of SBP, concern for recent bleeding duodenal varix, recurrent duodenal ulcer, HCC status post SBRT, hx CHF, diabetes, CKD-3, anemia who is here for follow-up.  ? ?Decompensated cirrhosis with portal hypertension/recurrent ascites/recent discovery of duodenal varix with stigmata/history of HCC status post ablative therapy  --today he seems stable.  Recent lab work shows improvement in hemoglobin after recent transfusion.  No further melena.  We discussed TIPS today at length which would be expected to improve ascites, possibly improve renal function, significantly reduce the risk of bleeding from varices and portal gastropathy.  We also discussed the risks which include exacerbation of heart failure, hepatic encephalopathy, and liver failure any of which complication could lead to death without the option for transfusion.  He is also recently discussed this with IR.  Plan as follows: ?--TIPS: See my discussion above; I do feel that TIPS is a very reasonable treatment for his recurrent, diuretic refractory ascites; reduce the risk of bleeding particularly in light of recently discovered duodenal varix; and possibly improve renal function.  We discussed the risk of hepatic encephalopathy, heart failure and liver failure.  I would want to pretreat for hepatic encephalopathy and continue therapy thereafter.  After our discussion he wishes to proceed ? A.  I have ordered echocardiogram ? B.  He will follow-up with IR for probable TIPS in the next 3 to 4 weeks assuming echocardiogram does not reveal significant heart failure or right heart strain ? C: Begin prophylactic rifaximin 550 mg twice daily ?--Ascites: Hyperkalemia precludes the use of spironolactone; recently seen by nephrology and continued furosemide 40 mg daily with potassium supplementation ? A.  Periodic LVP with IV albumin; plan repeat LVP on 02/16/2022 up to 8 L with 50 g of IV albumin  ? B.  Continue Cipro 400 mg daily for prophylaxis of SBP ?--Varices: See TIPS discussion above ?--HCC: Prior treatment and following with Dr. Irene Limbo; stable recently by CTA ?--Low-sodium diet ? ?2. Anemia --recent blood transfusion, following blood counts closely with hematology and primary care ? ?3.  PUD --gastric ulcer, recurrent.  H. pylori negative.  Indefinite pantoprazole 40 mg twice  daily and avoiding NSAIDs and aspirin. ? ? ?

## 2022-02-09 NOTE — Progress Notes (Signed)
Prescription for Cipro '250mg'$  once daily has been sent to [facility] per Dr.Pyrtle. Pt needs to continue Cipro '250mg'$  once daily for hx of spontaneous bacterial peritonitis.  ?

## 2022-02-10 ENCOUNTER — Telehealth: Payer: Self-pay

## 2022-02-10 ENCOUNTER — Other Ambulatory Visit: Payer: Self-pay

## 2022-02-10 DIAGNOSIS — K7031 Alcoholic cirrhosis of liver with ascites: Secondary | ICD-10-CM

## 2022-02-10 DIAGNOSIS — C9110 Chronic lymphocytic leukemia of B-cell type not having achieved remission: Secondary | ICD-10-CM

## 2022-02-10 NOTE — Telephone Encounter (Signed)
Pt scheduled for IR para as requested by Dr. Norman Herrlich. Scheduled for 02/16/22 at 1pm, pt to arrive there at 12:30pm. Spoke with pts daughter and she is aware of appt. ?

## 2022-02-11 ENCOUNTER — Inpatient Hospital Stay: Payer: Medicare HMO

## 2022-02-11 ENCOUNTER — Inpatient Hospital Stay: Payer: Medicare HMO | Attending: Hematology

## 2022-02-11 ENCOUNTER — Other Ambulatory Visit: Payer: Self-pay

## 2022-02-11 DIAGNOSIS — C220 Liver cell carcinoma: Secondary | ICD-10-CM | POA: Diagnosis not present

## 2022-02-11 DIAGNOSIS — C9110 Chronic lymphocytic leukemia of B-cell type not having achieved remission: Secondary | ICD-10-CM

## 2022-02-11 DIAGNOSIS — D649 Anemia, unspecified: Secondary | ICD-10-CM | POA: Diagnosis not present

## 2022-02-11 LAB — CMP (CANCER CENTER ONLY)
ALT: 9 U/L (ref 0–44)
AST: 18 U/L (ref 15–41)
Albumin: 2.8 g/dL — ABNORMAL LOW (ref 3.5–5.0)
Alkaline Phosphatase: 100 U/L (ref 38–126)
Anion gap: 5 (ref 5–15)
BUN: 34 mg/dL — ABNORMAL HIGH (ref 8–23)
CO2: 23 mmol/L (ref 22–32)
Calcium: 8.3 mg/dL — ABNORMAL LOW (ref 8.9–10.3)
Chloride: 107 mmol/L (ref 98–111)
Creatinine: 2.03 mg/dL — ABNORMAL HIGH (ref 0.61–1.24)
GFR, Estimated: 32 mL/min — ABNORMAL LOW (ref >60)
Glucose, Bld: 169 mg/dL — ABNORMAL HIGH (ref 70–99)
Potassium: 4.7 mmol/L (ref 3.5–5.1)
Sodium: 135 mmol/L (ref 135–145)
Total Bilirubin: 0.5 mg/dL (ref 0.3–1.2)
Total Protein: 5.3 g/dL — ABNORMAL LOW (ref 6.5–8.1)

## 2022-02-11 LAB — CBC WITH DIFFERENTIAL (CANCER CENTER ONLY)
Abs Immature Granulocytes: 0.02 10*3/uL (ref 0.00–0.07)
Basophils Absolute: 0 10*3/uL (ref 0.0–0.1)
Basophils Relative: 1 %
Eosinophils Absolute: 0.1 10*3/uL (ref 0.0–0.5)
Eosinophils Relative: 3 %
HCT: 26.1 % — ABNORMAL LOW (ref 39.0–52.0)
Hemoglobin: 7.9 g/dL — ABNORMAL LOW (ref 13.0–17.0)
Immature Granulocytes: 1 %
Lymphocytes Relative: 15 %
Lymphs Abs: 0.6 10*3/uL — ABNORMAL LOW (ref 0.7–4.0)
MCH: 29.7 pg (ref 26.0–34.0)
MCHC: 30.3 g/dL (ref 30.0–36.0)
MCV: 98.1 fL (ref 80.0–100.0)
Monocytes Absolute: 0.3 10*3/uL (ref 0.1–1.0)
Monocytes Relative: 7 %
Neutro Abs: 3 10*3/uL (ref 1.7–7.7)
Neutrophils Relative %: 73 %
Platelet Count: 102 10*3/uL — ABNORMAL LOW (ref 150–400)
RBC: 2.66 MIL/uL — ABNORMAL LOW (ref 4.22–5.81)
RDW: 18.6 % — ABNORMAL HIGH (ref 11.5–15.5)
WBC Count: 4.1 10*3/uL (ref 4.0–10.5)
nRBC: 0 % (ref 0.0–0.2)

## 2022-02-11 LAB — PREPARE RBC (CROSSMATCH)

## 2022-02-11 LAB — SAMPLE TO BLOOD BANK

## 2022-02-11 MED ORDER — ACETAMINOPHEN 325 MG PO TABS
650.0000 mg | ORAL_TABLET | Freq: Once | ORAL | Status: AC
  Administered 2022-02-11: 650 mg via ORAL
  Filled 2022-02-11: qty 2

## 2022-02-11 MED ORDER — SODIUM CHLORIDE 0.9% IV SOLUTION
250.0000 mL | Freq: Once | INTRAVENOUS | Status: AC
  Administered 2022-02-11: 250 mL via INTRAVENOUS

## 2022-02-11 MED ORDER — METHYLPREDNISOLONE SODIUM SUCC 40 MG IJ SOLR
40.0000 mg | Freq: Once | INTRAMUSCULAR | Status: AC
  Administered 2022-02-11: 40 mg via INTRAVENOUS
  Filled 2022-02-11: qty 1

## 2022-02-11 NOTE — Patient Instructions (Signed)
Blood Transfusion, Adult, Care After This sheet gives you information about how to care for yourself after your procedure. Your doctor may also give you more specific instructions. If you have problems or questions, contact your doctor. What can I expect after the procedure? After the procedure, it is common to have: Bruising and soreness at the IV site. A headache. Follow these instructions at home: Insertion site care     Follow instructions from your doctor about how to take care of your insertion site. This is where an IV tube was put into your vein. Make sure you: Wash your hands with soap and water before and after you change your bandage (dressing). If you cannot use soap and water, use hand sanitizer. Change your bandage as told by your doctor. Check your insertion site every day for signs of infection. Check for: Redness, swelling, or pain. Bleeding from the site. Warmth. Pus or a bad smell. General instructions Take over-the-counter and prescription medicines only as told by your doctor. Rest as told by your doctor. Go back to your normal activities as told by your doctor. Keep all follow-up visits as told by your doctor. This is important. Contact a doctor if: You have itching or red, swollen areas of skin (hives). You feel worried or nervous (anxious). You feel weak after doing your normal activities. You have redness, swelling, warmth, or pain around the insertion site. You have blood coming from the insertion site, and the blood does not stop with pressure. You have pus or a bad smell coming from the insertion site. Get help right away if: You have signs of a serious reaction. This may be coming from an allergy or the body's defense system (immune system). Signs include: Trouble breathing or shortness of breath. Swelling of the face or feeling warm (flushed). Fever or chills. Head, chest, or back pain. Dark pee (urine) or blood in the pee. Widespread rash. Fast  heartbeat. Feeling dizzy or light-headed. You may receive your blood transfusion in an outpatient setting. If so, you will be told whom to contact to report any reactions. These symptoms may be an emergency. Do not wait to see if the symptoms will go away. Get medical help right away. Call your local emergency services (911 in the [place] Do not drive yourself to the hospital. Summary Bruising and soreness at the IV site are common. Check your insertion site every day for signs of infection. Rest as told by your doctor. Go back to your normal activities as told by your doctor. Get help right away if you have signs of a serious reaction. This information is not intended to replace advice given to you by your health care provider. Make sure you discuss any questions you have with your health care provider. Document Revised: 01/21/2021 Document Reviewed: 03/21/2019 Elsevier Patient Education  2023 Elsevier Inc.  

## 2022-02-12 LAB — BPAM RBC
Blood Product Expiration Date: 202305252359
ISSUE DATE / TIME: 202305051426
Unit Type and Rh: 600

## 2022-02-12 LAB — TYPE AND SCREEN
ABO/RH(D): A NEG
Antibody Screen: NEGATIVE
Unit division: 0

## 2022-02-14 ENCOUNTER — Telehealth: Payer: Self-pay | Admitting: *Deleted

## 2022-02-14 ENCOUNTER — Ambulatory Visit (HOSPITAL_COMMUNITY)
Admission: RE | Admit: 2022-02-14 | Discharge: 2022-02-14 | Disposition: A | Discharge status: Home / Self Care | Payer: Medicare HMO | Source: Ambulatory Visit | Attending: Internal Medicine | Admitting: Internal Medicine

## 2022-02-14 DIAGNOSIS — K7682 Hepatic encephalopathy: Secondary | ICD-10-CM | POA: Insufficient documentation

## 2022-02-14 DIAGNOSIS — I082 Rheumatic disorders of both aortic and tricuspid valves: Secondary | ICD-10-CM | POA: Diagnosis not present

## 2022-02-14 DIAGNOSIS — E785 Hyperlipidemia, unspecified: Secondary | ICD-10-CM | POA: Diagnosis not present

## 2022-02-14 DIAGNOSIS — J449 Chronic obstructive pulmonary disease, unspecified: Secondary | ICD-10-CM | POA: Insufficient documentation

## 2022-02-14 DIAGNOSIS — E119 Type 2 diabetes mellitus without complications: Secondary | ICD-10-CM | POA: Insufficient documentation

## 2022-02-14 DIAGNOSIS — I350 Nonrheumatic aortic (valve) stenosis: Secondary | ICD-10-CM | POA: Diagnosis not present

## 2022-02-14 DIAGNOSIS — Z01818 Encounter for other preprocedural examination: Secondary | ICD-10-CM | POA: Diagnosis not present

## 2022-02-14 LAB — ECHOCARDIOGRAM COMPLETE
AR max vel: 1.8 cm2
AV Area VTI: 2.09 cm2
AV Area mean vel: 1.69 cm2
AV Mean grad: 13 mmHg
AV Peak grad: 22.8 mmHg
Ao pk vel: 2.39 m/s
Area-P 1/2: 4.29 cm2
Calc EF: 53.7 %
MV M vel: 5.15 m/s
MV Peak grad: 106.1 mmHg
S' Lateral: 3 cm
Single Plane A2C EF: 55.4 %
Single Plane A4C EF: 51.7 %

## 2022-02-14 MED ORDER — RIFAXIMIN 550 MG PO TABS: 550.0000 mg | ORAL_TABLET | Freq: Two times a day (BID) | ORAL | 4 refills | Status: DC

## 2022-02-14 NOTE — Telephone Encounter (Signed)
Patient's insurance has approved Xifaxan twice daily dosing through 10/09/22. However, only a 30 day supply per fill is approved, not the 90 day mail order supply. Will send 30 day rx with refills to patient's local pharmacy. ?

## 2022-02-15 ENCOUNTER — Ambulatory Visit: Payer: Medicare HMO | Admitting: Family Medicine

## 2022-02-15 ENCOUNTER — Other Ambulatory Visit (HOSPITAL_COMMUNITY): Payer: Self-pay | Admitting: Interventional Radiology

## 2022-02-15 DIAGNOSIS — K7031 Alcoholic cirrhosis of liver with ascites: Secondary | ICD-10-CM

## 2022-02-16 ENCOUNTER — Ambulatory Visit (HOSPITAL_COMMUNITY)
Admission: RE | Admit: 2022-02-16 | Discharge: 2022-02-16 | Disposition: A | Discharge status: Home / Self Care | Payer: Medicare HMO | Source: Ambulatory Visit | Attending: Internal Medicine | Admitting: Internal Medicine

## 2022-02-16 DIAGNOSIS — R188 Other ascites: Secondary | ICD-10-CM | POA: Diagnosis not present

## 2022-02-16 DIAGNOSIS — K7031 Alcoholic cirrhosis of liver with ascites: Secondary | ICD-10-CM

## 2022-02-16 HISTORY — PX: IR PARACENTESIS: IMG2679

## 2022-02-16 LAB — BODY FLUID CELL COUNT WITH DIFFERENTIAL
Eos, Fluid: 0 %
Lymphs, Fluid: 31 %
Monocyte-Macrophage-Serous Fluid: 64 % (ref 50–90)
Neutrophil Count, Fluid: 5 % (ref 0–25)
Total Nucleated Cell Count, Fluid: 27 cu mm (ref 0–1000)

## 2022-02-16 MED ORDER — ALBUMIN HUMAN 25 % IV SOLN
INTRAVENOUS | Status: AC
  Administered 2022-02-16: 50 g via INTRAVENOUS
  Filled 2022-02-16: qty 200

## 2022-02-16 MED ORDER — ALBUMIN HUMAN 25 % IV SOLN
50.0000 g | Freq: Once | INTRAVENOUS | Status: AC
Start: 2022-02-16 — End: 2022-02-16

## 2022-02-16 MED ORDER — LIDOCAINE HCL 1 % IJ SOLN
INTRAMUSCULAR | Status: AC
  Administered 2022-02-16: 10 mL
  Filled 2022-02-16: qty 20

## 2022-02-16 NOTE — Procedures (Signed)
PROCEDURE SUMMARY: ? ?Successful ultrasound guided diagnostic and therapeutic paracentesis from the right lower quadrant.  ?Yielded 8 L of clear, yellow  fluid.  ?No immediate complications.  ?The patient tolerated the procedure well.  ? ?Specimen was sent for labs. ? ?EBL < 47m ? ?The patient has required >/=2 paracenteses in a 30 day period and a screening evaluation by the [facility] has been arranged. ? ? ? ?SNarda Rutherford AGNP-BC ?02/16/2022, 2:46 PM ? ? ?  ?

## 2022-02-17 ENCOUNTER — Encounter (HOSPITAL_COMMUNITY): Payer: Self-pay

## 2022-02-17 ENCOUNTER — Telehealth: Payer: Self-pay | Admitting: Internal Medicine

## 2022-02-17 ENCOUNTER — Telehealth: Payer: Self-pay | Admitting: *Deleted

## 2022-02-17 LAB — PATHOLOGIST SMEAR REVIEW

## 2022-02-17 NOTE — Telephone Encounter (Signed)
-----   Message from Jerene Bears, MD sent at 02/17/2022 11:01 AM EDT ----- ?Yes, we can reschedule my office visit ?Dottie please reschedule if possible about 2 weeks after TIPS and if I have nothing then with [facility] or another APP ?JMP ? ?----- Message ----- ?From: Joanell Rising ?Sent: 02/17/2022  10:06 AM EDT ?To: Jerene Bears, MD ? ?Dr. Hilarie Fredrickson, ? ?Dr. Dwaine Gale is planning to perform a TIPS procedure on Mr. Armistead on 02/28/22. I see in his chart that he has a follow-up appointment scheduled with you that day. Could you possibly reschedule that appointment so that he can have his TIPS that day? I hate to ask that of you, however that is first available date we have to treat him and he is very anxious to have this procedure. Please advise and thank you so kindly! ? ?Thanks ?Miles Costain ? ? ?

## 2022-02-17 NOTE — Telephone Encounter (Signed)
I have spoken to patient to advise that unfortunately, there is not another medication that would work quite the way [place] does for his cirrhosis. I have advised that I will fill out patient assistance paperwork for the medication and he can fill out patient portion. Hopefully, this will allow patient to obtain the medication. Patient verbalizes understanding. Paperwork placed on Dr [employer] for [employer]. ?

## 2022-02-17 NOTE — Telephone Encounter (Signed)
We received a call from patient spouse requesting an alternative to xifaxan medication. Per spouse, that medication is too expensive. Please advise. ?

## 2022-02-17 NOTE — Telephone Encounter (Signed)
Patient cancelled from 02/28/22 visit with Dr Hilarie Fredrickson. He has been scheduled to see Nicoletta Ba, PA-C on 03/16/22 at 230 following TIPS procedure. Patient is advised of this and verbalizes understanding. ?

## 2022-02-23 ENCOUNTER — Other Ambulatory Visit: Payer: Self-pay

## 2022-02-23 DIAGNOSIS — C9110 Chronic lymphocytic leukemia of B-cell type not having achieved remission: Secondary | ICD-10-CM

## 2022-02-24 ENCOUNTER — Inpatient Hospital Stay: Payer: Medicare HMO

## 2022-02-24 ENCOUNTER — Encounter (HOSPITAL_COMMUNITY): Payer: Self-pay | Admitting: Radiology

## 2022-02-24 ENCOUNTER — Other Ambulatory Visit: Payer: Self-pay

## 2022-02-24 ENCOUNTER — Inpatient Hospital Stay: Payer: Medicare HMO | Admitting: Hematology

## 2022-02-24 DIAGNOSIS — C220 Liver cell carcinoma: Secondary | ICD-10-CM | POA: Diagnosis not present

## 2022-02-24 DIAGNOSIS — D649 Anemia, unspecified: Secondary | ICD-10-CM | POA: Diagnosis not present

## 2022-02-24 DIAGNOSIS — C9110 Chronic lymphocytic leukemia of B-cell type not having achieved remission: Secondary | ICD-10-CM

## 2022-02-24 LAB — CBC WITH DIFFERENTIAL (CANCER CENTER ONLY)
Abs Immature Granulocytes: 0.02 K/uL (ref 0.00–0.07)
Basophils Absolute: 0 K/uL (ref 0.0–0.1)
Basophils Relative: 1 %
Eosinophils Absolute: 0.1 K/uL (ref 0.0–0.5)
Eosinophils Relative: 2 %
HCT: 30.1 % — ABNORMAL LOW (ref 39.0–52.0)
Hemoglobin: 9.8 g/dL — ABNORMAL LOW (ref 13.0–17.0)
Immature Granulocytes: 1 %
Lymphocytes Relative: 17 %
Lymphs Abs: 0.6 K/uL — ABNORMAL LOW (ref 0.7–4.0)
MCH: 30.6 pg (ref 26.0–34.0)
MCHC: 32.6 g/dL (ref 30.0–36.0)
MCV: 94.1 fL (ref 80.0–100.0)
Monocytes Absolute: 0.3 K/uL (ref 0.1–1.0)
Monocytes Relative: 8 %
Neutro Abs: 2.7 K/uL (ref 1.7–7.7)
Neutrophils Relative %: 71 %
Platelet Count: 91 K/uL — ABNORMAL LOW (ref 150–400)
RBC: 3.2 MIL/uL — ABNORMAL LOW (ref 4.22–5.81)
RDW: 18.2 % — ABNORMAL HIGH (ref 11.5–15.5)
WBC Count: 3.8 K/uL — ABNORMAL LOW (ref 4.0–10.5)
nRBC: 0 % (ref 0.0–0.2)

## 2022-02-24 LAB — CMP (CANCER CENTER ONLY)
ALT: 10 U/L (ref 0–44)
AST: 29 U/L (ref 15–41)
Albumin: 3 g/dL — ABNORMAL LOW (ref 3.5–5.0)
Alkaline Phosphatase: 114 U/L (ref 38–126)
Anion gap: 4 — ABNORMAL LOW (ref 5–15)
BUN: 33 mg/dL — ABNORMAL HIGH (ref 8–23)
CO2: 25 mmol/L (ref 22–32)
Calcium: 8.6 mg/dL — ABNORMAL LOW (ref 8.9–10.3)
Chloride: 107 mmol/L (ref 98–111)
Creatinine: 1.78 mg/dL — ABNORMAL HIGH (ref 0.61–1.24)
GFR, Estimated: 38 mL/min — ABNORMAL LOW (ref >60)
Glucose, Bld: 121 mg/dL — ABNORMAL HIGH (ref 70–99)
Potassium: 4.7 mmol/L (ref 3.5–5.1)
Sodium: 136 mmol/L (ref 135–145)
Total Bilirubin: 0.8 mg/dL (ref 0.3–1.2)
Total Protein: 5.7 g/dL — ABNORMAL LOW (ref 6.5–8.1)

## 2022-02-24 LAB — IRON AND IRON BINDING CAPACITY (CC-WL,HP ONLY)
Iron: 64 ug/dL (ref 45–182)
Saturation Ratios: 32 % (ref 17.9–39.5)
TIBC: 199 ug/dL — ABNORMAL LOW (ref 250–450)
UIBC: 135 ug/dL (ref 117–376)

## 2022-02-24 LAB — SAMPLE TO BLOOD BANK

## 2022-02-24 LAB — FERRITIN: Ferritin: 134 ng/mL (ref 24–336)

## 2022-02-24 NOTE — Progress Notes (Signed)
Per Dr.Kale no blood transfusion needed today hbg 9.8, infusion RN made aware.

## 2022-02-25 ENCOUNTER — Telehealth: Payer: Self-pay

## 2022-02-25 ENCOUNTER — Telehealth: Payer: Self-pay | Admitting: Internal Medicine

## 2022-02-25 ENCOUNTER — Other Ambulatory Visit: Payer: Self-pay

## 2022-02-25 ENCOUNTER — Encounter: Payer: Self-pay | Admitting: Podiatry

## 2022-02-25 ENCOUNTER — Ambulatory Visit (INDEPENDENT_AMBULATORY_CARE_PROVIDER_SITE_OTHER): Payer: Medicare HMO | Admitting: Podiatry

## 2022-02-25 DIAGNOSIS — M2041 Other hammer toe(s) (acquired), right foot: Secondary | ICD-10-CM

## 2022-02-25 DIAGNOSIS — M2042 Other hammer toe(s) (acquired), left foot: Secondary | ICD-10-CM

## 2022-02-25 DIAGNOSIS — M79675 Pain in left toe(s): Secondary | ICD-10-CM | POA: Diagnosis not present

## 2022-02-25 DIAGNOSIS — L84 Corns and callosities: Secondary | ICD-10-CM | POA: Diagnosis not present

## 2022-02-25 DIAGNOSIS — Z8631 Personal history of diabetic foot ulcer: Secondary | ICD-10-CM | POA: Diagnosis not present

## 2022-02-25 DIAGNOSIS — B351 Tinea unguium: Secondary | ICD-10-CM

## 2022-02-25 DIAGNOSIS — E1142 Type 2 diabetes mellitus with diabetic polyneuropathy: Secondary | ICD-10-CM

## 2022-02-25 DIAGNOSIS — M79674 Pain in right toe(s): Secondary | ICD-10-CM

## 2022-02-25 DIAGNOSIS — K7031 Alcoholic cirrhosis of liver with ascites: Secondary | ICD-10-CM

## 2022-02-25 NOTE — Telephone Encounter (Signed)
Pt scheduled for IR para at [facility] 02/28/22 at 9am, pt to arrive there at 8:30am. IR para as orders below and pt to receive IV albumin per IR protocol. Pt aware of appt.

## 2022-02-25 NOTE — Telephone Encounter (Signed)
Pyrtle pt with cirrhosis calling requesting IR para. Last one was done 5/10, he is scheduled for tips procedure 5/30. Previous orders below:  If therapeutic, is there a maximum amount of fluid to be removed? Yes  What is the maximum amount of fluide to be removed? 8 liters  Are labs required for specimen collection? Yes  Lab orders requested (DO NOT place separate lab orders, these will be automatically ordered during procedure specimen collection): Body Fluid Cell Count With Differential  Is Albumin medication needed? Yes  A seperate Albumin medication order is required. I understand I need to place a separate order for albumin  Reason for Exam (SYMPTOM  OR DIAGNOSIS REQUIRED) ascites  Note: Appropriate reason for exam on pre-op X-rays should indicate the reason for the surgery. Examples include: Coronary Artery disease, osteoarthritis of the knee, brain tumor, etc.  Preferred Imaging Location? [facility]        Please advise regarding IR Para as DOD.

## 2022-02-25 NOTE — Telephone Encounter (Signed)
CMN submitted ?

## 2022-02-25 NOTE — Telephone Encounter (Signed)
Patient's wife called stating patient needed an appointment set up for him to have a paracentesis.  She says they are usually done at [facility] on [address].  She thought she had a standing order, but they told her they did not see one.  Please call patient and advise.  Thank you.

## 2022-02-28 ENCOUNTER — Inpatient Hospital Stay (HOSPITAL_COMMUNITY): Payer: Medicare HMO

## 2022-02-28 ENCOUNTER — Ambulatory Visit: Payer: Medicare HMO | Admitting: Internal Medicine

## 2022-02-28 ENCOUNTER — Ambulatory Visit (HOSPITAL_COMMUNITY)
Admission: RE | Admit: 2022-02-28 | Discharge: 2022-02-28 | Disposition: A | Discharge status: Home / Self Care | Payer: Medicare HMO | Source: Ambulatory Visit | Attending: Gastroenterology | Admitting: Gastroenterology

## 2022-02-28 DIAGNOSIS — K7031 Alcoholic cirrhosis of liver with ascites: Secondary | ICD-10-CM | POA: Diagnosis not present

## 2022-02-28 DIAGNOSIS — R188 Other ascites: Secondary | ICD-10-CM | POA: Diagnosis not present

## 2022-02-28 HISTORY — PX: IR PARACENTESIS: IMG2679

## 2022-02-28 LAB — BODY FLUID CELL COUNT WITH DIFFERENTIAL
Eos, Fluid: 0 %
Lymphs, Fluid: 65 %
Monocyte-Macrophage-Serous Fluid: 33 % — ABNORMAL LOW (ref 50–90)
Neutrophil Count, Fluid: 2 % (ref 0–25)
Total Nucleated Cell Count, Fluid: 48 cu mm (ref 0–1000)

## 2022-02-28 MED ORDER — ALBUMIN HUMAN 25 % IV SOLN
50.0000 g | Freq: Once | INTRAVENOUS | Status: AC
Start: 2022-02-28 — End: 2022-02-28
  Administered 2022-02-28: 50 g via INTRAVENOUS
  Filled 2022-02-28: qty 200

## 2022-02-28 MED ORDER — ALBUMIN HUMAN 25 % IV SOLN
INTRAVENOUS | Status: AC
  Filled 2022-02-28: qty 200

## 2022-02-28 MED ORDER — LIDOCAINE HCL 1 % IJ SOLN
INTRAMUSCULAR | Status: DC | PRN
  Administered 2022-02-28: 10 mL

## 2022-02-28 MED ORDER — LIDOCAINE HCL 1 % IJ SOLN
INTRAMUSCULAR | Status: AC
Start: 2022-02-28 — End: 2022-02-28
  Filled 2022-02-28: qty 20

## 2022-02-28 NOTE — Procedures (Signed)
PROCEDURE SUMMARY:  Successful ultrasound guided diagnostic and therapeutic paracentesis from the LLQ. Yielded 7.1 L of clear, yellow fluid.  No immediate complications.  The patient tolerated the procedure well.   Specimen was sent for labs.  EBL < 59m  The patient has previously been formally evaluated by the [facility] and is being actively followed for potential future intervention.    SNarda Rutherford AGNP-BC 02/28/2022, 9:34 AM

## 2022-03-01 ENCOUNTER — Telehealth: Payer: Medicare HMO

## 2022-03-01 ENCOUNTER — Other Ambulatory Visit: Payer: Self-pay | Admitting: Student

## 2022-03-01 LAB — PATHOLOGIST SMEAR REVIEW

## 2022-03-01 NOTE — Pre-Procedure Instructions (Signed)
Surgical Instructions    Your procedure is scheduled on Tuesday, May 30th.  Report to [facility] Main Entrance "A" at 7:00 A.M., then check in with the Admitting office.  Call this number if you have problems the morning of surgery:  [phone]   If you have any questions prior to your surgery date call [phone]: Open Monday-Friday 8am-4pm    Remember:  Do not eat after midnight the night before your surgery  You may drink clear liquids until 6:00 a.m. the morning of your surgery.   Clear liquids allowed are: Water, Non-Citrus Juices (without pulp), Carbonated Beverages, Clear Tea, Black Coffee ONLY (NO MILK, CREAM OR POWDERED CREAMER of any kind), and Gatorade    Take these medicines the morning of surgery with A SIP OF WATER:  carvedilol (COREG)  escitalopram (LEXAPRO)  finasteride (PROSCAR)  gabapentin (NEURONTIN)  pantoprazole (PROTONIX)  rifaximin (XIFAXAN)  rosuvastatin (CRESTOR) tamsulosin (FLOMAX)   As needed: busPIRone (BUSPAR)  Albuterol inhaler-please bring with you to the hospital oxyCODONE-acetaminophen (PERCOCET)  As of today, STOP taking any Aspirin (unless otherwise instructed by your surgeon) Aleve, Naproxen, Ibuprofen, Motrin, Advil, Goody's, BC's, all herbal medications, fish oil, and all vitamins.           Do not wear jewelry Do not wear lotions, powders, colognes, or deodorant. Men may shave face and neck. Do not bring valuables to the hospital.  [facility] is not responsible for any belongings or valuables. .   Do NOT Smoke (Tobacco/Vaping)  24 hours prior to your procedure  If you use a CPAP at night, you may bring your mask for your overnight stay.   Contacts, glasses, hearing aids, dentures or partials may not be worn into surgery, please bring cases for these belongings   For patients admitted to the hospital, discharge time will be determined by your treatment team.   Patients discharged the day of surgery will not be allowed to  drive home, and someone needs to stay with them for 24 hours.   SURGICAL WAITING ROOM VISITATION Patients having surgery or a procedure in a hospital may have two support people. Children under the age of 79 must have an adult with them who is not the patient. They may stay in the waiting area during the procedure and may switch out with other visitors. If the patient needs to stay at the hospital during part of their recovery, the visitor guidelines for inpatient rooms apply.  Please refer to the [facility] website for the visitor guidelines for Inpatients (after your surgery is over and you are in a regular room).       Special instructions:    Oral Hygiene is also important to reduce your risk of infection.  Remember - BRUSH YOUR TEETH THE MORNING OF SURGERY WITH YOUR REGULAR TOOTHPASTE   [facility]- Preparing For Surgery  Before surgery, you can play an important role. Because skin is not sterile, your skin needs to be as free of germs as possible. You can reduce the number of germs on your skin by washing with CHG (chlorahexidine gluconate) Soap before surgery.  CHG is an antiseptic cleaner which kills germs and bonds with the skin to continue killing germs even after washing.     Please do not use if you have an allergy to CHG or antibacterial soaps. If your skin becomes reddened/irritated stop using the CHG.  Do not shave (including legs and underarms) for at least 48 hours prior to first CHG shower. It is  OK to shave your face.  Please follow these instructions carefully.     Shower the NIGHT BEFORE SURGERY and the MORNING OF SURGERY with CHG Soap.   If you chose to wash your hair, wash your hair first as usual with your normal shampoo. After you shampoo, rinse your hair and body thoroughly to remove the shampoo.  Then [employer] and genitals (private parts) with your normal soap and rinse thoroughly to remove soap.  After that Use CHG Soap as you would any other liquid soap.  You can apply CHG directly to the skin and wash gently with a scrungie or a clean washcloth.   Apply the CHG Soap to your body ONLY FROM THE NECK DOWN.  Do not use on open wounds or open sores. Avoid contact with your eyes, ears, mouth and genitals (private parts). Wash Face and genitals (private parts)  with your normal soap.   Wash thoroughly, paying special attention to the area where your surgery will be performed.  Thoroughly rinse your body with warm water from the neck down.  DO NOT shower/wash with your normal soap after using and rinsing off the CHG Soap.  Pat yourself dry with a CLEAN TOWEL.  Wear CLEAN PAJAMAS to bed the night before surgery  Place CLEAN SHEETS on your bed the night before your surgery  DO NOT SLEEP WITH PETS.   Day of Surgery:  Take a shower with CHG soap. Wear Clean/Comfortable clothing the morning of surgery Do not apply any deodorants/lotions.   Remember to brush your teeth WITH YOUR REGULAR TOOTHPASTE.    If you received a COVID test during your pre-op visit, it is requested that you wear a mask when out in public, stay away from anyone that may not be feeling well, and notify your surgeon if you develop symptoms. If you have been in contact with anyone that has tested positive in the last 10 days, please notify your surgeon.    Please read over the following fact sheets that you were given.

## 2022-03-02 ENCOUNTER — Inpatient Hospital Stay (HOSPITAL_COMMUNITY)
Admission: RE | Admit: 2022-03-02 | Discharge: 2022-03-02 | Disposition: A | Discharge status: Home / Self Care | Payer: Medicare HMO | Source: Ambulatory Visit

## 2022-03-02 ENCOUNTER — Telehealth: Payer: Self-pay | Admitting: Pharmacist

## 2022-03-02 NOTE — Progress Notes (Signed)
Chronic Care Management Pharmacy Assistant   Name: [name]  MRN: [id number] DOB: 12/04/1940  Reason for Encounter: General Adherence Call    Recent office visits:  None  Recent consult visits:  02/25/2022 OV (Podiatry) Marzetta Board, DPM; no medication changes indicated.  02/09/2022 OV (Gastroenterology) Hilarie Fredrickson Lajuan Lines, MD; no medication changes indicated.  02/07/2022 OV (Cardiology) Satira Sark, MD; no medication changes indicated.  Hospital visits:  02/03/2022 Admission due to Melena , Surgery Hemostasis Clip Placement -No medication changes indicated  Medications: Outpatient Encounter Medications as of 03/02/2022  Medication Sig Note   albuterol (VENTOLIN HFA) 108 (90 Base) MCG/ACT inhaler Inhale 2 puffs into the lungs every 6 (six) hours as needed for wheezing or shortness of breath.    busPIRone (BUSPAR) 7.5 MG tablet Take 1 tablet (7.5 mg total) by mouth 2 (two) times daily as needed. for anxiety (Patient taking differently: Take 7.5 mg by mouth See admin instructions. Take 7.5 mg at bedtime, may take a second 7.5 mg dose as needed for anxiety)    carvedilol (COREG) 6.25 MG tablet Take 1 tablet (6.25 mg total) by mouth 2 (two) times daily with a meal.    ciprofloxacin (CIPRO) 250 MG tablet Take 1 tablet (250 mg total) by mouth daily.    escitalopram (LEXAPRO) 5 MG tablet Take 1 tablet (5 mg total) by mouth daily.    finasteride (PROSCAR) 5 MG tablet TAKE 1 TABLET EVERY DAY    gabapentin (NEURONTIN) 300 MG capsule TAKE 1 CAPSULE TWICE DAILY (Patient taking differently: Take 300 mg by mouth 2 (two) times daily.)    oxyCODONE-acetaminophen (PERCOCET) 10-325 MG tablet Take 1 tablet by mouth every 8 (eight) hours as needed for pain (chronic pain in cancer patient with cirrhosis). (Patient taking differently: Take 0.5 tablets by mouth every 8 (eight) hours as needed for pain (chronic pain in cancer patient with cirrhosis).)    pantoprazole (PROTONIX) 40 MG tablet  Take 1 tablet (40 mg total) by mouth 2 (two) times daily. 02/02/2022: Hasnt started   rifaximin (XIFAXAN) 550 MG TABS tablet Take 1 tablet (550 mg total) by mouth 2 (two) times daily.    rosuvastatin (CRESTOR) 5 MG tablet TAKE 1 TABLET EVERY DAY    sucralfate (CARAFATE) 1 g tablet Take 1 tablet (1 g total) by mouth 4 (four) times daily.    tamsulosin (FLOMAX) 0.4 MG CAPS capsule TAKE 1 CAPSULE EVERY DAY (Patient taking differently: Take 0.4 mg by mouth daily.)    No facility-administered encounter medications on file as of 03/02/2022.   [place] for General Review Call   Chart Review:  Have there been any documented new, changed, or discontinued medications since last visit? No Has there been any documented recent hospitalizations or ED visits since last visit with Clinical Pharmacist? Yes   Adherence Review:  Does the Clinical Pharmacist Assistant have access to adherence rates? Yes Adherence rates for STAR metric medications: Rosuvastatin 5 mg last filled 02/10/2022 90 DS Entresto '24mg'$ -26 mg last filled 01/26/2022 90 DS Metformin 500 mg last filled 01/13/2022 90 DS Does the patient have >5 day gap between last estimated fill dates for any of the above medications or other medication gaps? No Reason for medication gaps.   Disease State Questions:  Able to connect with Patient? Yes Did patient have any problems with their health recently? Yes Note problems and Concerns: Patient states he has a scheduled surgery to "stop his liver from leaking" on 03/08/2022.  Have you had any admissions or emergency room visits or worsening of your condition(s) since last visit? Yes Details of ED visit, hospital visit and/or worsening condition(s): Patient was admitted for Hemostasis Clip Placement due to Melena and low hgb. Have you had any visits with new specialists or providers since your last visit? No Have you had any new health care problem(s) since your last visit? No Have you  run out of any of your medications since you last spoke with clinical pharmacist? No Are there any medications you are not taking as prescribed? No Are you having any issues or side effects with your medications? No Do you have any other health concerns or questions you want to discuss with your Clinical Pharmacist before your next visit? No Are there any health concerns that you feel we can do a better job addressing? No Are you having any problems with any of the following since the last visit:  None 12. Any falls since last visit? No 13. Any increased or uncontrolled pain since last visit? No  Care Gaps: Medicare Annual Wellness: Completed 09/2021 Ophthalmology Exam: Overdue since 01/25/2019 Foot Exam: Next due on 05/14/2022 Hemoglobin A1C: 5.4% on 11/03/2021 Colonoscopy: Next due on 07/14/2022  Future Appointments  Date Time Provider [place]  03/08/2022  9:00 AM [facility]-IR 1 [facility]-IR [facility]  03/08/2022  3:00 PM [facility]-MR 1 [facility]-MRI [place]  03/10/2022 12:00 PM [facility] LAB [facility] None  03/10/2022 12:30 PM Brunetta Genera, MD [facility] None  03/10/2022  1:30 PM [facility] INFUSION [facility] None  03/16/2022  2:30 PM Ester[place], Amy S, PA-C [facility]  03/22/2022  3:20 PM [name], MD [facility]  03/24/2022  1:30 PM [facility] LAB [facility] None  03/24/2022  2:30 PM [facility] INFUSION [facility] None  05/03/2022  1:15 PM Marzetta Board, DPM [facility]  06/02/2022  4:00 PM Satira Sark, MD [facility]  07/25/2022  2:00 PM [facility] CCM PHARMACIST [facility]  09/23/2022  2:30 PM [facility] HEALTH COACH [facility]   Star Rating Drugs: Rosuvastatin 5 mg last filled 02/10/2022 90 DS Entresto '24mg'$ -26 mg last filled 01/26/2022 90 DS Metformin 500 mg last filled 01/13/2022 90 DS  April D Calhoun, [place] Pharmacist Assistant [phone]

## 2022-03-04 ENCOUNTER — Encounter (HOSPITAL_COMMUNITY): Payer: Self-pay | Admitting: Physician Assistant

## 2022-03-04 ENCOUNTER — Other Ambulatory Visit: Payer: Self-pay

## 2022-03-04 ENCOUNTER — Other Ambulatory Visit: Payer: Self-pay | Admitting: Radiology

## 2022-03-04 ENCOUNTER — Encounter (HOSPITAL_COMMUNITY): Payer: Self-pay | Admitting: Interventional Radiology

## 2022-03-04 DIAGNOSIS — N1832 Chronic kidney disease, stage 3b: Secondary | ICD-10-CM

## 2022-03-04 DIAGNOSIS — K7030 Alcoholic cirrhosis of liver without ascites: Secondary | ICD-10-CM

## 2022-03-04 NOTE — Anesthesia Preprocedure Evaluation (Signed)
Anesthesia Evaluation    Reviewed: Allergy & Precautions, Patient's Chart, lab work & pertinent test results, reviewed documented beta blocker date and time   Airway        Dental   Pulmonary COPD,  COPD inhaler, former smoker,  Quit smoking 1985, 89 pack year history          Cardiovascular hypertension, Pt. on medications and Pt. on home beta blockers +CHF (normal VLEF, grade 1 diastolic dysfunction)  + Valvular Problems/Murmurs (mild AS) AS   Echo 02/14/22 1. Left ventricular ejection fraction, by estimation, is 55 to 60%. The  left ventricle has normal function. The left ventricle has no regional  wall motion abnormalities. Left ventricular diastolic parameters are  consistent with Grade I diastolic  dysfunction (impaired relaxation).  2. Right ventricular systolic function is normal. The right ventricular  size is normal. Tricuspid regurgitation signal is inadequate for assessing  PA pressure.  3. The mitral valve is normal in structure. Trivial mitral valve  regurgitation. No evidence of mitral stenosis.  4. The aortic valve has an indeterminant number of cusps. Aortic valve  regurgitation is not visualized. Mild aortic valve stenosis. Aortic valve  area, by VTI measures 2.09 cm. Aortic valve mean gradient measures 13.0  mmHg. Aortic valve Vmax measures  2.39 m/s.  5. Aortic dilatation noted. There is mild dilatation of the aortic root,  measuring 39 mm. There is mild dilatation of the ascending aorta.    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  Last tap 03/01/22 7L   Endo/Other   diabetes, Type 2  Renal/GU Renal InsufficiencyRenal diseaseCr 1.78  negative genitourinary   Musculoskeletal  (+) Arthritis , Osteoarthritis,    Abdominal   Peds  Hematology  (+) Blood dyscrasia, anemia , Hb 9.8, plt 91   Anesthesia Other Findings   Reproductive/Obstetrics negative OB ROS                            Anesthesia Physical Anesthesia Plan  ASA: 3  Anesthesia Plan: General   Post-op Pain Management:    Induction: Intravenous  PONV Risk Score and Plan: 2 and Ondansetron, Dexamethasone and Treatment may vary due to age or medical condition  Airway Management Planned: Oral ETT  Additional Equipment: Arterial line  Intra-op Plan:   Post-operative Plan: Extubation in OR  Informed Consent:   Plan Discussed with:   Anesthesia Plan Comments:        Anesthesia Quick Evaluation

## 2022-03-04 NOTE — Progress Notes (Signed)
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  More recently he developed melena and anemia and underwent upper endoscopy with Dr. Silverio Decamp on 02/03/2022.  On Endoscopy, there were Grade I esophageal varices, portal hypertensive gastropahy, non bleeding gastric ulcer, and grade III duodenal ulcers.  He is requiring repeat paracentesis for recurrent ascites.  Most recently 7.1 L of clear, yellow fluid on 03/01/2022.  Follows with cardiology for history of aortic stenosis (mild by echo 5/23, mean gradient 13 mmHg) and HFrEF (LVEF now normalized 55 to 60% by echo 5/23).  Last seen by Dr. Domenic Polite 02/07/2022.  Per note, recommended continued observation of aortic stenosis.  There is also noted that he was unable to continue GDMT due to active comorbidities including hyperkalemia and relatively low blood pressure.  He is currently maintained on Coreg.  No longer on Aldactone, Entresto, or Lasix.  CMP from 02/24/2022 reviewed, creatinine elevated at 1.78 (appears to be near recent baseline).  CBC from 02/24/2022 reviewed, chronic anemia with hemoglobin 9.8 (up from 7.9 on 02/11/2022), platelets mildly low at 91k, stable.  Will need DOS evaluation.  EKG 12/14/2020: NSR.  Rate 65.  Incomplete right bundle branch block.  Nonspecific ST and T wave abnormality.  TTE 02/14/2022: 1. Left ventricular ejection fraction, by estimation, is 55 to 60%. The  left ventricle has normal function. The left ventricle has no regional  wall motion abnormalities. Left ventricular diastolic parameters are  consistent with Grade I diastolic  dysfunction (impaired relaxation).   2. Right ventricular systolic function is normal. The right ventricular  size is normal. Tricuspid regurgitation signal is inadequate for assessing  PA pressure.   3. The mitral valve is  normal in structure. Trivial mitral valve  regurgitation. No evidence of mitral stenosis.   4. The aortic valve has an indeterminant number of cusps. Aortic valve  regurgitation is not visualized. Mild aortic valve stenosis. Aortic valve  area, by VTI measures 2.09 cm. Aortic valve mean gradient measures 13.0  mmHg. Aortic valve Vmax measures  2.39 m/s.   5. Aortic dilatation noted. There is mild dilatation of the aortic root,  measuring 39 mm. There is mild dilatation of the ascending aorta.   [name] [facility]/Anesthesiology Phone [phone] 03/04/2022 11:53 AM

## 2022-03-04 NOTE — Progress Notes (Addendum)
PCP - [name], MD Cardiologist - Rozann Lesches, MD  PPM/ICD - denies  Chest x-ray - 11/02/21 EKG - 11/03/21 Stress Test - denies ECHO - 02/14/22 Cardiac Cath - denies  CPAP - n/a  Fasting Blood Sugar - patient is not checking CBG at home; patient is not on any Diabetes medication  Blood Thinner Instructions: n/a Aspirin Instructions: Patient was instructed: As of today, STOP taking any Aspirin (unless otherwise instructed by your surgeon) Aleve, Naproxen, Ibuprofen, Motrin, Advil, Goody's, BC's, all herbal medications, fish oil, and all vitamins.  ERAS Protcol - n/a  COVID TEST- n/a  Anesthesia review: yes, cardiac history  Patient verbally denies any shortness of breath, fever, cough and chest pain during phone call   -------------  SDW INSTRUCTIONS given:  Your procedure is scheduled on Tuesday, May 30th, 2023.  Report to [facility] Main Entrance "A" at 06:30 A.M., and check in at the Admitting office.  Call this number if you have problems the morning of surgery:  [phone]   Remember:  Do not eat or drink after midnight the night before your surgery    Take these medicines the morning of surgery with A SIP OF WATER Coreg, Lexapro, Proscar, Gabapentin, Protonix, Rifaximin, Crestor, Flomax, Carafate; PRN: Buspar, inhaler   The day of surgery:             Do not wear jewelry            Do not wear lotions, powders, colognes, or deodorant.            Men may shave face and neck.            Do not bring valuables to the hospital.            [facility] is not responsible for any belongings or valuables.  Do NOT Smoke (Tobacco/Vaping) 24 hours prior to your procedure If you use a CPAP at night, you may bring all equipment for your overnight stay.   Contacts, glasses, dentures or bridgework may not be worn into surgery.      For patients admitted to the hospital, discharge time will be determined by your treatment team.   Patients discharged the day of  surgery will not be allowed to drive home, and someone needs to stay with them for 24 hours.   Special instructions:   [place]- Preparing For Surgery  Before surgery, you can play an important role. Because skin is not sterile, your skin needs to be as free of germs as possible. You can reduce the number of germs on your skin by washing with CHG (chlorahexidine gluconate) Soap before surgery.  CHG is an antiseptic cleaner which kills germs and bonds with the skin to continue killing germs even after washing.    Oral Hygiene is also important to reduce your risk of infection.  Remember - BRUSH YOUR TEETH THE MORNING OF SURGERY WITH YOUR REGULAR TOOTHPASTE  Please do not use if you have an allergy to CHG or antibacterial soaps. If your skin becomes reddened/irritated stop using the CHG.  Do not shave (including legs and underarms) for at least 48 hours prior to first CHG shower. It is OK to shave your face.  Please follow these instructions carefully.   Shower the NIGHT BEFORE SURGERY and the MORNING OF SURGERY with DIAL Soap.   Pat yourself dry with a CLEAN TOWEL.  Wear CLEAN PAJAMAS to bed the night before surgery  Place CLEAN SHEETS on your bed  the night of your first shower and DO NOT SLEEP WITH PETS.   Day of Surgery: Please shower morning of surgery  Wear Clean/Comfortable clothing the morning of surgery Do not apply any deodorants/lotions.   Remember to brush your teeth WITH YOUR REGULAR TOOTHPASTE.   Questions were answered. Patient verbalized understanding of instructions.

## 2022-03-05 NOTE — Progress Notes (Signed)
  Subjective:  Patient ID: [name], male    DOB: 1941-01-08,  MRN: [id number]  [name] presents to clinic today for at risk foot care with history of diabetic neuropathy, painful thick toenails that are difficult to trim. Pain interferes with ambulation. Aggravating factors include wearing enclosed shoe gear. Pain is relieved with periodic professional debridement. He also has preulcerative lesion(s) submet head 1 right foot.  Patient states blood glucose was 141 mg/dl yesterday.  Last known HgA1c was around 8%. Patient did not check blood glucose today.  New problem(s): None.   PCP is [name], MD , and last visit was February 01, 2022.  No Known Allergies  Review of Systems: Negative except as noted in the HPI.  Objective: No changes noted in today's physical examination. General: Patient is a pleasant 81 y.o. Caucasian male obese in NAD. AAO x 3.   Neurovascular Examination: CFT <3 seconds b/l LE. Palpable DP/PT pulses b/l LE. Digital hair absent b/l. Skin temperature gradient WNL b/l. No pain with calf compression b/l. No edema noted b/l. No cyanosis or clubbing noted b/l LE.  Protective sensation diminished with 10g monofilament b/l.  Dermatological:  Pedal integument with normal turgor, texture and tone BLE. No open wounds b/l LE. No interdigital macerations noted b/l LE. Toenails 1-5 b/l elongated, discolored, dystrophic, thickened, crumbly with subungual debris and tenderness to dorsal palpation. Preulcerative lesion noted submet head 1 right foot. There is visible subdermal hemorrhage. There is no surrounding erythema, no edema, no drainage, no odor, no fluctuance.  Musculoskeletal:  Muscle strength 5/5 to all lower extremity muscle groups bilaterally. Hammertoe deformity noted 1-5 b/l. Has severe contracture of right 3rd digit. No open wound, no erythema, no edema, no drainage, no fluctuance. Utilizes cane for ambulation assistance.     Latest Ref Rng &  Units 11/03/2021    6:04 PM 07/13/2021    3:18 AM  Hemoglobin A1C  Hemoglobin-A1c 4.8 - 5.6 % 5.4   5.2     Assessment/Plan: 1. Pain due to onychomycosis of toenails of both feet   2. Pre-ulcerative calluses   3. History of diabetic ulcer of foot   4. Diabetic peripheral neuropathy associated with type 2 diabetes mellitus ([place])      -Patient was evaluated and treated. All patient's and/or POA's questions/concerns answered on today's visit. -Foot impressions were taken by Orthotics and Prosthetics staff on today for his diabetic shoes. -Mycotic toenails 1-5 bilaterally were debrided in length and girth with sterile nail nippers and dremel without incident. -Preulcerative lesion pared submet head 1 right foot. Total number pared=1. -Patient/POA to call should there be question/concern in the interim.   Return in about 9 weeks (around 04/29/2022).  Marzetta Board, DPM

## 2022-03-08 ENCOUNTER — Ambulatory Visit (HOSPITAL_COMMUNITY): Admission: RE | Admit: 2022-03-08 | Payer: Medicare HMO | Source: Ambulatory Visit

## 2022-03-08 ENCOUNTER — Telehealth (HOSPITAL_COMMUNITY): Payer: Self-pay | Admitting: Radiology

## 2022-03-08 ENCOUNTER — Other Ambulatory Visit: Payer: Self-pay

## 2022-03-08 ENCOUNTER — Inpatient Hospital Stay (HOSPITAL_COMMUNITY): Admission: RE | Admit: 2022-03-08 | Payer: Medicare HMO | Source: Ambulatory Visit | Admitting: Interventional Radiology

## 2022-03-08 ENCOUNTER — Inpatient Hospital Stay (HOSPITAL_COMMUNITY): Admission: RE | Admit: 2022-03-08 | Payer: Medicare HMO | Source: Ambulatory Visit

## 2022-03-08 DIAGNOSIS — C9110 Chronic lymphocytic leukemia of B-cell type not having achieved remission: Secondary | ICD-10-CM

## 2022-03-08 HISTORY — DX: Pneumonia, unspecified organism: J18.9

## 2022-03-08 SURGERY — IR WITH ANESTHESIA
Anesthesia: General

## 2022-03-08 NOTE — Telephone Encounter (Signed)
Patient scheduled for TIPS with Dr. Dwaine Gale on 5.30.23. Family member left message that the patient is sick with URI symptoms and fever. General anesthesia informed. Schedulers made aware that the patient will need to be scheduled for a later date.

## 2022-03-09 ENCOUNTER — Telehealth: Payer: Self-pay | Admitting: Family Medicine

## 2022-03-09 NOTE — Progress Notes (Signed)
HEMATOLOGY/ONCOLOGY CLINIC NOTE  Date of Service: 03/09/2022  Patient Care Team: [name], MD as PCP - General (Family Medicine) Satira Sark, MD as PCP - Cardiology (Cardiology) Marilynne Halsted, MD as Referring Physician (Ophthalmology) Brunetta Genera, MD as Consulting Physician (Hematology) Pyrtle, Lajuan Lines, MD as Consulting Physician (Gastroenterology) Marzetta Board, DPM as Consulting Physician (Podiatry) [name], [facility] (Pharmacist)  CHIEF COMPLAINTS/PURPOSE OF CONSULTATION:  F/u for continued evaluation and management of [place] and anemia.   HISTORY OF PRESENTING ILLNESS:  Please see previous note for details on initial presentation  INTERVAL HISTORY: [name] is a 81 y.o. male here for f/u for continued evaluation and management of [place] and anemia and to follow up after recent hospitalization. He reports He is doing well.  Patient notes no acute GI bleeding since his last clinic visit that he has noticed.  No new abdominal pain or distention.  No fever, chills, night sweats. No new lumps, bumps, or lesions/rashes. No abdominal pain or change in bowel habits. No new or unexpected weight loss. No SOB or chest pain. No other new or acute focal symptoms. Lab results from today were discussed in detail with the patient. MRI of the liver pending on 03/16/2022 He is needing recurrent paracentesis and was scheduled for TIPS procedure which she cancel and wants to reschedule.  MEDICAL HISTORY:  Past Medical History:  Diagnosis Date   Aortic atherosclerosis ([place])    Benign prostatic hypertrophy    Blood transfusion without reported diagnosis    CHF (congestive heart failure) ([place])    a. EF 20-25% by echo in 11/2019   COPD (chronic obstructive pulmonary disease) ([place]) 12/15/2009   FEV1 2.30 (70%) ratio 63 no better with B2 and DLCO 18/6 (73%) corrects to 106%   Degenerative joint disease    Depression    Diverticulosis    Duodenal  varices    Esophageal varices (HCC)    Essential hypertension    ACE inhibitor cough   Fatty liver    Gastric ulcer    Hepatic cirrhosis (HCC)    Hepatocellular carcinoma (HCC)    Hyperlipidemia    IDA (iron deficiency anemia)    Internal hemorrhoids    Liver cancer ([place])    Low back pain    Otitis externa 07/30/2012   Pneumonia    Portal hypertensive gastropathy ([place])    SBP (spontaneous bacterial peritonitis) ([place])    Sepsis ([place])    Splenomegaly    Thrombocytopenia ([place])    Type 2 diabetes mellitus ([place])    Ulcer of foot ([place])    Right foot    SURGICAL HISTORY: Past Surgical History:  Procedure Laterality Date   BIOPSY  07/14/2021   Procedure: BIOPSY;  Surgeon: Daryel November, MD;  Location: [place];  Service: Gastroenterology;;  EGD and COLON   BIOPSY  02/03/2022   Procedure: BIOPSY;  Surgeon: Mauri Pole, MD;  Location: [facility];  Service: Gastroenterology;;   COLONOSCOPY  2020   JMP-MAC-plenvu (good)-polyps-recall 1 yr   COLONOSCOPY WITH PROPOFOL N/A 07/14/2021   Procedure: COLONOSCOPY WITH PROPOFOL;  Surgeon: Daryel November, MD;  Location[facility];  Service: Gastroenterology;  Laterality: N/A;   ESOPHAGOGASTRODUODENOSCOPY (EGD) WITH PROPOFOL N/A 07/14/2021   Procedure: ESOPHAGOGASTRODUODENOSCOPY (EGD) WITH PROPOFOL;  Surgeon: Daryel November, MD;  Location: [place];  Service: Gastroenterology;  Laterality: N/A;   ESOPHAGOGASTRODUODENOSCOPY (EGD) WITH PROPOFOL N/A 02/03/2022   Procedure: ESOPHAGOGASTRODUODENOSCOPY (EGD) WITH PROPOFOL;  Surgeon: Silverio Decamp,  Venia Minks, MD;  Location: [facility];  Service: Gastroenterology;  Laterality: N/A;   EYE SURGERY     HEMOSTASIS CLIP PLACEMENT  02/03/2022   Procedure: HEMOSTASIS CLIP PLACEMENT;  Surgeon: Mauri Pole, MD;  Location: [facility];  Service: Gastroenterology;;   IR EMBO TUMOR ORGAN ISCHEMIA INFARCT INC GUIDE ROADMAPPING  06/01/2020   IR PARACENTESIS  08/30/2021   IR  PARACENTESIS  09/22/2021   IR PARACENTESIS  10/12/2021   IR PARACENTESIS  11/02/2021   IR PARACENTESIS  11/15/2021   IR PARACENTESIS  11/30/2021   IR PARACENTESIS  12/14/2021   IR PARACENTESIS  12/27/2021   IR PARACENTESIS  01/04/2022   IR PARACENTESIS  01/12/2022   IR PARACENTESIS  01/19/2022   IR PARACENTESIS  02/02/2022   IR PARACENTESIS  02/16/2022   IR PARACENTESIS  02/28/2022   IR RADIOLOGIST EVAL & MGMT  04/28/2020   IR RADIOLOGIST EVAL & MGMT  07/15/2020   IR RADIOLOGIST EVAL & MGMT  05/04/2021   NO PAST SURGERIES  1980    SOCIAL HISTORY: Social History   Socioeconomic History   Marital status: Unknown    Spouse name: Not on file   Number of children: Not on file   Years of education: Not on file   Highest education level: Not on file  Occupational History   Occupation: retired    Comment: maintenence work  Tobacco Use   Smoking status: Former    Packs/day: 4.00    Years: 20.00    Pack years: 80.00    Types: Cigarettes    Quit date: 09/18/1984    Years since quitting: 37.4   Smokeless tobacco: Never   Tobacco comments:    quit in 1980  Vaping Use   Vaping Use: Never used  Substance and Sexual Activity   Alcohol use: No    Comment: no drinking since 40 years plus   Drug use: No   Sexual activity: Yes  Other Topics Concern   Not on file  Social History Narrative   ** Merged History Encounter **       Married 53 years in 2015. 4 kids (2 sons) and 3 grandkids.    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            Social Determinants of Radio broadcast assistant Strain: Low Risk    Difficulty of Paying Living Expenses: Not hard at all  Food Insecurity: No Food Insecurity   Worried About Charity fundraiser in the Last Year: Never true   Arboriculturist in the Last Year: Never true  Transportation Needs: No Transportation Needs   Lack of Transportation  (Medical): No   Lack of Transportation (Non-Medical): No  Physical Activity: Inactive   Days of Exercise per Week: 0 days   Minutes of Exercise per Session: 0 min  Stress: No Stress Concern Present   Feeling of Stress : Only a little  Social Connections: Moderately Integrated   Frequency of Communication with Friends and Family: More than three times a week   Frequency of Social Gatherings with Friends and Family: More than three times a week   Attends Religious Services: More than 4 times per year   Active Member of [employer] or Organizations: No   Attends Archivist Meetings: Never   Marital Status: Married  Human resources officer  Violence: Not At Risk   Fear of Current or Ex-Partner: No   Emotionally Abused: No   Physically Abused: No   Sexually Abused: No    FAMILY HISTORY: Family History  Problem Relation Age of Onset   Melanoma Mother    Stroke Father    CAD Brother        Premature disease   Colon polyps Neg Hx    Colon cancer Neg Hx    Esophageal cancer Neg Hx    Stomach cancer Neg Hx    Rectal cancer Neg Hx     ALLERGIES:  has No Known Allergies.  MEDICATIONS:  Current Outpatient Medications  Medication Sig Dispense Refill   albuterol (VENTOLIN HFA) 108 (90 Base) MCG/ACT inhaler Inhale 2 puffs into the lungs every 6 (six) hours as needed for wheezing or shortness of breath. 18 g 3   busPIRone (BUSPAR) 7.5 MG tablet Take 1 tablet (7.5 mg total) by mouth 2 (two) times daily as needed. for anxiety (Patient taking differently: Take 7.5 mg by mouth See admin instructions. Take 7.5 mg at bedtime, may take a second 7.5 mg dose as needed for anxiety) 180 tablet 3   carvedilol (COREG) 6.25 MG tablet Take 1 tablet (6.25 mg total) by mouth 2 (two) times daily with a meal. 180 tablet 3   ciprofloxacin (CIPRO) 250 MG tablet Take 1 tablet (250 mg total) by mouth daily. 90 tablet 2   escitalopram (LEXAPRO) 5 MG tablet Take 1 tablet (5 mg total) by mouth daily. 90 tablet 3    finasteride (PROSCAR) 5 MG tablet TAKE 1 TABLET EVERY DAY 90 tablet 1   gabapentin (NEURONTIN) 300 MG capsule TAKE 1 CAPSULE TWICE DAILY (Patient taking differently: Take 300 mg by mouth 2 (two) times daily.) 180 capsule 3   pantoprazole (PROTONIX) 40 MG tablet Take 1 tablet (40 mg total) by mouth 2 (two) times daily. 180 tablet 3   rifaximin (XIFAXAN) 550 MG TABS tablet Take 1 tablet (550 mg total) by mouth 2 (two) times daily. 30 tablet 4   rosuvastatin (CRESTOR) 5 MG tablet TAKE 1 TABLET EVERY DAY 90 tablet 3   sucralfate (CARAFATE) 1 g tablet Take 1 tablet (1 g total) by mouth 4 (four) times daily. 120 tablet 1   tamsulosin (FLOMAX) 0.4 MG CAPS capsule TAKE 1 CAPSULE EVERY DAY (Patient taking differently: Take 0.4 mg by mouth daily.) 90 capsule 1   No current facility-administered medications for this visit.    PHYSICAL EXAMINATION: Vitals:   03/10/22 1240  BP: 98/60  Pulse: 99  Resp: 18  Temp: 97.6 F (36.4 C)  SpO2: 99%   NAD GENERAL:alert, in no acute distress and comfortable SKIN: no acute rashes, no significant lesions EYES: conjunctiva are pink and non-injected, sclera anicteric NECK: supple, no JVD LYMPH:  no palpable lymphadenopathy in the cervical, axillary or inguinal regions LUNGS: clear to auscultation b/l with normal respiratory effort HEART: regular rate & rhythm ABDOMEN:  normoactive bowel sounds , non tender, not distended. Extremity: no pedal edema PSYCH: alert & oriented x 3 with fluent speech NEURO: no focal motor/sensory deficits  Exam performed in chair.  LABORATORY DATA:  I have reviewed the data as listed .    Latest Ref Rng & Units 03/10/2022   12:00 PM 02/24/2022   10:41 AM 02/11/2022   11:24 AM  CBC  WBC 4.0 - 10.5 K/uL 6.6   3.8   4.1    Hemoglobin 13.0 - 17.0 g/dL 9.6  9.8   7.9    Hematocrit 39.0 - 52.0 % 29.9   30.1   26.1    Platelets 150 - 400 K/uL 115   91   102     .    Latest Ref Rng & Units 03/10/2022   12:00 PM 02/24/2022   10:41  AM 02/11/2022   11:24 AM  CMP  Glucose 70 - 99 mg/dL 153   121   169    BUN 8 - 23 mg/dL 40   33   34    Creatinine 0.61 - 1.24 mg/dL 1.38   1.78   2.03    Sodium 135 - 145 mmol/L 138   136   135    Potassium 3.5 - 5.1 mmol/L 4.1   4.7   4.7    Chloride 98 - 111 mmol/L 108   107   107    CO2 22 - 32 mmol/L _0 Calcium 8.9 - 10.3 mg/dL 8.6   8.6   8.3    Total Protein 6.5 - 8.1 g/dL 5.3   5.7   5.3    Total Bilirubin 0.3 - 1.2 mg/dL 0.9   0.8   0.5    Alkaline Phos 38 - 126 U/L 107   114   100    AST 15 - 41 U/L _1 ALT 0 - 44 U/L _2 02/28/18 Surgical Pathology:   02/28/18 Cytogenetics:    RADIOGRAPHIC STUDIES: I have personally reviewed the radiological images as listed and agreed with the findings in the report.  MR Abdomen W Wo Contrast  Result Date: 11/15/2021 CLINICAL DATA:  Follow-up hepatocellular carcinoma. Evaluate treatment response. EXAM: MRI ABDOMEN WITHOUT AND WITH CONTRAST TECHNIQUE: Multiplanar multisequence MR imaging of the abdomen was performed both before and after the administration of intravenous contrast. CONTRAST:  59m GADAVIST GADOBUTROL 1 MMOL/ML IV SOLN COMPARISON:  07/01/2021 FINDINGS: Lower chest: No acute findings. Hepatobiliary: Hepatic cirrhosis again demonstrated. Post treatment changes again seen in segment 4. A lesion showing thin peripheral rim enhancement is again seen in segment 4A of the left hepatic lobe, measuring 2.0 x 1.2 cm on image 37/20. This is unchanged since previous study. A small lesion in segment 3 also shows mild peripheral enhancement. This measures 10 x 9 mm on image 43/27, and shows decreased size and enhancement compared to previous study when it measured 1.6 x 1.5 cm. No new or enlarging liver masses identified. Prior cholecystectomy. No evidence of biliary obstruction. Pancreas: No mass or inflammatory changes. Spleen: Stable mild splenomegaly with length measuring approximately 14 cm. Stable  tiny benign cyst again seen in the anterior aspect of the spleen. Adrenals/Urinary Tract: No masses identified. Stable small right renal cyst. No evidence of hydronephrosis. Stomach/Bowel: Diverticulosis involving visualized portion of ascending and transverse colon, without evidence of diverticulitis in these regions. Otherwise unremarkable. Vascular/Lymphatic: No pathologically enlarged lymph nodes identified. Left upper quadrant portosystemic venous collaterals again seen, consistent with portal venous hypertension. No acute vascular findings. Other: Moderate ascites, without significant change since prior exam. Musculoskeletal:  No suspicious bone lesions identified. IMPRESSION: Stable treated lesion in segment 4A of the left hepatic lobe, showing thin peripheral rim enhancement. Decreased size and enhancement of previously seen mass in segment 3 of the left hepatic lobe. No evidence of new or enlarging liver  masses. 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 Moderate ascites, without significant change. Electronically Signed   By: Marlaine Hind M.D.   On: 11/15/2021 15:13    ASSESSMENT & PLAN:   81 y.o. male with  1. 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 02/28/18 surgical pathology report with no overt pathology, could not rule out MDS, and noted an increase in ring sideroblasts 02/28/18 BM cytogenetics report was unremarkable  2. 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 Hepatocellular Carcinoma -- likely related to liver cirrhosis -02/14/2020 CT Abd/Pel ([id number]) revealed "1. 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 2. 4 cm hypervascular mass in segment 4A of left hepatic lobe, and 8 mm hypervascular nodule in posterior right hepatic lobe. These are highly suspicious for hepatocellular carcinomas in the setting of cirrhosis. 3. No evidence of  metastatic disease." -03/27/2020 MRI Abdomen ([id number]) revealed "1. The dominant lesion of concern in the medial segment of the left hepatic lobe (segment 4 A) is diagnostic of hepatocellular carcinoma (LR 5). 2. 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 CBC hemoglobin and 9.6 with WBC count of 6.6k stable platelets of 115k CMP with stable chronic kidney disease creatinine is improved to 1.38 down from 1.78 Ferritin at 146 Iron sat ratio at 60%. -He continues to follow with Dr. Hilarie Fredrickson for continued evaluation and management of his liver cirrhosis and its complications including ascites. -No indication for PRBC transfusion at this time. --He continues to have issues with ascites requiring repeated paracentesis.   -Continue f/u with Dr. Billie Lade for management of liver cirrhosis -Patient wants to continue to take a conservative approach at this time. -MRI of liver scheduled for 03/16/2022   FOLLOW UP: Phone visit in 2 weeks to discuss MRI liver RTC with Dr Irene Limbo with labs in 10 weeks  .The total time spent in the appointment was 20 minutes* .  All of the patient's questions were answered with apparent satisfaction. The patient knows to call the clinic with any problems, questions or concerns.   Sullivan Lone MD MS AAHIVMS [facility] Hematology/Oncology Physician [facility]  .*Total Encounter Time as defined by the Centers for Medicare and Medicaid Services includes, in addition to the face-to-face time of a patient visit (documented in the note above) non-face-to-face time: obtaining and reviewing outside history,  ordering and reviewing medications, tests or procedures, care coordination (communications with other health care professionals or caregivers) and documentation in the medical record.  I, Melene Muller, am acting as scribe for Dr. Sullivan Lone, MD. .I have reviewed the above documentation for accuracy and completeness, and I agree with the above. Brunetta Genera MD

## 2022-03-09 NOTE — Telephone Encounter (Signed)
.   Encourage patient to contact the pharmacy for refills or they can request refills through [place]:  02/01/22  NEXT APPOINTMENT DATE: 03/22/22  MEDICATION:oxyCODONE-acetaminophen (PERCOCET) 10-325 MG tablet   Is the patient out of medication?   PHARMACY: [facility] [address] [place] [address] Phone:  [phone]  Fax:  [phone]      Let patient know to contact pharmacy at the end of the day to make sure medication is ready.  Please notify patient to allow 48-72 hours to process

## 2022-03-10 ENCOUNTER — Inpatient Hospital Stay: Payer: Medicare HMO

## 2022-03-10 ENCOUNTER — Inpatient Hospital Stay: Payer: Medicare HMO | Attending: Hematology | Admitting: Hematology

## 2022-03-10 ENCOUNTER — Other Ambulatory Visit: Payer: Self-pay

## 2022-03-10 VITALS — BP 98/60 | HR 99 | Temp 97.6°F | Resp 18 | Wt 174.4 lb

## 2022-03-10 DIAGNOSIS — D500 Iron deficiency anemia secondary to blood loss (chronic): Secondary | ICD-10-CM | POA: Diagnosis not present

## 2022-03-10 DIAGNOSIS — D649 Anemia, unspecified: Secondary | ICD-10-CM | POA: Insufficient documentation

## 2022-03-10 DIAGNOSIS — C9110 Chronic lymphocytic leukemia of B-cell type not having achieved remission: Secondary | ICD-10-CM

## 2022-03-10 DIAGNOSIS — C220 Liver cell carcinoma: Secondary | ICD-10-CM | POA: Diagnosis not present

## 2022-03-10 LAB — CMP (CANCER CENTER ONLY)
ALT: 8 U/L (ref 0–44)
AST: 23 U/L (ref 15–41)
Albumin: 2.7 g/dL — ABNORMAL LOW (ref 3.5–5.0)
Alkaline Phosphatase: 107 U/L (ref 38–126)
Anion gap: 4 — ABNORMAL LOW (ref 5–15)
BUN: 40 mg/dL — ABNORMAL HIGH (ref 8–23)
CO2: 26 mmol/L (ref 22–32)
Calcium: 8.6 mg/dL — ABNORMAL LOW (ref 8.9–10.3)
Chloride: 108 mmol/L (ref 98–111)
Creatinine: 1.38 mg/dL — ABNORMAL HIGH (ref 0.61–1.24)
GFR, Estimated: 51 mL/min — ABNORMAL LOW (ref >60)
Glucose, Bld: 153 mg/dL — ABNORMAL HIGH (ref 70–99)
Potassium: 4.1 mmol/L (ref 3.5–5.1)
Sodium: 138 mmol/L (ref 135–145)
Total Bilirubin: 0.9 mg/dL (ref 0.3–1.2)
Total Protein: 5.3 g/dL — ABNORMAL LOW (ref 6.5–8.1)

## 2022-03-10 LAB — CBC WITH DIFFERENTIAL (CANCER CENTER ONLY)
Abs Immature Granulocytes: 0.02 10*3/uL (ref 0.00–0.07)
Basophils Absolute: 0 10*3/uL (ref 0.0–0.1)
Basophils Relative: 1 %
Eosinophils Absolute: 0.1 10*3/uL (ref 0.0–0.5)
Eosinophils Relative: 2 %
HCT: 29.9 % — ABNORMAL LOW (ref 39.0–52.0)
Hemoglobin: 9.6 g/dL — ABNORMAL LOW (ref 13.0–17.0)
Immature Granulocytes: 0 %
Lymphocytes Relative: 12 %
Lymphs Abs: 0.8 10*3/uL (ref 0.7–4.0)
MCH: 30.8 pg (ref 26.0–34.0)
MCHC: 32.1 g/dL (ref 30.0–36.0)
MCV: 95.8 fL (ref 80.0–100.0)
Monocytes Absolute: 0.4 10*3/uL (ref 0.1–1.0)
Monocytes Relative: 6 %
Neutro Abs: 5.3 10*3/uL (ref 1.7–7.7)
Neutrophils Relative %: 79 %
Platelet Count: 115 10*3/uL — ABNORMAL LOW (ref 150–400)
RBC: 3.12 MIL/uL — ABNORMAL LOW (ref 4.22–5.81)
RDW: 18.7 % — ABNORMAL HIGH (ref 11.5–15.5)
WBC Count: 6.6 10*3/uL (ref 4.0–10.5)
nRBC: 0 % (ref 0.0–0.2)

## 2022-03-10 LAB — IRON AND IRON BINDING CAPACITY (CC-WL,HP ONLY)
Iron: 90 ug/dL (ref 45–182)
Saturation Ratios: 60 % — ABNORMAL HIGH (ref 17.9–39.5)
TIBC: 151 ug/dL — ABNORMAL LOW (ref 250–450)
UIBC: 61 ug/dL — ABNORMAL LOW (ref 117–376)

## 2022-03-10 LAB — SAMPLE TO BLOOD BANK

## 2022-03-10 LAB — FERRITIN: Ferritin: 146 ng/mL (ref 24–336)

## 2022-03-10 MED ORDER — OXYCODONE-ACETAMINOPHEN 10-325 MG PO TABS: 1.0000 | ORAL_TABLET | Freq: Three times a day (TID) | ORAL | 0 refills | Status: DC | PRN

## 2022-03-10 NOTE — Telephone Encounter (Signed)
Both Dr Hilarie Fredrickson and the patient have signed forms. Forms have been faxed to [facility] Patient Assistance for Xifaxan.

## 2022-03-10 NOTE — Progress Notes (Signed)
Per Dr. Irene Limbo - patient will not require transfusion today. Infusion made aware and appointment canceled.

## 2022-03-10 NOTE — Telephone Encounter (Signed)
Oxy not on current med list, ok for refill?

## 2022-03-11 ENCOUNTER — Telehealth: Payer: Self-pay | Admitting: Hematology

## 2022-03-11 ENCOUNTER — Other Ambulatory Visit: Payer: Self-pay | Admitting: Family Medicine

## 2022-03-11 NOTE — Telephone Encounter (Signed)
Scheduled follow-up appointment per 6/1 los. Patient is aware. Patient stated he would call back to schedule lab/follow-up in 10 weeks per 6/1 los.

## 2022-03-14 ENCOUNTER — Telehealth: Payer: Self-pay

## 2022-03-14 ENCOUNTER — Other Ambulatory Visit (HOSPITAL_COMMUNITY): Payer: Self-pay | Admitting: Internal Medicine

## 2022-03-14 ENCOUNTER — Other Ambulatory Visit: Payer: Self-pay

## 2022-03-14 ENCOUNTER — Telehealth: Payer: Self-pay | Admitting: Internal Medicine

## 2022-03-14 ENCOUNTER — Other Ambulatory Visit: Payer: Self-pay | Admitting: Internal Medicine

## 2022-03-14 DIAGNOSIS — K7031 Alcoholic cirrhosis of liver with ascites: Secondary | ICD-10-CM

## 2022-03-14 DIAGNOSIS — R188 Other ascites: Secondary | ICD-10-CM

## 2022-03-14 NOTE — Telephone Encounter (Signed)
Pt scheduled for IR Para at [facility] as ordered below for 03/15/22 at 75m pt to arrive there at 8:30am. Pts daughter aware of appt.

## 2022-03-14 NOTE — Telephone Encounter (Signed)
Inbound call from patients daughter stating she tried to called [facility] to have fluid drawn off of patients stomach and they advise daughter that she needed to call our office. Daughter is seeking advice on what she needs to do. Please advise.

## 2022-03-14 NOTE — Telephone Encounter (Signed)
Message sent to April Pait in radiology to see why this was not scheduled. Await reply.  Spoke with pts daughter and let her know she needs to call [phone] and request that they call IR due to him having standing orders that have been faxed. To IR. Requested she call me back if she has any issues.

## 2022-03-14 NOTE — Telephone Encounter (Signed)
Pyrtle pt calling requesting an IR para, last one done 5/22. As DOD please advise. Previous orders below:  Pyrtle pt with cirrhosis calling requesting IR para. Last one was done 5/10, he is scheduled for tips procedure 5/30. Previous orders below:   If therapeutic, is there a maximum amount of fluid to be removed? Yes  What is the maximum amount of fluide to be removed? 8 liters  Are labs required for specimen collection? Yes  Lab orders requested (DO NOT place separate lab orders, these will be automatically ordered during procedure specimen collection): Body Fluid Cell Count With Differential  Is Albumin medication needed? Yes  A seperate Albumin medication order is required. I understand I need to place a separate order for albumin  Reason for Exam (SYMPTOM  OR DIAGNOSIS REQUIRED) ascites  Note: Appropriate reason for exam on pre-op X-rays should indicate the reason for the surgery. Examples include: Coronary Artery disease, osteoarthritis of the knee, brain tumor, etc.  Preferred Imaging Location? [facility]          Please advise regarding IR Para as DOD.

## 2022-03-14 NOTE — Telephone Encounter (Signed)
LVP up to 8 L with IV albumin with 50 g of IV albumin - under Dr. Vena Rua name so results return to him  Fluid cell count, culture - under Dr. Vena Rua name so results return to him        Continue Cipro 400 mg daily for prophylaxis of SBP TIPS scheduled for 6/9 Further recommendations per Dr. Hilarie Fredrickson when he returns tomorrow

## 2022-03-15 ENCOUNTER — Ambulatory Visit (HOSPITAL_COMMUNITY)
Admission: RE | Admit: 2022-03-15 | Discharge: 2022-03-15 | Disposition: A | Discharge status: Home / Self Care | Payer: Medicare HMO | Source: Ambulatory Visit | Attending: Internal Medicine | Admitting: Internal Medicine

## 2022-03-15 DIAGNOSIS — F32A Depression, unspecified: Secondary | ICD-10-CM | POA: Diagnosis present

## 2022-03-15 DIAGNOSIS — Z8505 Personal history of malignant neoplasm of liver: Secondary | ICD-10-CM | POA: Diagnosis not present

## 2022-03-15 DIAGNOSIS — J449 Chronic obstructive pulmonary disease, unspecified: Secondary | ICD-10-CM | POA: Diagnosis present

## 2022-03-15 DIAGNOSIS — Z8619 Personal history of other infectious and parasitic diseases: Secondary | ICD-10-CM | POA: Diagnosis not present

## 2022-03-15 DIAGNOSIS — E119 Type 2 diabetes mellitus without complications: Secondary | ICD-10-CM | POA: Diagnosis present

## 2022-03-15 DIAGNOSIS — R188 Other ascites: Secondary | ICD-10-CM | POA: Diagnosis not present

## 2022-03-15 DIAGNOSIS — Z923 Personal history of irradiation: Secondary | ICD-10-CM | POA: Diagnosis not present

## 2022-03-15 DIAGNOSIS — Z8249 Family history of ischemic heart disease and other diseases of the circulatory system: Secondary | ICD-10-CM | POA: Diagnosis not present

## 2022-03-15 DIAGNOSIS — M47816 Spondylosis without myelopathy or radiculopathy, lumbar region: Secondary | ICD-10-CM | POA: Diagnosis not present

## 2022-03-15 DIAGNOSIS — K7031 Alcoholic cirrhosis of liver with ascites: Secondary | ICD-10-CM | POA: Diagnosis present

## 2022-03-15 DIAGNOSIS — K3189 Other diseases of stomach and duodenum: Secondary | ICD-10-CM | POA: Diagnosis present

## 2022-03-15 DIAGNOSIS — I8510 Secondary esophageal varices without bleeding: Secondary | ICD-10-CM | POA: Diagnosis present

## 2022-03-15 DIAGNOSIS — Z79899 Other long term (current) drug therapy: Secondary | ICD-10-CM | POA: Diagnosis not present

## 2022-03-15 DIAGNOSIS — I509 Heart failure, unspecified: Secondary | ICD-10-CM | POA: Diagnosis present

## 2022-03-15 DIAGNOSIS — Z87891 Personal history of nicotine dependence: Secondary | ICD-10-CM | POA: Diagnosis not present

## 2022-03-15 DIAGNOSIS — K7460 Unspecified cirrhosis of liver: Secondary | ICD-10-CM | POA: Diagnosis not present

## 2022-03-15 DIAGNOSIS — N400 Enlarged prostate without lower urinary tract symptoms: Secondary | ICD-10-CM | POA: Diagnosis present

## 2022-03-15 DIAGNOSIS — C220 Liver cell carcinoma: Secondary | ICD-10-CM | POA: Diagnosis not present

## 2022-03-15 DIAGNOSIS — K7581 Nonalcoholic steatohepatitis (NASH): Secondary | ICD-10-CM | POA: Diagnosis present

## 2022-03-15 DIAGNOSIS — K766 Portal hypertension: Secondary | ICD-10-CM | POA: Diagnosis present

## 2022-03-15 DIAGNOSIS — K449 Diaphragmatic hernia without obstruction or gangrene: Secondary | ICD-10-CM | POA: Diagnosis not present

## 2022-03-15 DIAGNOSIS — Z8711 Personal history of peptic ulcer disease: Secondary | ICD-10-CM | POA: Diagnosis not present

## 2022-03-15 DIAGNOSIS — E785 Hyperlipidemia, unspecified: Secondary | ICD-10-CM | POA: Diagnosis present

## 2022-03-15 DIAGNOSIS — I110 Hypertensive heart disease with heart failure: Secondary | ICD-10-CM | POA: Diagnosis present

## 2022-03-15 DIAGNOSIS — Z8701 Personal history of pneumonia (recurrent): Secondary | ICD-10-CM | POA: Diagnosis not present

## 2022-03-15 HISTORY — PX: IR PARACENTESIS: IMG2679

## 2022-03-15 LAB — BODY FLUID CELL COUNT WITH DIFFERENTIAL
Eos, Fluid: 0 %
Lymphs, Fluid: 60 %
Monocyte-Macrophage-Serous Fluid: 31 % — ABNORMAL LOW (ref 50–90)
Neutrophil Count, Fluid: 9 % (ref 0–25)
Total Nucleated Cell Count, Fluid: 69 cu mm (ref 0–1000)

## 2022-03-15 LAB — GRAM STAIN

## 2022-03-15 MED ORDER — ALBUMIN HUMAN 25 % IV SOLN
50.0000 g | Freq: Once | INTRAVENOUS | Status: AC
  Filled 2022-03-15: qty 200

## 2022-03-15 MED ORDER — LIDOCAINE HCL (PF) 1 % IJ SOLN
INTRAMUSCULAR | Status: DC | PRN
  Administered 2022-03-15: 10 mL

## 2022-03-15 MED ORDER — LIDOCAINE HCL 1 % IJ SOLN
INTRAMUSCULAR | Status: AC
  Filled 2022-03-15: qty 20

## 2022-03-15 MED ORDER — ALBUMIN HUMAN 25 % IV SOLN
INTRAVENOUS | Status: AC
  Administered 2022-03-15: 50 g via INTRAVENOUS
  Filled 2022-03-15: qty 200

## 2022-03-15 NOTE — Procedures (Signed)
PROCEDURE SUMMARY:  Successful US guided paracentesis from RLQ.  Yielded 4.5L of clear, yellow fluid.  No immediate complications.  Pt tolerated well.   Specimen was sent for labs.  EBL < 74m  TIPS planned for Friday 03/18/22  Tyrihanna Wingert PA-C 03/15/2022 12:58 PM

## 2022-03-16 ENCOUNTER — Encounter: Payer: Self-pay | Admitting: Hematology

## 2022-03-16 ENCOUNTER — Encounter (HOSPITAL_COMMUNITY): Payer: Self-pay | Admitting: Interventional Radiology

## 2022-03-16 ENCOUNTER — Other Ambulatory Visit: Payer: Self-pay

## 2022-03-16 ENCOUNTER — Ambulatory Visit: Payer: Medicare HMO | Admitting: Physician Assistant

## 2022-03-16 ENCOUNTER — Ambulatory Visit (HOSPITAL_COMMUNITY)
Admission: RE | Admit: 2022-03-16 | Discharge: 2022-03-16 | Disposition: A | Discharge status: Home / Self Care | Payer: Medicare HMO | Source: Ambulatory Visit | Attending: Hematology | Admitting: Hematology

## 2022-03-16 DIAGNOSIS — M47816 Spondylosis without myelopathy or radiculopathy, lumbar region: Secondary | ICD-10-CM | POA: Diagnosis not present

## 2022-03-16 DIAGNOSIS — C220 Liver cell carcinoma: Secondary | ICD-10-CM

## 2022-03-16 DIAGNOSIS — Z8505 Personal history of malignant neoplasm of liver: Secondary | ICD-10-CM | POA: Diagnosis not present

## 2022-03-16 DIAGNOSIS — K7460 Unspecified cirrhosis of liver: Secondary | ICD-10-CM | POA: Diagnosis not present

## 2022-03-16 DIAGNOSIS — K7031 Alcoholic cirrhosis of liver with ascites: Secondary | ICD-10-CM | POA: Diagnosis not present

## 2022-03-16 DIAGNOSIS — K449 Diaphragmatic hernia without obstruction or gangrene: Secondary | ICD-10-CM | POA: Diagnosis not present

## 2022-03-16 MED ORDER — GADOBUTROL 1 MMOL/ML IV SOLN
8.0000 mL | Freq: Once | INTRAVENOUS | Status: AC | PRN
Start: 2022-03-16 — End: 2022-03-16
  Administered 2022-03-16: 8 mL via INTRAVENOUS

## 2022-03-16 NOTE — Progress Notes (Signed)
PCP - Dr [name] Cardiologist - Dr Rozann Lesches  Chest x-ray - 11/02/21 (2V) EKG - DOS Stress Test - 07/22/05 ECHO - 02/14/22 Cardiac Cath - n/a  ICD Pacemaker/Loop - n/a  Sleep Study -  n/a CPAP - none  Diabetes - Patient does not take any diabetes meds.  Occasionally checks blood sugar.    Anesthesia review: Yes  STOP now taking any Aspirin (unless otherwise instructed by your surgeon), Aleve, Naproxen, Ibuprofen, Motrin, Advil, Goody's, BC's, all herbal medications, fish oil, and all vitamins.   Coronavirus Screening Do you have any of the following symptoms:  Cough yes/no: No Fever (>100.108F)  yes/no: No Runny nose yes/no: No Sore throat yes/no: No Difficulty breathing/shortness of breath  Yes  Have you traveled in the last 14 days and where? yes/no: No  Patient verbalized understanding of instructions that were given via phone.

## 2022-03-16 NOTE — Anesthesia Preprocedure Evaluation (Addendum)
Anesthesia Evaluation  Patient identified by MRN, date of birth, ID band Patient awake    Reviewed: Allergy & Precautions, NPO status , Patient's Chart, lab work & pertinent test results, reviewed documented beta blocker date and time   Airway Mallampati: III  TM Distance: >3 FB Neck ROM: Full    Dental  (+) Edentulous Upper, Edentulous Lower   Pulmonary pneumonia, resolved, COPD,  COPD inhaler, former smoker,    Pulmonary exam normal breath sounds clear to auscultation       Cardiovascular hypertension, Pt. on medications and Pt. on home beta blockers +CHF  Normal cardiovascular exam Rhythm:Regular Rate:Normal  EKG 03/18/22 NSR, LAD, incomplete RBBB, low voltage  Echo 02/14/22  1. Left ventricular ejection fraction, by estimation, is 55 to 60%. The left ventricle has normal function. The left ventricle has no regional wall motion abnormalities. Left ventricular diastolic parameters are consistent with Grade I diastolic dysfunction (impaired relaxation).  2. Right ventricular systolic function is normal. The right ventricular size is normal. Tricuspid regurgitation signal is inadequate for assessing PA pressure.  3. The mitral valve is normal in structure. Trivial mitral valve regurgitation. No evidence of mitral stenosis.  4. The aortic valve has an indeterminant number of cusps. Aortic valve regurgitation is not visualized. Mild aortic valve stenosis. Aortic valve area, by VTI measures 2.09 cm. Aortic valve mean gradient measures 13.0 mmHg. Aortic valve Vmax measures 2.39 m/s.  5. Aortic dilatation noted. There is mild dilatation of the aortic root, measuring 39 mm. There is mild dilatation of the ascending aorta   Neuro/Psych PSYCHIATRIC DISORDERS Anxiety Depression Peripheral neuropathy  Neuromuscular disease    GI/Hepatic PUD, GERD  Medicated and Controlled,(+) Cirrhosis   Esophageal Varices and ascites    , Hepatocellular  Ca   Endo/Other  diabetes, Well Controlled, Type 2Hyperlipidemia  Renal/GU Renal InsufficiencyRenal disease  negative genitourinary   Musculoskeletal  (+) Arthritis , Osteoarthritis,    Abdominal   Peds  Hematology  (+) Blood dyscrasia, anemia , Thrombocytopenia   Anesthesia Other Findings   Reproductive/Obstetrics                                                             Anesthesia Evaluation  Patient identified by MRN, date of birth, ID band Patient awake    Reviewed: Allergy & Precautions, NPO status , Patient's Chart, lab work & pertinent test results, reviewed documented beta blocker date and time   History of Anesthesia Complications Negative for: history of anesthetic complications  Airway Mallampati: III  TM Distance: >3 FB Neck ROM: Full    Dental  (+) Edentulous Upper, Edentulous Lower   Pulmonary COPD, former smoker,    Pulmonary exam normal        Cardiovascular hypertension, Pt. on home beta blockers and Pt. on medications +CHF  Normal cardiovascular exam  TTE 06/2020: EF 50-55%, mild LVH, grade I DD, severe LAE, mild to moderate AS   Neuro/Psych Anxiety Depression negative neurological ROS     GI/Hepatic GERD  Medicated and Controlled,(+) Cirrhosis       ,   Endo/Other  diabetes, Type 2, Oral Hypoglycemic Agents  Renal/GU Renal InsufficiencyRenal disease  negative genitourinary   Musculoskeletal  (+) Arthritis ,   Abdominal   Peds  Hematology  (+) Blood dyscrasia, anemia ,  Hgb 9.7, plt 81k   Anesthesia Other Findings Day of surgery medications reviewed with patient.  Reproductive/Obstetrics negative OB ROS                             Anesthesia Physical  Anesthesia Plan  ASA: 3  Anesthesia Plan: MAC   Post-op Pain Management: Minimal or no pain anticipated   Induction: Intravenous  PONV Risk Score and Plan: 1 and Treatment may vary due to age or medical  condition and Propofol infusion  Airway Management Planned: Natural Airway and Nasal Cannula  Additional Equipment: None  Intra-op Plan:   Post-operative Plan:   Informed Consent: I have reviewed the patients History and Physical, chart, labs and discussed the procedure including the risks, benefits and alternatives for the proposed anesthesia with the patient or authorized representative who has indicated his/her understanding and acceptance.       Plan Discussed with: CRNA and Anesthesiologist  Anesthesia Plan Comments:         Anesthesia Quick Evaluation  Anesthesia Physical Anesthesia Plan  ASA: 3  Anesthesia Plan: General   Post-op Pain Management: Minimal or no pain anticipated   Induction: Intravenous  PONV Risk Score and Plan:   Airway Management Planned: Oral ETT  Additional Equipment: Arterial line  Intra-op Plan:   Post-operative Plan: Extubation in OR  Informed Consent: I have reviewed the patients History and Physical, chart, labs and discussed the procedure including the risks, benefits and alternatives for the proposed anesthesia with the patient or authorized representative who has indicated his/her understanding and acceptance.     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 Silverio Decamp on 02/03/2022. On Endoscopy, there were Grade I esophageal varices, portal hypertensive gastropahy, non bleeding gastric ulcer, and grade III duodenal ulcers.  He is requiring repeat paracentesis for recurrent ascites.  Most recently 4.5 L of clear, yellow fluid on 03/15/2022.  Follows with cardiology for history of  aortic stenosis (mild by echo 5/23, mean gradient 13 mmHg) and HFrEF (LVEF now normalized 55 to 60% by echo 5/23).  Last seen by Dr. Domenic Polite 02/07/2022.  Per note, recommended continued observation of aortic stenosis.  There is also noted that he was unable to continue GDMT due to active comorbidities including hyperkalemia and relatively low blood pressure.  He is currently maintained on Coreg.  No longer on Aldactone, Entresto, or Lasix.  CMP from 03/10/22 reviewed, creatinine mildly elevated at 1.38, improved from recent.   CBC from 03/10/22 reviewed, chronic anemia with hemoglobin 9.6, platelets mildly low at 115k, stable.  Will need DOS evaluation.  EKG 12/14/2020: NSR.  Rate 65.  Incomplete right bundle branch block.  Nonspecific ST and T wave abnormality.  TTE 02/14/2022: 1. Left ventricular ejection fraction, by estimation, is 55 to 60%. The  left ventricle has normal function. The left ventricle has no regional  wall motion abnormalities. Left ventricular diastolic parameters are  consistent with Grade I diastolic  dysfunction (impaired relaxation).  2. Right ventricular systolic function is normal. The right ventricular  size is normal. Tricuspid regurgitation signal is inadequate for assessing  PA pressure.  3. The mitral valve is normal in structure. Trivial mitral  valve  regurgitation. No evidence of mitral stenosis.  4. The aortic valve has an indeterminant number of cusps. Aortic valve  regurgitation is not visualized. Mild aortic valve stenosis. Aortic valve  area, by VTI measures 2.09 cm. Aortic valve mean gradient measures 13.0  mmHg. Aortic valve Vmax measures  2.39 m/s.  5. Aortic dilatation noted. There is mild dilatation of the aortic root,  measuring 39 mm. There is mild dilatation of the ascending aorta. )      Anesthesia Quick Evaluation                                  Anesthesia Evaluation  Patient identified by MRN, date of birth, ID band Patient  awake    Reviewed: Allergy & Precautions, NPO status , Patient's Chart, lab work & pertinent test results, reviewed documented beta blocker date and time   History of Anesthesia Complications Negative for: history of anesthetic complications  Airway Mallampati: III  TM Distance: >3 FB Neck ROM: Full    Dental  (+) Edentulous Upper, Edentulous Lower   Pulmonary COPD, former smoker,    Pulmonary exam normal        Cardiovascular hypertension, Pt. on home beta blockers and Pt. on medications +CHF  Normal cardiovascular exam  TTE 06/2020: EF 50-55%, mild LVH, grade I DD, severe LAE, mild to moderate AS   Neuro/Psych Anxiety Depression negative neurological ROS     GI/Hepatic GERD  Medicated and Controlled,(+) Cirrhosis       ,   Endo/Other  diabetes, Type 2, Oral Hypoglycemic Agents  Renal/GU Renal InsufficiencyRenal disease  negative genitourinary   Musculoskeletal  (+) Arthritis ,   Abdominal   Peds  Hematology  (+) Blood dyscrasia, anemia , Hgb 9.7, plt 81k   Anesthesia Other Findings Day of surgery medications reviewed with patient.  Reproductive/Obstetrics negative OB ROS                             Anesthesia Physical  Anesthesia Plan  ASA: 3  Anesthesia Plan: MAC   Post-op Pain Management: Minimal or no pain anticipated   Induction: Intravenous  PONV Risk Score and Plan: 1 and Treatment may vary due to age or medical condition and Propofol infusion  Airway Management Planned: Natural Airway and Nasal Cannula  Additional Equipment: None  Intra-op Plan:   Post-operative Plan:   Informed Consent: I have reviewed the patients History and Physical, chart, labs and discussed the procedure including the risks, benefits and alternatives for the proposed anesthesia with the patient or authorized representative who has indicated his/her understanding and acceptance.       Plan Discussed with: CRNA and  Anesthesiologist  Anesthesia Plan Comments:         Anesthesia Quick Evaluation

## 2022-03-16 NOTE — Progress Notes (Signed)
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  More recently he developed melena and anemia and underwent upper endoscopy with Dr. Silverio Decamp on 02/03/2022.  On Endoscopy, there were Grade I esophageal varices, portal hypertensive gastropahy, non bleeding gastric ulcer, and grade III duodenal ulcers.  He is requiring repeat paracentesis for recurrent ascites.  Most recently 4.5 L of clear, yellow fluid on 03/15/2022.   Follows with cardiology for history of aortic stenosis (mild by echo 5/23, mean gradient 13 mmHg) and HFrEF (LVEF now normalized 55 to 60% by echo 5/23).  Last seen by Dr. Domenic Polite 02/07/2022.  Per note, recommended continued observation of aortic stenosis.  There is also noted that he was unable to continue GDMT due to active comorbidities including hyperkalemia and relatively low blood pressure.  He is currently maintained on Coreg.  No longer on Aldactone, Entresto, or Lasix.   CMP from 03/10/22 reviewed, creatinine mildly elevated at 1.38, down from previous 1.78 on 02/24/2022.   CBC from 03/10/22 reviewed, chronic anemia with hemoglobin 9.6, platelets mildly low at 115k, stable.   Will need DOS evaluation.   EKG 12/14/2020: NSR.  Rate 65.  Incomplete right bundle branch block.  Nonspecific ST and T wave abnormality.   TTE 02/14/2022: 1. Left ventricular ejection fraction, by estimation, is 55 to 60%. The  left ventricle has normal function. The left ventricle has no regional  wall motion abnormalities. Left ventricular diastolic parameters are  consistent with Grade I diastolic  dysfunction (impaired relaxation).   2. Right ventricular systolic function is normal. The right ventricular  size is normal. Tricuspid regurgitation signal is inadequate for assessing  PA pressure.   3. The mitral valve is normal in  structure. Trivial mitral valve  regurgitation. No evidence of mitral stenosis.   4. The aortic valve has an indeterminant number of cusps. Aortic valve  regurgitation is not visualized. Mild aortic valve stenosis. Aortic valve  area, by VTI measures 2.09 cm. Aortic valve mean gradient measures 13.0  mmHg. Aortic valve Vmax measures  2.39 m/s.   5. Aortic dilatation noted. There is mild dilatation of the aortic root,  measuring 39 mm. There is mild dilatation of the ascending aorta.    Wynonia Musty [facility]/Anesthesiology Phone [phone] 03/16/2022 9:50 AM

## 2022-03-17 ENCOUNTER — Other Ambulatory Visit: Payer: Self-pay | Admitting: Radiology

## 2022-03-18 ENCOUNTER — Inpatient Hospital Stay (HOSPITAL_COMMUNITY): Payer: Medicare HMO | Admitting: Physician Assistant

## 2022-03-18 ENCOUNTER — Encounter (HOSPITAL_COMMUNITY)
Admission: RE | Disposition: A | Discharge status: Home / Self Care | Payer: Self-pay | Source: Ambulatory Visit | Attending: Interventional Radiology

## 2022-03-18 ENCOUNTER — Inpatient Hospital Stay (HOSPITAL_COMMUNITY)
Admission: RE | Admit: 2022-03-18 | Discharge: 2022-03-19 | DRG: 406 | Disposition: A | Discharge status: Home / Self Care | Payer: Medicare HMO | Source: Ambulatory Visit | Attending: Interventional Radiology | Admitting: Interventional Radiology

## 2022-03-18 ENCOUNTER — Inpatient Hospital Stay (HOSPITAL_BASED_OUTPATIENT_CLINIC_OR_DEPARTMENT_OTHER): Payer: Medicare HMO | Admitting: Physician Assistant

## 2022-03-18 ENCOUNTER — Observation Stay (HOSPITAL_COMMUNITY)
Admission: RE | Admit: 2022-03-18 | Discharge: 2022-03-18 | Disposition: A | Discharge status: Home / Self Care | Payer: Medicare HMO | Source: Ambulatory Visit | Attending: Interventional Radiology | Admitting: Interventional Radiology

## 2022-03-18 ENCOUNTER — Other Ambulatory Visit: Payer: Self-pay

## 2022-03-18 ENCOUNTER — Encounter (HOSPITAL_COMMUNITY): Payer: Self-pay | Admitting: Interventional Radiology

## 2022-03-18 DIAGNOSIS — K766 Portal hypertension: Secondary | ICD-10-CM | POA: Diagnosis present

## 2022-03-18 DIAGNOSIS — I8510 Secondary esophageal varices without bleeding: Secondary | ICD-10-CM | POA: Diagnosis present

## 2022-03-18 DIAGNOSIS — N1832 Chronic kidney disease, stage 3b: Secondary | ICD-10-CM

## 2022-03-18 DIAGNOSIS — K7460 Unspecified cirrhosis of liver: Secondary | ICD-10-CM

## 2022-03-18 DIAGNOSIS — Z87891 Personal history of nicotine dependence: Secondary | ICD-10-CM

## 2022-03-18 DIAGNOSIS — J449 Chronic obstructive pulmonary disease, unspecified: Secondary | ICD-10-CM | POA: Diagnosis present

## 2022-03-18 DIAGNOSIS — I509 Heart failure, unspecified: Secondary | ICD-10-CM | POA: Diagnosis present

## 2022-03-18 DIAGNOSIS — Z79899 Other long term (current) drug therapy: Secondary | ICD-10-CM

## 2022-03-18 DIAGNOSIS — F32A Depression, unspecified: Secondary | ICD-10-CM | POA: Diagnosis present

## 2022-03-18 DIAGNOSIS — Z8505 Personal history of malignant neoplasm of liver: Secondary | ICD-10-CM

## 2022-03-18 DIAGNOSIS — R188 Other ascites: Secondary | ICD-10-CM | POA: Diagnosis not present

## 2022-03-18 DIAGNOSIS — N400 Enlarged prostate without lower urinary tract symptoms: Secondary | ICD-10-CM | POA: Diagnosis present

## 2022-03-18 DIAGNOSIS — K7031 Alcoholic cirrhosis of liver with ascites: Secondary | ICD-10-CM | POA: Diagnosis present

## 2022-03-18 DIAGNOSIS — Z95828 Presence of other vascular implants and grafts: Principal | ICD-10-CM

## 2022-03-18 DIAGNOSIS — Z8711 Personal history of peptic ulcer disease: Secondary | ICD-10-CM

## 2022-03-18 DIAGNOSIS — Z8701 Personal history of pneumonia (recurrent): Secondary | ICD-10-CM | POA: Diagnosis not present

## 2022-03-18 DIAGNOSIS — I110 Hypertensive heart disease with heart failure: Secondary | ICD-10-CM

## 2022-03-18 DIAGNOSIS — K7030 Alcoholic cirrhosis of liver without ascites: Secondary | ICD-10-CM

## 2022-03-18 DIAGNOSIS — E119 Type 2 diabetes mellitus without complications: Secondary | ICD-10-CM | POA: Diagnosis present

## 2022-03-18 DIAGNOSIS — Z923 Personal history of irradiation: Secondary | ICD-10-CM | POA: Diagnosis not present

## 2022-03-18 DIAGNOSIS — Z8619 Personal history of other infectious and parasitic diseases: Secondary | ICD-10-CM | POA: Diagnosis not present

## 2022-03-18 DIAGNOSIS — K3189 Other diseases of stomach and duodenum: Secondary | ICD-10-CM | POA: Diagnosis present

## 2022-03-18 DIAGNOSIS — K7581 Nonalcoholic steatohepatitis (NASH): Secondary | ICD-10-CM | POA: Diagnosis present

## 2022-03-18 DIAGNOSIS — E785 Hyperlipidemia, unspecified: Secondary | ICD-10-CM | POA: Diagnosis present

## 2022-03-18 DIAGNOSIS — Z8249 Family history of ischemic heart disease and other diseases of the circulatory system: Secondary | ICD-10-CM | POA: Diagnosis not present

## 2022-03-18 DIAGNOSIS — C220 Liver cell carcinoma: Secondary | ICD-10-CM | POA: Diagnosis not present

## 2022-03-18 HISTORY — PX: IR PARACENTESIS: IMG2679

## 2022-03-18 HISTORY — PX: IR INTRAVASCULAR ULTRASOUND NON CORONARY: IMG6085

## 2022-03-18 HISTORY — PX: RADIOLOGY WITH ANESTHESIA: SHX6223

## 2022-03-18 HISTORY — PX: IR US GUIDE VASC ACCESS RIGHT: IMG2390

## 2022-03-18 HISTORY — PX: IR TIPS: IMG2295

## 2022-03-18 LAB — CBC
HCT: 29.9 % — ABNORMAL LOW (ref 39.0–52.0)
Hemoglobin: 9.4 g/dL — ABNORMAL LOW (ref 13.0–17.0)
MCH: 31.1 pg (ref 26.0–34.0)
MCHC: 31.4 g/dL (ref 30.0–36.0)
MCV: 99 fL (ref 80.0–100.0)
Platelets: 93 10*3/uL — ABNORMAL LOW (ref 150–400)
RBC: 3.02 MIL/uL — ABNORMAL LOW (ref 4.22–5.81)
RDW: 19.1 % — ABNORMAL HIGH (ref 11.5–15.5)
WBC: 3.6 10*3/uL — ABNORMAL LOW (ref 4.0–10.5)
nRBC: 0 % (ref 0.0–0.2)

## 2022-03-18 LAB — GLUCOSE, CAPILLARY
Glucose-Capillary: 93 mg/dL (ref 70–99)
Glucose-Capillary: 85 mg/dL (ref 70–99)
Glucose-Capillary: 95 mg/dL (ref 70–99)
Glucose-Capillary: 92 mg/dL (ref 70–99)

## 2022-03-18 LAB — COMPREHENSIVE METABOLIC PANEL
ALT: 12 U/L (ref 0–44)
AST: 25 U/L (ref 15–41)
Albumin: 2.7 g/dL — ABNORMAL LOW (ref 3.5–5.0)
Alkaline Phosphatase: 103 U/L (ref 38–126)
Anion gap: 5 (ref 5–15)
BUN: 41 mg/dL — ABNORMAL HIGH (ref 8–23)
CO2: 24 mmol/L (ref 22–32)
Calcium: 8.5 mg/dL — ABNORMAL LOW (ref 8.9–10.3)
Chloride: 108 mmol/L (ref 98–111)
Creatinine, Ser: 1.69 mg/dL — ABNORMAL HIGH (ref 0.61–1.24)
GFR, Estimated: 40 mL/min — ABNORMAL LOW (ref >60)
Glucose, Bld: 105 mg/dL — ABNORMAL HIGH (ref 70–99)
Potassium: 4.1 mmol/L (ref 3.5–5.1)
Sodium: 137 mmol/L (ref 135–145)
Total Bilirubin: 0.8 mg/dL (ref 0.3–1.2)
Total Protein: 5.4 g/dL — ABNORMAL LOW (ref 6.5–8.1)

## 2022-03-18 LAB — APTT: aPTT: 30 seconds (ref 24–36)

## 2022-03-18 LAB — TYPE AND SCREEN
ABO/RH(D): A NEG
Antibody Screen: NEGATIVE

## 2022-03-18 LAB — PROTIME-INR
INR: 1.2 (ref 0.8–1.2)
Prothrombin Time: 15.2 seconds (ref 11.4–15.2)

## 2022-03-18 LAB — PATHOLOGIST SMEAR REVIEW

## 2022-03-18 SURGERY — IR WITH ANESTHESIA
Anesthesia: General

## 2022-03-18 MED ORDER — HYDRALAZINE HCL 20 MG/ML IJ SOLN
10.0000 mg | Freq: Four times a day (QID) | INTRAMUSCULAR | Status: DC | PRN
Start: 2022-03-18 — End: 2022-03-19

## 2022-03-18 MED ORDER — SUCRALFATE 1 G PO TABS
1.0000 g | ORAL_TABLET | Freq: Four times a day (QID) | ORAL | Status: DC
  Administered 2022-03-18 – 2022-03-19 (×2): 1 g via ORAL
  Filled 2022-03-18 (×2): qty 1

## 2022-03-18 MED ORDER — EPHEDRINE SULFATE-NACL 50-0.9 MG/10ML-% IV SOSY
PREFILLED_SYRINGE | INTRAVENOUS | Status: DC | PRN
  Administered 2022-03-18 (×2): 10 mg via INTRAVENOUS

## 2022-03-18 MED ORDER — ALBUMIN HUMAN 5 % IV SOLN
INTRAVENOUS | Status: AC
  Filled 2022-03-18: qty 250

## 2022-03-18 MED ORDER — ONDANSETRON HCL 4 MG/2ML IJ SOLN
4.0000 mg | Freq: Four times a day (QID) | INTRAMUSCULAR | Status: DC | PRN
Start: 2022-03-18 — End: 2022-03-19

## 2022-03-18 MED ORDER — LIDOCAINE 2% (20 MG/ML) 5 ML SYRINGE
INTRAMUSCULAR | Status: DC | PRN
  Administered 2022-03-18: 80 mg via INTRAVENOUS

## 2022-03-18 MED ORDER — ACETAMINOPHEN 325 MG PO TABS
650.0000 mg | ORAL_TABLET | Freq: Four times a day (QID) | ORAL | Status: DC | PRN
Start: 2022-03-18 — End: 2022-03-19

## 2022-03-18 MED ORDER — TAMSULOSIN HCL 0.4 MG PO CAPS
0.4000 mg | ORAL_CAPSULE | Freq: Every day | ORAL | Status: DC
  Administered 2022-03-19: 0.4 mg via ORAL
  Filled 2022-03-18: qty 1

## 2022-03-18 MED ORDER — ESCITALOPRAM OXALATE 10 MG PO TABS
5.0000 mg | ORAL_TABLET | Freq: Every day | ORAL | Status: DC
  Administered 2022-03-19: 5 mg via ORAL
  Filled 2022-03-18: qty 1

## 2022-03-18 MED ORDER — PHENYLEPHRINE 80 MCG/ML (10ML) SYRINGE FOR IV PUSH (FOR BLOOD PRESSURE SUPPORT)
PREFILLED_SYRINGE | INTRAVENOUS | Status: DC | PRN
  Administered 2022-03-18 (×5): 160 ug via INTRAVENOUS

## 2022-03-18 MED ORDER — SODIUM CHLORIDE 0.9 % IV SOLN: INTRAVENOUS | Status: DC | PRN

## 2022-03-18 MED ORDER — OXYCODONE HCL 5 MG PO TABS
5.0000 mg | ORAL_TABLET | ORAL | Status: DC | PRN
  Administered 2022-03-18: 5 mg via ORAL

## 2022-03-18 MED ORDER — ONDANSETRON HCL 4 MG/2ML IJ SOLN
INTRAMUSCULAR | Status: DC | PRN
  Administered 2022-03-18: 4 mg via INTRAVENOUS

## 2022-03-18 MED ORDER — ALBUTEROL SULFATE (2.5 MG/3ML) 0.083% IN NEBU
3.0000 mL | INHALATION_SOLUTION | Freq: Four times a day (QID) | RESPIRATORY_TRACT | Status: DC | PRN
Start: 2022-03-18 — End: 2022-03-19

## 2022-03-18 MED ORDER — BUSPIRONE HCL 15 MG PO TABS
7.5000 mg | ORAL_TABLET | Freq: Every evening | ORAL | Status: DC | PRN
Start: 2022-03-18 — End: 2022-03-19
  Administered 2022-03-18 (×2): 7.5 mg via ORAL
  Filled 2022-03-18: qty 1

## 2022-03-18 MED ORDER — ALBUMIN HUMAN 5 % IV SOLN
12.5000 g | Freq: Once | INTRAVENOUS | Status: AC
  Administered 2022-03-18: 12.5 g via INTRAVENOUS

## 2022-03-18 MED ORDER — SUGAMMADEX SODIUM 200 MG/2ML IV SOLN
INTRAVENOUS | Status: DC | PRN
  Administered 2022-03-18: 160 mg via INTRAVENOUS

## 2022-03-18 MED ORDER — RIFAXIMIN 550 MG PO TABS
550.0000 mg | ORAL_TABLET | Freq: Two times a day (BID) | ORAL | Status: DC
Start: 2022-03-18 — End: 2022-03-19
  Administered 2022-03-18 – 2022-03-19 (×2): 550 mg via ORAL
  Filled 2022-03-18 (×2): qty 1

## 2022-03-18 MED ORDER — PHENYLEPHRINE HCL-NACL 20-0.9 MG/250ML-% IV SOLN
INTRAVENOUS | Status: DC | PRN
  Administered 2022-03-18: 50 ug/min via INTRAVENOUS

## 2022-03-18 MED ORDER — OXYCODONE HCL 5 MG PO TABS
10.0000 mg | ORAL_TABLET | ORAL | Status: DC | PRN
  Filled 2022-03-18: qty 2

## 2022-03-18 MED ORDER — LACTULOSE 10 GM/15ML PO SOLN
20.0000 g | Freq: Three times a day (TID) | ORAL | Status: DC
  Administered 2022-03-18 – 2022-03-19 (×2): 20 g via ORAL
  Filled 2022-03-18 (×2): qty 30

## 2022-03-18 MED ORDER — DEXAMETHASONE SODIUM PHOSPHATE 10 MG/ML IJ SOLN
INTRAMUSCULAR | Status: DC | PRN
  Administered 2022-03-18: 4 mg via INTRAVENOUS

## 2022-03-18 MED ORDER — CHLORHEXIDINE GLUCONATE 0.12 % MT SOLN
15.0000 mL | Freq: Once | OROMUCOSAL | Status: AC
  Administered 2022-03-18: 15 mL via OROMUCOSAL
  Filled 2022-03-18: qty 15

## 2022-03-18 MED ORDER — SODIUM CHLORIDE 0.9 % IV SOLN
2.0000 g | Freq: Once | INTRAVENOUS | Status: AC
  Administered 2022-03-18: 2 g via INTRAVENOUS
  Filled 2022-03-18 (×2): qty 20

## 2022-03-18 MED ORDER — FINASTERIDE 5 MG PO TABS
5.0000 mg | ORAL_TABLET | Freq: Every day | ORAL | Status: DC
Start: 2022-03-19 — End: 2022-03-19
  Administered 2022-03-19: 5 mg via ORAL
  Filled 2022-03-18: qty 1

## 2022-03-18 MED ORDER — PANTOPRAZOLE SODIUM 40 MG PO TBEC
40.0000 mg | DELAYED_RELEASE_TABLET | Freq: Two times a day (BID) | ORAL | Status: DC
Start: 2022-03-18 — End: 2022-03-19
  Administered 2022-03-18 – 2022-03-19 (×2): 40 mg via ORAL
  Filled 2022-03-18 (×2): qty 1

## 2022-03-18 MED ORDER — FENTANYL CITRATE (PF) 100 MCG/2ML IJ SOLN
INTRAMUSCULAR | Status: DC | PRN
  Administered 2022-03-18 (×2): 50 ug via INTRAVENOUS

## 2022-03-18 MED ORDER — IOHEXOL 300 MG/ML  SOLN
100.0000 mL | Freq: Once | INTRAMUSCULAR | Status: AC | PRN
  Administered 2022-03-18: 60 mL via INTRAVENOUS

## 2022-03-18 MED ORDER — FENTANYL CITRATE (PF) 100 MCG/2ML IJ SOLN: 25.0000 ug | INTRAMUSCULAR | Status: DC | PRN

## 2022-03-18 MED ORDER — ROSUVASTATIN CALCIUM 5 MG PO TABS
5.0000 mg | ORAL_TABLET | Freq: Every day | ORAL | Status: DC
Start: 2022-03-19 — End: 2022-03-19
  Administered 2022-03-19: 5 mg via ORAL
  Filled 2022-03-18: qty 1

## 2022-03-18 MED ORDER — SUCCINYLCHOLINE CHLORIDE 200 MG/10ML IV SOSY
PREFILLED_SYRINGE | INTRAVENOUS | Status: DC | PRN
  Administered 2022-03-18: 120 mg via INTRAVENOUS

## 2022-03-18 MED ORDER — CARVEDILOL 6.25 MG PO TABS
6.2500 mg | ORAL_TABLET | Freq: Two times a day (BID) | ORAL | Status: DC
  Administered 2022-03-19: 6.25 mg via ORAL
  Filled 2022-03-18: qty 1

## 2022-03-18 MED ORDER — OXYCODONE HCL 5 MG PO TABS: 5.0000 mg | ORAL_TABLET | Freq: Once | ORAL | Status: DC | PRN

## 2022-03-18 MED ORDER — ONDANSETRON HCL 4 MG/2ML IJ SOLN: 4.0000 mg | Freq: Once | INTRAMUSCULAR | Status: DC | PRN

## 2022-03-18 MED ORDER — GABAPENTIN 300 MG PO CAPS
300.0000 mg | ORAL_CAPSULE | Freq: Two times a day (BID) | ORAL | Status: DC
Start: 2022-03-18 — End: 2022-03-19
  Administered 2022-03-18 – 2022-03-19 (×2): 300 mg via ORAL
  Filled 2022-03-18 (×2): qty 1

## 2022-03-18 MED ORDER — ROCURONIUM BROMIDE 10 MG/ML (PF) SYRINGE
PREFILLED_SYRINGE | INTRAVENOUS | Status: DC | PRN
  Administered 2022-03-18: 20 mg via INTRAVENOUS
  Administered 2022-03-18: 30 mg via INTRAVENOUS

## 2022-03-18 MED ORDER — LACTATED RINGERS IV SOLN: INTRAVENOUS | Status: DC

## 2022-03-18 MED ORDER — SODIUM CHLORIDE 0.9 % IV SOLN: INTRAVENOUS | Status: DC

## 2022-03-18 MED ORDER — PROPOFOL 10 MG/ML IV BOLUS
INTRAVENOUS | Status: DC | PRN
  Administered 2022-03-18: 100 mg via INTRAVENOUS
  Administered 2022-03-18: 20 mg via INTRAVENOUS

## 2022-03-18 MED ORDER — ORAL CARE MOUTH RINSE: 15.0000 mL | Freq: Once | OROMUCOSAL | Status: AC

## 2022-03-18 MED ORDER — OXYCODONE HCL 5 MG/5ML PO SOLN: 5.0000 mg | Freq: Once | ORAL | Status: DC | PRN

## 2022-03-18 NOTE — Procedures (Signed)
Interventional Radiology Procedure Note  Procedure:  1) Ultrasound guided paracentesis 2) TIPS creation  Findings: Please refer to procedural dictation for full description. 6+2 cm Viatorr, middle hepatic vein to right portal vein.  Mean pressure measurements (mmHg): Pre: RA = 14 PV = 27 Gradient = 13  Post: RA = 15 PV = 17 Gradient = 2  Complications: None immediate  Estimated Blood Loss: 5 mL  Recommendations: -admit to IR for overnight observation -Foley out -clear liquid diet, advance as tolerated -resume home medications -start lactulose TID (3-4 BMs/day) -morning CBC, CMP, INR -close IR follow up after discharge via [facility]   Ruthann Cancer, MD Pager: [phone] Clinic: [phone]

## 2022-03-18 NOTE — Progress Notes (Signed)
Pre RA 14 Pre PV 27  Pre Gradient 13  Post-Tips (Post-Angioplasty) Post RA 15 Post PV 17 Gradient 2

## 2022-03-18 NOTE — Transfer of Care (Signed)
Immediate Anesthesia Transfer of Care Note  Patient: TERRIE GRAJALES  Procedure(s) Performed: TIPS  Patient Location: PACU  Anesthesia Type:General  Level of Consciousness: awake and alert   Airway & Oxygen Therapy: Patient Spontanous Breathing and Patient connected to face mask oxygen  Post-op Assessment: Report given to RN and Post -op Vital signs reviewed and stable  Post vital signs: Reviewed and stable  Last Vitals:  Vitals Value Taken Time  BP 101/66 03/18/22 1556  Temp    Pulse 66 03/18/22 1602  Resp 20 03/18/22 1602  SpO2 95 % 03/18/22 1602  Vitals shown include unvalidated device data.  Last Pain:  Vitals:   03/18/22 0926  PainSc: 7       Patients Stated Pain Goal: 2 (50/75/73 2256)  Complications: No notable events documented.

## 2022-03-18 NOTE — Progress Notes (Signed)
Jannifer Franklin, PA made aware of patient's CBC result from today.

## 2022-03-18 NOTE — Anesthesia Postprocedure Evaluation (Signed)
Anesthesia Post Note  Patient: [name]  Procedure(s) Performed: TIPS     Patient location during evaluation: PACU Anesthesia Type: General Level of consciousness: awake and alert and oriented Pain management: pain level controlled Vital Signs Assessment: post-procedure vital signs reviewed and stable Respiratory status: spontaneous breathing, nonlabored ventilation and respiratory function stable Cardiovascular status: blood pressure returned to baseline and stable Postop Assessment: no apparent nausea or vomiting Anesthetic complications: no   No notable events documented.  Last Vitals:  Vitals:   03/18/22 1700 03/18/22 1730  BP: (!) 91/48 (!) 99/44  Pulse: (!) 53 (!) 55  Resp: 19 17  Temp:    SpO2: 98% 99%    Last Pain:  Vitals:   03/18/22 1627  PainSc: 0-No pain                 Eurydice Calixto A.

## 2022-03-18 NOTE — Anesthesia Procedure Notes (Signed)
Procedure Name: Intubation Date/Time: 03/18/2022 1:29 PM  Performed by: Anastasio Auerbach, CRNAPre-anesthesia Checklist: Patient identified, Emergency Drugs available, Suction available and Patient being monitored Patient Re-evaluated:Patient Re-evaluated prior to induction Oxygen Delivery Method: Circle system utilized Preoxygenation: Pre-oxygenation with 100% oxygen Induction Type: IV induction Ventilation: Mask ventilation without difficulty Laryngoscope Size: Mac and 3 Grade View: Grade I Tube type: Oral Tube size: 8.0 mm Number of attempts: 1 Airway Equipment and Method: Stylet Placement Confirmation: ETT inserted through vocal cords under direct vision, positive ETCO2 and breath sounds checked- equal and bilateral Secured at: 22 cm Tube secured with: Tape Dental Injury: Teeth and Oropharynx as per pre-operative assessment

## 2022-03-18 NOTE — H&P (Signed)
Chief Complaint: Patient was seen in consultation today for Transjugular intrahepatic portal system shunt and paracentesis at the request of Dr Pat Kocher  Supervising Physician: Ruthann Cancer  Patient Status: [facility] - Out-pt  History of Present Illness: [name] is a 81 y.o. 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  More recently he developed melena and anemia and underwent upper endoscopy with Dr. Silverio Decamp on 02/03/2022.  On Endoscopy, there were Grade I esophageal varices, portal hypertensive gastropahy, non bleeding gastric ulcer, and grade III duodenal ulcers.  He required 3 paracentesis in April, 2023.     He is an appropriate candidate for TIPS/BRTO which would treat both his upper GI bleeding and recurrent ascites.       Risks and benefits of TIPS, BRTO and/or additional variceal embolization were discussed with the patient and/or the patient's family including, but not limited to, infection, bleeding, damage to adjacent structures, worsening hepatic and/or cardiac function, worsening and/or the development of altered mental status/encephalopathy, non-target embolization and death.   Scheduled today for TIPS procedure with paracentesis Dr Serafina Royals has seen and examine pt today Pt denies any N/V/D Some diffuse abdominal "fullness" Last LVP: 03/15/22  4.5 Liters  MELD today:  pending   Past Medical History:  Diagnosis Date   Aortic atherosclerosis ([place])    Benign prostatic hypertrophy    Blood transfusion without reported diagnosis    CHF (congestive heart failure) ([place])    a. EF 20-25% by echo in 11/2019   COPD (chronic obstructive pulmonary disease) ([place]) 12/15/2009   FEV1 2.30 (70%) ratio 63 no better with B2 and DLCO 18/6 (73%) corrects to 106%   Degenerative joint disease    Depression    Diverticulosis    Duodenal  varices    Esophageal varices (HCC)    Essential hypertension    ACE inhibitor cough   Fatty liver    Gastric ulcer    Hepatic cirrhosis (HCC)    Hepatocellular carcinoma (HCC)    Hyperlipidemia    IDA (iron deficiency anemia)    Internal hemorrhoids    Liver cancer ([place])    Low back pain    Otitis externa 07/30/2012   Pneumonia    Portal hypertensive gastropathy (HCC)    SBP (spontaneous bacterial peritonitis) ([place])    Sepsis ([place])    Splenomegaly    Thrombocytopenia ([place])    Type 2 diabetes mellitus ([place])    no meds   Ulcer of foot ([place])    Right foot    Past Surgical History:  Procedure Laterality Date   BIOPSY  07/14/2021   Procedure: BIOPSY;  Surgeon: Daryel November, MD;  Location: [place];  Service: Gastroenterology;;  EGD and COLON   BIOPSY  02/03/2022   Procedure: BIOPSY;  Surgeon: Mauri Pole, MD;  Location: [facility];  Service: Gastroenterology;;   COLONOSCOPY  2020   JMP-MAC-plenvu (good)-polyps-recall 1 yr   COLONOSCOPY WITH PROPOFOL N/A 07/14/2021   Procedure: COLONOSCOPY WITH PROPOFOL;  Surgeon: Daryel November, MD;  Location: [facility] ENDOSCOPY;  Service: Gastroenterology;  Laterality: N/A;   ESOPHAGOGASTRODUODENOSCOPY (EGD) WITH PROPOFOL N/A 07/14/2021   Procedure: ESOPHAGOGASTRODUODENOSCOPY (EGD) WITH PROPOFOL;  Surgeon: Daryel November, MD;  Location: [place];  Service: Gastroenterology;  Laterality: N/A;   ESOPHAGOGASTRODUODENOSCOPY (EGD) WITH PROPOFOL N/A 02/03/2022   Procedure: ESOPHAGOGASTRODUODENOSCOPY (EGD) WITH PROPOFOL;  Surgeon: Harl Bowie  V, MD;  Location: [facility];  Service: Gastroenterology;  Laterality: N/A;   EYE SURGERY     HEMOSTASIS CLIP PLACEMENT  02/03/2022   Procedure: HEMOSTASIS CLIP PLACEMENT;  Surgeon: Mauri Pole, MD;  Location: [facility];  Service: Gastroenterology;;   IR EMBO TUMOR ORGAN ISCHEMIA INFARCT INC GUIDE ROADMAPPING  06/01/2020   IR PARACENTESIS  08/30/2021   IR  PARACENTESIS  09/22/2021   IR PARACENTESIS  10/12/2021   IR PARACENTESIS  11/02/2021   IR PARACENTESIS  11/15/2021   IR PARACENTESIS  11/30/2021   IR PARACENTESIS  12/14/2021   IR PARACENTESIS  12/27/2021   IR PARACENTESIS  01/04/2022   IR PARACENTESIS  01/12/2022   IR PARACENTESIS  01/19/2022   IR PARACENTESIS  02/02/2022   IR PARACENTESIS  02/16/2022   IR PARACENTESIS  02/28/2022   IR PARACENTESIS  03/15/2022   IR RADIOLOGIST EVAL & MGMT  04/28/2020   IR RADIOLOGIST EVAL & MGMT  07/15/2020   IR RADIOLOGIST EVAL & MGMT  05/04/2021    Allergies: Patient has no known allergies.  Medications: Prior to Admission medications   Medication Sig Start Date End Date Taking? Authorizing Provider  albuterol (VENTOLIN HFA) 108 (90 Base) MCG/ACT inhaler Inhale 2 puffs into the lungs every 6 (six) hours as needed for wheezing or shortness of breath. 11/01/21   [name], MD  busPIRone (BUSPAR) 7.5 MG tablet Take 1 tablet (7.5 mg total) by mouth 2 (two) times daily as needed. for anxiety Patient taking differently: Take 7.5 mg by mouth See admin instructions. Take 7.5 mg at bedtime, may take a second 7.5 mg dose as needed for anxiety 05/14/21   [name], MD  carvedilol (COREG) 6.25 MG tablet Take 1 tablet (6.25 mg total) by mouth 2 (two) times daily with a meal. 02/01/22   [name], MD  ciprofloxacin (CIPRO) 250 MG tablet Take 1 tablet (250 mg total) by mouth daily. 02/09/22   Pyrtle, Lajuan Lines, MD  escitalopram (LEXAPRO) 5 MG tablet Take 1 tablet (5 mg total) by mouth daily. 05/14/21   [name], MD  finasteride (PROSCAR) 5 MG tablet TAKE 1 TABLET EVERY DAY 01/10/22   [name], MD  gabapentin (NEURONTIN) 300 MG capsule TAKE 1 CAPSULE TWICE DAILY Patient taking differently: Take 300 mg by mouth 2 (two) times daily. 03/15/21   [name], MD  oxyCODONE-acetaminophen (PERCOCET) 10-325 MG tablet Take 1 tablet by mouth every 8 (eight) hours as needed for pain (chronic  pain in cancer patient with cirrhosis). 03/10/22 2022-05-07  [name], MD  pantoprazole (PROTONIX) 40 MG tablet Take 1 tablet (40 mg total) by mouth 2 (two) times daily. 02/01/22   [name], MD  rifaximin (XIFAXAN) 550 MG TABS tablet Take 1 tablet (550 mg total) by mouth 2 (two) times daily. 02/14/22   Pyrtle, Lajuan Lines, MD  rosuvastatin (CRESTOR) 5 MG tablet TAKE 1 TABLET EVERY DAY 11/16/21   [name], MD  sucralfate (CARAFATE) 1 g tablet Take 1 tablet (1 g total) by mouth 4 (four) times daily. 02/03/22 02/03/23  Mauri Pole, MD  tamsulosin (FLOMAX) 0.4 MG CAPS capsule TAKE 1 CAPSULE EVERY DAY 03/14/22   [name], MD     Family History  Problem Relation Age of Onset   Melanoma Mother    Stroke Father    CAD Brother        Premature disease   Colon polyps Neg Hx  Colon cancer Neg Hx    Esophageal cancer Neg Hx    Stomach cancer Neg Hx    Rectal cancer Neg Hx     Social History   Socioeconomic History   Marital status: Unknown    Spouse name: Not on file   Number of children: Not on file   Years of education: Not on file   Highest education level: Not on file  Occupational History   Occupation: retired    Comment: maintenence work  Tobacco Use   Smoking status: Former    Packs/day: 4.00    Years: 20.00    Total pack years: 80.00    Types: Cigarettes    Quit date: 09/18/1984    Years since quitting: 37.5   Smokeless tobacco: Never   Tobacco comments:    quit in [place] Use   Vaping Use: Never used  Substance and Sexual Activity   Alcohol use: No    Comment: no drinking since 40 years plus   Drug use: No   Sexual activity: Yes  Other Topics Concern   Not on file  Social History Narrative   ** Merged History Encounter **       Married 53 years in 2015. 4 kids (2 sons) and 3 grandkids.    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             Social Determinants of [place] Strain: [place]  (09/10/2021)   Overall Financial Resource Strain (CARDIA)    Difficulty of Paying Living Expenses: Not hard at all  Food Insecurity: No Food Insecurity (09/10/2021)   Hunger Vital Sign    Worried About Running Out of Food in the Last Year: Never true    [place] in the Last Year: Never true  Transportation Needs: No Transportation Needs (09/10/2021)   PRAPARE - Hydrologist (Medical): No    Lack of Transportation (Non-Medical): No  Physical Activity: Inactive (09/10/2021)   Exercise Vital Sign    Days of Exercise per Week: 0 days    Minutes of Exercise per Session: 0 min  Stress: No Stress Concern Present (09/10/2021)   [place]    Feeling of Stress : Only a little  Social Connections: Moderately Integrated (09/10/2021)   Social Connection and Isolation Panel [NHANES]    Frequency of Communication with Friends and Family: More than three times a week    Frequency of Social Gatherings with Friends and Family: More than three times a week    Attends Religious Services: More than 4 times per year    Active Member of [employer] or Organizations: No    Attends Archivist Meetings: Never    Marital Status: Married    Review of Systems: A 12 point ROS discussed and pertinent positives are indicated in the HPI above.  All other systems are negative.  Review of Systems  Constitutional:  Negative for activity change, fatigue and fever.  Respiratory:  Negative for cough.        Orthopnea   Cardiovascular:  Positive for leg swelling. Negative for chest pain.  Gastrointestinal:  Positive for abdominal distention. Negative for abdominal pain, diarrhea, nausea and vomiting.  Musculoskeletal:  Negative for back pain.  Neurological:  Negative for weakness.  Psychiatric/Behavioral:  Negative for behavioral problems and  confusion.     Vital Signs: BP 102/67   Pulse 87   Temp 97.6 F (36.4 C)   Resp 20   Ht _0  (1.803 m)   Wt 176 lb (79.8 kg)   SpO2 97%   BMI 24.55 kg/m   Physical Exam Vitals reviewed.  HENT:     Mouth/Throat:     Mouth: Mucous membranes are moist.  Cardiovascular:     Rate and Rhythm: Normal rate and regular rhythm.  Pulmonary:     Effort: Pulmonary effort is normal.     Comments: Decreased breath sounds throughout Mild  Abdominal:     General: Bowel sounds are normal. There is distension.     Palpations: Abdomen is soft.     Tenderness: There is no abdominal tenderness.  Musculoskeletal:        General: Normal range of motion.     Right lower leg: Edema present.     Left lower leg: Edema present.     Comments: 1+ pitting edema   Skin:    General: Skin is warm.  Neurological:     Mental Status: He is alert and oriented to person, place, and time.  Psychiatric:        Behavior: Behavior normal.     Imaging: IR Paracentesis  Result Date: 03/15/2022 INDICATION: Cirrhosis, recurrent ascites EXAM: ULTRASOUND GUIDED right lower quadrant PARACENTESIS MEDICATIONS: None. COMPLICATIONS: None immediate. PROCEDURE: Informed written consent was obtained from the patient after a discussion of the risks, benefits and alternatives to treatment. A timeout was performed prior to the initiation of the procedure. Initial ultrasound scanning demonstrates a large amount of ascites within the right lower abdominal quadrant. The right lower abdomen was prepped and draped in the usual sterile fashion. 1% lidocaine was used for local anesthesia. Following this, a 19 gauge, 7-cm, Yueh catheter was introduced. An ultrasound image was saved for documentation purposes. The paracentesis was performed. The catheter was removed and a dressing was applied. The patient tolerated the procedure well without immediate post procedural complication. FINDINGS: A total of approximately 4.5L of clear ascitic  fluid was removed. Samples were sent to the laboratory as requested by the clinical team. IMPRESSION: Successful ultrasound-guided paracentesis yielding 4.5 liters of peritoneal fluid. Note: TIPS planned for Friday 03/18/2022 Performed and Read by Pasty Spillers, PA-C Electronically Signed   By: Ruthann Cancer M.D.   On: 03/15/2022 14:22   IR Paracentesis  Result Date: 02/28/2022 INDICATION: History of cirrhosis and hepatocellular carcinoma with recurrent ascites. Request for diagnostic and therapeutic paracentesis with a max of 8 L. EXAM: ULTRASOUND GUIDED DIAGNOSTIC AND THERAPEUTIC LEFT LOWER QUADRANT PARACENTESIS MEDICATIONS: 10 mL 1 % lidocaine COMPLICATIONS: None immediate. PROCEDURE: Informed written consent was obtained from the patient after a discussion of the risks, benefits and alternatives to treatment. A timeout was performed prior to the initiation of the procedure. Initial ultrasound scanning demonstrates a large amount of ascites within the left lower abdominal quadrant. The left lower abdomen was prepped and draped in the usual sterile fashion. 1% lidocaine was used for local anesthesia. Following this, a 19 gauge, 7-cm, Yueh catheter was introduced. An ultrasound image was saved for documentation purposes. The paracentesis was performed. The catheter was removed and a dressing was applied. The patient tolerated the procedure well without immediate post procedural complication. Patient received post-procedure intravenous albumin; see nursing notes for details. FINDINGS: A total of approximately 7.1 L of clear, yellow fluid was  removed. Samples were sent to the laboratory as requested by the clinical team. IMPRESSION: Successful ultrasound-guided paracentesis yielding 7.1 liters of peritoneal fluid. Read by: Narda Rutherford, AGNP-BC PLAN: If the patient eventually requires >/=2 paracenteses in a 30 day period, candidacy for formal evaluation by the [facility] Interventional Radiology Portal  Hypertension Clinic will be assessed. Electronically Signed   By: Miachel Roux M.D.   On: 02/28/2022 11:16   IR Paracentesis  Result Date: 02/16/2022 INDICATION: History of cirrhosis and hepatocellular carcinoma with recurrent ascites. Request for diagnostic and therapeutic paracentesis with a max of 8 L. EXAM: ULTRASOUND GUIDED DIAGNOSTIC AND THERAPEUTIC RIGHT LOWER QUADRANT PARACENTESIS MEDICATIONS: 10 mL 1 % lidocaine COMPLICATIONS: None immediate. PROCEDURE: Informed written consent was obtained from the patient after a discussion of the risks, benefits and alternatives to treatment. A timeout was performed prior to the initiation of the procedure. Initial ultrasound scanning demonstrates a large amount of ascites within the right lower abdominal quadrant. The right lower abdomen was prepped and draped in the usual sterile fashion. 1% lidocaine was used for local anesthesia. Following this, a 19 gauge, 7-cm, Yueh catheter was introduced. An ultrasound image was saved for documentation purposes. The paracentesis was performed. The catheter was removed and a dressing was applied. The patient tolerated the procedure well without immediate post procedural complication. Patient received post-procedure intravenous albumin; see nursing notes for details. FINDINGS: A total of approximately 8 L of clear, yellow fluid was removed. Samples were sent to the laboratory as requested by the clinical team. IMPRESSION: Successful ultrasound-guided paracentesis yielding 8 liters of peritoneal fluid. Read by: Narda Rutherford, AGNP-BC PLAN: If the patient eventually requires >/=2 paracenteses in a 30 day period, candidacy for formal evaluation by the [facility] Interventional Radiology Portal Hypertension Clinic will be assessed. Electronically Signed   By: Aletta Edouard M.D.   On: 02/16/2022 14:52    Labs:  CBC: Recent Labs    02/01/22 1350 02/11/22 1124 02/24/22 1041 03/10/22 1200  WBC 5.5 4.1 3.8* 6.6  HGB 8.8*  7.9* 9.8* 9.6*  HCT 27.9* 26.1* 30.1* 29.9*  PLT 114.0* 102* 91* 115*    COAGS: Recent Labs    09/16/21 1556 10/08/21 1037 11/03/21 1804 12/23/21 1156  INR 1.1* 1.1 1.3* 1.0    BMP: Recent Labs    01/27/22 1024 02/01/22 1350 02/09/22 1238 02/11/22 1124 02/24/22 1041 03/10/22 1200  NA 136   < > 134* 135 136 138  K 5.2*   < > 5.3* 4.7 4.7 4.1  CL 108   < > 105 107 107 108  CO2 24   < > _0 GLUCOSE 152*   < > 130* 169* 121* 153*  BUN 42*   < > 32* 34* 33* 40*  CALCIUM 8.8*   < > 8.4 8.3* 8.6* 8.6*  CREATININE 1.69*   < > 2.01* 2.03* 1.78* 1.38*  GFRNONAA 40*  --   --  32* 38* 51*   < > = values in this interval not displayed.    LIVER FUNCTION TESTS: Recent Labs    02/01/22 1350 02/11/22 1124 02/24/22 1041 03/10/22 1200  BILITOT 0.6 0.5 0.8 0.9  AST _1 ALT _2 ALKPHOS 102 100 114 107  PROT 5.4* 5.3* 5.7* 5.3*  ALBUMIN 3.1* 2.8* 3.0* 2.7*    TUMOR MARKERS: No results for input(s): "AFPTM", "CEA", "CA199", "CHROMGRNA" in the last 8760 hours.  Assessment and Plan:  Scheduled for TIPS  and paracentesis today in IR Risks and benefits of TIPS, BRTO and/or additional variceal embolization were discussed with the patient and/or the patient's family including, but not limited to, infection, bleeding, damage to adjacent structures, worsening hepatic and/or cardiac function, worsening and/or the development of altered mental status/encephalopathy, non-target embolization and death.   This interventional procedure involves the use of X-rays and because of the nature of the planned procedure, it is possible that we will have prolonged use of X-ray fluoroscopy.  Potential radiation risks to you include (but are not limited to) the following: - A slightly elevated risk for cancer  several years later in life. This risk is typically less than 0.5% percent. This risk is low in comparison to the normal incidence of human cancer, which is 33% for  women and 50% for men according to the [place]. - Radiation induced injury can include skin redness, resembling a rash, tissue breakdown / ulcers and hair loss (which can be temporary or permanent).   The likelihood of either of these occurring depends on the difficulty of the procedure and whether you are sensitive to radiation due to previous procedures, disease, or genetic conditions.   IF your procedure requires a prolonged use of radiation, you will be notified and given written instructions for further action.  It is your responsibility to monitor the irradiated area for the 2 weeks following the procedure and to notify your physician if you are concerned that you have suffered a radiation induced injury.    All of the patient's questions were answered, patient is agreeable to proceed.  Consent signed and in chart.   Thank you for this interesting consult.  I greatly enjoyed meeting [name] and look forward to participating in their care.  A copy of this report was sent to the requesting provider on this date.  Electronically Signed: Lavonia Drafts, PA-C 03/18/2022, 9:12 AM   I spent a total of    25 Minutes in face to face in clinical consultation, greater than 50% of which was counseling/coordinating care for TIPS/para

## 2022-03-18 NOTE — Anesthesia Procedure Notes (Signed)
Arterial Line Insertion Start/End6/06/2022 12:15 PM, 03/18/2022 12:23 PM Performed by: Effie Berkshire, MD, Vonna Drafts, CRNA, CRNA  Patient location: Pre-op. Preanesthetic checklist: patient identified, IV checked, site marked, risks and benefits discussed, surgical consent, monitors and equipment checked, pre-op evaluation, timeout performed and anesthesia consent Lidocaine 1% used for infiltration Left, radial was placed Catheter size: 20 G Hand hygiene performed  and maximum sterile barriers used   Attempts: 1 Procedure performed without using ultrasound guided technique. Following insertion, Biopatch and dressing applied. Post procedure assessment: normal  Patient tolerated the procedure well with no immediate complications.

## 2022-03-19 LAB — CBC
HCT: 28.7 % — ABNORMAL LOW (ref 39.0–52.0)
Hemoglobin: 9.3 g/dL — ABNORMAL LOW (ref 13.0–17.0)
MCH: 30.7 pg (ref 26.0–34.0)
MCHC: 32.4 g/dL (ref 30.0–36.0)
MCV: 94.7 fL (ref 80.0–100.0)
Platelets: 121 10*3/uL — ABNORMAL LOW (ref 150–400)
RBC: 3.03 MIL/uL — ABNORMAL LOW (ref 4.22–5.81)
RDW: 19.1 % — ABNORMAL HIGH (ref 11.5–15.5)
WBC: 5.9 10*3/uL (ref 4.0–10.5)
nRBC: 0 % (ref 0.0–0.2)

## 2022-03-19 LAB — COMPREHENSIVE METABOLIC PANEL
ALT: 27 U/L (ref 0–44)
AST: 84 U/L — ABNORMAL HIGH (ref 15–41)
Albumin: 2.5 g/dL — ABNORMAL LOW (ref 3.5–5.0)
Alkaline Phosphatase: 115 U/L (ref 38–126)
Anion gap: 8 (ref 5–15)
BUN: 39 mg/dL — ABNORMAL HIGH (ref 8–23)
CO2: 21 mmol/L — ABNORMAL LOW (ref 22–32)
Calcium: 8.6 mg/dL — ABNORMAL LOW (ref 8.9–10.3)
Chloride: 108 mmol/L (ref 98–111)
Creatinine, Ser: 1.5 mg/dL — ABNORMAL HIGH (ref 0.61–1.24)
GFR, Estimated: 46 mL/min — ABNORMAL LOW (ref >60)
Glucose, Bld: 153 mg/dL — ABNORMAL HIGH (ref 70–99)
Potassium: 5.5 mmol/L — ABNORMAL HIGH (ref 3.5–5.1)
Sodium: 137 mmol/L (ref 135–145)
Total Bilirubin: 2 mg/dL — ABNORMAL HIGH (ref 0.3–1.2)
Total Protein: 4.5 g/dL — ABNORMAL LOW (ref 6.5–8.1)

## 2022-03-19 LAB — PROTIME-INR
INR: 1.6 — ABNORMAL HIGH (ref 0.8–1.2)
Prothrombin Time: 18.5 seconds — ABNORMAL HIGH (ref 11.4–15.2)

## 2022-03-19 MED ORDER — LACTULOSE 10 GM/15ML PO SOLN: 20.0000 g | Freq: Three times a day (TID) | ORAL | 0 refills | Status: DC

## 2022-03-19 NOTE — Discharge Summary (Signed)
Patient ID: [name] MRN: [id number] DOB/AGE: 1940-11-11 81 y.o.  Admit date: 03/18/2022 Discharge date: 03/19/2022  Supervising Physician: Arne Cleveland  Patient Status: [facility] - In-pt  Admission Diagnoses: hepatic cirrhosis   Discharge Diagnoses:  Principal Problem:   S/P TIPS (transjugular intrahepatic portosystemic shunt)   Discharged Condition: good  Hospital Course:   Patient presented to [facility] IR for an image-guided TIPS creation with Dr. Serafina Royals on 03/18/22. Procedure occurred without major complications and patient was transferred to floor in stable condition (VSS, R groin and neck puncture site stable) for overnight observation. No major events occurred overnight.   Patient awake and alert, has no complaints this morning. Reports he has not had BM yet.  Right groin and right neck  puncture site stable.  Plan to discharge home today and follow-up with Dr. Serafina Royals for 3-4 weeks after discharge.   Discussed the importance of taking lactulose as prescribed and have bowel movement at least three times a day.  Patient verbalized understanding.   Consults: None  Significant Diagnostic Studies:  CBC stable AST and T bili elevated, expected INR elevated, expected   Treatments: TIPS creation   Discharge Exam: Blood pressure (!) 102/55, pulse 91, temperature 98.2 F (36.8 C), temperature source Oral, resp. rate 18, height '5\' 11"'$  (1.803 m), weight 174 lb 2.6 oz (79 kg), SpO2 97 %.  Physical Exam Vitals and nursing note reviewed.  Constitutional:      General: Patient is not in acute distress.    Appearance: Normal appearance. Patient is not ill-appearing.  HENT:     Head: Normocephalic and atraumatic.      Cardiovascular:     Rate and Rhythm: Normal rate and regular rhythm.     Pulses: Normal pulses.     Heart sounds: + Murmur at TV DP 2+ bilaterally   Pulmonary:     Effort: Pulmonary effort is normal.     Breath sounds: Normal breath sounds.  Abdominal:      General: Abdomen is flat. Bowel sounds are normal.     Palpations: Abdomen is soft.  Musculoskeletal:     Cervical back: Neck supple.  Positive dressing on right neck puncture site. Site is unremarkable with no erythema, edema, tenderness, bleeding or drainage. Minimal amount of old, dry blood noted on the dressing. Dressing otherwise clean, dry, and intact.   Skin:    General: Skin is warm and dry.     Coloration: Skin is not jaundiced or pale.  Positive dressing on right groin puncture site. Site is unremarkable with no erythema, edema, tenderness, bleeding or drainage. Minimal amount of old, dry blood noted on the dressing. Dressing otherwise clean, dry, and intact.    Neurological:     Mental Status: Patient is alert and oriented to person, place, and time.  Psychiatric:        Mood and Affect: Mood normal.        Behavior: Behavior normal.        Judgment: Judgment normal.   Disposition: Discharge disposition: 01-Home or Self Care       Discharge Instructions     Call MD for:   Complete by: As directed    Call MD for:  difficulty breathing, headache or visual disturbances   Complete by: As directed    Call MD for:  extreme fatigue   Complete by: As directed    Call MD for:  hives   Complete by: As directed    Call MD for:  persistant dizziness or light-headedness   Complete by: As directed    Call MD for:  persistant nausea and vomiting   Complete by: As directed    Call MD for:  redness, tenderness, or signs of infection (pain, swelling, redness, odor or green/yellow discharge around incision site)   Complete by: As directed    Call MD for:  severe uncontrolled pain   Complete by: As directed    Call MD for:  temperature >100.4   Complete by: As directed    Diet - low sodium heart healthy   Complete by: As directed    Discharge wound care:   Complete by: As directed    Keep a Band-aid on right groin and right neck puncture sites until fully heals. Do not  submerge (sitting is a bath tub, swimming) for 7 days. OK to take shower.   Increase activity slowly   Complete by: As directed       Allergies as of 03/19/2022   No Known Allergies      Medication List     TAKE these medications    albuterol 108 (90 Base) MCG/ACT inhaler Commonly known as: VENTOLIN HFA Inhale 2 puffs into the lungs every 6 (six) hours as needed for wheezing or shortness of breath.   busPIRone 7.5 MG tablet Commonly known as: BUSPAR Take 1 tablet (7.5 mg total) by mouth 2 (two) times daily as needed. for anxiety What changed:  when to take this additional instructions   calcium carbonate 750 MG chewable tablet Commonly known as: TUMS EX Chew 2 tablets by mouth daily as needed for heartburn.   carvedilol 6.25 MG tablet Commonly known as: COREG Take 1 tablet (6.25 mg total) by mouth 2 (two) times daily with a meal.   ciprofloxacin 250 MG tablet Commonly known as: Cipro Take 1 tablet (250 mg total) by mouth daily.   Entresto 24-26 MG Generic drug: sacubitril-valsartan Take 1 tablet by mouth 2 (two) times daily.   escitalopram 5 MG tablet Commonly known as: LEXAPRO Take 1 tablet (5 mg total) by mouth daily.   finasteride 5 MG tablet Commonly known as: PROSCAR TAKE 1 TABLET EVERY DAY   furosemide 20 MG tablet Commonly known as: LASIX Take 20 mg by mouth daily.   gabapentin 300 MG capsule Commonly known as: NEURONTIN TAKE 1 CAPSULE TWICE DAILY   lactulose 10 GM/15ML solution Commonly known as: CHRONULAC Take 30 mLs (20 g total) by mouth 3 (three) times daily.   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   pantoprazole 40 MG tablet Commonly known as: PROTONIX Take 1 tablet (40 mg total) by mouth 2 (two) times daily.   prednisoLONE acetate 1 % ophthalmic suspension Commonly known as: PRED FORTE Place  1 drop into the right eye 2 (two) times daily.   rifaximin 550 MG Tabs tablet Commonly known as: XIFAXAN Take 1 tablet (550 mg total) by mouth 2 (two) times daily.   rosuvastatin 5 MG tablet Commonly known as: CRESTOR TAKE 1 TABLET EVERY DAY What changed:  how much to take how to take this when to take this additional instructions   sucralfate 1 g tablet Commonly known as: Carafate Take 1 tablet (1 g total) by mouth 4 (four) times daily.   tamsulosin 0.4 MG Caps capsule Commonly known as: FLOMAX TAKE 1 CAPSULE EVERY DAY  Discharge Care Instructions  (From admission, onward)           Start     Ordered   03/19/22 0000  Discharge wound care:       Comments: Keep a Band-aid on right groin and right neck puncture sites until fully heals. Do not submerge (sitting is a bath tub, swimming) for 7 days. OK to take shower.   03/19/22 1117            Follow-up Information     Suttle, Rosanne Ashing, MD Follow up.   Specialties: Interventional Radiology, Diagnostic Radiology, Radiology Why: Follow up visit with Dr. Serafina Royals. Our office will call you to set up the appointment. Contact information: [address] [place] [address] [phone]                  Electronically Signed: Tera Mater, PA-C 03/19/2022, 11:20 AM   I have spent Less Than 30 Minutes discharging [name].

## 2022-03-19 NOTE — Plan of Care (Signed)
Patient ID: [name], male   DOB: [birth year], 81 y.o.   MRN: [id number]  Problem: Education: Goal: Understanding of CV disease, CV risk reduction, and recovery process will improve Outcome: Adequate for Discharge Goal: Individualized Educational Video(s) Outcome: Adequate for Discharge   Problem: Activity: Goal: Ability to return to baseline activity level will improve Outcome: Adequate for Discharge   Problem: Cardiovascular: Goal: Ability to achieve and maintain adequate cardiovascular perfusion will improve Outcome: Adequate for Discharge Goal: Vascular access site(s) Level 0-1 will be maintained Outcome: Adequate for Discharge   Problem: Health Behavior/Discharge Planning: Goal: Ability to safely manage health-related needs after discharge will improve Outcome: Adequate for Discharge   Problem: Education: Goal: Knowledge of General Education information will improve Description: Including pain rating scale, medication(s)/side effects and non-pharmacologic comfort measures Outcome: Adequate for Discharge   Problem: Health Behavior/Discharge Planning: Goal: Ability to manage health-related needs will improve Outcome: Adequate for Discharge   Problem: Clinical Measurements: Goal: Ability to maintain clinical measurements within normal limits will improve Outcome: Adequate for Discharge Goal: Will remain free from infection Outcome: Adequate for Discharge Goal: Diagnostic test results will improve Outcome: Adequate for Discharge Goal: Respiratory complications will improve Outcome: Adequate for Discharge Goal: Cardiovascular complication will be avoided Outcome: Adequate for Discharge   Problem: Activity: Goal: Risk for activity intolerance will decrease Outcome: Adequate for Discharge   Problem: Nutrition: Goal: Adequate nutrition will be maintained Outcome: Adequate for Discharge   Problem: Coping: Goal: Level of anxiety will decrease Outcome: Adequate for  Discharge   Problem: Elimination: Goal: Will not experience complications related to bowel motility Outcome: Adequate for Discharge Goal: Will not experience complications related to urinary retention Outcome: Adequate for Discharge   Problem: Pain Managment: Goal: General experience of comfort will improve Outcome: Adequate for Discharge   Problem: Safety: Goal: Ability to remain free from injury will improve Outcome: Adequate for Discharge   Problem: Skin Integrity: Goal: Risk for impaired skin integrity will decrease Outcome: Adequate for Discharge  Haydee Salter, RN

## 2022-03-20 ENCOUNTER — Encounter (HOSPITAL_COMMUNITY): Payer: Self-pay | Admitting: Interventional Radiology

## 2022-03-20 LAB — CULTURE, BODY FLUID W GRAM STAIN -BOTTLE: Culture: NO GROWTH

## 2022-03-21 LAB — GLUCOSE, CAPILLARY: Glucose-Capillary: 157 mg/dL — ABNORMAL HIGH (ref 70–99)

## 2022-03-22 ENCOUNTER — Emergency Department (HOSPITAL_COMMUNITY): Payer: Medicare HMO

## 2022-03-22 ENCOUNTER — Telehealth: Payer: Self-pay | Admitting: Student

## 2022-03-22 ENCOUNTER — Inpatient Hospital Stay (HOSPITAL_COMMUNITY)
Admission: EM | Admit: 2022-03-22 | Discharge: 2022-04-07 | DRG: 441 | Disposition: A | Discharge status: Hospice | Payer: Medicare HMO | Attending: Internal Medicine | Admitting: Internal Medicine

## 2022-03-22 ENCOUNTER — Encounter (HOSPITAL_COMMUNITY): Payer: Self-pay | Admitting: Emergency Medicine

## 2022-03-22 ENCOUNTER — Other Ambulatory Visit: Payer: Self-pay

## 2022-03-22 ENCOUNTER — Ambulatory Visit: Payer: Medicare HMO | Admitting: Family Medicine

## 2022-03-22 ENCOUNTER — Telehealth: Payer: Self-pay | Admitting: Family Medicine

## 2022-03-22 DIAGNOSIS — N171 Acute kidney failure with acute cortical necrosis: Secondary | ICD-10-CM | POA: Diagnosis not present

## 2022-03-22 DIAGNOSIS — N170 Acute kidney failure with tubular necrosis: Secondary | ICD-10-CM | POA: Diagnosis present

## 2022-03-22 DIAGNOSIS — K7581 Nonalcoholic steatohepatitis (NASH): Secondary | ICD-10-CM | POA: Diagnosis present

## 2022-03-22 DIAGNOSIS — K259 Gastric ulcer, unspecified as acute or chronic, without hemorrhage or perforation: Secondary | ICD-10-CM | POA: Diagnosis present

## 2022-03-22 DIAGNOSIS — Z923 Personal history of irradiation: Secondary | ICD-10-CM

## 2022-03-22 DIAGNOSIS — I959 Hypotension, unspecified: Secondary | ICD-10-CM | POA: Diagnosis not present

## 2022-03-22 DIAGNOSIS — E860 Dehydration: Secondary | ICD-10-CM

## 2022-03-22 DIAGNOSIS — J9811 Atelectasis: Secondary | ICD-10-CM | POA: Diagnosis not present

## 2022-03-22 DIAGNOSIS — Z711 Person with feared health complaint in whom no diagnosis is made: Secondary | ICD-10-CM | POA: Diagnosis not present

## 2022-03-22 DIAGNOSIS — K760 Fatty (change of) liver, not elsewhere classified: Secondary | ICD-10-CM | POA: Diagnosis not present

## 2022-03-22 DIAGNOSIS — R627 Adult failure to thrive: Secondary | ICD-10-CM

## 2022-03-22 DIAGNOSIS — I9589 Other hypotension: Secondary | ICD-10-CM

## 2022-03-22 DIAGNOSIS — K269 Duodenal ulcer, unspecified as acute or chronic, without hemorrhage or perforation: Secondary | ICD-10-CM | POA: Diagnosis present

## 2022-03-22 DIAGNOSIS — I5022 Chronic systolic (congestive) heart failure: Secondary | ICD-10-CM | POA: Diagnosis present

## 2022-03-22 DIAGNOSIS — K6389 Other specified diseases of intestine: Secondary | ICD-10-CM | POA: Diagnosis not present

## 2022-03-22 DIAGNOSIS — Z66 Do not resuscitate: Secondary | ICD-10-CM | POA: Diagnosis present

## 2022-03-22 DIAGNOSIS — M542 Cervicalgia: Secondary | ICD-10-CM | POA: Diagnosis not present

## 2022-03-22 DIAGNOSIS — K7460 Unspecified cirrhosis of liver: Secondary | ICD-10-CM | POA: Diagnosis not present

## 2022-03-22 DIAGNOSIS — Z7401 Bed confinement status: Secondary | ICD-10-CM

## 2022-03-22 DIAGNOSIS — J449 Chronic obstructive pulmonary disease, unspecified: Secondary | ICD-10-CM | POA: Diagnosis present

## 2022-03-22 DIAGNOSIS — R531 Weakness: Secondary | ICD-10-CM | POA: Diagnosis not present

## 2022-03-22 DIAGNOSIS — K766 Portal hypertension: Secondary | ICD-10-CM | POA: Diagnosis present

## 2022-03-22 DIAGNOSIS — C9110 Chronic lymphocytic leukemia of B-cell type not having achieved remission: Secondary | ICD-10-CM | POA: Diagnosis present

## 2022-03-22 DIAGNOSIS — R579 Shock, unspecified: Secondary | ICD-10-CM

## 2022-03-22 DIAGNOSIS — K7031 Alcoholic cirrhosis of liver with ascites: Secondary | ICD-10-CM | POA: Diagnosis not present

## 2022-03-22 DIAGNOSIS — C220 Liver cell carcinoma: Secondary | ICD-10-CM | POA: Diagnosis present

## 2022-03-22 DIAGNOSIS — N179 Acute kidney failure, unspecified: Secondary | ICD-10-CM | POA: Diagnosis not present

## 2022-03-22 DIAGNOSIS — N185 Chronic kidney disease, stage 5: Secondary | ICD-10-CM | POA: Diagnosis present

## 2022-03-22 DIAGNOSIS — E440 Moderate protein-calorie malnutrition: Secondary | ICD-10-CM | POA: Diagnosis present

## 2022-03-22 DIAGNOSIS — E861 Hypovolemia: Secondary | ICD-10-CM | POA: Diagnosis not present

## 2022-03-22 DIAGNOSIS — K7469 Other cirrhosis of liver: Secondary | ICD-10-CM

## 2022-03-22 DIAGNOSIS — R1310 Dysphagia, unspecified: Secondary | ICD-10-CM | POA: Diagnosis not present

## 2022-03-22 DIAGNOSIS — I132 Hypertensive heart and chronic kidney disease with heart failure and with stage 5 chronic kidney disease, or end stage renal disease: Secondary | ICD-10-CM | POA: Diagnosis present

## 2022-03-22 DIAGNOSIS — F419 Anxiety disorder, unspecified: Secondary | ICD-10-CM | POA: Diagnosis present

## 2022-03-22 DIAGNOSIS — Z20822 Contact with and (suspected) exposure to covid-19: Secondary | ICD-10-CM | POA: Diagnosis present

## 2022-03-22 DIAGNOSIS — Z87891 Personal history of nicotine dependence: Secondary | ICD-10-CM

## 2022-03-22 DIAGNOSIS — Z8249 Family history of ischemic heart disease and other diseases of the circulatory system: Secondary | ICD-10-CM

## 2022-03-22 DIAGNOSIS — N1832 Chronic kidney disease, stage 3b: Secondary | ICD-10-CM

## 2022-03-22 DIAGNOSIS — Z7189 Other specified counseling: Secondary | ICD-10-CM | POA: Diagnosis not present

## 2022-03-22 DIAGNOSIS — L8990 Pressure ulcer of unspecified site, unspecified stage: Secondary | ICD-10-CM | POA: Insufficient documentation

## 2022-03-22 DIAGNOSIS — E1122 Type 2 diabetes mellitus with diabetic chronic kidney disease: Secondary | ICD-10-CM | POA: Diagnosis present

## 2022-03-22 DIAGNOSIS — F32A Depression, unspecified: Secondary | ICD-10-CM | POA: Diagnosis present

## 2022-03-22 DIAGNOSIS — N400 Enlarged prostate without lower urinary tract symptoms: Secondary | ICD-10-CM | POA: Diagnosis present

## 2022-03-22 DIAGNOSIS — I517 Cardiomegaly: Secondary | ICD-10-CM | POA: Diagnosis not present

## 2022-03-22 DIAGNOSIS — K3189 Other diseases of stomach and duodenum: Secondary | ICD-10-CM | POA: Diagnosis present

## 2022-03-22 DIAGNOSIS — K5790 Diverticulosis of intestine, part unspecified, without perforation or abscess without bleeding: Secondary | ICD-10-CM | POA: Diagnosis present

## 2022-03-22 DIAGNOSIS — E1142 Type 2 diabetes mellitus with diabetic polyneuropathy: Secondary | ICD-10-CM

## 2022-03-22 DIAGNOSIS — R188 Other ascites: Secondary | ICD-10-CM | POA: Diagnosis not present

## 2022-03-22 DIAGNOSIS — R791 Abnormal coagulation profile: Secondary | ICD-10-CM | POA: Diagnosis present

## 2022-03-22 DIAGNOSIS — E785 Hyperlipidemia, unspecified: Secondary | ICD-10-CM | POA: Diagnosis present

## 2022-03-22 DIAGNOSIS — K7682 Hepatic encephalopathy: Principal | ICD-10-CM

## 2022-03-22 DIAGNOSIS — Z79899 Other long term (current) drug therapy: Secondary | ICD-10-CM

## 2022-03-22 DIAGNOSIS — R571 Hypovolemic shock: Secondary | ICD-10-CM | POA: Diagnosis not present

## 2022-03-22 DIAGNOSIS — S59902A Unspecified injury of left elbow, initial encounter: Secondary | ICD-10-CM | POA: Diagnosis not present

## 2022-03-22 DIAGNOSIS — Z789 Other specified health status: Secondary | ICD-10-CM | POA: Diagnosis not present

## 2022-03-22 DIAGNOSIS — K767 Hepatorenal syndrome: Secondary | ICD-10-CM | POA: Diagnosis not present

## 2022-03-22 DIAGNOSIS — R012 Other cardiac sounds: Secondary | ICD-10-CM | POA: Diagnosis not present

## 2022-03-22 DIAGNOSIS — R54 Age-related physical debility: Secondary | ICD-10-CM | POA: Diagnosis present

## 2022-03-22 DIAGNOSIS — I700 Atherosclerosis of aorta: Secondary | ICD-10-CM | POA: Diagnosis not present

## 2022-03-22 DIAGNOSIS — K5900 Constipation, unspecified: Secondary | ICD-10-CM | POA: Diagnosis present

## 2022-03-22 DIAGNOSIS — Z6826 Body mass index (BMI) 26.0-26.9, adult: Secondary | ICD-10-CM

## 2022-03-22 DIAGNOSIS — I129 Hypertensive chronic kidney disease with stage 1 through stage 4 chronic kidney disease, or unspecified chronic kidney disease: Secondary | ICD-10-CM | POA: Diagnosis not present

## 2022-03-22 DIAGNOSIS — R638 Other symptoms and signs concerning food and fluid intake: Secondary | ICD-10-CM | POA: Diagnosis not present

## 2022-03-22 DIAGNOSIS — E875 Hyperkalemia: Secondary | ICD-10-CM | POA: Diagnosis present

## 2022-03-22 DIAGNOSIS — I8510 Secondary esophageal varices without bleeding: Secondary | ICD-10-CM | POA: Diagnosis present

## 2022-03-22 DIAGNOSIS — Z515 Encounter for palliative care: Secondary | ICD-10-CM | POA: Diagnosis not present

## 2022-03-22 DIAGNOSIS — D696 Thrombocytopenia, unspecified: Secondary | ICD-10-CM | POA: Diagnosis present

## 2022-03-22 DIAGNOSIS — L89151 Pressure ulcer of sacral region, stage 1: Secondary | ICD-10-CM | POA: Diagnosis present

## 2022-03-22 DIAGNOSIS — E876 Hypokalemia: Secondary | ICD-10-CM | POA: Diagnosis present

## 2022-03-22 DIAGNOSIS — Z4682 Encounter for fitting and adjustment of non-vascular catheter: Secondary | ICD-10-CM | POA: Diagnosis not present

## 2022-03-22 DIAGNOSIS — Z452 Encounter for adjustment and management of vascular access device: Secondary | ICD-10-CM | POA: Diagnosis not present

## 2022-03-22 DIAGNOSIS — Z043 Encounter for examination and observation following other accident: Secondary | ICD-10-CM | POA: Diagnosis not present

## 2022-03-22 DIAGNOSIS — R0603 Acute respiratory distress: Secondary | ICD-10-CM | POA: Diagnosis not present

## 2022-03-22 HISTORY — DX: Acute kidney failure, unspecified: N17.9

## 2022-03-22 LAB — COMPREHENSIVE METABOLIC PANEL
ALT: 293 U/L — ABNORMAL HIGH (ref 0–44)
AST: 545 U/L — ABNORMAL HIGH (ref 15–41)
Albumin: 2.5 g/dL — ABNORMAL LOW (ref 3.5–5.0)
Alkaline Phosphatase: 393 U/L — ABNORMAL HIGH (ref 38–126)
Anion gap: 4 — ABNORMAL LOW (ref 5–15)
BUN: 57 mg/dL — ABNORMAL HIGH (ref 8–23)
CO2: 22 mmol/L (ref 22–32)
Calcium: 8.6 mg/dL — ABNORMAL LOW (ref 8.9–10.3)
Chloride: 108 mmol/L (ref 98–111)
Creatinine, Ser: 4.78 mg/dL — ABNORMAL HIGH (ref 0.61–1.24)
GFR, Estimated: 12 mL/min — ABNORMAL LOW (ref >60)
Glucose, Bld: 162 mg/dL — ABNORMAL HIGH (ref 70–99)
Potassium: 5.5 mmol/L — ABNORMAL HIGH (ref 3.5–5.1)
Sodium: 134 mmol/L — ABNORMAL LOW (ref 135–145)
Total Bilirubin: 1.8 mg/dL — ABNORMAL HIGH (ref 0.3–1.2)
Total Protein: 5 g/dL — ABNORMAL LOW (ref 6.5–8.1)

## 2022-03-22 LAB — CBC WITH DIFFERENTIAL/PLATELET
Abs Immature Granulocytes: 0.02 10*3/uL (ref 0.00–0.07)
Basophils Absolute: 0 10*3/uL (ref 0.0–0.1)
Basophils Relative: 0 %
Eosinophils Absolute: 0 10*3/uL (ref 0.0–0.5)
Eosinophils Relative: 1 %
HCT: 28.3 % — ABNORMAL LOW (ref 39.0–52.0)
Hemoglobin: 8.7 g/dL — ABNORMAL LOW (ref 13.0–17.0)
Immature Granulocytes: 0 %
Lymphocytes Relative: 12 %
Lymphs Abs: 0.6 10*3/uL — ABNORMAL LOW (ref 0.7–4.0)
MCH: 30.3 pg (ref 26.0–34.0)
MCHC: 30.7 g/dL (ref 30.0–36.0)
MCV: 98.6 fL (ref 80.0–100.0)
Monocytes Absolute: 0.4 10*3/uL (ref 0.1–1.0)
Monocytes Relative: 8 %
Neutro Abs: 4 10*3/uL (ref 1.7–7.7)
Neutrophils Relative %: 79 %
Platelets: 87 10*3/uL — ABNORMAL LOW (ref 150–400)
RBC: 2.87 MIL/uL — ABNORMAL LOW (ref 4.22–5.81)
RDW: 20 % — ABNORMAL HIGH (ref 11.5–15.5)
WBC: 5.1 10*3/uL (ref 4.0–10.5)
nRBC: 0 % (ref 0.0–0.2)

## 2022-03-22 LAB — GLUCOSE, CAPILLARY: Glucose-Capillary: 170 mg/dL — ABNORMAL HIGH (ref 70–99)

## 2022-03-22 LAB — LACTIC ACID, PLASMA
Lactic Acid, Venous: 1.9 mmol/L (ref 0.5–1.9)
Lactic Acid, Venous: 1.7 mmol/L (ref 0.5–1.9)

## 2022-03-22 LAB — AMMONIA: Ammonia: 33 umol/L (ref 9–35)

## 2022-03-22 LAB — PROTIME-INR
INR: 1.5 — ABNORMAL HIGH (ref 0.8–1.2)
Prothrombin Time: 17.8 s — ABNORMAL HIGH (ref 11.4–15.2)

## 2022-03-22 LAB — CBG MONITORING, ED: Glucose-Capillary: 153 mg/dL — ABNORMAL HIGH (ref 70–99)

## 2022-03-22 LAB — APTT: aPTT: 40 s — ABNORMAL HIGH (ref 24–36)

## 2022-03-22 MED ORDER — SODIUM CHLORIDE 0.9% FLUSH: 10.0000 mL | INTRAVENOUS | Status: DC | PRN

## 2022-03-22 MED ORDER — HEPARIN SODIUM (PORCINE) 5000 UNIT/ML IJ SOLN
5000.0000 [IU] | Freq: Three times a day (TID) | INTRAMUSCULAR | Status: DC
  Administered 2022-03-23 – 2022-04-04 (×37): 5000 [IU] via SUBCUTANEOUS
  Filled 2022-03-22 (×37): qty 1

## 2022-03-22 MED ORDER — TRIPLE ANTIBIOTIC 3.5-400-5000 EX OINT
TOPICAL_OINTMENT | Freq: Once | CUTANEOUS | Status: DC
  Filled 2022-03-22 (×3): qty 1

## 2022-03-22 MED ORDER — CHLORHEXIDINE GLUCONATE CLOTH 2 % EX PADS
6.0000 | MEDICATED_PAD | Freq: Every day | CUTANEOUS | Status: DC
  Administered 2022-03-22 – 2022-03-27 (×6): 6 via TOPICAL

## 2022-03-22 MED ORDER — CEFTRIAXONE SODIUM 2 G IJ SOLR
2.0000 g | INTRAMUSCULAR | Status: DC
  Administered 2022-03-23 (×2): 2 g via INTRAVENOUS

## 2022-03-22 MED ORDER — NOREPINEPHRINE 4 MG/250ML-% IV SOLN
0.0000 ug/min | INTRAVENOUS | Status: DC
  Administered 2022-03-22: 25 ug/min via INTRAVENOUS
  Administered 2022-03-22: 29 ug/min via INTRAVENOUS
  Administered 2022-03-23: 21 ug/min via INTRAVENOUS
  Filled 2022-03-22 (×2): qty 250

## 2022-03-22 MED ORDER — SODIUM CHLORIDE 0.9 % IV SOLN
250.0000 mL | INTRAVENOUS | Status: DC
  Administered 2022-03-22: 250 mL via INTRAVENOUS
  Administered 2022-03-25: 10 mL/h via INTRAVENOUS
  Administered 2022-04-04: 250 mL via INTRAVENOUS

## 2022-03-22 MED ORDER — DOCUSATE SODIUM 100 MG PO CAPS
100.0000 mg | ORAL_CAPSULE | Freq: Two times a day (BID) | ORAL | Status: DC | PRN
  Administered 2022-03-24: 100 mg via ORAL
  Filled 2022-03-22: qty 1

## 2022-03-22 MED ORDER — SODIUM CHLORIDE 0.9 % IV BOLUS
1000.0000 mL | Freq: Once | INTRAVENOUS | Status: AC
  Administered 2022-03-22: 1000 mL via INTRAVENOUS

## 2022-03-22 MED ORDER — SODIUM CHLORIDE 0.9% FLUSH
10.0000 mL | Freq: Two times a day (BID) | INTRAVENOUS | Status: DC
  Administered 2022-03-22: 20 mL
  Administered 2022-03-23 – 2022-03-25 (×6): 10 mL

## 2022-03-22 MED ORDER — NOREPINEPHRINE 4 MG/250ML-% IV SOLN
2.0000 ug/min | INTRAVENOUS | Status: DC
  Administered 2022-03-22: 2 ug/min via INTRAVENOUS
  Filled 2022-03-22: qty 250

## 2022-03-22 MED ORDER — CHLORHEXIDINE GLUCONATE CLOTH 2 % EX PADS: 6.0000 | MEDICATED_PAD | Freq: Every day | CUTANEOUS | Status: DC

## 2022-03-22 MED ORDER — POLYETHYLENE GLYCOL 3350 17 G PO PACK: 17.0000 g | PACK | Freq: Every day | ORAL | Status: DC | PRN

## 2022-03-22 MED ORDER — SODIUM CHLORIDE 0.9 % IV SOLN
2.0000 g | INTRAVENOUS | Status: DC
  Filled 2022-03-22: qty 20

## 2022-03-22 MED ORDER — SODIUM CHLORIDE 0.9 % IV BOLUS
500.0000 mL | Freq: Once | INTRAVENOUS | Status: DC
  Administered 2022-03-22: 500 mL via INTRAVENOUS

## 2022-03-22 MED ORDER — PANTOPRAZOLE SODIUM 40 MG PO TBEC
40.0000 mg | DELAYED_RELEASE_TABLET | Freq: Every day | ORAL | Status: DC
  Administered 2022-03-23 – 2022-03-24 (×2): 40 mg via ORAL
  Filled 2022-03-22 (×2): qty 1

## 2022-03-22 NOTE — ED Notes (Signed)
Patient transported to MRI 

## 2022-03-22 NOTE — ED Triage Notes (Signed)
Pt had procedure on Friday to tap belly for Cirrhosis of liver, on Saturday pt fell trying to go to bathroom, injuring neck and left arm, since procedure pt has had decrease intake and output.

## 2022-03-22 NOTE — Telephone Encounter (Signed)
FYI-- Patient's spouse, Thayer Headings, called to cancel patient's visit for 03/22/22 for 3:20 pm. States patient is currently in the ER due to having a fall shortly after having a procedure on his liver. She will callback to reschedule upon his discharge.

## 2022-03-22 NOTE — ED Notes (Signed)
Talked with EDP and pt to be a full code at his this due to not being in the mental capacity to tell if wants to be a full code or DNR at this time.

## 2022-03-22 NOTE — ED Provider Notes (Addendum)
[facility] Provider Note   CSN: [id number] Arrival date & time: 03/22/22  1223     History  Chief Complaint  Patient presents with   [name] is a 81 y.o. male who has a history significant for advanced liver cirrhosis having undergone a TIPS procedure 4 days ago at [facility], has been undergoing weekly paracentesis secondary to ascites presenting today with increased weakness, poor appetite and a fall which occurred 3 days ago.  He was discharged to his home 3 days ago and while ambulating to the bathroom (usually uses a walker) he fell and sustained wounds to his left arm which they have been dressing with antibiotic ointment and wraps.  He states he fell secondary to generalized weakness and has been unable to ambulate since this fall due to worsening weakness.  Daughter at the bedside says he has had almost no p.o. intake and has had reduced urination and no bowel movement since coming home from the hospital 3 days ago.  He has had no fevers, he denies abdominal pain, chest pain, abdominal distention, no recognized fevers.  He does use as needed home oxygen, denies any increased shortness of breath beyond his baseline.  Other medical history significant for hypertension, type 2 diabetes, history of liver cancer under the care of Dr. Delton Coombes, iron deficiency anemia.  Patient does have a DNR CODE STATUS.  The history is provided by the patient, a relative and the spouse (Daughter and wife at bedside).       Home Medications Prior to Admission medications   Medication Sig Start Date End Date Taking? Authorizing Provider  albuterol (VENTOLIN HFA) 108 (90 Base) MCG/ACT inhaler Inhale 2 puffs into the lungs every 6 (six) hours as needed for wheezing or shortness of breath. 11/01/21   [name], MD  busPIRone (BUSPAR) 7.5 MG tablet Take 1 tablet (7.5 mg total) by mouth 2 (two) times daily as needed. for anxiety Patient taking differently:  Take 7.5 mg by mouth See admin instructions. Take 7.5 mg tablet by mouth at bedtime, may take a second 7.5 mg dose as needed for anxiety 05/14/21   [name], MD  calcium carbonate (TUMS EX) 750 MG chewable tablet Chew 2 tablets by mouth daily as needed for heartburn.    [provider]  carvedilol (COREG) 6.25 MG tablet Take 1 tablet (6.25 mg total) by mouth 2 (two) times daily with a meal. 02/01/22   [name], MD  ciprofloxacin (CIPRO) 250 MG tablet Take 1 tablet (250 mg total) by mouth daily. 02/09/22   Pyrtle, Lajuan Lines, MD  ENTRESTO 24-26 MG Take 1 tablet by mouth 2 (two) times daily. 01/26/22   [provider]  escitalopram (LEXAPRO) 5 MG tablet Take 1 tablet (5 mg total) by mouth daily. 05/14/21   [name], MD  finasteride (PROSCAR) 5 MG tablet TAKE 1 TABLET EVERY DAY Patient taking differently: Take 5 mg by mouth daily. 01/10/22   [name], MD  furosemide (LASIX) 20 MG tablet Take 20 mg by mouth daily. 01/04/22   [provider]  gabapentin (NEURONTIN) 300 MG capsule TAKE 1 CAPSULE TWICE DAILY Patient taking differently: Take 300 mg by mouth 2 (two) times daily. 03/15/21   [name], MD  lactulose (CHRONULAC) 10 GM/15ML solution Take 30 mLs (20 g total) by mouth 3 (three) times daily. 03/19/22 04/18/22  Han, Aimee H, PA-C  oxyCODONE-acetaminophen (PERCOCET) 10-325 MG tablet  Take 1 tablet by mouth every 8 (eight) hours as needed for pain (chronic pain in cancer patient with cirrhosis). 03/10/22 07-May-2022  [name], MD  pantoprazole (PROTONIX) 40 MG tablet Take 1 tablet (40 mg total) by mouth 2 (two) times daily. 02/01/22   [name], MD  prednisoLONE acetate (PRED FORTE) 1 % ophthalmic suspension Place 1 drop into the right eye 2 (two) times daily. 01/03/22   [provider]  rifaximin (XIFAXAN) 550 MG TABS tablet Take 1 tablet (550 mg total) by mouth 2 (two) times daily. 02/14/22   Pyrtle, Lajuan Lines, MD  rosuvastatin (CRESTOR) 5  MG tablet TAKE 1 TABLET EVERY DAY Patient taking differently: Take 5 mg by mouth daily. 11/16/21   [name], MD  sucralfate (CARAFATE) 1 g tablet Take 1 tablet (1 g total) by mouth 4 (four) times daily. 02/03/22 02/03/23  Mauri Pole, MD  tamsulosin (FLOMAX) 0.4 MG CAPS capsule TAKE 1 CAPSULE EVERY DAY Patient taking differently: Take 0.4 mg by mouth daily. 03/14/22   [name], MD      Allergies    Patient has no known allergies.    Review of Systems   Review of Systems  Constitutional:  Positive for activity change, appetite change and fatigue. Negative for fever.  HENT:  Negative for congestion and sore throat.   Eyes: Negative.   Respiratory:  Negative for chest tightness and shortness of breath.   Cardiovascular:  Negative for chest pain.  Gastrointestinal:  Negative for abdominal pain, nausea and vomiting.  Genitourinary: Negative.   Musculoskeletal:  Positive for neck pain. Negative for arthralgias and joint swelling.  Skin:  Positive for wound. Negative for rash.  Neurological:  Positive for weakness. Negative for dizziness, light-headedness, numbness and headaches.  Psychiatric/Behavioral: Negative.      Physical Exam Updated Vital Signs BP (!) 89/35   Pulse 78   Temp 97.6 F (36.4 C) (Oral)   Resp 13   Ht '5\' 10"'$  (1.778 m)   Wt 79 kg   SpO2 96%   BMI 24.99 kg/m  Physical Exam Vitals and nursing note reviewed.  Constitutional:      General: He is not in acute distress.    Appearance: He is well-developed. He is ill-appearing.  HENT:     Head: Normocephalic and atraumatic.     Mouth/Throat:     Mouth: Mucous membranes are dry.  Cardiovascular:     Rate and Rhythm: Normal rate and regular rhythm.     Heart sounds: Normal heart sounds.  Pulmonary:     Effort: Pulmonary effort is normal.     Breath sounds: Normal breath sounds. No wheezing.  Abdominal:     General: Bowel sounds are normal.     Palpations: Abdomen is soft.     Tenderness:  There is no abdominal tenderness.  Musculoskeletal:        General: Normal range of motion.     Cervical back: Normal range of motion. Tenderness present.  Skin:    General: Skin is warm and dry.  Neurological:     Mental Status: He is alert.     ED Results / Procedures / Treatments   Labs (all labs ordered are listed, but only abnormal results are displayed) Labs Reviewed  CBC WITH DIFFERENTIAL/PLATELET - Abnormal; Notable for the following components:      Result Value   RBC 2.87 (*)    Hemoglobin 8.7 (*)    HCT 28.3 (*)  RDW 20.0 (*)    Platelets 87 (*)    Lymphs Abs 0.6 (*)    All other components within normal limits  COMPREHENSIVE METABOLIC PANEL - Abnormal; Notable for the following components:   Sodium 134 (*)    Potassium 5.5 (*)    Glucose, Bld 162 (*)    BUN 57 (*)    Creatinine, Ser 4.78 (*)    Calcium 8.6 (*)    Total Protein 5.0 (*)    Albumin 2.5 (*)    AST 545 (*)    ALT 293 (*)    Alkaline Phosphatase 393 (*)    Total Bilirubin 1.8 (*)    GFR, Estimated 12 (*)    Anion gap 4 (*)    All other components within normal limits  PROTIME-INR - Abnormal; Notable for the following components:   Prothrombin Time 17.8 (*)    INR 1.5 (*)    All other components within normal limits  APTT - Abnormal; Notable for the following components:   aPTT 40 (*)    All other components within normal limits  CBG MONITORING, ED - Abnormal; Notable for the following components:   Glucose-Capillary 153 (*)    All other components within normal limits  CULTURE, BLOOD (ROUTINE X 2)  CULTURE, BLOOD (ROUTINE X 2)  LACTIC ACID, PLASMA  AMMONIA  LACTIC ACID, PLASMA  URINALYSIS, ROUTINE W REFLEX MICROSCOPIC    EKG EKG Interpretation  Date/Time:  Tuesday March 22 2022 12:41:37 EDT Ventricular Rate:  79 PR Interval:  156 QRS Duration: 104 QT Interval:  384 QTC Calculation: 440 R Axis:   -21 Text Interpretation: Normal sinus rhythm Low voltage QRS Incomplete right  bundle branch block Borderline ECG When compared with ECG of 18-Mar-2022 08:23, No significant change was found Confirmed by Octaviano Glow [phone]) on 03/22/2022 3:21:36 PM  Radiology DG Chest Portable 1 View  Result Date: 03/22/2022 CLINICAL DATA:  Generalized weakness. History of congestive heart failure. EXAM: PORTABLE CHEST 1 VIEW COMPARISON:  11/02/2021 FINDINGS: Heart size upper limits of normal. Chronic aortic atherosclerosis. Probable small effusions, at least on the left. Minimal basilar atelectasis on the left. Upper lungs are clear. No widespread pulmonary edema. IMPRESSION: Aortic atherosclerosis. Probable pleural effusion, at least on the left, with mild left base volume loss. Electronically Signed   By: Nelson Chimes M.D.   On: 03/22/2022 14:47   CT Cervical Spine Wo Contrast  Result Date: 03/22/2022 CLINICAL DATA:  Neck pain extending into the left arm after falling 3 days ago. EXAM: CT CERVICAL SPINE WITHOUT CONTRAST TECHNIQUE: Multidetector CT imaging of the cervical spine was performed without intravenous contrast. Multiplanar CT image reconstructions were also generated. RADIATION DOSE REDUCTION: This exam was performed according to the departmental dose-optimization program which includes automated exposure control, adjustment of the mA and/or kV according to patient size and/or use of iterative reconstruction technique. COMPARISON:  None Available. FINDINGS: Despite efforts by the technologist and patient, mild motion artifact is present on today's exam and could not be eliminated. This reduces exam sensitivity and specificity. Alignment: 6 mm of anterolisthesis at C4-5.  Otherwise anatomic. Skull base and vertebrae: No evidence of acute fracture or traumatic subluxation. There is multilevel spondylosis with disc space narrowing, uncinate spurring and facet hypertrophy. Facet hypertrophy at C4-5 is felt to account for the anterolisthesis noted above. Soft tissues and spinal canal: No  prevertebral fluid or swelling. No visible canal hematoma. Bilateral carotid atherosclerosis. Disc levels: Multilevel spondylosis with disc space narrowing, uncinate  spurring and bilateral facet hypertrophy. At C3-4, there is moderate foraminal narrowing, worse on the left. At C4-5, there is mild to moderate spinal stenosis and severe foraminal narrowing bilaterally due to the anterolisthesis. Severe foraminal narrowing is also present bilaterally at C5-6 due to spondylosis. Asymmetric severe left foraminal narrowing at C6-7 due to spondylosis. No acute disc space findings identified. Upper chest: Unremarkable. Other: None. IMPRESSION: 1. No evidence of acute cervical spine fracture, traumatic subluxation or static signs of instability. Detail limited by motion artifact. 2. 6 mm of anterolisthesis due to facet disease at C4-5. 3. Multilevel spondylosis with resulting osseous foraminal narrowing as detailed above. Electronically Signed   By: Richardean Sale M.D.   On: 03/22/2022 13:36   DG Shoulder Left  Result Date: 03/22/2022 CLINICAL DATA:  Fall EXAM: LEFT SHOULDER - 2+ VIEW COMPARISON:  None Available. FINDINGS: There is no evidence of fracture or dislocation. There is no evidence of arthropathy or other focal bone abnormality. Soft tissues are unremarkable. IMPRESSION: Negative. Electronically Signed   By: Macy Mis M.D.   On: 03/22/2022 13:28   DG Elbow Complete Left  Result Date: 03/22/2022 CLINICAL DATA:  Trauma, fall, pain EXAM: LEFT ELBOW - COMPLETE 3+ VIEW COMPARISON:  None Available. FINDINGS: No recent fracture or dislocation is seen. There is no significant displacement of posterior fat pad. Small bony spurs are noted. There are scattered vascular calcifications in the soft tissues. IMPRESSION: No recent fracture or dislocation is seen in the left elbow. Electronically Signed   By: Elmer Picker M.D.   On: 03/22/2022 13:22    Procedures Procedures    Medications Ordered in  ED Medications  0.9 %  sodium chloride infusion (250 mLs Intravenous New Bag/Given 03/22/22 1642)  norepinephrine (LEVOPHED) '4mg'$  in 228m (0.016 mg/mL) premix infusion (6 mcg/min Intravenous Rate/Dose Change 03/22/22 1652)  neomycin-bacitracin-polymyxin [phone] OINT (has no administration in time range)  sodium chloride 0.9 % bolus 1,000 mL (0 mLs Intravenous Stopped 03/22/22 1649)    ED Course/ Medical Decision Making/ A&P Clinical Course as of 03/22/22 1728  Tue [birth year], [id number] old male with a history of cirrhosis presenting back to the ED with generalized weakness.  Patient underwent paracentesis earlier this week.  He reports he has been too weak to stand or get around his house.  He lives at home with his wife.  He was also having lightheadedness, and family reported some confusion at home.  On arrival his initial blood pressure was quite low, but has been improving with IV fluids.  He appears clinically dry on exam, with tacky mucous membranes.  He has also hypoxic requiring 2 L nasal cannula.  X-ray showing a pleural effusion which is likely related to his liver disease, or transudative.  Lower suspicion for pneumonia.  His abdominal exam is benign and I doubt SBP.  He has no complaint of chest pain or abdominal pain, and he is oriented x3 on my examination.  Suspect his are likely the sequelae of his ongoing chronic medical comorbidities, which there are many.  We will check labs, give a bolus of fluids for his low blood pressure, anticipate admission for dehydration and hypoxia.  The patient is DNR/DNI [MT]  1633 After 1 L IVF patient's pressure still 80/40, map < 60.  Peripheral 18 gauge IV placed for peripheral levophed, proximal right arm.  Patient had TIPS procedure performed through [facility] [MT]    Clinical Course User Index [MT] TLangston MaskerMCarola Rhine MD  Medical Decision Making Patient with end-stage liver disease, status post TIPS placement 4 days ago  presenting with increasing weakness, poor p.o. intake and failure to thrive at home.  He does appear to be significantly dehydrated clinically, his blood pressures are ow with systolic numbers in the 13Y to 80s, his map remains in the mid 50 range despite IV fluids, he has received 1 L normal saline.  His labs today are significant for elevated transaminases with an AST of 545, ALT of 293.  He is also in acute renal failure with a creatinine of 4.78.  A second peripheral IV was started, Levophed was started.  He will need hospitalization for dehydration, acute renal failure, also is hypoxic on today's presentation although denies shortness of breath, his oxygen's have been mid 80s to low 90s, improves on 2 L nasal cannula.  Has had increased weakness, fall 2 days ago with left-sided arm skin tear, wound is superficial and was dressed.   Amount and/or Complexity of Data Reviewed Labs: ordered.    Details: Per above. Radiology: ordered.    Details: Imaging reviewed given recent fall, patient with complaint plaints of C-spine pain CT imaging negative for acute injury.  Plain films of the left shoulder and elbow also obtained, no acute findings.  Chest x-ray revealing for a pleural effusion, probably secondary to his liver failure. Discussion of management or test interpretation with external provider(s): Pt discussed with critical care - Dr. Halford Chessman,  agreed with pt staying at [facility], will consult if hospitalists ask for consult.   Call placed to Dr. Denton Brick with the hospitalist service.  She does accept this patient for admission, but would like to see better blood pressures before she moves him upstairs,,ideally she would like his systolics to be in the 86V or better.  Patient was given additional IV fluids, his blood pressures persisted in the 80s with a MAP less than 60, therefore Levophed was started.  We maxed out on this medication and he remains in the 80s, although he initially responded with several  blood pressures in the mid 78-469 systolic range.  Patient was discussed again with  intensivist, Dr. Patsey Berthold at [facility] who accepts this patient for admission at the ICU at [facility].  Risk Decision regarding hospitalization.   CRITICAL CARE Performed by: Evalee [name] Total critical care time: 45 minutes Critical care time was exclusive of separately billable procedures and treating other patients. Critical care was necessary to treat or prevent imminent or life-threatening deterioration. Critical care was time spent personally by me on the following activities: development of treatment plan with patient and/or surrogate as well as nursing, discussions with consultants, evaluation of patient's response to treatment, examination of patient, obtaining history from patient or surrogate, ordering and performing treatments and interventions, ordering and review of laboratory studies, ordering and review of radiographic studies, pulse oximetry and re-evaluation of patient's condition.        Final Clinical Impression(s) / ED Diagnoses Final diagnoses:  Dehydration  Alcoholic cirrhosis of liver with ascites ([place])  Hypotension due to hypovolemia  Acute renal failure, unspecified acute renal failure type [facility])    Rx / DC Orders ED Discharge Orders     None         Landis Martins 03/22/22 1920    Evalee [name], PA-C 03/22/22 1922    Wyvonnia Dusky, MD 03/22/22 [phone]

## 2022-03-22 NOTE — H&P (Signed)
NAME:  [name], MRN:  [id number], DOB:  18-Mar-1941, LOS: 1 ADMISSION DATE:  03/22/2022 CONSULTATION DATE:  03/23/2022 REFERRING MD:  Langston Masker - EDP ([facility]) CHIEF COMPLAINT:  Hypotension, FTT  History of Present Illness:  81 year old man who presented to [facility] 6/13 for weakness, decreased mobility and poor PO intake s/p fall 3 days PTA. PMHx significant for HTN, HLD, mild AS, HFrEF (EF as low as 20-25%, recovered with Echo 02/2022 EF 55-60%, G1DD), COPD, prior tobacco use, T2DM, NASH cirrhosis with portal hypertension as evidenced by EV, PHG, ascites requiring frequent paracentesis (c/b SBP) and HCC (s/p radiation), gastric/duodenal ulcers and diverticulosis, IDA. Recently underwent TIPS placement with IR 6/9 with gradient reduction from 13 to 2. Monitored x 24H and discharged home 6/10.  Patient reportedly had a fall 6/10 shortly after arriving home, sustaining a L arm injury. Since injury, patient's mobility has been decreased and appetite has been poor; additionally reporting decreased UOP and constipation. Patient was advised by IR to present to ED for further evaluation. Presented to [facility] ED 6/13 for weakness, decreased mobility and poor PO intake s/p fall. Vitals on ED arrival were notable for BP 89/35. Labs notable for new transaminitis with AST 545/ALT 293, Cr 4.78 (baseline 1.3-1.8), INR 1.5, WBC/LA WNL. Hgb 8.7, near baseline. CXR with small L pleural effusion. CT C-spine and L arm/shoulder imaging negative. Fluid resuscitation was initiated (3L) with slight improvement in SBPs to 80s. RIJ CVC placed and Levophed was initiated for additional BP support.  PCCM consulted for ICU admission/transfer to [facility].  Pertinent Medical History:   Past Medical History:  Diagnosis Date   Aortic atherosclerosis ([place])    Benign prostatic hypertrophy    Blood transfusion without reported diagnosis    CHF (congestive heart failure) (HCC)    a. EF 20-25% by echo in 11/2019   COPD (chronic obstructive  pulmonary disease) ([place]) 12/15/2009   FEV1 2.30 (70%) ratio 63 no better with B2 and DLCO 18/6 (73%) corrects to 106%   Degenerative joint disease    Depression    Diverticulosis    Duodenal varices    Esophageal varices (HCC)    Essential hypertension    ACE inhibitor cough   Fatty liver    Gastric ulcer    Hepatic cirrhosis (HCC)    Hepatocellular carcinoma (HCC)    Hyperlipidemia    IDA (iron deficiency anemia)    Internal hemorrhoids    Liver cancer ([place])    Low back pain    Otitis externa 07/30/2012   Pneumonia    Portal hypertensive gastropathy (HCC)    SBP (spontaneous bacterial peritonitis) ([place])    Sepsis (HCC)    Splenomegaly    Thrombocytopenia (HCC)    Type 2 diabetes mellitus ([place])    no meds   Ulcer of foot ([place])    Right foot   Significant Hospital Events: Including procedures, antibiotic start and stop dates in addition to other pertinent events   6/9 - TIPS placement at [facility] with IR, tolerated procedure well. Gradient reduced from 13 to 2. 6/10 - Discharged home. Sustained fall at home and L arm injury (imaging negative for fx/dislocation). 6/13 - Presented to [facility] for weakness, poor PO intake, FTT post-fall. Decreased UOP/constipation. Lab findings notable for new transaminitis, acute renal failure. Hypotensive requiring fluid resuscitation and pressor initiation. Transfer to [facility] for ICU admission/further management.  Interim History / Subjective:  Transferred from [facility] to [facility] for hypotension s/p TIPS 6/9 Reports not feeling well since  procedure Prior to procedure was at his baseline No pain at present, but feels like he "can't get warm" Mental status waxing and waning, appears very fatigued  Objective:  Blood pressure (!) 92/55, pulse 72, temperature 97.7 F (36.5 C), temperature source Oral, resp. rate 12, height _0  (1.778 m), weight 79 kg, SpO2 100 %.        Intake/Output Summary (Last 24 hours) at 03/23/2022 0037 Last data filed at 03/22/2022  2200 Gross per 24 hour  Intake 1019 ml  Output --  Net 1019 ml   Filed Weights   03/22/22 1235  Weight: 79 kg   Physical Examination: General: Chronically ill-appearing elderly man in NAD. Appears fatigued. HEENT: [place]/AT, anicteric sclera, PERRL, dry mucous membranes. Neuro: Awake, oriented x 2 (self, location). Responds to verbal stimuli. Following commands consistently. Moves all 4 extremities spontaneously. Generalized weakness noted. CV: RRR, +SEM, III/VI. PULM: Breathing even and unlabored on 2L [place]. Lung fields CTAB anteriorly. GI: Soft, nontender, mildly distended. Hypoactive bowel sounds. Extremities: Trace bilateral symmetric LE edema noted. Skin: Warm/dry, no rashes. LE darkening c/w venous stasis.  Resolved Hospital Problem List:    Assessment & Plan:  Hypotension with concern for developing shock - Admit to [facility] ICU as transfer from [facility] - Goal MAP > 65 - Fluid resuscitation as tolerated - Levophed titrated to goal MAP - Add vasopressin - Trend WBC, fever curve; currently does not appear infected - Trend LA, normal at present - F/u Abdominal US - F/u Cx data - Empiric coverage with ceftriaxone, given recent TIPS  NASH cirrhosis Portal hypertension Esophageal varices, PHG Ascites, s/p TIPS HCC S/p TIPS with IR 6/9, discharged 24H post-procedure. Tolerated procedure well with gradient reduction from 13 to 2. - Continue Rifaximin - Resume lactulose - Hold home Lasix in the setting of dehydration, AKI/ARF - Monitor for worsening signs of HE post-TIPS - Surveillance monitoring for [facility] per protocol  Acute renal failure, likely 2/2 ATN due to hypotension and dehydration in the setting of CKD - Trend BMP - Replete electrolytes as indicated - Aggressive fluid resuscitation - Monitor I&Os - F/u urine studies - Avoid nephrotoxic agents as able - Ensure adequate renal perfusion - Nephrology consult if no significant improvement in Cr/UOP  HFrEF (EF improved to  55-60%, previously ~20%) Mild AS Hypertension Hyperlipidemia Most recent Echo 02/2022 with EF 55-60%, G1DD. - Hold home Entresto in the setting of severe AKI/ARF - Hold home Coreg in the setting of hypotension - Continue statin - Cardiac monitoring - Monitor I&Os, goal euvolemia  COPD Prior tobacco use - Continue supplemental O2 support - Wean O2 for sat > 90% - Bronchodilators PRN - Pulmonary hygiene, IS - Encourage mobility  IDA Thrombocytopenia Elevated INR in the setting of liver dysfunction - Trend CBC - Transfuse for Hgb < 7.0 or hemodynamically significant bleeding - Transfuse for Plt < 20K  T2DM - CBGs Q4H while PO intake remains poor - Goal 140s-180s - SSI  Gastric and duodenal ulcers Diverticulosis - Continue PPI  Depression, anxiety - Resume home Lexapro, Buspar  FTT - Nutrition consult for assessment of caloric needs, supplement addition - 2G Na diet - PT/OT evaluation, may benefit from home health therapies  Best Practice: (right click and "Reselect all SmartList Selections" daily)   Diet/type: Regular consistency (see orders) 2g Na DVT prophylaxis: SCDs, consider SQH monitoring Plt/INR GI prophylaxis: PPI Lines: Central line Foley:  N/A Code Status:  full code - patient has had both full code and DNR  code statuses in the last year, given his encephalopathy at present will keep patient full code until able to speak to family or patient mental status improves. Last date of multidisciplinary goals of care discussion [Pending - d/w patient at bedside 6/14AM but encephalopathic, left VM for family]  Labs:  CBC: Recent Labs  Lab 03/18/22 0910 03/19/22 0139 03/22/22 1425  WBC 3.6* 5.9 5.1  NEUTROABS  --   --  4.0  HGB 9.4* 9.3* 8.7*  HCT 29.9* 28.7* 28.3*  MCV 99.0 94.7 98.6  PLT 93* 121* 87*   Basic Metabolic Panel: Recent Labs  Lab 03/18/22 0910 03/19/22 0139 03/22/22 1425  NA 137 137 134*  K 4.1 5.5* 5.5*  CL 108 108 108  CO2 24 21*  22  GLUCOSE 105* 153* 162*  BUN 41* 39* 57*  CREATININE 1.69* 1.50* 4.78*  CALCIUM 8.5* 8.6* 8.6*   GFR: Estimated Creatinine Clearance: 12.5 mL/min (A) (by C-G formula based on SCr of 4.78 mg/dL (H)). Recent Labs  Lab 03/18/22 0910 03/19/22 0139 03/22/22 1425 03/22/22 1426 03/22/22 2022  WBC 3.6* 5.9 5.1  --   --   LATICACIDVEN  --   --   --  1.9 1.7   Liver Function Tests: Recent Labs  Lab 03/18/22 0910 03/19/22 0139 03/22/22 1425  AST 25 84* 545*  ALT 12 27 293*  ALKPHOS 103 115 393*  BILITOT 0.8 2.0* 1.8*  PROT 5.4* 4.5* 5.0*  ALBUMIN 2.7* 2.5* 2.5*   No results for input(s): "LIPASE", "AMYLASE" in the last 168 hours. Recent Labs  Lab 03/22/22 1427  AMMONIA 33   ABG:    Component Value Date/Time   PHART 7.393 12/04/2019 0326   PCO2ART 37.7 12/04/2019 0326   PO2ART 63.7 (L) 12/04/2019 0326   HCO3 22.5 12/04/2019 0326   TCO2 23 04/07/2011 0007   ACIDBASEDEF 1.7 12/04/2019 0326   O2SAT 91.1 12/04/2019 0326    Coagulation Profile: Recent Labs  Lab 03/18/22 0910 03/19/22 0139 03/22/22 1425  INR 1.2 1.6* 1.5*   Cardiac Enzymes: No results for input(s): "CKTOTAL", "CKMB", "CKMBINDEX", "TROPONINI" in the last 168 hours.  HbA1C: Hgb A1c MFr Bld  Date/Time Value Ref Range Status  11/03/2021 06:04 PM 5.4 4.8 - 5.6 % Final    Comment:    (NOTE)         Prediabetes: 5.7 - 6.4         Diabetes: >6.4         Glycemic control for adults with diabetes: <7.0   07/13/2021 03:18 AM 5.2 4.8 - 5.6 % Final    Comment:    (NOTE) Pre diabetes:          5.7%-6.4%  Diabetes:              >6.4%  Glycemic control for   <7.0% adults with diabetes    CBG: Recent Labs  Lab 03/18/22 1557 03/20/22 1234 03/22/22 1501 03/22/22 2351 03/23/22 0011  GLUCAP 92 157* 153* 170* 188*   Review of Systems:   Review of systems completed with pertinent positives/negatives outlined in above HPI.  Past Medical History:  He,  has a past medical history of Aortic  atherosclerosis ([place]), Benign prostatic hypertrophy, Blood transfusion without reported diagnosis, CHF (congestive heart failure) ([place]), COPD (chronic obstructive pulmonary disease) ([place]) (12/15/2009), Degenerative joint disease, Depression, Diverticulosis, Duodenal varices, Esophageal varices ([place]), Essential hypertension, Fatty liver, Gastric ulcer, Hepatic cirrhosis ([place]), Hepatocellular carcinoma ([place]), Hyperlipidemia, IDA (iron deficiency anemia), Internal hemorrhoids, Liver cancer (  [place]), Low back pain, Otitis externa (07/30/2012), Pneumonia, Portal hypertensive gastropathy ([place]), SBP (spontaneous bacterial peritonitis) ([place]), Sepsis ([place]), Splenomegaly, Thrombocytopenia ([place]), Type 2 diabetes mellitus ([place]), and Ulcer of foot ([place]).   Surgical History:   Past Surgical History:  Procedure Laterality Date   BIOPSY  07/14/2021   Procedure: BIOPSY;  Surgeon: Daryel November, MD;  Location: [facility] ENDOSCOPY;  Service: Gastroenterology;;  EGD and COLON   BIOPSY  02/03/2022   Procedure: BIOPSY;  Surgeon: Mauri Pole, MD;  Location: [facility] ENDOSCOPY;  Service: Gastroenterology;;   COLONOSCOPY  2020   JMP-MAC-plenvu (good)-polyps-recall 1 yr   COLONOSCOPY WITH PROPOFOL N/A 07/14/2021   Procedure: COLONOSCOPY WITH PROPOFOL;  Surgeon: Daryel November, MD;  Location: [place];  Service: Gastroenterology;  Laterality: N/A;   ESOPHAGOGASTRODUODENOSCOPY (EGD) WITH PROPOFOL N/A 07/14/2021   Procedure: ESOPHAGOGASTRODUODENOSCOPY (EGD) WITH PROPOFOL;  Surgeon: Daryel November, MD;  Location: [place];  Service: Gastroenterology;  Laterality: N/A;   ESOPHAGOGASTRODUODENOSCOPY (EGD) WITH PROPOFOL N/A 02/03/2022   Procedure: ESOPHAGOGASTRODUODENOSCOPY (EGD) WITH PROPOFOL;  Surgeon: Mauri Pole, MD;  Location: [facility] ENDOSCOPY;  Service: Gastroenterology;  Laterality: N/A;   EYE SURGERY     HEMOSTASIS CLIP PLACEMENT  02/03/2022   Procedure: HEMOSTASIS CLIP PLACEMENT;  Surgeon: Mauri Pole, MD;  Location: [facility] ENDOSCOPY;  Service: Gastroenterology;;   IR EMBO TUMOR ORGAN ISCHEMIA INFARCT INC GUIDE ROADMAPPING  06/01/2020   IR INTRAVASCULAR ULTRASOUND NON CORONARY  03/18/2022   IR PARACENTESIS  08/30/2021   IR PARACENTESIS  09/22/2021   IR PARACENTESIS  10/12/2021   IR PARACENTESIS  11/02/2021   IR PARACENTESIS  11/15/2021   IR PARACENTESIS  11/30/2021   IR PARACENTESIS  12/14/2021   IR PARACENTESIS  12/27/2021   IR PARACENTESIS  01/04/2022   IR PARACENTESIS  01/12/2022   IR PARACENTESIS  01/19/2022   IR PARACENTESIS  02/02/2022   IR PARACENTESIS  02/16/2022   IR PARACENTESIS  02/28/2022   IR PARACENTESIS  03/15/2022   IR PARACENTESIS  03/18/2022   IR RADIOLOGIST EVAL & MGMT  04/28/2020   IR RADIOLOGIST EVAL & MGMT  07/15/2020   IR RADIOLOGIST EVAL & MGMT  05/04/2021   IR TIPS  03/18/2022   IR US GUIDE VASC ACCESS RIGHT  03/18/2022   IR US GUIDE VASC ACCESS RIGHT  03/18/2022   RADIOLOGY WITH ANESTHESIA N/A 03/18/2022   Procedure: TIPS;  Surgeon: Suzette Battiest, MD;  Location: [place];  Service: Radiology;  Laterality: N/A;    Social History:   reports that he quit smoking about 37 years ago. His smoking use included cigarettes. He has a 80.00 pack-year smoking history. He has never used smokeless tobacco. He reports that he does not drink alcohol and does not use drugs.   Family History:  His family history includes CAD in his brother; Melanoma in his mother; Stroke in his father. There is no history of Colon polyps, Colon cancer, Esophageal cancer, Stomach cancer, or Rectal cancer.   Allergies: No Known Allergies   Home Medications: Prior to Admission medications   Medication Sig Start Date End Date Taking? Authorizing Provider  albuterol (VENTOLIN HFA) 108 (90 Base) MCG/ACT inhaler Inhale 2 puffs into the lungs every 6 (six) hours as needed for wheezing or shortness of breath. 11/01/21  Yes [name], MD  busPIRone (BUSPAR) 7.5 MG tablet Take 1 tablet (7.5  mg total) by mouth 2 (two) times daily as needed. for anxiety Patient taking differently: Take 7.5 mg by mouth  See admin instructions. Take 7.5 mg tablet by mouth at bedtime, may take a second 7.5 mg dose as needed for anxiety 05/14/21  Yes [name], MD  calcium carbonate (TUMS EX) 750 MG chewable tablet Chew 2 tablets by mouth daily as needed for heartburn.   Yes [provider]  carvedilol (COREG) 6.25 MG tablet Take 1 tablet (6.25 mg total) by mouth 2 (two) times daily with a meal. 02/01/22  Yes [name], MD  ciprofloxacin (CIPRO) 250 MG tablet Take 1 tablet (250 mg total) by mouth daily. 02/09/22  Yes Pyrtle, Lajuan Lines, MD  ENTRESTO 24-26 MG Take 1 tablet by mouth 2 (two) times daily. 01/26/22  Yes [provider]  escitalopram (LEXAPRO) 5 MG tablet Take 1 tablet (5 mg total) by mouth daily. 05/14/21  Yes [name], MD  finasteride (PROSCAR) 5 MG tablet TAKE 1 TABLET EVERY DAY Patient taking differently: Take 5 mg by mouth daily. 01/10/22  Yes [name], MD  furosemide (LASIX) 20 MG tablet Take 20 mg by mouth daily. 01/04/22  Yes [provider]  gabapentin (NEURONTIN) 300 MG capsule TAKE 1 CAPSULE TWICE DAILY Patient taking differently: Take 300 mg by mouth 2 (two) times daily. 03/15/21  Yes [name], MD  lactulose (CHRONULAC) 10 GM/15ML solution Take 30 mLs (20 g total) by mouth 3 (three) times daily. 03/19/22 04/18/22 Yes Han, Aimee H, PA-C  oxyCODONE-acetaminophen (PERCOCET) 10-325 MG tablet Take 1 tablet by mouth every 8 (eight) hours as needed for pain (chronic pain in cancer patient with cirrhosis). 03/10/22 2022-04-27 Yes [name], MD  pantoprazole (PROTONIX) 40 MG tablet Take 1 tablet (40 mg total) by mouth 2 (two) times daily. 02/01/22  Yes [name], MD  rifaximin (XIFAXAN) 550 MG TABS tablet Take 1 tablet (550 mg total) by mouth 2 (two) times daily. 02/14/22  Yes Pyrtle, Lajuan Lines, MD  rosuvastatin (CRESTOR) 5 MG tablet TAKE 1  TABLET EVERY DAY Patient taking differently: Take 5 mg by mouth daily. 11/16/21  Yes [name], MD  sucralfate (CARAFATE) 1 g tablet Take 1 tablet (1 g total) by mouth 4 (four) times daily. 02/03/22 02/03/23 Yes Nandigam, Venia Minks, MD  tamsulosin (FLOMAX) 0.4 MG CAPS capsule TAKE 1 CAPSULE EVERY DAY Patient taking differently: Take 0.4 mg by mouth daily. 03/14/22  Yes [name], MD    Critical care time: 50 minutes   Lestine Mount, [place] [facility] 03/23/22 12:37 AM  Please see [email or web address] for pager details.  From 7A-7P if no response, please call [phone] After hours, please call ELink [phone]

## 2022-03-22 NOTE — Telephone Encounter (Signed)
   [facility] TIPS post-procedure phone call follow-up   [name] is a 81 y.o. male who underwent TIPS creation at [facility] on 03/18/22 by Dr. Serafina Royals  Patient is 4 days from his procedure.   # of paracentesis in last month: 4 including one during TIPS # of paracentesis in last 2 months: 7  Current diuretic regimen: Lasix 20 mg daily  Current pharmacologic encephalopathy prophylaxis/treatment: Lactulose 20 grams TID  Post-TIPS Imaging: None   Labs: 03/19/22 Creatinine: 1.5 Total Bilirubin: 2.0 INR: 1.6 Sodium: 137 Albumin: 2.5 Ammonia: n/a  Assessment: DAYVION SANS is an 81 y.o. male with history of cirrhosis. Patient is 4 days from TIPS creation on 03/18/22 with discharge home 03/19/22. IR received notice today that the patient fell when he arrived home and injured his arm. He has had difficulty getting out of bed and has had a decreased appetite. I called the patient and spoke with his daughter [name]. [name] states that her dad had a fall when he arrived home and he injured his arm. She states that he has been mostly bed bound the past few days only standing briefly to urinate. He has had a markedly decreased appetite and has urinated very little in the past few days. According to [name] he has "urinated about a teaspoon in the past day." He has been taking his medications as directed - including his lactulose - but he is constipated. He is alert and oriented. Mr. Bamba is scheduled for a visit to his PCP today but [name] states he is refusing to go. She has wanted to call EMS to take him to the hospital but she said he refuses to go. Mr. Pautz was agreeable to speak with me and I expressed my concern for his current state of health. I told him I was concerned that he is unable to get out of bed and that he's not urinating much. I encouraged him to see his PCP today for evaluation and he said he didn't want to go. I encouraged him to go to the ED for  evaluation and he was agreeable to this.   Recommendation:  Patient is symptomatic and needs to be evaluated. His daughter [name] is going to take him to the [facility] ED for further evaluation.   Electronically Signed: Theresa Duty, NP 03/22/2022, 11:45 AM

## 2022-03-22 NOTE — ED Notes (Signed)
EDP made aware hypotension persists.

## 2022-03-22 NOTE — Telephone Encounter (Signed)
Noted  

## 2022-03-22 NOTE — ED Notes (Addendum)
Awaiting verification of placement of central line before using and going up on titration of levo.

## 2022-03-22 NOTE — ED Notes (Signed)
This nurse handed off this pt to [facility] with the levophed running higher than the ordered parameters due to hypotension. EDP made aware and he is placing a central line at this time.

## 2022-03-22 NOTE — ED Provider Notes (Signed)
.  Critical Care  Performed by: Wyvonnia Dusky, MD Authorized by: Wyvonnia Dusky, MD   Critical care provider statement:    Critical care time (minutes):  45   Critical care time was exclusive of:  Separately billable procedures and treating other patients   Critical care was necessary to treat or prevent imminent or life-threatening deterioration of the following conditions:  Sepsis   Critical care was time spent personally by me on the following activities:  Ordering and performing treatments and interventions, ordering and review of laboratory studies, ordering and review of radiographic studies, pulse oximetry, review of old charts, examination of patient and evaluation of patient's response to treatment .Central Line  Date/Time: 03/22/2022 7:34 PM  Performed by: Wyvonnia Dusky, MD Authorized by: Wyvonnia Dusky, MD   Consent:    Consent obtained:  Verbal   Consent given by:  Patient   Risks, benefits, and alternatives were discussed: yes     Risks discussed:  Incorrect placement, infection, nerve damage, pneumothorax, bleeding and arterial puncture   Alternatives discussed:  No treatment and observation Universal protocol:    Procedure explained and questions answered to patient or proxy's satisfaction: yes     Immediately prior to procedure, a time out was called: yes     Patient identity confirmed:  Arm band Pre-procedure details:    Indication(s): central venous access     Hand hygiene: Hand hygiene performed prior to insertion     Sterile barrier technique: All elements of maximal sterile technique followed     Skin preparation:  Chlorhexidine   Skin preparation agent: Skin preparation agent completely dried prior to procedure   Sedation:    Sedation type:  None Anesthesia:    Anesthesia method:  Local infiltration   Local anesthetic:  Lidocaine 1% w/o epi Procedure details:    Location:  R internal jugular   Patient position:  Supine   Procedural supplies:   Triple lumen   Landmarks identified: yes     Ultrasound guidance: yes     Ultrasound guidance timing: real time     Sterile ultrasound techniques: Sterile gel and sterile probe covers were used     Number of attempts:  1   Successful placement: yes   Post-procedure details:    Post-procedure:  Dressing applied and line sutured   Assessment:  Blood return through all ports and free fluid flow   Procedure completion:  Tolerated well, no immediate complications     Norissa Bartee, Carola Rhine, MD 03/22/22 1935

## 2022-03-23 ENCOUNTER — Inpatient Hospital Stay (HOSPITAL_COMMUNITY): Payer: Medicare HMO

## 2022-03-23 ENCOUNTER — Encounter (HOSPITAL_COMMUNITY): Payer: Self-pay | Admitting: Pulmonary Disease

## 2022-03-23 DIAGNOSIS — R012 Other cardiac sounds: Secondary | ICD-10-CM

## 2022-03-23 DIAGNOSIS — N179 Acute kidney failure, unspecified: Secondary | ICD-10-CM

## 2022-03-23 DIAGNOSIS — R579 Shock, unspecified: Secondary | ICD-10-CM

## 2022-03-23 DIAGNOSIS — N171 Acute kidney failure with acute cortical necrosis: Secondary | ICD-10-CM | POA: Diagnosis not present

## 2022-03-23 DIAGNOSIS — L8990 Pressure ulcer of unspecified site, unspecified stage: Secondary | ICD-10-CM | POA: Insufficient documentation

## 2022-03-23 LAB — CBC
HCT: 25.5 % — ABNORMAL LOW (ref 39.0–52.0)
HCT: 26.2 % — ABNORMAL LOW (ref 39.0–52.0)
Hemoglobin: 8.5 g/dL — ABNORMAL LOW (ref 13.0–17.0)
Hemoglobin: 8.6 g/dL — ABNORMAL LOW (ref 13.0–17.0)
MCH: 31.3 pg (ref 26.0–34.0)
MCH: 31.4 pg (ref 26.0–34.0)
MCHC: 32.8 g/dL (ref 30.0–36.0)
MCHC: 33.3 g/dL (ref 30.0–36.0)
MCV: 94.1 fL (ref 80.0–100.0)
MCV: 95.3 fL (ref 80.0–100.0)
Platelets: 121 10*3/uL — ABNORMAL LOW (ref 150–400)
Platelets: 88 10*3/uL — ABNORMAL LOW (ref 150–400)
RBC: 2.71 MIL/uL — ABNORMAL LOW (ref 4.22–5.81)
RBC: 2.75 MIL/uL — ABNORMAL LOW (ref 4.22–5.81)
RDW: 19.7 % — ABNORMAL HIGH (ref 11.5–15.5)
RDW: 19.9 % — ABNORMAL HIGH (ref 11.5–15.5)
WBC: 5.4 10*3/uL (ref 4.0–10.5)
WBC: 7.8 10*3/uL (ref 4.0–10.5)
nRBC: 0 % (ref 0.0–0.2)
nRBC: 0 % (ref 0.0–0.2)

## 2022-03-23 LAB — URINALYSIS, ROUTINE W REFLEX MICROSCOPIC
Bilirubin Urine: NEGATIVE
Glucose, UA: NEGATIVE mg/dL
Hgb urine dipstick: NEGATIVE
Ketones, ur: NEGATIVE mg/dL
Nitrite: NEGATIVE
Protein, ur: NEGATIVE mg/dL
Specific Gravity, Urine: 1.03 (ref 1.005–1.030)
pH: 5 (ref 5.0–8.0)

## 2022-03-23 LAB — GLUCOSE, CAPILLARY
Glucose-Capillary: 132 mg/dL — ABNORMAL HIGH (ref 70–99)
Glucose-Capillary: 137 mg/dL — ABNORMAL HIGH (ref 70–99)
Glucose-Capillary: 149 mg/dL — ABNORMAL HIGH (ref 70–99)
Glucose-Capillary: 153 mg/dL — ABNORMAL HIGH (ref 70–99)
Glucose-Capillary: 154 mg/dL — ABNORMAL HIGH (ref 70–99)
Glucose-Capillary: 188 mg/dL — ABNORMAL HIGH (ref 70–99)
Glucose-Capillary: 226 mg/dL — ABNORMAL HIGH (ref 70–99)

## 2022-03-23 LAB — BASIC METABOLIC PANEL
Anion gap: 8 (ref 5–15)
Anion gap: 9 (ref 5–15)
BUN: 56 mg/dL — ABNORMAL HIGH (ref 8–23)
BUN: 54 mg/dL — ABNORMAL HIGH (ref 8–23)
CO2: 20 mmol/L — ABNORMAL LOW (ref 22–32)
CO2: 20 mmol/L — ABNORMAL LOW (ref 22–32)
Calcium: 8.6 mg/dL — ABNORMAL LOW (ref 8.9–10.3)
Calcium: 8.5 mg/dL — ABNORMAL LOW (ref 8.9–10.3)
Chloride: 105 mmol/L (ref 98–111)
Chloride: 104 mmol/L (ref 98–111)
Creatinine, Ser: 4.25 mg/dL — ABNORMAL HIGH (ref 0.61–1.24)
Creatinine, Ser: 4.02 mg/dL — ABNORMAL HIGH (ref 0.61–1.24)
GFR, Estimated: 13 mL/min — ABNORMAL LOW (ref >60)
GFR, Estimated: 14 mL/min — ABNORMAL LOW (ref >60)
Glucose, Bld: 171 mg/dL — ABNORMAL HIGH (ref 70–99)
Glucose, Bld: 168 mg/dL — ABNORMAL HIGH (ref 70–99)
Potassium: 5.8 mmol/L — ABNORMAL HIGH (ref 3.5–5.1)
Potassium: 5.3 mmol/L — ABNORMAL HIGH (ref 3.5–5.1)
Sodium: 133 mmol/L — ABNORMAL LOW (ref 135–145)
Sodium: 133 mmol/L — ABNORMAL LOW (ref 135–145)

## 2022-03-23 LAB — ECHOCARDIOGRAM LIMITED
AR max vel: 1.38 cm2
AV Area VTI: 1.4 cm2
AV Area mean vel: 1.35 cm2
AV Mean grad: 17.3 mmHg
AV Peak grad: 29.8 mmHg
Ao pk vel: 2.73 m/s
Area-P 1/2: 3.56 cm2
Calc EF: 65.6 %
Height: 70 in
S' Lateral: 2.8 cm
Single Plane A2C EF: 67.4 %
Single Plane A4C EF: 64.1 %
Weight: 2977.09 oz

## 2022-03-23 LAB — COMPREHENSIVE METABOLIC PANEL
ALT: 242 U/L — ABNORMAL HIGH (ref 0–44)
ALT: 237 U/L — ABNORMAL HIGH (ref 0–44)
AST: 398 U/L — ABNORMAL HIGH (ref 15–41)
AST: 360 U/L — ABNORMAL HIGH (ref 15–41)
Albumin: 2.2 g/dL — ABNORMAL LOW (ref 3.5–5.0)
Albumin: 2.2 g/dL — ABNORMAL LOW (ref 3.5–5.0)
Alkaline Phosphatase: 348 U/L — ABNORMAL HIGH (ref 38–126)
Alkaline Phosphatase: 349 U/L — ABNORMAL HIGH (ref 38–126)
Anion gap: 4 — ABNORMAL LOW (ref 5–15)
Anion gap: 7 (ref 5–15)
BUN: 52 mg/dL — ABNORMAL HIGH (ref 8–23)
BUN: 53 mg/dL — ABNORMAL HIGH (ref 8–23)
CO2: 20 mmol/L — ABNORMAL LOW (ref 22–32)
CO2: 21 mmol/L — ABNORMAL LOW (ref 22–32)
Calcium: 8.2 mg/dL — ABNORMAL LOW (ref 8.9–10.3)
Calcium: 8.4 mg/dL — ABNORMAL LOW (ref 8.9–10.3)
Chloride: 109 mmol/L (ref 98–111)
Chloride: 105 mmol/L (ref 98–111)
Creatinine, Ser: 4.66 mg/dL — ABNORMAL HIGH (ref 0.61–1.24)
Creatinine, Ser: 4.53 mg/dL — ABNORMAL HIGH (ref 0.61–1.24)
GFR, Estimated: 12 mL/min — ABNORMAL LOW (ref >60)
GFR, Estimated: 12 mL/min — ABNORMAL LOW (ref >60)
Glucose, Bld: 194 mg/dL — ABNORMAL HIGH (ref 70–99)
Glucose, Bld: 179 mg/dL — ABNORMAL HIGH (ref 70–99)
Potassium: 5.7 mmol/L — ABNORMAL HIGH (ref 3.5–5.1)
Potassium: 5.6 mmol/L — ABNORMAL HIGH (ref 3.5–5.1)
Sodium: 133 mmol/L — ABNORMAL LOW (ref 135–145)
Sodium: 133 mmol/L — ABNORMAL LOW (ref 135–145)
Total Bilirubin: 2 mg/dL — ABNORMAL HIGH (ref 0.3–1.2)
Total Bilirubin: 1.9 mg/dL — ABNORMAL HIGH (ref 0.3–1.2)
Total Protein: 4.5 g/dL — ABNORMAL LOW (ref 6.5–8.1)
Total Protein: 4.5 g/dL — ABNORMAL LOW (ref 6.5–8.1)

## 2022-03-23 LAB — PHOSPHORUS
Phosphorus: 4.5 mg/dL (ref 2.5–4.6)
Phosphorus: 4.5 mg/dL (ref 2.5–4.6)

## 2022-03-23 LAB — TYPE AND SCREEN
ABO/RH(D): A NEG
Antibody Screen: NEGATIVE

## 2022-03-23 LAB — CORTISOL: Cortisol, Plasma: 13.1 ug/dL

## 2022-03-23 LAB — MRSA NEXT GEN BY PCR, NASAL: MRSA by PCR Next Gen: NOT DETECTED

## 2022-03-23 LAB — AMMONIA: Ammonia: 52 umol/L — ABNORMAL HIGH (ref 9–35)

## 2022-03-23 LAB — MAGNESIUM
Magnesium: 2 mg/dL (ref 1.7–2.4)
Magnesium: 2.1 mg/dL (ref 1.7–2.4)

## 2022-03-23 LAB — PROCALCITONIN: Procalcitonin: 0.56 ng/mL

## 2022-03-23 LAB — PROTIME-INR
INR: 1.6 — ABNORMAL HIGH (ref 0.8–1.2)
INR: 1.5 — ABNORMAL HIGH (ref 0.8–1.2)
Prothrombin Time: 18.8 seconds — ABNORMAL HIGH (ref 11.4–15.2)
Prothrombin Time: 17.8 seconds — ABNORMAL HIGH (ref 11.4–15.2)

## 2022-03-23 LAB — BRAIN NATRIURETIC PEPTIDE: B Natriuretic Peptide: 697.8 pg/mL — ABNORMAL HIGH (ref 0.0–100.0)

## 2022-03-23 LAB — CREATININE, URINE, RANDOM: Creatinine, Urine: 278.67 mg/dL

## 2022-03-23 LAB — SODIUM, URINE, RANDOM: Sodium, Ur: <10 mmol/L

## 2022-03-23 LAB — CK: Total CK: 18 U/L — ABNORMAL LOW (ref 49–397)

## 2022-03-23 MED ORDER — INSULIN ASPART 100 UNIT/ML IV SOLN
5.0000 [IU] | Freq: Once | INTRAVENOUS | Status: AC
  Administered 2022-03-23: 5 [IU] via INTRAVENOUS

## 2022-03-23 MED ORDER — ROSUVASTATIN CALCIUM 5 MG PO TABS
5.0000 mg | ORAL_TABLET | Freq: Every day | ORAL | Status: DC
Start: 2022-03-23 — End: 2022-03-24
  Administered 2022-03-23 – 2022-03-24 (×2): 5 mg via ORAL
  Filled 2022-03-23 (×2): qty 1

## 2022-03-23 MED ORDER — ALBUMIN HUMAN 25 % IV SOLN
75.0000 g | Freq: Once | INTRAVENOUS | Status: AC
  Administered 2022-03-23: 75 g via INTRAVENOUS
  Filled 2022-03-23 (×2): qty 300

## 2022-03-23 MED ORDER — LIDOCAINE HCL (PF) 1 % IJ SOLN
INTRAMUSCULAR | Status: AC
  Administered 2022-03-23: 2 mL via INTRADERMAL
  Filled 2022-03-23: qty 5

## 2022-03-23 MED ORDER — RIFAXIMIN 550 MG PO TABS
550.0000 mg | ORAL_TABLET | Freq: Two times a day (BID) | ORAL | Status: DC
  Administered 2022-03-23 – 2022-03-24 (×3): 550 mg via ORAL
  Filled 2022-03-23 (×3): qty 1

## 2022-03-23 MED ORDER — LACTULOSE ENEMA
300.0000 mL | Freq: Three times a day (TID) | RECTAL | Status: DC
Start: 2022-03-23 — End: 2022-03-24
  Filled 2022-03-23: qty 300

## 2022-03-23 MED ORDER — LIDOCAINE HCL (PF) 1 % IJ SOLN: 2.0000 mL | Freq: Once | INTRAMUSCULAR | Status: AC

## 2022-03-23 MED ORDER — ALBUTEROL SULFATE (2.5 MG/3ML) 0.083% IN NEBU
2.5000 mg | INHALATION_SOLUTION | RESPIRATORY_TRACT | Status: DC | PRN
  Administered 2022-04-03 – 2022-04-04 (×3): 2.5 mg via RESPIRATORY_TRACT
  Filled 2022-03-23 (×3): qty 3

## 2022-03-23 MED ORDER — TAMSULOSIN HCL 0.4 MG PO CAPS
0.4000 mg | ORAL_CAPSULE | Freq: Every day | ORAL | Status: DC
  Administered 2022-03-23 – 2022-03-24 (×2): 0.4 mg via ORAL
  Filled 2022-03-23 (×2): qty 1

## 2022-03-23 MED ORDER — COSYNTROPIN 0.25 MG IJ SOLR
0.2500 mg | Freq: Once | INTRAMUSCULAR | Status: AC
  Administered 2022-03-24: 0.25 mg via INTRAVENOUS
  Filled 2022-03-23: qty 0.25

## 2022-03-23 MED ORDER — ORAL CARE MOUTH RINSE
15.0000 mL | Freq: Two times a day (BID) | OROMUCOSAL | Status: DC
  Administered 2022-03-23 – 2022-03-29 (×13): 15 mL via OROMUCOSAL

## 2022-03-23 MED ORDER — SODIUM POLYSTYRENE SULFONATE 15 GM/60ML PO SUSP: 30.0000 g | Freq: Once | ORAL | Status: DC

## 2022-03-23 MED ORDER — SODIUM ZIRCONIUM CYCLOSILICATE 10 G PO PACK: 10.0000 g | PACK | Freq: Once | ORAL | Status: DC

## 2022-03-23 MED ORDER — SODIUM BICARBONATE 8.4 % IV SOLN
50.0000 meq | Freq: Once | INTRAVENOUS | Status: AC
Start: 2022-03-23 — End: 2022-03-23
  Administered 2022-03-23: 50 meq via INTRAVENOUS
  Filled 2022-03-23: qty 50

## 2022-03-23 MED ORDER — METHYLPREDNISOLONE SODIUM SUCC 40 MG IJ SOLR
40.0000 mg | Freq: Two times a day (BID) | INTRAMUSCULAR | Status: DC
Start: 2022-03-23 — End: 2022-03-23

## 2022-03-23 MED ORDER — ESCITALOPRAM OXALATE 10 MG PO TABS
5.0000 mg | ORAL_TABLET | Freq: Every day | ORAL | Status: DC
  Administered 2022-03-23 – 2022-03-24 (×2): 5 mg via ORAL
  Filled 2022-03-23 (×2): qty 1

## 2022-03-23 MED ORDER — VASOPRESSIN 20 UNITS/100 ML INFUSION FOR SHOCK
0.0000 [IU]/min | INTRAVENOUS | Status: DC
  Administered 2022-03-23 (×2): 0.03 [IU]/min via INTRAVENOUS
  Filled 2022-03-23 (×2): qty 100

## 2022-03-23 MED ORDER — BUSPIRONE HCL 15 MG PO TABS: 7.5000 mg | ORAL_TABLET | Freq: Two times a day (BID) | ORAL | Status: DC | PRN

## 2022-03-23 MED ORDER — SODIUM ZIRCONIUM CYCLOSILICATE 10 G PO PACK
10.0000 g | PACK | Freq: Once | ORAL | Status: AC
Start: 2022-03-23 — End: 2022-03-23
  Administered 2022-03-23: 10 g via ORAL
  Filled 2022-03-23: qty 1

## 2022-03-23 MED ORDER — LACTULOSE 10 GM/15ML PO SOLN
20.0000 g | Freq: Three times a day (TID) | ORAL | Status: DC
  Administered 2022-03-23 – 2022-03-24 (×4): 20 g via ORAL
  Filled 2022-03-23 (×4): qty 30

## 2022-03-23 MED ORDER — PANTOPRAZOLE SODIUM 40 MG IV SOLR
40.0000 mg | Freq: Once | INTRAVENOUS | Status: AC
  Administered 2022-03-23: 40 mg via INTRAVENOUS
  Filled 2022-03-23: qty 10

## 2022-03-23 MED ORDER — VASOPRESSIN 20 UNITS/100 ML INFUSION FOR SHOCK
INTRAVENOUS | Status: AC
  Filled 2022-03-23: qty 100

## 2022-03-23 MED ORDER — NOREPINEPHRINE 16 MG/250ML-% IV SOLN
0.0000 ug/min | INTRAVENOUS | Status: DC
  Administered 2022-03-23: 18 ug/min via INTRAVENOUS
  Administered 2022-03-23: 20 ug/min via INTRAVENOUS
  Filled 2022-03-23 (×2): qty 250

## 2022-03-23 MED ORDER — SODIUM CHLORIDE 0.9 % IV SOLN: INTRAVENOUS | Status: DC

## 2022-03-23 MED ORDER — INSULIN ASPART 100 UNIT/ML IJ SOLN
0.0000 [IU] | INTRAMUSCULAR | Status: DC
  Administered 2022-03-23: 2 [IU] via SUBCUTANEOUS
  Administered 2022-03-23: 1 [IU] via SUBCUTANEOUS
  Administered 2022-03-23: 3 [IU] via SUBCUTANEOUS
  Administered 2022-03-23: 2 [IU] via SUBCUTANEOUS
  Administered 2022-03-23 (×2): 1 [IU] via SUBCUTANEOUS
  Administered 2022-03-23: 2 [IU] via SUBCUTANEOUS
  Administered 2022-03-24 – 2022-03-25 (×5): 1 [IU] via SUBCUTANEOUS
  Administered 2022-03-26: 3 [IU] via SUBCUTANEOUS
  Administered 2022-03-26 – 2022-03-27 (×6): 2 [IU] via SUBCUTANEOUS
  Administered 2022-03-27 (×3): 3 [IU] via SUBCUTANEOUS
  Administered 2022-03-28 (×2): 2 [IU] via SUBCUTANEOUS
  Administered 2022-03-28 (×4): 3 [IU] via SUBCUTANEOUS
  Administered 2022-03-29 (×2): 2 [IU] via SUBCUTANEOUS
  Administered 2022-03-29: 1 [IU] via SUBCUTANEOUS
  Administered 2022-03-29: 3 [IU] via SUBCUTANEOUS
  Administered 2022-03-29: 2 [IU] via SUBCUTANEOUS
  Administered 2022-03-29: 3 [IU] via SUBCUTANEOUS
  Administered 2022-03-29 – 2022-03-31 (×7): 2 [IU] via SUBCUTANEOUS
  Administered 2022-03-31: 1 [IU] via SUBCUTANEOUS
  Administered 2022-03-31: 2 [IU] via SUBCUTANEOUS
  Administered 2022-03-31 (×4): 1 [IU] via SUBCUTANEOUS
  Administered 2022-04-01 (×2): 2 [IU] via SUBCUTANEOUS
  Administered 2022-04-01: 3 [IU] via SUBCUTANEOUS
  Administered 2022-04-01 – 2022-04-02 (×3): 2 [IU] via SUBCUTANEOUS
  Administered 2022-04-02: 1 [IU] via SUBCUTANEOUS
  Administered 2022-04-03 (×3): 2 [IU] via SUBCUTANEOUS

## 2022-03-23 MED ORDER — ONDANSETRON HCL 4 MG/2ML IJ SOLN
4.0000 mg | Freq: Four times a day (QID) | INTRAMUSCULAR | Status: DC | PRN
Start: 2022-03-23 — End: 2022-04-04
  Administered 2022-03-23: 4 mg via INTRAVENOUS
  Filled 2022-03-23: qty 2

## 2022-03-23 MED ORDER — SODIUM ZIRCONIUM CYCLOSILICATE 10 G PO PACK
10.0000 g | PACK | Freq: Once | ORAL | Status: AC
  Administered 2022-03-23: 10 g via ORAL
  Filled 2022-03-23: qty 1

## 2022-03-23 MED ORDER — DEXTROSE 50 % IV SOLN
1.0000 | Freq: Once | INTRAVENOUS | Status: AC
  Administered 2022-03-23: 50 mL via INTRAVENOUS
  Filled 2022-03-23: qty 50

## 2022-03-23 MED ORDER — LACTULOSE 10 GM/15ML PO SOLN: 20.0000 g | Freq: Three times a day (TID) | ORAL | Status: DC

## 2022-03-23 NOTE — Progress Notes (Addendum)
Attempted to call patient's daughter, Kenzel Ruesch at [phone] x 2. Voicemail left with brief update, asked to call back as able for update and to confirm/discuss patient wishes re: code status as he appears encephalopathic at present.  Lestine Mount, PA-C [place] 03/23/22 12:31 AM  Please see [email or web address] for pager details.  From 7A-7P if no response, please call [phone] After hours, please call ELink [phone]

## 2022-03-23 NOTE — Progress Notes (Signed)
Supervising Physician: Aletta Edouard  Patient Status:  [facility] - In-pt  Chief Complaint: Shock with multi-organ failure including elevated transaminitis and AKI. Recent TIPS 03/18/22.   Subjective: Patient in bed with eyes closed but he responded to voice/touch. He is conversant but confused. Unable to really answer any questions appropriately. He is ill-appearing but not in acute distress or discomfort. He denies pain.   Allergies: Patient has no known allergies.  Medications: Prior to Admission medications   Medication Sig Start Date End Date Taking? Authorizing Provider  albuterol (VENTOLIN HFA) 108 (90 Base) MCG/ACT inhaler Inhale 2 puffs into the lungs every 6 (six) hours as needed for wheezing or shortness of breath. 11/01/21  Yes [name], MD  busPIRone (BUSPAR) 5 MG tablet Take 5 mg by mouth 2 (two) times daily as needed (anxiety).   Yes [provider]  busPIRone (BUSPAR) 7.5 MG tablet Take 1 tablet (7.5 mg total) by mouth 2 (two) times daily as needed. for anxiety Patient taking differently: Take 7.5 mg by mouth 2 (two) times daily as needed (anxiety). 05/14/21  Yes [name], MD  carvedilol (COREG) 6.25 MG tablet Take 1 tablet (6.25 mg total) by mouth 2 (two) times daily with a meal. 02/01/22  Yes [name], MD  ciprofloxacin (CIPRO) 250 MG tablet Take 1 tablet (250 mg total) by mouth daily. Patient taking differently: Take 250 mg by mouth daily. Continuous course. 02/09/22  Yes Pyrtle, Lajuan Lines, MD  ENTRESTO 24-26 MG Take 1 tablet by mouth 2 (two) times daily. 01/26/22  Yes [provider]  escitalopram (LEXAPRO) 5 MG tablet Take 1 tablet (5 mg total) by mouth daily. 05/14/21  Yes [name], MD  finasteride (PROSCAR) 5 MG tablet TAKE 1 TABLET EVERY DAY Patient taking differently: Take 5 mg by mouth daily. 01/10/22  Yes [name], MD  furosemide (LASIX) 20 MG tablet Take 20 mg by mouth daily. 01/04/22  Yes [provider]   gabapentin (NEURONTIN) 300 MG capsule TAKE 1 CAPSULE TWICE DAILY Patient taking differently: Take 300 mg by mouth 2 (two) times daily as needed (pain). 03/15/21  Yes [name], MD  lactulose (CHRONULAC) 10 GM/15ML solution Take 30 mLs (20 g total) by mouth 3 (three) times daily. 03/19/22 04/18/22 Yes Han, Aimee H, PA-C  metFORMIN (GLUCOPHAGE) 500 MG tablet Take 500 mg by mouth 3 (three) times daily.   Yes [provider]  oxyCODONE (ROXICODONE) 5 MG immediate release tablet Take 2.5 mg by mouth every 6 (six) hours as needed for severe pain.   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 03/10/22 May 06, 2022 Yes [name], MD  pantoprazole (PROTONIX) 40 MG tablet Take 1 tablet (40 mg total) by mouth 2 (two) times daily. 02/01/22  Yes [name], MD  rifaximin (XIFAXAN) 550 MG TABS tablet Take 1 tablet (550 mg total) by mouth 2 (two) times daily. 02/14/22  Yes Pyrtle, Lajuan Lines, MD  rosuvastatin (CRESTOR) 10 MG tablet Take 10 mg by mouth daily.   Yes [provider]  spironolactone (ALDACTONE) 50 MG tablet Take 50 mg by mouth daily.   Yes [provider]  sucralfate (CARAFATE) 1 g tablet Take 1 tablet (1 g total) by mouth 4 (four) times daily. Patient taking differently: Take 1 g by mouth 2 (two) times daily.  02/03/22 02/03/23 Yes Nandigam, Venia Minks, MD  tamsulosin (FLOMAX) 0.4 MG CAPS capsule TAKE 1 CAPSULE EVERY DAY Patient taking differently: Take 0.4 mg by mouth daily. 03/14/22  Yes [name], MD  rosuvastatin (CRESTOR) 5 MG tablet TAKE 1 TABLET EVERY DAY Patient not taking: Reported on 03/23/2022 11/16/21   [name], MD     Vital Signs: BP (!) 102/53   Pulse 79   Temp 97.7 F (36.5 C) (Axillary)   Resp 11   Ht '5\' 10"'$  (1.778 m)   Wt 186 lb 1.1 oz (84.4 kg)   SpO2 97%   BMI 26.70 kg/m    Physical Exam Constitutional:      General: He is not in acute distress.    Appearance: He is ill-appearing.  HENT:     Mouth/Throat:     Mouth: Mucous membranes are dry.     Pharynx: Oropharynx is clear.  Cardiovascular:     Rate and Rhythm: Normal rate and regular rhythm.     Comments: Cardiac monitor. RIJ central line.  Pulmonary:     Effort: Pulmonary effort is normal.  Abdominal:     Palpations: Abdomen is soft.     Tenderness: There is no abdominal tenderness.  Musculoskeletal:     Right lower leg: No edema.     Left lower leg: No edema.  Skin:    General: Skin is warm and dry.     Coloration: Skin is not jaundiced.  Neurological:     Mental Status: He is alert. He is disoriented.     Imaging: US Abdomen Complete  Result Date: 03/23/2022 CLINICAL DATA:  Acute encephalopathy. EXAM: ABDOMEN ULTRASOUND COMPLETE COMPARISON:  CT of the abdomen pelvis dated 02/04/2022. FINDINGS: Evaluation is very limited due to portable technique. Gallbladder: Not visualized. Common bile duct: Diameter: 13 mm. There is dilatation of the common bile duct. Liver: Fatty liver with changes of cirrhosis. Portal vein is patent on color Doppler imaging with normal direction of blood flow towards the liver. IVC: Suboptimally visualized. Pancreas: Not well seen. Spleen: Size and appearance within normal limits. Right Kidney: Length: 10.7 cm. Mild increased echogenicity. No hydronephrosis or shadowing stone. Left Kidney: Length: 10.2 cm. Mild increased echogenicity. No hydronephrosis or shadowing stone Abdominal aorta: No aneurysm visualized. Other findings: Small ascites. Small bilateral pleural effusions noted. IMPRESSION: 1. Cirrhosis and small ascites. 2. Patent main portal vein with hepatopetal flow. 3. Small ascites and bilateral pleural effusions. Electronically Signed   By: Anner Crete M.D.   On: 03/23/2022 03:44   DG Chest Port 1V same Day  Result Date: 03/22/2022 CLINICAL DATA:  Central  line placement. EXAM: PORTABLE CHEST 1 VIEW COMPARISON:  Chest radiograph dated 03/22/2022. FINDINGS: Interval placement of a right IJ central venous line with tip projecting over the right atrium close to the cavoatrial junction. There is shallow inspiration. Probable small left pleural effusion and left lung base atelectasis. Pneumonia is not excluded. No pneumothorax. There is cardiomegaly mild vascular congestion. Atherosclerotic calcification of the aorta. Osteopenia with degenerative changes of the spine. No acute osseous pathology. IMPRESSION: Interval placement of a right IJ central venous line with tip projecting over the right atrium close to the cavoatrial junction. No pneumothorax. Electronically Signed   By: Anner Crete M.D.   On: 03/22/2022 19:48   DG Chest Portable 1 View  Result Date: 03/22/2022 CLINICAL DATA:  Generalized weakness. History of congestive heart failure. EXAM: PORTABLE CHEST 1 VIEW COMPARISON:  11/02/2021 FINDINGS: Heart size  upper limits of normal. Chronic aortic atherosclerosis. Probable small effusions, at least on the left. Minimal basilar atelectasis on the left. Upper lungs are clear. No widespread pulmonary edema. IMPRESSION: Aortic atherosclerosis. Probable pleural effusion, at least on the left, with mild left base volume loss. Electronically Signed   By: Nelson Chimes M.D.   On: 03/22/2022 14:47   CT Cervical Spine Wo Contrast  Result Date: 03/22/2022 CLINICAL DATA:  Neck pain extending into the left arm after falling 3 days ago. EXAM: CT CERVICAL SPINE WITHOUT CONTRAST TECHNIQUE: Multidetector CT imaging of the cervical spine was performed without intravenous contrast. Multiplanar CT image reconstructions were also generated. RADIATION DOSE REDUCTION: This exam was performed according to the departmental dose-optimization program which includes automated exposure control, adjustment of the mA and/or kV according to patient size and/or use of iterative  reconstruction technique. COMPARISON:  None Available. FINDINGS: Despite efforts by the technologist and patient, mild motion artifact is present on today's exam and could not be eliminated. This reduces exam sensitivity and specificity. Alignment: 6 mm of anterolisthesis at C4-5.  Otherwise anatomic. Skull base and vertebrae: No evidence of acute fracture or traumatic subluxation. There is multilevel spondylosis with disc space narrowing, uncinate spurring and facet hypertrophy. Facet hypertrophy at C4-5 is felt to account for the anterolisthesis noted above. Soft tissues and spinal canal: No prevertebral fluid or swelling. No visible canal hematoma. Bilateral carotid atherosclerosis. Disc levels: Multilevel spondylosis with disc space narrowing, uncinate spurring and bilateral facet hypertrophy. At C3-4, there is moderate foraminal narrowing, worse on the left. At C4-5, there is mild to moderate spinal stenosis and severe foraminal narrowing bilaterally due to the anterolisthesis. Severe foraminal narrowing is also present bilaterally at C5-6 due to spondylosis. Asymmetric severe left foraminal narrowing at C6-7 due to spondylosis. No acute disc space findings identified. Upper chest: Unremarkable. Other: None. IMPRESSION: 1. No evidence of acute cervical spine fracture, traumatic subluxation or static signs of instability. Detail limited by motion artifact. 2. 6 mm of anterolisthesis due to facet disease at C4-5. 3. Multilevel spondylosis with resulting osseous foraminal narrowing as detailed above. Electronically Signed   By: Richardean Sale M.D.   On: 03/22/2022 13:36   DG Shoulder Left  Result Date: 03/22/2022 CLINICAL DATA:  Fall EXAM: LEFT SHOULDER - 2+ VIEW COMPARISON:  None Available. FINDINGS: There is no evidence of fracture or dislocation. There is no evidence of arthropathy or other focal bone abnormality. Soft tissues are unremarkable. IMPRESSION: Negative. Electronically Signed   By: Macy Mis M.D.   On: 03/22/2022 13:28   DG Elbow Complete Left  Result Date: 03/22/2022 CLINICAL DATA:  Trauma, fall, pain EXAM: LEFT ELBOW - COMPLETE 3+ VIEW COMPARISON:  None Available. FINDINGS: No recent fracture or dislocation is seen. There is no significant displacement of posterior fat pad. Small bony spurs are noted. There are scattered vascular calcifications in the soft tissues. IMPRESSION: No recent fracture or dislocation is seen in the left elbow. Electronically Signed   By: Elmer Picker M.D.   On: 03/22/2022 13:22    Labs:  CBC: Recent Labs    03/19/22 0139 03/22/22 1425 03/23/22 0002 03/23/22 0417  WBC 5.9 5.1 7.8 5.4  HGB 9.3* 8.7* 8.6* 8.5*  HCT 28.7* 28.3* 26.2* 25.5*  PLT 121* 87* 121* 88*    COAGS: Recent Labs    03/18/22 0910 03/19/22 0139 03/22/22 1425 03/23/22 0002 03/23/22 0417  INR 1.2 1.6* 1.5* 1.6* 1.5*  APTT 30  --  40*  --   --  BMP: Recent Labs    03/23/22 0002 03/23/22 0417 03/23/22 1009 03/23/22 1545  NA 133* 133* 133* 133*  K 5.7* 5.6* 5.8* 5.3*  CL 109 105 105 104  CO2 20* 21* 20* 20*  GLUCOSE 194* 179* 171* 168*  BUN 52* 53* 56* 54*  CALCIUM 8.2* 8.4* 8.6* 8.5*  CREATININE 4.66* 4.53* 4.25* 4.02*  GFRNONAA 12* 12* 13* 14*    LIVER FUNCTION TESTS: Recent Labs    03/19/22 0139 03/22/22 1425 03/23/22 0002 03/23/22 0417  BILITOT 2.0* 1.8* 2.0* 1.9*  AST 84* 545* 398* 360*  ALT 27 293* 242* 237*  ALKPHOS 115 393* 348* 349*  PROT 4.5* 5.0* 4.5* 4.5*  ALBUMIN 2.5* 2.5* 2.2* 2.2*    Assessment and Plan:  Shock with multi-organ failure including elevated transaminitis and AKI. Recent TIPS 03/18/22.   Bedside paracentesis by the critical care team yielded 20 ml of fluid - labs pending. Echocardiogram done today with results pending. IR recommended hepatic duplex US which has been ordered.   Patient remains on pressors and ICU team has been able to titrate down on levophed throughout the day. Patient currently  receiving albumin infusion.   IR will continue to follow.   Electronically Signed: Soyla Dryer, AGACNP-BC [phone] 03/23/2022, 4:29 PM   I spent a total of 15 Minutes at the the patient's bedside AND on the patient's hospital floor or unit, greater than 50% of which was counseling/coordinating care for shock s/p TIPS.

## 2022-03-23 NOTE — Progress Notes (Signed)
Pt arrived from [facility] with 1 patient belonging bag containing 1 blue shirt, 1 pair of pj pants, and 1 pair of brown slippers. 1 black hat and 1 pair of white socks also placed in patient belonging bag. Bags placed at bedside.

## 2022-03-23 NOTE — Procedures (Signed)
Paracentesis Procedure Note  [name]  [id number]  24-Feb-1941  Date:03/23/22  Time:2:55 PM   Provider Performing:Meilani Edmundson W [place]    Procedure: Paracentesis with imaging guidance [phone])  Indication(s) Ascites  Consent Risks of the procedure as well as the alternatives and risks of each were explained to the patient and/or caregiver.  Consent for the procedure was obtained and is signed in the bedside chart  Anesthesia Topical only with 1% lidocaine    Time Out Verified patient identification, verified procedure, site/side was marked, verified correct patient position, special equipment/implants available, medications/allergies/relevant history reviewed, required imaging and test results available.   Sterile Technique Maximal sterile technique including full sterile barrier drape, hand hygiene, sterile gown, sterile gloves, mask, hair covering, sterile ultrasound probe cover (if used).   Procedure Description Ultrasound used to identify appropriate peritoneal anatomy for placement and overlying skin marked.  Area of drainage cleaned and draped in sterile fashion. Lidocaine was used to anesthetize the skin and subcutaneous tissue.  20 cc's of clear straw colored appearing fluid was drained. Catheter then removed and bandaid applied to site.   Complications/Tolerance None; patient tolerated the procedure well.   EBL Minimal   Specimen(s) Peritoneal fluid   Georgann Housekeeper, AGACNP-BC [facility]  See Amion for personal pager PCCM on call pager [phone] until 7pm. Please call Elink 7p-7a. [phone]  03/23/2022 2:55 PM

## 2022-03-23 NOTE — Progress Notes (Signed)
[facility] Progress Note Patient Name: [name] DOB: 25-Jul-1941 MRN: [id number]   Date of Service  03/23/2022  HPI/Events of Note  Patient with agitated delirium, and trying to climb out of bed, he is at high risk for falling. He has liver failure which limits pharmacologic options for managing his delirium.  eICU Interventions  Waist belt ordered.        Kerry Kass Dam Ashraf 03/23/2022, 11:03 PM

## 2022-03-23 NOTE — Progress Notes (Signed)
[facility] Progress Note Patient Name: [name] DOB: 1941-07-15 MRN: [id number]   Date of Service  03/23/2022  HPI/Events of Note  Nursing reports emesis - QTc interval = 0.462.  eICU Interventions  Plan: Zofran 4 mg IV Q 6 hours PRN N/V.     Intervention Category Major Interventions: Other:  Lysle Dingwall 03/23/2022, 6:35 AM

## 2022-03-23 NOTE — Progress Notes (Addendum)
[facility] Progress Note Patient Name: [name] DOB: [birth year] MRN: [id number]   Date of Service  03/23/2022  HPI/Events of Note  Hyperkalemia - K+ = 5.7. T wave not peaked on bedside cardiac monitor. Nursing states that patient passed swallowing screening.  eICU Interventions  Plan: Lokelma 10 gm PO X 1 now.  Repeat BMP at 10 AM.     Intervention Category Major Interventions: Electrolyte abnormality - evaluation and management  Wynelle Dreier Eugene 03/23/2022, 4:00 AM

## 2022-03-23 NOTE — Progress Notes (Signed)
[facility] Progress Note Patient Name: [name] DOB: 1941-02-01 MRN: [id number]   Date of Service  03/23/2022  HPI/Events of Note  Nursing reports confusion increased from admission. Ammonia level 03/22/2022 at 2:27 PM = 33.  eICU Interventions  Plan: Repeat Ammonia level at 5 AM.     Intervention Category Major Interventions: Change in mental status - evaluation and management  Keyairra Kolinski Eugene 03/23/2022, 3:49 AM

## 2022-03-23 NOTE — Progress Notes (Signed)
NAME:  [name], MRN:  [id number], DOB:  Nov 18, 1940, LOS: 1 ADMISSION DATE:  03/22/2022 CONSULTATION DATE:  03/23/2022 REFERRING MD:  Langston Masker - EDP ([facility]) CHIEF COMPLAINT:  Hypotension, FTT  History of Present Illness:  81 year old man who presented to [facility] 6/13 for weakness, decreased mobility and poor PO intake s/p fall 3 days PTA. PMHx significant for HTN, HLD, mild AS, HFrEF (EF as low as 20-25%, recovered with Echo 02/2022 EF 55-60%, G1DD), COPD, prior tobacco use, T2DM, NASH cirrhosis with portal hypertension as evidenced by EV, PHG, ascites requiring frequent paracentesis (c/b SBP) and HCC (s/p radiation), gastric/duodenal ulcers and diverticulosis, IDA. Recently underwent TIPS placement with IR 6/9 with gradient reduction from 13 to 2. Monitored x 24H and discharged home 6/10.  Patient reportedly had a fall 6/10 shortly after arriving home, sustaining a L arm injury. Since injury, patient's mobility has been decreased and appetite has been poor; additionally reporting decreased UOP and constipation. Patient was advised by IR to present to ED for further evaluation. Presented to [facility] ED 6/13 for weakness, decreased mobility and poor PO intake s/p fall. Vitals on ED arrival were notable for BP 89/35. Labs notable for new transaminitis with AST 545/ALT 293, Cr 4.78 (baseline 1.3-1.8), INR 1.5, WBC/LA WNL. Hgb 8.7, near baseline. CXR with small L pleural effusion. CT C-spine and L arm/shoulder imaging negative. Fluid resuscitation was initiated (3L) with slight improvement in SBPs to 80s. RIJ CVC placed and Levophed was initiated for additional BP support.  PCCM consulted for ICU admission/transfer to [facility].  Pertinent Medical History:   Past Medical History:  Diagnosis Date   Aortic atherosclerosis ([place])    Benign prostatic hypertrophy    Blood transfusion without reported diagnosis    CHF (congestive heart failure) (HCC)    a. EF 20-25% by echo in 11/2019   COPD (chronic obstructive  pulmonary disease) ([place]) 12/15/2009   FEV1 2.30 (70%) ratio 63 no better with B2 and DLCO 18/6 (73%) corrects to 106%   Degenerative joint disease    Depression    Diverticulosis    Duodenal varices    Esophageal varices (HCC)    Essential hypertension    ACE inhibitor cough   Fatty liver    Gastric ulcer    Hepatic cirrhosis (HCC)    Hepatocellular carcinoma (HCC)    Hyperlipidemia    IDA (iron deficiency anemia)    Internal hemorrhoids    Liver cancer ([place])    Low back pain    Otitis externa 07/30/2012   Pneumonia    Portal hypertensive gastropathy (HCC)    SBP (spontaneous bacterial peritonitis) ([place])    Sepsis (HCC)    Splenomegaly    Thrombocytopenia (HCC)    Type 2 diabetes mellitus ([place])    no meds   Ulcer of foot ([place])    Right foot   Significant Hospital Events: Including procedures, antibiotic start and stop dates in addition to other pertinent events   6/9 - TIPS placement at [facility] with IR, tolerated procedure well. Gradient reduced from 13 to 2. 6/10 - Discharged home. Sustained fall at home and L arm injury (imaging negative for fx/dislocation). 6/13 - Presented to [facility] for weakness, poor PO intake, FTT post-fall. Decreased UOP/constipation. Lab findings notable for new transaminitis, acute renal failure. Hypotensive requiring fluid resuscitation and pressor initiation. Transfer to [facility] for ICU admission/further management.  Interim History / Subjective:  Complains of typical body aches/pains usually treated with oxycodone.   Objective:  Blood pressure Marland Kitchen)  117/51, pulse 70, temperature 97.8 F (36.6 C), temperature source Oral, resp. rate 17, height '5\' 10"'$  (1.778 m), weight 84.4 kg, SpO2 100 %.        Intake/Output Summary (Last 24 hours) at 03/23/2022 0907 Last data filed at 03/23/2022 1962 Gross per 24 hour  Intake 2809.82 ml  Output --  Net 2809.82 ml    Filed Weights   03/22/22 1235 03/23/22 0458  Weight: 79 kg 84.4 kg   Physical Examination:   General: Frail elderly gentleman in NAD HEENT: [place]/AT, PERRL, no JVD Neuro: Alert, oriented x 3. Non-focal.  CV: RRR. 3/6 SEM.  PULM: No distress. Clear bilateral breath sounds GI: Soft, non-tender, non-distended. Hypoeractive.   Extremities: No acute deformity or ROM limitation.  Skin: Grossly intact.   Resolved Hospital Problem List:    Assessment & Plan:   Shock: etiology uncertain. Ongoing issues with ascites so cannot rule out sepsis 2/2 bacterial peritonitis. Small ascites on ultrasound. S/p 3 L in ED.  - Norepinephrine and vasopressin for MAP > 65 mmHg - Trend LA, normal at present - Echocardiogram pending - F/u Cx data - Empiric coverage with ceftriaxone, given recent TIPS  Hepatocellular carcinoma s/p SBRT Cirrhosis Portal hypertension Esophageal varices, PHG Ascites, s/p TIPS 6/9: Tolerated procedure well with gradient reduction from 13 to 2. - Continue Rifaximin - Resume lactulose - Holding home Lasix in the setting of dehydration, AKI/ARF - Surveillance monitoring for [facility] per protocol - Trend LFT  Acute renal failure, likely 2/2 ATN due to hypotension and dehydration in the setting of CKD. - Creatinine starting to improve. No UOP since transfer. Bladder scan now.  - Replete electrolytes as indicated - Monitor I&Os - F/u urine studies - Nephrology consult if no significant improvement in Cr/UOP  HFrEF (EF improved to 55-60%, previously ~20%) Mild AS Hypertension Hyperlipidemia Most recent Echo 02/2022 with EF 55-60%, G1DD. - Hold home Entresto in the setting of severe AKI/ARF - Hold home Coreg in the setting of hypotension - Continue statin - Cardiac monitoring - Monitor I&Os, goal euvolemia  COPD Prior tobacco use - Continue supplemental O2 support - Wean O2 for sat > 90% - Bronchodilators PRN - Pulmonary hygiene, IS - Encourage mobility  IDA Thrombocytopenia Elevated INR in the setting of liver dysfunction - Trend CBC - Transfuse for Hgb <  7.0 or hemodynamically significant bleeding - Transfuse for Plt < 20K  T2DM - CBGs Q4H while PO intake remains poor - Goal 140s-180s - SSI  Gastric and duodenal ulcers Diverticulosis - Continue PPI  Depression, anxiety - Resume home Lexapro, Buspar  FTT - Nutrition consult for assessment of caloric needs, supplement addition - 2G Na diet - PT/OT evaluation, may benefit from home health therapies  Best Practice: (right click and "Reselect all SmartList Selections" daily)   Diet/type: Regular consistency (see orders) 2g Na DVT prophylaxis: SCDs, consider SQH monitoring Plt/INR GI prophylaxis: PPI Lines: Central line Foley:  N/A Code Status:  full code - patient has had both full code and DNR code statuses in the last year, given his encephalopathy at present will keep patient full code until able to speak to family or patient mental status improves. Last date of multidisciplinary goals of care discussion [Pending - d/w patient at bedside 6/14AM but encephalopathic, left VM for family]   Critical care time: 45 minutes    Georgann Housekeeper, AGACNP-BC [place] for personal pager PCCM on call pager [phone] until 7pm. Please call  Elink 7p-7a. [phone]  03/23/2022 9:28 AM

## 2022-03-24 ENCOUNTER — Inpatient Hospital Stay (HOSPITAL_COMMUNITY): Payer: Medicare HMO

## 2022-03-24 ENCOUNTER — Inpatient Hospital Stay: Payer: Medicare HMO

## 2022-03-24 DIAGNOSIS — N171 Acute kidney failure with acute cortical necrosis: Secondary | ICD-10-CM | POA: Diagnosis not present

## 2022-03-24 DIAGNOSIS — K7469 Other cirrhosis of liver: Secondary | ICD-10-CM

## 2022-03-24 DIAGNOSIS — R188 Other ascites: Secondary | ICD-10-CM | POA: Diagnosis not present

## 2022-03-24 DIAGNOSIS — N179 Acute kidney failure, unspecified: Secondary | ICD-10-CM | POA: Diagnosis not present

## 2022-03-24 LAB — HEPATIC FUNCTION PANEL
ALT: 164 U/L — ABNORMAL HIGH (ref 0–44)
AST: 193 U/L — ABNORMAL HIGH (ref 15–41)
Albumin: 3.2 g/dL — ABNORMAL LOW (ref 3.5–5.0)
Alkaline Phosphatase: 305 U/L — ABNORMAL HIGH (ref 38–126)
Bilirubin, Direct: 1.2 mg/dL — ABNORMAL HIGH (ref 0.0–0.2)
Indirect Bilirubin: 0.9 mg/dL (ref 0.3–0.9)
Total Bilirubin: 2.1 mg/dL — ABNORMAL HIGH (ref 0.3–1.2)
Total Protein: 5.3 g/dL — ABNORMAL LOW (ref 6.5–8.1)

## 2022-03-24 LAB — CBC
HCT: 21.5 % — ABNORMAL LOW (ref 39.0–52.0)
Hemoglobin: 7 g/dL — ABNORMAL LOW (ref 13.0–17.0)
MCH: 30.8 pg (ref 26.0–34.0)
MCHC: 32.6 g/dL (ref 30.0–36.0)
MCV: 94.7 fL (ref 80.0–100.0)
Platelets: 83 10*3/uL — ABNORMAL LOW (ref 150–400)
RBC: 2.27 MIL/uL — ABNORMAL LOW (ref 4.22–5.81)
RDW: 19.9 % — ABNORMAL HIGH (ref 11.5–15.5)
WBC: 4.3 10*3/uL (ref 4.0–10.5)
nRBC: 0 % (ref 0.0–0.2)

## 2022-03-24 LAB — BASIC METABOLIC PANEL
Anion gap: 11 (ref 5–15)
Anion gap: 8 (ref 5–15)
BUN: 56 mg/dL — ABNORMAL HIGH (ref 8–23)
BUN: 57 mg/dL — ABNORMAL HIGH (ref 8–23)
CO2: 21 mmol/L — ABNORMAL LOW (ref 22–32)
CO2: 21 mmol/L — ABNORMAL LOW (ref 22–32)
Calcium: 8.8 mg/dL — ABNORMAL LOW (ref 8.9–10.3)
Calcium: 8.9 mg/dL (ref 8.9–10.3)
Chloride: 104 mmol/L (ref 98–111)
Chloride: 109 mmol/L (ref 98–111)
Creatinine, Ser: 3.66 mg/dL — ABNORMAL HIGH (ref 0.61–1.24)
Creatinine, Ser: 3.63 mg/dL — ABNORMAL HIGH (ref 0.61–1.24)
GFR, Estimated: 16 mL/min — ABNORMAL LOW (ref >60)
GFR, Estimated: 16 mL/min — ABNORMAL LOW (ref >60)
Glucose, Bld: 152 mg/dL — ABNORMAL HIGH (ref 70–99)
Glucose, Bld: 117 mg/dL — ABNORMAL HIGH (ref 70–99)
Potassium: 5.4 mmol/L — ABNORMAL HIGH (ref 3.5–5.1)
Potassium: 5.5 mmol/L — ABNORMAL HIGH (ref 3.5–5.1)
Sodium: 136 mmol/L (ref 135–145)
Sodium: 138 mmol/L (ref 135–145)

## 2022-03-24 LAB — GLUCOSE, CAPILLARY
Glucose-Capillary: 100 mg/dL — ABNORMAL HIGH (ref 70–99)
Glucose-Capillary: 104 mg/dL — ABNORMAL HIGH (ref 70–99)
Glucose-Capillary: 131 mg/dL — ABNORMAL HIGH (ref 70–99)
Glucose-Capillary: 131 mg/dL — ABNORMAL HIGH (ref 70–99)
Glucose-Capillary: 144 mg/dL — ABNORMAL HIGH (ref 70–99)
Glucose-Capillary: 94 mg/dL (ref 70–99)

## 2022-03-24 LAB — BODY FLUID CELL COUNT WITH DIFFERENTIAL
Eos, Fluid: 0 %
Lymphs, Fluid: 70 %
Monocyte-Macrophage-Serous Fluid: 25 % — ABNORMAL LOW (ref 50–90)
Neutrophil Count, Fluid: 5 % (ref 0–25)
Total Nucleated Cell Count, Fluid: 12 cu mm (ref 0–1000)

## 2022-03-24 LAB — ACTH STIMULATION, 3 TIME POINTS
Cortisol, 30 Min: 39.1 ug/dL
Cortisol, 60 Min: 47.1 ug/dL
Cortisol, Base: 37.1 ug/dL

## 2022-03-24 LAB — PROTIME-INR
INR: 1.8 — ABNORMAL HIGH (ref 0.8–1.2)
Prothrombin Time: 20.6 seconds — ABNORMAL HIGH (ref 11.4–15.2)

## 2022-03-24 LAB — URINE CULTURE: Culture: NO GROWTH

## 2022-03-24 LAB — MAGNESIUM: Magnesium: 1.9 mg/dL (ref 1.7–2.4)

## 2022-03-24 LAB — AMMONIA: Ammonia: 62 umol/L — ABNORMAL HIGH (ref 9–35)

## 2022-03-24 LAB — PHOSPHORUS: Phosphorus: 4.9 mg/dL — ABNORMAL HIGH (ref 2.5–4.6)

## 2022-03-24 MED ORDER — NOREPINEPHRINE 4 MG/250ML-% IV SOLN: 0.0000 ug/min | INTRAVENOUS | Status: DC

## 2022-03-24 MED ORDER — SODIUM POLYSTYRENE SULFONATE 15 GM/60ML PO SUSP
15.0000 g | Freq: Four times a day (QID) | ORAL | Status: AC
Start: 2022-03-24 — End: 2022-03-24
  Administered 2022-03-24 (×2): 15 g via RECTAL
  Filled 2022-03-24 (×2): qty 60

## 2022-03-24 MED ORDER — SODIUM ZIRCONIUM CYCLOSILICATE 10 G PO PACK
10.0000 g | PACK | Freq: Once | ORAL | Status: AC
Start: 2022-03-24 — End: 2022-03-24
  Administered 2022-03-24: 10 g
  Filled 2022-03-24: qty 1

## 2022-03-24 MED ORDER — RIFAXIMIN 550 MG PO TABS
550.0000 mg | ORAL_TABLET | Freq: Two times a day (BID) | ORAL | Status: DC
Start: 2022-03-24 — End: 2022-04-04
  Administered 2022-03-24 – 2022-04-04 (×22): 550 mg
  Filled 2022-03-24 (×24): qty 1

## 2022-03-24 MED ORDER — ORAL CARE MOUTH RINSE: 15.0000 mL | OROMUCOSAL | Status: DC | PRN

## 2022-03-24 MED ORDER — SODIUM ZIRCONIUM CYCLOSILICATE 10 G PO PACK: 10.0000 g | PACK | Freq: Once | ORAL | Status: DC

## 2022-03-24 MED ORDER — HALOPERIDOL LACTATE 5 MG/ML IJ SOLN
1.0000 mg | Freq: Once | INTRAMUSCULAR | Status: AC | PRN
  Administered 2022-03-24: 1 mg via INTRAVENOUS
  Filled 2022-03-24: qty 1

## 2022-03-24 MED ORDER — BUSPIRONE HCL 15 MG PO TABS: 7.5000 mg | ORAL_TABLET | Freq: Every day | ORAL | Status: DC

## 2022-03-24 MED ORDER — SODIUM CHLORIDE 0.9 % IV SOLN
2.0000 g | INTRAVENOUS | Status: AC
  Administered 2022-03-24 – 2022-03-27 (×3): 2 g via INTRAVENOUS
  Filled 2022-03-24 (×3): qty 20

## 2022-03-24 MED ORDER — BUSPIRONE HCL 15 MG PO TABS: 7.5000 mg | ORAL_TABLET | Freq: Every day | ORAL | Status: DC | PRN

## 2022-03-24 MED ORDER — LACTULOSE 10 GM/15ML PO SOLN
20.0000 g | Freq: Three times a day (TID) | ORAL | Status: DC
  Administered 2022-03-24 (×2): 20 g
  Filled 2022-03-24 (×2): qty 30

## 2022-03-24 MED ORDER — PANTOPRAZOLE SODIUM 40 MG IV SOLR
40.0000 mg | Freq: Two times a day (BID) | INTRAVENOUS | Status: DC
  Administered 2022-03-24 – 2022-04-04 (×22): 40 mg via INTRAVENOUS
  Filled 2022-03-24 (×22): qty 10

## 2022-03-24 NOTE — Progress Notes (Signed)
Patient had NG placed at approximately 1330. Georgann Housekeeper NP was at bedside when KUB was completed was able to see xray. Per Eddie Dibbles okay to use NG tube.

## 2022-03-24 NOTE — IPAL (Signed)
  Interdisciplinary Goals of Care Family Meeting   Date carried out: 03/24/2022  Location of the meeting: Bedside  Member's involved: Nurse Practitioner, Bedside Registered Nurse, and Family Member or next of kin  Durable Power of Attorney or acting medical decision maker: Acupuncturist (Daughter)    Discussion: We discussed goals of care for [employer] .  She understands his course in worsening and he is only marginally able to protect his airway at this time. He has been clear he would not want aggressive measures, which are likely to leave him in a dependent state. He is fiercly independent and would never want to live in a rest home. If he were to worsen family is in favor of shifting our focus to comfort.   Code status: Full DNR  Disposition: Continue current acute care short of intubation or cardiac arrest heroics.   Time spent for the meeting: 25 minutes    Georgann Housekeeper, AGACNP-BC [place] for personal pager PCCM on call pager [phone] until 7pm. Please call Elink 7p-7a. [phone]  03/24/2022 11:22 AM

## 2022-03-24 NOTE — Progress Notes (Signed)
NAME:  [name], MRN:  [id number], DOB:  1941-04-06, LOS: 2 ADMISSION DATE:  03/22/2022 CONSULTATION DATE:  03/23/2022 REFERRING MD:  Langston Masker - EDP ([facility]) CHIEF COMPLAINT:  Hypotension, FTT  History of Present Illness:  81 year old man who presented to [facility] 6/13 for weakness, decreased mobility and poor PO intake s/p fall 3 days PTA. PMHx significant for HTN, HLD, mild AS, HFrEF (EF as low as 20-25%, recovered with Echo 02/2022 EF 55-60%, G1DD), COPD, prior tobacco use, T2DM, NASH cirrhosis with portal hypertension as evidenced by EV, PHG, ascites requiring frequent paracentesis (c/b SBP) and HCC (s/p radiation), gastric/duodenal ulcers and diverticulosis, IDA. Recently underwent TIPS placement with IR 6/9 with gradient reduction from 13 to 2. Monitored x 24H and discharged home 6/10.  Patient reportedly had a fall 6/10 shortly after arriving home, sustaining a L arm injury. Since injury, patient's mobility has been decreased and appetite has been poor; additionally reporting decreased UOP and constipation. Patient was advised by IR to present to ED for further evaluation. Presented to [facility] ED 6/13 for weakness, decreased mobility and poor PO intake s/p fall. Vitals on ED arrival were notable for BP 89/35. Labs notable for new transaminitis with AST 545/ALT 293, Cr 4.78 (baseline 1.3-1.8), INR 1.5, WBC/LA WNL. Hgb 8.7, near baseline. CXR with small L pleural effusion. CT C-spine and L arm/shoulder imaging negative. Fluid resuscitation was initiated (3L) with slight improvement in SBPs to 80s. RIJ CVC placed and Levophed was initiated for additional BP support.  PCCM consulted for ICU admission/transfer to [facility].  Pertinent Medical History:   Past Medical History:  Diagnosis Date   Acute renal failure ([place])    Aortic atherosclerosis ([place])    Benign prostatic hypertrophy    Blood transfusion without reported diagnosis    CHF (congestive heart failure) (HCC)    a. EF 20-25% by echo in 11/2019   COPD  (chronic obstructive pulmonary disease) ([place]) 12/15/2009   FEV1 2.30 (70%) ratio 63 no better with B2 and DLCO 18/6 (73%) corrects to 106%   Degenerative joint disease    Depression    Diverticulosis    Duodenal varices    Esophageal varices (HCC)    Essential hypertension    ACE inhibitor cough   Fatty liver    Gastric ulcer    Hepatic cirrhosis (HCC)    Hepatocellular carcinoma (HCC)    Hyperlipidemia    IDA (iron deficiency anemia)    Internal hemorrhoids    Liver cancer ([place])    Low back pain    Otitis externa 07/30/2012   Pneumonia    Portal hypertensive gastropathy (HCC)    SBP (spontaneous bacterial peritonitis) ([place])    Sepsis (HCC)    Splenomegaly    Thrombocytopenia (HCC)    Type 2 diabetes mellitus ([place])    no meds   Ulcer of foot ([place])    Right foot   Significant Hospital Events: Including procedures, antibiotic start and stop dates in addition to other pertinent events   6/9 - TIPS placement at [facility] with IR, tolerated procedure well. Gradient reduced from 13 to 2. 6/10 - Discharged home. Sustained fall at home and L arm injury (imaging negative for fx/dislocation). 6/13 - Presented to [facility] for weakness, poor PO intake, FTT post-fall. Decreased UOP/constipation. Lab findings notable for new transaminitis, acute renal failure. Hypotensive requiring fluid resuscitation and pressor initiation. Transfer to [facility] for ICU admission/further management.  Interim History / Subjective:  Agitated overnight treated with haldol Less responsive this  morning. Ammonia up. Remains anuric despite improved renal function labs.   Objective:  Blood pressure (!) 92/54, pulse 90, temperature 97.6 F (36.4 C), temperature source Axillary, resp. rate 12, height '5\' 10"'$  (1.778 m), weight 84.4 kg, SpO2 97 %.        Intake/Output Summary (Last 24 hours) at 03/24/2022 1048 Last data filed at 03/24/2022 8921 Gross per 24 hour  Intake 943.02 ml  Output 250 ml  Net 693.02 ml    Filed  Weights   03/22/22 1235 03/23/22 0458  Weight: 79 kg 84.4 kg   Physical Examination:  General: Frail elderly gentleman  HEENT: [place]/AT, PERRL, no JVD Neuro: Lethargic. Responds only to pain with combativeness.  CV: RRR, 3/6 SEM PULM: Clear bilateral breath sounds, upper airway gurgling.  GI: Soft, NT, ND  Extremities: No acute deformity Skin: Grossly intact  Resolved Hospital Problem List:    Assessment & Plan:   Shock: etiology uncertain. Ongoing issues with ascites so cannot rule out sepsis 2/2 bacterial peritonitis. Small ascites on ultrasound. S/p 3 L in ED.  - Norepinephrine and vasopressin for MAP > 65 mmHg - Echocardiogram pending - F/u Cx data including peritoneal fluid - Ceftriaxone with stop date for 5 days. Cultures pending.   Hepatocellular carcinoma s/p SBRT Cirrhosis Portal hypertension Esophageal varices, PHG Ascites, s/p TIPS 6/9: Tolerated procedure well with gradient reduction from 13 to 2. - Continue Rifaximin, lactulose. Will need enteral access.  - Holding home Lasix in the setting of dehydration, AKI/ARF - Surveillance monitoring for [facility] per protocol - Trend LFT  Acute renal failure, likely 2/2 ATN due to hypotension and dehydration in the setting of CKD.  Hyperkalemia - Creatinine slowly improving, but still anuric.  - Replete electrolytes as indicated - Monitor I&Os - Would not want aggressive measures such as HD per family.  HFrEF (EF improved to 55-60%, previously ~20%) Mild AS Hypertension Hyperlipidemia Most recent Echo 02/2022 with EF 55-60%, G1DD. - Hold home Entresto in the setting of severe AKI/ARF - Hold home Coreg in the setting of hypotension - Hold statin - Cardiac monitoring - Monitor I&Os, goal euvolemia  COPD Prior tobacco use - Continue supplemental O2 support - Wean O2 for sat > 90% - Bronchodilators PRN - Pulmonary hygiene, IS - Would not want intubation per family  IDA Thrombocytopenia Elevated INR in the setting  of liver dysfunction - Trend CBC - Transfuse for Hgb < 7.0 or hemodynamically significant bleeding - Transfuse for Plt < 20K  T2DM - CBGs Q4H while PO intake remains poor - Goal 140s-180s - SSI  Gastric and duodenal ulcers Diverticulosis - Continue PPI  Depression, anxiety - Holding home home Lexapro, Buspar  FTT - supportive care  Best Practice: (right click and "Reselect all SmartList Selections" daily)   Diet/type: Regular consistency (see orders) NPO DVT prophylaxis: SCDs, consider SQH monitoring Plt/INR GI prophylaxis: PPI Lines: Central line Foley:  N/A Code Status:  DNR: discussed with daughter [name] this morning 6/15. Clear he would not want aggressive measures which are likely to leave him in a dependent state. Fiercly independent man. Would not accept living in SNF.    Critical care time: 45 minutes    Georgann Housekeeper, AGACNP-BC [facility]  See [place] for personal pager PCCM on call pager [phone] until 7pm. Please call Elink 7p-7a. [phone]  03/24/2022 10:48 AM

## 2022-03-24 NOTE — Progress Notes (Signed)
[place] Progress Note Patient Name: [name] DOB: December 04, 1940 MRN: [id number]   Date of Service  03/24/2022  HPI/Events of Note  Patient remains extremely agitated and delirious  QTC  442.Marland Kitchen  eICU Interventions  Haldol 1 mg iv ordered.        Kerry Kass Atthew Coutant 03/24/2022, 2:48 AM

## 2022-03-24 NOTE — Progress Notes (Addendum)
[place] Progress Note Patient Name: [name] DOB: 1941-05-18 MRN: [id number]   Date of Service  03/24/2022  HPI/Events of Note  K+ 5.4, Cr 3.66, GFR 16, Hgb 7.0.  eICU Interventions  Kayexalate 15 gm rectally Q 6 x 2 ordered, Transfuse for hemoglobin < 7.0.        Kerry Kass Tenea Sens 03/24/2022, 4:32 AM

## 2022-03-24 NOTE — Progress Notes (Signed)
Referring Physician(s): PCCM  Supervising Physician: Ruthann Cancer  Patient Status:  [name] - In-pt  Chief Complaint:  Shock with multi-organ failure including elevated transaminitis and AKI. Recent TIPS 03/18/22.   Subjective:  Patient sitting in bed, sleeping, NAD. Not arousal to verbal stimuli.  RN at bedside, states that the patient has been sleeping all day.  RN states that team is planning  on [place] placement today.  [place] meeting with family today, family wishes if the patient condition worsens, favor of shifting to comfort care.   Allergies: Patient has no known allergies.  Medications: Prior to Admission medications   Medication Sig Start Date End Date Taking? Authorizing Provider  albuterol (VENTOLIN HFA) 108 (90 Base) MCG/ACT inhaler Inhale 2 puffs into the lungs every 6 (six) hours as needed for wheezing or shortness of breath. 11/01/21  Yes [name], MD  busPIRone (BUSPAR) 5 MG tablet Take 5 mg by mouth 2 (two) times daily as needed (anxiety).   Yes [provider]  busPIRone (BUSPAR) 7.5 MG tablet Take 1 tablet (7.5 mg total) by mouth 2 (two) times daily as needed. for anxiety Patient taking differently: Take 7.5 mg by mouth 2 (two) times daily as needed (anxiety). 05/14/21  Yes [name], MD  carvedilol (COREG) 6.25 MG tablet Take 1 tablet (6.25 mg total) by mouth 2 (two) times daily with a meal. 02/01/22  Yes [name], MD  ciprofloxacin (CIPRO) 250 MG tablet Take 1 tablet (250 mg total) by mouth daily. Patient taking differently: Take 250 mg by mouth daily. Continuous course. 02/09/22  Yes Pyrtle, Lajuan Lines, MD  ENTRESTO 24-26 MG Take 1 tablet by mouth 2 (two) times daily. 01/26/22  Yes [provider]  escitalopram (LEXAPRO) 5 MG tablet Take 1 tablet (5 mg total) by mouth daily. 05/14/21  Yes [name], MD  finasteride (PROSCAR) 5 MG tablet TAKE 1 TABLET EVERY DAY Patient taking differently: Take 5 mg by mouth daily. 01/10/22   Yes [name], MD  furosemide (LASIX) 20 MG tablet Take 20 mg by mouth daily. 01/04/22  Yes [provider]  gabapentin (NEURONTIN) 300 MG capsule TAKE 1 CAPSULE TWICE DAILY Patient taking differently: Take 300 mg by mouth 2 (two) times daily as needed (pain). 03/15/21  Yes [name], MD  lactulose (CHRONULAC) 10 GM/15ML solution Take 30 mLs (20 g total) by mouth 3 (three) times daily. 03/19/22 04/18/22 Yes Annet Manukyan H, PA-C  metFORMIN (GLUCOPHAGE) 500 MG tablet Take 500 mg by mouth 3 (three) times daily.   Yes [provider]  oxyCODONE (ROXICODONE) 5 MG immediate release tablet Take 2.5 mg by mouth every 6 (six) hours as needed for severe pain.   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 03/10/22 2022-04-12 Yes [name], MD  pantoprazole (PROTONIX) 40 MG tablet Take 1 tablet (40 mg total) by mouth 2 (two) times daily. 02/01/22  Yes [name], MD  rifaximin (XIFAXAN) 550 MG TABS tablet Take 1 tablet (550 mg total) by mouth 2 (two) times daily. 02/14/22  Yes Pyrtle, Lajuan Lines, MD  rosuvastatin (CRESTOR) 10 MG tablet Take 10 mg by mouth daily.   Yes [provider]  spironolactone (ALDACTONE) 50 MG tablet Take 50 mg by mouth daily.   Yes [provider]  sucralfate (CARAFATE) 1 g tablet  Take 1 tablet (1 g total) by mouth 4 (four) times daily. Patient taking differently: Take 1 g by mouth 2 (two) times daily. 02/03/22 02/03/23 Yes Nandigam, Venia Minks, MD  tamsulosin (FLOMAX) 0.4 MG CAPS capsule TAKE 1 CAPSULE EVERY DAY Patient taking differently: Take 0.4 mg by mouth daily. 03/14/22  Yes [name], MD  rosuvastatin (CRESTOR) 5 MG tablet TAKE 1 TABLET EVERY DAY Patient not taking: Reported on 03/23/2022 11/16/21   [name], MD     Vital Signs: BP (!) 92/54   Pulse 90   Temp 97.6 F  (36.4 C) (Axillary)   Resp 12   Ht '5\' 10"'$  (1.778 m)   Wt 186 lb 1.1 oz (84.4 kg)   SpO2 97%   BMI 26.70 kg/m   Physical Exam Vitals and nursing note reviewed.  Constitutional:      General: He is not in acute distress. HENT:     Head: Normocephalic.  Abdominal:     General: Abdomen is flat.     Palpations: Abdomen is soft.  Musculoskeletal:     Cervical back: Neck supple.  Skin:    General: Skin is warm and dry.     Coloration: Skin is not jaundiced.     Imaging: ECHOCARDIOGRAM LIMITED  Result Date: 03/23/2022    ECHOCARDIOGRAM LIMITED REPORT   Patient Name:   [name] Date of Exam: 03/23/2022 Medical Rec #:  [id number]      Height:       70.0 in Accession #:    [id number]     Weight:       186.1 lb Date of Birth:  1940/11/10      BSA:          2.024 m Patient Age:    81 years       BP:           107/49 mmHg Patient Gender: M              HR:           74 bpm. Exam Location:  Inpatient Procedure: 2D Echo, Cardiac Doppler, Color Doppler and Limited Echo Indications:    Other cardiac sounds  History:        Patient has prior history of Echocardiogram examinations. COPD;                 Risk Factors:Hypertension and Diabetes.  Sonographer:    Jyl Heinz Referring Phys: [id number]  1. Left ventricular ejection fraction, by estimation, is 65 to 70%. The left ventricle has normal function. The left ventricle has no regional wall motion abnormalities. Left ventricular diastolic parameters were normal.  2. Right ventricular systolic function is normal. The right ventricular size is normal.  3. The mitral valve is normal in structure. Mild mitral valve regurgitation.  4. Difficult acoustic windows. AV is not well seen. Peak and mean gradients through the valve are 31 and 18 mm HG respectivley. 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 Compared to echo report from May 2023, mean gradient is increased (13 to 18 mm Hg), and  dimensionless index is lower (0.43 to 0.34).  5. Aortic dilatation noted. There is mild dilatation of the aortic root, measuring 39 mm. FINDINGS  Left Ventricle: Left ventricular ejection fraction, by estimation, is 65 to 70%. The left ventricle has normal function. The left ventricle has no regional wall motion abnormalities. The left ventricular  internal cavity size was normal in size. There is  no left ventricular hypertrophy. Left ventricular diastolic parameters were normal. Right Ventricle: The right ventricular size is normal. Right vetricular wall thickness was not assessed. Right ventricular systolic function is normal. Pericardium: There is no evidence of pericardial effusion. Mitral Valve: The mitral valve is normal in structure. Mild mitral valve regurgitation. Tricuspid Valve: The tricuspid valve is grossly normal. Tricuspid valve regurgitation is trivial. Aortic Valve: Difficult acoustic windows. AV is not well seen. Peak and mean gradients through the valve are 31 and 18 mm HG respectivley. 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 Compared to echo report from May 2023, mean gradient is increased (13 to 18 mm Hg), and dimensionless index is lower (0.43 to 0.34). Aortic valve mean gradient measures 17.3 mmHg. Aortic valve peak gradient measures 29.8 mmHg. Aortic valve area, by VTI measures 1.40 cm. Pulmonic Valve: The pulmonic valve was normal in structure. Pulmonic valve regurgitation is not visualized. Aorta: Aortic dilatation noted. There is mild dilatation of the aortic root, measuring 39 mm. LEFT VENTRICLE PLAX 2D LVIDd:         4.40 cm      Diastology LVIDs:         2.80 cm      LV e' medial:    9.48 cm/s LV PW:         1.10 cm      LV E/e' medial:  11.5 LV IVS:        1.00 cm      LV e' lateral:   8.55 cm/s LVOT diam:     2.28 cm      LV E/e' lateral: 12.7 LV SV:         83 LV SV Index:   41 LVOT Area:     4.09 cm  LV Volumes (MOD) LV vol d, MOD A2C: 100.0  ml LV vol d, MOD A4C: 128.0 ml LV vol s, MOD A2C: 32.6 ml LV vol s, MOD A4C: 45.9 ml LV SV MOD A2C:     67.4 ml LV SV MOD A4C:     128.0 ml LV SV MOD BP:      76.0 ml RIGHT VENTRICLE             IVC RV S prime:     13.50 cm/s  IVC diam: 2.30 cm TAPSE (M-mode): 2.3 cm LEFT ATRIUM         Index LA diam:    3.30 cm 1.63 cm/m  AORTIC VALVE AV Area (Vmax):    1.38 cm AV Area (Vmean):   1.35 cm AV Area (VTI):     1.40 cm AV Vmax:           273.00 cm/s AV Vmean:          202.000 cm/s AV VTI:            0.594 m AV Peak Grad:      29.8 mmHg AV Mean Grad:      17.3 mmHg LVOT Vmax:         92.10 cm/s LVOT Vmean:        66.750 cm/s LVOT VTI:          0.204 m LVOT/AV VTI ratio: 0.34  AORTA Ao Root diam: 3.90 cm Ao Asc diam:  3.40 cm MITRAL VALVE                TRICUSPID VALVE MV  Area (PHT): 3.56 cm     TR Peak grad:   29.8 mmHg MV Decel Time: 213 msec     TR Vmax:        273.00 cm/s MV E velocity: 109.00 cm/s MV A velocity: 95.90 cm/s   SHUNTS MV E/A ratio:  1.14         Systemic VTI:  0.20 m                             Systemic Diam: 2.28 cm Dorris Carnes MD Electronically signed by Dorris Carnes MD Signature Date/Time: 03/23/2022/6:21:00 PM    Final    US Abdomen Complete  Result Date: 03/23/2022 CLINICAL DATA:  Acute encephalopathy. EXAM: ABDOMEN ULTRASOUND COMPLETE COMPARISON:  CT of the abdomen pelvis dated 02/04/2022. FINDINGS: Evaluation is very limited due to portable technique. Gallbladder: Not visualized. Common bile duct: Diameter: 13 mm. There is dilatation of the common bile duct. Liver: Fatty liver with changes of cirrhosis. Portal vein is patent on color Doppler imaging with normal direction of blood flow towards the liver. IVC: Suboptimally visualized. Pancreas: Not well seen. Spleen: Size and appearance within normal limits. Right Kidney: Length: 10.7 cm. Mild increased echogenicity. No hydronephrosis or shadowing stone. Left Kidney: Length: 10.2 cm. Mild increased echogenicity. No hydronephrosis or shadowing  stone Abdominal aorta: No aneurysm visualized. Other findings: Small ascites. Small bilateral pleural effusions noted. IMPRESSION: 1. Cirrhosis and small ascites. 2. Patent main portal vein with hepatopetal flow. 3. Small ascites and bilateral pleural effusions. Electronically Signed   By: Anner Crete M.D.   On: 03/23/2022 03:44   DG Chest Port 1V same Day  Result Date: 03/22/2022 CLINICAL DATA:  Central line placement. EXAM: PORTABLE CHEST 1 VIEW COMPARISON:  Chest radiograph dated 03/22/2022. FINDINGS: Interval placement of a right IJ central venous line with tip projecting over the right atrium close to the cavoatrial junction. There is shallow inspiration. Probable small left pleural effusion and left lung base atelectasis. Pneumonia is not excluded. No pneumothorax. There is cardiomegaly mild vascular congestion. Atherosclerotic calcification of the aorta. Osteopenia with degenerative changes of the spine. No acute osseous pathology. IMPRESSION: Interval placement of a right IJ central venous line with tip projecting over the right atrium close to the cavoatrial junction. No pneumothorax. Electronically Signed   By: Anner Crete M.D.   On: 03/22/2022 19:48   DG Chest Portable 1 View  Result Date: 03/22/2022 CLINICAL DATA:  Generalized weakness. History of congestive heart failure. EXAM: PORTABLE CHEST 1 VIEW COMPARISON:  11/02/2021 FINDINGS: Heart size upper limits of normal. Chronic aortic atherosclerosis. Probable small effusions, at least on the left. Minimal basilar atelectasis on the left. Upper lungs are clear. No widespread pulmonary edema. IMPRESSION: Aortic atherosclerosis. Probable pleural effusion, at least on the left, with mild left base volume loss. Electronically Signed   By: Nelson Chimes M.D.   On: 03/22/2022 14:47   CT Cervical Spine Wo Contrast  Result Date: 03/22/2022 CLINICAL DATA:  Neck pain extending into the left arm after falling 3 days ago. EXAM: CT CERVICAL SPINE  WITHOUT CONTRAST TECHNIQUE: Multidetector CT imaging of the cervical spine was performed without intravenous contrast. Multiplanar CT image reconstructions were also generated. RADIATION DOSE REDUCTION: This exam was performed according to the departmental dose-optimization program which includes automated exposure control, adjustment of the mA and/or kV according to patient size and/or use of iterative reconstruction technique. COMPARISON:  None Available. FINDINGS: Despite efforts  by the technologist and patient, mild motion artifact is present on today's exam and could not be eliminated. This reduces exam sensitivity and specificity. Alignment: 6 mm of anterolisthesis at C4-5.  Otherwise anatomic. Skull base and vertebrae: No evidence of acute fracture or traumatic subluxation. There is multilevel spondylosis with disc space narrowing, uncinate spurring and facet hypertrophy. Facet hypertrophy at C4-5 is felt to account for the anterolisthesis noted above. Soft tissues and spinal canal: No prevertebral fluid or swelling. No visible canal hematoma. Bilateral carotid atherosclerosis. Disc levels: Multilevel spondylosis with disc space narrowing, uncinate spurring and bilateral facet hypertrophy. At C3-4, there is moderate foraminal narrowing, worse on the left. At C4-5, there is mild to moderate spinal stenosis and severe foraminal narrowing bilaterally due to the anterolisthesis. Severe foraminal narrowing is also present bilaterally at C5-6 due to spondylosis. Asymmetric severe left foraminal narrowing at C6-7 due to spondylosis. No acute disc space findings identified. Upper chest: Unremarkable. Other: None. IMPRESSION: 1. No evidence of acute cervical spine fracture, traumatic subluxation or static signs of instability. Detail limited by motion artifact. 2. 6 mm of anterolisthesis due to facet disease at C4-5. 3. Multilevel spondylosis with resulting osseous foraminal narrowing as detailed above. Electronically  Signed   By: Richardean Sale M.D.   On: 03/22/2022 13:36   DG Shoulder Left  Result Date: 03/22/2022 CLINICAL DATA:  Fall EXAM: LEFT SHOULDER - 2+ VIEW COMPARISON:  None Available. FINDINGS: There is no evidence of fracture or dislocation. There is no evidence of arthropathy or other focal bone abnormality. Soft tissues are unremarkable. IMPRESSION: Negative. Electronically Signed   By: Macy Mis M.D.   On: 03/22/2022 13:28   DG Elbow Complete Left  Result Date: 03/22/2022 CLINICAL DATA:  Trauma, fall, pain EXAM: LEFT ELBOW - COMPLETE 3+ VIEW COMPARISON:  None Available. FINDINGS: No recent fracture or dislocation is seen. There is no significant displacement of posterior fat pad. Small bony spurs are noted. There are scattered vascular calcifications in the soft tissues. IMPRESSION: No recent fracture or dislocation is seen in the left elbow. Electronically Signed   By: Elmer Picker M.D.   On: 03/22/2022 13:22    Labs:  CBC: Recent Labs    03/22/22 1425 03/23/22 0002 03/23/22 0417 03/24/22 0304  WBC 5.1 7.8 5.4 4.3  HGB 8.7* 8.6* 8.5* 7.0*  HCT 28.3* 26.2* 25.5* 21.5*  PLT 87* 121* 88* 83*    COAGS: Recent Labs    03/18/22 0910 03/19/22 0139 03/22/22 1425 03/23/22 0002 03/23/22 0417 03/24/22 0304  INR 1.2   < > 1.5* 1.6* 1.5* 1.8*  APTT 30  --  40*  --   --   --    < > = values in this interval not displayed.    BMP: Recent Labs    03/23/22 0417 03/23/22 1009 03/23/22 1545 03/24/22 0304  NA 133* 133* 133* 136  K 5.6* 5.8* 5.3* 5.4*  CL 105 105 104 104  CO2 21* 20* 20* 21*  GLUCOSE 179* 171* 168* 152*  BUN 53* 56* 54* 56*  CALCIUM 8.4* 8.6* 8.5* 8.8*  CREATININE 4.53* 4.25* 4.02* 3.66*  GFRNONAA 12* 13* 14* 16*    LIVER FUNCTION TESTS: Recent Labs    03/19/22 0139 03/22/22 1425 03/23/22 0002 03/23/22 0417  BILITOT 2.0* 1.8* 2.0* 1.9*  AST 84* 545* 398* 360*  ALT 27 293* 242* 237*  ALKPHOS 115 393* 348* 349*  PROT 4.5* 5.0* 4.5* 4.5*   ALBUMIN 2.5* 2.5* 2.2* 2.2*  Assessment and Plan:  81 y.o. male with shock with multi-organ failure including elevated transaminitis and AKI. Recent elective TIPS on 6/9/2, performed by Dr. Serafina Royals.   [place] meeting this morning, if patient condition worsens, focus will be shift to comfort care.  Patient sleeping, not in acute distress and appears to be comfortable this morning.   S/p bedside paracentesis, Peritoneal fluid cx showed no WBC or organism x 1 day Echo yesterday showed normal right heart function  US liver doppler yesterday result pending.  LFTs improving  INR 1.8 (1.5 yesterday)  RF stable - urine output 250 mL yesterday, nephrology following  Ammonia slightly elevated at 62 (52 yesterday) - pt on lactulose 20 mg BID, had BM x 3 times yesterday - Discussed with Dr. Serafina Royals, recommends continuing current dose as increasing lactulose does may worsen dehydration. Cont monitoring ammonia, pt mental status, and LFTs. Hypotension - on Levophed   Further treatment plan per PCCM/ nephrology  Appreciate and agree with the plan.  IR to follow.    Electronically Signed: Tera Mater, PA-C 03/24/2022, 9:28 AM   I spent a total of 15 Minutes at the the patient's bedside AND on the patient's hospital floor or unit, greater than 50% of which was counseling/coordinating care for shock s/p TIPS.   This chart was dictated using voice recognition software.  Despite best efforts to proofread,  errors can occur which can change the documentation meaning.

## 2022-03-24 NOTE — Progress Notes (Signed)
Patient with decrease in GCS throughout my shift. Georgann Housekeeper NP was altered and came to assess patient. NP aware and goals of care conversation with family had. Discussion was made and decision by family for patient to become DNR.Will continue to monitor patient at this time.

## 2022-03-24 NOTE — Consult Note (Addendum)
Consultation  Referring Provider: CCM/ McQuaid Primary Care Physician:  [name], MD Primary Gastroenterologist:  Dr.Pyrtle  Reason for Consultation:  acute decompensation  post TIPS, shock  HPI: [name] is a 81 y.o. male, known to Dr. Hilarie Fredrickson with history of decompensated cirrhosis, complicated by portal hypertension, ascites, history of SBP and history of HCC status post SBRT.  Also with COPD, congestive heart failure, hypertension, diabetes mellitus, and chronic kidney disease stage III.  Also has been followed for CLL. He had been seen in the office by Dr. Hilarie Fredrickson on 02/09/2022 and due to issues with recurrent ascites and then recent discovery of a duodenal varix which was found at EGD (April 2023 at which time he was also noted to have grade 1 esophageal varices, portal gastropathy and a cratered gastric ulcer.) Decision was made to refer him for possible TIPS. Echo was done pre-TIPS showing EF of 55 to 60%, mild aortic stenosis MRI of the liver was done on 03/16/2022-showing no compelling findings of viable tumor, treated lesion in segment 4 without nodularity, treated lesion in segment 3 relatively occult/indistinct, no new arterial phase enhancing lesions, hepatic cirrhosis, bilateral renal atrophy and substantial abdominal ascites He was evaluated by IR, and determined to be an acceptable candidate for TIPS. He did undergo serial paracenteses up until the time of TIPS on 03/18/2022. Also underwent paracentesis on the day of TIPS. Discharged home on 03/19/2022, per notes he fell at home on 03/19/2022. On follow-up per IR on 03/22/2022 he was doing well at home, urinating very little, had mostly been bedbound, and not eating.  They were advised to bring him to the emergency room, he where he was found to be hypotensive and hypoxic, and required initiation of pressors.  Transferred here from [facility].  Presentation consistent with shock, and multiorgan failure with elevated LFTs  and significant AKI Started on IV Rocephin Work-up thus far with abdominal ultrasound showing a cirrhotic appearing liver, patent main portal vein, small amount of ascites and bilateral effusions  Ultrasound with Doppler pending this morning  Blood cultures showing no growth thus far Bedside diagnostic paracentesis yesterday Gram stain no WBCs and no organisms  Renal ultrasound pending  Patient had been delirious during the night, agitated, currently sleeping And did not arouse to voice.  No family in the room at present  Today's labs show WBC of 4.3/hemoglobin 7.0/hematocrit 21.5/platelets 83 Potassium 5.4/BUN 56/creatinine 3.66 INR 1.8/pro time 20.6 Ammonia 62 LFTs yesterday T. bili 1.9/alk phos 349/AST 360/ALT 237  IR has been consulted and are on board  Repeat 2D echo yesterday EF 65 to 70%, moderate AS    Past Medical History:  Diagnosis Date   Acute renal failure ([place])    Aortic atherosclerosis ([place])    Benign prostatic hypertrophy    Blood transfusion without reported diagnosis    CHF (congestive heart failure) ([place])    a. EF 20-25% by echo in 11/2019   COPD (chronic obstructive pulmonary disease) ([place]) 12/15/2009   FEV1 2.30 (70%) ratio 63 no better with B2 and DLCO 18/6 (73%) corrects to 106%   Degenerative joint disease    Depression    Diverticulosis    Duodenal varices    Esophageal varices (HCC)    Essential hypertension    ACE inhibitor cough   Fatty liver    Gastric ulcer    Hepatic cirrhosis (HCC)    Hepatocellular carcinoma (HCC)    Hyperlipidemia    IDA (iron deficiency anemia)  Internal hemorrhoids    Liver cancer ([place])    Low back pain    Otitis externa 07/30/2012   Pneumonia    Portal hypertensive gastropathy (HCC)    SBP (spontaneous bacterial peritonitis) ([place])    Sepsis (HCC)    Splenomegaly    Thrombocytopenia (HCC)    Type 2 diabetes mellitus ([place])    no meds   Ulcer of foot ([place])    Right foot    Past Surgical History:   Procedure Laterality Date   BIOPSY  07/14/2021   Procedure: BIOPSY;  Surgeon: Daryel November, MD;  Location: [facility] ENDOSCOPY;  Service: Gastroenterology;;  EGD and COLON   BIOPSY  02/03/2022   Procedure: BIOPSY;  Surgeon: Mauri Pole, MD;  Location: [facility] ENDOSCOPY;  Service: Gastroenterology;;   COLONOSCOPY  2020   JMP-MAC-plenvu (good)-polyps-recall 1 yr   COLONOSCOPY WITH PROPOFOL N/A 07/14/2021   Procedure: COLONOSCOPY WITH PROPOFOL;  Surgeon: Daryel November, MD;  Location: [facility] ENDOSCOPY;  Service: Gastroenterology;  Laterality: N/A;   ESOPHAGOGASTRODUODENOSCOPY (EGD) WITH PROPOFOL N/A 07/14/2021   Procedure: ESOPHAGOGASTRODUODENOSCOPY (EGD) WITH PROPOFOL;  Surgeon: Daryel November, MD;  Location: [place];  Service: Gastroenterology;  Laterality: N/A;   ESOPHAGOGASTRODUODENOSCOPY (EGD) WITH PROPOFOL N/A 02/03/2022   Procedure: ESOPHAGOGASTRODUODENOSCOPY (EGD) WITH PROPOFOL;  Surgeon: Mauri Pole, MD;  Location: [facility] ENDOSCOPY;  Service: Gastroenterology;  Laterality: N/A;   EYE SURGERY     HEMOSTASIS CLIP PLACEMENT  02/03/2022   Procedure: HEMOSTASIS CLIP PLACEMENT;  Surgeon: Mauri Pole, MD;  Location: [facility] ENDOSCOPY;  Service: Gastroenterology;;   IR EMBO TUMOR ORGAN ISCHEMIA INFARCT INC GUIDE ROADMAPPING  06/01/2020   IR INTRAVASCULAR ULTRASOUND NON CORONARY  03/18/2022   IR PARACENTESIS  08/30/2021   IR PARACENTESIS  09/22/2021   IR PARACENTESIS  10/12/2021   IR PARACENTESIS  11/02/2021   IR PARACENTESIS  11/15/2021   IR PARACENTESIS  11/30/2021   IR PARACENTESIS  12/14/2021   IR PARACENTESIS  12/27/2021   IR PARACENTESIS  01/04/2022   IR PARACENTESIS  01/12/2022   IR PARACENTESIS  01/19/2022   IR PARACENTESIS  02/02/2022   IR PARACENTESIS  02/16/2022   IR PARACENTESIS  02/28/2022   IR PARACENTESIS  03/15/2022   IR PARACENTESIS  03/18/2022   IR RADIOLOGIST EVAL & MGMT  04/28/2020   IR RADIOLOGIST EVAL & MGMT  07/15/2020   IR RADIOLOGIST EVAL &  MGMT  05/04/2021   IR TIPS  03/18/2022   IR US GUIDE VASC ACCESS RIGHT  03/18/2022   IR US GUIDE VASC ACCESS RIGHT  03/18/2022   RADIOLOGY WITH ANESTHESIA N/A 03/18/2022   Procedure: TIPS;  Surgeon: Suzette Battiest, MD;  Location: [place];  Service: Radiology;  Laterality: N/A;    Prior to Admission medications   Medication Sig Start Date End Date Taking? Authorizing Provider  albuterol (VENTOLIN HFA) 108 (90 Base) MCG/ACT inhaler Inhale 2 puffs into the lungs every 6 (six) hours as needed for wheezing or shortness of breath. 11/01/21  Yes [name], MD  busPIRone (BUSPAR) 5 MG tablet Take 5 mg by mouth 2 (two) times daily as needed (anxiety).   Yes [provider]  busPIRone (BUSPAR) 7.5 MG tablet Take 1 tablet (7.5 mg total) by mouth 2 (two) times daily as needed. for anxiety Patient taking differently: Take 7.5 mg by mouth 2 (two) times daily as needed (anxiety). 05/14/21  Yes [name], MD  carvedilol (COREG) 6.25 MG tablet Take 1 tablet (6.25 mg total)  by mouth 2 (two) times daily with a meal. 02/01/22  Yes [name], MD  ciprofloxacin (CIPRO) 250 MG tablet Take 1 tablet (250 mg total) by mouth daily. Patient taking differently: Take 250 mg by mouth daily. Continuous course. 02/09/22  Yes Pyrtle, Lajuan Lines, MD  ENTRESTO 24-26 MG Take 1 tablet by mouth 2 (two) times daily. 01/26/22  Yes [provider]  escitalopram (LEXAPRO) 5 MG tablet Take 1 tablet (5 mg total) by mouth daily. 05/14/21  Yes [name], MD  finasteride (PROSCAR) 5 MG tablet TAKE 1 TABLET EVERY DAY Patient taking differently: Take 5 mg by mouth daily. 01/10/22  Yes [name], MD  furosemide (LASIX) 20 MG tablet Take 20 mg by mouth daily. 01/04/22  Yes [provider]  gabapentin (NEURONTIN) 300 MG capsule TAKE 1 CAPSULE TWICE DAILY Patient taking differently: Take 300 mg by mouth 2 (two) times daily as needed (pain). 03/15/21  Yes [name], MD  lactulose (CHRONULAC) 10  GM/15ML solution Take 30 mLs (20 g total) by mouth 3 (three) times daily. 03/19/22 04/18/22 Yes Han, Aimee H, PA-C  metFORMIN (GLUCOPHAGE) 500 MG tablet Take 500 mg by mouth 3 (three) times daily.   Yes [provider]  oxyCODONE (ROXICODONE) 5 MG immediate release tablet Take 2.5 mg by mouth every 6 (six) hours as needed for severe pain.   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 03/10/22 April 16, 2022 Yes [name], MD  pantoprazole (PROTONIX) 40 MG tablet Take 1 tablet (40 mg total) by mouth 2 (two) times daily. 02/01/22  Yes [name], MD  rifaximin (XIFAXAN) 550 MG TABS tablet Take 1 tablet (550 mg total) by mouth 2 (two) times daily. 02/14/22  Yes Pyrtle, Lajuan Lines, MD  rosuvastatin (CRESTOR) 10 MG tablet Take 10 mg by mouth daily.   Yes [provider]  spironolactone (ALDACTONE) 50 MG tablet Take 50 mg by mouth daily.   Yes [provider]  sucralfate (CARAFATE) 1 g tablet Take 1 tablet (1 g total) by mouth 4 (four) times daily. Patient taking differently: Take 1 g by mouth 2 (two) times daily. 02/03/22 02/03/23 Yes Nandigam, Venia Minks, MD  tamsulosin (FLOMAX) 0.4 MG CAPS capsule TAKE 1 CAPSULE EVERY DAY Patient taking differently: Take 0.4 mg by mouth daily. 03/14/22  Yes [name], MD  rosuvastatin (CRESTOR) 5 MG tablet TAKE 1 TABLET EVERY DAY Patient not taking: Reported on 03/23/2022 11/16/21   [name], MD    Current Facility-Administered Medications  Medication Dose Route Frequency Provider Last Rate Last Admin   0.9 %  sodium chloride infusion  250 mL Intravenous Continuous Wyvonnia Dusky, MD 20 mL/hr at 03/23/22 0935 Infusion Verify at 03/23/22 0935   albuterol (PROVENTIL) (2.5 MG/3ML) 0.083% nebulizer solution 2.5 mg  2.5 mg Nebulization Q4H PRN [place] Crane M, PA-C       busPIRone  (BUSPAR) tablet 7.5 mg  7.5 mg Oral QHS [place] Crane M, PA-C       busPIRone (BUSPAR) tablet 7.5 mg  7.5 mg Oral Daily PRN [place] Crane M, PA-C       [START ON 03/25/2022] cefTRIAXone (ROCEPHIN) 2 g in sodium chloride 0.9 % 100 mL IVPB  2 g Intravenous Q24H Juanito Doom, MD       Chlorhexidine Gluconate Cloth 2 % PADS  6 each  6 each Topical Daily Collier Bullock, MD   6 each at 03/24/22 9379   docusate sodium (COLACE) capsule 100 mg  100 mg Oral BID PRN Lestine Mount, PA-C   100 mg at 03/24/22 0240   escitalopram (LEXAPRO) tablet 5 mg  5 mg Oral Daily [place] Crane M, PA-C   5 mg at 03/24/22 9735   heparin injection 5,000 Units  5,000 Units Subcutaneous Q8H Lestine Mount, [place]   5,000 Units at 03/24/22 3299   insulin aspart (novoLOG) injection 0-9 Units  0-9 Units Subcutaneous Q4H Lestine Mount, [place]   1 Units at 03/24/22 0810   lactulose (CHRONULAC) 10 GM/15ML solution 20 g  20 g Oral TID Lestine Mount, PA-C   20 g at 03/24/22 2426   Or   lactulose (CHRONULAC) enema 200 gm  300 mL Rectal TID Lestine Mount, PA-C       MEDLINE mouth rinse  15 mL Mouth Rinse BID Collier Bullock, MD   15 mL at 03/24/22 8341   norepinephrine (LEVOPHED) 75m in 2546m(0.016 mg/mL) premix infusion  0-10 mcg/min Intravenous Titrated McSimonne Maffucci, MD       ondansetron (ZOFRAN) injection 4 mg  4 mg Intravenous Q6H PRN SoAnders SimmondsMD   4 mg at 03/23/22 069622 pantoprazole (PROTONIX) EC tablet 40 mg  40 mg Oral Daily ReNev[place] Crane, PA-C   40 mg at 03/24/22 092979 polyethylene glycol (MIRALAX / GLYCOLAX) packet 17 g  17 g Oral Daily PRN Re[place] Crane, PA-C       rifaximin (XDoreene Nesttablet 550 mg  550 mg Oral BID ReNevada Crane, PA-C   550 mg at 03/24/22 098921 rosuvastatin (CRESTOR) tablet 5 mg  5 mg Oral Daily ReN[place]ada Crane, PA-C   5 mg at 03/24/22 091941 sodium chloride flush (NS) 0.9 % injection 10-40 mL  10-40 mL Intracatheter Q12H GoCollier Bullock MD   10 mL at 03/24/22 097408 sodium chloride flush (NS) 0.9 % injection 10-40 mL  10-40 mL Intracatheter PRN GoCollier BullockMD       sodium polystyrene (KAYEXALATE) 15 GM/60ML suspension 15 g  15 g Rectal Q6H OgFrederik PearMD   15 g at 03/24/22 0526   tamsulosin (FLOMAX) capsule 0.4 mg  0.4 mg Oral Daily ReNev[place] Crane, PA-C   0.4 mg at 03/24/22 091448  Allergies as of 03/22/2022   (No Known Allergies)    Family History  Problem Relation Age of Onset   Melanoma Mother    Stroke Father    CAD Brother        Premature disease   Colon polyps Neg Hx    Colon cancer Neg Hx    Esophageal cancer Neg Hx    Stomach cancer Neg Hx    Rectal cancer Neg Hx     Social History   Socioeconomic History   Marital status: Unknown    Spouse name: Not on file   Number of children: Not on file   Years of education: Not on file   Highest education level: Not on file  Occupational History   Occupation: retired    Comment: maintenence work  Tobacco Use   Smoking status: Former    Packs/day: 4.00    Years: 20.00    Total pack years: 80.00    Types: Cigarettes    Quit date: 09/18/1984  Years since quitting: 37.5   Smokeless tobacco: Never   Tobacco comments:    quit in 1980  Vaping Use   Vaping Use: Never used  Substance and Sexual Activity   Alcohol use: No    Comment: no drinking since 40 years plus   Drug use: No   Sexual activity: Yes  Other Topics Concern   Not on file  Social History Narrative   ** Merged History Encounter **       Married 46 years in 2015. 4 kids (2 sons) and 3 grandkids.    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            Social Determinants of [place] Strain: [place]  (09/10/2021)   Overall Financial Resource Strain (CARDIA)    Difficulty of Paying Living Expenses: Not hard at all  Food Insecurity: No Food Insecurity  (09/10/2021)   Hunger Vital Sign    Worried About Running Out of Food in the Last Year: Never true    [place] in the Last Year: Never true  Transportation Needs: No Transportation Needs (09/10/2021)   PRAPARE - Hydrologist (Medical): No    Lack of Transportation (Non-Medical): No  Physical Activity: Inactive (09/10/2021)   Exercise Vital Sign    Days of Exercise per Week: 0 days    Minutes of Exercise per Session: 0 min  Stress: No Stress Concern Present (09/10/2021)   [place]    Feeling of Stress : Only a little  Social Connections: Moderately Integrated (09/10/2021)   Social Connection and Isolation Panel [NHANES]    Frequency of Communication with Friends and Family: More than three times a week    Frequency of Social Gatherings with Friends and Family: More than three times a week    Attends Religious Services: More than 4 times per year    Active Member of [employer] or Organizations: No    Attends Archivist Meetings: Never    Marital Status: Married  Human resources officer Violence: Not At Risk (09/10/2021)   Humiliation, Afraid, Rape, and Kick questionnaire    Fear of Current or Ex-Partner: No    Emotionally Abused: No    Physically Abused: No    Sexually Abused: No    Review of Systems: Pertinent positive and negative review of systems were noted in the above HPI section.  All other review of systems was otherwise negative. [place] Kitchen  Physical Exam: Vital signs in last 24 hours: Temp:  [95.4 F (35.2 C)-98.1 F (36.7 C)] 97.6 F (36.4 C) (06/15 0800) Pulse Rate:  [69-119] 90 (06/15 0900) Resp:  [11-32] 12 (06/15 0900) BP: (82-136)/(35-116) 92/54 (06/15 0900) SpO2:  [84 %-100 %] 97 % (06/15 0900) Last BM Date : 03/24/22 General: Elderly white male, somnolent did not arouse to exam Head:  Normocephalic and atraumatic. Eyes:  Sclera clear, no icterus.   Conjunctiva  pink. Ears:  Normal auditory acuity. Nose:  No deformity, discharge,  or lesions. Mouth:  No deformity or lesions.   Neck:  Supple; no masses or thyromegaly. Lungs: Decreased breath sounds bilateral bases, few scattered rhonchi  heart:  Regular rate and rhythm; systolic murmur Abdomen:  Soft, protuberant, bowel sounds are present, no appreciable fluid wave no appreciable tenderness Rectal: Not  done Msk:  Symmetrical without gross deformities. . Pulses:  Normal pulses noted. Extremities:  Without clubbing or edema. Neurologic: Somnolent/sedated currently Skin:  Intact without significant lesions or rashes..   Intake/Output from previous day: 06/14 0701 - 06/15 0700 In: 1361.9 [P.O.:450; I.V.:674; IV Piggyback:237.9] Out: 250 [Urine:250] Intake/Output this shift: Total I/O In: 10 [I.V.:10] Out: 0   Lab Results: Recent Labs    03/23/22 0002 03/23/22 0417 03/24/22 0304  WBC 7.8 5.4 4.3  HGB 8.6* 8.5* 7.0*  HCT 26.2* 25.5* 21.5*  PLT 121* 88* 83*   BMET Recent Labs    03/23/22 1009 03/23/22 1545 03/24/22 0304  NA 133* 133* 136  K 5.8* 5.3* 5.4*  CL 105 104 104  CO2 20* 20* 21*  GLUCOSE 171* 168* 152*  BUN 56* 54* 56*  CREATININE 4.25* 4.02* 3.66*  CALCIUM 8.6* 8.5* 8.8*   LFT Recent Labs    03/23/22 0417  PROT 4.5*  ALBUMIN 2.2*  AST 360*  ALT 237*  ALKPHOS 349*  BILITOT 1.9*   PT/INR Recent Labs    03/23/22 0417 03/24/22 0304  LABPROT 17.8* 20.6*  INR 1.5* 1.8*   Hepatitis Panel No results for input(s): "HEPBSAG", "HCVAB", "HEPAIGM", "HEPBIGM" in the last 72 hours.   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 Apparently he fell at home on 03/19/2022, then had general failure to thrive, remained in bed, weak, not eating, and had significant decrease in urine output. After conversation with IR on 03/22/2022 brought to the emergency room, found to be hypotensive and hypoxic, in shock with multiorgan failure  He has had significant acute kidney injury, on chronic kidney disease stage III- urin Na <10 6/14- may have component HRS  Also some evidence of hepatic decompensation with INR now out to 1.8, elevated transaminases but no markedly elevated transaminases to suggest shock liver  Thus far sepsis work-up has been negative, blood cultures pending, peritoneal fluid Gram stain no WBCs and no organisms, Covering with Rocephin  Exact etiology of acute decompensation post TIPS is not clear at this time, understanding multiple potential complications including acute hepatic failure possible post TIPS .  #2 delirium/agitation-probably multifactorial-certainly encephalopathy can occur acutely post TIPS as well #3 HCC-very recent MRI shows treated lesions, and no new lesions #4 congestive heart failure #5 diabetes mellitus  PLAN: Nothing to add to care of CCM today, continue aggressive supportive measures Await renal ultrasound and ultrasound Doppler images.  IR involvement, TIPS may need interrogated  Continue high-dose lactulose-use enemas if unable to take orally, okay to place core track.  Renal Consult  GI will follow along Per notes it looks as if goals of care meetings are in progress with family and he is to be made a DNR we will continue current care short of intubation.  Latavious Bitter PA-C  03/24/2022, 10:24 AM

## 2022-03-25 ENCOUNTER — Inpatient Hospital Stay (HOSPITAL_COMMUNITY): Payer: Medicare HMO

## 2022-03-25 DIAGNOSIS — N179 Acute kidney failure, unspecified: Secondary | ICD-10-CM

## 2022-03-25 LAB — PROTIME-INR
INR: 1.7 — ABNORMAL HIGH (ref 0.8–1.2)
Prothrombin Time: 19.9 seconds — ABNORMAL HIGH (ref 11.4–15.2)

## 2022-03-25 LAB — CBC
HCT: 21.9 % — ABNORMAL LOW (ref 39.0–52.0)
Hemoglobin: 7.4 g/dL — ABNORMAL LOW (ref 13.0–17.0)
MCH: 31.8 pg (ref 26.0–34.0)
MCHC: 33.8 g/dL (ref 30.0–36.0)
MCV: 94 fL (ref 80.0–100.0)
Platelets: 77 10*3/uL — ABNORMAL LOW (ref 150–400)
RBC: 2.33 MIL/uL — ABNORMAL LOW (ref 4.22–5.81)
RDW: 19.9 % — ABNORMAL HIGH (ref 11.5–15.5)
WBC: 5 10*3/uL (ref 4.0–10.5)
nRBC: 0.4 % — ABNORMAL HIGH (ref 0.0–0.2)

## 2022-03-25 LAB — PATHOLOGIST SMEAR REVIEW

## 2022-03-25 LAB — COMPREHENSIVE METABOLIC PANEL
ALT: 139 U/L — ABNORMAL HIGH (ref 0–44)
AST: 141 U/L — ABNORMAL HIGH (ref 15–41)
Albumin: 2.8 g/dL — ABNORMAL LOW (ref 3.5–5.0)
Alkaline Phosphatase: 327 U/L — ABNORMAL HIGH (ref 38–126)
Anion gap: 10 (ref 5–15)
BUN: 57 mg/dL — ABNORMAL HIGH (ref 8–23)
CO2: 19 mmol/L — ABNORMAL LOW (ref 22–32)
Calcium: 8.7 mg/dL — ABNORMAL LOW (ref 8.9–10.3)
Chloride: 109 mmol/L (ref 98–111)
Creatinine, Ser: 3.45 mg/dL — ABNORMAL HIGH (ref 0.61–1.24)
GFR, Estimated: 17 mL/min — ABNORMAL LOW (ref >60)
Glucose, Bld: 106 mg/dL — ABNORMAL HIGH (ref 70–99)
Potassium: 4.8 mmol/L (ref 3.5–5.1)
Sodium: 138 mmol/L (ref 135–145)
Total Bilirubin: 2.1 mg/dL — ABNORMAL HIGH (ref 0.3–1.2)
Total Protein: 4.9 g/dL — ABNORMAL LOW (ref 6.5–8.1)

## 2022-03-25 LAB — GLUCOSE, CAPILLARY
Glucose-Capillary: 107 mg/dL — ABNORMAL HIGH (ref 70–99)
Glucose-Capillary: 125 mg/dL — ABNORMAL HIGH (ref 70–99)
Glucose-Capillary: 141 mg/dL — ABNORMAL HIGH (ref 70–99)
Glucose-Capillary: 91 mg/dL (ref 70–99)
Glucose-Capillary: 96 mg/dL (ref 70–99)
Glucose-Capillary: 97 mg/dL (ref 70–99)

## 2022-03-25 LAB — PHOSPHORUS
Phosphorus: 5.3 mg/dL — ABNORMAL HIGH (ref 2.5–4.6)
Phosphorus: 4.8 mg/dL — ABNORMAL HIGH (ref 2.5–4.6)

## 2022-03-25 LAB — AMMONIA: Ammonia: 52 umol/L — ABNORMAL HIGH (ref 9–35)

## 2022-03-25 LAB — MAGNESIUM
Magnesium: 2 mg/dL (ref 1.7–2.4)
Magnesium: 2 mg/dL (ref 1.7–2.4)

## 2022-03-25 MED ORDER — ALBUMIN HUMAN 5 % IV SOLN
12.5000 g | Freq: Once | INTRAVENOUS | Status: AC
  Administered 2022-03-25: 12.5 g via INTRAVENOUS
  Filled 2022-03-25: qty 250

## 2022-03-25 MED ORDER — PROSOURCE TF PO LIQD
45.0000 mL | Freq: Three times a day (TID) | ORAL | Status: DC
  Administered 2022-03-25 – 2022-04-04 (×30): 45 mL
  Filled 2022-03-25 (×31): qty 45

## 2022-03-25 MED ORDER — FUROSEMIDE 10 MG/ML IJ SOLN
120.0000 mg | Freq: Once | INTRAVENOUS | Status: AC
  Administered 2022-03-25: 120 mg via INTRAVENOUS
  Filled 2022-03-25: qty 10

## 2022-03-25 MED ORDER — OXYCODONE HCL 5 MG PO TABS
5.0000 mg | ORAL_TABLET | Freq: Four times a day (QID) | ORAL | Status: DC | PRN
  Administered 2022-03-25 – 2022-04-02 (×18): 5 mg
  Filled 2022-03-25 (×19): qty 1

## 2022-03-25 MED ORDER — FENTANYL CITRATE (PF) 100 MCG/2ML IJ SOLN: 12.5000 ug | Freq: Once | INTRAMUSCULAR | Status: DC | PRN

## 2022-03-25 MED ORDER — OSMOLITE 1.5 CAL PO LIQD
1000.0000 mL | ORAL | Status: DC
  Administered 2022-03-25 – 2022-03-30 (×6): 1000 mL
  Filled 2022-03-25 (×3): qty 1000

## 2022-03-25 MED ORDER — LACTULOSE 10 GM/15ML PO SOLN
30.0000 g | Freq: Three times a day (TID) | ORAL | Status: DC
Start: 2022-03-25 — End: 2022-03-28
  Administered 2022-03-25 – 2022-03-28 (×10): 30 g
  Filled 2022-03-25 (×10): qty 45

## 2022-03-25 NOTE — Progress Notes (Signed)
Initial Nutrition Assessment  DOCUMENTATION CODES:   Non-severe (moderate) malnutrition in context of chronic illness  INTERVENTION:   Initiate tube feeds via Cortrak: - Start Osmolite 1.5 @ 20 ml/hr and advance by 10 ml q 8 hours to goal rate of 50 ml/hr (1200 ml/day) - ProSource TF 45 ml TID  Tube feeding regimen at goal rate provides 1920 kcal, 108 grams of protein, and 914 ml of H2O.   Monitor magnesium, potassium, and phosphorus BID for at least 3 days, MD to replete as needed, as pt is at risk for refeeding syndrome given malnutrition, poor PO intake x 6 days.  NUTRITION DIAGNOSIS:   Moderate Malnutrition related to chronic illness (COPD, CHF, cirrhosis) as evidenced by moderate fat depletion, severe muscle depletion.  GOAL:   Patient will meet greater than or equal to 90% of their needs  MONITOR:   Diet advancement, Labs, Weight trends, TF tolerance, Skin, I & O's  REASON FOR ASSESSMENT:   Consult Enteral/tube feeding initiation and management  ASSESSMENT:   81 year old male who presented to the ED on 6/13 after a fall. 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 Pt admitted with shock, AKI.  06/14 - s/p paracentesis with 20 ml fluid removed 06/15 - NG tube placed for administration of meds (tip gastric) 06/16 - NG tube exchanged for Cortrak (tip gastric)  Discussed pt with RN and during ICU rounds. Consult received for enteral nutrition initiation and management. NG tube dislodged/pulled out by pt. Cortrak placed this morning, tip gastric per x-ray.  Pt remains confused and agitated at times. Per CCM, renal function was improving but now flattening. Pt remains anuric. Plan for lasix challenge.  Unable to obtain diet and weight history at this time. Per notes, pt with very minimal PO intake for 3 days PTA and has been here for 3 days with poor PO intake. Weight  trending up this admission related to positive fluid balance. Suspect dry weight is around 77-79 kg. Overall, weight has trended down over the last 10 months. Pt weighed 96.2 kg on 05/13/21 and weighed 79 kg on admission. This is a 8.7 kg weight loss (9%) which is not clinically significant for timeframe but is concerning given pt meets criteria for malnutrition.  RD to start tube feeds at trickle rate and slowly advance to goal. Pt is at refeeding risk.  Admit weight: 79 kg Current weight: 87.5 kg  Medications reviewed and include: SSI q 4 hours, lactulose 30 grams TID, IV protonix, IV abx, IV lasix 120 mg once  Labs reviewed: BUN 57, creatinine 3.45, phosphorus 5.3, elevated LFTs, hemoglobin 7.4, platelets 77 CBG's: 91-104 x 24 hours  I/O's: +3.7 L since admit  NUTRITION - FOCUSED PHYSICAL EXAM:  Flowsheet Row Most Recent Value  Orbital Region Moderate depletion  Upper Arm Region Mild depletion  Thoracic and Lumbar Region Moderate depletion  Buccal Region Moderate depletion  Temple Region Moderate depletion  Clavicle Bone Region Severe depletion  Clavicle and Acromion Bone Region Severe depletion  Scapular Bone Region Moderate depletion  Dorsal Hand Moderate depletion  Patellar Region Moderate depletion  Anterior Thigh Region Severe depletion  Posterior Calf Region Moderate depletion  Edema (RD Assessment) Moderate  [BUE]  Hair Reviewed  Eyes Reviewed  Mouth Reviewed  Skin Reviewed  Nails Reviewed       Diet Order:   Diet Order  Diet NPO time specified  Diet effective now                   EDUCATION NEEDS:   Not appropriate for education at this time  Skin:  Skin Assessment: Skin Integrity Issues: Stage I: sacrum Other: skin tear to R arm and scrotum  Last BM:  03/25/22 multiple type 6  Height:   Ht Readings from Last 1 Encounters:  03/22/22 '5\' 10"'$  (1.778 m)    Weight:   Wt Readings from Last 1 Encounters:  03/25/22 87.5 kg    BMI:   Body mass index is 27.68 kg/m.  Estimated Nutritional Needs:   Kcal:  1800-2000  Protein:  100-115 grams  Fluid:  1.8 L    Gustavus Bryant, MS, RD, LDN Inpatient Clinical Dietitian Please see AMiON for contact information.

## 2022-03-25 NOTE — Progress Notes (Signed)
NAME:  [name], MRN:  [id number], DOB:  Jun 22, 1941, LOS: 3 ADMISSION DATE:  03/22/2022 CONSULTATION DATE:  03/23/2022 REFERRING MD:  Langston Masker - EDP ([facility]) CHIEF COMPLAINT:  Hypotension, FTT  History of Present Illness:  81 year old man who presented to [facility] 6/13 for weakness, decreased mobility and poor PO intake s/p fall 3 days PTA. PMHx significant for HTN, HLD, mild AS, HFrEF (EF as low as 20-25%, recovered with Echo 02/2022 EF 55-60%, G1DD), COPD, prior tobacco use, T2DM, NASH cirrhosis with portal hypertension as evidenced by EV, PHG, ascites requiring frequent paracentesis (c/b SBP) and HCC (s/p radiation), gastric/duodenal ulcers and diverticulosis, IDA. Recently underwent TIPS placement with IR 6/9 with gradient reduction from 13 to 2. Monitored x 24H and discharged home 6/10.  Patient reportedly had a fall 6/10 shortly after arriving home, sustaining a L arm injury. Since injury, patient's mobility has been decreased and appetite has been poor; additionally reporting decreased UOP and constipation. Patient was advised by IR to present to ED for further evaluation. Presented to [facility] ED 6/13 for weakness, decreased mobility and poor PO intake s/p fall. Vitals on ED arrival were notable for BP 89/35. Labs notable for new transaminitis with AST 545/ALT 293, Cr 4.78 (baseline 1.3-1.8), INR 1.5, WBC/LA WNL. Hgb 8.7, near baseline. CXR with small L pleural effusion. CT C-spine and L arm/shoulder imaging negative. Fluid resuscitation was initiated (3L) with slight improvement in SBPs to 80s. RIJ CVC placed and Levophed was initiated for additional BP support.  PCCM consulted for ICU admission/transfer to [facility]  Pertinent Medical History:   Past Medical History:  Diagnosis Date   Acute renal failure ([place])    Aortic atherosclerosis ([place])    Benign prostatic hypertrophy    Blood transfusion without reported diagnosis    CHF (congestive heart failure) (HCC)    a. EF 20-25% by echo in 11/2019   COPD  (chronic obstructive pulmonary disease) ([place]) 12/15/2009   FEV1 2.30 (70%) ratio 63 no better with B2 and DLCO 18/6 (73%) corrects to 106%   Degenerative joint disease    Depression    Diverticulosis    Duodenal varices    Esophageal varices (HCC)    Essential hypertension    ACE inhibitor cough   Fatty liver    Gastric ulcer    Hepatic cirrhosis (HCC)    Hepatocellular carcinoma (HCC)    Hyperlipidemia    IDA (iron deficiency anemia)    Internal hemorrhoids    Liver cancer ([place])    Low back pain    Otitis externa 07/30/2012   Pneumonia    Portal hypertensive gastropathy (HCC)    SBP (spontaneous bacterial peritonitis) ([place])    Sepsis (HCC)    Splenomegaly    Thrombocytopenia (HCC)    Type 2 diabetes mellitus ([place])    no meds   Ulcer of foot ([place])    Right foot   Significant Hospital Events: Including procedures, antibiotic start and stop dates in addition to other pertinent events   6/9 - TIPS placement at [facility] with IR, tolerated procedure well. Gradient reduced from 13 to 2. 6/10 - Discharged home. Sustained fall at home and L arm injury (imaging negative for fx/dislocation). 6/13 - Presented to [facility] for weakness, poor PO intake, FTT post-fall. Decreased UOP/constipation. Lab findings notable for new transaminitis, acute renal failure. Hypotensive requiring fluid resuscitation and pressor initiation. Transfer to [facility] for ICU admission/further management.  Interim History / Subjective:  Confused this morning. Agitated.  Pulled NGT out  this morning Renal function was improving but now flattening Still anuric  Objective:  Blood pressure 111/60, pulse 99, temperature 97.6 F (36.4 C), temperature source Oral, resp. rate 20, height '5\' 10"'$  (1.778 m), weight 87.5 kg, SpO2 100 %.        Intake/Output Summary (Last 24 hours) at 03/25/2022 0931 Last data filed at 03/25/2022 0200 Gross per 24 hour  Intake 100.07 ml  Output 0 ml  Net 100.07 ml    Filed Weights   03/22/22  1235 03/23/22 0458 03/25/22 0500  Weight: 79 kg 84.4 kg 87.5 kg   Physical Examination:  General: Frail elderly gentleman HEENT: [place]/AT, PERRL, no JVD Neuro: Alternating periods of somnolence and delirium/agitation.  CV: RRR, 3/6 SEM PULM: Clear bilateral breath sounds GI: Soft, NT, ND Extremities: No acute deformity or ROM limitation Skin: Grossly intact.   Resolved Hospital Problem List:    Assessment & Plan:   Shock: etiology uncertain. Ongoing issues with ascites so cannot rule out sepsis 2/2 bacterial peritonitis. Small ascites on ultrasound. S/p 3 L in ED.  - Pressors off - Echo unremarkable - F/u Cx data including peritoneal fluid - so far negative - Ceftriaxone with stop date for 5 days for r/o SBP.   Hepatocellular carcinoma s/p SBRT Cirrhosis Portal hypertension Esophageal varices, PHG Ascites, s/p TIPS 6/9: Tolerated procedure well with gradient reduction from 13 to 2. - Continue Rifaximin, lactulose.  - Cortrak today for enteral administration - Will give lasix challenge today - Surveillance monitoring for [facility] per oncology outpatient, recent MRI reassuring.  - consider TIPS interrogation via IR  Acute renal failure, likely 2/2 ATN due to hypotension and dehydration in the setting of CKD.  Hyperkalemia - Remains anuric - Replete electrolytes as indicated - Lasix challenge today - Would not want aggressive measures such as HD per family.  Acute metabolic encephalopathy - Ammonia improving ? Pain driven - Low dose fentanyl trial  - PRN oxycodone once cortrak placed.   HFrEF (EF improved to 55-60%, previously ~20%) Mild AS Hypertension Hyperlipidemia Most recent Echo 02/2022 with EF 55-60%, G1DD. - Hold home Entresto in the setting of severe AKI/ARF - Hold home Coreg in the setting of hypotension - Can consider restarting coreg. Will see how he does with lasix.  - Holding statin - Cardiac monitoring - Monitor I&Os, goal euvolemia  COPD Prior tobacco  use - Continue supplemental O2 support - Wean O2 for sat > 90% - Bronchodilators PRN - Pulmonary hygiene, IS - Would not want intubation per family  IDA Thrombocytopenia Elevated INR in the setting of liver dysfunction - Trend CBC - Transfuse for Hgb < 7.0 or hemodynamically significant bleeding - Transfuse for Plt < 20K  T2DM - CBGs Q4H while PO intake remains poor - Goal 140s-180s - SSI  Gastric and duodenal ulcers Diverticulosis - Continue PPI  Depression, anxiety - Holding home home Lexapro, Buspar  FTT - supportive care  Best Practice: (right click and "Reselect all SmartList Selections" daily)   Diet/type: NPO - TF start DVT prophylaxis: prophylactic heparin s GI prophylaxis: PPI Lines: Central line - remove Foley:  N/A Code Status:  DNR: discussed with daughter [name]  6/15. Clear he would not want aggressive measures which are likely to leave him in a dependent state. Fiercly independent man. Would not accept living in SNF.    Critical care time: 38 minutes    Georgann Housekeeper, AGACNP-BC [facility]  See Amion for personal pager PCCM on call pager [phone] until 7pm. Please call Elink 7p-7a. [phone]  03/25/2022 9:31 AM

## 2022-03-25 NOTE — Progress Notes (Signed)
Referring Physician(s): PCCM  Supervising Physician: Ruthann Cancer  Patient Status:  [facility] - In-pt  Chief Complaint:  Shock with multi-organ failure including elevated transaminitis and AKI. Recent TIPS 03/18/22.   Subjective:  Patient sitting in bed, awake, NAD. Speaking in complete sentences.  Reports hurting "all over".  Jovial. Family at bedside.  Allergies: Patient has no known allergies.  Medications: Prior to Admission medications   Medication Sig Start Date End Date Taking? Authorizing Provider  albuterol (VENTOLIN HFA) 108 (90 Base) MCG/ACT inhaler Inhale 2 puffs into the lungs every 6 (six) hours as needed for wheezing or shortness of breath. 11/01/21  Yes [name], MD  busPIRone (BUSPAR) 5 MG tablet Take 5 mg by mouth 2 (two) times daily as needed (anxiety).   Yes [provider]  busPIRone (BUSPAR) 7.5 MG tablet Take 1 tablet (7.5 mg total) by mouth 2 (two) times daily as needed. for anxiety Patient taking differently: Take 7.5 mg by mouth 2 (two) times daily as needed (anxiety). 05/14/21  Yes [name], MD  carvedilol (COREG) 6.25 MG tablet Take 1 tablet (6.25 mg total) by mouth 2 (two) times daily with a meal. 02/01/22  Yes [name], MD  ciprofloxacin (CIPRO) 250 MG tablet Take 1 tablet (250 mg total) by mouth daily. Patient taking differently: Take 250 mg by mouth daily. Continuous course. 02/09/22  Yes Pyrtle, Lajuan Lines, MD  ENTRESTO 24-26 MG Take 1 tablet by mouth 2 (two) times daily. 01/26/22  Yes [provider]  escitalopram (LEXAPRO) 5 MG tablet Take 1 tablet (5 mg total) by mouth daily. 05/14/21  Yes [name], MD  finasteride (PROSCAR) 5 MG tablet TAKE 1 TABLET EVERY DAY Patient taking differently: Take 5 mg by mouth daily. 01/10/22  Yes [name], MD  furosemide (LASIX) 20 MG tablet Take 20 mg by mouth daily. 01/04/22  Yes [provider]  gabapentin (NEURONTIN) 300 MG capsule TAKE 1 CAPSULE TWICE  DAILY Patient taking differently: Take 300 mg by mouth 2 (two) times daily as needed (pain). 03/15/21  Yes [name], MD  lactulose (CHRONULAC) 10 GM/15ML solution Take 30 mLs (20 g total) by mouth 3 (three) times daily. 03/19/22 04/18/22 Yes Han, Aimee H, PA-C  metFORMIN (GLUCOPHAGE) 500 MG tablet Take 500 mg by mouth 3 (three) times daily.   Yes [provider]  oxyCODONE (ROXICODONE) 5 MG immediate release tablet Take 2.5 mg by mouth every 6 (six) hours as needed for severe pain.   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 03/10/22 04-19-2022 Yes [name], MD  pantoprazole (PROTONIX) 40 MG tablet Take 1 tablet (40 mg total) by mouth 2 (two) times daily. 02/01/22  Yes [name], MD  rifaximin (XIFAXAN) 550 MG TABS tablet Take 1 tablet (550 mg total) by mouth 2 (two) times daily. 02/14/22  Yes Pyrtle, Lajuan Lines, MD  rosuvastatin (CRESTOR) 10 MG tablet Take 10 mg by mouth daily.   Yes [provider]  spironolactone (ALDACTONE) 50 MG tablet Take 50 mg by mouth daily.   Yes [provider]  sucralfate (CARAFATE) 1 g tablet Take 1 tablet (1 g total) by mouth 4 (four) times daily. Patient taking differently: Take 1 g by mouth 2 (two) times daily. 02/03/22 02/03/23 Yes Nandigam, Venia Minks, MD  tamsulosin (FLOMAX) 0.4 MG  CAPS capsule TAKE 1 CAPSULE EVERY DAY Patient taking differently: Take 0.4 mg by mouth daily. 03/14/22  Yes [name], MD  rosuvastatin (CRESTOR) 5 MG tablet TAKE 1 TABLET EVERY DAY Patient not taking: Reported on 03/23/2022 11/16/21   [name], MD     Vital Signs: BP 116/62   Pulse 97   Temp (!) 97.3 F (36.3 C) (Oral)   Resp 19   Ht '5\' 10"'$  (1.778 m)   Wt 192 lb 14.4 oz (87.5 kg)   SpO2 98%   BMI 27.68 kg/m   Physical Exam Vitals and nursing note reviewed.  Constitutional:       General: He is not in acute distress.    Comments: Speech weak  HENT:     Head: Normocephalic.  Eyes:     Extraocular Movements: Extraocular movements intact.  Cardiovascular:     Rate and Rhythm: Normal rate.     Pulses: Normal pulses.  Pulmonary:     Effort: Pulmonary effort is normal.  Abdominal:     General: Abdomen is flat.     Palpations: Abdomen is soft.  Musculoskeletal:     Cervical back: Neck supple.  Skin:    General: Skin is warm and dry.     Coloration: Skin is not jaundiced.  Neurological:     General: No focal deficit present.     Imaging: DG Abd Portable 1V  Result Date: 03/25/2022 CLINICAL DATA:  Provided history: Feeding tube insertion. EXAM: PORTABLE ABDOMEN - 1 VIEW COMPARISON:  Abdominal radiograph 03/24/2022. FINDINGS: Problem-oriented examination for enteric tube position. An enteric tube passes below the level left hemidiaphragm with tip projecting in the expected location of the gastric body. Redemonstrated small amount of enteric contrast within the left upper quadrant of the abdomen. Mild air distension of small and large bowel within the imaged abdomen. Prior TIPS. Degenerative changes of the spine. IMPRESSION: Enteric tube present with tip projecting in expected location of the gastric body. Electronically Signed   By: Kellie Simmering D.O.   On: 03/25/2022 10:54   US LIVER DOPPLER  Result Date: 03/25/2022 CLINICAL DATA:  Status post TIPS on 03/18/2022. Current admission for encephalopathy, possible sepsis and acute kidney injury. Assessment of TIPS and portal vein patency. EXAM: DUPLEX ULTRASOUND OF LIVER AND TIPS SHUNT TECHNIQUE: Color and duplex Doppler ultrasound was performed to evaluate the hepatic in-flow and out-flow vessels. COMPARISON:  None Available. FINDINGS: Portal Vein Velocities Main:  52 cm/sec Right:  84 cm/sec Left:  18 cm/sec TIPS Stent Velocities Proximal:  110 cm/sec Mid:  190 Distal:  103 cm/sec IVC: Present and patent with normal  respiratory phasicity. Hepatic Vein Velocities Right:  50 cm/sec Mid:  48 cm/sec Left:  32 cm/sec Splenic Vein: 36 Superior Mesenteric Vein: None visualized. Hepatic Artery: 142 Ascities: Moderate ascites present. Varices: None visualized. Portal vein waveforms are normal and flow is towards the liver. The TIPS shunt is normally patent. Hepatic vein waveforms are normal. Normal spleen size. IMPRESSION: Normal patency of TIPS shunt and portal vein. Electronically Signed   By: Aletta Edouard M.D.   On: 03/25/2022 08:14   US RENAL  Result Date: 03/24/2022 CLINICAL DATA:  Status post tips procedure. EXAM: RENAL / URINARY TRACT ULTRASOUND COMPLETE COMPARISON:  MRI 03/16/2022 FINDINGS: Right Kidney: Renal measurements: 10.3 x 5.2 x 5.1 cm = volume: 144.4 mL. Mild age related renal cortical thinning but normal echogenicity and no focal lesions or hydronephrosis. Left Kidney: Renal measurements: 9.3 x 5.5 x  5.1 cm = volume: 135.6 mL. Mild age related renal cortical thinning but normal echogenicity. No focal lesions or hydronephrosis. Bladder: Appears normal for degree of bladder distention. Other: Moderate ascites. IMPRESSION: No renal lesions or hydronephrosis. Unremarkable sonographic appearance of the bladder. Electronically Signed   By: Marijo Sanes M.D.   On: 03/24/2022 19:47   DG Abd 1 View  Result Date: 03/24/2022 CLINICAL DATA:  Encounter for tube placement EXAM: ABDOMEN - 1 VIEW COMPARISON:  02/04/2022 FINDINGS: Limited radiograph of the lower chest and upper abdomen was obtained for the purposes of enteric tube localization. Enteric tube is seen coursing below the diaphragm with distal tip and side port terminating within the expected location of the gastric body. IMPRESSION: Enteric tube within the gastric body. Electronically Signed   By: Davina Poke D.O.   On: 03/24/2022 13:48   ECHOCARDIOGRAM LIMITED  Result Date: 03/23/2022    ECHOCARDIOGRAM LIMITED REPORT   Patient Name:   [name]  Date of Exam: 03/23/2022 Medical Rec #:  [id number]      Height:       70.0 in Accession #:    [id number]     Weight:       186.1 lb Date of Birth:  04/25/1941      BSA:          2.024 m Patient Age:    70 years       BP:           107/49 mmHg Patient Gender: M              HR:           74 bpm. Exam Location:  Inpatient Procedure: 2D Echo, Cardiac Doppler, Color Doppler and Limited Echo Indications:    Other cardiac sounds  History:        Patient has prior history of Echocardiogram examinations. COPD;                 Risk Factors:Hypertension and Diabetes.  Sonographer:    Jyl Heinz Referring Phys: [id number] [place]  1. Left ventricular ejection fraction, by estimation, is 65 to 70%. The left ventricle has normal function. The left ventricle has no regional wall motion abnormalities. Left ventricular diastolic parameters were normal.  2. Right ventricular systolic function is normal. The right ventricular size is normal.  3. The mitral valve is normal in structure. Mild mitral valve regurgitation.  4. Difficult acoustic windows. AV is not well seen. Peak and mean gradients through the valve are 31 and 18 mm HG respectivley. 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 Compared to echo report from May 2023, mean gradient is increased (13 to 18 mm Hg), and dimensionless index is lower (0.43 to 0.34).  5. Aortic dilatation noted. There is mild dilatation of the aortic root, measuring 39 mm. FINDINGS  Left Ventricle: Left ventricular ejection fraction, by estimation, is 65 to 70%. The left ventricle has normal function. The left ventricle has no regional wall motion abnormalities. The left ventricular internal cavity size was normal in size. There is  no left ventricular hypertrophy. Left ventricular diastolic parameters were normal. Right Ventricle: The right ventricular size is normal. Right vetricular wall thickness was not assessed. Right ventricular systolic  function is normal. Pericardium: There is no evidence of pericardial effusion. Mitral Valve: The mitral valve is normal in structure. Mild mitral valve regurgitation. Tricuspid Valve: The tricuspid valve is grossly  normal. Tricuspid valve regurgitation is trivial. Aortic Valve: Difficult acoustic windows. AV is not well seen. Peak and mean gradients through the valve are 31 and 18 mm HG respectivley. 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 Compared to echo report from May 2023, mean gradient is increased (13 to 18 mm Hg), and dimensionless index is lower (0.43 to 0.34). Aortic valve mean gradient measures 17.3 mmHg. Aortic valve peak gradient measures 29.8 mmHg. Aortic valve area, by VTI measures 1.40 cm. Pulmonic Valve: The pulmonic valve was normal in structure. Pulmonic valve regurgitation is not visualized. Aorta: Aortic dilatation noted. There is mild dilatation of the aortic root, measuring 39 mm. LEFT VENTRICLE PLAX 2D LVIDd:         4.40 cm      Diastology LVIDs:         2.80 cm      LV e' medial:    9.48 cm/s LV PW:         1.10 cm      LV E/e' medial:  11.5 LV IVS:        1.00 cm      LV e' lateral:   8.55 cm/s LVOT diam:     2.28 cm      LV E/e' lateral: 12.7 LV SV:         83 LV SV Index:   41 LVOT Area:     4.09 cm  LV Volumes (MOD) LV vol d, MOD A2C: 100.0 ml LV vol d, MOD A4C: 128.0 ml LV vol s, MOD A2C: 32.6 ml LV vol s, MOD A4C: 45.9 ml LV SV MOD A2C:     67.4 ml LV SV MOD A4C:     128.0 ml LV SV MOD BP:      76.0 ml RIGHT VENTRICLE             IVC RV S prime:     13.50 cm/s  IVC diam: 2.30 cm TAPSE (M-mode): 2.3 cm LEFT ATRIUM         Index LA diam:    3.30 cm 1.63 cm/m  AORTIC VALVE AV Area (Vmax):    1.38 cm AV Area (Vmean):   1.35 cm AV Area (VTI):     1.40 cm AV Vmax:           273.00 cm/s AV Vmean:          202.000 cm/s AV VTI:            0.594 m AV Peak Grad:      29.8 mmHg AV Mean Grad:      17.3 mmHg LVOT Vmax:         92.10 cm/s LVOT Vmean:         66.750 cm/s LVOT VTI:          0.204 m LVOT/AV VTI ratio: 0.34  AORTA Ao Root diam: 3.90 cm Ao Asc diam:  3.40 cm MITRAL VALVE                TRICUSPID VALVE MV Area (PHT): 3.56 cm     TR Peak grad:   29.8 mmHg MV Decel Time: 213 msec     TR Vmax:        273.00 cm/s MV E velocity: 109.00 cm/s MV A velocity: 95.90 cm/s   SHUNTS MV E/A ratio:  1.14         Systemic VTI:  0.20 m  Systemic Diam: 2.28 cm Dorris Carnes MD Electronically signed by Dorris Carnes MD Signature Date/Time: 03/23/2022/6:21:00 PM    Final    US Abdomen Complete  Result Date: 03/23/2022 CLINICAL DATA:  Acute encephalopathy. EXAM: ABDOMEN ULTRASOUND COMPLETE COMPARISON:  CT of the abdomen pelvis dated 02/04/2022. FINDINGS: Evaluation is very limited due to portable technique. Gallbladder: Not visualized. Common bile duct: Diameter: 13 mm. There is dilatation of the common bile duct. Liver: Fatty liver with changes of cirrhosis. Portal vein is patent on color Doppler imaging with normal direction of blood flow towards the liver. IVC: Suboptimally visualized. Pancreas: Not well seen. Spleen: Size and appearance within normal limits. Right Kidney: Length: 10.7 cm. Mild increased echogenicity. No hydronephrosis or shadowing stone. Left Kidney: Length: 10.2 cm. Mild increased echogenicity. No hydronephrosis or shadowing stone Abdominal aorta: No aneurysm visualized. Other findings: Small ascites. Small bilateral pleural effusions noted. IMPRESSION: 1. Cirrhosis and small ascites. 2. Patent main portal vein with hepatopetal flow. 3. Small ascites and bilateral pleural effusions. Electronically Signed   By: Anner Crete M.D.   On: 03/23/2022 03:44   DG Chest Port 1V same Day  Result Date: 03/22/2022 CLINICAL DATA:  Central line placement. EXAM: PORTABLE CHEST 1 VIEW COMPARISON:  Chest radiograph dated 03/22/2022. FINDINGS: Interval placement of a right IJ central venous line with tip projecting over the right atrium  close to the cavoatrial junction. There is shallow inspiration. Probable small left pleural effusion and left lung base atelectasis. Pneumonia is not excluded. No pneumothorax. There is cardiomegaly mild vascular congestion. Atherosclerotic calcification of the aorta. Osteopenia with degenerative changes of the spine. No acute osseous pathology. IMPRESSION: Interval placement of a right IJ central venous line with tip projecting over the right atrium close to the cavoatrial junction. No pneumothorax. Electronically Signed   By: Anner Crete M.D.   On: 03/22/2022 19:48   DG Chest Portable 1 View  Result Date: 03/22/2022 CLINICAL DATA:  Generalized weakness. History of congestive heart failure. EXAM: PORTABLE CHEST 1 VIEW COMPARISON:  11/02/2021 FINDINGS: Heart size upper limits of normal. Chronic aortic atherosclerosis. Probable small effusions, at least on the left. Minimal basilar atelectasis on the left. Upper lungs are clear. No widespread pulmonary edema. IMPRESSION: Aortic atherosclerosis. Probable pleural effusion, at least on the left, with mild left base volume loss. Electronically Signed   By: Nelson Chimes M.D.   On: 03/22/2022 14:47   CT Cervical Spine Wo Contrast  Result Date: 03/22/2022 CLINICAL DATA:  Neck pain extending into the left arm after falling 3 days ago. EXAM: CT CERVICAL SPINE WITHOUT CONTRAST TECHNIQUE: Multidetector CT imaging of the cervical spine was performed without intravenous contrast. Multiplanar CT image reconstructions were also generated. RADIATION DOSE REDUCTION: This exam was performed according to the departmental dose-optimization program which includes automated exposure control, adjustment of the mA and/or kV according to patient size and/or use of iterative reconstruction technique. COMPARISON:  None Available. FINDINGS: Despite efforts by the technologist and patient, mild motion artifact is present on today's exam and could not be eliminated. This reduces exam  sensitivity and specificity. Alignment: 6 mm of anterolisthesis at C4-5.  Otherwise anatomic. Skull base and vertebrae: No evidence of acute fracture or traumatic subluxation. There is multilevel spondylosis with disc space narrowing, uncinate spurring and facet hypertrophy. Facet hypertrophy at C4-5 is felt to account for the anterolisthesis noted above. Soft tissues and spinal canal: No prevertebral fluid or swelling. No visible canal hematoma. Bilateral carotid atherosclerosis. Disc levels: Multilevel spondylosis with  disc space narrowing, uncinate spurring and bilateral facet hypertrophy. At C3-4, there is moderate foraminal narrowing, worse on the left. At C4-5, there is mild to moderate spinal stenosis and severe foraminal narrowing bilaterally due to the anterolisthesis. Severe foraminal narrowing is also present bilaterally at C5-6 due to spondylosis. Asymmetric severe left foraminal narrowing at C6-7 due to spondylosis. No acute disc space findings identified. Upper chest: Unremarkable. Other: None. IMPRESSION: 1. No evidence of acute cervical spine fracture, traumatic subluxation or static signs of instability. Detail limited by motion artifact. 2. 6 mm of anterolisthesis due to facet disease at C4-5. 3. Multilevel spondylosis with resulting osseous foraminal narrowing as detailed above. Electronically Signed   By: Richardean Sale M.D.   On: 03/22/2022 13:36   DG Shoulder Left  Result Date: 03/22/2022 CLINICAL DATA:  Fall EXAM: LEFT SHOULDER - 2+ VIEW COMPARISON:  None Available. FINDINGS: There is no evidence of fracture or dislocation. There is no evidence of arthropathy or other focal bone abnormality. Soft tissues are unremarkable. IMPRESSION: Negative. Electronically Signed   By: Macy Mis M.D.   On: 03/22/2022 13:28   DG Elbow Complete Left  Result Date: 03/22/2022 CLINICAL DATA:  Trauma, fall, pain EXAM: LEFT ELBOW - COMPLETE 3+ VIEW COMPARISON:  None Available. FINDINGS: No recent  fracture or dislocation is seen. There is no significant displacement of posterior fat pad. Small bony spurs are noted. There are scattered vascular calcifications in the soft tissues. IMPRESSION: No recent fracture or dislocation is seen in the left elbow. Electronically Signed   By: Elmer Picker M.D.   On: 03/22/2022 13:22    Labs:  CBC: Recent Labs    03/23/22 0002 03/23/22 0417 03/24/22 0304 03/25/22 0314  WBC 7.8 5.4 4.3 5.0  HGB 8.6* 8.5* 7.0* 7.4*  HCT 26.2* 25.5* 21.5* 21.9*  PLT 121* 88* 83* 77*     COAGS: Recent Labs    03/18/22 0910 03/19/22 0139 03/22/22 1425 03/23/22 0002 03/23/22 0417 03/24/22 0304 03/25/22 0314  INR 1.2   < > 1.5* 1.6* 1.5* 1.8* 1.7*  APTT 30  --  40*  --   --   --   --    < > = values in this interval not displayed.     BMP: Recent Labs    03/23/22 1545 03/24/22 0304 03/24/22 1238 03/25/22 0314  NA 133* 136 138 138  K 5.3* 5.4* 5.5* 4.8  CL 104 104 109 109  CO2 20* 21* 21* 19*  GLUCOSE 168* 152* 117* 106*  BUN 54* 56* 57* 57*  CALCIUM 8.5* 8.8* 8.9 8.7*  CREATININE 4.02* 3.66* 3.63* 3.45*  GFRNONAA 14* 16* 16* 17*     LIVER FUNCTION TESTS: Recent Labs    03/23/22 0002 03/23/22 0417 03/24/22 0304 03/25/22 0314  BILITOT 2.0* 1.9* 2.1* 2.1*  AST 398* 360* 193* 141*  ALT 242* 237* 164* 139*  ALKPHOS 348* 349* 305* 327*  PROT 4.5* 4.5* 5.3* 4.9*  ALBUMIN 2.2* 2.2* 3.2* 2.8*     Assessment and Plan:  81 y.o. male with shock with multi-organ failure including elevated transaminitis and AKI. Recent elective TIPS on 6/9/2, performed by Dr. Serafina Royals.   Ammonia now at 52 (from 6 yesterday).  Continue lactulose. Mentation improved.  Further treatment plan per PCCM/ nephrology  IR to follow.    Electronically Signed: Pasty Spillers, PA 03/25/2022, 12:46 PM   I spent a total of 15 Minutes at the the patient's bedside AND on the patient's hospital floor or  unit, greater than 50% of which was  counseling/coordinating care for shock s/p TIPS.

## 2022-03-25 NOTE — TOC Progression Note (Signed)
Transition of Care [facility]) - Initial/Assessment Note    Patient Details  Name: [name] MRN: [id number] Date of Birth: [birth year]  [facility]) CM/SW Contact:    Milinda Antis, [place] Phone Number: 03/25/2022, 4:42 PM  Clinical Narrative:                  Transition of Care Department [facility]) has reviewed patient and no TOC needs have been identified at this time.   Patient has [place] and is DNR.  Patient daughter reported that the patient would not accept living in SNF. We will continue to monitor patient advancement through interdisciplinary progression rounds. If new patient transition needs arise, please place a TOC consult.    Patient Goals and CMS Choice        Expected Discharge Plan and Services                                                Prior Living Arrangements/Services                       Activities of Daily Living      Permission Sought/Granted                  Emotional Assessment              Admission diagnosis:  Dehydration [P50.9] Alcoholic cirrhosis of liver with ascites ([place]) [K70.31] Hypotension [I95.9] Acute renal failure, unspecified acute renal failure type ([place]) [N17.9] Hypotension due to hypovolemia [I95.89, E86.1] Patient Active Problem List   Diagnosis Date Noted   Pressure injury of skin 03/23/2022   Acute renal failure ([place])    Shock ([place])    Hypotension 03/22/2022   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   Melena    Chronic gastric ulcer with hemorrhage    Varices of small intestine    Recurrent major depressive disorder, in full remission ([place]) 11/16/2021   Protein-calorie malnutrition, severe 11/05/2021   Sepsis ([place]) 11/04/2021   Acute lower UTI 11/04/2021   Other ascites    Sepsis due to Spontaneous bacterial peritonitis ([place]) 11/03/2021   Malnutrition of moderate degree 07/14/2021   Other cirrhosis of liver (HCC)    Mucosal abnormality of  stomach    Portal hypertensive gastropathy ([place])    Secondary esophageal varices without bleeding (H[facility])    Generalized weakness 07/13/2021   Chronic diarrhea 07/13/2021   Diarrhea due to malabsorption 07/13/2021   Iron deficiency anemia due to chronic blood loss 04/19/2021   Hepatocellular carcinoma ([place]) 06/01/2020   Anxiety 32/67/1245   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[place]) 12/14/2019   Aortic atherosclerosis ([place]) 80/99/8338   Chronic systolic heart failure ([place]) 12/05/2019   Macrocytic anemia 03/05/2018   GERD (gastroesophageal reflux disease) 09/30/2017   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 positive 03/29/2016   Thrombocytopenia ([place]) 12/21/2015   B12 deficiency 08/20/2015   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   Diabetic polyneuropathy associated with type 2 diabetes mellitus ([place]) 08/07/2015   Diplopia 06/05/2015   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 Off narcotics 06/05/17 due to negative UDS for opiates x2. Full history 06/12/14. Pain contract signed.  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[place]) 04/06/2007   Hyperlipidemia associated with type 2 diabetes mellitus ([place]) 04/06/2007   Essential hypertension 04/06/2007   Osteoarthritis 04/06/2007   PCP:  [name], MD Pharmacy:   [facility] [address] [place] [address] [place] [address] Phone: [phone] Fax:  [phone]  [facility] [place], [place] [address] Phone: [phone] Fax: [phone]     Social Determinants of Health (SDOH) Interventions    Readmission Risk Interventions    07/19/2021    3:02 PM  Readmission Risk Prevention Plan  Transportation Screening Complete  PCP or Specialist Appt within 5-7 Days Complete  Home Care Screening Complete

## 2022-03-25 NOTE — Procedures (Signed)
Cortrak  Person Inserting Tube:  Viera Okonski C, RD Tube Type:  Cortrak - 43 inches Tube Size:  10 Tube Location:  Left nare Secured by: Bridle Technique Used to Measure Tube Placement:  Marking at nare/corner of mouth Cortrak Secured At:  67 cm   Cortrak Tube Team Note:  Consult received to place a Cortrak feeding tube.   X-ray is required, abdominal x-ray has been ordered by the Cortrak team. Please confirm tube placement before using the Cortrak tube.   If the tube becomes dislodged please keep the tube and contact the Cortrak team at [email or web address] ([email or web address]) for replacement.  If after hours and replacement cannot be delayed, place a NG tube and confirm placement with an abdominal x-ray.    Zian Mohamed P., RD, LDN, CNSC See AMiON for contact information    

## 2022-03-26 DIAGNOSIS — I959 Hypotension, unspecified: Secondary | ICD-10-CM

## 2022-03-26 DIAGNOSIS — N179 Acute kidney failure, unspecified: Secondary | ICD-10-CM | POA: Diagnosis not present

## 2022-03-26 LAB — AMMONIA: Ammonia: 34 umol/L (ref 9–35)

## 2022-03-26 LAB — BODY FLUID CULTURE W GRAM STAIN
Culture: NO GROWTH
Gram Stain: NONE SEEN

## 2022-03-26 LAB — COMPREHENSIVE METABOLIC PANEL
ALT: 110 U/L — ABNORMAL HIGH (ref 0–44)
AST: 108 U/L — ABNORMAL HIGH (ref 15–41)
Albumin: 2.9 g/dL — ABNORMAL LOW (ref 3.5–5.0)
Alkaline Phosphatase: 322 U/L — ABNORMAL HIGH (ref 38–126)
Anion gap: 9 (ref 5–15)
BUN: 63 mg/dL — ABNORMAL HIGH (ref 8–23)
CO2: 21 mmol/L — ABNORMAL LOW (ref 22–32)
Calcium: 9 mg/dL (ref 8.9–10.3)
Chloride: 112 mmol/L — ABNORMAL HIGH (ref 98–111)
Creatinine, Ser: 3.13 mg/dL — ABNORMAL HIGH (ref 0.61–1.24)
GFR, Estimated: 19 mL/min — ABNORMAL LOW (ref >60)
Glucose, Bld: 166 mg/dL — ABNORMAL HIGH (ref 70–99)
Potassium: 3.6 mmol/L (ref 3.5–5.1)
Sodium: 142 mmol/L (ref 135–145)
Total Bilirubin: 1.6 mg/dL — ABNORMAL HIGH (ref 0.3–1.2)
Total Protein: 5 g/dL — ABNORMAL LOW (ref 6.5–8.1)

## 2022-03-26 LAB — GLUCOSE, CAPILLARY
Glucose-Capillary: 161 mg/dL — ABNORMAL HIGH (ref 70–99)
Glucose-Capillary: 161 mg/dL — ABNORMAL HIGH (ref 70–99)
Glucose-Capillary: 185 mg/dL — ABNORMAL HIGH (ref 70–99)
Glucose-Capillary: 191 mg/dL — ABNORMAL HIGH (ref 70–99)
Glucose-Capillary: 203 mg/dL — ABNORMAL HIGH (ref 70–99)
Glucose-Capillary: 173 mg/dL — ABNORMAL HIGH (ref 70–99)

## 2022-03-26 LAB — MAGNESIUM: Magnesium: 1.9 mg/dL (ref 1.7–2.4)

## 2022-03-26 LAB — PHOSPHORUS: Phosphorus: 4.4 mg/dL (ref 2.5–4.6)

## 2022-03-26 LAB — CBC
HCT: 22.7 % — ABNORMAL LOW (ref 39.0–52.0)
Hemoglobin: 7.3 g/dL — ABNORMAL LOW (ref 13.0–17.0)
MCH: 30.9 pg (ref 26.0–34.0)
MCHC: 32.2 g/dL (ref 30.0–36.0)
MCV: 96.2 fL (ref 80.0–100.0)
Platelets: 89 10*3/uL — ABNORMAL LOW (ref 150–400)
RBC: 2.36 MIL/uL — ABNORMAL LOW (ref 4.22–5.81)
RDW: 21.2 % — ABNORMAL HIGH (ref 11.5–15.5)
WBC: 4.5 10*3/uL (ref 4.0–10.5)
nRBC: 0 % (ref 0.0–0.2)

## 2022-03-26 LAB — PROTIME-INR
INR: 1.9 — ABNORMAL HIGH (ref 0.8–1.2)
Prothrombin Time: 21.3 seconds — ABNORMAL HIGH (ref 11.4–15.2)

## 2022-03-26 MED ORDER — DEXTROSE 5 % IV SOLN
120.0000 mg | Freq: Once | INTRAVENOUS | Status: AC
  Administered 2022-03-26: 120 mg via INTRAVENOUS
  Filled 2022-03-26: qty 10

## 2022-03-26 NOTE — Progress Notes (Signed)
Progress Note:    [name]    UYQ:[id number] DOB: 10/08/1941 DOA: 03/22/2022  PCP: [name], MD    Brief Narrative:   81 year old man who presented to [facility] 6/13 for weakness, decreased mobility and poor PO intake s/p fall 3 days PTA. PMHx significant for HTN, HLD, mild AS, HFrEF (EF as low as 20-25%, recovered with Echo 02/2022 EF 55-60%, G1DD), COPD, prior tobacco use, T2DM, NASH cirrhosis with portal hypertension as evidenced by EV, PHG, ascites requiring frequent paracentesis (c/b SBP) and HCC (s/p radiation), gastric/duodenal ulcers and diverticulosis, IDA. Recently underwent TIPS placement with IR 6/9 with gradient reduction from 13 to 2. Monitored x 24H and discharged home 6/10.  Patient reportedly had a fall 6/10 shortly after arriving home, sustaining a L arm injury. Since injury, patient's mobility has been decreased and appetite has been poor; additionally reporting decreased UOP and constipation. Patient was advised by IR to present to ED for further evaluation. Presented to [facility] ED 6/13 for weakness, decreased mobility and poor PO intake s/p fall. Vitals on ED arrival were notable for BP 89/35. Labs notable for new transaminitis with AST 545/ALT 293, Cr 4.78 (baseline 1.3-1.8), INR 1.5, WBC/LA WNL. Hgb 8.7, near baseline. CXR with small L pleural effusion. CT C-spine and L arm/shoulder imaging negative. Fluid resuscitation was initiated (3L) with slight improvement in SBPs to 80s. RIJ CVC placed and Levophed was initiated for additional BP support. PCCM consulted for ICU admission/transfer to [facility].  Transferred to [facility] for continued care.  Family wants no escalation of care.  Continue medical support in hopes of improvement.  Considering comfort care.  No HD, vasopressors, ventilator or CPR.   Subjective:   Seen and examined this morning.  Confused.  Can barely tell me his name.  No family at bedside.  Assessment and Plan:   Hepatic encephalopathy: Continue  lactulose, and rifaximin  Shock: Vasopressors off.  Cannot rule out sepsis.  Being empirically treated for SBP.  Continue Rocephin.  Peritoneal fluid cultures negative thus far.  Liver cirrhosis with portal hypertension ascites status post TIPS  Acute renal failure: Family declines dialysis.  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  Minimal urine output essentially anuric.  Strict I's and O's.  Given Lasix challenge yesterday by critical care with improvement in urine output but overall still poor.  Creatinine did not improve slightly.  Nephrology consulted and following.  We will give another Lasix challenge today.  Could consider midodrine for hypoperfusion support if family agrees.  Hepatocellular carcinoma status post SBRT: No acute treatment.  HFrEF: EF improved to 55 to 60%, previously about 20%: Holding home guideline directed medical therapy including Entresto, Coreg due to shock state and acute renal failure  COPD: Not in acute exacerbation  Thrombocytopenia, elevated INR: Secondary to liver cirrhosis: Monitor platelets, hemoglobin.  No signs of bleeding  Type 2 diabetes mellitus: Continue SSI.  CBGs every 4 hours  Gastric and duodenal ulcers: Continue PPI  Depression/anxiety: Holding home Lexapro and BuSpar due to encephalopathy   Other information:    DVT prophylaxis: Heparin subcu Code Status: DNR Family Communication: Discussed with son over phone Disposition:   Status is: Inpatient Remains inpatient appropriate because: Not medically cleared.  Family considering comfort care       Consultants:   Nephrology    Objective:    Vitals:   03/26/22 0900 03/26/22 1000 03/26/22 1100 03/26/22 1131  BP: (!) 98/51 (!) 115/58 114/64   Pulse: 93 93 98  Resp: (!) '9 12 10   '$ Temp:    (!) 97.4 F (36.3 C)  TempSrc:    Axillary  SpO2: 99% 98% 99%   Weight:      Height:        Intake/Output Summary (Last 24 hours) at 03/26/2022 1354 Last data filed at 03/26/2022  0800 Gross per 24 hour  Intake 662.17 ml  Output 820 ml  Net -157.83 ml   Filed Weights   03/23/22 0458 03/25/22 0500 03/26/22 0500  Weight: 84.4 kg 87.5 kg 87.2 kg       Physical Exam:   Gen confused, somnolent, mittens bilat in place briefly awakens to sternal rub No rash, cyanosis or gangrene Chest clear bilat to bases, no rales/ wheezing RRR no MRG Abd soft ntnd no mass, 2+ nontense ascites, +bs GU normal male, condom cath in place, penile and scrotal edema MS no joint effusions or deformity Ext 1+ pitting dependent hip edema bilat, no wounds or ulcers   Data Reviewed:    I have personally reviewed following labs and imaging studies  CBC: Recent Labs  Lab 03/22/22 1425 03/23/22 0002 03/23/22 0417 03/24/22 0304 03/25/22 0314 03/26/22 0636  WBC 5.1 7.8 5.4 4.3 5.0 4.5  NEUTROABS 4.0  --   --   --   --   --   HGB 8.7* 8.6* 8.5* 7.0* 7.4* 7.3*  HCT 28.3* 26.2* 25.5* 21.5* 21.9* 22.7*  MCV 98.6 95.3 94.1 94.7 94.0 96.2  PLT 87* 121* 88* 83* 77* 89*    Basic Metabolic Panel: Recent Labs  Lab 03/23/22 0417 03/23/22 1009 03/23/22 1545 03/24/22 0304 03/24/22 1238 03/25/22 0314 03/25/22 1710 03/26/22 0636  NA 133*   < > 133* 136 138 138  --  142  K 5.6*   < > 5.3* 5.4* 5.5* 4.8  --  3.6  CL 105   < > 104 104 109 109  --  112*  CO2 21*   < > 20* 21* 21* 19*  --  21*  GLUCOSE 179*   < > 168* 152* 117* 106*  --  166*  BUN 53*   < > 54* 56* 57* 57*  --  63*  CREATININE 4.53*   < > 4.02* 3.66* 3.63* 3.45*  --  3.13*  CALCIUM 8.4*   < > 8.5* 8.8* 8.9 8.7*  --  9.0  MG 2.1  --   --  1.9  --  2.0 2.0 1.9  PHOS 4.5  --   --  4.9*  --  5.3* 4.8* 4.4   < > = values in this interval not displayed.    GFR: Estimated Creatinine Clearance: 19.1 mL/min (A) (by C-G formula based on SCr of 3.13 mg/dL (H)).  Liver Function Tests: Recent Labs  Lab 03/23/22 0002 03/23/22 0417 03/24/22 0304 03/25/22 0314 03/26/22 0636  AST 398* 360* 193* 141* 108*  ALT 242*  237* 164* 139* 110*  ALKPHOS 348* 349* 305* 327* 322*  BILITOT 2.0* 1.9* 2.1* 2.1* 1.6*  PROT 4.5* 4.5* 5.3* 4.9* 5.0*  ALBUMIN 2.2* 2.2* 3.2* 2.8* 2.9*    CBG: Recent Labs  Lab 03/25/22 1926 03/25/22 2338 03/26/22 0326 03/26/22 0724 03/26/22 1129  GLUCAP 125* 141* 161* 161* 185*     Recent Results (from the past 240 hour(s))  Blood culture (routine x 2)     Status: None (Preliminary result)   Collection Time: 03/22/22  2:26 PM   Specimen: BLOOD RIGHT ARM  Result Value Ref Range  Status   Specimen Description BLOOD RIGHT ARM  Final   Special Requests   Final    Immunocompromised BOTTLES DRAWN AEROBIC AND ANAEROBIC Blood Culture results may not be optimal due to an inadequate volume of blood received in culture bottles   Culture   Final    NO GROWTH 4 DAYS Performed at [facility], [address], [place], [place] [address]    Report Status PENDING  Incomplete  Blood culture (routine x 2)     Status: None (Preliminary result)   Collection Time: 03/22/22  2:31 PM   Specimen: BLOOD LEFT WRIST  Result Value Ref Range Status   Specimen Description BLOOD LEFT WRIST  Final   Special Requests   Final    Immunocompromised BOTTLES DRAWN AEROBIC AND ANAEROBIC Blood Culture adequate volume   Culture   Final    NO GROWTH 4 DAYS Performed at [facility], [address] [place], [place] [address]    Report Status PENDING  Incomplete  MRSA Next Gen by PCR, Nasal     Status: None   Collection Time: 03/23/22 12:07 AM  Result Value Ref Range Status   MRSA by PCR Next Gen NOT DETECTED NOT DETECTED Final    Comment: (NOTE) The GeneXpert MRSA Assay (FDA approved for NASAL specimens only), is one component of a comprehensive MRSA colonization surveillance program. It is not intended to diagnose MRSA infection nor to guide or monitor treatment for MRSA infections. Test performance is not FDA approved in patients less than 87 years old. Performed at [facility], [place] [address] [place], [place] [address]   Urine Culture     Status: None   Collection Time: 03/23/22  2:02 PM   Specimen: Urine, Catheterized  Result Value Ref Range Status   Specimen Description URINE, CATHETERIZED  Final   Special Requests NONE  Final   Culture   Final    NO GROWTH Performed at [facility], [address], [place], [place] [address]    Report Status 03/24/2022 FINAL  Final  Body fluid culture w Gram Stain     Status: None   Collection Time: 03/23/22  2:46 PM   Specimen: Body Fluid  Result Value Ref Range Status   Specimen Description FLUID PARACENTESIS  Final   Special Requests NONE  Final   Gram Stain NO WBC SEEN NO ORGANISMS SEEN   Final   Culture   Final    NO GROWTH 3 DAYS Performed at [facility], [address] [place],  [address]    Report Status 03/26/2022 FINAL  Final         Radiology Studies:    DG Abd Portable 1V  Result Date: 03/25/2022 CLINICAL DATA:  Provided history: Feeding tube insertion. EXAM: PORTABLE ABDOMEN - 1 VIEW COMPARISON:  Abdominal radiograph 03/24/2022. FINDINGS: Problem-oriented examination for enteric tube position. An enteric tube passes below the level left hemidiaphragm with tip projecting in the expected location of the gastric body. Redemonstrated small amount of enteric contrast within the left upper quadrant of the abdomen. Mild air distension of small and large bowel within the imaged abdomen. Prior TIPS. Degenerative changes of the spine. IMPRESSION: Enteric tube present with tip projecting in expected location of the gastric body. Electronically Signed   By: Kellie Simmering D.O.   On: 03/25/2022 10:54   US LIVER DOPPLER  Result Date: 03/25/2022 CLINICAL DATA:  Status post TIPS on 03/18/2022. Current admission for encephalopathy, possible  sepsis and acute kidney injury. Assessment of TIPS and portal vein patency. EXAM: DUPLEX ULTRASOUND OF LIVER AND TIPS SHUNT TECHNIQUE: Color and duplex Doppler ultrasound was  performed to evaluate the hepatic in-flow and out-flow vessels. COMPARISON:  None Available. FINDINGS: Portal Vein Velocities Main:  52 cm/sec Right:  84 cm/sec Left:  18 cm/sec TIPS Stent Velocities Proximal:  110 cm/sec Mid:  190 Distal:  103 cm/sec IVC: Present and patent with normal respiratory phasicity. Hepatic Vein Velocities Right:  50 cm/sec Mid:  48 cm/sec Left:  32 cm/sec Splenic Vein: 36 Superior Mesenteric Vein: None visualized. Hepatic Artery: 142 Ascities: Moderate ascites present. Varices: None visualized. Portal vein waveforms are normal and flow is towards the liver. The TIPS shunt is normally patent. Hepatic vein waveforms are normal. Normal spleen size. IMPRESSION: Normal patency of TIPS shunt and portal vein. Electronically Signed   By: Aletta Edouard M.D.   On: 03/25/2022 08:14   US RENAL  Result Date: 03/24/2022 CLINICAL DATA:  Status post tips procedure. EXAM: RENAL / URINARY TRACT ULTRASOUND COMPLETE COMPARISON:  MRI 03/16/2022 FINDINGS: Right Kidney: Renal measurements: 10.3 x 5.2 x 5.1 cm = volume: 144.4 mL. Mild age related renal cortical thinning but normal echogenicity and no focal lesions or hydronephrosis. Left Kidney: Renal measurements: 9.3 x 5.5 x 5.1 cm = volume: 135.6 mL. Mild age related renal cortical thinning but normal echogenicity. No focal lesions or hydronephrosis. Bladder: Appears normal for degree of bladder distention. Other: Moderate ascites. IMPRESSION: No renal lesions or hydronephrosis. Unremarkable sonographic appearance of the bladder. Electronically Signed   By: Marijo Sanes M.D.   On: 03/24/2022 19:47        Medications:    Scheduled Meds:  Chlorhexidine Gluconate Cloth  6 each Topical Daily   feeding supplement (PROSource TF)  45 mL Per Tube TID   heparin  5,000 Units Subcutaneous Q8H   insulin aspart  0-9 Units Subcutaneous Q4H   lactulose  30 g Per Tube TID   mouth rinse  15 mL Mouth Rinse BID   pantoprazole (PROTONIX) IV  40 mg  Intravenous Q12H   rifaximin  550 mg Per Tube BID   Continuous Infusions:  sodium chloride Stopped (03/25/22 1154)   cefTRIAXone (ROCEPHIN)  IV Stopped (03/26/22 0028)   feeding supplement (OSMOLITE 1.5 CAL) 40 mL/hr at 03/26/22 0605       LOS: 4 days    Time spent: 55 minutes    Leslee Home, MD [facility]   To contact the attending provider between 7A-7P or the covering provider during after hours 7P-7A, please log into the web site [email or web address] and access using universal [place] password for that web site. If you do not have the password, please call the hospital operator.  03/26/2022, 1:54 PM

## 2022-03-26 NOTE — Progress Notes (Signed)
[facility] Progress Note Patient Name: [name] DOB: 1941-09-20 MRN: [id number]   Date of Service  03/26/2022  HPI/Events of Note  Patient has a positive bladder scan with > 350 ml of urine in the bladder but bedside RN is unable to catheterize patient due to prior meatal trauma / swelling and penile retraction.  eICU Interventions  Bedside RN requested to have an experienced nurse from the urology floor to come and place a Foley catheter.        Kerry Kass Kyona Chauncey 03/26/2022, 4:32 AM

## 2022-03-26 NOTE — Progress Notes (Signed)
[facility] called per MD Ogan request for one of their RN's to come assess which foley would be best for the pt. They said that they would send someone, but that it would be awhile before they could come. Will continue to monitor.

## 2022-03-26 NOTE — Consult Note (Signed)
Renal Service Consult Note [facility]  ALASDAIR KLEVE 03/26/2022 Sol Blazing, MD Requesting Physician: Dr. Rowe Pavy  Reason for Consult: Renal faliure HPI: The patient is a 81 y.o. year-old w/ hx of BPH, COPD, HTN, cirrhosis, HCC, HL, SBP, DM2 who recently underwent TIPS procedure on 6/9 and presented on 6/13 w/ progressive poor po intake, and declining mobility. Work-up showed ^LFTS and creat 4.7 (1.3- 1.8). Pt was in shock and was admitted and started on IV pressor support and got 3 L fluid challenge. Pressors were weaned off, cx's were negative. AMS not good and hasn't really improved. Creat improved to 3.6, then 3.4 yesterday and 3.1 today. No UOP though until IV lasix given yesterday 189m and 700 cc OUP were recorded. We are asked to see for AKI.    Pt is confused, no hx obtained.   ROS - n/a  Past Medical History  Past Medical History:  Diagnosis Date   Acute renal failure (HCC)    Aortic atherosclerosis (HCC)    Benign prostatic hypertrophy    Blood transfusion without reported diagnosis    CHF (congestive heart failure) (HCC)    a. EF 20-25% by echo in 11/2019   COPD (chronic obstructive pulmonary disease) ([place] 12/15/2009   FEV1 2.30 (70%) ratio 63 no better with B2 and DLCO 18/6 (73%) corrects to 106%   Degenerative joint disease    Depression    Diverticulosis    Duodenal varices    Esophageal varices (HCC)    Essential hypertension    ACE inhibitor cough   Fatty liver    Gastric ulcer    Hepatic cirrhosis (HCC)    Hepatocellular carcinoma (HCC)    Hyperlipidemia    IDA (iron deficiency anemia)    Internal hemorrhoids    Liver cancer ([facility]    Low back pain    Otitis externa 07/30/2012   Pneumonia    Portal hypertensive gastropathy (HCC)    SBP (spontaneous bacterial peritonitis) ([facility]    Sepsis ([facility]    Splenomegaly    Thrombocytopenia (HCC)    Type 2 diabetes mellitus ([facility]    no meds   Ulcer of foot ([facility]    Right foot   Past  Surgical History  Past Surgical History:  Procedure Laterality Date   BIOPSY  07/14/2021   Procedure: BIOPSY;  Surgeon: CDaryel November MD;  [place] [facility]  Service: Gastroenterology;;  EGD and COLON   BIOPSY  02/03/2022   Procedure: BIOPSY;  Surgeon: NMauri Pole MD;  [facility];  Service: Gastroenterology;;   COLONOSCOPY  2020   JMP-MAC-plenvu (good)-polyps-recall 1 yr   COLONOSCOPY WITH PROPOFOL N/A 07/14/2021   Procedure: COLONOSCOPY WITH PROPOFOL;  Surgeon: CDaryel November MD;  [facility];  Service: Gastroenterology;  Laterality: N/A;   ESOPHAGOGASTRODUODENOSCOPY (EGD) WITH PROPOFOL N/A 07/14/2021   Procedure: ESOPHAGOGASTRODUODENOSCOPY (EGD) WITH PROPOFOL;  Surgeon: CDaryel November MD;  [place] [facility]  Service: Gastroenterology;  Laterality: N/A;   ESOPHAGOGASTRODUODENOSCOPY (EGD) WITH PROPOFOL N/A 02/03/2022   Procedure: ESOPHAGOGASTRODUODENOSCOPY (EGD) WITH PROPOFOL;  Surgeon: NMauri Pole MD;  [facility];  Service: Gastroenterology;  Laterality: N/A;   EYE SURGERY     HEMOSTASIS CLIP PLACEMENT  02/03/2022   Procedure: HEMOSTASIS CLIP PLACEMENT;  Surgeon: NMauri Pole MD;  Location: [facility];  Service: Gastroenterology;;   IR EMBO TUMOR ORGAN ISCHEMIA INFARCT INC GUIDE ROADMAPPING  06/01/2020   IR INTRAVASCULAR ULTRASOUND NON CORONARY  03/18/2022   IR  PARACENTESIS  08/30/2021   IR PARACENTESIS  09/22/2021   IR PARACENTESIS  10/12/2021   IR PARACENTESIS  11/02/2021   IR PARACENTESIS  11/15/2021   IR PARACENTESIS  11/30/2021   IR PARACENTESIS  12/14/2021   IR PARACENTESIS  12/27/2021   IR PARACENTESIS  01/04/2022   IR PARACENTESIS  01/12/2022   IR PARACENTESIS  01/19/2022   IR PARACENTESIS  02/02/2022   IR PARACENTESIS  02/16/2022   IR PARACENTESIS  02/28/2022   IR PARACENTESIS  03/15/2022   IR PARACENTESIS  03/18/2022   IR RADIOLOGIST EVAL & MGMT  04/28/2020   IR RADIOLOGIST EVAL &  MGMT  07/15/2020   IR RADIOLOGIST EVAL & MGMT  05/04/2021   IR TIPS  03/18/2022   IR US GUIDE VASC ACCESS RIGHT  03/18/2022   IR US GUIDE VASC ACCESS RIGHT  03/18/2022   RADIOLOGY WITH ANESTHESIA N/A 03/18/2022   Procedure: TIPS;  Surgeon: Suzette Battiest, MD;  Location: [place];  Service: Radiology;  Laterality: N/A;   Family History  Family History  Problem Relation Age of Onset   Melanoma Mother    Stroke Father    CAD Brother        Premature disease   Colon polyps Neg Hx    Colon cancer Neg Hx    Esophageal cancer Neg Hx    Stomach cancer Neg Hx    Rectal cancer Neg Hx    Social History  reports that he quit smoking about 37 years ago. His smoking use included cigarettes. He has a 80.00 pack-year smoking history. He has never used smokeless tobacco. He reports that he does not drink alcohol and does not use drugs. Allergies No Known Allergies Home medications Prior to Admission medications   Medication Sig Start Date End Date Taking? Authorizing Provider  albuterol (VENTOLIN HFA) 108 (90 Base) MCG/ACT inhaler Inhale 2 puffs into the lungs every 6 (six) hours as needed for wheezing or shortness of breath. 11/01/21  Yes [name], MD  busPIRone (BUSPAR) 5 MG tablet Take 5 mg by mouth 2 (two) times daily as needed (anxiety).   Yes [provider]  busPIRone (BUSPAR) 7.5 MG tablet Take 1 tablet (7.5 mg total) by mouth 2 (two) times daily as needed. for anxiety Patient taking differently: Take 7.5 mg by mouth 2 (two) times daily as needed (anxiety). 05/14/21  Yes [name], MD  carvedilol (COREG) 6.25 MG tablet Take 1 tablet (6.25 mg total) by mouth 2 (two) times daily with a meal. 02/01/22  Yes [name], MD  ciprofloxacin (CIPRO) 250 MG tablet Take 1 tablet (250 mg total) by mouth daily. Patient taking differently: Take 250 mg by mouth daily. Continuous course. 02/09/22  Yes Pyrtle, Lajuan Lines, MD  ENTRESTO 24-26 MG Take 1 tablet by mouth 2 (two) times daily. 01/26/22   Yes [provider]  escitalopram (LEXAPRO) 5 MG tablet Take 1 tablet (5 mg total) by mouth daily. 05/14/21  Yes [name], MD  finasteride (PROSCAR) 5 MG tablet TAKE 1 TABLET EVERY DAY Patient taking differently: Take 5 mg by mouth daily. 01/10/22  Yes [name], MD  furosemide (LASIX) 20 MG tablet Take 20 mg by mouth daily. 01/04/22  Yes [provider]  gabapentin (NEURONTIN) 300 MG capsule TAKE 1 CAPSULE TWICE DAILY Patient taking differently: Take 300 mg by mouth 2 (two) times daily as needed (pain). 03/15/21  Yes [name], MD  lactulose [facility]) 10 GM/15ML solution  Take 30 mLs (20 g total) by mouth 3 (three) times daily. 03/19/22 04/18/22 Yes Han, Aimee H, PA-C  metFORMIN (GLUCOPHAGE) 500 MG tablet Take 500 mg by mouth 3 (three) times daily.   Yes [provider]  oxyCODONE (ROXICODONE) 5 MG immediate release tablet Take 2.5 mg by mouth every 6 (six) hours as needed for severe pain.   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 03/10/22 04/20/22 Yes [name], MD  pantoprazole (PROTONIX) 40 MG tablet Take 1 tablet (40 mg total) by mouth 2 (two) times daily. 02/01/22  Yes [name], MD  rifaximin (XIFAXAN) 550 MG TABS tablet Take 1 tablet (550 mg total) by mouth 2 (two) times daily. 02/14/22  Yes Pyrtle, Lajuan Lines, MD  rosuvastatin (CRESTOR) 10 MG tablet Take 10 mg by mouth daily.   Yes [provider]  spironolactone (ALDACTONE) 50 MG tablet Take 50 mg by mouth daily.   Yes [provider]  sucralfate (CARAFATE) 1 g tablet Take 1 tablet (1 g total) by mouth 4 (four) times daily. Patient taking differently: Take 1 g by mouth 2 (two) times daily. 02/03/22 02/03/23 Yes Nandigam, Venia Minks, MD  tamsulosin (FLOMAX) 0.4 MG CAPS capsule TAKE 1 CAPSULE EVERY DAY Patient taking  differently: Take 0.4 mg by mouth daily. 03/14/22  Yes [name], MD  rosuvastatin (CRESTOR) 5 MG tablet TAKE 1 TABLET EVERY DAY Patient not taking: Reported on 03/23/2022 11/16/21   [name], MD     Vitals:   03/26/22 0726 03/26/22 0800 03/26/22 0900 03/26/22 1000  BP:  (!) 97/58 (!) 98/51 (!) 115/58  Pulse:  95 93 93  Resp:  10 (!) 9 12  Temp: 97.6 F (36.4 C)     TempSrc: Axillary     SpO2:  98% 99% 98%  Weight:      Height:       Exam Gen confused, somnolent, mittens bilat in place No rash, cyanosis or gangrene Sclera anicteric, throat clear  Jvp 12, or bruits Chest clear bilat to bases, no rales/ wheezing RRR no MRG Abd soft ntnd no mass, 2+ nontense ascites, +bs GU normal male, condom cath in place MS no joint effusions or deformity Ext 1+ pitting dependent hip edema bilat, no wounds or ulcers Neuro as above       Home meds include - buspirone, carvedilol 6.25 bid, entresto, escitalopram, finasteride, furosemide, gabapentin, lactulose, oxycodone prn, pantoprazole, rifaximin, sucralfate, tamsulosin, rosuvastatin   UA 6/14 - prot neg, 21-50 rbc, 6-10 wbc, rare bact  UNa <10 , UCr 278   Echo 6/14 - normal LVEF 65-70%    BP's here 90s- 110s / 55-70, MAPs 70- 85 last 48hrs      Assessment/ Plan: 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 Creat here 4.7 on admit 6/13 and now down to 3.1 but w/o a lot of recorded UOP. Some of the creat drop is from vol loading (up 8kg), but there was 700 cc UOP yesterday after IV lasix. Pt is mild-mod vol overloaded. 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 Overall prognosis is poor. Per pmd pt is DNR and family does not want advanced interventions (e.g. dialysis). Pt would be poor candidate for dialysis regardless. Have reordered the IV lasix 120 mg x 1. Comfort care is  being considered. No other suggestions at this time.  Will sign off, please call as needed.  Cirrhosis - sp TIPS on  6/9 [facility] AMS - hepatic encephalopathy most likely       Rob Dave Mannes  MD 03/26/2022, 10:49 AM  Recent Labs  Lab 03/25/22 0314 03/25/22 1710 03/26/22 0636  HGB 7.4*  --  7.3*  ALBUMIN 2.8*  --  2.9*  CALCIUM 8.7*  --  9.0  PHOS 5.3* 4.8* 4.4  CREATININE 3.45*  --  3.13*  K 4.8  --  3.6

## 2022-03-26 NOTE — Plan of Care (Signed)
  Problem: Health Behavior/Discharge Planning: Goal: Ability to safely manage health-related needs after discharge will improve Outcome: Progressing   Problem: Clinical Measurements: Goal: Ability to maintain clinical measurements within normal limits will improve Outcome: Progressing Goal: Will remain free from infection Outcome: Progressing   Problem: Cardiovascular: Goal: Ability to achieve and maintain adequate cardiovascular perfusion will improve Outcome: Progressing Goal: Vascular access site(s) Level 0-1 will be maintained Outcome: Progressing

## 2022-03-26 NOTE — Progress Notes (Signed)
Pt's bladder scans have been showing around 341m when check at 2200 (6/16), 0200 (6/17), and 0400 (6/17). This RN did not think that they were accurate and attempted to do an I&O catheter. However, the pt's foreskin is extremely tight and would not pull back. Penis was cleaned the best it could be and insertion attempted, the catheter would not go into the urethra. Elink made aware.

## 2022-03-27 DIAGNOSIS — I959 Hypotension, unspecified: Secondary | ICD-10-CM | POA: Diagnosis not present

## 2022-03-27 DIAGNOSIS — N179 Acute kidney failure, unspecified: Secondary | ICD-10-CM | POA: Diagnosis not present

## 2022-03-27 LAB — CBC WITH DIFFERENTIAL/PLATELET
Abs Immature Granulocytes: 0.1 10*3/uL — ABNORMAL HIGH (ref 0.00–0.07)
Basophils Absolute: 0 10*3/uL (ref 0.0–0.1)
Basophils Relative: 0 %
Eosinophils Absolute: 0.1 10*3/uL (ref 0.0–0.5)
Eosinophils Relative: 2 %
HCT: 26.4 % — ABNORMAL LOW (ref 39.0–52.0)
HCT: 25.8 % — ABNORMAL LOW (ref 39.0–52.0)
Hemoglobin: 8.5 g/dL — ABNORMAL LOW (ref 13.0–17.0)
Hemoglobin: 8.6 g/dL — ABNORMAL LOW (ref 13.0–17.0)
Immature Granulocytes: 2 %
Lymphocytes Relative: 10 %
Lymphs Abs: 0.6 10*3/uL — ABNORMAL LOW (ref 0.7–4.0)
MCH: 31.3 pg (ref 26.0–34.0)
MCH: 31.9 pg (ref 26.0–34.0)
MCHC: 32.2 g/dL (ref 30.0–36.0)
MCHC: 33.3 g/dL (ref 30.0–36.0)
MCV: 97.1 fL (ref 80.0–100.0)
MCV: 95.6 fL (ref 80.0–100.0)
Monocytes Absolute: 0.6 10*3/uL (ref 0.1–1.0)
Monocytes Relative: 11 %
Neutro Abs: 4.4 10*3/uL (ref 1.7–7.7)
Neutrophils Relative %: 75 %
Platelets: 102 10*3/uL — ABNORMAL LOW (ref 150–400)
Platelets: 109 10*3/uL — ABNORMAL LOW (ref 150–400)
RBC: 2.72 MIL/uL — ABNORMAL LOW (ref 4.22–5.81)
RBC: 2.7 MIL/uL — ABNORMAL LOW (ref 4.22–5.81)
RDW: 22.1 % — ABNORMAL HIGH (ref 11.5–15.5)
RDW: 22.5 % — ABNORMAL HIGH (ref 11.5–15.5)
WBC: 5.2 10*3/uL (ref 4.0–10.5)
WBC: 5.8 10*3/uL (ref 4.0–10.5)
nRBC: 0.8 % — ABNORMAL HIGH (ref 0.0–0.2)
nRBC: 1 % — ABNORMAL HIGH (ref 0.0–0.2)

## 2022-03-27 LAB — CULTURE, BLOOD (ROUTINE X 2)
Culture: NO GROWTH
Culture: NO GROWTH

## 2022-03-27 LAB — GLUCOSE, CAPILLARY
Glucose-Capillary: 211 mg/dL — ABNORMAL HIGH (ref 70–99)
Glucose-Capillary: 228 mg/dL — ABNORMAL HIGH (ref 70–99)
Glucose-Capillary: 151 mg/dL — ABNORMAL HIGH (ref 70–99)
Glucose-Capillary: 203 mg/dL — ABNORMAL HIGH (ref 70–99)
Glucose-Capillary: 216 mg/dL — ABNORMAL HIGH (ref 70–99)
Glucose-Capillary: 179 mg/dL — ABNORMAL HIGH (ref 70–99)

## 2022-03-27 LAB — COMPREHENSIVE METABOLIC PANEL
ALT: 107 U/L — ABNORMAL HIGH (ref 0–44)
AST: 102 U/L — ABNORMAL HIGH (ref 15–41)
Albumin: 3.1 g/dL — ABNORMAL LOW (ref 3.5–5.0)
Alkaline Phosphatase: 350 U/L — ABNORMAL HIGH (ref 38–126)
Anion gap: 12 (ref 5–15)
BUN: 65 mg/dL — ABNORMAL HIGH (ref 8–23)
CO2: 20 mmol/L — ABNORMAL LOW (ref 22–32)
Calcium: 9.3 mg/dL (ref 8.9–10.3)
Chloride: 113 mmol/L — ABNORMAL HIGH (ref 98–111)
Creatinine, Ser: 2.83 mg/dL — ABNORMAL HIGH (ref 0.61–1.24)
GFR, Estimated: 22 mL/min — ABNORMAL LOW (ref >60)
Glucose, Bld: 193 mg/dL — ABNORMAL HIGH (ref 70–99)
Potassium: 3.5 mmol/L (ref 3.5–5.1)
Sodium: 145 mmol/L (ref 135–145)
Total Bilirubin: 2.2 mg/dL — ABNORMAL HIGH (ref 0.3–1.2)
Total Protein: 5.2 g/dL — ABNORMAL LOW (ref 6.5–8.1)

## 2022-03-27 MED ORDER — SODIUM CHLORIDE 0.9 % IV SOLN
2.0000 g | Freq: Every day | INTRAVENOUS | Status: AC
  Administered 2022-03-27 – 2022-03-30 (×4): 2 g via INTRAVENOUS
  Filled 2022-03-27 (×4): qty 20

## 2022-03-27 MED ORDER — DEXTROSE 5 % IV SOLN
120.0000 mg | Freq: Once | INTRAVENOUS | Status: AC
  Administered 2022-03-27: 120 mg via INTRAVENOUS
  Filled 2022-03-27: qty 2

## 2022-03-27 MED ORDER — SODIUM BICARBONATE 650 MG PO TABS: 650.0000 mg | ORAL_TABLET | Freq: Two times a day (BID) | ORAL | Status: DC

## 2022-03-27 NOTE — Plan of Care (Signed)
  Problem: Activity: Goal: Ability to return to baseline activity level will improve Outcome: Progressing   Problem: Cardiovascular: Goal: Ability to achieve and maintain adequate cardiovascular perfusion will improve Outcome: Progressing   Problem: Health Behavior/Discharge Planning: Goal: Ability to safely manage health-related needs after discharge will improve Outcome: Not Progressing

## 2022-03-27 NOTE — Progress Notes (Addendum)
Progress Note:    [name]    ATF:[id number] DOB: 18-Aug-1941 DOA: 03/22/2022  PCP: [name], MD    Brief Narrative:   81 year old man who presented to [facility] 6/13 for weakness, decreased mobility and poor PO intake s/p fall 3 days PTA. PMHx significant for HTN, HLD, mild AS, HFrEF (EF as low as 20-25%, recovered with Echo 02/2022 EF 55-60%, G1DD), COPD, prior tobacco use, T2DM, NASH cirrhosis with portal hypertension as evidenced by EV, PHG, ascites requiring frequent paracentesis (c/b SBP) and HCC (s/p radiation), gastric/duodenal ulcers and diverticulosis, IDA. Recently underwent TIPS placement with IR 6/9 with gradient reduction from 13 to 2. Monitored x 24H and discharged home 6/10.  Patient reportedly had a fall 6/10 shortly after arriving home, sustaining a L arm injury. Since injury, patient's mobility has been decreased and appetite has been poor; Additionally reporting decreased UOP and constipation. Patient was advised by IR to present to ED for further evaluation. Presented to [facility] ED 6/13 for weakness, decreased mobility and poor PO intake s/p fall. Vitals on ED arrival were notable for BP 89/35. Labs notable for new transaminitis with AST 545/ALT 293, Cr 4.78 (baseline 1.3-1.8), INR 1.5, WBC/LA WNL. Hgb 8.7, near baseline. CXR with small L pleural effusion. CT C-spine and L arm/shoulder imaging negative. Fluid resuscitation was initiated (3L) with slight improvement in SBPs to 80s. RIJ CVC placed and Levophed was initiated for additional BP support. PCCM consulted for ICU admission/transfer to [facility].   Transferred to [facility] for continued care.  Family wants no escalation of care.  Continue medical support in hopes of improvement.  Considering comfort care if there is a further decline in clinical status.  No HD, vasopressors, ventilator or CPR. Overall prognosis poor.   Subjective:   Seen and examined this morning.  More alert today than yesterday. Can tell me his name  clearly but not much else. Family at bedside. Has external catheter in place for urine output monitoring. Family holding off on comfort care for now with continued improvement in kidney function. Overall prognosis still poor. On tube feeds via NGT.  Assessment and Plan:   Hepatic encephalopathy: Continue lactulose, and rifaximin. Had 3 bowel movements in the last 24 hours.  Shock: Improved. Multifactorial. Vasopressors off.  Could not rule out sepsis.  Being empirically treated for SBP.  Continue Rocephin and complete 7 day course.  Peritoneal fluid cultures negative.  Liver cirrhosis with portal hypertension ascites status post TIPS  Acute renal failure: Family declines dialysis.  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  Urine output improving with daily lasix challenges.  Strict I's and O's. Repeat lasix challenge today. 1.2L urine output yesterday. Seen by nephrology yesterday and agreed with lasix challenge if continued improvement. Could consider midodrine for hypoperfusion support if family agrees, but currently hemodynamically stable.  Hepatocellular carcinoma status post SBRT: No acute treatment.  HFrEF: EF improved to 55 to 60%, previously about 20%: Holding home guideline directed medical therapy including Entresto, Coreg due to shock state and acute renal failure  COPD: Not in acute exacerbation  Thrombocytopenia, elevated INR: Secondary to liver cirrhosis: Monitor platelets, hemoglobin.  No signs of bleeding  Type 2 diabetes mellitus: Continue SSI.  CBGs every 4 hours  Gastric and duodenal ulcers: Continue PPI  Depression/anxiety: Holding home Lexapro and BuSpar   Other information:    DVT prophylaxis: Heparin subcu Code Status: DNR Family Communication: Discussed with son and daughter in law at [facility] Disposition:   Status  is: Inpatient Remains inpatient appropriate because: Not medically cleared.       Consultants:   Nephrology    Objective:    Vitals:    03/27/22 0300 03/27/22 0745 03/27/22 0800 03/27/22 0900  BP: 134/90 111/63 128/71 106/64  Pulse: (!) 116 (!) 104 (!) 105 (!) 104  Resp: '16 13 19 13  '$ Temp: 98.5 F (36.9 C) 97.7 F (36.5 C)    TempSrc: Axillary Axillary    SpO2: 99% 100% 99% 100%  Weight:      Height:        Intake/Output Summary (Last 24 hours) at 03/27/2022 1101 Last data filed at 03/27/2022 0300 Gross per 24 hour  Intake 60 ml  Output 1200 ml  Net -1140 ml    Filed Weights   03/25/22 0500 03/26/22 0500 03/27/22 0027  Weight: 87.5 kg 87.2 kg 81.9 kg       Physical Exam:   Gen confused, somnolent, mittens bilat in place, more alert can tell me his name but not much else No rash, cyanosis or gangrene Chest clear bilat to bases, no rales/ wheezing RRR no MRG Abd soft ntnd no mass, 2+ nontense ascites, +bs GU normal male, condom cath in place, penile and scrotal edema MS no joint effusions or deformity Ext 1+ pitting dependent hip edema bilat, no wounds or ulcers   Data Reviewed:    I have personally reviewed following labs and imaging studies  CBC: Recent Labs  Lab 03/22/22 1425 03/23/22 0002 03/24/22 0304 03/25/22 0314 03/26/22 0636 03/27/22 0107 03/27/22 0542  WBC 5.1   < > 4.3 5.0 4.5 5.2 5.8  NEUTROABS 4.0  --   --   --   --  SPECIMEN CLOTTED TANYA D, RN NOTIFIED 03/27/2022 0224 BTAYLOR 4.4  HGB 8.7*   < > 7.0* 7.4* 7.3* 8.5* 8.6*  HCT 28.3*   < > 21.5* 21.9* 22.7* 26.4* 25.8*  MCV 98.6   < > 94.7 94.0 96.2 97.1 95.6  PLT 87*   < > 83* 77* 89* 102* 109*   < > = values in this interval not displayed.     Basic Metabolic Panel: Recent Labs  Lab 03/23/22 0417 03/23/22 1009 03/24/22 0304 03/24/22 1238 03/25/22 0314 03/25/22 1710 03/26/22 0636 03/27/22 0107  NA 133*   < > 136 138 138  --  142 145  K 5.6*   < > 5.4* 5.5* 4.8  --  3.6 3.5  CL 105   < > 104 109 109  --  112* 113*  CO2 21*   < > 21* 21* 19*  --  21* 20*  GLUCOSE 179*   < > 152* 117* 106*  --  166* 193*  BUN 53*    < > 56* 57* 57*  --  63* 65*  CREATININE 4.53*   < > 3.66* 3.63* 3.45*  --  3.13* 2.83*  CALCIUM 8.4*   < > 8.8* 8.9 8.7*  --  9.0 9.3  MG 2.1  --  1.9  --  2.0 2.0 1.9  --   PHOS 4.5  --  4.9*  --  5.3* 4.8* 4.4  --    < > = values in this interval not displayed.     GFR: Estimated Creatinine Clearance: 21.1 mL/min (A) (by C-G formula based on SCr of 2.83 mg/dL (H)).  Liver Function Tests: Recent Labs  Lab 03/23/22 0417 03/24/22 0304 03/25/22 0314 03/26/22 0636 03/27/22 0107  AST 360* 193* 141* 108* 102*  ALT 237* 164* 139* 110* 107*  ALKPHOS 349* 305* 327* 322* 350*  BILITOT 1.9* 2.1* 2.1* 1.6* 2.2*  PROT 4.5* 5.3* 4.9* 5.0* 5.2*  ALBUMIN 2.2* 3.2* 2.8* 2.9* 3.1*     CBG: Recent Labs  Lab 03/26/22 1600 03/26/22 1914 03/26/22 2329 03/27/22 0649 03/27/22 0757  GLUCAP 191* 203* 173* 211* 228*      Recent Results (from the past 240 hour(s))  Blood culture (routine x 2)     Status: None   Collection Time: 03/22/22  2:26 PM   Specimen: BLOOD RIGHT ARM  Result Value Ref Range Status   Specimen Description BLOOD RIGHT ARM  Final   Special Requests   Final    Immunocompromised BOTTLES DRAWN AEROBIC AND ANAEROBIC Blood Culture results may not be optimal due to an inadequate volume of blood received in culture bottles   Culture   Final    NO GROWTH 5 DAYS Performed at [facility], [address] [place], [place] [address]    Report Status 03/27/2022 FINAL  Final  Blood culture (routine x 2)     Status: None   Collection Time: 03/22/22  2:31 PM   Specimen: BLOOD LEFT WRIST  Result Value Ref Range Status   Specimen Description BLOOD LEFT WRIST  Final   Special Requests   Final    Immunocompromised BOTTLES DRAWN AEROBIC AND ANAEROBIC Blood Culture adequate volume   Culture   Final    NO GROWTH 5 DAYS Performed at [facility], [address] [place], [place] [address]    Report Status 03/27/2022 FINAL  Final  MRSA Next Gen by PCR, Nasal     Status: None    Collection Time: 03/23/22 12:07 AM  Result Value Ref Range Status   MRSA by PCR Next Gen NOT DETECTED NOT DETECTED Final    Comment: (NOTE) The GeneXpert MRSA Assay (FDA approved for NASAL specimens only), is one component of a comprehensive MRSA colonization surveillance program. It is not intended to diagnose MRSA infection nor to guide or monitor treatment for MRSA infections. Test performance is not FDA approved in patients less than 58 years old. Performed at [facility], [place] [address] [place], [place] [address]   Urine Culture     Status: None   Collection Time: 03/23/22  2:02 PM   Specimen: Urine, Catheterized  Result Value Ref Range Status   Specimen Description URINE, CATHETERIZED  Final   Special Requests NONE  Final   Culture   Final    NO GROWTH Performed at [facility], [address] [place], [place] [address]    Report Status 03/24/2022 FINAL  Final  Body fluid culture w Gram Stain     Status: None   Collection Time: 03/23/22  2:46 PM   Specimen: Body Fluid  Result Value Ref Range Status   Specimen Description FLUID PARACENTESIS  Final   Special Requests NONE  Final   Gram Stain NO WBC SEEN NO ORGANISMS SEEN   Final   Culture   Final    NO GROWTH 3 DAYS Performed at [facility], [address], [place], [place] [address]    Report Status 03/26/2022 FINAL  Final         Radiology Studies:    No results found.      Medications:    Scheduled Meds:  Chlorhexidine Gluconate Cloth  6 each Topical Daily   feeding supplement (PROSource TF)  45 mL Per Tube  TID   heparin  5,000 Units Subcutaneous Q8H   insulin aspart  0-9 Units Subcutaneous Q4H   lactulose  30 g Per Tube TID   mouth rinse  15 mL Mouth Rinse BID   pantoprazole (PROTONIX) IV  40 mg Intravenous Q12H   rifaximin  550 mg Per Tube BID   Continuous Infusions:  sodium chloride Stopped (03/25/22 1154)   cefTRIAXone (ROCEPHIN)  IV     feeding supplement (OSMOLITE  1.5 CAL) 1,000 mL (03/26/22 1405)       LOS: 5 days    Time spent: 55 minutes    Leslee Home, MD [facility]   To contact the attending provider between 7A-7P or the covering provider during after hours 7P-7A, please log into the web site [email or web address] and access using universal [place] password for that web site. If you do not have the password, please call the hospital operator.  03/27/2022, 11:01 AM

## 2022-03-27 NOTE — Progress Notes (Signed)
   03/27/22 1200  Assess: MEWS Score  BP (!) 142/76  MAP (mmHg) 93  Pulse Rate (!) 113  ECG Heart Rate (!) 113  Resp (!) 22  Level of Consciousness Alert  SpO2 99 %  O2 Device Room Air  Patient Activity (if Appropriate) In bed  Assess: if the MEWS score is Yellow or Red  Were vital signs taken at a resting state? Yes  Focused Assessment No change from prior assessment  Does the patient meet 2 or more of the SIRS criteria? Yes  Does the patient have a confirmed or suspected source of infection? No  MEWS guidelines implemented *See Row Information* No, previously red, continue vital signs every 4 hours  Notify: Charge Nurse/RN  Name of Charge Nurse/RN Notified G[place]t  Patient Outcome Other (Comment) (stable remains on the unit)  Progress note created (see row info) Yes  Assess: SIRS CRITERIA  SIRS Temperature  0  SIRS Pulse 1  SIRS Respirations  1  SIRS WBC 0  SIRS Score Sum  2

## 2022-03-28 ENCOUNTER — Inpatient Hospital Stay: Payer: Medicare HMO | Admitting: Hematology

## 2022-03-28 DIAGNOSIS — K7469 Other cirrhosis of liver: Secondary | ICD-10-CM | POA: Diagnosis not present

## 2022-03-28 DIAGNOSIS — I959 Hypotension, unspecified: Secondary | ICD-10-CM | POA: Diagnosis not present

## 2022-03-28 DIAGNOSIS — E860 Dehydration: Secondary | ICD-10-CM | POA: Diagnosis not present

## 2022-03-28 DIAGNOSIS — Z7189 Other specified counseling: Secondary | ICD-10-CM

## 2022-03-28 DIAGNOSIS — N179 Acute kidney failure, unspecified: Secondary | ICD-10-CM | POA: Diagnosis not present

## 2022-03-28 LAB — CBC WITH DIFFERENTIAL/PLATELET
Abs Immature Granulocytes: 0.09 10*3/uL — ABNORMAL HIGH (ref 0.00–0.07)
Basophils Absolute: 0 10*3/uL (ref 0.0–0.1)
Basophils Relative: 0 %
Eosinophils Absolute: 0.2 10*3/uL (ref 0.0–0.5)
Eosinophils Relative: 2 %
HCT: 26.5 % — ABNORMAL LOW (ref 39.0–52.0)
Hemoglobin: 8.7 g/dL — ABNORMAL LOW (ref 13.0–17.0)
Immature Granulocytes: 1 %
Lymphocytes Relative: 12 %
Lymphs Abs: 0.8 10*3/uL (ref 0.7–4.0)
MCH: 31.9 pg (ref 26.0–34.0)
MCHC: 32.8 g/dL (ref 30.0–36.0)
MCV: 97.1 fL (ref 80.0–100.0)
Monocytes Absolute: 0.7 10*3/uL (ref 0.1–1.0)
Monocytes Relative: 11 %
Neutro Abs: 4.7 10*3/uL (ref 1.7–7.7)
Neutrophils Relative %: 74 %
Platelets: 101 10*3/uL — ABNORMAL LOW (ref 150–400)
RBC: 2.73 MIL/uL — ABNORMAL LOW (ref 4.22–5.81)
RDW: 23.4 % — ABNORMAL HIGH (ref 11.5–15.5)
WBC: 6.4 10*3/uL (ref 4.0–10.5)
nRBC: 0.6 % — ABNORMAL HIGH (ref 0.0–0.2)

## 2022-03-28 LAB — COMPREHENSIVE METABOLIC PANEL
ALT: 86 U/L — ABNORMAL HIGH (ref 0–44)
AST: 87 U/L — ABNORMAL HIGH (ref 15–41)
Albumin: 2.9 g/dL — ABNORMAL LOW (ref 3.5–5.0)
Alkaline Phosphatase: 350 U/L — ABNORMAL HIGH (ref 38–126)
Anion gap: 10 (ref 5–15)
BUN: 62 mg/dL — ABNORMAL HIGH (ref 8–23)
CO2: 23 mmol/L (ref 22–32)
Calcium: 9.3 mg/dL (ref 8.9–10.3)
Chloride: 114 mmol/L — ABNORMAL HIGH (ref 98–111)
Creatinine, Ser: 2.13 mg/dL — ABNORMAL HIGH (ref 0.61–1.24)
GFR, Estimated: 31 mL/min — ABNORMAL LOW (ref >60)
Glucose, Bld: 201 mg/dL — ABNORMAL HIGH (ref 70–99)
Potassium: 3.3 mmol/L — ABNORMAL LOW (ref 3.5–5.1)
Sodium: 147 mmol/L — ABNORMAL HIGH (ref 135–145)
Total Bilirubin: 2.3 mg/dL — ABNORMAL HIGH (ref 0.3–1.2)
Total Protein: 5.3 g/dL — ABNORMAL LOW (ref 6.5–8.1)

## 2022-03-28 LAB — PROTIME-INR
INR: 1.6 — ABNORMAL HIGH (ref 0.8–1.2)
Prothrombin Time: 18.6 seconds — ABNORMAL HIGH (ref 11.4–15.2)

## 2022-03-28 LAB — GLUCOSE, CAPILLARY
Glucose-Capillary: 247 mg/dL — ABNORMAL HIGH (ref 70–99)
Glucose-Capillary: 232 mg/dL — ABNORMAL HIGH (ref 70–99)
Glucose-Capillary: 168 mg/dL — ABNORMAL HIGH (ref 70–99)
Glucose-Capillary: 209 mg/dL — ABNORMAL HIGH (ref 70–99)
Glucose-Capillary: 204 mg/dL — ABNORMAL HIGH (ref 70–99)

## 2022-03-28 LAB — APTT: aPTT: 48 seconds — ABNORMAL HIGH (ref 24–36)

## 2022-03-28 LAB — MAGNESIUM: Magnesium: 1.7 mg/dL (ref 1.7–2.4)

## 2022-03-28 LAB — PHOSPHORUS: Phosphorus: 2.7 mg/dL (ref 2.5–4.6)

## 2022-03-28 MED ORDER — GERHARDT'S BUTT CREAM
TOPICAL_CREAM | Freq: Two times a day (BID) | CUTANEOUS | Status: DC
  Administered 2022-03-31 – 2022-04-06 (×2): 1 via TOPICAL
  Filled 2022-03-28 (×2): qty 1

## 2022-03-28 MED ORDER — POTASSIUM CHLORIDE 20 MEQ PO PACK
40.0000 meq | PACK | Freq: Once | ORAL | Status: AC
  Administered 2022-03-28: 40 meq via ORAL
  Filled 2022-03-28: qty 2

## 2022-03-28 MED ORDER — INSULIN GLARGINE-YFGN 100 UNIT/ML ~~LOC~~ SOLN
5.0000 [IU] | Freq: Every day | SUBCUTANEOUS | Status: DC
Start: 2022-03-28 — End: 2022-04-04
  Administered 2022-03-28 – 2022-04-04 (×8): 5 [IU] via SUBCUTANEOUS
  Filled 2022-03-28 (×8): qty 0.05

## 2022-03-28 MED ORDER — DEXTROSE 5 % IV SOLN
120.0000 mg | Freq: Once | INTRAVENOUS | Status: AC
  Administered 2022-03-28: 120 mg via INTRAVENOUS
  Filled 2022-03-28: qty 10

## 2022-03-28 MED ORDER — LACTULOSE 10 GM/15ML PO SOLN
20.0000 g | Freq: Three times a day (TID) | ORAL | Status: DC
Start: 2022-03-28 — End: 2022-04-01
  Administered 2022-03-28 – 2022-03-31 (×11): 20 g
  Administered 2022-04-01: 10 g
  Filled 2022-03-28 (×12): qty 30

## 2022-03-28 MED ORDER — LORAZEPAM 2 MG/ML IJ SOLN
1.0000 mg | Freq: Four times a day (QID) | INTRAMUSCULAR | Status: DC | PRN
  Filled 2022-03-28 (×2): qty 1

## 2022-03-28 MED ORDER — MAGNESIUM SULFATE 2 GM/50ML IV SOLN
2.0000 g | Freq: Once | INTRAVENOUS | Status: AC
  Administered 2022-03-28: 2 g via INTRAVENOUS
  Filled 2022-03-28: qty 50

## 2022-03-28 NOTE — Progress Notes (Signed)
TRH night cross cover note:   I was notified by RN of the patient's frequent diarrhea and concern for associated skin breakdown, with corresponding request for rectal tube as well as request for barrier cream.  I subsequently placed order for Flexiseal as well as scheduled Gerhardt's butt cream bid.     Babs Bertin, DO Hospitalist

## 2022-03-28 NOTE — Evaluation (Signed)
Clinical/Bedside Swallow Evaluation Patient Details  Name: [name] MRN: [id number] Date of Birth: Mar 08, 1941  Today's Date: 03/28/2022 Time: SLP Start Time (ACUTE ONLY): 41 SLP Stop Time (ACUTE ONLY): 5631 SLP Time Calculation (min) (ACUTE ONLY): 10 min  Past Medical History:  Past Medical History:  Diagnosis Date   Acute renal failure ([place])    Aortic atherosclerosis ([place])    Benign prostatic hypertrophy    Blood transfusion without reported diagnosis    CHF (congestive heart failure) (HCC)    a. EF 20-25% by echo in 11/2019   COPD (chronic obstructive pulmonary disease) ([place]) 12/15/2009   FEV1 2.30 (70%) ratio 63 no better with B2 and DLCO 18/6 (73%) corrects to 106%   Degenerative joint disease    Depression    Diverticulosis    Duodenal varices    Esophageal varices (HCC)    Essential hypertension    ACE inhibitor cough   Fatty liver    Gastric ulcer    Hepatic cirrhosis (HCC)    Hepatocellular carcinoma (HCC)    Hyperlipidemia    IDA (iron deficiency anemia)    Internal hemorrhoids    Liver cancer ([place])    Low back pain    Otitis externa 07/30/2012   Pneumonia    Portal hypertensive gastropathy (HCC)    SBP (spontaneous bacterial peritonitis) ([place])    Sepsis ([place])    Splenomegaly    Thrombocytopenia (HCC)    Type 2 diabetes mellitus ([place])    no meds   Ulcer of foot ([place])    Right foot   Past Surgical History:  Past Surgical History:  Procedure Laterality Date   BIOPSY  07/14/2021   Procedure: BIOPSY;  Surgeon: Daryel November, MD;  Location: [place];  Service: Gastroenterology;;  EGD and COLON   BIOPSY  02/03/2022   Procedure: BIOPSY;  Surgeon: Mauri Pole, MD;  Location: [facility] ENDOSCOPY;  Service: Gastroenterology;;   COLONOSCOPY  2020   JMP-MAC-plenvu (good)-polyps-recall 1 yr   COLONOSCOPY WITH PROPOFOL N/A 07/14/2021   Procedure: COLONOSCOPY WITH PROPOFOL;  Surgeon: Daryel November, MD;  Location: [facility] ENDOSCOPY;  Service:  Gastroenterology;  Laterality: N/A;   ESOPHAGOGASTRODUODENOSCOPY (EGD) WITH PROPOFOL N/A 07/14/2021   Procedure: ESOPHAGOGASTRODUODENOSCOPY (EGD) WITH PROPOFOL;  Surgeon: Daryel November, MD;  Location: [place];  Service: Gastroenterology;  Laterality: N/A;   ESOPHAGOGASTRODUODENOSCOPY (EGD) WITH PROPOFOL N/A 02/03/2022   Procedure: ESOPHAGOGASTRODUODENOSCOPY (EGD) WITH PROPOFOL;  Surgeon: Mauri Pole, MD;  Location: [facility];  Service: Gastroenterology;  Laterality: N/A;   EYE SURGERY     HEMOSTASIS CLIP PLACEMENT  02/03/2022   Procedure: HEMOSTASIS CLIP PLACEMENT;  Surgeon: Mauri Pole, MD;  Location: [facility];  Service: Gastroenterology;;   IR EMBO TUMOR ORGAN ISCHEMIA INFARCT INC GUIDE ROADMAPPING  06/01/2020   IR INTRAVASCULAR ULTRASOUND NON CORONARY  03/18/2022   IR PARACENTESIS  08/30/2021   IR PARACENTESIS  09/22/2021   IR PARACENTESIS  10/12/2021   IR PARACENTESIS  11/02/2021   IR PARACENTESIS  11/15/2021   IR PARACENTESIS  11/30/2021   IR PARACENTESIS  12/14/2021   IR PARACENTESIS  12/27/2021   IR PARACENTESIS  01/04/2022   IR PARACENTESIS  01/12/2022   IR PARACENTESIS  01/19/2022   IR PARACENTESIS  02/02/2022   IR PARACENTESIS  02/16/2022   IR PARACENTESIS  02/28/2022   IR PARACENTESIS  03/15/2022   IR PARACENTESIS  03/18/2022   IR RADIOLOGIST EVAL & MGMT  04/28/2020   IR RADIOLOGIST EVAL & MGMT  07/15/2020   IR RADIOLOGIST EVAL & MGMT  05/04/2021   IR TIPS  03/18/2022   IR US GUIDE VASC ACCESS RIGHT  03/18/2022   IR US GUIDE VASC ACCESS RIGHT  03/18/2022   RADIOLOGY WITH ANESTHESIA N/A 03/18/2022   Procedure: TIPS;  Surgeon: Suzette Battiest, MD;  Location: [place];  Service: Radiology;  Laterality: N/A;   HPI:  Pt is an 81 year old male who presented to the ED for weakness, decreased mobility and poor PO intake s/p fall 3 days PTA. Pt admitted with shock, AKI;  s/p paracentesis with 20 ml fluid removed. Cortrak placed 6/16 on which date pt documented  to be minimall responsive. Per referring MD's note 6/19, pt's family considering comfort care if there is further decline in clinical statu and overall prognosis judged to be poor; PMT consulted 6/19. PMH: HTN, HLD, mild AS, HFrEF (EF as low as 20-25%, recovered with Echo 02/2022 EF 55-60%, G1DD), COPD, prior tobacco use, T2DM, NASH cirrhosis with portal hypertension as evidenced by EV, PHG, ascites requiring frequent paracentesis (c/b SBP) and HCC (s/p radiation), gastric/duodenal ulcers and diverticulosis, IDA. Pt recently underwent TIPS placement with IR 6/9, monitored x 24H and discharged home 6/10.    Assessment / Plan / Recommendation  Clinical Impression  Pt was seen for bedside swallow evaluation with his family (i.e., wife, brother, and daughter) present who denied the pt having a history of dysphagia. Pt was edentulous and pt's family reported that his dentures are currently at home. Pt was lethargic during the evaluation and pt's RN attributed this to his receipt of Ativan this afternoon. The impact of this on his performance is strongly considered. Pt demonstrated limited lingual/labial manipulation of a moistened oral swab, but bolus manipulation was absent with ice chips which was ultimately removed; additional boluses were deferred. Pt does not present as a candidate for safe oral intake. It is recommended that his NPO status be maintained with continued use of enteral nutrition. SLP Visit Diagnosis: Dysphagia, unspecified (R13.10)    Aspiration Risk  Moderate aspiration risk    Diet Recommendation NPO;Alternative means - temporary   Medication Administration: Via alternative means    Other  Recommendations Oral Care Recommendations: Oral care QID    Recommendations for follow up therapy are one component of a multi-disciplinary discharge planning process, led by the attending physician.  Recommendations may be updated based on patient status, additional functional criteria and  insurance authorization.  Follow up Recommendations  (TBD)      Assistance Recommended at Discharge    Functional Status Assessment Patient has had a recent decline in their functional status and demonstrates the ability to make significant improvements in function in a reasonable and predictable amount of time.  Frequency and Duration min 2x/week  2 weeks       Prognosis Prognosis for Safe Diet Advancement: Fair Barriers to Reach Goals:  (alertness)      Swallow Study   General Date of Onset: 03/22/22 HPI: Pt is an 81 year old male who presented to the ED for weakness, decreased mobility and poor PO intake s/p fall 3 days PTA. Pt admitted with shock, AKI;  s/p paracentesis with 20 ml fluid removed. Cortrak placed 6/16 on which date pt documented to be minimall responsive. Per referring MD's note 6/19, pt's family considering comfort care if there is further decline in clinical statu and overall prognosis judged to be poor; PMT consulted 6/19. PMH: HTN, HLD, mild AS, HFrEF (EF as low as 20-25%, recovered with  Echo 02/2022 EF 55-60%, G1DD), COPD, prior tobacco use, T2DM, NASH cirrhosis with portal hypertension as evidenced by EV, PHG, ascites requiring frequent paracentesis (c/b SBP) and HCC (s/p radiation), gastric/duodenal ulcers and diverticulosis, IDA. Pt recently underwent TIPS placement with IR 6/9, monitored x 24H and discharged home 6/10. Type of Study: Bedside Swallow Evaluation Previous Swallow Assessment: none Diet Prior to this Study: NPO;NG Tube Temperature Spikes Noted: No Respiratory Status: Room air History of Recent Intubation: No Behavior/Cognition: Cooperative;Pleasant mood Oral Cavity Assessment: Within Functional Limits Oral Care Completed by SLP: No Oral Cavity - Dentition: Edentulous;Dentures, top;Dentures, bottom (dentures at home) Self-Feeding Abilities: Total assist Patient Positioning: Upright in bed;Postural control adequate for testing Volitional Cough:  Cognitively unable to elicit Volitional Swallow: Unable to elicit    Oral/Motor/Sensory Function Overall Oral Motor/Sensory Function:  (UTA)   Ice Chips Ice chips: Impaired Presentation: Spoon Oral Phase Impairments: Poor awareness of bolus   Thin Liquid Thin Liquid: Impaired Presentation:  (moistened oral swab) Oral Phase Impairments: Poor awareness of bolus    Nectar Thick Nectar Thick Liquid: Not tested   Honey Thick Honey Thick Liquid: Not tested   Puree Puree: Not tested   Solid     Solid: Not tested     Deva Ron I. Hardin Negus, [place], [place] Office number [phone] Pager [phone]  Horton Marshall 03/28/2022,3:55 PM

## 2022-03-28 NOTE — Plan of Care (Signed)
  Problem: Cardiovascular: Goal: Vascular access site(s) Level 0-1 will be maintained Outcome: Progressing   Problem: Clinical Measurements: Goal: Respiratory complications will improve Outcome: Progressing   Problem: Nutrition: Goal: Adequate nutrition will be maintained Outcome: Progressing   Problem: Elimination: Goal: Will not experience complications related to urinary retention Outcome: Progressing   Problem: Pain Managment: Goal: General experience of comfort will improve Outcome: Progressing   Problem: Nutritional: Goal: Maintenance of adequate nutrition will improve Outcome: Progressing   Problem: Activity: Goal: Ability to return to baseline activity level will improve Outcome: Not Progressing   Problem: Health Behavior/Discharge Planning: Goal: Ability to safely manage health-related needs after discharge will improve Outcome: Not Progressing   Problem: Activity: Goal: Risk for activity intolerance will decrease Outcome: Not Progressing   Problem: Coping: Goal: Level of anxiety will decrease Outcome: Not Progressing   Problem: Elimination: Goal: Will not experience complications related to bowel motility Outcome: Not Progressing   Problem: Coping: Goal: Ability to adjust to condition or change in health will improve Outcome: Not Progressing

## 2022-03-28 NOTE — Progress Notes (Incomplete)
HEMATOLOGY/ONCOLOGY PHONE VISIT NOTE  Date of Service: 03/28/2022  Patient Care Team: [name], MD as PCP - General (Family Medicine) Satira Sark, MD as PCP - Cardiology (Cardiology) Marilynne Halsted, MD as Referring Physician (Ophthalmology) Brunetta Genera, MD as Consulting Physician (Hematology) Pyrtle, Lajuan Lines, MD as Consulting Physician (Gastroenterology) Marzetta Board, DPM as Consulting Physician (Podiatry) [name], [facility] (Pharmacist) Suttle, Rosanne Ashing, MD as Attending Physician (Interventional Radiology)  CHIEF COMPLAINTS/PURPOSE OF CONSULTATION:  F/u for continued evaluation and management of [name] and anemia.   HISTORY OF PRESENTING ILLNESS:  Please see previous note for details on initial presentation  INTERVAL HISTORY: I connected with [name] on 03/28/2022 at 3:30 PM EST by telephone visit and verified that I am speaking with the correct person using two identifiers.   I discussed the limitations, risks, security and privacy concerns of performing an evaluation and management service by telemedicine and the availability of in-person appointments. I also discussed with the patient that there may be a patient responsible charge related to this service. The patient expressed understanding and agreed to proceed.   Other persons participating in the visit and their role in the encounter: None  Patient's location: Home Provider's location: [place] is a 81 y.o. male who was contacted via phone for f/u for continued evaluation and management of [name] and anemia and to follow up after recent hospitalization. He reports He is doing well.  ***  He notes no new GI bleeding since previous visit.  No fever, chills, night sweats. No new lumps, bumps, or lesions/rashes. No abdominal pain, distension or change in bowel habits. No new or unexpected weight loss. No SOB or chest pain. No other new or acute  focal symptoms.  MRI of liver done 03/16/2022 revealed no MRI evidence of disease progression.  Lab results from today were discussed in detail with the patient.  MEDICAL HISTORY:  Past Medical History:  Diagnosis Date   Acute renal failure ([place])    Aortic atherosclerosis ([place])    Benign prostatic hypertrophy    Blood transfusion without reported diagnosis    CHF (congestive heart failure) ([place])    a. EF 20-25% by echo in 11/2019   COPD (chronic obstructive pulmonary disease) ([place]) 12/15/2009   FEV1 2.30 (70%) ratio 63 no better with B2 and DLCO 18/6 (73%) corrects to 106%   Degenerative joint disease    Depression    Diverticulosis    Duodenal varices    Esophageal varices (HCC)    Essential hypertension    ACE inhibitor cough   Fatty liver    Gastric ulcer    Hepatic cirrhosis (HCC)    Hepatocellular carcinoma (HCC)    Hyperlipidemia    IDA (iron deficiency anemia)    Internal hemorrhoids    Liver cancer ([place])    Low back pain    Otitis externa 07/30/2012   Pneumonia    Portal hypertensive gastropathy (HCC)    SBP (spontaneous bacterial peritonitis) ([place])    Sepsis ([place])    Splenomegaly    Thrombocytopenia (HCC)    Type 2 diabetes mellitus ([place])    no meds   Ulcer of foot ([place])    Right foot    SURGICAL HISTORY: Past Surgical History:  Procedure Laterality Date   BIOPSY  07/14/2021   Procedure: BIOPSY;  Surgeon: Daryel November, MD;  Location: [facility] ENDOSCOPY;  Service: Gastroenterology;;  EGD and COLON  BIOPSY  02/03/2022   Procedure: BIOPSY;  Surgeon: Mauri Pole, MD;  Location: [facility] ENDOSCOPY;  Service: Gastroenterology;;   COLONOSCOPY  2020   JMP-MAC-plenvu (good)-polyps-recall 1 yr   COLONOSCOPY WITH PROPOFOL N/A 07/14/2021   Procedure: COLONOSCOPY WITH PROPOFOL;  Surgeon: Daryel November, MD;  Location: [place];  Service: Gastroenterology;  Laterality: N/A;   ESOPHAGOGASTRODUODENOSCOPY (EGD) WITH PROPOFOL N/A 07/14/2021   Procedure:  ESOPHAGOGASTRODUODENOSCOPY (EGD) WITH PROPOFOL;  Surgeon: Daryel November, MD;  Location: [place];  Service: Gastroenterology;  Laterality: N/A;   ESOPHAGOGASTRODUODENOSCOPY (EGD) WITH PROPOFOL N/A 02/03/2022   Procedure: ESOPHAGOGASTRODUODENOSCOPY (EGD) WITH PROPOFOL;  Surgeon: Mauri Pole, MD;  Location: [facility] ENDOSCOPY;  Service: Gastroenterology;  Laterality: N/A;   EYE SURGERY     HEMOSTASIS CLIP PLACEMENT  02/03/2022   Procedure: HEMOSTASIS CLIP PLACEMENT;  Surgeon: Mauri Pole, MD;  Location: [facility] ENDOSCOPY;  Service: Gastroenterology;;   IR EMBO TUMOR ORGAN ISCHEMIA INFARCT INC GUIDE ROADMAPPING  06/01/2020   IR INTRAVASCULAR ULTRASOUND NON CORONARY  03/18/2022   IR PARACENTESIS  08/30/2021   IR PARACENTESIS  09/22/2021   IR PARACENTESIS  10/12/2021   IR PARACENTESIS  11/02/2021   IR PARACENTESIS  11/15/2021   IR PARACENTESIS  11/30/2021   IR PARACENTESIS  12/14/2021   IR PARACENTESIS  12/27/2021   IR PARACENTESIS  01/04/2022   IR PARACENTESIS  01/12/2022   IR PARACENTESIS  01/19/2022   IR PARACENTESIS  02/02/2022   IR PARACENTESIS  02/16/2022   IR PARACENTESIS  02/28/2022   IR PARACENTESIS  03/15/2022   IR PARACENTESIS  03/18/2022   IR RADIOLOGIST EVAL & MGMT  04/28/2020   IR RADIOLOGIST EVAL & MGMT  07/15/2020   IR RADIOLOGIST EVAL & MGMT  05/04/2021   IR TIPS  03/18/2022   IR US GUIDE VASC ACCESS RIGHT  03/18/2022   IR US GUIDE VASC ACCESS RIGHT  03/18/2022   RADIOLOGY WITH ANESTHESIA N/A 03/18/2022   Procedure: TIPS;  Surgeon: Suzette Battiest, MD;  Location: [place];  Service: Radiology;  Laterality: N/A;    SOCIAL HISTORY: Social History   Socioeconomic History   Marital status: Unknown    Spouse name: Not on file   Number of children: Not on file   Years of education: Not on file   Highest education level: Not on file  Occupational History   Occupation: retired    Comment: maintenence work  Tobacco Use   Smoking status: Former    Packs/day: 4.00     Years: 20.00    Total pack years: 80.00    Types: Cigarettes    Quit date: 09/18/1984    Years since quitting: 37.5   Smokeless tobacco: Never   Tobacco comments:    quit in [place] Use   Vaping Use: Never used  Substance and Sexual Activity   Alcohol use: No    Comment: no drinking since 40 years plus   Drug use: No   Sexual activity: Yes  Other Topics Concern   Not on file  Social History Narrative   ** Merged History Encounter **       Married 53 years in 2015. 4 kids (2 sons) and 3 grandkids.    Lived in [place], [place] wholel life      Retired from [employer], going to [employer] where they have a Environmental education officer  Resource Strain: Low Risk  (09/10/2021)   Overall Financial Resource Strain (CARDIA)    Difficulty of Paying Living Expenses: Not hard at all  Food Insecurity: No Food Insecurity (09/10/2021)   Hunger Vital Sign    Worried About Running Out of Food in the Last Year: Never true    Ran Out of Food in the Last Year: Never true  Transportation Needs: No Transportation Needs (09/10/2021)   PRAPARE - Hydrologist (Medical): No    Lack of Transportation (Non-Medical): No  Physical Activity: Inactive (09/10/2021)   Exercise Vital Sign    Days of Exercise per Week: 0 days    Minutes of Exercise per Session: 0 min  Stress: No Stress Concern Present (09/10/2021)   [place]    Feeling of Stress : Only a little  Social Connections: Moderately Integrated (09/10/2021)   Social Connection and Isolation Panel [NHANES]    Frequency of Communication with Friends and Family: More than three times a week    Frequency of Social Gatherings with Friends and Family: More than three times a week    Attends Religious Services: More than 4 times per year    Active Member of [employer] or Organizations: No    Attends Theatre manager Meetings: Never    Marital Status: Married  Human resources officer Violence: Not At Risk (09/10/2021)   Humiliation, Afraid, Rape, and Kick questionnaire    Fear of Current or Ex-Partner: No    Emotionally Abused: No    Physically Abused: No    Sexually Abused: No    FAMILY HISTORY: Family History  Problem Relation Age of Onset   Melanoma Mother    Stroke Father    CAD Brother        Premature disease   Colon polyps Neg Hx    Colon cancer Neg Hx    Esophageal cancer Neg Hx    Stomach cancer Neg Hx    Rectal cancer Neg Hx     ALLERGIES:  has No Known Allergies.  MEDICATIONS:  No current facility-administered medications for this visit.   No current outpatient medications on file.   Facility-Administered Medications Ordered in Other Visits  Medication Dose Route Frequency Provider Last Rate Last Admin   0.9 %  sodium chloride infusion  250 mL Intravenous Continuous Trifan, Carola Rhine, MD   Stopped at 03/25/22 1154   albuterol (PROVENTIL) (2.5 MG/3ML) 0.083% nebulizer solution 2.5 mg  2.5 mg Nebulization Q4H PRN [place] Crane M, PA-C       cefTRIAXone (ROCEPHIN) 2 g in sodium chloride 0.9 % 100 mL IVPB  2 g Intravenous QHS Imagene Sheller S, DO   Stopped at 03/27/22 2250   feeding supplement (OSMOLITE 1.5 CAL) liquid 1,000 mL  1,000 mL Per Tube Continuous Juanito Doom, MD 50 mL/hr at 03/28/22 0415 Infusion Verify at 03/28/22 0415   feeding supplement (PROSource TF) liquid 45 mL  45 mL Per Tube TID Simonne Maffucci B, MD   45 mL at 03/28/22 1017   fentaNYL (SUBLIMAZE) injection 12.5 mcg  12.5 mcg Intravenous Once PRN Corey Harold, NP       Gerhardt's butt cream   Topical BID Howerter, Justin B, DO   Given at 03/28/22 1016   heparin injection 5,000 Units  5,000 Units Subcutaneous Q8H [place] Crane M, PA-C   5,000 Units at 03/28/22 0557   insulin aspart (novoLOG) injection 0-9 Units  0-9  Units Subcutaneous Q4H [place] Crane M, [place]   2 Units at 03/28/22 1204    insulin glargine-yfgn (SEMGLEE) injection 5 Units  5 Units Subcutaneous Daily Gwynne Edinger, MD   5 Units at 03/28/22 1245   lactulose (CHRONULAC) 10 GM/15ML solution 20 g  20 g Per Tube TID Wouk, Ailene Rud, MD       LORazepam (ATIVAN) injection 1 mg  1 mg Intravenous Q6H PRN Wouk, Ailene Rud, MD       MEDLINE mouth rinse  15 mL Mouth Rinse BID Collier Bullock, MD   15 mL at 03/28/22 1036   ondansetron (ZOFRAN) injection 4 mg  4 mg Intravenous Q6H PRN Anders Simmonds, MD   4 mg at 03/23/22 2229   Oral care mouth rinse  15 mL Mouth Rinse PRN Candee Furbish, MD       oxyCODONE (Oxy IR/ROXICODONE) immediate release tablet 5 mg  5 mg Per Tube Q6H PRN Corey Harold, NP   5 mg at 03/28/22 0138   pantoprazole (PROTONIX) injection 40 mg  40 mg Intravenous Q12H Corey Harold, NP   40 mg at 03/28/22 1017   rifaximin (XIFAXAN) tablet 550 mg  550 mg Per Tube BID Simonne Maffucci B, MD   550 mg at 03/28/22 1017    PHYSICAL EXAMINATION: There were no vitals filed for this visit.  NAD GENERAL:alert, in no acute distress and comfortable SKIN: no acute rashes, no significant lesions EYES: conjunctiva are pink and non-injected, sclera anicteric NECK: supple, no JVD LYMPH:  no palpable lymphadenopathy in the cervical, axillary or inguinal regions LUNGS: clear to auscultation b/l with normal respiratory effort HEART: regular rate & rhythm ABDOMEN:  normoactive bowel sounds , non tender, not distended. Extremity: no pedal edema PSYCH: alert & oriented x 3 with fluent speech NEURO: no focal motor/sensory deficits  Exam performed in chair.  LABORATORY DATA:  I have reviewed the data as listed .    Latest Ref Rng & Units 03/28/2022    1:33 AM 03/27/2022    5:42 AM 03/27/2022    1:07 AM  CBC  WBC 4.0 - 10.5 K/uL 6.4  5.8  5.2   Hemoglobin 13.0 - 17.0 g/dL 8.7  8.6  8.5   Hematocrit 39.0 - 52.0 % 26.5  25.8  26.4   Platelets 150 - 400 K/uL 101  109  102    .    Latest Ref Rng & Units  03/28/2022    1:33 AM 03/27/2022    1:07 AM 03/26/2022    6:36 AM  CMP  Glucose 70 - 99 mg/dL 201  193  166   BUN 8 - 23 mg/dL 62  65  63   Creatinine 0.61 - 1.24 mg/dL 2.13  2.83  3.13   Sodium 135 - 145 mmol/L 147  145  142   Potassium 3.5 - 5.1 mmol/L 3.3  3.5  3.6   Chloride 98 - 111 mmol/L 114  113  112   CO2 22 - 32 mmol/L _0 Calcium 8.9 - 10.3 mg/dL 9.3  9.3  9.0   Total Protein 6.5 - 8.1 g/dL 5.3  5.2  5.0   Total Bilirubin 0.3 - 1.2 mg/dL 2.3  2.2  1.6   Alkaline Phos 38 - 126 U/L 350  350  322   AST 15 - 41 U/L 87  102  108   ALT 0 - 44 U/L 86  107  110        02/28/18 Surgical Pathology:   02/28/18 Cytogenetics:    RADIOGRAPHIC STUDIES: I have personally reviewed the radiological images as listed and agreed with the findings in the report.  MR Abdomen W Wo Contrast  Result Date: 11/15/2021 CLINICAL DATA:  Follow-up hepatocellular carcinoma. Evaluate treatment response. EXAM: MRI ABDOMEN WITHOUT AND WITH CONTRAST TECHNIQUE: Multiplanar multisequence MR imaging of the abdomen was performed both before and after the administration of intravenous contrast. CONTRAST:  17m GADAVIST GADOBUTROL 1 MMOL/ML IV SOLN COMPARISON:  07/01/2021 FINDINGS: Lower chest: No acute findings. Hepatobiliary: Hepatic cirrhosis again demonstrated. Post treatment changes again seen in segment 4. A lesion showing thin peripheral rim enhancement is again seen in segment 4A of the left hepatic lobe, measuring 2.0 x 1.2 cm on image 37/20. This is unchanged since previous study. A small lesion in segment 3 also shows mild peripheral enhancement. This measures 10 x 9 mm on image 43/27, and shows decreased size and enhancement compared to previous study when it measured 1.6 x 1.5 cm. No new or enlarging liver masses identified. Prior cholecystectomy. No evidence of biliary obstruction. Pancreas: No mass or inflammatory changes. Spleen: Stable mild splenomegaly with length measuring approximately 14 cm.  Stable tiny benign cyst again seen in the anterior aspect of the spleen. Adrenals/Urinary Tract: No masses identified. Stable small right renal cyst. No evidence of hydronephrosis. Stomach/Bowel: Diverticulosis involving visualized portion of ascending and transverse colon, without evidence of diverticulitis in these regions. Otherwise unremarkable. Vascular/Lymphatic: No pathologically enlarged lymph nodes identified. Left upper quadrant portosystemic venous collaterals again seen, consistent with portal venous hypertension. No acute vascular findings. Other: Moderate ascites, without significant change since prior exam. Musculoskeletal:  No suspicious bone lesions identified. IMPRESSION: Stable treated lesion in segment 4A of the left hepatic lobe, showing thin peripheral rim enhancement. Decreased size and enhancement of previously seen mass in segment 3 of the left hepatic lobe. No evidence of new or enlarging liver masses. 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 Moderate ascites, without significant change. Electronically Signed   By: JMarlaine HindM.D.   On: 11/15/2021 15:13    ASSESSMENT & PLAN:   81y.o. male with  1. 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 02/28/18 surgical pathology report with no overt pathology, could not rule out MDS, and noted an increase in ring sideroblasts 02/28/18 BM cytogenetics report was unremarkable  2. 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 Hepatocellular Carcinoma -- likely related to liver cirrhosis -02/14/2020 CT Abd/Pel ([id number] revealed "1. 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 2. 4 cm hypervascular mass in segment 4A of left hepatic lobe, and 8 mm hypervascular nodule in posterior right hepatic lobe. These are highly suspicious for hepatocellular carcinomas in the setting of cirrhosis. 3. No evidence of  metastatic disease." -03/27/2020 MRI Abdomen ([id number] revealed "1. The dominant lesion of concern in the medial segment of the left hepatic lobe (segment 4 A) is diagnostic of hepatocellular carcinoma (LR 5). 2. The other small lesion questioned inferiorly in the right lobe is not clearly seen, although evaluation of this area is mildly limited by motion artifact." -03/16/2022 MRI Liver w/o contrast ([id number] revealed "1. No compelling findings of viable tumor at this time. The treated lesion in segment 4 of the liver demonstrates thin rim enhancement without nodularity. The treated lesion in segment 3 of the liver is relatively occult/indistinct on  today's exam. No new arterial phase enhancing lesions in the liver are identified. Continued surveillance may be warranted. 2. Other imaging findings of potential clinical significance: Stable hiatal hernia containing mostly fluid, vessels, and adipose tissue. Hepatic cirrhosis. Bilateral renal atrophy. Substantial upper abdominal ascites. Lumbar spondylosis and degenerative disc disease. Extensive atherosclerosis. Aortic Atherosclerosis (ICD10-I70.0)."  Patient is status post SBRT to 2 HCC lesions on the liver  #4 iron deficiency anemia due to chronic GI bleeding related to his liver cirrhosis with portal hypertensive gastropathy and upper GI varices  PLAN: -Labs done today were reviewed in detail. CBC hemoglobin and 8.7 with WBC count of 6.4k stable platelets of 101k CMP with stable chronic kidney disease creatinine is 2.13 and GFR est is 31. Magnesium and phosphorus are WNL. Protime-INR shows prothrombin time of 18.6 sec and INR is 1.6. aPTT is 48 sec. -He continues to follow with Dr. Hilarie Fredrickson for continued evaluation and management of his liver cirrhosis and its complications including ascites. -No indication for PRBC transfusion at this time. -He continues to have issues with ascites requiring repeated paracentesis.   -Continue f/u with Dr.  Billie Lade for management of liver cirrhosis -Patient wants to continue to take a conservative approach at this time. -MRI of liver done 03/16/2022 revealed no MRI evidence of disease progression.   FOLLOW UP: ***  .The total time spent in the appointment was *** minutes* .  All of the patient's questions were answered with apparent satisfaction. The patient knows to call the clinic with any problems, questions or concerns.   Sullivan Lone MD MS AAHIVMS [facility] Hematology/Oncology Physician [facility]  .*Total Encounter Time as defined by the Centers for Medicare and Medicaid Services includes, in addition to the face-to-face time of a patient visit (documented in the note above) non-face-to-face time: obtaining and reviewing outside history, ordering and reviewing medications, tests or procedures, care coordination (communications with other health care professionals or caregivers) and documentation in the medical record.  I, Melene Muller, am acting as scribe for Dr. Sullivan Lone, MD.

## 2022-03-28 NOTE — Consult Note (Cosign Needed)
Consultation Note Date: 03/28/2022   Patient Name: [name]  DOB: 1941/07/22  MRN: [id number]  Age / Sex: 81 y.o., male  PCP: Joshua Olp, MD Referring Physician: Gwynne Edinger, MD  Reason for Consultation: Establishing goals of care  HPI/Patient Profile: 81 y.o. male  with past medical history of  HTN, HLD, AS, HFrEF (EF as low as 20-25%, recovered with Echo 02/2022 EF 55-60%, G1DD), COPD, DM2,HCC (s/p radiation), gastric/duodenal ulcers and diverticulosis, IDA, Cirrhosis w/w NASH, portal hypertension admitted on 03/22/2022 with weakness, decreased mobility, poor p.o. intake status post fall.    Patient was discharged home on 6/10 after TIPS placement.  Has decline rapidly since return home.  Was initially on pressors.  PMT has been consulted to assist with goals of care conversation.  Clinical Assessment and Goals of Care:  I have reviewed medical records including EPIC notes, labs and imaging, received report from RN, assessed the patient and then met at the bedside with patient's wife, daughter, brother to discuss diagnosis prognosis, GOC, EOL wishes, disposition and options.  I introduced Palliative Medicine as specialized medical care for people living with serious illness. It focuses on providing relief from the symptoms and stress of a serious illness. The goal is to improve quality of life for both the patient and the family.  We discussed a brief life review of the patient and then focused on their current illness.   I attempted to elicit values and goals of care important to the patient.    Medical History Review and Understanding:  We discussed patient's cirrhosis, TIPS procedure, difficulty in balancing encephalopathy and ascites.  Patient's family understand the severity of his illness.  Social History: Patient lives at home with his wife and son.  He has 1 brother and they are very  close.  He always enjoyed being active and riding, but has not been able to do so lately.    Functional and Nutritional State: Worsening weakness and nutritional status.  Palliative Symptoms: Agitation/anxiety, pain (he takes oxycodone as needed at home)  Advance Directives: A detailed discussion regarding advanced directives was had.  No HCPOA on file.  Patient's daughter [name] is primary contact.   Code Status: Concepts specific to code status, artifical feeding and hydration, and rehospitalization were considered and discussed.   Discussion: We discussed [name]'s quality of life and family all agrees that this is quite poor.  It would be unacceptable to [name] of debility for prolonged period.  They have not Frazier about his wishes or care preferences in great detail, although they do know he would never want to go to a nursing home even for rehab.  This was one of his greatest fears.  Unfortunately, family is no longer able to care for him with his worsening weakness.  We discussed that even if his kidney and liver function continues to improve, mental status improves, that is unlikely he would recover his strength enough to go straight home.  We would then need to talk about whether he would want hospice support to ensure his comfort and dignity.  His daughter notes that has suffered enough already and his wife agrees.  They are both wanting him to be pain-free.  His brother is having a hard time with this, as he was feeling very hopeful that [name] for a brief moment the other day.  We briefly discussed the difference between home hospice and residential hospice.  Family would like to see  how he does over the next day or two before making decisions.   The difference between aggressive medical intervention and comfort care was considered in light of the patient's goals of care. Hospice and Palliative Care services outpatient were explained and offered.   Discussed  the importance of continued conversation with family and the medical providers regarding overall plan of care and treatment options, ensuring decisions are within the context of the patient's values and GOCs.   Questions and concerns were addressed.  Hard Choices booklet left for review. The family was encouraged to call with questions or concerns.  PMT will continue to support holistically.   SUMMARY OF RECOMMENDATIONS   -DNR confirmed -Continue current care for now, no escalation -Family is leaning towards hospice but would like to give patient 24 to 48 hours to hopefully improve before making decisions -Psychosocial and emotional support provided -PMT will continue to follow  Very poor prognosis  Discharge Planning: To Be Determined      Primary Diagnoses: Present on Admission:  Hypotension    Physical Exam Vitals and nursing note reviewed.  Constitutional:      General: He is not in acute distress.    Appearance: He is ill-appearing.     Comments: Core track in place  Cardiovascular:     Rate and Rhythm: Tachycardia present.  Pulmonary:     Effort: Tachypnea present.  Neurological:     Mental Status: He is unresponsive.     Vital Signs: BP 127/75 (BP Location: Left Arm)   Pulse (!) 109   Temp (!) 97.5 F (36.4 C)   Resp (!) 21   Ht 5' 10" (1.778 m)   Wt 81.1 kg   SpO2 96%   BMI 25.65 kg/m  Pain Scale: 0-10 POSS *See Group Information*: S-Acceptable,Sleep, easy to arouse Pain Score: Asleep   SpO2: SpO2: 96 % O2 Device:SpO2: 96 % O2 Flow Rate: .O2 Flow Rate (L/min): 2 L/min   Palliative Assessment/Data: 10%      MDM: High    Joshua Tolson Johnnette Litter, PA-C  Palliative Medicine Team Team phone # [phone]  Thank you for allowing the Palliative Medicine Team to assist in the care of this patient. Please utilize secure chat with additional questions, if there is no response within 30 minutes please call the above phone number.  Palliative  Medicine Team providers are available by phone from 7am to 7pm daily and can be reached through the team cell phone.  Should this patient require assistance outside of these hours, please call the patient's attending physician.

## 2022-03-28 NOTE — TOC Initial Note (Signed)
Transition of Care [facility]) - Initial/Assessment Note    Patient Details  Name: [name] MRN: [id number] Date of Birth: Dec 25, 1940  Transition of Care [facility]) CM/SW Contact:    Angelita Ingles, RN Phone Number:[phone]  03/28/2022, 2:06 PM  Clinical Narrative:                 TOC following patient with high risk for readmission. CM at bedside to assess patient. Patient is confused alert to self only. CM spoke with son [name] via phone [phone]. [name] is agreeable to answer assessment questions. Per [name] patient is from home where he lives in a single family home with his spouse who has some dementia.  Patient was functioning at home independently with some assist from daughter. Patient does have a walker. [name] states that his sister is very active in helping his parents in the household. Patient PCP is Dr. [name] and the patient is active and follows up on a regular basis. Pharmacy of choice is [facility] [address] in [place]. [name] states that patient has transportation to appointments and anywhere that he may need to go. Transportation is provided by [place] or his sister [name]. Patient is currently not active with any home health services. Per [name] he is expecting to receive a call from palliative care. Currently the disposition plan for patient has not been determined. TOC will continue to follow       Expected Discharge Plan:  (undetermined) Barriers to Discharge: Continued Medical Work up   Patient Goals and CMS Choice Patient states their goals for this hospitalization and ongoing recovery are:: Patient is confused unable to answer question CMS Medicare.gov Compare Post Acute Care list provided to::  (n/a) Choice offered to / list presented to : NA  Expected Discharge Plan and Services Expected Discharge Plan:  (undetermined) In-house Referral: Hospice / Palliative Care Discharge Planning Services: CM Consult Post Acute Care Choice: NA (to be determined) Living  arrangements for the past 2 months: Single Family Home                 DME Arranged: N/A DME Agency: NA       HH Arranged: NA HH Agency: NA        Prior Living Arrangements/Services Living arrangements for the past 2 months: Single Family Home Lives with:: Spouse Patient language and need for interpreter reviewed:: Yes Do you feel safe going back to the place where you live?:  (n/a patient unable to verbalize answer confused)      Need for Family Participation in Patient Care: Yes (Comment) Care giver support system in place?: Yes (comment) Current home services: DME (per son patient has several walkers) Criminal Activity/Legal Involvement Pertinent to Current Situation/Hospitalization: No - Comment as needed  Activities of Daily Living      Permission Sought/Granted                  Emotional Assessment Appearance:: Appears stated age Attitude/Demeanor/Rapport: Unable to Assess Affect (typically observed): Unable to Assess Orientation: : Oriented to Self (oriented to self only) Alcohol / Substance Use: Not Applicable Psych Involvement: No (comment)  Admission diagnosis:  Dehydration [H82.9] Alcoholic cirrhosis of liver with ascites (HCC) [K70.31] Hypotension [I95.9] Acute renal failure, unspecified acute renal failure type (A[place]) [N17.9] Hypotension due to hypovolemia [I95.89, E86.1] Patient Active Problem List   Diagnosis Date Noted   Pressure injury of skin 03/23/2022   Acute renal failure ([place])    Shock ([place])    Hypotension  03/22/2022   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   Melena    Chronic gastric ulcer with hemorrhage    Varices of small intestine    Recurrent major depressive disorder, in full remission ([place]) 11/16/2021   Protein-calorie malnutrition, severe 11/05/2021   Sepsis ([place]) 11/04/2021   Acute lower UTI 11/04/2021   Other ascites    Sepsis due to Spontaneous bacterial peritonitis ([place]) 11/03/2021    Malnutrition of moderate degree 07/14/2021   Other cirrhosis of liver ([place])    Mucosal abnormality of stomach    Portal hypertensive gastropathy ([place])    Secondary esophageal varices without bleeding (HCC)    Generalized weakness 07/13/2021   Chronic diarrhea 07/13/2021   Diarrhea due to malabsorption 07/13/2021   Iron deficiency anemia due to chronic blood loss 04/19/2021   Hepatocellular carcinoma ([place]) 06/01/2020   Anxiety 54/06/8118   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[place]) 12/14/2019   Aortic atherosclerosis ([place]) 14/78/2956   Chronic systolic heart failure ([place]) 12/05/2019   Macrocytic anemia 03/05/2018   GERD (gastroesophageal reflux disease) 09/30/2017   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 positive 03/29/2016   Thrombocytopenia ([place]) 12/21/2015   B12 deficiency 08/20/2015   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   Diabetic polyneuropathy associated with type 2 diabetes mellitus ([place]) 08/07/2015   Diplopia 06/05/2015   Stage 3b chronic kidney disease ([place]) 05/06/2015   Fatty liver 11/04/2014   Candidal balanoposthitis 06/20/2014   PCO (posterior capsular opacification) 06/19/2013   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 Off narcotics 06/05/17 due to negative UDS for opiates x2. Full history 06/12/14. Pain contract signed.  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[place]) 04/06/2007   Hyperlipidemia associated with type 2 diabetes mellitus ([place]) 04/06/2007   Essential hypertension 04/06/2007   Osteoarthritis 04/06/2007   PCP:  [name], MD Pharmacy:   [facility] [address] [place] -  [place] [address] [place] [address] Phone: [phone] Fax: [phone]  [facility] [place], [place] [address] Phone: [phone] Fax: [phone]     Social Determinants of Health (SDOH) Interventions    Readmission Risk Interventions    03/28/2022    1:54 PM 07/19/2021    3:02 PM  Readmission Risk Prevention Plan  Transportation Screening Complete Complete  PCP or Specialist Appt within 5-7 Days  Complete  Home Care Screening  Complete  Medication Review (RN Care Manager) Referral to Pharmacy   PCP or Specialist appointment within 3-5 days of discharge Not Complete   PCP/Specialist Appt Not Complete comments will follow up when patient is close to being medically ready for discharge. Family may decide on comfort if no improvement in condition.   SW Recovery Care/Counseling Consult Complete   Palliative Care Screening Complete   Skilled Nursing Facility Not Applicable

## 2022-03-28 NOTE — Care Management Important Message (Signed)
Important Message  Patient Details  Name: [name] MRN: [id number] Date of Birth: 08-Feb-1941   Medicare Important Message Given:  Yes     [place], Dowdy 03/28/2022, 2:43 PM

## 2022-03-28 NOTE — Progress Notes (Signed)
Progress Note:    [name]    GGY:[id number] DOB: 08/14/1941 DOA: 03/22/2022  PCP: [name], MD    Brief Narrative:   81 year old man who presented to [facility] 6/13 for weakness, decreased mobility and poor PO intake s/p fall 3 days PTA. PMHx significant for HTN, HLD, mild AS, HFrEF (EF as low as 20-25%, recovered with Echo 02/2022 EF 55-60%, G1DD), COPD, prior tobacco use, T2DM, NASH cirrhosis with portal hypertension as evidenced by EV, PHG, ascites requiring frequent paracentesis (c/b SBP) and HCC (s/p radiation), gastric/duodenal ulcers and diverticulosis, IDA. Recently underwent TIPS placement with IR 6/9 with gradient reduction from 13 to 2. Monitored x 24H and discharged home 6/10.  Patient reportedly had a fall 6/10 shortly after arriving home, sustaining a L arm injury. Since injury, patient's mobility has been decreased and appetite has been poor; Additionally reporting decreased UOP and constipation. Patient was advised by IR to present to ED for further evaluation. Presented to [facility] ED 6/13 for weakness, decreased mobility and poor PO intake s/p fall. Vitals on ED arrival were notable for BP 89/35. Labs notable for new transaminitis with AST 545/ALT 293, Cr 4.78 (baseline 1.3-1.8), INR 1.5, WBC/LA WNL. Hgb 8.7, near baseline. CXR with small L pleural effusion. CT C-spine and L arm/shoulder imaging negative. Fluid resuscitation was initiated (3L) with slight improvement in SBPs to 80s. RIJ CVC placed and Levophed was initiated for additional BP support. PCCM consulted for ICU admission/transfer to [facility].   Transferred to [facility] for continued care.  Family wants no escalation of care.  Continue medical support in hopes of improvement.  Considering comfort care if there is a further decline in clinical status.  No HD, vasopressors, ventilator or CPR. Overall prognosis poor.   Subjective:   Seen and examined this morning.  Says he "doesn't feel like a damn." No specific  complaints. Confused.  Assessment and Plan:   Hepatic encephalopathy: Continue lactulose, and rifaximin. Had 4 bowel movements in the last 24 hours so will decrease lactulose dose from 30 to 20 tid. Rectal tube inserted overnight for loose stools, no diarrhea, no fever or elevated wbcs, will monitor  Dyspnagia Feeding tube in place. RN says taking some liquids by mouth. Will request SLP eval  Shock: Improved. Multifactorial. Vasopressors off.  Could not rule out sepsis.  Being empirically treated for SBP.  Continue Rocephin and complete 7 day course (started 6/14).  Peritoneal fluid cultures negative, blood and urine cultures negative.  Liver cirrhosis with portal hypertension ascites status post TIPS Poor prognosis. Palliative consult today  Acute renal failure: Family declines dialysis.  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  Urine output improving with daily lasix challenges.  Strict I's and O's. Repeat lasix challenge today. 2.7L urine output yesterday. Seen by nephrology yesterday and agreed with lasix challenge if continued improvement. Could consider midodrine for hypoperfusion support if family agrees, but currently hemodynamically stable. Will continue lasix  Hepatocellular carcinoma status post SBRT: No acute treatment.  HFrEF: EF improved to 55 to 60%, previously about 20%: Holding home guideline directed medical therapy including Entresto, Coreg due to shock state and acute renal failure  COPD: Not in acute exacerbation  Thrombocytopenia, elevated INR: Secondary to liver cirrhosis: Monitor platelets, hemoglobin.  No signs of bleeding  Type 2 diabetes mellitus: Continue SSI.  CBGs every 4 hours. Will add semglee 5 qd for persistent hyperglycmemia  Gastric and duodenal ulcers: Continue PPI  Depression/anxiety: Holding home Lexapro and BuSpar  Other information:    DVT prophylaxis: Heparin subcu Code Status: DNR Family Communication: Discussed with son telephonically  6/19 Disposition:   Status is: Inpatient Remains inpatient appropriate because: Not medically cleared.       Consultants:   Nephrology, signed off.    Objective:    Vitals:   03/27/22 2319 03/28/22 0342 03/28/22 0500 03/28/22 0727  BP: 129/84 130/72  134/88  Pulse: (!) 113 (!) 110  (!) 113  Resp: 18 16  (!) 21  Temp: 98.1 F (36.7 C) 98.2 F (36.8 C)  98.2 F (36.8 C)  TempSrc: Oral Oral  Axillary  SpO2: 96% 95%  94%  Weight:   81.1 kg   Height:        Intake/Output Summary (Last 24 hours) at 03/28/2022 1036 Last data filed at 03/28/2022 0415 Gross per 24 hour  Intake 2251.67 ml  Output 2950 ml  Net -698.33 ml   Filed Weights   03/26/22 0500 03/27/22 0027 03/28/22 0500  Weight: 87.2 kg 81.9 kg 81.1 kg       Physical Exam:   Gen confused, somnolent, mittens bilat in place, more alert can tell me his name but not much else No rash Chest clear bilat to bases, no rales/ wheezing RRR no MRG Abd soft ntnd no mass, 2+ nontense ascites, +bs GU normal male, condom cath in place, penile and scrotal edema MS no joint effusions or deformity Ext 1+ pitting dependent hip edema bilat, no wounds or ulcers   Data Reviewed:    I have personally reviewed following labs and imaging studies  CBC: Recent Labs  Lab 03/22/22 1425 03/23/22 0002 03/25/22 0314 03/26/22 0636 03/27/22 0107 03/27/22 0542 03/28/22 0133  WBC 5.1   < > 5.0 4.5 5.2 5.8 6.4  NEUTROABS 4.0  --   --   --  SPECIMEN CLOTTED TANYA D, RN NOTIFIED 03/27/2022 0224 B[place] 4.4 4.7  HGB 8.7*   < > 7.4* 7.3* 8.5* 8.6* 8.7*  HCT 28.3*   < > 21.9* 22.7* 26.4* 25.8* 26.5*  MCV 98.6   < > 94.0 96.2 97.1 95.6 97.1  PLT 87*   < > 77* 89* 102* 109* 101*   < > = values in this interval not displayed.    Basic Metabolic Panel: Recent Labs  Lab 03/24/22 0304 03/24/22 1238 03/25/22 0314 03/25/22 1710 03/26/22 0636 03/27/22 0107 03/28/22 0133  NA 136 138 138  --  142 145 147*  K 5.4* 5.5* 4.8  --   3.6 3.5 3.3*  CL 104 109 109  --  112* 113* 114*  CO2 21* 21* 19*  --  21* 20* 23  GLUCOSE 152* 117* 106*  --  166* 193* 201*  BUN 56* 57* 57*  --  63* 65* 62*  CREATININE 3.66* 3.63* 3.45*  --  3.13* 2.83* 2.13*  CALCIUM 8.8* 8.9 8.7*  --  9.0 9.3 9.3  MG 1.9  --  2.0 2.0 1.9  --  1.7  PHOS 4.9*  --  5.3* 4.8* 4.4  --  2.7    GFR: Estimated Creatinine Clearance: 28.1 mL/min (A) (by C-G formula based on SCr of 2.13 mg/dL (H)).  Liver Function Tests: Recent Labs  Lab 03/24/22 0304 03/25/22 0314 03/26/22 0636 03/27/22 0107 03/28/22 0133  AST 193* 141* 108* 102* 87*  ALT 164* 139* 110* 107* 86*  ALKPHOS 305* 327* 322* 350* 350*  BILITOT 2.1* 2.1* 1.6* 2.2* 2.3*  PROT 5.3* 4.9* 5.0* 5.2* 5.3*  ALBUMIN  3.2* 2.8* 2.9* 3.1* 2.9*    CBG: Recent Labs  Lab 03/27/22 1639 03/27/22 1958 03/27/22 2316 03/28/22 0339 03/28/22 0755  GLUCAP 203* 216* 179* 247* 232*     Recent Results (from the past 240 hour(s))  Blood culture (routine x 2)     Status: None   Collection Time: 03/22/22  2:26 PM   Specimen: BLOOD RIGHT ARM  Result Value Ref Range Status   Specimen Description BLOOD RIGHT ARM  Final   Special Requests   Final    Immunocompromised BOTTLES DRAWN AEROBIC AND ANAEROBIC Blood Culture results may not be optimal due to an inadequate volume of blood received in culture bottles   Culture   Final    NO GROWTH 5 DAYS Performed at [facility], [address] [place], [place] [address]    Report Status 03/27/2022 FINAL  Final  Blood culture (routine x 2)     Status: None   Collection Time: 03/22/22  2:31 PM   Specimen: BLOOD LEFT WRIST  Result Value Ref Range Status   Specimen Description BLOOD LEFT WRIST  Final   Special Requests   Final    Immunocompromised BOTTLES DRAWN AEROBIC AND ANAEROBIC Blood Culture adequate volume   Culture   Final    NO GROWTH 5 DAYS Performed at [facility], [address] [place], [place] [address]    Report Status 03/27/2022 FINAL   Final  MRSA Next Gen by PCR, Nasal     Status: None   Collection Time: 03/23/22 12:07 AM  Result Value Ref Range Status   MRSA by PCR Next Gen NOT DETECTED NOT DETECTED Final    Comment: (NOTE) The GeneXpert MRSA Assay (FDA approved for NASAL specimens only), is one component of a comprehensive MRSA colonization surveillance program. It is not intended to diagnose MRSA infection nor to guide or monitor treatment for MRSA infections. Test performance is not FDA approved in patients less than 66 years old. Performed at [facility], [place] [address], [place], [place] [address]   Urine Culture     Status: None   Collection Time: 03/23/22  2:02 PM   Specimen: Urine, Catheterized  Result Value Ref Range Status   Specimen Description URINE, CATHETERIZED  Final   Special Requests NONE  Final   Culture   Final    NO GROWTH Performed at [facility], [address] [place], [place] [address]    Report Status 03/24/2022 FINAL  Final  Body fluid culture w Gram Stain     Status: None   Collection Time: 03/23/22  2:46 PM   Specimen: Body Fluid  Result Value Ref Range Status   Specimen Description FLUID PARACENTESIS  Final   Special Requests NONE  Final   Gram Stain NO WBC SEEN NO ORGANISMS SEEN   Final   Culture   Final    NO GROWTH 3 DAYS Performed at [facility], [address] [place], [place] [address]    Report Status 03/26/2022 FINAL  Final         Radiology Studies:    No results found.      Medications:    Scheduled Meds:  feeding supplement (PROSource TF)  45 mL Per Tube TID   Gerhardt's butt cream   Topical BID   heparin  5,000 Units Subcutaneous Q8H   insulin aspart  0-9 Units Subcutaneous Q4H   lactulose  30 g Per Tube TID   mouth rinse  15 mL Mouth Rinse BID   pantoprazole (PROTONIX) IV  40 mg Intravenous Q12H   rifaximin  550 mg Per Tube BID   Continuous Infusions:  sodium chloride Stopped (03/25/22 1154)   cefTRIAXone  (ROCEPHIN)  IV Stopped (03/27/22 2250)   feeding supplement (OSMOLITE 1.5 CAL) 50 mL/hr at 03/28/22 0415       LOS: 6 days    Time spent: 45 minutes    Desma Maxim, MD [facility]   To contact the attending provider between 7A-7P or the covering provider during after hours 7P-7A, please log into the web site [email or web address] and access using universal [place] password for that web site. If you do not have the password, please call the hospital operator.  03/28/2022, 10:36 AM

## 2022-03-29 DIAGNOSIS — R627 Adult failure to thrive: Secondary | ICD-10-CM

## 2022-03-29 DIAGNOSIS — N179 Acute kidney failure, unspecified: Secondary | ICD-10-CM | POA: Diagnosis not present

## 2022-03-29 DIAGNOSIS — K7031 Alcoholic cirrhosis of liver with ascites: Secondary | ICD-10-CM

## 2022-03-29 DIAGNOSIS — I959 Hypotension, unspecified: Secondary | ICD-10-CM | POA: Diagnosis not present

## 2022-03-29 LAB — CBC WITH DIFFERENTIAL/PLATELET
Abs Immature Granulocytes: 0.06 10*3/uL (ref 0.00–0.07)
Basophils Absolute: 0 10*3/uL (ref 0.0–0.1)
Basophils Relative: 0 %
Eosinophils Absolute: 0.1 10*3/uL (ref 0.0–0.5)
Eosinophils Relative: 2 %
HCT: 26.7 % — ABNORMAL LOW (ref 39.0–52.0)
Hemoglobin: 8.9 g/dL — ABNORMAL LOW (ref 13.0–17.0)
Immature Granulocytes: 1 %
Lymphocytes Relative: 17 %
Lymphs Abs: 0.8 10*3/uL (ref 0.7–4.0)
MCH: 32.2 pg (ref 26.0–34.0)
MCHC: 33.3 g/dL (ref 30.0–36.0)
MCV: 96.7 fL (ref 80.0–100.0)
Monocytes Absolute: 0.4 10*3/uL (ref 0.1–1.0)
Monocytes Relative: 9 %
Neutro Abs: 3.2 10*3/uL (ref 1.7–7.7)
Neutrophils Relative %: 71 %
Platelets: UNDETERMINED 10*3/uL (ref 150–400)
RBC: 2.76 MIL/uL — ABNORMAL LOW (ref 4.22–5.81)
RDW: 24.5 % — ABNORMAL HIGH (ref 11.5–15.5)
WBC: 4.6 10*3/uL (ref 4.0–10.5)
nRBC: 0.7 % — ABNORMAL HIGH (ref 0.0–0.2)

## 2022-03-29 LAB — GLUCOSE, CAPILLARY
Glucose-Capillary: 164 mg/dL — ABNORMAL HIGH (ref 70–99)
Glucose-Capillary: 174 mg/dL — ABNORMAL HIGH (ref 70–99)
Glucose-Capillary: 182 mg/dL — ABNORMAL HIGH (ref 70–99)
Glucose-Capillary: 184 mg/dL — ABNORMAL HIGH (ref 70–99)
Glucose-Capillary: 196 mg/dL — ABNORMAL HIGH (ref 70–99)
Glucose-Capillary: 203 mg/dL — ABNORMAL HIGH (ref 70–99)
Glucose-Capillary: 205 mg/dL — ABNORMAL HIGH (ref 70–99)

## 2022-03-29 LAB — COMPREHENSIVE METABOLIC PANEL
ALT: 75 U/L — ABNORMAL HIGH (ref 0–44)
AST: 70 U/L — ABNORMAL HIGH (ref 15–41)
Albumin: 2.8 g/dL — ABNORMAL LOW (ref 3.5–5.0)
Alkaline Phosphatase: 333 U/L — ABNORMAL HIGH (ref 38–126)
Anion gap: 11 (ref 5–15)
BUN: 60 mg/dL — ABNORMAL HIGH (ref 8–23)
CO2: 27 mmol/L (ref 22–32)
Calcium: 9.4 mg/dL (ref 8.9–10.3)
Chloride: 113 mmol/L — ABNORMAL HIGH (ref 98–111)
Creatinine, Ser: 1.7 mg/dL — ABNORMAL HIGH (ref 0.61–1.24)
GFR, Estimated: 40 mL/min — ABNORMAL LOW (ref >60)
Glucose, Bld: 200 mg/dL — ABNORMAL HIGH (ref 70–99)
Potassium: 3.5 mmol/L (ref 3.5–5.1)
Sodium: 151 mmol/L — ABNORMAL HIGH (ref 135–145)
Total Bilirubin: 2.4 mg/dL — ABNORMAL HIGH (ref 0.3–1.2)
Total Protein: 5.4 g/dL — ABNORMAL LOW (ref 6.5–8.1)

## 2022-03-29 LAB — APTT: aPTT: 39 seconds — ABNORMAL HIGH (ref 24–36)

## 2022-03-29 LAB — PROTIME-INR
INR: 1.5 — ABNORMAL HIGH (ref 0.8–1.2)
Prothrombin Time: 17.5 seconds — ABNORMAL HIGH (ref 11.4–15.2)

## 2022-03-29 LAB — PHOSPHORUS: Phosphorus: 2.1 mg/dL — ABNORMAL LOW (ref 2.5–4.6)

## 2022-03-29 LAB — MAGNESIUM: Magnesium: 2 mg/dL (ref 1.7–2.4)

## 2022-03-29 MED ORDER — ORAL CARE MOUTH RINSE
15.0000 mL | OROMUCOSAL | Status: DC
  Administered 2022-03-29 – 2022-04-04 (×17): 15 mL via OROMUCOSAL

## 2022-03-29 MED ORDER — ORAL CARE MOUTH RINSE: 15.0000 mL | OROMUCOSAL | Status: DC | PRN

## 2022-03-29 NOTE — Plan of Care (Signed)
  Problem: Cardiovascular: Goal: Ability to achieve and maintain adequate cardiovascular perfusion will improve Outcome: Progressing Goal: Vascular access site(s) Level 0-1 will be maintained Outcome: Progressing   Problem: Clinical Measurements: Goal: Respiratory complications will improve Outcome: Progressing   Problem: Nutrition: Goal: Adequate nutrition will be maintained Outcome: Progressing   Problem: Elimination: Goal: Will not experience complications related to urinary retention Outcome: Progressing   Problem: Elimination: Goal: Will not experience complications related to bowel motility Outcome: Not Progressing

## 2022-03-29 NOTE — Progress Notes (Signed)
Daily Progress Note   Patient Name: [name]       Date: 03/29/2022 DOB: 1940/10/12  Age: 81 y.o. MRN#: [id number] Attending Physician: Dwyane Dee, MD Primary Care Physician: [name], MD Admit Date: 03/22/2022  Reason for Consultation/Follow-up: Establishing goals of care  Subjective: Medical records reviewed. Patient assessed at the bedside.  He is unresponsive, open mouth breathing in no acute distress.  No family at the bedside during my visit.  I then called patient's daughter [name] to continue goals of care conversation, provided updates and support.  We discussed that while patient's labs seem to be improving, his mental status and debility continue to negatively impact his quality of life.  It does not appear this will improve over the next day or unfortunately at all.  Provided update on SLP visit and that he is still not safe to eat or drink.  [name] shares that she did expect this and she has discussed with her brother.  They plan to visit early tomorrow morning to discuss with [name]'s doctor one more time before making a final decision.  She tells me they will likely transition to comfort focused care and when she asked for my recommendation, I shared that this is what I would suggest.  She wants to hear the doctor's recommendation and also shared her understanding that this will likely be consistent and it is time for consideration of hospice.  We discussed in-house comfort care in detail. Patient would no longer receive aggressive medical interventions such as continuous vital signs, lab work, radiology testing, or medications not focused on comfort. All care would focus on how the patient is looking and feeling. This would include management of any symptoms that may cause  discomfort, pain, shortness of breath, cough, nausea, agitation, anxiety, and/or secretions etc. Symptoms would be managed with medications and other non-pharmacological interventions such as spiritual support if requested, repositioning, music therapy, or therapeutic listening. Family verbalized understanding and appreciation.  We discussed logistics of referral to residential hospice.   Questions and concerns addressed.  Family is aware that I am not on service tomorrow by my colleague Elmer Picker NP will be available for continued support.  PMT will continue to support holistically.   Length of Stay: 7   Physical Exam Vitals and nursing note reviewed.  Constitutional:  General: He is not in acute distress.    Appearance: He is ill-appearing.     Comments: Core track in place  Cardiovascular:     Rate and Rhythm: Tachycardia present.  Pulmonary:     Effort: Pulmonary effort is normal.  Neurological:     Mental Status: He is unresponsive.             Vital Signs: BP 132/71 (BP Location: Left Wrist)   Pulse (!) 103   Temp 98.1 F (36.7 C) (Tympanic)   Resp 19   Ht '5\' 10"'$  (1.778 m)   Wt 82.1 kg   SpO2 98%   BMI 25.97 kg/m  SpO2: SpO2: 98 % O2 Device: O2 Device: Room Air O2 Flow Rate: O2 Flow Rate (L/min): 2 L/min      Palliative Assessment/Data: 10%       Palliative Care Assessment & Plan   Patient Profile:  81 y.o. male  with past medical history of  HTN, HLD, AS, HFrEF (EF as low as 20-25%, recovered with Echo 02/2022 EF 55-60%, G1DD), COPD, DM2,HCC (s/p radiation), gastric/duodenal ulcers and diverticulosis, IDA, Cirrhosis w/w NASH, portal hypertension admitted on 03/22/2022 with weakness, decreased mobility, poor p.o. intake status post fall.    Patient was discharged home on 6/10 after TIPS placement.  Has decline rapidly since return home.  Was initially on pressors.  PMT has been consulted to assist with goals of care conversation.  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 Sabino Gasser  MDM high         Davinder Haff Johnnette Litter, PA-C  Palliative Medicine Team Team phone # [phone]  Thank you for allowing the Palliative Medicine Team to assist in the care of this patient. Please utilize secure chat with additional questions, if there is no response within 30 minutes please call the above phone number.  Palliative Medicine Team providers are available by phone from 7am to 7pm daily and can be reached through the team cell phone.  Should this patient require assistance outside of these hours, please call the patient's attending physician.

## 2022-03-29 NOTE — Plan of Care (Signed)
  Problem: Education: Goal: Understanding of CV disease, CV risk reduction, and recovery process will improve Outcome: Progressing   Problem: Health Behavior/Discharge Planning: Goal: Ability to identify and utilize available resources and services will improve Outcome: Progressing

## 2022-03-29 NOTE — Hospital Course (Addendum)
81 year old man who presented to [facility] 6/13 for weakness, decreased mobility and poor PO intake s/p fall 3 days PTA. PMHx significant for HTN, HLD, mild AS, HFrEF (EF as low as 20-25%, recovered with Echo 02/2022 EF 55-60%, G1DD), COPD, prior tobacco use, T2DM, NASH cirrhosis with portal hypertension as evidenced by EV, PHG, ascites requiring frequent paracentesis (c/b SBP) and HCC (s/p radiation), gastric/duodenal ulcers and diverticulosis, IDA. Recently underwent TIPS placement with IR 6/9 with gradient reduction from 13 to 2. Monitored x 24H and discharged home 6/10.   Patient reportedly had a fall 6/10 shortly after arriving home, sustaining a L arm injury. Since injury, patient's mobility has been decreased and appetite has been poor; Additionally reporting decreased UOP and constipation. Patient was advised by IR to present to ED for further evaluation. Presented to [facility] ED 6/13 for weakness, decreased mobility and poor PO intake s/p fall. Vitals on ED arrival were notable for BP 89/35. Labs notable for new transaminitis with AST 545/ALT 293, Cr 4.78 (baseline 1.3-1.8), INR 1.5, WBC/LA WNL. Hgb 8.7, near baseline. CXR with small L pleural effusion. CT C-spine and L arm/shoulder imaging negative. Fluid resuscitation was initiated (3L) with slight improvement in SBPs to 80s. RIJ CVC placed and Levophed was initiated for additional BP support. PCCM consulted for ICU admission/transfer to [facility].    Transferred to [facility] for continued care.  Family wants no escalation of care.  Continue medical support in hopes of improvement.  Considering comfort care if there is a further decline in clinical status.

## 2022-03-29 NOTE — Progress Notes (Signed)
Progress Note    [name]   YTK:[id number]  DOB: Jul 07, 1941  DOA: 03/22/2022     7 PCP: [name], MD  Initial CC: weakness  Hospital Course: 81 year old man who presented to [facility] 6/13 for weakness, decreased mobility and poor PO intake s/p fall 3 days PTA. PMHx significant for HTN, HLD, mild AS, HFrEF (EF as low as 20-25%, recovered with Echo 02/2022 EF 55-60%, G1DD), COPD, prior tobacco use, T2DM, NASH cirrhosis with portal hypertension as evidenced by EV, PHG, ascites requiring frequent paracentesis (c/b SBP) and HCC (s/p radiation), gastric/duodenal ulcers and diverticulosis, IDA. Recently underwent TIPS placement with IR 6/9 with gradient reduction from 13 to 2. Monitored x 24H and discharged home 6/10.   Patient reportedly had a fall 6/10 shortly after arriving home, sustaining a L arm injury. Since injury, patient's mobility has been decreased and appetite has been poor; Additionally reporting decreased UOP and constipation. Patient was advised by IR to present to ED for further evaluation. Presented to [facility] ED 6/13 for weakness, decreased mobility and poor PO intake s/p fall. Vitals on ED arrival were notable for BP 89/35. Labs notable for new transaminitis with AST 545/ALT 293, Cr 4.78 (baseline 1.3-1.8), INR 1.5, WBC/LA WNL. Hgb 8.7, near baseline. CXR with small L pleural effusion. CT C-spine and L arm/shoulder imaging negative. Fluid resuscitation was initiated (3L) with slight improvement in SBPs to 80s. RIJ CVC placed and Levophed was initiated for additional BP support. PCCM consulted for ICU admission/transfer to [facility]    Transferred to [facility] for continued care.  Family wants no escalation of care.  Continue medical support in hopes of improvement.  Considering comfort care if there is a further decline in clinical status.  No HD, vasopressors, ventilator or CPR. Overall prognosis poor.  Interval History:  No events overnight.  Laying in bed in no distress and able to  follow some commands but still having very difficult to understand speech and extremely poor energy.  Assessment and Plan:  Hepatic encephalopathy:  - Continue lactulose, and rifaximin.  -Continue lactulose, will adjust as necessary. - Continue rectal tube   Dysphagia Feeding tube in place.  -Still unsafe for oral intake.  SLP eval performed 03/28/2022   Shock: Improved. Multifactorial. Vasopressors off.  Could not rule out sepsis.  Being empirically treated for SBP.  Continue Rocephin and complete 7 day course (started 6/14).  Peritoneal fluid cultures negative, blood and urine cultures negative.   Liver cirrhosis with portal hypertension ascites status post TIPS Poor prognosis.  Appreciate palliative care assistance - Continue GOC discussions, patient does not appear to be making any meaningful improvement despite tube feeds   Acute renal failure:  - Family declines dialysis.  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  Urine output improving with daily lasix challenges.   - Strict I's and O's.   Hepatocellular carcinoma status post SBRT: No acute treatment.   HFrEF: EF improved to 55 to 60%, previously about 20%: Holding home guideline directed medical therapy including Entresto, Coreg due to shock state and acute renal failure   COPD: Not in acute exacerbation   Thrombocytopenia, elevated INR: Secondary to liver cirrhosis: Monitor platelets, hemoglobin.  No signs of bleeding   Type 2 diabetes mellitus: Continue SSI.  CBGs every 4 hours. Will add semglee 5 qd for persistent hyperglycmemia   Gastric and duodenal ulcers: Continue PPI   Depression/anxiety: Holding home Lexapro and BuSpar   Old records reviewed in assessment of this patient  Antimicrobials: Rocephin  03/27/2022 >> current  DVT prophylaxis:  heparin injection 5,000 Units Start: 03/23/22 1200 SCDs Start: 03/22/22 2351   Code Status:   Code Status: DNR  Disposition Plan:  pending further [place] discussions Status  is: Inpt  Objective: Blood pressure 132/71, pulse (!) 110, temperature 98.1 F (36.7 C), temperature source Tympanic, resp. rate 17, height '5\' 10"'$  (1.778 m), weight 82.1 kg, SpO2 97 %.  Examination:  Physical Exam Constitutional:      Comments: Minimally interactive elderly gentleman lying in bed in no distress  HENT:     Head: Normocephalic and atraumatic.     Mouth/Throat:     Mouth: Mucous membranes are dry.  Eyes:     Pupils: Pupils are equal, round, and reactive to light.  Cardiovascular:     Rate and Rhythm: Normal rate and regular rhythm.  Pulmonary:     Effort: Pulmonary effort is normal.     Breath sounds: Normal breath sounds.  Abdominal:     General: Bowel sounds are normal.     Palpations: Abdomen is soft.  Musculoskeletal:        General: Normal range of motion.     Cervical back: Normal range of motion.  Skin:    General: Skin is warm and dry.  Neurological:     Comments: Follows some commands and moves all 4 extremities      Consultants:  Palliative care  Procedures:    Data Reviewed: Results for orders placed or performed during the hospital encounter of 03/22/22 (from the past 24 hour(s))  Glucose, capillary     Status: Abnormal   Collection Time: 03/28/22  4:42 PM  Result Value Ref Range   Glucose-Capillary 209 (H) 70 - 99 mg/dL   Comment 1 Notify RN    Comment 2 Document in Chart   Glucose, capillary     Status: Abnormal   Collection Time: 03/28/22  8:40 PM  Result Value Ref Range   Glucose-Capillary 204 (H) 70 - 99 mg/dL   Comment 1 Notify RN    Comment 2 Document in Chart   Glucose, capillary     Status: Abnormal   Collection Time: 03/29/22 12:01 AM  Result Value Ref Range   Glucose-Capillary 182 (H) 70 - 99 mg/dL   Comment 1 Notify RN    Comment 2 Document in Chart   CBC with Differential/Platelet     Status: Abnormal   Collection Time: 03/29/22  1:45 AM  Result Value Ref Range   WBC 4.6 4.0 - 10.5 K/uL   RBC 2.76 (L) 4.22 - 5.81  MIL/uL   Hemoglobin 8.9 (L) 13.0 - 17.0 g/dL   HCT 26.7 (L) 39.0 - 52.0 %   MCV 96.7 80.0 - 100.0 fL   MCH 32.2 26.0 - 34.0 pg   MCHC 33.3 30.0 - 36.0 g/dL   RDW 24.5 (H) 11.5 - 15.5 %   Platelets PLATELET CLUMPS NOTED ON SMEAR, UNABLE TO ESTIMATE 150 - 400 K/uL   nRBC 0.7 (H) 0.0 - 0.2 %   Neutrophils Relative % 71 %   Neutro Abs 3.2 1.7 - 7.7 K/uL   Lymphocytes Relative 17 %   Lymphs Abs 0.8 0.7 - 4.0 K/uL   Monocytes Relative 9 %   Monocytes Absolute 0.4 0.1 - 1.0 K/uL   Eosinophils Relative 2 %   Eosinophils Absolute 0.1 0.0 - 0.5 K/uL   Basophils Relative 0 %   Basophils Absolute 0.0 0.0 - 0.1 K/uL   Immature Granulocytes 1 %  Abs Immature Granulocytes 0.06 0.00 - 0.07 K/uL  Comprehensive metabolic panel     Status: Abnormal   Collection Time: 03/29/22  1:45 AM  Result Value Ref Range   Sodium 151 (H) 135 - 145 mmol/L   Potassium 3.5 3.5 - 5.1 mmol/L   Chloride 113 (H) 98 - 111 mmol/L   CO2 27 22 - 32 mmol/L   Glucose, Bld 200 (H) 70 - 99 mg/dL   BUN 60 (H) 8 - 23 mg/dL   Creatinine, Ser 1.70 (H) 0.61 - 1.24 mg/dL   Calcium 9.4 8.9 - 10.3 mg/dL   Total Protein 5.4 (L) 6.5 - 8.1 g/dL   Albumin 2.8 (L) 3.5 - 5.0 g/dL   AST 70 (H) 15 - 41 U/L   ALT 75 (H) 0 - 44 U/L   Alkaline Phosphatase 333 (H) 38 - 126 U/L   Total Bilirubin 2.4 (H) 0.3 - 1.2 mg/dL   GFR, Estimated 40 (L) >60 mL/min   Anion gap 11 5 - 15  Phosphorus     Status: Abnormal   Collection Time: 03/29/22  1:45 AM  Result Value Ref Range   Phosphorus 2.1 (L) 2.5 - 4.6 mg/dL  Magnesium     Status: None   Collection Time: 03/29/22  1:45 AM  Result Value Ref Range   Magnesium 2.0 1.7 - 2.4 mg/dL  Protime-INR     Status: Abnormal   Collection Time: 03/29/22  1:45 AM  Result Value Ref Range   Prothrombin Time 17.5 (H) 11.4 - 15.2 seconds   INR 1.5 (H) 0.8 - 1.2  APTT     Status: Abnormal   Collection Time: 03/29/22  1:45 AM  Result Value Ref Range   aPTT 39 (H) 24 - 36 seconds  Glucose, capillary      Status: Abnormal   Collection Time: 03/29/22  3:58 AM  Result Value Ref Range   Glucose-Capillary 174 (H) 70 - 99 mg/dL  Glucose, capillary     Status: Abnormal   Collection Time: 03/29/22  8:12 AM  Result Value Ref Range   Glucose-Capillary 205 (H) 70 - 99 mg/dL  Glucose, capillary     Status: Abnormal   Collection Time: 03/29/22 12:10 PM  Result Value Ref Range   Glucose-Capillary 196 (H) 70 - 99 mg/dL   Comment 1 Notify RN    Comment 2 Document in Chart    *Note: Due to a large number of results and/or encounters for the requested time period, some results have not been displayed. A complete set of results can be found in Results Review.    I have Reviewed nursing notes, Vitals, and Lab results since pt's last encounter. Pertinent lab results : see above I have ordered test including BMP, CBC, Mg I have reviewed the last note from staff over past 24 hours I have discussed pt's care plan and test results with nursing staff, case manager   LOS: 7 days   Dwyane Dee, MD [facility] 03/29/2022, 12:44 PM

## 2022-03-29 NOTE — Progress Notes (Signed)
Speech Language Pathology Treatment:    Patient Details Name: [name] MRN: [id number] DOB: [birth year] Today's Date: 03/29/2022 Time:  -     Spoke checked with RN who reports pt is not responsive. SLP  looked in room and pt sleeping heavily and therapist did not try to arouse. Family deciding on goals of care (hospice?). At this time pt does not appear appropriate for food/liquid and SLP will sign off. If he arouses, either reorder ST or allow pt sips and bites and appropriate.                        Houston Siren  03/29/2022, 1:07 PM

## 2022-03-30 ENCOUNTER — Telehealth: Payer: Self-pay

## 2022-03-30 DIAGNOSIS — R627 Adult failure to thrive: Secondary | ICD-10-CM | POA: Diagnosis not present

## 2022-03-30 DIAGNOSIS — N179 Acute kidney failure, unspecified: Secondary | ICD-10-CM | POA: Diagnosis not present

## 2022-03-30 DIAGNOSIS — K7031 Alcoholic cirrhosis of liver with ascites: Secondary | ICD-10-CM | POA: Diagnosis not present

## 2022-03-30 DIAGNOSIS — Z515 Encounter for palliative care: Secondary | ICD-10-CM

## 2022-03-30 DIAGNOSIS — Z66 Do not resuscitate: Secondary | ICD-10-CM

## 2022-03-30 DIAGNOSIS — R579 Shock, unspecified: Secondary | ICD-10-CM

## 2022-03-30 DIAGNOSIS — Z711 Person with feared health complaint in whom no diagnosis is made: Secondary | ICD-10-CM

## 2022-03-30 DIAGNOSIS — K7460 Unspecified cirrhosis of liver: Secondary | ICD-10-CM

## 2022-03-30 DIAGNOSIS — Z789 Other specified health status: Secondary | ICD-10-CM

## 2022-03-30 DIAGNOSIS — I959 Hypotension, unspecified: Secondary | ICD-10-CM | POA: Diagnosis not present

## 2022-03-30 DIAGNOSIS — R638 Other symptoms and signs concerning food and fluid intake: Secondary | ICD-10-CM

## 2022-03-30 LAB — COMPREHENSIVE METABOLIC PANEL
ALT: 56 U/L — ABNORMAL HIGH (ref 0–44)
AST: 53 U/L — ABNORMAL HIGH (ref 15–41)
Albumin: 2.8 g/dL — ABNORMAL LOW (ref 3.5–5.0)
Alkaline Phosphatase: 348 U/L — ABNORMAL HIGH (ref 38–126)
Anion gap: 10 (ref 5–15)
BUN: 61 mg/dL — ABNORMAL HIGH (ref 8–23)
CO2: 28 mmol/L (ref 22–32)
Calcium: 9.5 mg/dL (ref 8.9–10.3)
Chloride: 115 mmol/L — ABNORMAL HIGH (ref 98–111)
Creatinine, Ser: 1.45 mg/dL — ABNORMAL HIGH (ref 0.61–1.24)
GFR, Estimated: 48 mL/min — ABNORMAL LOW (ref >60)
Glucose, Bld: 171 mg/dL — ABNORMAL HIGH (ref 70–99)
Potassium: 3.8 mmol/L (ref 3.5–5.1)
Sodium: 153 mmol/L — ABNORMAL HIGH (ref 135–145)
Total Bilirubin: 2.5 mg/dL — ABNORMAL HIGH (ref 0.3–1.2)
Total Protein: 5.3 g/dL — ABNORMAL LOW (ref 6.5–8.1)

## 2022-03-30 LAB — GLUCOSE, CAPILLARY
Glucose-Capillary: 190 mg/dL — ABNORMAL HIGH (ref 70–99)
Glucose-Capillary: 179 mg/dL — ABNORMAL HIGH (ref 70–99)
Glucose-Capillary: 161 mg/dL — ABNORMAL HIGH (ref 70–99)
Glucose-Capillary: 196 mg/dL — ABNORMAL HIGH (ref 70–99)
Glucose-Capillary: 169 mg/dL — ABNORMAL HIGH (ref 70–99)
Glucose-Capillary: 145 mg/dL — ABNORMAL HIGH (ref 70–99)

## 2022-03-30 LAB — CBC WITH DIFFERENTIAL/PLATELET
Abs Immature Granulocytes: 0.04 10*3/uL (ref 0.00–0.07)
Basophils Absolute: 0 10*3/uL (ref 0.0–0.1)
Basophils Relative: 1 %
Eosinophils Absolute: 0.2 10*3/uL (ref 0.0–0.5)
Eosinophils Relative: 3 %
HCT: 29.6 % — ABNORMAL LOW (ref 39.0–52.0)
Hemoglobin: 9 g/dL — ABNORMAL LOW (ref 13.0–17.0)
Immature Granulocytes: 1 %
Lymphocytes Relative: 12 %
Lymphs Abs: 0.8 10*3/uL (ref 0.7–4.0)
MCH: 31.7 pg (ref 26.0–34.0)
MCHC: 30.4 g/dL (ref 30.0–36.0)
MCV: 104.2 fL — ABNORMAL HIGH (ref 80.0–100.0)
Monocytes Absolute: 0.7 10*3/uL (ref 0.1–1.0)
Monocytes Relative: 11 %
Neutro Abs: 4.7 10*3/uL (ref 1.7–7.7)
Neutrophils Relative %: 72 %
Platelets: 98 10*3/uL — ABNORMAL LOW (ref 150–400)
RBC: 2.84 MIL/uL — ABNORMAL LOW (ref 4.22–5.81)
RDW: 25.7 % — ABNORMAL HIGH (ref 11.5–15.5)
WBC: 6.5 10*3/uL (ref 4.0–10.5)
nRBC: 0.6 % — ABNORMAL HIGH (ref 0.0–0.2)

## 2022-03-30 LAB — MAGNESIUM: Magnesium: 2 mg/dL (ref 1.7–2.4)

## 2022-03-30 LAB — APTT: aPTT: 42 seconds — ABNORMAL HIGH (ref 24–36)

## 2022-03-30 LAB — PROTIME-INR
INR: 1.3 — ABNORMAL HIGH (ref 0.8–1.2)
Prothrombin Time: 16.3 seconds — ABNORMAL HIGH (ref 11.4–15.2)

## 2022-03-30 LAB — PHOSPHORUS: Phosphorus: 2.7 mg/dL (ref 2.5–4.6)

## 2022-03-30 MED ORDER — CHLORHEXIDINE GLUCONATE CLOTH 2 % EX PADS
6.0000 | MEDICATED_PAD | Freq: Every day | CUTANEOUS | Status: DC
  Administered 2022-03-30 – 2022-04-04 (×6): 6 via TOPICAL

## 2022-03-30 NOTE — Evaluation (Signed)
Clinical/Bedside Swallow Evaluation Patient Details  Name: [name] MRN: [id number] Date of Birth: 01/02/1941  Today's Date: 03/30/2022 Time: SLP Start Time (ACUTE ONLY): 1124 SLP Stop Time (ACUTE ONLY): 1138 SLP Time Calculation (min) (ACUTE ONLY): 14 min  Past Medical History:  Past Medical History:  Diagnosis Date   Acute renal failure ([place])    Aortic atherosclerosis ([place])    Benign prostatic hypertrophy    Blood transfusion without reported diagnosis    CHF (congestive heart failure) (HCC)    a. EF 20-25% by echo in 11/2019   COPD (chronic obstructive pulmonary disease) ([place]) 12/15/2009   FEV1 2.30 (70%) ratio 63 no better with B2 and DLCO 18/6 (73%) corrects to 106%   Degenerative joint disease    Depression    Diverticulosis    Duodenal varices    Esophageal varices (HCC)    Essential hypertension    ACE inhibitor cough   Fatty liver    Gastric ulcer    Hepatic cirrhosis (HCC)    Hepatocellular carcinoma (HCC)    Hyperlipidemia    IDA (iron deficiency anemia)    Internal hemorrhoids    Liver cancer ([place])    Low back pain    Otitis externa 07/30/2012   Pneumonia    Portal hypertensive gastropathy (HCC)    SBP (spontaneous bacterial peritonitis) ([place])    Sepsis ([place])    Splenomegaly    Thrombocytopenia (HCC)    Type 2 diabetes mellitus ([place])    no meds   Ulcer of foot ([place])    Right foot   Past Surgical History:  Past Surgical History:  Procedure Laterality Date   BIOPSY  07/14/2021   Procedure: BIOPSY;  Surgeon: Daryel November, MD;  Location: [facility];  Service: Gastroenterology;;  EGD and COLON   BIOPSY  02/03/2022   Procedure: BIOPSY;  Surgeon: Mauri Pole, MD;  Location: [facility];  Service: Gastroenterology;;   COLONOSCOPY  2020   JMP-MAC-plenvu (good)-polyps-recall 1 yr   COLONOSCOPY WITH PROPOFOL N/A 07/14/2021   Procedure: COLONOSCOPY WITH PROPOFOL;  Surgeon: Daryel November, MD;  Location: [facility];  Service:  Gastroenterology;  Laterality: N/A;   ESOPHAGOGASTRODUODENOSCOPY (EGD) WITH PROPOFOL N/A 07/14/2021   Procedure: ESOPHAGOGASTRODUODENOSCOPY (EGD) WITH PROPOFOL;  Surgeon: Daryel November, MD;  Location: [place];  Service: Gastroenterology;  Laterality: N/A;   ESOPHAGOGASTRODUODENOSCOPY (EGD) WITH PROPOFOL N/A 02/03/2022   Procedure: ESOPHAGOGASTRODUODENOSCOPY (EGD) WITH PROPOFOL;  Surgeon: Mauri Pole, MD;  Location: [facility];  Service: Gastroenterology;  Laterality: N/A;   EYE SURGERY     HEMOSTASIS CLIP PLACEMENT  02/03/2022   Procedure: HEMOSTASIS CLIP PLACEMENT;  Surgeon: Mauri Pole, MD;  Location: [facility];  Service: Gastroenterology;;   IR EMBO TUMOR ORGAN ISCHEMIA INFARCT INC GUIDE ROADMAPPING  06/01/2020   IR INTRAVASCULAR ULTRASOUND NON CORONARY  03/18/2022   IR PARACENTESIS  08/30/2021   IR PARACENTESIS  09/22/2021   IR PARACENTESIS  10/12/2021   IR PARACENTESIS  11/02/2021   IR PARACENTESIS  11/15/2021   IR PARACENTESIS  11/30/2021   IR PARACENTESIS  12/14/2021   IR PARACENTESIS  12/27/2021   IR PARACENTESIS  01/04/2022   IR PARACENTESIS  01/12/2022   IR PARACENTESIS  01/19/2022   IR PARACENTESIS  02/02/2022   IR PARACENTESIS  02/16/2022   IR PARACENTESIS  02/28/2022   IR PARACENTESIS  03/15/2022   IR PARACENTESIS  03/18/2022   IR RADIOLOGIST EVAL & MGMT  04/28/2020   IR RADIOLOGIST EVAL & MGMT  07/15/2020   IR RADIOLOGIST EVAL & MGMT  05/04/2021   IR TIPS  03/18/2022   IR US GUIDE VASC ACCESS RIGHT  03/18/2022   IR US GUIDE VASC ACCESS RIGHT  03/18/2022   RADIOLOGY WITH ANESTHESIA N/A 03/18/2022   Procedure: TIPS;  Surgeon: Suzette Battiest, MD;  Location: [facility];  Service: Radiology;  Laterality: N/A;   HPI:  Pt is an 81 year old male who presented to the ED for weakness, decreased mobility and poor PO intake s/p fall 3 days PTA. Pt admitted with shock, AKI;  s/p paracentesis with 20 ml fluid removed. Cortrak placed 6/16 on which date pt documented  to be minimall responsive. Per referring MD's note 6/19, pt's family considering comfort care if there is further decline in clinical statu and overall prognosis judged to be poor; PMT consulted 6/19. PMH: HTN, HLD, mild AS, HFrEF (EF as low as 20-25%, recovered with Echo 02/2022 EF 55-60%, G1DD), COPD, prior tobacco use, T2DM, NASH cirrhosis with portal hypertension as evidenced by EV, PHG, ascites requiring frequent paracentesis (c/b SBP) and HCC (s/p radiation), gastric/duodenal ulcers and diverticulosis, IDA. Pt recently underwent TIPS placement with IR 6/9, monitored x 24H and discharged home 6/10. BSE 6/19 recommending NPO. Pt non responsive 6/20 and ST signed off. On 6/21 he was more responsive, requesting water and ST reconsulted.    Assessment / Plan / Recommendation  Clinical Impression  Pt more alert today and bedside was reordered with daughter at bedside to assist in determining GOC of comfort versus initiating po's. Subtle signs of aspiration with thin cup and straw marked by throat clearing. Vocal quality clear throughout. Unable to determine with clincial observation level of delay (oral versus pharyngeal). He was able to follow commands and engage in social conversation. Discussed with daughter recommendation of initiating Dys 1 (puree), thin, crush meds (or in Cortrak) versus objective study. Educated daughter re: general aspiration precautions. ST will follow. SLP Visit Diagnosis: Dysphagia, unspecified (R13.10)    Aspiration Risk  Moderate aspiration risk    Diet Recommendation Dysphagia 1 (Puree);Thin liquid   Liquid Administration via: Straw;Cup Medication Administration: Via alternative means (or crushed) Supervision: Full supervision/cueing for compensatory strategies Compensations: Slow rate;Small sips/bites;Minimize environmental distractions Postural Changes: Seated upright at 90 degrees    Other  Recommendations Oral Care Recommendations: Oral care BID     Recommendations for follow up therapy are one component of a multi-disciplinary discharge planning process, led by the attending physician.  Recommendations may be updated based on patient status, additional functional criteria and insurance authorization.  Follow up Recommendations  (TBD)      Assistance Recommended at Discharge  (TBD)  Functional Status Assessment Patient has had a recent decline in their functional status and demonstrates the ability to make significant improvements in function in a reasonable and predictable amount of time.  Frequency and Duration min 2x/week  2 weeks       Prognosis Prognosis for Safe Diet Advancement:  (fair-good)      Swallow Study   General Date of Onset: 03/22/22 HPI: Pt is an 81 year old male who presented to the ED for weakness, decreased mobility and poor PO intake s/p fall 3 days PTA. Pt admitted with shock, AKI;  s/p paracentesis with 20 ml fluid removed. Cortrak placed 6/16 on which date pt documented to be minimall responsive. Per referring MD's note 6/19, pt's family considering comfort care if there is further decline in clinical statu and overall prognosis judged to be poor; PMT consulted 6/19.  PMH: HTN, HLD, mild AS, HFrEF (EF as low as 20-25%, recovered with Echo 02/2022 EF 55-60%, G1DD), COPD, prior tobacco use, T2DM, NASH cirrhosis with portal hypertension as evidenced by EV, PHG, ascites requiring frequent paracentesis (c/b SBP) and HCC (s/p radiation), gastric/duodenal ulcers and diverticulosis, IDA. Pt recently underwent TIPS placement with IR 6/9, monitored x 24H and discharged home 6/10. BSE 6/19 recommending NPO. Pt non responsive 6/20 and ST signed off. On 6/21 he was more responsive, requesting water and ST reconsulted. Type of Study: Bedside Swallow Evaluation Previous Swallow Assessment:  (see HPI) Diet Prior to this Study: NPO;NG Tube Temperature Spikes Noted: No Respiratory Status: Room air History of Recent Intubation:  No Behavior/Cognition: Alert;Cooperative;Pleasant mood;Requires cueing Oral Cavity Assessment: Dry Oral Care Completed by SLP: Recent completion by staff Oral Cavity - Dentition: Edentulous (dentures at home) Vision: Functional for self-feeding Self-Feeding Abilities: Total assist Patient Positioning: Upright in bed Baseline Vocal Quality: Low vocal intensity    Oral/Motor/Sensory Function Overall Oral Motor/Sensory Function: Within functional limits   Ice Chips Ice chips: Impaired Pharyngeal Phase Impairments: Suspected delayed Swallow   Thin Liquid Thin Liquid: Impaired Presentation: Cup;Straw Pharyngeal  Phase Impairments: Suspected delayed Swallow    Nectar Thick Nectar Thick Liquid: Not tested   Honey Thick Honey Thick Liquid: Not tested   Puree Puree: Impaired Pharyngeal Phase Impairments: Suspected delayed Swallow   Solid     Solid: Not tested      Houston Siren 03/30/2022,12:26 PM

## 2022-03-30 NOTE — Progress Notes (Signed)
Daily Progress Note   Patient Name: [name]       Date: 03/30/2022 DOB: Sep 16, 1941  Age: 81 y.o. MRN#: [id number] Attending Physician: Dwyane Dee, MD Primary Care Physician: [name], MD Admit Date: 03/22/2022  Reason for Consultation/Follow-up: Establishing goals of care  Subjective: Chart review performed. Received report from primary RN - no acute concerns. RN reports patient is more awake and alert today. Patient has had bites of applesauce and sips of water.   Went to visit patient at bedside - no family/visitors present. Patient was lying in bed awake, alert, oriented to name only, and able to participate in minimal/simple conversation. He does not seem to be able to make complex medical decisions. He feels "tired." Denies nausea. Endorses pain "all over." Offered additional bites of applesauce from bedside table, he declined. Speech is very mumbled. He is ill and frail appearing. Coretrak in use.  Requested RN provided pain medication.   Called daughter/[name] for continued [place] discussions. Reviewed updates she received from attending. Reviewed RN and my assessment from today. She expresses happiness that patient is responsive today, accepting bites and sips, and is hopeful he can continue to improve. Reviewed that even with improvement, he would likely still remain hospice appropriate and is at high risk for rehospitalization. Natural disease trajectory at EOL reviewed. [name] expresses understanding. Discussed allowing another 48 hours of watchful waiting and making step-wise decisions pending clinical course. She is agreeable for PMT follow up Friday for continued GOC.   All questions and concerns addressed. Encouraged to call with questions and/or concerns. PMT card  previously provided.  Length of Stay: 8  Current Medications: Scheduled Meds:   feeding supplement (PROSource TF)  45 mL Per Tube TID   Gerhardt's butt cream   Topical BID   heparin  5,000 Units Subcutaneous Q8H   insulin aspart  0-9 Units Subcutaneous Q4H   insulin glargine-yfgn  5 Units Subcutaneous Daily   lactulose  20 g Per Tube TID   mouth rinse  15 mL Mouth Rinse 4 times per day   pantoprazole (PROTONIX) IV  40 mg Intravenous Q12H   rifaximin  550 mg Per Tube BID    Continuous Infusions:  sodium chloride Stopped (03/25/22 1154)   cefTRIAXone (ROCEPHIN)  IV Stopped (03/29/22 2259)   feeding supplement (OSMOLITE 1.5  CAL) 50 mL/hr at 03/30/22 0659    PRN Meds: albuterol, LORazepam, ondansetron (ZOFRAN) IV, mouth rinse, oxyCODONE  Physical Exam Vitals and nursing note reviewed.  Constitutional:      General: He is not in acute distress. Pulmonary:     Effort: No respiratory distress.  Skin:    General: Skin is warm and dry.  Neurological:     Mental Status: He is alert. He is disoriented and confused.     Motor: Weakness present.  Psychiatric:        Attention and Perception: Attention normal.        Speech: Speech is slurred.        Behavior: Behavior is cooperative.        Cognition and Memory: Cognition is impaired. Memory is impaired.             Vital Signs: BP 115/70 (BP Location: Right Arm)   Pulse (!) 107   Temp 97.6 F (36.4 C) (Tympanic)   Resp 18   Ht '5\' 10"'$  (1.778 m)   Wt 78.1 kg   SpO2 94%   BMI 24.71 kg/m  SpO2: SpO2: 94 % O2 Device: O2 Device: Room Air O2 Flow Rate: O2 Flow Rate (L/min): 0 L/min  Intake/output summary:  Intake/Output Summary (Last 24 hours) at 03/30/2022 1213 Last data filed at 03/30/2022 1610 Gross per 24 hour  Intake 3085.23 ml  Output 1225 ml  Net 1860.23 ml   LBM: Last BM Date : 03/29/22 Baseline Weight: Weight: 79 kg Most recent weight: Weight: 78.1 kg       Palliative Assessment/Data: PPS 30% with tube  feeds      Patient Active Problem List   Diagnosis Date Noted   Failure to thrive in adult 03/29/2022   Pressure injury of skin 03/23/2022   Acute renal failure (HCC)    Shock (HCC)    Hypotension 03/22/2022   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   Melena    Chronic gastric ulcer with hemorrhage    Varices of small intestine    Recurrent major depressive disorder, in full remission ([place]) 11/16/2021   Protein-calorie malnutrition, severe 11/05/2021   Sepsis ([place]) 11/04/2021   Acute lower UTI 11/04/2021   Other ascites    Sepsis due to Spontaneous bacterial peritonitis ([place]) 11/03/2021   Malnutrition of moderate degree 07/14/2021   Other cirrhosis of liver (HCC)    Mucosal abnormality of stomach    Portal hypertensive gastropathy (HCC)    Secondary esophageal varices without bleeding (HCC)    Generalized weakness 07/13/2021   Chronic diarrhea 07/13/2021   Diarrhea due to malabsorption 07/13/2021   Iron deficiency anemia due to chronic blood loss 04/19/2021   Hepatocellular carcinoma ([place]) 06/01/2020   Anxiety 96/01/5408   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[place]) 12/14/2019   Aortic atherosclerosis ([place]) 81/19/1478   Chronic systolic heart failure ([place]) 12/05/2019   Macrocytic anemia 03/05/2018   GERD (gastroesophageal reflux disease) 09/30/2017   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 positive 03/29/2016   Thrombocytopenia ([place]) 12/21/2015   B12 deficiency 08/20/2015   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   Diabetic polyneuropathy associated with type 2 diabetes mellitus ([place]) 08/07/2015   Diplopia 06/05/2015   Stage 3b chronic kidney disease ([place]) 05/06/2015   Fatty liver 11/04/2014   Candidal balanoposthitis 06/20/2014   PCO (posterior capsular opacification) 06/19/2013   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 Off narcotics 06/05/17 due to negative UDS for opiates x2. Full history 06/12/14. Pain contract signed.  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[place]) 04/06/2007   Hyperlipidemia associated with type 2 diabetes mellitus ([place]) 04/06/2007   Essential hypertension 04/06/2007   Osteoarthritis 04/06/2007    Palliative Care Assessment & Plan   Patient Profile: 81 y.o. male  with past medical history of  HTN, HLD, AS, HFrEF (EF as low as 20-25%, recovered with Echo 02/2022 EF 55-60%, G1DD), COPD, DM2,HCC (s/p radiation), gastric/duodenal ulcers and diverticulosis, IDA, Cirrhosis w/w NASH, portal hypertension admitted on 03/22/2022 with weakness, decreased mobility, poor p.o. intake status post fall.    Patient was discharged home on 6/10 after TIPS placement.  Has decline rapidly since return home.  Was initially on pressors.  PMT has been consulted to assist with goals of care conversation.  Assessment: Principal Problem:   Hypotension Active Problems:   Other cirrhosis of liver (HCC)   Acute renal failure (HCC)   Shock (HCC)   Pressure injury of skin   Failure to thrive in adult   Concern about end of life  Recommendations/Plan: Now that patient is more interactive, goal is to continue to treat the treatable with 48 hours of watchful waiting Continue DNR/DNI as previously documented - durable DNR form completed and placed in shadow chart. Copy was made and will be scanned into Vynca/ACP tab PMT will follow up Friday 6/23 for continued [place] pending clinical course  Goals of Care and Additional Recommendations: Limitations on Scope of Treatment: Full Scope Treatment  Code Status:    Code Status Orders  (From admission, onward)            Start     Ordered   03/24/22 1038  Do not attempt resuscitation (DNR)  Continuous       Question Answer Comment  In the event of cardiac or respiratory ARREST Do not call a "code blue"   In the event of cardiac or respiratory ARREST Do not perform Intubation, CPR, defibrillation or ACLS   In the event of cardiac or respiratory ARREST Use medication by any route, position, wound care, and other measures to relive pain and suffering. May use oxygen, suction and manual treatment of airway obstruction as needed for comfort.      03/24/22 1038           Code Status History     Date Active Date Inactive Code Status Order ID Comments User Context   03/22/2022 2355 03/24/2022 1038 Full Code [id number]  Rhae Lerner Inpatient   03/22/2022 2224 03/22/2022 2242 DNR [id number]  Wyvonnia Dusky, MD ED   11/06/2021 0746 11/07/2021 1957 DNR [id number]  Rosezella Rumpf, NP Inpatient   11/03/2021 1420 11/06/2021 0746 Full Code [id number]  Norval [name], MD ED   07/13/2021 0310 07/19/2021 2038 Full Code [id number]  Kristopher Oppenheim, DO ED   07/13/2021 0041 07/13/2021 0310 Full Code [id number]  Kristopher Oppenheim, DO ED   12/04/2019 0210 12/08/2019 1913 Full Code [id number]  Bonnell Public, MD Inpatient       Prognosis:  Unable to determine  Discharge Planning: To Be Determined  Care plan was discussed with primary RN, Dr. Sabino Gasser, patient's  daughter  Thank you for allowing the Palliative Medicine Team to assist in the care of this patient.   Total Time 45 minutes Prolonged Time Billed  no       Greater than 50%  of this time was spent counseling and coordinating care related to the above assessment and plan.  Lin Landsman, NP  Please contact Palliative Medicine Team phone at [phone] for questions and concerns.   *Portions of this note are a verbal dictation therefore any spelling and/or grammatical errors are due to the "[employer]" system interpretation.

## 2022-03-30 NOTE — Progress Notes (Signed)
Progress Note    [name]   QMG:[id number]  DOB: [birth year]  DOA: 03/22/2022     8 PCP: [name], MD  Initial CC: weakness  Hospital Course: 81 year old man who presented to [facility] 6/13 for weakness, decreased mobility and poor PO intake s/p fall 3 days PTA. PMHx significant for HTN, HLD, mild AS, HFrEF (EF as low as 20-25%, recovered with Echo 02/2022 EF 55-60%, G1DD), COPD, prior tobacco use, T2DM, NASH cirrhosis with portal hypertension as evidenced by EV, PHG, ascites requiring frequent paracentesis (c/b SBP) and HCC (s/p radiation), gastric/duodenal ulcers and diverticulosis, IDA. Recently underwent TIPS placement with IR 6/9 with gradient reduction from 13 to 2. Monitored x 24H and discharged home 6/10.   Patient reportedly had a fall 6/10 shortly after arriving home, sustaining a L arm injury. Since injury, patient's mobility has been decreased and appetite has been poor; Additionally reporting decreased UOP and constipation. Patient was advised by IR to present to ED for further evaluation. Presented to [facility] ED 6/13 for weakness, decreased mobility and poor PO intake s/p fall. Vitals on ED arrival were notable for BP 89/35. Labs notable for new transaminitis with AST 545/ALT 293, Cr 4.78 (baseline 1.3-1.8), INR 1.5, WBC/LA WNL. Hgb 8.7, near baseline. CXR with small L pleural effusion. CT C-spine and L arm/shoulder imaging negative. Fluid resuscitation was initiated (3L) with slight improvement in SBPs to 80s. RIJ CVC placed and Levophed was initiated for additional BP support. PCCM consulted for ICU admission/transfer to [facility].    Transferred to [facility] for continued care.  Family wants no escalation of care.  Continue medical support in hopes of improvement.  Considering comfort care if there is a further decline in clinical status.  No HD, vasopressors, ventilator or CPR. Overall prognosis poor.  Interval History:  No events overnight.  This morning he is actually a little more  awake and alert and carrying on conversation.  He was asking for food and wanting to eat.  Son and daughter were also present bedside this morning and we discussed next steps.  Since he is more improved today, we will try to pursue SLP eval and initiating diet if able; if he passes we will continue trying to recover and rehab him.  If he does poorly, may still need to consider transition to hospice, but regardless would benefit from having hospice involved at discharge.  Assessment and Plan:  Hepatic encephalopathy:  - Continue lactulose, and rifaximin.  -Continue lactulose, will adjust as necessary. - Continue rectal tube   Dysphagia Feeding tube in place.  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  Appreciate palliative care assistance - Continue GOC discussions; for now he's had some improvement therefore will try to remain appropriately aggressive in pursuing recovery; if does decline again, will still need to consider hospice - would still recommend hospice at discharge regardless   AKI - Family declines dialysis.  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  Urine output improving with daily lasix challenges.   - Strict I's and O's.   Shock: - resolved   Improved. Multifactorial. Vasopressors off.  Could not rule out sepsis.  Being empirically treated for SBP.  Continue Rocephin and complete 7 day course (started 6/14).  Peritoneal fluid cultures negative, blood and urine cultures negative.   Hepatocellular carcinoma status post SBRT: No  acute treatment.   HFrEF: EF improved to 55 to 60%, previously about 20%: Holding home guideline directed medical therapy including Entresto, Coreg due to shock state and acute renal failure   COPD: Not in acute exacerbation   Thrombocytopenia, elevated INR: Secondary to liver  cirrhosis: Monitor platelets, hemoglobin.  No signs of bleeding   Type 2 diabetes mellitus: Continue SSI.  CBGs every 4 hours. Will add semglee 5 qd for persistent hyperglycmemia   Gastric and duodenal ulcers: Continue PPI   Depression/anxiety: Holding home Lexapro and BuSpar   Old records reviewed in assessment of this patient  Antimicrobials: Rocephin 03/27/2022 >> current  DVT prophylaxis:  heparin injection 5,000 Units Start: 03/23/22 1200 SCDs Start: 03/22/22 2351   Code Status:   Code Status: DNR  Disposition Plan:  pending further [place] discussions Status is: Inpt  Objective: Blood pressure 115/70, pulse (!) 107, temperature 97.6 F (36.4 C), temperature source Tympanic, resp. rate 18, height '5\' 10"'$  (1.778 m), weight 78.1 kg, SpO2 94 %.  Examination:  Physical Exam Constitutional:      Comments: Elderly gentleman lying in bed now more awake and alert, speaking more easily  HENT:     Head: Normocephalic and atraumatic.     Mouth/Throat:     Mouth: Mucous membranes are dry.  Eyes:     Pupils: Pupils are equal, round, and reactive to light.  Cardiovascular:     Rate and Rhythm: Normal rate and regular rhythm.  Pulmonary:     Effort: Pulmonary effort is normal.     Breath sounds: Normal breath sounds.  Abdominal:     General: Bowel sounds are normal.     Palpations: Abdomen is soft.  Musculoskeletal:        General: Normal range of motion.     Cervical back: Normal range of motion.  Skin:    General: Skin is warm and dry.  Neurological:     Comments: Follows some commands and moves all 4 extremities      Consultants:  Palliative care  Procedures:    Data Reviewed: Results for orders placed or performed during the hospital encounter of 03/22/22 (from the past 24 hour(s))  Glucose, capillary     Status: Abnormal   Collection Time: 03/29/22  3:29 PM  Result Value Ref Range   Glucose-Capillary 203 (H) 70 - 99 mg/dL  Glucose, capillary     Status:  Abnormal   Collection Time: 03/29/22  7:48 PM  Result Value Ref Range   Glucose-Capillary 164 (H) 70 - 99 mg/dL  Glucose, capillary     Status: Abnormal   Collection Time: 03/29/22 11:14 PM  Result Value Ref Range   Glucose-Capillary 184 (H) 70 - 99 mg/dL   Comment 1 Notify RN    Comment 2 Document in Chart   Glucose, capillary     Status: Abnormal   Collection Time: 03/30/22  3:54 AM  Result Value Ref Range   Glucose-Capillary 190 (H) 70 - 99 mg/dL   Comment 1 QC Due    Comment 2 Notify RN    Comment 3 Document in Chart   CBC with Differential/Platelet     Status: Abnormal   Collection Time: 03/30/22  4:06 AM  Result Value Ref Range   WBC 6.5 4.0 - 10.5 K/uL   RBC 2.84 (L) 4.22 - 5.81 MIL/uL   Hemoglobin 9.0 (L) 13.0 - 17.0 g/dL   HCT 29.6 (L) 39.0 - 52.0 %   MCV 104.2 (H) 80.0 -  100.0 fL   MCH 31.7 26.0 - 34.0 pg   MCHC 30.4 30.0 - 36.0 g/dL   RDW 25.7 (H) 11.5 - 15.5 %   Platelets 98 (L) 150 - 400 K/uL   nRBC 0.6 (H) 0.0 - 0.2 %   Neutrophils Relative % 72 %   Neutro Abs 4.7 1.7 - 7.7 K/uL   Lymphocytes Relative 12 %   Lymphs Abs 0.8 0.7 - 4.0 K/uL   Monocytes Relative 11 %   Monocytes Absolute 0.7 0.1 - 1.0 K/uL   Eosinophils Relative 3 %   Eosinophils Absolute 0.2 0.0 - 0.5 K/uL   Basophils Relative 1 %   Basophils Absolute 0.0 0.0 - 0.1 K/uL   WBC Morphology MORPHOLOGY UNREMARKABLE    RBC Morphology See Note    Smear Review MORPHOLOGY UNREMARKABLE    Immature Granulocytes 1 %   Abs Immature Granulocytes 0.04 0.00 - 0.07 K/uL   Polychromasia PRESENT   Comprehensive metabolic panel     Status: Abnormal   Collection Time: 03/30/22  4:06 AM  Result Value Ref Range   Sodium 153 (H) 135 - 145 mmol/L   Potassium 3.8 3.5 - 5.1 mmol/L   Chloride 115 (H) 98 - 111 mmol/L   CO2 28 22 - 32 mmol/L   Glucose, Bld 171 (H) 70 - 99 mg/dL   BUN 61 (H) 8 - 23 mg/dL   Creatinine, Ser 1.45 (H) 0.61 - 1.24 mg/dL   Calcium 9.5 8.9 - 10.3 mg/dL   Total Protein 5.3 (L) 6.5 - 8.1  g/dL   Albumin 2.8 (L) 3.5 - 5.0 g/dL   AST 53 (H) 15 - 41 U/L   ALT 56 (H) 0 - 44 U/L   Alkaline Phosphatase 348 (H) 38 - 126 U/L   Total Bilirubin 2.5 (H) 0.3 - 1.2 mg/dL   GFR, Estimated 48 (L) >60 mL/min   Anion gap 10 5 - 15  Phosphorus     Status: None   Collection Time: 03/30/22  4:06 AM  Result Value Ref Range   Phosphorus 2.7 2.5 - 4.6 mg/dL  Magnesium     Status: None   Collection Time: 03/30/22  4:06 AM  Result Value Ref Range   Magnesium 2.0 1.7 - 2.4 mg/dL  Protime-INR     Status: Abnormal   Collection Time: 03/30/22  4:06 AM  Result Value Ref Range   Prothrombin Time 16.3 (H) 11.4 - 15.2 seconds   INR 1.3 (H) 0.8 - 1.2  APTT     Status: Abnormal   Collection Time: 03/30/22  4:06 AM  Result Value Ref Range   aPTT 42 (H) 24 - 36 seconds  Glucose, capillary     Status: Abnormal   Collection Time: 03/30/22  7:08 AM  Result Value Ref Range   Glucose-Capillary 179 (H) 70 - 99 mg/dL  Glucose, capillary     Status: Abnormal   Collection Time: 03/30/22 11:08 AM  Result Value Ref Range   Glucose-Capillary 161 (H) 70 - 99 mg/dL   *Note: Due to a large number of results and/or encounters for the requested time period, some results have not been displayed. A complete set of results can be found in Results Review.    I have Reviewed nursing notes, Vitals, and Lab results since pt's last encounter. Pertinent lab results : see above I have ordered test including BMP, CBC, Mg I have reviewed the last note from staff over past 24 hours I have discussed pt's care  plan and test results with nursing staff, case manager   LOS: 8 days   Dwyane Dee, MD [facility] 03/30/2022, 3:14 PM

## 2022-03-30 NOTE — Telephone Encounter (Signed)
Contacted pt's daughter per Dr Irene Limbo : to let patient/family know that pt's recent MRI showed no evidence of progression of liver cancer.  We will schedule him to be be seen with [place] in clinic in 3 months with labs. Pt's daughter acknowledged and verbalized understanding.

## 2022-03-30 NOTE — Consult Note (Signed)
   [facility] Inpatient Consult   03/30/2022  CASY BRUNETTO 01-Sep-1941 [id number]  [facility] [ACO] Patient:  Primary Care Provider:  [name], MD, [place] at [facility], is an embedded provider with a Chronic Care Management team and program, and is listed for the transition of care follow up and appointments.  Reviewed for unplanned readmission less than 30 days per request as well to be sent. Patient was screened for Embedded practice service needs for chronic care management and progress notes reviewed for post hospital plan for needs.  Plan: Continue to follow for [facility] needs for post hospital needs. Review information sent.  Please contact for further questions,  Natividad Brood, RN BSN [facility] Liaison  [phone] business mobile phone Toll free office [phone]  Fax number: [phone] [email or web address]

## 2022-03-31 ENCOUNTER — Telehealth: Payer: Self-pay | Admitting: Hematology

## 2022-03-31 DIAGNOSIS — I959 Hypotension, unspecified: Secondary | ICD-10-CM | POA: Diagnosis not present

## 2022-03-31 DIAGNOSIS — K7469 Other cirrhosis of liver: Secondary | ICD-10-CM | POA: Diagnosis not present

## 2022-03-31 DIAGNOSIS — R627 Adult failure to thrive: Secondary | ICD-10-CM | POA: Diagnosis not present

## 2022-03-31 LAB — COMPREHENSIVE METABOLIC PANEL
ALT: 45 U/L — ABNORMAL HIGH (ref 0–44)
AST: 74 U/L — ABNORMAL HIGH (ref 15–41)
Albumin: 2.6 g/dL — ABNORMAL LOW (ref 3.5–5.0)
Alkaline Phosphatase: 332 U/L — ABNORMAL HIGH (ref 38–126)
Anion gap: 8 (ref 5–15)
BUN: 59 mg/dL — ABNORMAL HIGH (ref 8–23)
CO2: 29 mmol/L (ref 22–32)
Calcium: 9.1 mg/dL (ref 8.9–10.3)
Chloride: 113 mmol/L — ABNORMAL HIGH (ref 98–111)
Creatinine, Ser: 1.34 mg/dL — ABNORMAL HIGH (ref 0.61–1.24)
GFR, Estimated: 53 mL/min — ABNORMAL LOW (ref >60)
Glucose, Bld: 171 mg/dL — ABNORMAL HIGH (ref 70–99)
Potassium: 4.5 mmol/L (ref 3.5–5.1)
Sodium: 150 mmol/L — ABNORMAL HIGH (ref 135–145)
Total Bilirubin: 2.4 mg/dL — ABNORMAL HIGH (ref 0.3–1.2)
Total Protein: 5 g/dL — ABNORMAL LOW (ref 6.5–8.1)

## 2022-03-31 LAB — CBC WITH DIFFERENTIAL/PLATELET
Abs Immature Granulocytes: 0.02 10*3/uL (ref 0.00–0.07)
Basophils Absolute: 0 10*3/uL (ref 0.0–0.1)
Basophils Relative: 1 %
Eosinophils Absolute: 0.2 10*3/uL (ref 0.0–0.5)
Eosinophils Relative: 3 %
HCT: 27.4 % — ABNORMAL LOW (ref 39.0–52.0)
Hemoglobin: 8.4 g/dL — ABNORMAL LOW (ref 13.0–17.0)
Immature Granulocytes: 0 %
Lymphocytes Relative: 12 %
Lymphs Abs: 0.8 10*3/uL (ref 0.7–4.0)
MCH: 31.6 pg (ref 26.0–34.0)
MCHC: 30.7 g/dL (ref 30.0–36.0)
MCV: 103 fL — ABNORMAL HIGH (ref 80.0–100.0)
Monocytes Absolute: 0.5 10*3/uL (ref 0.1–1.0)
Monocytes Relative: 8 %
Neutro Abs: 4.8 10*3/uL (ref 1.7–7.7)
Neutrophils Relative %: 76 %
Platelets: 82 10*3/uL — ABNORMAL LOW (ref 150–400)
RBC: 2.66 MIL/uL — ABNORMAL LOW (ref 4.22–5.81)
RDW: 26.1 % — ABNORMAL HIGH (ref 11.5–15.5)
Smear Review: DECREASED
WBC Morphology: ABNORMAL
WBC: 6.3 10*3/uL (ref 4.0–10.5)
nRBC: 0.3 % — ABNORMAL HIGH (ref 0.0–0.2)

## 2022-03-31 LAB — GLUCOSE, CAPILLARY
Glucose-Capillary: 140 mg/dL — ABNORMAL HIGH (ref 70–99)
Glucose-Capillary: 141 mg/dL — ABNORMAL HIGH (ref 70–99)
Glucose-Capillary: 142 mg/dL — ABNORMAL HIGH (ref 70–99)
Glucose-Capillary: 146 mg/dL — ABNORMAL HIGH (ref 70–99)
Glucose-Capillary: 169 mg/dL — ABNORMAL HIGH (ref 70–99)
Glucose-Capillary: 198 mg/dL — ABNORMAL HIGH (ref 70–99)

## 2022-03-31 LAB — MAGNESIUM: Magnesium: 1.9 mg/dL (ref 1.7–2.4)

## 2022-03-31 MED ORDER — OSMOLITE 1.5 CAL PO LIQD
1000.0000 mL | ORAL | Status: DC
Start: 2022-03-31 — End: 2022-04-01
  Administered 2022-03-31 – 2022-04-01 (×2): 1000 mL
  Filled 2022-03-31: qty 1000

## 2022-03-31 MED ORDER — ENSURE ENLIVE PO LIQD
237.0000 mL | Freq: Two times a day (BID) | ORAL | Status: DC
  Administered 2022-03-31 – 2022-04-02 (×3): 237 mL via ORAL
  Filled 2022-03-31: qty 237

## 2022-03-31 NOTE — Telephone Encounter (Signed)
Scheduled appointment per inbasket message. Patient aware.   ?

## 2022-03-31 NOTE — Plan of Care (Signed)
  Problem: Education: Goal: Understanding of CV disease, CV risk reduction, and recovery process will improve Outcome: Not Progressing Goal: Individualized Educational Video(s) Outcome: Not Progressing   Problem: Activity: Goal: Ability to return to baseline activity level will improve Outcome: Not Progressing   Problem: Cardiovascular: Goal: Ability to achieve and maintain adequate cardiovascular perfusion will improve Outcome: Not Progressing Goal: Vascular access site(s) Level 0-1 will be maintained Outcome: Not Progressing   Problem: Health Behavior/Discharge Planning: Goal: Ability to safely manage health-related needs after discharge will improve Outcome: Not Progressing   Problem: Education: Goal: Knowledge of General Education information will improve Description: Including pain rating scale, medication(s)/side effects and non-pharmacologic comfort measures Outcome: Not Progressing   Problem: Health Behavior/Discharge Planning: Goal: Ability to manage health-related needs will improve Outcome: Not Progressing   Problem: Clinical Measurements: Goal: Ability to maintain clinical measurements within normal limits will improve Outcome: Not Progressing Goal: Will remain free from infection Outcome: Not Progressing Goal: Diagnostic test results will improve Outcome: Not Progressing Goal: Respiratory complications will improve Outcome: Not Progressing Goal: Cardiovascular complication will be avoided Outcome: Not Progressing   Problem: Activity: Goal: Risk for activity intolerance will decrease Outcome: Not Progressing   Problem: Nutrition: Goal: Adequate nutrition will be maintained Outcome: Not Progressing   Problem: Coping: Goal: Level of anxiety will decrease Outcome: Not Progressing   Problem: Elimination: Goal: Will not experience complications related to bowel motility Outcome: Not Progressing Goal: Will not experience complications related to urinary  retention Outcome: Not Progressing   Problem: Pain Managment: Goal: General experience of comfort will improve Outcome: Not Progressing   Problem: Safety: Goal: Ability to remain free from injury will improve Outcome: Not Progressing   Problem: Skin Integrity: Goal: Risk for impaired skin integrity will decrease Outcome: Not Progressing   Problem: Education: Goal: Ability to describe self-care measures that may prevent or decrease complications (Diabetes Survival Skills Education) will improve Outcome: Not Progressing Goal: Individualized Educational Video(s) Outcome: Not Progressing   Problem: Coping: Goal: Ability to adjust to condition or change in health will improve Outcome: Not Progressing   Problem: Fluid Volume: Goal: Ability to maintain a balanced intake and output will improve Outcome: Not Progressing   Problem: Health Behavior/Discharge Planning: Goal: Ability to identify and utilize available resources and services will improve Outcome: Not Progressing Goal: Ability to manage health-related needs will improve Outcome: Not Progressing   Problem: Metabolic: Goal: Ability to maintain appropriate glucose levels will improve Outcome: Not Progressing   Problem: Nutritional: Goal: Maintenance of adequate nutrition will improve Outcome: Not Progressing Goal: Progress toward achieving an optimal weight will improve Outcome: Not Progressing   Problem: Skin Integrity: Goal: Risk for impaired skin integrity will decrease Outcome: Not Progressing   Problem: Tissue Perfusion: Goal: Adequacy of tissue perfusion will improve Outcome: Not Progressing   Problem: Safety: Goal: Non-violent Restraint(s) Outcome: Not Progressing

## 2022-03-31 NOTE — Progress Notes (Signed)
. Progress Note    [name]   SRP:[id number]  DOB: 02/04/1941  DOA: 03/22/2022     9 PCP: [name], MD  Initial CC: weakness  Hospital Course: 81 year old man who presented to [facility] 6/13 for weakness, decreased mobility and poor PO intake s/p fall 3 days PTA. PMHx significant for HTN, HLD, mild AS, HFrEF (EF as low as 20-25%, recovered with Echo 02/2022 EF 55-60%, G1DD), COPD, prior tobacco use, T2DM, NASH cirrhosis with portal hypertension as evidenced by EV, PHG, ascites requiring frequent paracentesis (c/b SBP) and HCC (s/p radiation), gastric/duodenal ulcers and diverticulosis, IDA. Recently underwent TIPS placement with IR 6/9 with gradient reduction from 13 to 2. Monitored x 24H and discharged home 6/10.   Patient reportedly had a fall 6/10 shortly after arriving home, sustaining a L arm injury. Since injury, patient's mobility has been decreased and appetite has been poor; Additionally reporting decreased UOP and constipation. Patient was advised by IR to present to ED for further evaluation. Presented to [facility] ED 6/13 for weakness, decreased mobility and poor PO intake s/p fall. Vitals on ED arrival were notable for BP 89/35. Labs notable for new transaminitis with AST 545/ALT 293, Cr 4.78 (baseline 1.3-1.8), INR 1.5, WBC/LA WNL. Hgb 8.7, near baseline. CXR with small L pleural effusion. CT C-spine and L arm/shoulder imaging negative. Fluid resuscitation was initiated (3L) with slight improvement in SBPs to 80s. RIJ CVC placed and Levophed was initiated for additional BP support. PCCM consulted for ICU admission/transfer to [facility].    Transferred to [facility] for continued care.  Family wants no escalation of care.  Continue medical support in hopes of improvement.  Considering comfort care if there is a further decline in clinical status.  Interval History:  No events overnight.  Continues to be more awake and alert he today.  Family present bedside this morning and was also happy with  his improvement.  He is now eating a dysphagia diet with plan for nocturnal tube feeds.  We will also start getting PT/OT involved  Assessment and Plan:  Hepatic encephalopathy:  - Continue lactulose, and rifaximin.  -Continue lactulose, will adjust as necessary. - Continue rectal tube   Dysphagia Feeding tube in place.  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  Appreciate palliative care assistance - Continue GOC discussions; for now he's had some improvement therefore will try to remain appropriately aggressive in pursuing recovery; if does decline again, will still need to consider hospice - would still recommend hospice at discharge regardless   AKI - Family declines dialysis.  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  Urine output improving with daily lasix challenges.   - Strict I's and O's.   Shock: - resolved   Improved. Multifactorial. Vasopressors off.  Could not rule out sepsis.  Being empirically treated for SBP.  Continue Rocephin and complete 7 day course (started 6/14).  Peritoneal fluid cultures negative, blood and urine cultures negative.   Hepatocellular carcinoma status post SBRT: No acute treatment.   HFrEF: EF improved to 55 to 60%, previously about 20%: Holding home guideline directed medical therapy including Entresto, Coreg due to shock state and acute renal failure   COPD: Not in acute exacerbation   Thrombocytopenia, elevated INR: Secondary to liver cirrhosis: Monitor platelets, hemoglobin.  No signs of bleeding   Type 2  diabetes mellitus: Continue SSI.  CBGs every 4 hours. Will add semglee 5 qd for persistent hyperglycmemia   Gastric and duodenal ulcers: Continue PPI   Depression/anxiety: Holding home Lexapro and BuSpar   Old records reviewed in assessment of this  patient  Antimicrobials: Rocephin 03/27/2022 >> current  DVT prophylaxis:  heparin injection 5,000 Units Start: 03/23/22 1200 SCDs Start: 03/22/22 2351   Code Status:   Code Status: DNR  Disposition Plan:  pending further [place] discussions Status is: Inpt  Objective: Blood pressure (!) 107/58, pulse 90, temperature 98 F (36.7 C), temperature source Tympanic, resp. rate 17, height '5\' 10"'$  (1.778 m), weight 84.6 kg, SpO2 97 %.  Examination:  Physical Exam Constitutional:      Comments: Elderly gentleman lying in bed now more awake and alert, speaking more easily  HENT:     Head: Normocephalic and atraumatic.     Mouth/Throat:     Mouth: Mucous membranes are dry.  Eyes:     Pupils: Pupils are equal, round, and reactive to light.  Cardiovascular:     Rate and Rhythm: Normal rate and regular rhythm.  Pulmonary:     Effort: Pulmonary effort is normal.     Breath sounds: Normal breath sounds.  Abdominal:     General: Bowel sounds are normal.     Palpations: Abdomen is soft.  Musculoskeletal:        General: Normal range of motion.     Cervical back: Normal range of motion.  Skin:    General: Skin is warm and dry.  Neurological:     Comments: Follows some commands and moves all 4 extremities      Consultants:  Palliative care  Procedures:    Data Reviewed: Results for orders placed or performed during the hospital encounter of 03/22/22 (from the past 24 hour(s))  Glucose, capillary     Status: Abnormal   Collection Time: 03/30/22  4:01 PM  Result Value Ref Range   Glucose-Capillary 196 (H) 70 - 99 mg/dL  Glucose, capillary     Status: Abnormal   Collection Time: 03/30/22  8:03 PM  Result Value Ref Range   Glucose-Capillary 169 (H) 70 - 99 mg/dL   Comment 1 Notify RN    Comment 2 Document in Chart   Glucose, capillary     Status: Abnormal   Collection Time: 03/30/22 11:25 PM  Result Value Ref Range   Glucose-Capillary 145 (H) 70 - 99 mg/dL  CBC with  Differential/Platelet     Status: Abnormal   Collection Time: 03/31/22 12:50 AM  Result Value Ref Range   WBC 6.3 4.0 - 10.5 K/uL   RBC 2.66 (L) 4.22 - 5.81 MIL/uL   Hemoglobin 8.4 (L) 13.0 - 17.0 g/dL   HCT 27.4 (L) 39.0 - 52.0 %   MCV 103.0 (H) 80.0 - 100.0 fL   MCH 31.6 26.0 - 34.0 pg   MCHC 30.7 30.0 - 36.0 g/dL   RDW 26.1 (H) 11.5 - 15.5 %   Platelets 82 (L) 150 - 400 K/uL   nRBC 0.3 (H) 0.0 - 0.2 %   Neutrophils Relative % 76 %   Neutro Abs 4.8 1.7 - 7.7 K/uL   Lymphocytes Relative 12 %   Lymphs Abs 0.8 0.7 - 4.0 K/uL   Monocytes Relative 8 %   Monocytes Absolute 0.5 0.1 - 1.0 K/uL   Eosinophils Relative 3 %   Eosinophils Absolute 0.2 0.0 - 0.5 K/uL   Basophils Relative 1 %  Basophils Absolute 0.0 0.0 - 0.1 K/uL   WBC Morphology Abnormal lymphocytes present    Smear Review PLATELETS APPEAR DECREASED    Immature Granulocytes 0 %   Abs Immature Granulocytes 0.02 0.00 - 0.07 K/uL   Polychromasia PRESENT    Target Cells PRESENT   Comprehensive metabolic panel     Status: Abnormal   Collection Time: 03/31/22 12:50 AM  Result Value Ref Range   Sodium 150 (H) 135 - 145 mmol/L   Potassium 4.5 3.5 - 5.1 mmol/L   Chloride 113 (H) 98 - 111 mmol/L   CO2 29 22 - 32 mmol/L   Glucose, Bld 171 (H) 70 - 99 mg/dL   BUN 59 (H) 8 - 23 mg/dL   Creatinine, Ser 1.34 (H) 0.61 - 1.24 mg/dL   Calcium 9.1 8.9 - 10.3 mg/dL   Total Protein 5.0 (L) 6.5 - 8.1 g/dL   Albumin 2.6 (L) 3.5 - 5.0 g/dL   AST 74 (H) 15 - 41 U/L   ALT 45 (H) 0 - 44 U/L   Alkaline Phosphatase 332 (H) 38 - 126 U/L   Total Bilirubin 2.4 (H) 0.3 - 1.2 mg/dL   GFR, Estimated 53 (L) >60 mL/min   Anion gap 8 5 - 15  Magnesium     Status: None   Collection Time: 03/31/22 12:50 AM  Result Value Ref Range   Magnesium 1.9 1.7 - 2.4 mg/dL  Glucose, capillary     Status: Abnormal   Collection Time: 03/31/22  4:22 AM  Result Value Ref Range   Glucose-Capillary 141 (H) 70 - 99 mg/dL   Comment 1 Notify RN    Comment 2  Document in Chart   Glucose, capillary     Status: Abnormal   Collection Time: 03/31/22  7:16 AM  Result Value Ref Range   Glucose-Capillary 146 (H) 70 - 99 mg/dL  Glucose, capillary     Status: Abnormal   Collection Time: 03/31/22 11:31 AM  Result Value Ref Range   Glucose-Capillary 142 (H) 70 - 99 mg/dL   *Note: Due to a large number of results and/or encounters for the requested time period, some results have not been displayed. A complete set of results can be found in Results Review.    I have Reviewed nursing notes, Vitals, and Lab results since pt's last encounter. Pertinent lab results : see above I have ordered test including BMP, CBC, Mg I have reviewed the last note from staff over past 24 hours I have discussed pt's care plan and test results with nursing staff, case manager   LOS: 9 days   Dwyane Dee, MD [facility] 03/31/2022, 2:05 PM

## 2022-03-31 NOTE — Progress Notes (Signed)
Speech Language Pathology Treatment: Dysphagia  Patient Details Name: [name] MRN: [id number] DOB: 1941/10/08 Today's Date: 03/31/2022 Time: [id number] SLP Time Calculation (min) (ACUTE ONLY): 9 min  Assessment / Plan / Recommendation Clinical Impression  Pt alert, pleasant and conversive with Cortrak in place. Treatment focused on dysphagia after initiating puree and thins yesterday. RN tech reported he ate some of his breakfast, drank liquids well without overt s/sx aspiration and that he needed a few rest breaks. For this therapist he was able to [facility] and manipulate regular texture in timely fashion even without dentures (at home). Immediate soft throat clear after thin water and one cough after solid most likely particulates from cracker. Discussed remaining on puree texture versus upgrading to Dys 2 and was agreed upon to move to Dys 2 (minced) texture, pills crushed in puree, and assist with self feeding. If ok with RD, he may be able to have Cortrak removed.    HPI HPI: Pt is an 81 year old male who presented to the ED for weakness, decreased mobility and poor PO intake s/p fall 3 days PTA. Pt admitted with shock, AKI;  s/p paracentesis with 20 ml fluid removed. Cortrak placed 6/16 on which date pt documented to be minimall responsive. Per referring MD's note 6/19, pt's family considering comfort care if there is further decline in clinical statu and overall prognosis judged to be poor; PMT consulted 6/19. PMH: HTN, HLD, mild AS, HFrEF (EF as low as 20-25%, recovered with Echo 02/2022 EF 55-60%, G1DD), COPD, prior tobacco use, T2DM, NASH cirrhosis with portal hypertension as evidenced by EV, PHG, ascites requiring frequent paracentesis (c/b SBP) and HCC (s/p radiation), gastric/duodenal ulcers and diverticulosis, IDA. Pt recently underwent TIPS placement with IR 6/9, monitored x 24H and discharged home 6/10. BSE 6/19 recommending NPO. Pt non responsive 6/20 and ST signed off. On 6/21 he  was more responsive, requesting water and ST reconsulted.      SLP Plan  Continue with current plan of care      Recommendations for follow up therapy are one component of a multi-disciplinary discharge planning process, led by the attending physician.  Recommendations may be updated based on patient status, additional functional criteria and insurance authorization.    Recommendations  Diet recommendations: Dysphagia 2 (fine chop);Thin liquid Liquids provided via: Cup;Straw Medication Administration: Crushed with puree Supervision: Staff to assist with self feeding;Full supervision/cueing for compensatory strategies Compensations: Slow rate;Small sips/bites;Minimize environmental distractions Postural Changes and/or Swallow Maneuvers: Seated upright 90 degrees                Oral Care Recommendations: Oral care BID Follow Up Recommendations: No SLP follow up Assistance recommended at discharge: None SLP Visit Diagnosis: Dysphagia, unspecified (R13.10) Plan: Continue with current plan of care           Houston Siren  03/31/2022, 9:08 AM

## 2022-03-31 NOTE — Progress Notes (Signed)
Nutrition Follow-up  DOCUMENTATION CODES:   Non-severe (moderate) malnutrition in context of chronic illness  INTERVENTION:   Transition to nocturnal tube feeds via Cortrak: - Osmolite 1.5 @ 70 ml/hr x 12 hours from 6 PM to 6 AM (total of 840 ml) - ProSource TF 45 ml TID  Nocturnal tube feeding regimen provides 1380 kcal, 86 grams of protein, and 640 ml of H2O (meets 77% of minimum kcal needs and 86% of minimum protein needs).  - Ensure Enlive po BID, each supplement provides 350 kcal and 20 grams of protein  - Encourage PO intake and provide feeding assistance  NUTRITION DIAGNOSIS:   Moderate Malnutrition related to chronic illness (COPD, CHF, cirrhosis) as evidenced by moderate fat depletion, severe muscle depletion.  Ongoing, being addressed via diet advancement, oral nutrition supplements, and nocturnal TF  GOAL:   Patient will meet greater than or equal to 90% of their needs  Progressing  MONITOR:   Diet advancement, Labs, Weight trends, TF tolerance, Skin, I & O's  REASON FOR ASSESSMENT:   Consult Enteral/tube feeding initiation and management  ASSESSMENT:   81 year old male who presented to the ED on 6/13 after a fall. 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 Pt admitted with shock, AKI.  06/14 - s/p paracentesis with 20 ml fluid removed 06/15 - NG tube placed for administration of meds (tip gastric) 06/16 - NG tube exchanged for Cortrak (tip gastric) 06/21 - diet advanced to dysphagia 1 with thin liquids 06/22 - diet advanced to dysphagia 2 with thin liquids  Noted Palliative Care team plans to follow up with family on Friday for ongoing [place] discussions.  Spoke with pt at bedside. RN in room providing nursing care. Pt is more alert and oriented today compared to previous RD visit. Pt able to hold conversation and reports he is appreciative of the care he  is receiving. Pt confirms that he did eat better for breakfast today. Meal completion of 25% documented for breakfast meal today. Pt willing to try oral nutrition supplements. RD to order Ensure Plus High Protein between meals.  Discussed pt with RN, MD, and SLP. Will transition pt to nocturnal tube feeds to stimulate appetite and promote PO intake during the day. Will monitor for improvements in PO intake and adjust tube feeding regimen as appropriate.  Admit weight: 79 kg Current weight: 87.5 kg  Pt with non-pitting edema to RUE and BLE as well as moderate pitting edema to LUE.  Current TF: Osmolite 1.5 @ 50 ml/hr, ProSource TF 45 ml TID  Meal Completion: 25% x 2 documented meals since diet advanced  Medications reviewed and include: SSI q 4 hours, semglee 5 units daily, lactulose 20 grams TID, IV protonix  Labs reviewed: sodium 150, BUN 59, creatinine 1.34, elevated LFTs, hemoglobin 8.4, platelets 82 CBG's: 141-196 x  24 hours  UOP: 1850 ml x 24 hours I/O's: -2.0 L since admit  Diet Order:   Diet Order             DIET DYS 2 Room service appropriate? Yes; Fluid consistency: Thin  Diet effective now                   EDUCATION NEEDS:   Not appropriate for education at this time  Skin:  Skin Assessment: Skin Integrity Issues: Stage I: sacrum Other: skin tear to R arm and scrotum  Last BM:  03/31/22 rectal  tube  Height:   Ht Readings from Last 1 Encounters:  03/22/22 '5\' 10"'$  (1.778 m)    Weight:   Wt Readings from Last 1 Encounters:  03/31/22 84.6 kg    BMI:  Body mass index is 26.76 kg/m.  Estimated Nutritional Needs:   Kcal:  1800-2000  Protein:  100-115 grams  Fluid:  1.8 L    Gustavus Bryant, MS, RD, LDN Inpatient Clinical Dietitian Please see AMiON for contact information.

## 2022-04-01 DIAGNOSIS — I959 Hypotension, unspecified: Secondary | ICD-10-CM | POA: Diagnosis not present

## 2022-04-01 DIAGNOSIS — R627 Adult failure to thrive: Secondary | ICD-10-CM | POA: Diagnosis not present

## 2022-04-01 DIAGNOSIS — N179 Acute kidney failure, unspecified: Secondary | ICD-10-CM | POA: Diagnosis not present

## 2022-04-01 LAB — BASIC METABOLIC PANEL
Anion gap: 7 (ref 5–15)
BUN: 59 mg/dL — ABNORMAL HIGH (ref 8–23)
CO2: 29 mmol/L (ref 22–32)
Calcium: 8.6 mg/dL — ABNORMAL LOW (ref 8.9–10.3)
Chloride: 108 mmol/L (ref 98–111)
Creatinine, Ser: 1.2 mg/dL (ref 0.61–1.24)
GFR, Estimated: >60 mL/min (ref >60)
Glucose, Bld: 148 mg/dL — ABNORMAL HIGH (ref 70–99)
Potassium: 4.4 mmol/L (ref 3.5–5.1)
Sodium: 144 mmol/L (ref 135–145)

## 2022-04-01 LAB — MAGNESIUM: Magnesium: 1.9 mg/dL (ref 1.7–2.4)

## 2022-04-01 LAB — GLUCOSE, CAPILLARY
Glucose-Capillary: 162 mg/dL — ABNORMAL HIGH (ref 70–99)
Glucose-Capillary: 178 mg/dL — ABNORMAL HIGH (ref 70–99)
Glucose-Capillary: 152 mg/dL — ABNORMAL HIGH (ref 70–99)
Glucose-Capillary: 94 mg/dL (ref 70–99)
Glucose-Capillary: 224 mg/dL — ABNORMAL HIGH (ref 70–99)

## 2022-04-01 MED ORDER — OSMOLITE 1.5 CAL PO LIQD
840.0000 mL | ORAL | Status: DC
Start: 2022-04-02 — End: 2022-04-04
  Administered 2022-04-02: 840 mL
  Filled 2022-04-01: qty 948

## 2022-04-01 MED ORDER — NUTRISOURCE FIBER PO PACK
1.0000 | PACK | Freq: Two times a day (BID) | ORAL | Status: DC
Start: 2022-04-01 — End: 2022-04-04
  Administered 2022-04-01 – 2022-04-04 (×6): 1
  Filled 2022-04-01 (×8): qty 1

## 2022-04-01 MED ORDER — LACTULOSE 10 GM/15ML PO SOLN
10.0000 g | Freq: Two times a day (BID) | ORAL | Status: DC
  Administered 2022-04-01 – 2022-04-02 (×3): 10 g
  Filled 2022-04-01 (×3): qty 15

## 2022-04-01 NOTE — TOC Initial Note (Signed)
Transition of Care [facility]) - Initial/Assessment Note    Patient Details  Name: [name] MRN: [id number] Date of Birth: [birth year]  [facility] CM/SW Contact:    Lynett Grimes Phone Number: 04/01/2022, 4:58 PM  Clinical Narrative:                 CSW received SNF consult. CSW met with pt daughter via phone to discuss SNF. CSW introduced self and explained role at the hospital. Pt daughter reports that PTA the the pt lived at home with his spouse who has dementia. Pt is modA with a click score of 11.  CSW reviewed PT/OT recommendations for SNF. Pt understands that he needs a SNF to gain strength. Pt gave CSW permission to fax out to facilities in the area. Pt has no preference of facility at this time.   CSW will continue to follow.    Expected Discharge Plan:  (undetermined) Barriers to Discharge: Continued Medical Work up   Patient Goals and CMS Choice Patient states their goals for this hospitalization and ongoing recovery are:: Patient is confused unable to answer question CMS Medicare.gov Compare Post Acute Care list provided to::  (n/a) Choice offered to / list presented to : NA  Expected Discharge Plan and Services Expected Discharge Plan:  (undetermined) In-house Referral: Hospice / Palliative Care Discharge Planning Services: CM Consult Post Acute Care Choice: NA (to be determined) Living arrangements for the past 2 months: Single Family Home                 DME Arranged: N/A DME Agency: NA       HH Arranged: NA HH Agency: NA        Prior Living Arrangements/Services Living arrangements for the past 2 months: Single Family Home Lives with:: Spouse Patient language and need for interpreter reviewed:: Yes Do you feel safe going back to the place where you live?:  (n/a patient unable to verbalize answer confused)      Need for Family Participation in Patient Care: Yes (Comment) Care giver support system in place?: Yes (comment) Current  home services: DME (per son patient has several walkers) Criminal Activity/Legal Involvement Pertinent to Current Situation/Hospitalization: No - Comment as needed  Activities of Daily Living Home Assistive Devices/Equipment: Wheelchair, Environmental consultant (specify type), CBG Meter, Cane (specify quad or straight), Blood pressure cuff ADL Screening (condition at time of admission) Patient's cognitive ability adequate to safely complete daily activities?: No Is the patient deaf or have difficulty hearing?: No Does the patient have difficulty seeing, even when wearing glasses/contacts?: Yes Does the patient have difficulty concentrating, remembering, or making decisions?: Yes Patient able to express need for assistance with ADLs?: No Does the patient have difficulty dressing or bathing?: Yes Independently performs ADLs?: No Communication: Needs assistance Is this a change from baseline?: Change from baseline, expected to last >3 days Dressing (OT): Dependent Is this a change from baseline?: Change from baseline, expected to last >3 days Grooming: Dependent Is this a change from baseline?: Change from baseline, expected to last >3 days Feeding: Dependent Is this a change from baseline?: Change from baseline, expected to last >3 days Bathing: Dependent Is this a change from baseline?: Change from baseline, expected to last >3 days Toileting: Dependent Is this a change from baseline?: Change from baseline, expected to last >3days In/Out Bed: Dependent Is this a change from baseline?: Change from baseline, expected to last >3 days Walks in Home: Dependent Is this a change  from baseline?: Change from baseline, expected to last >3 days Does the patient have difficulty walking or climbing stairs?: Yes Weakness of Legs: Both Weakness of Arms/Hands: Both  Permission Sought/Granted                  Emotional Assessment Appearance:: Appears stated age Attitude/Demeanor/Rapport: Unable to  Assess Affect (typically observed): Unable to Assess Orientation: : Oriented to Self (oriented to self only) Alcohol / Substance Use: Not Applicable Psych Involvement: No (comment)  Admission diagnosis:  Dehydration [E86.0] Alcoholic cirrhosis of liver with ascites (HCC) [K70.31] Hypotension [I95.9] Acute renal failure, unspecified acute renal failure type (HCC) [N17.9] Hypotension due to hypovolemia [I95.89, E86.1] Patient Active Problem List   Diagnosis Date Noted   Failure to thrive in adult 03/29/2022   Pressure injury of skin 03/23/2022   Acute renal failure (HCC)    Shock (HCC)    Hypotension 03/22/2022   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   Melena    Chronic gastric ulcer with hemorrhage    Varices of small intestine    Recurrent major depressive disorder, in full remission (HCC) 11/16/2021   Protein-calorie malnutrition, severe 11/05/2021   Sepsis (HCC) 11/04/2021   Acute lower UTI 11/04/2021   Other ascites    Sepsis due to Spontaneous bacterial peritonitis (HCC) 11/03/2021   Malnutrition of moderate degree 07/14/2021   Other cirrhosis of liver (HCC)    Mucosal abnormality of stomach    Portal hypertensive gastropathy (HCC)    Secondary esophageal varices without bleeding (HCC)    Generalized weakness 07/13/2021   Chronic diarrhea 07/13/2021   Diarrhea due to malabsorption 07/13/2021   Iron deficiency anemia due to chronic blood loss 04/19/2021   Hepatocellular carcinoma (HCC) 06/01/2020   Anxiety 02/25/2020   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HCC) 12/14/2019   Aortic atherosclerosis (HCC) 12/14/2019   Chronic systolic heart failure (HCC) 12/05/2019   Macrocytic anemia 03/05/2018   GERD (gastroesophageal reflux disease) 09/30/2017   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 positive 03/29/2016   Thrombocytopenia (HCC) 12/21/2015   B12 deficiency 08/20/2015   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   Diabetic polyneuropathy associated with type 2  diabetes mellitus (HCC) 08/07/2015   Diplopia 06/05/2015   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 Off narcotics 06/05/17 due to negative UDS for opiates x2. Full history 06/12/14. Pain contract signed.  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HCC) 04/06/2007   Hyperlipidemia associated with type 2 diabetes mellitus (HCC) 04/06/2007   Essential hypertension 04/06/2007   Osteoarthritis 04/06/2007   PCP:  Shelva Majestic, MD Pharmacy:   [facility] [address], [place] - [address] [place] [address] Phone: [phone] Fax: [phone]  [facility] - [place], [place] - [address] [place] [address] Phone: [phone] Fax: [phone]     Social Determinants of Health (SDOH) Interventions    Readmission Risk Interventions    03/28/2022  1:54 PM 07/19/2021    3:02 PM  Readmission Risk Prevention Plan  Transportation Screening Complete Complete  PCP or Specialist Appt within 5-7 Days  Complete  Home Care Screening  Complete  Medication Review (RN Care Manager) Referral to Pharmacy   PCP or Specialist appointment within 3-5 days of discharge Not Complete   PCP/Specialist Appt Not Complete comments will follow up when patient is close to being medically ready for discharge. Family may decide on comfort if no improvement in condition.   SW  Recovery Care/Counseling Consult Complete   Palliative Care Screening Complete   Skilled Nursing Facility Not Applicable

## 2022-04-02 DIAGNOSIS — R1310 Dysphagia, unspecified: Secondary | ICD-10-CM

## 2022-04-02 DIAGNOSIS — K7469 Other cirrhosis of liver: Secondary | ICD-10-CM | POA: Diagnosis not present

## 2022-04-02 DIAGNOSIS — R627 Adult failure to thrive: Secondary | ICD-10-CM | POA: Diagnosis not present

## 2022-04-02 DIAGNOSIS — I959 Hypotension, unspecified: Secondary | ICD-10-CM | POA: Diagnosis not present

## 2022-04-02 DIAGNOSIS — K7682 Hepatic encephalopathy: Secondary | ICD-10-CM

## 2022-04-02 LAB — GLUCOSE, CAPILLARY
Glucose-Capillary: 101 mg/dL — ABNORMAL HIGH (ref 70–99)
Glucose-Capillary: 102 mg/dL — ABNORMAL HIGH (ref 70–99)
Glucose-Capillary: 145 mg/dL — ABNORMAL HIGH (ref 70–99)
Glucose-Capillary: 148 mg/dL — ABNORMAL HIGH (ref 70–99)
Glucose-Capillary: 160 mg/dL — ABNORMAL HIGH (ref 70–99)
Glucose-Capillary: 170 mg/dL — ABNORMAL HIGH (ref 70–99)
Glucose-Capillary: 187 mg/dL — ABNORMAL HIGH (ref 70–99)

## 2022-04-02 LAB — BLOOD GAS, ARTERIAL
Acid-Base Excess: 10.5 mmol/L — ABNORMAL HIGH (ref 0.0–2.0)
Bicarbonate: 34.3 mmol/L — ABNORMAL HIGH (ref 20.0–28.0)
Drawn by: 39899
O2 Saturation: 98.2 %
Patient temperature: 37
pCO2 arterial: 42 mmHg (ref 32–48)
pH, Arterial: 7.52 — ABNORMAL HIGH (ref 7.35–7.45)
pO2, Arterial: 80 mmHg — ABNORMAL LOW (ref 83–108)

## 2022-04-02 LAB — BASIC METABOLIC PANEL
Anion gap: 8 (ref 5–15)
BUN: 65 mg/dL — ABNORMAL HIGH (ref 8–23)
CO2: 27 mmol/L (ref 22–32)
Calcium: 8.7 mg/dL — ABNORMAL LOW (ref 8.9–10.3)
Chloride: 106 mmol/L (ref 98–111)
Creatinine, Ser: 1.22 mg/dL (ref 0.61–1.24)
GFR, Estimated: 60 mL/min — ABNORMAL LOW (ref >60)
Glucose, Bld: 188 mg/dL — ABNORMAL HIGH (ref 70–99)
Potassium: 4.8 mmol/L (ref 3.5–5.1)
Sodium: 141 mmol/L (ref 135–145)

## 2022-04-02 LAB — AMMONIA: Ammonia: 127 umol/L — ABNORMAL HIGH (ref 9–35)

## 2022-04-02 LAB — MAGNESIUM: Magnesium: 1.8 mg/dL (ref 1.7–2.4)

## 2022-04-02 MED ORDER — NALOXONE HCL 0.4 MG/ML IJ SOLN
0.4000 mg | INTRAMUSCULAR | Status: DC | PRN
  Administered 2022-04-02: 0.4 mg via INTRAVENOUS
  Filled 2022-04-02: qty 1

## 2022-04-02 MED ORDER — LACTULOSE 10 GM/15ML PO SOLN
45.0000 g | Freq: Once | ORAL | Status: DC
Start: 2022-04-02 — End: 2022-04-02

## 2022-04-02 NOTE — Progress Notes (Signed)
Pt sleeping more. Responsive to voice, but goes right back to sleep. CBG checked, as he didn't eat lunch or dinner. CBG 101. Girguis MD notified via [facility] page. See new orders.   Rapid called, just to make them aware.

## 2022-04-03 ENCOUNTER — Encounter: Payer: Self-pay | Admitting: Hematology

## 2022-04-03 ENCOUNTER — Inpatient Hospital Stay (HOSPITAL_COMMUNITY): Payer: Medicare HMO

## 2022-04-03 DIAGNOSIS — K7682 Hepatic encephalopathy: Secondary | ICD-10-CM | POA: Diagnosis not present

## 2022-04-03 DIAGNOSIS — R627 Adult failure to thrive: Secondary | ICD-10-CM | POA: Diagnosis not present

## 2022-04-03 DIAGNOSIS — C220 Liver cell carcinoma: Secondary | ICD-10-CM | POA: Diagnosis not present

## 2022-04-03 DIAGNOSIS — R1310 Dysphagia, unspecified: Secondary | ICD-10-CM

## 2022-04-03 LAB — GLUCOSE, CAPILLARY
Glucose-Capillary: 174 mg/dL — ABNORMAL HIGH (ref 70–99)
Glucose-Capillary: 162 mg/dL — ABNORMAL HIGH (ref 70–99)
Glucose-Capillary: 111 mg/dL — ABNORMAL HIGH (ref 70–99)
Glucose-Capillary: 137 mg/dL — ABNORMAL HIGH (ref 70–99)
Glucose-Capillary: 121 mg/dL — ABNORMAL HIGH (ref 70–99)

## 2022-04-03 LAB — BASIC METABOLIC PANEL
Anion gap: 8 (ref 5–15)
BUN: 64 mg/dL — ABNORMAL HIGH (ref 8–23)
CO2: 27 mmol/L (ref 22–32)
Calcium: 9.1 mg/dL (ref 8.9–10.3)
Chloride: 108 mmol/L (ref 98–111)
Creatinine, Ser: 1.26 mg/dL — ABNORMAL HIGH (ref 0.61–1.24)
GFR, Estimated: 57 mL/min — ABNORMAL LOW (ref >60)
Glucose, Bld: 165 mg/dL — ABNORMAL HIGH (ref 70–99)
Potassium: 5.2 mmol/L — ABNORMAL HIGH (ref 3.5–5.1)
Sodium: 143 mmol/L (ref 135–145)

## 2022-04-03 LAB — MAGNESIUM: Magnesium: 2 mg/dL (ref 1.7–2.4)

## 2022-04-03 MED ORDER — LACTULOSE 10 GM/15ML PO SOLN
10.0000 g | Freq: Three times a day (TID) | ORAL | Status: DC
  Administered 2022-04-03 – 2022-04-04 (×4): 10 g
  Filled 2022-04-03 (×4): qty 15

## 2022-04-03 MED ORDER — FENTANYL CITRATE PF 50 MCG/ML IJ SOSY
12.5000 ug | PREFILLED_SYRINGE | Freq: Once | INTRAMUSCULAR | Status: AC
  Administered 2022-04-03: 12.5 ug via INTRAVENOUS
  Filled 2022-04-03: qty 1

## 2022-04-03 MED ORDER — ESCITALOPRAM OXALATE 10 MG PO TABS: 5.0000 mg | ORAL_TABLET | Freq: Every day | ORAL | Status: DC

## 2022-04-03 MED ORDER — ESCITALOPRAM OXALATE 10 MG PO TABS
5.0000 mg | ORAL_TABLET | Freq: Every day | ORAL | Status: DC
  Administered 2022-04-04: 5 mg
  Filled 2022-04-03 (×2): qty 1

## 2022-04-04 DIAGNOSIS — K7031 Alcoholic cirrhosis of liver with ascites: Secondary | ICD-10-CM | POA: Diagnosis not present

## 2022-04-04 DIAGNOSIS — N179 Acute kidney failure, unspecified: Secondary | ICD-10-CM | POA: Diagnosis not present

## 2022-04-04 DIAGNOSIS — C220 Liver cell carcinoma: Secondary | ICD-10-CM | POA: Diagnosis not present

## 2022-04-04 DIAGNOSIS — R627 Adult failure to thrive: Secondary | ICD-10-CM | POA: Diagnosis not present

## 2022-04-04 DIAGNOSIS — R579 Shock, unspecified: Secondary | ICD-10-CM

## 2022-04-04 DIAGNOSIS — Z515 Encounter for palliative care: Secondary | ICD-10-CM

## 2022-04-04 DIAGNOSIS — R1310 Dysphagia, unspecified: Secondary | ICD-10-CM | POA: Diagnosis not present

## 2022-04-04 DIAGNOSIS — Z66 Do not resuscitate: Secondary | ICD-10-CM

## 2022-04-04 DIAGNOSIS — R638 Other symptoms and signs concerning food and fluid intake: Secondary | ICD-10-CM

## 2022-04-04 DIAGNOSIS — Z789 Other specified health status: Secondary | ICD-10-CM

## 2022-04-04 DIAGNOSIS — K7682 Hepatic encephalopathy: Secondary | ICD-10-CM | POA: Diagnosis not present

## 2022-04-04 LAB — CBC WITH DIFFERENTIAL/PLATELET
Abs Immature Granulocytes: 0.03 10*3/uL (ref 0.00–0.07)
Basophils Absolute: 0 10*3/uL (ref 0.0–0.1)
Basophils Relative: 1 %
Eosinophils Absolute: 0.1 10*3/uL (ref 0.0–0.5)
Eosinophils Relative: 1 %
HCT: 27.4 % — ABNORMAL LOW (ref 39.0–52.0)
Hemoglobin: 8.5 g/dL — ABNORMAL LOW (ref 13.0–17.0)
Immature Granulocytes: 1 %
Lymphocytes Relative: 12 %
Lymphs Abs: 0.8 10*3/uL (ref 0.7–4.0)
MCH: 32 pg (ref 26.0–34.0)
MCHC: 31 g/dL (ref 30.0–36.0)
MCV: 103 fL — ABNORMAL HIGH (ref 80.0–100.0)
Monocytes Absolute: 0.6 10*3/uL (ref 0.1–1.0)
Monocytes Relative: 9 %
Neutro Abs: 5 10*3/uL (ref 1.7–7.7)
Neutrophils Relative %: 76 %
Platelets: 80 10*3/uL — ABNORMAL LOW (ref 150–400)
RBC: 2.66 MIL/uL — ABNORMAL LOW (ref 4.22–5.81)
RDW: 27 % — ABNORMAL HIGH (ref 11.5–15.5)
WBC: 6.5 10*3/uL (ref 4.0–10.5)
nRBC: 0.3 % — ABNORMAL HIGH (ref 0.0–0.2)

## 2022-04-04 LAB — COMPREHENSIVE METABOLIC PANEL
ALT: 33 U/L (ref 0–44)
AST: 48 U/L — ABNORMAL HIGH (ref 15–41)
Albumin: 2.5 g/dL — ABNORMAL LOW (ref 3.5–5.0)
Alkaline Phosphatase: 331 U/L — ABNORMAL HIGH (ref 38–126)
Anion gap: 9 (ref 5–15)
BUN: 62 mg/dL — ABNORMAL HIGH (ref 8–23)
CO2: 27 mmol/L (ref 22–32)
Calcium: 9.1 mg/dL (ref 8.9–10.3)
Chloride: 109 mmol/L (ref 98–111)
Creatinine, Ser: 1.28 mg/dL — ABNORMAL HIGH (ref 0.61–1.24)
GFR, Estimated: 56 mL/min — ABNORMAL LOW (ref >60)
Glucose, Bld: 106 mg/dL — ABNORMAL HIGH (ref 70–99)
Potassium: 5 mmol/L (ref 3.5–5.1)
Sodium: 145 mmol/L (ref 135–145)
Total Bilirubin: 5.3 mg/dL — ABNORMAL HIGH (ref 0.3–1.2)
Total Protein: 5.3 g/dL — ABNORMAL LOW (ref 6.5–8.1)

## 2022-04-04 LAB — MAGNESIUM: Magnesium: 2 mg/dL (ref 1.7–2.4)

## 2022-04-04 LAB — GLUCOSE, CAPILLARY
Glucose-Capillary: 101 mg/dL — ABNORMAL HIGH (ref 70–99)
Glucose-Capillary: 110 mg/dL — ABNORMAL HIGH (ref 70–99)
Glucose-Capillary: 110 mg/dL — ABNORMAL HIGH (ref 70–99)
Glucose-Capillary: 114 mg/dL — ABNORMAL HIGH (ref 70–99)
Glucose-Capillary: 99 mg/dL (ref 70–99)

## 2022-04-04 LAB — AMMONIA: Ammonia: 127 umol/L — ABNORMAL HIGH (ref 9–35)

## 2022-04-04 MED ORDER — ONDANSETRON 4 MG PO TBDP: 4.0000 mg | ORAL_TABLET | Freq: Four times a day (QID) | ORAL | Status: DC | PRN

## 2022-04-04 MED ORDER — BIOTENE DRY MOUTH MT LIQD
15.0000 mL | Freq: Two times a day (BID) | OROMUCOSAL | Status: DC
  Administered 2022-04-04 – 2022-04-07 (×6): 15 mL via TOPICAL

## 2022-04-04 MED ORDER — HALOPERIDOL LACTATE 5 MG/ML IJ SOLN: 2.0000 mg | Freq: Four times a day (QID) | INTRAMUSCULAR | Status: DC | PRN

## 2022-04-04 MED ORDER — ONDANSETRON HCL 4 MG/2ML IJ SOLN: 4.0000 mg | Freq: Four times a day (QID) | INTRAMUSCULAR | Status: DC | PRN

## 2022-04-04 MED ORDER — LORAZEPAM 2 MG/ML PO CONC: 1.0000 mg | ORAL | Status: DC | PRN

## 2022-04-04 MED ORDER — HALOPERIDOL LACTATE 2 MG/ML PO CONC: 2.0000 mg | Freq: Four times a day (QID) | ORAL | Status: DC | PRN

## 2022-04-04 MED ORDER — LORAZEPAM 2 MG/ML IJ SOLN: 1.0000 mg | INTRAMUSCULAR | Status: DC | PRN

## 2022-04-04 MED ORDER — LACTULOSE 10 GM/15ML PO SOLN
20.0000 g | Freq: Three times a day (TID) | ORAL | Status: DC
  Administered 2022-04-04: 20 g
  Filled 2022-04-04: qty 30

## 2022-04-04 MED ORDER — ENSURE ENLIVE PO LIQD: 237.0000 mL | ORAL | Status: DC | PRN

## 2022-04-04 MED ORDER — DIPHENHYDRAMINE HCL 50 MG/ML IJ SOLN: 25.0000 mg | INTRAMUSCULAR | Status: DC | PRN

## 2022-04-04 MED ORDER — FUROSEMIDE 10 MG/ML IJ SOLN
40.0000 mg | Freq: Once | INTRAMUSCULAR | Status: AC
  Administered 2022-04-04: 40 mg via INTRAVENOUS
  Filled 2022-04-04: qty 4

## 2022-04-04 MED ORDER — GLYCOPYRROLATE 0.2 MG/ML IJ SOLN
0.2000 mg | INTRAMUSCULAR | Status: DC | PRN
  Administered 2022-04-04: 0.2 mg via INTRAVENOUS
  Filled 2022-04-04 (×2): qty 1

## 2022-04-04 MED ORDER — HYDROMORPHONE HCL 1 MG/ML IJ SOLN
0.5000 mg | INTRAMUSCULAR | Status: DC | PRN
  Administered 2022-04-04 – 2022-04-06 (×4): 1 mg via INTRAVENOUS
  Filled 2022-04-04 (×4): qty 1

## 2022-04-04 MED ORDER — ACETAMINOPHEN 650 MG RE SUPP: 650.0000 mg | Freq: Four times a day (QID) | RECTAL | Status: DC | PRN

## 2022-04-04 MED ORDER — POLYVINYL ALCOHOL 1.4 % OP SOLN: 1.0000 [drp] | Freq: Four times a day (QID) | OPHTHALMIC | Status: DC | PRN

## 2022-04-04 MED ORDER — DIPHENHYDRAMINE HCL 50 MG/ML IJ SOLN: 12.5000 mg | INTRAMUSCULAR | Status: DC | PRN

## 2022-04-04 MED ORDER — GLYCOPYRROLATE 0.2 MG/ML IJ SOLN: 0.2000 mg | INTRAMUSCULAR | Status: DC | PRN

## 2022-04-05 ENCOUNTER — Telehealth: Payer: Self-pay | Admitting: Pharmacist

## 2022-04-05 ENCOUNTER — Telehealth: Payer: Self-pay | Admitting: Family Medicine

## 2022-04-05 DIAGNOSIS — K7682 Hepatic encephalopathy: Secondary | ICD-10-CM | POA: Diagnosis not present

## 2022-04-05 NOTE — Progress Notes (Signed)
Chronic Care Management Pharmacy Assistant   Name: [name]  MRN: [id number] DOB: 1941-09-17   Reason for Encounter: Hypertension Adherence Call    Recent office visits:    Recent consult visits:  03/28/2022 OV (Oncology) Brunetta Genera, MD; no medication changes indicated.  Hospital visits:  03/22/2022 ED to Hospital Admission due to Dehydration  03/18/2022 Admission for Procudure TIPS  Medications: Facility-Administered Encounter Medications as of 04/05/2022  Medication   acetaminophen (TYLENOL) suppository 650 mg   albuterol (PROVENTIL) (2.5 MG/3ML) 0.083% nebulizer solution 2.5 mg   antiseptic oral rinse (BIOTENE) solution 15 mL   diphenhydrAMINE (BENADRYL) injection 25 mg   feeding supplement (ENSURE ENLIVE / ENSURE PLUS) liquid 237 mL   Gerhardt's butt cream   glycopyrrolate (ROBINUL) injection 0.2 mg   Or   glycopyrrolate (ROBINUL) injection 0.2 mg   haloperidol (HALDOL) 2 MG/ML solution 2 mg   Or   haloperidol lactate (HALDOL) injection 2 mg   HYDROmorphone (DILAUDID) injection 0.5-1 mg   LORazepam (ATIVAN) 2 MG/ML concentrated solution 1 mg   Or   LORazepam (ATIVAN) injection 1 mg   ondansetron (ZOFRAN-ODT) disintegrating tablet 4 mg   Or   ondansetron (ZOFRAN) injection 4 mg   polyvinyl alcohol (LIQUIFILM TEARS) 1.4 % ophthalmic solution 1 drop   Outpatient Encounter Medications as of 04/05/2022  Medication Sig Note   albuterol (VENTOLIN HFA) 108 (90 Base) MCG/ACT inhaler Inhale 2 puffs into the lungs every 6 (six) hours as needed for wheezing or shortness of breath.    busPIRone (BUSPAR) 5 MG tablet Take 5 mg by mouth 2 (two) times daily as needed (anxiety). 03/23/2022: Per pt's daughter, they have been giving pt BOTH buspirone 5 mg and buspirone 7.5 mg BID.   busPIRone (BUSPAR) 7.5 MG tablet Take 1 tablet (7.5 mg total) by mouth 2 (two) times daily as needed. for anxiety (Patient taking differently: Take 7.5 mg by mouth 2 (two) times daily as  needed (anxiety).) 03/23/2022: Per pt's daughter, they have been giving pt BOTH buspirone 5 mg and buspirone 7.5 mg BID.   carvedilol (COREG) 6.25 MG tablet Take 1 tablet (6.25 mg total) by mouth 2 (two) times daily with a meal.    ciprofloxacin (CIPRO) 250 MG tablet Take 1 tablet (250 mg total) by mouth daily. (Patient taking differently: Take 250 mg by mouth daily. Continuous course.)    ENTRESTO 24-26 MG Take 1 tablet by mouth 2 (two) times daily.    escitalopram (LEXAPRO) 5 MG tablet Take 1 tablet (5 mg total) by mouth daily. 03/23/2022: Per pt's daughter, he has not taken this medication since 03/17/22, the day before his last procedure.   finasteride (PROSCAR) 5 MG tablet TAKE 1 TABLET EVERY DAY (Patient taking differently: Take 5 mg by mouth daily.) 03/23/2022: Per pt's daughter, he has not taken this medication since 03/17/22, the day before his last procedure.   furosemide (LASIX) 20 MG tablet Take 20 mg by mouth daily. 03/23/2022: Per pt's daughter, he has not taken this medication since 03/17/22, the day before his last procedure.   gabapentin (NEURONTIN) 300 MG capsule TAKE 1 CAPSULE TWICE DAILY (Patient taking differently: Take 300 mg by mouth 2 (two) times daily as needed (pain).) 03/23/2022: LF 08/13/2021 #180, 90 DS. Per pt's daughter, he has not taken this medication since 03/17/22, the day before his last procedure.   lactulose (CHRONULAC) 10 GM/15ML solution Take 30 mLs (20 g total) by mouth 3 (three) times daily.  metFORMIN (GLUCOPHAGE) 500 MG tablet Take 500 mg by mouth 3 (three) times daily. 03/23/2022: Per pt's daughter, he has not taken this medication since 03/17/22, the day before his last procedure.   oxyCODONE (ROXICODONE) 5 MG immediate release tablet Take 2.5 mg by mouth every 6 (six) hours as needed for severe pain. 03/23/2022: LF #15, 5 DS per dispense report. Pt's daughter has been giving this medication to pt for pain.   oxyCODONE-acetaminophen (PERCOCET) 10-325 MG tablet Take 1 tablet  by mouth every 8 (eight) hours as needed for pain (chronic pain in cancer patient with cirrhosis). 03/23/2022: Per pt's daughter, he has not taken this medication since 03/17/22, the day before his last procedure.   pantoprazole (PROTONIX) 40 MG tablet Take 1 tablet (40 mg total) by mouth 2 (two) times daily.    rifaximin (XIFAXAN) 550 MG TABS tablet Take 1 tablet (550 mg total) by mouth 2 (two) times daily. 03/23/2022: Per pt's daughter, he has not taken this medication since 03/17/22, the day before his last procedure.   rosuvastatin (CRESTOR) 10 MG tablet Take 10 mg by mouth daily. 03/23/2022: Per pt's daughter, he has not taken this medication since 03/17/22, the day before his last procedure. LF 01/06/2022 #90, 90 DS via mail order, most recent verified fill of this medication.   rosuvastatin (CRESTOR) 5 MG tablet TAKE 1 TABLET EVERY DAY (Patient not taking: Reported on 03/23/2022) 03/23/2022: Per pt's daughter, he has not taken this medication since 03/17/22, the day before his last procedure.   spironolactone (ALDACTONE) 50 MG tablet Take 50 mg by mouth daily. 03/23/2022: Per pt's daughter, he has not taken this medication since 03/17/22, the day before his last procedure.    sucralfate (CARAFATE) 1 g tablet Take 1 tablet (1 g total) by mouth 4 (four) times daily. (Patient taking differently: Take 1 g by mouth 2 (two) times daily.)    tamsulosin (FLOMAX) 0.4 MG CAPS capsule TAKE 1 CAPSULE EVERY DAY (Patient taking differently: Take 0.4 mg by mouth daily.) 03/23/2022: Per pt's daughter, he has not taken this medication since 03/17/22, the day before his last procedure.   Reviewed chart prior to disease state call. Spoke with patient regarding BP  Recent Office Vitals: BP Readings from Last 3 Encounters:  04/04/22 (!) 117/58  03/19/22 (!) 102/55  03/15/22 98/63   Pulse Readings from Last 3 Encounters:  04/04/22 (!) 108  03/19/22 91  03/10/22 99    Wt Readings from Last 3 Encounters:  04/04/22 187 lb 9.8 oz  (85.1 kg)  03/18/22 174 lb 2.6 oz (79 kg)  03/10/22 174 lb 6.4 oz (79.1 kg)     Kidney Function Lab Results  Component Value Date/Time   CREATININE 1.28 (H) 04/04/2022 04:24 AM   CREATININE 1.26 (H) 04/03/2022 12:33 AM   CREATININE 1.38 (H) 03/10/2022 12:00 PM   CREATININE 1.78 (H) 02/24/2022 10:41 AM   GFR 30.64 (L) 02/09/2022 12:38 PM   GFRNONAA 56 (L) 04/04/2022 04:24 AM   GFRNONAA 51 (L) 03/10/2022 12:00 PM   GFRAA 59 (L) 06/09/2020 08:59 AM       Latest Ref Rng & Units 04/04/2022    4:24 AM 04/03/2022   12:33 AM 04/02/2022    1:56 AM  BMP  Glucose 70 - 99 mg/dL 106  165  188   BUN 8 - 23 mg/dL 62  64  65   Creatinine 0.61 - 1.24 mg/dL 1.28  1.26  1.22   Sodium 135 - 145 mmol/L 145  143  141   Potassium 3.5 - 5.1 mmol/L 5.0  5.2  4.8   Chloride 98 - 111 mmol/L 109  108  106   CO2 22 - 32 mmol/L '27  27  27   '$ Calcium 8.9 - 10.3 mg/dL 9.1  9.1  8.7     Current antihypertensive regimen:    How often are you checking your Blood Pressure?   Current home BP readings:   What recent interventions/DTPs have been made by any provider to improve Blood Pressure control since last CPP Visit:   Any recent hospitalizations or ED visits since last visit with CPP?   What diet changes have been made to improve Blood Pressure Control?    What exercise is being done to improve your Blood Pressure Control?    Adherence Review: Is the patient currently on ACE/ARB medication?  Does the patient have >5 day gap between last estimated fill dates?   **Unsuccessful attempt at reaching patient to complete this call.**  Care Gaps: Medicare Annual Wellness: Completed 09/2021 Ophthalmology Exam: Overdue since 01/25/2019 Foot Exam: Next due on 05/14/2022 Hemoglobin A1C: 5.4% on 11/03/2021 Colonoscopy: Next due on 07/14/2022  Future Appointments  Date Time Provider [place]  05/03/2022  1:15 PM Marzetta Board, DPM [facility]  06/02/2022  4:00 PM Satira Sark,  MD [facility]  06/29/2022  1:00 PM [facility] None  06/29/2022  1:30 PM Brunetta Genera, MD [facility] None  07/25/2022  2:00 PM [facility] CCM PHARMACIST [facility]  09/23/2022  2:30 PM [facility] HEALTH COACH [facility]   Star Rating Drugs: Rosuvastatin 5 mg last filled 02/10/2022 90 DS Entresto '24mg'$ -26 mg last filled 01/26/2022 90 DS Metformin 500 mg last filled 01/13/2022 90 DS  April D Calhoun, [place] Pharmacist Assistant [phone]

## 2022-04-06 ENCOUNTER — Ambulatory Visit: Payer: Medicare HMO | Admitting: Physician Assistant

## 2022-04-06 DIAGNOSIS — E861 Hypovolemia: Secondary | ICD-10-CM

## 2022-04-06 DIAGNOSIS — R1310 Dysphagia, unspecified: Secondary | ICD-10-CM | POA: Diagnosis not present

## 2022-04-06 DIAGNOSIS — K7031 Alcoholic cirrhosis of liver with ascites: Secondary | ICD-10-CM | POA: Diagnosis not present

## 2022-04-06 DIAGNOSIS — K766 Portal hypertension: Secondary | ICD-10-CM

## 2022-04-06 DIAGNOSIS — I9589 Other hypotension: Secondary | ICD-10-CM

## 2022-04-06 DIAGNOSIS — R0989 Other specified symptoms and signs involving the circulatory and respiratory systems: Secondary | ICD-10-CM

## 2022-04-06 DIAGNOSIS — K3189 Other diseases of stomach and duodenum: Secondary | ICD-10-CM

## 2022-04-06 DIAGNOSIS — N179 Acute kidney failure, unspecified: Secondary | ICD-10-CM | POA: Diagnosis not present

## 2022-04-06 DIAGNOSIS — R52 Pain, unspecified: Secondary | ICD-10-CM

## 2022-04-06 DIAGNOSIS — K7682 Hepatic encephalopathy: Secondary | ICD-10-CM | POA: Diagnosis not present

## 2022-04-06 LAB — SARS CORONAVIRUS 2 BY RT PCR: SARS Coronavirus 2 by RT PCR: NEGATIVE

## 2022-04-06 MED ORDER — MORPHINE SULFATE (CONCENTRATE) 10 MG/0.5ML PO SOLN
5.0000 mg | ORAL | Status: DC | PRN
  Administered 2022-04-06: 5 mg via ORAL
  Filled 2022-04-06: qty 0.5

## 2022-04-06 MED ORDER — HYDROMORPHONE HCL 1 MG/ML IJ SOLN
0.5000 mg | INTRAMUSCULAR | Status: DC | PRN
  Filled 2022-04-06: qty 1

## 2022-04-06 MED ORDER — MORPHINE SULFATE (CONCENTRATE) 10 MG/0.5ML PO SOLN
20.0000 mg | ORAL | Status: DC | PRN
  Administered 2022-04-06 – 2022-04-07 (×2): 20 mg via ORAL
  Filled 2022-04-06 (×2): qty 1

## 2022-04-06 NOTE — Progress Notes (Signed)
PROGRESS NOTE    [name]  GYI:[id number] DOB: [birth year] DOA: 03/22/2022 PCP: [name], MD   Brief Narrative:  81 year old man who presented to [facility] 6/13 for weakness, decreased mobility and poor PO intake s/p fall 3 days PTA. PMHx significant for HTN, HLD, mild AS, HFrEF (EF as low as 20-25%, recovered with Echo 02/2022 EF 55-60%, G1DD), COPD, prior tobacco use, T2DM, NASH cirrhosis with portal hypertension as evidenced by EV, PHG, ascites requiring frequent paracentesis (c/b SBP) and HCC (s/p radiation), gastric/duodenal ulcers and diverticulosis, IDA. Recently underwent TIPS placement with IR 6/9 with gradient reduction from 13 to 2. Monitored x 24H and discharged home 6/10.   Patient reportedly had a fall 6/10 shortly after arriving home, sustaining a L arm injury. Since injury, patient's mobility has been decreased and appetite has been poor; Additionally reporting decreased UOP and constipation. Patient was advised by IR to present to ED for further evaluation. Presented to [facility] ED 6/13 for weakness, decreased mobility and poor PO intake s/p fall. Vitals on ED arrival were notable for BP 89/35. Labs notable for new transaminitis with AST 545/ALT 293, Cr 4.78 (baseline 1.3-1.8), INR 1.5, WBC/LA WNL. Hgb 8.7, near baseline. CXR with small L pleural effusion. CT C-spine and L arm/shoulder imaging negative. Fluid resuscitation was initiated (3L) with slight improvement in SBPs to 80s. RIJ CVC placed and Levophed was initiated for additional BP support. PCCM consulted for ICU admission/transfer to [facility].    Transferred to [facility] for continued care.  Family wants no escalation of care.  Continue medical support in hopes of improvement.  Given further decline family has opted to transition to comfort measures and follow-up with palliative care and hospice.  Disposition pending family and palliative care discussion likely to hospice house.  Assessment & Plan:   Principal Problem:   Acute  hepatic encephalopathy (HCC) Active Problems:   Hepatocellular carcinoma (HCC)   Other cirrhosis of liver (HCC)   Portal hypertensive gastropathy (HCC)   Hypotension   Acute renal failure (HCC)   Shock ([place])   Pressure injury of skin   Failure to thrive in adult   Dysphagia   Goals of care Patient transitioning to comfort measures per previous discussion Continue to advance analgesics and anxiolytics as appropriate Defer to palliative care and hospice for further intervention or transition to hospice house versus home with hospice per family wishes  Hepatic encephalopathy:  - Continue comfort measure orders, DC all previous medications   SOB -Continue anxiolytics and pain control as per comfort measure orders   Dysphagia Can transition to comfort feeds   Liver cirrhosis with portal hypertension ascites status post TIPS Poor prognosis   AKI Shock: - resolved  Hepatocellular carcinoma status post SBRT: No acute treatment. HFrEF:  COPD:  Thrombocytopenia, elevated INR:  Type 2 diabetes mellitus:  Gastric and duodenal ulcers:  Depression/anxiety:   CODE STATUS: DNR/comfort measures  Status is: Inpatient  Dispo: The patient is from: Home              Anticipated d/c is to: To be determined              Anticipated d/c date is: Imminent              Patient currently is medically stable for discharge  Consultants:  Palliative care  Procedures:  None  Antimicrobials:  None  Subjective: No acute issues or events overnight  Objective: Vitals:   04/04/22 0718 04/04/22 1152 04/04/22 1535 04/04/22  2000  BP: 116/64 (!) 114/57 (!) 117/58   Pulse: (!) 112 (!) 111 (!) 108   Resp: (!) 26 (!) 25 20   Temp: 98.2 F (36.8 C) 98.1 F (36.7 C) 98.2 F (36.8 C)   TempSrc: Oral Oral Oral   SpO2: 97% 94% 94% 94%  Weight: 85.1 kg     Height:        Intake/Output Summary (Last 24 hours) at 04/06/2022 0749 Last data filed at 04/06/2022 0600 Gross per 24 hour  Intake  0 ml  Output 900 ml  Net -900 ml    Filed Weights   04/03/22 0425 04/04/22 0500 04/04/22 0718  Weight: 84.4 kg 82.7 kg 85.1 kg    Examination:  General:  Pleasantly resting in bed, No acute distress. HEENT:  Normocephalic atraumatic.   Neck:  Without mass or deformity.  Trachea is midline. Extremities: Without cyanosis, clubbing, edema, or obvious deformity.  Data Reviewed: I have personally reviewed following labs and imaging studies  CBC: Recent Labs  Lab 03/31/22 0050 04/04/22 0424  WBC 6.3 6.5  NEUTROABS 4.8 5.0  HGB 8.4* 8.5*  HCT 27.4* 27.4*  MCV 103.0* 103.0*  PLT 82* 80*    Basic Metabolic Panel: Recent Labs  Lab 03/31/22 0050 04/01/22 0113 04/02/22 0156 04/03/22 0033 04/04/22 0424  NA 150* 144 141 143 145  K 4.5 4.4 4.8 5.2* 5.0  CL 113* 108 106 108 109  CO2 '29 29 27 27 27  '$ GLUCOSE 171* 148* 188* 165* 106*  BUN 59* 59* 65* 64* 62*  CREATININE 1.34* 1.20 1.22 1.26* 1.28*  CALCIUM 9.1 8.6* 8.7* 9.1 9.1  MG 1.9 1.9 1.8 2.0 2.0    GFR: Estimated Creatinine Clearance: 46.7 mL/min (A) (by C-G formula based on SCr of 1.28 mg/dL (H)). Liver Function Tests: Recent Labs  Lab 03/31/22 0050 04/04/22 0424  AST 74* 48*  ALT 45* 33  ALKPHOS 332* 331*  BILITOT 2.4* 5.3*  PROT 5.0* 5.3*  ALBUMIN 2.6* 2.5*    No results for input(s): "LIPASE", "AMYLASE" in the last 168 hours. Recent Labs  Lab 04/02/22 1824 04/04/22 0842  AMMONIA 127* 127*    Coagulation Profile: No results for input(s): "INR", "PROTIME" in the last 168 hours.  Cardiac Enzymes: No results for input(s): "CKTOTAL", "CKMB", "CKMBINDEX", "TROPONINI" in the last 168 hours. BNP (last 3 results) No results for input(s): "PROBNP" in the last 8760 hours. HbA1C: No results for input(s): "HGBA1C" in the last 72 hours. CBG: Recent Labs  Lab 04/04/22 0021 04/04/22 0453 04/04/22 0720 04/04/22 1147 04/04/22 1537  GLUCAP 110* 99 101* 114* 110*    Lipid Profile: No results for  input(s): "CHOL", "HDL", "LDLCALC", "TRIG", "CHOLHDL", "LDLDIRECT" in the last 72 hours. Thyroid Function Tests: No results for input(s): "TSH", "T4TOTAL", "FREET4", "T3FREE", "THYROIDAB" in the last 72 hours. Anemia Panel: No results for input(s): "VITAMINB12", "FOLATE", "FERRITIN", "TIBC", "IRON", "RETICCTPCT" in the last 72 hours. Sepsis Labs: No results for input(s): "PROCALCITON", "LATICACIDVEN" in the last 168 hours.  No results found for this or any previous visit (from the past 240 hour(s)).   Radiology Studies: No results found.  Scheduled Meds:  antiseptic oral rinse  15 mL Topical BID   Gerhardt's butt cream   Topical BID    LOS: 15 days   Time spent: 71mn  Joshua Garabedian C Cline Draheim, DO [facility]  If 7PM-7AM, please contact night-coverage [email or web address]  04/06/2022, 7:49 AM

## 2022-04-06 NOTE — Progress Notes (Signed)
Daily Progress Note   Patient Name: [name]       Date: 04/06/2022 DOB: Sep 06, 1941  Age: 81 y.o. MRN#: [id number] Attending Physician: Little Ishikawa, MD Primary Care Physician: [name], MD Admit Date: 03/22/2022  Reason for Consultation/Follow-up: Non pain symptom management, Pain control, Psychosocial/spiritual support, and Terminal Care  Subjective: Chart review performed.  Received report from primary RN -no acute concerns.  Per RN, patient lost IV access last night and had to transition IV medications to oral.  RN reports patient is not eating or drinking and is also no longer having loose stool.  We will remove rectal tube.  Went to visit patient at bedside -brother/[name] present.  Patient was lying in bed asleep -he does briefly wake to voice/gentle touch.  Patient's speech is mumbled and almost unintelligible -I am able to understand that he is having pain.  No respiratory distress, increased work of breathing, or secretions noted. [name] reports that patient has had a congested cough and asks if he can be suctioned -education provided that suctioning can help and also medications can be provided to dry up respiratory secretions. [name] also states that patient has seemed more uncomfortable today compared to yesterday.  Education provided this might be due to change in medications -patient was receiving 1 mg IV Dilaudid yesterday compared to 5 mg oral morphine today -we will adjust to ensure equivalency.  Emotional support provided to patient's family.  Therapeutic listening provided as Letitia Libra reflects on happy memories with the patient.  Natural trajectory at end-of-life reviewed per his request.  Prognostication reviewed.   All questions and concerns  addressed.  Called patient's daughter/Tina.  Emotional support provided.  Prognostication reviewed.  Natural disease trajectory at end-of-life reviewed. Discussed option of patient transfer to residential hospice facility -[name] is agreeable and requests residential hospice in [place].   All questions and concerns addressed. Encouraged to call with questions and/or concerns. PMT card previously provided.  Requested RN provide updated dose of morphine concentrate along with Robinul as well as informed goal is to remove rectal tube today.  Length of Stay: 15  Current Medications: Scheduled Meds:   antiseptic oral rinse  15 mL Topical BID   Gerhardt's butt cream   Topical BID    Continuous Infusions:   PRN Meds: acetaminophen, albuterol, diphenhydrAMINE, feeding  supplement, glycopyrrolate **OR** glycopyrrolate, haloperidol **OR** haloperidol lactate, LORazepam **OR** [DISCONTINUED] LORazepam, morphine CONCENTRATE, ondansetron **OR** [DISCONTINUED] ondansetron (ZOFRAN) IV, polyvinyl alcohol  Physical Exam Vitals and nursing note reviewed.  Constitutional:      General: He is not in acute distress.    Appearance: He is ill-appearing.  Pulmonary:     Effort: No respiratory distress.  Skin:    General: Skin is warm and dry.     Coloration: Skin is jaundiced.  Neurological:     Mental Status: He is lethargic.     Motor: Weakness present.  Psychiatric:        Speech: Speech is delayed and slurred.        Cognition and Memory: Memory normal.             Vital Signs: BP (!) 117/58 (BP Location: Right Arm)   Pulse (!) 108   Temp 98.2 F (36.8 C) (Oral)   Resp 20   Ht '5\' 10"'$  (1.778 m)   Wt 85.1 kg   SpO2 94%   BMI 26.92 kg/m  SpO2: SpO2: 94 % O2 Device: O2 Device: Room Air O2 Flow Rate: O2 Flow Rate (L/min): 2 L/min  Intake/output summary:  Intake/Output Summary (Last 24 hours) at 04/06/2022 0944 Last data filed at 04/06/2022 0600 Gross per 24 hour  Intake 0 ml  Output  900 ml  Net -900 ml   LBM: Last BM Date : 04/04/26 Baseline Weight: Weight: 79 kg Most recent weight: Weight: 85.1 kg       Palliative Assessment/Data: PPS 10%      Patient Active Problem List   Diagnosis Date Noted   Acute hepatic encephalopathy ([place]) 04/02/2022   Dysphagia 04/02/2022   Failure to thrive in adult 03/29/2022   Pressure injury of skin 03/23/2022   Acute renal failure (HCC)    Shock (HCC)    Hypotension 03/22/2022   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   Melena    Chronic gastric ulcer with hemorrhage    Varices of small intestine    Recurrent major depressive disorder, in full remission ([place]) 11/16/2021   Protein-calorie malnutrition, severe 11/05/2021   Sepsis ([place]) 11/04/2021   Acute lower UTI 11/04/2021   Other ascites    Sepsis due to Spontaneous bacterial peritonitis ([place]) 11/03/2021   Malnutrition of moderate degree 07/14/2021   Other cirrhosis of liver ([place])    Mucosal abnormality of stomach    Portal hypertensive gastropathy (HCC)    Secondary esophageal varices without bleeding (HCC)    Generalized weakness 07/13/2021   Chronic diarrhea 07/13/2021   Diarrhea due to malabsorption 07/13/2021   Iron deficiency anemia due to chronic blood loss 04/19/2021   Hepatocellular carcinoma ([place]) 06/01/2020   Anxiety 31/54/0086   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[place]) 12/14/2019   Aortic atherosclerosis (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 Off narcotics 06/05/17 due to negative UDS for opiates x2. Full history 06/12/14. Pain contract signed.  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[place]) 04/06/2007   Hyperlipidemia associated with type 2 diabetes mellitus ([place]) 04/06/2007   Essential hypertension 04/06/2007   Osteoarthritis 04/06/2007    Palliative Care Assessment & Plan   Patient Profile: 81 y.o. male  with past medical history of  HTN, HLD, AS, HFrEF (EF as low as 20-25%, recovered with Echo 02/2022 EF 55-60%, G1DD), COPD, DM2,HCC (s/p radiation), gastric/duodenal ulcers and diverticulosis, IDA, Cirrhosis w/w NASH, portal hypertension admitted on 03/22/2022 with weakness, decreased mobility, poor p.o. intake status post fall.    Patient was discharged home on 6/10 after TIPS placement.  Has decline rapidly since return home.  Was initially on pressors.  PMT has been consulted to assist with goals of care conversation.    Assessment: Principal Problem:   Acute hepatic encephalopathy (HCC) Active Problems:   Hepatocellular carcinoma (HCC)   Other cirrhosis of liver (HCC)   Portal hypertensive gastropathy (HCC)   Hypotension   Acute renal failure (HCC)   Shock ([place])   Pressure injury of skin   Failure to thrive in adult   Dysphagia Terminal care  Recommendations/Plan: Continue full comfort measures Continue DNR/DNI as previously documented Family agreeable for patient transfer to residential hospice - requesting [facility];  TOC notified and consult placed COVID test ordered - will need negative result prior to discharge to facility per [facility] Due to loss of IV access, agree with oral morphine. Adjusted dose to be equivalent to '1mg'$  IV dialudid, which was previously managing pain well Remove rectal tube, continue foley catheter Continue other comfort focused medication regimen PMT will continue to follow and support holistically  Symptom Management Morphine PO PRN pain/dyspnea/increased work of breathing/RR>25/distress Tylenol PRN pain/fever Biotin twice daily Benadryl PRN itching Robinul PRN secretions Haldol PRN agitation/delirium Ativan PRN anxiety/seizure/sleep/distress Zofran PRN nausea/vomiting Liquifilm Tears PRN dry eye   Goals of Care and Additional Recommendations: Limitations on Scope of Treatment: Full Comfort Care  Code Status:    Code Status Orders  (From admission, onward)           Start     Ordered   04/04/22 1554  Do not attempt resuscitation (DNR)  Continuous       Question Answer Comment  In the event of cardiac or respiratory ARREST Do not call a "code blue"   In the event of cardiac or respiratory ARREST Do not perform Intubation, CPR, defibrillation or ACLS   In the event of cardiac or respiratory ARREST Use medication by any route, position, wound care, and other measures to relive pain and suffering. May use oxygen, suction and manual treatment of airway obstruction as needed for comfort.      04/04/22 1603           Code Status History     Date Active Date Inactive Code Status Order ID Comments User Context   03/24/2022 1038 04/04/2022 1603 DNR [id number]  Corey Harold, NP Inpatient   03/22/2022 2355 03/24/2022 1038 Full Code [id number]  Lestine Mount, PA-C Inpatient   03/22/2022 2224 03/22/2022 2242 DNR [id number]  Wyvonnia Dusky, MD ED   11/06/2021 [id number] 11/07/2021 1957  DNR [id number]  Rosezella Rumpf, NP Inpatient   11/03/2021 1420 11/06/2021 0746 Full Code  [id number]  Norval [name], MD ED   07/13/2021 0310 07/19/2021 2038 Full Code [id number]  Kristopher Oppenheim, DO ED   07/13/2021 0041 07/13/2021 0310 Full Code [id number]  Kristopher Oppenheim, DO ED   12/04/2019 0210 12/08/2019 1913 Full Code [id number]  Bonnell Public, MD Inpatient       Prognosis:  < 2 weeks  Discharge Planning: Hospice facility  Care plan was discussed with primary RN, patient's family, TOC, Dr. Avon Gully  Thank you for allowing the Palliative Medicine Team to assist in the care of this patient.  Lin Landsman, NP  Please contact Palliative Medicine Team phone at [phone] for questions and concerns.    *Portions of this note are a verbal dictation therefore any spelling and/or grammatical errors are due to the "[employer] [employer]" system interpretation.

## 2022-04-06 NOTE — Progress Notes (Signed)
CSW was notified by Luetta Nutting, Palliative NP that patient's family is agreeable for inpatient placement.  CSW spoke with Cassandra at [facility] to make referral for inpatient hospice. Cassandra states the patient will be reviewed by MD for possible approval. Cassandra states a negative COVID test must be completed prior to acceptance into the facility.  Madilyn Fireman, MSW, LCSW Transitions of Care  Clinical Social Worker II [phone]

## 2022-04-06 NOTE — Progress Notes (Signed)
Nutrition Brief Note  Chart reviewed. Pt has transitioned to comfort care.  No further nutrition interventions planned at this time.  Please re-consult as needed.    Kate Tarry Blayney, MS, RD, LDN Inpatient Clinical Dietitian Please see AMiON for contact information.  

## 2022-04-07 DIAGNOSIS — K7682 Hepatic encephalopathy: Secondary | ICD-10-CM | POA: Diagnosis not present

## 2022-04-07 MED ORDER — LIP MEDEX EX OINT
TOPICAL_OINTMENT | CUTANEOUS | Status: DC | PRN
  Administered 2022-04-07: 75 via TOPICAL
  Filled 2022-04-07: qty 7

## 2022-04-07 NOTE — Discharge Summary (Signed)
Physician Discharge Summary  [name] OVZ:[id number] DOB: 03-27-1941 DOA: 03/22/2022  PCP: [name], MD  Admit date: 03/22/2022 Discharge date: 04/07/2022  Admitted From: Home Disposition:  Hospice  Discharge Condition:Poor  CODE STATUS:DNR - comfort measures  Diet recommendation:  As tolerated  Brief/Interim Summary: 81 year old man who presented to [facility] 6/13 for weakness, decreased mobility and poor PO intake s/p fall 3 days PTA. PMHx significant for HTN, HLD, mild AS, HFrEF (EF as low as 20-25%, recovered with Echo 02/2022 EF 55-60%, G1DD), COPD, prior tobacco use, T2DM, NASH cirrhosis with portal hypertension as evidenced by EV, PHG, ascites requiring frequent paracentesis (c/b SBP) and HCC (s/p radiation), gastric/duodenal ulcers and diverticulosis, IDA. Recently underwent TIPS placement with IR 6/9 with gradient reduction from 13 to 2. Monitored x 24H and discharged home 6/10.  Presents back to the hospital after fall at home with worsening appetite mental status poor urine output dehydration and failure to thrive.  Ultimate decision made to transition to comfort measures and hospice, discharge to hospice house 04/07/2022.  Discharge Diagnoses:  Principal Problem:   Acute hepatic encephalopathy (HCC) Active Problems:   Hepatocellular carcinoma (HCC)   Other cirrhosis of liver (HCC)   Portal hypertensive gastropathy (HCC)   Hypotension   Acute renal failure (HCC)   Shock ([place])   Pressure injury of skin   Failure to thrive in adult   Dysphagia    Discharge Instructions  Discharge Instructions     Discharge patient   Complete by: As directed    Discharge disposition: 51-Hospice/Medical Facility   Discharge patient date: 04/07/2022      Allergies as of 04/07/2022   No Known Allergies      Medication List     STOP taking these medications    albuterol 108 (90 Base) MCG/ACT inhaler Commonly known as: VENTOLIN HFA   busPIRone 5 MG tablet Commonly known  as: BUSPAR   busPIRone 7.5 MG tablet Commonly known as: BUSPAR   carvedilol 6.25 MG tablet Commonly known as: COREG   ciprofloxacin 250 MG tablet Commonly known as: Cipro   Entresto 24-26 MG Generic drug: sacubitril-valsartan   escitalopram 5 MG tablet Commonly known as: LEXAPRO   finasteride 5 MG tablet Commonly known as: PROSCAR   furosemide 20 MG tablet Commonly known as: LASIX   gabapentin 300 MG capsule Commonly known as: NEURONTIN   lactulose 10 GM/15ML solution Commonly known as: CHRONULAC   metFORMIN 500 MG tablet Commonly known as: GLUCOPHAGE   oxyCODONE-acetaminophen 10-325 MG tablet Commonly known as: PERCOCET   pantoprazole 40 MG tablet Commonly known as: PROTONIX   rifaximin 550 MG Tabs tablet Commonly known as: XIFAXAN   rosuvastatin 10 MG tablet Commonly known as: CRESTOR   rosuvastatin 5 MG tablet Commonly known as: CRESTOR   Roxicodone 5 MG immediate release tablet Generic drug: oxyCODONE   spironolactone 50 MG tablet Commonly known as: ALDACTONE   sucralfate 1 g tablet Commonly known as: Carafate   tamsulosin 0.4 MG Caps capsule Commonly known as: FLOMAX        No Known Allergies  Consultations: Palliative care   Procedures/Studies: DG CHEST PORT 1 VIEW  Result Date: 04/03/2022 CLINICAL DATA:  Respiratory distress EXAM: PORTABLE CHEST 1 VIEW COMPARISON:  03/22/2012 FINDINGS: Increased opacity in the right lung. Mild cardiomegaly. Left basilar atelectasis. Tubing follows the expected course of the esophagus beyond the field of view. IMPRESSION: Increased opacity in the right lung may indicate infection. Electronically Signed   By: Lennette Bihari  [place] M.D.   On: 04/03/2022 21:29   DG Abd Portable 1V  Result Date: 03/25/2022 CLINICAL DATA:  Provided history: Feeding tube insertion. EXAM: PORTABLE ABDOMEN - 1 VIEW COMPARISON:  Abdominal radiograph 03/24/2022. FINDINGS: Problem-oriented examination for enteric tube position. An  enteric tube passes below the level left hemidiaphragm with tip projecting in the expected location of the gastric body. Redemonstrated small amount of enteric contrast within the left upper quadrant of the abdomen. Mild air distension of small and large bowel within the imaged abdomen. Prior TIPS. Degenerative changes of the spine. IMPRESSION: Enteric tube present with tip projecting in expected location of the gastric body. Electronically Signed   By: Kellie Simmering D.O.   On: 03/25/2022 10:54   US LIVER DOPPLER  Result Date: 03/25/2022 CLINICAL DATA:  Status post TIPS on 03/18/2022. Current admission for encephalopathy, possible sepsis and acute kidney injury. Assessment of TIPS and portal vein patency. EXAM: DUPLEX ULTRASOUND OF LIVER AND TIPS SHUNT TECHNIQUE: Color and duplex Doppler ultrasound was performed to evaluate the hepatic in-flow and out-flow vessels. COMPARISON:  None Available. FINDINGS: Portal Vein Velocities Main:  52 cm/sec Right:  84 cm/sec Left:  18 cm/sec TIPS Stent Velocities Proximal:  110 cm/sec Mid:  190 Distal:  103 cm/sec IVC: Present and patent with normal respiratory phasicity. Hepatic Vein Velocities Right:  50 cm/sec Mid:  48 cm/sec Left:  32 cm/sec Splenic Vein: 36 Superior Mesenteric Vein: None visualized. Hepatic Artery: 142 Ascities: Moderate ascites present. Varices: None visualized. Portal vein waveforms are normal and flow is towards the liver. The TIPS shunt is normally patent. Hepatic vein waveforms are normal. Normal spleen size. IMPRESSION: Normal patency of TIPS shunt and portal vein. Electronically Signed   By: Aletta Edouard M.D.   On: 03/25/2022 08:14   US RENAL  Result Date: 03/24/2022 CLINICAL DATA:  Status post tips procedure. EXAM: RENAL / URINARY TRACT ULTRASOUND COMPLETE COMPARISON:  MRI 03/16/2022 FINDINGS: Right Kidney: Renal measurements: 10.3 x 5.2 x 5.1 cm = volume: 144.4 mL. Mild age related renal cortical thinning but normal echogenicity and no  focal lesions or hydronephrosis. Left Kidney: Renal measurements: 9.3 x 5.5 x 5.1 cm = volume: 135.6 mL. Mild age related renal cortical thinning but normal echogenicity. No focal lesions or hydronephrosis. Bladder: Appears normal for degree of bladder distention. Other: Moderate ascites. IMPRESSION: No renal lesions or hydronephrosis. Unremarkable sonographic appearance of the bladder. Electronically Signed   By: Marijo Sanes M.D.   On: 03/24/2022 19:47   DG Abd 1 View  Result Date: 03/24/2022 CLINICAL DATA:  Encounter for tube placement EXAM: ABDOMEN - 1 VIEW COMPARISON:  02/04/2022 FINDINGS: Limited radiograph of the lower chest and upper abdomen was obtained for the purposes of enteric tube localization. Enteric tube is seen coursing below the diaphragm with distal tip and side port terminating within the expected location of the gastric body. IMPRESSION: Enteric tube within the gastric body. Electronically Signed   By: Davina Poke D.O.   On: 03/24/2022 13:48   ECHOCARDIOGRAM LIMITED  Result Date: 03/23/2022    ECHOCARDIOGRAM LIMITED REPORT   Patient Name:   [name] Date of Exam: 03/23/2022 Medical Rec #:  [id number]      Height:       70.0 in Accession #:    [id number]     Weight:       186.1 lb Date of Birth:  1940/10/28      BSA:  2.024 m Patient Age:    24 years       BP:           107/49 mmHg Patient Gender: M              HR:           74 bpm. Exam Location:  Inpatient Procedure: 2D Echo, Cardiac Doppler, Color Doppler and Limited Echo Indications:    Other cardiac sounds  History:        Patient has prior history of Echocardiogram examinations. COPD;                 Risk Factors:Hypertension and Diabetes.  Sonographer:    Jyl Heinz Referring Phys: [id number] [place]  1. Left ventricular ejection fraction, by estimation, is 65 to 70%. The left ventricle has normal function. The left ventricle has no regional wall motion abnormalities. Left ventricular diastolic  parameters were normal.  2. Right ventricular systolic function is normal. The right ventricular size is normal.  3. The mitral valve is normal in structure. Mild mitral valve regurgitation.  4. Difficult acoustic windows. AV is not well seen. Peak and mean gradients through the valve are 31 and 18 mm HG respectivley. 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 Compared to echo report from May 2023, mean gradient is increased (13 to 18 mm Hg), and dimensionless index is lower (0.43 to 0.34).  5. Aortic dilatation noted. There is mild dilatation of the aortic root, measuring 39 mm. FINDINGS  Left Ventricle: Left ventricular ejection fraction, by estimation, is 65 to 70%. The left ventricle has normal function. The left ventricle has no regional wall motion abnormalities. The left ventricular internal cavity size was normal in size. There is  no left ventricular hypertrophy. Left ventricular diastolic parameters were normal. Right Ventricle: The right ventricular size is normal. Right vetricular wall thickness was not assessed. Right ventricular systolic function is normal. Pericardium: There is no evidence of pericardial effusion. Mitral Valve: The mitral valve is normal in structure. Mild mitral valve regurgitation. Tricuspid Valve: The tricuspid valve is grossly normal. Tricuspid valve regurgitation is trivial. Aortic Valve: Difficult acoustic windows. AV is not well seen. Peak and mean gradients through the valve are 31 and 18 mm HG respectivley. 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 Compared to echo report from May 2023, mean gradient is increased (13 to 18 mm Hg), and dimensionless index is lower (0.43 to 0.34). Aortic valve mean gradient measures 17.3 mmHg. Aortic valve peak gradient measures 29.8 mmHg. Aortic valve area, by VTI measures 1.40 cm. Pulmonic Valve: The pulmonic valve was normal in structure. Pulmonic valve regurgitation  is not visualized. Aorta: Aortic dilatation noted. There is mild dilatation of the aortic root, measuring 39 mm. LEFT VENTRICLE PLAX 2D LVIDd:         4.40 cm      Diastology LVIDs:         2.80 cm      LV e' medial:    9.48 cm/s LV PW:         1.10 cm      LV E/e' medial:  11.5 LV IVS:        1.00 cm      LV e' lateral:   8.55 cm/s LVOT diam:     2.28 cm      LV E/e' lateral: 12.7 LV SV:  83 LV SV Index:   41 LVOT Area:     4.09 cm  LV Volumes (MOD) LV vol d, MOD A2C: 100.0 ml LV vol d, MOD A4C: 128.0 ml LV vol s, MOD A2C: 32.6 ml LV vol s, MOD A4C: 45.9 ml LV SV MOD A2C:     67.4 ml LV SV MOD A4C:     128.0 ml LV SV MOD BP:      76.0 ml RIGHT VENTRICLE             IVC RV S prime:     13.50 cm/s  IVC diam: 2.30 cm TAPSE (M-mode): 2.3 cm LEFT ATRIUM         Index LA diam:    3.30 cm 1.63 cm/m  AORTIC VALVE AV Area (Vmax):    1.38 cm AV Area (Vmean):   1.35 cm AV Area (VTI):     1.40 cm AV Vmax:           273.00 cm/s AV Vmean:          202.000 cm/s AV VTI:            0.594 m AV Peak Grad:      29.8 mmHg AV Mean Grad:      17.3 mmHg LVOT Vmax:         92.10 cm/s LVOT Vmean:        66.750 cm/s LVOT VTI:          0.204 m LVOT/AV VTI ratio: 0.34  AORTA Ao Root diam: 3.90 cm Ao Asc diam:  3.40 cm MITRAL VALVE                TRICUSPID VALVE MV Area (PHT): 3.56 cm     TR Peak grad:   29.8 mmHg MV Decel Time: 213 msec     TR Vmax:        273.00 cm/s MV E velocity: 109.00 cm/s MV A velocity: 95.90 cm/s   SHUNTS MV E/A ratio:  1.14         Systemic VTI:  0.20 m                             Systemic Diam: 2.28 cm Dorris Carnes MD Electronically signed by Dorris Carnes MD Signature Date/Time: 03/23/2022/6:21:00 PM    Final    US Abdomen Complete  Result Date: 03/23/2022 CLINICAL DATA:  Acute encephalopathy. EXAM: ABDOMEN ULTRASOUND COMPLETE COMPARISON:  CT of the abdomen pelvis dated 02/04/2022. FINDINGS: Evaluation is very limited due to portable technique. Gallbladder: Not visualized. Common bile duct: Diameter: 13  mm. There is dilatation of the common bile duct. Liver: Fatty liver with changes of cirrhosis. Portal vein is patent on color Doppler imaging with normal direction of blood flow towards the liver. IVC: Suboptimally visualized. Pancreas: Not well seen. Spleen: Size and appearance within normal limits. Right Kidney: Length: 10.7 cm. Mild increased echogenicity. No hydronephrosis or shadowing stone. Left Kidney: Length: 10.2 cm. Mild increased echogenicity. No hydronephrosis or shadowing stone Abdominal aorta: No aneurysm visualized. Other findings: Small ascites. Small bilateral pleural effusions noted. IMPRESSION: 1. Cirrhosis and small ascites. 2. Patent main portal vein with hepatopetal flow. 3. Small ascites and bilateral pleural effusions. Electronically Signed   By: Anner Crete M.D.   On: 03/23/2022 03:44   DG Chest Port 1V same Day  Result Date: 03/22/2022 CLINICAL DATA:  Central line placement. EXAM: PORTABLE CHEST 1 VIEW COMPARISON:  Chest radiograph dated 03/22/2022. FINDINGS: Interval placement of a right IJ central venous line with tip projecting over the right atrium close to the cavoatrial junction. There is shallow inspiration. Probable small left pleural effusion and left lung base atelectasis. Pneumonia is not excluded. No pneumothorax. There is cardiomegaly mild vascular congestion. Atherosclerotic calcification of the aorta. Osteopenia with degenerative changes of the spine. No acute osseous pathology. IMPRESSION: Interval placement of a right IJ central venous line with tip projecting over the right atrium close to the cavoatrial junction. No pneumothorax. Electronically Signed   By: Anner Crete M.D.   On: 03/22/2022 19:48   DG Chest Portable 1 View  Result Date: 03/22/2022 CLINICAL DATA:  Generalized weakness. History of congestive heart failure. EXAM: PORTABLE CHEST 1 VIEW COMPARISON:  11/02/2021 FINDINGS: Heart size upper limits of normal. Chronic aortic atherosclerosis.  Probable small effusions, at least on the left. Minimal basilar atelectasis on the left. Upper lungs are clear. No widespread pulmonary edema. IMPRESSION: Aortic atherosclerosis. Probable pleural effusion, at least on the left, with mild left base volume loss. Electronically Signed   By: Nelson Chimes M.D.   On: 03/22/2022 14:47   CT Cervical Spine Wo Contrast  Result Date: 03/22/2022 CLINICAL DATA:  Neck pain extending into the left arm after falling 3 days ago. EXAM: CT CERVICAL SPINE WITHOUT CONTRAST TECHNIQUE: Multidetector CT imaging of the cervical spine was performed without intravenous contrast. Multiplanar CT image reconstructions were also generated. RADIATION DOSE REDUCTION: This exam was performed according to the departmental dose-optimization program which includes automated exposure control, adjustment of the mA and/or kV according to patient size and/or use of iterative reconstruction technique. COMPARISON:  None Available. FINDINGS: Despite efforts by the technologist and patient, mild motion artifact is present on today's exam and could not be eliminated. This reduces exam sensitivity and specificity. Alignment: 6 mm of anterolisthesis at C4-5.  Otherwise anatomic. Skull base and vertebrae: No evidence of acute fracture or traumatic subluxation. There is multilevel spondylosis with disc space narrowing, uncinate spurring and facet hypertrophy. Facet hypertrophy at C4-5 is felt to account for the anterolisthesis noted above. Soft tissues and spinal canal: No prevertebral fluid or swelling. No visible canal hematoma. Bilateral carotid atherosclerosis. Disc levels: Multilevel spondylosis with disc space narrowing, uncinate spurring and bilateral facet hypertrophy. At C3-4, there is moderate foraminal narrowing, worse on the left. At C4-5, there is mild to moderate spinal stenosis and severe foraminal narrowing bilaterally due to the anterolisthesis. Severe foraminal narrowing is also present  bilaterally at C5-6 due to spondylosis. Asymmetric severe left foraminal narrowing at C6-7 due to spondylosis. No acute disc space findings identified. Upper chest: Unremarkable. Other: None. IMPRESSION: 1. No evidence of acute cervical spine fracture, traumatic subluxation or static signs of instability. Detail limited by motion artifact. 2. 6 mm of anterolisthesis due to facet disease at C4-5. 3. Multilevel spondylosis with resulting osseous foraminal narrowing as detailed above. Electronically Signed   By: Richardean Sale M.D.   On: 03/22/2022 13:36   DG Shoulder Left  Result Date: 03/22/2022 CLINICAL DATA:  Fall EXAM: LEFT SHOULDER - 2+ VIEW COMPARISON:  None Available. FINDINGS: There is no evidence of fracture or dislocation. There is no evidence of arthropathy or other focal bone abnormality. Soft tissues are unremarkable. IMPRESSION: Negative. Electronically Signed   By: Macy Mis M.D.   On: 03/22/2022 13:28   DG Elbow Complete Left  Result Date: 03/22/2022 CLINICAL DATA:  Trauma, fall, pain EXAM: LEFT ELBOW - COMPLETE 3+  VIEW COMPARISON:  None Available. FINDINGS: No recent fracture or dislocation is seen. There is no significant displacement of posterior fat pad. Small bony spurs are noted. There are scattered vascular calcifications in the soft tissues. IMPRESSION: No recent fracture or dislocation is seen in the left elbow. Electronically Signed   By: Elmer Picker M.D.   On: 03/22/2022 13:22   IR Tips  Result Date: 03/18/2022 CLINICAL DATA:  [name] is a 81 y.o. 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 EXAM: 1. Ultrasound-guided paracentesis 2. Ultrasound-guided access of the right internal jugular vein 3. Ultrasound-guided access of the right common femoral vein 4. Hepatic venogram 5. Intravascular ultrasound 6. Catheterization of the portal vein 7.  Portal venous and central manometry 8. Portal venogram 9. Creation of a transhepatic portal vein to hepatic vein shunt MEDICATIONS: As antibiotic prophylaxis, Rocephin 1 gm IV was ordered pre-procedure and administered intravenously within one hour of incision. ANESTHESIA/SEDATION: General - as administered by the Anesthesia department CONTRAST:  Sixty ML Omnipaque 300, intravenous FLUOROSCOPY TIME:  Fluoroscopy Time: 15 minutes, 36 seconds (263 mGy). COMPLICATIONS: None immediate. PROCEDURE: The procedure was performed with the assistance of my partner, Dr. Daryll Brod. Informed written consent was obtained from the patient after a thorough discussion of the procedural risks, benefits and alternatives. All questions were addressed. Maximal Sterile Barrier Technique was utilized including caps, mask, sterile gowns, sterile gloves, sterile drape, hand hygiene and skin antiseptic. A timeout was performed prior to the initiation of the procedure. Preprocedure ultrasound evaluation the right lower quadrant demonstrated moderate to large volume ascites. A small skin nick was made in the right lower quadrant. Under direct ultrasound visualization, a 6 [place] skater drain was introduced and trocar fashion into the ascites. The inner needle was removed in there was brisk efflux of translucent, straw-colored ascites. During the procedure, the patient drained 2.8 L of ascites. The drain was removed upon completion and a sterile bandage was applied. A preliminary ultrasound of the right groin was performed and demonstrates a patent right common femoral vein. A permanent ultrasound image was recorded. Using a combination of fluoroscopy and ultrasound, an access site was determined. A small dermatotomy was made at the planned puncture site. Using ultrasound guidance, access into the right common femoral vein was obtained with visualization of needle entry into the vessel using a standard micropuncture technique. A wire was  advanced into the IVC insert all fascial dilation performed. An 8 [place], 11 cm vascular sheath was placed into the external iliac vein. Through this access site, an 60 [place] ICE catheter was advanced with ease under fluoroscopic guidance to the level of the intrahepatic inferior vena cava. A preliminary ultrasound of the right neck was performed and demonstrates a patent internal jugular vein. A permanent ultrasound image was recorded. Using a combination of fluoroscopy and ultrasound, an access site was determined. A small dermatotomy was made at the planned puncture site. Using ultrasound guidance, access into the right internal jugular vein was obtained with visualization of needle entry into the vessel using a standard micropuncture technique. A wire was advanced into the IVC and serial fascial dilation performed. A 10 French tips sheath was placed into the internal jugular vein and advanced to the IVC. The jugular sheath was retracted into the right atrium and manometry was performed. A 5 French angled tip catheter was then directed into the middle hepatic vein.  Hepatic venogram was performed. These images demonstrated patent hepatic vein with no stenosis. The catheter was advanced to a wedge portion of the a patent vein over which the 10 French sheath was advanced into the middle hepatic vein. Using ICE ultrasound visualization the catheter as middle hepatic vein as well as the portal anatomy was defined. A planned exit site from the hepatic vein and puncture site from the portal vein was placed into a single sonographic plane. Under direct ultrasound visualization, the ScorpionX needle was advanced into the central right portal vein. Hand injection of contrast confirmed position within the portal system. A Glidewire Advantage was then advanced into the splenic vein. A 5 French marking pigtail catheter was then advanced over the wire into the main portal vein and wire removed. Portal venogram was  performed which demonstrated a patent portal vein. A few very small varices were seen arising from the main portal vein. Portal manometry was then performed. The tract was then dilated to 8 mm with an 8 mm x 8 cm Athletis balloon. A 8-10 mm by 6 + 2 mm of Viatorr endograft was placed. There was no post deployment balloon molding performed due to appropriately decreased portosystemic gradient. After placement of the shunt, right atrial and portal pressures were repeated. Completion portal venogram demonstrates a patent TIPS endograft without significant gastroesophageal varices. The catheters and sheath were removed and manual compression was applied to the right internal jugular and right common femoral venous access sites until hemostasis was achieved. The patient was transferred to the PACU in stable condition. Pre-TIPS Mean Pressures (mmHg): Right atrium: 14 Portal vein: 27 Portosystemic gradient: 13 Post-TIPS Mean Pressures (mmHg): Right atrium:15 Portal vein: 17 Portosystemic gradient: 2 IMPRESSION: 1. Successful transjugular portosystemic shunt creation. 2. Portosystemic gradient of 13 mm Hg (absolute portal venous pressure 27 mm Hg) before shunt placement and 2 mm Hg (absolute portal venous pressure 17 mm Hg) after shunt placement. PLAN: The patient will be admitted to the Interventional Radiology service. Immediate post discharge follow-up will be arranged by the Portal Hypertension Clinic. Follow-up in IR clinic in 1 month. Ruthann Cancer, MD [facility] Radiology Electronically Signed   By: Ruthann Cancer M.D.   On: 03/18/2022 16:33   IR US Guide Vasc Access Right  Result Date: 03/18/2022 CLINICAL DATA:  [name] is a 81 y.o. 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 EXAM: 1. Ultrasound-guided paracentesis 2. Ultrasound-guided  access of the right internal jugular vein 3. Ultrasound-guided access of the right common femoral vein 4. Hepatic venogram 5. Intravascular ultrasound 6. Catheterization of the portal vein 7. Portal venous and central manometry 8. Portal venogram 9. Creation of a transhepatic portal vein to hepatic vein shunt MEDICATIONS: As antibiotic prophylaxis, Rocephin 1 gm IV was ordered pre-procedure and administered intravenously within one hour of incision. ANESTHESIA/SEDATION: General - as administered by the Anesthesia department CONTRAST:  Sixty ML Omnipaque 300, intravenous FLUOROSCOPY TIME:  Fluoroscopy Time: 15 minutes, 36 seconds (263 mGy). COMPLICATIONS: None immediate. PROCEDURE: The procedure was performed with the assistance of my partner, Dr. Daryll Brod. Informed written consent was obtained from the patient after a thorough discussion of the procedural risks, benefits and alternatives. All questions were addressed. Maximal Sterile Barrier Technique was utilized including caps, mask, sterile gowns, sterile gloves, sterile drape, hand hygiene and skin antiseptic. A timeout was performed prior to the initiation of  the procedure. Preprocedure ultrasound evaluation the right lower quadrant demonstrated moderate to large volume ascites. A small skin nick was made in the right lower quadrant. Under direct ultrasound visualization, a 6 [place] skater drain was introduced and trocar fashion into the ascites. The inner needle was removed in there was brisk efflux of translucent, straw-colored ascites. During the procedure, the patient drained 2.8 L of ascites. The drain was removed upon completion and a sterile bandage was applied. A preliminary ultrasound of the right groin was performed and demonstrates a patent right common femoral vein. A permanent ultrasound image was recorded. Using a combination of fluoroscopy and ultrasound, an access site was determined. A small dermatotomy was made at the planned puncture  site. Using ultrasound guidance, access into the right common femoral vein was obtained with visualization of needle entry into the vessel using a standard micropuncture technique. A wire was advanced into the IVC insert all fascial dilation performed. An 8 [place], 11 cm vascular sheath was placed into the external iliac vein. Through this access site, an 80 [place] ICE catheter was advanced with ease under fluoroscopic guidance to the level of the intrahepatic inferior vena cava. A preliminary ultrasound of the right neck was performed and demonstrates a patent internal jugular vein. A permanent ultrasound image was recorded. Using a combination of fluoroscopy and ultrasound, an access site was determined. A small dermatotomy was made at the planned puncture site. Using ultrasound guidance, access into the right internal jugular vein was obtained with visualization of needle entry into the vessel using a standard micropuncture technique. A wire was advanced into the IVC and serial fascial dilation performed. A 10 French tips sheath was placed into the internal jugular vein and advanced to the IVC. The jugular sheath was retracted into the right atrium and manometry was performed. A 5 French angled tip catheter was then directed into the middle hepatic vein. Hepatic venogram was performed. These images demonstrated patent hepatic vein with no stenosis. The catheter was advanced to a wedge portion of the a patent vein over which the 10 French sheath was advanced into the middle hepatic vein. Using ICE ultrasound visualization the catheter as middle hepatic vein as well as the portal anatomy was defined. A planned exit site from the hepatic vein and puncture site from the portal vein was placed into a single sonographic plane. Under direct ultrasound visualization, the ScorpionX needle was advanced into the central right portal vein. Hand injection of contrast confirmed position within the portal system. A  Glidewire Advantage was then advanced into the splenic vein. A 5 French marking pigtail catheter was then advanced over the wire into the main portal vein and wire removed. Portal venogram was performed which demonstrated a patent portal vein. A few very small varices were seen arising from the main portal vein. Portal manometry was then performed. The tract was then dilated to 8 mm with an 8 mm x 8 cm Athletis balloon. A 8-10 mm by 6 + 2 mm of Viatorr endograft was placed. There was no post deployment balloon molding performed due to appropriately decreased portosystemic gradient. After placement of the shunt, right atrial and portal pressures were repeated. Completion portal venogram demonstrates a patent TIPS endograft without significant gastroesophageal varices. The catheters and sheath were removed and manual compression was applied to the right internal jugular and right common femoral venous access sites until hemostasis was achieved. The patient was transferred to the PACU in stable condition. Pre-TIPS Mean Pressures (mmHg): Right  atrium: 14 Portal vein: 27 Portosystemic gradient: 13 Post-TIPS Mean Pressures (mmHg): Right atrium:15 Portal vein: 17 Portosystemic gradient: 2 IMPRESSION: 1. Successful transjugular portosystemic shunt creation. 2. Portosystemic gradient of 13 mm Hg (absolute portal venous pressure 27 mm Hg) before shunt placement and 2 mm Hg (absolute portal venous pressure 17 mm Hg) after shunt placement. PLAN: The patient will be admitted to the Interventional Radiology service. Immediate post discharge follow-up will be arranged by the Portal Hypertension Clinic. Follow-up in IR clinic in 1 month. Ruthann Cancer, MD [facility] Radiology Electronically Signed   By: Ruthann Cancer M.D.   On: 03/18/2022 16:33   IR US Guide Vasc Access Right  Result Date: 03/18/2022 CLINICAL DATA:  [name] is a 81 y.o. 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 EXAM: 1. Ultrasound-guided paracentesis 2. Ultrasound-guided access of the right internal jugular vein 3. Ultrasound-guided access of the right common femoral vein 4. Hepatic venogram 5. Intravascular ultrasound 6. Catheterization of the portal vein 7. Portal venous and central manometry 8. Portal venogram 9. Creation of a transhepatic portal vein to hepatic vein shunt MEDICATIONS: As antibiotic prophylaxis, Rocephin 1 gm IV was ordered pre-procedure and administered intravenously within one hour of incision. ANESTHESIA/SEDATION: General - as administered by the Anesthesia department CONTRAST:  Sixty ML Omnipaque 300, intravenous FLUOROSCOPY TIME:  Fluoroscopy Time: 15 minutes, 36 seconds (263 mGy). COMPLICATIONS: None immediate. PROCEDURE: The procedure was performed with the assistance of my partner, Dr. Daryll Brod. Informed written consent was obtained from the patient after a thorough discussion of the procedural risks, benefits and alternatives. All questions were addressed. Maximal Sterile Barrier Technique was utilized including caps, mask, sterile gowns, sterile gloves, sterile drape, hand hygiene and skin antiseptic. A timeout was performed prior to the initiation of the procedure. Preprocedure ultrasound evaluation the right lower quadrant demonstrated moderate to large volume ascites. A small skin nick was made in the right lower quadrant. Under direct ultrasound visualization, a 6 [place] skater drain was introduced and trocar fashion into the ascites. The inner needle was removed in there was brisk efflux of translucent, straw-colored ascites. During the procedure, the patient drained 2.8 L of ascites. The drain was removed upon completion and a sterile bandage was applied. A preliminary ultrasound of the right groin was performed and demonstrates a patent right common  femoral vein. A permanent ultrasound image was recorded. Using a combination of fluoroscopy and ultrasound, an access site was determined. A small dermatotomy was made at the planned puncture site. Using ultrasound guidance, access into the right common femoral vein was obtained with visualization of needle entry into the vessel using a standard micropuncture technique. A wire was advanced into the IVC insert all fascial dilation performed. An 8 [place], 11 cm vascular sheath was placed into the external iliac vein. Through this access site, an 41 [place] ICE catheter was advanced with ease under fluoroscopic guidance to the level of the intrahepatic inferior vena cava. A preliminary ultrasound of the right neck was performed and demonstrates a patent internal jugular vein. A permanent ultrasound image was recorded. Using a combination of fluoroscopy and ultrasound, an access site was determined. A small dermatotomy was made at the planned puncture site. Using ultrasound guidance, access into the right internal jugular vein was obtained with visualization of needle entry into the vessel using a standard micropuncture technique. A wire  was advanced into the IVC and serial fascial dilation performed. A 10 French tips sheath was placed into the internal jugular vein and advanced to the IVC. The jugular sheath was retracted into the right atrium and manometry was performed. A 5 French angled tip catheter was then directed into the middle hepatic vein. Hepatic venogram was performed. These images demonstrated patent hepatic vein with no stenosis. The catheter was advanced to a wedge portion of the a patent vein over which the 10 French sheath was advanced into the middle hepatic vein. Using ICE ultrasound visualization the catheter as middle hepatic vein as well as the portal anatomy was defined. A planned exit site from the hepatic vein and puncture site from the portal vein was placed into a single sonographic  plane. Under direct ultrasound visualization, the ScorpionX needle was advanced into the central right portal vein. Hand injection of contrast confirmed position within the portal system. A Glidewire Advantage was then advanced into the splenic vein. A 5 French marking pigtail catheter was then advanced over the wire into the main portal vein and wire removed. Portal venogram was performed which demonstrated a patent portal vein. A few very small varices were seen arising from the main portal vein. Portal manometry was then performed. The tract was then dilated to 8 mm with an 8 mm x 8 cm Athletis balloon. A 8-10 mm by 6 + 2 mm of Viatorr endograft was placed. There was no post deployment balloon molding performed due to appropriately decreased portosystemic gradient. After placement of the shunt, right atrial and portal pressures were repeated. Completion portal venogram demonstrates a patent TIPS endograft without significant gastroesophageal varices. The catheters and sheath were removed and manual compression was applied to the right internal jugular and right common femoral venous access sites until hemostasis was achieved. The patient was transferred to the PACU in stable condition. Pre-TIPS Mean Pressures (mmHg): Right atrium: 14 Portal vein: 27 Portosystemic gradient: 13 Post-TIPS Mean Pressures (mmHg): Right atrium:15 Portal vein: 17 Portosystemic gradient: 2 IMPRESSION: 1. Successful transjugular portosystemic shunt creation. 2. Portosystemic gradient of 13 mm Hg (absolute portal venous pressure 27 mm Hg) before shunt placement and 2 mm Hg (absolute portal venous pressure 17 mm Hg) after shunt placement. PLAN: The patient will be admitted to the Interventional Radiology service. Immediate post discharge follow-up will be arranged by the Portal Hypertension Clinic. Follow-up in IR clinic in 1 month. Ruthann Cancer, MD [facility] Radiology Electronically Signed    By: Ruthann Cancer M.D.   On: 03/18/2022 16:33   IR Paracentesis  Result Date: 03/18/2022 CLINICAL DATA:  [name] is a 81 y.o. 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 EXAM: 1. Ultrasound-guided paracentesis 2. Ultrasound-guided access of the right internal jugular vein 3. Ultrasound-guided access of the right common femoral vein 4. Hepatic venogram 5. Intravascular ultrasound 6. Catheterization of the portal vein 7. Portal venous and central manometry 8. Portal venogram 9. Creation of a transhepatic portal vein to hepatic vein shunt MEDICATIONS: As antibiotic prophylaxis, Rocephin 1 gm IV was ordered pre-procedure and administered intravenously within one hour of incision. ANESTHESIA/SEDATION: General - as administered by the Anesthesia department CONTRAST:  Sixty ML Omnipaque 300, intravenous FLUOROSCOPY TIME:  Fluoroscopy Time: 15 minutes, 36 seconds (263 mGy). COMPLICATIONS: None immediate. PROCEDURE: The procedure was performed with the assistance of my partner, Dr. Daryll Brod. Informed written  consent was obtained from the patient after a thorough discussion of the procedural risks, benefits and alternatives. All questions were addressed. Maximal Sterile Barrier Technique was utilized including caps, mask, sterile gowns, sterile gloves, sterile drape, hand hygiene and skin antiseptic. A timeout was performed prior to the initiation of the procedure. Preprocedure ultrasound evaluation the right lower quadrant demonstrated moderate to large volume ascites. A small skin nick was made in the right lower quadrant. Under direct ultrasound visualization, a 6 [place] skater drain was introduced and trocar fashion into the ascites. The inner needle was removed in there was brisk efflux of translucent, straw-colored ascites. During the procedure, the patient drained 2.8 L of ascites.  The drain was removed upon completion and a sterile bandage was applied. A preliminary ultrasound of the right groin was performed and demonstrates a patent right common femoral vein. A permanent ultrasound image was recorded. Using a combination of fluoroscopy and ultrasound, an access site was determined. A small dermatotomy was made at the planned puncture site. Using ultrasound guidance, access into the right common femoral vein was obtained with visualization of needle entry into the vessel using a standard micropuncture technique. A wire was advanced into the IVC insert all fascial dilation performed. An 8 [place], 11 cm vascular sheath was placed into the external iliac vein. Through this access site, an 66 [place] ICE catheter was advanced with ease under fluoroscopic guidance to the level of the intrahepatic inferior vena cava. A preliminary ultrasound of the right neck was performed and demonstrates a patent internal jugular vein. A permanent ultrasound image was recorded. Using a combination of fluoroscopy and ultrasound, an access site was determined. A small dermatotomy was made at the planned puncture site. Using ultrasound guidance, access into the right internal jugular vein was obtained with visualization of needle entry into the vessel using a standard micropuncture technique. A wire was advanced into the IVC and serial fascial dilation performed. A 10 French tips sheath was placed into the internal jugular vein and advanced to the IVC. The jugular sheath was retracted into the right atrium and manometry was performed. A 5 French angled tip catheter was then directed into the middle hepatic vein. Hepatic venogram was performed. These images demonstrated patent hepatic vein with no stenosis. The catheter was advanced to a wedge portion of the a patent vein over which the 10 French sheath was advanced into the middle hepatic vein. Using ICE ultrasound visualization the catheter as middle hepatic  vein as well as the portal anatomy was defined. A planned exit site from the hepatic vein and puncture site from the portal vein was placed into a single sonographic plane. Under direct ultrasound visualization, the ScorpionX needle was advanced into the central right portal vein. Hand injection of contrast confirmed position within the portal system. A Glidewire Advantage was then advanced into the splenic vein. A 5 French marking pigtail catheter was then advanced over the wire into the main portal vein and wire removed. Portal venogram was performed which demonstrated a patent portal vein. A few very small varices were seen arising from the main portal vein. Portal manometry was then performed. The tract was then dilated to 8 mm with an 8 mm x 8 cm Athletis balloon. A 8-10 mm by 6 + 2 mm of Viatorr endograft was placed. There was no post deployment balloon molding performed due to appropriately decreased portosystemic gradient. After placement of the shunt, right atrial and portal pressures were repeated. Completion portal venogram demonstrates  a patent TIPS endograft without significant gastroesophageal varices. The catheters and sheath were removed and manual compression was applied to the right internal jugular and right common femoral venous access sites until hemostasis was achieved. The patient was transferred to the PACU in stable condition. Pre-TIPS Mean Pressures (mmHg): Right atrium: 14 Portal vein: 27 Portosystemic gradient: 13 Post-TIPS Mean Pressures (mmHg): Right atrium:15 Portal vein: 17 Portosystemic gradient: 2 IMPRESSION: 1. Successful transjugular portosystemic shunt creation. 2. Portosystemic gradient of 13 mm Hg (absolute portal venous pressure 27 mm Hg) before shunt placement and 2 mm Hg (absolute portal venous pressure 17 mm Hg) after shunt placement. PLAN: The patient will be admitted to the Interventional Radiology service. Immediate post discharge follow-up will be arranged by the Portal  Hypertension Clinic. Follow-up in IR clinic in 1 month. Ruthann Cancer, MD [facility]iology [facility] Radiology Electronically Signed   By: Ruthann Cancer M.D.   On: 03/18/2022 16:33   IR INTRAVASCULAR ULTRASOUND NON CORONARY  Result Date: 03/18/2022 CLINICAL DATA:  [name] is a 81 y.o. 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 EXAM: 1. Ultrasound-guided paracentesis 2. Ultrasound-guided access of the right internal jugular vein 3. Ultrasound-guided access of the right common femoral vein 4. Hepatic venogram 5. Intravascular ultrasound 6. Catheterization of the portal vein 7. Portal venous and central manometry 8. Portal venogram 9. Creation of a transhepatic portal vein to hepatic vein shunt MEDICATIONS: As antibiotic prophylaxis, Rocephin 1 gm IV was ordered pre-procedure and administered intravenously within one hour of incision. ANESTHESIA/SEDATION: General - as administered by the Anesthesia department CONTRAST:  Sixty ML Omnipaque 300, intravenous FLUOROSCOPY TIME:  Fluoroscopy Time: 15 minutes, 36 seconds (263 mGy). COMPLICATIONS: None immediate. PROCEDURE: The procedure was performed with the assistance of my partner, Dr. Daryll Brod. Informed written consent was obtained from the patient after a thorough discussion of the procedural risks, benefits and alternatives. All questions were addressed. Maximal Sterile Barrier Technique was utilized including caps, mask, sterile gowns, sterile gloves, sterile drape, hand hygiene and skin antiseptic. A timeout was performed prior to the initiation of the procedure. Preprocedure ultrasound evaluation the right lower quadrant demonstrated moderate to large volume ascites. A small skin nick was made in the right lower quadrant. Under direct ultrasound visualization, a 6 [place] skater drain was introduced  and trocar fashion into the ascites. The inner needle was removed in there was brisk efflux of translucent, straw-colored ascites. During the procedure, the patient drained 2.8 L of ascites. The drain was removed upon completion and a sterile bandage was applied. A preliminary ultrasound of the right groin was performed and demonstrates a patent right common femoral vein. A permanent ultrasound image was recorded. Using a combination of fluoroscopy and ultrasound, an access site was determined. A small dermatotomy was made at the planned puncture site. Using ultrasound guidance, access into the right common femoral vein was obtained with visualization of needle entry into the vessel using a standard micropuncture technique. A wire was advanced into the IVC insert all fascial dilation performed. An 8 [place], 11 cm vascular sheath was placed into the external iliac vein. Through this access site, an 24 [place] ICE catheter was advanced with ease under fluoroscopic guidance to the level of the intrahepatic inferior vena cava. A preliminary ultrasound of the right neck was performed and demonstrates a patent internal jugular vein. A permanent ultrasound image was recorded.  Using a combination of fluoroscopy and ultrasound, an access site was determined. A small dermatotomy was made at the planned puncture site. Using ultrasound guidance, access into the right internal jugular vein was obtained with visualization of needle entry into the vessel using a standard micropuncture technique. A wire was advanced into the IVC and serial fascial dilation performed. A 10 French tips sheath was placed into the internal jugular vein and advanced to the IVC. The jugular sheath was retracted into the right atrium and manometry was performed. A 5 French angled tip catheter was then directed into the middle hepatic vein. Hepatic venogram was performed. These images demonstrated patent hepatic vein with no stenosis. The catheter  was advanced to a wedge portion of the a patent vein over which the 10 French sheath was advanced into the middle hepatic vein. Using ICE ultrasound visualization the catheter as middle hepatic vein as well as the portal anatomy was defined. A planned exit site from the hepatic vein and puncture site from the portal vein was placed into a single sonographic plane. Under direct ultrasound visualization, the ScorpionX needle was advanced into the central right portal vein. Hand injection of contrast confirmed position within the portal system. A Glidewire Advantage was then advanced into the splenic vein. A 5 French marking pigtail catheter was then advanced over the wire into the main portal vein and wire removed. Portal venogram was performed which demonstrated a patent portal vein. A few very small varices were seen arising from the main portal vein. Portal manometry was then performed. The tract was then dilated to 8 mm with an 8 mm x 8 cm Athletis balloon. A 8-10 mm by 6 + 2 mm of Viatorr endograft was placed. There was no post deployment balloon molding performed due to appropriately decreased portosystemic gradient. After placement of the shunt, right atrial and portal pressures were repeated. Completion portal venogram demonstrates a patent TIPS endograft without significant gastroesophageal varices. The catheters and sheath were removed and manual compression was applied to the right internal jugular and right common femoral venous access sites until hemostasis was achieved. The patient was transferred to the PACU in stable condition. Pre-TIPS Mean Pressures (mmHg): Right atrium: 14 Portal vein: 27 Portosystemic gradient: 13 Post-TIPS Mean Pressures (mmHg): Right atrium:15 Portal vein: 17 Portosystemic gradient: 2 IMPRESSION: 1. Successful transjugular portosystemic shunt creation. 2. Portosystemic gradient of 13 mm Hg (absolute portal venous pressure 27 mm Hg) before shunt placement and 2 mm Hg (absolute  portal venous pressure 17 mm Hg) after shunt placement. PLAN: The patient will be admitted to the Interventional Radiology service. Immediate post discharge follow-up will be arranged by the Portal Hypertension Clinic. Follow-up in IR clinic in 1 month. Ruthann Cancer, MD [facility] Interventional Radiology Special[facility] Radiology Electronically Signed   By: Ruthann Cancer M.D.   On: 03/18/2022 16:33   MR LIVER W WO CONTRAST  Result Date: 03/18/2022 CLINICAL DATA:  Hepatocellular carcinoma EXAM: MRI ABDOMEN WITHOUT AND WITH CONTRAST TECHNIQUE: Multiplanar multisequence MR imaging of the abdomen was performed both before and after the administration of intravenous contrast. CONTRAST:  82m GADAVIST GADOBUTROL 1 MMOL/ML IV SOLN COMPARISON:  CT scan 02/04/2022 and MRI from 11/13/2021 FINDINGS: Lower chest: Small hiatal hernia containing mostly fluid, vessels, and adipose tissue, similar to prior. Hepatobiliary: Cirrhosis. Treated lesion in segment 4 of the liver with surrounding fiducial markers currently demonstrates a 1-2 mm thick rim of enhancement on all phases as on image 25 series 16, without definite 5  0 tumor. Treated lesion in segment 3 of the liver is relatively occult on today's exam, without obvious hypo or hyperenhancement in the vicinity of this lesion. No new worrisome arterial phase enhancement is identified in the liver. Pancreas:  Unremarkable Spleen: 7 mm hypoenhancing lesion anteriorly in the spleen on image 37 series 24, stable from prior MRI and likely benign/incidental. Adrenals/Urinary Tract: Renal atrophy. Benign 1.1 cm right mid kidney cyst with single internal septation compatible with Bosniak category 2. Stomach/Bowel: Metal artifact from hemostasis clip in the descending duodenum surrounds the duodenum and surrounding structures. Vascular/Lymphatic: Atherosclerosis is present, including aortoiliac atherosclerotic disease. Portions of the abdominal aorta and IVC are obscured by  metal artifact from the hemostasis clip in the duodenum. Other:  Substantial upper abdominal ascites. Musculoskeletal: Lumbar spondylosis and degenerative disc disease. IMPRESSION: 1. No compelling findings of viable tumor at this time. The treated lesion in segment 4 of the liver demonstrates thin rim enhancement without nodularity. The treated lesion in segment 3 of the liver is relatively occult/indistinct on today's exam. No new arterial phase enhancing lesions in the liver are identified. Continued surveillance may be warranted. 2. Other imaging findings of potential clinical significance: Stable hiatal hernia containing mostly fluid, vessels, and adipose tissue. Hepatic cirrhosis. Bilateral renal atrophy. Substantial upper abdominal ascites. Lumbar spondylosis and degenerative disc disease. Extensive atherosclerosis. Aortic Atherosclerosis (ICD10-I70.0). Electronically Signed   By: Van Clines M.D.   On: 03/18/2022 09:24   IR Paracentesis  Result Date: 03/15/2022 INDICATION: Cirrhosis, recurrent ascites EXAM: ULTRASOUND GUIDED right lower quadrant PARACENTESIS MEDICATIONS: None. COMPLICATIONS: None immediate. PROCEDURE: Informed written consent was obtained from the patient after a discussion of the risks, benefits and alternatives to treatment. A timeout was performed prior to the initiation of the procedure. Initial ultrasound scanning demonstrates a large amount of ascites within the right lower abdominal quadrant. The right lower abdomen was prepped and draped in the usual sterile fashion. 1% lidocaine was used for local anesthesia. Following this, a 19 gauge, 7-cm, Yueh catheter was introduced. An ultrasound image was saved for documentation purposes. The paracentesis was performed. The catheter was removed and a dressing was applied. The patient tolerated the procedure well without immediate post procedural complication. FINDINGS: A total of approximately 4.5L of clear ascitic fluid was  removed. Samples were sent to the laboratory as requested by the clinical team. IMPRESSION: Successful ultrasound-guided paracentesis yielding 4.5 liters of peritoneal fluid. Note: TIPS planned for Friday 03/18/2022 Performed and Read by Pasty Spillers, PA-C Electronically Signed   By: Ruthann Cancer M.D.   On: 03/15/2022 14:22     Subjective: No acute issues or events overnight   Discharge Exam: Vitals:   04/06/22 2009 04/07/22 0818  BP: (!) 113/53 (!) 79/70  Pulse: (!) 103 (!) 112  Resp:  16  Temp: 97.6 F (36.4 C) 98.2 F (36.8 C)  SpO2: 91% 97%   Vitals:   04/04/22 1535 04/04/22 2000 04/06/22 2009 04/07/22 0818  BP: (!) 117/58  (!) 113/53 (!) 79/70  Pulse: (!) 108  (!) 103 (!) 112  Resp: 20   16  Temp: 98.2 F (36.8 C)  97.6 F (36.4 C) 98.2 F (36.8 C)  TempSrc: Oral  Oral Oral  SpO2: 94% 94% 91% 97%  Weight:      Height:        General: Pt is alert, awake, not in acute distress Cardiovascular: RRR, S1/S2 +, no rubs, no gallops Respiratory: CTA bilaterally, no wheezing, no rhonchi Abdominal: Soft, NT,  ND, bowel sounds + Extremities: no edema, no cyanosis  The results of significant diagnostics from this hospitalization (including imaging, microbiology, ancillary and laboratory) are listed below for reference.     Microbiology: Recent Results (from the past 240 hour(s))  SARS Coronavirus 2 by RT PCR (hospital order, performed in [facility] hospital lab) *cepheid single result test* Anterior Nasal Swab     Status: None   Collection Time: 04/06/22  6:58 PM   Specimen: Anterior Nasal Swab  Result Value Ref Range Status   SARS Coronavirus 2 by RT PCR NEGATIVE NEGATIVE Final    Comment: (NOTE) SARS-CoV-2 target nucleic acids are NOT DETECTED.  The SARS-CoV-2 RNA is generally detectable in upper and lower respiratory specimens during the acute phase of infection. The lowest concentration of SARS-CoV-2 viral copies this assay can detect is 250 copies / mL. A  negative result does not preclude SARS-CoV-2 infection and should not be used as the sole basis for treatment or other patient management decisions.  A negative result may occur with improper specimen collection / handling, submission of specimen other than nasopharyngeal swab, presence of viral mutation(s) within the areas targeted by this assay, and inadequate number of viral copies (<250 copies / mL). A negative result must be combined with clinical observations, patient history, and epidemiological information.  Fact Sheet for Patients:   https://www.patel.info/  Fact Sheet for Healthcare Providers: https://hall.com/  This test is not yet approved or  cleared by the [place] FDA and has been authorized for detection and/or diagnosis of SARS-CoV-2 by FDA under an Emergency Use Authorization (EUA).  This EUA will remain in effect (meaning this test can be used) for the duration of the COVID-19 declaration under Section 564(b)(1) of the Act, 21 U.S.C. section 360bbb-3(b)(1), unless the authorization is terminated or revoked sooner.  Performed at [facility], [place] [address] [place], [place] [address]      Labs: BNP (last 3 results) Recent Labs    03/23/22 0003  BNP 284.1*   Basic Metabolic Panel: Recent Labs  Lab 04/01/22 0113 04/02/22 0156 04/03/22 0033 04/04/22 0424  NA 144 141 143 145  K 4.4 4.8 5.2* 5.0  CL 108 106 108 109  CO2 '29 27 27 27  '$ GLUCOSE 148* 188* 165* 106*  BUN 59* 65* 64* 62*  CREATININE 1.20 1.22 1.26* 1.28*  CALCIUM 8.6* 8.7* 9.1 9.1  MG 1.9 1.8 2.0 2.0   Liver Function Tests: Recent Labs  Lab 04/04/22 0424  AST 48*  ALT 33  ALKPHOS 331*  BILITOT 5.3*  PROT 5.3*  ALBUMIN 2.5*   No results for input(s): "LIPASE", "AMYLASE" in the last 168 hours. Recent Labs  Lab 04/02/22 1824 04/04/22 0842  AMMONIA 127* 127*   CBC: Recent Labs  Lab 04/04/22 0424  WBC 6.5  NEUTROABS 5.0   HGB 8.5*  HCT 27.4*  MCV 103.0*  PLT 80*   Cardiac Enzymes: No results for input(s): "CKTOTAL", "CKMB", "CKMBINDEX", "TROPONINI" in the last 168 hours. BNP: Invalid input(s): "POCBNP" CBG: Recent Labs  Lab 04/04/22 0021 04/04/22 0453 04/04/22 0720 04/04/22 1147 04/04/22 1537  GLUCAP 110* 99 101* 114* 110*   D-Dimer No results for input(s): "DDIMER" in the last 72 hours. Hgb A1c No results for input(s): "HGBA1C" in the last 72 hours. Lipid Profile No results for input(s): "CHOL", "HDL", "LDLCALC", "TRIG", "CHOLHDL", "LDLDIRECT" in the last 72 hours. Thyroid function studies No results for input(s): "TSH", "T4TOTAL", "T3FREE", "THYROIDAB" in the last 72 hours.  Invalid input(s): "FREET3" Anemia work up No results for input(s): "VITAMINB12", "FOLATE", "FERRITIN", "TIBC", "IRON", "RETICCTPCT" in the last 72 hours. Urinalysis    Component Value Date/Time   COLORURINE AMBER (A) 03/23/2022 1400   APPEARANCEUR HAZY (A) 03/23/2022 1400   LABSPEC 1.030 03/23/2022 1400   PHURINE 5.0 03/23/2022 1400   GLUCOSEU NEGATIVE 03/23/2022 1400   GLUCOSEU NEGATIVE 10/22/2018 1451   HGBUR NEGATIVE 03/23/2022 1400   HGBUR negative 05/14/2008 1032   BILIRUBINUR NEGATIVE 03/23/2022 1400   BILIRUBINUR neg 05/15/2017 1330   KETONESUR NEGATIVE 03/23/2022 1400   PROTEINUR NEGATIVE 03/23/2022 1400   UROBILINOGEN 0.2 10/22/2018 1451   NITRITE NEGATIVE 03/23/2022 1400   LEUKOCYTESUR TRACE (A) 03/23/2022 1400   Sepsis Labs Recent Labs  Lab 04/04/22 0424  WBC 6.5   Microbiology Recent Results (from the past 240 hour(s))  SARS Coronavirus 2 by RT PCR (hospital order, performed in [facility] hospital lab) *cepheid single result test* Anterior Nasal Swab     Status: None   Collection Time: 04/06/22  6:58 PM   Specimen: Anterior Nasal Swab  Result Value Ref Range Status   SARS Coronavirus 2 by RT PCR NEGATIVE NEGATIVE Final    Comment: (NOTE) SARS-CoV-2 target nucleic acids are NOT  DETECTED.  The SARS-CoV-2 RNA is generally detectable in upper and lower respiratory specimens during the acute phase of infection. The lowest concentration of SARS-CoV-2 viral copies this assay can detect is 250 copies / mL. A negative result does not preclude SARS-CoV-2 infection and should not be used as the sole basis for treatment or other patient management decisions.  A negative result may occur with improper specimen collection / handling, submission of specimen other than nasopharyngeal swab, presence of viral mutation(s) within the areas targeted by this assay, and inadequate number of viral copies (<250 copies / mL). A negative result must be combined with clinical observations, patient history, and epidemiological information.  Fact Sheet for Patients:   https://www.patel.info/  Fact Sheet for Healthcare Providers: https://hall.com/  This test is not yet approved or  cleared by the [place] FDA and has been authorized for detection and/or diagnosis of SARS-CoV-2 by FDA under an Emergency Use Authorization (EUA).  This EUA will remain in effect (meaning this test can be used) for the duration of the COVID-19 declaration under Section 564(b)(1) of the Act, 21 U.S.C. section 360bbb-3(b)(1), unless the authorization is terminated or revoked sooner.  Performed at [facility], [name] [address], [place], [place] [address]      Time coordinating discharge: Over 30 minutes  SIGNED:   Little Ishikawa, DO [facility] 04/07/2022, 2:22 PM Pager   If 7PM-7AM, please contact night-coverage [email or web address]

## 2022-04-07 NOTE — Progress Notes (Addendum)
2pm: CSW spoke with [facility] who states she is arranging transportation and will return call to [place] with a pickup time.  CSW notified RN and MD of information.  11:45am: CSW spoke with Dr. Ronne Binning who states the patient has been approved for inpatient hospice but [facility] does not have any beds at this time.  11am: CSW spoke with Dr. Zollie Beckers at [facility] who states he is reviewing patient's clinicals at this time and will review most recent palliative note.  Madilyn Fireman, MSW, LCSW Transitions of Care  Clinical Social Worker II [phone]

## 2022-04-07 NOTE — Progress Notes (Addendum)
Patient ID:[name]      DOB: 1940/10/24      YTK:[id number]      Palliative Medicine Team    Subjective: Bedside symptom check completed. [name] bedside at time of visit.    Physical exam: Patient resting in bed with eyes closed at time of visit. Breathing even and non-labored, no excessive secretions noted. Patient's family reports physical signs of pain this morning with increased fidgeting and restlessness. This RN helped to rearrange pillows for comfort. Patient's extremities warm to touch with palpable radial pulses present, generalized +1 edema with significant edema to left upper extremity noted.    Assessment and plan: This RN touched base with bedside RN Amy regarding administering pain medication and patient's request for chocolate ice cream. She is agreeable without any additional needs identified. [name] understanding that we are still waiting to hear back from residential hospice house, will communicate with him when approved. Patient remains stable for transfer/transport this morning. Will continue to follow for any changes or advances.    Thank you for allowing the Palliative Medicine Team to assist in the care of this patient.     Joshua Leavell, MSN, RN Palliative Medicine Team Team Phone: [phone]  This phone is monitored 7a-7p, please reach out to attending physician outside of these hours for urgent needs.

## 2022-04-15 ENCOUNTER — Encounter: Payer: Self-pay | Admitting: Family Medicine

## 2022-04-15 NOTE — Telephone Encounter (Signed)
I just received papers today  Need more info from family- mychart sent

## 2022-05-03 ENCOUNTER — Ambulatory Visit: Payer: Medicare HMO | Admitting: Podiatry

## 2022-05-10 DEATH — deceased

## 2022-06-02 ENCOUNTER — Ambulatory Visit: Payer: Medicare HMO | Admitting: Cardiology

## 2022-06-29 ENCOUNTER — Ambulatory Visit: Payer: Medicare HMO | Admitting: Hematology

## 2022-06-29 ENCOUNTER — Other Ambulatory Visit: Payer: Medicare HMO

## 2022-07-25 ENCOUNTER — Telehealth: Payer: Medicare HMO

## 2022-09-30 IMAGING — US US BIOPSY CORE LIVER
1 series · 13 of 20 positions shown · non-contrast
Comparison: none

INDICATION: 79-year-old male with hepatocellular carcinoma. Unsuccessful
attempted trans arterial embolization secondary to occlusive
vascular disease. He is now set to undergo SBRT. He presents today
for ultrasound-guided placement of fiducials to facilitate lesion
localization at the time of SBRT.

[Series 1: us biopsy core liver · 13 of 20 slices shown]
[im 1/20]
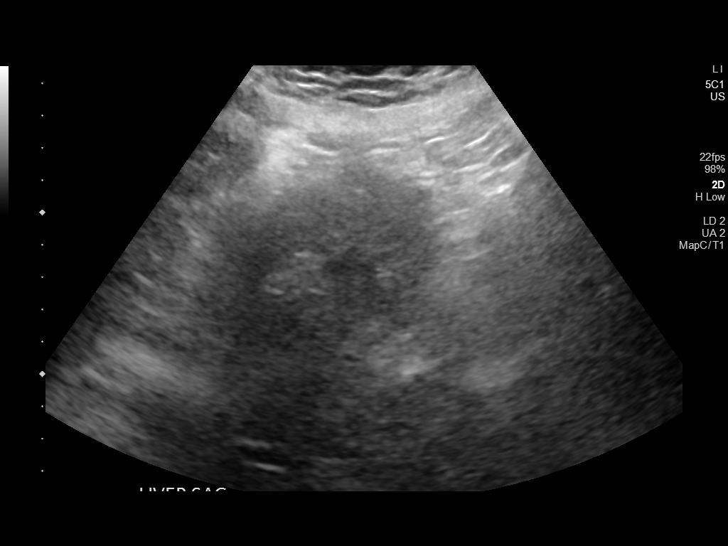
[im 3/20]
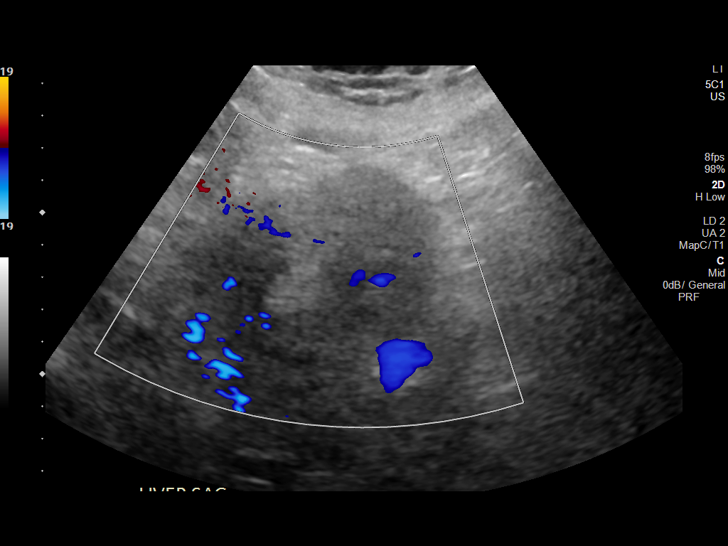
[im 4/20]
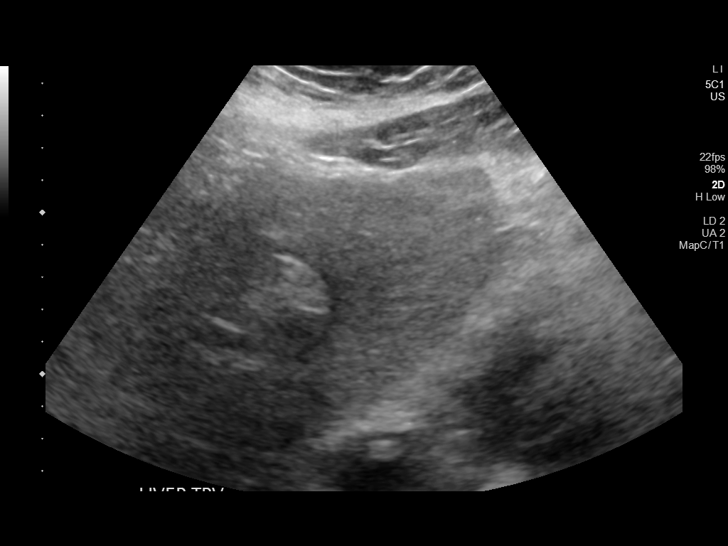
[im 6/20]
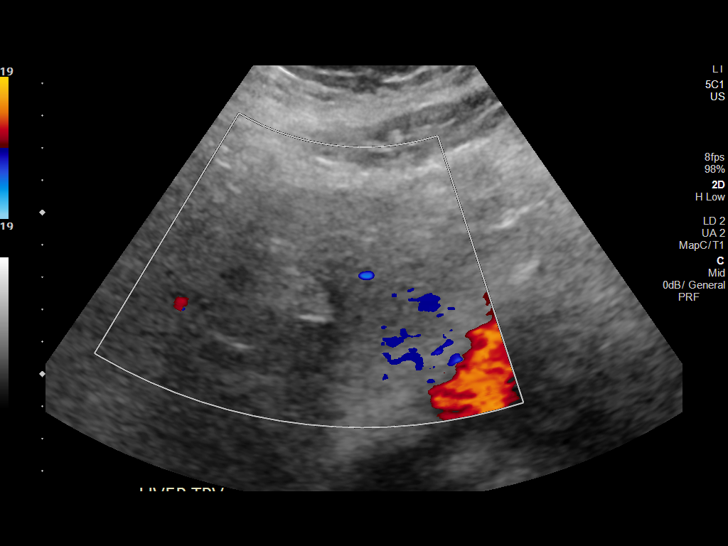
[im 7/20]
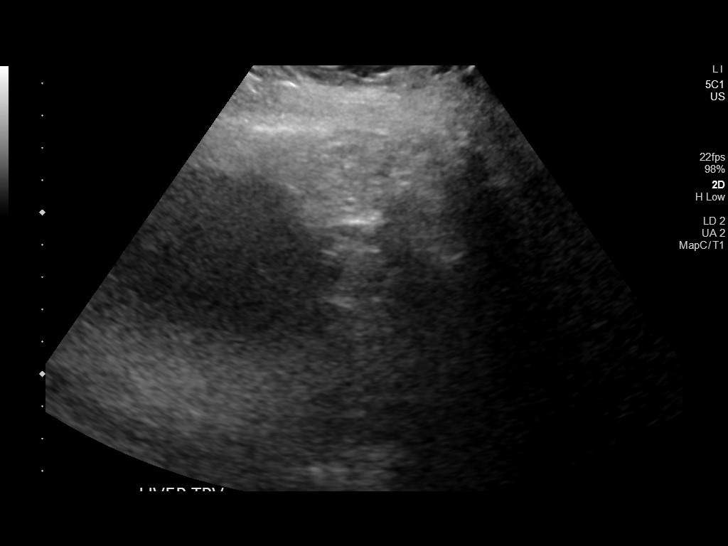
[im 9/20]
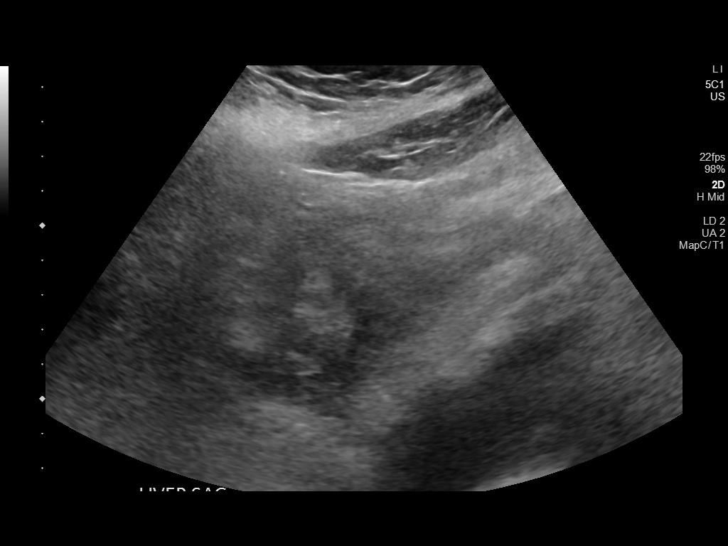
[im 11/20]
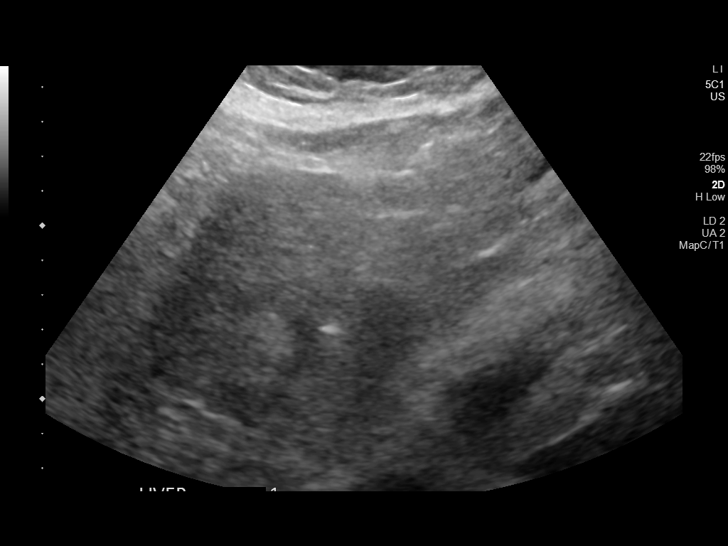
[im 12/20]
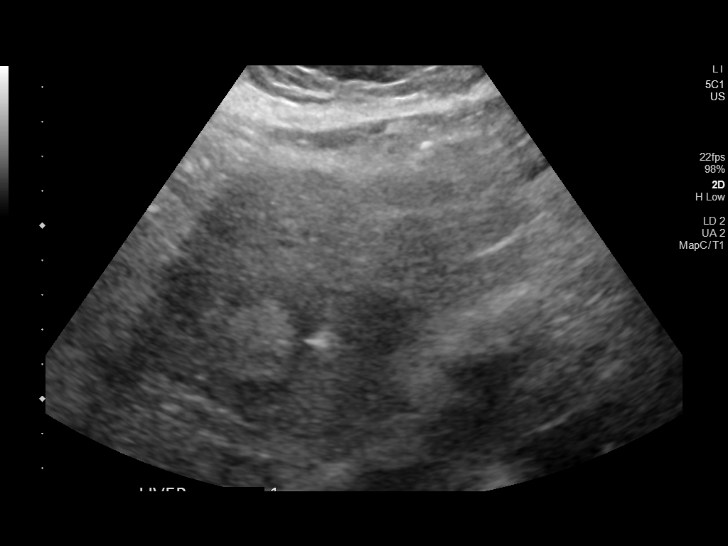
[im 14/20]
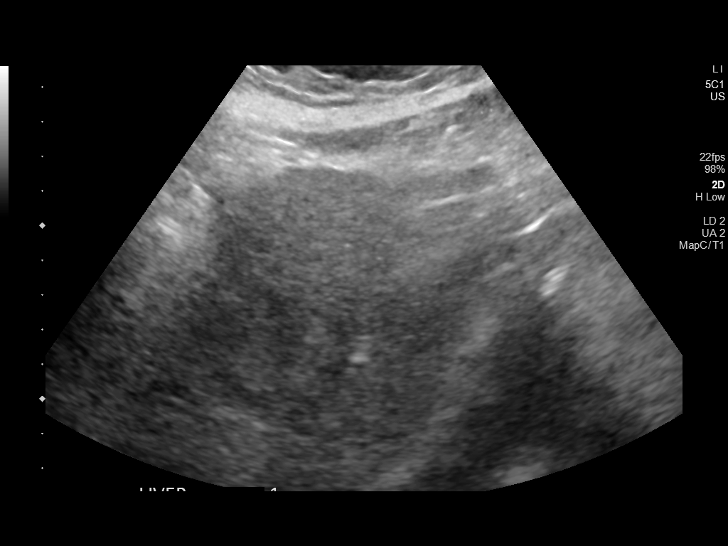
[im 15/20]
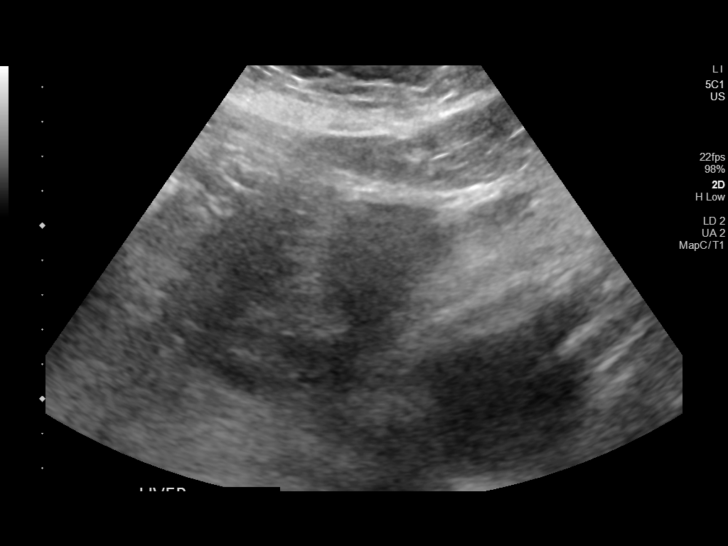
[im 17/20]
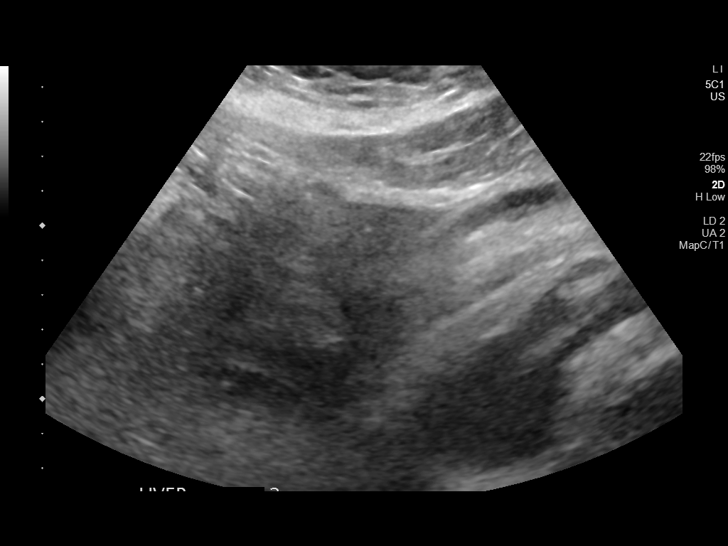
[im 18/20]
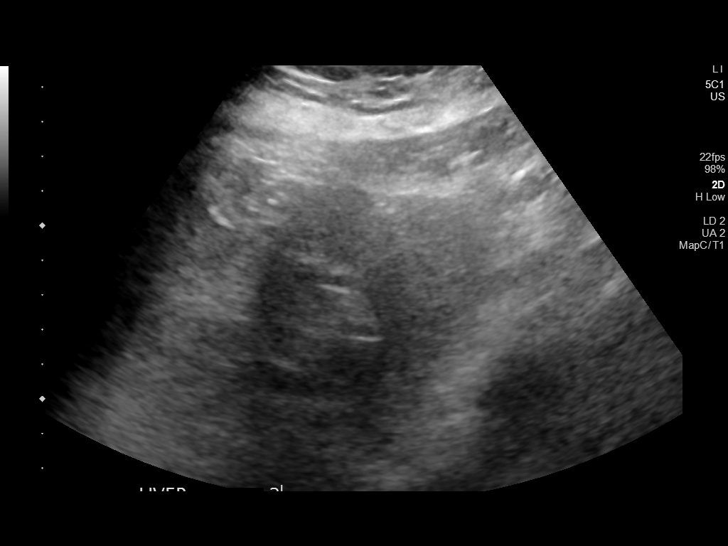
[im 20/20]
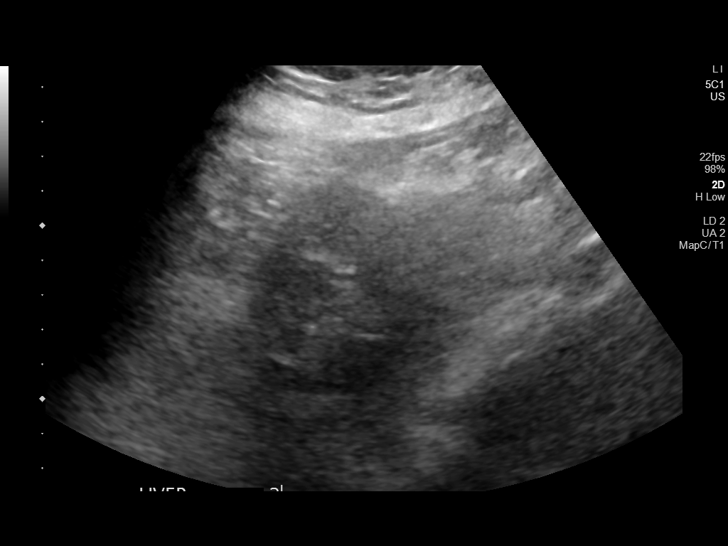

[13 of 20 positions shown; findings below may reference images not displayed]

EXAM:
ULTRASOUND PLACEMENT OF FIDUCIAL MARKERS

MEDICATIONS:
None.

ANESTHESIA/SEDATION:
Moderate (conscious) sedation was employed during this procedure. A
total of Versed 2 mg and Fentanyl 50 mcg was administered
intravenously.

Moderate Sedation Time: 13 minutes. The patient's level of
consciousness and vital signs were monitored continuously by
radiology nursing throughout the procedure under my direct
supervision.

FLUOROSCOPY TIME:  None.

COMPLICATIONS:
None immediate.

PROCEDURE:
Informed written consent was obtained from the patient after a
thorough discussion of the procedural risks, benefits and
alternatives. All questions were addressed. Maximal Sterile Barrier
Technique was utilized including caps, mask, sterile gowns, sterile
gloves, sterile drape, hand hygiene and skin antiseptic. A timeout
was performed prior to the initiation of the procedure.

The liver was interrogated with ultrasound. An ovoid heterogeneous
mass in the left hemi liver was successfully identified. The mass
measures approximately 4.5 x 3.7 cm.

Local anesthesia was attained by infiltration with 1% lidocaine.
Under real-time ultrasound guidance, a total of 3 fiducial markers
were individually placed, 1 at the medial margin of the mass, 1 at
the deep (posterior) margin of the mass, and 1 at the anterior
margin of the mass.
IMPRESSION: Successful placement of 3 metallic fiducial markers bracketing the
patient's known hepatocellular carcinoma.

## 2023-05-17 IMAGING — MR MR ABDOMEN WO/W CM
18 series · 48 of 48 positions shown · IV contrast (gadavist)
Comparison: 12/02/2020

CLINICAL DATA: Hepatocellular carcinoma, assess treatment response,
status post SBRT

EXAM:
MRI ABDOMEN WITHOUT AND WITH CONTRAST
TECHNIQUE: Multiplanar multisequence MR imaging of the abdomen was performed
both before and after the administration of intravenous contrast.
CONTRAST:  9mL GADAVIST GADOBUTROL 1 MMOL/ML IV SOLN

[Series 3: T2 · coronal · 6.0mm · 1.56mm/px · 2 of 40 slices shown (1 of 2)]
[im 1/40]
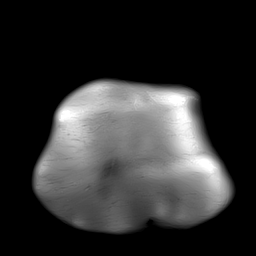
[im 40/40]
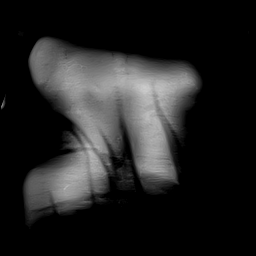

[Series 4: T2 fat-sat · axial · 6.0mm · 1.25mm/px · z∈[-134,+132]mm · 2 of 38 slices shown]
[im 1/38]
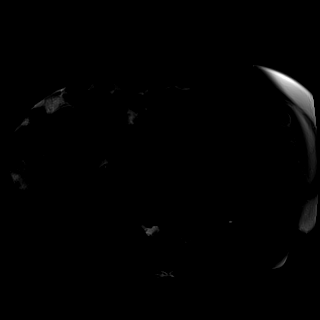
[im 38/38]
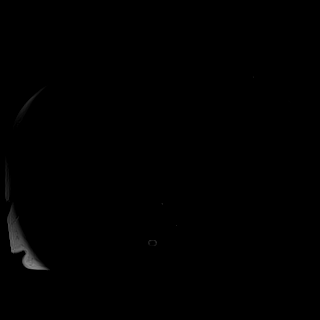

[Series 6: DWI · axial · 6.0mm · 1.49mm/px · z∈[-131,+149]mm · 4 of 80 slices shown (1 of 2)]
[im 1/80]
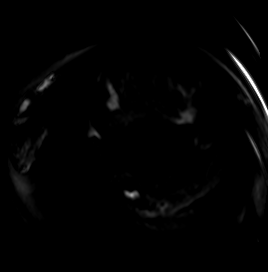
[im 27/80]
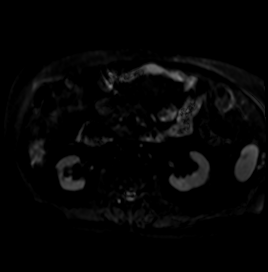
[im 53/80]
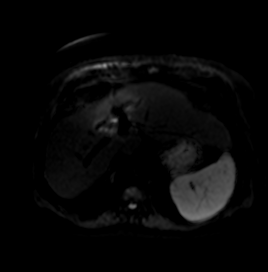
[im 80/80]
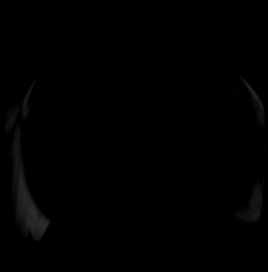

[Series 7: DWI · axial · 6.0mm · 1.49mm/px · z∈[-131,+149]mm · 2 of 40 slices shown (2 of 2)]
[im 1/40]
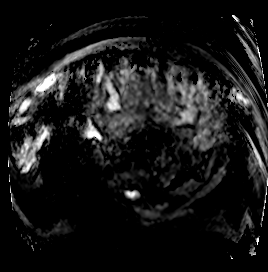
[im 40/40]
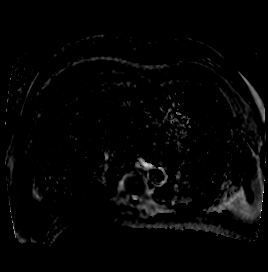

[Series 8: T1 · axial · 3.0mm · 1.25mm/px · z∈[-103,+133]mm · 3 of 80 slices shown (1 of 2)]
[im 1/80]
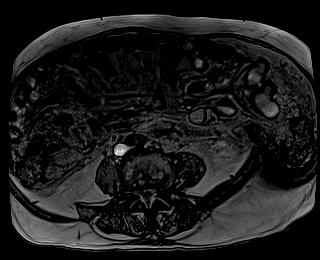
[im 40/80]
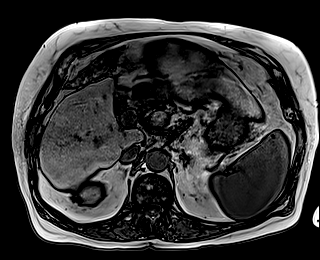
[im 80/80]
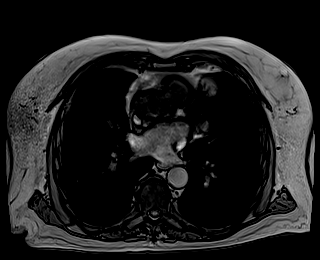

[Series 9: T1 · axial · 3.0mm · 1.25mm/px · z∈[-103,+133]mm · 3 of 80 slices shown (2 of 2)]
[im 1/80]
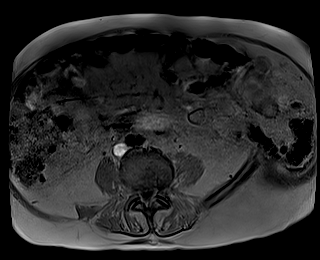
[im 40/80]
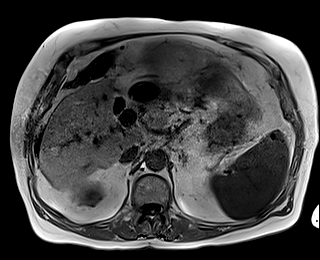
[im 80/80]
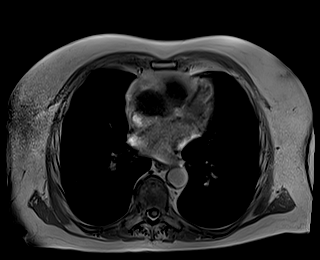

[Series 10: bSSFP · axial · 4.0mm · 0.84mm/px · z∈[-129,+139]mm · 2 of 68 slices shown]
[im 1/68]
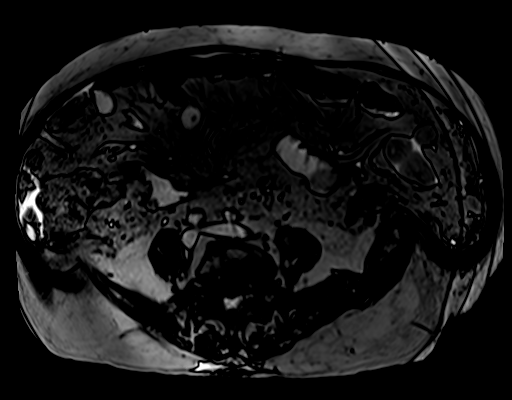
[im 68/68]
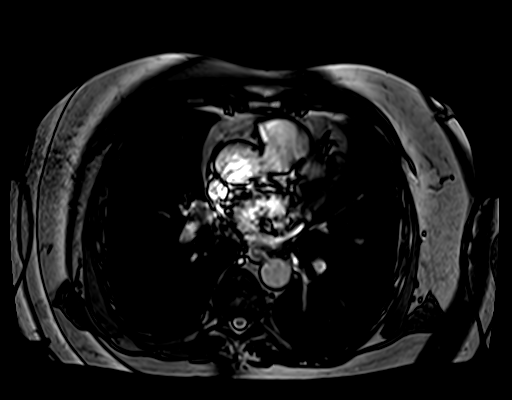

[Series 12: T1 dynamic · axial · 3.0mm · 1.25mm/px · z∈[-127,+134]mm · 3 of 88 slices shown (1 of 10)]
[im 1/88]
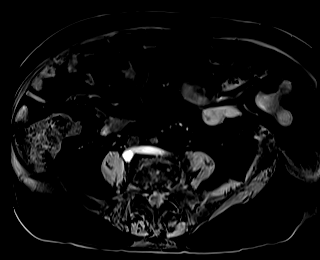
[im 44/88]
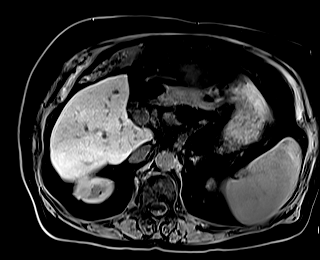
[im 88/88]
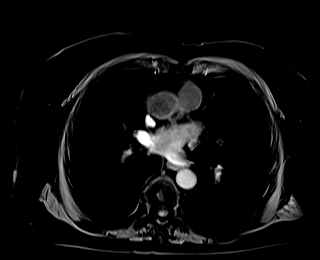

[Series 16: T1 dynamic · axial · 3.0mm · 1.25mm/px · z∈[-127,+134]mm · 3 of 88 slices shown (2 of 10)]
[im 1/88]
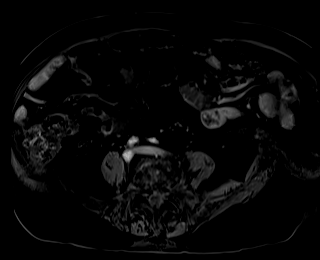
[im 44/88]
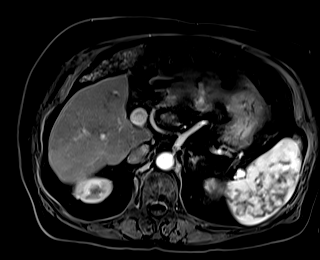
[im 88/88]
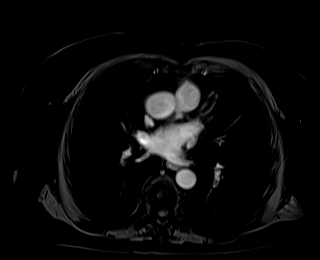

[Series 17: T1 dynamic · axial · 3.0mm · 1.25mm/px · z∈[-127,+134]mm · 3 of 88 slices shown (3 of 10)]
[im 1/88]
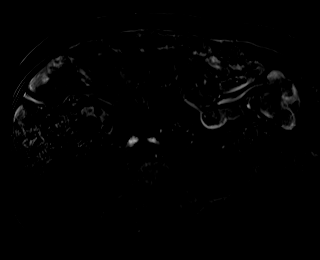
[im 44/88]
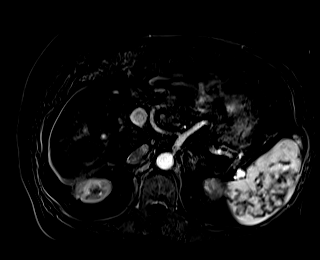
[im 88/88]
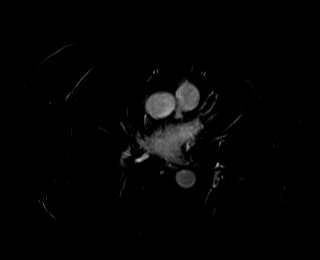

[Series 20: T1 dynamic · axial · 3.0mm · 1.25mm/px · z∈[-127,+134]mm · 3 of 88 slices shown (4 of 10)]
[im 1/88]
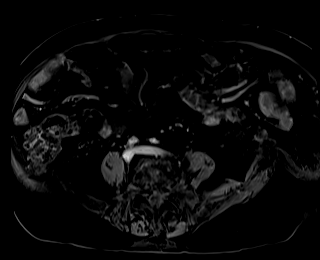
[im 44/88]
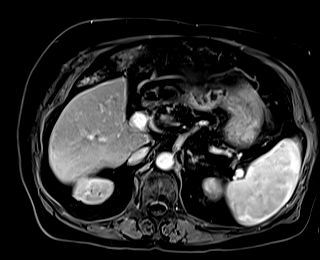
[im 88/88]
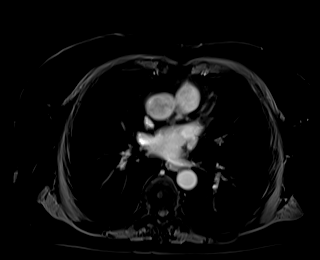

[Series 21: T1 dynamic · axial · 3.0mm · 1.25mm/px · z∈[-127,+134]mm · 3 of 88 slices shown (5 of 10)]
[im 1/88]
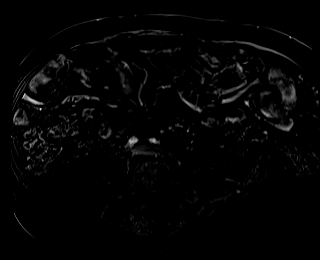
[im 44/88]
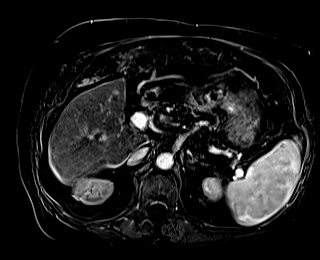
[im 88/88]
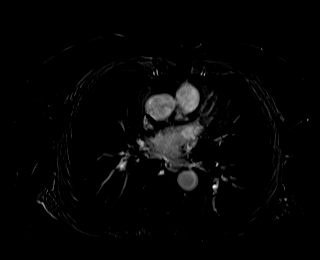

[Series 24: T1 dynamic · axial · 3.0mm · 1.25mm/px · z∈[-127,+134]mm · 3 of 88 slices shown (6 of 10)]
[im 1/88]
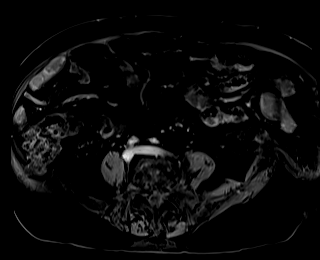
[im 44/88]
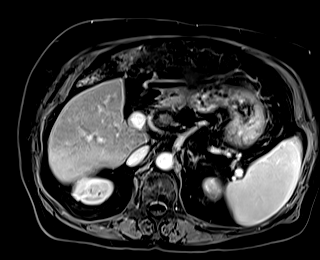
[im 88/88]
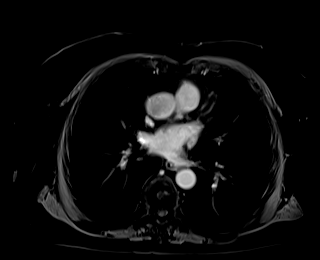

[Series 25: T1 dynamic · axial · 3.0mm · 1.25mm/px · z∈[-127,+134]mm · 3 of 88 slices shown (7 of 10)]
[im 1/88]
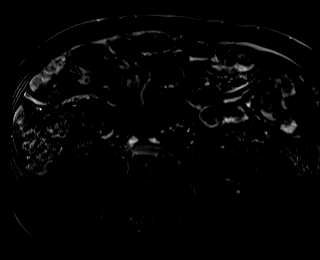
[im 44/88]
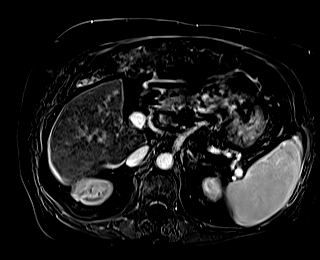
[im 88/88]
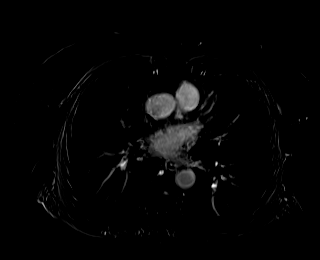

[Series 27: T1 dynamic · coronal · 5.0mm · 1.41mm/px · 2 of 56 slices shown (8 of 10)]
[im 1/56]
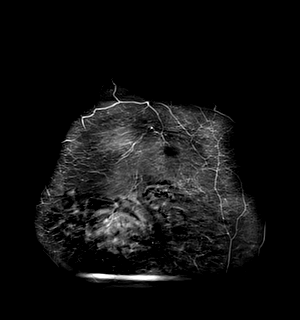
[im 56/56]
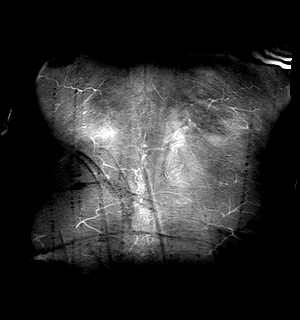

[Series 28: T2 · axial · 6.0mm · 1.56mm/px · 1 of 37 slices shown (2 of 2)]
[im 1/37]
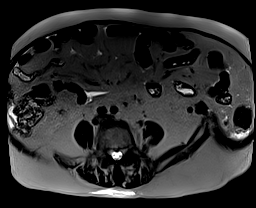

[Series 31: T1 dynamic · axial · 3.0mm · 1.25mm/px · z∈[-127,+134]mm · 3 of 88 slices shown (9 of 10)]
[im 1/88]
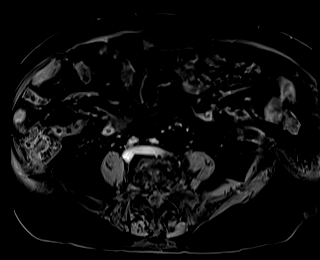
[im 44/88]
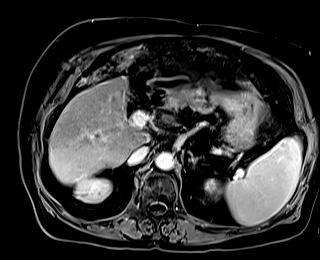
[im 88/88]
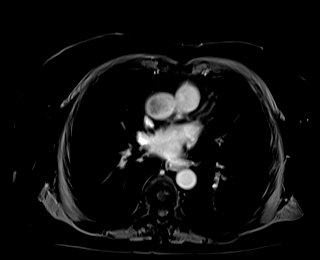

[Series 32: T1 dynamic · axial · 3.0mm · 1.25mm/px · z∈[-127,+134]mm · 3 of 88 slices shown (10 of 10)]
[im 1/88]
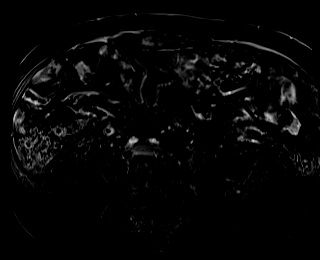
[im 44/88]
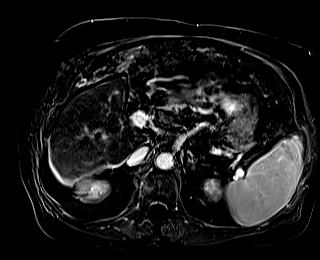
[im 88/88]
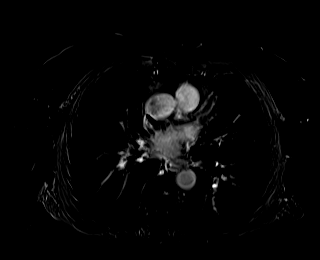

[48 of 48 positions shown; findings below may reference images not displayed]

FINDINGS: Lower chest: No acute findings.

Hepatobiliary: There is a redemonstrated lesion of the high anterior
midline liver, previously described as a left lobe mass, which is
substantially decreased in size compared to prior examination,
measuring 2.8 x 2.2 cm, previously 3.6 x 2.9 cm, with persistent rim
arterial hyperenhancement but diminished internal enhancement. There
is adjacent fibrosis of the liver parenchyma in keeping with
radiation therapy. There is an arterially hyperenhancing lesion in
the lateral left lobe of the liver, hepatic segment III, which
demonstrates washout and is increased in size compared to prior
examination, measuring 1.6 x 1.5 cm, previously 1.2 x 1.2 cm (series
16, image 40). Status post cholecystectomy. Postoperative biliary
ductal dilatation.

Pancreas: No mass, inflammatory changes, or other parenchymal
abnormality identified.

Spleen:  Within normal limits in size and appearance.

Adrenals/Urinary Tract: No masses identified. No evidence of
hydronephrosis.

Stomach/Bowel: Visualized portions within the abdomen are
unremarkable.

Vascular/Lymphatic: No pathologically enlarged lymph nodes
identified. No abdominal aortic aneurysm demonstrated. Aortic
atherosclerosis.

Other:  None.

Musculoskeletal: No suspicious bone lesions identified.
IMPRESSION: 1. Marked interval decrease in size of a treated lesion of the high
anterior midline liver, with persistent rim arterial
hyperenhancement but diminished internal enhancement. Findings are
consistent with continued treatment response, however with evidence
of persistent viable tumor.
2. Interval increase in size of a lesion in the lateral left lobe of
the liver, hepatic segment III, measuring 1.6 x 1.5 cm, previously
1.2 x 1.2 cm. This now demonstrates clear arterial phase
hyperenhancement and washout, and is consistent with hepatocellular
carcinoma. BLOOMFIELD category 5.
3. Status post cholecystectomy.
